# Patient Record
Sex: Male | Born: 1965
Health system: Southern US, Academic
[De-identification: ages and names within clinical notes are randomized; demographics above are authoritative.]

## PROBLEM LIST (undated history)

## (undated) ENCOUNTER — Encounter: Attending: Certified Registered" | Primary: Certified Registered"

## (undated) ENCOUNTER — Ambulatory Visit: Payer: PRIVATE HEALTH INSURANCE

## (undated) ENCOUNTER — Ambulatory Visit
Payer: BLUE CROSS/BLUE SHIELD | Attending: Student in an Organized Health Care Education/Training Program | Primary: Student in an Organized Health Care Education/Training Program

## (undated) ENCOUNTER — Ambulatory Visit

## (undated) ENCOUNTER — Telehealth: Attending: Certified Registered" | Primary: Certified Registered"

## (undated) ENCOUNTER — Telehealth

## (undated) ENCOUNTER — Ambulatory Visit: Payer: BLUE CROSS/BLUE SHIELD

## (undated) ENCOUNTER — Encounter: Attending: Infectious Disease | Primary: Infectious Disease

## (undated) ENCOUNTER — Encounter

## (undated) ENCOUNTER — Other Ambulatory Visit

## (undated) ENCOUNTER — Encounter
Attending: Student in an Organized Health Care Education/Training Program | Primary: Student in an Organized Health Care Education/Training Program

## (undated) ENCOUNTER — Ambulatory Visit: Payer: PRIVATE HEALTH INSURANCE | Attending: Family | Primary: Family

## (undated) ENCOUNTER — Encounter: Attending: Internal Medicine | Primary: Internal Medicine

## (undated) ENCOUNTER — Encounter: Attending: Gastroenterology | Primary: Gastroenterology

## (undated) ENCOUNTER — Encounter: Attending: Nephrology | Primary: Nephrology

## (undated) ENCOUNTER — Encounter: Attending: Registered" | Primary: Registered"

## (undated) ENCOUNTER — Ambulatory Visit
Payer: PRIVATE HEALTH INSURANCE | Attending: Student in an Organized Health Care Education/Training Program | Primary: Student in an Organized Health Care Education/Training Program

## (undated) ENCOUNTER — Telehealth: Attending: Vascular & Interventional Radiology | Primary: Vascular & Interventional Radiology

## (undated) ENCOUNTER — Telehealth: Payer: PRIVATE HEALTH INSURANCE

## (undated) ENCOUNTER — Encounter: Attending: Family | Primary: Family

## (undated) ENCOUNTER — Ambulatory Visit: Payer: BLUE CROSS/BLUE SHIELD | Attending: Podiatrist | Primary: Podiatrist

## (undated) ENCOUNTER — Ambulatory Visit: Payer: MEDICAID

## (undated) ENCOUNTER — Telehealth: Attending: Registered" | Primary: Registered"

## (undated) ENCOUNTER — Ambulatory Visit: Payer: BLUE CROSS/BLUE SHIELD | Attending: "Endocrinology | Primary: "Endocrinology

## (undated) ENCOUNTER — Inpatient Hospital Stay: Payer: BLUE CROSS/BLUE SHIELD

## (undated) ENCOUNTER — Encounter
Attending: Pharmacist Clinician (PhC)/ Clinical Pharmacy Specialist | Primary: Pharmacist Clinician (PhC)/ Clinical Pharmacy Specialist

## (undated) ENCOUNTER — Telehealth
Attending: Student in an Organized Health Care Education/Training Program | Primary: Student in an Organized Health Care Education/Training Program

## (undated) ENCOUNTER — Encounter: Payer: MEDICAID | Attending: Gastroenterology | Primary: Gastroenterology

## (undated) ENCOUNTER — Telehealth: Attending: Nephrology | Primary: Nephrology

## (undated) ENCOUNTER — Telehealth: Attending: Internal Medicine | Primary: Internal Medicine

## (undated) ENCOUNTER — Encounter: Attending: Podiatrist | Primary: Podiatrist

## (undated) ENCOUNTER — Ambulatory Visit: Payer: PRIVATE HEALTH INSURANCE | Attending: Nephrology | Primary: Nephrology

## (undated) ENCOUNTER — Encounter: Attending: Pharmacist | Primary: Pharmacist

## (undated) ENCOUNTER — Telehealth: Attending: Family | Primary: Family

## (undated) ENCOUNTER — Ambulatory Visit
Attending: Student in an Organized Health Care Education/Training Program | Primary: Student in an Organized Health Care Education/Training Program

## (undated) ENCOUNTER — Ambulatory Visit: Attending: Dermatology | Primary: Dermatology

## (undated) ENCOUNTER — Encounter: Attending: Surgery | Primary: Surgery

## (undated) ENCOUNTER — Ambulatory Visit: Payer: PRIVATE HEALTH INSURANCE | Attending: Pharmacist | Primary: Pharmacist

## (undated) DIAGNOSIS — I10 Essential (primary) hypertension: Secondary | ICD-10-CM

## (undated) DIAGNOSIS — N189 Chronic kidney disease, unspecified: Secondary | ICD-10-CM

## (undated) DIAGNOSIS — K7460 Unspecified cirrhosis of liver: Secondary | ICD-10-CM

## (undated) DIAGNOSIS — R011 Cardiac murmur, unspecified: Secondary | ICD-10-CM

## (undated) DIAGNOSIS — A419 Sepsis, unspecified organism: Secondary | ICD-10-CM

## (undated) DIAGNOSIS — G4730 Sleep apnea, unspecified: Secondary | ICD-10-CM

## (undated) HISTORY — PX: LIVER TRANSPLANT: SHX410

## (undated) HISTORY — PX: NO PAST SURGERIES: SHX2092

## (undated) MED ORDER — TESTOSTERONE 20.25 MG/1.25 GRAM (1.62 %) TRANSDERMAL GEL PUMP: 41 mg | g | Freq: Every day | 2 refills | 30 days

## (undated) MED ORDER — TESTOSTERONE 20.25 MG/1.25 GRAM (1.62 %) TRANSDERMAL GEL PUMP: 41 mg | g | Freq: Every day | 2 refills | 30 days | Status: CN

## (undated) MED ORDER — MYCOPHENOLATE SODIUM 180 MG TABLET,DELAYED RELEASE: 180 mg | tablet | Freq: Two times a day (BID) | 11 refills | 30 days | Status: CN

## (undated) MED ORDER — NOVOLOG FLEXPEN SUBQ: Freq: Three times a day (TID) | SUBCUTANEOUS | 0 days

## (undated) MED ORDER — MULTIVITAMIN TABLET: Freq: Every day | ORAL | 0 days

---
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
## 2008-09-07 ENCOUNTER — Ambulatory Visit: Payer: Self-pay | Admitting: Occupational Medicine

## 2008-12-07 ENCOUNTER — Ambulatory Visit: Payer: Self-pay | Admitting: Occupational Medicine

## 2008-12-07 DIAGNOSIS — L02419 Cutaneous abscess of limb, unspecified: Secondary | ICD-10-CM | POA: Insufficient documentation

## 2008-12-07 DIAGNOSIS — L03119 Cellulitis of unspecified part of limb: Secondary | ICD-10-CM

## 2008-12-07 DIAGNOSIS — K7460 Unspecified cirrhosis of liver: Secondary | ICD-10-CM | POA: Insufficient documentation

## 2008-12-07 LAB — CONVERTED CEMR LAB
Basophils Absolute: 0 10*3/uL (ref 0.0–0.1)
Basophils Relative: 0 % (ref 0–1)
Glucose, Urine, Semiquant: NEGATIVE
Ketones, urine, test strip: NEGATIVE
Lymphocytes Relative: 16 % (ref 12–46)
MCHC: 34.2 g/dL (ref 30.0–36.0)
Neutro Abs: 6.9 10*3/uL (ref 1.7–7.7)
Neutrophils Relative %: 83 % — ABNORMAL HIGH (ref 43–77)
Nitrite: NEGATIVE
RDW: 14.1 % (ref 11.5–15.5)
Specific Gravity, Urine: 1.025
WBC Urine, dipstick: NEGATIVE
pH: 7

## 2011-09-18 ENCOUNTER — Ambulatory Visit (INDEPENDENT_AMBULATORY_CARE_PROVIDER_SITE_OTHER): Payer: BC Managed Care – PPO | Admitting: Gastroenterology

## 2011-09-18 DIAGNOSIS — K7460 Unspecified cirrhosis of liver: Secondary | ICD-10-CM

## 2011-09-18 DIAGNOSIS — K7290 Hepatic failure, unspecified without coma: Secondary | ICD-10-CM

## 2011-09-18 DIAGNOSIS — K7682 Hepatic encephalopathy: Secondary | ICD-10-CM

## 2011-09-18 DIAGNOSIS — R945 Abnormal results of liver function studies: Secondary | ICD-10-CM

## 2011-09-18 DIAGNOSIS — R188 Other ascites: Secondary | ICD-10-CM

## 2011-09-18 DIAGNOSIS — K766 Portal hypertension: Secondary | ICD-10-CM

## 2011-10-16 ENCOUNTER — Ambulatory Visit (INDEPENDENT_AMBULATORY_CARE_PROVIDER_SITE_OTHER): Payer: BC Managed Care – PPO | Admitting: Gastroenterology

## 2011-10-16 DIAGNOSIS — K766 Portal hypertension: Secondary | ICD-10-CM

## 2011-10-16 DIAGNOSIS — R945 Abnormal results of liver function studies: Secondary | ICD-10-CM

## 2011-10-16 DIAGNOSIS — R188 Other ascites: Secondary | ICD-10-CM

## 2011-10-16 DIAGNOSIS — K7689 Other specified diseases of liver: Secondary | ICD-10-CM

## 2011-12-14 ENCOUNTER — Other Ambulatory Visit (HOSPITAL_COMMUNITY): Payer: Self-pay | Admitting: *Deleted

## 2011-12-18 ENCOUNTER — Encounter (HOSPITAL_COMMUNITY): Payer: Self-pay

## 2011-12-18 ENCOUNTER — Encounter (HOSPITAL_COMMUNITY)
Admission: RE | Admit: 2011-12-18 | Discharge: 2011-12-18 | Disposition: A | Discharge status: Home / Self Care | Payer: BC Managed Care – PPO | Source: Ambulatory Visit | Attending: Gastroenterology | Admitting: Gastroenterology

## 2011-12-18 DIAGNOSIS — K769 Liver disease, unspecified: Secondary | ICD-10-CM | POA: Insufficient documentation

## 2011-12-18 HISTORY — DX: Cardiac murmur, unspecified: R01.1

## 2011-12-18 HISTORY — DX: Sleep apnea, unspecified: G47.30

## 2011-12-18 HISTORY — DX: Unspecified cirrhosis of liver: K74.60

## 2011-12-18 HISTORY — DX: Essential (primary) hypertension: I10

## 2011-12-18 LAB — ABO/RH: ABO/RH(D): O NEG

## 2011-12-18 MED ORDER — SODIUM CHLORIDE 0.9 % IV SOLN
Freq: Once | INTRAVENOUS | Status: AC
  Administered 2011-12-18: 08:00:00 via INTRAVENOUS

## 2011-12-18 NOTE — Progress Notes (Signed)
3rd unit of FFP started donor number [id number]. Verified by Max Fickle RN. New y set added to IV.

## 2011-12-18 NOTE — Progress Notes (Signed)
Rate of ffp increased to 200cc

## 2011-12-18 NOTE — Discharge Instructions (Signed)
Refer to Blood Transfusion information sheet. Call your doctor for any problems or questions.

## 2011-12-18 NOTE — Progress Notes (Signed)
FFP started at 50cc/hr for 12.5cc.

## 2011-12-18 NOTE — Progress Notes (Signed)
Have been in contact with dentist office all day to co-ordinate time for extraction that has to be done today. Must be there by 4pm today for procedure. Will stop FFP to allow patient to get to facility .

## 2011-12-18 NOTE — Progress Notes (Signed)
Patient had to be in [place] at [facility]ist at [facility] no choice but to stop FFP .

## 2011-12-19 LAB — PREPARE FRESH FROZEN PLASMA
Unit division: 0
Unit division: 0

## 2015-02-20 ENCOUNTER — Emergency Department (HOSPITAL_COMMUNITY): Payer: BLUE CROSS/BLUE SHIELD

## 2015-02-20 ENCOUNTER — Emergency Department (HOSPITAL_COMMUNITY)
Admission: EM | Admit: 2015-02-20 | Discharge: 2015-02-20 | Disposition: A | Discharge status: Home / Self Care | Payer: BLUE CROSS/BLUE SHIELD | Attending: Emergency Medicine | Admitting: Emergency Medicine

## 2015-02-20 ENCOUNTER — Encounter (HOSPITAL_COMMUNITY): Payer: Self-pay | Admitting: Emergency Medicine

## 2015-02-20 ENCOUNTER — Other Ambulatory Visit: Payer: Self-pay

## 2015-02-20 DIAGNOSIS — R0602 Shortness of breath: Secondary | ICD-10-CM | POA: Insufficient documentation

## 2015-02-20 DIAGNOSIS — Z79899 Other long term (current) drug therapy: Secondary | ICD-10-CM | POA: Insufficient documentation

## 2015-02-20 DIAGNOSIS — Z8719 Personal history of other diseases of the digestive system: Secondary | ICD-10-CM | POA: Insufficient documentation

## 2015-02-20 DIAGNOSIS — M79601 Pain in right arm: Secondary | ICD-10-CM | POA: Diagnosis not present

## 2015-02-20 DIAGNOSIS — Z7982 Long term (current) use of aspirin: Secondary | ICD-10-CM | POA: Insufficient documentation

## 2015-02-20 DIAGNOSIS — M25521 Pain in right elbow: Secondary | ICD-10-CM | POA: Diagnosis not present

## 2015-02-20 DIAGNOSIS — Z8669 Personal history of other diseases of the nervous system and sense organs: Secondary | ICD-10-CM | POA: Insufficient documentation

## 2015-02-20 DIAGNOSIS — I10 Essential (primary) hypertension: Secondary | ICD-10-CM | POA: Insufficient documentation

## 2015-02-20 DIAGNOSIS — R011 Cardiac murmur, unspecified: Secondary | ICD-10-CM | POA: Diagnosis not present

## 2015-02-20 LAB — BASIC METABOLIC PANEL
ANION GAP: 7 (ref 5–15)
BUN: 19 mg/dL (ref 6–20)
CHLORIDE: 107 mmol/L (ref 101–111)
CO2: 24 mmol/L (ref 22–32)
Calcium: 8.7 mg/dL — ABNORMAL LOW (ref 8.9–10.3)
Creatinine, Ser: 1.33 mg/dL — ABNORMAL HIGH (ref 0.61–1.24)
GFR calc Af Amer: >60 mL/min (ref >60)
GLUCOSE: 133 mg/dL — AB (ref 65–99)
POTASSIUM: 4 mmol/L (ref 3.5–5.1)
Sodium: 138 mmol/L (ref 135–145)

## 2015-02-20 LAB — CBC WITH DIFFERENTIAL/PLATELET
BASOS ABS: 0 10*3/uL (ref 0.0–0.1)
Basophils Relative: 0 % (ref 0–1)
EOS PCT: 1 % (ref 0–5)
Eosinophils Absolute: 0.1 10*3/uL (ref 0.0–0.7)
HEMATOCRIT: 46.3 % (ref 39.0–52.0)
HEMOGLOBIN: 15.4 g/dL (ref 13.0–17.0)
LYMPHS ABS: 1 10*3/uL (ref 0.7–4.0)
LYMPHS PCT: 11 % — AB (ref 12–46)
MCH: 27.4 pg (ref 26.0–34.0)
MCHC: 33.3 g/dL (ref 30.0–36.0)
MCV: 82.2 fL (ref 78.0–100.0)
Monocytes Absolute: 0.6 10*3/uL (ref 0.1–1.0)
Monocytes Relative: 7 % (ref 3–12)
NEUTROS ABS: 7.4 10*3/uL (ref 1.7–7.7)
Neutrophils Relative %: 81 % — ABNORMAL HIGH (ref 43–77)
PLATELETS: 193 10*3/uL (ref 150–400)
RBC: 5.63 MIL/uL (ref 4.22–5.81)
RDW: 13.5 % (ref 11.5–15.5)
WBC: 9.2 10*3/uL (ref 4.0–10.5)

## 2015-02-20 MED ORDER — OXYCODONE-ACETAMINOPHEN 5-325 MG PO TABS
1.0000 | ORAL_TABLET | Freq: Once | ORAL | Status: AC
  Administered 2015-02-20: 1 via ORAL
  Filled 2015-02-20: qty 1

## 2015-02-20 NOTE — ED Provider Notes (Signed)
CSN: [id number]     Arrival date & time 02/20/15  1514 History   First MD Initiated Contact with Patient 02/20/15 1654     Chief Complaint  Patient presents with  . Arm Pain  . Shortness of Breath     (Consider location/radiation/quality/duration/timing/severity/associated sxs/prior Treatment) HPI 49 year old male who presents with right elbow pain. Started suddenly while at rest around 12AM, progressively worse. Denies associating trauma or exertional activity. Denies associated swelling to arm. Noticed that he was breathing more heavy with the pain, but does not truly have SOB (as stated in the CC). Denies chest pain, fevers, chills, nausea, vomiting, abdominal pain, diarrhea, or urinary complaints. No associating numbness or weakness.  Past Medical History  Diagnosis Date  . Hypertension   . Heart murmur   . Sleep apnea   . Cirrhosis    Past Surgical History  Procedure Laterality Date  . No past surgeries    . Liver transplant     History reviewed. No pertinent family history. Social History  Substance Use Topics  . Smoking status: Never Smoker   . Smokeless tobacco: None     Comment: quit smoking 2002  . Alcohol Use: No    Review of Systems 10/14 systems reviewed and are negative other than those stated in the HPI  Allergies  Review of patient's allergies indicates no known allergies.  Home Medications   Prior to Admission medications   Medication Sig Start Date End Date Taking? Authorizing Provider  aspirin EC 81 MG tablet Take 81 mg by mouth daily.   Yes Historical Provider, MD  Cholecalciferol (VITAMIN D) 2000 UNITS tablet Take 2,000 Units by mouth daily.   Yes Historical Provider, MD  mycophenolate (MYFORTIC) 180 MG EC tablet Take 360 mg by mouth 2 (two) times daily.   Yes Historical Provider, MD  tacrolimus (PROGRAF) 0.5 MG capsule Take 0.5-1 mg by mouth 2 (two) times daily. Takes 1mg  in the morning and 0.5mg  at night   Yes Historical Provider, MD   BP  166/83 mmHg  Pulse 76  Temp(Src) 98.6 F (37 C) (Oral)  Resp 18  SpO2 93% Physical Exam Physical Exam  Nursing note and vitals reviewed. Constitutional: Well developed, well nourished, non-toxic, and in no acute distress Head: Normocephalic and atraumatic.  Mouth/Throat: Oropharynx is clear and moist.  Neck: Normal range of motion. Neck supple.  Cardiovascular: Normal rate and regular rhythm.  No edema. +2 radial pulses bilaterally Pulmonary/Chest: Effort normal and breath sounds normal. No chest wall tenderness. Abdominal: Soft. There is no tenderness. There is no rebound and no guarding.  Musculoskeletal: Normal appearance of the right elbow, with overlying dry skin. Tender to palpation of the right elbow. No swelling, deformity, or overlying redness. Neurological: Alert, no facial droop, fluent speech, moves all extremities symmetrically. In tact sensation and motor of the radial, ulnar, and median nerves of the RUE Skin: Skin is warm and dry.  Psychiatric: Cooperative  ED Course  Procedures (including critical care time) Labs Review Labs Reviewed  CBC WITH DIFFERENTIAL/PLATELET - Abnormal; Notable for the following:    Neutrophils Relative % 81 (*)    Lymphocytes Relative 11 (*)    All other components within normal limits  BASIC METABOLIC PANEL - Abnormal; Notable for the following:    Glucose, Bld 133 (*)    Creatinine, Ser 1.33 (*)    Calcium 8.7 (*)    All other components within normal limits    Imaging Review Dg Chest 2  View  02/20/2015   CLINICAL DATA:  Severe right arm pain and shortness of breath for 1 day. Diabetes.  EXAM: CHEST  2 VIEW  COMPARISON:  11/02/2012  FINDINGS: Lateral view degraded by patient arm position. Midline trachea. Normal heart size and mediastinal contours.Sharp costophrenic angles. No pneumothorax. Calcified granuloma in the left lower lobe laterally. There may be lateral right upper lobe calcified granuloma as well.  IMPRESSION: No active  cardiopulmonary disease.   Electronically Signed   By: Abigail Miyamoto M.D.   On: 02/20/2015 16:17   Dg Elbow 2 Views Right  02/20/2015   CLINICAL DATA:  He right posterior elbow pain. No known injury. Symptoms began today.  EXAM: RIGHT ELBOW - 2 VIEW  COMPARISON:  None.  FINDINGS: There is no evidence of fracture, dislocation, or joint effusion. There is no evidence of arthropathy or other focal bone abnormality. Soft tissues are unremarkable.  IMPRESSION: Negative.   Electronically Signed   By: Rolm Baptise M.D.   On: 02/20/2015 18:24   I have personally reviewed and evaluated these images and lab results as part of my medical decision-making.   EKG Interpretation   Date/Time:  Saturday February 20 2015 15:13:22 EDT Ventricular Rate:  86 PR Interval:  210 QRS Duration: 78 QT Interval:  350 QTC Calculation: 418 R Axis:   -31 Text Interpretation:  Sinus rhythm with 1st degree A-V block with Fusion  complexes Left axis deviation TWI in the lateral precordial leads. No  prior EKG for comparison Confirmed by Alvin Rubano MD, Lilleigh Hechavarria [phone]) on 02/21/2015  12:26:35 AM      MDM   Final diagnoses:  Elbow pain, right     in short, this a 49 year old male with history of liver transplant who presents with right elbow pain. He is nontoxic and in no acute distress. Vital signs within normal limits. On exam he is a normal-appearing elbow, but with exquisite tenderness to palpation. He has normal range of motion of this joint,  And there is no evidence of swelling or overlying skin changes that would  Raise suspicion for infection. Extremity is neurovascularly intact distally. X-rays performed showing no acute processes. Although triage note documents that he has shortness of breath, he denies this is me and just reports that with the pain he was breathing more heavily. Given reproducible elbow pain, I do not believe that this is an anginal equivalent. He was initially given Percocet for pain control, with  resolution of his symptoms. He is felt appropriate for discharge home, and will follow up with his primary care provider. Strict return follow-up instructions reviewed. He expressed understanding of all discharge instructions for comfortable to plan of care.    Forde Dandy, MD 02/21/15 [phone]

## 2015-02-20 NOTE — Discharge Instructions (Signed)
Return without fail for worsening symptoms, including redness/swelling of the elbow, fevers, or any other symptoms concerning to you. Continue taking motrin or tylenol for pain control. Follow-up with your PCP.   Musculoskeletal Pain Musculoskeletal pain is muscle and boney aches and pains. These pains can occur in any part of the body. Your caregiver may treat you without knowing the cause of the pain. They may treat you if blood or urine tests, X-rays, and other tests were normal.  CAUSES There is often not a definite cause or reason for these pains. These pains may be caused by a type of germ (virus). The discomfort may also come from overuse. Overuse includes working out too hard when your body is not fit. Boney aches also come from weather changes. Bone is sensitive to atmospheric pressure changes. HOME CARE INSTRUCTIONS   Ask when your test results will be ready. Make sure you get your test results.  Only take over-the-counter or prescription medicines for pain, discomfort, or fever as directed by your caregiver. If you were given medications for your condition, do not drive, operate machinery or power tools, or sign legal documents for 24 hours. Do not drink alcohol. Do not take sleeping pills or other medications that may interfere with treatment.  Continue all activities unless the activities cause more pain. When the pain lessens, slowly resume normal activities. Gradually increase the intensity and duration of the activities or exercise.  During periods of severe pain, bed rest may be helpful. Lay or sit in any position that is comfortable.  Putting ice on the injured area.  Put ice in a bag.  Place a towel between your skin and the bag.  Leave the ice on for 15 to 20 minutes, 3 to 4 times a day.  Follow up with your caregiver for continued problems and no reason can be found for the pain. If the pain becomes worse or does not go away, it may be necessary to repeat tests or do  additional testing. Your caregiver may need to look further for a possible cause. SEEK IMMEDIATE MEDICAL CARE IF:  You have pain that is getting worse and is not relieved by medications.  You develop chest pain that is associated with shortness or breath, sweating, feeling sick to your stomach (nauseous), or throw up (vomit).  Your pain becomes localized to the abdomen.  You develop any new symptoms that seem different or that concern you. MAKE SURE YOU:   Understand these instructions.  Will watch your condition.  Will get help right away if you are not doing well or get worse. Document Released: 06/12/2005 Document Revised: 09/04/2011 Document Reviewed: 02/14/2013 [facility] Patient Information 2015 [place], [place]. This information is not intended to replace advice given to you by your health care provider. Make sure you discuss any questions you have with your health care provider.

## 2015-02-20 NOTE — ED Notes (Signed)
Pt refused w/c upon d/c. Ambulatory upon d/c in NAD, steady gait.

## 2015-02-20 NOTE — ED Notes (Signed)
Pt states that about an hour ago started having pain in right elbow that radiates down to right hand.  Pt states that he also having shob.  Pt states that he had liver transplant 3 years ago.

## 2015-07-09 ENCOUNTER — Encounter (HOSPITAL_COMMUNITY): Payer: Self-pay | Admitting: Emergency Medicine

## 2015-07-09 ENCOUNTER — Emergency Department (HOSPITAL_COMMUNITY)
Admission: EM | Admit: 2015-07-09 | Discharge: 2015-07-09 | Payer: BLUE CROSS/BLUE SHIELD | Attending: Emergency Medicine | Admitting: Emergency Medicine

## 2015-07-09 ENCOUNTER — Emergency Department (HOSPITAL_COMMUNITY): Payer: BLUE CROSS/BLUE SHIELD

## 2015-07-09 DIAGNOSIS — Z7982 Long term (current) use of aspirin: Secondary | ICD-10-CM | POA: Insufficient documentation

## 2015-07-09 DIAGNOSIS — R112 Nausea with vomiting, unspecified: Secondary | ICD-10-CM | POA: Insufficient documentation

## 2015-07-09 DIAGNOSIS — R000 Tachycardia, unspecified: Secondary | ICD-10-CM | POA: Diagnosis not present

## 2015-07-09 DIAGNOSIS — R1010 Upper abdominal pain, unspecified: Secondary | ICD-10-CM | POA: Diagnosis present

## 2015-07-09 DIAGNOSIS — Z794 Long term (current) use of insulin: Secondary | ICD-10-CM | POA: Insufficient documentation

## 2015-07-09 DIAGNOSIS — Z79899 Other long term (current) drug therapy: Secondary | ICD-10-CM | POA: Insufficient documentation

## 2015-07-09 DIAGNOSIS — R079 Chest pain, unspecified: Secondary | ICD-10-CM | POA: Diagnosis not present

## 2015-07-09 DIAGNOSIS — M549 Dorsalgia, unspecified: Secondary | ICD-10-CM | POA: Insufficient documentation

## 2015-07-09 DIAGNOSIS — Z944 Liver transplant status: Secondary | ICD-10-CM

## 2015-07-09 DIAGNOSIS — Z7984 Long term (current) use of oral hypoglycemic drugs: Secondary | ICD-10-CM | POA: Diagnosis not present

## 2015-07-09 DIAGNOSIS — R509 Fever, unspecified: Secondary | ICD-10-CM | POA: Diagnosis not present

## 2015-07-09 HISTORY — DX: Sepsis, unspecified organism: A41.9

## 2015-07-09 LAB — URINE MICROSCOPIC-ADD ON

## 2015-07-09 LAB — CBC
HCT: 53.3 % — ABNORMAL HIGH (ref 39.0–52.0)
HEMOGLOBIN: 18.4 g/dL — AB (ref 13.0–17.0)
MCH: 28 pg (ref 26.0–34.0)
MCHC: 34.5 g/dL (ref 30.0–36.0)
MCV: 81.1 fL (ref 78.0–100.0)
Platelets: 177 10*3/uL (ref 150–400)
RBC: 6.57 MIL/uL — AB (ref 4.22–5.81)
RDW: 13 % (ref 11.5–15.5)
WBC: 11.2 10*3/uL — ABNORMAL HIGH (ref 4.0–10.5)

## 2015-07-09 LAB — COMPREHENSIVE METABOLIC PANEL
ALBUMIN: 4.5 g/dL (ref 3.5–5.0)
ALT: 39 U/L (ref 17–63)
ANION GAP: 12 (ref 5–15)
AST: 32 U/L (ref 15–41)
Alkaline Phosphatase: 87 U/L (ref 38–126)
BUN: 28 mg/dL — ABNORMAL HIGH (ref 6–20)
CHLORIDE: 105 mmol/L (ref 101–111)
CO2: 22 mmol/L (ref 22–32)
CREATININE: 1.47 mg/dL — AB (ref 0.61–1.24)
Calcium: 9.6 mg/dL (ref 8.9–10.3)
GFR calc non Af Amer: 55 mL/min — ABNORMAL LOW (ref >60)
GLUCOSE: 238 mg/dL — AB (ref 65–99)
Potassium: 4.5 mmol/L (ref 3.5–5.1)
SODIUM: 139 mmol/L (ref 135–145)
Total Bilirubin: 1.5 mg/dL — ABNORMAL HIGH (ref 0.3–1.2)
Total Protein: 8 g/dL (ref 6.5–8.1)

## 2015-07-09 LAB — INFLUENZA PANEL BY PCR (TYPE A & B)
H1N1 flu by pcr: NOT DETECTED
INFLAPCR: NEGATIVE
INFLBPCR: NEGATIVE

## 2015-07-09 LAB — URINALYSIS, ROUTINE W REFLEX MICROSCOPIC
Bilirubin Urine: NEGATIVE
GLUCOSE, UA: 500 mg/dL — AB
Ketones, ur: NEGATIVE mg/dL
Leukocytes, UA: NEGATIVE
Nitrite: NEGATIVE
Protein, ur: 100 mg/dL — AB
SPECIFIC GRAVITY, URINE: 1.031 — AB (ref 1.005–1.030)
pH: 5.5 (ref 5.0–8.0)

## 2015-07-09 LAB — I-STAT CG4 LACTIC ACID, ED: LACTIC ACID, VENOUS: 2.68 mmol/L — AB (ref 0.5–2.0)

## 2015-07-09 LAB — I-STAT TROPONIN, ED
Troponin i, poc: 0.03 ng/mL (ref 0.00–0.08)
Troponin i, poc: 0.05 ng/mL (ref 0.00–0.08)

## 2015-07-09 LAB — CBG MONITORING, ED: GLUCOSE-CAPILLARY: 159 mg/dL — AB (ref 65–99)

## 2015-07-09 LAB — LIPASE, BLOOD: Lipase: 23 U/L (ref 11–51)

## 2015-07-09 MED ORDER — HYDROMORPHONE HCL 1 MG/ML IJ SOLN
1.0000 mg | Freq: Once | INTRAMUSCULAR | Status: AC
  Administered 2015-07-09: 1 mg via INTRAVENOUS
  Filled 2015-07-09: qty 1

## 2015-07-09 MED ORDER — ONDANSETRON HCL 4 MG/2ML IJ SOLN
4.0000 mg | Freq: Once | INTRAMUSCULAR | Status: AC
  Administered 2015-07-09: 4 mg via INTRAVENOUS
  Filled 2015-07-09: qty 2

## 2015-07-09 MED ORDER — IOHEXOL 300 MG/ML  SOLN
100.0000 mL | Freq: Once | INTRAMUSCULAR | Status: AC | PRN
  Administered 2015-07-09: 100 mL via INTRAVENOUS

## 2015-07-09 MED ORDER — VANCOMYCIN HCL 10 G IV SOLR
1500.0000 mg | Freq: Once | INTRAVENOUS | Status: AC
  Administered 2015-07-09: 1500 mg via INTRAVENOUS
  Filled 2015-07-09: qty 1500

## 2015-07-09 MED ORDER — VANCOMYCIN HCL IN DEXTROSE 1-5 GM/200ML-% IV SOLN: 1000.0000 mg | Freq: Two times a day (BID) | INTRAVENOUS | Status: DC

## 2015-07-09 MED ORDER — VANCOMYCIN HCL IN DEXTROSE 1-5 GM/200ML-% IV SOLN
1000.0000 mg | INTRAVENOUS | Status: AC
  Administered 2015-07-09: 1000 mg via INTRAVENOUS
  Filled 2015-07-09: qty 200

## 2015-07-09 MED ORDER — PIPERACILLIN-TAZOBACTAM 3.375 G IVPB: 3.3750 g | Freq: Three times a day (TID) | INTRAVENOUS | Status: DC

## 2015-07-09 MED ORDER — SODIUM CHLORIDE 0.9 % IV BOLUS (SEPSIS)
1000.0000 mL | Freq: Once | INTRAVENOUS | Status: AC
  Administered 2015-07-09: 1000 mL via INTRAVENOUS

## 2015-07-09 MED ORDER — PIPERACILLIN-TAZOBACTAM 3.375 G IVPB 30 MIN
3.3750 g | INTRAVENOUS | Status: AC
  Administered 2015-07-09: 3.375 g via INTRAVENOUS
  Filled 2015-07-09: qty 50

## 2015-07-09 MED ORDER — IOHEXOL 300 MG/ML  SOLN: 25.0000 mL | Freq: Once | INTRAMUSCULAR | Status: DC | PRN

## 2015-07-09 MED ORDER — HYDROMORPHONE HCL 2 MG/ML IJ SOLN
2.0000 mg | Freq: Once | INTRAMUSCULAR | Status: AC
  Administered 2015-07-09: 2 mg via INTRAVENOUS
  Filled 2015-07-09: qty 1

## 2015-07-09 NOTE — ED Notes (Signed)
Per pt, states burning in chest and sever lower back pain that started yesterday

## 2015-07-09 NOTE — ED Notes (Signed)
[facility] here to transfer pt to [facility].

## 2015-07-09 NOTE — ED Notes (Signed)
[facility] called--- report on pt provided.

## 2015-07-09 NOTE — ED Provider Notes (Addendum)
CSN: [id number]     Arrival date & time 07/09/15  1028 History   First MD Initiated Contact with Patient 07/09/15 1205     Chief Complaint  Patient presents with  . Abdominal Pain  . Back Pain     (Consider location/radiation/quality/duration/timing/severity/associated sxs/prior Treatment) HPI  50 year old male presents with back pain, abdominal pain, and vomiting. All started this morning. Patient has a history of a liver transplant is followed by [facility]. Patient states that originally last night the pain seemed to come from his upper abdomen and radiate into his chest as a burning sensation. Now however he has more back pain that seems to radiate around to his abdomen bilaterally. No urinary symptoms. Vomited once with some streaks of blood. Has a history of varices in the past, no issues since his liver transplant. Patient is on mycophenolate and tacrolimus. No known fevers.   Past Medical History  Diagnosis Date  . Hypertension   . Heart murmur   . Sleep apnea   . Cirrhosis [facility])    Past Surgical History  Procedure Laterality Date  . No past surgeries    . Liver transplant     No family history on file. Social History  Substance Use Topics  . Smoking status: Never Smoker   . Smokeless tobacco: None     Comment: quit smoking 2002  . Alcohol Use: No    Review of Systems  Constitutional: Negative for fever.  Respiratory: Negative for shortness of breath.   Cardiovascular: Positive for chest pain.  Gastrointestinal: Positive for nausea, vomiting and abdominal pain. Negative for diarrhea.  Genitourinary: Negative for dysuria.  Musculoskeletal: Positive for back pain.  All other systems reviewed and are negative.     Allergies  Insulin aspart prot & aspart  Home Medications   Prior to Admission medications   Medication Sig Start Date End Date Taking? Authorizing Provider  aspirin EC 81 MG tablet Take 81 mg by mouth daily.   Yes Historical Provider, MD   Cholecalciferol (VITAMIN D) 2000 UNITS tablet Take 2,000 Units by mouth daily.   Yes Historical Provider, MD  glimepiride (AMARYL) 2 MG tablet Take 2 mg by mouth daily. 06/17/15  Yes Historical Provider, MD  HUMALOG KWIKPEN 100 UNIT/ML KiwkPen Inject 5-20 Units into the skin 3 (three) times daily with meals as needed (high blood sugar). High blood sugar levels : 120-140=5 units 145-170= 10 units 170-199= 15 unitsand if above 200 use 20 units 06/23/15  Yes Historical Provider, MD  lisinopril (PRINIVIL,ZESTRIL) 10 MG tablet Take 10 mg by mouth daily. 05/19/15  Yes Historical Provider, MD  mycophenolate (MYFORTIC) 180 MG EC tablet Take 360 mg by mouth 2 (two) times daily.   Yes Historical Provider, MD  tacrolimus (PROGRAF) 0.5 MG capsule Take 0.5-1 mg by mouth 2 (two) times daily. Takes 1mg  in the morning and 0.5mg  at night   Yes Historical Provider, MD  Testosterone (ANDROGEL PUMP) 20.25 MG/ACT (1.62%) GEL Apply 1 application topically daily. Use 2 pumps once daily and apply to skin 06/22/15  Yes Historical Provider, MD   BP 150/77 mmHg  Pulse 115  Temp(Src) 102.5 F (39.2 C) (Rectal)  Resp 30  SpO2 95% Physical Exam  Constitutional: He is oriented to person, place, and time. He appears well-developed and well-nourished.  HENT:  Head: Normocephalic and atraumatic.  Right Ear: External ear normal.  Left Ear: External ear normal.  Nose: Nose normal.  Eyes: Right eye exhibits no discharge. Left eye exhibits no discharge.  Neck: Neck supple.  Cardiovascular: Regular rhythm, normal heart sounds and intact distal pulses.  Tachycardia present.   Pulmonary/Chest: Effort normal and breath sounds normal.  Abdominal: Soft. There is tenderness (diffuse, worse in upper abdomen).  Musculoskeletal: He exhibits no edema.  Neurological: He is alert and oriented to person, place, and time.  Skin: Skin is warm and dry.  Nursing note and vitals reviewed.   ED Course  Procedures (including critical care  time) Labs Review Labs Reviewed  COMPREHENSIVE METABOLIC PANEL - Abnormal; Notable for the following:    Glucose, Bld 238 (*)    BUN 28 (*)    Creatinine, Ser 1.47 (*)    Total Bilirubin 1.5 (*)    GFR calc non Af Amer 55 (*)    All other components within normal limits  CBC - Abnormal; Notable for the following:    WBC 11.2 (*)    RBC 6.57 (*)    Hemoglobin 18.4 (*)    HCT 53.3 (*)    All other components within normal limits  URINALYSIS, ROUTINE W REFLEX MICROSCOPIC (NOT AT [facility]) - Abnormal; Notable for the following:    Color, Urine AMBER (*)    Specific Gravity, Urine 1.031 (*)    Glucose, UA 500 (*)    Hgb urine dipstick SMALL (*)    Protein, ur 100 (*)    All other components within normal limits  URINE MICROSCOPIC-ADD ON - Abnormal; Notable for the following:    Squamous Epithelial / LPF 0-5 (*)    Bacteria, UA FEW (*)    All other components within normal limits  I-STAT CG4 LACTIC ACID, ED - Abnormal; Notable for the following:    Lactic Acid, Venous 2.68 (*)    All other components within normal limits  CULTURE, BLOOD (ROUTINE X 2)  CULTURE, BLOOD (ROUTINE X 2)  LIPASE, BLOOD  INFLUENZA PANEL BY PCR (TYPE A & B, H1N1)  I-STAT TROPOININ, ED  I-STAT TROPOININ, ED    Imaging Review Ct Abdomen Pelvis W Contrast  07/09/2015  CLINICAL DATA:  Severe low back and abdominal pain today. History of cirrhosis and liver transplant. EXAM: CT ABDOMEN AND PELVIS WITH CONTRAST TECHNIQUE: Multidetector CT imaging of the abdomen and pelvis was performed using the standard protocol following bolus administration of intravenous contrast. CONTRAST:  148mL OMNIPAQUE IOHEXOL 300 MG/ML  SOLN COMPARISON:  11/02/2012 FINDINGS: Lower chest: The lung bases are clear of acute process. Calcified granulomas are noted bilaterally. No pleural effusion. The heart is normal in size. No pericardial effusion. Suspect an intraatrial lipoma on the first image. The distal esophagus is grossly normal.  Hepatobiliary: The transplanted liver has a slightly irregular contour but no lesions are identified. No intrahepatic biliary dilatation. Pancreas: No mass, inflammation or ductal dilatation. Spleen: Upper limits of normal in size. No focal lesions. Rim calcified splenic artery aneurysm noted near the splenic hilum. It measures 22 mm. No change since prior study. Adrenals/Urinary Tract: The adrenal glands and kidneys are unremarkable. Stomach/Bowel: The stomach, duodenum, small bowel and colon are unremarkable. No inflammatory changes, mass lesions or obstructive findings. The terminal ileum is normal. The appendix is normal. Vascular/Lymphatic: Extensive portal venous collateral vessels are noted likely related to prior cirrhosis. No mesenteric or retroperitoneal mass or adenopathy. The aorta is normal in caliber. The branch vessels are patent. Other: The bladder, prostate gland and seminal vesicles are unremarkable. No pelvic mass or adenopathy. No free pelvic fluid collections. No inguinal mass or adenopathy. Small periumbilical abdominal wall  hernia containing fat. Musculoskeletal: No significant bony findings. IMPRESSION: 1. Stable surgical changes from a liver transplant. No hepatic lesions or intrahepatic biliary dilatation. 2. Extensive portal venous collateral vessels. 3. No acute abdominal/pelvic findings, mass lesions or adenopathy. Electronically Signed   By: Marijo Sanes M.D.   On: 07/09/2015 15:01   Dg Chest Port 1 View  07/09/2015  CLINICAL DATA:  Shortness of breath with back pain and anxiety. EXAM: PORTABLE CHEST 1 VIEW COMPARISON:  02/20/2015 and 11/02/2012 FINDINGS: The lungs are adequately inflated without consolidation or effusion. Small calcified granuloma over the left CP angle unchanged. Cardiomediastinal silhouette and remainder of the exam is unchanged. IMPRESSION: No active disease. Electronically Signed   By: Marin Olp M.D.   On: 07/09/2015 13:22   I have personally reviewed and  evaluated these images and lab results as part of my medical decision-making.   EKG Interpretation   Date/Time:  Friday July 09 2015 10:57:33 EST Ventricular Rate:  132 PR Interval:  182 QRS Duration: 75 QT Interval:  261 QTC Calculation: 387 R Axis:   -12 Text Interpretation:  Sinus tachycardia Anteroseptal infarct, old Abnormal  T, consider ischemia, lateral leads Baseline wander in lead(s) V2 rate is  faster compared to Aug 2016 Confirmed by Vanessa Alesi  MD, [place] [phone]) on  07/09/2015 2:58:52 PM      MDM   Final diagnoses:  Fever in adult  Liver transplant status ([place])    Patient found to be febrile here with a rectal temp of 102.5. Unclear exactly where this is coming from given his nonspecific findings on exam. CT of abdomen shows no acute pathology. His back pain is nonfocal and not in the midline. Very low suspicion for a spinal emergency. Given vomiting, now diarrhea in the emergency department, and his pain, this could be viral in etiology. Discussed with [facility] hepatology, Dr. Marene Lenz, who would like patient transferred to the [facility] emergency department for likely admission. Recommends flu swab, no treatment unless positive. Discussed with ED attending at [facility], Dr. Ron Parker, who accepts patient in transfer.    Sherwood Gambler, MD 07/09/15 [id number]  Sherwood Gambler, MD 07/09/15 [phone]

## 2015-07-09 NOTE — ED Notes (Signed)
MD at bedside. 

## 2015-07-09 NOTE — ED Notes (Signed)
Patient transported to CT 

## 2015-07-09 NOTE — ED Notes (Signed)
Ginger ale given

## 2015-07-09 NOTE — ED Notes (Signed)
[facility] TRANSFER CENTER RESPONSE PERSON Yukari Flax CONTACT NUMBER [phone]  CHARGE RN [phone]- PLEASE CALL WHEN [facility] LEAVES WITH PT

## 2015-07-09 NOTE — Progress Notes (Signed)
ANTIBIOTIC CONSULT NOTE - INITIAL  Pharmacy Consult for vancomycin, Zosyn Indication: rule out sepsis  Allergies  Allergen Reactions  . Insulin Aspart Prot & Aspart Other (See Comments)    Burning on injection and skin reaction.      Patient Measurements: Height: 5\' 10"  (177.8 cm) Weight: 290 lb (131.543 kg) IBW/kg (Calculated) : 73  Vital Signs: Temp: 102.5 F (39.2 C) (01/13 1222) Temp Source: Rectal (01/13 1222) BP: 150/77 mmHg (01/13 1213) Pulse Rate: 115 (01/13 1213) Intake/Output from previous day:   Intake/Output from this shift:    Labs:  Recent Labs  07/09/15 1126  WBC 11.2*  HGB 18.4*  PLT 177  CREATININE 1.47*   Estimated Creatinine Clearance: 84.7 mL/min (by C-G formula based on Cr of 1.47). No results for input(s): VANCOTROUGH, VANCOPEAK, VANCORANDOM, GENTTROUGH, GENTPEAK, GENTRANDOM, TOBRATROUGH, TOBRAPEAK, TOBRARND, AMIKACINPEAK, AMIKACINTROU, AMIKACIN in the last 72 hours.   Microbiology: No results found for this or any previous visit (from the past 720 hour(s)).  Medical History: Past Medical History  Diagnosis Date  . Hypertension   . Heart murmur   . Sleep apnea   . Cirrhosis ([place])     Medications:  Scheduled:  .  HYDROmorphone (DILAUDID) injection  1 mg Intravenous Once  . ondansetron (ZOFRAN) IV  4 mg Intravenous Once   Infusions:  . piperacillin-tazobactam    . sodium chloride    . sodium chloride    . vancomycin     Assessment: 50 yo male presents to ER with CC burning in chest and severe lower back and abdominal pain. PMH includes cirrhosis s/p liver transplant currently on antirejection meds Prograf and Myfortic. To start vancomycin and Zosyn for rule out sepsis. Patient febrile, slightly elevated WBC and SCr 1.47 with estimated CrCl N of 63  Goal of Therapy:  Vancomycin trough level 15-20 mcg/ml  Plan:  1) Vancomycin 1g IV x 1 then 1500mg  IV x 1 to = 2500mg  total loading dose. Thereafter, start vancomycin 1g IV q12 per  current weight and renal function 2) Zosyn 3.375g IV q8 (extended interval infusion) for CrCl > 20 ml/min   Adrian Saran, PharmD, BCPS Pager [phone] 07/09/2015 1:15 PM

## 2015-07-09 NOTE — ED Notes (Signed)
RN starting IV -  Will draw labs and 1st set of cultures, will draw 2nd set after portable x-ray is complete. (X-ray waiting outside door for RN to finish IV)

## 2015-07-09 NOTE — ED Notes (Signed)
Notified [place] - Lactic 2.68

## 2015-07-09 NOTE — ED Notes (Signed)
MD at bedside. EDP [place] PRESENT

## 2015-07-09 NOTE — ED Notes (Signed)
Oral contrast complete 

## 2015-07-14 LAB — CULTURE, BLOOD (ROUTINE X 2)
CULTURE: NO GROWTH
CULTURE: NO GROWTH

## 2015-09-15 ENCOUNTER — Encounter (HOSPITAL_BASED_OUTPATIENT_CLINIC_OR_DEPARTMENT_OTHER): Payer: Self-pay | Admitting: Emergency Medicine

## 2015-09-15 ENCOUNTER — Emergency Department (HOSPITAL_BASED_OUTPATIENT_CLINIC_OR_DEPARTMENT_OTHER)
Admission: EM | Admit: 2015-09-15 | Discharge: 2015-09-15 | Disposition: A | Discharge status: Home / Self Care | Payer: BLUE CROSS/BLUE SHIELD | Attending: Emergency Medicine | Admitting: Emergency Medicine

## 2015-09-15 ENCOUNTER — Emergency Department (HOSPITAL_BASED_OUTPATIENT_CLINIC_OR_DEPARTMENT_OTHER): Payer: BLUE CROSS/BLUE SHIELD

## 2015-09-15 DIAGNOSIS — Z8719 Personal history of other diseases of the digestive system: Secondary | ICD-10-CM | POA: Insufficient documentation

## 2015-09-15 DIAGNOSIS — R21 Rash and other nonspecific skin eruption: Secondary | ICD-10-CM | POA: Diagnosis present

## 2015-09-15 DIAGNOSIS — Z79899 Other long term (current) drug therapy: Secondary | ICD-10-CM | POA: Diagnosis not present

## 2015-09-15 DIAGNOSIS — L03316 Cellulitis of umbilicus: Secondary | ICD-10-CM | POA: Insufficient documentation

## 2015-09-15 DIAGNOSIS — E119 Type 2 diabetes mellitus without complications: Secondary | ICD-10-CM | POA: Diagnosis not present

## 2015-09-15 DIAGNOSIS — Z8669 Personal history of other diseases of the nervous system and sense organs: Secondary | ICD-10-CM | POA: Diagnosis not present

## 2015-09-15 DIAGNOSIS — R011 Cardiac murmur, unspecified: Secondary | ICD-10-CM | POA: Diagnosis not present

## 2015-09-15 DIAGNOSIS — I10 Essential (primary) hypertension: Secondary | ICD-10-CM | POA: Insufficient documentation

## 2015-09-15 DIAGNOSIS — Z7982 Long term (current) use of aspirin: Secondary | ICD-10-CM | POA: Insufficient documentation

## 2015-09-15 LAB — COMPREHENSIVE METABOLIC PANEL
ALBUMIN: 4.2 g/dL (ref 3.5–5.0)
ALK PHOS: 78 U/L (ref 38–126)
ALT: 43 U/L (ref 17–63)
ANION GAP: 10 (ref 5–15)
AST: 32 U/L (ref 15–41)
BILIRUBIN TOTAL: 0.8 mg/dL (ref 0.3–1.2)
BUN: 35 mg/dL — AB (ref 6–20)
CALCIUM: 9.1 mg/dL (ref 8.9–10.3)
CO2: 24 mmol/L (ref 22–32)
CREATININE: 1.35 mg/dL — AB (ref 0.61–1.24)
Chloride: 100 mmol/L — ABNORMAL LOW (ref 101–111)
GFR calc Af Amer: >60 mL/min (ref >60)
GFR calc non Af Amer: >60 mL/min (ref >60)
GLUCOSE: 137 mg/dL — AB (ref 65–99)
Potassium: 4.2 mmol/L (ref 3.5–5.1)
Sodium: 134 mmol/L — ABNORMAL LOW (ref 135–145)
TOTAL PROTEIN: 7.9 g/dL (ref 6.5–8.1)

## 2015-09-15 LAB — CBC WITH DIFFERENTIAL/PLATELET
BASOS PCT: 0 %
Basophils Absolute: 0 10*3/uL (ref 0.0–0.1)
Eosinophils Absolute: 0.2 10*3/uL (ref 0.0–0.7)
Eosinophils Relative: 2 %
HEMATOCRIT: 50.5 % (ref 39.0–52.0)
HEMOGLOBIN: 17.1 g/dL — AB (ref 13.0–17.0)
LYMPHS ABS: 1.5 10*3/uL (ref 0.7–4.0)
Lymphocytes Relative: 22 %
MCH: 27.4 pg (ref 26.0–34.0)
MCHC: 33.9 g/dL (ref 30.0–36.0)
MCV: 80.8 fL (ref 78.0–100.0)
MONOS PCT: 7 %
Monocytes Absolute: 0.5 10*3/uL (ref 0.1–1.0)
NEUTROS ABS: 4.9 10*3/uL (ref 1.7–7.7)
NEUTROS PCT: 69 %
Platelets: 259 10*3/uL (ref 150–400)
RBC: 6.25 MIL/uL — AB (ref 4.22–5.81)
RDW: 13.7 % (ref 11.5–15.5)
WBC: 7.1 10*3/uL (ref 4.0–10.5)

## 2015-09-15 LAB — CBG MONITORING, ED: Glucose-Capillary: 141 mg/dL — ABNORMAL HIGH (ref 65–99)

## 2015-09-15 MED ORDER — HYDROCODONE-ACETAMINOPHEN 5-325 MG PO TABS
1.0000 | ORAL_TABLET | Freq: Once | ORAL | Status: AC
  Administered 2015-09-15: 1 via ORAL
  Filled 2015-09-15: qty 1

## 2015-09-15 MED ORDER — HYDROCODONE-ACETAMINOPHEN 5-325 MG PO TABS: 1.0000 | ORAL_TABLET | ORAL | Status: DC | PRN

## 2015-09-15 MED ORDER — CLINDAMYCIN HCL 150 MG PO CAPS
300.0000 mg | ORAL_CAPSULE | Freq: Once | ORAL | Status: AC
  Administered 2015-09-15: 300 mg via ORAL
  Filled 2015-09-15: qty 2

## 2015-09-15 MED ORDER — CLINDAMYCIN HCL 300 MG PO CAPS: 300.0000 mg | ORAL_CAPSULE | Freq: Four times a day (QID) | ORAL | Status: DC

## 2015-09-15 MED ORDER — IOPAMIDOL (ISOVUE-300) INJECTION 61%
100.0000 mL | Freq: Once | INTRAVENOUS | Status: AC | PRN
  Administered 2015-09-15: 100 mL via INTRAVENOUS

## 2015-09-15 NOTE — ED Notes (Signed)
Pt returned from CT °

## 2015-09-15 NOTE — ED Notes (Signed)
Pt requesting pain medication, Dr. Lita Mains aware and would like to wait for CT results

## 2015-09-15 NOTE — ED Notes (Signed)
Patient transported to CT 

## 2015-09-15 NOTE — ED Notes (Signed)
Pt verbalizes understanding of d/c instructions and denies any further needs at this time. 

## 2015-09-15 NOTE — ED Provider Notes (Signed)
CSN: [id number]     Arrival date & time 09/15/15  1823 History  By signing my name below, I, Wayne Parker, attest that this documentation has been prepared under the direction and in the presence of [name], MD. Electronically Signed: Stephania Parker, ED Scribe. 09/15/2015. 9:20 PM.     Chief Complaint  Patient presents with  . Rash   The history is provided by the patient. No language interpreter was used.   HPI Comments: Wayne Parker is a 50 y.o. male with a history of DM, hypertension, cirrhosis, sepsis, and liver transplant, who presents to the Emergency Department complaining of gradual-onset, constant, painful rash with drainage on his bellybutton that began 4-5 days ago. Patient states he has been cleaning the area constantly with soap and water, with no relief. This is a new problem. Patient denies a history of abscesses. Patient notes his glucose readings have been controlled lately, in the range of 90's and 100's. He denies fever, chills, nausea, or vomiting.    Past Medical History  Diagnosis Date  . Hypertension   . Heart murmur   . Sleep apnea   . Cirrhosis ([place])   . Sepsis [facility])    Past Surgical History  Procedure Laterality Date  . No past surgeries    . Liver transplant     History reviewed. No pertinent family history. Social History  Substance Use Topics  . Smoking status: Never Smoker   . Smokeless tobacco: Never Used     Comment: quit smoking 2002  . Alcohol Use: No    Review of Systems  Constitutional: Negative for fever and chills.  Gastrointestinal: Positive for abdominal pain. Negative for nausea and vomiting.  Musculoskeletal: Negative for myalgias and back pain.  Skin: Positive for rash.  Neurological: Negative for dizziness, weakness, light-headedness, numbness and headaches.  All other systems reviewed and are negative.   Allergies  Insulin aspart prot & aspart  Home Medications   Prior to Admission medications   Medication Sig Start  Date End Date Taking? Authorizing Provider  aspirin EC 81 MG tablet Take 81 mg by mouth daily.    Historical Provider, MD  Cholecalciferol (VITAMIN D) 2000 UNITS tablet Take 2,000 Units by mouth daily.    Historical Provider, MD  clindamycin (CLEOCIN) 300 MG capsule Take 1 capsule (300 mg total) by mouth 4 (four) times daily. X 7 days 09/15/15   [name], MD  glimepiride (AMARYL) 2 MG tablet Take 2 mg by mouth daily. 06/17/15   Historical Provider, MD  HUMALOG KWIKPEN 100 UNIT/ML KiwkPen Inject 5-20 Units into the skin 3 (three) times daily with meals as needed (high blood sugar). High blood sugar levels : 120-140=5 units 145-170= 10 units 170-199= 15 unitsand if above 200 use 20 units 06/23/15   Historical Provider, MD  HYDROcodone-acetaminophen (NORCO) 5-325 MG tablet Take 1-2 tablets by mouth every 4 (four) hours as needed for severe pain. 09/15/15   [name], MD  lisinopril (PRINIVIL,ZESTRIL) 10 MG tablet Take 10 mg by mouth daily. 05/19/15   Historical Provider, MD  mycophenolate (MYFORTIC) 180 MG EC tablet Take 360 mg by mouth 2 (two) times daily.    Historical Provider, MD  tacrolimus (PROGRAF) 0.5 MG capsule Take 0.5-1 mg by mouth 2 (two) times daily. Takes 1mg  in the morning and 0.5mg  at night    Historical Provider, MD  Testosterone (ANDROGEL PUMP) 20.25 MG/ACT (1.62%) GEL Apply 1 application topically daily. Use 2 pumps once daily and apply to skin 06/22/15  Historical Provider, MD   BP 121/78 mmHg  Pulse 67  Temp(Src) 98.1 F (36.7 C) (Oral)  Resp 18  Ht 5\' 8"  (1.727 m)  Wt 270 lb (122.471 kg)  BMI 41.06 kg/m2  SpO2 96% Physical Exam  Constitutional: He is oriented to person, place, and time. He appears well-developed and well-nourished. No distress.  HENT:  Head: Normocephalic and atraumatic.  Mouth/Throat: Oropharynx is clear and moist.  Eyes: Conjunctivae and EOM are normal. Pupils are equal, round, and reactive to light.  Neck: Normal range of motion. Neck  supple. No tracheal deviation present.  Cardiovascular: Normal rate and regular rhythm.   Pulmonary/Chest: Effort normal and breath sounds normal. No respiratory distress. He has no wheezes. He has no rales.  Abdominal: Soft. Bowel sounds are normal. He exhibits no distension and no mass. There is tenderness. There is no rebound and no guarding.  Patient has pustular and ulcerated rash around the umbilicus. There is dried yellow crust surrounding the opening. Patient with deep umbilicus and tenderness and fluctuance at the base. Unable to visualize due to abdominal girth.  Musculoskeletal: Normal range of motion. He exhibits no edema or tenderness.  Neurological: He is alert and oriented to person, place, and time.  Skin: Skin is warm and dry. No rash noted. No erythema.  Psychiatric: He has a normal mood and affect. His behavior is normal.  Nursing note and vitals reviewed.   ED Course  Procedures (including critical care time)  DIAGNOSTIC STUDIES: Oxygen Saturation is 100% on RA, normal by my interpretation.    COORDINATION OF CARE: 9:20 PM - Discussed treatment plan with pt at bedside which includes imaging. Pt verbalized understanding and agreed to plan.   Labs Review Labs Reviewed  CBC WITH DIFFERENTIAL/PLATELET - Abnormal; Notable for the following:    RBC 6.25 (*)    Hemoglobin 17.1 (*)    All other components within normal limits  COMPREHENSIVE METABOLIC PANEL - Abnormal; Notable for the following:    Sodium 134 (*)    Chloride 100 (*)    Glucose, Bld 137 (*)    BUN 35 (*)    Creatinine, Ser 1.35 (*)    All other components within normal limits  CBG MONITORING, ED - Abnormal; Notable for the following:    Glucose-Capillary 141 (*)    All other components within normal limits    Imaging Review Ct Abdomen Pelvis W Contrast  09/15/2015  CLINICAL DATA:  Periumbilical pain with drainage for 2 days. Prior liver transplant. EXAM: CT ABDOMEN AND PELVIS WITH CONTRAST  TECHNIQUE: Multidetector CT imaging of the abdomen and pelvis was performed using the standard protocol following bolus administration of intravenous contrast. CONTRAST:  159mL ISOVUE-300 IOPAMIDOL (ISOVUE-300) INJECTION 61% COMPARISON:  07/09/2015 FINDINGS: The visualized lung bases are free of consolidation and effusion. Small calcified granulomas are again noted bilaterally. Sequelae of prior liver transplantation are again identified. No focal liver lesion is seen. The spleen, adrenal glands, left kidney, and pancreas have an unremarkable enhanced appearance. Subcentimeter hypodensities in the right kidney are unchanged and too small to fully characterize but most likely reflect cysts. Oral contrast is present in multiple nondilated loops of small bowel. There is no evidence of bowel obstruction or inflammation. The appendix is unremarkable. Upper abdominal varices are again seen and likely related to prior cirrhosis and portal hypertension. A retroaortic left renal vein is incidentally noted. The aorta is normal in caliber without significant atherosclerosis. No free fluid or enlarged lymph nodes are identified.  Soft tissue thickening and slight stranding in the anterior abdominal wall at the umbilicus is unchanged. No abdominal wall fluid collection or abscess is seen. Lumbar facet arthrosis is greatest on the right at L4-5. IMPRESSION: Sequelae of prior liver transplantation without acute abnormality identified. No evidence of abscess. Electronically Signed   By: Logan Bores M.D.   On: 09/15/2015 23:31   I have personally reviewed and evaluated these images and lab results as part of my medical decision-making.   EKG Interpretation None      MDM   Final diagnoses:  Cellulitis of umbilicus    I personally performed the services described in this documentation, which was scribed in my presence. The recorded information has been reviewed and is accurate.   Concern for possible abdominal wall  abscess versus fistula. Patient is very well-appearing otherwise. We'll get basic labs and CT abdomen.   No abscess demonstrated on CT. Patient does have some stranding around the umbilicus. We'll treat for cellulitis. Patient is advised to follow with his primary physician and return precautions have been given.  [name], MD 09/15/15 [phone]

## 2015-09-15 NOTE — ED Notes (Signed)
Patient has a history of diabetes and he has notices that for the last 4 -5 days he has a rash with drainage to his belly button.

## 2015-09-15 NOTE — Discharge Instructions (Signed)

## 2017-01-16 NOTE — Unmapped (Addendum)
Received notification via primary coordinators inbasket that patient currently does not have active insurance and thus urgent referrals have been placed for manufacturer assistance for prograf and myfortic.  Attempted to reach patient by phone to discuss pharmacy assistance program but no answer - left patient a vm requesting return call to discuss post transplant medications.    Placed second call to patient - reached him by phone - he notes he anticipates re-establishment of insurance on 8/1 but only has 7 days of txp meds left.  Encouraged him to consider activation of temporary pharmacy benefit via [facility] program as he is currently without insurance - provided him with contact info to initiate this and then # to [facility] to discuss need for urgent fills of meds.  Discussed [facility] has initiated urgent manufacturer assistance referrals and that he should complete all necessary paperwork accordingly.

## 2017-01-17 MED ORDER — TACROLIMUS 0.5 MG CAPSULE: capsule | 11 refills | 0 days | Status: AC

## 2017-01-17 MED ORDER — MYCOPHENOLATE SODIUM 180 MG TABLET,DELAYED RELEASE
ORAL_TABLET | Freq: Two times a day (BID) | ORAL | 11 refills | 0.00000 days | Status: CP
Start: 2017-01-17 — End: 2017-06-29

## 2017-01-17 MED ORDER — TACROLIMUS 0.5 MG CAPSULE
ORAL_CAPSULE | ORAL | 11 refills | 0.00000 days | Status: CP
Start: 2017-01-17 — End: 2017-01-17

## 2017-01-17 MED ORDER — MYCOPHENOLATE SODIUM 180 MG TABLET,DELAYED RELEASE: 180 mg | tablet | Freq: Two times a day (BID) | 11 refills | 0 days | Status: AC

## 2017-01-17 MED FILL — TACROLIMUS/0.5MG/CAPS: TACROLIMUS/0.5MG/CAPS | 14 days supply | Qty: 42 | Fill #0

## 2017-01-17 MED FILL — MYCOPHENOLIC ACID DR/180MG/TABS: MYCOPHENOLIC ACID DR/180MG/TABS | 14 days supply | Qty: 28 | Fill #0

## 2017-01-17 NOTE — Unmapped (Addendum)
Received notification from [facility] patient has been approved for pharmacy assistance - per Rosalene Billings, NP ok for patient to transition to generic myfortic and tacrolimus.  Spoke with patient by phone to discuss transition to generic and need to repeat labs after transition.  He notes he has <7 days of brand meds remaining and is aware of importance of repeating routine weekly labs ~1 week after transition to ensure stable labs and adequate trough levels - he plans to complete these @ [facility] hospital lab location.

## 2017-02-19 NOTE — Unmapped (Signed)
[facility] Refill and Clinical Coordination Note  Medication(s): TACROLIMUS 0.5 AND MYCOPHENOLIC ACID 180    Richard [name], DOB: 29-Aug-1965  Phone: [phone] (home) , Alternate phone contact: N/A  Shipping address: [address]  [place]  [address]  Phone or address changes today?: No  All above HIPAA information verified.  Insurance changes? No    Completed refill and clinical call assessment today to schedule patient's medication shipment from the [facility] [phone]).      MEDICATION RECONCILIATION    Confirmed the medication and dosage are correct and have not changed: Yes, regimen is correct and unchanged.    Were there any changes to your medication(s) in the past month:  No, there are no changes reported at this time.    ADHERENCE    Is this medicine transplant or covered by Medicare Part B? No.      Did you miss any doses in the past 4 weeks? No missed doses reported.  Adherence counseling provided? Not needed     SIDE EFFECT MANAGEMENT    Are you tolerating your medication?:  Richard [name] reports tolerating the medication.  Side effect management discussed: None      Therapy is appropriate and should be continued.    Evidence of clinical benefit: See Epic note from 06/22/17      FINANCIAL/SHIPPING    Delivery Scheduled: Yes, Expected medication delivery date: 02/21/17   Additional medications refilled: No additional medications/refills needed at this time.    Richard [name] did not have any additional questions at this time.    Delivery address validated in [facility] scheduling system: Yes, address listed above is correct.      We will follow up with patient monthly for standard refill processing and delivery.      Thank you,  Mickle Mallory   [facility] Specialty Pharmacist

## 2017-02-20 MED FILL — MYCOPHENOLIC ACID DR/180MG/TABS: MYCOPHENOLIC ACID DR/180MG/TABS | 90 days supply | Qty: 180 | Fill #1

## 2017-02-20 MED FILL — TACROLIMUS/0.5MG/CAPS: TACROLIMUS/0.5MG/CAPS | 90 days supply | Qty: 270 | Fill #1

## 2017-03-26 NOTE — Unmapped (Signed)
Dispensing optician requesting a return call to new mobile number.Spoke to patient who mentioned he had an appt tomorrow and inquired how that would be covered financially. Let him know he could apply for [facility], which would apply retroactively to tomorrow's visit. Sent him application link through [employer].

## 2017-03-27 ENCOUNTER — Ambulatory Visit: Admission: RE | Admit: 2017-03-27 | Discharge: 2017-03-27 | Disposition: A | Discharge status: Home / Self Care

## 2017-03-27 ENCOUNTER — Ambulatory Visit
Admission: RE | Admit: 2017-03-27 | Discharge: 2017-03-27 | Disposition: A | Discharge status: Home / Self Care | Attending: Gastroenterology | Admitting: Gastroenterology

## 2017-03-27 DIAGNOSIS — Z944 Liver transplant status: Principal | ICD-10-CM

## 2017-03-27 DIAGNOSIS — D899 Disorder involving the immune mechanism, unspecified: Principal | ICD-10-CM

## 2017-03-27 DIAGNOSIS — E612 Magnesium deficiency: Secondary | ICD-10-CM

## 2017-03-27 DIAGNOSIS — Z5181 Encounter for therapeutic drug level monitoring: Secondary | ICD-10-CM

## 2017-03-27 DIAGNOSIS — Z23 Encounter for immunization: Secondary | ICD-10-CM

## 2017-03-27 LAB — COMPREHENSIVE METABOLIC PANEL
ALBUMIN: 4.1 g/dL (ref 3.5–5.0)
ALKALINE PHOSPHATASE: 75 U/L (ref 38–126)
ALT (SGPT): 53 U/L (ref 19–72)
ANION GAP: 12 mmol/L (ref 9–15)
AST (SGOT): 35 U/L (ref 19–55)
BILIRUBIN TOTAL: 1.3 mg/dL — ABNORMAL HIGH (ref 0.0–1.2)
BLOOD UREA NITROGEN: 21 mg/dL (ref 7–21)
BUN / CREAT RATIO: 20
CALCIUM: 9.4 mg/dL (ref 8.5–10.2)
CHLORIDE: 105 mmol/L (ref 98–107)
EGFR MDRD AF AMER: >=60 mL/min/{1.73_m2} (ref >=60)
GLUCOSE RANDOM: 99 mg/dL (ref 65–99)
POTASSIUM: 3.9 mmol/L (ref 3.5–5.0)
SODIUM: 140 mmol/L (ref 135–145)

## 2017-03-27 LAB — CBC W/ AUTO DIFF
EOSINOPHILS ABSOLUTE COUNT: 0.1 10*9/L (ref 0.0–0.4)
HEMATOCRIT: 44.5 % (ref 41.0–53.0)
HEMOGLOBIN: 15.2 g/dL (ref 13.5–17.5)
LARGE UNSTAINED CELLS: 2 % (ref 0–4)
LYMPHOCYTES ABSOLUTE COUNT: 0.9 10*9/L — ABNORMAL LOW (ref 1.5–5.0)
MEAN CORPUSCULAR HEMOGLOBIN CONC: 34.2 g/dL (ref 31.0–37.0)
MEAN CORPUSCULAR VOLUME: 81.9 fL (ref 80.0–100.0)
MEAN PLATELET VOLUME: 7.3 fL (ref 7.0–10.0)
NEUTROPHILS ABSOLUTE COUNT: 5.1 10*9/L (ref 2.0–7.5)
PLATELET COUNT: 218 10*9/L (ref 150–440)
RED BLOOD CELL COUNT: 5.44 10*12/L (ref 4.50–5.90)
WBC ADJUSTED: 6.6 10*9/L (ref 4.5–11.0)

## 2017-03-27 LAB — BLOOD UREA NITROGEN: Urea nitrogen:MCnc:Pt:Ser/Plas:Qn:: 21

## 2017-03-27 LAB — MAGNESIUM
MAGNESIUM: 1.3 mg/dL — ABNORMAL LOW (ref 1.6–2.2)
Magnesium:MCnc:Pt:Ser/Plas:Qn:: 1.3 — ABNORMAL LOW

## 2017-03-27 LAB — BILIRUBIN DIRECT: Bilirubin.glucuronidated:MCnc:Pt:Ser/Plas:Qn:: 0.2

## 2017-03-27 LAB — PHOSPHORUS: Phosphate:MCnc:Pt:Ser/Plas:Qn:: 3.1

## 2017-03-27 LAB — LYMPHOCYTES ABSOLUTE COUNT: Lab: 0.9 — ABNORMAL LOW

## 2017-03-27 LAB — GAMMA GLUTAMYL TRANSFERASE: Gamma glutamyl transferase:CCnc:Pt:Ser/Plas:Qn:: 43

## 2017-03-27 LAB — TACROLIMUS, TROUGH: Lab: 2.2

## 2017-03-27 NOTE — Unmapped (Signed)
[facility], [facility], [address]  [place], [place]  [address]  Ph: [phone]  Fax: [phone]    03/27/2017    Patient Care Team:  Abelardo Diesel, MD as PCP - General (Family Medicine)  Genia Harold, RN as Registered Nurse (Transplant)    RE: Richard [name]; DOB: May 18, 1966        Reason for visit: Status post OLT      HPI: Richard [name] is a pleasant 51 year old Caucasian gentleman who is status post OLT on 03/02/2012 for NAFLD cirrhosis. He has had no episodes of rejection biliary strictures. He is currently doing well on tacrolimus 1 mg in the a.m. and 0.5 mg in the p.m. as well as Myfortic 180 mg twice a day. He underwent a colonoscopy and upper endoscopy on 07/13/2015. He has not seen dermatology this year. Patient does state over the past year he has been diagnosed with diabetes. He has been working on his diet and weight and is currently has a BMI 38.4. Today in clinic he overall feels well and denies any fever, chills, headache, jaundice, chest pain, upper lower GI bleeding, melena or confusion.            PMH:  Patient Active Problem List   Diagnosis   ??? Hypertension   ??? Immunosuppression (CMS-HCC)   ??? Acanthosis nigricans   ??? Acute renal failure (CMS-HCC)   ??? Renal insufficiency   ??? Hepatic cirrhosis (CMS-HCC)   ??? Coagulation defect (CMS-HCC)   ??? Type 2 diabetes mellitus with hyperglycemia, without long-term current use of insulin (CMS-HCC)   ??? Other sequelae of chronic liver disease   ??? Family history of malignant neoplasm of prostate   ??? Systolic murmur   ??? H/O gastroesophageal reflux (GERD)   ??? Hypogonadism in male   ??? Liver replaced by transplant (CMS-HCC)   ??? Morbid obesity with BMI of 45.0-49.9, adult (CMS-HCC)   ??? Obstructive sleep apnea syndrome   ??? Decreased platelet count (CMS-HCC)   ??? Proteinuria due to type 2 diabetes mellitus (CMS-HCC)   ??? Gastroenteritis   ??? Splenic artery aneurysm (CMS-HCC)   ??? Hiccups   ??? Hypophosphatemia   ??? Norovirus     Past Medical History:   Diagnosis Date   ??? Diabetes mellitus (CMS-HCC)    ??? Hypertension    ??? Liver cirrhosis secondary to NASH (nonalcoholic steatohepatitis) (CMS-HCC)     s/p liver transplant 2013       PSH:  Past Surgical History:   Procedure Laterality Date   ??? liver transpant     ??? LIVER TRANSPLANTATION  2013   ??? PR UPPER GI ENDOSCOPY,BIOPSY N/A 07/13/2015    Procedure: UGI ENDOSCOPY; WITH BIOPSY, SINGLE OR MULTIPLE;  Surgeon: Joaquin Music, MD;  [facility];  Service: Gastroenterology       MEDICATIONS:  Current Outpatient Prescriptions   Medication Sig Dispense Refill   ??? aspirin (ECOTRIN) 81 MG tablet Take 81 mg by mouth daily.     ??? CHOLECALCIFEROL, VITAMIN D3, (VITAMIN D3 ORAL) Take 2,000 Units by mouth daily.     ??? lisinopril (PRINIVIL,ZESTRIL) 10 MG tablet Take 10 mg by mouth daily.     ??? metFORMIN (GLUCOPHAGE) 1000 MG tablet Take 500 mg by mouth 2 (two) times a day with meals.     ??? multivitamin,tx-iron-minerals (COMPLETE MULTIVITAMIN) Tab Take by mouth daily.     ??? mycophenolate (MYFORTIC) 180 MG EC tablet  Take 1 tablet (180 mg total) by mouth Two (2) times a day. 60 tablet 11   ??? tacrolimus (PROGRAF) 0.5 MG capsule Take two caps (1mg  total) in AM and one cap (0.5mg  total) at PM 90 capsule 11   ??? testosterone (ANDROGEL) 20.25 mg/1.25 gram (1.62 %) gel pump daily.     ??? glimepiride (AMARYL) 1 MG tablet 2 mg daily.        No current facility-administered medications for this visit.        ALLERGIES:  Insulin asp prt-insulin aspart    FH:      SH:      RoS: Negative on 10 systems review other than what is mentioned above.    PE: Blood pressure 165/100, pulse 71, temperature 36.8 ??C (98.2 ??F), temperature source Tympanic, height 177.8 cm (5' 10), weight (!) 121.4 kg (267 lb 11.2 oz), SpO2 97 %. Body mass index is 38.41 kg/m??.;  WDWN, no acute distress; No scleral icterus; no xanthelasma; oropharynx clear; neck supple without lymphadenopathy, carotid bruits or jugular venous distention; Lungs were clear to the bases by auscultation and percussion; Heart: regular rate and rhythm, normal S1, S2 without significant murmur or gallop.  Abdomen: soft, non-tender, non-distended; no ascites; no hepatosplenomegaly; no rubs, bruits or masses.  Extremities: no clubbing, edema or palmer erythema.  Skin: no spider angiomata, no rash; Neuropsych: normal affect, speech pattern and cognitive function; oriented x 3; no asterixis.    LABS:  All lab results last 72 hours:    Recent Results (from the past 72 hour(s))   Tacrolimus Level, Trough    Collection Time: 03/27/17  8:32 AM   Result Value Ref Range    Tacrolimus, Trough 2.2 <=20.0 ng/mL   Comprehensive Metabolic Panel    Collection Time: 03/27/17  8:33 AM   Result Value Ref Range    Sodium 140 135 - 145 mmol/L    Potassium 3.9 3.5 - 5.0 mmol/L    Chloride 105 98 - 107 mmol/L    CO2 23.0 22.0 - 30.0 mmol/L    BUN 21 7 - 21 mg/dL    Creatinine 1.30 8.65 - 1.30 mg/dL    BUN/Creatinine Ratio 20     EGFR MDRD Non Af Amer >=60 >=60 mL/min/1.25m2    EGFR MDRD Af Amer >=60 >=60 mL/min/1.28m2    Anion Gap 12 9 - 15 mmol/L    Glucose 99 65 - 99 mg/dL    Calcium 9.4 8.5 - 78.4 mg/dL    Albumin 4.1 3.5 - 5.0 g/dL    Total Protein 7.3 6.5 - 8.3 g/dL    Total Bilirubin 1.3 (H) 0.0 - 1.2 mg/dL    AST 35 19 - 55 U/L    ALT 53 19 - 72 U/L    Alkaline Phosphatase 75 38 - 126 U/L   Bilirubin, Direct    Collection Time: 03/27/17  8:33 AM   Result Value Ref Range    Bilirubin, Direct 0.20 0.00 - 0.40 mg/dL   Phosphorus Level    Collection Time: 03/27/17  8:33 AM   Result Value Ref Range    Phosphorus 3.1 2.9 - 4.7 mg/dL   Magnesium Level    Collection Time: 03/27/17  8:33 AM   Result Value Ref Range    Magnesium 1.3 (L) 1.6 - 2.2 mg/dL   Gamma GT    Collection Time: 03/27/17  8:33 AM   Result Value Ref Range    GGT 43 12 - 109 U/L  CBC w/ Differential    Collection Time: 03/27/17  8:33 AM   Result Value Ref Range    WBC 6.6 4.5 - 11.0 10*9/L    RBC 5.44 4.50 - 5.90 10*12/L    HGB 15.2 13.5 - 17.5 g/dL    HCT 16.1 09.6 - 04.5 %    MCV 81.9 80.0 - 100.0 fL    MCH 28.0 26.0 - 34.0 pg    MCHC 34.2 31.0 - 37.0 g/dL    RDW 40.9 81.1 - 91.4 %    MPV 7.3 7.0 - 10.0 fL    Platelet 218 150 - 440 10*9/L    Absolute Neutrophils 5.1 2.0 - 7.5 10*9/L    Absolute Lymphocytes 0.9 (L) 1.5 - 5.0 10*9/L    Absolute Monocytes 0.3 0.2 - 0.8 10*9/L    Absolute Eosinophils 0.1 0.0 - 0.4 10*9/L    Absolute Basophils 0.0 0.0 - 0.1 10*9/L    Large Unstained Cells 2 0 - 4 %       ASSESSMENT: Richard [name] is a pleasant 51 year old Caucasian gentleman who is status post OLT on 03/02/2012 for NAFLD cirrhosis. He has had no episodes of rejection biliary strictures. He is currently doing well on tacrolimus 1 mg in the a.m. and 0.5 mg in the p.m. as well as Myfortic 180 mg twice a day. He underwent a colonoscopy and upper endoscopy on 07/13/2015. He has not seen dermatology this year. Patient does state over the past year he has been diagnosed with diabetes. He has been working on his diet and weight and is currently has a BMI 38.4.        PLAN:  1. I have seen and examined this patient today in clinic and discussed his care with Dr. Jason Coop.  2. Check laboratory studies.  3. We will check the results of today's liver ultrasound and chest x-ray.  4. Continue all medications as directed.  5. Continue diet and exercise program.  6. Plan to see Richard [name] back in liver clinic in 1 year.  7. Recommend annual dermatology surveillance.    ADDENDUM: Chest x-ray 03/27/2017: No acute airspace disease.    Liver ultrasound 03/27/2017: --Patent hepatic transplant vasculature.  --Stable hepatic arterial resistive indices, within normal limits.  --No focal hepatic masses appreciated sonographically.      Tina Griffiths, PA-C  [facility]  [address]  [place], [place]  [address]  Ph: [phone]  Fax: [phone]

## 2017-03-27 NOTE — Unmapped (Signed)
See immunization history

## 2017-03-28 NOTE — Unmapped (Signed)
Addended by: Genia Harold on: 03/27/2017 05:45 PM     Modules accepted: Orders, SmartSet

## 2017-03-28 NOTE — Unmapped (Addendum)
Patient seen in clinic today for his annual liver txp f/u. Patient is still on disability.He reports he is doing well, denies any sx of recent illness including n/v/d/constipation/fever/chills/nigh sweats or swelling. He denies use of tobacco products, etoh or illicit drugs. He says he is drinkiing aobut 8 c of water daily.  Spent about 10 minutes educating patient on the importance of cancer surveillance and infection prophylaxis. Also discussed importance of wt loss to decrease risk of [name] dz recurrence. Encouraged patient tom continue with regular pcp visits and annual dermatology exams, as well as colonoscopies when recommended by his GI physician. Next colonoscopy recommended for 2020.  Patient amenable to receiving ppv 23 today along with flu and 1st shingrix, approved by [facility].Patient says his tac trough today is reliable. Let patient know he would be contacted with any concerns r/t his imaging. Patient verbalized understanding of all and agreed to complete labs monthly now, admitting he has been lazy about it. He also reports he has completed the charity care application and desires to accomplish labs at [facility]. Patient's imaging reviewed by [facility] without recommendation for any interventions.

## 2017-03-29 NOTE — Unmapped (Signed)
TRF

## 2017-03-29 NOTE — Unmapped (Signed)
Reviewed patient's subpar tac level with PA, Tina Griffiths, who recommended no immediate intervention, but that patient repeat his labs next month.

## 2017-05-03 NOTE — Unmapped (Signed)
Received notice from a lab patient has chosen to use at times, requesting updated orders. Sent new annual lab orders to [facility].

## 2017-05-09 NOTE — Unmapped (Signed)
[facility] Pharmacy Refill Coordination Note  Specialty Medication(s): Mycophenolate 180mg  & Tacrolimus 0.5mg     Richard [name], DOB: 25-Sep-1965  Phone: [phone] (home) , Alternate phone contact: N/A  Phone or address changes today?: No  All above HIPAA information was verified with patient.  Shipping Address: [address]  [name] [address]   Insurance changes? No    Completed refill call assessment today to schedule patient's medication shipment from the [facility] [phone]).      Confirmed the medication and dosage are correct and have not changed: Yes, regimen is correct and unchanged.    Confirmed patient started or stopped the following medications in the past month:  No, there are no changes reported at this time.    Are you tolerating your medication?:  Richard [name] reports tolerating the medication.    ADHERENCE    (Below is required for Medicare Part B or Transplant patients only - per drug):   How many tablets were dispensed last month:   Myfortic 180 mg   Quantity filled last month: 180   # of tablets left on hand: 8 days  Tacrolimus 0.5 mg   Quantity filled last month: 270   # of tablets left on hand: 8 days    Did you miss any doses in the past 4 weeks? No missed doses reported.    FINANCIAL/SHIPPING    Delivery Scheduled: Yes, Expected medication delivery date: 05/15/2017     Richard [name] did not have any additional questions at this time.    Delivery address validated in FSI scheduling system: Yes, address listed in FSI is correct.    We will follow up with patient monthly for standard refill processing and delivery.      Thank you,  Tamala Fothergill   [facility] Shared [facility] Specialty Technician

## 2017-05-13 MED FILL — TACROLIMUS/0.5MG/CAPS: TACROLIMUS/0.5MG/CAPS | 90 days supply | Qty: 270 | Fill #2

## 2017-05-13 MED FILL — MYCOPHENOLIC ACID DR/180MG/TABS: MYCOPHENOLIC ACID DR/180MG/TABS | 90 days supply | Qty: 180 | Fill #2

## 2017-05-31 NOTE — Unmapped (Addendum)
Contacted patient to remind him to complete the 2nd part of his Shingrix immunizations. He said he had [facility] care now and would need to complete it at a [facility] facility and requested this coordinator find one nearer to him with available inventory of the vaccine. Let him know this coordinator would get back with him on it. [facility] [place] facility found with Shingrix inventory at [facility]. Attempted to contact patient today (12/13), but no VM box setup.

## 2017-06-29 MED ORDER — MYFORTIC 180 MG TABLET,DELAYED RELEASE
ORAL_TABLET | Freq: Two times a day (BID) | ORAL | 11 refills | 0.00000 days | Status: CP
Start: 2017-06-29 — End: 2018-01-04

## 2017-06-29 MED ORDER — MYFORTIC 180 MG TABLET,DELAYED RELEASE: 180 mg | tablet | Freq: Two times a day (BID) | 11 refills | 0 days | Status: AC

## 2017-06-29 MED ORDER — PROGRAF 0.5 MG CAPSULE
ORAL_CAPSULE | 11 refills | 0 days | Status: CP
Start: 2017-06-29 — End: 2017-07-02

## 2017-06-29 MED ORDER — MYFORTIC 180 MG TABLET,DELAYED RELEASE: 180 mg | tablet | 11 refills | 0 days

## 2017-06-29 NOTE — Unmapped (Signed)
Patient called to report he now has insurance again and is requesting his IS prescriptions be switched back to brand. Messaged him through my chart and gave him numbers to obtain mfr copay cards for brand and sent rxs to [facility]. Encouraged him to call them to discuss insurance details and inquire about copay cards as well. Let him know the txp clinic still has inventory of Shingrix vaccines for the 2nd one he needs. Also entered referral for family medicine doctor with the [facility] location, since he has relocated to [place].

## 2017-07-02 MED ORDER — TACROLIMUS 0.5 MG CAPSULE
ORAL_CAPSULE | 11 refills | 0 days | Status: CP
Start: 2017-07-02 — End: 2018-02-14

## 2017-07-02 NOTE — Unmapped (Addendum)
PA approved for Myfortic 180mg  from 06/29/17 to 06/25/38 for $0 with copay assistance.  Patient has been approved for copay assistance for Prograf 0.5mg  from 07/02/17 to 07/02/18 for $0

## 2017-07-03 MED FILL — PROGRAF/0.5MG/CAP: PROGRAF/0.5MG/CAP | 30 days supply | Qty: 90 | Fill #0

## 2017-07-03 MED FILL — MYFORTIC/180MG/TAB: MYFORTIC/180MG/TAB | 30 days supply | Qty: 60 | Fill #0

## 2017-07-13 NOTE — Unmapped (Signed)
error 

## 2017-07-19 NOTE — Unmapped (Signed)
[name] Refill and Clinical Coordination Note  Medication(s): PROGRAF, MYFORTIC    Richard [name], DOB: 17-Jul-1965  Phone: [phone] (home) , Alternate phone contact: N/A  Shipping address: [address] [place], [place] [address]  Phone or address changes today?: No  All above HIPAA information verified.  Insurance changes? No    Completed refill and clinical call assessment today to schedule patient's medication shipment from the [facility] [phone]).      MEDICATION RECONCILIATION    Confirmed the medication and dosage are correct and have not changed: Yes, regimen is correct and unchanged.    Were there any changes to your medication(s) in the past month:  No, there are no changes reported at this time.    ADHERENCE    Is this medicine transplant or covered by Medicare Part B? No.    Prograf 0.5 mg   Quantity filled last month: 90   # of tablets left on hand: 10DAYS LEFT      Myfortic 180 mg   Quantity filled last month: 60   # of tablets left on hand: 10 DAYS LEFT      Did you miss any doses in the past 4 weeks? No missed doses reported.  Adherence counseling provided? Not needed     SIDE EFFECT MANAGEMENT    Are you tolerating your medication?:  Richard [name] reports tolerating the medication.  Side effect management discussed: None      Therapy is appropriate and should be continued.    Evidence of clinical benefit: See Epic note from 03/27/17      FINANCIAL/SHIPPING    Delivery Scheduled: Yes, Expected medication delivery date: 07/25/17   Additional medications refilled: No additional medications/refills needed at this time.    Richard [name] did not have any additional questions at this time.    Delivery address validated in FSI scheduling system: Yes, address listed above is correct.      We will follow up with patient monthly for standard refill processing and delivery.      Thank you,  [name]   [facility] Shared [facility] Specialty Pharmacist

## 2017-07-26 MED FILL — MYFORTIC/180MG/TAB: MYFORTIC/180MG/TAB | 30 days supply | Qty: 60 | Fill #1

## 2017-07-26 MED FILL — PROGRAF/0.5MG/CAP: PROGRAF/0.5MG/CAP | 30 days supply | Qty: 90 | Fill #1

## 2017-08-15 NOTE — Unmapped (Signed)
[facility] Specialty Pharmacy Refill Coordination Note    Specialty Program and Medication(s) to be Shipped:   SOT PROGRAM  PROGRAF 0.5MG   MYFORTIC 180MG   Other medications to be shipped: NO     Richard [name], DOB: 16-Mar-1966  Phone: [phone] (home)   Shipping Address: [address]  [place] [address]  Address confirmed in FSI: yes  All above HIPAA information was verified with patient.     Completed refill call assessment today to schedule patient's medication shipment from the [facility] [phone]).       Specialty medication(s) and dose(s) confirmed: Yes: DOSES CORRECT   Changes to medications: No  Changes to insurance: No  Questions for the pharmacist: No    The patient will receive an FSI print out for each medication shipped and additional FDA Medication Guides as required.  Patient education from [place] or [name] may also be included in the shipment.    DISEASE-SPECIFIC INFORMATION        N/A    ADHERENCE          (Below is required for Medicare Part B billed medications only - per drug):   1. PROGRAF 0.5: Quantity dispensed last month: 90  Remaining supply on hand: 10 day(s).   2.MYFORTIC 180MG : Quantity dispensed last month: 60  Remaining supply on hand: 10 day(s).      SHIPPING     Delivery Scheduled: yes, Expected medication delivery date: 08/22/17      Marletta Lor  [facility]

## 2017-08-21 MED FILL — MYFORTIC/180MG/TAB: MYFORTIC/180MG/TAB | 30 days supply | Qty: 60 | Fill #2

## 2017-08-21 MED FILL — PROGRAF/0.5MG/CAP: PROGRAF/0.5MG/CAP | 30 days supply | Qty: 90 | Fill #2

## 2017-09-12 NOTE — Unmapped (Signed)
[facility] Specialty Pharmacy Refill Coordination Note  Specialty Medication(s): Myfortic 180mg  & Prograf 0.5mg     IANMICHAEL AMESCUA, DOB: 06/21/1966  Phone: [phone] (home) , Alternate phone contact: N/A  Phone or address changes today?: No  All above HIPAA information was verified with patient.  Shipping Address: [address]  [name] [address]   Insurance changes? No    Completed refill call assessment today to schedule patient's medication shipment from the [facility] [phone]).      Confirmed the medication and dosage are correct and have not changed: Yes, regimen is correct and unchanged.    Confirmed patient started or stopped the following medications in the past month:  No, there are no changes reported at this time.    Are you tolerating your medication?:  Terrin reports tolerating the medication.    ADHERENCE    (Below is required for Medicare Part B or Transplant patients only - per drug):   How many tablets were dispensed last month:     Prograf 0.5 mg   Quantity filled last month: 90   # of tablets left on hand: 10 days    Myfortic 180 mg   Quantity filled last month: 60   # of tablets left on hand: 10 days    Did you miss any doses in the past 4 weeks? No missed doses reported.    FINANCIAL/SHIPPING    Delivery Scheduled: Yes, Expected medication delivery date: 09/21/2017     The patient will receive an FSI print out for each medication shipped and additional FDA Medication Guides as required.  Patient education from [place] or [name] may also be included in the shipment    Rich did not have any additional questions at this time.    Delivery address validated in FSI scheduling system: Yes, address listed in FSI is correct.    We will follow up with patient monthly for standard refill processing and delivery.      Thank you,  Tamala Fothergill   [facility] Shared [facility] Specialty Technician

## 2017-09-20 MED FILL — PROGRAF/0.5MG/CAP: PROGRAF/0.5MG/CAP | 30 days supply | Qty: 90 | Fill #3

## 2017-09-20 MED FILL — MYFORTIC/180MG/TAB: MYFORTIC/180MG/TAB | 30 days supply | Qty: 60 | Fill #3

## 2017-10-11 NOTE — Unmapped (Signed)
[facility] Specialty Pharmacy Refill and Clinical Coordination Note  Medication(s): PROGRAF 0.5MG , MYFORTIC 180MG     Richard [name], DOB: 05/21/1966  Phone: [phone] (home) , Alternate phone contact: N/A  Shipping address: [address] [place], [place] [address]  Phone or address changes today?: No  All above HIPAA information verified.  Insurance changes? No    Completed refill and clinical call assessment today to schedule patient's medication shipment from the [facility] [phone]).      MEDICATION RECONCILIATION    Confirmed the medication and dosage are correct and have not changed: Yes, regimen is correct and unchanged.Prograf is 0.5mg  2am/1pm, myfortic is 180mg  1bid- verified per pt, epic, and FSI.    Were there any changes to your medication(s) in the past month:  No, there are no changes reported at this time.    ADHERENCE    Is this medicine transplant or covered by Medicare Part B? Yes.    Prograf 0.5 mg   Quantity filled last month: 90   # of tablets left on hand: 7 days left      Myfortic 180 mg   Quantity filled last month: 60   # of tablets left on hand: 7 days left      Did you miss any doses in the past 4 weeks? No missed doses reported.  Adherence counseling provided? Not needed     SIDE EFFECT MANAGEMENT    Are you tolerating your medication?:  Richard [name] reports tolerating the medication.  Side effect management discussed: None      Therapy is appropriate and should be continued.    Evidence of clinical benefit: See Epic note from 03/27/17      FINANCIAL/SHIPPING    Delivery Scheduled: Yes, Expected medication delivery date: 10/15/17   Additional medications refilled: No additional medications/refills needed at this time.    The patient will receive an FSI print out for each medication shipped and additional FDA Medication Guides as required.  Patient education from [name] or Richard [name] may also be included in the shipment.    Richard [name] did not have any additional questions at this time.    Delivery address validated in FSI scheduling system: Yes, address listed above is correct.      We will follow up with patient monthly for standard refill processing and delivery.      Thank you,  [name]   [facility] Shared [facility] Specialty Pharmacist

## 2017-10-11 NOTE — Unmapped (Signed)
Myfortic and Prograf too soon until 4/20

## 2017-10-14 MED FILL — MYFORTIC/180MG/TAB: MYFORTIC/180MG/TAB | 30 days supply | Qty: 60 | Fill #4

## 2017-10-14 MED FILL — PROGRAF/0.5MG/CAP: PROGRAF/0.5MG/CAP | 30 days supply | Qty: 90 | Fill #4

## 2017-11-06 MED FILL — PROGRAF/0.5MG/CAP: PROGRAF/0.5MG/CAP | 30 days supply | Qty: 90 | Fill #5

## 2017-11-06 MED FILL — MYFORTIC/180MG/TAB: MYFORTIC/180MG/TAB | 30 days supply | Qty: 60 | Fill #5

## 2017-11-06 NOTE — Unmapped (Signed)
[facility] Specialty Pharmacy Refill Coordination Note  Specialty Medication(s): Prograf 0.5 mg (2 cap AM, 1 cap PM), Myfortic 180 mg (1 tab BID)  Additional Medications shipped: None    Richard [name], DOB: [birth year]  Phone: [phone] (home) , Alternate phone contact: N/A  Phone or address changes today?: No  All above HIPAA information was verified with patient.  Shipping Address: [address]  [name] [address]   Insurance changes? No    Completed refill call assessment today to schedule patient's medication shipment from the [facility] [phone]).      Confirmed the medication and dosage are correct and have not changed: Yes, regimen is correct and unchanged.    Confirmed patient started or stopped the following medications in the past month:  No, there are no changes reported at this time.    Are you tolerating your medication?:  Shean reports tolerating the medication.    ADHERENCE    (Below is required for Medicare Part B or Transplant patients only - per drug):   Prograf 0.5 mg   Quantity filled last month: 90   # of tablets left on hand: 7 DAYS LEFT    Myfortic 180 mg   Quantity filled last month: 60   # of tablets left on hand: 7 DAYS LEFT        Did you miss any doses in the past 4 weeks? No missed doses reported.    FINANCIAL/SHIPPING    Delivery Scheduled: Yes, Expected medication delivery date: 11/07/17 via next-day COURIER    The patient will receive an FSI print out for each medication shipped and additional FDA Medication Guides as required.  Patient education from [place] or Richard [name] may also be included in the shipment    Chad did not have any additional questions at this time.    Delivery address validated in FSI scheduling system: Yes, address listed in FSI is correct.    We will follow up with patient monthly for standard refill processing and delivery.      Thank you,  Burnett Corrente   [facility] Specialty PharmD Candidate

## 2017-11-28 NOTE — Unmapped (Signed)
[facility] Specialty Pharmacy Refill Coordination Note    Specialty Medication(s) to be Shipped:   Transplant: Myfortic 180mg  and Prograf 0.5mg     Other medication(s) to be shipped:       Richard [name], DOB: 03-08-1966  Phone: [phone] (home)   Shipping Address: [address]  [place] [address]    All above HIPAA information was verified with patient.     Completed refill call assessment today to schedule patient's medication shipment from the [facility] [phone]).       Specialty medication(s) and dose(s) confirmed: Regimen is correct and unchanged.   Changes to medications: Gay reports no changes reported at this time.  Changes to insurance: No  Questions for the pharmacist: No    The patient will receive an FSI print out for each medication shipped and additional FDA Medication Guides as required.  Patient education from [place] or [name] may also be included in the shipment.    DISEASE-SPECIFIC INFORMATION        N/A    ADHERENCE              MEDICARE PART B DOCUMENTATION         SHIPPING     Shipping address confirmed in FSI.     Delivery Scheduled: Yes, Expected medication delivery date: 061219 [facility] NEXT DAY via UPS or courier.     Antonietta Barcelona   [facility] Shared [facility] Specialty Technician

## 2017-12-04 MED FILL — MYFORTIC/180MG/TAB: MYFORTIC/180MG/TAB | 30 days supply | Qty: 60 | Fill #6

## 2017-12-04 MED FILL — PROGRAF/0.5MG/CAP: PROGRAF/0.5MG/CAP | 30 days supply | Qty: 90 | Fill #6

## 2017-12-26 NOTE — Unmapped (Signed)
Patient wasn't at home at the time of the call but did say he was running low on both the myfortic and prograf-he did confirm he had at least a week on hand at this time  No dose changes reported    [facility] Specialty Pharmacy Refill Coordination Note    Specialty Medication(s) to be Shipped:   Transplant: Myfortic 180mg  and Prograf 0.5mg     Other medication(s) to be shipped: n/a     [name], DOB: Nov 02, 1965  Phone: [phone] (home)   Shipping Address: [address]  [place] [address]    All above HIPAA information was verified with patient.     Completed refill call assessment today to schedule patient's medication shipment from the [facility] [phone]).       Specialty medication(s) and dose(s) confirmed: Regimen is correct and unchanged.   Changes to medications: Dametrius reports no changes reported at this time.  Changes to insurance: No  Questions for the pharmacist: No    The patient will receive an FSI print out for each medication shipped and additional FDA Medication Guides as required.  Patient education from [place] or [name] may also be included in the shipment.    DISEASE-SPECIFIC INFORMATION        N/A    ADHERENCE     Medication Adherence    Patient reported X missed doses in the last month:  0  Specialty Medication:  PROGRAF 0.5MG   Patient is on additional specialty medications:  Yes  Additional Specialty Medications:  MYFORTIC 180MG   Patient Reported Additional Medication X Missed Doses in the Last Month:  0  Patient is on more than two specialty medications:  No  Any gaps in refill history greater than 2 weeks in the last 3 months:  no  Demonstrates understanding of importance of adherence:  yes  Informant:  patient  Confirmed plan for next specialty medication refill:  delivery by pharmacy  Refills needed for supportive medications:  not needed          Refill Coordination    Has the Patients' Contact Information Changed:  No  Is the Shipping Address Different:  No       Patient does not have Part B  SHIPPING     Shipping address confirmed in FSI.     Delivery Scheduled: Yes, Expected medication delivery date: 7/9 via UPS or courier.     Renette Butters   [facility] Specialty Technician

## 2017-12-31 MED FILL — MYFORTIC/180MG/TAB: MYFORTIC/180MG/TAB | 30 days supply | Qty: 60 | Fill #7

## 2017-12-31 MED FILL — PROGRAF/0.5MG/CAP: PROGRAF/0.5MG/CAP | 30 days supply | Qty: 90 | Fill #7

## 2018-01-04 MED ORDER — MYFORTIC 180 MG TABLET,DELAYED RELEASE
ORAL_TABLET | Freq: Two times a day (BID) | ORAL | 11 refills | 0.00000 days | Status: CP
Start: 2018-01-04 — End: 2018-01-04

## 2018-01-04 MED ORDER — MYCOPHENOLATE SODIUM 180 MG TABLET,DELAYED RELEASE
ORAL_TABLET | ORAL | 11 refills | 0 days
Start: 2018-01-04 — End: 2018-02-19

## 2018-01-17 NOTE — Unmapped (Signed)
[facility] Specialty Pharmacy Refill Coordination Note    Specialty Medication(s) to be Shipped:   PROGRAF 0.5,MYFORTIC 180    Other medication(s) to be shipped:       Richard [name], DOB: 11/09/1965  Phone: [phone] (home)   Shipping Address: [address]  [place] [address]    All above HIPAA information was verified with patient.     Completed refill call assessment today to schedule patient's medication shipment from the [facility] [phone]).       Specialty medication(s) and dose(s) confirmed: Regimen is correct and unchanged.   Changes to medications: [name] reports no changes reported at this time.  Changes to insurance: No  Questions for the pharmacist: No    The patient will receive an FSI print out for each medication shipped and additional FDA Medication Guides as required.  Patient education from [place] or [name] may also be included in the shipment.    DISEASE/MEDICATION-SPECIFIC INFORMATION        N/A    ADHERENCE              MEDICARE PART B DOCUMENTATION         SHIPPING     Shipping address confirmed in FSI.     Delivery Scheduled: Yes, Expected medication delivery date: 080219 via UPS or courier.     Antonietta Barcelona   [facility] Shared [facility] Specialty Technician

## 2018-01-24 MED FILL — PROGRAF/0.5MG/CAP: PROGRAF/0.5MG/CAP | 30 days supply | Qty: 90 | Fill #8

## 2018-01-24 MED FILL — MYFORTIC/180MG/TAB: MYFORTIC/180MG/TAB | 30 days supply | Qty: 60 | Fill #8

## 2018-01-26 NOTE — Unmapped (Signed)
Epic [facility]) medication reconciliation is completed.   Current Outpatient Medications on File Prior to Visit   Medication Sig   ??? aspirin (ECOTRIN) 81 MG tablet Take 81 mg by mouth daily.   ??? CHOLECALCIFEROL, VITAMIN D3, (VITAMIN D3 ORAL) Take 2,000 Units by mouth daily.   ??? glimepiride (AMARYL) 1 MG tablet 2 mg daily.    ??? lisinopril (PRINIVIL,ZESTRIL) 10 MG tablet Take 10 mg by mouth daily.   ??? metFORMIN (GLUCOPHAGE) 1000 MG tablet Take 500 mg by mouth 2 (two) times a day with meals.   ??? multivitamin,tx-iron-minerals (COMPLETE MULTIVITAMIN) Tab Take by mouth daily.   ??? MYFORTIC 180 mg EC tablet Take 1 tablet (180 mg total) by mouth Two (2) times a day.   ??? tacrolimus (PROGRAF) 0.5 MG capsule Take two caps (1mg  total) in AM and one cap (0.5mg  total) at PM   ??? testosterone (ANDROGEL) 20.25 mg/1.25 gram (1.62 %) gel pump daily.     No current facility-administered medications on file prior to visit.      Allergies   Allergen Reactions   ??? Insulin Asp Prt-Insulin Aspart      Other reaction(s): Other  Burning on injection and skin reaction.

## 2018-02-01 NOTE — Unmapped (Signed)
Epic [facility]) medication reconciliation is completed.

## 2018-02-14 MED ORDER — TACROLIMUS 0.5 MG CAPSULE
ORAL_CAPSULE | 11 refills | 0 days | Status: CP
Start: 2018-02-14 — End: 2018-03-15
  Filled 2018-02-19: qty 42, 14d supply, fill #0

## 2018-02-14 NOTE — Unmapped (Signed)
[facility] Specialty Pharmacy Refill and Clinical Coordination Note  Medication(s): prograf, myfortic    Richard [name], DOB: 1965-07-23  Phone: [phone] (home) , Alternate phone contact: N/A  Shipping address: [address] [place], [place] [address]  Phone or address changes today?: No  All above HIPAA information verified.  Insurance changes? No    Completed refill and clinical call assessment today to schedule patient's medication shipment from the [facility] [phone]).      MEDICATION RECONCILIATION    Confirmed the medication and dosage are correct and have not changed: Yes, regimen is correct and unchanged.    Were there any changes to your medication(s) in the past month:  No, there are no changes reported at this time.    ADHERENCE    Is this medicine transplant or covered by Medicare Part B? Yes.    Prograf 0.5 mg   Quantity filled last month: 90   # of tablets left on hand: 12 days left      Myfortic 180 mg   Quantity filled last month: 60   # of tablets left on hand: 12 days left      Did you miss any doses in the past 4 weeks? No missed doses reported.  Adherence counseling provided? Not needed     SIDE EFFECT MANAGEMENT    Are you tolerating your medication?:  Richard [name] reports tolerating the medication.  Side effect management discussed: None      Therapy is appropriate and should be continued.    Evidence of clinical benefit: See Epic note from 03/27/17      FINANCIAL/SHIPPING    Delivery Scheduled: Yes, Expected medication delivery date: 02/20/18 via ups     Additional medications refilled: No additional medications/refills needed at this time.    The patient will receive a drug information handout for each medication shipped and additional FDA Medication Guides as required.      Jeffrey did not have any additional questions at this time.    Delivery address confirmed in Epic.     We will follow up with patient monthly for standard refill processing and delivery.      Thank you, [name]   [facility] Shared [facility] Specialty Pharmacist

## 2018-02-19 MED ORDER — MYCOPHENOLATE SODIUM 180 MG TABLET,DELAYED RELEASE
ORAL_TABLET | ORAL | 11 refills | 0 days | Status: CP
Start: 2018-02-19 — End: 2019-02-20
  Filled 2018-02-22: qty 60, 30d supply, fill #0

## 2018-02-19 MED FILL — PROGRAF 0.5 MG CAPSULE: 14 days supply | Qty: 42 | Fill #0 | Status: AC

## 2018-02-19 NOTE — Unmapped (Signed)
Called pt to discuss scheduling annual appt. Pt said he likes early am and can start before 8 am, has no conflicts in late September, would like letter mailed and verbalized understanding of all discussed.

## 2018-02-22 MED FILL — MYCOPHENOLATE SODIUM 180 MG TABLET,DELAYED RELEASE: 30 days supply | Qty: 60 | Fill #0 | Status: AC

## 2018-03-08 NOTE — Unmapped (Addendum)
Patient messaged via Mychart request for call. He said he had lost his obama care insurance and could not reapply until Nov.and it would not become effective until January. I inquired about [facility] to help cover his medical appts/labs, explaining that he has had diarrhea for 2 wks. He denies fever or n/v. Agreed to send him charity care application link and put in orders for labs and stool testing, directing him to obtain the appropriate containers for the testing before bringing them to the lab. Also, advised him to refrigerate samples and take to the lab within 24 hrs. He verbalized understanding. Contacted PA Elliott to determine specific testing-he recommended pathogen panel, CDiff,and  CMV stool.

## 2018-03-15 MED ORDER — TACROLIMUS 0.5 MG CAPSULE
ORAL_CAPSULE | 11 refills | 0 days | Status: CP
Start: 2018-03-15 — End: ?
  Filled 2018-04-02: qty 90, 30d supply, fill #0

## 2018-03-15 NOTE — Unmapped (Signed)
[facility] Refill Coordination Note  Specialty Medication(s): PROGRAF 0.5  Additional Medications shipped:   PT NO LONGER GETTING MYFORTIC FROM US,HE GETS FROM [facility] DETAILS IN REFERRAL    Richard [name], DOB: 23-Aug-1965  Phone: [phone] (home) , Alternate phone contact: N/A  Phone or address changes today?: No  All above HIPAA information was verified with patient.  Shipping Address: [address]  [name] [address]   Insurance changes? No    Completed refill call assessment today to schedule patient's medication shipment from the [facility] [phone]).      Confirmed the medication and dosage are correct and have not changed: Patient declines to answer.    Confirmed patient started or stopped the following medications in the past month:  No, there are no changes reported at this time.    Are you tolerating your medication?:  Richard [name] reports tolerating the medication.    ADHERENCE    (Below is required for Medicare Part B or Transplant patients only - per drug):   How many tablets were dispensed last month: PROGRAF 0.5 -42 CAPS  Patient currently has PROGRAF 0.5MG -15 CAPS  remaining.    Did you miss any doses in the past 4 weeks? No missed doses reported.    FINANCIAL/SHIPPING    Delivery Scheduled: Yes, Expected medication delivery date: 092519     The patient will receive a drug information handout for each medication shipped and additional FDA Medication Guides as required.      Hazel did not have any additional questions at this time.    Delivery address validated in Epic.    We will follow up with patient monthly for standard refill processing and delivery.      Thank you,  Antonietta Barcelona   [facility] Specialty Technician

## 2018-03-22 NOTE — Unmapped (Signed)
Called pt as requested by covering coord to go over details re pt's annual appt on 10/1. Pt wanted clarification [facility] and then appts at hospital. Pt said he did not receive letter sent thru mail and mychart sent 9/16.  Answered all of pt's questions, and he verbalized understanding of all discussed.

## 2018-03-27 NOTE — Unmapped (Signed)
Received call from pt who wanted to reschedule annual appt which he missed yesterday. Pt said he preferred early am and all appts in the hospital. Rescheduled him for Wed 10/30, and he verbalized understanding of all discussed.

## 2018-03-27 NOTE — Unmapped (Addendum)
Contacted pharmacy when completing patient's TRF, to check on how he has been filling his tacrolimus. They said he has not filled since 8/27, and he is obtaining his myfortic from the mfr. Attempted to contact patient, but his VM box was not set up. Patient also missed his annual appt yesterday. He returned call to coordinator and said that he has been taking his tacrolimus and has not run out of them. He reported completing the paperwork for [facility] and pharmacy assistance. He agreed to refax forms to this coordinator to resent to pharmacy and f/u on its receipt. Received patient's application and sent it to [facility] in MAP dept who forwarded to PAP dept specialist.

## 2018-03-29 NOTE — Unmapped (Signed)
Received notice from Pap team that patient had not completed his paperwork and they were unable to reach him. Contacted patient and gave him the number to call them, explaining that he had not completed the paperwork. He agreed to call them.

## 2018-04-02 MED FILL — MYCOPHENOLATE SODIUM 180 MG TABLET,DELAYED RELEASE: 30 days supply | Qty: 60 | Fill #1 | Status: AC

## 2018-04-02 MED FILL — PROGRAF 0.5 MG CAPSULE: 30 days supply | Qty: 90 | Fill #0 | Status: AC

## 2018-04-02 MED FILL — MYCOPHENOLATE SODIUM 180 MG TABLET,DELAYED RELEASE: ORAL | 30 days supply | Qty: 60 | Fill #1

## 2018-04-02 NOTE — Unmapped (Signed)
[facility] Refill Coordination Note    Specialty Medication(s) to be Shipped:   Transplant:  mycophenolic acid 180mg  and Prograf 0.5mg      Due to nationwide backorder of generic tacrolimus 1mg , patient will be switched to brand prograf 1mg  since same copay for patient. Patient and clinic are aware of the switch. Patient also aware of need for followup lab work. I will message coordinator today of approximate date of switch so appropriate labwork can be ordered.      Richard [name], DOB: February 08, 1966  Phone: [phone] (home)   Shipping Address: [address]  [place] [address]    All above HIPAA information was verified with patient.     Completed refill call assessment today to schedule patient's medication shipment from the [facility] [phone]).       Specialty medication(s) and dose(s) confirmed: Regimen is correct and unchanged.   Changes to medications: Isauro reports no changes reported at this time.  Changes to insurance: No  Questions for the pharmacist: No    The patient will receive a drug information handout for each medication shipped and additional FDA Medication Guides as required.      DISEASE/MEDICATION-SPECIFIC INFORMATION        N/A    ADHERENCE     Medication Adherence    Patient reported X missed doses in the last month:  0  Specialty Medication:  Tacrolimus 0.5mg  & Mycophenolate 180mg   Patient is on additional specialty medications:  No  Patient is on more than two specialty medications:  No  Any gaps in refill history greater than 2 weeks in the last 3 months:  no  Demonstrates understanding of importance of adherence:  yes  Informant:  patient  Reliability of informant:  fairly reliable  Confirmed plan for next specialty medication refill:  delivery by pharmacy          Refill Coordination    Has the Patients' Contact Information Changed:  No  Is the Shipping Address Different:  No         MEDICARE PART B DOCUMENTATION     Mycophenolic acid 180mg : Patient has 0 tablets on hand.  Prograf 0.5mg : Patient has 0 capsules on hand.    SHIPPING     Shipping address confirmed in Epic.     Delivery Scheduled: Yes, Expected medication delivery date: 04/03/2018 via UPS or courier.     Letricia Krinsky Leodis Binet   [facility] Shared [facility] Specialty Technician

## 2018-04-22 NOTE — Unmapped (Signed)
Patient's standing lab orders updated for [facility].

## 2018-04-23 NOTE — Unmapped (Signed)
[facility] Refill Coordination Note    Specialty Medication(s) to be Shipped:   Transplant:  mycophenolic acid 180mg  and Prograf 0.5mg     Other medication(s) to be shipped: none     Richard [name], DOB: 1965-09-27  Phone: [phone] (home)       All above HIPAA information was verified with patient.     Completed refill call assessment today to schedule patient's medication shipment from the [facility] [phone]).       Specialty medication(s) and dose(s) confirmed: Regimen is correct and unchanged.   Changes to medications: Ferdie reports no changes reported at this time.  Changes to insurance: No  Questions for the pharmacist: No    The patient will receive a drug information handout for each medication shipped and additional FDA Medication Guides as required.      DISEASE/MEDICATION-SPECIFIC INFORMATION        N/A    ADHERENCE     Medication Adherence    Patient reported X missed doses in the last month:  0                          MEDICARE PART B DOCUMENTATION     Mycophenolic acid 180mg : Patient has 10 days worth of tablets on hand.  Prograf 0.5mg : Patient has 10 days worth of capsules on hand.    SHIPPING     Shipping address confirmed in Epic.     Delivery Scheduled: Yes, Expected medication delivery date: 04/26/18 via UPS or courier.     Medication will be delivered via UPS to the home address in Epic WAM.    Richard [name]   [facility] Shared [facility] Specialty Technician

## 2018-04-24 ENCOUNTER — Ambulatory Visit: Admit: 2018-04-24 | Discharge: 2018-04-25

## 2018-04-24 ENCOUNTER — Ambulatory Visit: Admit: 2018-04-24 | Discharge: 2018-04-25 | Attending: Gastroenterology | Primary: Gastroenterology

## 2018-04-24 DIAGNOSIS — D899 Disorder involving the immune mechanism, unspecified: Principal | ICD-10-CM

## 2018-04-24 DIAGNOSIS — Z944 Liver transplant status: Principal | ICD-10-CM

## 2018-04-24 DIAGNOSIS — Z5181 Encounter for therapeutic drug level monitoring: Secondary | ICD-10-CM

## 2018-04-24 DIAGNOSIS — Z23 Encounter for immunization: Secondary | ICD-10-CM

## 2018-04-24 DIAGNOSIS — E612 Magnesium deficiency: Secondary | ICD-10-CM

## 2018-04-24 LAB — TACROLIMUS, TROUGH: Lab: 3.1 — ABNORMAL LOW

## 2018-04-24 LAB — CBC W/ AUTO DIFF
BASOPHILS ABSOLUTE COUNT: 0 10*9/L (ref 0.0–0.1)
BASOPHILS RELATIVE PERCENT: 0.4 %
EOSINOPHILS ABSOLUTE COUNT: 0.2 10*9/L (ref 0.0–0.4)
EOSINOPHILS RELATIVE PERCENT: 3.2 %
HEMATOCRIT: 39.3 % — ABNORMAL LOW (ref 41.0–53.0)
HEMOGLOBIN: 13.1 g/dL — ABNORMAL LOW (ref 13.5–17.5)
LARGE UNSTAINED CELLS: 2 % (ref 0–4)
LYMPHOCYTES ABSOLUTE COUNT: 1.3 10*9/L — ABNORMAL LOW (ref 1.5–5.0)
LYMPHOCYTES RELATIVE PERCENT: 22 %
MEAN CORPUSCULAR HEMOGLOBIN CONC: 33.4 g/dL (ref 31.0–37.0)
MEAN CORPUSCULAR HEMOGLOBIN: 27.6 pg (ref 26.0–34.0)
MEAN PLATELET VOLUME: 6.8 fL — ABNORMAL LOW (ref 7.0–10.0)
MONOCYTES ABSOLUTE COUNT: 0.3 10*9/L (ref 0.2–0.8)
NEUTROPHILS ABSOLUTE COUNT: 4 10*9/L (ref 2.0–7.5)
NEUTROPHILS RELATIVE PERCENT: 66.7 %
PLATELET COUNT: 254 10*9/L (ref 150–440)
RED BLOOD CELL COUNT: 4.76 10*12/L (ref 4.50–5.90)
RED CELL DISTRIBUTION WIDTH: 13.9 % (ref 12.0–15.0)
WBC ADJUSTED: 6 10*9/L (ref 4.5–11.0)

## 2018-04-24 LAB — COMPREHENSIVE METABOLIC PANEL
ALBUMIN: 4.1 g/dL (ref 3.5–5.0)
ALT (SGPT): 53 U/L (ref 19–72)
ANION GAP: 14 mmol/L (ref 7–15)
AST (SGOT): 44 U/L (ref 19–55)
BILIRUBIN TOTAL: 0.9 mg/dL (ref 0.0–1.2)
BLOOD UREA NITROGEN: 17 mg/dL (ref 7–21)
BUN / CREAT RATIO: 16
CALCIUM: 9.5 mg/dL (ref 8.5–10.2)
CREATININE: 1.04 mg/dL (ref 0.70–1.30)
EGFR CKD-EPI AA MALE: >90 mL/min/{1.73_m2} (ref >=60)
EGFR CKD-EPI NON-AA MALE: 83 mL/min/{1.73_m2} (ref >=60)
GLUCOSE RANDOM: 156 mg/dL — ABNORMAL HIGH (ref 65–99)
POTASSIUM: 4.3 mmol/L (ref 3.5–5.0)
PROTEIN TOTAL: 7.2 g/dL (ref 6.5–8.3)
SODIUM: 142 mmol/L (ref 135–145)

## 2018-04-24 LAB — PHOSPHORUS: Phosphate:MCnc:Pt:Ser/Plas:Qn:: 3.3

## 2018-04-24 LAB — BILIRUBIN DIRECT: Bilirubin.glucuronidated:MCnc:Pt:Ser/Plas:Qn:: 0.3

## 2018-04-24 LAB — TACROLIMUS LEVEL, TROUGH: TACROLIMUS, TROUGH: 3.1 ng/mL — ABNORMAL LOW (ref 5.0–15.0)

## 2018-04-24 LAB — MAGNESIUM: Magnesium:MCnc:Pt:Ser/Plas:Qn:: 1.3 — ABNORMAL LOW

## 2018-04-24 LAB — GAMMA GLUTAMYL TRANSFERASE: Gamma glutamyl transferase:CCnc:Pt:Ser/Plas:Qn:: 41

## 2018-04-24 LAB — EGFR CKD-EPI NON-AA MALE: Lab: 83

## 2018-04-24 LAB — BILIRUBIN, DIRECT: BILIRUBIN DIRECT: 0.3 mg/dL (ref 0.00–0.40)

## 2018-04-24 LAB — NEUTROPHILS ABSOLUTE COUNT: Lab: 4

## 2018-04-24 NOTE — Unmapped (Signed)
Faxed thru Epic updated standing lab order to [facility].

## 2018-04-24 NOTE — Unmapped (Signed)
Per provider, the patient received the Influenza and Shingles vaccine.  Patient ID verified with name and date of birth.  All screening questions were answered.  Vaccine(s) were administered as ordered.  See immunization history for documentation.  Patient tolerated the injection(s) well with no issues noted.  Vaccine Information sheet given to the patient.

## 2018-04-24 NOTE — Unmapped (Signed)
[facility], [facility], [place]  [address]  [place], [place]  [address]  Ph: [phone]  Fax: [phone]    04/24/2018    Patient Care Team:  Generic External Data Provider as PCP - General  Genia Harold, RN as Registered Nurse (Transplant)    RE: Richard [name]; DOB: October 17, 1965        Reason for visit: Status post OLT      HPI: Richard [name] is a pleasant 52 year old Caucasian gentleman who is status post OLT on 03/02/2012 for NAFLD cirrhosis. He has had no episodes of rejection biliary strictures. He is currently doing well on tacrolimus 1 mg in the a.m. and 0.5 mg in the p.m. as well as Myfortic 180 mg twice a day. He underwent a colonoscopy and upper endoscopy on 07/13/2015. He has not seen dermatology this year. Patient does state over the past year he has been diagnosed with diabetes. He has been working on his diet and weight and is currently has a BMI 38. Today in clinic he overall feels well and denies any fever, chills, headache, jaundice, chest pain, upper lower GI bleeding, melena or confusion.          PMH:  Patient Active Problem List   Diagnosis   ??? Hypertension   ??? Immunosuppression (CMS-HCC)   ??? Acanthosis nigricans   ??? Acute renal failure (CMS-HCC)   ??? Renal insufficiency   ??? Hepatic cirrhosis (CMS-HCC)   ??? Coagulation defect (CMS-HCC)   ??? Type 2 diabetes mellitus with hyperglycemia, without long-term current use of insulin (CMS-HCC)   ??? Other sequelae of chronic liver disease   ??? Family history of malignant neoplasm of prostate   ??? Systolic murmur   ??? H/O gastroesophageal reflux (GERD)   ??? Hypogonadism in male   ??? Liver replaced by transplant (CMS-HCC)   ??? Morbid obesity with BMI of 45.0-49.9, adult (CMS-HCC)   ??? Obstructive sleep apnea syndrome   ??? Decreased platelet count (CMS-HCC)   ??? Proteinuria due to type 2 diabetes mellitus (CMS-HCC)   ??? Gastroenteritis   ??? Splenic artery aneurysm (CMS-HCC)   ??? Hiccups   ??? Hypophosphatemia   ??? Norovirus     Past Medical History:   Diagnosis Date   ??? Diabetes mellitus (CMS-HCC)    ??? Hypertension    ??? Liver cirrhosis secondary to NASH (nonalcoholic steatohepatitis) (CMS-HCC)     s/p liver transplant 2013       PSH:  Past Surgical History:   Procedure Laterality Date   ??? liver transpant     ??? LIVER TRANSPLANTATION  2013   ??? PR UPPER GI ENDOSCOPY,BIOPSY N/A 07/13/2015    Procedure: UGI ENDOSCOPY; WITH BIOPSY, SINGLE OR MULTIPLE;  Surgeon: Joaquin Music, MD;  [facility];  Service: Gastroenterology       MEDICATIONS:  Current Outpatient Medications   Medication Sig Dispense Refill   ??? insulin lispro 100 unit/mL inph Inject 20 Units under the skin daily.     ??? aspirin (ECOTRIN) 81 MG tablet Take 81 mg by mouth daily.     ??? CHOLECALCIFEROL, VITAMIN D3, (VITAMIN D3 ORAL) Take 2,000 Units by mouth daily.     ??? glimepiride (AMARYL) 1 MG tablet 2 mg daily.      ??? lisinopril (PRINIVIL,ZESTRIL) 10 MG tablet Take 10 mg by mouth daily.     ??? metFORMIN (GLUCOPHAGE) 1000 MG tablet Take 500 mg by mouth 2 (two) times a  day with meals.     ??? multivitamin,tx-iron-minerals (COMPLETE MULTIVITAMIN) Tab Take by mouth daily.     ??? mycophenolate (MYFORTIC) 180 MG EC tablet TAKE 1 TABLET BY MOUTH TWICE DAILY 60 tablet 11   ??? tacrolimus (PROGRAF) 0.5 MG capsule TAKE 2 CAPSULES (1 MG) BY MOUTH IN THE MORNING AND 1 CAPSULE (0.5 MG) IN THE EVENING 90 capsule 11   ??? testosterone (ANDROGEL) 20.25 mg/1.25 gram (1.62 %) gel pump daily.       No current facility-administered medications for this visit.        ALLERGIES:  Insulin asp prt-insulin aspart and Insulin aspart        RoS: Negative on 10 systems review other than what is mentioned above.    PE: Blood pressure 169/92, pulse 62, temperature 36 ??C (96.8 ??F), temperature source Tympanic, height 177.8 cm (5' 10), weight (!) 120.4 kg (265 lb 6.4 oz), SpO2 99 %. Body mass index is 38.08 kg/m??.;  WDWN, no acute distress; No scleral icterus; no xanthelasma; oropharynx clear; neck supple without lymphadenopathy, carotid bruits or jugular venous distention; Lungs were clear to the bases by auscultation and percussion; Heart: regular rate and rhythm, normal S1, S2 without significant murmur or gallop..  Extremities: no clubbing, edema or palmer erythema.  Skin: no spider angiomata, no rash; Neuropsych: normal affect, speech pattern and cognitive function; oriented x 3; no asterixis.    LABS:  All lab results last 72 hours:    Recent Results (from the past 72 hour(s))   Gamma GT    Collection Time: 04/24/18  7:14 AM   Result Value Ref Range    GGT 41 12 - 109 U/L   Magnesium Level    Collection Time: 04/24/18  7:14 AM   Result Value Ref Range    Magnesium 1.3 (L) 1.6 - 2.2 mg/dL   Phosphorus Level    Collection Time: 04/24/18  7:14 AM   Result Value Ref Range    Phosphorus 3.3 2.9 - 4.7 mg/dL   Bilirubin, Direct    Collection Time: 04/24/18  7:14 AM   Result Value Ref Range    Bilirubin, Direct 0.30 0.00 - 0.40 mg/dL   Comprehensive Metabolic Panel    Collection Time: 04/24/18  7:14 AM   Result Value Ref Range    Sodium 142 135 - 145 mmol/L    Potassium 4.3 3.5 - 5.0 mmol/L    Chloride 106 98 - 107 mmol/L    CO2 22.0 22.0 - 30.0 mmol/L    BUN 17 7 - 21 mg/dL    Creatinine 5.36 6.44 - 1.30 mg/dL    BUN/Creatinine Ratio 16     EGFR CKD-EPI Non-African American, Male 73 >=60 mL/min/1.68m2    EGFR CKD-EPI African American, Male >90 >=60 mL/min/1.74m2    Glucose 156 (H) 65 - 99 mg/dL    Calcium 9.5 8.5 - 03.4 mg/dL    Albumin 4.1 3.5 - 5.0 g/dL    Total Protein 7.2 6.5 - 8.3 g/dL    Total Bilirubin 0.9 0.0 - 1.2 mg/dL    AST 44 19 - 55 U/L    ALT 53 19 - 72 U/L    Alkaline Phosphatase 85 38 - 126 U/L    Anion Gap 14 7 - 15 mmol/L   CBC w/ Differential    Collection Time: 04/24/18  7:14 AM   Result Value Ref Range    WBC 6.0 4.5 - 11.0 10*9/L    RBC 4.76 4.50 - 5.90  10*12/L    HGB 13.1 (L) 13.5 - 17.5 g/dL    HCT 16.1 (L) 09.6 - 53.0 %    MCV 82.5 80.0 - 100.0 fL    MCH 27.6 26.0 - 34.0 pg    MCHC 33.4 31.0 - 37.0 g/dL    RDW 04.5 40.9 - 81.1 %    MPV 6.8 (L) 7.0 - 10.0 fL    Platelet 254 150 - 440 10*9/L    Neutrophils % 66.7 %    Lymphocytes % 22.0 %    Monocytes % 5.7 %    Eosinophils % 3.2 %    Basophils % 0.4 %    Absolute Neutrophils 4.0 2.0 - 7.5 10*9/L    Absolute Lymphocytes 1.3 (L) 1.5 - 5.0 10*9/L    Absolute Monocytes 0.3 0.2 - 0.8 10*9/L    Absolute Eosinophils 0.2 0.0 - 0.4 10*9/L    Absolute Basophils 0.0 0.0 - 0.1 10*9/L    Large Unstained Cells 2 0 - 4 %       ASSESSMENT: Richard [name] is a pleasant 52 year old Caucasian gentleman who is status post OLT on 03/02/2012 for NAFLD cirrhosis. He has had no episodes of rejection biliary strictures. He is currently doing well on tacrolimus 1 mg in the a.m. and 0.5 mg in the p.m. as well as Myfortic 180 mg twice a day. He underwent a colonoscopy and upper endoscopy on 07/13/2015. He has not seen dermatology this year. Patient does state over the past year he has been diagnosed with diabetes. He has been working on his diet and weight and is currently has a BMI 38.        PLAN:  1. I have seen and examined this patient today in clinic and discussed his care with Dr. Jason Coop.  2. Check laboratory studies.  3. We will check the results of today's liver ultrasound and chest x-ray.  4. Continue all medications as directed.  5. Continue diet and exercise program.  6. Plan to see Mr. Kirtland Bouchard back in liver clinic in 1 year.  7. Recommend annual dermatology surveillance.      Tina Griffiths, PA-C  [facility]  [address]  [place], [place]  [address]  Ph: [phone]  Fax: [phone]

## 2018-04-25 NOTE — Unmapped (Signed)
Richard [name] 's MYCOPHENOLATE AND PROGRAF shipment will be delayed due to Refill too soon until 04/26/18 We have contacted the patient and communicated the delivery change to patient/caregiver We will reschedule the medication for the delivery date that the patient agreed upon. We have confirmed the delivery date as 04/29/18 .

## 2018-04-25 NOTE — Unmapped (Signed)
Annual imaging reviewed by Dr.Desai without recommendation for f/u before next annual.

## 2018-04-25 NOTE — Unmapped (Signed)
Patient seen in clinic today for his annual liver txp f/u. He reports he is doing well, working part time in [employer]. He denies any recent hospitalizations or new medical dx, stating he has not had insurance and has not seen a pcp in some time. He says he does qualify now for [facility] He denied any issues with n/v/d/constipation, fever/chills, SOB or swelling. His wt has been stable over the last year, although he reports he has been working on making better food choices. He admits he is drinking less than the recommended 80-100 oz daily, but agreed to workin on improving this, since he was educated on the importance for his kidney health. Patient is due for a colonoscopy in 2020, and is aware he should be seen by a dermatologist and his pcp for DM management. With new [facility] care, [place] approved of referrals for both an internist and derm today. Spent about 10 minutes educating patient on post-txp care, including infection prophy and cancer screening. He agreed to receive his 2nd shingrix today and flu vaccine. Imaging completed before appt and pt.aware he will be contacted with concerns, once all results are reviewed. Also encouraged him to contact coordinator in 2 wks if he has not yet heard form derm/internal med referrals. He verbalized understanding of all discussed.

## 2018-04-26 MED FILL — MYCOPHENOLATE SODIUM 180 MG TABLET,DELAYED RELEASE: 30 days supply | Qty: 60 | Fill #2 | Status: AC

## 2018-04-26 MED FILL — PROGRAF 0.5 MG CAPSULE: 30 days supply | Qty: 90 | Fill #1

## 2018-04-26 MED FILL — PROGRAF 0.5 MG CAPSULE: 30 days supply | Qty: 90 | Fill #1 | Status: AC

## 2018-04-26 MED FILL — MYCOPHENOLATE SODIUM 180 MG TABLET,DELAYED RELEASE: ORAL | 30 days supply | Qty: 60 | Fill #2

## 2018-05-16 NOTE — Unmapped (Signed)
[facility] Refill Coordination Note    Specialty Medication(s) to be Shipped:   Transplant:  mycophenolic acid 180mg  and Prograf 0.5mg      Richard [name], DOB: [birth year]  Phone: [phone] (home)     All above HIPAA information was verified with patient.     Completed refill call assessment today to schedule patient's medication shipment from the [facility] [phone]).       Specialty medication(s) and dose(s) confirmed: Regimen is correct and unchanged.   Changes to medications: Ethelbert reports no changes reported at this time.  Changes to insurance: No  Questions for the pharmacist: No    The patient will receive a drug information handout for each medication shipped and additional FDA Medication Guides as required.      DISEASE/MEDICATION-SPECIFIC INFORMATION        N/A    ADHERENCE     Medication Adherence    Patient reported X missed doses in the last month:  0  Specialty Medication:  Mycophenolate 180mg  & Prograf 0.5mg   Patient is on additional specialty medications:  No  Patient is on more than two specialty medications:  No  Any gaps in refill history greater than 2 weeks in the last 3 months:  no  Demonstrates understanding of importance of adherence:  yes  Informant:  patient  Reliability of informant:  reliable      Adherence tools used:  patient uses a pill box to manage medications          Confirmed plan for next specialty medication refill:  delivery by pharmacy          Refill Coordination    Has the Patients' Contact Information Changed:  No  Is the Shipping Address Different:  No         MEDICARE PART B DOCUMENTATION     Mycophenolic acid 180mg : Patient has 12 days worth of tablets on hand.  Prograf 0.5mg : Patient has 12 days worth of capsules on hand.    SHIPPING     Shipping address confirmed in Epic.     Delivery Scheduled: Yes, Expected medication delivery date: 05/28/2018 via UPS or courier.     Medication will be delivered via UPS to the home address in Epic [place].    Marrio Scribner P Allena Katz   [facility] Shared [facility] Specialty Technician

## 2018-05-22 MED FILL — PROGRAF 0.5 MG CAPSULE: 30 days supply | Qty: 90 | Fill #2 | Status: AC

## 2018-05-22 MED FILL — MYCOPHENOLATE SODIUM 180 MG TABLET,DELAYED RELEASE: 30 days supply | Qty: 60 | Fill #3 | Status: AC

## 2018-05-22 MED FILL — PROGRAF 0.5 MG CAPSULE: 30 days supply | Qty: 90 | Fill #2

## 2018-05-22 MED FILL — MYCOPHENOLATE SODIUM 180 MG TABLET,DELAYED RELEASE: ORAL | 30 days supply | Qty: 60 | Fill #3

## 2018-06-20 MED FILL — PROGRAF 0.5 MG CAPSULE: 30 days supply | Qty: 90 | Fill #3 | Status: AC

## 2018-06-20 MED FILL — MYCOPHENOLATE SODIUM 180 MG TABLET,DELAYED RELEASE: 30 days supply | Qty: 60 | Fill #4 | Status: AC

## 2018-06-20 MED FILL — PROGRAF 0.5 MG CAPSULE: 30 days supply | Qty: 90 | Fill #3

## 2018-06-20 MED FILL — MYCOPHENOLATE SODIUM 180 MG TABLET,DELAYED RELEASE: ORAL | 30 days supply | Qty: 60 | Fill #4

## 2018-06-20 NOTE — Unmapped (Signed)
[facility] Refill Coordination Note    Specialty Medication(s) to be Shipped:   Transplant:  mycophenolic acid 180mg  and Prograf 0.5mg     Other medication(s) to be shipped: none     Richard [name], DOB: April 20, 1966  Phone: [phone] (home)       All above HIPAA information was verified with patient.     Completed refill call assessment today to schedule patient's medication shipment from the [facility] [phone]).       Specialty medication(s) and dose(s) confirmed: Regimen is correct and unchanged.   Changes to medications: Katlin reports no changes reported at this time.  Changes to insurance: No  Questions for the pharmacist: No    The patient will receive a drug information handout for each medication shipped and additional FDA Medication Guides as required.      DISEASE/MEDICATION-SPECIFIC INFORMATION        N/A    ADHERENCE     Medication Adherence    Patient reported X missed doses in the last month:  0      Adherence tools used:  patient uses a pill box to manage medications                      MEDICARE PART B DOCUMENTATION     Mycophenolic acid 180mg : Patient has 2 days worth of tablets on hand.  Prograf 0.5mg : Patient has 2 days worth of capsules on hand.    SHIPPING     Shipping address confirmed in Epic.     Delivery Scheduled: Yes, Expected medication delivery date: 06/21/18   via UPS or courier.     Medication will be delivered via Next Day Courier to the home address in Epic WAM.    [place] A Jamesha Ellsworth   [facility] Shared [facility] Specialty Technician

## 2018-07-08 NOTE — Unmapped (Signed)
[facility] Refill and Clinical Coordination Note  Medication(s): mycophenolate, prograf    Richard [name], DOB: July 02, 1965  Phone: [phone] (home) , Alternate phone contact: N/A  Shipping address: [address]  [name] [address]  Phone or address changes today?: No  All above HIPAA information verified.  Insurance changes? No    Completed refill and clinical call assessment today to schedule patient's medication shipment from the [facility] [phone]).      MEDICATION RECONCILIATION    Confirmed the medication and dosage are correct and have not changed: Yes, regimen is correct and unchanged.    Were there any changes to your medication(s) in the past month:  No, there are no changes reported at this time.    ADHERENCE    Is this medicine transplant or covered by Medicare Part B? Yes.    Mycophenolic acid 180mg : Patient has 10 days worth of tablets on hand.  Prograf 0.5mg : Patient has 10 days worth of capsules on hand.    Did you miss any doses in the past 4 weeks? No missed doses reported.  Adherence counseling provided? Not needed     SIDE EFFECT MANAGEMENT    Are you tolerating your medication?:  Richard [name] reports tolerating the medication.  Side effect management discussed: None      Therapy is appropriate and should be continued.    Evidence of clinical benefit: Do you feel that that the medication is helping? Yes      FINANCIAL/SHIPPING    Delivery Scheduled: Yes, Expected medication delivery date: 07/16/2018     Medication will be delivered via Same Day Courier to the home address in [facility]. Per pt request    Additional medications refilled: No additional medications/refills needed at this time.    The patient will receive a drug information handout for each medication shipped and additional FDA Medication Guides as required.      Richard [name] did not have any additional questions at this time.    Delivery address confirmed in Epic.     We will follow up with patient monthly for standard refill processing and delivery.      Thank you,  [name]   [facility] Shared [facility] Specialty Pharmacist

## 2018-07-09 MED ORDER — PEG-ELECTROLYTE SOLUTION 420 GRAM ORAL SOLUTION
0 refills | 0 days | Status: CP
Start: 2018-07-09 — End: 2018-08-08

## 2018-07-09 NOTE — Unmapped (Signed)
bowel prep ordered

## 2018-07-15 MED FILL — PROGRAF 0.5 MG CAPSULE: 30 days supply | Qty: 90 | Fill #4

## 2018-07-15 MED FILL — MYCOPHENOLATE SODIUM 180 MG TABLET,DELAYED RELEASE: 30 days supply | Qty: 60 | Fill #5 | Status: AC

## 2018-07-15 MED FILL — PROGRAF 0.5 MG CAPSULE: 30 days supply | Qty: 90 | Fill #4 | Status: AC

## 2018-07-15 MED FILL — MYCOPHENOLATE SODIUM 180 MG TABLET,DELAYED RELEASE: ORAL | 30 days supply | Qty: 60 | Fill #5

## 2018-08-05 NOTE — Unmapped (Signed)
[facility] Refill Coordination Note    Specialty Medication(s) to be Shipped:   Transplant:  mycophenolic acid 180mg  and Prograf 0.5mg     Other medication(s) to be shipped: none     RAMSAY BOGNAR, DOB: [birth year]  Phone: [phone] (home)       All above HIPAA information was verified with patient.     Completed refill call assessment today to schedule patient's medication shipment from the [facility] [phone]).       Specialty medication(s) and dose(s) confirmed: Regimen is correct and unchanged.   Changes to medications: Azahel reports no changes reported at this time.  Changes to insurance: No  Questions for the pharmacist: No    The patient will receive a drug information handout for each medication shipped and additional FDA Medication Guides as required.      DISEASE/MEDICATION-SPECIFIC INFORMATION        N/A    ADHERENCE     Medication Adherence    Patient reported X missed doses in the last month:  0  Adherence tools used:  patient uses a pill box to manage medications              MEDICARE PART B DOCUMENTATION     Mycophenolic acid 180mg : Patient has 10 days worth of tablets on hand.  Prograf 0.5mg : Patient has 10 days worth of capsules on hand.    SHIPPING     Shipping address confirmed in Epic.     Delivery Scheduled: Yes, Expected medication delivery date: 08/09/18 via UPS or courier.     Medication will be delivered via UPS to the home address in [place].    [place] A Olanda Boughner   [facility] Shared [facility] Specialty Technician

## 2018-08-08 MED ORDER — PEG-ELECTROLYTE SOLUTION 420 GRAM ORAL SOLUTION
0 refills | 0 days | Status: CP
Start: 2018-08-08 — End: 2019-08-08
  Filled 2018-08-08: qty 4000, 1d supply, fill #0

## 2018-08-08 MED FILL — MYCOPHENOLATE SODIUM 180 MG TABLET,DELAYED RELEASE: ORAL | 30 days supply | Qty: 60 | Fill #6

## 2018-08-08 MED FILL — PROGRAF 0.5 MG CAPSULE: 30 days supply | Qty: 90 | Fill #5

## 2018-08-08 MED FILL — MYCOPHENOLATE SODIUM 180 MG TABLET,DELAYED RELEASE: 30 days supply | Qty: 60 | Fill #6 | Status: AC

## 2018-08-08 MED FILL — PROGRAF 0.5 MG CAPSULE: 30 days supply | Qty: 90 | Fill #5 | Status: AC

## 2018-08-08 MED FILL — PEG-ELECTROLYTE SOLUTION 420 GRAM ORAL SOLUTION: 1 days supply | Qty: 4000 | Fill #0 | Status: AC

## 2018-08-13 ENCOUNTER — Ambulatory Visit: Admit: 2018-08-13 | Discharge: 2018-08-13

## 2018-08-13 ENCOUNTER — Encounter: Admit: 2018-08-13 | Discharge: 2018-08-13 | Attending: Anesthesiology | Primary: Anesthesiology

## 2018-08-13 DIAGNOSIS — Z1211 Encounter for screening for malignant neoplasm of colon: Principal | ICD-10-CM

## 2018-08-15 NOTE — Unmapped (Signed)
Patient completed his colonoscopy this week. Sent bx results to PA Estelline who recommended he repeat his colonoscopy in 5 years.

## 2018-08-26 DIAGNOSIS — Z944 Liver transplant status: Principal | ICD-10-CM

## 2018-08-26 DIAGNOSIS — E612 Magnesium deficiency: Principal | ICD-10-CM

## 2018-08-26 DIAGNOSIS — Z5181 Encounter for therapeutic drug level monitoring: Principal | ICD-10-CM

## 2018-08-28 DIAGNOSIS — K635 Polyp of colon: Principal | ICD-10-CM

## 2018-08-29 NOTE — Unmapped (Signed)
error 

## 2018-09-02 DIAGNOSIS — E612 Magnesium deficiency: Principal | ICD-10-CM

## 2018-09-02 DIAGNOSIS — Z944 Liver transplant status: Principal | ICD-10-CM

## 2018-09-02 DIAGNOSIS — Z5181 Encounter for therapeutic drug level monitoring: Principal | ICD-10-CM

## 2018-09-05 NOTE — Unmapped (Signed)
Patient due for repeat colonoscopy d/t poor prep on prior. Procedure ordered.

## 2018-09-09 DIAGNOSIS — Z5181 Encounter for therapeutic drug level monitoring: Principal | ICD-10-CM

## 2018-09-09 DIAGNOSIS — E612 Magnesium deficiency: Principal | ICD-10-CM

## 2018-09-09 DIAGNOSIS — Z944 Liver transplant status: Principal | ICD-10-CM

## 2018-09-09 NOTE — Unmapped (Signed)
[facility] Refill Coordination Note  Medication: Mycophenolic 180mg  & Prograf 0.5mg     Unable to reach patient to schedule shipment for medication being filled at [facility]. The phone line is always busy..  As this is the 3rd unsuccessful attempt to reach the patient, no additional phone call attempts will be made at this time.      Phone numbers attempted: [phone] (H)    Last scheduled delivery: 08/08/2018    Please call the [facility] at [phone] (option 4) should you have any further questions.      Thanks,  [facility] Shared [facility] Specialty Team

## 2018-09-16 DIAGNOSIS — E612 Magnesium deficiency: Principal | ICD-10-CM

## 2018-09-16 DIAGNOSIS — Z944 Liver transplant status: Principal | ICD-10-CM

## 2018-09-16 DIAGNOSIS — Z5181 Encounter for therapeutic drug level monitoring: Principal | ICD-10-CM

## 2018-09-23 DIAGNOSIS — Z944 Liver transplant status: Principal | ICD-10-CM

## 2018-09-23 DIAGNOSIS — Z5181 Encounter for therapeutic drug level monitoring: Principal | ICD-10-CM

## 2018-09-23 DIAGNOSIS — E612 Magnesium deficiency: Principal | ICD-10-CM

## 2018-10-25 MED FILL — MYCOPHENOLATE SODIUM 180 MG TABLET,DELAYED RELEASE: ORAL | 30 days supply | Qty: 60 | Fill #7

## 2018-10-25 MED FILL — PROGRAF 0.5 MG CAPSULE: 30 days supply | Qty: 90 | Fill #6

## 2018-10-25 MED FILL — MYCOPHENOLATE SODIUM 180 MG TABLET,DELAYED RELEASE: 30 days supply | Qty: 60 | Fill #7 | Status: AC

## 2018-10-25 MED FILL — PROGRAF 0.5 MG CAPSULE: 30 days supply | Qty: 90 | Fill #6 | Status: AC

## 2018-10-25 NOTE — Unmapped (Signed)
Pt provided new ph#[phone]-could not update in Epic. Changed in [facility]    [facility] Refill Coordination Note    Specialty Medication(s) to be Shipped:   Transplant: Myfortic 180mg  and Prograf 0.5mg     Other medication(s) to be shipped: none     Richard [name], DOB: 10/03/1965  Phone: [phone] (home)       All above HIPAA information was verified with patient.     Completed refill call assessment today to schedule patient's medication shipment from the [facility] [phone]).       Specialty medication(s) and dose(s) confirmed: Regimen is correct and unchanged.   Changes to medications: Richard [name] reports no changes at this time.  Changes to insurance: No  Questions for the pharmacist: No    Confirmed patient received Welcome Packet with first shipment. The patient will receive a drug information handout for each medication shipped and additional FDA Medication Guides as required.       DISEASE/MEDICATION-SPECIFIC INFORMATION        N/A    SPECIALTY MEDICATION ADHERENCE     Medication Adherence    Adherence tools used:  patient uses a pill box to manage medications            Myfortic 180 mg: 0 days of medicine on hand   Prograt 0.5 mg: 0 days of medicine on hand       SHIPPING     Shipping address confirmed in Epic.     Delivery Scheduled: Yes, Expected medication delivery date: 10/25/18.     Medication will be delivered via Same Day Courier to the home address in [place].    Richard [name] A [facility] Specialty Pharmacist

## 2018-11-14 NOTE — Unmapped (Signed)
[facility] Refill Coordination Note    Specialty Medication(s) to be Shipped:   Transplant:  mycophenolic acid 180mg  and Prograf 0.5mg     Other medication(s) to be shipped:      Richard [name], DOB: December 18, 1965  Phone: [phone] (home)       All above HIPAA information was verified with patient.     Completed refill call assessment today to schedule patient's medication shipment from the [facility] [phone]).       Specialty medication(s) and dose(s) confirmed: Regimen is correct and unchanged.   Changes to medications: Peter reports no changes at this time.  Changes to insurance: No  Questions for the pharmacist: No    Confirmed patient received Welcome Packet with first shipment. The patient will receive a drug information handout for each medication shipped and additional FDA Medication Guides as required.       DISEASE/MEDICATION-SPECIFIC INFORMATION        N/A    SPECIALTY MEDICATION ADHERENCE     Medication Adherence    Patient reported X missed doses in the last month:  0  Specialty Medication:  mycophenolate  Patient is on additional specialty medications:  Yes  Additional Specialty Medications:  prograf  Patient Reported Additional Medication X Missed Doses in the Last Month:  0  Patient is on more than two specialty medications:  No  Adherence tools used:  patient uses a pill box to manage medications                mycophenolate 180 mg: 6 days of medicine on hand   prograf 0.5 mg: 6 days of medicine on hand         SHIPPING     Shipping address confirmed in Epic.     Delivery Scheduled: Yes, Expected medication delivery date: 11/20/18.     Medication will be delivered via UPS to the home address in [place].    Oralia Rud   [facility] Specialty Technician

## 2018-11-19 MED FILL — MYCOPHENOLATE SODIUM 180 MG TABLET,DELAYED RELEASE: ORAL | 30 days supply | Qty: 60 | Fill #8

## 2018-11-19 MED FILL — PROGRAF 0.5 MG CAPSULE: 30 days supply | Qty: 90 | Fill #7 | Status: AC

## 2018-11-19 MED FILL — MYCOPHENOLATE SODIUM 180 MG TABLET,DELAYED RELEASE: 30 days supply | Qty: 60 | Fill #8 | Status: AC

## 2018-11-19 MED FILL — PROGRAF 0.5 MG CAPSULE: 30 days supply | Qty: 90 | Fill #7

## 2018-12-09 NOTE — Unmapped (Signed)
[facility] Shared [facility] Pharm[facility] Clinical Assessment & Refill Coordination Note    Richard [name], DOB: [birth year]  Phone: [phone] (home)     All above HIPAA information was verified with patient.     Specialty Medication(s):   Transplant:  mycophenolic acid 180mg  and Prograf 0.5mg      Current Outpatient Medications   Medication Sig Dispense Refill   ??? aspirin (ECOTRIN) 81 MG tablet Take 81 mg by mouth daily.     ??? CHOLECALCIFEROL, VITAMIN D3, (VITAMIN D3 ORAL) Take 2,000 Units by mouth daily.     ??? glimepiride (AMARYL) 1 MG tablet 2 mg daily.      ??? insulin lispro 100 unit/mL inph Inject 20 Units under the skin daily.     ??? lisinopril (PRINIVIL,ZESTRIL) 10 MG tablet Take 10 mg by mouth daily.     ??? metFORMIN (GLUCOPHAGE) 1000 MG tablet Take 500 mg by mouth 2 (two) times a day with meals.     ??? multivitamin,tx-iron-minerals (COMPLETE MULTIVITAMIN) Tab Take by mouth daily.     ??? mycophenolate (MYFORTIC) 180 MG EC tablet TAKE 1 TABLET BY MOUTH TWICE DAILY 60 tablet 11   ??? peg-electrolyte soln (GOLYTELY) 420 gram SolR TAKE AS DIRECTED (Patient not taking: Reported on 12/09/2018) 4000 mL 0   ??? tacrolimus (PROGRAF) 0.5 MG capsule TAKE 2 CAPSULES (1 MG) BY MOUTH IN THE MORNING AND 1 CAPSULE (0.5 MG) IN THE EVENING 90 capsule 11   ??? testosterone (ANDROGEL) 20.25 mg/1.25 gram (1.62 %) gel pump daily.       No current facility-administered medications for this visit.         Changes to medications: Roc reports no changes at this time.    Allergies   Allergen Reactions   ??? Insulin Asp Prt-Insulin Aspart      Other reaction(s): Other  Burning on injection and skin reaction.     ??? Insulin Aspart Other (See Comments)     Burning on injection and skin rash (itchy)       Changes to allergies: No    SPECIALTY MEDICATION ADHERENCE     Mycophenolate 180mg   : 10 days of medicine on hand   Prograf 0.5mg   : 10 days of medicine on hand     Medication Adherence    Patient reported X missed doses in the last month:  0 Specialty Medication:  mycophenolate 180mg   Patient is on additional specialty medications:  Yes  Additional Specialty Medications:  Prograf 0.5mg   Patient Reported Additional Medication X Missed Doses in the Last Month:  0  Adherence tools used:  patient uses a pill box to manage medications          Specialty medication(s) dose(s) confirmed: Regimen is correct and unchanged.     Are there any concerns with adherence? No    Adherence counseling provided? Not needed    CLINICAL MANAGEMENT AND INTERVENTION      Clinical Benefit Assessment:    Do you feel the medicine is effective or helping your condition? Yes    Clinical Benefit counseling provided? Not needed    Adverse Effects Assessment:    Are you experiencing any side effects? No    Are you experiencing difficulty administering your medicine? No    Quality of Life Assessment:    How many days over the past month did your transplant  keep you from your normal activities? For example, brushing your teeth or getting up in the morning. 0    Have  you discussed this with your provider? Not needed    Therapy Appropriateness:    Is therapy appropriate? Yes, therapy is appropriate and should be continued    DISEASE/MEDICATION-SPECIFIC INFORMATION      N/A    PATIENT SPECIFIC NEEDS     ? Does the patient have any physical, cognitive, or cultural barriers? No    ? Is the patient high risk? Yes, patient taking a REMS drug     ? Does the patient require a Care Management Plan? No     ? Does the patient require physician intervention or other additional services (i.e. nutrition, smoking cessation, social work)? No      SHIPPING     Specialty Medication(s) to be Shipped:   Transplant:  mycophenolic acid 180mg  and Prograf 0.5mg     Other medication(s) to be shipped: none - patient states all other meds come from outside pharmacy     Changes to insurance: No    Delivery Scheduled: Yes, Expected medication delivery date: 12/17/2018.     Medication will be delivered via UPS to the confirmed home address in [facility]    The patient will receive a drug information handout for each medication shipped and additional FDA Medication Guides as required.  Verified that patient has previously received a Conservation officer, historic buildings.    All of the patient's questions and concerns have been addressed.    [name]   [facility] Specialty Pharmacist

## 2018-12-13 NOTE — Unmapped (Signed)
PT request we send medications to Rx address. Scheduled to be filled 12/16/18.

## 2018-12-16 MED FILL — MYCOPHENOLATE SODIUM 180 MG TABLET,DELAYED RELEASE: ORAL | 30 days supply | Qty: 60 | Fill #9

## 2018-12-16 MED FILL — MYCOPHENOLATE SODIUM 180 MG TABLET,DELAYED RELEASE: 30 days supply | Qty: 60 | Fill #9 | Status: AC

## 2018-12-16 MED FILL — PROGRAF 0.5 MG CAPSULE: 30 days supply | Qty: 90 | Fill #8 | Status: AC

## 2018-12-16 MED FILL — PROGRAF 0.5 MG CAPSULE: 30 days supply | Qty: 90 | Fill #8

## 2018-12-23 MED ORDER — MYCOPHENOLATE SODIUM 180 MG TABLET,DELAYED RELEASE
ORAL_TABLET | Freq: Two times a day (BID) | ORAL | 0 refills | 0.00000 days
Start: 2018-12-23 — End: 2018-12-23

## 2018-12-23 MED ORDER — TACROLIMUS 0.5 MG CAPSULE
ORAL_CAPSULE | ORAL | 0 refills | 0 days
Start: 2018-12-23 — End: 2018-12-23

## 2018-12-23 MED FILL — PROGRAF 0.5 MG CAPSULE: 30 days supply | Qty: 90 | Fill #0 | Status: AC

## 2018-12-23 MED FILL — MYCOPHENOLATE SODIUM 180 MG TABLET,DELAYED RELEASE: ORAL | 30 days supply | Qty: 60 | Fill #0

## 2018-12-23 MED FILL — MYCOPHENOLATE SODIUM 180 MG TABLET,DELAYED RELEASE: 30 days supply | Qty: 60 | Fill #0 | Status: AC

## 2018-12-23 MED FILL — PROGRAF 0.5 MG CAPSULE: ORAL | 30 days supply | Qty: 90 | Fill #0

## 2019-01-07 NOTE — Unmapped (Signed)
[facility] Refill Coordination Note    Specialty Medication(s) to be Shipped:   Transplant:  mycophenolic acid 180mg  and Prograf 0.5mg      Richard [name], DOB: 06-26-1966  Phone: [phone] (home)     All above HIPAA information was verified with patient.     Completed refill call assessment today to schedule patient's medication shipment from the [facility] [phone]).       Specialty medication(s) and dose(s) confirmed: Regimen is correct and unchanged.   Changes to medications: Richard [name] reports no changes reported at this time.  Changes to insurance: No  Questions for the pharmacist: No    Confirmed patient received Welcome Packet with first shipment. The patient will receive a drug information handout for each medication shipped and additional FDA Medication Guides as required.       DISEASE/MEDICATION-SPECIFIC INFORMATION        N/A    SPECIALTY MEDICATION ADHERENCE     Medication Adherence    Patient reported X missed doses in the last month: 0  Specialty Medication: Mycophenolate 180mg   Patient is on additional specialty medications: Yes  Additional Specialty Medications: Prograf 0.5mg   Patient Reported Additional Medication X Missed Doses in the Last Month: 0  Patient is on more than two specialty medications: No  Adherence tools used: patient uses a pill box to manage medications        Mycophenolate 180 mg: 13 days of medicine on hand   Prograf 0.5 mg: 13 days of medicine on hand     SHIPPING     Shipping address confirmed in Epic.     Delivery Scheduled: Yes, Expected medication delivery date: 01/21/2019.     Medication will be delivered via UPS to the prescription address in Epic [place].    Richard [name] Shared [facility] Specialty Technician

## 2019-01-20 MED FILL — MYCOPHENOLATE SODIUM 180 MG TABLET,DELAYED RELEASE: 30 days supply | Qty: 60 | Fill #10 | Status: AC

## 2019-01-20 MED FILL — MYCOPHENOLATE SODIUM 180 MG TABLET,DELAYED RELEASE: ORAL | 30 days supply | Qty: 60 | Fill #10

## 2019-01-20 MED FILL — PROGRAF 0.5 MG CAPSULE: 30 days supply | Qty: 90 | Fill #9

## 2019-01-20 MED FILL — PROGRAF 0.5 MG CAPSULE: 30 days supply | Qty: 90 | Fill #9 | Status: AC

## 2019-02-11 NOTE — Unmapped (Signed)
[facility] Pharmacy Refill Coordination Note    Specialty Medication(s) to be Shipped:   Transplant:  mycophenolic acid 180mg  and Prograf 0.5mg      Richard [name], DOB: 01-27-1966  Phone: [phone] (home)     All above HIPAA information was verified with patient.     Completed refill call assessment today to schedule patient's medication shipment from the [facility] [phone]).       Specialty medication(s) and dose(s) confirmed: Regimen is correct and unchanged.   Changes to medications: Mitchell reports no changes at this time.  Changes to insurance: No  Questions for the pharmacist: No    Confirmed patient received Welcome Packet with first shipment. The patient will receive a drug information handout for each medication shipped and additional FDA Medication Guides as required.       DISEASE/MEDICATION-SPECIFIC INFORMATION        N/A    SPECIALTY MEDICATION ADHERENCE     Medication Adherence    Patient reported X missed doses in the last month: 0  Specialty Medication: Mycophenolic 180mg   Patient is on additional specialty medications: Yes  Additional Specialty Medications: Prograf 0.5mg   Patient Reported Additional Medication X Missed Doses in the Last Month: 0  Patient is on more than two specialty medications: No  Adherence tools used: patient uses a pill box to manage medications        Mycophenolic 180 mg: 10 days of medicine on hand   Prograf 0.5 mg: 10 days of medicine on hand     SHIPPING     Shipping address confirmed in Epic.     Delivery Scheduled: Yes, Expected medication delivery date: 02/21/2019.     Medication will be delivered via UPS to the home address in Epic [place].    Richard [name] Richard [name]   [facility] Shared [facility] Specialty Technician

## 2019-02-20 MED ORDER — MYCOPHENOLATE SODIUM 180 MG TABLET,DELAYED RELEASE
ORAL_TABLET | Freq: Two times a day (BID) | ORAL | 11 refills | 30 days | Status: CP
Start: 2019-02-20 — End: ?
  Filled 2019-02-21: qty 60, 30d supply, fill #0

## 2019-02-21 MED FILL — PROGRAF 0.5 MG CAPSULE: 30 days supply | Qty: 90 | Fill #10 | Status: AC

## 2019-02-21 MED FILL — PROGRAF 0.5 MG CAPSULE: 30 days supply | Qty: 90 | Fill #10

## 2019-02-21 MED FILL — MYCOPHENOLATE SODIUM 180 MG TABLET,DELAYED RELEASE: 30 days supply | Qty: 60 | Fill #0 | Status: AC

## 2019-02-21 NOTE — Unmapped (Signed)
Richard [name] 's mycophenolate and prograf shipment will be delayed as a result of no refills remain on the prescription.      I have reached out to the patient and communicated the delivery change. We will reschedule the medication for the delivery date that the patient agreed upon.  We have confirmed the delivery date as 02/24/19, via ups.

## 2019-03-17 DIAGNOSIS — Z944 Liver transplant status: Secondary | ICD-10-CM

## 2019-03-17 NOTE — Unmapped (Signed)
[facility] Pharm[facility] Refill Coordination Note    Specialty Medication(s) to be Shipped:   Transplant: mycophenolate mofetil 180mg  and Prograf 0.5mg  (sent refill request)    Other medication(s) to be shipped: N/A     Richard [name], DOB: 1966-01-09  Phone: [phone] (home)       All above HIPAA information was verified with patient.     Completed refill call assessment today to schedule patient's medication shipment from the [facility] [phone]).       Specialty medication(s) and dose(s) confirmed: Regimen is correct and unchanged.   Changes to medications: Ely reports no changes at this time.  Changes to insurance: No  Questions for the pharmacist: No    Confirmed patient received Welcome Packet with first shipment. The patient will receive a drug information handout for each medication shipped and additional FDA Medication Guides as required.       DISEASE/MEDICATION-SPECIFIC INFORMATION        N/A    SPECIALTY MEDICATION ADHERENCE     Medication Adherence    Patient reported X missed doses in the last month: 0  Specialty Medication: mycophenolate 180mg   Patient is on additional specialty medications: Yes  Additional Specialty Medications: Prograf 0.5mg   Patient Reported Additional Medication X Missed Doses in the Last Month: 0  Patient is on more than two specialty medications: No  Adherence tools used: patient uses a pill box to manage medications          Mycophenolate 180 mg: 7 days of medicine on hand   Prograf 0.5 mg: 7 days of medicine on hand     SHIPPING     Shipping address confirmed in Epic.     Delivery Scheduled: Yes, Expected medication delivery date: 03/21/2019.  However, Rx request for refills was sent to the provider as there are none remaining.     Medication will be delivered via UPS to the home address in Epic [place].    Oretha Milch   [facility] Pharm[facility] Specialty Technician

## 2019-03-18 DIAGNOSIS — Z5181 Encounter for therapeutic drug level monitoring: Secondary | ICD-10-CM

## 2019-03-18 DIAGNOSIS — E612 Magnesium deficiency: Secondary | ICD-10-CM

## 2019-03-18 DIAGNOSIS — Z944 Liver transplant status: Secondary | ICD-10-CM

## 2019-03-18 MED ORDER — TACROLIMUS 0.5 MG CAPSULE
ORAL_CAPSULE | 1 refills | 0 days | Status: CP
Start: 2019-03-18 — End: ?
  Filled 2019-03-20: qty 90, 30d supply, fill #0

## 2019-03-18 NOTE — Unmapped (Signed)
Received refill request for patient's tacrolimus. Patient has not completed labs in nearly 1 year, at his annual. Spoke to him this morning and explained that we can only refill tacrolimus for 2 months until he completes labs and makes an appt for an annual. He said he would get labs tomorrow morning. Gave him number for tpa to schedule annual as well.

## 2019-03-18 NOTE — Unmapped (Signed)
TRF

## 2019-03-19 NOTE — Unmapped (Signed)
Returned pt's call from yesterday and scheduled his annual appt. Pt scheduled for virtual clinic visit due to Covid-19 and explained to him how doximity works. Pt wants letter thru mychart and verbalized understanding of all discussed.

## 2019-03-20 MED FILL — PROGRAF 0.5 MG CAPSULE: 30 days supply | Qty: 90 | Fill #0 | Status: AC

## 2019-03-20 MED FILL — MYCOPHENOLATE SODIUM 180 MG TABLET,DELAYED RELEASE: ORAL | 30 days supply | Qty: 60 | Fill #1

## 2019-03-20 MED FILL — MYCOPHENOLATE SODIUM 180 MG TABLET,DELAYED RELEASE: 30 days supply | Qty: 60 | Fill #1 | Status: AC

## 2019-03-21 DIAGNOSIS — Z944 Liver transplant status: Secondary | ICD-10-CM

## 2019-03-21 DIAGNOSIS — Z5181 Encounter for therapeutic drug level monitoring: Secondary | ICD-10-CM

## 2019-03-28 ENCOUNTER — Ambulatory Visit: Admit: 2019-03-28 | Discharge: 2019-03-29

## 2019-03-28 DIAGNOSIS — Z5181 Encounter for therapeutic drug level monitoring: Secondary | ICD-10-CM

## 2019-03-28 DIAGNOSIS — Z944 Liver transplant status: Secondary | ICD-10-CM

## 2019-03-28 LAB — CBC W/ AUTO DIFF
BASOPHILS ABSOLUTE COUNT: 0 10*9/L (ref 0.0–0.1)
BASOPHILS RELATIVE PERCENT: 0.3 %
EOSINOPHILS ABSOLUTE COUNT: 0.1 10*9/L (ref 0.0–0.4)
EOSINOPHILS RELATIVE PERCENT: 1.7 %
HEMATOCRIT: 42.5 % (ref 41.0–53.0)
HEMOGLOBIN: 13.9 g/dL (ref 13.5–17.5)
LARGE UNSTAINED CELLS: 2 % (ref 0–4)
LYMPHOCYTES ABSOLUTE COUNT: 1.2 10*9/L — ABNORMAL LOW (ref 1.5–5.0)
LYMPHOCYTES RELATIVE PERCENT: 14.2 %
MEAN CORPUSCULAR HEMOGLOBIN CONC: 32.8 g/dL (ref 31.0–37.0)
MEAN CORPUSCULAR HEMOGLOBIN: 27.8 pg (ref 26.0–34.0)
MEAN CORPUSCULAR VOLUME: 84.7 fL (ref 80.0–100.0)
MEAN PLATELET VOLUME: 7.8 fL (ref 7.0–10.0)
MONOCYTES ABSOLUTE COUNT: 0.5 10*9/L (ref 0.2–0.8)
MONOCYTES RELATIVE PERCENT: 5.4 %
NEUTROPHILS ABSOLUTE COUNT: 6.5 10*9/L (ref 2.0–7.5)
NEUTROPHILS RELATIVE PERCENT: 76.3 %
RED BLOOD CELL COUNT: 5.02 10*12/L (ref 4.50–5.90)
WBC ADJUSTED: 8.5 10*9/L (ref 4.5–11.0)

## 2019-03-28 LAB — BILIRUBIN DIRECT: Bilirubin.glucuronidated+Bilirubin.albumin bound:MCnc:Pt:Ser/Plas:Qn:: 0.2

## 2019-03-28 LAB — COMPREHENSIVE METABOLIC PANEL
ALKALINE PHOSPHATASE: 104 U/L (ref 38–126)
ALT (SGPT): 33 U/L (ref <50)
ANION GAP: 11 mmol/L (ref 7–15)
BILIRUBIN TOTAL: 0.9 mg/dL (ref 0.0–1.2)
BLOOD UREA NITROGEN: 28 mg/dL — ABNORMAL HIGH (ref 7–21)
BUN / CREAT RATIO: 16
CALCIUM: 9.6 mg/dL (ref 8.5–10.2)
CHLORIDE: 103 mmol/L (ref 98–107)
CO2: 22 mmol/L (ref 22.0–30.0)
CREATININE: 1.78 mg/dL — ABNORMAL HIGH (ref 0.70–1.30)
EGFR CKD-EPI AA MALE: 50 mL/min/{1.73_m2} — ABNORMAL LOW (ref >=60)
EGFR CKD-EPI NON-AA MALE: 43 mL/min/{1.73_m2} — ABNORMAL LOW (ref >=60)
GLUCOSE RANDOM: 170 mg/dL — ABNORMAL HIGH (ref 70–99)
POTASSIUM: 3.8 mmol/L (ref 3.5–5.0)
PROTEIN TOTAL: 6.8 g/dL (ref 6.5–8.3)
SODIUM: 136 mmol/L (ref 135–145)

## 2019-03-28 LAB — NEUTROPHIL LEFT SHIFT

## 2019-03-28 LAB — MAGNESIUM: Magnesium:MCnc:Pt:Ser/Plas:Qn:: 1.2 — ABNORMAL LOW

## 2019-03-28 LAB — PHOSPHORUS: Phosphate:MCnc:Pt:Ser/Plas:Qn:: 3.9

## 2019-03-28 LAB — CO2: Carbon dioxide:SCnc:Pt:Ser/Plas:Qn:: 22

## 2019-03-28 LAB — GAMMA GLUTAMYL TRANSFERASE: Gamma glutamyl transferase:CCnc:Pt:Ser/Plas:Qn:: 48

## 2019-03-28 LAB — TACROLIMUS BLOOD: Lab: 6.4

## 2019-03-28 NOTE — Unmapped (Signed)
Reviewed pt's 03/28/2019 labs (tac pending) and noted pt's creatinine of 1.78 and the last time patient had labs was last year.   Called and spoke with patient letting him know that his creatinine is elevated at 1.78 in comparison to previous year. Mentioned that more frequent labs are needed so we can monitor changes such as this and intervene as needed.   Mentioned it might be due to not drinking much due to his scans today--patient said that he has not been drinking much. Discussed that the goal is 70-100oz of fluid a day which would be about 4-6 normal size [place] water bottles; mentioned if he sweats from heat/activity that he will need to increase his intake.   Aware his tac is pending.   Mentioned even if his tac returns as slightly higher that typically we would need to see another set of labs before making changes if any are needed.   He mentioned he would need to go to [facility] lab with charity care. Aware his standing labs are in system.   Verbalized understanding to get labs in a couple of weeks and he confirmed he also has a virtual provider visit next week.

## 2019-04-01 DIAGNOSIS — Z944 Liver transplant status: Secondary | ICD-10-CM

## 2019-04-01 DIAGNOSIS — Z79899 Other long term (current) drug therapy: Secondary | ICD-10-CM

## 2019-04-01 MED ORDER — MYCOPHENOLATE SODIUM 180 MG TABLET,DELAYED RELEASE
ORAL_TABLET | Freq: Two times a day (BID) | ORAL | 11 refills | 30 days | Status: CP
Start: 2019-04-01 — End: ?

## 2019-04-01 MED ORDER — MYFORTIC 180 MG TABLET,DELAYED RELEASE
ORAL_TABLET | Freq: Two times a day (BID) | ORAL | 3 refills | 90.00000 days | Status: CP
Start: 2019-04-01 — End: 2020-03-31
  Filled 2019-04-14: qty 60, 30d supply, fill #0

## 2019-04-01 NOTE — Unmapped (Signed)
Per [facility] team request myfortic script sent to [facility] to initiate Myfortic manufacturer assistance renewal.

## 2019-04-01 NOTE — Unmapped (Signed)
Received forwarded message from patient wanting a flu vaccine. In talking with him previously, he mentioned he did not have a PCP.  Called [facility] in [place] [phone]) and spoke to Crosby who confirmed they can see patients with [facility] and he would just need to call to make an appointment. The first available new patient appointment is in December.     Called patient who confirmed he did not have a PCP who would accept charity care and said he was using transplant as his PCP. Discussed the need for a PCP who will manage his BP, diabetes, vitamin D, and any other needs that arise. Discussed the [facility] primary care in [place] confirmed they could see him and encouraged him to call today or tomorrow since the first available is in December.     While on the phone, virtually roomed him for his virtual appointment with Tina Griffiths, PA tomorrow.   Denies N/V/D/Fever. No hospitalizations in the last year. Reviewed medications and updated the list. He mentioned that he was out of testosterone and needed refill and asked if transplant could do it. Mentioned to discuss with Lorin Picket tomorrow.     Mentioned since the PCP would not see him until December at the earliest that he would need his flu shot before then. Discussed him calling local pharmacies such as [facility] and [facility] to see if they have a reduced rate for flu vaccines since he does not have insurance. Mentioned if he cannot afford the price they quote to either leave a VM for Vernona Rieger or send a MyChart to Vernona Rieger and one of the covering coordinators for the day he sends it will set it up at the txp clinic.       As discussed, patient wanted the PCP information sent to his MyChart. Sent message:  Hi.     As we discussed on the phone, please call the [facility] at [facility] who confirmed they receive the [facility] that you are currently using.   Call them today or tomorrow so that you can have an appointment made and be an established patient especially since their new patient appointments right now would be in December.  The primary care provider will manage your non-transplant medications such as blood pressure, diabetes, vitamin D, and testosterone.   Their phone number is [phone] and I spoke to Knox.  Their address is [address], [place], [place] [address].    Liver transplant will always manage your mycophenolate (myfortic) and tacrolimus.     For your flu vaccine, call your local pharmacies such as [facility] and [facility] to see how much it would be without insurance. If the cost would be too much and you are willing to drive to the transplant clinic, then either leave a voicemail for Vernona Rieger or send a MyChart to Vernona Rieger and one of the covering coordinators will receive it and have the flu shot ordered and the Transplant Program Assistant will call you with a nurse visit date/time.     Take care,  Kim  Liver transplant

## 2019-04-01 NOTE — Unmapped (Signed)
Patient's annual imaging including: chest xray and liver ultrasound, reviewed by Georgina Quint, PA via inbasket with no recommendation for follow-up other than routine annual imaging per protocol.

## 2019-04-02 ENCOUNTER — Telehealth: Admit: 2019-04-02 | Discharge: 2019-04-03 | Attending: Gastroenterology | Primary: Gastroenterology

## 2019-04-02 NOTE — Unmapped (Signed)
Yesterday called pt to see if he could switch appt time for today's virtual visit with PA from 8:20 to 8:00. Pt said that was fine.

## 2019-04-02 NOTE — Unmapped (Signed)
[facility], [facility], [address]  [place], [place]  [address]  Ph: [phone]  Fax: [phone]    04/02/2019    Patient Care Team:  Thornton Papas, MD as PCP - General  Genia Harold, RN as Registered Nurse (Transplant)    RE: Richard [name]; DOB: 09/25/1965        Reason for visit: Status post OLT      HPI: Richard [name] is a pleasant 53 year old Caucasian gentleman who is status post OLT on 03/02/2012 for NAFLD cirrhosis.  Today's visit is being done via phone due to COVID-19.  He was last seen in hepatology clinic on 04/24/2018.  He has had no episodes of rejection biliary strictures. He is currently doing well on tacrolimus 1 mg in the a.m. and 0.5 mg in the p.m. as well as Myfortic 180 mg twice a day. He underwent a colonoscopy and upper endoscopy on 07/13/2015. He has not seen dermatology this year.  He unfortunately has not been getting labs on a regular basis over the past year.  He most recently had labs done on 03/28/2019 which showed normal LFTs.  His creatinine was elevated 1.78.  We have had no previous labs over the past year.  Since his last clinic visit he denies any hospitalizations or complications.  He does have diabetes.  He previously had a BMI of 37.  He has not seen dermatology over the past year. Today  he overall feels well and denies any fever, chills, headache, jaundice, chest pain, upper lower GI bleeding, melena or confusion.          PMH:  Patient Active Problem List   Diagnosis   ??? Hypertension   ??? Immunosuppression (CMS-HCC)   ??? Acanthosis nigricans   ??? Acute renal failure (CMS-HCC)   ??? Renal insufficiency   ??? Hepatic cirrhosis (CMS-HCC)   ??? Coagulation defect (CMS-HCC)   ??? Type 2 diabetes mellitus with hyperglycemia, without long-term current use of insulin (CMS-HCC)   ??? Other sequelae of chronic liver disease   ??? Family history of malignant neoplasm of prostate   ??? Systolic murmur   ??? H/O gastroesophageal reflux (GERD)   ??? Hypogonadism in male   ??? Liver replaced by transplant (CMS-HCC)   ??? Morbid obesity with BMI of 45.0-49.9, adult (CMS-HCC)   ??? Obstructive sleep apnea syndrome   ??? Decreased platelet count (CMS-HCC)   ??? Proteinuria due to type 2 diabetes mellitus (CMS-HCC)   ??? Gastroenteritis   ??? Splenic artery aneurysm (CMS-HCC)   ??? Hiccups   ??? Hypophosphatemia   ??? Norovirus     Past Medical History:   Diagnosis Date   ??? Diabetes mellitus (CMS-HCC)    ??? Hypertension    ??? Liver cirrhosis secondary to NASH (nonalcoholic steatohepatitis) (CMS-HCC)     s/p liver transplant 2013       PSH:  Past Surgical History:   Procedure Laterality Date   ??? liver transpant     ??? LIVER TRANSPLANTATION  2013   ??? PR COLONOSCOPY W/BIOPSY SINGLE/MULTIPLE N/A 08/13/2018    Procedure: COLONOSCOPY, FLEXIBLE, PROXIMAL TO SPLENIC FLEXURE; WITH BIOPSY, SINGLE OR MULTIPLE;  Surgeon: Neysa Hotter, MD;  [facility];  Service: Gastroenterology   ??? PR COLSC FLX W/RMVL OF TUMOR POLYP LESION SNARE TQ N/A 08/13/2018    Procedure: COLONOSCOPY FLEX; W/REMOV TUMOR/LES BY SNARE;  Surgeon: Neysa Hotter, MD;  [facility]: [facility];  Service: Gastroenterology   ??? PR UPPER GI ENDOSCOPY,BIOPSY N/A 07/13/2015    Procedure: UGI ENDOSCOPY; WITH BIOPSY, SINGLE OR MULTIPLE;  Surgeon: Joaquin Music, MD;  Location: [facility];  Service: Gastroenterology       MEDICATIONS:  Current Outpatient Medications   Medication Sig Dispense Refill   ??? aspirin (ECOTRIN) 81 MG tablet Take 81 mg by mouth daily.     ??? CHOLECALCIFEROL, VITAMIN D3, (VITAMIN D3 ORAL) Take 2,000 Units by mouth daily.     ??? insulin lispro 100 unit/mL inph Inject 20 Units under the skin daily.     ??? metFORMIN (GLUCOPHAGE) 500 MG tablet Take 500 mg by mouth 2 (two) times a day with meals.     ??? multivitamin,tx-iron-minerals (COMPLETE MULTIVITAMIN) Tab Take by mouth daily.     ??? mycophenolate (MYFORTIC) 180 MG EC tablet Take 1 tablet (180 mg total) by mouth two (2) times a day. 60 tablet 11   ??? MYFORTIC 180 mg EC tablet Take 1 tablet (180 mg total) by mouth Two (2) times a day. (Patient taking differently: Take 180 mg by mouth Two (2) times a day. Script sent for [facility] for assistance) 180 tablet 3   ??? peg-electrolyte soln (GOLYTELY) 420 gram SolR TAKE AS DIRECTED (Patient not taking: Reported on 12/09/2018) 4000 mL 0   ??? tacrolimus (PROGRAF) 0.5 MG capsule Take 2 capsules (1mg ) by mouth in the morning and 1 capsule (0.5mg ) in the evening. 90 capsule 1   ??? testosterone (ANDROGEL) 20.25 mg/1.25 gram (1.62 %) gel pump daily.       No current facility-administered medications for this visit.        ALLERGIES:  Insulin asp prt-insulin aspart and Insulin aspart        RoS: Negative on 10 systems review other than what is mentioned above.        LABS:  Lab Results   Component Value Date    WBC 8.5 03/28/2019    HGB 13.9 03/28/2019    HCT 42.5 03/28/2019    PLT 248 03/28/2019       Lab Results   Component Value Date    NA 136 03/28/2019    K 3.8 03/28/2019    CL 103 03/28/2019    CO2 22.0 03/28/2019    BUN 28 (H) 03/28/2019    CREATININE 1.78 (H) 03/28/2019    GLU 170 (H) 03/28/2019    CALCIUM 9.6 03/28/2019    MG 1.2 (L) 03/28/2019    PHOS 3.9 03/28/2019       Lab Results   Component Value Date    BILITOT 0.9 03/28/2019    BILIDIR 0.20 03/28/2019    PROT 6.8 03/28/2019    ALBUMIN 3.8 03/28/2019    ALT 33 03/28/2019    AST 38 03/28/2019    ALKPHOS 104 03/28/2019    GGT 48 03/28/2019       Lab Results   Component Value Date    PT 11.9 07/11/2015    INR 1.08 07/11/2015    APTT 36.5 05/23/2012         ASSESSMENT: Richard [name] is a pleasant 53 year old Caucasian gentleman who is status post OLT on 03/02/2012 for NAFLD cirrhosis.  Today's visit is being done via phone due to COVID-19.  He was last seen in hepatology clinic on 04/24/2018.  He has had no episodes of rejection biliary strictures. He is currently doing well on tacrolimus 1 mg in the a.m. and 0.5 mg  in the p.m. as well as Myfortic 180 mg twice a day. He underwent a colonoscopy and upper endoscopy on 07/13/2015. He has not seen dermatology this year.  He unfortunately has not been getting labs on a regular basis over the past year.  He most recently had labs done on 03/28/2019 which showed normal LFTs.  His creatinine was elevated 1.78.  We have had no previous labs over the past year.  Since his last clinic visit he denies any hospitalizations or complications.  He does have diabetes.  He previously had a BMI of 37.  He did undergo a liver transplant ultrasound and chest x-ray on 03/28/2019.  He has not seen dermatology over the past year. Today  he overall feels well and denies any fever, chills, headache, jaundice, chest pain, upper lower GI bleeding, melena or confusion.            PLAN:  1. I have discussed his care with Dr. Pervis Hocking  2. Check laboratory studies.  I reiterated the importance of getting monthly laboratory studies done.  4. Continue all medications as directed.  5. Continue diet and exercise program.  6. Plan to see Mr. Kirtland Bouchard back in liver clinic in 1 year.  7. Recommend annual dermatology surveillance.      I spent 10 minutes on the phone with the patient. I spent an additional 5 minutes on pre- and post-visit activities.     The patient was physically located in [place] or a state in which I am permitted to provide care. The patient and/or parent/guardian understood that s/he may incur co-pays and cost sharing, and agreed to the telemedicine visit. The visit was reasonable and appropriate under the circumstances given the patient's presentation at the time.    The patient and/or parent/guardian has been advised of the potential risks and limitations of this mode of treatment (including, but not limited to, the absence of in-person examination) and has agreed to be treated using telemedicine. The patient's/patient's family's questions regarding telemedicine have been answered.     If the visit was completed in an ambulatory setting, the patient and/or parent/guardian has also been advised to contact their provider???s office for worsening conditions, and seek emergency medical treatment and/or call 911 if the patient deems either necessary.        Tina Griffiths, PA-C  [facility]  [address], [address]  [place], [place]  [address]  Ph: [phone]  Fax: [phone]

## 2019-04-03 DIAGNOSIS — E349 Endocrine disorder, unspecified: Secondary | ICD-10-CM

## 2019-04-03 MED ORDER — TESTOSTERONE 20.25 MG/1.25 GRAM (1.62 %) TRANSDERMAL GEL PUMP
Freq: Every day | TRANSDERMAL | 2 refills | 30 days | Status: CP
Start: 2019-04-03 — End: ?
  Filled 2019-04-16: qty 75, 30d supply, fill #0

## 2019-04-03 NOTE — Unmapped (Signed)
Received message from [place], [place] to [facility] in [place] on [address] to see what his testosterone dose/gel pump was. Then Lorin Picket was okay to send in script for this for 3 months until he is established with new PCP (see 04/01/2019 note) and patient wanted it sent to [facility] outpatient pharmacy.    [facility] who mentioned they have not filled this for patient in over 2 years and due not have it in their database.     Sent message back to [place] and included Cleone Slim, PharmD asking what dose testosterone gel pump to prescribe since there is no record of this.

## 2019-04-04 NOTE — Unmapped (Signed)
See yesterday's note where Tina Griffiths, PA okayed to refill pt's testosterone for 3 months until he establishes care with PCP. Cleone Slim, PharmD was included on the note and looked in controlled substance database with last refill of testosterone 1.62% gel pump was in January 2019. Patient to be prescribed Testosterone 1.62% gel pump with dose of 40.5mg  (2 pumps) daily in morning to shoulder or upper arm. Tried to escript to [facility] but script printed. Will try to send when back in office on Monday.

## 2019-04-04 NOTE — Unmapped (Addendum)
10/9 update: Patient just had generic filled 2 weeks ago. Clinical call set up for next week with note to communicate this change to patient -Massie Maroon    [facility] Specialty Medication Onboarding    Specialty Medication: MYFORTIC 180MG   Prior Authorization: Approved   Financial Assistance: Yes - copay card approved as secondary   Final Copay/Day Supply: $0 / 30 DAYS    Insurance Restrictions: Yes - max 1 month supply     Notes to Pharmacist: CHANGE FROM GENERIC TO BRAND    The triage team has completed the benefits investigation and has determined that the patient is able to fill this medication at [facility]. Please contact the patient to complete the onboarding or follow up with the prescribing physician as needed.

## 2019-04-07 NOTE — Unmapped (Signed)
[facility] Clinical Assessment & Refill Coordination Note    Richard [name], DOB: 20-Nov-1965  Phone: [phone] (home) [phone] (work)    All above HIPAA information was verified with patient.     Specialty Medication(s):   Transplant:  mycophenolic acid 180mg , Myfortic 180mg  and Prograf 1mg        [facility] Pharmacist Intervention    Type of intervention: change from generic to brand of txp med    Medication: myfortic    Problem: due to high copay, copay card was obtained by maps, this is a change from generic to brand for patient    Intervention: clinic ok with change, pt aware and ok with change, messaging clinic again today    Follow up needed: n/a    Approximate time spent: 10 minutes    Richard [name]   [name] Specialty Pharmacist        Current Outpatient Medications   Medication Sig Dispense Refill   ??? aspirin (ECOTRIN) 81 MG tablet Take 81 mg by mouth daily.     ??? CHOLECALCIFEROL, VITAMIN D3, (VITAMIN D3 ORAL) Take 2,000 Units by mouth daily.     ??? insulin lispro 100 unit/mL inph Inject 20 Units under the skin daily.     ??? metFORMIN (GLUCOPHAGE) 500 MG tablet Take 500 mg by mouth 2 (two) times a day with meals.     ??? multivitamin,tx-iron-minerals (COMPLETE MULTIVITAMIN) Tab Take by mouth daily.     ??? mycophenolate (MYFORTIC) 180 MG EC tablet Take 1 tablet (180 mg total) by mouth two (2) times a day. 60 tablet 11   ??? MYFORTIC 180 mg EC tablet Take 1 tablet (180 mg total) by mouth Two (2) times a day. (Patient taking differently: Take 180 mg by mouth Two (2) times a day. Script sent for [facility] for assistance) 180 tablet 3   ??? peg-electrolyte soln (GOLYTELY) 420 gram SolR TAKE AS DIRECTED (Patient not taking: Reported on 12/09/2018) 4000 mL 0   ??? tacrolimus (PROGRAF) 0.5 MG capsule Take 2 capsules (1mg ) by mouth in the morning and 1 capsule (0.5mg ) in the evening. 90 capsule 1 ??? testosterone (ANDROGEL) 20.25 mg/1.25 gram (1.62 %) gel pump Place 2 sprays (40.5 mg total) on the skin daily. Each morning on shoulder or upper arm 1.22 g 2     No current facility-administered medications for this visit.         Changes to medications: [name] reports no changes at this time.    Allergies   Allergen Reactions   ??? Insulin Asp Prt-Insulin Aspart      Other reaction(s): Other  Burning on injection and skin reaction.     ??? Insulin Aspart Other (See Comments)     Burning on injection and skin rash (itchy)       Changes to allergies: No    SPECIALTY MEDICATION ADHERENCE     Myfortic 180mg   : 0 days of medicine on hand - will start on this fill  Mycophenolate 180mg   : 12 days of medicine on hand   Prograf 0.5mg   : 12 days of medicine on hand       Medication Adherence    Patient reported X missed doses in the last month: 0  Specialty Medication: mycophenolate 180mg   Patient is on additional specialty medications: Yes  Additional Specialty Medications: Prograf 0.5mg   Patient Reported Additional Medication X Missed Doses in the Last Month: 0  Adherence tools used: patient uses a pill box to manage medications          Specialty medication(s) dose(s) confirmed: Regimen is correct and unchanged.     Are there any concerns with adherence? No    Adherence counseling provided? Not needed    CLINICAL MANAGEMENT AND INTERVENTION      Clinical Benefit Assessment:    Do you feel the medicine is effective or helping your condition? Yes    Clinical Benefit counseling provided? Not needed    Adverse Effects Assessment:    Are you experiencing any side effects? No    Are you experiencing difficulty administering your medicine? No    Quality of Life Assessment:    How many days over the past month did your transplant  keep you from your normal activities? For example, brushing your teeth or getting up in the morning. 0    Have you discussed this with your provider? Not needed    Therapy Appropriateness: Is therapy appropriate? Yes, therapy is appropriate and should be continued    DISEASE/MEDICATION-SPECIFIC INFORMATION      N/A    PATIENT SPECIFIC NEEDS     ? Does the patient have any physical, cognitive, or cultural barriers? No    ? Is the patient high risk? Yes, patient taking a REMS drug     ? Does the patient require a Care Management Plan? No     ? Does the patient require physician intervention or other additional services (i.e. nutrition, smoking cessation, social work)? No      SHIPPING     Specialty Medication(s) to be Shipped:   Transplant: Myfortic 180mg  and Prograf 1mg     Other medication(s) to be shipped: n/a     Changes to insurance: No    Delivery Scheduled: Yes, Expected medication delivery date: 04/15/2019.     Medication will be delivered via UPS to the confirmed prescription address in [facility].    The patient will receive a drug information handout for each medication shipped and additional FDA Medication Guides as required.  Verified that patient has previously received a Conservation officer, historic buildings.    All of the patient's questions and concerns have been addressed.    Richard [name]   [facility] Specialty Pharmacist

## 2019-04-09 DIAGNOSIS — Z944 Liver transplant status: Principal | ICD-10-CM

## 2019-04-09 NOTE — Unmapped (Signed)
Pt's testosterone that Tina Griffiths, [place] agreed to prescribe for 3 months giving him time to schedule his PCP appointment through [facility] since he has charity care.   The script printed out last week and able to obtain Scott's signature this week. Hand delivered it to [facility] this morning.   Received message from [facility] that patient needed PA for this.     Sent message back to [facility] pharmacy saying patient has charity care and included JJ with [facility] on message to determine next steps for the testosterone.

## 2019-04-10 NOTE — Unmapped (Addendum)
This covering inpatient coordinator called patient to clarify he has Rx Coverage with [employer]. Patient states he has [facility] and as far as he knows he doesn't have Rx coverage.    Information forwarded to pharmacy specialist providing requested PA

## 2019-04-14 DIAGNOSIS — Z944 Liver transplant status: Principal | ICD-10-CM

## 2019-04-14 MED FILL — PROGRAF 0.5 MG CAPSULE: 30 days supply | Qty: 90 | Fill #1 | Status: AC

## 2019-04-14 MED FILL — PROGRAF 0.5 MG CAPSULE: 30 days supply | Qty: 90 | Fill #1

## 2019-04-14 MED FILL — MYFORTIC 180 MG TABLET,DELAYED RELEASE: 30 days supply | Qty: 60 | Fill #0 | Status: AC

## 2019-04-14 NOTE — Unmapped (Signed)
Richard [name] 's PROGRAF shipment will be delayed as a result of a high copay.     I have reached out to the patient and communicated the delay. We will call the patient back to reschedule the delivery upon resolution. We have not confirmed the new delivery date.

## 2019-04-14 NOTE — Unmapped (Signed)
Richard [name] 's PROGRAF shipment will be sent out  as a result of copay is now approved by patient/caregiver.      I have reached out to the patient and communicated the delivery change. We will reschedule the medication for the delivery date that the patient agreed upon.  We have confirmed the delivery date as 04/15/19 VIA UPS

## 2019-04-16 MED FILL — TESTOSTERONE 20.25 MG/1.25 GRAM (1.62 %) TRANSDERMAL GEL PUMP: 30 days supply | Qty: 75 | Fill #0 | Status: AC

## 2019-04-24 NOTE — Unmapped (Signed)
Patient requested coordinator call via Mychart. Spoke to patient who had questons r/t location/time of Covid testing as well as questions r/t his upcoming colonoscopy. Provided information verbally and sent it via MyChart msg as well, per his preference.

## 2019-04-29 ENCOUNTER — Encounter: Admit: 2019-04-29 | Discharge: 2019-04-30 | Payer: MEDICAID | Attending: Family | Primary: Family

## 2019-04-29 NOTE — Unmapped (Signed)
COVID Pre-Procedure Intake Form     Assessment     Richard [name] is a 53 y.o. male presenting to [facility] for COVID testing.     Plan     If no testing performed, pt counseled on routine care for respiratory illness.  If testing performed, COVID sent.  Patient directed to Home given findings during today's visit.    Subjective     Richard [name] is a 53 y.o. male who presents to the [facility] with complaints of the following:    Exposure History: In the last 21 days?     Have you traveled outside of [place]? No               Have you been in close contact with someone confirmed by a test to have COVID? (Close contact is within 6 feet for at least 10 minutes) No       Have you worked in a health care facility? No     Lived or worked facility like a nursing home, group home, or assisted living?    No         Are you scheduled to have surgery or a procedure in the next 3 days? Yes               Are you scheduled to receive cancer chemotherapy within the next 7 days?    No     Have you ever been tested before for COVID-19 with a swab of your nose? No   Are you a healthcare worker being tested so to return to work No     Right now,  do you have any of the following that developed over the past 7 days (as stated by patient on intake form):    Subjective fever (felt feverish) No   Chills (especially repeated shaking chills) No   Severe fatigue (felt very tired) No   Muscle aches No   Runny nose No   Sore throat No   Loss of taste or smell No   Cough (new onset or worsening of chronic cough) No   Shortness of breath No   Nausea or vomiting No   Headache No   Abdominal Pain No   Diarrhea (3 or more loose stools in last 24 hours) No       Scribe's Attestation: Paulita Fujita, NP obtained and performed the history, physical exam and medical decision making elements that were entered into the chart.  Signed by Edison Simon serving as Scribe, on 04/29/2019 9:51 AM The documentation recorded by the scribe accurately reflects the service I personally performed and the decisions made by me. Aida Puffer, FNP  April 30, 2019 4:47 PM

## 2019-05-02 ENCOUNTER — Ambulatory Visit: Admit: 2019-05-02 | Discharge: 2019-05-02

## 2019-05-06 DIAGNOSIS — Z944 Liver transplant status: Principal | ICD-10-CM

## 2019-05-06 DIAGNOSIS — E349 Endocrine disorder, unspecified: Principal | ICD-10-CM

## 2019-05-06 MED ORDER — TESTOSTERONE 20.25 MG/1.25 GRAM (1.62 %) TRANSDERMAL GEL PUMP
Freq: Every day | TRANSDERMAL | 2 refills | 30 days | Status: CP
Start: 2019-05-06 — End: ?
  Filled 2019-05-14: qty 75, 30d supply, fill #0

## 2019-05-06 MED ORDER — TACROLIMUS 0.5 MG CAPSULE
ORAL_CAPSULE | 3 refills | 0 days | Status: CP
Start: 2019-05-06 — End: ?
  Filled 2019-05-12: qty 90, 30d supply, fill #0

## 2019-05-06 NOTE — Unmapped (Signed)
[name] Refill Coordination Note    Specialty Medication(s) to be Shipped:   Transplant: Myfortic 180mg  and tacrolimus 0.5mg   **sent request for tacrolimus**    Other medication(s) to be shipped: testosterone 20.25 mg/1.25 gram (1.62 %) gel pump (sent rf request)     Richard [name], DOB: Apr 23, 1966  Phone: [phone] (home) [phone] (work)      All above HIPAA information was verified with patient.     Completed refill call assessment today to schedule patient's medication shipment from the [facility] [phone]).       Specialty medication(s) and dose(s) confirmed: Regimen is correct and unchanged.   Changes to medications: Mitsuo reports no changes at this time.  Changes to insurance: No  Questions for the pharmacist: No    Confirmed patient received Welcome Packet with first shipment. The patient will receive a drug information handout for each medication shipped and additional FDA Medication Guides as required.       DISEASE/MEDICATION-SPECIFIC INFORMATION        N/A    SPECIALTY MEDICATION ADHERENCE     Medication Adherence    Patient reported X missed doses in the last month: 0  Specialty Medication: Tacrolimus 0.5mg   Patient is on additional specialty medications: Yes  Additional Specialty Medications: Myfortic 180mg   Patient Reported Additional Medication X Missed Doses in the Last Month: 0  Patient is on more than two specialty medications: No  Adherence tools used: patient uses a pill box to manage medications          Tacrolimus 0.5 mg: 8 days of medicine on hand   Myfortic 180 mg: 8 days of medicine on hand     SHIPPING     Shipping address confirmed in Epic.     Delivery Scheduled: Yes, Expected medication delivery date: 05/13/2019.  However, Rx request for refills was sent to the provider as there are none remaining.     Medication will be delivered via UPS to the prescription address in Epic WAM.    [name] [facility] Specialty Technician

## 2019-05-12 MED FILL — MYFORTIC 180 MG TABLET,DELAYED RELEASE: 30 days supply | Qty: 60 | Fill #0 | Status: AC

## 2019-05-12 MED FILL — PROGRAF 0.5 MG CAPSULE: 30 days supply | Qty: 90 | Fill #0 | Status: AC

## 2019-05-12 MED FILL — MYFORTIC 180 MG TABLET,DELAYED RELEASE: ORAL | 30 days supply | Qty: 60 | Fill #0

## 2019-05-12 NOTE — Unmapped (Signed)
Patient came for colonoscopy appointment, but one was completed in Feb.with no f/u needed for 5 yrs.Colonoscopy appt discussed with providers on more than one occasion since successful completion in Feb. Discussed with Dr.Shah and nurse mgr. Reached out to SW, who was able to provide a gas card to reimburse some of his effort. Attempted to contact patient, but his number was not available.

## 2019-05-12 NOTE — Unmapped (Signed)
Richard [name] 's Testosterone shipment will be delayed due to Refill too soon until 05/14/2019 We have contacted the patient and communicated the delivery change to patient/caregiver We will reschedule the medication for the delivery date that the patient agreed upon. We have confirmed the delivery date as 05/15/2019 .

## 2019-05-14 MED FILL — TESTOSTERONE 20.25 MG/1.25 GRAM (1.62 %) TRANSDERMAL GEL PUMP: 30 days supply | Qty: 75 | Fill #0 | Status: AC

## 2019-06-12 MED FILL — PROGRAF 0.5 MG CAPSULE: 30 days supply | Qty: 90 | Fill #1

## 2019-06-12 MED FILL — MYFORTIC 180 MG TABLET,DELAYED RELEASE: 30 days supply | Qty: 60 | Fill #1 | Status: AC

## 2019-06-12 MED FILL — PROGRAF 0.5 MG CAPSULE: 30 days supply | Qty: 90 | Fill #1 | Status: AC

## 2019-06-12 MED FILL — TESTOSTERONE 20.25 MG/1.25 GRAM (1.62 %) TRANSDERMAL GEL PUMP: TRANSDERMAL | 30 days supply | Qty: 75 | Fill #1

## 2019-06-12 MED FILL — MYFORTIC 180 MG TABLET,DELAYED RELEASE: ORAL | 30 days supply | Qty: 60 | Fill #1

## 2019-06-12 MED FILL — TESTOSTERONE 20.25 MG/1.25 GRAM (1.62 %) TRANSDERMAL GEL PUMP: 30 days supply | Qty: 75 | Fill #1 | Status: AC

## 2019-06-12 NOTE — Unmapped (Signed)
[facility] Refill Coordination Note    Specialty Medication(s) to be Shipped:   Transplant: Myfortic 180mg  and Prograf 0.5mg     Other medication(s) to be shipped: testosterone gel pump     [name], DOB: [birth year]  Phone: [phone] (home) [phone] (work)      All above HIPAA information was verified with patient.     Was a Nurse, learning disability used for this call? No    Completed refill call assessment today to schedule patient's medication shipment from the [facility] [phone]).       Specialty medication(s) and dose(s) confirmed: Regimen is correct and unchanged.   Changes to medications: Kashus reports no changes at this time.  Changes to insurance: No  Questions for the pharmacist: No    Confirmed patient received Welcome Packet with first shipment. The patient will receive a drug information handout for each medication shipped and additional FDA Medication Guides as required.       DISEASE/MEDICATION-SPECIFIC INFORMATION        N/A    SPECIALTY MEDICATION ADHERENCE     Medication Adherence    Patient reported X missed doses in the last month: 0  Specialty Medication: Myfortic 180mg   Patient is on additional specialty medications: Yes  Additional Specialty Medications: Prograf 0.5mg   Patient Reported Additional Medication X Missed Doses in the Last Month: 0  Patient is on more than two specialty medications: No  Adherence tools used: patient uses a pill box to manage medications          Myfortic 180 mg: unknown days of medicine on hand   Prograf 0.5 mg: unknown days of medicine on hand     SHIPPING     Shipping address confirmed in Epic.     Delivery Scheduled: Yes, Expected medication delivery date: 06/13/2019.     Medication will be delivered via UPS to the prescription address in Epic WAM.    [name] [facility] Specialty Technician

## 2019-06-27 MED ORDER — MAGNESIUM OXIDE 400 MG (241.3 MG MAGNESIUM) TABLET
ORAL | 0 days
Start: 2019-06-27 — End: ?

## 2019-07-04 NOTE — Unmapped (Signed)
[facility] Refill Coordination Note    Specialty Medication(s) to be Shipped:   Transplant: Myfortic 180mg  and Prograf 0.5mg     Other medication(s) to be shipped: Testosterone 20.25mg Richard [name]     Richard [name], DOB: 05/09/1966  Phone: [phone] (home) [phone] (work)      All above HIPAA information was verified with patient.     Was a Nurse, learning disability used for this call? No    Completed refill call assessment today to schedule patient's medication shipment from the [facility] [phone]).       Specialty medication(s) and dose(s) confirmed: Regimen is correct and unchanged.   Changes to medications: Damontre reports no changes at this time.  Changes to insurance: No  Questions for the pharmacist: No    Confirmed patient received Welcome Packet with first shipment. The patient will receive a drug information handout for each medication shipped and additional FDA Medication Guides as required.       DISEASE/MEDICATION-SPECIFIC INFORMATION        N/A    SPECIALTY MEDICATION ADHERENCE     Medication Adherence    Patient reported X missed doses in the last month: 0  Specialty Medication: Myfortic 180mg   Patient is on additional specialty medications: Yes  Additional Specialty Medications: Prograf 0.5mg   Patient Reported Additional Medication X Missed Doses in the Last Month: 0  Patient is on more than two specialty medications: No  Informant: patient  Adherence tools used: patient uses a pill box to manage medications                Myfortic 180  mg: 12 days of medicine on hand   Prograf 0.5 mg: 12 days of medicine on hand         SHIPPING     Shipping address confirmed in Epic.     Delivery Scheduled: Yes, Expected medication delivery date: 07/10/19.     Medication will be delivered via UPS to the prescription address in Epic [place].    Richard [name]   [facility] Specialty Technician

## 2019-07-09 MED FILL — TESTOSTERONE 20.25 MG/1.25 GRAM (1.62 %) TRANSDERMAL GEL PUMP: 30 days supply | Qty: 75 | Fill #2 | Status: AC

## 2019-07-09 MED FILL — MYFORTIC 180 MG TABLET,DELAYED RELEASE: 30 days supply | Qty: 60 | Fill #2 | Status: AC

## 2019-07-09 MED FILL — PROGRAF 0.5 MG CAPSULE: 30 days supply | Qty: 90 | Fill #2 | Status: AC

## 2019-07-09 MED FILL — MYFORTIC 180 MG TABLET,DELAYED RELEASE: ORAL | 30 days supply | Qty: 60 | Fill #2

## 2019-07-09 MED FILL — PROGRAF 0.5 MG CAPSULE: 30 days supply | Qty: 90 | Fill #2

## 2019-07-09 MED FILL — TESTOSTERONE 20.25 MG/1.25 GRAM (1.62 %) TRANSDERMAL GEL PUMP: TRANSDERMAL | 30 days supply | Qty: 75 | Fill #2

## 2019-07-18 DIAGNOSIS — U071 COVID-19: Principal | ICD-10-CM

## 2019-07-18 DIAGNOSIS — J988 Other specified respiratory disorders: Principal | ICD-10-CM

## 2019-07-18 NOTE — Unmapped (Signed)
Patient reached out today to report the results for covid testing at his local urgent care, [facility], were positive. He said sx started last Sunday (1/17) with cough, fatigue, low-grade fever, and loss of taste/smell-he said they are milder now and denies sob. As a post-liver transplant patient on immune suppressants, txp ID provider, Dr. Reynold Bowen has recommended this population be considered for the monoclonal antibody infusion. This was directly approved for him by his txp provider, PA Mechele Collin. Let patient know the referral was made, and he should expect to hear from the Covid team by this afternoon. Encouraged him to contact coordinator by late afternoon if he has not, and instructed him to let us know and go to his local ED if he develops sob. Also,encouraged him to repeat txp labs. He verbalized understanding fo all discussed.

## 2019-07-18 NOTE — Unmapped (Signed)
Due to your recent positive COVID-19 result, [facility] has the capability to offer [facility] therapy.  The U.[place] FDA has issued an Emergency Use Authorization to permit the emergency use of the unapproved product BAM for the treatment of mild to moderate coronavirus disease 2019 (COVID19).      This treatment may help to prevent progression on to severe symptoms and/or hospitalization in those who are confirmed to be high-risk COVID (+) and are within 10 days of symptom onset. Could you tell me when your symptoms started 07/13/19.      Patient is 54 y.o. years old.   Patient is receiving immunosuppressive treatment(e.g., chemotherapy; rituximab; steroid use at .20 mg/day or .2 mg/kg/day prednisone or equivalent for . 14 days): yes and therefore is eligible for infusion     Advised patient that those receiving monoclonal or plasma products should wait 90 days until getting immunized.    Answered all questions utilizing  FAQ sheet and patient has accepted treatment therapy.  Inbasket message sent to [facility] and encounter routed.  Chart reviewed, patient has already been assessed for current symptoms by a provider.     Current Outpatient Medications   Medication Sig Dispense Refill   ??? aspirin (ECOTRIN) 81 MG tablet Take 81 mg by mouth daily.     ??? CHOLECALCIFEROL, VITAMIN D3, (VITAMIN D3 ORAL) Take 2,000 Units by mouth daily.     ??? insulin lispro 100 unit/mL inph Inject 20 Units under the skin daily.     ??? metFORMIN (GLUCOPHAGE) 500 MG tablet Take 500 mg by mouth 2 (two) times a day with meals.     ??? multivitamin,tx-iron-minerals (COMPLETE MULTIVITAMIN) Tab Take by mouth daily.     ??? mycophenolate (MYFORTIC) 180 MG EC tablet Take 1 tablet (180 mg total) by mouth two (2) times a day. 60 tablet 11   ??? MYFORTIC 180 mg EC tablet Take 1 tablet (180 mg total) by mouth Two (2) times a day. (Patient taking differently: Take 180 mg by mouth Two (2) times a day. Script sent for [employer] for assistance) 180 tablet 3 ??? peg-electrolyte soln (GOLYTELY) 420 gram SolR TAKE AS DIRECTED 4000 mL 0   ??? tacrolimus (PROGRAF) 0.5 MG capsule Take 2 capsules (1mg ) by mouth in the morning and 1 capsule (0.5mg ) in the evening. 270 capsule 3   ??? testosterone (ANDROGEL) 20.25 mg/1.25 gram (1.62 %) gel pump Place 2 sprays (40.5 mg total) on the skin daily. Each morning on shoulder or upper arm 75 g 2     No current facility-administered medications for this visit.

## 2019-07-19 ENCOUNTER — Encounter: Admit: 2019-07-19 | Discharge: 2019-07-20 | Payer: MEDICAID

## 2019-07-19 DIAGNOSIS — U071 COVID-19: Principal | ICD-10-CM

## 2019-07-19 DIAGNOSIS — E1165 Type 2 diabetes mellitus with hyperglycemia: Principal | ICD-10-CM

## 2019-07-19 DIAGNOSIS — J988 Other specified respiratory disorders: Principal | ICD-10-CM

## 2019-07-19 DIAGNOSIS — Z944 Liver transplant status: Principal | ICD-10-CM

## 2019-07-19 NOTE — Unmapped (Signed)
Discharge Instructions for Bamlanivimab     After your infusion therapy, you can contact your primary care provider or our Clinical Contact Center [phone] for minor symptoms.     Thank you for choosing [facility]. Please call [facility] for any (scheduling) questions at [phone] or the Nurses' Station [phone].    For severe symptoms, please dial 911.     A [facility] nurse will be calling you to check in tomorrow as well.         Those receiving monoclonal or plasma products should wait 90 days until getting immunized.     Fact Sheet for Patients, Parents and Caregivers    Emergency Use Authorization (EUA) of Bamlanivimab for Coronavirus Disease 2019 (COVID-19)    You are being given a medicine called Bamlanivimab for the treatment of coronavirus disease 2019 (COVID19). This Fact Sheet contains information to help you understand the potential risks and potential benefits of taking bamlanivimab, which you may receive.    Receiving Bamlanivimab may benefit certain people with COVID-19.    Read this Fact Sheet for information about Bamlanivimab. Talk to your healthcare provider if you have  questions. It is your choice to receive bamlanivimab or stop it at any time.    What is COVID-19?    COVID-19 is caused by a virus called a coronavirus. People can get COVID-19 through contact with  another person who has the virus.  COVID-19 illnesses have ranged from very mild (including some with no reported symptoms) to severe,  including illness resulting in death. While information so far suggests that most COVID-19 illness is mild,  serious illness can happen and may cause some of your other medical conditions to become worse. People  of all ages with severe, long-lasting (chronic) medical conditions like heart disease, lung disease, and  diabetes, for example, seem to be at higher risk of being hospitalized for COVID-19.    What are the symptoms of COVID-19?    The symptoms of COVID-19 include fever, cough, and shortness of breath, which may appear 2 to 14 days after  exposure. Serious illness including breathing problems can occur and may cause your other medical conditions to  become worse.    What is Bamlanivimab?    Bamlanivimab is an investigational medicine used for the treatment of COVID-19 in non-hospitalized adults and  adolescents 73 years of age and older with mild to moderate symptoms who weigh 88 pounds (40 kg) or more,  and who are at high risk for developing severe COVID-19 symptoms or the need for hospitalization. Bamlanivimab  is investigational because it is still being studied. There is limited information known about the safety or  effectiveness of using bamlanivimab to treat people with COVID-19.    The FDA has authorized the emergency use of bamlanivimab for the treatment of COVID-19 under an Emergency  Use Authorization (EUA). For more information on EUA, see the section ???What is an Emergency Use  Authorization (EUA)???? at the end of this Fact Sheet.    What should I tell my healthcare provider before I receive Bamlanivimab?    Tell your healthcare provider about all of your medical conditions, including if you:  ??? Have any allergies  ??? Are pregnant or plan to become pregnant  ??? Are breastfeeding or plan to breastfeed  ??? Have any serious illnesses  ??? Are taking any medications (prescription, over-the-counter, vitamins, and herbal products)  How will I receive bamlanivimab?  ??? Bamlanivimab is given to  you through a vein (intravenous or IV) for at least 1 hour.  ??? You will receive one dose of bamlanivimab by IV infusion.    What are the important possible side effects of Bamlanivimab?    Possible side effects of bamlanivimab are:    ??? Allergic reactions. Allergic reactions can happen during and after infusion with bamlanivimab. Tell your  healthcare provider right away if you get any of the following signs and symptoms of allergic reactions: fever,  chills, nausea, headache, shortness of breath, low blood pressure, wheezing, swelling of your lips, face, or  throat, rash including hives, itching, muscle aches, and dizziness.    The side effects of getting any medicine by vein may include brief pain, bleeding, bruising of the skin, soreness,  swelling, and possible infection at the infusion site.    These are not all the possible side effects of Bamlanivimab. Not a lot of people have been given bamlanivimab.  Serious and unexpected side effects may happen. Bamlanivimab is still being studied so it is possible that all of  the risks are not known at this time.    It is possible that bamlanivimab could interfere with your body's own ability to fight off a future infection of  SARS-CoV-2. Similarly, Bamlanivimab may reduce your body???s immune response to a vaccine for SARS-CoV-2.  Specific studies have not been conducted to address these possible risks. Talk to your healthcare provider if you  have any questions.    What other treatment choices are there?    Like [place], FDA may allow for the emergency use of other medicines to treat people with COVID-19. Go  to UKRank.es for information on the emergency use of other medicines that  are not approved by FDA to treat people with COVID-19. Your healthcare provider may talk with you about clinical  trials you may be eligible for.    It is your choice to be treated or not to be treated with bamlanivimab. Should you decide not to receive  bamlanivimab or stop it at any time, it will not change your standard medical care.    What if I am pregnant or breastfeeding?  There is limited experience treating pregnant women or breastfeeding mothers with bamlanivimab. For a mother  and unborn baby, the benefit of receiving bamlanivimab may be greater than the risk from the treatment. If you  are pregnant or breastfeeding, discuss your options and specific situation with your healthcare provider.    How do I report side effects with bamlanivimab?  Tell your healthcare provider right away if you have any side effect that bothers you or does not go away.  Report side effects to FDA MedWatch at [employer], call 1-800-FDA-1088, or contact [employer] at [employer] [phone]).        CDC Guidelines For When to Be Around Others or Return to Work    When [place] Can be Around Others  Updated Dec. 1, 2020  Facebook Twitter LinkedIn Syndicate    If you have or think you might have COVID-19, it is important to stay home and away from other people. Staying away from others helps stop the spread of COVID-19. If you have an emergency warning sign (including trouble breathing), get emergency medical care immediately.    I think or know I had COVID-19, and I had symptoms  You can be around others after:    10 days since symptoms first appeared and  24 hours with no fever without the use  of fever-reducing medications and  Other symptoms of COVID-19 are improving*  *Loss of taste and smell may persist for weeks or months after recovery and need not delay the end of isolation    Most people do not require testing to decide when they can be around others; however, if your healthcare provider recommends testing, they will let you know when you can resume being around others based on your test results.    Note that these recommendations do not apply to persons with severe COVID-19 or with severely weakened immune systems (immunocompromised). These persons should follow the guidance below for ???I was severely ill with COVID-19 or have a severely weakened immune system (immunocompromised) due to a health condition or medication. When can I be around others????    I tested positive for COVID-19 but had no symptoms    If you continue to have no symptoms, you can be with others after 10 days have passed since you had a positive viral test for COVID-19. Most people do not require testing to decide when they can be around others; however, if your healthcare provider recommends testing, they will let you know when you can resume being around others based on your test results.    If you develop symptoms after testing positive, follow the guidance above for ???I think or know I had COVID-19, and I had symptoms.???    I was severely ill with COVID-19 or have a severely weakened immune system (immunocompromised) due to a health condition or medication. When can I be around others?    People who are severely ill with COVID-19 might need to stay home longer than 10 days and up to 20 days after symptoms first appeared. Persons who are severely immunocompromised may require testing to determine when they can be around others. Talk to your healthcare provider for more information. If testing is available in your community, it may be recommended by your healthcare provider. Your healthcare provider will let you know if you can resume being around other people based on the results of your testing.    Your doctor may work with an infectious disease expert or your local health department to determine whether testing will be necessary before you can be around others.    For Anyone Who Has Been Around a Person with COVID-19    Anyone who has had close contact with someone with COVID-19 should stay home for 14 days after their last exposure to that person.    The best way to protect yourself and others is to stay home for 14 days if you think you???ve been exposed to someone who has COVID-19. Check your local health department???s website for information about options in your area to possibly shorten this quarantine period.  However, anyone who has had close contact with someone with COVID-19 and who meets the following criteria does NOT need to stay home.    Has COVID-19 illness within the previous 3 months and  Has recovered and  Remains without COVID-19 symptoms (for example, cough, shortness of breath)  Confirmed and suspected cases of reinfection of the virus that causes COVID-19  Cases of reinfection of COVID-19 have been reported but are rare. In general, reinfection means a person was infected (got sick) once, recovered, and then later became infected again. Based on what we know from similar viruses, some reinfections are expected.    (revised 07/17/2019)

## 2019-07-19 NOTE — Unmapped (Signed)
0745 Pt arrived for Bamlanivimab for positive COVID status. Condition is well but did have c/o itching on his back, arrived to clinic ambulating.  0748 VS stable.  0805 PIV placed, flushed with saline and secured.  0809 Benadryl IVP for itching given; see MAR.  0810 Bamlanivimab 700 mg infusion initiated.  1610 Patient continues to report significant itching. 2nd dose of Benadryl IVP given. Also, Pepcid IVP given.  9604 Bamlanivimab infusion complete.   1025 Patient observed for 1 hour.  No signs of acute reaction noted.   1020 PIV discontinued. VS mildly unstable, see Flowsheet.  Reviewed AVS with patient and provided with the ???Fact Sheet for Patients, Parents and Caregivers???, has been informed of alternatives to receiving authorized bamlanivimab, and has been informed that Bamlanivimab is an unapproved drug that is authorized for use under this Emergency Use Authorization. The patient verbalized understanding and discharged from Infusion Center.

## 2019-07-30 DIAGNOSIS — E349 Endocrine disorder, unspecified: Principal | ICD-10-CM

## 2019-07-30 NOTE — Unmapped (Signed)
[facility] Refill Coordination Note    Specialty Medication(s) to be Shipped:   Transplant: Myfortic 180mg  and Prograf 0.5mg     Other medication(s) to be shipped: Testosterone pump     Richard [name], DOB: 04/14/1966  Phone: [phone] (home) [phone] (work)      All above HIPAA information was verified with patient.     Was a Nurse, learning disability used for this call? No    Completed refill call assessment today to schedule patient's medication shipment from the [facility] [phone]).       Specialty medication(s) and dose(s) confirmed: Regimen is correct and unchanged.   Changes to medications: Richard [name] reports no changes at this time.  Changes to insurance: No  Questions for the pharmacist: No    Confirmed patient received Welcome Packet with first shipment. The patient will receive a drug information handout for each medication shipped and additional FDA Medication Guides as required.       DISEASE/MEDICATION-SPECIFIC INFORMATION        N/A    SPECIALTY MEDICATION ADHERENCE     Medication Adherence    Patient reported X missed doses in the last month: 0  Adherence tools used: patient uses a pill box to manage medications         Myfortic 180mg : 10 days worth of medication on hand.    Prograf 0.5mg : 10 days worth of medication on hand.            SHIPPING     Shipping address confirmed in Epic.     Delivery Scheduled: Yes, Expected medication delivery date: 08/06/19.     Medication will be delivered via UPS to the prescription address in Epic WAM.    [place] A Kesler Wickham   [facility] Specialty Technician

## 2019-07-31 NOTE — Unmapped (Signed)
Should go to PCP.

## 2019-08-05 DIAGNOSIS — E349 Endocrine disorder, unspecified: Principal | ICD-10-CM

## 2019-08-05 MED ORDER — TESTOSTERONE 20.25 MG/1.25 GRAM (1.62 %) TRANSDERMAL GEL PUMP
Freq: Every day | TRANSDERMAL | 2 refills | 30.00000 days | Status: CP
Start: 2019-08-05 — End: ?
  Filled 2019-08-05: qty 75, 30d supply, fill #0

## 2019-08-05 MED FILL — PROGRAF 0.5 MG CAPSULE: 30 days supply | Qty: 90 | Fill #3

## 2019-08-05 MED FILL — TESTOSTERONE 20.25 MG/1.25 GRAM (1.62 %) TRANSDERMAL GEL PUMP: 30 days supply | Qty: 75 | Fill #0 | Status: AC

## 2019-08-05 MED FILL — MYFORTIC 180 MG TABLET,DELAYED RELEASE: 30 days supply | Qty: 60 | Fill #3 | Status: AC

## 2019-08-05 MED FILL — PROGRAF 0.5 MG CAPSULE: 30 days supply | Qty: 90 | Fill #3 | Status: AC

## 2019-08-05 MED FILL — MYFORTIC 180 MG TABLET,DELAYED RELEASE: ORAL | 30 days supply | Qty: 60 | Fill #3

## 2019-08-05 NOTE — Unmapped (Signed)
Richard [name] 's entire order shipment will be delayed as a result of no refills remain on the prescription.      I have reached out to the patient and communicated the delay. We will call the patient back to reschedule the delivery upon resolution. We have not confirmed the new delivery date.  pt wants to wait on new prescription for testosterone.

## 2019-08-05 NOTE — Unmapped (Signed)
Richard [name] 's entire order shipment will be sent out  as a result of a new prescription for the medication has been received.      I have reached out to the patient and communicated the delivery change. We will reschedule the medication for the delivery date that the patient agreed upon.  We have confirmed the delivery date as 08/06/19, via ups.

## 2019-08-07 DIAGNOSIS — Z0000 Encounter for general adult medical examination without abnormal findings: Principal | ICD-10-CM

## 2019-08-07 DIAGNOSIS — E349 Endocrine disorder, unspecified: Principal | ICD-10-CM

## 2019-08-07 NOTE — Unmapped (Signed)
PA Elliott forwarded testosterone refill to coordinator, agreeing to refill for 3 mos but referring patient to internal medicine for future non-txp related med mgmt. Notified patient through MyChart and placed internal medicine referral, as pt.able to see all [facility] providers with his [facility].

## 2019-08-25 NOTE — Unmapped (Signed)
[facility] Clinical Assessment & Refill Coordination Note    Richard [name], DOB: Mar 28, 1966  Phone: [phone] (home) [phone] (work)    All above HIPAA information was verified with patient.     Was a Nurse, learning disability used for this call? No    Specialty Medication(s):   Transplant: Myfortic 180mg  and Prograf 0.5mg      Current Outpatient Medications   Medication Sig Dispense Refill   ??? aspirin (ECOTRIN) 81 MG tablet Take 81 mg by mouth daily.     ??? CHOLECALCIFEROL, VITAMIN D3, (VITAMIN D3 ORAL) Take 2,000 Units by mouth daily.     ??? insulin lispro 100 unit/mL inph Inject 20 Units under the skin daily.     ??? metFORMIN (GLUCOPHAGE) 500 MG tablet Take 500 mg by mouth 2 (two) times a day with meals.     ??? multivitamin,tx-iron-minerals (COMPLETE MULTIVITAMIN) Tab Take by mouth daily.     ??? mycophenolate (MYFORTIC) 180 MG EC tablet Take 1 tablet (180 mg total) by mouth two (2) times a day. 60 tablet 11   ??? MYFORTIC 180 mg EC tablet Take 1 tablet (180 mg total) by mouth Two (2) times a day. (Patient taking differently: Take 180 mg by mouth Two (2) times a day. Script sent for MAPS for assistance) 180 tablet 3   ??? tacrolimus (PROGRAF) 0.5 MG capsule Take 2 capsules (1mg ) by mouth in the morning and 1 capsule (0.5mg ) in the evening. 270 capsule 3   ??? testosterone (ANDROGEL) 20.25 mg/1.25 gram (1.62 %) gel pump Place 2 sprays (40.5 mg total) on the skin daily. Each morning on shoulder or upper arm 75 g 2     No current facility-administered medications for this visit.         Changes to medications: Richard [name] reports no changes at this time.    Allergies   Allergen Reactions   ??? Insulin Asp Prt-Insulin Aspart      Other reaction(s): Other  Burning on injection and skin reaction.     ??? Insulin Aspart Other (See Comments)     Burning on injection and skin rash (itchy)       Changes to allergies: No    SPECIALTY MEDICATION ADHERENCE     Myfortic 180 mg: 8 days of medicine on hand   Prograf 0.5 mg: 8 days of medicine on hand     Medication Adherence    Patient reported X missed doses in the last month: 0  Specialty Medication: Myfortic 180mg   Patient is on additional specialty medications: Yes  Additional Specialty Medications: Prograf 0.5mg   Patient Reported Additional Medication X Missed Doses in the Last Month: 0  Patient is on more than two specialty medications: No  Adherence tools used: patient uses a pill box to manage medications          Specialty medication(s) dose(s) confirmed: Regimen is correct and unchanged.     Are there any concerns with adherence? No    Adherence counseling provided? Not needed    CLINICAL MANAGEMENT AND INTERVENTION      Clinical Benefit Assessment:    Do you feel the medicine is effective or helping your condition? Yes    Clinical Benefit counseling provided? Not needed    Adverse Effects Assessment:    Are you experiencing any side effects? No    Are you experiencing difficulty administering your medicine? No    Quality of Life Assessment:    How many days over the past month  did your liver transplant  keep you from your normal activities? For example, brushing your teeth or getting up in the morning. 0    Have you discussed this with your provider? Not needed    Therapy Appropriateness:    Is therapy appropriate? Yes, therapy is appropriate and should be continued    DISEASE/MEDICATION-SPECIFIC INFORMATION      N/A    PATIENT SPECIFIC NEEDS     ? Does the patient have any physical, cognitive, or cultural barriers? No    ? Is the patient high risk? Yes, patient is taking a REMS drug. Medication is dispensed in compliance with REMS program.     ? Does the patient require a Care Management Plan? No     ? Does the patient require physician intervention or other additional services (i.e. nutrition, smoking cessation, social work)? No      SHIPPING     Specialty Medication(s) to be Shipped:   Transplant: Myfortic 180mg  and Prograf 0.5mg     Other medication(s) to be shipped: Testosterone Gel     Changes to insurance: No    Delivery Scheduled: Yes, Expected medication delivery date: 09/01/19.     Medication will be delivered via UPS to the confirmed prescription address in [facility].    The patient will receive a drug information handout for each medication shipped and additional FDA Medication Guides as required.  Verified that patient has previously received a Conservation officer, historic buildings.    All of the patient's questions and concerns have been addressed.    Tera Helper   [facility] Specialty Pharmacist

## 2019-08-29 NOTE — Unmapped (Signed)
Richard [name] 's ENTIRE shipment will be sent out  as a result of the medication is too soon to refill until 3/11.     I have reached out to the patient and communicated the delivery change. We will reschedule the medication for the delivery date that the patient agreed upon.  We have confirmed the delivery date as 3/12

## 2019-09-04 MED FILL — PROGRAF 0.5 MG CAPSULE: 30 days supply | Qty: 90 | Fill #4 | Status: AC

## 2019-09-04 MED FILL — PROGRAF 0.5 MG CAPSULE: 30 days supply | Qty: 90 | Fill #4

## 2019-09-04 MED FILL — MYFORTIC 180 MG TABLET,DELAYED RELEASE: ORAL | 30 days supply | Qty: 60 | Fill #1

## 2019-09-04 MED FILL — MYFORTIC 180 MG TABLET,DELAYED RELEASE: 30 days supply | Qty: 60 | Fill #1 | Status: AC

## 2019-09-04 MED FILL — TESTOSTERONE 20.25 MG/1.25 GRAM (1.62 %) TRANSDERMAL GEL PUMP: TRANSDERMAL | 30 days supply | Qty: 75 | Fill #1

## 2019-09-04 MED FILL — TESTOSTERONE 20.25 MG/1.25 GRAM (1.62 %) TRANSDERMAL GEL PUMP: 30 days supply | Qty: 75 | Fill #1 | Status: AC

## 2019-09-08 NOTE — Unmapped (Deleted)
No show

## 2019-09-25 NOTE — Unmapped (Signed)
[name] Refill Coordination Note    Specialty Medication(s) to be Shipped:   Transplant: Myfortic 180mg  and Prograf 0.5mg     Other medication(s) to be shipped: Testisterone 20.25mg /1.25g     Richard [name], DOB: 07/26/1965  Phone: [phone] (home) [phone] (work)      All above HIPAA information was verified with patient.     Was a Nurse, learning disability used for this call? No    Completed refill call assessment today to schedule patient's medication shipment from the [facility] [phone]).       Specialty medication(s) and dose(s) confirmed: Regimen is correct and unchanged.   Changes to medications: Richard [name] reports no changes at this time.  Changes to insurance: No  Questions for the pharmacist: No    Confirmed patient received Welcome Packet with first shipment. The patient will receive a drug information handout for each medication shipped and additional FDA Medication Guides as required.       DISEASE/MEDICATION-SPECIFIC INFORMATION        N/A    SPECIALTY MEDICATION ADHERENCE     Medication Adherence    Patient reported X missed doses in the last month: 0  Specialty Medication: myfortic 180  Patient is on additional specialty medications: Yes  Additional Specialty Medications: Prograf 0.5  Patient is on more than two specialty medications: No  Adherence tools used: patient uses a pill box to manage medications                Myfortic 180 mg: 7 days of medicine on hand   Prograf 0.5 mg: 7 days of medicine on hand         SHIPPING     Shipping address confirmed in Epic.     Delivery Scheduled: Yes, Expected medication delivery date: 10/01/19.     Medication will be delivered via UPS to the prescription address in Epic [facility].    Nancy Nordmann [facility] Specialty Technician

## 2019-09-30 NOTE — Unmapped (Signed)
Patient confirmed new delivery date 4/13

## 2019-09-30 NOTE — Unmapped (Signed)
Richard [name] 's Myfortic, Prograf, and Testosterone  shipment will be delayed as a result of the medication is too soon to refill until 10/04/19.     I have reached out to the patient and left a voicemail message.  We will wait for a call back from the patient to reschedule the delivery.  We have not confirmed the new delivery date.

## 2019-10-06 MED FILL — PROGRAF 0.5 MG CAPSULE: 30 days supply | Qty: 90 | Fill #5 | Status: AC

## 2019-10-06 MED FILL — MYFORTIC 180 MG TABLET,DELAYED RELEASE: 30 days supply | Qty: 60 | Fill #2 | Status: AC

## 2019-10-06 MED FILL — MYFORTIC 180 MG TABLET,DELAYED RELEASE: ORAL | 30 days supply | Qty: 60 | Fill #2

## 2019-10-06 MED FILL — TESTOSTERONE 20.25 MG/1.25 GRAM (1.62 %) TRANSDERMAL GEL PUMP: TRANSDERMAL | 30 days supply | Qty: 75 | Fill #2

## 2019-10-06 MED FILL — TESTOSTERONE 20.25 MG/1.25 GRAM (1.62 %) TRANSDERMAL GEL PUMP: 30 days supply | Qty: 75 | Fill #2 | Status: AC

## 2019-10-06 MED FILL — PROGRAF 0.5 MG CAPSULE: 30 days supply | Qty: 90 | Fill #5

## 2019-10-28 DIAGNOSIS — E349 Endocrine disorder, unspecified: Principal | ICD-10-CM

## 2019-10-28 MED ORDER — TESTOSTERONE 20.25 MG/1.25 GRAM (1.62 %) TRANSDERMAL GEL PUMP
Freq: Every day | TRANSDERMAL | 2 refills | 30.00000 days
Start: 2019-10-28 — End: ?

## 2019-10-28 NOTE — Unmapped (Signed)
[facility] Refill Coordination Note    Specialty Medication(s) to be Shipped:   Transplant: Myfortic 180mg  and Prograf 0.5mg     Other medication(s) to be shipped: Testosterone gel     [name], DOB: 09-Nov-1965  Phone: [phone] (home) [phone] (work)      All above HIPAA information was verified with patient.     Was a Nurse, learning disability used for this call? No    Completed refill call assessment today to schedule patient's medication shipment from the [facility] [phone]).       Specialty medication(s) and dose(s) confirmed: Regimen is correct and unchanged.   Changes to medications: Jhan reports no changes at this time.  Changes to insurance: No  Questions for the pharmacist: No    Confirmed patient received Welcome Packet with first shipment. The patient will receive a drug information handout for each medication shipped and additional FDA Medication Guides as required.       DISEASE/MEDICATION-SPECIFIC INFORMATION        N/A    SPECIALTY MEDICATION ADHERENCE     Medication Adherence    Patient reported X missed doses in the last month: 0  Adherence tools used: patient uses a pill box to manage medications        Myfortic 180mg : 10 days worth of medication on hand.   Prograf 0.5mg : 10 days worth of medication on hand.         SHIPPING     Shipping address confirmed in Epic.     Delivery Scheduled: Yes, Expected medication delivery date: 11/04/19.     Medication will be delivered via UPS to the prescription address in Epic [facility].    [place] A Alpha Chouinard   [facility] Specialty Technician

## 2019-11-03 MED ORDER — TESTOSTERONE 20.25 MG/1.25 GRAM (1.62 %) TRANSDERMAL GEL PUMP
Freq: Every day | TRANSDERMAL | 2 refills | 30 days | Status: CN
Start: 2019-11-03 — End: ?

## 2019-11-03 MED FILL — PROGRAF 0.5 MG CAPSULE: 30 days supply | Qty: 90 | Fill #6 | Status: AC

## 2019-11-03 MED FILL — MYFORTIC 180 MG TABLET,DELAYED RELEASE: ORAL | 30 days supply | Qty: 60 | Fill #3

## 2019-11-03 MED FILL — MYFORTIC 180 MG TABLET,DELAYED RELEASE: 30 days supply | Qty: 60 | Fill #3 | Status: AC

## 2019-11-03 MED FILL — PROGRAF 0.5 MG CAPSULE: 30 days supply | Qty: 90 | Fill #6

## 2019-11-06 DIAGNOSIS — E349 Endocrine disorder, unspecified: Principal | ICD-10-CM

## 2019-11-06 MED ORDER — TESTOSTERONE 20.25 MG/1.25 GRAM (1.62 %) TRANSDERMAL GEL PUMP
Freq: Every day | TRANSDERMAL | 2 refills | 30.00000 days | Status: CN
Start: 2019-11-06 — End: ?

## 2019-11-07 MED ORDER — TESTOSTERONE 20.25 MG/1.25 GRAM (1.62 %) TRANSDERMAL GEL PUMP
Freq: Every day | TRANSDERMAL | 2 refills | 30 days
Start: 2019-11-07 — End: ?

## 2019-11-10 MED ORDER — TESTOSTERONE 20.25 MG/1.25 GRAM (1.62 %) TRANSDERMAL GEL PUMP
Freq: Every day | TRANSDERMAL | 2 refills | 30.00000 days
Start: 2019-11-10 — End: ?

## 2019-11-12 DIAGNOSIS — E349 Endocrine disorder, unspecified: Principal | ICD-10-CM

## 2019-11-14 DIAGNOSIS — E349 Endocrine disorder, unspecified: Principal | ICD-10-CM

## 2019-11-14 MED ORDER — TESTOSTERONE 20.25 MG/1.25 GRAM (1.62 %) TRANSDERMAL GEL PUMP
Freq: Every day | TRANSDERMAL | 2 refills | 30.00000 days | Status: CP
Start: 2019-11-14 — End: 2019-11-14

## 2019-11-14 NOTE — Unmapped (Signed)
PCP needs to be the provider to refill testosterone gel.

## 2019-11-19 NOTE — Unmapped (Signed)
Received epic msg again from patient requesting refill for his testosterone after this coordinator explained that he needed to see his pcp to manage this medication for him. Left VM for him explaining this and requesting he leave preferred lab info for coordinator to place updated standing txp lab orders, since no lab results seen in 8 mos.

## 2019-12-05 NOTE — Unmapped (Signed)
[facility] Refill Coordination Note    Specialty Medication(s) to be Shipped:   Transplant: Myfortic 180mg  and Prograf 0.5mg     Other medication(s) to be shipped: n/a     Richard [name], DOB: 1965/12/18  Phone: [phone] (home) [phone] (work)      All above HIPAA information was verified with patient.     Was a Nurse, learning disability used for this call? No    Completed refill call assessment today to schedule patient's medication shipment from the [facility] [phone]).       Specialty medication(s) and dose(s) confirmed: Regimen is correct and unchanged.   Changes to medications: [name] reports no changes at this time.  Changes to insurance: No  Questions for the pharmacist: No    Confirmed patient received Welcome Packet with first shipment. The patient will receive a drug information handout for each medication shipped and additional FDA Medication Guides as required.       DISEASE/MEDICATION-SPECIFIC INFORMATION        N/A    SPECIALTY MEDICATION ADHERENCE     Medication Adherence    Patient reported X missed doses in the last month: 0  Specialty Medication: myfortic 180mg   Patient is on additional specialty medications: Yes  Additional Specialty Medications: Prograf 0.5mg   Patient Reported Additional Medication X Missed Doses in the Last Month: 0  Patient is on more than two specialty medications: No  Adherence tools used: patient uses a pill box to manage medications                Prograf 0.5mg   : 4 days of medicine on hand   Myfortic 180mg   : 4 days of medicine on hand         SHIPPING     Shipping address confirmed in Epic.     Delivery Scheduled: Yes, Expected medication delivery date: 6/15.     Medication will be delivered via UPS to the prescription address in Epic [facility].    Westley Gambles   [facility] Specialty Technician

## 2019-12-05 NOTE — Unmapped (Signed)
The [facility] has made a third and final attempt to reach this patient to refill the following medication: myfortic and prograf.      We have left voicemails on the following phone numbers: [phone].    Dates contacted: 06/03 06/07 06/11   Last scheduled delivery: 11/04/2019    The patient may be at risk of non-compliance with this medication. The patient should call the [facility] at [phone] (option 4) to refill medication.    Oretha Milch   [facility] Specialty Technician

## 2019-12-08 MED FILL — PROGRAF 0.5 MG CAPSULE: 30 days supply | Qty: 90 | Fill #7

## 2019-12-08 MED FILL — MYFORTIC 180 MG TABLET,DELAYED RELEASE: ORAL | 30 days supply | Qty: 60 | Fill #4

## 2019-12-08 MED FILL — PROGRAF 0.5 MG CAPSULE: 30 days supply | Qty: 90 | Fill #7 | Status: AC

## 2019-12-08 MED FILL — MYFORTIC 180 MG TABLET,DELAYED RELEASE: 30 days supply | Qty: 60 | Fill #4 | Status: AC

## 2019-12-31 NOTE — Unmapped (Signed)
[facility] Clinical Assessment & Refill Coordination Note    Richard [name], DOB: Sep 13, 1965  Phone: [phone] (home) [phone] (work)    All above HIPAA information was verified with patient.     Was a Nurse, learning disability used for this call? No    Specialty Medication(s):   Transplant: Myfortic 180mg  and Prograf 0.5mg      Current Outpatient Medications   Medication Sig Dispense Refill   ??? aspirin (ECOTRIN) 81 MG tablet Take 81 mg by mouth daily.     ??? CHOLECALCIFEROL, VITAMIN D3, (VITAMIN D3 ORAL) Take 2,000 Units by mouth daily.     ??? insulin lispro 100 unit/mL inph Inject 20 Units under the skin daily.     ??? metFORMIN (GLUCOPHAGE) 500 MG tablet Take 500 mg by mouth 2 (two) times a day with meals.     ??? multivitamin,tx-iron-minerals (COMPLETE MULTIVITAMIN) Tab Take by mouth daily.     ??? mycophenolate (MYFORTIC) 180 MG EC tablet Take 1 tablet (180 mg total) by mouth two (2) times a day. 60 tablet 11   ??? MYFORTIC 180 mg EC tablet Take 1 tablet (180 mg total) by mouth Two (2) times a day. (Patient taking differently: Take 180 mg by mouth Two (2) times a day. Script sent for MAPS for assistance) 180 tablet 3   ??? tacrolimus (PROGRAF) 0.5 MG capsule Take 2 capsules (1mg ) by mouth in the morning and 1 capsule (0.5mg ) in the evening. 270 capsule 3     No current facility-administered medications for this visit.        Changes to medications: Richard [name] reports no changes at this time.    Allergies   Allergen Reactions   ??? Insulin Asp Prt-Insulin Aspart      Other reaction(s): Other  Burning on injection and skin reaction.     ??? Insulin Aspart Other (See Comments)     Burning on injection and skin rash (itchy)       Changes to allergies: No    SPECIALTY MEDICATION ADHERENCE     Prograf 0.5mg   : 10 days of medicine on hand   Myfortic 180mg   : 10 days of medicine on hand     Medication Adherence    Patient reported X missed doses in the last month: 0  Specialty Medication: prograf 0.5mg   Patient is on additional specialty medications: Yes  Additional Specialty Medications: myfortic 180mg   Patient Reported Additional Medication X Missed Doses in the Last Month: 0  Adherence tools used: patient uses a pill box to manage medications          Specialty medication(s) dose(s) confirmed: Regimen is correct and unchanged.     Are there any concerns with adherence? No    Adherence counseling provided? Not needed    CLINICAL MANAGEMENT AND INTERVENTION      Clinical Benefit Assessment:    Do you feel the medicine is effective or helping your condition? Yes    Clinical Benefit counseling provided? Not needed    Adverse Effects Assessment:    Are you experiencing any side effects? No    Are you experiencing difficulty administering your medicine? No    Quality of Life Assessment:    How many days over the past month did your transplant  keep you from your normal activities? For example, brushing your teeth or getting up in the morning. 0    Have you discussed this with your provider? Not needed    Therapy Appropriateness:  Is therapy appropriate? Yes, therapy is appropriate and should be continued    DISEASE/MEDICATION-SPECIFIC INFORMATION      N/A    PATIENT SPECIFIC NEEDS     - Does the patient have any physical, cognitive, or cultural barriers? No    - Is the patient high risk? Yes, patient is taking a REMS drug. Medication is dispensed in compliance with REMS program.     - Does the patient require a Care Management Plan? No     - Does the patient require physician intervention or other additional services (i.e. nutrition, smoking cessation, social work)? No      SHIPPING     Specialty Medication(s) to be Shipped:   Transplant: Myfortic 180mg  and Prograf 0.5mg     Other medication(s) to be shipped: na     Changes to insurance: No    Delivery Scheduled: Yes, Expected medication delivery date: 01/07/2020.     Medication will be delivered via UPS to the confirmed prescription address in [facility].    The patient will receive a drug information handout for each medication shipped and additional FDA Medication Guides as required.  Verified that patient has previously received a Conservation officer, historic buildings.    All of the patient's questions and concerns have been addressed.    [name]   [facility] Specialty Pharmacist

## 2020-01-06 MED FILL — PROGRAF 0.5 MG CAPSULE: 30 days supply | Qty: 90 | Fill #8 | Status: AC

## 2020-01-06 MED FILL — MYFORTIC 180 MG TABLET,DELAYED RELEASE: 30 days supply | Qty: 60 | Fill #5 | Status: AC

## 2020-01-06 MED FILL — PROGRAF 0.5 MG CAPSULE: 30 days supply | Qty: 90 | Fill #8

## 2020-01-06 MED FILL — MYFORTIC 180 MG TABLET,DELAYED RELEASE: ORAL | 30 days supply | Qty: 60 | Fill #5

## 2020-01-28 NOTE — Unmapped (Signed)
[facility] Refill Coordination Note    Specialty Medication(s) to be Shipped:   Transplant: Myfortic 180mg  and Prograf 0.5mg     Other medication(s) to be shipped: No additional medications requested for fill at this time     Richard [name], DOB: 24-Sep-1965  Phone: [phone] (home) [phone] (work)      All above HIPAA information was verified with patient.     Was a Nurse, learning disability used for this call? No    Completed refill call assessment today to schedule patient's medication shipment from the [facility] [phone]).       Specialty medication(s) and dose(s) confirmed: Regimen is correct and unchanged.   Changes to medications: Richard [name] reports no changes at this time.  Changes to insurance: No  Questions for the pharmacist: No    Confirmed patient received Welcome Packet with first shipment. The patient will receive a drug information handout for each medication shipped and additional FDA Medication Guides as required.       DISEASE/MEDICATION-SPECIFIC INFORMATION        N/A    SPECIALTY MEDICATION ADHERENCE     Medication Adherence    Patient reported X missed doses in the last month: 0  Specialty Medication: Myfortic 180mg   Patient is on additional specialty medications: Yes  Additional Specialty Medications: Prograf 0.5mg   Patient Reported Additional Medication X Missed Doses in the Last Month: 0  Patient is on more than two specialty medications: No  Adherence tools used: patient uses a pill box to manage medications        Myfortic 180 mg: 9 days of medicine on hand   Prograf 0.5 mg: 9 days of medicine on hand     SHIPPING     Shipping address confirmed in Epic.     Delivery Scheduled: Yes, Expected medication delivery date: 02/05/2020.     Medication will be delivered via UPS to the prescription address in Epic [facility].    Richard [name] [facility] Specialty Technician

## 2020-02-04 MED FILL — PROGRAF 0.5 MG CAPSULE: 30 days supply | Qty: 90 | Fill #9 | Status: AC

## 2020-02-04 MED FILL — MYFORTIC 180 MG TABLET,DELAYED RELEASE: ORAL | 30 days supply | Qty: 60 | Fill #6

## 2020-02-04 MED FILL — PROGRAF 0.5 MG CAPSULE: 30 days supply | Qty: 90 | Fill #9

## 2020-02-04 MED FILL — MYFORTIC 180 MG TABLET,DELAYED RELEASE: 30 days supply | Qty: 60 | Fill #6 | Status: AC

## 2020-02-27 NOTE — Unmapped (Signed)
[facility] Refill Coordination Note    Specialty Medication(s) to be Shipped:   Transplant: Myfortic 180mg  and Prograf 0.5mg     Other medication(s) to be shipped: No additional medications requested for fill at this time     Richard [name], DOB: 1966-03-21  Phone: [phone] (home) [phone] (work)      All above HIPAA information was verified with patient.     Was a Nurse, learning disability used for this call? No    Completed refill call assessment today to schedule patient's medication shipment from the [facility] [phone]).       Specialty medication(s) and dose(s) confirmed: Regimen is correct and unchanged.   Changes to medications: Quang reports no changes at this time.  Changes to insurance: No  Questions for the pharmacist: No    Confirmed patient received Welcome Packet with first shipment. The patient will receive a drug information handout for each medication shipped and additional FDA Medication Guides as required.       DISEASE/MEDICATION-SPECIFIC INFORMATION        N/A    SPECIALTY MEDICATION ADHERENCE     Medication Adherence    Patient reported X missed doses in the last month: 0  Specialty Medication: Myfortic 180mg   Patient is on additional specialty medications: Yes  Additional Specialty Medications: Prograf 0.5mg   Patient Reported Additional Medication X Missed Doses in the Last Month: 0  Patient is on more than two specialty medications: No  Adherence tools used: patient uses a pill box to manage medications                Myfortic 180 mg: 7 days of medicine on hand   Prograf 0.5 mg: 7 days of medicine on hand         SHIPPING     Shipping address confirmed in Epic.     Delivery Scheduled: Yes, Expected medication delivery date: 03/04/20.     Medication will be delivered via UPS to the prescription address in Epic [facility].    Tera Helper   [facility] Specialty Pharmacist

## 2020-03-02 NOTE — Unmapped (Signed)
Pt called to schedule annual appt. Asked him if he had received letter that his PA had left, and he said he did not. Then he asked if he could be scheduled with Dr. Julieta Gutting, and I told him he had left to go to [facility]. Then told pt that this tpa will need to ask primary coord when she returns from vacation who will be his provider and then can call him to schedule annual appt. Pt said that's fine and verbalized understanding of all discussed.

## 2020-03-03 DIAGNOSIS — Z944 Liver transplant status: Principal | ICD-10-CM

## 2020-03-03 MED FILL — PROGRAF 0.5 MG CAPSULE: 30 days supply | Qty: 90 | Fill #10 | Status: AC

## 2020-03-03 MED FILL — PROGRAF 0.5 MG CAPSULE: 30 days supply | Qty: 90 | Fill #10

## 2020-03-03 MED FILL — MYFORTIC 180 MG TABLET,DELAYED RELEASE: 30 days supply | Qty: 60 | Fill #7 | Status: AC

## 2020-03-03 MED FILL — MYFORTIC 180 MG TABLET,DELAYED RELEASE: ORAL | 30 days supply | Qty: 60 | Fill #7

## 2020-03-03 NOTE — Unmapped (Signed)
Called pt back and said primary coord back in town and wants pt to now see Dr. Eudelia Bunch who took Dr. Luisa Dago place since PA has also left. Pt said that sounded good and to schedule him whenever and he'll be there. Confirmed he's available on 11/9 since that's the one day Dr. Eudelia Bunch has a morning appt and that pt would like letter thru mychart. Pt also said that he's almost Stage 5 CKD bc of the prograf he's been taking for 8 years and will need kidney tx. York Spaniel he was not being worked up yet and verbalized understanding of all discussed.

## 2020-03-11 MED ORDER — INSULIN DETEMIR (U-100) 100 UNIT/ML (3 ML) SUBCUTANEOUS PEN
SUBCUTANEOUS | 0 days
Start: 2020-03-11 — End: ?

## 2020-03-24 NOTE — Unmapped (Signed)
[facility] Clinical Assessment & Refill Coordination Note    Richard [name], DOB: Oct 30, 1965  Phone: [phone] (home) [phone] (work)    All above HIPAA information was verified with patient.     Was a Nurse, learning disability used for this call? No    Specialty Medication(s):   Transplant: Myfortic 180mg  and Prograf 0.5mg      Current Outpatient Medications   Medication Sig Dispense Refill   ??? aspirin (ECOTRIN) 81 MG tablet Take 81 mg by mouth daily.     ??? CHOLECALCIFEROL, VITAMIN D3, (VITAMIN D3 ORAL) Take 2,000 Units by mouth daily.     ??? insulin lispro 100 unit/mL inph Inject 20 Units under the skin daily.     ??? metFORMIN (GLUCOPHAGE) 500 MG tablet Take 500 mg by mouth 2 (two) times a day with meals.     ??? multivitamin,tx-iron-minerals (COMPLETE MULTIVITAMIN) Tab Take by mouth daily.     ??? mycophenolate (MYFORTIC) 180 MG EC tablet Take 1 tablet (180 mg total) by mouth two (2) times a day. 60 tablet 11   ??? MYFORTIC 180 mg EC tablet Take 1 tablet (180 mg total) by mouth Two (2) times a day. (Patient taking differently: Take 180 mg by mouth Two (2) times a day. Script sent for [employer] for assistance) 180 tablet 3   ??? tacrolimus (PROGRAF) 0.5 MG capsule Take 2 capsules (1mg ) by mouth in the morning and 1 capsule (0.5mg ) in the evening. 270 capsule 3     No current facility-administered medications for this visit.        Changes to medications: Richard [name] reports no changes at this time.    Allergies   Allergen Reactions   ??? Insulin Asp Prt-Insulin Aspart      Other reaction(s): Other  Burning on injection and skin reaction.     ??? Insulin Aspart Other (See Comments)     Burning on injection and skin rash (itchy)       Changes to allergies: No    SPECIALTY MEDICATION ADHERENCE     Prograf 0.5mg   : 10 days of medicine on hand   Myfortic 180mg   : 10 days of medicine on hand       Medication Adherence    Patient reported X missed doses in the last month: 0  Specialty Medication: prograf 0.5mg   Patient is on additional specialty medications: Yes  Additional Specialty Medications: Myfortic 180mg   Patient Reported Additional Medication X Missed Doses in the Last Month: 0  Adherence tools used: patient uses a pill box to manage medications          Specialty medication(s) dose(s) confirmed: Regimen is correct and unchanged.     Are there any concerns with adherence? No    Adherence counseling provided? Not needed    CLINICAL MANAGEMENT AND INTERVENTION      Clinical Benefit Assessment:    Do you feel the medicine is effective or helping your condition? Yes    Clinical Benefit counseling provided? Not needed    Adverse Effects Assessment:    Are you experiencing any side effects? No    Are you experiencing difficulty administering your medicine? No    Quality of Life Assessment:    How many days over the past month did your transplant  keep you from your normal activities? For example, brushing your teeth or getting up in the morning. 0    Have you discussed this with your provider? Not needed    Therapy Appropriateness:  Is therapy appropriate? Yes, therapy is appropriate and should be continued    DISEASE/MEDICATION-SPECIFIC INFORMATION      N/A    PATIENT SPECIFIC NEEDS     - Does the patient have any physical, cognitive, or cultural barriers? No    - Is the patient high risk? Yes, patient is taking a REMS drug. Medication is dispensed in compliance with REMS program    - Does the patient require a Care Management Plan? No     - Does the patient require physician intervention or other additional services (i.e. nutrition, smoking cessation, social work)? No      SHIPPING     Specialty Medication(s) to be Shipped:   Transplant: Myfortic 180mg  and Prograf 0.5mg     Other medication(s) to be shipped: No additional medications requested for fill at this time     Changes to insurance: No    Delivery Scheduled: Yes, Expected medication delivery date: 03/31/2020.     Medication will be delivered via UPS to the confirmed prescription address in [facility].    The patient will receive a drug information handout for each medication shipped and additional FDA Medication Guides as required.  Verified that patient has previously received a Conservation officer, historic buildings.    All of the patient's questions and concerns have been addressed.    [name]   [facility] Specialty Pharmacist

## 2020-03-29 NOTE — Unmapped (Signed)
TRF

## 2020-03-30 MED FILL — MYFORTIC 180 MG TABLET,DELAYED RELEASE: ORAL | 30 days supply | Qty: 60 | Fill #8

## 2020-03-30 MED FILL — MYFORTIC 180 MG TABLET,DELAYED RELEASE: 30 days supply | Qty: 60 | Fill #8 | Status: AC

## 2020-03-30 MED FILL — PROGRAF 0.5 MG CAPSULE: 30 days supply | Qty: 90 | Fill #11 | Status: AC

## 2020-03-30 MED FILL — PROGRAF 0.5 MG CAPSULE: 30 days supply | Qty: 90 | Fill #11

## 2020-03-31 MED ORDER — HYDRALAZINE 25 MG TABLET
Freq: Three times a day (TID) | 0 days
Start: 2020-03-31 — End: ?

## 2020-04-01 MED ORDER — MYFORTIC 180 MG TABLET,DELAYED RELEASE
ORAL_TABLET | Freq: Two times a day (BID) | ORAL | 3 refills | 90 days | Status: CN
Start: 2020-04-01 — End: 2021-04-01

## 2020-04-15 MED ORDER — FUROSEMIDE 40 MG TABLET
Freq: Every day | 0.00000 days
Start: 2020-04-15 — End: ?

## 2020-04-20 MED ORDER — TESTOSTERONE 20.25 MG/1.25 GRAM (1.62 %) TRANSDERMAL GEL PUMP
0 days
Start: 2020-04-20 — End: 2020-10-17

## 2020-04-21 DIAGNOSIS — Z944 Liver transplant status: Principal | ICD-10-CM

## 2020-04-21 DIAGNOSIS — Z79899 Other long term (current) drug therapy: Principal | ICD-10-CM

## 2020-04-21 MED ORDER — MYCOPHENOLATE SODIUM 180 MG TABLET,DELAYED RELEASE
ORAL_TABLET | Freq: Two times a day (BID) | ORAL | 11 refills | 30.00000 days | Status: CP
Start: 2020-04-21 — End: ?
  Filled 2020-04-29: qty 60, 30d supply, fill #0

## 2020-04-21 MED ORDER — TACROLIMUS 0.5 MG CAPSULE, IMMEDIATE-RELEASE
ORAL_CAPSULE | 3 refills | 0 days | Status: CP
Start: 2020-04-21 — End: ?
  Filled 2020-04-29: qty 90, 30d supply, fill #0

## 2020-04-21 NOTE — Unmapped (Signed)
Patient has requested a medication refill via EPIC

## 2020-04-21 NOTE — Unmapped (Signed)
[facility] Refill Coordination Note    Specialty Medication(s) to be Shipped:   Transplant: Myfortic 180mg  and Prograf 0.5mg     Other medication(s) to be shipped: No additional medications requested for fill at this time     Richard [name], DOB: 05-12-1966  Phone: [phone] (home) [phone] (work)      All above HIPAA information was verified with patient.     Was a Nurse, learning disability used for this call? No    Completed refill call assessment today to schedule patient's medication shipment from the [facility] [phone]).       Specialty medication(s) and dose(s) confirmed: Regimen is correct and unchanged.   Changes to medications: Edie reports no changes at this time.  Changes to insurance: No  Questions for the pharmacist: No    Confirmed patient received Welcome Packet with first shipment. The patient will receive a drug information handout for each medication shipped and additional FDA Medication Guides as required.       DISEASE/MEDICATION-SPECIFIC INFORMATION        N/A    SPECIALTY MEDICATION ADHERENCE     Medication Adherence    Patient reported X missed doses in the last month: 0  Specialty Medication: Prograf 0.5mg   Patient is on additional specialty medications: Yes  Additional Specialty Medications: Myfortic 180mg   Patient Reported Additional Medication X Missed Doses in the Last Month: 0  Patient is on more than two specialty medications: No  Adherence tools used: patient uses a pill box to manage medications        Prograf 0.5 mg: 10 days of medicine on hand   Myfortic 180 mg: 10 days of medicine on hand     SHIPPING     Shipping address confirmed in Epic.     Delivery Scheduled: Yes, Expected medication delivery date: 04/28/2020.     Medication will be delivered via UPS to the prescription address in Epic WAM.    [name] [facility] Specialty Technician

## 2020-04-22 NOTE — Unmapped (Signed)
Richard [name] 's entire order shipment will be delayed as a result of the medication is too soon to refill until 04/29/20.     I have reached out to the patient and communicated the delivery change. We will reschedule the medication for the delivery date that the patient agreed upon.  We have confirmed the delivery date as 04/30/20, via ups.

## 2020-04-26 MED ORDER — CARVEDILOL 6.25 MG TABLET
Freq: Two times a day (BID) | 0 days
Start: 2020-04-26 — End: ?

## 2020-04-26 MED ORDER — ISOSORBIDE MONONITRATE ER 30 MG TABLET,EXTENDED RELEASE 24 HR
Freq: Every day | 0.00000 days
Start: 2020-04-26 — End: ?

## 2020-04-29 DIAGNOSIS — Z944 Liver transplant status: Principal | ICD-10-CM

## 2020-04-29 MED FILL — MYFORTIC 180 MG TABLET,DELAYED RELEASE: 30 days supply | Qty: 60 | Fill #0 | Status: AC

## 2020-04-29 MED FILL — PROGRAF 0.5 MG CAPSULE: 30 days supply | Qty: 90 | Fill #0 | Status: AC

## 2020-05-02 MED ORDER — METOLAZONE 2.5 MG TABLET
0 days
Start: 2020-05-02 — End: ?

## 2020-05-03 DIAGNOSIS — Z944 Liver transplant status: Principal | ICD-10-CM

## 2020-05-03 DIAGNOSIS — E612 Magnesium deficiency: Principal | ICD-10-CM

## 2020-05-03 DIAGNOSIS — Z5181 Encounter for therapeutic drug level monitoring: Principal | ICD-10-CM

## 2020-05-04 ENCOUNTER — Ambulatory Visit: Admit: 2020-05-04 | Discharge: 2020-05-05

## 2020-05-04 DIAGNOSIS — Z944 Liver transplant status: Principal | ICD-10-CM

## 2020-05-04 DIAGNOSIS — D849 Immunodeficiency, unspecified: Principal | ICD-10-CM

## 2020-05-04 DIAGNOSIS — N184 Chronic kidney disease, stage 4 (severe): Principal | ICD-10-CM

## 2020-05-04 DIAGNOSIS — Z5181 Encounter for therapeutic drug level monitoring: Principal | ICD-10-CM

## 2020-05-04 DIAGNOSIS — E612 Magnesium deficiency: Principal | ICD-10-CM

## 2020-05-04 DIAGNOSIS — Z79899 Other long term (current) drug therapy: Principal | ICD-10-CM

## 2020-05-04 DIAGNOSIS — D84821 Immunosuppression due to drug therapy (CMS-HCC): Principal | ICD-10-CM

## 2020-05-04 LAB — CBC W/ AUTO DIFF
BASOPHILS ABSOLUTE COUNT: 0 10*9/L (ref 0.0–0.1)
BASOPHILS RELATIVE PERCENT: 0.4 %
EOSINOPHILS ABSOLUTE COUNT: 0.2 10*9/L (ref 0.0–0.4)
EOSINOPHILS RELATIVE PERCENT: 1.6 %
HEMATOCRIT: 45.1 % (ref 41.0–53.0)
HEMOGLOBIN: 15.2 g/dL (ref 13.5–17.5)
LARGE UNSTAINED CELLS: 1 % (ref 0–4)
LYMPHOCYTES ABSOLUTE COUNT: 1.1 10*9/L — ABNORMAL LOW (ref 1.5–5.0)
LYMPHOCYTES RELATIVE PERCENT: 12.2 %
MEAN CORPUSCULAR HEMOGLOBIN CONC: 33.8 g/dL (ref 31.0–37.0)
MEAN CORPUSCULAR HEMOGLOBIN: 27.9 pg (ref 26.0–34.0)
MEAN CORPUSCULAR VOLUME: 82.7 fL (ref 80.0–100.0)
MEAN PLATELET VOLUME: 8.1 fL (ref 7.0–10.0)
MONOCYTES ABSOLUTE COUNT: 0.4 10*9/L (ref 0.2–0.8)
MONOCYTES RELATIVE PERCENT: 3.8 %
NEUTROPHILS ABSOLUTE COUNT: 7.5 10*9/L (ref 2.0–7.5)
NEUTROPHILS RELATIVE PERCENT: 81 %
PLATELET COUNT: 273 10*9/L (ref 150–440)
RED BLOOD CELL COUNT: 5.46 10*12/L (ref 4.50–5.90)
RED CELL DISTRIBUTION WIDTH: 13.5 % (ref 12.0–15.0)
WBC ADJUSTED: 9.2 10*9/L (ref 4.5–11.0)

## 2020-05-04 LAB — CBC W/ DIFFERENTIAL
HEMATOCRIT: 35.9 % — ABNORMAL LOW
HEMATOCRIT: 41.2 % — ABNORMAL LOW
HEMATOCRIT: 37.3 % — ABNORMAL LOW
HEMOGLOBIN: 11.8 g/dL — ABNORMAL LOW
HEMOGLOBIN: 13.7 g/dL — ABNORMAL LOW
HEMOGLOBIN: 12.2 g/dL — ABNORMAL LOW
PLATELET COUNT: 207 10*9/L
PLATELET COUNT: 214 10*9/L
PLATELET COUNT: 292 10*9/L
WBC ADJUSTED: 5.6 10*9/L
WBC ADJUSTED: 5.7 10*9/L
WHITE BLOOD CELL COUNT: 4.9 10*9/L

## 2020-05-04 LAB — COMPREHENSIVE METABOLIC PANEL
ALBUMIN: 3.7 g/dL (ref 3.4–5.0)
ALKALINE PHOSPHATASE: 112 U/L (ref 46–116)
ALKALINE PHOSPHATASE: 83 U/L
ALKALINE PHOSPHATASE: 84 U/L
ALKALINE PHOSPHATASE: 93 U/L
ALKALINE PHOSPHATASE: 81 U/L
ALT (SGPT): 23 U/L (ref 10–49)
ALT (SGPT): 13 U/L
ALT (SGPT): 14 U/L
ALT (SGPT): 21 U/L
ALT (SGPT): 41 U/L
ANION GAP: 12 mmol/L (ref 5–14)
AST (SGOT): 26 U/L (ref <=34)
AST (SGOT): 13 U/L
AST (SGOT): 15 U/L
AST (SGOT): 22 U/L
AST (SGOT): 32 U/L
BILIRUBIN TOTAL: 1.1 mg/dL (ref 0.3–1.2)
BILIRUBIN TOTAL: 0.7 mg/dL
BILIRUBIN TOTAL: 0.8 mg/dL
BILIRUBIN TOTAL: 0.8 mg/dL
BILIRUBIN TOTAL: 0.8 mg/dL
BLOOD UREA NITROGEN: 34 mg/dL — ABNORMAL HIGH (ref 9–23)
BLOOD UREA NITROGEN: 28 mg/dL — ABNORMAL HIGH
BLOOD UREA NITROGEN: 36 mg/dL — ABNORMAL HIGH
BLOOD UREA NITROGEN: 38 mg/dL — ABNORMAL HIGH
BLOOD UREA NITROGEN: 50 mg/dL — ABNORMAL HIGH
BUN / CREAT RATIO: 11
CALCIUM: 9.5 mg/dL (ref 8.7–10.4)
CALCIUM: 8.3 mg/dL — ABNORMAL LOW
CALCIUM: 8.6 mg/dL
CALCIUM: 9.2 mg/dL
CALCIUM: 9.3 mg/dL
CHLORIDE: 98 mmol/L (ref 98–107)
CHLORIDE: 100 mmol/L
CHLORIDE: 102 mmol/L
CHLORIDE: 103 mmol/L
CHLORIDE: 106 mmol/L
CO2: 30 mmol/L (ref 20.0–31.0)
CO2: 26 mmol/L
CO2: 28 mmol/L
CO2: 28 mmol/L
CO2: 28 mmol/L
CREATININE: 3.02 mg/dL — ABNORMAL HIGH
CREATININE: 2.43 mg/dL — ABNORMAL HIGH
CREATININE: 2.53 mg/dL — ABNORMAL HIGH
CREATININE: 2.64 mg/dL — ABNORMAL HIGH
CREATININE: 3.14 mg/dL — ABNORMAL HIGH
EGFR CKD-EPI AA MALE: 26 mL/min/{1.73_m2} — ABNORMAL LOW (ref >=60)
EGFR CKD-EPI NON-AA MALE: 23 mL/min/{1.73_m2} — ABNORMAL LOW (ref >=60)
GLUCOSE RANDOM: 138 mg/dL — ABNORMAL HIGH (ref 70–99)
GLUCOSE RANDOM: 172 mg/dL — ABNORMAL HIGH
GLUCOSE RANDOM: 182 mg/dL — ABNORMAL HIGH
GLUCOSE RANDOM: 191 mg/dL — ABNORMAL HIGH
GLUCOSE RANDOM: 51 mg/dL — ABNORMAL LOW
POTASSIUM: 3.4 mmol/L (ref 3.4–4.5)
POTASSIUM: 3.3 mmol/L — ABNORMAL LOW
POTASSIUM: 3.8 mmol/L
POTASSIUM: 4 mmol/L
POTASSIUM: 3.9 mmol/L
PROTEIN TOTAL: 7.5 g/dL (ref 5.7–8.2)
PROTEIN TOTAL: 6.1 g/dL
PROTEIN TOTAL: 6.2 g/dL
PROTEIN TOTAL: 6.6 g/dL
PROTEIN TOTAL: 6.1 g/dL
SODIUM: 140 mmol/L (ref 135–145)
SODIUM: 137 mmol/L
SODIUM: 138 mmol/L
SODIUM: 139 mmol/L
SODIUM: 144 mmol/L

## 2020-05-04 LAB — ALBUMIN
ALBUMIN: 3.5 g/dL
ALBUMIN: 3.6 g/dL
ALBUMIN: 3.8 g/dL
ALBUMIN: 3.6 g/dL

## 2020-05-04 LAB — MAGNESIUM
MAGNESIUM: 1.8 mg/dL (ref 1.6–2.6)
MAGNESIUM: 1.7 mg/dL — ABNORMAL LOW
MAGNESIUM: 1.8 mg/dL
MAGNESIUM: 1.8 mg/dL
MAGNESIUM: 1.9 mg/dL

## 2020-05-04 LAB — TACROLIMUS LEVEL, TROUGH
TACROLIMUS, TROUGH: 5 ng/mL (ref 5.0–15.0)
TACROLIMUS, TROUGH: 4.8 ng/mL
TACROLIMUS, TROUGH: 5.4 ng/mL

## 2020-05-04 LAB — GAMMA GT: GAMMA GLUTAMYL TRANSFERASE: 44 U/L

## 2020-05-04 LAB — BILIRUBIN, DIRECT: BILIRUBIN DIRECT: 0.3 mg/dL (ref 0.00–0.30)

## 2020-05-04 LAB — PHOSPHORUS
PHOSPHORUS: 3 mg/dL (ref 2.4–5.1)
PHOSPHORUS: 3.4 mg/dL
PHOSPHORUS: 3.6 mg/dL

## 2020-05-04 MED ORDER — COVID-19 VACCINE, MRNA,(PFIZER) (PF) 30 MCG/0.3 ML IM SUSP (PURPLE)
Freq: Once | INTRAMUSCULAR | 0 refills | 1 days | Status: CP
Start: 2020-05-04 — End: 2020-05-04

## 2020-05-04 MED ORDER — TACROLIMUS 0.5 MG CAPSULE, IMMEDIATE-RELEASE
ORAL_CAPSULE | Freq: Two times a day (BID) | ORAL | 3 refills | 90 days | Status: CP
Start: 2020-05-04 — End: 2020-05-05

## 2020-05-04 NOTE — Unmapped (Addendum)
[facility]  Post-liver transplant follow-up visit  05/04/2020     Assessment/Plan     Richard [name] is a 54 y.o. male status post liver transplantation on 03/02/2012 (Liver) for Cirrhosis: Fatty Liver [name]). He is morbidly obese with insulin-requiring diabetes mellitus and hypertension. He also has CKD and has a first visit with a nephrologist scheduled. We discussed the risk of recurrent NASH and I counseled him to work on weight loss and control of his diabetes mellitus and hypertension. He has poor insight into this as he attributes much of his weight to fluid retention from CKD. He had COVID-19 in January 2021 but has been reluctant to get vaccinated. We discussed the risks and benefits and he agreed to get the first COVID-19 mRNA vaccine today.    Liver replaced by transplant (CMS-HCC)  ?? He is at risk for recurrent NASH and I counseled him to work on better control of his metabolic risk factors.  ?? Age-appropriate cancer screening.    Immunosuppression due to drug therapy (CMS-HCC)  ?? Await tacrolimus trough today. Consider decreasing the dose to 0.5 mg BID to aim for a target trough of 2-4 ng/mL to minimize nephrotoxicity.   ?? Continue Myfortic 180 mg BID.  ?? First COVID-19 mRNA vaccine today. I instructed him to get 2 more shots.  ?? I counseled him to continue to take precautions such as masking and avoiding crowds because the COVID-19 vaccines are less effective in immunocompromised individuals than in the general population.    CKD (chronic kidney disease) stage 4, GFR 15-29 ml/min (CMS-HCC)  ?? Follow up with nephrology.    Morbid obesity (CMS-HCC)  ?? I counseled him to lose weight.    Return in about 1 year (around 05/04/2021).    Subjective    Richard [name] is a 54 y.o. male status post liver transplantation on 03/02/2012 (Liver) for Cirrhosis: Fatty Liver [name]),  here for follow-up. He also has diabetes mellitus, hypertension, morbid obesity, and CKD stage 3-4.      History of Present Illness Post-transplant: 8 years 2 months   Transplant date: 03/02/2012 (Liver)   Transplant diagnosis: Cirrhosis: Fatty Liver [name]),    Donor type: Donation after Brain Death   CIT / WIT: 445 min / 50 min   PHS increased risk: No   CMV status: D: Negative / R: Positive / Intermediate Risk (D-/R+ or D+/R+)   Rejections: None   Infections: None   Other complications: CKD   HCC surveillance: Not applicable (no history of HCC)   Current immunosuppression: tacrolimus and Myfortic       Interval History   Last seen by Tina Griffiths, PA-C 04/02/2019. He had COVID-19 in January 2021 and received monoclonal antibodies. He has been reluctant to get vaccinated for COVID-19 but reports he has received this season's influenza vaccine. He has progressive CKD and has never seen a nephrologist but reports he is scheduled for an initial visit.    Objective   Physical Exam   Vital Signs: Pulse 76  - Temp 36.8 ??C (98.2 ??F) (Tympanic)  - Ht 177.8 cm (5' 10)  - Wt (!) 138.8 kg (306 lb)  - SpO2 98%  - BMI 43.91 kg/m??    Constitutional: He is in no apparent distress.   Eyes: Anicteric sclerae.   Cardiovascular: Mild peripheral edema.   Gastrointestinal: Soft, nontender, morbidly obese abdomen. Healed abdominal surgical incision.   Neurologic: Nonfocal, no tremor.   Psychiatric: Alert and oriented to person,  place and time. Normal affect.       Labs   Appointment on 05/04/2020   Component Date Value   ??? GGT 05/04/2020 44    ??? Magnesium 05/04/2020 1.8    ??? Phosphorus 05/04/2020 3.0    ??? Bilirubin, Direct 05/04/2020 0.30    ??? Sodium 05/04/2020 140    ??? Potassium 05/04/2020 3.4    ??? Chloride 05/04/2020 98    ??? Anion Gap 05/04/2020 12    ??? CO2 05/04/2020 30.0    ??? BUN 05/04/2020 34*   ??? Creatinine 05/04/2020 3.02*   ??? BUN/Creatinine Ratio 05/04/2020 11    ??? EGFR CKD-EPI Non-African* 05/04/2020 23*   ??? EGFR CKD-EPI African Ame* 05/04/2020 26*   ??? Glucose 05/04/2020 138*   ??? Calcium 05/04/2020 9.5    ??? Albumin 05/04/2020 3.7    ??? Total Protein 05/04/2020 7.5    ??? Total Bilirubin 05/04/2020 1.1    ??? AST 05/04/2020 26    ??? ALT 05/04/2020 23    ??? Alkaline Phosphatase 05/04/2020 112    ??? WBC 05/04/2020 9.2    ??? RBC 05/04/2020 5.46    ??? HGB 05/04/2020 15.2    ??? HCT 05/04/2020 45.1    ??? MCV 05/04/2020 82.7    ??? [facility] 05/04/2020 27.9    ??? MCHC 05/04/2020 33.8    ??? RDW 05/04/2020 13.5    ??? MPV 05/04/2020 8.1    ??? Platelet 05/04/2020 273    ??? Neutrophils % 05/04/2020 81.0    ??? Lymphocytes % 05/04/2020 12.2    ??? Monocytes % 05/04/2020 3.8    ??? Eosinophils % 05/04/2020 1.6    ??? Basophils % 05/04/2020 0.4    ??? Absolute Neutrophils 05/04/2020 7.5    ??? Absolute Lymphocytes 05/04/2020 1.1*   ??? Absolute Monocytes 05/04/2020 0.4    ??? Absolute Eosinophils 05/04/2020 0.2    ??? Absolute Basophils 05/04/2020 0.0    ??? Large Unstained Cells 05/04/2020 1        Last tacrolimus trough 4.9 on 04/15/2020. Pending from today.    Studies   CXR 05/04/2020: clear lungs    Ultrasound Liver Transplant 05/04/2020: pending      Lovena Neighbours, MD, MSc  Professor of Medicine  Medical Director of Liver Transplantation    ADDENDUM:   Tacrolimus trough 5.0: reduce dose to 0.5 mg BID    Ultrasound Liver Transplant 05/04/2020:   Overall limited evaluation due to motion artifact and body habitus.  - Patent hepatic transplant vasculature.  - Stable resistive indices in the hepatic transplant arteries, within normal limits.  - Decreased velocities throughout the portal venous system from prior with sluggish main, left, and right posterior portal venous flow. Pulsatile portal venous flow. Continued attention on follow up.  - Mild splenomegaly, unchanged.  - Mildly echogenic kidneys bilaterally, suggestive of medical renal disease.

## 2020-05-04 NOTE — Unmapped (Signed)
Patient seen in clinic today for his annual liver txp f/u exam. He completed labs, cxr, and liver US today before appt. Patient appears unwell today, heavier than his last visit, and seems more sob. He reports in the last 12 months, he has been hospitalized twice for ARF and cardiac issues r/t his DM. He states he is not completely sure about the actual issues, but reports he had severe swelling in his LE, SOB with elevated BP. He has not been followed by a nephrologist, but has an upcoming appt on 11/29 to get established. His creatinine today is 3.04. Encouraged him to f/u with a cardiologist as well. He is now insulin dependent, but his understanding of his insulin administration instructions is limited. He reported he is on SS insulin, but routinely gives himself 25-30 unites of novolog. He said he obtains his insulin from [facility] [phone]). Clarified with pharmacist after appt that insulins are ordered by Dr.Bender and are not SS but set amts without glucose parameters. Encouraged patient to f/u with provider for more clear instruction.He reports he sees two pcps, Dr. Hessie Diener and Knox Royalty FNP.He is taking lasix for fluid management. Although metolazone is entered on his medication list, he does not take it. Dr. Eudelia Bunch examined patient and encouraged him to follow his DM closely and mange his wt and BP, explaining his risk of requiring NAFLD. Patient adamantly stated at least 30lbs of wt at this time if [facility].Patient had a recent colonoscopy and is not due until 2025. Spent 10 minutes in post-liver txp education, encouraging him to f/u with dermatology for cancer. He received flu shot in October with his recent hospitalization. After discussion with Dr.Fix, he was amenable to receiving his 1st covid vaccine today. Encouraged him to obtain the 2nd vaccine in 3 wks locally followed by the booster 30 days later. Dr.Fix reviewed patient's cxr, which was clear. [place] pending at time of appt. Encouraged patient to repeat labs at least every 2 months and to notify coordinator when he completes them, as they are not immediately visible in the chart. (Other labs are noted today under Care Everywhere at [facility].) Patient verbalized understanding of all discussed.Sent updated standing order to [employer].

## 2020-05-05 DIAGNOSIS — Z944 Liver transplant status: Principal | ICD-10-CM

## 2020-05-05 MED ORDER — TACROLIMUS 0.5 MG CAPSULE, IMMEDIATE-RELEASE
ORAL_CAPSULE | Freq: Two times a day (BID) | ORAL | 3 refills | 90 days | Status: CP
Start: 2020-05-05 — End: ?
  Filled 2020-06-02: qty 60, 30d supply, fill #0

## 2020-05-05 NOTE — Unmapped (Signed)
As discussed with patient during his annual today, Dr. Eudelia Bunch notified [facility] to have patient's tac dose reduced to 0.5mg  bid, recommending goal of 2-4 going forward to help his kidneys, and have him repeat labs in 1-2 weeks. Contacted patient and relayed recommendations. Reinforced with him to let [facility] know when he has labs drawn so they can be pulled from Care Everywhere or lab can be contacted for results. He verbalized understanding of all discussed.

## 2020-05-06 NOTE — Unmapped (Signed)
Clinical Assessment Needed For: Dose Change  Medication: Prograf  Last Fill Date/Day Supply: 04/26/20 / 30  Refill Too Soon until 12/04  Was previous dose already scheduled to fill: No    Notes to Pharmacist: None

## 2020-05-10 NOTE — Unmapped (Signed)
Patient's liver transplant [place] reviewed by Dr.Fix without recommendation for f/u before next annual.

## 2020-05-19 LAB — COMPREHENSIVE METABOLIC PANEL
ALBUMIN: 3.8
ALKALINE PHOSPHATASE: 105
ALT (SGPT): 17
ANION GAP: 11
AST (SGOT): 14
BILIRUBIN TOTAL: 1.1
BLOOD UREA NITROGEN: 54 — ABNORMAL HIGH
CALCIUM: 10.9 — ABNORMAL HIGH
CHLORIDE: 94
CO2: 32 — ABNORMAL HIGH
CREATININE: 3.71 — ABNORMAL HIGH
GLUCOSE RANDOM: 293 — ABNORMAL HIGH
POTASSIUM: 3.4 — ABNORMAL LOW
PROTEIN TOTAL: 7
SODIUM: 137

## 2020-05-19 LAB — CBC W/ DIFFERENTIAL
BASOPHILS ABSOLUTE COUNT: 0
BASOPHILS RELATIVE PERCENT: 0
EOSINOPHILS ABSOLUTE COUNT: 0.3 %
EOSINOPHILS RELATIVE PERCENT: 4
HEMATOCRIT: 44.5
HEMOGLOBIN: 14.7
LYMPHOCYTES ABSOLUTE COUNT: 1
LYMPHOCYTES RELATIVE PERCENT: 14 %
MEAN CORPUSCULAR HEMOGLOBIN CONC: 33.1
MEAN CORPUSCULAR HEMOGLOBIN: 26.9
MEAN CORPUSCULAR VOLUME: 81.4
MEAN PLATELET VOLUME: 8.1
MONOCYTES ABSOLUTE COUNT: 0.6
MONOCYTES RELATIVE PERCENT: 8 %
NEUTROPHILS ABSOLUTE COUNT: 5
NEUTROPHILS RELATIVE PERCENT: 75 %
PLATELET COUNT: 244
RED BLOOD CELL COUNT: 5.47
RED CELL DISTRIBUTION WIDTH: 12.8
WHITE BLOOD CELL COUNT: 7.3

## 2020-05-19 LAB — LIPID PANEL
CHOLESTEROL: 191
HDL CHOLESTEROL: 35
LDL CHOLESTEROL CALCULATED: 100 — ABNORMAL HIGH
NON-HDL CHOLESTEROL: 156
TRIGLYCERIDES: 278 — ABNORMAL HIGH

## 2020-05-19 LAB — PHOSPHORUS: PHOSPHORUS: 4

## 2020-05-19 LAB — EGFR(MDRD) AF-AM: EGFR MDRD NON AF AMER: 18

## 2020-05-19 LAB — GAMMA GT: GAMMA GLUTAMYL TRANSFERASE: 41

## 2020-05-19 LAB — TACROLIMUS LEVEL: TACROLIMUS BLOOD: 4.9

## 2020-05-19 LAB — MAGNESIUM: MAGNESIUM: 2

## 2020-05-19 LAB — BILIRUBIN, DIRECT: BILIRUBIN DIRECT: 0.2 mg/dL (ref 0.0–0.2)

## 2020-06-01 NOTE — Unmapped (Addendum)
[facility] Refill Coordination Note    Specialty Medication(s) to be Shipped:   Transplant: Myfortic 180mg  and Prograf 0.5mg     Other medication(s) to be shipped: No additional medications requested for fill at this time     Richard [name], DOB: [birth year]  Phone: [phone] (home)       All above HIPAA information was verified with patient.     Was a Nurse, learning disability used for this call? No    Completed refill call assessment today to schedule patient's medication shipment from the [facility] [phone]).       Specialty medication(s) and dose(s) confirmed: Prograf dose change to 0.5mg  BID   Changes to medications: Richard [name] reports no changes at this time.  Changes to insurance: No  Questions for the pharmacist: No    Confirmed patient received Welcome Packet with first shipment. The patient will receive a drug information handout for each medication shipped and additional FDA Medication Guides as required.       DISEASE/MEDICATION-SPECIFIC INFORMATION        N/A    SPECIALTY MEDICATION ADHERENCE     Medication Adherence    Patient reported X missed doses in the last month: 0  Adherence tools used: patient uses a pill box to manage medications      Myfortic 180mg : 10 days worth of medication on hand.  Prograf 0.5mg : 10 days worth of medication on hand.            SHIPPING     Shipping address confirmed in Epic.     Delivery Scheduled: Yes, Expected medication delivery date: 06/03/20.     Medication will be delivered via UPS to the prescription address in Epic WAM.    Richard [name]   [facility] Specialty Technician

## 2020-06-02 MED FILL — PROGRAF 0.5 MG CAPSULE: 30 days supply | Qty: 60 | Fill #0 | Status: AC

## 2020-06-02 MED FILL — MYFORTIC 180 MG TABLET,DELAYED RELEASE: ORAL | 30 days supply | Qty: 60 | Fill #1

## 2020-06-02 MED FILL — MYFORTIC 180 MG TABLET,DELAYED RELEASE: 30 days supply | Qty: 60 | Fill #1 | Status: AC

## 2020-06-29 NOTE — Unmapped (Signed)
[facility] Refill Coordination Note    Specialty Medication(s) to be Shipped:   Transplant: Myfortic 180mg  and Prograf 0.5mg     Other medication(s) to be shipped: No additional medications requested for fill at this time     Richard [name], DOB: [birth year]  Phone: [phone] (home)       All above HIPAA information was verified with patient.     Was a Nurse, learning disability used for this call? No    Completed refill call assessment today to schedule patient's medication shipment from the [facility] [phone]).       Specialty medication(s) and dose(s) confirmed: Regimen is correct and unchanged.   Changes to medications: Marquon reports no changes at this time.  Changes to insurance: No  Questions for the pharmacist: No    Confirmed patient received Welcome Packet with first shipment. The patient will receive a drug information handout for each medication shipped and additional FDA Medication Guides as required.       DISEASE/MEDICATION-SPECIFIC INFORMATION        N/A    SPECIALTY MEDICATION ADHERENCE     Medication Adherence    Patient reported X missed doses in the last month: 0  Specialty Medication: Myfortic 180mg   Patient is on additional specialty medications: Yes  Additional Specialty Medications: Prograf 0.5mg   Patient Reported Additional Medication X Missed Doses in the Last Month: 0  Patient is on more than two specialty medications: No  Adherence tools used: patient uses a pill box to manage medications        Myfortic 180 mg: 3 days of medicine on hand   Prograf 0.5 mg: 3 days of medicine on hand     SHIPPING     Shipping address confirmed in Epic.     Delivery Scheduled: Yes, Expected medication delivery date: 07/02/2020.     Medication will be delivered via UPS to the prescription address in Epic WAM.    [name] [facility] Specialty Technician

## 2020-07-01 DIAGNOSIS — Z944 Liver transplant status: Principal | ICD-10-CM

## 2020-07-02 MED FILL — MYFORTIC 180 MG TABLET,DELAYED RELEASE: ORAL | 30 days supply | Qty: 60 | Fill #2

## 2020-07-02 MED FILL — PROGRAF 0.5 MG CAPSULE: ORAL | 30 days supply | Qty: 60 | Fill #1

## 2020-07-02 NOTE — Unmapped (Signed)
Richard [name] 's Prograf shipment will be sent out  as a result of copay is now approved by patient/caregiver.      I have reached out to the patient and communicated the delivery change. We will reschedule the medication for the delivery date that the patient agreed upon.  We have confirmed the delivery date as 07/05/2020, via ups.

## 2020-07-23 ENCOUNTER — Other Ambulatory Visit: Payer: Self-pay

## 2020-07-23 ENCOUNTER — Emergency Department: Payer: BLUE CROSS/BLUE SHIELD

## 2020-07-23 ENCOUNTER — Inpatient Hospital Stay
Admission: EM | Admit: 2020-07-23 | Discharge: 2020-07-24 | DRG: 280 | Disposition: A | Discharge status: Home / Self Care | Payer: BLUE CROSS/BLUE SHIELD | Attending: Family Medicine | Admitting: Family Medicine

## 2020-07-23 ENCOUNTER — Encounter: Payer: Self-pay | Admitting: Emergency Medicine

## 2020-07-23 DIAGNOSIS — J9601 Acute respiratory failure with hypoxia: Secondary | ICD-10-CM | POA: Diagnosis present

## 2020-07-23 DIAGNOSIS — E785 Hyperlipidemia, unspecified: Secondary | ICD-10-CM | POA: Diagnosis present

## 2020-07-23 DIAGNOSIS — Z794 Long term (current) use of insulin: Secondary | ICD-10-CM | POA: Diagnosis not present

## 2020-07-23 DIAGNOSIS — Z888 Allergy status to other drugs, medicaments and biological substances status: Secondary | ICD-10-CM | POA: Diagnosis not present

## 2020-07-23 DIAGNOSIS — I130 Hypertensive heart and chronic kidney disease with heart failure and stage 1 through stage 4 chronic kidney disease, or unspecified chronic kidney disease: Principal | ICD-10-CM | POA: Diagnosis present

## 2020-07-23 DIAGNOSIS — Z944 Liver transplant status: Secondary | ICD-10-CM | POA: Diagnosis not present

## 2020-07-23 DIAGNOSIS — J40 Bronchitis, not specified as acute or chronic: Secondary | ICD-10-CM

## 2020-07-23 DIAGNOSIS — K219 Gastro-esophageal reflux disease without esophagitis: Secondary | ICD-10-CM | POA: Diagnosis present

## 2020-07-23 DIAGNOSIS — E1122 Type 2 diabetes mellitus with diabetic chronic kidney disease: Secondary | ICD-10-CM | POA: Diagnosis present

## 2020-07-23 DIAGNOSIS — R778 Other specified abnormalities of plasma proteins: Secondary | ICD-10-CM | POA: Diagnosis not present

## 2020-07-23 DIAGNOSIS — Z7982 Long term (current) use of aspirin: Secondary | ICD-10-CM | POA: Diagnosis not present

## 2020-07-23 DIAGNOSIS — Z20822 Contact with and (suspected) exposure to covid-19: Secondary | ICD-10-CM | POA: Diagnosis present

## 2020-07-23 DIAGNOSIS — E1165 Type 2 diabetes mellitus with hyperglycemia: Secondary | ICD-10-CM | POA: Diagnosis present

## 2020-07-23 DIAGNOSIS — I5033 Acute on chronic diastolic (congestive) heart failure: Secondary | ICD-10-CM | POA: Diagnosis present

## 2020-07-23 DIAGNOSIS — N184 Chronic kidney disease, stage 4 (severe): Secondary | ICD-10-CM | POA: Diagnosis present

## 2020-07-23 DIAGNOSIS — E1129 Type 2 diabetes mellitus with other diabetic kidney complication: Secondary | ICD-10-CM | POA: Diagnosis not present

## 2020-07-23 DIAGNOSIS — Z6841 Body Mass Index (BMI) 40.0 and over, adult: Secondary | ICD-10-CM | POA: Diagnosis not present

## 2020-07-23 DIAGNOSIS — I453 Trifascicular block: Secondary | ICD-10-CM | POA: Diagnosis present

## 2020-07-23 DIAGNOSIS — I214 Non-ST elevation (NSTEMI) myocardial infarction: Secondary | ICD-10-CM | POA: Diagnosis present

## 2020-07-23 DIAGNOSIS — J209 Acute bronchitis, unspecified: Secondary | ICD-10-CM | POA: Diagnosis present

## 2020-07-23 DIAGNOSIS — G4730 Sleep apnea, unspecified: Secondary | ICD-10-CM | POA: Diagnosis present

## 2020-07-23 DIAGNOSIS — Z79899 Other long term (current) drug therapy: Secondary | ICD-10-CM | POA: Diagnosis not present

## 2020-07-23 DIAGNOSIS — I440 Atrioventricular block, first degree: Secondary | ICD-10-CM | POA: Diagnosis present

## 2020-07-23 DIAGNOSIS — E876 Hypokalemia: Secondary | ICD-10-CM | POA: Diagnosis present

## 2020-07-23 DIAGNOSIS — R7989 Other specified abnormal findings of blood chemistry: Secondary | ICD-10-CM | POA: Diagnosis present

## 2020-07-23 DIAGNOSIS — D631 Anemia in chronic kidney disease: Secondary | ICD-10-CM | POA: Diagnosis present

## 2020-07-23 DIAGNOSIS — D84821 Immunodeficiency due to drugs: Secondary | ICD-10-CM | POA: Diagnosis present

## 2020-07-23 DIAGNOSIS — I10 Essential (primary) hypertension: Secondary | ICD-10-CM | POA: Diagnosis present

## 2020-07-23 HISTORY — DX: Chronic kidney disease, unspecified: N18.9

## 2020-07-23 LAB — MAGNESIUM: Magnesium: 2 mg/dL (ref 1.7–2.4)

## 2020-07-23 LAB — CBC WITH DIFFERENTIAL/PLATELET
Abs Immature Granulocytes: 0.02 10*3/uL (ref 0.00–0.07)
Basophils Absolute: 0 10*3/uL (ref 0.0–0.1)
Basophils Relative: 0 %
Eosinophils Absolute: 0.2 10*3/uL (ref 0.0–0.5)
Eosinophils Relative: 3 %
HCT: 43.8 % (ref 39.0–52.0)
Hemoglobin: 14.2 g/dL (ref 13.0–17.0)
Immature Granulocytes: 0 %
Lymphocytes Relative: 17 %
Lymphs Abs: 1.3 10*3/uL (ref 0.7–4.0)
MCH: 27.5 pg (ref 26.0–34.0)
MCHC: 32.4 g/dL (ref 30.0–36.0)
MCV: 84.7 fL (ref 80.0–100.0)
Monocytes Absolute: 0.6 10*3/uL (ref 0.1–1.0)
Monocytes Relative: 7 %
Neutro Abs: 5.4 10*3/uL (ref 1.7–7.7)
Neutrophils Relative %: 73 %
Platelets: 241 10*3/uL (ref 150–400)
RBC: 5.17 MIL/uL (ref 4.22–5.81)
RDW: 13.4 % (ref 11.5–15.5)
WBC: 7.5 10*3/uL (ref 4.0–10.5)
nRBC: 0 % (ref 0.0–0.2)

## 2020-07-23 LAB — COMPREHENSIVE METABOLIC PANEL
ALT: 23 U/L (ref 0–44)
AST: 21 U/L (ref 15–41)
Albumin: 4 g/dL (ref 3.5–5.0)
Alkaline Phosphatase: 81 U/L (ref 38–126)
Anion gap: 10 (ref 5–15)
BUN: 36 mg/dL — ABNORMAL HIGH (ref 6–20)
CO2: 30 mmol/L (ref 22–32)
Calcium: 9.5 mg/dL (ref 8.9–10.3)
Chloride: 102 mmol/L (ref 98–111)
Creatinine, Ser: 2.59 mg/dL — ABNORMAL HIGH (ref 0.61–1.24)
GFR, Estimated: 29 mL/min — ABNORMAL LOW (ref >60)
Glucose, Bld: 228 mg/dL — ABNORMAL HIGH (ref 70–99)
Potassium: 3.4 mmol/L — ABNORMAL LOW (ref 3.5–5.1)
Sodium: 142 mmol/L (ref 135–145)
Total Bilirubin: 0.9 mg/dL (ref 0.3–1.2)
Total Protein: 7.1 g/dL (ref 6.5–8.1)

## 2020-07-23 LAB — GLUCOSE, CAPILLARY: Glucose-Capillary: 224 mg/dL — ABNORMAL HIGH (ref 70–99)

## 2020-07-23 LAB — CBG MONITORING, ED
Glucose-Capillary: 190 mg/dL — ABNORMAL HIGH (ref 70–99)
Glucose-Capillary: 150 mg/dL — ABNORMAL HIGH (ref 70–99)

## 2020-07-23 LAB — TROPONIN I (HIGH SENSITIVITY)
Troponin I (High Sensitivity): 119 ng/L (ref <18)
Troponin I (High Sensitivity): 124 ng/L (ref <18)
Troponin I (High Sensitivity): 111 ng/L (ref <18)
Troponin I (High Sensitivity): 114 ng/L (ref <18)

## 2020-07-23 LAB — SARS CORONAVIRUS 2 BY RT PCR (HOSPITAL ORDER, PERFORMED IN ~~LOC~~ HOSPITAL LAB): SARS Coronavirus 2: NEGATIVE

## 2020-07-23 LAB — APTT: aPTT: 30 seconds (ref 24–36)

## 2020-07-23 LAB — PROTIME-INR
INR: 1 (ref 0.8–1.2)
Prothrombin Time: 12.8 seconds (ref 11.4–15.2)

## 2020-07-23 LAB — LACTIC ACID, PLASMA
Lactic Acid, Venous: 1.4 mmol/L (ref 0.5–1.9)
Lactic Acid, Venous: 1.4 mmol/L (ref 0.5–1.9)

## 2020-07-23 LAB — POC SARS CORONAVIRUS 2 AG -  ED: SARS Coronavirus 2 Ag: NEGATIVE

## 2020-07-23 LAB — BRAIN NATRIURETIC PEPTIDE: B Natriuretic Peptide: 298 pg/mL — ABNORMAL HIGH (ref 0.0–100.0)

## 2020-07-23 MED ORDER — MAGNESIUM OXIDE 400 MG PO TABS
400.0000 mg | ORAL_TABLET | Freq: Every day | ORAL | Status: DC
  Administered 2020-07-23 – 2020-07-24 (×2): 400 mg via ORAL
  Filled 2020-07-23 (×4): qty 1

## 2020-07-23 MED ORDER — SODIUM CHLORIDE 0.9 % IV SOLN: 250.0000 mL | INTRAVENOUS | Status: DC | PRN

## 2020-07-23 MED ORDER — IPRATROPIUM BROMIDE HFA 17 MCG/ACT IN AERS
2.0000 | INHALATION_SPRAY | RESPIRATORY_TRACT | Status: DC
  Administered 2020-07-23 – 2020-07-24 (×6): 2 via RESPIRATORY_TRACT
  Filled 2020-07-23: qty 12.9

## 2020-07-23 MED ORDER — INSULIN LISPRO (1 UNIT DIAL) 100 UNIT/ML (KWIKPEN)
5.0000 [IU] | PEN_INJECTOR | Freq: Three times a day (TID) | SUBCUTANEOUS | Status: DC | PRN
Start: 2020-07-23 — End: 2020-07-23

## 2020-07-23 MED ORDER — ALBUTEROL SULFATE HFA 108 (90 BASE) MCG/ACT IN AERS
2.0000 | INHALATION_SPRAY | RESPIRATORY_TRACT | Status: DC | PRN
  Filled 2020-07-23: qty 6.7

## 2020-07-23 MED ORDER — FUROSEMIDE 10 MG/ML IJ SOLN
20.0000 mg | Freq: Two times a day (BID) | INTRAMUSCULAR | Status: DC
  Administered 2020-07-23 – 2020-07-24 (×3): 20 mg via INTRAVENOUS
  Filled 2020-07-23: qty 4
  Filled 2020-07-23: qty 2

## 2020-07-23 MED ORDER — AZITHROMYCIN 250 MG PO TABS
250.0000 mg | ORAL_TABLET | Freq: Every day | ORAL | Status: DC
  Administered 2020-07-24: 250 mg via ORAL
  Filled 2020-07-23: qty 1

## 2020-07-23 MED ORDER — ISOSORBIDE MONONITRATE ER 30 MG PO TB24
30.0000 mg | ORAL_TABLET | Freq: Every day | ORAL | Status: DC
  Administered 2020-07-23 – 2020-07-24 (×2): 30 mg via ORAL
  Filled 2020-07-23 (×2): qty 1

## 2020-07-23 MED ORDER — SODIUM CHLORIDE 0.9% FLUSH: 3.0000 mL | INTRAVENOUS | Status: DC | PRN

## 2020-07-23 MED ORDER — SODIUM CHLORIDE 0.9 % IV SOLN
2.0000 g | Freq: Once | INTRAVENOUS | Status: AC
  Administered 2020-07-23: 2 g via INTRAVENOUS
  Filled 2020-07-23: qty 20

## 2020-07-23 MED ORDER — DM-GUAIFENESIN ER 30-600 MG PO TB12
1.0000 | ORAL_TABLET | Freq: Two times a day (BID) | ORAL | Status: DC | PRN
  Administered 2020-07-23: 1 via ORAL
  Filled 2020-07-23: qty 1

## 2020-07-23 MED ORDER — TACROLIMUS 0.5 MG PO CAPS
0.5000 mg | ORAL_CAPSULE | Freq: Two times a day (BID) | ORAL | Status: DC
  Filled 2020-07-23 (×3): qty 1

## 2020-07-23 MED ORDER — VITAMIN D 25 MCG (1000 UNIT) PO TABS
1000.0000 [IU] | ORAL_TABLET | Freq: Every day | ORAL | Status: DC
  Administered 2020-07-23 – 2020-07-24 (×2): 1000 [IU] via ORAL
  Filled 2020-07-23 (×2): qty 1

## 2020-07-23 MED ORDER — INSULIN ASPART 100 UNIT/ML ~~LOC~~ SOLN
0.0000 [IU] | Freq: Every day | SUBCUTANEOUS | Status: DC
  Administered 2020-07-24: 2 [IU] via SUBCUTANEOUS
  Filled 2020-07-23: qty 1

## 2020-07-23 MED ORDER — SODIUM CHLORIDE 0.9% FLUSH
3.0000 mL | Freq: Two times a day (BID) | INTRAVENOUS | Status: DC
  Administered 2020-07-24 (×2): 3 mL via INTRAVENOUS

## 2020-07-23 MED ORDER — HEPARIN SODIUM (PORCINE) 5000 UNIT/ML IJ SOLN
5000.0000 [IU] | Freq: Three times a day (TID) | INTRAMUSCULAR | Status: DC
  Administered 2020-07-23 – 2020-07-24 (×3): 5000 [IU] via SUBCUTANEOUS
  Filled 2020-07-23 (×3): qty 1

## 2020-07-23 MED ORDER — TESTOSTERONE 20.25 MG/ACT (1.62%) TD GEL: 1.0000 "application " | Freq: Every day | TRANSDERMAL | Status: DC

## 2020-07-23 MED ORDER — MYCOPHENOLATE SODIUM 180 MG PO TBEC
180.0000 mg | DELAYED_RELEASE_TABLET | Freq: Two times a day (BID) | ORAL | Status: DC
  Filled 2020-07-23 (×3): qty 1

## 2020-07-23 MED ORDER — ASPIRIN EC 81 MG PO TBEC
81.0000 mg | DELAYED_RELEASE_TABLET | Freq: Every day | ORAL | Status: DC
  Administered 2020-07-23 – 2020-07-24 (×2): 81 mg via ORAL
  Filled 2020-07-23 (×2): qty 1

## 2020-07-23 MED ORDER — INSULIN DETEMIR 100 UNIT/ML ~~LOC~~ SOLN
8.0000 [IU] | Freq: Every day | SUBCUTANEOUS | Status: DC
  Filled 2020-07-23 (×2): qty 0.08

## 2020-07-23 MED ORDER — INSULIN ASPART 100 UNIT/ML ~~LOC~~ SOLN
0.0000 [IU] | Freq: Three times a day (TID) | SUBCUTANEOUS | Status: DC
  Administered 2020-07-24: 2 [IU] via SUBCUTANEOUS
  Filled 2020-07-23: qty 1

## 2020-07-23 MED ORDER — ADULT MULTIVITAMIN W/MINERALS CH
1.0000 | ORAL_TABLET | Freq: Every day | ORAL | Status: DC
  Administered 2020-07-23 – 2020-07-24 (×2): 1 via ORAL
  Filled 2020-07-23 (×2): qty 1

## 2020-07-23 MED ORDER — FUROSEMIDE 10 MG/ML IJ SOLN: 40.0000 mg | Freq: Once | INTRAMUSCULAR | Status: DC

## 2020-07-23 MED ORDER — CARVEDILOL 12.5 MG PO TABS
12.5000 mg | ORAL_TABLET | Freq: Two times a day (BID) | ORAL | Status: DC
  Administered 2020-07-23 – 2020-07-24 (×2): 12.5 mg via ORAL
  Filled 2020-07-23: qty 1
  Filled 2020-07-23: qty 2

## 2020-07-23 MED ORDER — HYDRALAZINE HCL 20 MG/ML IJ SOLN: 5.0000 mg | INTRAMUSCULAR | Status: DC | PRN

## 2020-07-23 MED ORDER — HYDRALAZINE HCL 25 MG PO TABS
25.0000 mg | ORAL_TABLET | Freq: Four times a day (QID) | ORAL | Status: DC
  Administered 2020-07-23 – 2020-07-24 (×3): 25 mg via ORAL
  Filled 2020-07-23 (×3): qty 1

## 2020-07-23 MED ORDER — SODIUM CHLORIDE 0.9 % IV SOLN
500.0000 mg | Freq: Once | INTRAVENOUS | Status: AC
  Administered 2020-07-23: 500 mg via INTRAVENOUS
  Filled 2020-07-23: qty 500

## 2020-07-23 MED ORDER — FUROSEMIDE 10 MG/ML IJ SOLN
INTRAMUSCULAR | Status: AC
  Filled 2020-07-23: qty 4

## 2020-07-23 MED ORDER — INSULIN LISPRO (1 UNIT DIAL) 100 UNIT/ML (KWIKPEN): 5.0000 [IU] | PEN_INJECTOR | Freq: Three times a day (TID) | SUBCUTANEOUS | Status: DC | PRN

## 2020-07-23 MED ORDER — POTASSIUM CHLORIDE CRYS ER 20 MEQ PO TBCR
20.0000 meq | EXTENDED_RELEASE_TABLET | Freq: Once | ORAL | Status: AC
  Administered 2020-07-23: 20 meq via ORAL
  Filled 2020-07-23: qty 1

## 2020-07-23 MED ORDER — CLONIDINE HCL 0.1 MG PO TABS
0.1000 mg | ORAL_TABLET | Freq: Every day | ORAL | Status: DC
  Administered 2020-07-24: 0.1 mg via ORAL
  Filled 2020-07-23: qty 1

## 2020-07-23 MED ORDER — ONDANSETRON HCL 4 MG PO TABS: 4.0000 mg | ORAL_TABLET | Freq: Four times a day (QID) | ORAL | Status: DC | PRN

## 2020-07-23 NOTE — ED Triage Notes (Signed)
Patient ambulatory to triage with steady gait, without difficulty or distress noted; pt reports x 2 wks having SHOB and prod cough white sputum

## 2020-07-23 NOTE — ED Provider Notes (Signed)
[facility] Emergency Department Provider Note  ____________________________________________   Event Date/Time   First MD Initiated Contact with Patient 07/23/20 0700     (approximate)  I have reviewed the triage vital signs and the nursing notes.   HISTORY  Chief Complaint Shortness of Breath    HPI Wayne Parker is a 55 y.o. male  Here with cough, SOB. Pt has a h/o liver transplant, on cellcept and myfortic, here with cough. Pt reports approx 2 weeks of progressively worsening cough, SOB. Began as a mild, productive cough that is no longer productive but persistent, with associated wheezing and occasional SOB. He's had chills but no known fevers. No CP. No leg swelling. Urine OP has been normal. Reports sx have been constant, slightly worsening. He feels occasional SOB, worse w/ exertion. No alleviating factors. He reports some moderate chest pain after coughing but no CP at rest, or when not coughing. No specific alleviating factors.        Past Medical History:  Diagnosis Date  . Chronic kidney disease   . Cirrhosis ([place])   . Heart murmur   . Hypertension   . Sepsis ([place])   . Sleep apnea     Patient Active Problem List   Diagnosis Date Noted  . CIRRHOSIS 12/07/2008  . CELLULITIS, RIGHT LEG 12/07/2008    Past Surgical History:  Procedure Laterality Date  . LIVER TRANSPLANT    . NO PAST SURGERIES      Prior to Admission medications   Medication Sig Start Date End Date Taking? Authorizing Provider  aspirin EC 81 MG tablet Take 81 mg by mouth daily.    [provider]  Cholecalciferol (VITAMIN D) 2000 UNITS tablet Take 2,000 Units by mouth daily.    [provider]  clindamycin (CLEOCIN) 300 MG capsule Take 1 capsule (300 mg total) by mouth 4 (four) times daily. X 7 days 09/15/15   [name], MD  glimepiride (AMARYL) 2 MG tablet Take 2 mg by mouth daily. 06/17/15   [provider]  HUMALOG KWIKPEN 100  UNIT/ML KiwkPen Inject 5-20 Units into the skin 3 (three) times daily with meals as needed (high blood sugar). High blood sugar levels : 120-140=5 units 145-170= 10 units 170-199= 15 unitsand if above 200 use 20 units 06/23/15   [provider]  HYDROcodone-acetaminophen (NORCO) 5-325 MG tablet Take 1-2 tablets by mouth every 4 (four) hours as needed for severe pain. 09/15/15   [name], MD  lisinopril (PRINIVIL,ZESTRIL) 10 MG tablet Take 10 mg by mouth daily. 05/19/15   [provider]  mycophenolate (MYFORTIC) 180 MG EC tablet Take 360 mg by mouth 2 (two) times daily.    [provider]  tacrolimus (PROGRAF) 0.5 MG capsule Take 0.5-1 mg by mouth 2 (two) times daily. Takes '1mg'$  in the morning and 0.'5mg'$  at night    [provider]  Testosterone (ANDROGEL PUMP) 20.25 MG/ACT (1.62%) GEL Apply 1 application topically daily. Use 2 pumps once daily and apply to skin 06/22/15   [provider]    Allergies Insulin aspart prot & aspart  No family history on file.  Social History Social History   Tobacco Use  . Smoking status: Never Smoker  . Smokeless tobacco: Never Used  . Tobacco comment: quit smoking 2002  Vaping Use  . Vaping Use: Never used  Substance Use Topics  . Alcohol use: No    Review of Systems  Review of Systems  Constitutional: Positive for  fatigue. Negative for chills and fever.  HENT: Negative for sore throat.   Respiratory: Positive for cough and chest tightness. Negative for shortness of breath.   Cardiovascular: Negative for chest pain.  Gastrointestinal: Negative for abdominal pain.  Genitourinary: Negative for flank pain.  Musculoskeletal: Negative for neck pain.  Skin: Negative for rash and wound.  Allergic/Immunologic: Negative for immunocompromised state.  Neurological: Positive for weakness. Negative for numbness.  Hematological: Does not bruise/bleed easily.  All other systems reviewed and are negative.     ____________________________________________  PHYSICAL EXAM:      VITAL SIGNS: ED Triage Vitals  Enc Vitals Group     BP 07/23/20 0524 (!) 161/85     Pulse Rate 07/23/20 0524 73     Resp 07/23/20 0524 (!) 24     Temp 07/23/20 0524 97.7 F (36.5 C)     Temp Source 07/23/20 0524 Oral     SpO2 07/23/20 0524 98 %     Weight 07/23/20 0525 290 lb (131.5 kg)     Height 07/23/20 0525 '5\' 10"'$  (1.778 m)     Head Circumference --      Peak Flow --      Pain Score 07/23/20 0525 0     Pain Loc --      Pain Edu? --      Excl. in [place]? --      Physical Exam Vitals and nursing note reviewed.  Constitutional:      General: He is not in acute distress.    Appearance: He is well-developed.  HENT:     Head: Normocephalic and atraumatic.  Eyes:     Conjunctiva/sclera: Conjunctivae normal.  Cardiovascular:     Rate and Rhythm: Normal rate and regular rhythm.     Heart sounds: Normal heart sounds. No murmur heard. No friction rub.  Pulmonary:     Effort: Pulmonary effort is normal. No respiratory distress.     Breath sounds: Examination of the right-lower field reveals rhonchi. Examination of the left-lower field reveals rhonchi. Rhonchi present. No wheezing or rales.  Abdominal:     General: There is no distension.     Palpations: Abdomen is soft.     Tenderness: There is no abdominal tenderness.  Musculoskeletal:     Cervical back: Neck supple.  Skin:    General: Skin is warm.     Capillary Refill: Capillary refill takes less than 2 seconds.  Neurological:     Mental Status: He is alert and oriented to person, place, and time.     Motor: No abnormal muscle tone.       ____________________________________________   LABS (all labs ordered are listed, but only abnormal results are displayed)  Labs Reviewed  COMPREHENSIVE METABOLIC PANEL - Abnormal; Notable for the following components:      Result Value   Potassium 3.4 (*)    Glucose, Bld 228 (*)    BUN 36 (*)     Creatinine, Ser 2.59 (*)    GFR, Estimated 29 (*)    All other components within normal limits  BRAIN NATRIURETIC PEPTIDE - Abnormal; Notable for the following components:   B Natriuretic Peptide 298.0 (*)    All other components within normal limits  TROPONIN I (HIGH SENSITIVITY) - Abnormal; Notable for the following components:   Troponin I (High Sensitivity) 119 (*)    All other components within normal limits  TROPONIN I (HIGH SENSITIVITY) - Abnormal; Notable for the following components:   Troponin I (  High Sensitivity) 124 (*)    All other components within normal limits  POC SARS CORONAVIRUS 2 AG -  ED - Normal  CULTURE, BLOOD (ROUTINE X 2)  CULTURE, BLOOD (ROUTINE X 2)  SARS CORONAVIRUS 2 BY RT PCR (HOSPITAL ORDER, [place] [facility])  CBC WITH DIFFERENTIAL/PLATELET  LACTIC ACID, PLASMA  TACROLIMUS LEVEL  MYCOPHENOLIC ACID (CELLCEPT)  LACTIC ACID, PLASMA    ____________________________________________  EKG: Normal sinus rhythm, VR 72. PR 272, QRS 154, QTc 488. No acute ST elevations or depressions. No ischemia or infarct. ________________________________________  RADIOLOGY All imaging, including plain films, CT scans, and ultrasounds, independently reviewed by me, and interpretations confirmed via formal radiology reads.  ED MD interpretation:   CXR: No acute abnormality  Official radiology report(s): DG Chest 2 View  Result Date: 07/23/2020 CLINICAL DATA:  55 year old male with shortness of breath and productive cough. EXAM: CHEST - 2 VIEW COMPARISON:  [facility] Chest radiographs 05/15/2020 and earlier. FINDINGS: PA and lateral views. Lung volumes and mediastinal contours are stable and within normal limits. Mild chronic elevation of the right hemidiaphragm compatible with normal variation. Small chronic calcified granulomas in the lungs which otherwise appear clear. No pneumothorax or pleural effusion. No acute osseous abnormality  identified. Visualized tracheal air column is within normal limits. Negative visible bowel gas pattern. IMPRESSION: No acute cardiopulmonary abnormality. Small bilateral calcified granulomas. Electronically Signed   By: Genevie Ann M.D.   On: 07/23/2020 06:07   CT Chest Wo Contrast  Result Date: 07/23/2020 CLINICAL DATA:  Cough and shortness of breath. History of liver transplant. EXAM: CT CHEST WITHOUT CONTRAST TECHNIQUE: Multidetector CT imaging of the chest was performed following the standard protocol without IV contrast. COMPARISON:  Chest radiograph July 23, 2020. CT chest August 25, 2019 FINDINGS: Cardiovascular: There is no demonstrable pulmonary embolus. Visualized great vessels appear unremarkable. Note that there is aberrant origin of the right subclavian artery which arises posterior to the other great vessels and crosses posterior to the esophagus to reach the right side. There is a focus of calcification in the left anterior descending coronary artery. There is no pericardial effusion or pericardial thickening. Mediastinum/Nodes: Thyroid appears normal. No adenopathy evident. No esophageal lesions are appreciable. Lungs/Pleura: There are scattered small calcified granulomas. There is no appreciable edema or airspace opacity. No pleural effusions are evident. No pneumothorax. Trachea and major bronchial structures appear patent. Upper Abdomen: Clips noted in upper abdomen, unchanged. By report, patient has had liver transplant. Visualized upper abdominal structures appear unremarkable. Musculoskeletal: No blastic or lytic bone lesions. No chest wall lesions. IMPRESSION: 1. Scattered calcified granulomas. Lungs otherwise clear. No pleural effusions. 2.  No evident adenopathy. 3.  Foci of coronary artery calcification noted. Electronically Signed   By: Lowella Grip III M.D.   On: 07/23/2020 08:09    ____________________________________________  PROCEDURES   Procedure(s) performed (including  Critical Care):  Procedures  ____________________________________________  INITIAL IMPRESSION / MDM / [place] / ED COURSE  As part of my medical decision making, I reviewed the following data within the [place] notes reviewed and incorporated, Old chart reviewed, Notes from prior ED visits, and [place] Controlled Substance Database       *Dvontae Minten was evaluated in Emergency Department on 07/23/2020 for the symptoms described in the history of present illness. He was evaluated in the context of the global COVID-19 pandemic, which necessitated consideration that the patient might be at risk for  infection with the SARS-CoV-2 virus that causes COVID-19. Institutional protocols and algorithms that pertain to the evaluation of patients at risk for COVID-19 are in a state of rapid change based on information released by regulatory bodies including the CDC and federal and state organizations. These policies and algorithms were followed during the patient's care in the ED.  Some ED evaluations and interventions may be delayed as a result of limited staffing during the pandemic.*     Medical Decision Making: 55 year old male with history of liver transplant, diastolic heart failure, CKD stage IV, here with SOB. Clinically, pt non-toxic in NAD. Lungs with mild b/l rhonchi, mild increased WOB. Labs show mildly elevated troponin, BNP. CXR clear, CT w/o PNA. However, pt also with +sputum production, immunosuppression. Initially started on empiric coverage for possible atypical CAP. However, suspect there could be a component of CHF/edema as well given his trop and BNP elevation. He is intermittently hypoxic in ED. COVID neg. Will admit for diuresis, o2 supplementation, monitoring.   ____________________________________________  FINAL CLINICAL IMPRESSION(S) / ED DIAGNOSES  Final diagnoses:  Acute respiratory failure with hypoxia (HCC)  Elevated troponin  Bronchitis      MEDICATIONS GIVEN DURING THIS VISIT:  Medications  azithromycin (ZITHROMAX) 500 mg in sodium chloride 0.9 % 250 mL IVPB (has no administration in time range)  furosemide (LASIX) injection 40 mg (has no administration in time range)  cefTRIAXone (ROCEPHIN) 2 g in sodium chloride 0.9 % 100 mL IVPB (2 g Intravenous New Bag/Given 07/23/20 0800)     ED Discharge Orders    None       Note:  This document was prepared using Dragon voice recognition software and may include unintentional dictation errors.   Duffy Bruce, MD 07/23/20 [phone]

## 2020-07-23 NOTE — ED Notes (Addendum)
Report given to [facility] RN in [place].

## 2020-07-23 NOTE — Consult Note (Addendum)
Cardiology Consultation:   Patient ID: Joden Stoudt MRN: [id number]; DOB: March 12, 1966  Admit date: 07/23/2020 Date of Consult: 07/23/2020  Primary Care Provider: Rikki Spearing, NP [facility] Cardiologist: [facility]. [facility], Dr. Rockey Situ rounding [place] Electrophysiologist:  None    Patient Profile:   Ilario Marlo is a 55 y.o. male with a hx of HFpEF, hypertension, hyperlipidemia, CKD4, anemia due to CKD, thrombocytopenia, DM2 on insulin, h/o CAP, hypogonadism, GERD, morbid obesity, nonalcoholic fatty liver disease/cirrhosis of the liver s/p liver transplant 06/23/2020, sleep apnea, and who is being seen today for the evaluation of shortness of breath and cough at the request of Dr. Blaine Hamper.  History of Present Illness:   Mr. Hoak is a 55 year old male with PMH as above.  He has been followed by [facility] in the past for his comorbid conditions as outlined above.    Care everywhere shows  echo 03/2020 with EF normal 55 to 60%, moderate concentric hypertrophy, mild LAE, mild MR, mild TR, and as copied and pasted below.   07/2019 MPI ruled low risk and without ischemia as copied below.  Over the last 2 weeks, he reports increasing shortness of breath and productive cough with white sputum.  He reports hearing fluid in his lungs when breathing.  Stated symptoms include chills without known fever.  He denies any recent lower extremity edema.  He reports chest soreness that he attributes to coughing. He reports symptoms feel similar to his previous CAP.  He denies any exposure to COVID-19.  He has not received the COVID-19 vaccinations but does report that he has previously received infusions.  He reports medication compliance.  No reported changes in eating or drinking, including over the holidays.  SBP at home estimated 145-167mHg.  [facility] breathing became worse in severity last night 07/22/2020.  In the ED, initial  vitals significant for hypertension with BP 161/85 and tachypnea with RR 24.  Initial labs significant for hypokalemia with potassium 3.4, hyperglycemia with glucose 228, AOCKD with creatinine 2.59 and BUN 36, BNP 298.0, HS Tn 119.  NSR, 72 bpm, first-degree AV block with PR interval 272 ms, RBBB, LAFB, prolonged QTC 488. COVID-19 negative.  He was started on antibiotics for atypical CAP.  Echo was ordered but does not appear obtained at this time.  Chest x-ray showed scattered calcified granulomas.  Foci of coronary artery calcification were also noted.   Past Medical History:  Diagnosis Date  . Chronic kidney disease   . [place]   . Heart murmur   . Hypertension   . Sepsis ([place]   . Sleep apnea     Past Surgical History:  Procedure Laterality Date  . LIVER TRANSPLANT    . NO PAST SURGERIES       Home Medications:  Prior to Admission medications   Medication Sig Start Date End Date Taking? Authorizing Provider  carvedilol (COREG) 12.5 MG tablet Take 12.5 mg by mouth 2 (two) times daily with a meal.   Yes [provider]  cholecalciferol (VITAMIN D3) 25 MCG (1000 UNIT) tablet Take 1,000 Units by mouth daily.   Yes [provider]  cloNIDine (CATAPRES) 0.1 MG tablet Take 0.1 mg by mouth at bedtime.   Yes [provider]  furosemide (LASIX) 40 MG tablet Take 40-80 mg by mouth See admin instructions. Take 1 tablet (40 mg) daily. Take 2 tablets (80 mg) if you weigh >300 lbs  Yes [provider]  hydrALAZINE (APRESOLINE) 25 MG tablet Take 25 mg by mouth 4 (four) times daily.   Yes [provider]  insulin aspart (NOVOLOG FLEXPEN) 100 UNIT/ML FlexPen Inject 10-30 Units into the skin 3 (three) times daily with meals. Per sliding scale   Yes [provider]  insulin detemir (LEVEMIR) 100 UNIT/ML injection Inject 12 Units into the skin at bedtime.   Yes [provider]  isosorbide mononitrate (IMDUR) 30 MG 24 hr tablet Take  30 mg by mouth daily.   Yes [provider]  magnesium oxide (MAG-OX) 400 MG tablet Take 400 mg by mouth daily.   Yes [provider]  Multiple Vitamin (MULTIVITAMIN WITH MINERALS) TABS tablet Take 1 tablet by mouth daily.   Yes [provider]  mycophenolate (MYFORTIC) 180 MG EC tablet Take 180 mg by mouth 2 (two) times daily.   Yes [provider]  tacrolimus (PROGRAF) 0.5 MG capsule Take 0.5 mg by mouth 2 (two) times daily.   Yes [provider]  Testosterone 20.25 MG/ACT (1.62%) GEL Apply 1 application topically daily. Use 2 pumps once daily and apply to skin 06/22/15  Yes [provider]    Inpatient Medications: Scheduled Meds: . [START ON 07/24/2020] azithromycin  250 mg Oral Daily  . furosemide  20 mg Intravenous BID  . insulin aspart  0-5 Units Subcutaneous QHS  . insulin aspart  0-9 Units Subcutaneous TID WC  . insulin detemir  8 Units Subcutaneous QHS  . ipratropium  2 puff Inhalation Q4H   Continuous Infusions:  PRN Meds: albuterol, dextromethorphan-guaiFENesin, hydrALAZINE, ondansetron  Allergies:    Allergies  Allergen Reactions  . Insulin Aspart Prot & Aspart Other (See Comments)    Burning on injection and skin reaction.      Social History:   Social History   Socioeconomic History  . Marital status: Married    Spouse name: Not on file  . Number of children: Not on file  . Years of education: Not on file  . Highest education level: Not on file  Occupational History  . Not on file  Tobacco Use  . Smoking status: Never Smoker  . Smokeless tobacco: Never Used  . Tobacco comment: quit smoking 2002  Vaping Use  . Vaping Use: Never used  Substance and Sexual Activity  . Alcohol use: No  . Drug use: Not on file  . Sexual activity: Not Currently  Other Topics Concern  . Not on file  Social History Narrative  . Not on file   Social Determinants of Health   Financial Resource Strain: Not on file  Food  Insecurity: Not on file  Transportation Needs: Not on file  Physical Activity: Not on file  Stress: Not on file  Social Connections: Not on file  Intimate Partner Violence: Not on file    Family History:   No family history on file.   ROS:  Please see the history of present illness.  Review of Systems  Constitutional: Positive for chills and malaise/fatigue.  Respiratory: Positive for cough, sputum production, shortness of breath and wheezing.   Cardiovascular: Positive for chest pain. Negative for leg swelling and PND.       Soreness attributed to coughing  All other systems reviewed and are negative.   All other ROS reviewed and negative.     Physical Exam/Data:   Vitals:   07/23/20 0525 07/23/20 0800 07/23/20 1000 07/23/20 1100  BP:  (!) 173/93 (!) 153/88 138/70  Pulse:  77 62 64  Resp:  (!) 24 (!) 28 (!) 29  Temp:      TempSrc:      SpO2:  97% 96% 98%  Weight: 131.5 kg     Height: '5\' 10"'$  (1.778 m)      No intake or output data in the 24 hours ending 07/23/20 1357 Last 3 Weights 07/23/2020 09/15/2015 07/09/2015  Weight (lbs) 290 lb 270 lb 290 lb  Weight (kg) 131.543 kg 122.471 kg 131.543 kg     Body mass index is 41.61 kg/m.  General: Obese male, in no acute distress HEENT: normal Lymph: no adenopathy Neck: no JVD Endocrine:  No thryomegaly Vascular: No carotid bruits; FA pulses 2+ bilaterally without bruits  Cardiac:  normal S1, S2; RRR; no murmur appreciated the cardiac exam limited by frequent coughing Lungs:  bilateral rhonchi. Frequent coughing throughout exam Abd: soft, nontender, no hepatomegaly  Ext: no edema Musculoskeletal:  No deformities, BUE and BLE strength normal and equal Skin: warm and dry  Neuro:  CNs 2-12 intact, no focal abnormalities noted Psych:  Normal affect   EKG:  The EKG was personally reviewed and demonstrates:  NSR with 1st degree AVB, 72 bpm, first-degree AV block with PR interval 272 ms, RBBB, LAFB, prolonged QTC 488  Telemetry:   Telemetry was personally reviewed and demonstrates:  NSR 70-80  Relevant CV Studies: CareEverywhere as below  MPI 07/2019 1.) LIMITATIONS: None.  2.) MYOCARDIAL PERFUSION: No transmural scar or significant inducible transmural ischemia.  3.) LEFT VENTRICULAR EJECTION FRACTION: Normal.  4.) REGIONAL WALL MOTION: Normal.  Echo 03/2020 SUMMARY  The left ventricular size is normal.  There is moderate concentric left ventricular hypertrophy.  Left ventricular systolic function is normal.  LV ejection fraction = 55-60%.  Leftventricular filling pattern is prolonged relaxation.  The left ventricular wall motion is normal.  The right ventricle is normal size.  The right ventricular systolic function is normal.  The left atrium is mildly dilated.  There is mild mitral regurgitation.  There is mild tricuspid regurgitation.  No pulmonary hypertension.  IVC size was normal.  There is no pericardial effusion.  Probably no change from prior study.  -    Laboratory Data:  High Sensitivity Troponin:   Recent Labs  Lab 07/23/20 0523 07/23/20 0740  TROPONINIHS 119* 124*     Chemistry Recent Labs  Lab 07/23/20 0523  NA 142  K 3.4*  CL 102  CO2 30  GLUCOSE 228*  BUN 36*  CREATININE 2.59*  CALCIUM 9.5  GFRNONAA 29*  ANIONGAP 10    Recent Labs  Lab 07/23/20 0523  PROT 7.1  ALBUMIN 4.0  AST 21  ALT 23  ALKPHOS 81  BILITOT 0.9   Hematology Recent Labs  Lab 07/23/20 0523  WBC 7.5  RBC 5.17  HGB 14.2  HCT 43.8  MCV 84.7  MCH 27.5  MCHC 32.4  RDW 13.4  PLT 241   BNP Recent Labs  Lab 07/23/20 0740  BNP 298.0*    DDimer No results for input(s): DDIMER in the last 168 hours.   Radiology/Studies:  DG Chest 2 View  Result Date: 07/23/2020 CLINICAL DATA:  55 year old male with shortness of breath and productive cough. EXAM: CHEST - 2 VIEW COMPARISON:  [place] Chest radiographs 05/15/2020 and earlier. FINDINGS: PA and lateral views. Lung  volumes and mediastinal contours are stable and within normal limits. Mild chronic elevation of the right hemidiaphragm compatible with normal variation. Small chronic  calcified granulomas in the lungs which otherwise appear clear. No pneumothorax or pleural effusion. No acute osseous abnormality identified. Visualized tracheal air column is within normal limits. Negative visible bowel gas pattern. IMPRESSION: No acute cardiopulmonary abnormality. Small bilateral calcified granulomas. Electronically Signed   By: Genevie Ann M.D.   On: 07/23/2020 06:07   CT Chest Wo Contrast  Result Date: 07/23/2020 CLINICAL DATA:  Cough and shortness of breath. History of liver transplant. EXAM: CT CHEST WITHOUT CONTRAST TECHNIQUE: Multidetector CT imaging of the chest was performed following the standard protocol without IV contrast. COMPARISON:  Chest radiograph July 23, 2020. CT chest August 25, 2019 FINDINGS: Cardiovascular: There is no demonstrable pulmonary embolus. Visualized great vessels appear unremarkable. Note that there is aberrant origin of the right subclavian artery which arises posterior to the other great vessels and crosses posterior to the esophagus to reach the right side. There is a focus of calcification in the left anterior descending coronary artery. There is no pericardial effusion or pericardial thickening. Mediastinum/Nodes: Thyroid appears normal. No adenopathy evident. No esophageal lesions are appreciable. Lungs/Pleura: There are scattered small calcified granulomas. There is no appreciable edema or airspace opacity. No pleural effusions are evident. No pneumothorax. Trachea and major bronchial structures appear patent. Upper Abdomen: Clips noted in upper abdomen, unchanged. By report, patient has had liver transplant. Visualized upper abdominal structures appear unremarkable. Musculoskeletal: No blastic or lytic bone lesions. No chest wall lesions. IMPRESSION: 1. Scattered calcified granulomas. Lungs  otherwise clear. No pleural effusions. 2.  No evident adenopathy. 3.  Foci of coronary artery calcification noted. Electronically Signed   By: Lowella Grip III M.D.   On: 07/23/2020 08:09     Assessment and Plan:   1. Elevated high-sensitivity troponin, likely supply demand mismatch --Reports chest pain 2/2 cough x2 weeks.  Similar to previous sx of pna. Previous Echo and MPI as above. HS Tn minimally elevated and flat trending at 119  124. EKG without acute ST/T changes. Less consistent with ACS.  Consider HS Tn 2/2 supply demand mismatch in the setting of CKD4, AOC HFpEF in setting of poorly controlled BP, pna vs bronchitis. Volume status difficult to ascertain given body habitus and frequent coughing on lung auscultation, but suspect at least slightly volume up on exam.BNP 298.0. CXR with scattered calcified granulomas.    Continue to cycle HS Tn until peaked and downtrending.    Serial EKGs as below.  No indication for IV heparin at this time.   No plans for further ischemic work-up at this time given current presentation not consistent with ACS, as well as not an ideal candidate for LHC given Cr.    Recommend treatment of underlying infection. Continue abx.  Restart PTA medications as tolerated. Caution with BB given first degree AVB with trifascicular block.   Replete electrolytes as below with repeat EKG s/p electrolytes at goal.   Optimize BP control. Continue hydralazine. Caution with gentle IV diuresis given renal function and as outlined below. Recent CareEverywhere BMET shows previous Cr similar to that of current.  2. HTN  BP improving.   Continue current medications. Titrate as needed.   Avoid AV nodal blocking agents as below for now.  No ACE/ARB/ARNI given CKD4.   3. AOC HFpEF  Shortness of breath likely multifactorial in the setting of bronchitis versus pna with suspicion of at least slight volume overload due to poorly controlled BP.  BNP 298.0.  03/2020  echo with EF normal, concentric hypertrophy and as detailed above.  Restart PTA medications as tolerated.    Continue very gentle IV diuresis as Cr allows. Transition to oral diuresis once euvolemic.   Monitor I/Os, daily standing wt.  Caution with beta-blocker and AV nodal blocking agents given first-degree AV block with right bundle branch block and LAFB.   Caution with QT prolonging medications.     Recommend repeat EKG s/p electrolyte repletion.  As below, K may be lower than resulted given hyperglycemia.   4. Hypokalemia --Potassium 3.4.  Replete with goal 4.0.   --Consider that potassium is likely lower than resulted given hyperglycemia.  Close monitoring given IV diuresis.  --Check Mg with goal 2.0.   --Recommend repeat EKG following electrolyte repletion as above.  5. Prolonged QTc, Trifascicular block --As above, caution with AV nodal blocking agents given trifascicular block.. Recommend against Zofran given prolonged QTC.  Repeat EKG s/p electrolyte repletion and glycemic control.  Recommend ambulation before discharge. Close follow-up with OP cardiologist.   6. CKD4 --Recent creatinine 2.59 with BUN 36.  Avoid nephrotoxins. Caution with IV diuresis. BP and glycemic control recommended. CareEverywhere recent Cr 2.69 with BUN 29.   Consider nephrology consultation.  7. HLD --Care everywhere labs show LDL 115.  Consider OP statin if reasonable with consideration of liver transplant and will defer to OP setting.  LDL control recommended for risk factor modification.  8. Poorly controlled DM2 --Care everywhere labs show hemoglobin A1c of 7.2 on 07/06/2020.  Glycemic control recommended for risk factor modification.  Reference allergies given current list and aspart prot / insulin listing. Per IM.  9. Bronchitis, Acute respiratory failure with hypoxia  --As above, recommend treatment of underlying infection.  Suspect bronchitis versus pneumonia in the setting of  immunosuppression as previously noted.  Continue antibiotics.  Oxygen as needed.  Consider inhalers/nebulizer treatment as indicated.  Consider Tessalon Perles. IV diuresis as Cr allows.  Further recommendations per IM.      TIMI Risk Score for Unstable Angina or Non-ST Elevation MI:   The patient's TIMI risk score is 2, which indicates a 8% risk of all cause mortality, new or recurrent myocardial infarction or need for urgent revascularization in the next 14 days.   New York Heart Association (NYHA) Functional Class NYHA ClassII- III        For questions or updates, please contact [facility] Please consult [email or web address] for contact info under    Signed, Arvil Chaco, PA-C  07/23/2020 1:57 PM

## 2020-07-23 NOTE — ED Notes (Signed)
Patient ambulated to and from room commode with a steady gait. Patient expressed concerns about being in the ED while holding for an admit bed and said he might want to go to [facility]. When offered to speak with the hospitalist, patient declined.

## 2020-07-23 NOTE — ED Notes (Signed)
Charge nurse notified of troponin, acuity level changed

## 2020-07-23 NOTE — H&P (Signed)
History and Physical    Wayne Parker [id number] DOB: 12/12/1965 DOA: 07/23/2020  Referring MD/NP/PA:   PCP: Rikki Spearing, NP   Patient coming from:  The patient is coming from home.  At baseline, pt is independent for most of ADL.        Chief Complaint: Shortness of breath  HPI: Wayne Parker is a 55 y.o. male with medical history significant of liver transplantation on immunosuppressants, HTN, DM, CKD-4, dCHF, presents with SOB.  Patient states that he has been having cough, shortness of breath for more than 2 weeks, which has been progressively worsening.  He coughs up white-yellow sputum.  No fever, chills, chest pain.  Patient was found to have oxygen desaturation to mid 80s in the ED, started 2 L oxygen with 97% saturation.  Patient denies nausea, vomiting, diarrhea, abdominal pain, symptoms of UTI or unilateral weakness.  ED Course: pt was found to have troponin level 190/124, BNP 298, lactic acid 1.4, pending Covid PCR, stable renal function, normal liver function, potassium 3.4, temperature normal, blood pressure 173/93, heart rate is 77, RR 24, chest x-ray negative for infiltration, but showed small bilateral calcified granulomas.  CT of chest showed scattered calcified granulomas, otherwise not impressive. Pt is admitted to progressive bed as inpatient.  Cardiology is consulted  Review of Systems:   General: no fevers, chills, no body weight gain, has poor appetite, has fatigue HEENT: no blurry vision, hearing changes or sore throat Respiratory: has dyspnea, coughing, wheezing CV: no chest pain, no palpitations GI: no nausea, vomiting, abdominal pain, diarrhea, constipation GU: no dysuria, burning on urination, increased urinary frequency, hematuria  Ext: has leg edema Neuro: no unilateral weakness, numbness, or tingling, no vision change or hearing loss Skin: no rash, no skin tear. MSK: No muscle spasm, no deformity, no limitation of range of movement in spin Heme:  No easy bruising.  Travel history: No recent long distant travel.  Allergy:  Allergies  Allergen Reactions  . Insulin Aspart Prot & Aspart Other (See Comments)    Burning on injection and skin reaction.      Past Medical History:  Diagnosis Date  . Chronic kidney disease   . Cirrhosis ([place])   . Heart murmur   . Hypertension   . Sepsis ([place])   . Sleep apnea     Past Surgical History:  Procedure Laterality Date  . LIVER TRANSPLANT    . NO PAST SURGERIES      Social History:  reports that he has never smoked. He has never used smokeless tobacco. He reports that he does not drink alcohol. No history on file for drug use.  Family History: Reviewed with patient, patient states that all family members do not have significant medical issues.   Prior to Admission medications   Medication Sig Start Date End Date Taking? Authorizing Provider  aspirin EC 81 MG tablet Take 81 mg by mouth daily.    [provider]  Cholecalciferol (VITAMIN D) 2000 UNITS tablet Take 2,000 Units by mouth daily.    [provider]  clindamycin (CLEOCIN) 300 MG capsule Take 1 capsule (300 mg total) by mouth 4 (four) times daily. X 7 days 09/15/15   [name], MD  glimepiride (AMARYL) 2 MG tablet Take 2 mg by mouth daily. 06/17/15   [provider]  HUMALOG KWIKPEN 100 UNIT/ML KiwkPen Inject 5-20 Units into the skin 3 (three) times daily with meals as needed (high blood sugar). High blood sugar levels : 120-140=5  units 145-170= 10 units 170-199= 15 unitsand if above 200 use 20 units 06/23/15   [provider]  HYDROcodone-acetaminophen (NORCO) 5-325 MG tablet Take 1-2 tablets by mouth every 4 (four) hours as needed for severe pain. 09/15/15   [name], MD  lisinopril (PRINIVIL,ZESTRIL) 10 MG tablet Take 10 mg by mouth daily. 05/19/15   [provider]  mycophenolate (MYFORTIC) 180 MG EC tablet Take 360 mg by mouth 2 (two) times daily.    [provider]  tacrolimus (PROGRAF) 0.5 MG capsule Take 0.5-1 mg by mouth 2 (two) times daily. Takes '1mg'$  in the morning and 0.'5mg'$  at night    [provider]  Testosterone (ANDROGEL PUMP) 20.25 MG/ACT (1.62%) GEL Apply 1 application topically daily. Use 2 pumps once daily and apply to skin 06/22/15   [provider]    Physical Exam: Vitals:   07/23/20 0525 07/23/20 0800 07/23/20 1000 07/23/20 1100  BP:  (!) 173/93 (!) 153/88 138/70  Pulse:  77 62 64  Resp:  (!) 24 (!) 28 (!) 29  Temp:      TempSrc:      SpO2:  97% 96% 98%  Weight: 131.5 kg     Height: '5\' 10"'$  (1.778 m)      General: Not in acute distress HEENT:       Eyes: PERRL, EOMI, no scleral icterus.       ENT: No discharge from the ears and nose, no pharynx injection, no tonsillar enlargement.        Neck: No JVD, no bruit, no mass felt. Heme: No neck lymph node enlargement. Cardiac: S1/S2, RRR, No gallops or rubs. Respiratory: has mild wheezing and rhonchi bilaterally GI: Soft, nondistended, nontender, no rebound pain, no organomegaly, BS present. GU: No hematuria Ext: has trace pitting leg edema bilaterally. 1+DP/PT pulse bilaterally. Musculoskeletal: No joint deformities, No joint redness or warmth, no limitation of ROM in spin. Skin: No rashes.  Neuro: Alert, oriented X3, cranial nerves II-XII grossly intact, moves all extremities normally.  Psych: Patient is not psychotic, no suicidal or hemocidal ideation.  Labs on Admission: I have personally reviewed following labs and imaging studies  CBC: Recent Labs  Lab 07/23/20 0523  WBC 7.5  NEUTROABS 5.4  HGB 14.2  HCT 43.8  MCV 84.7  PLT [id number]   Basic Metabolic Panel: Recent Labs  Lab 07/23/20 0523  NA 142  K 3.4*  CL 102  CO2 30  GLUCOSE 228*  BUN 36*  CREATININE 2.59*  CALCIUM 9.5   GFR: Estimated Creatinine Clearance: 45.5 mL/min (A) (by C-G formula based on SCr of 2.59 mg/dL (H)). Liver Function Tests: Recent Labs  Lab  07/23/20 0523  AST 21  ALT 23  ALKPHOS 81  BILITOT 0.9  PROT 7.1  ALBUMIN 4.0   No results for input(s): LIPASE, AMYLASE in the last 168 hours. No results for input(s): AMMONIA in the last 168 hours. Coagulation Profile: No results for input(s): INR, PROTIME in the last 168 hours. Cardiac Enzymes: No results for input(s): CKTOTAL, CKMB, CKMBINDEX, TROPONINI in the last 168 hours. BNP (last 3 results) No results for input(s): PROBNP in the last 8760 hours. HbA1C: No results for input(s): HGBA1C in the last 72 hours. CBG: Recent Labs  Lab 07/23/20 1344  GLUCAP 190*   Lipid Profile: No results for input(s): CHOL, HDL, LDLCALC, TRIG, CHOLHDL, LDLDIRECT in the last 72 hours. Thyroid Function Tests: No results for input(s): TSH, T4TOTAL, FREET4, T3FREE, THYROIDAB in the last  72 hours. Anemia Panel: No results for input(s): VITAMINB12, FOLATE, FERRITIN, TIBC, IRON, RETICCTPCT in the last 72 hours. Urine analysis:    Component Value Date/Time   COLORURINE AMBER (A) 07/09/2015 1134   APPEARANCEUR CLEAR 07/09/2015 1134   LABSPEC 1.031 (H) 07/09/2015 1134   PHURINE 5.5 07/09/2015 1134   GLUCOSEU 500 (A) 07/09/2015 1134   HGBUR SMALL (A) 07/09/2015 1134   HGBUR trace-intact 12/07/2008 0854   BILIRUBINUR NEGATIVE 07/09/2015 1134   KETONESUR NEGATIVE 07/09/2015 1134   PROTEINUR 100 (A) 07/09/2015 1134   UROBILINOGEN 4.0 12/07/2008 0854   NITRITE NEGATIVE 07/09/2015 1134   LEUKOCYTESUR NEGATIVE 07/09/2015 1134   Sepsis Labs: '@LABRCNTIP'$ (procalcitonin:4,lacticidven:4) ) Recent Results (from the past 240 hour(s))  SARS Coronavirus 2 by RT PCR (hospital order, performed in [place] hospital lab) Nasopharyngeal Nasopharyngeal Swab     Status: None   Collection Time: 07/23/20 12:02 PM   Specimen: Nasopharyngeal Swab  Result Value Ref Range Status   SARS Coronavirus 2 NEGATIVE NEGATIVE Final    Comment: (NOTE) SARS-CoV-2 target nucleic acids are NOT DETECTED.  The SARS-CoV-2  RNA is generally detectable in upper and lower respiratory specimens during the acute phase of infection. The lowest concentration of SARS-CoV-2 viral copies this assay can detect is 250 copies / mL. A negative result does not preclude SARS-CoV-2 infection and should not be used as the sole basis for treatment or other patient management decisions.  A negative result may occur with improper specimen collection / handling, submission of specimen other than nasopharyngeal swab, presence of viral mutation(s) within the areas targeted by this assay, and inadequate number of viral copies (<250 copies / mL). A negative result must be combined with clinical observations, patient history, and epidemiological information.  Fact Sheet for Patients:   StrictlyIdeas.no  Fact Sheet for Healthcare Providers: BankingDealers.co.za  This test is not yet approved or  cleared by the [place] FDA and has been authorized for detection and/or diagnosis of SARS-CoV-2 by FDA under an Emergency Use Authorization (EUA).  This EUA will remain in effect (meaning this test can be used) for the duration of the COVID-19 declaration under Section 564(b)(1) of the Act, 21 U.S.C. section 360bbb-3(b)(1), unless the authorization is terminated or revoked sooner.  Performed at [facility], [address] [place], [place] [address]      Radiological Exams on Admission: DG Chest 2 View  Result Date: 07/23/2020 CLINICAL DATA:  55 year old male with shortness of breath and productive cough. EXAM: CHEST - 2 VIEW COMPARISON:  [facility] Chest radiographs 05/15/2020 and earlier. FINDINGS: PA and lateral views. Lung volumes and mediastinal contours are stable and within normal limits. Mild chronic elevation of the right hemidiaphragm compatible with normal variation. Small chronic calcified granulomas in the lungs which otherwise appear clear. No  pneumothorax or pleural effusion. No acute osseous abnormality identified. Visualized tracheal air column is within normal limits. Negative visible bowel gas pattern. IMPRESSION: No acute cardiopulmonary abnormality. Small bilateral calcified granulomas. Electronically Signed   By: Genevie Ann M.D.   On: 07/23/2020 06:07   CT Chest Wo Contrast  Result Date: 07/23/2020 CLINICAL DATA:  Cough and shortness of breath. History of liver transplant. EXAM: CT CHEST WITHOUT CONTRAST TECHNIQUE: Multidetector CT imaging of the chest was performed following the standard protocol without IV contrast. COMPARISON:  Chest radiograph July 23, 2020. CT chest August 25, 2019 FINDINGS: Cardiovascular: There is no demonstrable pulmonary embolus. Visualized great vessels appear unremarkable. Note that there is  aberrant origin of the right subclavian artery which arises posterior to the other great vessels and crosses posterior to the esophagus to reach the right side. There is a focus of calcification in the left anterior descending coronary artery. There is no pericardial effusion or pericardial thickening. Mediastinum/Nodes: Thyroid appears normal. No adenopathy evident. No esophageal lesions are appreciable. Lungs/Pleura: There are scattered small calcified granulomas. There is no appreciable edema or airspace opacity. No pleural effusions are evident. No pneumothorax. Trachea and major bronchial structures appear patent. Upper Abdomen: Clips noted in upper abdomen, unchanged. By report, patient has had liver transplant. Visualized upper abdominal structures appear unremarkable. Musculoskeletal: No blastic or lytic bone lesions. No chest wall lesions. IMPRESSION: 1. Scattered calcified granulomas. Lungs otherwise clear. No pleural effusions. 2.  No evident adenopathy. 3.  Foci of coronary artery calcification noted. Electronically Signed   By: Lowella Grip III M.D.   On: 07/23/2020 08:09     EKG: I have personally reviewed.   Sinus rhythm, QTC 488, bifascicular block   Assessment/Plan Principal Problem:   Acute on chronic diastolic (congestive) heart failure (HCC) Active Problems:   CKD (chronic kidney disease), stage IV (HCC)   Hypertension   Type II diabetes mellitus with renal manifestations (HCC)   Hypokalemia   Elevated troponin   History of liver transplant (HCC)   Bronchitis, acute   Acute on chronic diastolic (congestive) heart failure [facility]): Patient has elevated BNP 298, shortness of breath, leg edema, indicating possible CHF exacerbation.  2D echo on 09/11/2010 showed EF of 60-65%. Given his CKD-IV, will carefully diurese patient, give low-dose of IV Lasix 20 mg twice daily.   -Will admit to progressive unit as inpatient -Lasix 20 mg bid by IV -2d echo -Daily weights -strict I/O's -Low salt diet -Fluid restriction -Obtain REDs Vest reading  Elevated troponin: trop 119 -->124. No CP.  Likely due to demand ischemia in the setting of CKD-4. Card is consulted, PA Mickle Plumb who is working with Dr. Rockey Situ. - Trend Trop - ASA and imdur - Risk factor stratification: will check FLP and A1C  - 2d echo  CKD (chronic kidney disease), stage IV ([place]): stable.  Recent baseline creatinine ~2.7.  His creatinine is 2.59, BUN 36 today.  Hypertension -IV hydralazine prn -Patient is on IV Lasix -Continue Coreg, clonidine, hydralazine  Type II diabetes mellitus with renal manifestations ([place]): Levemir and NovoLog.  In the chart, patient was labeled as being allergic to insulin aspart, but pt is taking NovoLog insulin aspart at home -SSI -Decrease Levemir dose from 12 to 8 units daily.  Hypokalemia: K= 3.4 on admission. - Repleted - Check Mg level  History of liver transplant [facility]): Liver function normal -Continue tacrolimus and mycophenolate  Possible acute bronchitis and acute respiratory failure with hypoxia: Patient has patient has oxygen desaturation to mid 80s productive cough, mild wheezing and  rhonchi on auscultation, indicating possible acute bronchitis.,  Currently on 2 L oxygen. Chest x-ray negative for infiltration.  Since patient is immunosuppressed, will start antibiotics. -Patient received 1 dose of Rocephin and azithromycin by IV ED -Will continue azithromycin 250 mg orally -Bronchodilators -As needed Mucinex -f/u blood culture and urine culture.     DVT ppx: SQ Heparin   Code Status: Full code Family Communication: not done, no family member is at bed side. Disposition Plan:  Anticipate discharge back to previous environment Consults called:  Message sent to PA, Mickle Plumb who is working with Dr.Gollan Admission status and Level of care: Progressive Cardiac:  as inpt      Status is: Inpatient  Remains inpatient appropriate because:Inpatient level of care appropriate due to severity of illness   Dispo: The patient is from: Home              Anticipated d/c is to: Home              Anticipated d/c date is: 2 days              Patient currently is not medically stable to d/c.   Difficult to place patient No          Date of Service 07/23/2020    [facility]   If 7PM-7AM, please contact night-coverage [email or web address] 07/23/2020, 1:56 PM

## 2020-07-23 NOTE — ED Notes (Signed)
Patient ambulated to room commode off of O2 with a steady gait. No dyspnea noted. Patient given a warm blanket.

## 2020-07-23 NOTE — ED Notes (Signed)
Patient placed on 2L O2 via [place] by physician according to previous nurse for desats while sleeping.

## 2020-07-23 NOTE — ED Notes (Signed)
Pt presents to ED with c/o of cough and increasing SOB over the past 2 weeks. Pt states a HX of pneomonia but states he just "cant kick it". Pt denies chest pain or SOB at rest. Pt does have a productive cough at this time, pt states producing yellowish/whitwe sputum. Pt states a HX of immunosuppression due to a liver transplant. Pt denies fevers or chills. Pt denies N/V/D. Pt denies a smoking HX at this time. Pt denies HX of underlying lung issues such as COPD or asthma. No increased work of breathing noted. Pt is A&Ox4. NAD noted.

## 2020-07-24 DIAGNOSIS — I5033 Acute on chronic diastolic (congestive) heart failure: Secondary | ICD-10-CM

## 2020-07-24 LAB — CBC
HCT: 42 % (ref 39.0–52.0)
Hemoglobin: 13.6 g/dL (ref 13.0–17.0)
MCH: 27.4 pg (ref 26.0–34.0)
MCHC: 32.4 g/dL (ref 30.0–36.0)
MCV: 84.5 fL (ref 80.0–100.0)
Platelets: 224 10*3/uL (ref 150–400)
RBC: 4.97 MIL/uL (ref 4.22–5.81)
RDW: 13.4 % (ref 11.5–15.5)
WBC: 7.4 10*3/uL (ref 4.0–10.5)
nRBC: 0 % (ref 0.0–0.2)

## 2020-07-24 LAB — TROPONIN I (HIGH SENSITIVITY)
Troponin I (High Sensitivity): 104 ng/L (ref <18)
Troponin I (High Sensitivity): 111 ng/L (ref <18)

## 2020-07-24 LAB — LIPID PANEL
Cholesterol: 163 mg/dL (ref 0–200)
HDL: 27 mg/dL — ABNORMAL LOW (ref >40)
Total CHOL/HDL Ratio: 6 RATIO
Triglycerides: 302 mg/dL — ABNORMAL HIGH (ref <150)
VLDL: 60 mg/dL — ABNORMAL HIGH (ref 0–40)

## 2020-07-24 LAB — BASIC METABOLIC PANEL
Anion gap: 9 (ref 5–15)
BUN: 34 mg/dL — ABNORMAL HIGH (ref 6–20)
CO2: 30 mmol/L (ref 22–32)
Calcium: 9.2 mg/dL (ref 8.9–10.3)
Chloride: 99 mmol/L (ref 98–111)
Creatinine, Ser: 2.63 mg/dL — ABNORMAL HIGH (ref 0.61–1.24)
GFR, Estimated: 28 mL/min — ABNORMAL LOW (ref >60)
Glucose, Bld: 177 mg/dL — ABNORMAL HIGH (ref 70–99)
Potassium: 3.4 mmol/L — ABNORMAL LOW (ref 3.5–5.1)
Sodium: 138 mmol/L (ref 135–145)

## 2020-07-24 LAB — CULTURE, BLOOD (ROUTINE X 2)
Culture: NO GROWTH
Culture: NO GROWTH

## 2020-07-24 LAB — GLUCOSE, CAPILLARY: Glucose-Capillary: 163 mg/dL — ABNORMAL HIGH (ref 70–99)

## 2020-07-24 LAB — HEMOGLOBIN A1C
Hgb A1c MFr Bld: 7 % — ABNORMAL HIGH (ref 4.8–5.6)
Mean Plasma Glucose: 154.2 mg/dL

## 2020-07-24 LAB — MAGNESIUM: Magnesium: 1.9 mg/dL (ref 1.7–2.4)

## 2020-07-24 LAB — HIV ANTIBODY (ROUTINE TESTING W REFLEX): HIV Screen 4th Generation wRfx: NONREACTIVE

## 2020-07-24 MED ORDER — HYDROCOD POLST-CPM POLST ER 10-8 MG/5ML PO SUER: 5.0000 mL | Freq: Three times a day (TID) | ORAL | Status: DC | PRN

## 2020-07-24 MED ORDER — ONDANSETRON HCL 4 MG/2ML IJ SOLN: 4.0000 mg | INTRAMUSCULAR | Status: DC | PRN

## 2020-07-24 MED ORDER — DM-GUAIFENESIN ER 30-600 MG PO TB12: 1.0000 | ORAL_TABLET | Freq: Two times a day (BID) | ORAL | 0 refills | Status: AC | PRN

## 2020-07-24 MED ORDER — TRAZODONE HCL 50 MG PO TABS: 25.0000 mg | ORAL_TABLET | Freq: Every evening | ORAL | Status: DC | PRN

## 2020-07-24 MED ORDER — MYCOPHENOLATE SODIUM 180 MG PO TBEC
180.0000 mg | DELAYED_RELEASE_TABLET | Freq: Two times a day (BID) | ORAL | Status: DC
  Administered 2020-07-24 (×2): 180 mg via ORAL
  Filled 2020-07-24 (×3): qty 1

## 2020-07-24 MED ORDER — ASPIRIN 81 MG PO TBEC
81.0000 mg | DELAYED_RELEASE_TABLET | Freq: Every day | ORAL | 11 refills | Status: AC
Start: 2020-07-25 — End: ?

## 2020-07-24 MED ORDER — TACROLIMUS 0.5 MG PO CAPS
0.5000 mg | ORAL_CAPSULE | Freq: Two times a day (BID) | ORAL | Status: DC
  Administered 2020-07-24 (×2): 0.5 mg via ORAL
  Filled 2020-07-24 (×3): qty 1

## 2020-07-24 MED ORDER — ACETAMINOPHEN 325 MG PO TABS: 650.0000 mg | ORAL_TABLET | ORAL | Status: DC | PRN

## 2020-07-24 MED ORDER — AZITHROMYCIN 250 MG PO TABS: ORAL_TABLET | ORAL | 0 refills | Status: DC

## 2020-07-24 NOTE — Discharge Summary (Signed)
Physician Discharge Summary  Wayne Parker [id number] DOB: Jan 28, 1966 DOA: 07/23/2020  PCP: Rikki Spearing, NP  Admit date: 07/23/2020   Discharge date: 07/24/2020  Admitted From:  Home. Disposition:   Home.  Recommendations for Outpatient Follow-up:  1. Follow up with PCP in 1-2 weeks. 2. Please obtain BMP/CBC in one week. 3. Advised to follow-up with Cardiology as scheduled. 4. Advised to take Zithromax to 250 mg daily for 3 more days to complete 5-day course.  Home Health:  None Equipment/Devices: None.  Discharge Condition: Stable CODE STATUS:Full code Diet recommendation: Heart Healthy   Brief Summary / Hospital Course: Wayne Parker is a 55 y.o. male with PMH significant of liver transplantation on immunosuppressants, HTN, DM, CKD-4, dCHF, presents to the ED with worsening shortness of breath.  Patient reports cough and shortness of breath for more than 2 weeks which has been progressively getting worse. Patient was found to have oxygen desaturation to mid 80s in the ED, started on 2 L oxygen with 97% saturation. Patient denies any fever, recent travel or sick contacts.  Patient was found to have elevated troponin levels, BNP 298, Covid negative, other labs unremarkable. CT of the chest showed scattered calcified granulomas otherwise not impressive. Patient was admitted for acute hypoxic respiratory failure secondary to acute on chronic diastolic CHF exacerbation.  Cardiology was consulted, Patient symptoms are consistent with acute bronchitis.  It does not seem CHF exacerbation.  Patient is very compliant with his Lasix dosing, BNP not impressive.  Advised to continue Lasix and antibiotics and antitussives for bronchitis.  Elevated troponin could be secondary to demand ischemia in the setting of bronchitis, hypoxia and renal failure,  no further ischemic work-up needed at this time.  Patient seems better and wants to be discharged. Cardiology signed off.  Patient is being discharged  home.  He was managed for below problems.  Discharge Diagnoses:  Principal Problem:   Acute on chronic diastolic (congestive) heart failure (HCC) Active Problems:   CKD (chronic kidney disease), stage IV (HCC)   Hypertension   Type II diabetes mellitus with renal manifestations (HCC)   Hypokalemia   Elevated troponin   History of liver transplant ([place])   Bronchitis, acute  Acute on chronic diastolic (congestive) heart failure [facility]):  Patient has elevated BNP 298, shortness of breath, leg edema, indicating possible CHF exacerbation.   2D echo on 09/11/2010 showed EF of 60-65%.  Given his CKD-IV, will carefully diurese patient, give low-dose of IV Lasix 20 mg twice daily. Continue Lasix 20 mg twice daily IV Daily weight, intake output charting.  Elevated troponin: trop 119 -->124. No CP.   Likely due to demand ischemia in the setting of CKD-4.  Card is consulted, PA Mickle Plumb who is working with Dr. Rockey Situ. Troponin remains flat. -Continue ASA and imdur - Risk factor stratification: will check FLP and A1C  - 2d echo:  CKD (chronic kidney disease), stage IV ([place]): stable.   Recent baseline creatinine ~2.7.  His creatinine is 2.59, BUN 36 today.  Hypertension -IV hydralazine prn. -Patient is on IV Lasix -Continue Coreg, clonidine, hydralazine  Type II diabetes mellitus with renal manifestations ([place]):  Continue Levemir and NovoLog.   In the chart, patient was labeled as being allergic to insulin aspart, but pt is taking NovoLog insulin aspart at home -SSI -Decrease Levemir dose from 12 to 8 units daily.  Hypokalemia:  Resolved . K= 3.4 on admission. - Repleted - Check Mg level  History of liver transplant [facility]): Liver function  normal -Continue tacrolimus and mycophenolate  Possible acute bronchitis and acute respiratory failure with hypoxia:  Patient has oxygen desaturation to mid 80s productive cough, mild wheezing and rhonchi on auscultation, indicating possible  acute bronchitis.,  Currently on 2 L oxygen. Chest x-ray negative for infiltration.  Since patient is immunosuppressed, will start antibiotics. -Patient received 1 dose of Rocephin and azithromycin by IV ED -Will continue azithromycin 250 mg orally -Bronchodilators -As needed Mucinex -f/u blood culture and urine culture.     Discharge Instructions  Discharge Instructions    Call MD for:  difficulty breathing, headache or visual disturbances   Complete by: As directed    Call MD for:  persistant dizziness or light-headedness   Complete by: As directed    Call MD for:  persistant nausea and vomiting   Complete by: As directed    Diet - low sodium heart healthy   Complete by: As directed    Diet Carb Modified   Complete by: As directed    Discharge instructions   Complete by: As directed    Advised to follow-up with primary care physician in 1 week. Advised to follow-up with cardiology as scheduled. Advised to take Zithromax to 250 mg daily for 3 more days to complete 5-day course.   Increase activity slowly   Complete by: As directed      Allergies as of 07/24/2020      Reactions   Insulin Aspart Prot & Aspart Other (See Comments)   Burning on injection and skin reaction.        Medication List    TAKE these medications   aspirin 81 MG EC tablet Take 1 tablet (81 mg total) by mouth daily. Swallow whole. Start taking on: July 25, 2020   azithromycin 250 MG tablet Commonly known as: ZITHROMAX Take zithromax 250 mg daily for 3 days. Start taking on: July 25, 2020   carvedilol 12.5 MG tablet Commonly known as: COREG Take 12.5 mg by mouth 2 (two) times daily with a meal.   cholecalciferol 25 MCG (1000 UNIT) tablet Commonly known as: VITAMIN D3 Take 1,000 Units by mouth daily.   cloNIDine 0.1 MG tablet Commonly known as: CATAPRES Take 0.1 mg by mouth at bedtime.   dextromethorphan-guaiFENesin 30-600 MG 12hr tablet Commonly known as: MUCINEX DM Take 1 tablet  by mouth 2 (two) times daily as needed for cough.   furosemide 40 MG tablet Commonly known as: LASIX Take 40-80 mg by mouth See admin instructions. Take 1 tablet (40 mg) daily. Take 2 tablets (80 mg) if you weigh >300 lbs   hydrALAZINE 25 MG tablet Commonly known as: APRESOLINE Take 25 mg by mouth 4 (four) times daily.   insulin detemir 100 UNIT/ML injection Commonly known as: LEVEMIR Inject 12 Units into the skin at bedtime.   isosorbide mononitrate 30 MG 24 hr tablet Commonly known as: IMDUR Take 30 mg by mouth daily.   magnesium oxide 400 MG tablet Commonly known as: MAG-OX Take 400 mg by mouth daily.   multivitamin with minerals Tabs tablet Take 1 tablet by mouth daily.   mycophenolate 180 MG EC tablet Commonly known as: MYFORTIC Take 180 mg by mouth 2 (two) times daily.   NovoLOG FlexPen 100 UNIT/ML FlexPen Generic drug: insulin aspart Inject 10-30 Units into the skin 3 (three) times daily with meals. Per sliding scale   tacrolimus 0.5 MG capsule Commonly known as: PROGRAF Take 0.5 mg by mouth 2 (two) times daily.   Testosterone 20.25 MG/ACT (  1.62%) Gel Apply 1 application topically daily. Use 2 pumps once daily and apply to skin       Follow-up Information    Francine Graven D, NP Follow up in 1 week(s).   Specialty: Nurse Practitioner Contact information: [place] [address] [phone]        Minna Merritts, MD Follow up in 2 week(s).   Specialty: Cardiology Contact information: [address] [place] [address] [phone]              Allergies  Allergen Reactions  . Insulin Aspart Prot & Aspart Other (See Comments)    Burning on injection and skin reaction.      Consultations:  Cardiology   Procedures/Studies: DG Chest 2 View  Result Date: 07/23/2020 CLINICAL DATA:  55 year old male with shortness of breath and productive cough. EXAM: CHEST - 2 VIEW COMPARISON:  [place]  Chest radiographs 05/15/2020 and earlier. FINDINGS: PA and lateral views. Lung volumes and mediastinal contours are stable and within normal limits. Mild chronic elevation of the right hemidiaphragm compatible with normal variation. Small chronic calcified granulomas in the lungs which otherwise appear clear. No pneumothorax or pleural effusion. No acute osseous abnormality identified. Visualized tracheal air column is within normal limits. Negative visible bowel gas pattern. IMPRESSION: No acute cardiopulmonary abnormality. Small bilateral calcified granulomas. Electronically Signed   By: Genevie Ann M.D.   On: 07/23/2020 06:07   CT Chest Wo Contrast  Result Date: 07/23/2020 CLINICAL DATA:  Cough and shortness of breath. History of liver transplant. EXAM: CT CHEST WITHOUT CONTRAST TECHNIQUE: Multidetector CT imaging of the chest was performed following the standard protocol without IV contrast. COMPARISON:  Chest radiograph July 23, 2020. CT chest August 25, 2019 FINDINGS: Cardiovascular: There is no demonstrable pulmonary embolus. Visualized great vessels appear unremarkable. Note that there is aberrant origin of the right subclavian artery which arises posterior to the other great vessels and crosses posterior to the esophagus to reach the right side. There is a focus of calcification in the left anterior descending coronary artery. There is no pericardial effusion or pericardial thickening. Mediastinum/Nodes: Thyroid appears normal. No adenopathy evident. No esophageal lesions are appreciable. Lungs/Pleura: There are scattered small calcified granulomas. There is no appreciable edema or airspace opacity. No pleural effusions are evident. No pneumothorax. Trachea and major bronchial structures appear patent. Upper Abdomen: Clips noted in upper abdomen, unchanged. By report, patient has had liver transplant. Visualized upper abdominal structures appear unremarkable. Musculoskeletal: No blastic or lytic bone  lesions. No chest wall lesions. IMPRESSION: 1. Scattered calcified granulomas. Lungs otherwise clear. No pleural effusions. 2.  No evident adenopathy. 3.  Foci of coronary artery calcification noted. Electronically Signed   By: Lowella Grip III M.D.   On: 07/23/2020 08:09       Subjective: Patient was seen and examined at bedside.  Overnight events noted.  Patient reports feeling much better and wants to be discharged home.  Patient has ambulated in the hallway without any difficulty breathing.  Discharge Exam: Vitals:   07/24/20 0506 07/24/20 0854  BP: (!) 148/77 (!) 153/79  Pulse: 70 66  Resp: 18 20  Temp: 98.1 F (36.7 C) 98.6 F (37 C)  SpO2: 97% 96%   Vitals:   07/23/20 1900 07/23/20 2058 07/24/20 0506 07/24/20 0854  BP: 127/71 140/67 (!) 148/77 (!) 153/79  Pulse: 77 77 70 66  Resp: (!) '22 18 18 20  '$ Temp:  98  F (36.7 C) 98.1 F (36.7 C) 98.6 F (37 C)  TempSrc:  Oral Oral Oral  SpO2: 94% 99% 97% 96%  Weight:  134.8 kg 134.8 kg   Height:  '5\' 10"'$  (1.778 m)      General: Pt is alert, awake, not in acute distress Cardiovascular: RRR, S1/S2 +, no rubs, no gallops Respiratory: CTA bilaterally, no wheezing, no rhonchi Abdominal: Soft, NT, ND, bowel sounds + Extremities: no edema, no cyanosis    The results of significant diagnostics from this hospitalization (including imaging, microbiology, ancillary and laboratory) are listed below for reference.     Microbiology: Recent Results (from the past 240 hour(s))  Blood culture (routine x 2)     Status: None (Preliminary result)   Collection Time: 07/23/20  7:40 AM   Specimen: BLOOD  Result Value Ref Range Status   Specimen Description BLOOD LEFT ANTECUBITAL  Final   Special Requests   Final    BOTTLES DRAWN AEROBIC AND ANAEROBIC Blood Culture adequate volume   Culture   Final    NO GROWTH < 24 HOURS Performed at [facility], [address] [place], [place] [address]    Report Status PENDING   Incomplete  Blood culture (routine x 2)     Status: None (Preliminary result)   Collection Time: 07/23/20  7:40 AM   Specimen: BLOOD  Result Value Ref Range Status   Specimen Description BLOOD RIGHT ANTECUBITAL  Final   Special Requests   Final    BOTTLES DRAWN AEROBIC AND ANAEROBIC Blood Culture adequate volume   Culture   Final    NO GROWTH < 24 HOURS Performed at [facility], [address] [place], [place] [address]    Report Status PENDING  Incomplete  SARS Coronavirus 2 by RT PCR (hospital order, performed in [place] hospital lab) Nasopharyngeal Nasopharyngeal Swab     Status: None   Collection Time: 07/23/20 12:02 PM   Specimen: Nasopharyngeal Swab  Result Value Ref Range Status   SARS Coronavirus 2 NEGATIVE NEGATIVE Final    Comment: (NOTE) SARS-CoV-2 target nucleic acids are NOT DETECTED.  The SARS-CoV-2 RNA is generally detectable in upper and lower respiratory specimens during the acute phase of infection. The lowest concentration of SARS-CoV-2 viral copies this assay can detect is 250 copies / mL. A negative result does not preclude SARS-CoV-2 infection and should not be used as the sole basis for treatment or other patient management decisions.  A negative result may occur with improper specimen collection / handling, submission of specimen other than nasopharyngeal swab, presence of viral mutation(s) within the areas targeted by this assay, and inadequate number of viral copies (<250 copies / mL). A negative result must be combined with clinical observations, patient history, and epidemiological information.  Fact Sheet for Patients:   StrictlyIdeas.no  Fact Sheet for Healthcare Providers: BankingDealers.co.za  This test is not yet approved or  cleared by the [place] FDA and has been authorized for detection and/or diagnosis of SARS-CoV-2 by FDA under an Emergency Use Authorization (EUA).  This  EUA will remain in effect (meaning this test can be used) for the duration of the COVID-19 declaration under Section 564(b)(1) of the Act, 21 U.S.C. section 360bbb-3(b)(1), unless the authorization is terminated or revoked sooner.  Performed at [facility], [address] [place], [place] [address]      Labs: BNP (last 3 results) Recent Labs    07/23/20 0740  BNP 298.0*   Basic  Metabolic Panel: Recent Labs  Lab 07/23/20 0523 07/23/20 1729 07/24/20 0322  NA 142  --  138  K 3.4*  --  3.4*  CL 102  --  99  CO2 30  --  30  GLUCOSE 228*  --  177*  BUN 36*  --  34*  CREATININE 2.59*  --  2.63*  CALCIUM 9.5  --  9.2  MG  --  2.0 1.9   Liver Function Tests: Recent Labs  Lab 07/23/20 0523  AST 21  ALT 23  ALKPHOS 81  BILITOT 0.9  PROT 7.1  ALBUMIN 4.0   No results for input(s): LIPASE, AMYLASE in the last 168 hours. No results for input(s): AMMONIA in the last 168 hours. CBC: Recent Labs  Lab 07/23/20 0523 07/24/20 0322  WBC 7.5 7.4  NEUTROABS 5.4  --   HGB 14.2 13.6  HCT 43.8 42.0  MCV 84.7 84.5  PLT 241 224   Cardiac Enzymes: No results for input(s): CKTOTAL, CKMB, CKMBINDEX, TROPONINI in the last 168 hours. BNP: Invalid input(s): POCBNP CBG: Recent Labs  Lab 07/23/20 1344 07/23/20 1900 07/23/20 2103 07/24/20 0831  GLUCAP 190* 150* 224* 163*   D-Dimer No results for input(s): DDIMER in the last 72 hours. Hgb A1c Recent Labs    07/24/20 0322  HGBA1C 7.0*   Lipid Profile Recent Labs    07/24/20 0322  CHOL 163  HDL 27*  LDLCALC NOT CALCULATED  TRIG 302*  CHOLHDL 6.0   Thyroid function studies No results for input(s): TSH, T4TOTAL, T3FREE, THYROIDAB in the last 72 hours.  Invalid input(s): FREET3 Anemia work up No results for input(s): VITAMINB12, FOLATE, FERRITIN, TIBC, IRON, RETICCTPCT in the last 72 hours. Urinalysis    Component Value Date/Time   COLORURINE AMBER (A) 07/09/2015 1134   APPEARANCEUR CLEAR  07/09/2015 1134   LABSPEC 1.031 (H) 07/09/2015 1134   PHURINE 5.5 07/09/2015 1134   GLUCOSEU 500 (A) 07/09/2015 1134   HGBUR SMALL (A) 07/09/2015 1134   HGBUR trace-intact 12/07/2008 0854   BILIRUBINUR NEGATIVE 07/09/2015 1134   KETONESUR NEGATIVE 07/09/2015 1134   PROTEINUR 100 (A) 07/09/2015 1134   UROBILINOGEN 4.0 12/07/2008 0854   NITRITE NEGATIVE 07/09/2015 1134   LEUKOCYTESUR NEGATIVE 07/09/2015 1134   Sepsis Labs Invalid input(s): PROCALCITONIN,  WBC,  LACTICIDVEN Microbiology Recent Results (from the past 240 hour(s))  Blood culture (routine x 2)     Status: None (Preliminary result)   Collection Time: 07/23/20  7:40 AM   Specimen: BLOOD  Result Value Ref Range Status   Specimen Description BLOOD LEFT ANTECUBITAL  Final   Special Requests   Final    BOTTLES DRAWN AEROBIC AND ANAEROBIC Blood Culture adequate volume   Culture   Final    NO GROWTH < 24 HOURS Performed at [facility], [address] [place], [place] [address]    Report Status PENDING  Incomplete  Blood culture (routine x 2)     Status: None (Preliminary result)   Collection Time: 07/23/20  7:40 AM   Specimen: BLOOD  Result Value Ref Range Status   Specimen Description BLOOD RIGHT ANTECUBITAL  Final   Special Requests   Final    BOTTLES DRAWN AEROBIC AND ANAEROBIC Blood Culture adequate volume   Culture   Final    NO GROWTH < 24 HOURS Performed at [facility], [address], [place], [place] [address]    Report Status PENDING  Incomplete  SARS Coronavirus 2 by RT PCR (hospital  order, performed in [facility] hospital lab) Nasopharyngeal Nasopharyngeal Swab     Status: None   Collection Time: 07/23/20 12:02 PM   Specimen: Nasopharyngeal Swab  Result Value Ref Range Status   SARS Coronavirus 2 NEGATIVE NEGATIVE Final    Comment: (NOTE) SARS-CoV-2 target nucleic acids are NOT DETECTED.  The SARS-CoV-2 RNA is generally detectable in upper and lower respiratory specimens during  the acute phase of infection. The lowest concentration of SARS-CoV-2 viral copies this assay can detect is 250 copies / mL. A negative result does not preclude SARS-CoV-2 infection and should not be used as the sole basis for treatment or other patient management decisions.  A negative result may occur with improper specimen collection / handling, submission of specimen other than nasopharyngeal swab, presence of viral mutation(s) within the areas targeted by this assay, and inadequate number of viral copies (<250 copies / mL). A negative result must be combined with clinical observations, patient history, and epidemiological information.  Fact Sheet for Patients:   StrictlyIdeas.no  Fact Sheet for Healthcare Providers: BankingDealers.co.za  This test is not yet approved or  cleared by the [place] FDA and has been authorized for detection and/or diagnosis of SARS-CoV-2 by FDA under an Emergency Use Authorization (EUA).  This EUA will remain in effect (meaning this test can be used) for the duration of the COVID-19 declaration under Section 564(b)(1) of the Act, 21 U.S.C. section 360bbb-3(b)(1), unless the authorization is terminated or revoked sooner.  Performed at [facility], [address], [place], [place] [address]      Time coordinating discharge: Over 30 minutes  SIGNED:   Shawna Clamp, MD  [facility] 07/24/2020, 11:26 AM Pager   If 7PM-7AM, please contact night-coverage [email or web address]

## 2020-07-24 NOTE — Progress Notes (Signed)
Patient discharged with all personal belongings. Patient escorted out with volunteer services ambulatory. Discharge instructions and new medications reviewed with patient. Patient has no further questions at this time.

## 2020-07-24 NOTE — Plan of Care (Signed)

## 2020-07-24 NOTE — Progress Notes (Signed)
Admitted to room 236. Pt is alert and orientated x4. Is independent with a steady gait. Orientated to room and staff. Able to voice needs. Takes medication whole, denise any sob or pain at this time. Will continue to monitor.

## 2020-07-24 NOTE — Discharge Instructions (Signed)
Advised to follow-up with primary care physician in 1 week. Advised to follow-up with cardiology as scheduled. Advised to take Zithromax to 250 mg daily for 3 more days to complete 5-day course.

## 2020-07-26 LAB — MYCOPHENOLIC ACID (CELLCEPT)
MPA Glucuronide: 25 ug/mL (ref 15–125)
MPA: 1 ug/mL (ref 1.0–3.5)

## 2020-07-27 LAB — CULTURE, BLOOD (ROUTINE X 2): Special Requests: ADEQUATE

## 2020-07-27 LAB — TACROLIMUS LEVEL: Tacrolimus (FK506) - LabCorp: 2.9 ng/mL (ref 2.0–20.0)

## 2020-07-28 LAB — CULTURE, BLOOD (ROUTINE X 2): Special Requests: ADEQUATE

## 2020-08-02 NOTE — Unmapped (Signed)
[facility] Clinical Assessment & Refill Coordination Note    Richard [name], DOB: 07/12/1965  Phone: [phone] (home)     All above HIPAA information was verified with patient.     Was a Nurse, learning disability used for this call? No    Specialty Medication(s):   Transplant: Myfortic 180mg  and Prograf 0.5mg      Current Outpatient Medications   Medication Sig Dispense Refill   ??? aspirin (ECOTRIN) 81 MG tablet Take 81 mg by mouth daily.     ??? carvediloL (COREG) 6.25 MG tablet 2 tablets two (2) times a day.     ??? CHOLECALCIFEROL, VITAMIN D3, (VITAMIN D3 ORAL) Take 2,000 Units by mouth daily.     ??? furosemide (LASIX) 40 MG tablet 80 mg every morning before breakfast.     ??? hydrALAZINE (APRESOLINE) 25 MG tablet 25 mg Three (3) times a day.     ??? insulin aspart (NOVOLOG FLEXPEN SUBQ) Inject 25-35 Units under the skin Three (3) times a day with a meal. Sliding scale      ??? insulin detemir U-100 (LEVEMIR) 100 unit/mL (3 mL) injection pen Inject 35 Units under the skin.      ??? insulin lispro 100 unit/mL inph Inject 20 Units under the skin daily.     ??? isosorbide mononitrate (IMDUR) 30 MG 24 hr tablet 30 mg every morning before breakfast.     ??? magnesium oxide (MAG-OX) 400 mg (241.3 mg elemental magnesium) tablet Take 400 mg by mouth.     ??? metFORMIN (GLUCOPHAGE) 500 MG tablet Take 500 mg by mouth 2 (two) times a day with meals.     ??? meTOLAzone (ZAROXOLYN) 2.5 MG tablet      ??? multivitamin (TAB-A-VITE/THERAGRAN) per tablet Take 1 tablet by mouth daily.     ??? multivitamin,tx-iron-minerals (COMPLETE MULTIVITAMIN) Tab Take by mouth daily.     ??? mycophenolate (MYFORTIC) 180 MG EC tablet Take 1 tablet (180 mg total) by mouth two (2) times a day. 60 tablet 11   ??? MYFORTIC 180 mg EC tablet Take 1 tablet (180 mg total) by mouth Two (2) times a day. (Patient taking differently: Take 180 mg by mouth Two (2) times a day. Script sent for MAPS for assistance) 180 tablet 3   ??? tacrolimus (PROGRAF) 0.5 MG capsule Take 1 capsule (0.5 mg total) by mouth two (2) times a day. 180 capsule 3   ??? testosterone 20.25 mg/1.25 gram (1.62 %) gel pump PLACE 2 SPRAYS ONTO THE SHOULDER OR UPPER ARM SKIN DAILY.       No current facility-administered medications for this visit.        Changes to medications: Richard [name] reports no changes at this time.   Patient driving and can't go through meds but says no recent changes    Allergies   Allergen Reactions   ??? Insulin Asp Prt-Insulin Aspart      Other reaction(s): Other  Burning on injection and skin reaction.     ??? Insulin Aspart Other (See Comments)     Burning on injection and skin rash (itchy)       Changes to allergies: No    SPECIALTY MEDICATION ADHERENCE     Myfortic 180mg   : 5 days of medicine on hand   Prograf 0.5mg   : 5 days of medicine on hand       Medication Adherence    Patient reported X missed doses in the last month: 0  Specialty Medication: myfortic 180mg   Patient is on additional specialty medications: Yes  Additional Specialty Medications: Prograf 0.5mg   Patient Reported Additional Medication X Missed Doses in the Last Month: 0  Adherence tools used: patient uses a pill box to manage medications          Specialty medication(s) dose(s) confirmed: Regimen is correct and unchanged.     Are there any concerns with adherence? No    Adherence counseling provided? Not needed    CLINICAL MANAGEMENT AND INTERVENTION      Clinical Benefit Assessment:    Do you feel the medicine is effective or helping your condition? Yes    Clinical Benefit counseling provided? Not needed    Adverse Effects Assessment:    Are you experiencing any side effects? No    Are you experiencing difficulty administering your medicine? No    Quality of Life Assessment:    How many days over the past month did your transplant  keep you from your normal activities? For example, brushing your teeth or getting up in the morning. 0    Have you discussed this with your provider? Not needed    Therapy Appropriateness:    Is therapy appropriate? Yes, therapy is appropriate and should be continued    DISEASE/MEDICATION-SPECIFIC INFORMATION      N/A    PATIENT SPECIFIC NEEDS     - Does the patient have any physical, cognitive, or cultural barriers? No    - Is the patient high risk? Yes, patient is taking a REMS drug. Medication is dispensed in compliance with REMS program    - Does the patient require a Care Management Plan? No     - Does the patient require physician intervention or other additional services (i.e. nutrition, smoking cessation, social work)? No      SHIPPING     Specialty Medication(s) to be Shipped:   Transplant: Myfortic 180mg  and Prograf 0.5mg     Other medication(s) to be shipped: No additional medications requested for fill at this time     Changes to insurance: No    Delivery Scheduled: Yes, Expected medication delivery date: 08/04/2020.     Medication will be delivered via UPS to the confirmed prescription address in [facility].  Patient gave me new rx address today, I read it back to him to verify, it is now entered in wam: [address] [place] [address]    The patient will receive a drug information handout for each medication shipped and additional FDA Medication Guides as required.  Verified that patient has previously received a Conservation officer, historic buildings.    All of the patient's questions and concerns have been addressed.    [name]   [facility] Specialty Pharmacist

## 2020-08-03 MED FILL — MYFORTIC 180 MG TABLET,DELAYED RELEASE: ORAL | 30 days supply | Qty: 60 | Fill #3

## 2020-08-03 MED FILL — PROGRAF 0.5 MG CAPSULE: ORAL | 30 days supply | Qty: 60 | Fill #2

## 2020-08-27 NOTE — Unmapped (Signed)
[facility] Refill Coordination Note    Specialty Medication(s) to be Shipped:   Transplant: Myfortic 180mg  and Prograf 0.5mg     Other medication(s) to be shipped: No additional medications requested for fill at this time     Richard [name], DOB: 1966-01-03  Phone: [phone] (home)       All above HIPAA information was verified with patient.     Was a Nurse, learning disability used for this call? No    Completed refill call assessment today to schedule patient's medication shipment from the [facility] [phone]).       Specialty medication(s) and dose(s) confirmed: Regimen is correct and unchanged.   Changes to medications: Marton reports no changes at this time.  Changes to insurance: No  Questions for the pharmacist: No    Confirmed patient received Welcome Packet with first shipment. The patient will receive a drug information handout for each medication shipped and additional FDA Medication Guides as required.       DISEASE/MEDICATION-SPECIFIC INFORMATION        N/A    SPECIALTY MEDICATION ADHERENCE     Medication Adherence    Patient reported X missed doses in the last month: 2  Specialty Medication: Myfortic 180mg   Patient is on additional specialty medications: Yes  Additional Specialty Medications: Prograf 0.5mg   Patient Reported Additional Medication X Missed Doses in the Last Month: 2  Patient is on more than two specialty medications: No  Adherence tools used: patient uses a pill box to manage medications        Myfortic 180 mg: 6 days of medicine on hand   Prograf 0.5 mg: 6 days of medicine on hand     SHIPPING     Shipping address confirmed in Epic.     Delivery Scheduled: Yes, Expected medication delivery date: 08/31/2020.     Medication will be delivered via UPS to the prescription address in Epic WAM.    [name] [facility] Specialty Technician

## 2020-08-30 MED FILL — MYFORTIC 180 MG TABLET,DELAYED RELEASE: ORAL | 30 days supply | Qty: 60 | Fill #4

## 2020-08-30 MED FILL — PROGRAF 0.5 MG CAPSULE: ORAL | 30 days supply | Qty: 60 | Fill #3

## 2020-10-01 MED FILL — MYFORTIC 180 MG TABLET,DELAYED RELEASE: ORAL | 30 days supply | Qty: 60 | Fill #5

## 2020-10-01 MED FILL — PROGRAF 0.5 MG CAPSULE: ORAL | 30 days supply | Qty: 60 | Fill #4

## 2020-10-01 NOTE — Unmapped (Signed)
[facility] Refill Coordination Note     Specialty Medication(s) to be Shipped:   Transplant: Myfortic 180mg  and Prograf 0.5mg     Other medication(s) to be shipped: No additional medications requested for fill at this time     Richard [name], DOB: 1966/04/26  Phone: [phone] (home)       All above HIPAA information was verified with patient.     Was a Nurse, learning disability used for this call? No    Completed refill call assessment today to schedule patient's medication shipment from the [facility] [phone]).       Specialty medication(s) and dose(s) confirmed: Regimen is correct and unchanged.   Changes to medications: Richard [name] reports no changes at this time.  Changes to insurance: No  Questions for the pharmacist: No    Confirmed patient received a Conservation officer, historic buildings and a Surveyor, mining with first shipment. The patient will receive a drug information handout for each medication shipped and additional FDA Medication Guides as required.       DISEASE/MEDICATION-SPECIFIC INFORMATION        N/A    SPECIALTY MEDICATION ADHERENCE     Medication Adherence    Patient reported X missed doses in the last month: 0  Specialty Medication: Myfortic 180mg   Patient is on additional specialty medications: Yes  Additional Specialty Medications: Prograf 0.5mg   Patient Reported Additional Medication X Missed Doses in the Last Month: 0  Patient is on more than two specialty medications: No  Adherence tools used: patient uses a pill box to manage medications                Myfortic 180 mg: 7 days of medicine on hand   Prograf 0.5 mg: 7 days of medicine on hand           SHIPPING     Shipping address confirmed in Epic.     Delivery Scheduled: Yes, Expected medication delivery date: 10/04/20.     Medication will be delivered via UPS to the prescription address in Epic WAM.    Richard [name]   [facility] Specialty Pharmacist

## 2020-10-26 NOTE — Unmapped (Signed)
[facility] Refill Coordination Note    Specialty Medication(s) to be Shipped:   Transplant: Myfortic 180mg  and Prograf 0.5mg     Other medication(s) to be shipped: No additional medications requested for fill at this time     Richard [name], DOB: 30-Apr-1966  Phone: [phone] (home)       All above HIPAA information was verified with patient.     Was a Nurse, learning disability used for this call? No    Completed refill call assessment today to schedule patient's medication shipment from the [facility] [phone]).  All relevant notes have been reviewed.     Specialty medication(s) and dose(s) confirmed: Regimen is correct and unchanged.   Changes to medications: Richard [name] reports no changes at this time.  Changes to insurance: No  New side effects reported not previously addressed with a pharmacist or physician: None reported  Questions for the pharmacist: No    Confirmed patient received a Conservation officer, historic buildings and a Surveyor, mining with first shipment. The patient will receive a drug information handout for each medication shipped and additional FDA Medication Guides as required.       DISEASE/MEDICATION-SPECIFIC INFORMATION        N/A    SPECIALTY MEDICATION ADHERENCE     Medication Adherence    Patient reported X missed doses in the last month: 0  Specialty Medication: Myfortic 180mg   Patient is on additional specialty medications: Yes  Additional Specialty Medications: Prograf 0.5mg   Patient Reported Additional Medication X Missed Doses in the Last Month: 0  Patient is on more than two specialty medications: No  Adherence tools used: patient uses a pill box to manage medications        Were doses missed due to medication being on hold? No    Myfortic 180 mg: 6 days of medicine on hand   Prograf 0.5 mg: 6 days of medicine on hand     REFERRAL TO PHARMACIST     Referral to the pharmacist: Not needed      [facility]     Shipping address confirmed in Epic.     Delivery Scheduled: Yes, Expected medication delivery date: 10/29/2020.     Medication will be delivered via UPS to the prescription address in Epic WAM.    [name] [facility] Specialty Technician

## 2020-10-28 MED FILL — MYFORTIC 180 MG TABLET,DELAYED RELEASE: ORAL | 30 days supply | Qty: 60 | Fill #6

## 2020-10-28 MED FILL — PROGRAF 0.5 MG CAPSULE: ORAL | 30 days supply | Qty: 60 | Fill #5

## 2020-11-24 NOTE — Unmapped (Signed)
[facility] Refill Coordination Note    Specialty Medication(s) to be Shipped:   Transplant: Myfortic 180mg  and Prograf 0.5mg     Other medication(s) to be shipped: No additional medications requested for fill at this time     Richard [name], DOB: 07/18/1965  Phone: [phone] (home)       All above HIPAA information was verified with patient.     Was a Nurse, learning disability used for this call? No    Completed refill call assessment today to schedule patient's medication shipment from the [facility] [phone]).  All relevant notes have been reviewed.     Specialty medication(s) and dose(s) confirmed: Regimen is correct and unchanged.   Changes to medications: Boyd reports no changes at this time.  Changes to insurance: No  New side effects reported not previously addressed with a pharmacist or physician: None reported  Questions for the pharmacist: No    Confirmed patient received a Conservation officer, historic buildings and a Surveyor, mining with first shipment. The patient will receive a drug information handout for each medication shipped and additional FDA Medication Guides as required.       DISEASE/MEDICATION-SPECIFIC INFORMATION        N/A    SPECIALTY MEDICATION ADHERENCE     Medication Adherence    Patient reported X missed doses in the last month: 0  Specialty Medication: Myfortic 180mg   Patient is on additional specialty medications: Yes  Additional Specialty Medications: Prograf 0.5mg   Patient Reported Additional Medication X Missed Doses in the Last Month: 0  Patient is on more than two specialty medications: No  Adherence tools used: patient uses a pill box to manage medications              Were doses missed due to medication being on hold? No    Myfortic 180 mg: 7 days of medicine on hand   Prograf 0.5 mg: 7 days of medicine on hand       REFERRAL TO PHARMACIST     Referral to the pharmacist: Not needed      [facility]     Shipping address confirmed in Epic.     Delivery Scheduled: Yes, Expected medication delivery date: 11/30/20.     Medication will be delivered via UPS to the prescription address in Epic WAM.    Tera Helper   [facility] Specialty Pharmacist

## 2020-11-29 MED FILL — PROGRAF 0.5 MG CAPSULE: ORAL | 30 days supply | Qty: 60 | Fill #6

## 2020-11-29 MED FILL — MYFORTIC 180 MG TABLET,DELAYED RELEASE: ORAL | 30 days supply | Qty: 60 | Fill #7

## 2020-12-21 NOTE — Unmapped (Signed)
[facility] Refill Coordination Note    Specialty Medication(s) to be Shipped:   Transplant: Myfortic 180mg  and Prograf 0.5mg     Other medication(s) to be shipped: No additional medications requested for fill at this time     Richard [name], DOB: 06-Jan-1966  Phone: [phone] (home)       All above HIPAA information was verified with patient.     Was a Nurse, learning disability used for this call? No    Completed refill call assessment today to schedule patient's medication shipment from the [facility] [phone]).  All relevant notes have been reviewed.     Specialty medication(s) and dose(s) confirmed: Regimen is correct and unchanged.   Changes to medications: [name] reports no changes at this time.  Changes to insurance: No  New side effects reported not previously addressed with a pharmacist or physician: None reported  Questions for the pharmacist: No    Confirmed patient received a Conservation officer, historic buildings and a Surveyor, mining with first shipment. The patient will receive a drug information handout for each medication shipped and additional FDA Medication Guides as required.       DISEASE/MEDICATION-SPECIFIC INFORMATION        N/A    SPECIALTY MEDICATION ADHERENCE     Medication Adherence    Patient reported X missed doses in the last month: 0  Specialty Medication: Myfortic 180mg   Patient is on additional specialty medications: Yes  Additional Specialty Medications: Prograf 0.5mg   Patient Reported Additional Medication X Missed Doses in the Last Month: 0  Patient is on more than two specialty medications: No  Adherence tools used: patient uses a pill box to manage medications        Were doses missed due to medication being on hold? No    Myfortic 180 mg: 9 days of medicine on hand   Prograf 0.5 mg: 9 days of medicine on hand     REFERRAL TO PHARMACIST     Referral to the pharmacist: Not needed      [facility]     Shipping address confirmed in Epic.     Delivery Scheduled: Yes, Expected medication delivery date: 12/29/2020.     Medication will be delivered via UPS to the prescription address in Epic WAM.    [name] [facility] Specialty Technician

## 2020-12-28 MED FILL — PROGRAF 0.5 MG CAPSULE: ORAL | 30 days supply | Qty: 60 | Fill #7

## 2020-12-28 MED FILL — MYFORTIC 180 MG TABLET,DELAYED RELEASE: ORAL | 30 days supply | Qty: 60 | Fill #8

## 2021-01-28 NOTE — Unmapped (Signed)
[facility] Clinical Assessment & Refill Coordination Note    Richard [name], DOB: 26-Jan-1966  Phone: [phone] (home)     All above HIPAA information was verified with patient.     Was a Nurse, learning disability used for this call? No    Specialty Medication(s):   Transplant: Myfortic 180mg  and Prograf 0.5mg      Current Outpatient Medications   Medication Sig Dispense Refill   ??? aspirin (ECOTRIN) 81 MG tablet Take 81 mg by mouth daily.     ??? carvediloL (COREG) 6.25 MG tablet 2 tablets two (2) times a day.     ??? CHOLECALCIFEROL, VITAMIN D3, (VITAMIN D3 ORAL) Take 2,000 Units by mouth daily.     ??? furosemide (LASIX) 40 MG tablet 80 mg every morning before breakfast.     ??? hydrALAZINE (APRESOLINE) 25 MG tablet 25 mg Three (3) times a day.     ??? insulin aspart (NOVOLOG FLEXPEN SUBQ) Inject 25-35 Units under the skin Three (3) times a day with a meal. Sliding scale      ??? insulin detemir U-100 (LEVEMIR) 100 unit/mL (3 mL) injection pen Inject 35 Units under the skin.      ??? insulin lispro 100 unit/mL inph Inject 20 Units under the skin daily.     ??? isosorbide mononitrate (IMDUR) 30 MG 24 hr tablet 30 mg every morning before breakfast.     ??? magnesium oxide (MAG-OX) 400 mg (241.3 mg elemental magnesium) tablet Take 400 mg by mouth.     ??? metFORMIN (GLUCOPHAGE) 500 MG tablet Take 500 mg by mouth 2 (two) times a day with meals.     ??? meTOLAzone (ZAROXOLYN) 2.5 MG tablet      ??? multivitamin (TAB-A-VITE/THERAGRAN) per tablet Take 1 tablet by mouth daily.     ??? multivitamin,tx-iron-minerals (COMPLETE MULTIVITAMIN) Tab Take by mouth daily.     ??? mycophenolate (MYFORTIC) 180 MG EC tablet Take 1 tablet (180 mg total) by mouth two (2) times a day. 60 tablet 11   ??? MYFORTIC 180 mg EC tablet Take 1 tablet (180 mg total) by mouth Two (2) times a day. (Patient taking differently: Take 180 mg by mouth Two (2) times a day. Script sent for MAPS for assistance) 180 tablet 3   ??? tacrolimus (PROGRAF) 0.5 MG capsule Take 1 capsule (0.5 mg total) by mouth two (2) times a day. 180 capsule 3     No current facility-administered medications for this visit.        Changes to medications: Levester reports no changes at this time.    Allergies   Allergen Reactions   ??? Insulin Asp Prt-Insulin Aspart      Other reaction(s): Other  Burning on injection and skin reaction.     ??? Insulin Aspart Other (See Comments)     Burning on injection and skin rash (itchy)       Changes to allergies: No    SPECIALTY MEDICATION ADHERENCE     Myfortic 180 mg: 5 days of medicine on hand   Prograf 0.5 mg: 5 days of medicine on hand       Medication Adherence    Patient reported X missed doses in the last month: 0  Specialty Medication: Myfortic 180mg   Patient is on additional specialty medications: Yes  Additional Specialty Medications: Prograf 0.5mg   Patient Reported Additional Medication X Missed Doses in the Last Month: 0  Patient is on more than two specialty medications: No  Adherence tools  used: patient uses a pill box to manage medications          Specialty medication(s) dose(s) confirmed: Regimen is correct and unchanged.     Are there any concerns with adherence? No    Adherence counseling provided? Not needed    CLINICAL MANAGEMENT AND INTERVENTION      Clinical Benefit Assessment:    Do you feel the medicine is effective or helping your condition? Yes    Clinical Benefit counseling provided? Not needed    Adverse Effects Assessment:    Are you experiencing any side effects? No    Are you experiencing difficulty administering your medicine? No    Quality of Life Assessment:         How many days over the past month did your liver transplant  keep you from your normal activities? For example, brushing your teeth or getting up in the morning. 0    Have you discussed this with your provider? Not needed    Acute Infection Status:    Acute infections noted within Epic:  No active infections  Patient reported infection: None    Therapy Appropriateness:    Is therapy appropriate? Yes, therapy is appropriate and should be continued    DISEASE/MEDICATION-SPECIFIC INFORMATION      N/A    PATIENT SPECIFIC NEEDS     - Does the patient have any physical, cognitive, or cultural barriers? No    - Is the patient high risk? Yes, patient is taking a REMS drug. Medication is dispensed in compliance with REMS program    - Does the patient require a Care Management Plan? No     - Does the patient require physician intervention or other additional services (i.e. nutrition, smoking cessation, social work)? No      SHIPPING     Specialty Medication(s) to be Shipped:   Transplant: Myfortic 180mg  and Prograf 0.5mg     Other medication(s) to be shipped: No additional medications requested for fill at this time     Changes to insurance: No    Delivery Scheduled: Yes, Expected medication delivery date: 02/01/21.     Medication will be delivered via UPS to the confirmed prescription address in [facility].    The patient will receive a drug information handout for each medication shipped and additional FDA Medication Guides as required.  Verified that patient has previously received a Conservation officer, historic buildings and a Surveyor, mining.    The patient or caregiver noted above participated in the development of this care plan and knows that they can request review of or adjustments to the care plan at any time.      All of the patient's questions and concerns have been addressed.    Tera Helper   [facility] Specialty Pharmacist

## 2021-01-31 MED FILL — MYFORTIC 180 MG TABLET,DELAYED RELEASE: ORAL | 30 days supply | Qty: 60 | Fill #9

## 2021-01-31 MED FILL — PROGRAF 0.5 MG CAPSULE: ORAL | 30 days supply | Qty: 60 | Fill #8

## 2021-02-23 NOTE — Unmapped (Signed)
[facility] Refill Coordination Note    Specialty Medication(s) to be Shipped:   Transplant: Myfortic 180mg  and Prograf 0.5mg     Other medication(s) to be shipped: No additional medications requested for fill at this time     Richard [name], DOB: [birth year]  Phone: [phone] (home)       All above HIPAA information was verified with patient.     Was a Nurse, learning disability used for this call? No    Completed refill call assessment today to schedule patient's medication shipment from the [facility] [phone]).  All relevant notes have been reviewed.     Specialty medication(s) and dose(s) confirmed: Regimen is correct and unchanged.   Changes to medications: Zen reports no changes at this time.  Changes to insurance: No  New side effects reported not previously addressed with a pharmacist or physician: None reported  Questions for the pharmacist: No    Confirmed patient received a Conservation officer, historic buildings and a Surveyor, mining with first shipment. The patient will receive a drug information handout for each medication shipped and additional FDA Medication Guides as required.       DISEASE/MEDICATION-SPECIFIC INFORMATION        N/A    SPECIALTY MEDICATION ADHERENCE     Medication Adherence    Patient reported X missed doses in the last month: 0  Specialty Medication: MYFORTIC 180 mg EC tablet (Expired)  Patient is on additional specialty medications: Yes  Additional Specialty Medications: tacrolimus (PROGRAF) 0.5 MG capsule  Patient Reported Additional Medication X Missed Doses in the Last Month: 0  Adherence tools used: patient uses a pill box to manage medications              Were doses missed due to medication being on hold? No  Myfortic 180mg    8 days worth of medication on hand.  Prograf 0.5mg   8 days worth of medication on hand.      REFERRAL TO PHARMACIST     Referral to the pharmacist: Not needed      [facility]     Shipping address confirmed in Epic.     Delivery Scheduled: Yes, Expected medication delivery date: 02/25/21.     Medication will be delivered via UPS to the prescription address in Epic WAM.    [place] A Aden Youngman   [facility] Specialty Technician

## 2021-02-24 MED FILL — MYFORTIC 180 MG TABLET,DELAYED RELEASE: ORAL | 30 days supply | Qty: 60 | Fill #10

## 2021-02-24 MED FILL — PROGRAF 0.5 MG CAPSULE: ORAL | 30 days supply | Qty: 60 | Fill #9

## 2021-03-28 NOTE — Unmapped (Signed)
TRF

## 2021-03-29 LAB — VITAMIN D 25 HYDROXY: VITAMIN D, TOTAL (25OH): 63 ng/mL

## 2021-03-29 LAB — COMPREHENSIVE METABOLIC PANEL
BLOOD UREA NITROGEN: 42 mg/dL — AB
CALCIUM: 9.7 mg/dL
CHLORIDE: 103 mmol/L
CO2: 26 mmol/L
CREATININE: 3.41 mg/dL — AB
GLUCOSE RANDOM: 130 mg/dL — AB
POTASSIUM: 4.1 mmol/L
SODIUM: 141 mmol/L

## 2021-03-29 LAB — CBC
HEMATOCRIT: 43.3 %
HEMOGLOBIN: 14.6 g/dL
PLATELET COUNT: 249 10*9/L
WBC ADJUSTED: 6.5 10*9/L

## 2021-03-29 LAB — ALBUMIN: ALBUMIN: 4.1 g/dL

## 2021-03-29 LAB — PHOSPHORUS: PHOSPHORUS: 3.8 mg/dL

## 2021-03-29 LAB — TACROLIMUS LEVEL, TROUGH

## 2021-03-29 NOTE — Unmapped (Signed)
Noted that patient had no recent liver transplant labs in system, but some visible in CE from [facility]. Contacted patient who said he is doing well, but had been seen by his nephrologist Dr. Janit Pagan for his ckd and completed some labs at [facility]. Explained that liver transplant monitoring guidelines have changes and liver txp labs were encouraged quarterly for stable patients. He agreed to repeat labs soon at the [facility] facility. Contacted lab, faxed over labs and confirmed receipt.

## 2021-03-30 NOTE — Unmapped (Signed)
[facility] Refill Coordination Note    Specialty Medication(s) to be Shipped:   Transplant: Myfortic 180mg  and Prograf 0.5mg     Other medication(s) to be shipped: No additional medications requested for fill at this time     Richard [name], DOB: 07-13-1965  Phone: [phone] (home)       All above HIPAA information was verified with patient.     Was a Nurse, learning disability used for this call? No    Completed refill call assessment today to schedule patient's medication shipment from the [facility] [phone]).  All relevant notes have been reviewed.     Specialty medication(s) and dose(s) confirmed: Regimen is correct and unchanged.   Changes to medications: Maksim reports no changes at this time.  Changes to insurance: No  New side effects reported not previously addressed with a pharmacist or physician: None reported  Questions for the pharmacist: No    Confirmed patient received a Conservation officer, historic buildings and a Surveyor, mining with first shipment. The patient will receive a drug information handout for each medication shipped and additional FDA Medication Guides as required.       DISEASE/MEDICATION-SPECIFIC INFORMATION        N/A    SPECIALTY MEDICATION ADHERENCE     Medication Adherence    Patient reported X missed doses in the last month: 0  Specialty Medication: Myfortic 180mg   Patient is on additional specialty medications: Yes  Additional Specialty Medications: Prograf 0.5mg   Patient Reported Additional Medication X Missed Doses in the Last Month: 0  Patient is on more than two specialty medications: No  Adherence tools used: patient uses a pill box to manage medications              Were doses missed due to medication being on hold? No    Prograf 0.5mg   : 5 days of medicine on hand   Myfortic 180mg   : 6 days of medicine on hand     REFERRAL TO PHARMACIST     Referral to the pharmacist: Not needed      [facility]     Shipping address confirmed in Epic.     Delivery Scheduled: Yes, Expected medication delivery date: 04/01/2021.     Medication will be delivered via UPS to the prescription address in Epic WAM.    [name]   [facility] Specialty Pharmacist

## 2021-03-31 MED FILL — PROGRAF 0.5 MG CAPSULE: ORAL | 30 days supply | Qty: 60 | Fill #10

## 2021-03-31 MED FILL — MYFORTIC 180 MG TABLET,DELAYED RELEASE: ORAL | 30 days supply | Qty: 60 | Fill #11

## 2021-04-01 NOTE — Unmapped (Unsigned)
[facility]  [facility]  [address] [place]  [place], [place]  [address]  Ph: [phone]  Fax: [phone]     TRANSPLANT HEPATOLOGY FOLLOW UP CLINIC NOTE    PRIMARY CARE PROVIDER: No PCP Per Patient  Patient Care Team:  None Per Patient Pcp as PCP - General  Genia Harold, RN as Registered Nurse (Transplant)  Joice Lofts Georgeanna Harrison, NP as Nurse Practitioner (Internal Medicine)  Lynnette Caffey, MD as Attending Provider (Nephrology)  Abby Vernie Shanks, MD as Attending Provider (Family Medicine)  Jordan Likes, CPP as Pharmacist (Pharmacist)    DATE OF SERVICE: 04/05/2021    ASSESSMENT AND PLAN:   Richard [name] is a 55 y.o. male who underwent liver transplantation on 03/02/2012 for NASH cirrhosis. PMH includes CKD, morbid obesity, diabetes requiring insulin, and hypertension.     Liver transplant  -No history of rejection or biliary issues.   -recommend better control of metabolic risks factors, he is at high risk of recurrent NASH    Immunosuppression:  -tacrolimus   0.5 mg BID, Goal: 2-4  -Myfortic  180 mg BID          Preventative Health:   - Dermatology: This patient is at increased risk for the development of skin cancers due to immunosuppressant medications. Recommend yearly surveillance.  -DEXA scan: We recommended that patients have bone mineral density testing of the lumbar spine and hips by DEXA every 2 years.   - Colonoscopy: done 08/13/2018 - Repeat in 5-10 years. Until age 61, we recommend routine surveillance.      ***. Vaccinations: We recommend that patients have vaccinations to prevent various infections that can occur, especially in the setting of having underlying liver disease. The following vaccinations should be given:     -Hepatitis A: 10/06/2011, 03/30/2014, +immune  -Hepatitis B: 10/06/2011, 11/06/2011, 03/30/2014, non-immune 2016, needs series  -Influenza (yearly):  -Pneumococcal: PPSV23   06/22/2016, 03/27/2017     and PCV13 05/25/2011, 03/27/2016  -Zoster: Shingrix 03/27/2017, 04/24/2018  -SARS-CoV-2: {TFHSARSVac:90034} recommend series    I spent a majority of my time today with the patient reviewing historical data, old records (including faxed data and data in Care Everywhere), performing a physical examination, and formulating an assessment and plan together with the patient. Patient was discussed with Dr Sherryll Burger and he is in agreement with the plan stated above.     No follow-ups on file.    [name], NP  [facility]  Division of Gastroenterology &Hepatology    Subjective      REASON FOR VISIT: Annual Transplant follow up  HISTORY OF PRESENT ILLNESS:      Richard [name] is a 55 y.o. male who underwent liver transplantation on  03/02/2012 for NASH cirrhosis.        Interval Hx:     Since last visit  On 11/9/20212 ,  he  has not had any hospitalizations, new medical diagnoses, new medications, or infections.   Seen by Nephrology  Seen by Cardiology    Objective      VITAL SIGNS: There were no vitals taken for this visit.  There is no height or weight on file to calculate BMI.  Wt Readings from Last 3 Encounters:   05/04/20 (!) 138.8 kg (306 lb)   05/02/19 (!) 117.9 kg (260 lb)   08/13/18 (!) 117.9 kg (260 lb)     PHYSICAL EXAM:    Vital Signs: There were no vitals taken for  this visit.   Constitutional: He is in no apparent distress.   Eyes: Anicteric sclerae.   Cardiovascular: No peripheral edema.   Gastrointestinal: Soft, nontender abdomen without hepatosplenomegaly, hernias or masses.   Hem/Lymphatic: No cervical or supraclavicular lymphadenopathy.   Musculoskeletal:  No clubbing or cyanosis of hands. Normal range of motion in upper and lower extremities.   Skin: No spider angiomata, palmar erythema, rashes.   Neurologic: Nonfocal, no asterixis.   Psychiatric: Alert and oriented to person, place and time. Normal affect.       DIAGNOSTIC STUDIES:  I have reviewed all pertinent diagnostic studies, including:  Laboratory results:  Lab Results   Component Value Date    WBC 6.5 11/19/2020    HGB 14.6 11/19/2020    MCV 81.4 05/11/2020    PLT 249 11/19/2020    Lab Results   Component Value Date    NA 141 11/19/2020    K 4.1 11/19/2020    CL 103 11/19/2020    CREATININE 3.41 (A) 11/19/2020    GLU 130 (A) 11/19/2020    CALCIUM 9.7 11/19/2020      Lab Results   Component Value Date    ALBUMIN 4.1 11/19/2020    AST 14 05/11/2020    ALT 17 05/11/2020    ALKPHOS 105 05/11/2020    BILITOT 1.1 05/11/2020    Lab Results   Component Value Date    INR 1.08 07/11/2015        Lab Results   Component Value Date    TACROLIMUS  11/19/2020      Comment:      <3.0    TACROLIMUS 4.9 05/11/2020    TACROLIMUS 5.0 05/04/2020    TACROLIMUS 4.8 04/14/2020     Radiographic studies: Individually reviewed  05/04/2020 abdominal US    Overall limited evaluation due to motion artifact and body habitus.  - Patent hepatic transplant vasculature.  - Stable resistive indices in the hepatic transplant arteries, within normal limits.  - Decreased velocities throughout the portal venous system from prior with sluggish main, left, and right posterior portal venous flow. Pulsatile portal venous flow. Continued attention on follow up.  - Mild splenomegaly, unchanged.  - Mildly echogenic kidneys bilaterally, suggestive of medical renal disease. results found.

## 2021-04-05 ENCOUNTER — Ambulatory Visit: Admit: 2021-04-05 | Payer: MEDICAID

## 2021-04-05 NOTE — Unmapped (Signed)
   Complete physical exam  Patient: Richard [name]   DOB: 04/15/1997   55 y.o. Male  MRN: [id number]  Subjective:    No chief complaint on file.   Richard [name] is a 55 y.o. male who presents today for a complete physical exam. She reports consuming a {diet types:17450} diet. {types:19826} She generally feels {DESC; WELL/FAIRLY WELL/POORLY:18703}. She reports sleeping {DESC; WELL/FAIRLY WELL/POORLY:18703}. She {does/does not:200015} have additional problems to discuss today.    Most recent fall risk assessment:    12/21/2021   10:42 AM  Fall Risk   Falls in the past year? 0  Number falls in past yr: 0  Injury with Fall? 0  Risk for fall due to : No Fall Risks  Follow up Falls evaluation completed     Most recent depression screenings:    12/21/2021   10:42 AM 11/11/2020   10:46 AM  PHQ 2/9 Scores  PHQ - 2 Score 0 0  PHQ- 9 Score 5     {VISON DENTAL STD PSA (Optional):27386}  {History (Optional):23778}  Patient Care Team: Jessup, Joy, NP as PCP - General (Nurse Practitioner)   Outpatient Medications Prior to Visit  Medication Sig   fluticasone (FLONASE) 50 MCG/ACT nasal spray Place 2 sprays into both nostrils in the morning and at bedtime. After 7 days, reduce to once daily.   norgestimate-ethinyl estradiol (SPRINTEC 28) 0.25-35 MG-MCG tablet Take 1 tablet by mouth daily.   Nystatin POWD Apply liberally to affected area 2 times per day   spironolactone (ALDACTONE) 100 MG tablet Take 1 tablet (100 mg total) by mouth daily.   No facility-administered medications prior to visit.    ROS        Objective:     There were no vitals taken for this visit. {Vitals History (Optional):23777}  Physical Exam   No results found for any visits on 01/26/22. {Show previous labs (optional):23779}    Assessment & Plan:    Routine Health Maintenance and Physical Exam  Immunization History  Administered Date(s) Administered   DTaP 06/29/1999, 08/25/1999,  11/03/1999, 07/19/2000, 02/02/2004   Hepatitis A 11/29/2007, 12/04/2008   Hepatitis B 04/16/1999, 05/24/1999, 11/03/1999   HiB (PRP-OMP) 06/29/1999, 08/25/1999, 11/03/1999, 07/19/2000   IPV 06/29/1999, 08/25/1999, 04/23/2000, 02/02/2004   Influenza,inj,Quad PF,6+ Mos 03/06/2014   Influenza-Unspecified 06/05/2012   MMR 04/23/2001, 02/02/2004   Meningococcal Polysaccharide 12/04/2011   Pneumococcal Conjugate-13 07/19/2000   Pneumococcal-Unspecified 11/03/1999, 01/17/2000   Tdap 12/04/2011   Varicella 04/23/2000, 11/29/2007    Health Maintenance  Topic Date Due   HIV Screening  Never done   Hepatitis C Screening  Never done   INFLUENZA VACCINE  01/24/2022   PAP-Cervical Cytology Screening  01/26/2022 (Originally 04/14/2020)   PAP SMEAR-Modifier  01/26/2022 (Originally 04/14/2020)   TETANUS/TDAP  01/26/2022 (Originally 12/03/2021)   HPV VACCINES  Discontinued   COVID-19 Vaccine  Discontinued    Discussed health benefits of physical activity, and encouraged her to engage in regular exercise appropriate for her age and condition.  Problem List Items Addressed This Visit   None Visit Diagnoses     Annual physical exam    -  Primary   Cervical cancer screening       Need for Tdap vaccination          No follow-ups on file.     Joy Jessup, NP   

## 2021-04-29 DIAGNOSIS — Z944 Liver transplant status: Principal | ICD-10-CM

## 2021-04-29 DIAGNOSIS — Z796 Long-term use of immunosuppressant medication: Principal | ICD-10-CM

## 2021-04-29 MED ORDER — MYCOPHENOLATE SODIUM 180 MG TABLET,DELAYED RELEASE
ORAL_TABLET | Freq: Two times a day (BID) | ORAL | 1 refills | 30 days | Status: CP
Start: 2021-04-29 — End: ?
  Filled 2021-05-02: qty 60, 30d supply, fill #0

## 2021-04-29 NOTE — Unmapped (Signed)
Pt request for RX Refill    mycophenolate (MYFORTIC) 180 MG EC tablet

## 2021-04-29 NOTE — Unmapped (Signed)
[facility] Refill Coordination Note    Specialty Medication(s) to be Shipped:   Transplant: Myfortic 180mg  and Prograf 0.5mg     Other medication(s) to be shipped: No additional medications requested for fill at this time     Richard [name], DOB: 1965-09-21  Phone: [phone] (home)       All above HIPAA information was verified with patient.     Was a Nurse, learning disability used for this call? No    Completed refill call assessment today to schedule patient's medication shipment from the [facility] [phone]).  All relevant notes have been reviewed.     Specialty medication(s) and dose(s) confirmed: Regimen is correct and unchanged.   Changes to medications: Richard [name] reports no changes at this time.  Changes to insurance: No  New side effects reported not previously addressed with a pharmacist or physician: None reported  Questions for the pharmacist: No    Confirmed patient received a Conservation officer, historic buildings and a Surveyor, mining with first shipment. The patient will receive a drug information handout for each medication shipped and additional FDA Medication Guides as required.       DISEASE/MEDICATION-SPECIFIC INFORMATION        N/A    SPECIALTY MEDICATION ADHERENCE     Medication Adherence    Patient reported X missed doses in the last month: 0  Specialty Medication: Myfortic 180mg   Patient is on additional specialty medications: Yes  Additional Specialty Medications: Prograf 0.5mg   Patient Reported Additional Medication X Missed Doses in the Last Month: 0  Patient is on more than two specialty medications: No  Adherence tools used: patient uses a pill box to manage medications       Were doses missed due to medication being on hold? No    Myfortic 180 mg: 4 days of medicine on hand   Prograf 0.5 mg: 4 days of medicine on hand     REFERRAL TO PHARMACIST     Referral to the pharmacist: Not needed      [facility]     Shipping address confirmed in Epic.     Delivery Scheduled: Yes, Expected medication delivery date: 05/03/2021.     Medication will be delivered via UPS to the prescription address in Epic WAM.    Richard [name] [facility] Specialty Technician

## 2021-05-02 MED FILL — PROGRAF 0.5 MG CAPSULE: ORAL | 30 days supply | Qty: 60 | Fill #11

## 2021-05-24 DIAGNOSIS — Z944 Liver transplant status: Principal | ICD-10-CM

## 2021-05-24 MED ORDER — TACROLIMUS 0.5 MG CAPSULE, IMMEDIATE-RELEASE
ORAL_CAPSULE | Freq: Two times a day (BID) | ORAL | 3 refills | 90 days
Start: 2021-05-24 — End: ?

## 2021-05-24 NOTE — Unmapped (Addendum)
[facility] Refill Coordination Note    Specialty Medication(s) to be Shipped:   Transplant: Myfortic 180mg  and Prograf 0.5mg     Other medication(s) to be shipped: No additional medications requested for fill at this time     Richard [name], DOB: [birth year]  Phone: [phone] (home)       All above HIPAA information was verified with patient.     Was a Nurse, learning disability used for this call? No    Completed refill call assessment today to schedule patient's medication shipment from the [facility] [phone]).  All relevant notes have been reviewed.     Specialty medication(s) and dose(s) confirmed: Regimen is correct and unchanged.   Changes to medications: Richard [name] reports no changes at this time.  Changes to insurance: No  New side effects reported not previously addressed with a pharmacist or physician: None reported  Questions for the pharmacist: No    Confirmed patient received a Conservation officer, historic buildings and a Surveyor, mining with first shipment. The patient will receive a drug information handout for each medication shipped and additional FDA Medication Guides as required.       DISEASE/MEDICATION-SPECIFIC INFORMATION        N/A    SPECIALTY MEDICATION ADHERENCE     Medication Adherence    Patient reported X missed doses in the last month: 0  Specialty Medication: Myfortic 180mg   Patient is on additional specialty medications: Yes  Additional Specialty Medications: Prograf 0.5mg   Patient Reported Additional Medication X Missed Doses in the Last Month: 0  Patient is on more than two specialty medications: No  Adherence tools used: patient uses a pill box to manage medications              Were doses missed due to medication being on hold? No    Myfortic 180 mg: 7 days of medicine on hand   Prograf 0.5 mg: 7 days of medicine on hand       REFERRAL TO PHARMACIST     Referral to the pharmacist: Not needed      [facility]     Shipping address confirmed in Epic.     Delivery Scheduled: Yes, Expected medication delivery date: 05/30/21.     Medication will be delivered via UPS to the prescription address in Epic WAM.    Tera Helper   [facility] Specialty Pharmacist

## 2021-05-25 DIAGNOSIS — Z944 Liver transplant status: Principal | ICD-10-CM

## 2021-05-25 MED ORDER — TACROLIMUS 0.5 MG CAPSULE, IMMEDIATE-RELEASE
ORAL_CAPSULE | Freq: Two times a day (BID) | ORAL | 1 refills | 90.00000 days | Status: CP
Start: 2021-05-25 — End: ?
  Filled 2021-05-27: qty 60, 30d supply, fill #0

## 2021-05-25 NOTE — Unmapped (Signed)
Pt request for RX Refill tacrolimus (PROGRAF) 0.5 MG capsule

## 2021-05-25 NOTE — Unmapped (Signed)
Received refill request for patient's tac. Noted that last measurable tac level was Nov '21. Spoke to patient and explained the importance of obtaining labs at least every 3 mos to assure appropriate tac dosing. He verified he has been taking his tac without missing and agreed to return to the lab tomorrow. He acknowledged awareness that he missed his 11/11 appt and said he would contact [facility] to reschedule. Let him know ltd rx would be sent for tac until labs determine appropriate dosing requirement. He verbalized understanding.

## 2021-05-27 MED FILL — MYFORTIC 180 MG TABLET,DELAYED RELEASE: ORAL | 30 days supply | Qty: 60 | Fill #1

## 2021-06-24 DIAGNOSIS — Z796 Long-term use of immunosuppressant medication: Principal | ICD-10-CM

## 2021-06-24 DIAGNOSIS — Z944 Liver transplant status: Principal | ICD-10-CM

## 2021-06-24 MED ORDER — MYCOPHENOLATE SODIUM 180 MG TABLET,DELAYED RELEASE
ORAL_TABLET | Freq: Two times a day (BID) | ORAL | 1 refills | 30.00000 days
Start: 2021-06-24 — End: ?

## 2021-06-24 NOTE — Unmapped (Signed)
[facility] Clinical Assessment & Refill Coordination Note    BEECHER [name], DOB: Jan 20, 1966  Phone: [phone] (home)     All above HIPAA information was verified with patient.     Was a Nurse, learning disability used for this call? No    Specialty Medication(s):   Transplant:  mycophenolic acid 180mg  and Prograf 0.5mg      Current Outpatient Medications   Medication Sig Dispense Refill   ??? aspirin (ECOTRIN) 81 MG tablet Take 81 mg by mouth daily.     ??? carvediloL (COREG) 6.25 MG tablet 2 tablets two (2) times a day.     ??? CHOLECALCIFEROL, VITAMIN D3, (VITAMIN D3 ORAL) Take 2,000 Units by mouth daily.     ??? furosemide (LASIX) 40 MG tablet 80 mg every morning before breakfast.     ??? hydrALAZINE (APRESOLINE) 25 MG tablet 25 mg Three (3) times a day.     ??? insulin aspart (NOVOLOG FLEXPEN SUBQ) Inject 25-35 Units under the skin Three (3) times a day with a meal. Sliding scale      ??? insulin detemir U-100 (LEVEMIR) 100 unit/mL (3 mL) injection pen Inject 35 Units under the skin.      ??? insulin lispro 100 unit/mL inph Inject 20 Units under the skin daily.     ??? isosorbide mononitrate (IMDUR) 30 MG 24 hr tablet 30 mg every morning before breakfast.     ??? magnesium oxide (MAG-OX) 400 mg (241.3 mg elemental magnesium) tablet Take 400 mg by mouth.     ??? metFORMIN (GLUCOPHAGE) 500 MG tablet Take 500 mg by mouth 2 (two) times a day with meals.     ??? meTOLAzone (ZAROXOLYN) 2.5 MG tablet      ??? multivitamin (TAB-A-VITE/THERAGRAN) per tablet Take 1 tablet by mouth daily.     ??? multivitamin,tx-iron-minerals (COMPLETE MULTIVITAMIN) Tab Take by mouth daily.     ??? mycophenolate (MYFORTIC) 180 MG EC tablet Take 1 tablet (180 mg total) by mouth two (2) times a day. 60 tablet 1   ??? MYFORTIC 180 mg EC tablet Take 1 tablet (180 mg total) by mouth Two (2) times a day. (Patient taking differently: Take 180 mg by mouth Two (2) times a day. Script sent for [employer] for assistance) 180 tablet 3   ??? tacrolimus (PROGRAF) 0.5 MG capsule Take 1 capsule (0.5 mg total) by mouth two (2) times a day. 180 capsule 1     No current facility-administered medications for this visit.        Changes to medications: Culley reports no changes at this time.    Allergies   Allergen Reactions   ??? Insulin Asp Prt-Insulin Aspart      Other reaction(s): Other  Burning on injection and skin reaction.     ??? Insulin Aspart Other (See Comments)     Burning on injection and skin rash (itchy)       Changes to allergies: No    SPECIALTY MEDICATION ADHERENCE     Mycophenolate 180 mg: 7 days of medicine on hand   Prograf 0.5 mg: 7 days of medicine on hand       Medication Adherence    Patient reported X missed doses in the last month: 0  Specialty Medication: Prograf 0.5mg   Patient is on additional specialty medications: Yes  Additional Specialty Medications: Mycophenolate 180mg   Patient Reported Additional Medication X Missed Doses in the Last Month: 0  Patient is on more than two specialty medications: No  Adherence tools used: patient uses a pill box to manage medications          Specialty medication(s) dose(s) confirmed: Regimen is correct and unchanged.     Are there any concerns with adherence? No    Adherence counseling provided? Not needed    CLINICAL MANAGEMENT AND INTERVENTION      Clinical Benefit Assessment:    Do you feel the medicine is effective or helping your condition? Yes    Clinical Benefit counseling provided? Not needed    Adverse Effects Assessment:    Are you experiencing any side effects? No    Are you experiencing difficulty administering your medicine? No    Quality of Life Assessment:         How many days over the past month did your liver transplant  keep you from your normal activities? For example, brushing your teeth or getting up in the morning. depends on the day.     Have you discussed this with your provider? Not needed    Acute Infection Status:    Acute infections noted within Epic:  No active infections  Patient reported infection: None    Therapy Appropriateness:    Is therapy appropriate and patient progressing towards therapeutic goals? Yes, therapy is appropriate and should be continued    DISEASE/MEDICATION-SPECIFIC INFORMATION      N/A    PATIENT SPECIFIC NEEDS     - Does the patient have any physical, cognitive, or cultural barriers? No    - Is the patient high risk? Yes, patient is taking a REMS drug. Medication is dispensed in compliance with REMS program    - Does the patient require a Care Management Plan? No     SOCIAL DETERMINANTS OF HEALTH     At the [facility], we have learned that life circumstances - like trouble affording food, housing, utilities, or transportation can affect the health of many of our patients.   That is why we wanted to ask: are you currently experiencing any life circumstances that are negatively impacting your health and/or quality of life? Patient declined to answer    Social Determinants of Health     Food Insecurity: Not on file   Tobacco Use: Not on file   Transportation Needs: Not on file   Alcohol Use: Not on file   Housing/Utilities: Not on file   Substance Use: Not on file   Financial Resource Strain: Not on file   Physical Activity: Not on file   Health Literacy: Not on file   Stress: Not on file   Intimate Partner Violence: Not on file   Depression: Not on file   Social Connections: Not on file       Would you be willing to receive help with any of the needs that you have identified today? Not applicable       SHIPPING     Specialty Medication(s) to be Shipped:   Transplant:  mycophenolic acid 180mg  and Prograf 0.5mg     Other medication(s) to be shipped: No additional medications requested for fill at this time     Changes to insurance: No    Delivery Scheduled: Yes, Expected medication delivery date: 06/29/21.     Medication will be delivered via UPS to the confirmed prescription address in [facility].    The patient will receive a drug information handout for each medication shipped and additional FDA Medication Guides as required.  Verified that patient has previously received a Conservation officer, historic buildings  and a Surveyor, mining.    The patient or caregiver noted above participated in the development of this care plan and knows that they can request review of or adjustments to the care plan at any time.      All of the patient's questions and concerns have been addressed.    Tera Helper   [facility] Specialty Pharmacist

## 2021-06-27 DIAGNOSIS — Z944 Liver transplant status: Principal | ICD-10-CM

## 2021-06-27 DIAGNOSIS — Z796 Long-term use of immunosuppressant medication: Principal | ICD-10-CM

## 2021-06-28 NOTE — Unmapped (Signed)
The [facility] has received the prescription(s) for Mycophenolate and Prograf. The triage team has completed the benefits investigation and has determined that the patient is NOT able to fill this medication at the [facility] due to insurance plan limitations. Please see additional information below and re-route the prescription to the preferred pharmacy. Thank you.    PA Required: Unable to Determine    Specialty Pharmacy Required:  [facility] - Phone: [phone] and Fax: [phone]     Patient indicated that he usually has it covered through [facility]. His previous claims have been billed to his [employer]) plan, and copay covered by a copay card. Patient requested that we reach out to his coordinator because he doesn't think we're supposed to be running through insurance, and that it's covered by [facility]. Please contact patient to clarify.

## 2021-06-28 NOTE — Unmapped (Signed)
Richard [name] 's entire shipment will be canceled  as a result of no longer being eligible to fill at [facility] pharmacy.     I have reached out to the patient  at [phone] and communicated the delay. We will route a message to clinic staff to inform them of this situation We have canceled this work request.      Patient said that he usually has it covered by [facility]. His previous claim was run through [employer]) plan, and copay covered by a copay card. He said to reach out to his coordinator because it should be covered through [facility].

## 2021-07-07 MED ORDER — MYCOPHENOLATE SODIUM 180 MG TABLET,DELAYED RELEASE
ORAL_TABLET | Freq: Two times a day (BID) | ORAL | 1 refills | 30.00000 days | Status: CP
Start: 2021-07-07 — End: ?

## 2021-07-08 NOTE — Unmapped (Signed)
Left VM for patient, instructing him to complete labs and to contact [facility] to reschedule appt he did not show for in Oct. Explained that no more refills of prograf could be made without lab/visit followup.

## 2021-07-21 LAB — CBC W/ DIFFERENTIAL
BASOPHILS ABSOLUTE COUNT: 0 10*3 (ref 0.0–0.2)
BASOPHILS RELATIVE PERCENT: 0 %
EOSINOPHILS ABSOLUTE COUNT: 0.6 10*3 — ABNORMAL HIGH (ref 0.0–0.5)
EOSINOPHILS RELATIVE PERCENT: 7
HEMATOCRIT: 40.3 % — ABNORMAL LOW (ref 41.5–50.4)
HEMOGLOBIN: 13.9 g/dL — ABNORMAL LOW (ref 14.0–17.5)
LYMPHOCYTES ABSOLUTE COUNT: 1.5 10*3 (ref 1.0–4.8)
LYMPHOCYTES RELATIVE PERCENT: 19 %
MEAN CORPUSCULAR HEMOGLOBIN CONC: 34.5 g/dL (ref 33.0–37.0)
MEAN CORPUSCULAR HEMOGLOBIN: 28.5 pg (ref 27.5–33.2)
MEAN CORPUSCULAR VOLUME: 82.4 FL (ref 80.0–96.0)
MEAN PLATELET VOLUME: 8.2 FL (ref 6.8–10.2)
MONOCYTES ABSOLUTE COUNT: 0 10*3 (ref 0.0–0.8)
MONOCYTES RELATIVE PERCENT: 0 %
NEUTROPHILS ABSOLUTE COUNT: 5.9 10*3 (ref 1.8–7.8)
NEUTROPHILS RELATIVE PERCENT: 74 %
PLATELET COUNT: 224 10*3 (ref 150–450)
RED BLOOD CELL COUNT: 4.89 10*6 (ref 4.50–5.90)
RED CELL DISTRIBUTION WIDTH: 13.2 % (ref 12.3–17.0)
WHITE BLOOD CELL COUNT: 8 10*3 (ref 4.4–11.0)

## 2021-07-21 LAB — PHOSPHORUS: PHOSPHORUS: 2.7 mg/dL (ref 2.5–5.0)

## 2021-07-21 LAB — COMPREHENSIVE METABOLIC PANEL
ALBUMIN: 3.7 g/dL (ref 3.5–5.7)
ALKALINE PHOSPHATASE: 92 IU/L (ref 34–104)
ALT (SGPT): 16 IU/L (ref 7–52)
ANION GAP: 7 MMOL/L (ref 4–14)
AST (SGOT): 20 IU/L (ref 13–39)
BILIRUBIN TOTAL: 0.9 mg/dL (ref 0.0–1.0)
BLOOD UREA NITROGEN: 29 mg/dL — ABNORMAL HIGH (ref 8–24)
CALCIUM: 9.1 mg/dL (ref 8.5–10.5)
CHLORIDE: 103 MMOL/L (ref 98–110)
CO2: 30 MMOL/L (ref 21–31)
CREATININE: 2.77 mg/dL — ABNORMAL HIGH (ref 0.70–1.30)
GLUCOSE RANDOM: 118 mg/dL — ABNORMAL HIGH (ref 70–99)
POTASSIUM: 3.3 MMOL/L — ABNORMAL LOW (ref 3.5–5.3)
PROTEIN TOTAL: 6.1 g/dL — ABNORMAL LOW (ref 6.4–8.9)
SODIUM: 140 MMOL/L (ref 135–146)

## 2021-07-21 LAB — TACROLIMUS LEVEL, TROUGH: TACROLIMUS, TROUGH: <3 ng/mL — ABNORMAL LOW (ref 3.0–15.0)

## 2021-07-21 LAB — GAMMA GT: GAMMA GLUTAMYL TRANSFERASE: 28 IU/L (ref 9–64)

## 2021-07-21 LAB — MAGNESIUM: MAGNESIUM: 2.1 mg/dL (ref 1.9–2.7)

## 2021-07-21 LAB — BILIRUBIN, DIRECT: BILIRUBIN DIRECT: 0.2 mg/dL (ref 0.0–0.2)

## 2021-07-21 LAB — EGFR(MDRD) AF-AM: EGFR MDRD NON AF AMER: 27 mL/min/{1.73_m2} — ABNORMAL LOW

## 2021-07-22 DIAGNOSIS — Z944 Liver transplant status: Principal | ICD-10-CM

## 2021-07-22 DIAGNOSIS — N289 Disorder of kidney and ureter, unspecified: Principal | ICD-10-CM

## 2021-07-22 NOTE — Unmapped (Signed)
Pt message primary coordinator to call. Called pt and explained primary coordinator is out of office but this coordinator can assist in her absence. Pt stated he has new insurance and is interested in being seen by [facility] nephrology as his insurance covers them. Explained that referral will be submmited and pt will be called by nephrology office to schedule. Pt verbalized understanding.

## 2021-07-22 NOTE — Unmapped (Addendum)
Call placed to patient as primary coordinator out of office.  Inquired if 1/20 tac level reflective of a true trough as result was <3 he notes he was told by provider at [facility] he should cut back on tacrolimus to protect his kidneys.  Reviewed his med list shows he has been ordered to be on 0.5mg  BID and with level undetected x 2 now (previous level from 2022) reviewed it would be advisable he increase to ordered dosing on 0.5mg  BID - he agreed.      During call he notes his insurance recently changed and now covers visits @ [place] thus secured visit for him with NP [facility] on 2/9 in which he will repeat labs prior to visit and placed referral to [facility] so he can establish nephrology care via [facility] per his request.

## 2021-07-27 DIAGNOSIS — Z944 Liver transplant status: Principal | ICD-10-CM

## 2021-07-27 DIAGNOSIS — Z796 Long-term use of immunosuppressant medication: Principal | ICD-10-CM

## 2021-07-27 MED ORDER — TACROLIMUS 0.5 MG CAPSULE, IMMEDIATE-RELEASE
ORAL_CAPSULE | Freq: Two times a day (BID) | ORAL | 3 refills | 90.00000 days | Status: CP
Start: 2021-07-27 — End: ?

## 2021-07-27 MED ORDER — MYCOPHENOLATE SODIUM 180 MG TABLET,DELAYED RELEASE
ORAL_TABLET | Freq: Two times a day (BID) | ORAL | 3 refills | 90.00000 days | Status: CP
Start: 2021-07-27 — End: ?

## 2021-07-27 NOTE — Unmapped (Signed)
Per notes from [facility] patient required to fill txp [facility]s [facility] - patient sent MyChart message noting he needs to secure meds - scripted to [facility] and provided him contact #

## 2021-07-28 DIAGNOSIS — Z944 Liver transplant status: Principal | ICD-10-CM

## 2021-07-28 DIAGNOSIS — Z796 Long-term use of immunosuppressant medication: Principal | ICD-10-CM

## 2021-07-28 MED ORDER — MYCOPHENOLATE SODIUM 180 MG TABLET,DELAYED RELEASE
ORAL_TABLET | Freq: Two times a day (BID) | ORAL | 3 refills | 90 days | Status: CP
Start: 2021-07-28 — End: ?

## 2021-07-28 MED ORDER — TACROLIMUS 0.5 MG CAPSULE, IMMEDIATE-RELEASE
ORAL_CAPSULE | Freq: Two times a day (BID) | ORAL | 3 refills | 90 days | Status: CP
Start: 2021-07-28 — End: ?
  Filled 2021-08-01: qty 60, 30d supply, fill #0

## 2021-07-28 NOTE — Unmapped (Signed)
Pt messaged primary coordinator (who is out of office) requesting return call. Called pt who stated his insurance allows him to fill through Shared services. He clarified that the insurance looked at by covering coordinator yesterday was not the correct one. Submitted order and asked pt to call pharmacy to coordinate shipment.

## 2021-07-29 DIAGNOSIS — Z79899 Other long term (current) drug therapy: Principal | ICD-10-CM

## 2021-07-29 DIAGNOSIS — Z944 Liver transplant status: Principal | ICD-10-CM

## 2021-07-29 NOTE — Unmapped (Signed)
[facility] Refill Coordination Note    Specialty Medication(s) to be Shipped:   Transplant: Myfortic 180mg  and Prograf 0.5mg     Other medication(s) to be shipped: No additional medications requested for fill at this time     KRISHNA DANCEL, DOB: 1966-01-21  Phone: [phone] (home)       All above HIPAA information was verified with patient.     Was a Nurse, learning disability used for this call? No    Completed refill call assessment today to schedule patient's medication shipment from the [facility] [phone]).  All relevant notes have been reviewed.     Specialty medication(s) and dose(s) confirmed: Regimen is correct and unchanged.   Changes to medications: Isaul reports no changes at this time.  Changes to insurance: Yes: [employer]  New side effects reported not previously addressed with a pharmacist or physician: None reported  Questions for the pharmacist: No    Confirmed patient received a Conservation officer, historic buildings and a Surveyor, mining with first shipment. The patient will receive a drug information handout for each medication shipped and additional FDA Medication Guides as required.       DISEASE/MEDICATION-SPECIFIC INFORMATION        N/A    SPECIALTY MEDICATION ADHERENCE     Medication Adherence    Patient reported X missed doses in the last month: 0  Specialty Medication: prograf 0.5mg   Patient is on additional specialty medications: Yes  Additional Specialty Medications: Myfortic 180mg   Patient Reported Additional Medication X Missed Doses in the Last Month: 0  Patient is on more than two specialty medications: No  Any gaps in refill history greater than 2 weeks in the last 3 months: no  Demonstrates understanding of importance of adherence: yes  Informant: patient  Reliability of informant: reliable  Provider-estimated medication adherence level: good  Patient is at risk for Non-Adherence: No  Reasons for non-adherence: no problems identified  Adherence tools used: patient uses a pill box to manage medications  Confirmed plan for next specialty medication refill: delivery by pharmacy  Refills needed for supportive medications: not needed          Refill Coordination    Has the Patients' Contact Information Changed: No  Is the Shipping Address Different: No         Were doses missed due to medication being on hold? No    prograf 0.5 mg: 5 days of medicine on hand   myfortic 180 mg: 5 days of medicine on hand         REFERRAL TO PHARMACIST     Referral to the pharmacist: Not needed      [facility]     Shipping address confirmed in Epic.     Delivery Scheduled: Yes, Expected medication delivery date: 02/07.     Medication will be delivered via UPS to the prescription address in Epic [facility].    Antonietta Barcelona   [facility] Specialty Technician

## 2021-08-01 DIAGNOSIS — Z944 Liver transplant status: Principal | ICD-10-CM

## 2021-08-01 MED ORDER — MYCOPHENOLATE MOFETIL 250 MG CAPSULE
ORAL_CAPSULE | Freq: Two times a day (BID) | ORAL | 11 refills | 30 days | Status: CP
Start: 2021-08-01 — End: ?
  Filled 2021-08-01: qty 60, 30d supply, fill #0

## 2021-08-01 NOTE — Unmapped (Signed)
Due to prohibitive cost of myfortic brand/generic, PharmD Charg[facility] recommended he be switched to 250mg  bid of generic cellcept if more affordable. Copay cost at ~$7. Spoke to patient and reassured him this switch was approved by the transplant team. Provided instruction on this medication and requested he repeat labs after he has been on the cellcept for 1-2 wks. He verbalized understanding.

## 2021-08-01 NOTE — Unmapped (Signed)
This onboarding is for the following medication:  1) Cellcept        [facility]   Patient Onboarding/Medication Counseling    Richard [name] is a 56 y.o. male with a liver transplant who I am counseling today on initiation of therapy.  I am speaking to the patient.    Was a Nurse, learning disability used for this call? No    Verified patient's date of birth / HIPAA.    Specialty medication(s) to be sent: Transplant: mycophenolate mofetil Mycophenolate 250mg mg      Non-specialty medications/supplies to be sent: none      Medications not needed at this time: none         Cellcept (mycopheonlate mofetil)    Medication & Administration     Dosage: Take 1 capsule  (250mg ) by mouth two times daily    Administration:   ??? Take by mouth with or without food.   ??? Taking with food can minimize GI side effects.   ??? Swallow capsules whole, do not crush or chew.  ??? Oral suspension should be shaken well prior to administration.  Do not mix with other medications and discard any unused portion 60 days after constitution.      Adherence/Missed dose instructions:  Take a missed dose as soon as you remember it . If it is close to the time of your next dose, skip the missed dose and resume your normal schedule.Never take 2 doses to try and catch up from a missed dose.    Goals of Therapy     Prevent organ rejection    Side Effects & Monitoring Parameters     ??? Feeling tired or weak  ??? Shakiness  ??? Trouble sleeping  ??? Diarrhea, abdominal pain, nausea, vomiting, constipation or decreased appetite  ??? Decreases in blood counts   ??? Back or joint pain  ??? Hypertension or hypotension  ??? High blood sugar  ??? Headache  ??? Skin rash    The following side effects should be reported to the provider:  ??? Reduced immune function - report signs of infection such as fever; chills; body aches; very bad sore throat; ear or sinus pain; cough; more sputum or change in color of sputum; pain with passing urine; wound that will not heal, etc.  Also at a slightly higher risk of some malignancies (mainly skin and blood cancers) due to this reduced immune function.  ??? Allergic reaction (rash, hives, swelling, shortness of breath)  ??? High blood sugar (confusion, feeling sleepy, more thirst, more hungry, passing urine more often, flushing, fast breathing, or breath that smells like fruit)  ??? Electrolyte issues (mood changes, confusion, muscle pain or weakness, a heartbeat that does not feel normal, seizures, not hungry, or very bad upset stomach or throwing up)  ??? High or low blood pressure (bad headache or dizziness, passing out, or change in eyesight)  ??? Kidney issues (unable to pass urine, change in how much urine is passed, blood in the urine, or a big weight gain)  ??? Skin (oozing, heat, swelling, redness, or pain), UTI and other infections   ??? Chest pain or pressure  ??? Abnormal heartbeat  ??? Unexplained bleeding or bruising  ??? Abnormal burning, numbness, or tingling  ??? Muscle cramps,  ??? Yellowing of skin or eyes    Monitoring parameters  ??? Pregnancy   ??? CBC   ??? Renal and hepatic function    Contraindications, Warnings, & Precautions     ??? *This  is a REMS drug and an FDA-approved patient medication guide will be printed with each dispensation  ??? Black Box Warning: Infections   ??? Black Box Warning: Lymphoproliferative disorders - risk of development of lymphoma and skin malignancy is increased  ??? Black Box Warning: Use during pregnancy is associated with increased risks of first trimester pregnancy loss and congenital malformations.   ??? Black Box Warning: Females of reproductive potential should use contraception during treatment and for 6 weeks after therapy is discontinued  ??? Is patient using an effective method of contraception? Patient is aware of warnings.  ??? If yes, method of contraception: Did not say.  ??? CNS depression  ??? New or reactivated viral infections  ??? Neutropenia  ??? Male patients and/or their male partners should use effective contraception during treatment of the male patient and for at least 3 months after last dose.  ??? Breastfeeding is not recommended during therapy and for 6 weeks after last dose    Drug/Food Interactions     ??? Medication list reviewed in Epic. The patient was instructed to inform the care team before taking any new medications or supplements. No drug interactions identified.   ??? Separate doses of antacids and this medication  ??? Check with your doctor before getting any vaccinations    Storage, Handling Precautions, & Disposal     ??? Store at room temperature in a dry place  ??? This medication is considered hazardous. Wash hands after handling and store out of reach or others, including children and pets.        Current Medications (including OTC/herbals), Comorbidities and Allergies     Current Outpatient Medications   Medication Sig Dispense Refill   ??? aspirin (ECOTRIN) 81 MG tablet Take 81 mg by mouth daily.     ??? carvediloL (COREG) 6.25 MG tablet 2 tablets two (2) times a day.     ??? CHOLECALCIFEROL, VITAMIN D3, (VITAMIN D3 ORAL) Take 2,000 Units by mouth daily.     ??? furosemide (LASIX) 40 MG tablet 80 mg every morning before breakfast.     ??? hydrALAZINE (APRESOLINE) 25 MG tablet 25 mg Three (3) times a day.     ??? insulin aspart (NOVOLOG FLEXPEN SUBQ) Inject 25-35 Units under the skin Three (3) times a day with a meal. Sliding scale      ??? insulin detemir U-100 (LEVEMIR) 100 unit/mL (3 mL) injection pen Inject 35 Units under the skin.      ??? insulin lispro 100 unit/mL inph Inject 20 Units under the skin daily.     ??? isosorbide mononitrate (IMDUR) 30 MG 24 hr tablet 30 mg every morning before breakfast.     ??? magnesium oxide (MAG-OX) 400 mg (241.3 mg elemental magnesium) tablet Take 400 mg by mouth.     ??? metFORMIN (GLUCOPHAGE) 500 MG tablet Take 500 mg by mouth 2 (two) times a day with meals.     ??? meTOLAzone (ZAROXOLYN) 2.5 MG tablet      ??? multivitamin (TAB-A-VITE/THERAGRAN) per tablet Take 1 tablet by mouth daily.     ??? multivitamin,tx-iron-minerals (COMPLETE MULTIVITAMIN) Tab Take by mouth daily.     ??? mycophenolate (CELLCEPT) 250 mg capsule Take 1 capsule (250 mg total) by mouth Two (2) times a day. 60 capsule 11   ??? tacrolimus (PROGRAF) 0.5 MG capsule Take 1 capsule (0.5 mg total) by mouth two (2) times a day. 180 capsule 3     No current facility-administered medications for this visit.  Allergies   Allergen Reactions   ??? Insulin Asp Prt-Insulin Aspart      Other reaction(s): Other  Burning on injection and skin reaction.     ??? Insulin Aspart Other (See Comments)     Burning on injection and skin rash (itchy)       Patient Active Problem List   Diagnosis   ??? Hypertension   ??? Acanthosis nigricans   ??? Coagulation defect (CMS-HCC)   ??? Type 2 diabetes mellitus with hyperglycemia, without long-term current use of insulin (CMS-HCC)   ??? Other sequelae of chronic liver disease   ??? Systolic murmur   ??? H/O gastroesophageal reflux (GERD)   ??? Hypogonadism in male   ??? Liver replaced by transplant (CMS-HCC)   ??? Morbid obesity with BMI of 45.0-49.9, adult (CMS-HCC)   ??? Obstructive sleep apnea syndrome   ??? Proteinuria due to type 2 diabetes mellitus (CMS-HCC)   ??? Gastroenteritis   ??? Splenic artery aneurysm (CMS-HCC)   ??? Hiccups   ??? Norovirus   ??? COVID-19   ??? Immunosuppression due to drug therapy (CMS-HCC)   ??? CKD (chronic kidney disease) stage 4, GFR 15-29 ml/min (CMS-HCC)   ??? Morbid obesity (CMS-HCC)       Reviewed and up to date in Epic.    Appropriateness of Therapy     Acute infections noted within Epic:  No active infections  Patient reported infection: None    Is medication and dose appropriate based on diagnosis and infection status? Yes    Prescription has been clinically reviewed: Yes      Baseline Quality of Life Assessment      How many days over the past month did your liver transplant  keep you from your normal activities? For example, brushing your teeth or getting up in the morning. 0    Financial Information     Medication Assistance provided: None Required    Anticipated copay of $7.50 reviewed with patient. Verified delivery address.    Delivery Information     Scheduled delivery date: 08/02/21    Expected start date: 08/02/21    Medication will be delivered via UPS to the prescription address in [facility].  This shipment will not require a signature.      Explained the services we provide at [facility] and that each month we would call to set up refills.  Stressed importance of returning phone calls so that we could ensure they receive their medications in time each month.  Informed patient that we should be setting up refills 7-10 days prior to when they will run out of medication.  A pharmacist will reach out to perform a clinical assessment periodically.  Informed patient that a welcome packet, containing information about our pharmacy and other support services, a Notice of Privacy Practices, and a drug information handout will be sent.      The patient or caregiver noted above participated in the development of this care plan and knows that they can request review of or adjustments to the care plan at any time.      Patient or caregiver verbalized understanding of the above information as well as how to contact the pharmacy at [phone] option 4 with any questions/concerns.  The pharmacy is open Monday through Friday 8:30am-4:30pm.  A pharmacist is available 24/7 via pager to answer any clinical questions they may have.    Patient Specific Needs     - Does the patient have any physical,  cognitive, or cultural barriers? No    - Does the patient have adequate living arrangements? (i.e. the ability to store and take their medication appropriately) Yes    - Did you identify any home environmental safety or security hazards? No    - Patient prefers to have medications discussed with  Patient     - Is the patient or caregiver able to read and understand education materials at a high school level or above? Yes    - Patient's primary language is  English     - Is the patient high risk? Yes, patient is taking a REMS drug. Medication is dispensed in compliance with REMS program      Tera Helper  [facility] Specialty Pharmacist

## 2021-08-01 NOTE — Unmapped (Signed)
Sent new rx for generic cellcept 250mg  bid per PharmD [employer]'s recommendation to assess if cost is more affordable, since patient does not qualify for any myfortic assistance. Script sent to [facility].

## 2021-08-01 NOTE — Unmapped (Signed)
[facility] Specialty Medication Onboarding    Specialty Medication: Mycophenolate 250mg  capsule  Prior Authorization: Not Required   Financial Assistance: No - copay  <$25  Final Copay/Day Supply: $7.50 / 30 days    Insurance Restrictions: Yes - max 1 month supply     Notes to Pharmacist: Switching to generic Cellcept from generic Myfortic due to high copay. Mycophenolate was scheduled to ship out today, 02/06    The triage team has completed the benefits investigation and has determined that the patient is able to fill this medication at [facility]. Please contact the patient to complete the onboarding or follow up with the prescribing physician as needed.

## 2021-08-04 ENCOUNTER — Ambulatory Visit: Admit: 2021-08-04 | Payer: MEDICAID

## 2021-08-04 NOTE — Unmapped (Unsigned)
[facility]  [facility]  [address]  [place], [place]  [address]  Ph: [phone]  Fax: [phone]     TRANSPLANT HEPATOLOGY FOLLOW UP CLINIC NOTE    PRIMARY CARE PROVIDER: No PCP Per Patient  Patient Care Team:  None Per Patient Pcp as PCP - General  Genia Harold, RN as Registered Nurse (Transplant)  Joice Lofts Georgeanna Harrison, NP as Nurse Practitioner (Internal Medicine)  Lynnette Caffey, MD as Attending Provider (Nephrology)  Abby Vernie Shanks, MD as Attending Provider (Family Medicine)  Jordan Likes, CPP as Pharmacist (Pharmacist)    DATE OF SERVICE: 08/04/2021    ASSESSMENT AND PLAN:   Richard [name] is a 56 y.o. male who underwent liver transplantation on 03/02/2012 (Liver)  No history of rejection or biliary issues. 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       Immunosuppression:  -tacrolimus  -mycophenolate 180 mg BID     Stage IV CKD /HTN:  -followed by [facility] (Dr Rolanda Jay)  -***        Preventative Health:   - Dermatology: This patient is at increased risk for the development of skin cancers due to immunosuppressant medications. Recommend yearly surveillance.  -DEXA scan last done: 2013. normal Recommended that patients have bone mineral density testing of the lumbar spine and hips by DEXA every 2 years. Due now. Defer to PCP  - Colonoscopy: done 07/2018. Adenomatous, sessile serrated polyp removed.       Vaccinations: We recommend that patients have vaccinations to prevent various infections that can occur, especially in the setting of having underlying liver disease. The following vaccinations should be given:  -Hepatitis A:   -Hepatitis B:   -Influenza (yearly):  -Pneumococcal: PPSV23 and PCV13  -Zoster: Shingrix (age > 10)  -SARS-CoV-2: {TFHSARSVac:90034}  ***, recommend bivalent COVID vaccine, you can obtain at your local pharmacy or primary care physician    I spent a majority of my time today with the patient reviewing historical data, old records (including faxed data and data in Care Everywhere), performing a physical examination, and formulating an assessment and plan together with the patient.     No follow-ups on file.    [name], NP  [facility]  Division of Gastroenterology &Hepatology      Subjective      REASON FOR VISIT: Annual Transplant follow up  HISTORY OF PRESENT ILLNESS:      Richard [name] is a 56 y.o. male who underwent liver transplantation on 03/02/2012 (Liver) (9 years 5 months ago) for Cirrhosis: Fatty Liver (NASH). No history of rejection or biliary issues. 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     Interval Hx:     Patient was last seen on 05/04/2020. Since that time, he was hospitalized from 5/15-18/2022 for generalized abdominal pain, group b strep bacteremia and splenic infarct from possible septic emboli.   ***    Objective      MEDICATIONS:   Current Outpatient Medications   Medication Instructions   ??? aspirin (ECOTRIN) 81 mg, Daily (standard)   ??? carvediloL (COREG) 6.25 MG tablet 2 tablets, 2 times a day   ??? CHOLECALCIFEROL, VITAMIN D3, (VITAMIN D3 ORAL) 2,000 Units, Daily (standard)   ??? furosemide (LASIX) 80 mg, Daily before breakfast   ???  hydrALAZINE (APRESOLINE) 25 mg, 3 times a day (standard)   ??? insulin aspart (NOVOLOG FLEXPEN SUBQ) 25-35 Units, Subcutaneous, 3 times a day (with meals), Sliding scale   ??? insulin detemir U-100 (LEVEMIR) 35 Units, Subcutaneous   ??? insulin lispro 20 Units, Subcutaneous, Daily   ??? isosorbide mononitrate (IMDUR) 30 mg, Daily before breakfast   ??? magnesium oxide (MAG-OX) 400 mg, Oral   ??? metFORMIN (GLUCOPHAGE) 500 mg, Oral, 2 times a day with meals   ??? meTOLAzone (ZAROXOLYN) 2.5 MG tablet No dose, route, or frequency recorded. ??? multivitamin (TAB-A-VITE/THERAGRAN) per tablet 1 tablet, Oral, Daily (standard)   ??? multivitamin,tx-iron-minerals (COMPLETE MULTIVITAMIN) Tab Daily (standard)   ??? mycophenolate (CELLCEPT) 250 mg, Oral, 2 times a day (standard)   ??? PROGRAF 0.5 mg, Oral, 2 times a day       VITAL SIGNS: There were no vitals taken for this visit.  There is no height or weight on file to calculate BMI.  Wt Readings from Last 3 Encounters:   05/04/20 (!) 138.8 kg (306 lb)   05/02/19 (!) 117.9 kg (260 lb)   08/13/18 (!) 117.9 kg (260 lb)     PHYSICAL EXAM: ***  Constitutional: Alert, no acute distress, with good coloring  HENT: conjunctiva clear, anicteric, nares without discharge, neck supple  CV: Regular rate and rhythm  Lung: respirations even and unlabored  Abdomen: soft, non-distended, non-tender.  No ascites.   Extremities: No edema, well perfused  Neuro: No focal deficits. No asterixis.   Mental Status: Thought organized, appropriate affect, engaged in conversation    DIAGNOSTIC STUDIES:  I have reviewed all pertinent diagnostic studies, including:  Laboratory results:  Lab Results   Component Value Date    WBC 8.0 07/15/2021    HGB 13.9 (L) 07/15/2021    MCV 82.4 07/15/2021    PLT 224 07/15/2021    Lab Results   Component Value Date    NA 140 07/15/2021    K 3.3 (L) 07/15/2021    CL 103 07/15/2021    CREATININE 2.77 (H) 07/15/2021    GLU 118 (H) 07/15/2021    CALCIUM 9.1 07/15/2021      Lab Results   Component Value Date    ALBUMIN 3.7 07/15/2021    AST 20 07/15/2021    ALT 16 07/15/2021    ALKPHOS 92 07/15/2021    BILITOT 0.9 07/15/2021    Lab Results   Component Value Date    INR 1.08 07/11/2015        Lab Results   Component Value Date    HAV POSITIVE 12/19/2011    HBSAG Nonreactive 04/14/2015    HEPBSAB Nonreactive 04/14/2015    HEPBCAB Nonreactive 04/14/2015    HEPCAB Nonreactive 04/14/2015    HIV Nonreactive 07/10/2015       Radiographic studies: Individually reviewed  ***

## 2021-08-16 NOTE — Unmapped (Addendum)
Yesterday left vm for pt re rescheduling annual appt.    Spoke to pt today after calls back and forth and he is rescheduled for 3/9 at 8:00 am. Pt needed early appt only on certain days. He denied need for appt letter.    Pt then shared that he had been hospitalized last week at [facility]. He saw a hematologist and hepatologist and said one of them wanted to change his Myfortic dose. Pt said he was supposed to make an appt in [facility] but was told by [facility] that could only be done at [facility]. He didn't know what that was and this tpa didn't either. Told him this info would be shared with primary coord and she could read about it in system (which pt already knew). He verbalized understanding of all discussed.     No discussion on when to get labs again given hospitalization.

## 2021-08-20 NOTE — Unmapped (Signed)
[facility] Clinical Assessment & Refill Coordination Note    Richard [name], DOB: 1965/09/09  Phone: [phone] (home)     All above HIPAA information was verified with patient.     Was a Nurse, learning disability used for this call? No    Specialty Medication(s):   Transplant: mycophenolate mofetil 250mg  and Prograf 0.5mg      Current Outpatient Medications   Medication Sig Dispense Refill   ??? aspirin (ECOTRIN) 81 MG tablet Take 81 mg by mouth daily.     ??? carvediloL (COREG) 6.25 MG tablet 2 tablets two (2) times a day.     ??? CHOLECALCIFEROL, VITAMIN D3, (VITAMIN D3 ORAL) Take 2,000 Units by mouth daily.     ??? furosemide (LASIX) 40 MG tablet 80 mg every morning before breakfast.     ??? hydrALAZINE (APRESOLINE) 25 MG tablet 25 mg Three (3) times a day.     ??? insulin aspart (NOVOLOG FLEXPEN SUBQ) Inject 25-35 Units under the skin Three (3) times a day with a meal. Sliding scale      ??? insulin detemir U-100 (LEVEMIR) 100 unit/mL (3 mL) injection pen Inject 35 Units under the skin.      ??? insulin lispro 100 unit/mL inph Inject 20 Units under the skin daily.     ??? isosorbide mononitrate (IMDUR) 30 MG 24 hr tablet 30 mg every morning before breakfast.     ??? magnesium oxide (MAG-OX) 400 mg (241.3 mg elemental magnesium) tablet Take 400 mg by mouth.     ??? metFORMIN (GLUCOPHAGE) 500 MG tablet Take 500 mg by mouth 2 (two) times a day with meals.     ??? meTOLAzone (ZAROXOLYN) 2.5 MG tablet      ??? multivitamin (TAB-A-VITE/THERAGRAN) per tablet Take 1 tablet by mouth daily.     ??? multivitamin,tx-iron-minerals (COMPLETE MULTIVITAMIN) Tab Take by mouth daily.     ??? mycophenolate (CELLCEPT) 250 mg capsule Take 1 capsule (250 mg total) by mouth Two (2) times a day. 60 capsule 11   ??? tacrolimus (PROGRAF) 0.5 MG capsule Take 1 capsule (0.5 mg total) by mouth two (2) times a day. 180 capsule 3     No current facility-administered medications for this visit.        Changes to medications: [name] reports no changes at this time.    Allergies   Allergen Reactions   ??? Insulin Asp Prt-Insulin Aspart      Other reaction(s): Other  Burning on injection and skin reaction.     ??? Insulin Aspart Other (See Comments)     Burning on injection and skin rash (itchy)       Changes to allergies: No    SPECIALTY MEDICATION ADHERENCE     Prograf 0.5mg   : 10 days of medicine on hand   Mycophenolate 250mg   : 10 days of medicine on hand       Medication Adherence    Patient reported X missed doses in the last month: 0  Specialty Medication: prograf 0.5mg   Patient is on additional specialty medications: Yes  Additional Specialty Medications: Mycophenolate 250mg   Patient Reported Additional Medication X Missed Doses in the Last Month: 0  Adherence tools used: patient uses a pill box to manage medications          Specialty medication(s) dose(s) confirmed: Regimen is correct and unchanged.     Are there any concerns with adherence? No    Adherence counseling provided? Not needed    CLINICAL MANAGEMENT  AND INTERVENTION      Clinical Benefit Assessment:    Do you feel the medicine is effective or helping your condition? Yes    Clinical Benefit counseling provided? Not needed    Adverse Effects Assessment:    Are you experiencing any side effects? No    Are you experiencing difficulty administering your medicine? No    Quality of Life Assessment:         How many days over the past month did your transplant  keep you from your normal activities? For example, brushing your teeth or getting up in the morning. 0    Have you discussed this with your provider? Not needed    Acute Infection Status:    Acute infections noted within Epic:  No active infections  Patient reported infection: None    Therapy Appropriateness:    Is therapy appropriate and patient progressing towards therapeutic goals? Yes, therapy is appropriate and should be continued    DISEASE/MEDICATION-SPECIFIC INFORMATION      N/A    PATIENT SPECIFIC NEEDS     - Does the patient have any physical, cognitive, or cultural barriers? No    - Is the patient high risk? Yes, patient is taking a REMS drug. Medication is dispensed in compliance with REMS program    - Does the patient require a Care Management Plan? No           SHIPPING     Specialty Medication(s) to be Shipped:   Transplant: mycophenolate mofetil 250mg  and Prograf 0.5mg     Other medication(s) to be shipped: No additional medications requested for fill at this time     Changes to insurance: No    Delivery Scheduled: Yes, Expected medication delivery date: 08/25/2021.     Medication will be delivered via UPS to the confirmed prescription address in [facility].    The patient will receive a drug information handout for each medication shipped and additional FDA Medication Guides as required.  Verified that patient has previously received a Conservation officer, historic buildings and a Surveyor, mining.    The patient or caregiver noted above participated in the development of this care plan and knows that they can request review of or adjustments to the care plan at any time.      All of the patient's questions and concerns have been addressed.    [name]   [facility] Specialty Pharmacist

## 2021-08-22 DIAGNOSIS — E1169 Type 2 diabetes mellitus with other specified complication: Principal | ICD-10-CM

## 2021-08-22 DIAGNOSIS — Z944 Liver transplant status: Principal | ICD-10-CM

## 2021-08-22 DIAGNOSIS — E612 Magnesium deficiency: Principal | ICD-10-CM

## 2021-08-22 DIAGNOSIS — Z5181 Encounter for therapeutic drug level monitoring: Principal | ICD-10-CM

## 2021-08-22 DIAGNOSIS — E119 Type 2 diabetes mellitus without complications: Principal | ICD-10-CM

## 2021-08-22 LAB — BASIC METABOLIC PANEL
BLOOD UREA NITROGEN: 38 mg/dL — AB
CALCIUM: 9.6 mg/dL
CHLORIDE: 97 mmol/L — AB
CO2: 27 mmol/L
CREATININE: 3.55 mg/dL — AB
EGFR CKD-EPI NON-AA MALE: 20 mL/min/{1.73_m2} — AB
GLUCOSE RANDOM: 249 mg/dL — AB
POTASSIUM: 3.3 mmol/L — AB
SODIUM: 136 mmol/L

## 2021-08-22 LAB — CBC: PLATELET COUNT: 197 10*9/L

## 2021-08-22 LAB — ALBUMIN: ALBUMIN: 3.6 g/dL — AB

## 2021-08-22 LAB — HEPATIC FUNCTION PANEL
ALKALINE PHOSPHATASE: 147 U/L — AB
ALT (SGPT): 53 U/L — AB
AST (SGOT): 25 U/L
BILIRUBIN DIRECT: 0.3 mg/dL — AB
BILIRUBIN TOTAL: 1.1 mg/dL — AB
PROTEIN TOTAL: 7 g/dL

## 2021-08-22 LAB — TACROLIMUS LEVEL, TROUGH: TACROLIMUS, TROUGH: 3.3 ng/mL

## 2021-08-22 NOTE — Unmapped (Signed)
Patient spoke with TPA last week and mentioned recent hospitalization with [facility] and need for a f/u appt with [facility], and said his care provider there wanted to change his mfa dose. Contacted patient, who confirmed he was hospitalized mid-Feb, but was not sure for what. Per CE summary, patient went to ED there for c/o RUQ pain and was hospitalized for hypertensive crisis and AKI. Per note, the transition clinic is with [facility], but patient reports he no longer has insurance coverage with [facility], only [facility], so he has not had any f/u since his discharge from [facility]. He also confirmed he is still taking myfortic and has not received any cellcept yet.        Let him know [facility] would make a referral for him with [facility] and to update this coordinator if he has not been contacted within 2 wks for an appt. Also contacted [facility] re.his cellcept Rx and was told they arranged a shipment with him for Wednesday. Patient wanted to get labs this week at a [facility] facility, since he has coverage now, and agreed that [facility] hospital lab would not be too far for him. Placed standing liver txp lab orders w[facility]. Returned call to patient, notifying him of upcoming pharmacy delivery on Wed. He said did remember this arrangement. Encouraged him to contact [facility] with any questions regarding the cellcept dosing. He verbalized understanding and said he aware of upcoming liver txp f/u appt with NP Harms on 3/9.

## 2021-08-24 MED FILL — MYCOPHENOLATE MOFETIL 250 MG CAPSULE: ORAL | 30 days supply | Qty: 60 | Fill #1

## 2021-08-24 MED FILL — PROGRAF 0.5 MG CAPSULE: ORAL | 30 days supply | Qty: 60 | Fill #1

## 2021-08-28 ENCOUNTER — Ambulatory Visit: Admit: 2021-08-28 | Discharge: 2021-09-01 | Payer: PRIVATE HEALTH INSURANCE

## 2021-08-28 ENCOUNTER — Encounter: Admit: 2021-08-28 | Discharge: 2021-09-01 | Payer: PRIVATE HEALTH INSURANCE

## 2021-08-28 LAB — CBC W/ AUTO DIFF
BASOPHILS ABSOLUTE COUNT: 0.1 10*9/L (ref 0.0–0.1)
BASOPHILS RELATIVE PERCENT: 0.6 %
EOSINOPHILS ABSOLUTE COUNT: 0 10*9/L (ref 0.0–0.5)
EOSINOPHILS RELATIVE PERCENT: 0.3 %
HEMATOCRIT: 41.9 % (ref 39.0–48.0)
HEMOGLOBIN: 14.3 g/dL (ref 12.9–16.5)
LYMPHOCYTES ABSOLUTE COUNT: 0.8 10*9/L — ABNORMAL LOW (ref 1.1–3.6)
LYMPHOCYTES RELATIVE PERCENT: 7.2 %
MEAN CORPUSCULAR HEMOGLOBIN CONC: 34 g/dL (ref 32.0–36.0)
MEAN CORPUSCULAR HEMOGLOBIN: 27.6 pg (ref 25.9–32.4)
MEAN CORPUSCULAR VOLUME: 81.3 fL (ref 77.6–95.7)
MEAN PLATELET VOLUME: 7.4 fL (ref 6.8–10.7)
MONOCYTES ABSOLUTE COUNT: 0.6 10*9/L (ref 0.3–0.8)
MONOCYTES RELATIVE PERCENT: 5.3 %
NEUTROPHILS ABSOLUTE COUNT: 9.9 10*9/L — ABNORMAL HIGH (ref 1.8–7.8)
NEUTROPHILS RELATIVE PERCENT: 86.6 %
PLATELET COUNT: 263 10*9/L (ref 150–450)
RED BLOOD CELL COUNT: 5.16 10*12/L (ref 4.26–5.60)
RED CELL DISTRIBUTION WIDTH: 13 % (ref 12.2–15.2)
WBC ADJUSTED: 11.4 10*9/L — ABNORMAL HIGH (ref 3.6–11.2)

## 2021-08-28 LAB — HIGH SENSITIVITY TROPONIN I - SINGLE
HIGH SENSITIVITY TROPONIN I: 213 ng/L (ref <=53)
HIGH SENSITIVITY TROPONIN I: 205 ng/L (ref <=53)
HIGH SENSITIVITY TROPONIN I: 207 ng/L (ref <=53)

## 2021-08-28 LAB — COMPREHENSIVE METABOLIC PANEL
ALBUMIN: 3.8 g/dL (ref 3.4–5.0)
ALKALINE PHOSPHATASE: 112 U/L (ref 46–116)
ALT (SGPT): 37 U/L (ref 10–49)
ANION GAP: 10 mmol/L (ref 5–14)
AST (SGOT): 42 U/L — ABNORMAL HIGH (ref <=34)
BILIRUBIN TOTAL: 1.1 mg/dL (ref 0.3–1.2)
BLOOD UREA NITROGEN: 33 mg/dL — ABNORMAL HIGH (ref 9–23)
BUN / CREAT RATIO: 10
CALCIUM: 9.9 mg/dL (ref 8.7–10.4)
CHLORIDE: 103 mmol/L (ref 98–107)
CO2: 31 mmol/L (ref 20.0–31.0)
CREATININE: 3.25 mg/dL — ABNORMAL HIGH
EGFR CKD-EPI (2021) MALE: 22 mL/min/{1.73_m2} — ABNORMAL LOW (ref >=60)
GLUCOSE RANDOM: 187 mg/dL — ABNORMAL HIGH (ref 70–179)
POTASSIUM: 4.1 mmol/L (ref 3.4–4.8)
PROTEIN TOTAL: 8 g/dL (ref 5.7–8.2)
SODIUM: 144 mmol/L (ref 135–145)

## 2021-08-28 LAB — BLOOD GAS, VENOUS
BASE EXCESS VENOUS: 4.5 — ABNORMAL HIGH (ref -2.0–2.0)
HCO3 VENOUS: 30 mmol/L — ABNORMAL HIGH (ref 22–27)
O2 SATURATION VENOUS: 77.7 % (ref 40.0–85.0)
PCO2 VENOUS: 45 mmHg (ref 40–60)
PH VENOUS: 7.43 (ref 7.32–7.43)
PO2 VENOUS: 40 mmHg (ref 30–55)

## 2021-08-28 LAB — PROTIME-INR
INR: 1.02
PROTIME: 11.5 s (ref 9.8–12.8)

## 2021-08-28 LAB — APTT
APTT: 23.4 s — ABNORMAL LOW (ref 25.1–36.5)
HEPARIN CORRELATION: <0.2

## 2021-08-28 LAB — LIPASE: LIPASE: 35 U/L (ref 12–53)

## 2021-08-28 LAB — B-TYPE NATRIURETIC PEPTIDE: B-TYPE NATRIURETIC PEPTIDE: 387 pg/mL — ABNORMAL HIGH (ref <=100)

## 2021-08-28 NOTE — Unmapped (Signed)
Patient complaining of SOB and CP that started this am

## 2021-08-28 NOTE — Unmapped (Signed)
[facility]  Emergency Department Medical Screening Examination     Subjective     Richard [name] is a 56 y.o. male presenting for evaluation of Shortness of Breath. Patient reports couple weeks of intermittent lower abdominal pain with associated chest tightness, vomiting, bowel urgency, feet swelling, and shortness of breath that has acutely worsened. He notes he had a liver transplant here in 2013. He notes his pain is similar to prior to his liver transplant.     Abbreviated Review of Systems/Covid Screen  Constitutional: Negative for fever  Respiratory: Negative for cough. Negative for difficulty breathing.    Objective     ED Triage Vitals   Enc Vitals Group      BP --       Heart Rate 08/28/21 1437 78      SpO2 Pulse --       Resp 08/28/21 1455 20      Temp 08/28/21 1455 36.6 ??C (97.9 ??F)      Temp Source 08/28/21 1455 Oral      SpO2 08/28/21 1437 96 %     Focused Physical Exam  Constitutional: No acute distress.  Respiratory: Non-labored respirations.  Neurological: Clear speech. No gross focal neurologic deficits are appreciated.  ?  Assessment & Plan     Appropriate ED triage orders placed. Will defer comprehensive evaluation, assessment, and disposition to main ED team once bed becomes available.     A medical screening exam has been performed. At the time of this evaluation, no emergency medical condition requiring immediate stabilization has been identified nor is there suspicion for imminent decompensation. Appropriate triage protocols will be implemented and a comprehensive ED evaluation with disposition will be completed by a healthcare provider when an appropriate ED location becomes available. The patient is aware that this is an initial encounter only and verbalizes understanding and agreement with the plan.     Emergency Department operations continue to be impacted by the COVID-19 pandemic.    Documentation assistance was provided by Billie Ruddy, Scribe on August 28, 2021 at 2:54 PM for Rickard Rhymes, MD.    Documentation assistance was provided by the scribe in my presence.  The documentation recorded by the scribe has been reviewed by me and accurately reflects the services I personally performed.

## 2021-08-28 NOTE — Unmapped (Signed)
Pt reports worsening abdominal pain with subsequent chest tightness and shortness of breath 3-4x/week for past few weeks. Endorses n/v. Hx of liver transplant, says abdominal pain feels similar to that prior to his transplant.

## 2021-08-29 LAB — LIPID PANEL
CHOLESTEROL/HDL RATIO SCREEN: 4.7 — ABNORMAL HIGH (ref 1.0–4.5)
CHOLESTEROL: 164 mg/dL (ref <=200)
HDL CHOLESTEROL: 35 mg/dL — ABNORMAL LOW (ref 40–60)
LDL CHOLESTEROL CALCULATED: 95 mg/dL (ref 40–99)
NON-HDL CHOLESTEROL: 129 mg/dL (ref 70–130)
TRIGLYCERIDES: 169 mg/dL — ABNORMAL HIGH (ref 0–150)
VLDL CHOLESTEROL CAL: 33.8 mg/dL (ref 12–47)

## 2021-08-29 LAB — CBC W/ AUTO DIFF
BASOPHILS ABSOLUTE COUNT: 0 10*9/L (ref 0.0–0.1)
BASOPHILS RELATIVE PERCENT: 0.4 %
EOSINOPHILS ABSOLUTE COUNT: 0.2 10*9/L (ref 0.0–0.5)
EOSINOPHILS RELATIVE PERCENT: 2.7 %
HEMATOCRIT: 35.5 % — ABNORMAL LOW (ref 39.0–48.0)
HEMOGLOBIN: 12.3 g/dL — ABNORMAL LOW (ref 12.9–16.5)
LYMPHOCYTES ABSOLUTE COUNT: 1.3 10*9/L (ref 1.1–3.6)
LYMPHOCYTES RELATIVE PERCENT: 21.4 %
MEAN CORPUSCULAR HEMOGLOBIN CONC: 34.6 g/dL (ref 32.0–36.0)
MEAN CORPUSCULAR HEMOGLOBIN: 28.1 pg (ref 25.9–32.4)
MEAN CORPUSCULAR VOLUME: 81 fL (ref 77.6–95.7)
MEAN PLATELET VOLUME: 7.4 fL (ref 6.8–10.7)
MONOCYTES ABSOLUTE COUNT: 0.5 10*9/L (ref 0.3–0.8)
MONOCYTES RELATIVE PERCENT: 7.3 %
NEUTROPHILS ABSOLUTE COUNT: 4.2 10*9/L (ref 1.8–7.8)
NEUTROPHILS RELATIVE PERCENT: 68.2 %
PLATELET COUNT: 206 10*9/L (ref 150–450)
RED BLOOD CELL COUNT: 4.38 10*12/L (ref 4.26–5.60)
RED CELL DISTRIBUTION WIDTH: 12.9 % (ref 12.2–15.2)
WBC ADJUSTED: 6.2 10*9/L (ref 3.6–11.2)

## 2021-08-29 LAB — COMPREHENSIVE METABOLIC PANEL
ALBUMIN: 3.2 g/dL — ABNORMAL LOW (ref 3.4–5.0)
ALKALINE PHOSPHATASE: 131 U/L — ABNORMAL HIGH (ref 46–116)
ALT (SGPT): 196 U/L — ABNORMAL HIGH (ref 10–49)
ANION GAP: 9 mmol/L (ref 5–14)
AST (SGOT): 311 U/L — ABNORMAL HIGH (ref <=34)
BILIRUBIN TOTAL: 1.9 mg/dL — ABNORMAL HIGH (ref 0.3–1.2)
BLOOD UREA NITROGEN: 37 mg/dL — ABNORMAL HIGH (ref 9–23)
BUN / CREAT RATIO: 11
CALCIUM: 8.7 mg/dL (ref 8.7–10.4)
CHLORIDE: 103 mmol/L (ref 98–107)
CO2: 30 mmol/L (ref 20.0–31.0)
CREATININE: 3.48 mg/dL — ABNORMAL HIGH
EGFR CKD-EPI (2021) MALE: 20 mL/min/{1.73_m2} — ABNORMAL LOW (ref >=60)
GLUCOSE RANDOM: 115 mg/dL (ref 70–179)
POTASSIUM: 3.4 mmol/L (ref 3.4–4.8)
PROTEIN TOTAL: 6.5 g/dL (ref 5.7–8.2)
SODIUM: 142 mmol/L (ref 135–145)

## 2021-08-29 LAB — URINALYSIS WITH MICROSCOPY WITH CULTURE REFLEX
BILIRUBIN UA: NEGATIVE
GLUCOSE UA: 300 — AB
HYALINE CASTS: 1 /LPF (ref 0–1)
KETONES UA: NEGATIVE
LEUKOCYTE ESTERASE UA: NEGATIVE
NITRITE UA: NEGATIVE
PH UA: 6.5 (ref 5.0–9.0)
PROTEIN UA: 300 — AB
RBC UA: 1 /HPF (ref <=3)
SPECIFIC GRAVITY UA: 1.012 (ref 1.003–1.030)
SQUAMOUS EPITHELIAL: <1 /HPF (ref 0–5)
UROBILINOGEN UA: <2
WBC UA: 6 /HPF — ABNORMAL HIGH (ref <=2)

## 2021-08-29 LAB — ACETAMINOPHEN LEVEL: ACETAMINOPHEN LEVEL: <2 ug/mL (ref <=20.0)

## 2021-08-29 LAB — HEMOGLOBIN A1C
ESTIMATED AVERAGE GLUCOSE: 192 mg/dL
HEMOGLOBIN A1C: 8.3 % — ABNORMAL HIGH (ref 4.8–5.6)

## 2021-08-29 LAB — BILIRUBIN, DIRECT: BILIRUBIN DIRECT: 0.9 mg/dL — ABNORMAL HIGH (ref 0.00–0.30)

## 2021-08-29 LAB — LACTATE, VENOUS, WHOLE BLOOD: LACTATE BLOOD VENOUS: 1.7 mmol/L (ref 0.5–1.8)

## 2021-08-29 LAB — ETHANOL: ETHANOL: <10 mg/dL (ref <=10.0)

## 2021-08-29 LAB — HIGH SENSITIVITY TROPONIN I - SINGLE: HIGH SENSITIVITY TROPONIN I: 200 ng/L (ref <=53)

## 2021-08-29 LAB — TSH: THYROID STIMULATING HORMONE: 4.009 u[IU]/mL (ref 0.550–4.780)

## 2021-08-29 LAB — TACROLIMUS LEVEL, TROUGH: TACROLIMUS, TROUGH: 2.1 ng/mL — ABNORMAL LOW (ref 5.0–15.0)

## 2021-08-29 LAB — CK: CREATINE KINASE TOTAL: 264 U/L — ABNORMAL HIGH

## 2021-08-29 MED ADMIN — isosorbide mononitrate (IMDUR) 24 hr tablet 60 mg: 60 mg | ORAL | @ 14:00:00

## 2021-08-29 MED ADMIN — potassium chloride CR tablet 40 mEq: 40 meq | ORAL | @ 17:00:00 | Stop: 2021-08-29

## 2021-08-29 MED ADMIN — oxyCODONE (ROXICODONE) immediate release tablet 5 mg: 5 mg | ORAL | @ 22:00:00 | Stop: 2021-09-12

## 2021-08-29 MED ADMIN — hydrALAZINE (APRESOLINE) tablet 25 mg: 25 mg | ORAL | @ 22:00:00

## 2021-08-29 MED ADMIN — HYDROmorphone (PF) (DILAUDID) injection 1 mg: 1 mg | INTRAVENOUS | @ 23:00:00 | Stop: 2021-08-29

## 2021-08-29 MED ADMIN — aspirin chewable tablet 81 mg: 81 mg | ORAL | @ 14:00:00

## 2021-08-29 MED ADMIN — amLODIPine (NORVASC) tablet 10 mg: 10 mg | ORAL | @ 14:00:00

## 2021-08-29 MED ADMIN — MORPhine 4 mg/mL injection 8 mg: 8 mg | INTRAVENOUS | @ 04:00:00 | Stop: 2021-08-28

## 2021-08-29 MED ADMIN — tacrolimus (PROGRAF) capsule 0.5 mg: .5 mg | ORAL | @ 14:00:00

## 2021-08-29 MED ADMIN — fentaNYL (PF) (SUBLIMAZE) injection 50 mcg: 50 ug | INTRAVENOUS | @ 05:00:00 | Stop: 2021-08-28

## 2021-08-29 MED ADMIN — ondansetron (ZOFRAN) injection 4 mg: 4 mg | INTRAVENOUS | @ 04:00:00 | Stop: 2021-08-28

## 2021-08-29 MED ADMIN — aspirin tablet 325 mg: 325 mg | ORAL | @ 04:00:00 | Stop: 2021-08-28

## 2021-08-29 MED ADMIN — insulin lispro (HumaLOG) injection 0-20 Units: 0-20 [IU] | SUBCUTANEOUS | @ 22:00:00

## 2021-08-29 MED ADMIN — hydrALAZINE (APRESOLINE) tablet 25 mg: 25 mg | ORAL | @ 17:00:00

## 2021-08-29 MED ADMIN — mycophenolate (CELLCEPT) capsule 250 mg: 250 mg | ORAL | @ 14:00:00

## 2021-08-29 MED ADMIN — hydrALAZINE (APRESOLINE) tablet 25 mg: 25 mg | ORAL | @ 14:00:00

## 2021-08-29 MED ADMIN — oxyCODONE (ROXICODONE) immediate release tablet 5 mg: 5 mg | ORAL | @ 20:00:00 | Stop: 2021-08-29

## 2021-08-29 MED ADMIN — haloperidol lactate (HALDOL) injection 5 mg: 5 mg | INTRAVENOUS | @ 05:00:00 | Stop: 2021-08-29

## 2021-08-29 NOTE — Unmapped (Signed)
ED Progress Note    Notified of hsTrop 213. No priors. BNP of 387. Also no priors.    I reviewed the patient's EKG. My interpretation:    Sinus rhythm with first degree AV block. RBBB and LAFB. No ST segment elevation or depression. Nonspecific ST segment changes, especially in the precordial leads.    I notified the charge nurse to have the patient roomed next. Will order ASA, repeat EKG, and trended troponins.

## 2021-08-29 NOTE — Unmapped (Signed)
DIVISION OF CARDIOLOGY  [facility], [place]        Date of Service: 08/29/21      CARDIOLOGY INITIAL CONSULT  NOTE    Requesting Physician: Sallyanne Havers, MD   Requesting Service: Med General Doristine Counter (MDU)   Consulting Resident: Mary Sella. Mariama Saintvil, MD, PhD     [name] is a 56 y.o. male with a PMHx liver transplant (2013 2/2 NASH cirrhosis), HTN, prior diagnosis of HFpEF (EF 61% 07/2021), OSA, DM2, and CKD4 who presented with chest tightness, suprapubic pain, found to have elevated troponin and AST/ALT. Cardiology consulted for elevated troponins in the setting of chest pain and hypertension.    Assessment/Plan:  Tropionemia - Chest Pain - prior diagnosis of HFpEF (EF 61% 07/2021) - Volume Overload  EKG reassuring and stable to prior, strongly suggesting no active ACS. Given cardiac history and previous wall motion abnormalities noted at [facility], would be prudent to re-evaluate cardiac function with TTE while inpatient. Strong suspicion that elevated troponins are 2/2 to hypertension and demand ischemia. Slow decline is likely 2/2 to impaired renal clearance in the setting of the CKD4. BNP elevation, which is likely artificially low 2/2 BMI, suggests volume overload, which is not strongly support by exam. BNP elevation may be a consequence of renal dysfunction. Would benefit from gentle diuresis as part of a larger anti-hypertensive regimen.  -agree with TTE for further evaluation of wall motion abnormalities documented at OSH and to evaluate for additional wall motion abnormalities  -recommend no longer necessary to trend troponins unless chest pain represents  -agree with continuing ASA 81 mg PO QD  -consider statin pending hepatology approval given underlying risk factors for CAD  -recommend collecting lipid panel to better evaluate CAD risk  -would likely benefit from outpatient cardiology follow up. Given underlying risk factors, would benefit from risk/benefit analysis of cath in the future. There is NO indication for acute catheterization at this time.    Resistant HTN  Has experienced multipel episodes of hypertensive emergency/crisis in the past month, necessitating medical optimization this admission. Will ultimately defer to primary team for management. There is no contraindication for beta blockade as part of his anti-hypertensive regimen at this time. Given history of transient edema in BLEs, there may be a role for gentle PO diuresis as part of his antihypertensive regimen.    DM2 - A1c 8.3  Will defer to primary team for management.    Elevated LFTs  While suspicion is that this is 2/2 to hypertension, will defer to primary team and hepatology.        Diagnoses addressed on this consultation:  Type 2 MI, acute.       I discussed the plan with the primary team via phone    Thank you for this consult. This patient was seen and discussed with the consult attending. Please see attending attestation for further insights into management. If any further questions arise please page the cardiology consult pager [phone]) Monday - Friday from 8AM-5PM or the on-call cardiology pager [phone]) nights and weekends.       Subjective:   [name] is a 56 y.o. male with a PMHx liver transplant (2013 2/2 NASH cirrhosis), HTN, OSA, DM2, prior diagnosis of HFpEF (EF 61% 07/2021), and CKD4 who presented with chest tightness, suprapubic pain, found to have elevated troponin and AST/ALT. Cardiology consulted for elevated troponins in the setting of chest pain and hypertensive emergency.    [name] has  limited prior cardiac history available per chart review.    Per chart review, [name] was recently admitted to [facility] 08/05/21 with shortness of breath and RUQ pain, found to be hypertensive to SBP 220s, with symptoms resolving after initiating a nicardipine drip. Troponin was elevated to 252 in addition to new ST depressions in V1-V4 on ECG. Subsequent TTE noted hypokinesis of the basal-mid anteroseptal, basal-mid inferoseptal, and basal-mid inferior myocardium. Cardiology consulted, who recommended stress MRI, which was deferred due to body habitus. Was arranged for outpatient follow-up. Additional workup with CTA chest and CTAP w IV con unremarkable. Also noted to have incidental elevated AST/ALT that ultimately resolved with anti-hypertensive management. Discharged with amlodipine 10 mg PO every day in addition to his home regimen on hydralazine and IMDUR. Furosemide was discontinued at discharge.    [name] again presented on 08/28/2021 once again with chest pain and tightness that abruptly started yesterday and suprapubic pain. This pain has been intermittently present for the past few days. At presentation, afebrile and hypertensive to 170s and 190s systolic. Sating 98% on 2 L [place]. Blood pressures better since resuming amlodipine 10 mg PO. Labs notable for S Cr 3.48 (BL since 2/14). AST/ALT/ALP increased 42/37/112 -> 311/196/131. Troponin elevated at 213 -> 200 overnight. BNP 387. EKG discussed below. CXR NAI. Liver US transplant unremarkable with patent vasculature and normal resistive indices. CT AP w/o IV con notable for multiple R sided nonobstructive renal calculi without hydronephrosis.    Seen at bedside. Denies chest pain and SOB. Primary concern is abdominal pain (8/10) that causes inability to rest comfortable and causes nausea/vomiting. His pain waxes/wanes and occurs at rest and when active. Until his abdominal pain, he has been able to complete activities of daily living without issue. He walked up two flights of stairs 2 weeks prior without pain or chest discomfort. Confirmed that he takes IMDUR, hydralazine, and amlodipine as part of his anti-hypertensive regimen. Denies taking Coreg, metolazone, and furosemide.         Cardiovascular History:  ?? None per chart review    Pertinent Medications:  ??? Hydralazine 25 mg PO TID  ??? IMDUR 30 mg PO every day at home, now 60 mg PO QD  ??? Furosemide 80 mg PO QAM (discontinued 08/09/21)  ??? Amlodipine 10 mg PO every day (started 08/09/2021)  ??? ASA 81 mg every day         Objective:    BP 163/90  - Pulse 85  - Temp 36.4 ??C (97.6 ??F) (Oral)  - Resp 20  - SpO2 95%        General: Alert, in distress  HEENT: Overall benign  Cardiac: tachycardic, regular rhythm, 1/6 holosystolic murmur RUSB. JVP at clavicle. Radial pulses 2+ b/l.  Pulmonary: Bibasilar crackles, no increased work of breathing  Abdomen: Soft, exquisitely tender to palpation localized to RUQ, no rebound tenderness, obese.   Extremities: Trace pre-tibial pitting edema bilaterally. Legs are warm  Neuro: Alert and oriented. No focal deficits      I have reviewed Pertinent Notes from the primary service, with patient evaluation including physical exam, Pertinent Labs, including TTE, CTAP noncon and Chest X-ray(s)       TTE from [facility] 08/08/2021:   Left ventricle: The left ventricular cavity size is normal.   ???? There is moderate concentric hypertrophy noted. Systolic   ???? function is normal by the biplane method of disks. The ejection   ???? fraction is 61% (+/-5%). Inconclusive diastolic evaluation due   ????  to poor image quality.   2. Regional wall motion abnormality: Hypokinesis of the basal-mid   ???? anteroseptal, basal-mid inferoseptal, and basal-mid inferior   ???? myocardium.   3. Right ventricle: The right ventricle is poorly visualized. The   ???? right ventricular cavity size is normal. Systolic function is   ???? normal as estimated by visual and quantitative measures.   4. Left atrium: The left atrium is severely dilated as determined   ???? by left atrial volume index.           I have independently interpreted Most recent ECG, notable for NSR with 1st degree heart block, RBBB, and left axis deviation.    I have recommended the primary team order pertinent cardiac tests as detailed above in the assessment and plan.

## 2021-08-29 NOTE — Unmapped (Signed)
Hepatology Consult Service   Initial Consultation         Assessment and Recommendations:   Richard [name] is a 56 y.o. male with a PMHx of liver transplantation on 03/02/2012 (Liver) for Cirrhosis 2/2 Richard [name], HTN, CKD, Obesity, DM that presented to [facility] with chest pain, abd pain, elevated troponins and acutely elevated liver enzymes in setting of severely elevated BP. Pt is seen in consultation at the request of Vanetta Mulders, MD (Cardiology [facility])) for elevated liver enzymes .    History of liver transplant \\acutely  elevated liver enzymes  Patient now has 2 hospital admissions in less than 1 month with acutely elevated liver enzymes.  Both are in the context of severely elevated blood pressure with reported chest tightness and abdominal pain, as well as elevated troponin with wall motion abnormalities on recent TTE.  His enzymes on admission were notable for only a mildly elevated AST, but then subsequently trended up to the 100s within 12 hours in the setting of BP 198/109.  Patient has no history of rejection.  Low concern for acute infection.  Liver transplant ultrasound reassuring. Noncon CT limited, but also with no other acute issue noted.  Patient reports being compliant with his immunosuppression and his Tac level is appropriate on admission.  Overall, high suspicion is that his liver enzymes are related to hypertensive emergency.  Cardiology has been consulted.  Recommends close monitoring/optimization of blood pressure control. Can also consider mesenteric doppler US given his reported intermittent severe abd, and inability to eval with contrasted CT. Though, exam and lactate reassuring against acute bowel ischemia, and some concern for nephrolithiasis as contributor.     -- Continue mycophenolate mofetil 250 mg twice daily, tacrolimus 0.5 mg twice daily  -- Obtain daily tac trough level   -- Trend CMP, INR daily  -- Consider mesenteric Doppler ultrasound  -- Agree with cardiology consult  -- Repeat TTE pending  -- Ongoing monitoring/optimization of blood pressure control  Thank you for involving [place] in the care of your patient. We will continue to follow along with you.    For questions, please contact the on-call fellow for the Hepatology Consult Service at [phone].     Reason for Consultation:   Pt was seen in consultation at the request of Vanetta Mulders, MD (Cardiology [facility])) for elevated LFTs.    Subjective:   HPI:    Patient reports a similar hospitalization in mid February where he presented to an outside hospital in the setting of chest tightness and significant abdominal pain.  He had an elevated troponin there.  Also noted to be severely hypertensive during that admission.  Liver enzymes did acutely elevate while there but then returned to normal.    States that he has been having lower abdominal pain intermittently on and off.  Does appear there has been some concern for either bladder spasms or nephrolithiasis based on current work-up.  On our interview he denied any abdominal pain.  Chest tightness had also resolved.    He reports being compliant with all his immunosuppression.  Also reports being compliant with his home antihypertensives.  Denies any illicit drug use.  Denies alcohol use.    Denies any fevers, chills, blood in stool, emesis.    Objective:   Physical Exam:  Temp:  [36.4 ??C (97.5 ??F)-36.6 ??C (97.9 ??F)] 36.4 ??C (97.5 ??F)  Heart Rate:  [58-92] 62  SpO2 Pulse:  [59] 59  Resp:  [12-23] 20  BP: (137-198)/(79-109) 157/100  SpO2:  [96 %-100 %] 97 %    Gen: WDWN male in NAD, answers questions appropriately  Abdomen: Soft, NTND, no rebound/guarding, no hepatosplenomegaly  Extremities: trace edema in the BLEs    Pertinent Labs/Studies Reviewed

## 2021-08-29 NOTE — Unmapped (Addendum)
Richard [name] is a 56 y.o. male with a PMHx of liver transplant (for NASH cirrhosis, on CellCept and tacrolimus), CKD 4, T2DM, HTN, who presented with intermittent, increasingly frequent severe abdominal pain  x2 weeks associated with chest tightness, and was found to have elevated blood pressures, troponin, and AST/ALT.  Troponin elevation was attributable to type II MI 2/2 hypertension. LFT changes were also most likely related to hypertension, as they downtrended alongside improved blood pressure control. A definitive explanation for episodic, severe abdominal pain was not found-- however patient's presentation was overall most consistent with ureteral or bladder spasms in setting of nephrolithiasis (given small R renal calculi noted on admission CT). Discharge was complicated by poor glycemic control requiring insulin titration, and challenges with insurance coverage for home insulin supplies. He was discharged home with plan for follow-up with PCP, liver transplant clinic, urology, and cardiology.    Hospital course by problem as follows:    Type 2 MI, Hypertensive emergency  Hx hypertension  CAD, HFpEF  Presented with acute onset chest tightness that started at rest, last several hours, and resolved by time of admission. No further chest pain or tightness throughout admission. troponin peak 213, plateaued ~200's; delayed downtrend most likely due to impaired renal clearance in setting of CKD 4. ECG with ST depressions in V1-V4, as well as RBBB and 1st degree AV block.   TTE 08/30/2021 showed LVEF 5-60%, concentric LVH, grade 2 diastolic dysfunction, moderate RV dilatation, mild/moderate biatrial enlargement, and segmental hypokinesis of the mid inferior LV wall.  Cardiology was consulted on admission, determined that there was no indication for emergent cardiac cath.  Recommended blood pressure optimization.  Patient was discharged home with plan for follow-up with PCP and cardiology.   He was discharged home on the following medication regimen:  - ASA 81 mg daily  - home amlodipine 10mg  daily  - hydralazine 25mg  QID  - imdur 60mg  daily  - nitroglycerin PRN chest pain  - furosemide 20mg  PO daily  -Atorvastatin 40 mg daily     Hx liver transplant 2/2 NASH cirrhosis I Elevated AST/ALT:  Presented initially for chest tightness and suprapubic pain (see below), initial liver enzymes unremarkable, but repeat labs show new AST elevation to 311, ALT to 196, Tbili 1.9. Synthetic function intact. Hepatology was consulted, and determined that mild LFT elevation on admission most likely related to hypertensive emergency; AST/ALT downtrend in setting of improved blood pressure control corroborated this.  Transplant rejection was deemed unlikely, given LFT downtrend, drug level appropriate on admission, and reassuring liver/transplant ultrasound (with normal vasculature/flow). CMV & Viral hepatitis panel negative. EToH and Tylenol levels on admission negative.  EBV positive, consistent with known history. He was continued on home tacrolimus and mycophenolate mofetil. He was discharged home with plan for follow-up in liver transplant clinic.     Abdominal & suprapubic pain: presented with severe, intermittent abdominal pain.  During episodes, patient with diffuse tenderness to palpation on exam but no peritoneal signs.  In between episodes, symptoms resolved completely and patient with mild epigastric tenderness but otherwise benign exam.  No CVA tenderness.  CT A/P 3/6 notable for several nonobstructing R renal calculi; normal caliber aorta; no evidence of bowel obstruction, intra-abdominal fluid, or free air.  Liver transplant U/S 3/6 with echotexture similar to prior 04/2020, and patent blood vessels with normal flow. Mesenteric ischemia was considered, but deemed unlikely given normal lactate and pain not associated with eating. Patient's presentation was overall most consistent with ureteral or  bladder spasms in setting of nephrolithiasis (given small R renal calculi noted on admission CT, and sterile pyuria on UA). He initially required pain control with multiple doses of as-needed opioids (PO and IV) and tylenol. Pain resolved on hospital day 2, and pt was abdominal pain-free >48hr by time of discharge, without any medications for pain.    T2DM: A1c 8.3. Initially managed with SSI, then transitioned to basal/bolus insulin regimen once tolerating reliable p.o.  Hospital course complicated by persistent hyperglycemia with blood sugars 200-300s.   He was discharged home the following insulin regimen:  -- insulin tresiba 12u nightly (every night)  --insulin fiasp 4u three times a day before meals  -- insulin fiasp, sliding scale 4 times per day, with meals and at bedtime:   BG 151-200 = 1 units,   BG 201-250 = 2 units,   BG 251-300 = 3 units,   BG >300 = 4 units   Insulin will likely need to be uptitrated as outpatient.  Usually uses a Dexcom at home, but this requires a prior Auth with his new insurance.  He was discharged home with a glucose meter and test strips.  Patient was discharged home with plan for PCP follow-up.    CKD4: Cr 3.25 on admission, consistent with recent baseline. Downtrended to 2.85 by day of discharge. U/S liver transplant 3/6 notable for atrophic and echogenic kidneys, suggestive of medial renal disease.

## 2021-08-29 NOTE — Unmapped (Signed)
[facility] History & Physical    Assessment & Plan:   Richard [name] is a 56 y.o. male with hx liver transplant (2013 2/2 NASH cirrhosis), HTN, DM who presented with chest tightness, suprapubic pain, found to have elevated troponin and AST/ALT.     Principal Problem:    Type 2 MI (myocardial infarction) (CMS-HCC)  Active Problems:    Hypertension    Liver replaced by transplant (CMS-HCC)    Morbid obesity with BMI of 45.0-49.9, adult (CMS-HCC)    Immunosuppression due to drug therapy (CMS-HCC)    CKD (chronic kidney disease) stage 4, GFR 15-29 ml/min (CMS-HCC)    Coronary artery disease    Abnormal transaminases  Resolved Problems:    * No resolved hospital problems. *    Type 2 MI I CAD I HTN  Presented with acute onset of several hours of chest tightness that started at rest (now resolved) and elevated troponin, now downtrending. ECG with ST depressions in V1-V4, as well as RBBB and 1st degree AV block. Recently admitted to [facility] 2/11-2/14 for hypertensive episode in which he was found to have elevated troponin, similar ST changes on ECG, and TTE poor quality but with hypokinesis of the basal-mid anteroseptal, basal-mid inferoseptal, and basal-mid inferior myocardium, was planned for cardiac stress MRI but unable to fit in scanner. Overall do have concern for cardiac etiology of his symptoms and feel he needs further workup, but given acute rise in AST/ALT and CKD, coronary angiography is likely contraindicated at the moment.  - continue home ASA 81mg , imdur 30mg  daily, amlodipine 10mg  daily, hydral 25mg  q6  - nitroglycerin prn if further chest pain  - Cardiology consulted    Hx liver transplant 2/2 NASH cirrhosis I Elevated AST/ALT  Presented initially for chest tightness and suprapubic pain (see below), initial liver enzymes unremarkable, but repeat labs show new AST elevation to 311, ALT to 196, Tbili 1.9. Synthetic function intact. Reports taking immunosuppressants as directed, denies any other abdominal symptoms, underwent US liver transplant in ED that was unremarkable and noncontrasted CTAP that was notable only for 2mm nonobstructing nephrolithiasis. Unclear etiology, but did have similar AST/ALT elevation after presentation to [facility] with severe hypertension and type 2 MI last month. Could also consider congestive hepatopathy given recent TTE findings, but not clinically in heart failure. No recent new culprit medications.   - Hepatology consulted  - continue tacrolimus 0.5mg  BID, cellcept 250mg  BID  - Trend hepatic function panel  - Follow up tac trough level    Suprapubic pain  Reports intermittent acute onset suprapubic pain lasting up to an hour over the past few weeks. Found to have 2mm non obstructing R renal calculi, UA with small amount of blood, WBCs. Pain most likely due to nephrolithiasis vs associated bladder spasms. Cr elevated but consistent with recent baseline.  - Urology referral at discharge     Hypokalemia  - Replete and trend    CKD  Cr elevated to 3.25, consistent with recent baseline.  - Trend Cr, minimize nephrotoxins    DM  Reports taking SSI only at home, though discharge from [facility] lists levemir 20u BID as well. A1c is 8.3  - start with SSI only, start standing insulin as needed    Daily Checklist:  Diet: Diabetic Diet  DVT PPx: Not Indicated - Padua Score <4  Electrolytes: Replete Potassium to >/=4 and Magnesium to >/=2  Code Status: Full Code    Chief Concern:   Type 2 MI (myocardial  infarction) (CMS-HCC)    Subjective:   HPI:  Richard [name] is a 56 y.o. male with hx liver transplant (2013 2/2 NASH cirrhosis), HTN, DM who presented with chest tightness, suprapubic pain, found to have elevated troponin and AST/ALT.    Patient was recently admitted to [facility] with shortness of breath and RUQ pain, found to be hypertensive to SBP 220s, with symptoms resolving after hypertension was treated with a nicardipine drip. He was also found to have hsTrop elevated to 252 and new ST depressions in V1-V4 on ECG and regional wall motion abnormalities on TTE (though poor quality). He was planned for cardiac stress MRI but could not fit in scanner, so further workup ultimately deferred to outpatient setting. He also had elevated AST/ALT that ultimately resolved without intervention.    He presents here with new onset chest tightness that started abruptly while at rest yesterday, a new symptom for him. It lasted for several hours and ultimately resolved overnight while in the ED. He also reports intermittent severe suprapubic pain which has been occurring every few days for the past few weeks, lasts an hour or so, and then goes away. He denies dyspnea, epigastric or other abdominal pain, dysuria, cough, sore throat, fevers, chills, constipation, diarrhea.    On arrival to the ED he was hypertensive to SBP 198 with other vital signs stable. Workup otherwise notable for ECG unchanged from prior, hsTrop elevated to 213 and then downtrending, Cr 3.25 (similar to baseline), CTAP notable for nonobstructing renal calculus, unremarkable liver US and CXR, and initially with normal AST/ALT that uptrended to 311 and 196 at recheck.     Designated Environmental health practitioner:  Richard [name] currently has decisional capacity for healthcare decision-making and is able to designate a surrogate healthcare decision maker. Richard [name] designated healthcare decision maker(s) is/are Richard [name] (the patient's adult sibling) as denoted by stated patient preference.    Allergies:  Insulin asp prt-insulin aspart and Insulin aspart    Medications:   Prior to Admission medications    Medication Dose, Route, Frequency   amLODIPine (NORVASC) 10 MG tablet 10 mg, Oral, Daily (standard)   aspirin (ECOTRIN) 81 MG tablet 81 mg, Daily (standard)   carvediloL (COREG) 6.25 MG tablet 2 tablets, 2 times a day   CHOLECALCIFEROL, VITAMIN D3, (VITAMIN D3 ORAL) 2,000 Units, Daily (standard)   furosemide (LASIX) 40 MG tablet 80 mg, Daily before breakfast   hydrALAZINE (APRESOLINE) 25 MG tablet 25 mg, 3 times a day (standard)   insulin aspart (NOVOLOG FLEXPEN SUBQ) 25-35 Units, Subcutaneous, 3 times a day (with meals), Sliding scale   insulin detemir U-100 (LEVEMIR) 100 unit/mL (3 mL) injection pen 35 Units, Subcutaneous   insulin lispro 100 unit/mL inph 20 Units, Subcutaneous, Daily   isosorbide mononitrate (IMDUR) 30 MG 24 hr tablet 30 mg, Daily before breakfast   magnesium oxide (MAG-OX) 400 mg (241.3 mg elemental magnesium) tablet 400 mg, Oral   meTOLAzone (ZAROXOLYN) 2.5 MG tablet No dose, route, or frequency recorded.   multivitamin (TAB-A-VITE/THERAGRAN) per tablet 1 tablet, Oral, Daily (standard)   multivitamin,tx-iron-minerals (COMPLETE MULTIVITAMIN) Tab Daily (standard)   mycophenolate (CELLCEPT) 250 mg capsule 250 mg, Oral, 2 times a day (standard)   tacrolimus (PROGRAF) 0.5 MG capsule 0.5 mg, Oral, 2 times a day       Medical History:  Past Medical History:   Diagnosis Date   ??? COVID-19 07/18/2019   ??? Diabetes mellitus (CMS-HCC)    ??? Family history of malignant neoplasm of  prostate 06/23/2015   ??? Hepatic cirrhosis (CMS-HCC) 06/23/2015    Overview:  Secondary to NASH; followed by Dr. Yevonne Pax, GI.  Last Assessment & Plan:  Relevant Hx: Course: Daily Update: Today's Plan:    ??? Hypertension    ??? Liver cirrhosis secondary to NASH (nonalcoholic steatohepatitis) (CMS-HCC)     s/p liver transplant 2013       Surgical History:  Past Surgical History:   Procedure Laterality Date   ??? liver transpant     ??? LIVER TRANSPLANTATION  2013   ??? PR COLONOSCOPY W/BIOPSY SINGLE/MULTIPLE N/A 08/13/2018    Procedure: COLONOSCOPY, FLEXIBLE, PROXIMAL TO SPLENIC FLEXURE; WITH BIOPSY, SINGLE OR MULTIPLE;  Surgeon: Neysa Hotter, MD;  [facility];  Service: Gastroenterology   ??? PR COLSC FLX W/RMVL OF TUMOR POLYP LESION SNARE TQ N/A 08/13/2018    Procedure: COLONOSCOPY FLEX; W/REMOV TUMOR/LES BY SNARE;  Surgeon: Neysa Hotter, MD; [facility];  Service: Gastroenterology   ??? PR UPPER GI ENDOSCOPY,BIOPSY N/A 07/13/2015    Procedure: UGI ENDOSCOPY; WITH BIOPSY, SINGLE OR MULTIPLE;  Surgeon: Joaquin Music, MD;  [facility];  Service: Gastroenterology       Family History:   Family History   Problem Relation Age of Onset   ??? Diabetes Father    ??? 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 Smoking status: Former   ??? Smokeless tobacco: Never   Substance Use Topics   ??? Alcohol use: No   ??? Drug use: No        Review of Systems:  10 systems were reviewed and are negative unless otherwise mentioned in the HPI    Objective:   Physical Exam:  Temp:  [36.4 ??C (97.5 ??F)-36.6 ??C (97.9 ??F)] 36.4 ??C (97.6 ??F)  Heart Rate:  [58-92] 85  SpO2 Pulse:  [59-85] 85  Resp:  [12-23] 20  BP: (137-198)/(79-109) 163/90  SpO2:  [95 %-100 %] 95 %    Gen: WDWN in NAD, answers questions appropriately  Eyes: Sclera anicteric, EOMI  HENT: atraumatic, normocephalic  Neck: no JVD  Heart: RRR, S1, S2, no M/R/G, no chest wall tenderness  Lungs: CTAB, no crackles or wheezes, no use of accessory muscles  Abdomen: Normoactive bowel sounds, soft, NTND, no rebound/guarding, no hepatosplenomegaly  Extremities: no clubbing, cyanosis, or edema: pulses are +2 in bilateral upper and lower extremities  Neuro: CN II-XI grossly intact, no focal deficits.  Skin:  No rashes, lesions on clothed exam  Psych: Alert, oriented, normal mood and affect.     Labs/Studies/Imaging:  Labs, Studies, Imaging from the last 24hrs per EMR and personally reviewed

## 2021-08-29 NOTE — Unmapped (Signed)
Bed: HALL-01A  Expected date:   Expected time:   Means of arrival:   Comments:

## 2021-08-29 NOTE — Unmapped (Signed)
[facility] Emergency Department Provider Note      ED Course, Assessment, and Plan     Impression:  Richard [name] is a 56 y.o. male with history of diabetes, hypertension, cirrhosis s/p liver transplant in 2013 who presents to the ED for shortness of breath, chest pain, and abdominal pain present over the past 3 months worsening over the past 2 weeks.  Vitals significant for hypertension but otherwise unremarkable.  On exam patient is in no distress, lungs clear bilaterally with normal heart sounds, abdomen soft and nondistended with mild generalized tenderness to palpation, 1+ bilateral lower extremity edema, normal strength and sensation in all extremities, mentating appropriately.    Overall presentation concerning for ACS, pneumonia, pneumothorax, liver injury, CHF, UTI, kidney stone, among others.  Plan for labs, UA, EKG, and imaging.    Progress Notes:  ED Course as of 08/30/21 1037   Sun Aug 28, 2021   2008 Entered the patient's room for evaluation and patient was not present, was unable to locate patient in the ED or in any restrooms nearby so I called the patient on his cell phone number and he states that he left because he was told by the ultrasound technician performing his liver ultrasound that he was discharged home.  He did have concerning EKG changes from previous as well as an elevated troponin and did state over the phone that he still feels unwell with regard to his chest pain so I asked that he return to the ED.  He lives over an hour away but states that he will return.  We will continue thorough evaluation upon his return to the ED.   Mon Aug 29, 2021   6010 Workup shows EKG w/ sinus rhythm and RBBB without ST elevations or depressions, elevated troponin and BNP, CXR without focal opacity or pulmonary edema, CT abd/pelvis with small kidney stone otherwise unremarkable, liver US without acute abnormality, bedside cardiac [place] without obvious dysfunction. Given elevated troponin in the setting of chest pain we will plan to admit for further evaluation.     Discussion of Management with other Physicians, QHP, or Appropriate Source: Admitting team  Independent Interpretation of Studies: EKG, CXR, Liver US, bedside cardiac [place]  External Records Reviewed: 05/04/20 clinic note  Consideration of Admission, Observation, Transfer, or Escalation of Care: Admitted  Social determinants that significantly affected care: None  Prescription drug(s) considered but not prescribed: N/A  Diagnostic tests considered but not performed: N/A  _____________________________________________________________________    The case was discussed with the attending physician who is in agreement with the above assessment and plan.    Additional Medical Decision Making     The patient's vital signs, EKG tracing, and pertinent labs and imaging results that were available during my care of the patient have been independently reviewed by me and considered in my medical decision making. I have reviewed the patient's prior medical records where available.    History     Chief Complaint:   Chief Complaint   Patient presents with   ??? Shortness of Breath       History of Present Illness:  Richard [name] is a 56 y.o. male with history of hypertension, diabetes, cirrhosis s/p liver transplant 2013 who presented to the ED for abdominal pain and chest pain/shortness of breath.  Describes mild lower abdominal pain present over the past few months worsening over the past 2 weeks with associated nausea and intermittent chest pain and shortness of breath.  Denies  any fevers or chills, cough, vomiting, dysuria or hematuria, melena or hematochezia, penile discharge.  Denies alcohol, tobacco, or recreational substance use. Denies current chest pain.    Additional Historian(s): None  Limitation to History: None    Past Medical History:  Past Medical History:   Diagnosis Date   ??? COVID-19 07/18/2019   ??? Diabetes mellitus (CMS-HCC)    ??? Family history of malignant neoplasm of prostate 06/23/2015   ??? Hepatic cirrhosis (CMS-HCC) 06/23/2015    Overview:  Secondary to NASH; followed by Dr. Yevonne Pax, GI.  Last Assessment & Plan:  Relevant Hx: Course: Daily Update: Today's Plan:    ??? Hypertension    ??? Liver cirrhosis secondary to NASH (nonalcoholic steatohepatitis) (CMS-HCC)     s/p liver transplant 2013       Medications:     Current Facility-Administered Medications:   ???  acetaminophen (TYLENOL) tablet 1,000 mg, 1,000 mg, Oral, Q6H PRN, Kathlee Nations, MD  ???  amLODIPine (NORVASC) tablet 10 mg, 10 mg, Oral, Daily, Satira Sark, MD, 10 mg at 08/30/21 6295  ???  aspirin chewable tablet 81 mg, 81 mg, Oral, Daily, Satira Sark, MD, 81 mg at 08/30/21 2841  ???  dextrose 50 % in water (D50W) 50 % solution 12.5 g, 12.5 g, Intravenous, Q10 Min PRN, Satira Sark, MD  ???  glucagon injection 1 mg, 1 mg, Intramuscular, Once PRN, Satira Sark, MD  ???  glucose chewable tablet 16 g, 16 g, Oral, Q10 Min PRN, Satira Sark, MD  ???  hydrALAZINE (APRESOLINE) tablet 25 mg, 25 mg, Oral, Q6H SCH, Satira Sark, MD, 25 mg at 08/30/21 0515  ???  HYDROmorphone (PF) (DILAUDID) injection 1.5 mg, 1.5 mg, Intravenous, Q3H PRN, Pamalee Leyden, MD, 1.5 mg at 08/30/21 0948  ???  hydrOXYzine (ATARAX) tablet 25 mg, 25 mg, Oral, Q6H PRN, Kathlee Nations, MD, 25 mg at 08/30/21 0117  ???  insulin lispro (HumaLOG) injection 0-20 Units, 0-20 Units, Subcutaneous, Q4H SCH, Satira Sark, MD, 1 Units at 08/30/21 0515  ???  isosorbide mononitrate (IMDUR) 24 hr tablet 60 mg, 60 mg, Oral, Daily, Delle Reining, MD, 60 mg at 08/30/21 3244  ???  lidocaine (LIDODERM) 5 % patch 2 patch, 2 patch, Transdermal, Daily, Arlean Hopping, MD  ???  melatonin tablet 3 mg, 3 mg, Oral, Nightly PRN, Pamalee Leyden, MD  ???  mycophenolate (CELLCEPT) capsule 250 mg, 250 mg, Oral, BID, Satira Sark, MD, 250 mg at 08/30/21 0102  ???  nitroglycerin (NITROSTAT) SL tablet 0.4 mg, 0.4 mg, Sublingual, Q5 Min PRN, Satira Sark, MD  ???  oxyCODONE (ROXICODONE) immediate release tablet 5 mg, 5 mg, Oral, Q6H PRN, 5 mg at 08/29/21 1724 **OR** oxyCODONE (ROXICODONE) immediate release tablet 10 mg, 10 mg, Oral, Q6H PRN, Kathlee Nations, MD  ???  polyethylene glycol (MIRALAX) packet 17 g, 17 g, Oral, Daily, Pamalee Leyden, MD  ???  tacrolimus (PROGRAF) capsule 0.5 mg, 0.5 mg, Oral, BID, Satira Sark, MD, 0.5 mg at 08/30/21 7253  Current Discharge Medication List      CONTINUE these medications which have NOT CHANGED    Details   amLODIPine (NORVASC) 10 MG tablet Take 10 mg by mouth daily.      aspirin (ECOTRIN) 81 MG tablet Take 81 mg by mouth daily.      CHOLECALCIFEROL, VITAMIN D3, (VITAMIN D3 ORAL) Take 2,000 Units by mouth daily.      furosemide (LASIX)  40 MG tablet 40 mg daily before breakfast.      hydrALAZINE (APRESOLINE) 25 MG tablet 25 mg Three (3) times a day.      insulin aspart (NOVOLOG FLEXPEN SUBQ) Inject 25-35 Units under the skin Three (3) times a day with a meal. Sliding scale       insulin detemir U-100 (LEVEMIR) 100 unit/mL (3 mL) injection pen Inject 35 Units under the skin.       insulin lispro 100 unit/mL inph Inject 20 Units under the skin daily.      isosorbide mononitrate (IMDUR) 30 MG 24 hr tablet 30 mg every morning before breakfast.      magnesium oxide (MAG-OX) 400 mg (241.3 mg elemental magnesium) tablet Take 400 mg by mouth.      multivitamin (TAB-A-VITE/THERAGRAN) per tablet Take 1 tablet by mouth daily.      mycophenolate (CELLCEPT) 250 mg capsule Take 1 capsule (250 mg total) by mouth Two (2) times a day.  Qty: 60 capsule, Refills: 11    Comments: Transplant Date: 03/02/2012 (Liver)  Associated Diagnoses: Liver replaced by transplant (CMS-HCC)      tacrolimus (PROGRAF) 0.5 MG capsule Take 1 capsule (0.5 mg total) by mouth two (2) times a day.  Qty: 180 capsule, Refills: 3    Comments: Transplant Date: 03/02/2012 (Liver)  Associated Diagnoses: Liver replaced by transplant (CMS-HCC); Liver transplanted (CMS-HCC)             Allergies:   Insulin asp prt-insulin aspart and Insulin aspart    Past Surgical History:   Past Surgical History:   Procedure Laterality Date   ??? liver transpant     ??? LIVER TRANSPLANTATION  2013   ??? PR COLONOSCOPY W/BIOPSY SINGLE/MULTIPLE N/A 08/13/2018    Procedure: COLONOSCOPY, FLEXIBLE, PROXIMAL TO SPLENIC FLEXURE; WITH BIOPSY, SINGLE OR MULTIPLE;  Surgeon: Neysa Hotter, MD;  [facility];  Service: Gastroenterology   ??? PR COLSC FLX W/RMVL OF TUMOR POLYP LESION SNARE TQ N/A 08/13/2018    Procedure: COLONOSCOPY FLEX; W/REMOV TUMOR/LES BY SNARE;  Surgeon: Neysa Hotter, MD;  Location: [facility];  Service: Gastroenterology   ??? PR UPPER GI ENDOSCOPY,BIOPSY N/A 07/13/2015    Procedure: UGI ENDOSCOPY; WITH BIOPSY, SINGLE OR MULTIPLE;  Surgeon: Joaquin Music, MD;  Location: GI [facility];  Service: Gastroenterology       Social History:   Social History     Tobacco Use   ??? Smoking status: Former   ??? Smokeless tobacco: Never   Substance Use Topics   ??? Alcohol use: No       Family History:  Family History   Problem Relation Age of Onset   ??? Diabetes Father    ??? Diabetes Paternal Grandfather         Physical Exam     Vital Signs:    BP 149/89  - Pulse 93  - Temp 36.6 ??C (97.9 ??F) (Oral)  - Resp 18  - SpO2 95%     General: Alert and oriented, in no distress.  Skin: Skin is warm and dry.  HEENT: Normocephalic and atraumatic, moist mucous membranes, no nasal drainage, no intra-oral lesions or erythema.  Lungs: Normal respiratory effort. Breath sounds clear bilaterally.  Heart: Regular rhythm, normal heart sounds.  Abdomen: Soft and non-distended, mild generalized tenderness to palpation.  Genitourinary/Rectal: Deferred  Musculoskeletal: No edema, no deformities or tenderness.  Lymphatic: No cervical or supraclavicular lymphadenopathy noted.  Neurological: Normal speech and  language. No gross focal neurologic deficits are appreciated.  Psychiatric: Normal affect and behavior for situation    Radiology     XR Chest 1 view Portable   Final Result      No acute abnormalities.      CT abdomen pelvis without contrast   Final Result   *Several nonobstructing right-sided renal calculi. No evidence of hydronephrosis.Otherwise, no acute abnormality identified throughout the abdomen or pelvis to explain etiology of patient's abdominal pain.   *Other chronic or incidental findings, as noted above.      US Liver Transplant   Final Result   Evaluation is limited due to patient body habitus and overlying bowel gas. Within this limitation, the following conclusions are made:   1. Sequelae of liver transplant. Numerous right upper quadrant collateral vessels extending to the splenic hilum are similar in appearance dating back to 2014.   2. Patent hepatic hepatic transplant vasculature with normal flow direction.   3. Atrophic and echogenic kidneys, suggestive of medial renal disease.      ++++++++++++++++++++   MODIFIED REPORT:    (08/28/2021 5:30 PM)   This report has been modified from its preliminary version; you may check the prior versions of radiology report, results history link for prior report versions.    -----------------------------------------------               XR Chest 2 views   Final Result      No acute abnormality.      ED POCUS [place] Protocol Emergency    (Results Pending)   Echocardiogram W Colorflow Spectral Doppler    (Results Pending)   PVL Mesenteric Artery Duplex    (Results Pending)   XR Abdomen 1 View    (Results Pending)       Labs     Labs Reviewed   COMPREHENSIVE METABOLIC PANEL - Abnormal; Notable for the following components:       Result Value    BUN 33 (*)     Creatinine 3.25 (*)     eGFR CKD-EPI (2021) Male 22 (*)     Glucose 187 (*)     AST 42 (*)     All other components within normal limits   HIGH SENSITIVITY TROPONIN I - SINGLE - Abnormal; Notable for the following components:    hsTroponin I 213 (*)     All other components within normal limits   B-TYPE NATRIURETIC PEPTIDE - Abnormal; Notable for the following components:    BNP 387 (*)     All other components within normal limits   APTT - Abnormal; Notable for the following components:    APTT 23.4 (*)     All other components within normal limits   HIGH SENSITIVITY TROPONIN I - SINGLE - Abnormal; Notable for the following components:    hsTroponin I 207 (*)     All other components within normal limits   BLOOD GAS, VENOUS - Abnormal; Notable for the following components:    HCO3, Ven 30 (*)     Base Excess, Ven 4.5 (*)     All other components within normal limits   HIGH SENSITIVITY TROPONIN I - SINGLE - Abnormal; Notable for the following components:    hsTroponin I 205 (*)     All other components within normal limits   URINALYSIS WITH MICROSCOPY WITH CULTURE REFLEX - Abnormal; Notable for the following components:    Protein, UA 300 mg/dL (*)     Glucose, UA 161  mg/dL (*)     Blood, UA Small (*)     WBC, UA 6 (*)     Bacteria, UA Rare (*)     WBC Clumps Rare (*)     Mucus, UA Rare (*)     All other components within normal limits   HIGH SENSITIVITY TROPONIN I - SINGLE - Abnormal; Notable for the following components:    hsTroponin I 200 (*)     All other components within normal limits   COMPREHENSIVE METABOLIC PANEL - Abnormal; Notable for the following components:    BUN 37 (*)     Creatinine 3.48 (*)     eGFR CKD-EPI (2021) Male 20 (*)     Albumin 3.2 (*)     Total Bilirubin 1.9 (*)     AST 311 (*)     ALT 196 (*)     Alkaline Phosphatase 131 (*)     All other components within normal limits   HEMOGLOBIN A1C - Abnormal; Notable for the following components:    Hemoglobin A1C 8.3 (*)     All other components within normal limits    Narrative:     Screening or Diagnosis of Diabetes Mellitus*   A1c Reference Interval        Interpretation  4.8 - 5.6                     Normal  5.7 - 6.4                     Dysglycemia  >6.4                          Diabetes Mellitus    *Not recommended for diagnosis of diabetes in children with Cystic Fibrosis or with symptoms suggestive of acute onset type 1 diabetes.        A1c Glycemic Goal: <7.0 %    **Goals should be individualized; more or less stringent A1c glycemic goals may be appropriate for individual patients.   (Adopted from: 2020 ADA Standards of Medical Care In Diabetes)      TACROLIMUS LEVEL, TROUGH - Abnormal; Notable for the following components:    Tacrolimus, Trough 2.1 (*)     All other components within normal limits    Narrative:     Reference ranges vary with organ type, time since transplant, and patient status. Contact the laboratory, pharmacy, or transplant coordinator for more information. This test was performed using liquid chromatography tandem mass spectrometry. This methodology was developed and its performance characteristics determined by the [facility] of the [employer], [employer]. This test has not been cleared or approved by the FDA. The laboratory is regulated under CAP and CLIA as qualified to perform high-complexity testing. This test is to be used for clinical purposes and should not be regarded as investigational or for research. Results should be interpreted in context with other laboratory and clinical data.     BILIRUBIN, DIRECT - Abnormal; Notable for the following components:    Bilirubin, Direct 0.90 (*)     All other components within normal limits   CK - Abnormal; Notable for the following components:    Creatine Kinase, Total 264.0 (*)     All other components within normal limits   LIPID PANEL - Abnormal; Notable for the following components:    Triglycerides 169 (*)     HDL 35 (*)  Chol/HDL Ratio 4.7 (*)     All other components within normal limits   BASIC METABOLIC PANEL - Abnormal; Notable for the following components:    BUN 36 (*)     Creatinine 3.58 (*)     eGFR CKD-EPI (2021) Male 19 (*)     Glucose 187 (*)     All other components within normal limits   CBC - Abnormal; Notable for the following components:    RBC 4.21 (*)     HGB 11.9 (*)     HCT 34.6 (*)     All other components within normal limits   HEPATIC FUNCTION PANEL - Abnormal; Notable for the following components:    Albumin 3.3 (*)     AST 100 (*)     ALT 152 (*)     Alkaline Phosphatase 144 (*)     All other components within normal limits   POCT GLUCOSE, INTERFACED - Abnormal; Notable for the following components:    Glucose, POC 257 (*)     All other components within normal limits   POCT GLUCOSE, INTERFACED - Abnormal; Notable for the following components:    Glucose, POC 280 (*)     All other components within normal limits   POCT GLUCOSE, INTERFACED - Abnormal; Notable for the following components:    Glucose, POC 291 (*)     All other components within normal limits   POCT GLUCOSE, INTERFACED - Abnormal; Notable for the following components:    Glucose, POC 185 (*)     All other components within normal limits   CBC W/ AUTO DIFF - Abnormal; Notable for the following components:    WBC 11.4 (*)     Absolute Neutrophils 9.9 (*)     Absolute Lymphocytes 0.8 (*)     All other components within normal limits   CBC W/ AUTO DIFF - Abnormal; Notable for the following components:    HGB 12.3 (*)     HCT 35.5 (*)     All other components within normal limits   INFLUENZA/RSV/COVID PCR - Normal    Narrative:     This test was performed using the Cepheid Xpert Xpress CoV-2/Flu/RSV plus assay, which has been validated by the CLIA-certified, CAP-inspected [employer] FDA has granted Emergency Use Authorization for this test. Negative results do not preclude infection and should be interpreted along with clinical observations, patient history, and epidemiological information. Information for providers and patients can be found here: [email or web address]   URINE CULTURE - Normal    Narrative: Specimen Source: Clean Catch   LIPASE - Normal   LACTATE, VENOUS, WHOLE BLOOD - Normal   ETHANOL - Normal    Narrative:     Testing for medical purposes only.   ACETAMINOPHEN LEVEL - Normal   TSH - Normal   LACTATE, VENOUS, WHOLE BLOOD - Normal   POCT GLUCOSE, INTERFACED - Normal   POCT GLUCOSE, INTERFACED - Normal   CHLAMYDIA/GONORRHOEAE NAA    Narrative:     --  Testing performed using the Hologic APTIMA Combo 2 Assay, an FDA-approved target amplification nucleic acid probe test for the qualitative detection of Chlamydia trachomatis and Neisseria gonorrhoeae. The performance characteristics have been validated by the [facility] Clinical Molecular Microbiology Laboratory.   BLOOD CULTURE   BLOOD CULTURE   URINE CULTURE   CBC W/ DIFFERENTIAL    Narrative:     The following orders were created for panel order CBC w/ Differential.  Procedure                               Abnormality         Status                     ---------                               -----------         ------                     CBC w/ Differential[[id number]]         Abnormal            Final result                 Please view results for these tests on the individual orders.   PROTIME-INR   CBC W/ DIFFERENTIAL    Narrative:     The following orders were created for panel order CBC w/ Differential.  Procedure                               Abnormality         Status                     ---------                               -----------         ------                     CBC w/ Differential[[id number]]         Abnormal            Final result                 Please view results for these tests on the individual orders.   CMV DNA, QUANTITATIVE, PCR   EBV QUANTITATIVE PCR, BLOOD   HEPATITIS PANEL, ACUTE   CMV IGG   CMV IGM   EPSTEIN-BARR VIRUS ANTIBODY PANEL   TACROLIMUS LEVEL, TROUGH   URINALYSIS WITH MICROSCOPY     _____________________________________________________________________    Please note - This documentation was generated using dictation and/or voice recognition software, and as such, may contain spelling or other transcription errors. Any questions regarding the content of this documentation should be directed to the individual who electronically signed.     Anders Grant, MD  Resident  08/30/21 [phone]

## 2021-08-30 LAB — URINALYSIS WITH MICROSCOPY
BACTERIA: NONE SEEN /HPF
BILIRUBIN UA: NEGATIVE
GLUCOSE UA: >1000 — AB
HYALINE CASTS: 1 /LPF (ref 0–1)
KETONES UA: NEGATIVE
LEUKOCYTE ESTERASE UA: NEGATIVE
NITRITE UA: NEGATIVE
PH UA: 6.5 (ref 5.0–9.0)
PROTEIN UA: 100 — AB
RBC UA: 1 /HPF (ref <=3)
SPECIFIC GRAVITY UA: 1.01 (ref 1.003–1.030)
SQUAMOUS EPITHELIAL: <1 /HPF (ref 0–5)
UROBILINOGEN UA: <2
WBC UA: 4 /HPF — ABNORMAL HIGH (ref <=2)

## 2021-08-30 LAB — CBC
HEMATOCRIT: 34.6 % — ABNORMAL LOW (ref 39.0–48.0)
HEMOGLOBIN: 11.9 g/dL — ABNORMAL LOW (ref 12.9–16.5)
MEAN CORPUSCULAR HEMOGLOBIN CONC: 34.4 g/dL (ref 32.0–36.0)
MEAN CORPUSCULAR HEMOGLOBIN: 28.2 pg (ref 25.9–32.4)
MEAN CORPUSCULAR VOLUME: 82.1 fL (ref 77.6–95.7)
MEAN PLATELET VOLUME: 7.5 fL (ref 6.8–10.7)
PLATELET COUNT: 217 10*9/L (ref 150–450)
RED BLOOD CELL COUNT: 4.21 10*12/L — ABNORMAL LOW (ref 4.26–5.60)
RED CELL DISTRIBUTION WIDTH: 12.9 % (ref 12.2–15.2)
WBC ADJUSTED: 6.5 10*9/L (ref 3.6–11.2)

## 2021-08-30 LAB — EPSTEIN-BARR VIRUS ANTIBODY PANEL
EPSTEIN-BARR NUCLEAR ANTIGEN AB: POSITIVE — AB
EPSTEIN-BARR VCA IGG ANTIBODY: POSITIVE — AB
EPSTEIN-BARR VCA IGM ANTIBODY: NEGATIVE

## 2021-08-30 LAB — HEPATIC FUNCTION PANEL
ALBUMIN: 3.3 g/dL — ABNORMAL LOW (ref 3.4–5.0)
ALKALINE PHOSPHATASE: 144 U/L — ABNORMAL HIGH (ref 46–116)
ALT (SGPT): 152 U/L — ABNORMAL HIGH (ref 10–49)
AST (SGOT): 100 U/L — ABNORMAL HIGH (ref <=34)
BILIRUBIN DIRECT: 0.3 mg/dL (ref 0.00–0.30)
BILIRUBIN TOTAL: 0.7 mg/dL (ref 0.3–1.2)
PROTEIN TOTAL: 6.9 g/dL (ref 5.7–8.2)

## 2021-08-30 LAB — BASIC METABOLIC PANEL
ANION GAP: 9 mmol/L (ref 5–14)
BLOOD UREA NITROGEN: 36 mg/dL — ABNORMAL HIGH (ref 9–23)
BUN / CREAT RATIO: 10
CALCIUM: 9.1 mg/dL (ref 8.7–10.4)
CHLORIDE: 102 mmol/L (ref 98–107)
CO2: 30 mmol/L (ref 20.0–31.0)
CREATININE: 3.58 mg/dL — ABNORMAL HIGH
EGFR CKD-EPI (2021) MALE: 19 mL/min/{1.73_m2} — ABNORMAL LOW (ref >=60)
GLUCOSE RANDOM: 187 mg/dL — ABNORMAL HIGH (ref 70–179)
POTASSIUM: 3.8 mmol/L (ref 3.4–4.8)
SODIUM: 141 mmol/L (ref 135–145)

## 2021-08-30 LAB — CMV DNA, QUANTITATIVE, PCR: CMV VIRAL LD: NOT DETECTED

## 2021-08-30 LAB — CMV IGM: CMV IGM: NEGATIVE

## 2021-08-30 LAB — TACROLIMUS LEVEL, TROUGH: TACROLIMUS, TROUGH: 3.4 ng/mL — ABNORMAL LOW (ref 5.0–15.0)

## 2021-08-30 LAB — LACTATE, VENOUS, WHOLE BLOOD: LACTATE BLOOD VENOUS: 1.6 mmol/L (ref 0.5–1.8)

## 2021-08-30 LAB — CMV IGG: CMV IGG: NEGATIVE

## 2021-08-30 MED ADMIN — insulin lispro (HumaLOG) injection 0-20 Units: 0-20 [IU] | SUBCUTANEOUS | @ 10:00:00 | Stop: 2021-08-30

## 2021-08-30 MED ADMIN — HYDROmorphone (PF) (DILAUDID) injection 1.5 mg: 1.5 mg | INTRAVENOUS | @ 05:00:00 | Stop: 2021-08-29

## 2021-08-30 MED ADMIN — aspirin chewable tablet 81 mg: 81 mg | ORAL | @ 13:00:00

## 2021-08-30 MED ADMIN — hydrALAZINE (APRESOLINE) tablet 25 mg: 25 mg | ORAL | @ 04:00:00

## 2021-08-30 MED ADMIN — acetaminophen (TYLENOL) tablet 1,000 mg: 1000 mg | ORAL | @ 10:00:00 | Stop: 2021-08-30

## 2021-08-30 MED ADMIN — HYDROmorphone (PF) (DILAUDID) injection 1.5 mg: 1.5 mg | INTRAVENOUS | @ 15:00:00 | Stop: 2021-09-13

## 2021-08-30 MED ADMIN — hydrALAZINE (APRESOLINE) tablet 25 mg: 25 mg | ORAL

## 2021-08-30 MED ADMIN — perflutren protein type-A microspheres (OPTISON) injection 2 mL: 2 mL | INTRAVENOUS | @ 19:00:00 | Stop: 2021-08-30

## 2021-08-30 MED ADMIN — mycophenolate (CELLCEPT) capsule 250 mg: 250 mg | ORAL | @ 13:00:00

## 2021-08-30 MED ADMIN — insulin lispro (HumaLOG) injection 0-20 Units: 0-20 [IU] | SUBCUTANEOUS | @ 17:00:00 | Stop: 2021-08-30

## 2021-08-30 MED ADMIN — HYDROmorphone (PF) (DILAUDID) injection 1.5 mg: 1.5 mg | INTRAVENOUS | @ 10:00:00 | Stop: 2021-09-13

## 2021-08-30 MED ADMIN — acetaminophen (TYLENOL) tablet 1,000 mg: 1000 mg | ORAL | @ 17:00:00

## 2021-08-30 MED ADMIN — insulin lispro (HumaLOG) injection 0-20 Units: 0-20 [IU] | SUBCUTANEOUS | @ 06:00:00 | Stop: 2021-08-30

## 2021-08-30 MED ADMIN — tacrolimus (PROGRAF) capsule 0.5 mg: .5 mg | ORAL | @ 13:00:00

## 2021-08-30 MED ADMIN — isosorbide mononitrate (IMDUR) 24 hr tablet 60 mg: 60 mg | ORAL | @ 13:00:00

## 2021-08-30 MED ADMIN — mycophenolate (CELLCEPT) capsule 250 mg: 250 mg | ORAL | @ 03:00:00

## 2021-08-30 MED ADMIN — insulin lispro (HumaLOG) injection 0-20 Units: 0-20 [IU] | SUBCUTANEOUS | @ 21:00:00 | Stop: 2021-08-30

## 2021-08-30 MED ADMIN — HYDROmorphone (PF) (DILAUDID) injection 1 mg: 1 mg | INTRAVENOUS | @ 02:00:00 | Stop: 2021-08-29

## 2021-08-30 MED ADMIN — tacrolimus (PROGRAF) capsule 0.5 mg: .5 mg | ORAL | @ 03:00:00

## 2021-08-30 MED ADMIN — insulin lispro (HumaLOG) injection 0-20 Units: 0-20 [IU] | SUBCUTANEOUS | @ 02:00:00

## 2021-08-30 MED ADMIN — hydrALAZINE (APRESOLINE) tablet 25 mg: 25 mg | ORAL | @ 10:00:00

## 2021-08-30 MED ADMIN — hydrALAZINE (APRESOLINE) tablet 25 mg: 25 mg | ORAL | @ 17:00:00

## 2021-08-30 MED ADMIN — hydrOXYzine (ATARAX) tablet 25 mg: 25 mg | ORAL | @ 06:00:00

## 2021-08-30 MED ADMIN — amLODIPine (NORVASC) tablet 10 mg: 10 mg | ORAL | @ 13:00:00

## 2021-08-30 MED ADMIN — HYDROmorphone (PF) (DILAUDID) injection 1.5 mg: 1.5 mg | INTRAVENOUS | @ 21:00:00 | Stop: 2021-09-13

## 2021-08-30 MED ADMIN — insulin lispro (HumaLOG) injection 0-20 Units: 0-20 [IU] | SUBCUTANEOUS

## 2021-08-30 MED ADMIN — acetaminophen (TYLENOL) tablet 1,000 mg: 1000 mg | ORAL | @ 05:00:00 | Stop: 2021-08-29

## 2021-08-30 NOTE — Unmapped (Signed)
Pt in bed resting, watching tv. Pt complains of pain uncontrolled by medication. Pt states nothing has helped. Pt has had a snack. Asking if he will be discharged tomorrow, states he doesn't want to stay very long. Expresses worry about pain being the same as before liver transplant.   Problem: Adult Inpatient Plan of Care  Goal: Optimal Comfort and Wellbeing  Outcome: Ongoing - Unchanged     Problem: Diabetes Comorbidity  Goal: Blood Glucose Level Within Targeted Range  Outcome: Ongoing - Unchanged     Problem: Pain Acute  Goal: Acceptable Pain Control and Functional Ability  Outcome: Ongoing - Unchanged     Problem: Adult Inpatient Plan of Care  Goal: Plan of Care Review  Outcome: Progressing  Goal: Patient-Specific Goal (Individualized)  Outcome: Progressing  Goal: Absence of Hospital-Acquired Illness or Injury  Outcome: Progressing  Intervention: Identify and Manage Fall Risk  Recent Flowsheet Documentation  Taken 08/29/2021 2200 by Charlynn Court, RN  Safety Interventions:   fall reduction program maintained   environmental modification   lighting adjusted for tasks/safety   low bed   muscle strengthening facilitated  Taken 08/29/2021 2000 by Charlynn Court, RN  Safety Interventions:   environmental modification   fall reduction program maintained   low bed   lighting adjusted for tasks/safety   muscle strengthening facilitated  Intervention: Prevent Skin Injury  Recent Flowsheet Documentation  Taken 08/29/2021 2000 by Charlynn Court, RN  Skin Protection:   adhesive use limited   tubing/devices free from skin contact  Goal: Readiness for Transition of Care  Outcome: Progressing  Goal: Rounds/Family Conference  Outcome: Progressing     Problem: Hypertension Comorbidity  Goal: Blood Pressure in Desired Range  Outcome: Progressing

## 2021-08-30 NOTE — Unmapped (Deleted)
This patient was not seen in person and the consultation was conducted over the phone or virtually via WebEx or Mychart Bedside. The Diabetes Education Team Consult Service has moved to a hybrid model to minimize potential spread of COVID-19 and protect patients/providers. During this time, we will be limiting person-to-person contact when possible.       Diabetes education consultation: Consulted for assistance with providing instruction on diabetes self-management skills. Visit with Richard [name] via phone at the bedside to discuss diabetes needs.     Assessment :Richard [name] is a 56 y.o. male with hx liver transplant (2013 2/2 NASH cirrhosis), HTN, DM who presented with chest tightness, suprapubic pain, found to have elevated troponin and AST/ALT.   Richard [name] has Diabetes Mellitus Type 2- Insuling requiring  see MAR for medication list. Richard [name] reports home regimen: levemir nightly and novolog SSI AC. Reports no allergies to insulin aspart.  Richard [name] reports that he is adhering to insulin regimen and monitor his glucose via Dexcom CGM. However, recently changed insurance and has not been monitoring his glucose for over two weeks due to needing a refill for Dexcom. Richard [name] reports that he currently does not have a PCP and needs prior authorization for a refill. Richard [name] reports that he has made healthy lifestyle changes to self-manage his diabetes (being active and healthy eating) which has resulted in est. 50 lbs of weight loss.     A1C:    Lab Results   Component Value Date    A1C 8.3 (H) 08/28/2021       Insulin administration & safety:  Richard [name] is currently using insulin pens and reports no barriers to insulin administration.     Requested that clinical nursing staff have Richard [name] inject insulin doses, so he can get practice with self-injection.  Also asked that they review correctional scale as well.     Problem-solving:   Hypoglycemia: Attached clinical referencel: Hypoglycemia  as a resource.  Hyperglycemia:  Attached clinical reference: Hyperglycemia  as resource.      Risk Reduction- Foot Care:  Richard [name] reports having a foot exam.   Discussed principles of good foot care such as daily cleansing, and inspection, avoiding walking barefoot, having well-fitting shoes and no bathroom surgery on feet.  Attached clinicial reference :Diabetes Foot Care Instructions as a resource.  Eye Exam:   Richard [name] is due for eye exam. Reports last exam was over a year ago.      Being Active:  Richard [name] reports prior to admission that he was active and liked to walk.       Healthy Eating:  Richard [name] reports that he is eating smaller portions and has increased his green, leafy vegetable intake.   Discussed with patient meal planning principles for glucose control including the plate method, which foods are carbs and portion control. Reviewed avoiding sugary beverages/concentrated sweet. Reviewed dietary guidelines/ plate method/ avoiding concentrated weets/eating at consistent times.       Insurance: Richard [name] has [employer]. There are not financial barriers to glucose monitoring.     Monitoring: Richard [name] reports using Dexcom G6 fo glucose monitoring. Barrier to monitoring: Richard [name] reports that he does not have a PCP and needs prior authorization for a refill. Richard [name] currently does not have a glucose meter kit and/or testing supplies.    Notified care team regarding  monitoring supplies needed at discharge.     Monitoring Supplies needing prescriptions at discharge:  -Dexcom G6   -Please order glucose meter covered by insurance    Recommendations:   - Please notify diabetes nurse educator of finalized insulin regimen at discharge.  - Assist with establishing PCP/endo for f/u care  - f/u visit with PCP request referral for DSMES (Diabetes Self Management Education and Support) after discharge.     Plan: Will continue to follow while in house and provide additional education.     Thank You,   Thomas Hoff, MSN, RN, Diabetes Nurse Educator- pager: [phone]

## 2021-08-30 NOTE — Unmapped (Signed)
This patient was not seen in person and the consultation was conducted over the phone or virtually via WebEx or Mychart Bedside. The Diabetes Education Team Consult Service has moved to a hybrid model to minimize potential spread of COVID-19 and protect patients/providers. During this time, we will be limiting person-to-person contact when possible.       Diabetes education consultation: Consulted for assistance with providing instruction on diabetes self-management skills. Visit with Richard [name] via phone at the bedside to discuss diabetes needs.     Assessment :Richard [name] is a 56 y.o. male with hx liver transplant (2013 2/2 NASH cirrhosis), HTN, DM who presented with chest tightness, suprapubic pain, found to have elevated troponin and AST/ALT.   Richard [name] has Diabetes Mellitus Type 2- Insuling requiring  see MAR for medication list. Richard [name] reports home regimen: levemir nightly and novolog SSI AC. Reports no allergies to insulin aspart.  Richard [name] reports that he is adhering to insulin regimen and monitors his glucose via Dexcom CGM. However, his  Insurance was recently changed, and he has not been monitoring his glucose for over two weeks due to needing a refill for Dexcom. Richard [name] reports that he currently does not have a PCP and needs prior authorization for a refill. Richard [name] reports that he has made healthy lifestyle changes to self-manage his diabetes (being active and healthy eating) which has resulted in est. 50 lbs of weight loss.     A1C:    Lab Results   Component Value Date    A1C 8.3 (H) 08/28/2021       Insulin administration & safety:  Richard [name] is currently using insulin pens and reports no barriers to insulin administration.     Requested that clinical nursing staff have Richard [name] inject insulin doses, so he can get practice with self-injection.  Also asked that they review correctional scale as well.     Problem-solving: Hypoglycemia:    Attached clinical referencel: Hypoglycemia  as a resource.  Hyperglycemia:  Attached clinical reference: Hyperglycemia  as resource.      Risk Reduction- Foot Care:  Richard [name] reports having a foot exam.   Discussed principles of good foot care such as daily cleansing, and inspection, avoiding walking barefoot, having well-fitting shoes and no bathroom surgery on feet.  Attached clinicial reference :Diabetes Foot Care Instructions as a resource.  Eye Exam:   Richard [name] is due for eye exam. Reports last exam was over a year ago.      Being Active:  Richard [name] reports prior to admission that he was active and liked to walk.       Healthy Eating:  Richard [name] reports that he is eating smaller portions and has increased his green, leafy vegetable intake.   Discussed with patient meal planning principles for glucose control including the plate method, which foods are carbs and portion control. Reviewed avoiding sugary beverages/concentrated sweet. Reviewed dietary guidelines/ plate method/ avoiding concentrated weets/eating at consistent times.       Insurance: Richard [name] has [employer]. There are not financial barriers to glucose monitoring.     Monitoring: Richard [name] reports using Dexcom G6 fo glucose monitoring. Barrier to monitoring: Richard [name] reports that he does not have a PCP and needs prior authorization for a refill. Richard [name] currently does not have a glucose meter kit and/or testing supplies.  Notified care team regarding monitoring supplies needed at discharge.     Monitoring Supplies needing prescriptions at discharge:  -Dexcom G6 sensor and transmitter  -Please order glucose meter covered by insurance    Recommendations:   - Please notify diabetes nurse educator of finalized insulin regimen at discharge.  - Assist with establishing PCP/endo for f/u care  - f/u visit with PCP request referral for DSMES (Diabetes Self Management Education and Support) after discharge.     Plan: Will continue to follow while in house and provide additional education.     Thank You,   Thomas Hoff, MSN, RN, Diabetes Nurse Educator- pager: [phone]

## 2021-08-30 NOTE — Unmapped (Signed)
Attending attestation:   I performed a history and physical examination of the patient and discussed the patient's management with the resident.  I reviewed the resident's note and agree with the documented findings and plan of care. I highlight / emphasize the following: 56yo man with a history of liver transplant for NASH cirrhosis, on cellcept and tacrolimus, CKD 4, DM2, HTN, who presented with c/o intermittent, severe abdominal pain, increasing in frequency over the last 2 weeks, and associated chest tightness with elevated troponin likely reflective of Type II MI. His principal concern is his abdominal pain. He denies association with eating or defecation but does describe urgency and tenesmus that do not lead to a bowel movement. He is still having regular, normal BMs, however. No infectious symptoms. Has been compliant with immunosuppression. Denies exposures such as Tylenol, sick contacts, alcohol. This feels to him like his prior initial presentation for liver failure. Vital signs notable for hypertension and tachycardia. Exam notable for uncomfortable appearing, obese man, shifting on hospital gurney. Cardiopulmonary exam normal, except for tachycardia. He has diffuse, exquisite tenderness on abdominal exam to light palpation, most prominent in RUQ and midepigastrum. The abdomen is soft without guarding or rebound. He has trace pitting edema of the BLE. Data show slightly elevated LFTs from yesterday, and overall stable troponin. Lactate normal. Hb stable. Abdominal imaging unrevealing. Cardiology consulted regarding concern for underlying CAD. Will treat HTN, pain. Etiology of abdominal pain is unclear. Will consult Hepatology and consider additional imaging pending trajectory. I personally spent 90 minutes face-to-face and non-face-to-face in the care of this patient, which includes all pre, intra, and post visit time on the date of service.  All documented time was specific to the E/M visit and does not include any procedures that may have been performed.     Additional details as per the resident. Sallyanne Havers, MD, MPA August 29, 2021 10:37 PM         [facility] History & Physical    Assessment & Plan:   Richard [name] is a 56 y.o. male with hx liver transplant (2013 2/2 NASH cirrhosis), HTN, DM who presented with chest tightness, suprapubic pain, found to have elevated troponin and AST/ALT.     Principal Problem:    Type 2 MI (myocardial infarction) (CMS-HCC)  Active Problems:    Hypertension    Liver replaced by transplant (CMS-HCC)    Morbid obesity with BMI of 45.0-49.9, adult (CMS-HCC)    Immunosuppression due to drug therapy (CMS-HCC)    CKD (chronic kidney disease) stage 4, GFR 15-29 ml/min (CMS-HCC)    Coronary artery disease    Abnormal transaminases  Resolved Problems:    * No resolved hospital problems. *    Type 2 MI I CAD I HTN  Presented with acute onset of several hours of chest tightness that started at rest (now resolved) and elevated troponin, now downtrending. ECG with ST depressions in V1-V4, as well as RBBB and 1st degree AV block. Recently admitted to [facility] 2/11-2/14 for hypertensive episode in which he was found to have elevated troponin, similar ST changes on ECG, and TTE poor quality but with hypokinesis of the basal-mid anteroseptal, basal-mid inferoseptal, and basal-mid inferior myocardium, was planned for cardiac stress MRI but unable to fit in scanner. Overall do have concern for cardiac etiology of his symptoms and feel he needs further workup, but given acute rise in AST/ALT and CKD, coronary angiography is likely contraindicated at the moment.  - continue home ASA  81mg , imdur 30mg  daily, amlodipine 10mg  daily, hydral 25mg  q6  - nitroglycerin prn if further chest pain  - Cardiology consulted    Hx liver transplant 2/2 NASH cirrhosis I Elevated AST/ALT  Presented initially for chest tightness and suprapubic pain (see below), initial liver enzymes unremarkable, but repeat labs show new AST elevation to 311, ALT to 196, Tbili 1.9. Synthetic function intact. Reports taking immunosuppressants as directed, denies any other abdominal symptoms, underwent US liver transplant in ED that was unremarkable and noncontrasted CTAP that was notable only for 2mm nonobstructing nephrolithiasis. Unclear etiology, but did have similar AST/ALT elevation after presentation to [facility] with severe hypertension and type 2 MI last month. Could also consider congestive hepatopathy given recent TTE findings, but not clinically in heart failure. No recent new culprit medications.   - Hepatology consulted  - continue tacrolimus 0.5mg  BID, cellcept 250mg  BID  - Trend hepatic function panel  - Follow up tac trough level    Suprapubic pain  Reports intermittent acute onset suprapubic pain lasting up to an hour over the past few weeks. Found to have 2mm non obstructing R renal calculi, UA with small amount of blood, WBCs. Pain most likely due to nephrolithiasis vs associated bladder spasms. Cr elevated but consistent with recent baseline.  - Urology referral at discharge     Hypokalemia  - Replete and trend    CKD  Cr elevated to 3.25, consistent with recent baseline.  - Trend Cr, minimize nephrotoxins    DM  Reports taking SSI only at home, though discharge from [facility] lists levemir 20u BID as well. A1c is 8.3  - start with SSI only, start standing insulin as needed    Daily Checklist:  Diet: Diabetic Diet  DVT PPx: Not Indicated - Padua Score <4  Electrolytes: Replete Potassium to >/=4 and Magnesium to >/=2  Code Status: Full Code    Chief Concern:   Type 2 MI (myocardial infarction) (CMS-HCC)    Subjective:   HPI:  Richard [name] is a 56 y.o. male with hx liver transplant (2013 2/2 NASH cirrhosis), HTN, DM who presented with chest tightness, suprapubic pain, found to have elevated troponin and AST/ALT.    Patient was recently admitted to [facility] with shortness of breath and RUQ pain, found to be hypertensive to SBP 220s, with symptoms resolving after hypertension was treated with a nicardipine drip. He was also found to have hsTrop elevated to 252 and new ST depressions in V1-V4 on ECG and regional wall motion abnormalities on TTE (though poor quality). He was planned for cardiac stress MRI but could not fit in scanner, so further workup ultimately deferred to outpatient setting. He also had elevated AST/ALT that ultimately resolved without intervention.    He presents here with new onset chest tightness that started abruptly while at rest yesterday, a new symptom for him. It lasted for several hours and ultimately resolved overnight while in the ED. He also reports intermittent severe suprapubic pain which has been occurring every few days for the past few weeks, lasts an hour or so, and then goes away. He denies dyspnea, epigastric or other abdominal pain, dysuria, cough, sore throat, fevers, chills, constipation, diarrhea.    On arrival to the ED he was hypertensive to SBP 198 with other vital signs stable. Workup otherwise notable for ECG unchanged from prior, hsTrop elevated to 213 and then downtrending, Cr 3.25 (similar to baseline), CTAP notable for nonobstructing renal calculus, unremarkable liver US and CXR, and initially  with normal AST/ALT that uptrended to 311 and 196 at recheck.     Designated Environmental health practitioner:  Mr. Wich currently has decisional capacity for healthcare decision-making and is able to designate a surrogate healthcare decision maker. Mr. Early designated healthcare decision maker(s) is/are [name] (the patient's adult sibling) as denoted by stated patient preference.    Allergies:  Insulin asp prt-insulin aspart and Insulin aspart    Medications:   Prior to Admission medications    Medication Dose, Route, Frequency   amLODIPine (NORVASC) 10 MG tablet 10 mg, Oral, Daily (standard)   aspirin (ECOTRIN) 81 MG tablet 81 mg, Daily (standard)   carvediloL (COREG) 6.25 MG tablet 2 tablets, 2 times a day   CHOLECALCIFEROL, VITAMIN D3, (VITAMIN D3 ORAL) 2,000 Units, Daily (standard)   furosemide (LASIX) 40 MG tablet 80 mg, Daily before breakfast   hydrALAZINE (APRESOLINE) 25 MG tablet 25 mg, 3 times a day (standard)   insulin aspart (NOVOLOG FLEXPEN SUBQ) 25-35 Units, Subcutaneous, 3 times a day (with meals), Sliding scale   insulin detemir U-100 (LEVEMIR) 100 unit/mL (3 mL) injection pen 35 Units, Subcutaneous   insulin lispro 100 unit/mL inph 20 Units, Subcutaneous, Daily   isosorbide mononitrate (IMDUR) 30 MG 24 hr tablet 30 mg, Daily before breakfast   magnesium oxide (MAG-OX) 400 mg (241.3 mg elemental magnesium) tablet 400 mg, Oral   meTOLAzone (ZAROXOLYN) 2.5 MG tablet No dose, route, or frequency recorded.   multivitamin (TAB-A-VITE/THERAGRAN) per tablet 1 tablet, Oral, Daily (standard)   multivitamin,tx-iron-minerals (COMPLETE MULTIVITAMIN) Tab Daily (standard)   mycophenolate (CELLCEPT) 250 mg capsule 250 mg, Oral, 2 times a day (standard)   tacrolimus (PROGRAF) 0.5 MG capsule 0.5 mg, Oral, 2 times a day       Medical History:  Past Medical History:   Diagnosis Date   ??? COVID-19 07/18/2019   ??? Diabetes mellitus (CMS-HCC)    ??? Family history of malignant neoplasm of prostate 06/23/2015   ??? Hepatic cirrhosis (CMS-HCC) 06/23/2015    Overview:  Secondary to NASH; followed by Dr. Yevonne Pax, GI.  Last Assessment & Plan:  Relevant Hx: Course: Daily Update: Today's Plan:    ??? Hypertension    ??? Liver cirrhosis secondary to NASH (nonalcoholic steatohepatitis) (CMS-HCC)     s/p liver transplant 2013       Surgical History:  Past Surgical History:   Procedure Laterality Date   ??? liver transpant     ??? LIVER TRANSPLANTATION  2013   ??? PR COLONOSCOPY W/BIOPSY SINGLE/MULTIPLE N/A 08/13/2018    Procedure: COLONOSCOPY, FLEXIBLE, PROXIMAL TO SPLENIC FLEXURE; WITH BIOPSY, SINGLE OR MULTIPLE;  Surgeon: Neysa Hotter, MD;  [facility];  Service: Gastroenterology   ??? PR COLSC FLX W/RMVL OF TUMOR POLYP LESION SNARE TQ N/A 08/13/2018    Procedure: COLONOSCOPY FLEX; W/REMOV TUMOR/LES BY SNARE;  Surgeon: Neysa Hotter, MD;  [facility];  Service: Gastroenterology   ??? PR UPPER GI ENDOSCOPY,BIOPSY N/A 07/13/2015    Procedure: UGI ENDOSCOPY; WITH BIOPSY, SINGLE OR MULTIPLE;  Surgeon: Joaquin Music, MD;  [facility];  Service: Gastroenterology       Family History:   Family History   Problem Relation Age of Onset   ??? Diabetes Father    ??? Diabetes Paternal Grandfather        Social History:  The patient lives with his family in [place], works at a Programme researcher, broadcasting/film/video, does not smoke, drink alcohol, or use other  drugs    Social History     Tobacco Use   ??? Smoking status: Former   ??? Smokeless tobacco: Never   Substance Use Topics   ??? Alcohol use: No   ??? Drug use: No        Review of Systems:  10 systems were reviewed and are negative unless otherwise mentioned in the HPI    Objective:   Physical Exam:  Temp:  [36.4 ??C (97.5 ??F)-36.5 ??C (97.7 ??F)] 36.5 ??C (97.7 ??F)  Heart Rate:  [58-90] 76  SpO2 Pulse:  [59-85] 83  Resp:  [12-23] 17  BP: (125-165)/(66-109) 140/66  SpO2:  [95 %-98 %] 98 %    Gen: WDWN in NAD, answers questions appropriately  Eyes: Sclera anicteric, EOMI  HENT: atraumatic, normocephalic  Neck: no JVD  Heart: RRR, S1, S2, no M/R/G, no chest wall tenderness  Lungs: CTAB, no crackles or wheezes, no use of accessory muscles  Abdomen: Normoactive bowel sounds, soft, NTND, no rebound/guarding, no hepatosplenomegaly  Extremities: no clubbing, cyanosis, or edema: pulses are +2 in bilateral upper and lower extremities  Neuro: CN II-XI grossly intact, no focal deficits.  Skin:  No rashes, lesions on clothed exam  Psych: Alert, oriented, normal mood and affect.     Labs/Studies/Imaging:  Labs, Studies, Imaging from the last 24hrs per EMR and personally reviewed

## 2021-08-31 LAB — HEPATITIS PANEL, ACUTE
HEPATITIS A IGM ANTIBODY: NONREACTIVE
HEPATITIS B CORE IGM ANTIBODY: NONREACTIVE
HEPATITIS B SURFACE ANTIGEN: NONREACTIVE
HEPATITIS C ANTIBODY: NONREACTIVE

## 2021-08-31 LAB — CBC
HEMATOCRIT: 32.9 % — ABNORMAL LOW (ref 39.0–48.0)
HEMOGLOBIN: 11.4 g/dL — ABNORMAL LOW (ref 12.9–16.5)
MEAN CORPUSCULAR HEMOGLOBIN CONC: 34.6 g/dL (ref 32.0–36.0)
MEAN CORPUSCULAR HEMOGLOBIN: 28.6 pg (ref 25.9–32.4)
MEAN CORPUSCULAR VOLUME: 82.6 fL (ref 77.6–95.7)
MEAN PLATELET VOLUME: 7.5 fL (ref 6.8–10.7)
PLATELET COUNT: 198 10*9/L (ref 150–450)
RED BLOOD CELL COUNT: 3.98 10*12/L — ABNORMAL LOW (ref 4.26–5.60)
RED CELL DISTRIBUTION WIDTH: 12.9 % (ref 12.2–15.2)
WBC ADJUSTED: 6.6 10*9/L (ref 3.6–11.2)

## 2021-08-31 LAB — HEPATIC FUNCTION PANEL
ALBUMIN: 3.2 g/dL — ABNORMAL LOW (ref 3.4–5.0)
ALKALINE PHOSPHATASE: 139 U/L — ABNORMAL HIGH (ref 46–116)
ALT (SGPT): 101 U/L — ABNORMAL HIGH (ref 10–49)
AST (SGOT): 46 U/L — ABNORMAL HIGH (ref <=34)
BILIRUBIN DIRECT: 0.2 mg/dL (ref 0.00–0.30)
BILIRUBIN TOTAL: 0.5 mg/dL (ref 0.3–1.2)
PROTEIN TOTAL: 6.5 g/dL (ref 5.7–8.2)

## 2021-08-31 LAB — BASIC METABOLIC PANEL
ANION GAP: 11 mmol/L (ref 5–14)
BLOOD UREA NITROGEN: 37 mg/dL — ABNORMAL HIGH (ref 9–23)
BUN / CREAT RATIO: 11
CALCIUM: 9 mg/dL (ref 8.7–10.4)
CHLORIDE: 103 mmol/L (ref 98–107)
CO2: 27 mmol/L (ref 20.0–31.0)
CREATININE: 3.24 mg/dL — ABNORMAL HIGH
EGFR CKD-EPI (2021) MALE: 22 mL/min/{1.73_m2} — ABNORMAL LOW (ref >=60)
GLUCOSE RANDOM: 242 mg/dL — ABNORMAL HIGH (ref 70–179)
POTASSIUM: 3.7 mmol/L (ref 3.4–4.8)
SODIUM: 141 mmol/L (ref 135–145)

## 2021-08-31 LAB — EBV QUANTITATIVE PCR, BLOOD: EBV VIRAL LOAD RESULT: NOT DETECTED

## 2021-08-31 LAB — TACROLIMUS LEVEL, TROUGH: TACROLIMUS, TROUGH: 2.4 ng/mL — ABNORMAL LOW (ref 5.0–15.0)

## 2021-08-31 MED ORDER — DEXCOM G6 TRANSMITTER DEVICE
0 refills | 0.00000 days | Status: CP
Start: 2021-08-31 — End: 2021-08-31

## 2021-08-31 MED ORDER — BLOOD-GLUCOSE METER
0 refills | 0 days | Status: CP
Start: 2021-08-31 — End: ?

## 2021-08-31 MED ORDER — INSULIN LISPRO (U-100) 100 UNIT/ML SUBCUTANEOUS PEN
Freq: Three times a day (TID) | SUBCUTANEOUS | 0 refills | 167 days | Status: CP
Start: 2021-08-31 — End: ?

## 2021-08-31 MED ORDER — BLOOD GLUCOSE TEST STRIPS
ORAL_STRIP | 0 refills | 0.00000 days | Status: CP
Start: 2021-08-31 — End: 2021-08-31

## 2021-08-31 MED ORDER — INSULIN GLARGINE (U-100) 100 UNIT/ML (3 ML) SUBCUTANEOUS PEN
Freq: Every evening | SUBCUTANEOUS | 0 refills | 150 days | Status: CP
Start: 2021-08-31 — End: ?

## 2021-08-31 MED ORDER — CONTOUR NEXT TEST STRIPS
0 refills | 0 days | Status: CP
Start: 2021-08-31 — End: ?
  Filled 2021-09-01: qty 100, 20d supply, fill #0

## 2021-08-31 MED ORDER — PEN NEEDLE, DIABETIC 32 GAUGE X 5/32" (4 MM)
0 refills | 0.00000 days | Status: CP
Start: 2021-08-31 — End: 2021-08-31

## 2021-08-31 MED ORDER — ISOSORBIDE MONONITRATE ER 30 MG TABLET,EXTENDED RELEASE 24 HR
ORAL_TABLET | Freq: Every day | ORAL | 0 refills | 30.00000 days | Status: CP
Start: 2021-08-31 — End: 2021-08-31
  Filled 2021-09-01: qty 60, 30d supply, fill #0

## 2021-08-31 MED ORDER — INSULIN LISPRO (U-100) 100 UNIT/ML SUBCUTANEOUS SOLUTION
Freq: Four times a day (QID) | SUBCUTANEOUS | 0 refills | 38.00000 days | Status: CN
Start: 2021-08-31 — End: 2021-09-30

## 2021-08-31 MED ORDER — LANCETS
0 refills | 0.00000 days | Status: CN
Start: 2021-08-31 — End: 2021-08-31

## 2021-08-31 MED ORDER — FUROSEMIDE 40 MG TABLET
ORAL_TABLET | Freq: Every day | ORAL | 0 refills | 30 days | Status: CP
Start: 2021-08-31 — End: 2021-08-31

## 2021-08-31 MED ORDER — FUROSEMIDE 20 MG TABLET
ORAL_TABLET | Freq: Every day | ORAL | 0 refills | 30 days | Status: CP
Start: 2021-08-31 — End: 2021-09-30
  Filled 2021-09-01: qty 30, 30d supply, fill #0

## 2021-08-31 MED ORDER — NITROGLYCERIN 0.4 MG SUBLINGUAL TABLET
ORAL_TABLET | SUBLINGUAL | 0 refills | 1 days | Status: CP | PRN
Start: 2021-08-31 — End: ?
  Filled 2021-09-01: qty 25, 7d supply, fill #0

## 2021-08-31 MED ORDER — INSULIN GLARGINE (U-100) 100 UNIT/ML SUBCUTANEOUS SOLUTION
Freq: Every evening | SUBCUTANEOUS | 0 refills | 30 days | Status: CN
Start: 2021-08-31 — End: 2021-09-30

## 2021-08-31 MED ORDER — BLOOD-GLUCOSE METER KIT WRAPPER
0 refills | 0 days | Status: CP
Start: 2021-08-31 — End: 2021-08-31
  Filled 2021-09-01: qty 1, 30d supply, fill #0

## 2021-08-31 MED ORDER — HYDRALAZINE 25 MG TABLET
ORAL_TABLET | Freq: Four times a day (QID) | ORAL | 0 refills | 30 days | Status: CP
Start: 2021-08-31 — End: ?
  Filled 2021-09-01: qty 120, 30d supply, fill #0

## 2021-08-31 MED ORDER — POLYETHYLENE GLYCOL 3350 17 GRAM/DOSE ORAL POWDER
Freq: Two times a day (BID) | ORAL | 0 refills | 30 days | Status: CP
Start: 2021-08-31 — End: 2021-09-30
  Filled 2021-09-01: qty 510, 15d supply, fill #0

## 2021-08-31 MED ORDER — DEXCOM G6 SENSOR DEVICE
0 refills | 0.00000 days | Status: CP
Start: 2021-08-31 — End: 2021-08-31

## 2021-08-31 MED ORDER — HYDROXYZINE HCL 25 MG TABLET
ORAL_TABLET | Freq: Four times a day (QID) | ORAL | 0 refills | 8 days | Status: CP | PRN
Start: 2021-08-31 — End: ?
  Filled 2021-09-01: qty 30, 7d supply, fill #0

## 2021-08-31 MED ORDER — INSULIN SYRINGE U-100 WITH NEEDLE 1/2 ML 31 GAUGE X 1/4" (6 MM)
0 refills | 0 days | Status: CN
Start: 2021-08-31 — End: ?

## 2021-08-31 MED ADMIN — tacrolimus (PROGRAF) capsule 0.5 mg: .5 mg | ORAL | @ 01:00:00

## 2021-08-31 MED ADMIN — aspirin chewable tablet 81 mg: 81 mg | ORAL | @ 14:00:00

## 2021-08-31 MED ADMIN — mycophenolate (CELLCEPT) capsule 250 mg: 250 mg | ORAL | @ 01:00:00

## 2021-08-31 MED ADMIN — amLODIPine (NORVASC) tablet 10 mg: 10 mg | ORAL | @ 14:00:00

## 2021-08-31 MED ADMIN — insulin lispro (HumaLOG) injection 0-20 Units: 0-20 [IU] | SUBCUTANEOUS | @ 18:00:00

## 2021-08-31 MED ADMIN — hydrALAZINE (APRESOLINE) tablet 25 mg: 25 mg | ORAL | @ 10:00:00

## 2021-08-31 MED ADMIN — insulin lispro (HumaLOG) injection 0-20 Units: 0-20 [IU] | SUBCUTANEOUS | @ 02:00:00

## 2021-08-31 MED ADMIN — insulin lispro (HumaLOG) injection 0-20 Units: 0-20 [IU] | SUBCUTANEOUS | @ 14:00:00

## 2021-08-31 MED ADMIN — isosorbide mononitrate (IMDUR) 24 hr tablet 60 mg: 60 mg | ORAL | @ 14:00:00

## 2021-08-31 MED ADMIN — hydrALAZINE (APRESOLINE) tablet 25 mg: 25 mg | ORAL | @ 04:00:00

## 2021-08-31 MED ADMIN — mycophenolate (CELLCEPT) capsule 250 mg: 250 mg | ORAL | @ 14:00:00

## 2021-08-31 MED ADMIN — tacrolimus (PROGRAF) capsule 0.5 mg: .5 mg | ORAL | @ 14:00:00

## 2021-08-31 MED ADMIN — insulin glargine (LANTUS) injection 10 Units: 10 [IU] | SUBCUTANEOUS | @ 01:00:00

## 2021-08-31 MED ADMIN — hydrALAZINE (APRESOLINE) tablet 25 mg: 25 mg | ORAL | @ 18:00:00

## 2021-08-31 NOTE — Unmapped (Signed)
Pt resting In bed. Patient using urinal. Not complaining of pain at this time, has not needed pain medication this shift so far. Care plan reviewed with no questions at this time. Call light in reach, bed low to floor  Problem: Adult Inpatient Plan of Care  Goal: Plan of Care Review  Outcome: Progressing  Goal: Patient-Specific Goal (Individualized)  Outcome: Progressing  Goal: Absence of Hospital-Acquired Illness or Injury  Outcome: Progressing  Intervention: Identify and Manage Fall Risk  Recent Flowsheet Documentation  Taken 08/31/2021 0000 by Charlynn Court, RN  Safety Interventions:   environmental modification   fall reduction program maintained   lighting adjusted for tasks/safety   low bed  Taken 08/30/2021 2200 by Charlynn Court, RN  Safety Interventions:   fall reduction program maintained   environmental modification   low bed   lighting adjusted for tasks/safety   muscle strengthening facilitated  Taken 08/30/2021 2000 by Charlynn Court, RN  Safety Interventions:   low bed   lighting adjusted for tasks/safety   fall reduction program maintained   environmental modification  Intervention: Prevent Skin Injury  Recent Flowsheet Documentation  Taken 08/31/2021 0000 by Charlynn Court, RN  Skin Protection:   adhesive use limited   tubing/devices free from skin contact  Taken 08/30/2021 2200 by Charlynn Court, RN  Skin Protection:   adhesive use limited   tubing/devices free from skin contact  Taken 08/30/2021 2000 by Charlynn Court, RN  Skin Protection:   adhesive use limited   tubing/devices free from skin contact  Goal: Optimal Comfort and Wellbeing  Outcome: Progressing  Goal: Readiness for Transition of Care  Outcome: Progressing  Goal: Rounds/Family Conference  Outcome: Progressing     Problem: Diabetes Comorbidity  Goal: Blood Glucose Level Within Targeted Range  Outcome: Progressing     Problem: Hypertension Comorbidity  Goal: Blood Pressure in Desired Range  Outcome: Progressing     Problem: Pain Acute  Goal: Acceptable Pain Control and Functional Ability  Outcome: Progressing

## 2021-08-31 NOTE — Unmapped (Signed)
Hepatology Consult Service   Progress Note         Assessment & Plan:   Richard [name]??is a 56 y.o.??male??with a PMHx of liver transplantation on 03/02/2012 (Liver)??for Cirrhosis??2/2??[name], HTN, CKD, Obesity, DM??that presented to [facility] with??chest pain, abd pain, elevated troponins and acutely elevated liver enzymes in setting of severely elevated BP. Pt is seen in consultation for??elevated liver enzymes??.  ??  History of liver transplant??- acutely elevated liver enzymes  Patient now has 2 hospital admissions in less than 1 month with acutely elevated liver enzymes. ??Both are in the context of severely elevated blood pressure with reported chest tightness and abdominal pain, as well as elevated troponin with wall motion abnormalities on recent TTE.??Liver transplant ultrasound reassuring. Noncon CT limited, but also with no other acute issue noted. Overall, high suspicion is that his liver enzymes are related to hypertensive emergency. Liver enzymes continue trending down with improved BP control. Today, he had zero abd pain when assessed by our team, with a completely benign abd exam with no ttp/guarding/rebound. Unclear what the etiology of his intermittent abd pain is, though there has been c/f for nephrolithiasis. At this point, low suspicion it is related to the liver. He was asking [place] about discharge at seen at bedside today. No additional recommendations from ou team at this point, and so we will sign off for now. Hopeful he can get home soon.    ??  --??Continue mycophenolate mofetil 250 mg twice daily, tacrolimus 0.5 mg twice daily  --??Trend CMP, INR daily while admitted   --??Repeat TTE with normal LV function, diastolic dysfunction, wall motion abnmlty of inferior wall, dilated RV with normal function. Per cards, no acute intervention indicated at this time.   --??Ongoing monitoring/optimization of blood pressure control   -- I have sent a message to his coordinator and outpt team to have his appointment tomorrow rescheduled with [place].     Thank you for involving [place] in the care of your patient. We will sign-off at this time, please re-contact if additional questions or a new need for consultation arises.     For questions, please contact the on-call fellow for the Hepatology Consult Service at [phone].     Interval History:   No acute events overnight. Reports pain resolved. Feeling well. Asking about discharge.     Objective:   Temp:  [36.6 ??C (97.9 ??F)-37.6 ??C (99.7 ??F)] 36.8 ??C (98.2 ??F)  Heart Rate:  [76-89] 83  Resp:  [16-18] 17  BP: (128-165)/(63-84) 128/63  SpO2:  [95 %-98 %] 98 %    Gen: NAD, answers questions appropriately  Eyes: Sclera anicteric  Abdomen:  soft, NTND, no rebound/guarding   Neuro: Normal speech.   Psych: Alert, normal mood and affect.     Pertinent Labs/Studies Reviewed

## 2021-08-31 NOTE — Unmapped (Signed)
No c/o pain this shift. Tolerating po. Urine strained, no evidence of stone seen. BG monitored, and insulin given as ordered. MD team notified of elevated BG beyond parameters. No new orders given. Xray completed this shift.  Awaiting CT scan. VSS. Will continue to monitor  Problem: Adult Inpatient Plan of Care  Goal: Plan of Care Review  Outcome: Progressing  Goal: Patient-Specific Goal (Individualized)  Outcome: Progressing  Goal: Absence of Hospital-Acquired Illness or Injury  Outcome: Progressing  Intervention: Identify and Manage Fall Risk  Recent Flowsheet Documentation  Taken 08/31/2021 0800 by Palma Holter Danicka Hourihan, RN  Safety Interventions:   low bed   commode/urinal/bedpan at bedside   nonskid shoes/slippers when out of bed  Intervention: Prevent and Manage VTE (Venous Thromboembolism) Risk  Recent Flowsheet Documentation  Taken 08/31/2021 0800 by Nihira Puello L Alessio Bogan, RN  Activity Management: bedrest  Goal: Optimal Comfort and Wellbeing  Outcome: Progressing  Goal: Readiness for Transition of Care  Outcome: Progressing  Goal: Rounds/Family Conference  Outcome: Progressing  Flowsheets (Taken 08/31/2021 1125)  Participants:   case manager   social work/services     Problem: Diabetes Comorbidity  Goal: Blood Glucose Level Within Targeted Range  Outcome: Progressing  Intervention: Monitor and Manage Glycemia  Recent Flowsheet Documentation  Taken 08/31/2021 0800 by Zyon Grout L Elmire Amrein, RN  Glycemic Management: (SEE MAR)   blood glucose monitored   supplemental insulin given     Problem: Pain Acute  Goal: Acceptable Pain Control and Functional Ability  Outcome: Progressing

## 2021-08-31 NOTE — Unmapped (Signed)
Tacrolimus Therapeutic Monitoring Pharmacy Note    Richard [name] is a 56 y.o. male continuing tacrolimus.     Indication: Liver transplant     Date of Transplant: 2013      Prior Dosing Information: Home regimen 0.5mg  bid     Goals:  Therapeutic Drug Levels  Tacrolimus trough goal: 2-4 ng/ml    Additional Clinical Monitoring/Outcomes  ?? Monitor renal function (SCr and urine output) and liver function (LFTs)  ?? Monitor for signs/symptoms of adverse events (e.g., hyperglycemia, hyperkalemia, hypomagnesemia, hypertension, headache, tremor)    Results:   Tacrolimus level: 3.4 ng/mL, drawn appropriately    Pharmacokinetic Considerations and Significant Drug Interactions:  ??? Concurrent hepatotoxic medications: None identified  ??? Concurrent CYP3A4 substrates/inhibitors: None identified  ??? Concurrent nephrotoxic medications: none  ???     Assessment/Plan:  Recommendedation(s)  ??? Continue current regimen of 0.5mg  bid    Follow-up  ??? Daily levels.   ??? A pharmacist will continue to monitor and recommend levels as appropriate    Please page service pharmacist with questions/clarifications.    Candee Furbish, RPh

## 2021-08-31 NOTE — Unmapped (Signed)
Internal Medicine (MEDU) Progress Note    Assessment & Plan:   Richard [name] is a 56 y.o. male with a PMHx of liver transplant (for NASH cirrhosis, on CellCept and tacrolimus), CKD 4, T2DM, HTN, who presented with intermittent, increasingly frequent severe abdominal pain  x2 weeks associated with chest tightness, and found to have elevated blood pressures, troponin, and AST/ALT.  Troponin elevation most likely type II MI 2/2 hypertension.  LFT changes may also be related to hypertension, as they have down trended alongside improved blood pressure control.  However, recurrent episodes of severe abdominal pain raise possibility of other process such as ureteral or bladder spasms (R renal calculus noted on CT) vs. mesenteric ischemia vs. transplant rejection (though less likely, given no history of rejection and transplant U/S reassuring).  Also working up for viral hepatitis, given pt high risk iso immunosuppression which may mask typical infection symptoms & lab findings.      Principal Problem:    Type 2 MI (myocardial infarction) (CMS-HCC)  Active Problems:    Hypertension    Liver replaced by transplant (CMS-HCC)    Morbid obesity with BMI of 45.0-49.9, adult (CMS-HCC)    Immunosuppression due to drug therapy (CMS-HCC)    CKD (chronic kidney disease) stage 4, GFR 15-29 ml/min (CMS-HCC)    Coronary artery disease    Abnormal transaminases  Resolved Problems:    * No resolved hospital problems. *    Abdominal & suprapubic pain: 3/7 continues to have severe, intermittent abdominal pain.  During episodes, patient with diffuse tenderness to palpation on exam but no peritoneal signs.  In between episodes, symptoms resolved completely and patient with mild epigastric tenderness but otherwise benign exam.  No CVA tenderness.  CT A/P 3/6 notable for several nonobstructing R renal calculi; normal caliber aorta; no evidence of bowel obstruction, intra-abdominal fluid, or free air.  Liver transplant U/S 3/6 with echotexture similar to prior 04/2020, and patent blood vessels with normal flow. Abdominal pain possibly due to nephrolithiasis associated with ureteral vs. bladder spasms.  Also considering mesenteric ischemia vs. viral hepatitis (less likely given absence of infectious symptoms or lab evidence, though immunosuppression may mask).  Transplant rejection thought to be less likely (Tac level appropriate on admission, no history of rejection, reassuring ultrasound) but remains on differential as patient describes similar presentation leading up to liver transplant.  - clean catch UA, UCx  - mesenteric  PVL  - KUB  - viral hepatitis panel, EBV, CMV  - Blood Cx x2 (given immunosuppression may mask other s/s intraabd infxn)  - miralax daily  - Pain mgmt:   - oxycodone 5-10mg  Q6H PRN pain   - dilaudid 1.5mg  IV Q3H PRN severe pain   - tylenol 1000mg  Q6H  PRN mild pain  -Consider urology consult if above work-up inconclusive    Hx liver transplant 2/2 NASH cirrhosis I Elevated AST/ALT: Mild LFT elevation on admission most likely related to hypertensive emergency.  3/7  AST/ALT downtrend in setting of improved blood pressure control reassuring.Transplant rejection thought to be less likely (Tac level appropriate on admission, no history of rejection, reassuring ultrasound) but remains on differential as patient describes similar presentation leading up to liver transplant.  Also consider viral hepatitis.  Tylenol and EtOH levels normal, reassuring against toxic ingestion.  - Hepatology consulted, appreciate recs  - no indication for emergent transplant Bx  - Home mycophenolate mofetil 250 mg BID  - Home tacrolimus 0.5mg  BID  - atarax PRN itching  -  Daily Tac trough  - daily CMP, INR    type 2 MI, Hypertensive emergency  Hx hypertension  CAD, HFpEF  (EF 61% 07/2021)  Prior TTE 07/2021 showed LVEF 61%, and WMA's.  This admission, troponin peak 213, plateaued ~200's; delayed downtrend most likely due to impaired renal clearance in setting of CKD 4.  3/7 No evidence of volume overload on exam, no further episodes of chest tightness.  -Cardiology consulted, appreciate recs  - ASA 81 mg daily  - holding home correg -- consider restarting 3/8  - home amlodipine 10mg  daily  - hydralazine 25mg  QID  - nitroglycerin PRN chest pain  - f/u TTE (eval for WMA's  - f/u lipid panel  -Plan to start statin, once cleared by hepatology  -Consider starting low-dose lasix pending clinical trajectory    CKD4: U/S liver transplant 3/6 notable for atrophic and echogenic kidneys, suggestive of medial renal disease.  -Daily BMP, Mag, Phos    T2DM: A1c 8.3.  On basal & mealtime insulin at home. Home insulin held on admission given abdominal pain which may interfere with ability to take reliable p.o. in setting of abdominal pain. 3/7  Tolerating PO and BG 200's.  - start lantus 10u nightly  - SSI  - diabetes education consult    Daily Checklist:  Diet: Regular Diet  DVT PPx: SCD's  Electrolytes: caution given CKD  Code Status: Full Code  Dispo: continue current level of care    Team Contact Information:   Primary Team: Internal Medicine (MEDU)  Primary Resident: Kathlee Nations, MD  Resident's Pager: [phone] (Gen MedU Intern - [name])    Interval History:   Patient continues to experience intermittent episodes of severe abdominal pain.  Not much relief with p.o. opioids.  Responsive to Dilaudid 1.5mg   IV + tylenol.  In between episodes, pain resolves completely.  During abdominal pain episodes, patient describes pain as starting in lower abdomen/suprapubic region and radiating upward towards epigastrium.    Objective:     Temp:  [36.5 ??C (97.7 ??F)-36.8 ??C (98.2 ??F)] 36.6 ??C (97.9 ??F)  Heart Rate:  [76-94] 86  Resp:  [18] 18  BP: (133-158)/(66-89) 158/73  SpO2:  [94 %-98 %] 97 %,   Intake/Output Summary (Last 24 hours) at 08/30/2021 1802  Last data filed at 08/30/2021 0423  Gross per 24 hour   Intake --   Output 350 ml   Net -350 ml   , No intake/output data recorded., Body mass index is 35.93 kg/m??.,   Wt Readings from Last 3 Encounters:   08/30/21 (!) 113.6 kg (250 lb 6.4 oz)   05/04/20 (!) 138.8 kg (306 lb)   05/02/19 (!) 117.9 kg (260 lb)       On exam approximately 0715, when not having abdominal pain:    Gen: Awake, alert, in NAD.  Nontoxic.  Lying comfortably in bed with HOB elevated.  HEENT: EOMI, PERRL, no scleral icterus, no sublingual jaundice.  Heart: RRR  Lungs: Normal work of breathing on room air, CTAB  Abdomen: NABS, mild tenderness to palpation in epigastric region, otherwise nontender, no guarding/rigidity/rebound  MSK/Ext: WWP, distal pulses intact in bilateral UE and LE  Skin: No jaundice or spider angiomas; small bruise at site of insulin injections on RLQ, otherwise no abdominal or flank ecchymoses  Neuro: Face symmetric, moves all extremities equally  Psych: Anxious affect      Labs/Studies: Labs and Studies from the last 24hrs per EMR and Reviewed

## 2021-08-31 NOTE — Unmapped (Incomplete)
{  Please include brief summary - this text will automatically delete upon signing:[id number]}      DIVISION OF CARDIOLOGY  [employer] [place]        Date of Service: 08/31/21      CARDIOLOGY {initial_follow ZO:10960} CONSULT  NOTE    Requesting Physician: Sallyanne Havers, MD   Requesting Service: Med General Doristine Counter (MDU)   Consulting Resident: Mary Sella. Dorothia Passmore, MD, PhD     [name] is a 56 y.o. male with a PMHx liver transplant (2013 2/2 NASH cirrhosis), HTN, prior diagnosis of HFpEF (EF 61% 07/2021), OSA, DM2, and CKD4 who presented with chest tightness, suprapubic pain, found to have elevated troponin and AST/ALT.??Cardiology consulted for elevated troponins in the setting of chest pain and hypertension.    Assessment/Plan:  Tropionemia - Chest Tightness - Prior diagnosis of HFpEF (EF 61% 07/2021)  EKG reassuring and stable to prior, strongly suggesting no active ACS. TTE notable for stable EF 55-60% since prior exam. Mild-mod  RV dilation and mild-mod biatrial enlargement. No new wall abnormalities found. These findings are consistent with type 2 demand ischemia, requiring no further inpatient cardiac work up.  -agree with continuing ASA 81 mg PO QD    Hypertriglyceridemia - 10-year ASCVD risk 14.9%  Triglycerides elevated to 169. Given underlying risk factors, including age >36 and DM2, he would qualify management with 3-omega fatty acid. Risk factors and calculated ASCVD risk supports initiating moderate intensity statin therapy if hepatology is agreeable given h/o liver transplantation.  -recommend initiating omega 3-fatty acid for triglyceride control  -recommend initiating 20 mg atorvastatin QPM    Resistant HTN  Has experienced multiple episodes of hypertensive emergency/crisis in the past month, necessitating medical optimization this admission. Will ultimately defer to primary team for management. There is no contraindication for beta blockade as part of his anti-hypertensive regimen at this time. Given history of transient edema in BLEs, there may be a role for gentle PO diuresis as part of his antihypertensive regimen.  ??  DM2 - A1c 8.3  Will defer to primary team for management.  ??  Elevated LFTs  While suspicion is that this is 2/2 to hypertension, will defer to primary team and hepatology.      {Coding tips - Do not edit this text, it will automatically delete upon signing   Document shared decision making discussion with the patient/family if you recommended LHC  Document in your note if you recommended DNR, hospice, no escalation    Choose diagnoses from the list below. When you're done, click the blank option at the bottom.  Only one chronic illness with severe exacerbation, or one acute cardiac illness is needed to bill for high Medical Decision Making  :[id number]}    Diagnoses addressed on this consultation:  Type 2 MI, acute.       I discussed the plan with the primary team via phone    Thank you for this consult. This patient was discussed with the consult attending. Please see attending attestation for further insights into management. If any further questions arise please page the cardiology consult pager [phone]) Monday - Friday from 8AM-5PM or the on-call cardiology pager [phone]) nights and weekends.

## 2021-08-31 NOTE — Unmapped (Signed)
Hepatology Consult Service   Progress Note         Assessment & Plan:   Richard [name] is a 56 y.o. male with a PMHx of liver transplantation on 03/02/2012 (Liver)??for Cirrhosis 2/2 [name], HTN, CKD, Obesity, DM that presented to [facility] with chest pain, abd pain, elevated troponins and acutely elevated liver enzymes in setting of severely elevated BP. Pt is seen in consultation for elevated liver enzymes .  ??  History of liver transplant - acutely elevated liver enzymes  Patient now has 2 hospital admissions in less than 1 month with acutely elevated liver enzymes.  Both are in the context of severely elevated blood pressure with reported chest tightness and abdominal pain, as well as elevated troponin with wall motion abnormalities on recent TTE. Liver transplant ultrasound reassuring. Noncon CT limited, but also with no other acute issue noted. Overall, high suspicion is that his liver enzymes are related to hypertensive emergency. Liver enzymes already trending down with improved BP control.    ??  -- Continue mycophenolate mofetil 250 mg twice daily, tacrolimus 0.5 mg twice daily  -- Obtain daily tac trough level   -- Trend CMP, INR daily  -- Repeat TTE pending  -- Ongoing monitoring/optimization of blood pressure control    Thank you for involving [place] in the care of your patient. We will continue to follow along with you.     For questions, please contact the on-call fellow for the Hepatology Consult Service at [phone].     Interval History:   No acute events overnight. Having more pain this AM, but then resolved on reassessment with [place] later in day. Sounds like concern for possible kidney stones.     Objective:   Temp:  [36.5 ??C (97.7 ??F)-36.8 ??C (98.2 ??F)] 36.6 ??C (97.9 ??F)  Heart Rate:  [76-94] 86  Resp:  [18] 18  BP: (133-158)/(66-89) 158/73  SpO2:  [94 %-98 %] 97 %    Gen: WDWN male , answers questions appropriately  Abdomen: Soft, some reported TTP in lower abd   Extremities: MAEE     Pertinent Labs/Studies Reviewed

## 2021-08-31 NOTE — Unmapped (Unsigned)
[facility]  [facility]  [address] [place]  [place], [place]  [address]  Ph: [phone]  Fax: [phone]     TRANSPLANT HEPATOLOGY FOLLOW UP CLINIC NOTE    PRIMARY CARE PROVIDER: No PCP Per Patient  Patient Care Team:  None Per Patient Pcp as PCP - General  Genia Harold, RN as Registered Nurse (Transplant)  Joice Lofts Georgeanna Harrison, NP as Nurse Practitioner (Internal Medicine)  Lynnette Caffey, MD as Attending Provider (Nephrology)  Abby Vernie Shanks, MD as Attending Provider (Family Medicine)  Jordan Likes, CPP as Pharmacist (Pharmacist)    DATE OF SERVICE: 09/01/2021    ASSESSMENT AND PLAN:   Richard [name] is a 56 y.o. male who underwent liver transplantation on 03/02/2012 (Liver)  No history of rejection or biliary issues. 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       Immunosuppression:  -tacrolimus  -mycophenolate 180 mg BID     Stage IV CKD /HTN:  -followed by [facility] (Dr Rolanda Jay)  -***        Preventative Health:   - Dermatology: This patient is at increased risk for the development of skin cancers due to immunosuppressant medications. Recommend yearly surveillance.  -DEXA scan last done: 2013. normal Recommended that patients have bone mineral density testing of the lumbar spine and hips by DEXA every 2 years. Due now. Defer to PCP  - Colonoscopy: done 07/2018. Adenomatous, sessile serrated polyp removed.       Vaccinations: We recommend that patients have vaccinations to prevent various infections that can occur, especially in the setting of having underlying liver disease. The following vaccinations should be given:  -Hepatitis A:   -Hepatitis B:   -Influenza (yearly):  -Pneumococcal: PPSV23 and PCV13  -Zoster: Shingrix (age > 27)  -SARS-CoV-2: {TFHSARSVac:90034}  ***, recommend bivalent COVID vaccine, you can obtain at your local pharmacy or primary care physician    I spent a majority of my time today with the patient reviewing historical data, old records (including faxed data and data in Care Everywhere), performing a physical examination, and formulating an assessment and plan together with the patient.     No follow-ups on file.    [name], NP  [facility]  Division of Gastroenterology &Hepatology      Subjective      REASON FOR VISIT: Annual Transplant follow up  HISTORY OF PRESENT ILLNESS:      Richard [name] is a 56 y.o. male who underwent liver transplantation on 03/02/2012 (Liver) (9 years 6 months ago) for Cirrhosis: Fatty Liver (NASH). No history of rejection or biliary issues. 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     Interval Hx:     Patient was last seen on 05/04/2020. Since that time, he was hospitalized from 5/15-18/2022 for generalized abdominal pain, group b strep bacteremia and splenic infarct from possible septic emboli.   ***    Objective      MEDICATIONS:   Current Outpatient Medications   Medication Instructions   ??? amLODIPine (NORVASC) 10 mg, Oral, Daily (standard)   ??? aspirin (ECOTRIN) 81 mg, Daily (standard)   ??? CHOLECALCIFEROL, VITAMIN D3, (VITAMIN D3 ORAL) 2,000 Units, Daily (standard)   ??? furosemide (LASIX) 40 mg, Daily before breakfast   ??? hydrALAZINE (APRESOLINE) 25 mg,  3 times a day (standard)   ??? insulin aspart (NOVOLOG FLEXPEN SUBQ) 25-35 Units, Subcutaneous, 3 times a day (with meals), Sliding scale   ??? insulin detemir U-100 (LEVEMIR) 35 Units, Subcutaneous   ??? insulin lispro 20 Units, Subcutaneous, Daily   ??? isosorbide mononitrate (IMDUR) 30 mg, Daily before breakfast   ??? magnesium oxide (MAG-OX) 400 mg, Oral   ??? multivitamin (TAB-A-VITE/THERAGRAN) per tablet 1 tablet, Oral, Daily (standard)   ??? mycophenolate (CELLCEPT) 250 mg, Oral, 2 times a day (standard)   ??? PROGRAF 0.5 mg, Oral, 2 times a day       VITAL SIGNS: There were no vitals taken for this visit.  There is no height or weight on file to calculate BMI.  Wt Readings from Last 3 Encounters:   08/30/21 (!) 113.6 kg (250 lb 6.4 oz)   05/04/20 (!) 138.8 kg (306 lb)   05/02/19 (!) 117.9 kg (260 lb)     PHYSICAL EXAM: ***  Constitutional: Alert, no acute distress, with good coloring  HENT: conjunctiva clear, anicteric, nares without discharge, neck supple  CV: Regular rate and rhythm  Lung: respirations even and unlabored  Abdomen: soft, non-distended, non-tender.  No ascites.   Extremities: No edema, well perfused  Neuro: No focal deficits. No asterixis.   Mental Status: Thought organized, appropriate affect, engaged in conversation    DIAGNOSTIC STUDIES:  I have reviewed all pertinent diagnostic studies, including:  Laboratory results:  Lab Results   Component Value Date    WBC 6.5 08/30/2021    HGB 11.9 (L) 08/30/2021    MCV 82.1 08/30/2021    PLT 217 08/30/2021    Lab Results   Component Value Date    NA 141 08/30/2021    K 3.8 08/30/2021    CL 102 08/30/2021    CREATININE 3.58 (H) 08/30/2021    GLU 187 (H) 08/30/2021    CALCIUM 9.1 08/30/2021      Lab Results   Component Value Date    ALBUMIN 3.3 (L) 08/30/2021    AST 100 (H) 08/30/2021    ALT 152 (H) 08/30/2021    ALKPHOS 144 (H) 08/30/2021    BILITOT 0.7 08/30/2021    Lab Results   Component Value Date    INR 1.02 08/28/2021        Lab Results   Component Value Date    HAV POSITIVE 12/19/2011    HBSAG Nonreactive 04/14/2015    HEPBSAB Nonreactive 04/14/2015    HEPBCAB Nonreactive 04/14/2015    HEPCAB Nonreactive 04/14/2015    HIV Nonreactive 07/10/2015       Radiographic studies: Individually reviewed  ***

## 2021-09-01 LAB — HEPATIC FUNCTION PANEL
ALBUMIN: 3.2 g/dL — ABNORMAL LOW (ref 3.4–5.0)
ALKALINE PHOSPHATASE: 119 U/L — ABNORMAL HIGH (ref 46–116)
ALT (SGPT): 74 U/L — ABNORMAL HIGH (ref 10–49)
AST (SGOT): 37 U/L — ABNORMAL HIGH (ref <=34)
BILIRUBIN DIRECT: 0.3 mg/dL (ref 0.00–0.30)
BILIRUBIN TOTAL: 0.9 mg/dL (ref 0.3–1.2)
PROTEIN TOTAL: 6.5 g/dL (ref 5.7–8.2)

## 2021-09-01 LAB — BASIC METABOLIC PANEL
ANION GAP: 9 mmol/L (ref 5–14)
BLOOD UREA NITROGEN: 35 mg/dL — ABNORMAL HIGH (ref 9–23)
BUN / CREAT RATIO: 12
CALCIUM: 9.1 mg/dL (ref 8.7–10.4)
CHLORIDE: 105 mmol/L (ref 98–107)
CO2: 27 mmol/L (ref 20.0–31.0)
CREATININE: 2.85 mg/dL — ABNORMAL HIGH
EGFR CKD-EPI (2021) MALE: 26 mL/min/{1.73_m2} — ABNORMAL LOW (ref >=60)
GLUCOSE RANDOM: 200 mg/dL — ABNORMAL HIGH (ref 70–179)
POTASSIUM: 3.2 mmol/L — ABNORMAL LOW (ref 3.4–4.8)
SODIUM: 141 mmol/L (ref 135–145)

## 2021-09-01 LAB — CBC
HEMATOCRIT: 36.6 % — ABNORMAL LOW (ref 39.0–48.0)
HEMOGLOBIN: 12.2 g/dL — ABNORMAL LOW (ref 12.9–16.5)
MEAN CORPUSCULAR HEMOGLOBIN CONC: 33.2 g/dL (ref 32.0–36.0)
MEAN CORPUSCULAR HEMOGLOBIN: 27.6 pg (ref 25.9–32.4)
MEAN CORPUSCULAR VOLUME: 82.9 fL (ref 77.6–95.7)
MEAN PLATELET VOLUME: 7.5 fL (ref 6.8–10.7)
PLATELET COUNT: 207 10*9/L (ref 150–450)
RED BLOOD CELL COUNT: 4.41 10*12/L (ref 4.26–5.60)
RED CELL DISTRIBUTION WIDTH: 13.1 % (ref 12.2–15.2)
WBC ADJUSTED: 6.9 10*9/L (ref 3.6–11.2)

## 2021-09-01 LAB — TACROLIMUS LEVEL, TROUGH: TACROLIMUS, TROUGH: 4.4 ng/mL — ABNORMAL LOW (ref 5.0–15.0)

## 2021-09-01 MED ORDER — BAQSIMI 3 MG/ACTUATION NASAL SPRAY
Freq: Once | NASAL | 0 refills | 0 days | Status: CP | PRN
Start: 2021-09-01 — End: ?
  Filled 2021-09-01: qty 2, 2d supply, fill #0

## 2021-09-01 MED ORDER — FIASP FLEXTOUCH U-100 INSULIN 100 UNIT/ML (3 ML) SUBCUTANEOUS PEN
Freq: Three times a day (TID) | SUBCUTANEOUS | 0 refills | 0.00000 days | Status: CP
Start: 2021-09-01 — End: 2021-09-01
  Filled 2021-09-01: qty 15, 53d supply, fill #0

## 2021-09-01 MED ORDER — INSULIN DEGLUDEC (U-100) 100 UNIT/ML (3 ML) SUBCUTANEOUS PEN
Freq: Every day | SUBCUTANEOUS | 1 refills | 125.00000 days | Status: CP
Start: 2021-09-01 — End: 2021-09-01
  Filled 2021-09-01: qty 15, 90d supply, fill #0

## 2021-09-01 MED ORDER — INSULIN ASPART (U-100) 100 UNIT/ML (3 ML) SUBCUTANEOUS PEN
Freq: Three times a day (TID) | SUBCUTANEOUS | 0 refills | 125 days | Status: CP
Start: 2021-09-01 — End: 2021-09-01

## 2021-09-01 MED ORDER — ATORVASTATIN 40 MG TABLET
ORAL_TABLET | Freq: Every evening | ORAL | 0 refills | 30 days | Status: CP
Start: 2021-09-01 — End: 2021-10-01
  Filled 2021-09-01: qty 30, 30d supply, fill #0

## 2021-09-01 MED ADMIN — insulin lispro (HumaLOG) injection 0-20 Units: 0-20 [IU] | SUBCUTANEOUS | @ 03:00:00

## 2021-09-01 MED ADMIN — amLODIPine (NORVASC) tablet 10 mg: 10 mg | ORAL | @ 14:00:00 | Stop: 2021-09-01

## 2021-09-01 MED ADMIN — hydrALAZINE (APRESOLINE) tablet 25 mg: 25 mg | ORAL | @ 17:00:00 | Stop: 2021-09-01

## 2021-09-01 MED ADMIN — hydrALAZINE (APRESOLINE) tablet 25 mg: 25 mg | ORAL | @ 11:00:00 | Stop: 2021-09-01

## 2021-09-01 MED ADMIN — mycophenolate (CELLCEPT) capsule 250 mg: 250 mg | ORAL | @ 01:00:00

## 2021-09-01 MED ADMIN — hydrALAZINE (APRESOLINE) tablet 25 mg: 25 mg | ORAL | @ 01:00:00

## 2021-09-01 MED ADMIN — insulin lispro (HumaLOG) injection 0-20 Units: 0-20 [IU] | SUBCUTANEOUS | @ 17:00:00 | Stop: 2021-09-01

## 2021-09-01 MED ADMIN — tacrolimus (PROGRAF) capsule 0.5 mg: .5 mg | ORAL | @ 01:00:00

## 2021-09-01 MED ADMIN — hydrALAZINE (APRESOLINE) tablet 25 mg: 25 mg | ORAL | @ 05:00:00 | Stop: 2021-09-01

## 2021-09-01 MED ADMIN — mycophenolate (CELLCEPT) capsule 250 mg: 250 mg | ORAL | @ 14:00:00 | Stop: 2021-09-01

## 2021-09-01 MED ADMIN — tacrolimus (PROGRAF) capsule 0.5 mg: .5 mg | ORAL | @ 14:00:00 | Stop: 2021-09-01

## 2021-09-01 MED ADMIN — atorvastatin (LIPITOR) tablet 40 mg: 40 mg | ORAL | @ 01:00:00

## 2021-09-01 MED ADMIN — aspirin chewable tablet 81 mg: 81 mg | ORAL | @ 14:00:00 | Stop: 2021-09-01

## 2021-09-01 MED ADMIN — insulin glargine (LANTUS) injection 10 Units: 10 [IU] | SUBCUTANEOUS | @ 03:00:00

## 2021-09-01 MED ADMIN — insulin lispro (HumaLOG) injection 0-20 Units: 0-20 [IU] | SUBCUTANEOUS | @ 14:00:00 | Stop: 2021-09-01

## 2021-09-01 MED ADMIN — isosorbide mononitrate (IMDUR) 24 hr tablet 60 mg: 60 mg | ORAL | @ 14:00:00 | Stop: 2021-09-01

## 2021-09-01 MED FILL — MICROLET LANCET: 20 days supply | Qty: 100 | Fill #0

## 2021-09-01 MED FILL — TRUEPLUS PEN NEEDLE 32 GAUGE X 5/32" (4 MM): 25 days supply | Qty: 100 | Fill #0

## 2021-09-01 NOTE — Unmapped (Incomplete)
Pharmacist Discharge Note  Patient Name: Richard [name]  Reason for admission: Type 2 MI  Reason for writing this note: new diagnosis with new medication, significant event or rationale that led to medication change, barriers to adherence    Highlighted medication changes with rationale (if applicable):    CAD/Type 2 MI, HTN, CHF:  - Start atorvastatin 40 mg daily  - Start nitroglycerin 0.4 mg SL q5 minutes PRN chest pain (max 3 doses in 15 minutes)  - Increase isosorbide mononitrate to 60 mg (two 30 mg tablets) daily  - Continue aspirin 81 mg daily   - Continue amlodipine 10 mg daily     HTN:  - Increase isosorbide mononitrate as above  - Continue amlodipine 10 mg daily  - Continue hydralazine 25 mg QID     CHF:  - Decrease furosemide from 40 mg daily to 20 mg daily      Hx liver transplant:  Continue:  - mycophenolate 250 mg PO BID  - tacrolimus 0.5 mg PO BID    Start:   - hydroxyzine 25 mg q6h PRN itching, anxiety       T2DM:  Unclear DM regimen prior to admission - patient reported taking only sliding scale insulin with unclear scale. Numerous previous insulin prescriptions discontinued and patient discharged on the following insulin regimen based on insurance coverage:  - glucagon (Baqsimi) 3 mg/actuation spray  - insulin aspart, niacinamide (Fiasp) 4 units subQ TID plus sliding scale  - insulin degludec Evaristo Bury) 12 units subQ daily     Patient prescribed blood glucose monitor, test strips and lancets on discharge. Previously utilizing Dexcom CGM system, however insurance has not been able to receive from the pharmacy as his insurance has recently changed and insurance authorization required.     Medication access:  - Patient's insurance has recently changed. He reported only taking sliding scale insulin for diabetes management prior to admission, he reported using Novolog pens, Levemir pens, and insulin lispro. He did not report taking Ozempic but does have a recent fill on 08/01/21. Insurance is requiring different insulin products than he reported on admission.     Outpatient follow-up:  [ ]  Patient provided with glucose meter, test strips, and lancets for blood glucose monitoring. Dexcom required a PA so patient may need instructions for how to acquire Dexcom.    [ ]  *** within *** week(s)  [ ]  Medication access: ***    Philip Aspen   PharmD Candidate    Future Appointments   Date Time Provider Department Center   09/16/2021  8:30 AM Linnell Fulling, ANP [facility]   09/19/2021  8:30 AM Burgess Estelle, MD [facility]   10/14/2021  9:00 AM Rexene Agent, MD [facility]

## 2021-09-01 NOTE — Unmapped (Signed)
 Physician Discharge Summary [facility]  1 [facility] OBSERVATION [facility]  [address]  [place] [address]  Dept: [phone]  Loc: [phone]     Identifying Information:   Richard [name]  06-13-1966  [id number]    Primary Care Physician: No PCP Per Patient     Code Status: Full Code    Admit Date: 08/28/2021    Discharge Date: 09/01/2021     Discharge To: Home    Discharge Service: [facility] - General Medicine Floor Team (MEDU)     Discharge Attending Physician: Lilia Mater, MD    Discharge Diagnoses:   Principal Problem:    Type 2 MI (myocardial infarction) (CMS-HCC) POA: Yes  Active Problems:    Hypertension POA: Yes    Liver replaced by transplant (CMS-HCC) POA: Not Applicable    Morbid obesity with BMI of 45.0-49.9, adult (CMS-HCC) POA: Not Applicable    Immunosuppression due to drug therapy (CMS-HCC) POA: Not Applicable    CKD (chronic kidney disease) stage 4, GFR 15-29 ml/min (CMS-HCC) POA: Yes    Coronary artery disease POA: Yes    Abnormal transaminases POA: Yes  Resolved Problems:    * No resolved hospital problems. *      Hospital Course:   Richard [name] is a 56 y.o. male with a PMHx of liver transplant (for NASH cirrhosis, on CellCept  and tacrolimus ), CKD 4, T2DM, HTN, who presented with intermittent, increasingly frequent severe abdominal pain  x2 weeks associated with chest tightness, and was found to have elevated blood pressures, troponin, and AST/ALT.  Troponin elevation was attributable to type II MI 2/2 hypertension. LFT changes were also most likely related to hypertension, as they downtrended alongside improved blood pressure control. A definitive explanation for episodic, severe abdominal pain was not found-- however patient's presentation was overall most consistent with ureteral or bladder spasms in setting of nephrolithiasis (given small R renal calculi noted on admission CT). Discharge was complicated by poor glycemic control requiring insulin  titration, and challenges with insurance coverage for home insulin  supplies. He was discharged home with plan for follow-up with PCP, liver transplant clinic, urology, and cardiology.    Hospital course by problem as follows:    Type 2 MI, Hypertensive emergency  Hx hypertension  CAD, HFpEF  Presented with acute onset chest tightness that started at rest, last several hours, and resolved by time of admission. No further chest pain or tightness throughout admission. troponin peak 213, plateaued ~200's; delayed downtrend most likely due to impaired renal clearance in setting of CKD 4. ECG with ST depressions in V1-V4, as well as RBBB and 1st degree AV block.   TTE 08/30/2021 showed LVEF 5-60%, concentric LVH, grade 2 diastolic dysfunction, moderate RV dilatation, mild/moderate biatrial enlargement, and segmental hypokinesis of the mid inferior LV wall.  Cardiology was consulted on admission, determined that there was no indication for emergent cardiac cath.  Recommended blood pressure optimization.  Patient was discharged home with plan for follow-up with PCP and cardiology.   He was discharged home on the following medication regimen:  - ASA 81 mg daily  - home amlodipine  10mg  daily  - hydralazine  25mg  QID  - imdur  60mg  daily  - nitroglycerin  PRN chest pain  - furosemide  20mg  PO daily  -Atorvastatin  40 mg daily     Hx liver transplant 2/2 NASH cirrhosis I Elevated AST/ALT:  Presented initially for chest tightness and suprapubic pain (see below), initial liver enzymes unremarkable, but repeat labs show new AST elevation to 311,  ALT to 196, Tbili 1.9. Synthetic function intact. Hepatology was consulted, and determined that mild LFT elevation on admission most likely related to hypertensive emergency; AST/ALT downtrend in setting of improved blood pressure control corroborated this.  Transplant rejection was deemed unlikely, given LFT downtrend, drug level appropriate on admission, and reassuring liver/transplant ultrasound (with normal vasculature/flow). CMV & Viral hepatitis panel negative. EToH and Tylenol  levels on admission negative.  EBV positive, consistent with known history. He was continued on home tacrolimus  and mycophenolate  mofetil. He was discharged home with plan for follow-up in liver transplant clinic.     Abdominal & suprapubic pain: presented with severe, intermittent abdominal pain.  During episodes, patient with diffuse tenderness to palpation on exam but no peritoneal signs.  In between episodes, symptoms resolved completely and patient with mild epigastric tenderness but otherwise benign exam.  No CVA tenderness.  CT A/P 3/6 notable for several nonobstructing R renal calculi; normal caliber aorta; no evidence of bowel obstruction, intra-abdominal fluid, or free air.  Liver transplant U/S 3/6 with echotexture similar to prior 04/2020, and patent blood vessels with normal flow. Mesenteric ischemia was considered, but deemed unlikely given normal lactate and pain not associated with eating. Patient's presentation was overall most consistent with ureteral or bladder spasms in setting of nephrolithiasis (given small R renal calculi noted on admission CT, and sterile pyuria on UA). He initially required pain control with multiple doses of as-needed opioids (PO and IV) and tylenol . Pain resolved on hospital day 2, and pt was abdominal pain-free >48hr by time of discharge, without any medications for pain.    T2DM: A1c 8.3. Initially managed with SSI, then transitioned to basal/bolus insulin  regimen once tolerating reliable p.o.  Hospital course complicated by persistent hyperglycemia with blood sugars 200-300s.   He was discharged home the following insulin  regimen:  -- insulin  tresiba  12u nightly (every night)  --insulin  fiasp  4u three times a day before meals  -- insulin  fiasp , sliding scale 4 times per day, with meals and at bedtime:   BG 151-200 = 1 units,   BG 201-250 = 2 units,   BG 251-300 = 3 units,   BG >300 = 4 units   Insulin  will likely need to be uptitrated as outpatient.  Usually uses a Dexcom at home, but this requires a prior Auth with his new insurance.  He was discharged home with a glucose meter and test strips.  Patient was discharged home with plan for PCP follow-up.    CKD4: Cr 3.25 on admission, consistent with recent baseline. Downtrended to 2.85 by day of discharge. U/S liver transplant 3/6 notable for atrophic and echogenic kidneys, suggestive of medial renal disease.      The patient's hospital stay has been complicated by the following clinically significant conditions requiring additional evaluation and treatment or having a significant effect of this patient's care: - Chronic kidney disease POA requiring further investigation, treatment, or monitoring               Outpatient Provider Follow Up Issues:   [ ]  BP check, adjust hypertension regimen & lasix  dosing, consider adding BB  [ ]  T2DM: will likely require uptitration of insulin  reg  [ ]  BMP, Mag, Phos    Touchbase with Outpatient Provider:  Warm Handoff: Not completed secondary to pt establishing care with PCP    Procedures:  None  ______________________________________________________________________  Discharge Medications:     Your Medication List      STOP taking these medications  insulin  detemir U-100 100 unit/mL (3 mL) injection pen  Commonly known as: LEVEMIR      insulin  lispro 100 unit/mL Inph     NOVOLOG  FLEXPEN SUBQ        START taking these medications    atorvastatin  40 MG tablet  Commonly known as: LIPITOR   Take 1 tablet (40 mg total) by mouth nightly.     BAQSIMI  3 mg/actuation Spry  Generic drug: glucagon   Use 1 spray into the left nostril once as needed for up to 1 dose.     CONTOUR NEXT EZ METER Misc  Generic drug: blood-glucose meter  USE AS DIRECTED     CONTOUR NEXT TEST STRIPS Strp  Generic drug: blood sugar diagnostic  Use to check blood sugar as directed with insulin  4 times a day & for symptoms of high or low blood sugar.     DEXCOM G6 SENSOR Devi  Generic drug: blood-glucose sensor  Use as directed to monitor blood glucose     DEXCOM G6 TRANSMITTER Devi  Generic drug: blood-glucose transmitter  Use as directed to monitor blood glucose     FIASP  FLEXTOUCH U-100 INSULIN  100 unit/mL (3 mL) Inpn  Generic drug: insulin  aspart (niacinamide)  Inject 4 Units under the skin Three (3) times a day. Take additional insulin  according to sliding scale, 4 times per day, with meals and at bedtime:  BG 151-200 = 1 units, BG 201-250 = 2 units, BG 251-300 = 3 units, BG >300 = 4 units     hydrOXYzine  25 MG tablet  Commonly known as: ATARAX   Take 1 tablet (25 mg total) by mouth every six (6) hours as needed for itching or anxiety.     MICROLET LANCET Misc  Generic drug: lancets  Use to check blood sugar as directed with insulin  4 times a day & for symptoms of high or low blood sugar.     nitroglycerin  0.4 MG SL tablet  Commonly known as: NITROSTAT   Place 1 tablet (0.4 mg total) under the tongue every five (5) minutes as needed for chest pain. Maximum of 3 doses in 15 minutes.     polyethylene glycol 17 gram/dose powder  Commonly known as: GLYCOLAX   Take 17 g by mouth Two (2) times a day.     TRESIBA  FLEXTOUCH U-100 100 unit/mL (3 mL) Inpn  Generic drug: insulin  degludec  Inject 0.12 mL (12 Units total) under the skin daily.     TRUEPLUS PEN NEEDLE 32 gauge x 5/32 (4 mm) Ndle  Generic drug: pen needle, diabetic  Use with insulin  up to 4 times a day as needed.        CHANGE how you take these medications    furosemide  20 MG tablet  Commonly known as: LASIX   Take 1 tablet (20 mg total) by mouth daily before breakfast.  What changed:   ?? medication strength  ?? how much to take  ?? how to take this     hydrALAZINE  25 MG tablet  Commonly known as: APRESOLINE   Take 1 tablet (25 mg total) by mouth four (4) times a day.  What changed:   ?? how to take this  ?? when to take this     isosorbide  mononitrate 30 MG 24 hr tablet  Commonly known as: IMDUR   Take 2 tablets (60 mg total) by mouth daily.  What changed:   ?? how much to take  ?? how to take this  ?? when to take this  CONTINUE taking these medications    amLODIPine  10 MG tablet  Commonly known as: NORVASC   Take 10 mg by mouth daily.     aspirin  81 MG tablet  Commonly known as: ECOTRIN  Take 81 mg by mouth daily.     magnesium  oxide 400 mg (241.3 mg elemental) tablet  Commonly known as: MAG-OX  Take 400 mg by mouth.     multivitamin per tablet  Commonly known as: TAB-A-VITE/THERAGRAN  Take 1 tablet by mouth daily.     mycophenolate  250 mg capsule  Commonly known as: CELLCEPT   Take 1 capsule (250 mg total) by mouth Two (2) times a day.     PROGRAF  0.5 MG capsule  Generic drug: tacrolimus   Take 1 capsule (0.5 mg total) by mouth two (2) times a day.     VITAMIN D3 ORAL  Take 2,000 Units by mouth daily.            Allergies:  Insulin  asp prt-insulin  aspart and Insulin  aspart  ______________________________________________________________________  Pending Test Results:  Pending Labs     Order Current Status    Blood Culture #1 Preliminary result    Blood Culture #2 Preliminary result          Most Recent Labs:  All lab results last 24 hours -   Recent Results (from the past 24 hour(s))   POCT Glucose    Collection Time: 08/31/21  9:00 PM   Result Value Ref Range    Glucose, POC 362 (H) 70 - 179 mg/dL   POCT Glucose    Collection Time: 09/01/21  1:09 AM   Result Value Ref Range    Glucose, POC 289 (H) 70 - 179 mg/dL   POCT Glucose    Collection Time: 09/01/21  5:20 AM   Result Value Ref Range    Glucose, POC 236 (H) 70 - 179 mg/dL   POCT Glucose    Collection Time: 09/01/21  8:05 AM   Result Value Ref Range    Glucose, POC 216 (H) 70 - 179 mg/dL   Basic Metabolic Panel    Collection Time: 09/01/21 10:19 AM   Result Value Ref Range    Sodium 141 135 - 145 mmol/L    Potassium 3.2 (L) 3.4 - 4.8 mmol/L    Chloride 105 98 - 107 mmol/L    CO2 27.0 20.0 - 31.0 mmol/L    Anion Gap 9 5 - 14 mmol/L    BUN 35 (H) 9 - 23 mg/dL    Creatinine 7.14 (H) 0.60 - 1.10 mg/dL    BUN/Creatinine Ratio 12     eGFR CKD-EPI (2021) Male 26 (L) >=60 mL/min/1.38m2    Glucose 200 (H) 70 - 179 mg/dL    Calcium  9.1 8.7 - 10.4 mg/dL   CBC    Collection Time: 09/01/21 10:19 AM   Result Value Ref Range    WBC 6.9 3.6 - 11.2 10*9/L    RBC 4.41 4.26 - 5.60 10*12/L    HGB 12.2 (L) 12.9 - 16.5 g/dL    HCT 63.3 (L) 60.9 - 48.0 %    MCV 82.9 77.6 - 95.7 fL    MCH 27.6 25.9 - 32.4 pg    MCHC 33.2 32.0 - 36.0 g/dL    RDW 86.8 87.7 - 84.7 %    MPV 7.5 6.8 - 10.7 fL    Platelet 207 150 - 450 10*9/L   Hepatic Function Panel    Collection Time: 09/01/21 10:19 AM  Result Value Ref Range    Albumin 3.2 (L) 3.4 - 5.0 g/dL    Total Protein 6.5 5.7 - 8.2 g/dL    Total Bilirubin 0.9 0.3 - 1.2 mg/dL    Bilirubin, Direct 9.69 0.00 - 0.30 mg/dL    AST 37 (H) <=65 U/L    ALT 74 (H) 10 - 49 U/L    Alkaline Phosphatase 119 (H) 46 - 116 U/L   Tacrolimus  Level, Trough    Collection Time: 09/01/21 10:19 AM   Result Value Ref Range    Tacrolimus , Trough 4.4 (L) 5.0 - 15.0 ng/mL   POCT Glucose    Collection Time: 09/01/21 11:28 AM   Result Value Ref Range    Glucose, POC 191 (H) 70 - 179 mg/dL       Relevant Studies/Radiology:  ECG 12 Lead    Result Date: 08/30/2021  SINUS RHYTHM WITH 1ST DEGREE AV BLOCK RIGHT BUNDLE BRANCH BLOCK LEFT ANTERIOR FASCICULAR BLOCK BIFASCICULAR BLOCK ABNORMAL ECG WHEN COMPARED WITH ECG OF 28-Aug-2021 14:46, PREMATURE ATRIAL BEATS ARE NO LONGER PRESENT NONSPECIFIC T WAVE ABNORMALITY NOW EVIDENT IN LATERAL LEADS Confirmed by Alix Planas ([id number]) on 08/30/2021 1:12:09 PM    ECG 12 Lead    Result Date: 08/28/2021  SINUS RHYTHM WITH 1ST DEGREE AV BLOCK WITH PREMATURE ATRIAL BEATS RIGHT BUNDLE BRANCH BLOCK LEFT ANTERIOR FASCICULAR BLOCK POOR R WAVE PROGRESSION IN PRECORDIAL LEADS WHEN COMPARED WITH ECG OF 10-Jul-2015 14:02, PREMATURE ATRIAL BEATS ARE NOW PRESENT PR INTERVAL HAS INCREASED RIGHT BUNDLE BRANCH BLOCK IS NOW PRESENT AXIS SHIFT TO LEFT Confirmed by Vinie Dunnings [phone]) on 08/28/2021 3:46:00 PM    CT abdomen pelvis without contrast    Result Date: 08/29/2021  EXAM: CT ABDOMEN PELVIS WO CONTRAST DATE: 08/29/2021 2:31 AM ACCESSION: [id number] UN DICTATED: 08/29/2021 3:03 AM INTERPRETATION LOCATION: [facility] CLINICAL INDICATION: 89 years [name] with Generalized abdominal pain, CKD w/ GFR < 30 ; Abdominal pain, acute, nonlocalized  COMPARISON: CT abdomen pelvis dated 07/09/2015 TECHNIQUE: A spiral CT scan was obtained without IV contrast from the lung bases to the pubic symphysis.  Images were reconstructed in the axial plane. Coronal and sagittal reformatted images were also provided for further evaluation. Evaluation of the solid organs and vasculature is limited in the absence of intravenous contrast. FINDINGS: LOWER CHEST: Scattered calcified granulomas are seen throughout the lingula and left lower lobe, which are not significantly changed from prior exam. Otherwise, the visualized portions of the lower chest are unremarkable. ABDOMEN/PELVIS: HEPATOBILIARY: Status-post liver transplantation. No obvious focal hepatic lesion is identified within limits of noncontrast exam. Gallbladder surgically absent. No biliary ductal dilatation. SPLEEN: Normal in size and contour. PANCREAS: Normal pancreatic contour without signs of inflammation or gross ductal dilatation. ADRENAL GLANDS: Normal appearance of the adrenal glands. KIDNEYS/URETERS: Smooth renal contours. No obvious solid renal mass lesion. Punctate 2 mm nonobstructing right renal calculi (3:80) No ureteral dilatation or collecting system distention. BLADDER: Mostly decompressed, limiting evaluation. REPRODUCTIVE ORGANS: Calcification of the central prostate noted. GI TRACT: No evidence of bowel thickening or obstruction. Nondilated air-filled appendix identified. Mild colonic stool burden. A few scattered colonic diverticuli, without evidence to suggest diverticulitis. PERITONEUM/RETROPERITONEUM AND MESENTERY: No intraperitoneal free air or fluid identified. No focal fluid collection or abscess VASCULATURE: Abdominal aorta is normal in caliber. Scattered calcifications of the abdominal aorta and its branch vessels. Otherwise, evaluation of the vascular structures is limited in absence of IV contrast. Similar-appearing 2.8 cm calcified splenic artery aneurysm, previously measuring 2.7 cm in diameter, however,  evaluation is limited in absence of IV contrast. Upper abdominal varices. LYMPH NODES: No lymphadenopathy by size criteria. BONES and SOFT TISSUES: Small fat-containing umbilical hernia. No acute osseous abnormality. No worrisome osseous lesion. Multilevel degenerative changes are noted throughout the spine.     *Several nonobstructing right-sided renal calculi. No evidence of hydronephrosis.Otherwise, no acute abnormality identified throughout the abdomen or pelvis to explain etiology of patient's abdominal pain. *Other chronic or incidental findings, as noted above.    XR Chest 1 view Portable    Result Date: 08/29/2021  EXAM: XR CHEST PORTABLE DATE: 08/29/2021 6:40 AM ACCESSION: [id number] UN DICTATED: 08/29/2021 6:45 AM INTERPRETATION LOCATION: [facility] CLINICAL INDICATION: 56 years [name] Male with SHORTNESS OF BREATH  TECHNIQUE: Single View AP Chest Radiograph. COMPARISON: Chest radiograph dated 08/28/2021 FINDINGS: Lungs are clear. No pleural effusion. No pneumothorax. Normal cardiac silhouette.     No acute abnormalities.    XR Chest 2 views    Result Date: 08/28/2021  EXAM: XR CHEST 2 VIEWS DATE: 08/28/2021 3:16 PM ACCESSION: [id number] UN DICTATED: 08/28/2021 4:01 PM INTERPRETATION LOCATION: [facility] CLINICAL INDICATION: 56 years [name] Male with DYSPNEA  TECHNIQUE: PA and Lateral Chest Radiographs. COMPARISON: Chest radiograph 05/04/2020 FINDINGS: Stable scattered subcentimeter hyperdense nodular opacity consistent with likely calcified granuloma. Lungs are clear. No pleural effusion. No pneumothorax. Normal cardiomediastinal silhouette.     No acute abnormality.    US  Liver Transplant    Result Date: 08/28/2021  EXAM: US  LIVER TRANSPLANT DATE: 08/28/2021 ACCESSION: [id number] UN DICTATED: 08/28/2021 4:38 PM INTERPRETATION LOCATION: [facility] CLINICAL INDICATION: 56 years [name] Male with RUQ pain, hx liver transplant  COMPARISON: Liver transplant ultrasound 05/04/2020 TECHNIQUE: Ultrasound views of the complete abdomen were obtained using gray scale and color and spectral Doppler imaging. FINDINGS: Visualization is mildly limited due to patient body habitus and overlying bowel gas; within this limitation, the following findings are made: HEPATOBILIARY: The liver is mildly heterogenous in echogenicity, similar to prior. No focal hepatic lesions. No intrahepatic biliary ductal dilatation. The common bile duct is normal in caliber. The gallbladder is surgically absent. PANCREAS: Not well visualized due to overlying bowel gas and body habitus. SPLEEN: Normal in size and echotexture. KIDNEYS: Bilateral atrophic and echogenic kidneys. No hydronephrosis. VESSELS: - Portal vein: The main, left and right portal veins are patent with hepatopetal flow. Normal main portal vein velocity (0.20 m/s or greater) - Splenic vein: Patent, with hepatopetal flow. Redemonstrated 2.4 cm outpouching along the splenic hilar vasculature may represent splenic artery aneurysm is stable dating back to 2014. - Hepatic veins/IVC: The IVC, left, middle and right hepatic veins are patent with triphasic waveforms. - Hepatic artery: Patent with resistive indices within normal limits. - Visualized proximal aorta:  unremarkable OTHER: No ascites. Please see below for data measurements: Liver: 14.7 cm Common bile duct: 0.64 cm Right kidney: 9.4 cm Left kidney: 10 cm Main portal vein diameter: 0.77 cm Main portal vein pre anastomosis velocity: 0.35 m/s Main portal vein anastomosis velocity: 0.32 m/s Main portal vein post anastomosis velocity: 0.26 m/s Anterior right portal vein velocity: 0.31 m/s Posterior right portal vein velocity: 0.18 m/s Left portal vein velocity: 0.12 m/s Right portal vein flow: hepatopetal Left portal vein flow: hepatopetal Common hepatic artery resistive index: 0.70 and systolic acceleration time 42. msec Right hepatic artery resistive index: 0.75 and systolic acceleration time 33 msec Left hepatic artery resistive index: 0.75 and systolic acceleration time 42 msec Left hepatic vein flow: triphasic Middle hepatic vein flow: triphasic Right hepatic vein flow:  triphasic Inferior vena cava flow: triphasic Splenic vein midline: hepatopetal Splenic vein proximal: hepatopetal Aorta: not well visualized Inferior vena cava: Partially visualized Spleen: 12.2 cm Abdominal free fluid visualized: no     Evaluation is limited due to patient body habitus and overlying bowel gas. Within this limitation, the following conclusions are made: 1. Sequelae of liver transplant. Numerous right upper quadrant collateral vessels extending to the splenic hilum are similar in appearance dating back to 2014. 2. Patent hepatic hepatic transplant vasculature with normal flow direction. 3. Atrophic and echogenic kidneys, suggestive of medial renal disease. ++++++++++++++++++++ MODIFIED REPORT: (08/28/2021 5:30 PM) This report has been modified from its preliminary version; you may check the prior versions of radiology report, results history link for prior report versions. -----------------------------------------------     Echocardiogram W Colorflow Spectral Doppler With Contrast    Result Date: 08/30/2021  Patient Info Name:     JAECEON MICHELIN Age:     55 years DOB:     08/05/1965 Gender:     Male MRN:     [id number] Accession #:     [id number] Ht:     178 cm Wt:     139 kg BSA:     2.69 m2 BP:     154 /     79 mmHg Exam Date:     08/30/2021 1:27 PM Site Location:     [facility] Exam Location:     [facility] Admit Date:     08/28/2021 Exam Type:     ECHOCARDIOGRAM W COLORFLOW SPECTRAL DOPPLER W CONTRAST Study Info Indications     - - p/w CP,  SOB,  elevated LFTs,  hypertesive emergency- eval for HF, WMA's Staff Referring Physician:     [id number], Kendy Haston; Reading Fellow:     Everitt Clint Amon Prentice MD Sonographer:     Montie Furnish Account #:     [id number] Summary   1. Technically difficult study due to chest wall/lung interference.   2. An ultrasound enhancing agent was used to improve the visualization of the left ventricular cavity and endocardial borders.   3. Concentric left ventricular hypertrophy with overall normal systolic function (EF 55-60%), with grade 2 diastolic dysfunction and segmental hypokinesis of the mid-inferior wall.   4. The right ventricle is mildly to moderately dilated in size, with normal systolic function.   5. There are no significant structural valvular abnormalities.   6. Mild to moderate biatrial enlargement. Left Ventricle   The left ventricle is normal in size with moderately increased wall thickness.   The left ventricular systolic function is normal, LVEF is visually estimated at 55-60%.   There is grade II diastolic dysfunction (elevated filling pressure).   The mid-inferior wall is relatively hypokinetic. Right Ventricle   The right ventricle is mildly to moderately dilated in size, with normal systolic function. Left Atrium   The left atrium is moderately dilated in size. Right Atrium   The right atrium is mildly dilated  in size. Aortic Valve   The aortic valve is trileaflet with normal appearing leaflets with normal excursion.   There is no significant aortic regurgitation.   There is no evidence of a significant transvalvular gradient. Pulmonic Valve   The pulmonic valve is poorly visualized, but probably normal.   There is no significant pulmonic regurgitation.   There is no evidence of a significant transvalvular gradient. Mitral Valve   The mitral valve leaflets are normal with probably normal leaflet mobility.   There is trivial mitral  valve regurgitation. Tricuspid Valve   The tricuspid valve leaflets are normal, with normal leaflet mobility.   There is no significant tricuspid regurgitation.   The pulmonary systolic pressure cannot be estimated due to insufficient TR signal. Pericardium/Pleural   There is no pericardial effusion. Inferior Vena Cava   IVC size and inspiratory change suggest normal right atrial pressure. (0-5 mmHg). Aorta   The aorta is upper normal in size in the visualized segments. Pulmonic Valve ---------------------------------------------------------------------- Name                                 Value        Normal ---------------------------------------------------------------------- PV Doppler ---------------------------------------------------------------------- PV Peak Velocity                   1.8 m/s Mitral Valve ---------------------------------------------------------------------- Name                                 Value        Normal ---------------------------------------------------------------------- MV Diastolic Function ---------------------------------------------------------------------- MV E Peak Velocity                105 cm/s               MV A Peak Velocity                 87 cm/s               MV E/A                                 1.2               MV Annular TDI ---------------------------------------------------------------------- MV Septal e' Velocity             5.0 cm/s         >=8.0 MV Lateral e' Velocity           10.1 cm/s        >=10.0 MV e' Average                          7.5               MV E/e' (Average)                     15.8 Tricuspid Valve ---------------------------------------------------------------------- Name                                 Value        Normal ---------------------------------------------------------------------- Estimated PAP/RSVP ---------------------------------------------------------------------- RA Pressure                         3 mmHg           <=5 Aorta ---------------------------------------------------------------------- Name                                 Value        Normal ---------------------------------------------------------------------- Ascending Aorta ---------------------------------------------------------------------- Ao Root Diameter (2D)               4.3 cm  Ao Root Diam Index (2D)         16.0 cm/m2               Asc Ao Diameter                     3.6 cm Venous ---------------------------------------------------------------------- Name                                 Value        Normal ---------------------------------------------------------------------- IVC/SVC ---------------------------------------------------------------------- IVC Diameter (Exp 2D)               2.1 cm         <=2.1 Aortic Valve ---------------------------------------------------------------------- Name                                 Value        Normal ---------------------------------------------------------------------- AV Doppler ---------------------------------------------------------------------- AV Peak Velocity                   1.9 m/s Ventricles ---------------------------------------------------------------------- Name                                 Value        Normal ---------------------------------------------------------------------- LV Dimensions 2D/MM ---------------------------------------------------------------------- IVS Diastolic Thickness (2D)        1.8 cm       0.6-1.0 LVID Diastole (2D)                  4.6 cm       4.2-5.8 LVPW Diastolic Thickness (2D)                                1.6 cm       0.6-1.0 LVID Systole (2D)                   3.0 cm       2.5-4.0 RV Dimensions 2D/MM ---------------------------------------------------------------------- RV Basal Diastolic Dimension        4.9 cm       2.5-4.1 TAPSE                               1.9 cm         >=1.7 Atria ---------------------------------------------------------------------- Name                                 Value        Normal ---------------------------------------------------------------------- LA Dimensions ---------------------------------------------------------------------- LA Dimension (2D)                   5.1 cm       3.0-4.1 LA Volume Index (4C A-L)       50.58 ml/m2               LA Volume Index (2C A-L)       51.67 ml/m2               LA Volume (BP MOD)                  144 ml  LA Volume Index (BP MOD)       53.62 ml/m2   16.00-34.00 RA Dimensions ---------------------------------------------------------------------- RA Area (4C)                      23.5 cm2        <=18.0 RA Area (4C) Index              8.8 cm2/m2 Report Signatures Finalized by Carlin Irving  MD on 08/30/2021 04:25 PM Resident Prentice JONELLE Everitt Clint Amon  MD on 08/30/2021 02:14 PM    XR Abdomen 1 View    Result Date: 08/31/2021  EXAM: XR ABDOMEN 1 VIEW DATE: 08/31/2021 12:30 PM ACCESSION: [id number] UN DICTATED: 08/31/2021 1:07 PM INTERPRETATION LOCATION: [facility] CLINICAL INDICATION: 56 years [name] Male with CALCULUS OF KIDNEY  COMPARISON: Abdominal radiograph dated 08/30/2021 TECHNIQUE: Supine view of the abdomen. FINDINGS: Nonobstructive bowel gas pattern. Moderate colonic stool burden. No definite calcifications overlying the renal shadows, although evaluation is limited due to overlying bowel gas and stool. Right pelvic phlebolith. No acute osseous abnormalities. Limited evaluation of the lung bases due to overpenetration.     No radiographic evidence of nephrolithiasis.    XR Abdomen 1 View    Result Date: 08/30/2021   EXAM: XR ABDOMEN 1 VIEW DATE: 08/30/2021 10:41 AM ACCESSION: [id number] UN DICTATED: 08/30/2021 10:42 AM INTERPRETATION LOCATION: [facility] CLINICAL INDICATION: 56 years [name] Male with ABDOMINAL PAIN  -  UNSPECIFIED SITE  COMPARISON: Previous day CT abdomen pelvis TECHNIQUE: Portable supine views of the abdomen. FINDINGS: Unremarkable lung bases. Nonobstructive bowel gas pattern. Nondilated loops of air-filled small bowel project over the lower midabdomen and pelvis. Mild colonic stool burden. Mild multilevel degenerative changes are seen throughout spine. Soft tissues are unremarkable.     Nonobstructive bowel gas pattern. Mild colonic stool burden.    ED POCUS [place] Protocol Emergency    Result Date: 08/31/2021  Limited Cardiac Ultrasound (CPT [phone]) Indication: A focused ultrasound exam of the heart was performed to evaluate for pericardial effusion, tamponade, severe hypovolemia, or gross abnormalities of cardiac anatomy or function in this patient. The ultrasound was performed with the following indications, as noted in the H&P: Chest pain and Dyspnea Identified structures: The pericardial sac, myocardium, and 4 chambers were identified using the following views: subxiphoid, parasternal long axis, parasternal short axis, and apical 4-chamber Findings: Exam of the above structures revealed the following findings:  Pericardial effusion: Absent   Pericardial tamponade: N/A  Global LV function: Normal  Right ventricular size: Normal Other findings: Thickened LV wall Limitations: Body habitus Impression: Thickened left ventricular wall without sonographic evidence of significant cardiac dysfunction Interpreted by: Donnajean Costa, MD Quality Assurance  After review of the point-of-care ultrasound performed in this case I assess the overall image quality as: Image quality: Minimal criteria met for diagnosis, recognizable structures but with some technical or other flaws  The accuracy of interpretation of images as presented reflects a true positive. +LVH, gross ctx looks ok This study does  meet minimum criteria for credentialing and billing. Toribio JINNY Desanctis, MD     ______________________________________________________________________  Discharge Instructions:                Follow Up instructions and Outpatient Referrals Ambulatory referral to Cardiology      Ambulatory referral to [facility]      Ambulatory referral to Transplant Hepatology      Ambulatory referral to Urology      Discharge instructions  Discharge instructions      Discharge instructions          Appointments which have been scheduled for you    Sep 16, 2021  8:30 AM  (Arrive by 8:00 AM)  RETURN  HEPATOLOGY with Verneita Nichole Gale, ANP  [facility] L[facility] TRANSPLANT [place] [facility]) [address]  [place] [address]  [phone]      Sep 19, 2021  8:30 AM  (Arrive by 8:15 AM)  NEW  RESIDENT with Charlie JAYSON Primrose, MD  [facility] INTERNAL MEDICINE [place] [facility]) [address]  [place] [address]  [phone]      Oct 14, 2021  9:00 AM  (Arrive by 8:45 AM)  NEW  GENERAL with Artist Dannielle Payment, MD  [facility] [place] [facility]) [address]  [place] [address]  [phone]           ______________________________________________________________________  Discharge Day Services:  BP 130/81  - Pulse 86  - Temp 36.5 ??C (97.7 ??F) (Oral)  - Resp 19  - Wt (!) 113.6 kg (250 lb 6.4 oz)  - SpO2 98%  - BMI 35.93 kg/m??     Pt seen on the day of discharge and determined appropriate for discharge.    Condition at Discharge: good    Length of Discharge: I spent greater than 30 mins in the discharge of this patient.

## 2021-09-01 NOTE — Unmapped (Signed)
Diabetes education consultation: Consulted for assistance with providing instruction on diabetes self-management skills. F/u Visit with Richard [name] at the bedside to review insulin regimen at discharge. Provided 15 minutes of  diabetes education.     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  Richard [name] has Diabetes Mellitus Type 2- Insuling requiring  see MAR for medication list. Richard [name] reports home regimen: levemir nightly and novolog SSI AC. Reports no allergies to insulin aspart.  Richard [name] reports that he is adhering to insulin regimen and monitors his glucose via Dexcom CGM. However, his  Insurance was recently changed, and he has not been monitoring his glucose for over two weeks due to needing a refill for Dexcom. Richard [name] reports that he currently does not have a PCP and needs prior authorization for a refill. Richard [name] reports that he has made healthy lifestyle changes to self-manage his diabetes (being active and healthy eating) which has resulted in est. 50 lbs of weight loss.   ??  A1C:    Lab Results   Component Value Date    A1C 8.3 (H) 08/28/2021       Insulin administration & safety:Richard [name] is currently using insulin pens and reports no barriers to insulin administration. Discussed discharge insulin plan regimen per discharge orders:  insulin tresiba 12u nightly (every night)  --insulin fiasp 4u three times a day before meals  -- insulin fiasp, sliding scale 4 times per day, with meals and at bedtime:  ??BG 151-200 = 1 units,   BG 201-250 = 2 units,   BG 251-300 = 3 units,    BG >300 = 4 units      Explaining long-acting vs rapid acting action, timing of administration & described correctional sliding scale & connection to glucose monitoring. Richard [name] demonstrated an understanding of his insulin plan via teach-back method.     Instructed on insulin safety - including injection site selection & rotation, insulin pen storage, sharps disposal & discard/expiration date.      Problem-solving:   Hypoglycemia:    Instructed on hypoglycemia recognition & treatment, including 15-15 rule, listing several examples of appropriate fast carb sources, emphasizing importance of having something readily available. Described possible causes and prevention.  Instructed to contact provider for unexplained episode, an episode requiring more than 1 treatment, or 2 episodes within a 2-3 day period, for possible dose adjustment.    Provided [employer] Handout to share with caregiver.   Provided printed material: Hypoglycemia in Diabetes as a resource.  Hyperglycemia:  Instructed on hyperglycemia recognition and treatment. Described possible causes and prevention and when to contact provider.   Provided printed material: Hyperglycemia  And as resources      Monitoring: Provided Richard [name] with an Contour Next meter kit and instructed on use. Richard [name] was confident in ability to perform glucose check and declined hands-on instructions on how to use. Unable to provide Dexcom G6 due to needed prior auth and per care team will need to  f/u with outpatient provider. Richard [name] agreed to monitor his blood sugar 4 times/day prior to insulin administration and when symptomatic with fingerstick readings until he establishes a PCP.      Provided with copies of large print log sheets to maintain a record of readings & instructed to  bring to follow-up visits with provider for evaluation of treatment plan.    Recommendations:   - Assist with establishing PCP/endo for f/u care  At f/u visit with PCP request referral for DSMES (Diabetes Self Management Education and Support) after discharge.     Plan: No further teaching needed. Patient expected to discharge.    Thank You,   Thomas Hoff, MSN, RN, Diabetes Nurse Educator- pager: [phone]

## 2021-09-01 NOTE — Unmapped (Signed)
Patient discharged this shift. IV removed. All discharge paperwork reviewed and all questions answered.  Problem: Adult Inpatient Plan of Care  Goal: Plan of Care Review  Outcome: Resolved  Goal: Patient-Specific Goal (Individualized)  Outcome: Resolved  Goal: Absence of Hospital-Acquired Illness or Injury  Outcome: Resolved  Intervention: Identify and Manage Fall Risk  Recent Flowsheet Documentation  Taken 09/01/2021 0800 by Westley Chandler, RN  Safety Interventions: environmental modification  Intervention: Prevent Skin Injury  Recent Flowsheet Documentation  Taken 09/01/2021 0800 by Westley Chandler, RN  Skin Protection: adhesive use limited  Intervention: Prevent and Manage VTE (Venous Thromboembolism) Risk  Recent Flowsheet Documentation  Taken 09/01/2021 0800 by Westley Chandler, RN  VTE Prevention/Management: ambulation promoted  Goal: Optimal Comfort and Wellbeing  Outcome: Resolved  Goal: Readiness for Transition of Care  Outcome: Resolved  Goal: Rounds/Family Conference  Outcome: Resolved     Problem: Diabetes Comorbidity  Goal: Blood Glucose Level Within Targeted Range  Outcome: Resolved

## 2021-09-01 NOTE — Unmapped (Signed)
Tacrolimus Therapeutic Monitoring Pharmacy Note    Richard [name] is a 56 y.o. male continuing tacrolimus.     Indication: Liver transplant     Date of Transplant: 2013      Prior Dosing Information: Home regimen 0.5 mg BID     Goals:  Therapeutic Drug Levels  Tacrolimus trough goal: 2-4 ng/mL    Additional Clinical Monitoring/Outcomes  ?? Monitor renal function (SCr and urine output) and liver function (LFTs)  ?? Monitor for signs/symptoms of adverse events (e.g., hyperglycemia, hyperkalemia, hypomagnesemia, hypertension, headache, tremor)    Results:   Tacrolimus level: 4.4 ng/mL, drawn early, 1 hour after dose administration    Pharmacokinetic Considerations and Significant Drug Interactions:  ??? Concurrent hepatotoxic medications: None identified  ??? Concurrent CYP3A4 substrates/inhibitors: atorvastatin  ??? Concurrent nephrotoxic medications: none    Assessment/Plan:  Recommendedation(s)  ??? Continue current regimen of 0.5 mg BID    Follow-up  ??? Next level to be followed-up outpatient, plan to discharge patient today.   ??? A pharmacist will continue to monitor and recommend levels as appropriate    Please page service pharmacist with questions/clarifications.    Philip Aspen, PharmD Candidate

## 2021-09-01 NOTE — Unmapped (Signed)
A&Ox4.  VSS.  No c/o pain overnight. Voiding without issues.  Ambulatory.  Will discharge today.  RN will continue to monitor.    Problem: Adult Inpatient Plan of Care  Goal: Plan of Care Review  Outcome: Progressing  Goal: Patient-Specific Goal (Individualized)  Outcome: Progressing  Goal: Absence of Hospital-Acquired Illness or Injury  Outcome: Progressing  Intervention: Identify and Manage Fall Risk  Recent Flowsheet Documentation  Taken 09/01/2021 0200 by Sabino Snipes, RN  Safety Interventions:   fall reduction program maintained   environmental modification   lighting adjusted for tasks/safety   low bed   nonskid shoes/slippers when out of bed  Taken 09/01/2021 0000 by Sabino Snipes, RN  Safety Interventions:   environmental modification   fall reduction program maintained   lighting adjusted for tasks/safety   low bed   nonskid shoes/slippers when out of bed  Taken 08/31/2021 2200 by Sabino Snipes, RN  Safety Interventions: environmental modification  Taken 08/31/2021 2000 by Sabino Snipes, RN  Safety Interventions:   environmental modification   fall reduction program maintained   lighting adjusted for tasks/safety   low bed   nonskid shoes/slippers when out of bed  Intervention: Prevent Skin Injury  Recent Flowsheet Documentation  Taken 08/31/2021 2000 by Sabino Snipes, RN  Skin Protection: adhesive use limited  Intervention: Prevent and Manage VTE (Venous Thromboembolism) Risk  Recent Flowsheet Documentation  Taken 08/31/2021 2000 by Sabino Snipes, RN  Activity Management: activity adjusted per tolerance  Goal: Optimal Comfort and Wellbeing  Outcome: Progressing  Goal: Readiness for Transition of Care  Outcome: Progressing  Goal: Rounds/Family Conference  Outcome: Progressing     Problem: Diabetes Comorbidity  Goal: Blood Glucose Level Within Targeted Range  Outcome: Progressing     Problem: Hypertension Comorbidity  Goal: Blood Pressure in Desired Range  Outcome: Progressing     Problem: Pain Acute  Goal: Acceptable Pain Control and Functional Ability  Outcome: Progressing

## 2021-09-01 NOTE — Unmapped (Signed)
Internal Medicine (MEDU) Progress Note    Assessment & Plan:   Richard [name] is a 56 y.o. male with a PMHx of liver transplant (for NASH cirrhosis, on CellCept and tacrolimus), CKD 4, T2DM, HTN, who presented with intermittent, increasingly frequent severe abdominal pain  x2 weeks associated with chest tightness, and found to have elevated blood pressures, troponin, and AST/ALT.  Troponin elevation most likely type II MI 2/2 hypertension.  LFT changes also most likely related to hypertension, as they have down trended alongside improved blood pressure control.  Episodic, severe abdominal pain ureteral or bladder spasms (R renal calculus noted on CT); normal lactate and lack of association with eating lowers concern for mesenteric ischemia.  Per hepatology, LFT downtrend and liver ultrasound reassuring against transplant rejection.  CMV & Viral hepatitis panel negative.    Patient requires further inpatient monitoring due to elevated blood sugars and titration of insulin regimen.      Principal Problem:    Type 2 MI (myocardial infarction) (CMS-HCC)  Active Problems:    Hypertension    Liver replaced by transplant (CMS-HCC)    Morbid obesity with BMI of 45.0-49.9, adult (CMS-HCC)    Immunosuppression due to drug therapy (CMS-HCC)    CKD (chronic kidney disease) stage 4, GFR 15-29 ml/min (CMS-HCC)    Coronary artery disease    Abnormal transaminases  Resolved Problems:    * No resolved hospital problems. *    T2DM: A1c 8.3.  On basal & mealtime insulin at home. Home insulin held on admission given abdominal pain which may interfere with ability to take reliable p.o. in setting of abdominal pain. 3/7-3/8  Tolerating PO and BG 200-300's.  Need to further titrate insulin regimen and lack of blood glucose monitoring supplies, insurance coverage challenges (need prior Auth for Dexcom, Lantus and lispro not covered) complicating discharge.  - lantus 10u nightly  -Lispro 3 units TID with meals  - SSI  - diabetes education consult  - needs prior Auth for Dexcom    Abdominal & suprapubic pain: 3/8 no abd pain x24hr. most likely due to ureteral or bladder spasms secondary to nephrolithiasis, given small renal calculi on CT and UA with sterile pyuria. It is possible that patient passed a kidney stone.  Viral hepatitis panel reassuring.  CMV negative.  EBV positive, consistent with known history.  -Defer mesenteric  PVL, given low concern for mesenteric ischemia  - repeat KUB to eval for renal calculus  - miralax daily  - Pain mgmt:   - oxycodone 5-10mg  Q6H PRN pain   - dilaudid 1.5mg  IV Q3H PRN severe pain   - tylenol 1000mg  Q6H  PRN mild pain  -Consider urology follow-up outpatient    Hx liver transplant 2/2 NASH cirrhosis I Elevated AST/ALT: Mild LFT elevation on admission most likely related to hypertensive emergency.  3/7  AST/ALT downtrend in setting of improved blood pressure control reassuring.Transplant rejection unlikely (LFTs now downtrending, Tac level appropriate on admission, no history of rejection, reassuring ultrasound) but remains on differential as patient describes similar presentation leading up to liver transplant. Tylenol and EtOH levels normal, reassuring against toxic ingestion.  Viral hepatitis panel reassuring. EBV positive, consistent with known history.  - Hepatology signed off, appreciate recs  - Home mycophenolate mofetil 250 mg BID  - Home tacrolimus 0.5mg  BID  - atarax PRN itching  - Daily Tac trough  - daily CMP, INR    type 2 MI, Hypertensive emergency  Hx hypertension  CAD, HFpEF  (  EF 61% 07/2021)  Prior TTE 07/2021 showed LVEF 61%, and WMA's ( hypokinesis of the basal-mid anteroseptal, basal-mid inferoseptal, and basal-mid inferior myocardium).  This admission, troponin peak 213, plateaued ~200's; delayed downtrend most likely due to impaired renal clearance in setting of CKD 4.  TTE 08/30/2021 showed LVEF 5-60%, concentric LVH, grade 2 diastolic dysfunction, moderate RV dilatation, mild/moderate biatrial enlargement, and segmental hypokinesis of the mid inferior LV wall.  3/8 No evidence of volume overload on exam, no further episodes of chest tightness.   -Cardiology consulted, appreciate recs  - ASA 81 mg daily  - start atorvastatin  - holding home correg  - home amlodipine 10mg  daily  - hydralazine 25mg  QID  - nitroglycerin PRN chest pain  - Consider starting low-dose lasix pending clinical trajectory    CKD4: U/S liver transplant 3/6 notable for atrophic and echogenic kidneys, suggestive of medial renal disease.  -Daily BMP, Mag, Phos      Daily Checklist:  Diet: Regular Diet  DVT PPx: SCD's  Electrolytes: caution given CKD  Code Status: Full Code  Dispo: continue current level of care    Team Contact Information:   Primary Team: Internal Medicine (MEDU)  Primary Resident: Kathlee Nations, MD  Resident's Pager: [phone] (Gen MedU Intern - [name])    Interval History:   NAEON.  No abdominal pain x24 hours.  No chest pain, chest tightness, shortness of breath, nausea.    Objective:     Temp:  [36.6 ??C (97.9 ??F)-37.6 ??C (99.7 ??F)] 36.8 ??C (98.2 ??F)  Heart Rate:  [76-89] 83  Resp:  [16-18] 17  BP: (128-165)/(63-84) 128/63  SpO2:  [95 %-98 %] 98 %,     Intake/Output Summary (Last 24 hours) at 08/31/2021 1803  Last data filed at 08/31/2021 1324  Gross per 24 hour   Intake 240 ml   Output 2300 ml   Net -2060 ml   , I/O this shift:  In: 240 [P.O.:240]  Out: 750 [Urine:750], Body mass index is 35.93 kg/m??.,   Wt Readings from Last 3 Encounters:   08/30/21 (!) 113.6 kg (250 lb 6.4 oz)   05/04/20 (!) 138.8 kg (306 lb)   05/02/19 (!) 117.9 kg (260 lb)       Gen: Awake, alert, in NAD.  Nontoxic.  Lying comfortably in bed with HOB elevated.  HEENT: EOMI, PERRL, no scleral icterus, no sublingual jaundice.  Heart: RRR  Lungs: Normal work of breathing on room air, CTAB  Abdomen: NABS, mild tenderness to palpation in epigastric region, otherwise nontender, no guarding/rigidity/rebound  MSK/Ext: WWP, distal pulses intact in bilateral UE and LE  Skin: No jaundice or spider angiomas; small bruise at site of insulin injections on RLQ, otherwise no abdominal or flank ecchymoses  Neuro: Face symmetric, moves all extremities equally  Psych: Anxious affect      Labs/Studies: Labs and Studies from the last 24hrs per EMR and Reviewed

## 2021-09-15 DIAGNOSIS — N189 Chronic kidney disease, unspecified: Principal | ICD-10-CM

## 2021-09-15 DIAGNOSIS — Z944 Liver transplant status: Principal | ICD-10-CM

## 2021-09-15 DIAGNOSIS — Z5181 Encounter for therapeutic drug level monitoring: Principal | ICD-10-CM

## 2021-09-15 DIAGNOSIS — E612 Magnesium deficiency: Principal | ICD-10-CM

## 2021-09-16 ENCOUNTER — Ambulatory Visit: Admit: 2021-09-16 | Discharge: 2021-09-17 | Payer: PRIVATE HEALTH INSURANCE

## 2021-09-16 DIAGNOSIS — N189 Chronic kidney disease, unspecified: Principal | ICD-10-CM

## 2021-09-16 DIAGNOSIS — R748 Abnormal levels of other serum enzymes: Principal | ICD-10-CM

## 2021-09-16 DIAGNOSIS — D849 Immunodeficiency, unspecified: Principal | ICD-10-CM

## 2021-09-16 DIAGNOSIS — E612 Magnesium deficiency: Principal | ICD-10-CM

## 2021-09-16 DIAGNOSIS — Z944 Liver transplant status: Principal | ICD-10-CM

## 2021-09-16 DIAGNOSIS — Z5181 Encounter for therapeutic drug level monitoring: Principal | ICD-10-CM

## 2021-09-16 LAB — CBC W/ AUTO DIFF
BASOPHILS ABSOLUTE COUNT: 0.1 10*9/L (ref 0.0–0.1)
BASOPHILS RELATIVE PERCENT: 0.5 %
EOSINOPHILS ABSOLUTE COUNT: 0.3 10*9/L (ref 0.0–0.5)
EOSINOPHILS RELATIVE PERCENT: 2.8 %
HEMATOCRIT: 35.6 % — ABNORMAL LOW (ref 39.0–48.0)
HEMOGLOBIN: 12.4 g/dL — ABNORMAL LOW (ref 12.9–16.5)
LYMPHOCYTES ABSOLUTE COUNT: 1.5 10*9/L (ref 1.1–3.6)
LYMPHOCYTES RELATIVE PERCENT: 13.6 %
MEAN CORPUSCULAR HEMOGLOBIN CONC: 34.8 g/dL (ref 32.0–36.0)
MEAN CORPUSCULAR HEMOGLOBIN: 27.7 pg (ref 25.9–32.4)
MEAN CORPUSCULAR VOLUME: 79.5 fL (ref 77.6–95.7)
MEAN PLATELET VOLUME: 7.4 fL (ref 6.8–10.7)
MONOCYTES ABSOLUTE COUNT: 0.6 10*9/L (ref 0.3–0.8)
MONOCYTES RELATIVE PERCENT: 5.5 %
NEUTROPHILS ABSOLUTE COUNT: 8.4 10*9/L — ABNORMAL HIGH (ref 1.8–7.8)
NEUTROPHILS RELATIVE PERCENT: 77.6 %
PLATELET COUNT: 278 10*9/L (ref 150–450)
RED BLOOD CELL COUNT: 4.47 10*12/L (ref 4.26–5.60)
RED CELL DISTRIBUTION WIDTH: 12.8 % (ref 12.2–15.2)
WBC ADJUSTED: 10.8 10*9/L (ref 3.6–11.2)

## 2021-09-16 LAB — COMPREHENSIVE METABOLIC PANEL
ALBUMIN: 3.7 g/dL (ref 3.4–5.0)
ALKALINE PHOSPHATASE: 111 U/L (ref 46–116)
ALT (SGPT): 26 U/L (ref 10–49)
ANION GAP: 12 mmol/L (ref 5–14)
AST (SGOT): 33 U/L (ref <=34)
BILIRUBIN TOTAL: 1.1 mg/dL (ref 0.3–1.2)
BLOOD UREA NITROGEN: 40 mg/dL — ABNORMAL HIGH (ref 9–23)
BUN / CREAT RATIO: 11
CALCIUM: 9.5 mg/dL (ref 8.7–10.4)
CHLORIDE: 96 mmol/L — ABNORMAL LOW (ref 98–107)
CO2: 30 mmol/L (ref 20.0–31.0)
CREATININE: 3.77 mg/dL — ABNORMAL HIGH
EGFR CKD-EPI (2021) MALE: 18 mL/min/{1.73_m2} — ABNORMAL LOW (ref >=60)
GLUCOSE RANDOM: 174 mg/dL — ABNORMAL HIGH (ref 70–99)
POTASSIUM: 3 mmol/L — ABNORMAL LOW (ref 3.4–4.8)
PROTEIN TOTAL: 7.4 g/dL (ref 5.7–8.2)
SODIUM: 138 mmol/L (ref 135–145)

## 2021-09-16 LAB — GAMMA GT: GAMMA GLUTAMYL TRANSFERASE: 89 U/L — ABNORMAL HIGH

## 2021-09-16 LAB — MONOCLONAL GAMMOPATHY CHEMISTRIES
GAMMAGLOBULIN; IGA: 264.7 mg/dL (ref 70.0–400.0)
GAMMAGLOBULIN; IGG: 1568 mg/dL (ref 646–2013)
GAMMAGLOBULIN; IGM: 120 mg/dL (ref 40–230)
PROTEIN TOTAL: 7.1 g/dL (ref 5.7–8.2)

## 2021-09-16 LAB — SERUM FREE LIGHT CHAINS
K/L FLC RATIO: 1.73 — ABNORMAL HIGH (ref 0.26–1.65)
KAPPA FREE,SERUM: 10.75 mg/dL — ABNORMAL HIGH (ref 0.33–1.94)
LAMBDA FREE, SER: 6.23 mg/dL — ABNORMAL HIGH (ref 0.57–2.63)

## 2021-09-16 LAB — ALBUMIN / CREATININE URINE RATIO
ALBUMIN QUANT URINE: 80.4 mg/dL
ALBUMIN/CREATININE RATIO: 1024.2 ug/mg — ABNORMAL HIGH (ref 0.0–30.0)
CREATININE, URINE: 78.5 mg/dL

## 2021-09-16 LAB — PHOSPHORUS: PHOSPHORUS: 4.1 mg/dL (ref 2.4–5.1)

## 2021-09-16 LAB — MAGNESIUM: MAGNESIUM: 1.8 mg/dL (ref 1.6–2.6)

## 2021-09-16 LAB — BILIRUBIN, DIRECT: BILIRUBIN DIRECT: 0.4 mg/dL — ABNORMAL HIGH (ref 0.00–0.30)

## 2021-09-16 LAB — TACROLIMUS LEVEL, TROUGH: TACROLIMUS, TROUGH: 2.6 ng/mL — ABNORMAL LOW (ref 5.0–15.0)

## 2021-09-16 NOTE — Unmapped (Addendum)
[facility]  [facility]  [address] [place]  [place], [place]  [address]  Ph: [phone]  Fax: [phone]     TRANSPLANT HEPATOLOGY FOLLOW UP CLINIC NOTE    PRIMARY CARE PROVIDER: Burgess Estelle, MD  Patient Care Team:  Burgess Estelle, MD as PCP - General (Internal Medicine)  Genia Harold, RN as Registered Nurse (Transplant)  Abby Vernie Shanks, MD as Attending Provider (Family Medicine)  Jordan Likes, CPP as Pharmacist (Pharmacist)  Linnell Fulling, ANP as Nurse Practitioner (Gastroenterology)  Rexene Agent, MD (Nephrology)    DATE OF SERVICE: 09/16/2021    ASSESSMENT AND PLAN:   Richard [name] is a 56 y.o. male who underwent liver transplantation on 03/02/2012 (Liver) for fatty liver.  No history of rejection or biliary issues. 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 He is doing well from a liver standpoint. As far as his intermittent abdominal discomfort I have advised him to take Miralax daily. Imaging in March reported a moderate stool burden.     Immunosuppression:  -tacrolimus 0.5 mg BID, last dose 930 pm, goal 2-4, level today pending    Stage IV CKD /HTN:  -followed by [facility] (Dr Rolanda Jay)      Preventative Health:   - Dermatology: This patient is at increased risk for the development of skin cancers due to immunosuppressant medications. Recommend yearly surveillance. Referral to Dermatology placed today.  -DEXA scan last done: 2013. Normal. Repeat Due now. Defer to PCP  - Colonoscopy: done 07/2018. Adenomatous, sessile serrated polyp removed. Recommended repeating in 5-10 years    Vaccinations: We recommend that patients have vaccinations to prevent various infections that can occur, especially in the setting of having underlying liver disease. The following vaccinations should be given:  -Hepatitis A: 2013  -Hepatitis B: needs series  -Influenza (yearly): due  -Pneumococcal: PPSV23 (05/2016, 03/2017) booster due 03/2022,and PCV13 (04/2011, 03/2016)  -Zoster: Shingrix  (03/27/2017) (03/2018)  -SARS-CoV-2: does not want vaccine    I spent a majority of my time today with the patient reviewing historical data, old records (including faxed data and data in Care Everywhere), performing a physical examination, and formulating an assessment and plan together with the patient.     Return in about 1 year (around 09/17/2022) for annual transplant appointment.    [name], NP  [facility]    Subjective    REASON FOR VISIT: Annual Transplant follow up  HISTORY OF PRESENT ILLNESS:      Richard [name] is a 56 y.o. male who underwent liver transplantation on 03/02/2012 (Liver) (9 years 6 months ago) for Cirrhosis: Fatty Liver (NASH). No history of rejection or biliary issues. 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     Interval Hx:   Patient was last seen on 05/04/2020. Since that time, he was admitted on 3/5-09/01/2021 with chest pain, abd pain, elevated troponins and acutely elevated liver enzymes in setting of severely elevated BP. Liver enzymes trended down as BP was under control. Abdominal pain was thought to be most consistent with ureteral or bladder spasms in setting of nephrolithiasis (given small R renal calculi noted on admission CT, and sterile pyuria on UA). Abdominal pain resolved after 48 hours. CT and abdominal xrays report a moderate stool burden. He says  he has a bowel movement everyday. He continues to have intermittent, infrequent lower abdominal pain lasting about 45 minutes, no fever or nausea associated with pain.     Objective    VITAL SIGNS: BP 139/81 (BP Site: L Arm, BP Position: Sitting, BP Cuff Size: Large)  - Pulse 76  - Temp 36.4 ??C (97.5 ??F) (Tympanic)  - Ht 177.8 cm (5' 10)  - Wt (!) 115.3 kg (254 lb 1.6 oz)  - SpO2 98%  - BMI 36.46 kg/m??   Body mass index is 36.46 kg/m??.  Wt Readings from Last 3 Encounters:   09/16/21 (!) 115.3 kg (254 lb 1.6 oz)   08/30/21 (!) 113.6 kg (250 lb 6.4 oz)   05/04/20 (!) 138.8 kg (306 lb)     PHYSICAL EXAM:   Constitutional: Alert, no acute distress, with good coloring  HENT: conjunctiva clear, anicteric, nares without discharge, neck supple  CV: Regular rate and rhythm  Lung: respirations even and unlabored  Abdomen: soft, non-distended, non-tender, central obestiy   Extremities: No edema, well perfused  Neuro: No focal deficits. No asterixis.   Mental Status: Thought organized, appropriate affect, engaged in conversation    DIAGNOSTIC STUDIES:  I have reviewed all pertinent diagnostic studies, including:  Laboratory results:  Lab Results   Component Value Date    WBC 10.8 09/16/2021    HGB 12.4 (L) 09/16/2021    MCV 79.5 09/16/2021    PLT 278 09/16/2021    Lab Results   Component Value Date    NA 138 09/16/2021    K 3.0 (L) 09/16/2021    CL 96 (L) 09/16/2021    CREATININE 3.77 (H) 09/16/2021    GLU 174 (H) 09/16/2021    CALCIUM 9.5 09/16/2021      Lab Results   Component Value Date    ALBUMIN 3.7 09/16/2021    AST 33 09/16/2021    ALT 26 09/16/2021    ALKPHOS 111 09/16/2021    BILITOT 1.1 09/16/2021    Lab Results   Component Value Date    INR 1.02 08/28/2021        Lab Results   Component Value Date    HAV POSITIVE 12/19/2011    HBSAG Nonreactive 08/29/2021    HEPBSAB Nonreactive 04/14/2015    HEPBCAB Nonreactive 04/14/2015    HEPCAB Nonreactive 08/29/2021    HIV Nonreactive 07/10/2015       Radiographic studies:   08/29/2021 CT abdomen/pelvis without contrast  GI TRACT: No evidence of bowel thickening or obstruction. Nondilated air-filled appendix identified. Mild colonic stool burden. A few scattered colonic diverticuli, without evidence to suggest diverticulitis.  VASCULATURE: Abdominal aorta is normal in caliber. Scattered calcifications of the abdominal aorta and its branch vessels. Otherwise, evaluation of the vascular structures is limited in absence of IV contrast. Similar-appearing 2.8 cm calcified splenic artery aneurysm, previously measuring 2.7 cm in diameter, however, evaluation is limited in absence of IV contrast. Upper abdominal varices.  Impression:  Several nonobstructing right-sided renal calculi. No evidence of hydronephrosis.Otherwise, no acute abnormality identified throughout the abdomen or pelvis to explain etiology of patient's abdominal pain.    Endoscopic studies:  08/13/2018 colonoscopy  Impression:  - The examined portion of the ileum was normal.                     - One 3 mm polyp in the cecum, removed with a cold biopsy  forceps. Resected and retrieved.                     - One 6 mm polyp in the ascending colon, removed with a                      cold snare. Resected and retrieved.                     - Two 2 mm polyps in the rectum, removed with a cold                      biopsy forceps. Resected and retrieved.                     - The examination was otherwise normal.

## 2021-09-16 NOTE — Unmapped (Signed)
[facility] Refill Coordination Note    Specialty Medication(s) to be Shipped:   Transplant: mycophenolate mofetil 250mg  and Prograf 0.5mg     Other medication(s) to be shipped: No additional medications requested for fill at this time     Richard [name], DOB: 02/14/1966  Phone: [phone] (home)       All above HIPAA information was verified with patient.     Was a Nurse, learning disability used for this call? No    Completed refill call assessment today to schedule patient's medication shipment from the [facility] [phone]).  All relevant notes have been reviewed.     Specialty medication(s) and dose(s) confirmed: Regimen is correct and unchanged.   Changes to medications: Willet reports no changes at this time.  Changes to insurance: No  New side effects reported not previously addressed with a pharmacist or physician: None reported  Questions for the pharmacist: No    Confirmed patient received a Conservation officer, historic buildings and a Surveyor, mining with first shipment. The patient will receive a drug information handout for each medication shipped and additional FDA Medication Guides as required.       DISEASE/MEDICATION-SPECIFIC INFORMATION        N/A    SPECIALTY MEDICATION ADHERENCE     Medication Adherence    Patient reported X missed doses in the last month: 0  Specialty Medication: Mycophenolate 250mg   Patient is on additional specialty medications: Yes  Additional Specialty Medications: Prograf 0.5mg   Patient Reported Additional Medication X Missed Doses in the Last Month: 0  Patient is on more than two specialty medications: No  Adherence tools used: patient uses a pill box to manage medications       Were doses missed due to medication being on hold? No    Mycophenolate 250 mg: 12 days of medicine on hand   Prograf 0.5 mg: 12 days of medicine on hand     REFERRAL TO PHARMACIST     Referral to the pharmacist: Not needed      [facility]     Shipping address confirmed in Epic. Delivery Scheduled: Yes, Expected medication delivery date: 09/23/2021.     Medication will be delivered via UPS to the prescription address in Epic WAM.    [name] [facility] Specialty Technician

## 2021-09-16 NOTE — Unmapped (Signed)
Patient seen in clinic today for his annual liver txp follow up. He reports he is feeling well overall, but states he continues to have occasional severe lower abdominal pain, which lasts about 45 min, causing vomiting or need to defecate. Per patient, these episodes seem to be precipitated by anxiety or physical exertion. He was very restless and fidgety during visit, which he attributes to anxiety, adding that a previous provdier had given him some medication in the past for this. He denies any issues with nausea,fever, chills, sob, dysuria or hernias. He has+3 LE edema today and notes he has nocturia. Per NP Harms, patient's recent imaging noted moderate stool burden, so she encouraged him to take a dose of miralax daily, which is on his med profile, citing patient may be having an issue with too much stool backing up. Patient completed labs today and reports he took his pm dose of tac at 9:30pm last night, taking all prescribed IS regularly. Reviewed med list/labs and spent about 15 minutes discussing post-liver txp health care. He has first appt on Monday, establishing with [facility] p[facility].He will see Dr.Zeitler next month for nephrology (contacted yesterday for additional lab orders.) Discussed the importance of glucose and bp control for kidney health and encouraged patient to hydrate with at least 80-100 oz of caffeine-free fluids daily. He denies any use of etoh, tobacco, herbal supplements or illicit drugs. Serum potassium at 3.0 today. Discussed the role of potassium for healthy cellular function and sx of hypo/hyperkalemia and provided a dietary list of K content of common foods. Discussed importance of infection prophy and remaining up to date on vaccines-still resistant today to covid vaccination. Ordered dermatology referral at [facility], since he is now covered for [facility] care. Colonoscopy done in 2020, with repeat recommended in 2025. Reinforced the importance of quarterly labs for liver transplant. He verbalized understanding of all discussed.

## 2021-09-16 NOTE — Unmapped (Signed)
Urine was collected and sent to the lab.

## 2021-09-16 NOTE — Unmapped (Signed)
Thank you for allowing me to participate in your medical care today. Here are my recommendations based on today's visit:    Start Miralax one capful daily. If stools become too loose then decrease to a half of cap.     [name], NP  [facility]  Division of Gastroenterology &Hepatology  O: [phone]  Fax: [phone]  Appointments  [phone]

## 2021-09-19 ENCOUNTER — Ambulatory Visit: Admit: 2021-09-19 | Discharge: 2021-09-20 | Payer: PRIVATE HEALTH INSURANCE

## 2021-09-19 DIAGNOSIS — Z944 Liver transplant status: Principal | ICD-10-CM

## 2021-09-19 DIAGNOSIS — E669 Obesity, unspecified: Principal | ICD-10-CM

## 2021-09-19 DIAGNOSIS — Z794 Long term (current) use of insulin: Principal | ICD-10-CM

## 2021-09-19 DIAGNOSIS — I10 Essential (primary) hypertension: Principal | ICD-10-CM

## 2021-09-19 DIAGNOSIS — E119 Type 2 diabetes mellitus without complications: Principal | ICD-10-CM

## 2021-09-19 DIAGNOSIS — Z0000 Encounter for general adult medical examination without abnormal findings: Principal | ICD-10-CM

## 2021-09-19 LAB — MONOCLONAL GAMMOPATHY WORKUP, SERUM
ALBUMIN (SPE): 4.1 g/dL (ref 3.5–5.0)
ALPHA-1 GLOBULIN: 0.3 g/dL (ref 0.2–0.5)
ALPHA-2 GLOBULIN: 0.8 g/dL (ref 0.5–1.1)
BETA-1 GLOBULIN: 0.4 g/dL (ref 0.3–0.6)
BETA-2 GLOBULIN: 0.3 g/dL (ref 0.2–0.6)
GAMMAGLOBULIN: 1.5 g/dL (ref 0.5–1.5)
PROTEIN TOTAL: 7.4 g/dL

## 2021-09-19 MED ORDER — DEXCOM G6 RECEIVER
Freq: Every day | 0 refills | 0.00000 days | Status: CP
Start: 2021-09-19 — End: 2021-09-19

## 2021-09-19 MED ORDER — FUROSEMIDE 20 MG TABLET
ORAL_TABLET | Freq: Every day | ORAL | 0 refills | 30 days | Status: CP
Start: 2021-09-19 — End: 2021-10-19

## 2021-09-19 MED ORDER — DEXCOM G6 TRANSMITTER DEVICE
4 refills | 0 days | Status: CP
Start: 2021-09-19 — End: 2021-12-18

## 2021-09-19 MED ORDER — DEXCOM G6 SENSOR DEVICE
Freq: Every day | 3 refills | 0 days | Status: CP
Start: 2021-09-19 — End: 2021-12-18

## 2021-09-19 MED ORDER — FIASP FLEXTOUCH U-100 INSULIN 100 UNIT/ML (3 ML) SUBCUTANEOUS PEN
Freq: Three times a day (TID) | SUBCUTANEOUS | 4 refills | 0 days | Status: CP
Start: 2021-09-19 — End: 2021-12-18

## 2021-09-19 MED ORDER — AMLODIPINE 5 MG TABLET
ORAL_TABLET | Freq: Every day | ORAL | 3 refills | 90 days | Status: CP
Start: 2021-09-19 — End: 2022-09-19

## 2021-09-19 NOTE — Unmapped (Signed)
Internal Medicine Initial Visit        Assessment/Plan:     Richard [name] presents today to establish care and for recent hospital follow-up, he was hospitalized for 3/5 to 3/9 for type II MI thought to be secondary to hypertension.  Past medical history of OLT, diabetes on insulin, hypertension, CKD.              1. Hypertension, unspecified type    2. Liver replaced by transplant (CMS-HCC)    3. Type 2 diabetes mellitus without complication, with long-term current use of insulin (CMS-HCC)    4. Obesity (BMI 30-39.9)        Diabetes mellitus, type 2   Last A1c 8.3%, 08/2021.  Doesn't check glucoses at home but then later reports checking before each meal and before bed. Last had a dex com 2 mo ago. Wasn't' using anything to check in between then and 3/5 hospitalization. Dexcom helped a lot before. Numbers checked before meals and before bed between 150 to 225. Has been using sliding scale to give correctional based off that. Taken off of oral meds like Metformin only in past because of CKD.  -Prescribed Dexcom: The patient has uncontrolled diabetes on 4 insulin injections per day  - continue aspart and degludec  -Continue degludec at same dose b/c gluc are 110-115 in AM  - taking aspart 4 units TIDAC, uses 2-4 units correctional 3-4 times per day, will incr mealtime insulin to 7 units TIDAC  - no episodes of hypoglycemia, knows to watch out for nausea, dizziness, feeling well, shaking    Hypertension   128/77 in clinic today. On amlodipine, hydralazine QID, might be taking Imdur and has recently filled prescription. no symptoms of low BP like Ortho hotn.  -Decrease amlodipine to 5 mg daily  -Increased daily Lasix from 20 to 40 mg  -Scheduled follow-up with enhanced care in 5 weeks  -Preferred not to start carvedilol before speaking with nephrologist, has upcoming appointment    CKD  Already has follow-up to establish at [facility] with Dr. Galen Manila with appointment and next couple months    Health maintenance  Trying to eat more natural foods. Right now exercising by walking every day 15-20. Will try 30 min per day 2-3x per week.  -Given follow-up on Mediterranean diet    Follow-up next visit:   CAD  No ASA he reports b/c of CKD. Not taking Atorvastatin. Would like to try lifestyle interventions first before starting a statin.     Toe infection, big toe left foot  Normal sensation in both feet on diabetic exam.  Not mentioned as a problem bothering him.  Can examine next visit.    Anxiety  Brought up having a lot of anxiety at the end of our visit and in the past taking lorazepam for that.  Not currently interested in counseling and held off on any prescriptions this visit.       Staffed with Dr. Shelva Majestic, seen and discussed    Return in about 5 weeks (around 10/24/2021) for Recheck.   And also in 2 months with me for anxiety/diabetes      Chief Complaint:      Richard [name] is a 56 y.o. male who presents to Establish Care for diabetes, hypertension      Subjective:     HPI  Richard [name] is a 56 y.o.??male??with a PMHx of liver transplant (for NASH cirrhosis, on CellCept and tacrolimus), CKD 4, T2DM, HTN, who presented  to [facility] 08/28/2021 with intermittent, increasingly frequent severe abdominal pain??x2 weeks associated with chest tightness, and was found to have elevated blood pressures, troponin, and AST/ALT.    See pertinent problem-based history.  Other history: Does have nocturia and urinary frequency more prevalent at night.      The past medical history, surgical history, family history, social history, medications and allergies were reviewed in Epic.     Review of Systems    The balance of 10 systems was reviewed and unremarkable except as stated above.        Objective:     BP 128/77  - Pulse 70  - Temp 36.8 ??C (98.3 ??F)  - Wt (!) 114.7 kg (252 lb 12.8 oz)  - SpO2 98%  - BMI 36.27 kg/m??      General: No acute distress, obese male  HEENT: No thyroid nodules or lymphadenopathy  Eyes: Anicteric  Cardiovascular: Normal rate and rhythm, distant heart sounds  Pulmonary: Clear to auscultation bilaterally  Gastrointestinal: No abdominal pain, healed scar from liver transplant  Musculoskeletal: Able to climb to exam table without help or issue  Skin: No rashes on clothed exam  Back: No CVA tenderness  Extremities: +2 pitting edema nearly to knees      Records Review completed    PHQ-9 Score:  PHQ-9 TOTAL SCORE: 0  GAD-7 Score:  GAD-7 Total Score: 3      Medication adherence and barriers to the treatment plan have been addressed. Opportunities to optimize healthy behaviors have been discussed. Patient / caregiver voiced understanding.

## 2021-09-19 NOTE — Unmapped (Addendum)
Will work on diet and exercise goals in note  Will adjust mealtime insulin to 7 units [facility]   Will receive dexcom by mail   Increase Lasix to 40 mg daily  Decrease amlodipine to 5 mg daily   Follow up for blood pressure check in 5 weeks

## 2021-09-19 NOTE — Unmapped (Signed)
[facility]     Reason for visit: est care     Questions / Concerns that need to be addressed: toe nail infection        Diabetes:  Regularly checking blood sugars?: yes  If yes, when? Complete log for past 7 days  Date Before Breakfast After Breakfast Before Lunch After Lunch Before Dinner After Dinner Before Bed                                                                                                                                     Hypertension:  Have blood pressure cuff at home?: no  Regularly checking blood pressure?: no  If patient has a  record of recent blood pressures, please enter into flowsheets using this link [id number]          Omron BPs (complete if screening BP has a systolic  > 129 or diastolic > 79)  BP#1 128/77  70           Allergies reviewed: Yes    Medication reviewed: Yes  Pended refills? No        HCDM reviewed and updated in Epic:    We are working to make sure all of our patients??? wishes are updated in Epic and part of that is documenting a Environmental health practitioner for each patient  A Health Care Decision Maker is someone you choose who can make health care decisions for you if you are not able - who would you most want to do this for you????  was updated.        BPAs completed:  PHQ2  PHQ9  GAD7  AUDIT - Alcohol Screen  HARK - Interpersonal Violence  Falls Risk - adults 65+  Diabetes - Foot Exam      COVID-19 Vaccine Summary  Which COVID-19 Vaccine was administered  Type:  Dates Given:          Date last mAB infusion:  07/18/2019          If no: Are you interested in scheduling? Declines vaccine    Immunization History   Administered Date(s) Administered    Hepatitis A Vaccine - Unspecified Formulation 10/06/2011, 03/30/2014    Hepatitis B vaccine, pediatric/adolescent dosage, 10/06/2011, 11/06/2011, 03/30/2014    INFLUENZA QUAD HIGH DOSE 48YRS+(FLUZONE) 04/08/2013    INFLUENZA TIV (TRI) 16MO+ W/ PRESERV (IM) 03/15/2011, 03/30/2014, 04/15/2015, 03/27/2016, 03/27/2017, 04/24/2018    Influenza Vaccine Quad (IIV4 PF) 6-21mo 04/15/2020    Influenza Vaccine Quad (IIV4 PF) 27mo+ injectable 04/15/2015, 03/27/2016, 03/27/2017, 04/24/2018    Influenza Virus Vaccine, unspecified formulation 07/03/2019    PNEUMOCOCCAL POLYSACCHARIDE 23 06/22/2016, 03/27/2017    PPD Test 03/07/2012, 05/12/2012    Pneumococcal Conjugate 13-Valent 05/25/2011, 03/27/2016    SHINGRIX-ZOSTER VACCINE (HZV), RECOMBINANT,SUB-UNIT,ADJUVANTED IM 03/27/2017, 04/24/2018    TdaP 08/11/2008, 06/11/2016       __________________________________________________________________________________________  SCREENINGS COMPLETED IN FLOWSHEETS    HARK Screening  HARK Screening  Within the last year, have you been humiliated or emotionally abused in other ways by your partner or ex-partner?: No  Within the last year, have you been afraid of your partner or ex-partner?: No  Within the last year, have you been raped or forced to have any kind of sexual activity by your partner or ex-partner?: No  Within the last year, have you been kicked, hit, slapped, or otherwise physically hurt by your partner or ex-partner?: No    PHQ2  PHQ-2 Total Score : 0    PHQ9  Thoughts that you would be better off dead, or of hurting yourself in some way: Not at all  PHQ-9 TOTAL SCORE: 0    GAD7    Over the last 2 weeks, how often have you been bothered by the following problems?  Feeling nervous, anxious or on edge: Not at all  Not being able to stop or control worrying: Not at all  Worrying too much about different things: Not at all  Trouble relaxing: Not at all  Being so restless that it is hard to sit still: Not at all  Becoming easily annoyed or irritable: Nearly every day  Feeling afraid as if something awful might happen: Not at all  GAD-7 Total Score: 3  How difficult have these problems made it for you to do your work, take care of things at home, or get along with other people?: Not at all difficult    Falls Risk  Falls Risk  Have you fallen in the past year?: No  Do you feel unsteady when standing or walking?: No

## 2021-09-20 NOTE — Unmapped (Signed)
I saw and evaluated the patient, participating in the key portions of the service.?? I reviewed the resident???s note.?? I agree with the resident???s findings and plan. Klea Nall C Manha Amato, MD

## 2021-09-22 NOTE — Unmapped (Signed)
Richard [name] 's entire shipment will be delayed as a result of the medication is too soon to refill until 09/23/2021 (both meds).     I have reached out to the patient  at [phone] and communicated the delivery change. We will reschedule the medication for the delivery date that the patient agreed upon.  We have confirmed the delivery date as 09/26/2021, via ups.

## 2021-09-22 NOTE — Unmapped (Signed)
[facility] Internal [facility]   CARE MANAGEMENT ENCOUNTER           Date of Service:  09/22/2021      Service:  Care Coordination - phone  MyChart use by patient is active: yes    Post-outreach Action Items:  Provider: No/none.  CM: No/none.  Patient: No/none.    Purpose of contact:     [employer] Fellow (MS) received fax from [facility] pharmacy that patient's dexcom was dispensed through his pharmacy (document in media tab).    [employer] Fellow (MS) completed the following related to the above request:  Reviewed chart    MS called patient to confirm that he received the Dexcom (P: [phone])  Patient confirmed he received it. MS encouraged patient to reach out if he had any questions.     Sent information to Mel Almond, MD     Provider/Care Partner(s)  to follow up on:   N/A    A copy of this Patient Outreach Encounter was sent to patient's Primary Care Provider    Time Spent: 10 min

## 2021-09-23 MED FILL — MYCOPHENOLATE MOFETIL 250 MG CAPSULE: ORAL | 30 days supply | Qty: 60 | Fill #2

## 2021-09-23 MED FILL — PROGRAF 0.5 MG CAPSULE: ORAL | 30 days supply | Qty: 60 | Fill #2

## 2021-10-13 NOTE — Unmapped (Signed)
Referring Provider: Linnell Fulling, ANP     PCP:  Mel Almond, MD    10/13/2021    Chief Complaint: CKD4    Assessment/Plan:  Richard [name] is a 56 y.o. patient with a PMH of *** who is being seen in consultation for ***.     CKD Stage G4A3: Secondary to ***.   -The patient was counseled on avoiding nephrotoxic agents including but not limited to NSAIDs and contrasted studies.    MBD:     Hypertension:    Anemia: ***    Transplant status:    Healthcare maintenance:  - Statin:  - Aspirin:     Patient will return to clinic in ***.     I personally spent *** minutes face-to-face and non-face-to-face in the care of this patient, which includes all pre, intra, and post visit time on the date of service.    [name]  Nephrology and Hypertension  Pager: [phone]  Office: [phone]  October 13, 2021 11:34 AM  ~~~~~~~~~~~~~~~~~~~~~~~~~~~~~~~~~~~~~~~~~~~~~~~~~~~~~~~~~~~~~~~~~~~~~~~~~~~~~~~~~~~~~~~~~~~~~~~~    HPI:  Richard [name] is a 56 y.o. patient with a past medical history of NASH cirrhosis s/p OLT 2013), T2DM, HTN and nephrolithiasis who is seen in consultation for CKD at the request of Linnell Fulling.     Followed previously by [facility].    ROS:  All other systems negative unless otherwise noted    PAST MEDICAL HISTORY:  Past Medical History:   Diagnosis Date    COVID-19 07/18/2019    Diabetes mellitus (CMS-HCC)     Family history of malignant neoplasm of prostate 06/23/2015    Hepatic cirrhosis (CMS-HCC) 06/23/2015    Overview:  Secondary to NASH; followed by Dr. Yevonne Pax, GI.  Last Assessment & Plan:  Relevant Hx: Course: Daily Update: Today's Plan:     Hypertension     Liver cirrhosis secondary to NASH (nonalcoholic steatohepatitis) (CMS-HCC)     s/p liver transplant 2013     ALLERGIES  Insulin asp prt-insulin aspart and Insulin aspart  SOCIAL HISTORY  Social History     Socioeconomic History    Marital status: Married   Tobacco Use    Smoking status: Former    Smokeless tobacco: Never   Substance and Sexual Activity    Alcohol use: No    Drug use: No    Sexual activity: Yes     Partners: Female     Social Determinants of Psychologist, prison and probation services Strain: Low Risk     Difficulty of Paying Living Expenses: Not hard at all   Food Insecurity: No Food Insecurity    Worried About Programme researcher, broadcasting/film/video in the Last Year: Never true    Barista in the Last Year: Never true   Transportation Needs: No Transportation Needs    Lack of Transportation (Medical): No    Lack of Transportation (Non-Medical): No       FAMILY HISTORY  Family History   Problem Relation Age of Onset    Diabetes Father     Diabetes Paternal Grandfather       MEDICATIONS:  Current Outpatient Medications   Medication Sig Dispense Refill    amLODIPine (NORVASC) 5 MG tablet Take 1 tablet (5 mg total) by mouth daily. 90 tablet 3    blood sugar diagnostic (CONTOUR NEXT TEST STRIPS) Strp Use to check blood sugar as directed with insulin 4 times a day & for symptoms of high or low  blood sugar. 100 each 0    blood-glucose meter Misc USE AS DIRECTED 1 each 0    blood-glucose meter,continuous (DEXCOM G6 RECEIVER) Misc 1 each by Miscellaneous route in the morning. Dispense 1 receiver annually.  Sent to [facility]. 1 each 0    blood-glucose sensor (DEXCOM G6 SENSOR) Devi Use as directed to monitor blood glucose (Patient not taking: Reported on 09/16/2021) 3 each 0    blood-glucose sensor (DEXCOM G6 SENSOR) Devi 1 each by Miscellaneous route in the morning. [facility]. 9 each 3    blood-glucose transmitter (DEXCOM G6 TRANSMITTER) Devi Use as directed to monitor blood glucose (Patient not taking: Reported on 09/16/2021) 1 each 0    blood-glucose transmitter (DEXCOM G6 TRANSMITTER) Devi 1 each by Miscellaneous route Every three (3) months. [facility] pharmacy 3 each 4    CHOLECALCIFEROL, VITAMIN D3, (VITAMIN D3 ORAL) Take 2,000 Units by mouth daily.      furosemide (LASIX) 20 MG tablet Take 2 tablets (40 mg total) by mouth daily before breakfast. 60 tablet 0    glucagon (BAQSIMI) 3 mg/actuation Spry Use 1 spray into the left nostril once as needed for up to 1 dose. (Patient not taking: Reported on 09/19/2021) 2 each 0    hydrALAZINE (APRESOLINE) 25 MG tablet Take 1 tablet (25 mg total) by mouth four (4) times a day. 120 tablet 0    insulin aspart, niacinamide, (FIASP FLEXTOUCH U-100 INSULIN) 100 unit/mL (3 mL) InPn Inject 7 Units under the skin Three (3) times a day. 18 mL 4    insulin degludec (TRESIBA FLEXTOUCH U-100) 100 unit/mL (3 mL) InPn Inject 0.12 mL (12 Units total) under the skin daily. 15 mL 1    isosorbide mononitrate (IMDUR) 30 MG 24 hr tablet Take 2 tablets (60 mg total) by mouth daily. 60 tablet 0    lancets Misc Use to check blood sugar as directed with insulin 4 times a day & for symptoms of high or low blood sugar. 100 each 0    magnesium oxide (MAG-OX) 400 mg (241.3 mg elemental magnesium) tablet Take 1 tablet (400 mg total) by mouth.      multivitamin (TAB-A-VITE/THERAGRAN) per tablet Take 1 tablet by mouth daily.      mycophenolate (CELLCEPT) 250 mg capsule Take 1 capsule (250 mg total) by mouth Two (2) times a day. 60 capsule 11    nitroglycerin (NITROSTAT) 0.4 MG SL tablet Place 1 tablet (0.4 mg total) under the tongue every five (5) minutes as needed for chest pain. Maximum of 3 doses in 15 minutes. (Patient not taking: Reported on 09/16/2021) 25 tablet 0    pen needle, diabetic 32 gauge x 5/32 (4 mm) Ndle Use with insulin up to 4 times a day as needed. 100 each 0    tacrolimus (PROGRAF) 0.5 MG capsule Take 1 capsule (0.5 mg total) by mouth two (2) times a day. 180 capsule 3     No current facility-administered medications for this visit.     PHYSICAL EXAM:     There were no vitals filed for this visit.  Gen: alert, well appearing conversant without difficulty ***  HEENT: moist mucous membranes, oropharynx clear without erythema, EOMI, sclera anicteric  Neck: Supple, no LAD  Card: regular, +S1/S2, no murmurs or rubs appreciated  Pulm: grossly clear to auscultation bilaterally, no wheezing appreciated  GI: Soft, active bowel sounds, nontender  Ext: No lower extremity edema noted bilaterally  Skin: No rashes or lesions appreciated  Neuro: Strength and sensation  grossly intact, no focal motor or sensory deficits appreciated    MEDICAL DECISION MAKING  Results for orders placed or performed in visit on 09/16/21   Tacrolimus Level, Trough   Result Value Ref Range    Tacrolimus, Trough 2.6 (L) 5.0 - 15.0 ng/mL   Gamma GT   Result Value Ref Range    GGT 89 (H) 0 - 73 U/L   Magnesium Level   Result Value Ref Range    Magnesium 1.8 1.6 - 2.6 mg/dL   Phosphorus Level   Result Value Ref Range    Phosphorus 4.1 2.4 - 5.1 mg/dL   Bilirubin, Direct   Result Value Ref Range    Bilirubin, Direct 0.40 (H) 0.00 - 0.30 mg/dL   Comprehensive Metabolic Panel   Result Value Ref Range    Sodium 138 135 - 145 mmol/L    Potassium 3.0 (L) 3.4 - 4.8 mmol/L    Chloride 96 (L) 98 - 107 mmol/L    CO2 30.0 20.0 - 31.0 mmol/L    Anion Gap 12 5 - 14 mmol/L    BUN 40 (H) 9 - 23 mg/dL    Creatinine 2.84 (H) 0.60 - 1.10 mg/dL    BUN/Creatinine Ratio 11     eGFR CKD-EPI (2021) Male 18 (L) >=60 mL/min/1.41m2    Glucose 174 (H) 70 - 99 mg/dL    Calcium 9.5 8.7 - 13.2 mg/dL    Albumin 3.7 3.4 - 5.0 g/dL    Total Protein 7.4 5.7 - 8.2 g/dL    Total Bilirubin 1.1 0.3 - 1.2 mg/dL    AST 33 <=44 U/L    ALT 26 10 - 49 U/L    Alkaline Phosphatase 111 46 - 116 U/L   Serum Free Light Chains   Result Value Ref Range    Kappa Free, Serum 10.75 (H) 0.33 - 1.94 mg/dL    Lambda Free, Serum 0.10 (H) 0.57 - 2.63 mg/dL    K/L FLC Ratio 2.72 (H) 0.26 - 1.65   Albumin / creatinine urine ratio   Result Value Ref Range    Creat U 78.5 Undefined mg/dL    Albumin Quantitative, Urine 80.4 Undefined mg/dL    Albumin/Creatinine Ratio 1,024.2 (H) 0.0 - 30.0 ug/mg   CBC w/ Differential   Result Value Ref Range    WBC 10.8 3.6 - 11.2 10*9/L    RBC 4.47 4.26 - 5.60 10*12/L    HGB 12.4 (L) 12.9 - 16.5 g/dL    HCT 53.6 (L) 64.4 - 48.0 %    MCV 79.5 77.6 - 95.7 fL    MCH 27.7 25.9 - 32.4 pg    MCHC 34.8 32.0 - 36.0 g/dL    RDW 03.4 74.2 - 59.5 %    MPV 7.4 6.8 - 10.7 fL    Platelet 278 150 - 450 10*9/L    Neutrophils % 77.6 %    Lymphocytes % 13.6 %    Monocytes % 5.5 %    Eosinophils % 2.8 %    Basophils % 0.5 %    Absolute Neutrophils 8.4 (H) 1.8 - 7.8 10*9/L    Absolute Lymphocytes 1.5 1.1 - 3.6 10*9/L    Absolute Monocytes 0.6 0.3 - 0.8 10*9/L    Absolute Eosinophils 0.3 0.0 - 0.5 10*9/L    Absolute Basophils 0.1 0.0 - 0.1 10*9/L    Microcytosis Slight (A) Not Present   Monoclonal Gammopathy Workup, Serum   Result Value Ref Range    T Albumin  4.1 3.5 - 5.0 g/dL    Alpha-1 Globulin 0.3 0.2 - 0.5 g/dL    Alpha-2 Globulin 0.8 0.5 - 1.1 g/dL    Beta-1 Globulin 0.4 0.3 - 0.6 g/dL    Beta-2 Globulin 0.3 0.2 - 0.6 g/dL    Gammaglobulin 1.5 0.5 - 1.5 g/dL    SPE Interpretation      Immunofixation Electrophoresis, Serum      Total Protein 7.4 g/dL   Monoclonal Gammopathy Workup Chemistries   Result Value Ref Range    Total Protein 7.1 5.7 - 8.2 g/dL    Total IgG 1,610 960 - 2,013 mg/dL    IgM 454 40 - 098 mg/dL    IgA 119.1 47.8 - 295.6 mg/dL     *Note: Due to a large number of results and/or encounters for the requested time period, some results have not been displayed. A complete set of results can be found in Results Review.        Creatinine trend:  Creatinine   Date Value Ref Range Status   09/16/2021 3.77 (H) 0.60 - 1.10 mg/dL Final   21/30/8657 8.46 (H) 0.60 - 1.10 mg/dL Final   96/29/5284 1.32 (H) 0.60 - 1.10 mg/dL Final   44/06/270 5.36 (H) 0.60 - 1.10 mg/dL Final   64/40/3474 2.59 (H) 0.60 - 1.10 mg/dL Final   56/38/7564 3.32 mg/dL Final   95/18/8416 6.06 (H) 0.70 - 1.30 mg/dL Final   30/16/0109 3.23 (H) 0.70 - 1.30 mg/dL Final   55/73/2202 5.42 (H) 0.70 - 1.30 mg/dL Final   70/62/3762 8.31  Final   03/10/2013 1.29  Final         Urine Sediment: ***    IMAGING STUDIES: Reviewed    Biopsy 06/04/2019: MCHC 34.8 32.0 - 36.0 g/dL    RDW 51.7 61.6 - 07.3 %    MPV 7.4 6.8 - 10.7 fL    Platelet 278 150 - 450 10*9/L    Neutrophils % 77.6 %    Lymphocytes % 13.6 %    Monocytes % 5.5 %    Eosinophils % 2.8 %    Basophils % 0.5 %    Absolute Neutrophils 8.4 (H) 1.8 - 7.8 10*9/L    Absolute Lymphocytes 1.5 1.1 - 3.6 10*9/L    Absolute Monocytes 0.6 0.3 - 0.8 10*9/L    Absolute Eosinophils 0.3 0.0 - 0.5 10*9/L    Absolute Basophils 0.1 0.0 - 0.1 10*9/L    Microcytosis Slight (A) Not Present   Monoclonal Gammopathy Workup, Serum   Result Value Ref Range    T Albumin 4.1 3.5 - 5.0 g/dL    Alpha-1 Globulin 0.3 0.2 - 0.5 g/dL    Alpha-2 Globulin 0.8 0.5 - 1.1 g/dL    Beta-1 Globulin 0.4 0.3 - 0.6 g/dL    Beta-2 Globulin 0.3 0.2 - 0.6 g/dL    Gammaglobulin 1.5 0.5 - 1.5 g/dL    SPE Interpretation      Immunofixation Electrophoresis, Serum      Total Protein 7.4 g/dL   Monoclonal Gammopathy Workup Chemistries   Result Value Ref Range    Total Protein 7.1 5.7 - 8.2 g/dL    Total IgG 7,106 269 - 2,013 mg/dL    IgM 485 40 - 462 mg/dL    IgA 703.5 00.9 - 381.8 mg/dL     *Note: Due to a large number of results and/or encounters for the requested time period, some results have not been displayed. A complete set of results  can be found in Results Review.        Creatinine trend:  Creatinine   Date Value Ref Range Status   09/16/2021 3.77 (H) 0.60 - 1.10 mg/dL Final   09/81/1914 7.82 (H) 0.60 - 1.10 mg/dL Final   95/62/1308 6.57 (H) 0.60 - 1.10 mg/dL Final   84/69/6295 2.84 (H) 0.60 - 1.10 mg/dL Final   13/24/4010 2.72 (H) 0.60 - 1.10 mg/dL Final   53/66/4403 4.74 mg/dL Final   25/95/6387 5.64 (H) 0.70 - 1.30 mg/dL Final   33/29/5188 4.16 (H) 0.70 - 1.30 mg/dL Final   60/63/0160 1.09 (H) 0.70 - 1.30 mg/dL Final   32/35/5732 2.02  Final   03/10/2013 1.29  Final         Urine Sediment: Not examined today    IMAGING STUDIES: Reviewed    Biopsy 06/04/2019:

## 2021-10-14 ENCOUNTER — Ambulatory Visit
Admit: 2021-10-14 | Discharge: 2021-10-15 | Payer: PRIVATE HEALTH INSURANCE | Attending: Nephrology | Primary: Nephrology

## 2021-10-14 DIAGNOSIS — Z944 Liver transplant status: Principal | ICD-10-CM

## 2021-10-14 DIAGNOSIS — N289 Disorder of kidney and ureter, unspecified: Principal | ICD-10-CM

## 2021-10-14 LAB — RENAL FUNCTION PANEL
ALBUMIN: 3.4 g/dL (ref 3.4–5.0)
ANION GAP: 9 mmol/L (ref 5–14)
BLOOD UREA NITROGEN: 47 mg/dL — ABNORMAL HIGH (ref 9–23)
BUN / CREAT RATIO: 14
CALCIUM: 9.1 mg/dL (ref 8.7–10.4)
CHLORIDE: 103 mmol/L (ref 98–107)
CO2: 26.8 mmol/L (ref 20.0–31.0)
CREATININE: 3.38 mg/dL — ABNORMAL HIGH
EGFR CKD-EPI (2021) MALE: 21 mL/min/{1.73_m2} — ABNORMAL LOW (ref >=60)
GLUCOSE RANDOM: 103 mg/dL — ABNORMAL HIGH (ref 70–99)
PHOSPHORUS: 3.7 mg/dL (ref 2.4–5.1)
POTASSIUM: 3.1 mmol/L — ABNORMAL LOW (ref 3.4–4.8)
SODIUM: 139 mmol/L (ref 135–145)

## 2021-10-14 LAB — PARATHYROID HORMONE (PTH): PARATHYROID HORMONE INTACT: 267.5 pg/mL — ABNORMAL HIGH (ref 18.4–80.1)

## 2021-10-14 NOTE — Unmapped (Signed)
[facility] Refill Coordination Note    Specialty Medication(s) to be Shipped:   Transplant: mycophenolate mofetil 250mg  and Prograf 0.5mg     Other medication(s) to be shipped: No additional medications requested for fill at this time     Richard [name], DOB: [birth year]  Phone: [phone] (home)       All above HIPAA information was verified with patient.     Was a Nurse, learning disability used for this call? No    Completed refill call assessment today to schedule patient's medication shipment from the [facility] [phone]).  All relevant notes have been reviewed.     Specialty medication(s) and dose(s) confirmed: Regimen is correct and unchanged.   Changes to medications: Haile reports no changes at this time.  Changes to insurance: No  New side effects reported not previously addressed with a pharmacist or physician: None reported  Questions for the pharmacist: No    Confirmed patient received a Conservation officer, historic buildings and a Surveyor, mining with first shipment. The patient will receive a drug information handout for each medication shipped and additional FDA Medication Guides as required.       DISEASE/MEDICATION-SPECIFIC INFORMATION        N/A    SPECIALTY MEDICATION ADHERENCE     Medication Adherence    Patient reported X missed doses in the last month: 0  Specialty Medication: Mycophenolate 250mg   Patient is on additional specialty medications: Yes  Additional Specialty Medications: Prograf 0.5mg   Patient Reported Additional Medication X Missed Doses in the Last Month: 0  Patient is on more than two specialty medications: No  Adherence tools used: patient uses a pill box to manage medications              Were doses missed due to medication being on hold? No    Mycophenolate 250 mg: 7 days of medicine on hand   Prograf 0.5 mg: 7 days of medicine on hand        REFERRAL TO PHARMACIST     Referral to the pharmacist: Not needed      [facility]     Shipping address confirmed in Epic.     Delivery Scheduled: Yes, Expected medication delivery date: 10/19/21.     Medication will be delivered via UPS to the prescription address in Epic WAM.    [name]   [facility] Specialty Technician

## 2021-10-17 DIAGNOSIS — N289 Disorder of kidney and ureter, unspecified: Principal | ICD-10-CM

## 2021-10-17 MED ORDER — SPIRONOLACTONE 25 MG TABLET
ORAL_TABLET | Freq: Every day | ORAL | 3 refills | 90.00000 days | Status: CP
Start: 2021-10-17 — End: 2021-10-17

## 2021-10-17 MED ORDER — SEMAGLUTIDE 0.25 MG OR 0.5 MG (2 MG/1.5 ML) SUBCUTANEOUS PEN INJECTOR
SUBCUTANEOUS | 0 refills | 84.00000 days | Status: CP
Start: 2021-10-17 — End: 2021-10-17

## 2021-10-17 NOTE — Unmapped (Signed)
Addended by: [name] on: 10/17/2021 09:47 AM     Modules accepted: Orders

## 2021-10-17 NOTE — Unmapped (Signed)
Labs from Friday with eGFR just above 20, hypokalemia and continued albuminuria.    We will have him stop his hydralazine and start spironolactone 25 mg daily. We will have him start semaglutide 0.25 mg weekly x 4 weeks, then increase to 0.5 mg weekly. When increasing to 0.5 mg weekly, he can drop his long-acting insulin dose by 20% to 9U daily.    Richard [name]  Nephrology and Hypertension  Pager: [phone]  Office: [phone]  October 17, 2021 9:43 AM

## 2021-10-18 NOTE — Unmapped (Signed)
Richard [name] 's Mycophenolate and Prograf shipment will be delayed as a result of the medication is too soon to refill until 10/23/2021.     I have reached out to the patient  at [phone] and communicated the delivery change. We will reschedule the medication for the delivery date that the patient agreed upon.  We have confirmed the delivery date as 10/25/2021, via ups.

## 2021-10-19 ENCOUNTER — Ambulatory Visit: Admit: 2021-10-19 | Discharge: 2021-10-20 | Payer: PRIVATE HEALTH INSURANCE

## 2021-10-19 DIAGNOSIS — L300 Nummular dermatitis: Principal | ICD-10-CM

## 2021-10-19 DIAGNOSIS — Z944 Liver transplant status: Principal | ICD-10-CM

## 2021-10-19 DIAGNOSIS — L578 Other skin changes due to chronic exposure to nonionizing radiation: Principal | ICD-10-CM

## 2021-10-19 DIAGNOSIS — L814 Other melanin hyperpigmentation: Principal | ICD-10-CM

## 2021-10-19 DIAGNOSIS — B372 Candidiasis of skin and nail: Principal | ICD-10-CM

## 2021-10-19 DIAGNOSIS — D229 Melanocytic nevi, unspecified: Principal | ICD-10-CM

## 2021-10-19 DIAGNOSIS — D849 Immunodeficiency, unspecified: Principal | ICD-10-CM

## 2021-10-19 DIAGNOSIS — L821 Other seborrheic keratosis: Principal | ICD-10-CM

## 2021-10-19 DIAGNOSIS — B351 Tinea unguium: Principal | ICD-10-CM

## 2021-10-19 MED ORDER — TERBINAFINE HCL 1 % TOPICAL CREAM
11 refills | 0 days | Status: CP
Start: 2021-10-19 — End: ?

## 2021-10-19 MED ORDER — TRIAMCINOLONE ACETONIDE 0.1 % TOPICAL CREAM
Freq: Two times a day (BID) | TOPICAL | 1 refills | 0 days | Status: CP
Start: 2021-10-19 — End: 2022-10-19

## 2021-10-19 NOTE — Unmapped (Signed)
Patient called for assistance with prescription. Pharmacy advised prior authorization needed for  semaglutide (OZEMPIC) 0.25 mg or 0.5 mg(2 mg/1.5 mL) PnIj injection. He asked if someone could reach out to insurance and give him a call.

## 2021-10-19 NOTE — Unmapped (Addendum)
Dermatology Note     Assessment and Plan:      Benign Lesions/ Findings:   Lentigo/Lentigines  Nevus/Nevi-Benign Appearing  Seborrheic Keratosis(es) - no irritation noted  - Reassurance provided regarding the benign appearance of lesions noted on exam today; no treatment is indicated in the absence of symptoms/changes.  - Reinforced importance of photoprotective strategies including liberal and frequent sunscreen use of a broad-spectrum SPF 30 or greater, use of protective clothing, and sun avoidance for prevention of cutaneous malignancy and photoaging.  Counseled patient on the importance of regular self-skin monitoring as well as routine clinical skin examinations as scheduled.     Lesion of concern:  - benign appearance, likely from trauma   - return for biopsy if not resolved in 1 month    Onychomycosis and Erosio interdigitalis blastomycetica, chronic, flaring, not at patient goal  - discussed etiology, natural history, prognosis of condition  - terbinafine HCL (LAMISIL) 1 % cream; Apply to left big toenail and between left smallest toe and the toe adjacent to it (between 4th and 5th toes) twice per day    Nummular eczema, chronic, flaring, not at patient goal  - discussed etiology, natural history, prognosis of condition  - triamcinolone (KENALOG) 0.1 % cream; Apply topically Two (2) times a day. Apply to itchy rash until skin is smooth, then stop. Restart as needed    The patient was advised to call for an appointment should any new, changing, or symptomatic lesions develop.     RTC: Return in about 1 year (around 10/20/2022). or sooner as needed   _________________________________________________________________      Chief Complaint     Chief Complaint   Patient presents with   ??? Medication Refill     To check on medication         HPI     Richard [name] is a 56 y.o. male who presents as a new patient to Dermatology for a full body skin exam. Patient reports a lesion of concern.     Lesion is on left extensor elbow. Tender. Appeared 2 days ago    The patient denies any other new or changing lesions or areas of concern.     Pertinent Past Medical History     No history of skin cancer  T2MI, CAD, T2DM  liver transplantation 2/2 NAFLD on 03/02/2012  Immunosuppression:  -tacrolimus 0.5 mg BID  mycophenolate  Problem List        Other    Liver replaced by transplant (CMS-HCC) (Chronic)     In 2013          Family History:   Negative for melanoma    Past Medical History, Family History, Social History, Medication List, Allergies, and Problem List were reviewed in the rooming section of Epic.     ROS: Other than symptoms mentioned in the HPI, no fevers, chills, or other skin complaints    Physical Examination     GENERAL: Well-appearing male in no acute distress, resting comfortably.  NEURO: Alert and oriented, answers questions appropriately  SKIN (Full Skin Exam): Examination of the face, eyelids, lips, nose, ears, neck, chest, abdomen, back, arms, legs, hands, feet, palms, soles, nails was performed, including scalp  - Actinic Elastosis: mild chronic sun damage: dyspigmentation, telangiectasia, and wrinkling  - Lentigo/lentigines: Scattered pigmented macules that are tan to brown in color and are somewhat non-uniform in shape and concentrated in the sun-exposed areas of the face and upper extremities  - Nevus/nevi: Scattered well-demarcated,  regular, pigmented macule(s) and/or papule(s) on the scattered diffusely  - Seborrheic Keratosis(es): Stuck-on appearing keratotic papule(s) on the scattered diffusely, none irritated with redness, crusting, edema, and/or partial avulsion     Circular scaly eczematous plaques on left lower anterior leg and right lower posterior leg  Discoloration and subungual debris of left first toenail  Macerated papule between 4th and 5th toes of left foot    Lesion of concern: papule with hemorrhagic crusting, likely trauma    All areas not commented on are within normal limits or unremarkable      (Approved Template 03/08/2020)

## 2021-10-19 NOTE — Unmapped (Signed)
I discussed this patient's case including the history, clinical findings and assessment and plan with the resident physician.  I reviewed the resident???s note.  I agree with the plan as documented in the resident???s note.  I was available.    Saxton Chain, MD

## 2021-10-20 NOTE — Unmapped (Signed)
PA completed, submitted through CoverMyMeds for OZEMPIC 0.25 OR 0.5mg /dose, Qty.6. Request has been ruled as favorable, is APPROVED through 10/20/2021-10/19/2022.

## 2021-10-24 ENCOUNTER — Ambulatory Visit: Admit: 2021-10-24 | Discharge: 2021-10-26 | Payer: PRIVATE HEALTH INSURANCE

## 2021-10-24 LAB — COMPREHENSIVE METABOLIC PANEL
ALBUMIN: 3 g/dL — ABNORMAL LOW (ref 3.4–5.0)
ALKALINE PHOSPHATASE: 84 U/L (ref 46–116)
ALT (SGPT): 29 U/L (ref 10–49)
ANION GAP: 9 mmol/L (ref 5–14)
AST (SGOT): 29 U/L (ref <=34)
BILIRUBIN TOTAL: 1.5 mg/dL — ABNORMAL HIGH (ref 0.3–1.2)
BLOOD UREA NITROGEN: 46 mg/dL — ABNORMAL HIGH (ref 9–23)
BUN / CREAT RATIO: 13
CALCIUM: 9 mg/dL (ref 8.7–10.4)
CHLORIDE: 105 mmol/L (ref 98–107)
CO2: 26 mmol/L (ref 20.0–31.0)
CREATININE: 3.64 mg/dL — ABNORMAL HIGH
EGFR CKD-EPI (2021) MALE: 19 mL/min/{1.73_m2} — ABNORMAL LOW (ref >=60)
GLUCOSE RANDOM: 174 mg/dL (ref 70–179)
POTASSIUM: 3.6 mmol/L (ref 3.4–4.8)
PROTEIN TOTAL: 6.9 g/dL (ref 5.7–8.2)
SODIUM: 140 mmol/L (ref 135–145)

## 2021-10-24 LAB — CBC W/ AUTO DIFF
BASOPHILS ABSOLUTE COUNT: 0.1 10*9/L (ref 0.0–0.1)
BASOPHILS RELATIVE PERCENT: 0.8 %
EOSINOPHILS ABSOLUTE COUNT: 0.1 10*9/L (ref 0.0–0.5)
EOSINOPHILS RELATIVE PERCENT: 1.1 %
HEMATOCRIT: 35 % — ABNORMAL LOW (ref 39.0–48.0)
HEMOGLOBIN: 11.6 g/dL — ABNORMAL LOW (ref 12.9–16.5)
LYMPHOCYTES ABSOLUTE COUNT: 0.9 10*9/L — ABNORMAL LOW (ref 1.1–3.6)
LYMPHOCYTES RELATIVE PERCENT: 8.7 %
MEAN CORPUSCULAR HEMOGLOBIN CONC: 33.2 g/dL (ref 32.0–36.0)
MEAN CORPUSCULAR HEMOGLOBIN: 27.2 pg (ref 25.9–32.4)
MEAN CORPUSCULAR VOLUME: 81.8 fL (ref 77.6–95.7)
MEAN PLATELET VOLUME: 8 fL (ref 6.8–10.7)
MONOCYTES ABSOLUTE COUNT: 0.6 10*9/L (ref 0.3–0.8)
MONOCYTES RELATIVE PERCENT: 5.3 %
NEUTROPHILS ABSOLUTE COUNT: 9.2 10*9/L — ABNORMAL HIGH (ref 1.8–7.8)
NEUTROPHILS RELATIVE PERCENT: 84.1 %
PLATELET COUNT: 268 10*9/L (ref 150–450)
RED BLOOD CELL COUNT: 4.28 10*12/L (ref 4.26–5.60)
RED CELL DISTRIBUTION WIDTH: 13.8 % (ref 12.2–15.2)
WBC ADJUSTED: 10.9 10*9/L (ref 3.6–11.2)

## 2021-10-24 LAB — HIGH SENSITIVITY TROPONIN I - 2H/6H SERIAL
HIGH SENSITIVITY TROPONIN - DELTA (0-2H): 33 ng/L (ref <=7)
HIGH-SENSITIVITY TROPONIN I - 2 HOUR: 276 ng/L (ref <=53)

## 2021-10-24 LAB — MAGNESIUM: MAGNESIUM: 1.8 mg/dL (ref 1.6–2.6)

## 2021-10-24 LAB — HIGH SENSITIVITY TROPONIN I - SINGLE: HIGH SENSITIVITY TROPONIN I: 309 ng/L (ref <=53)

## 2021-10-24 LAB — B-TYPE NATRIURETIC PEPTIDE: B-TYPE NATRIURETIC PEPTIDE: 1115 pg/mL — ABNORMAL HIGH (ref <=100)

## 2021-10-24 MED ADMIN — aspirin chewable tablet 324 mg: 324 mg | ORAL | @ 16:00:00 | Stop: 2021-10-24

## 2021-10-24 MED ADMIN — magnesium oxide (MAG-OX) tablet 400 mg: 400 mg | ORAL | @ 21:00:00

## 2021-10-24 MED ADMIN — spironolactone (ALDACTONE) tablet 25 mg: 25 mg | ORAL | @ 21:00:00

## 2021-10-24 MED ADMIN — furosemide (LASIX) injection 80 mg: 80 mg | INTRAVENOUS | @ 18:00:00 | Stop: 2021-10-24

## 2021-10-24 NOTE — Unmapped (Signed)
Pt here with SOB and chest pain this morning. Pt stated it started about four days ago but has gotten worse with time. Laying down makes the pain and SOB worse. Pt has hx of kidney disease. Pt doesn't feel like he is fluid overloaded. Not on dialysis

## 2021-10-24 NOTE — Unmapped (Signed)
Tacrolimus Therapeutic Monitoring Pharmacy Note    Richard [name] is a 56 y.o. male continuing tacrolimus.     Indication: Liver transplant     Date of Transplant: 2013      Prior Dosing Information: Home regimen 0.5 mg BID     Goals:  Therapeutic Drug Levels  Tacrolimus trough goal: 2-4 ng/mL    Additional Clinical Monitoring/Outcomes  ?? Monitor renal function (SCr and urine output) and liver function (LFTs)  ?? Monitor for signs/symptoms of adverse events (e.g., hyperglycemia, hyperkalemia, hypomagnesemia, hypertension, headache, tremor)    Results:   Tacrolimus level: Not applicable    Pharmacokinetic Considerations and Significant Drug Interactions:  ??? Concurrent hepatotoxic medications: None identified  ??? Concurrent CYP3A4 substrates/inhibitors: None identified  ??? Concurrent nephrotoxic medications: None identified    Assessment/Plan:  Recommendedation(s)  ??? Continue current regimen of 0.5 mg BID    Follow-up  ??? Next level has been ordered on 05/02 at 0600.   ??? A pharmacist will continue to monitor and recommend levels as appropriate    Please page service pharmacist with questions/clarifications.    Kennis Carina, PharmD

## 2021-10-24 NOTE — Unmapped (Signed)
Richard [name] 's Mycophenolate and Prograf shipment will be delayed as a result of the patient's insurance plan being in grace period (patient needs to pay insurance premium).     I have reached out to the patient  at [phone] and left a voicemail message.  We will wait for a call back from the patient to reschedule the delivery.  We have not confirmed the new delivery date.

## 2021-10-24 NOTE — Unmapped (Signed)
Patient complaining of cough, CP and SOB

## 2021-10-24 NOTE — Unmapped (Signed)
Emergency Department Provider Note      Medical Decision Making      Richard [name] is a 56 y.o. male w/ history of morbid obesity, former tobacco use, obstructive sleep apnea, hypertension, T2DM, NASH cirrhosis s/p liver transplant (2013) on tacrolimus, CAD s/p NSTEMI, and HFpEF (grade 2 diastolic dysfunction) who presents with shortness of breath and chest tightness.     On presentation, vitals were hypertensive to the 160s/110s but otherwise WNL on room air. Physical exam revealed chronically ill-appearing adult male who appeared dyspneic but with clear bilateral breath sounds, also with lower extremity edema the patient states is baseline.     Initial differential included volume overload from CHF exacerbation with other possibilities being progressive kidney disease, ACS, or arrhythmia.    Stat EKG showed normal rate, sinus rhythm with first-degree heart block, left axis deviation, right bundle branch block, and no ST segment deviations from prior EKG in chart.  Chest x-ray showed cardiomegaly with mild pulmonary edema.  CBC WNL.  CMP with baseline CKD (creatinine 3.6 similar to baseline) with total bilirubin elevated at 1.5.  Troponin is 309.    Given his work-up top suspicion became for CHF exacerbation given his description of worsening shortness of breath, orthopnea, lower extremity edema, and findings of pulmonary edema on exam chest x-ray.  I suspect that his elevated troponin is likely from demand from this given that his absence of chest pain, but will trend troponin.    I gave 80 mg of IV Lasix and contacted medicine for admission for IV diuresis, troponin trend, cardiology consultation, and further observation for improvement.    Notably, PE is a consideration, but given his more likely diagnosis of volume overload with low suspicion for PE I feel that he warrants diuresis prior to any consideration of CTA, particularly given his advanced renal.  Wells score of 0 for PE.    Disposition: Contacted medicine for admission  Social factors affecting disposition: None  Consultants involved: Medicine admitting service  Prior records reviewed: Recent echo from March 2023  Testing considered but not ordered: CTA as mentioned above    History     HPI: Richard [name] is a 56 y.o. male w/ history of morbid obesity, former tobacco use, obstructive sleep apnea, hypertension, T2DM, NASH cirrhosis s/p liver transplant (2013) on tacrolimus, CAD s/p NSTEMI, and HFpEF (grade 2 diastolic dysfunction) who presents with shortness of breath and chest tightness.  Reports that for the last 3 to 4 days has had continuous and progressive worsening of shortness of breath.  States that it feels very short when he lies flat.  Does state that his chest has felt tight in this time but says it is not pain or discomfort.  There is no radiation of the tightness and it is not exertional.  Denies nausea, vomiting, urinary changes (being the same on his daily 40 mg of Lasix), and changes in his baseline lower extremity swelling.    Allergies:   Allergies   Allergen Reactions    Insulin Asp Prt-Insulin Aspart      Other reaction(s): Other  Burning on injection and skin reaction.      Insulin Aspart Other (See Comments)     Burning on injection and skin rash (itchy)       Social:   Social History     Tobacco Use    Smoking status: Former    Smokeless tobacco: Never   Haematologist Use: Never used  Substance Use Topics    Alcohol use: No    Drug use: No       Physical Exam     Vitals:   Vitals:    10/24/21 1018 10/24/21 1045   BP:  166/111   Pulse: 68 83   Resp:  17   Temp:  36.8 ??C (98.2 ??F)   TempSrc:  Oral   SpO2: 98% 97%        Physical Exam:   General: Chronically ill-appearing adult male who appears dyspneic  Head: Atraumatic and normocephalic.    Eyes: Conjugate gaze. No scleral icterus or conjunctivitis.    ENT: Moist mucous membranes.   Cardiac: RRR. No MRG.    Pulmonary: Mildly increased work of breathing with normal inspiratory expiratory ratio.  No significant adventitious breath sounds bilaterally.  Abdominal: Soft, non-distended, non-tender abdomen.    GU: Deferred   MSK: Nonpitting edema of both lower extremities the patient states is baseline  Dermatologic: No jaundice, cyanosis, pallor, or rashes visible on exposed skin.    Neurologic: Alert. Follows commands. Moving all extremities spontaneously.    Psychiatric: Normal mood and behavior.        Glean Salen, MD   PGY-3, Emergency Medicine                   Sheppard Evens, MD  Resident  10/24/21 [phone]

## 2021-10-25 ENCOUNTER — Ambulatory Visit: Admit: 2021-10-25 | Payer: PRIVATE HEALTH INSURANCE | Attending: Pharmacist | Primary: Pharmacist

## 2021-10-25 LAB — BASIC METABOLIC PANEL
ANION GAP: 10 mmol/L (ref 5–14)
ANION GAP: 8 mmol/L (ref 5–14)
BLOOD UREA NITROGEN: 45 mg/dL — ABNORMAL HIGH (ref 9–23)
BLOOD UREA NITROGEN: 46 mg/dL — ABNORMAL HIGH (ref 9–23)
BUN / CREAT RATIO: 13
BUN / CREAT RATIO: 13
CALCIUM: 9.1 mg/dL (ref 8.7–10.4)
CALCIUM: 8.9 mg/dL (ref 8.7–10.4)
CHLORIDE: 106 mmol/L (ref 98–107)
CHLORIDE: 104 mmol/L (ref 98–107)
CO2: 25 mmol/L (ref 20.0–31.0)
CO2: 27 mmol/L (ref 20.0–31.0)
CREATININE: 3.41 mg/dL — ABNORMAL HIGH
CREATININE: 3.49 mg/dL — ABNORMAL HIGH
EGFR CKD-EPI (2021) MALE: 21 mL/min/{1.73_m2} — ABNORMAL LOW (ref >=60)
EGFR CKD-EPI (2021) MALE: 20 mL/min/{1.73_m2} — ABNORMAL LOW (ref >=60)
GLUCOSE RANDOM: 149 mg/dL (ref 70–179)
GLUCOSE RANDOM: 270 mg/dL — ABNORMAL HIGH (ref 70–179)
POTASSIUM: 3.3 mmol/L — ABNORMAL LOW (ref 3.4–4.8)
POTASSIUM: 3.7 mmol/L (ref 3.4–4.8)
SODIUM: 141 mmol/L (ref 135–145)
SODIUM: 139 mmol/L (ref 135–145)

## 2021-10-25 LAB — CBC
HEMATOCRIT: 35.6 % — ABNORMAL LOW (ref 39.0–48.0)
HEMOGLOBIN: 11.9 g/dL — ABNORMAL LOW (ref 12.9–16.5)
MEAN CORPUSCULAR HEMOGLOBIN CONC: 33.4 g/dL (ref 32.0–36.0)
MEAN CORPUSCULAR HEMOGLOBIN: 27.6 pg (ref 25.9–32.4)
MEAN CORPUSCULAR VOLUME: 82.7 fL (ref 77.6–95.7)
MEAN PLATELET VOLUME: 8.4 fL (ref 6.8–10.7)
PLATELET COUNT: 279 10*9/L (ref 150–450)
RED BLOOD CELL COUNT: 4.31 10*12/L (ref 4.26–5.60)
RED CELL DISTRIBUTION WIDTH: 13.9 % (ref 12.2–15.2)
WBC ADJUSTED: 9.2 10*9/L (ref 3.6–11.2)

## 2021-10-25 LAB — MAGNESIUM: MAGNESIUM: 1.7 mg/dL (ref 1.6–2.6)

## 2021-10-25 MED ADMIN — spironolactone (ALDACTONE) tablet 25 mg: 25 mg | ORAL | @ 14:00:00

## 2021-10-25 MED ADMIN — tacrolimus (PROGRAF) capsule 0.5 mg: .5 mg | ORAL | @ 05:00:00

## 2021-10-25 MED ADMIN — valsartan (DIOVAN) tablet 80 mg: 80 mg | ORAL | @ 15:00:00

## 2021-10-25 MED ADMIN — potassium chloride ER tablet 20 mEq: 20 meq | ORAL | @ 14:00:00 | Stop: 2021-10-25

## 2021-10-25 MED ADMIN — hydrALAZINE (APRESOLINE) injection 10 mg: 10 mg | INTRAVENOUS | @ 05:00:00

## 2021-10-25 MED ADMIN — insulin regular (HumuLIN,NovoLIN) injection 0-20 Units: 0-20 [IU] | SUBCUTANEOUS | @ 16:00:00

## 2021-10-25 MED ADMIN — isosorbide mononitrate (IMDUR) 24 hr tablet 60 mg: 60 mg | ORAL | @ 14:00:00

## 2021-10-25 MED ADMIN — amLODIPine (NORVASC) tablet 5 mg: 5 mg | ORAL | @ 14:00:00

## 2021-10-25 MED ADMIN — acetaminophen (TYLENOL) tablet 500 mg: 500 mg | ORAL | @ 04:00:00

## 2021-10-25 MED ADMIN — furosemide (LASIX) injection 80 mg: 80 mg | INTRAVENOUS | @ 14:00:00 | Stop: 2021-10-25

## 2021-10-25 MED ADMIN — mycophenolate (CELLCEPT) capsule 250 mg: 250 mg | ORAL | @ 15:00:00

## 2021-10-25 MED ADMIN — tacrolimus (PROGRAF) capsule 0.5 mg: .5 mg | ORAL | @ 15:00:00

## 2021-10-25 MED ADMIN — mycophenolate (CELLCEPT) capsule 250 mg: 250 mg | ORAL | @ 04:00:00

## 2021-10-25 MED ADMIN — insulin regular (HumuLIN,NovoLIN) injection 0-20 Units: 0-20 [IU] | SUBCUTANEOUS | @ 21:00:00

## 2021-10-25 MED ADMIN — furosemide (LASIX) injection 40 mg: 40 mg | INTRAVENOUS | @ 21:00:00 | Stop: 2021-10-25

## 2021-10-25 MED ADMIN — magnesium oxide (MAG-OX) tablet 400 mg: 400 mg | ORAL | @ 14:00:00

## 2021-10-25 MED ADMIN — aspirin chewable tablet 81 mg: 81 mg | ORAL | @ 15:00:00

## 2021-10-25 NOTE — Unmapped (Signed)
Nephrology ([facility]) Progress Note    Assessment & Plan:   Richard [name] is a 56 y.o. male with a PMHx of who presented to [facility] with     Principal Problem:    Dyspnea  Active Problems:    Hypertension    Type 2 diabetes mellitus with hyperglycemia, without long-term current use of insulin (CMS-HCC)    Liver replaced by transplant (CMS-HCC)    Morbid obesity with BMI of 45.0-49.9, adult (CMS-HCC)    Obstructive sleep apnea syndrome    CKD (chronic kidney disease) stage 4, GFR 15-29 ml/min (CMS-HCC)    Abnormal transaminases    Elevated troponin  Resolved Problems:    * No resolved hospital problems. *    Dyspnea - elevated troponin likely type II MI -resolved - HFpEF (grade 2 diastolic dysfunction)  Dyspnea is mildly improved from yesterday.  Persistent bilateral lower extremity edema.  Troponin peak of 309 yesterday.  Patient is ambulating well without requiring supplemental oxygen.  Etiology of dyspnea is most likely volume overload from CHF exacerbation and flash pulmonary edema.  Difficult to obtain comprehensive intake and output yesterday.  Patient endorses significant urine output overnight.  We will continue aggressive diuresis today.    Additionally we will address his hypertension as below.  -80mg  IV lasix this morning and 40mg  IV lasix this afternoon  -Monitor renal function  -Strict I's and O's  - continue home ASA 81mg , imdur 30mg  daily    ??HTN  Patient presented with hypertension to 180s/90s.  In the setting of his dyspnea, we are concerned for flash pulmonary edema.  Patient was hypertensive again last night to systolic of 200.  He was given as needed hydralazine.   -Continue current home amlodipine and spironolactone  -IV lasix as above  -Initiated valsartan today  -Hydralazine 10 mg IV as needed   ??  Hx liver transplant 2/2 NASH cirrhosis  Transaminases within normal limits upon presentation  Synthetic function intact.   - continue tacrolimus 0.5mg  BID, cellcept 250mg  BID    CKD  Cr elevated to 3.64 on admission, consistent with recent baseline of approximately 3.5.  Today reduced to 3.4.  We will continue to monitor with increased diuretic regimen.  - Trend Cr, minimize nephrotoxins  ??  Hypokalemia   Likely due to increased dosage of Lasix.  We will replete as needed.    DM  Home meds: insulin aspart 7 units 3 times daily as well as [place] 12 units daily.  Most recent A1c is 8.3.  -Continue sliding scale insulin  ??  Checklist:  Diet:??Diabetic Diet  DVT PPx:??Not Indicated - Padua Score <4  Electrolytes:??Replete Potassium to >/=4 and Magnesium to >/=2  Code Status:??Full Code  ??  Team Contact Information:   Primary Team: Nephrology ([facility])  Primary Resident: Richard Raisin, MD  Resident's Pager: [phone] (Nephrology Intern - Richard [name])    Interval History:   No acute events overnight.  Patient had an episode of hypotension systolic greater than 200 received as needed hydralazine.  At that time he noted severe headache as well, which resolved with reduction in blood pressure.  He continues to feel short of breath, but does not have supplemental oxygen requirement.  He is ambulatory.  Walked to [employer] today for lunch.    ROS: Denies abdominal pain, nausea, vomiting.  No further headache or chest pain.     Objective:   Temp:  [36.4 ??C (97.5 ??F)-37.2 ??C (99 ??F)] 36.8 ??C (98.2 ??F)  Heart Rate:  [  74-98] 74  Resp:  [17-20] 17  BP: (134-200)/(64-120) 134/74  SpO2:  [94 %-100 %] 96 %    Gen: WDWN in NAD, answers questions appropriately  Eyes: sclera anicteric, EOMI  HENT: atraumatic, MMM, OP w/o erythema or exudate   Heart: RRR, S1, S2, no M/R/G, no chest wall tenderness  Lungs: CTAB, no crackles or wheezes, no use of accessory muscles  Abdomen: soft, NTND, no rebound/guarding  Extremities: no clubbing, cyanosis.  Mild edema in the BLEs  Psych: Alert, oriented, appropriate mood and affect    Labs/Studies: Labs and Studies from the last 24hrs per EMR and Reviewed

## 2021-10-25 NOTE — Unmapped (Unsigned)
[facility]    HYPERTENSION    ASSESSMENT AND PLAN:  1. Type 2 MI (myocardial infarction) (CMS-HCC)    2. Primary hypertension    3. Splenic artery aneurysm (CMS-HCC)    4. CKD (chronic kidney disease) stage 4, GFR 15-29 ml/min (CMS-HCC)    5. Type 2 diabetes mellitus with hyperglycemia, without long-term current use of insulin (CMS-HCC)    6. Morbid obesity (CMS-HCC)        Hypertension {well-controlled:25230}  BP {Blank single:19197::well below goal without hypotensive symptoms,at goal,not at goal}. Goal ***  Currently taking ***  Plan: ***    CV risk reduction:  The ASCVD Risk score (Arnett DK, et al., 2019) failed to calculate.  Plan: ***    Follow-up: No follow-ups on file.    Future Appointments   Date Time Provider Department Center   10/25/2021  9:00 AM [facility], CPP [facility]   11/03/2021  9:00 AM Mel Almond, MD [facility]       Medication adherence and barriers to the treatment plan have been addressed. Opportunities to optimize healthy behaviors have been discussed. Patient / caregiver voiced understanding.      HISTORY OF PRESENT ILLNESS:  Richard [name] is a 55 y.o. year old male with *** who presents to the [facility] clinic for ***.     Today, ***    Diet:  Salt intake - ***  Caffeine intake - ***    Exercise:   ***    Social:  Tobacco - ***  Alcohol - ***    Home BP Monitoring:  ***  The patient???s Average Home Blood Pressure during the last two weeks is :   /   based on  readings            ALLERGIES/ADVERSE EVENTS: Reviewed and updated in Epic    MEDICATIONS: Reviewed and updated in Epic  Medication Discrepancies: ***  Medication Adherence: ***    ROS      VITALS:  There were no vitals filed for this visit.    Physical Exam

## 2021-10-25 NOTE — Unmapped (Signed)
Richard [name] 's Mycophenolate and Prograf shipment will be delayed as a result of the patient's insurance plan being in grace period (patient needs to pay insurance premium).     I have reached out to the patient  at [phone] and communicated the delay. We will wait for a call back from the patient to reschedule the delivery.  We have not confirmed the new delivery date.

## 2021-10-25 NOTE — Unmapped (Cosign Needed)
Nephrology ([facility]) History & Physical    Assessment & Plan:   Richard [name] is a 56 y.o. male with presentation complicated by history of morbid obesity, former tobacco use, obstructive sleep apnea, hypertension, T2DM, NASH cirrhosis s/p liver transplant (2013) on tacrolimus, CAD s/p NSTEMI, and HFpEF (grade 2 diastolic dysfunction) who presents with shortness of breath and chest tightness.     Principal Problem:    Dyspnea  Active Problems:    Hypertension    Type 2 diabetes mellitus with hyperglycemia, without long-term current use of insulin (CMS-HCC)    Liver replaced by transplant (CMS-HCC)    Morbid obesity with BMI of 45.0-49.9, adult (CMS-HCC)    Obstructive sleep apnea syndrome    CKD (chronic kidney disease) stage 4, GFR 15-29 ml/min (CMS-HCC)    Abnormal transaminases    Elevated troponin  Resolved Problems:    * No resolved hospital problems. *      Dyspnea - elevated troponin likely type II MI I CAD     Presented with 3 to 4 days of dyspnea and chest tightness, which are worsened with lying flat and movement.  Initial presentation with troponin elevated to 309 and bilateral lower extremity edema. EKG with sinus rhythm and first-degree heart block, and no ST segment deviations.  Chest x-ray with cardiomegaly and mild pulmonary edema.  Etiology of dyspnea is most likely volume overload from CHF exacerbation, we are also concerned for flash pulmonary edema.   Patient received his home dose of Lasix 40 mg this morning, and an additional 80 mg IV in the emergency department this afternoon.  He has had significant urine output throughout the day.   -Trended troponin with peak of 309  -Monitor renal function  -Strict I's and O's  - continue home ASA 81mg , imdur 30mg  daily  -Redose Lasix in the morning    HTN  Patient presented with hypertension to 180s/90s.  In the setting of his dyspnea, we are concerned for flash pulmonary edema.  Will be important to maintain adequate blood pressure control throughout this admission.  Patient recently underwent adjustment in his antihypertensive regimen with the addition of spironolactone and the discontinuation of hydralazine 25 mg every 6hrs.  -Continue current home regimen of amlodipine and spironolactone  -Hydralazine 10 mg IV as needed   ??  Hx liver transplant 2/2 NASH cirrhosis  Transaminases within normal limits upon presentation today.  Synthetic function intact. Reports taking immunosuppressants as directed, denies abdominal symptoms.   - continue tacrolimus 0.5mg  BID, cellcept 250mg  BID  - Trend hepatic function panel  - Follow up tac trough   ??  ??  CKD  Cr elevated to 3.64 on admission, consistent with recent baseline of approximately 3.5.  - Trend Cr, minimize nephrotoxins  ??  DM  Home meds: insulin aspart 7 units 3 times daily as well as [place] 12 units daily.  Most recent A1c is 8.3.  - start with SSI only    Checklist:  Diet: Diabetic Diet  DVT PPx: Not Indicated - Padua Score <4  Electrolytes: Replete Potassium to >/=4 and Magnesium to >/=2  Code Status: Full Code    Chief Concern:   Dyspnea with chest tightness and elevated troponin    Subjective:   Richard [name] is a 56 y.o. male with presentation complicated by history of morbid obesity, former tobacco use, obstructive sleep apnea, hypertension, T2DM, NASH cirrhosis s/p liver transplant (2013) on tacrolimus, CAD s/p NSTEMI, and HFpEF (grade 2 diastolic dysfunction) who  presents with shortness of breath and chest tightness.      HPI:  Richard [name] came into the emergency department after having progressive worsening of shortness of breath in the last 3 to 4 days.  He notes that it is exacerbated with movement, as well as lying flat.  For the last 3 days he has had to sleep propped up into almost a sitting position.  No infectious symptoms. Has been compliant with immunosuppression.  He did have recent change in his antihypertensive medication.  He notes that a few weeks ago his nephrologist discontinued his hydralazine 4 times a day and started him on spironolactone.  He has had no recent change in his Lasix dosage. He notes that he took Lasix this morning.  No recent change in urine output.     He endorses chest tightness, but no pain.  No pain in shoulder or back.  He does not note lower extremity swelling.  However appears to have 1+ edema on physical exam bilaterally. No abdominal pain, no nausea, vomiting, or diarrhea.     Designated Environmental health practitioner:  Richard [name] currently has decisional capacity for healthcare decision-making and is able to designate a surrogate healthcare decision maker. Richard [name] designated healthcare decision maker(s) is/are Richard [name] (the patient's adult sibling) as denoted by stated patient preference.    Objective:   Physical Exam:  Temp:  [36.8 ??C (98.2 ??F)] 36.8 ??C (98.2 ??F)  Heart Rate:  [68-97] 89  SpO2 Pulse:  [44-81] 81  Resp:  [17-38] 38  BP: (163-195)/(100-119) 175/100  SpO2:  [91 %-98 %] 94 %    Gen: NAD, converses appropriately  Eyes: Sclera anicteric, EOMI grossly normal   HENT: atraumatic, normocephalic  Neck: trachea midline  Heart: RRR  Lungs: CTAB, no crackles or wheezes  Abdomen: soft, NTND  Extremities: 1+ edema of lower extremities bilaterally  Neuro: CN II-XI grossly intact, no focal deficits.  Skin:  No rashes, lesions on clothed exam  Psych: Alert, oriented

## 2021-10-26 LAB — CBC
HEMATOCRIT: 34.3 % — ABNORMAL LOW (ref 39.0–48.0)
HEMOGLOBIN: 11.3 g/dL — ABNORMAL LOW (ref 12.9–16.5)
MEAN CORPUSCULAR HEMOGLOBIN CONC: 33 g/dL (ref 32.0–36.0)
MEAN CORPUSCULAR HEMOGLOBIN: 27.3 pg (ref 25.9–32.4)
MEAN CORPUSCULAR VOLUME: 82.6 fL (ref 77.6–95.7)
MEAN PLATELET VOLUME: 8.2 fL (ref 6.8–10.7)
PLATELET COUNT: 255 10*9/L (ref 150–450)
RED BLOOD CELL COUNT: 4.15 10*12/L — ABNORMAL LOW (ref 4.26–5.60)
RED CELL DISTRIBUTION WIDTH: 13.9 % (ref 12.2–15.2)
WBC ADJUSTED: 7.3 10*9/L (ref 3.6–11.2)

## 2021-10-26 LAB — BASIC METABOLIC PANEL
ANION GAP: 9 mmol/L (ref 5–14)
BLOOD UREA NITROGEN: 49 mg/dL — ABNORMAL HIGH (ref 9–23)
BUN / CREAT RATIO: 13
CALCIUM: 8.8 mg/dL (ref 8.7–10.4)
CHLORIDE: 107 mmol/L (ref 98–107)
CO2: 25 mmol/L (ref 20.0–31.0)
CREATININE: 3.63 mg/dL — ABNORMAL HIGH
EGFR CKD-EPI (2021) MALE: 19 mL/min/{1.73_m2} — ABNORMAL LOW (ref >=60)
GLUCOSE RANDOM: 109 mg/dL (ref 70–179)
POTASSIUM: 3.6 mmol/L (ref 3.4–4.8)
SODIUM: 141 mmol/L (ref 135–145)

## 2021-10-26 LAB — TACROLIMUS LEVEL, TROUGH: TACROLIMUS, TROUGH: 2.1 ng/mL — ABNORMAL LOW (ref 5.0–15.0)

## 2021-10-26 MED ORDER — FUROSEMIDE 20 MG TABLET
ORAL_TABLET | Freq: Every day | ORAL | 0 refills | 30 days | Status: CP
Start: 2021-10-26 — End: 2021-11-25
  Filled 2021-10-26: qty 60, 30d supply, fill #0

## 2021-10-26 MED ADMIN — magnesium oxide (MAG-OX) tablet 400 mg: 400 mg | ORAL | @ 13:00:00 | Stop: 2021-10-26

## 2021-10-26 MED ADMIN — furosemide (LASIX) injection 40 mg: 40 mg | INTRAVENOUS | @ 18:00:00 | Stop: 2021-10-26

## 2021-10-26 MED ADMIN — acetaminophen (TYLENOL) tablet 500 mg: 500 mg | ORAL | @ 10:00:00 | Stop: 2021-10-26

## 2021-10-26 MED ADMIN — spironolactone (ALDACTONE) tablet 25 mg: 25 mg | ORAL | @ 13:00:00 | Stop: 2021-10-26

## 2021-10-26 MED ADMIN — guaiFENesin (ROBITUSSIN) oral syrup: 200 mg | ORAL | @ 02:00:00

## 2021-10-26 MED ADMIN — mycophenolate (CELLCEPT) capsule 250 mg: 250 mg | ORAL | @ 13:00:00 | Stop: 2021-10-26

## 2021-10-26 MED ADMIN — isosorbide mononitrate (IMDUR) 24 hr tablet 60 mg: 60 mg | ORAL | @ 13:00:00 | Stop: 2021-10-26

## 2021-10-26 MED ADMIN — aspirin chewable tablet 81 mg: 81 mg | ORAL | @ 13:00:00 | Stop: 2021-10-26

## 2021-10-26 MED ADMIN — tacrolimus (PROGRAF) capsule 0.5 mg: .5 mg | ORAL | @ 13:00:00 | Stop: 2021-10-26

## 2021-10-26 MED ADMIN — mycophenolate (CELLCEPT) capsule 250 mg: 250 mg | ORAL | @ 01:00:00

## 2021-10-26 MED ADMIN — amLODIPine (NORVASC) tablet 5 mg: 5 mg | ORAL | @ 13:00:00 | Stop: 2021-10-26

## 2021-10-26 MED ADMIN — tacrolimus (PROGRAF) capsule 0.5 mg: .5 mg | ORAL | @ 01:00:00

## 2021-10-26 NOTE — Unmapped (Signed)
Pt is alert and oriented x4, calm and cooperative. He is self ambulatory and independent with care. He has tolerated meals and meds well today. Discharge instructions reviewed with pt and pt verbalized complete understanding. Pt requested to leave ambulatory and did so with all belongings in hand. He stated that his ride was waiting in the lobby. [facility] gave pt his discharge medications prior to leaving.

## 2021-10-26 NOTE — Unmapped (Signed)
Pt is alert and oriented x4, calm and cooperative. He is tolerating meals and meds well. He is independent with care. He reports some diminished urine and has received scheduled IV lasix today. Pt is on strict I&Os and daily weights. BG checked ACHS and treated per MD order. Will continue to monitor pt.    Problem: Adult Inpatient Plan of Care  Goal: Plan of Care Review  Outcome: Progressing  Goal: Patient-Specific Goal (Individualized)  Outcome: Progressing  Goal: Absence of Hospital-Acquired Illness or Injury  Outcome: Progressing  Intervention: Identify and Manage Fall Risk  Recent Flowsheet Documentation  Taken 10/25/2021 0800 by Louanne Skye, RN  Safety Interventions:   lighting adjusted for tasks/safety   low bed   nonskid shoes/slippers when out of bed  Intervention: Prevent Skin Injury  Recent Flowsheet Documentation  Taken 10/25/2021 0800 by Louanne Skye, RN  Skin Protection:   adhesive use limited   tubing/devices free from skin contact  Intervention: Prevent and Manage VTE (Venous Thromboembolism) Risk  Recent Flowsheet Documentation  Taken 10/25/2021 0800 by Louanne Skye, RN  Activity Management: up ad lib  VTE Prevention/Management: ambulation promoted  Goal: Optimal Comfort and Wellbeing  Outcome: Progressing  Goal: Readiness for Transition of Care  Outcome: Progressing  Goal: Rounds/Family Conference  Outcome: Progressing

## 2021-10-26 NOTE — Unmapped (Signed)
Last 5 Recorded Weights    10/26/21 1110   Weight: (!) 119.4 kg (263 lb 4.8 oz)

## 2021-10-27 DIAGNOSIS — Z09 Encounter for follow-up examination after completed treatment for conditions other than malignant neoplasm: Principal | ICD-10-CM

## 2021-10-27 MED ORDER — VALSARTAN 80 MG TABLET
ORAL_TABLET | Freq: Every day | ORAL | 0 refills | 30 days | Status: CP
Start: 2021-10-27 — End: 2021-11-26
  Filled 2021-10-26: qty 30, 30d supply, fill #0

## 2021-10-27 MED FILL — PROGRAF 0.5 MG CAPSULE: ORAL | 30 days supply | Qty: 60 | Fill #3

## 2021-10-27 MED FILL — MYCOPHENOLATE MOFETIL 250 MG CAPSULE: ORAL | 30 days supply | Qty: 60 | Fill #3

## 2021-10-27 NOTE — Unmapped (Signed)
Richard [name] 's Mycophenolate and Prograf shipment will be sent out  as a result of the patient is no longer in their grace period.     I have reached out to the patient  at [phone] and communicated the delivery change. We will reschedule the medication for the delivery date that the patient agreed upon.  We have confirmed the delivery date as 10/28/2021, via ups.

## 2021-10-27 NOTE — Unmapped (Signed)
Physician Discharge Summary [facility]  1 [facility] OBSERVATION [facility]  [address]  [place] [address]  Dept: [phone]  Loc: [phone]     Identifying Information:   Richard [name]  [birth year]  [id number]    Primary Care Physician: Mel Almond, MD     Code Status: Full Code    Admit Date: 10/24/2021    Discharge Date: 10/26/2021     Discharge To: Home    Discharge Service: [facility] - Nephrology Floor Team (MEDB)     Discharge Attending Physician: Clelia Schaumann, MD    Discharge Diagnoses:   Principal Problem:    Dyspnea POA: Yes  Active Problems:    Hypertension POA: Yes    Type 2 diabetes mellitus with hyperglycemia, without long-term current use of insulin (CMS-HCC) POA: Yes    Liver replaced by transplant (CMS-HCC) POA: Not Applicable    Morbid obesity with BMI of 45.0-49.9, adult (CMS-HCC) POA: Not Applicable    Obstructive sleep apnea syndrome POA: Yes    CKD (chronic kidney disease) stage 4, GFR 15-29 ml/min (CMS-HCC) POA: Yes    Elevated troponin POA: Yes  Resolved Problems:    * No resolved hospital problems. [facility] Course:   ??  Dyspnea??-??elevated troponin likely type II MI??-resolved - HFpEF (grade 2 diastolic dysfunction)   Patient presented with dyspnea without increased O2 requirement. Additionally he had elevated Troponin level on admission with a peak of 309.  Etiology of dyspnea suspected to be due to volume overload from CHF exacerbation and flash pulmonary edema. CXR 5/01 consistent with pulmonary edema. He was given aggressive diuresis with IV lasix 5/1-5/3 and dyspnea was improving upon discharge. We further addressed hypertension as below. ??Patient's weight upon discharge was 263lbs (5/3). He was given instructions to weigh himself daily at home and double his dose of lasix if weight increases >1lb in 24hrs.     ??HTN  Patient presented with hypertension to 180s/90s. ??In the setting of his dyspnea, we are concerned for flash pulmonary edema.  He was given as needed hydralazine for systolic BP> 200. We continued his spironolactone and restarted amlodipine. We also initiated valsartan and gave increased doses of lasix as above. He has close follow-up with PCP to further titrate anti-hypertensive regimen.  ??  Hx liver transplant 2/2 NASH cirrhosis  Transaminases within normal limits upon presentation????Synthetic function intact.   - continue tacrolimus 0.5mg  BID, cellcept 250mg  BID  ??  CKD  Cr elevated to 3.64??on admission, consistent with recent baseline??of approximately 3.5.  Renal function remained stable with increased diuresis.  ??  Hypokalemia   Likely due to increased dosage of Lasix.  We repleted as indicated. Recommend recheck at follow-up appointment with PCP.   ??  DM  Home meds:??insulin aspart 7 units 3 times daily??as well as [place] 12 units daily. ??Most recent??A1c is 8.3. Patient placed on sliding scale insulin during this admission. Discharged back on home regimen.    Outpatient Provider Follow Up Issues:   [ ]  Follow-up BP control and renal function and titrate anti-hypertensives accordingly  [  ] Follow-up potassium levels on current regimen of lasix and spironolactone    Touchbase with Outpatient Provider:  Warm Handoff: Completed on 10/26/21 by Bernita Raisin  (Intern) via Epic Secure Chat    Procedures:  None  ______________________________________________________________________  Discharge Medications:      Your Medication List      START taking these medications    valsartan 80 MG tablet  Commonly known as: DIOVAN  Take 1 tablet (80 mg total) by mouth daily.  Start taking on: Oct 27, 2021        CONTINUE taking these medications    amLODIPine 5 MG tablet  Commonly known as: NORVASC  Take 1 tablet (5 mg total) by mouth daily.     BAQSIMI 3 mg/actuation Spry  Generic drug: glucagon  Use 1 spray into the left nostril once as needed for up to 1 dose.     CONTOUR NEXT EZ METER Misc  Generic drug: blood-glucose meter  USE AS DIRECTED     CONTOUR NEXT TEST STRIPS Strp  Generic drug: blood sugar diagnostic  Use to check blood sugar as directed with insulin 4 times a day & for symptoms of high or low blood sugar.     DEXCOM G6 RECEIVER Misc  Generic drug: blood-glucose meter,continuous  1 each by Miscellaneous route in the morning. Dispense 1 receiver annually.  Sent to [facility]     DEXCOM G6 SENSOR Devi  Generic drug: blood-glucose sensor  Use as directed to monitor blood glucose     DEXCOM G6 SENSOR Devi  Generic drug: blood-glucose sensor  1 each by Miscellaneous route in the morning. [facility].     DEXCOM G6 TRANSMITTER Devi  Generic drug: blood-glucose transmitter  Use as directed to monitor blood glucose     DEXCOM G6 TRANSMITTER Devi  Generic drug: blood-glucose transmitter  1 each by Miscellaneous route Every three (3) months. [facility] pharmacy     FIASP FLEXTOUCH U-100 INSULIN 100 unit/mL (3 mL) Inpn  Generic drug: insulin aspart (niacinamide)  Inject 7 Units under the skin Three (3) times a day.     furosemide 20 MG tablet  Commonly known as: LASIX  Take 2 tablets (40 mg total) by mouth daily before breakfast.     isosorbide mononitrate 30 MG 24 hr tablet  Commonly known as: IMDUR  Take 2 tablets (60 mg total) by mouth daily.     magnesium oxide 400 mg (241.3 mg elemental) tablet  Commonly known as: MAG-OX  Take 1 tablet (400 mg total) by mouth.     MICROLET LANCET Misc  Generic drug: lancets  Use to check blood sugar as directed with insulin 4 times a day & for symptoms of high or low blood sugar.     multivitamin per tablet  Commonly known as: TAB-A-VITE/THERAGRAN  Take 1 tablet by mouth daily.     mycophenolate 250 mg capsule  Commonly known as: CELLCEPT  Take 1 capsule (250 mg total) by mouth Two (2) times a day.     nitroglycerin 0.4 MG SL tablet  Commonly known as: NITROSTAT  Place 1 tablet (0.4 mg total) under the tongue every five (5) minutes as needed for chest pain. Maximum of 3 doses in 15 minutes.     PROGRAF 0.5 MG capsule  Generic drug: tacrolimus  Take 1 capsule (0.5 mg total) by mouth two (2) times a day.     semaglutide 0.25 mg or 0.5 mg(2 mg/1.5 mL) Pnij injection  Commonly known as: OZEMPIC  Inject 0.25 mg under the skin every seven (7) days for 28 days, THEN 0.5 mg every seven (7) days.  Start taking on: October 17, 2021     spironolactone 25 MG tablet  Commonly known as: ALDACTONE  Take 1 tablet (25 mg total) by mouth daily.     terbinafine HCL 1 % cream  Commonly known as: LamISIL  Apply to  left big toenail and between left smallest toe and the toe adjacent to it (between 4th and 5th toes) twice per day     TRESIBA FLEXTOUCH U-100 100 unit/mL (3 mL) Inpn  Generic drug: insulin degludec  Inject 0.12 mL (12 Units total) under the skin daily.     triamcinolone 0.1 % cream  Commonly known as: KENALOG  Apply topically Two (2) times a day. Apply to itchy rash until skin is smooth, then stop. Restart as needed     TRUEPLUS PEN NEEDLE 32 gauge x 5/32 (4 mm) Ndle  Generic drug: pen needle, diabetic  Use with insulin up to 4 times a day as needed.     VITAMIN D3 ORAL  Take 2,000 Units by mouth daily.            Allergies:  Insulin asp prt-insulin aspart and Insulin aspart  ______________________________________________________________________  Pending Test Results:      Most Recent Labs:  All lab results last 24 hours -   Recent Results (from the past 24 hour(s))   POCT Glucose    Collection Time: 10/25/21  8:49 PM   Result Value Ref Range    Glucose, POC 125 70 - 179 mg/dL   Basic metabolic panel    Collection Time: 10/26/21  7:08 AM   Result Value Ref Range    Sodium 141 135 - 145 mmol/L    Potassium 3.6 3.4 - 4.8 mmol/L    Chloride 107 98 - 107 mmol/L    CO2 25.0 20.0 - 31.0 mmol/L    Anion Gap 9 5 - 14 mmol/L    BUN 49 (H) 9 - 23 mg/dL    Creatinine 1.61 (H) 0.60 - 1.10 mg/dL    BUN/Creatinine Ratio 13     eGFR CKD-EPI (2021) Male 19 (L) >=60 mL/min/1.53m2    Glucose 109 70 - 179 mg/dL    Calcium 8.8 8.7 - 09.6 mg/dL   CBC    Collection Time: 10/26/21  7:08 AM   Result Value Ref Range    WBC 7.3 3.6 - 11.2 10*9/L    RBC 4.15 (L) 4.26 - 5.60 10*12/L    HGB 11.3 (L) 12.9 - 16.5 g/dL    HCT 04.5 (L) 40.9 - 48.0 %    MCV 82.6 77.6 - 95.7 fL    MCH 27.3 25.9 - 32.4 pg    MCHC 33.0 32.0 - 36.0 g/dL    RDW 81.1 91.4 - 78.2 %    MPV 8.2 6.8 - 10.7 fL    Platelet 255 150 - 450 10*9/L   Tacrolimus Level, Trough    Collection Time: 10/26/21  7:08 AM   Result Value Ref Range    Tacrolimus, Trough 2.1 (L) 5.0 - 15.0 ng/mL   POCT Glucose    Collection Time: 10/26/21  7:41 AM   Result Value Ref Range    Glucose, POC 116 70 - 179 mg/dL   POCT Glucose    Collection Time: 10/26/21 11:10 AM   Result Value Ref Range    Glucose, POC 138 70 - 179 mg/dL       Relevant Studies/Radiology:  ECG 12 Lead    Result Date: 10/24/2021  SINUS RHYTHM WITH 1ST DEGREE AV BLOCK WITH PREMATURE ATRIAL BEATS RIGHT BUNDLE BRANCH BLOCK LEFT ANTERIOR FASCICULAR BLOCK BIFASCICULAR BLOCK ABNORMAL ECG WHEN COMPARED WITH ECG OF 29-Aug-2021 06:42, PREMATURE ATRIAL BEATS ARE NOW PRESENT Confirmed by Rose-jones, Lisa ([id number]) on 10/24/2021 12:07:54 PM    XR Chest  Portable    Result Date: 10/24/2021  EXAM: XR CHEST PORTABLE DATE: 10/24/2021 12:53 PM ACCESSION: [id number] UN DICTATED: 10/24/2021 12:57 PM INTERPRETATION LOCATION: [facility] CLINICAL INDICATION: 56 years old Male with DYSPNEA  TECHNIQUE: Single View AP Chest Radiograph. COMPARISON: 29 August 2021 FINDINGS: Low lung volumes with new mild perihilar hazy opacifications. No pleural effusion or pneumothorax. Borderline enlarged cardiac silhouette.     Borderline cardiomegaly with probable mild pulmonary edema.    ______________________________________________________________________  Discharge Instructions:   Activity Instructions     Activity as tolerated                     Follow Up instructions and Outpatient Referrals     Call MD for:  difficulty breathing, headache or visual disturbances      Call MD for:  persistent nausea or vomiting      Call MD for:  severe uncontrolled pain      Call MD for:  temperature >38.5 Celsius      Discharge instructions          Appointments which have been scheduled for you    Nov 03, 2021  9:00 AM  (Arrive by 8:45 AM)  RETURN CONTINUITY with Mel Almond, MD  [facility] INTERNAL MEDICINE [place] [facility]) [address]  [place] [address]  [phone]           ______________________________________________________________________  Discharge Day Services:  BP 119/83  - Pulse 70  - Temp 36.5 ??C (97.7 ??F) (Oral)  - Resp 17  - Wt (!) 119.4 kg (263 lb 4.8 oz)  - SpO2 95%  - BMI 37.78 kg/m??     Pt seen on the day of discharge and determined appropriate for discharge.    Condition at Discharge: fair    Length of Discharge: I spent greater than 30 mins in the discharge of this patient.

## 2021-11-03 ENCOUNTER — Ambulatory Visit: Admit: 2021-11-03 | Payer: PRIVATE HEALTH INSURANCE

## 2021-11-03 MED ORDER — ASPIRIN 81 MG CHEWABLE TABLET
ORAL_TABLET | Freq: Every day | ORAL | 0 refills | 90 days
Start: 2021-11-03 — End: 2022-02-01

## 2021-11-03 MED ORDER — EMPAGLIFLOZIN 10 MG TABLET
ORAL_TABLET | Freq: Every day | ORAL | 3 refills | 90 days
Start: 2021-11-03 — End: ?

## 2021-11-03 MED ORDER — ATORVASTATIN 40 MG TABLET
ORAL_TABLET | Freq: Every day | ORAL | 3 refills | 90 days
Start: 2021-11-03 — End: 2022-11-03

## 2021-11-03 NOTE — Unmapped (Incomplete)
Start taking Aspirin 81 mg daily for heart attack prevention   Start taking Atorvastatin 40 mg daily for heart attack prevention   Start taking empagliflozin aka Jardiance 10 mg daily to help treat your CKD, Heart failure (HF) and diabetes  Labs today

## 2021-11-03 NOTE — Unmapped (Unsigned)
Internal Medicine Clinic Visit    Reason for visit: hospital follow up - return visit #1    A/P:     {TIP - HCC- RAFF Pilot- Clinical Documentation Specialist Recommendations-  [ ]  See 10/14/21 w/ Zeitler for rec's on HTN/CKD     This text will self delete upon signing note:[id number]}     No diagnosis found.    Problem List          Cardiovascular and Mediastinum    Hypertension (Chronic)     Amlodipine 5, furosemide 40, Spiro 25, valsart 80         (HFpEF) heart failure with preserved ejection fraction (CMS-HCC) - Primary     Hospitalization requiring IV diuresis, no O2 requirement, discharged 10/26/2021            Endocrine    Type 2 diabetes mellitus with hyperglycemia, without long-term current use of insulin (CMS-HCC) (Chronic)     insulin aspart 7 units 3 times daily as well as [place] 12 units daily.  Most recent A1c is 8.3               No follow-ups on file.    Staffed with Dr. ***, {seen/discussed:75519}    __________________________________________________________    HPI:    Previous instructions in my note 3/27:   Will work on diet and exercise goals in note: walking more w/ goal of 30 min 2-3x per week  Will adjust mealtime insulin to 7 units TIDAC   Will receive dexcom by mail   Increase Lasix to 40 mg daily  Decrease amlodipine to 5 mg daily   Follow up for blood pressure check in 5 weeks  __________________________________________________________        Medications:  Reviewed in EPIC  __________________________________________________________    Physical Exam:   Vital Signs:  There were no vitals filed for this visit.       PTHomeBP    The patient???s Average Home Blood Pressure during the last two weeks is :   /   based on  readings            Gen: Well appearing, NAD  CV: RRR, no murmurs  Pulm: CTA bilaterally, no crackles or wheezes  Abd: Soft, NTND, normal BS.   Ext: No edema  ***    PHQ-9 Score:     GAD-7 Score:       Medication adherence and barriers to the treatment plan have been addressed. Opportunities to optimize healthy behaviors have been discussed. Patient / caregiver voiced understanding.

## 2021-11-15 NOTE — Unmapped (Signed)
Financially cleared to proceed with evaluation.     Verified insurance in [place]; [facility] [employer] Silver Preferred plan active coverage effective 06/26/2021.  No prior approval required and pharmacy coverage with [employer] [phone].  Fax clinical to [phone] when ready for listing.

## 2021-11-16 NOTE — Unmapped (Signed)
Received a referral for transplant for this patient.      Coordinator: Aundra Millet  Patient is being Re-Referred: no    56 y.o. 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

## 2021-11-24 NOTE — Unmapped (Signed)
[facility] Refill Coordination Note    Specialty Medication(s) to be Shipped:   Transplant: mycophenolate mofetil 250mg  and Prograf 0.5mg     Other medication(s) to be shipped: No additional medications requested for fill at this time     Richard [name], DOB: 02/28/1966  Phone: [phone] (home)       All above HIPAA information was verified with patient.     Was a Nurse, learning disability used for this call? No    Completed refill call assessment today to schedule patient's medication shipment from the [facility] [phone]).  All relevant notes have been reviewed.     Specialty medication(s) and dose(s) confirmed: Regimen is correct and unchanged.   Changes to medications: Allah reports no changes at this time.  Changes to insurance: No  New side effects reported not previously addressed with a pharmacist or physician: None reported  Questions for the pharmacist: No    Confirmed patient received a Conservation officer, historic buildings and a Surveyor, mining with first shipment. The patient will receive a drug information handout for each medication shipped and additional FDA Medication Guides as required.       DISEASE/MEDICATION-SPECIFIC INFORMATION        N/A    SPECIALTY MEDICATION ADHERENCE     Medication Adherence    Patient reported X missed doses in the last month: 0  Specialty Medication: Mycophenolate 250mg   Patient is on additional specialty medications: Yes  Additional Specialty Medications: Prograf 0.5mg   Patient Reported Additional Medication X Missed Doses in the Last Month: 0  Patient is on more than two specialty medications: No  Adherence tools used: patient uses a pill box to manage medications       Were doses missed due to medication being on hold? No    Mycophenolate 250 mg: 7 days of medicine on hand   Prograf 0.5 mg: 7 days of medicine on hand     REFERRAL TO PHARMACIST     Referral to the pharmacist: Not needed      [facility]     Shipping address confirmed in Epic. Delivery Scheduled: Yes, Expected medication delivery date: 11/28/21.     Medication will be delivered via UPS to the prescription address in Epic WAM.    Richard [name]   [facility] Specialty Technician

## 2021-11-25 MED FILL — PROGRAF 0.5 MG CAPSULE: ORAL | 30 days supply | Qty: 60 | Fill #4

## 2021-11-25 MED FILL — MYCOPHENOLATE MOFETIL 250 MG CAPSULE: ORAL | 30 days supply | Qty: 60 | Fill #4

## 2021-12-17 NOTE — Unmapped (Signed)
Called patient to discuss scheduling orientation and related appointments. No answer, left vm. Attempt #1

## 2022-01-03 NOTE — Unmapped (Signed)
[facility] Refill Coordination Note    Specialty Medication(s) to be Shipped:   Transplant: mycophenolate mofetil 250mg  and Prograf 0.5mg     Other medication(s) to be shipped: No additional medications requested for fill at this time     Richard [name], DOB: 1966/03/28  Phone: [phone] (home)       All above HIPAA information was verified with patient.     Was a Nurse, learning disability used for this call? No    Completed refill call assessment today to schedule patient's medication shipment from the [facility] [phone]).  All relevant notes have been reviewed.     Specialty medication(s) and dose(s) confirmed: Regimen is correct and unchanged.   Changes to medications: Richard [name] reports no changes at this time.  Changes to insurance: No  New side effects reported not previously addressed with a pharmacist or physician: None reported  Questions for the pharmacist: No    Confirmed patient received a Conservation officer, historic buildings and a Surveyor, mining with first shipment. The patient will receive a drug information handout for each medication shipped and additional FDA Medication Guides as required.       DISEASE/MEDICATION-SPECIFIC INFORMATION        N/A    SPECIALTY MEDICATION ADHERENCE     Medication Adherence    Patient reported X missed doses in the last month: 0  Specialty Medication: mycophenolate 250 mg  Patient is on additional specialty medications: Yes  Additional Specialty Medications: Prograf 0.5 mg   Patient Reported Additional Medication X Missed Doses in the Last Month: 0  Adherence tools used: patient uses a pill box to manage medications              Were doses missed due to medication being on hold? No    Mycophenolate 250 mg: 4 days of medicine on hand   Prograf 0.5 mg: 4 days of medicine on hand        REFERRAL TO PHARMACIST     Referral to the pharmacist: Not needed      [facility]     Shipping address confirmed in Epic.     Delivery Scheduled: Yes, Expected medication delivery date: 01/05/22.     Medication will be delivered via UPS to the prescription address in Epic WAM.    [name]   [facility] Specialty Technician

## 2022-01-04 NOTE — Unmapped (Signed)
Richard [name] 's entire shipment will be delayed as a result of the patient's insurance plan being in grace period (patient needs to pay insurance premium).     I have reached out to the patient  at [phone] and communicated the delay. We will wait for a call back from the patient to reschedule the delivery.  We have not confirmed the new delivery date.

## 2022-01-05 MED FILL — MYCOPHENOLATE MOFETIL 250 MG CAPSULE: ORAL | 30 days supply | Qty: 60 | Fill #5

## 2022-01-05 MED FILL — PROGRAF 0.5 MG CAPSULE: ORAL | 30 days supply | Qty: 60 | Fill #5

## 2022-01-05 NOTE — Unmapped (Signed)
Richard [name] 's entire shipment will be sent out  as a result of the patient is no longer in their grace period.     I have reached out to the patient  at [phone] and communicated the delivery change. We will reschedule the medication for the delivery date that the patient agreed upon.  We have confirmed the delivery date as 01/06/2022, via ups.

## 2022-01-16 NOTE — Unmapped (Signed)
Complex Case Management  SUMMARY NOTE    High Risk Care Coordinator  spoke with patient and verified correct patient using two identifiers today to introduce the Complex Case Management program.     Discussed the following:  Program Services    Program status: Patient stated he would call back if interested

## 2022-01-19 NOTE — Unmapped (Unsigned)
Referring Provider: Mel Almond, MD     PCP:  Mel Almond, MD    01/19/2022    Chief Complaint: CKD4    Assessment/Plan:  RichardMarkcus L [name] is a 56 y.o. patient with a PMH of NASH s/p OLT in 2013, T2DM and HTN who is being seen in follow up of CKD    CKD Stage G4A3: Secondary to biopsy proven CNI nephropathy (in 2020 bx). This is compounded by his hypertension and inadequate diabetes control, which has resulted in significant albuminuria.   - Repeat RFP today  - RASi: Spironolactone 25 mg daily  - SGLT2i: Not currently on, but pending eGFR may be of benefit  - GLP1ra: Semaglutide 0.25 mg weeklky    Hypertension: Near goal at home but on an odd regimen which is neither convenient nor addresses CVD risk/albuminuria.  - Current: amlodipine 5 mg daily, furosemide 40 mg daily, hydralazine 25 QID, ImDUR 60 mg daily    KRT: We discussed that our goal is to maintain his current kidney function, but that he is likely to need KRT in the future based on his age, eGFR and albuminuria.  - Transplant: referred  - Modality: He was hesitant to even discuss this today based on his experiences in the hospital, but we did address this may be necessary in the future  - Access: as above  - Infection: Hep B antibody/antigen and hep C antibody negative 3/23    MBD: PTH 267 in 09/2021    Anemia: Hemoglobin 11.3 in 5/23    Patient will return to clinic in 3 months.   ~~~~~~~~~~~~~~~~~~~~~~~~~~~~~~~~~~~~~~~~~~~~~~~~~~~~~~~~~~~~~~~~~~~~~~~~~~~~~~~~~~~~~~~~~~~~~~~~    HPI:  Richard [name] is a 56 y.o. patient with a past medical history of NASH cirrhosis s/p OLT 2013), T2DM, HTN and nephrolithiasis who is seen in follow up of CKD.    Background: Prior to liver transplant, did not have any chronic kidney issues but did require dialysis in the perioperative period for presumed hepatorenal syndrome. This improved following transplant and he was able to come off of dialysis, but has dealt with CKD since that time. He was previously followed previously by [facility]. In addition to CKD, he developed both HTN and DM following transplant. His DM control has been difficult and he says postprandial blood sugars are often going into the 300s. His hypertension is better controlled at home than it is here in clinic, typically in the 130s.     He has had one kidney stone since that time as well and he reports several UTIs (although I do not see any positive UCx in the chart). No one in his family has had any kidney problems, but his uncle did have a liver transplant secondary to NASH as well.     ROS:  All other systems negative unless otherwise noted    PAST MEDICAL HISTORY:  Past Medical History:   Diagnosis Date    COVID-19 07/18/2019    Diabetes mellitus (CMS-HCC)     Family history of malignant neoplasm of prostate 06/23/2015    Hepatic cirrhosis (CMS-HCC) 06/23/2015    Overview:  Secondary to NASH; followed by Dr. Yevonne Pax, GI.  Last Assessment & Plan:  Relevant Hx: Course: Daily Update: Today's Plan:     Hypertension     Liver cirrhosis secondary to NASH (nonalcoholic steatohepatitis) (CMS-HCC)     s/p liver transplant 2013     ALLERGIES  Insulin asp prt-insulin aspart and Insulin aspart  SOCIAL HISTORY  Social History  Socioeconomic History    Marital status: Married   Tobacco Use    Smoking status: Former    Smokeless tobacco: Never   Haematologist Use: Never used   Substance and Sexual Activity    Alcohol use: No    Drug use: No    Sexual activity: Yes     Partners: Female   Other Topics Concern    Do you use sunscreen? No    Tanning bed use? No    Are you easily burned? No    Excessive sun exposure? No    Blistering sunburns? 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 Living in [place] area currently.    FAMILY HISTORY  Family History   Problem Relation Age of Onset    Diabetes Father     Diabetes Paternal Grandfather     Melanoma Neg Hx     Basal cell carcinoma Neg Hx     Squamous cell carcinoma Neg Hx       MEDICATIONS:  Current Outpatient Medications   Medication Sig Dispense Refill    amLODIPine (NORVASC) 5 MG tablet Take 1 tablet (5 mg total) by mouth daily. 90 tablet 3    blood sugar diagnostic (CONTOUR NEXT TEST STRIPS) Strp Use to check blood sugar as directed with insulin 4 times a day & for symptoms of high or low blood sugar. 100 each 0    blood-glucose meter Misc USE AS DIRECTED 1 each 0    blood-glucose meter,continuous (DEXCOM G6 RECEIVER) Misc 1 each by Miscellaneous route in the morning. Dispense 1 receiver annually.  Sent to [facility]. 1 each 0    blood-glucose sensor (DEXCOM G6 SENSOR) Devi Use as directed to monitor blood glucose 3 each 0    blood-glucose sensor (DEXCOM G6 SENSOR) Devi 1 each by Miscellaneous route in the morning. [facility]. 9 each 3    blood-glucose transmitter (DEXCOM G6 TRANSMITTER) Devi Use as directed to monitor blood glucose 1 each 0    blood-glucose transmitter (DEXCOM G6 TRANSMITTER) Devi 1 each by Miscellaneous route Every three (3) months. [facility] pharmacy 3 each 4    CHOLECALCIFEROL, VITAMIN D3, (VITAMIN D3 ORAL) Take 2,000 Units by mouth daily.      furosemide (LASIX) 20 MG tablet Take 2 tablets (40 mg total) by mouth daily before breakfast. 60 tablet 0    glucagon (BAQSIMI) 3 mg/actuation Spry Use 1 spray into the left nostril once as needed for up to 1 dose. 2 each 0    insulin degludec (TRESIBA FLEXTOUCH U-100) 100 unit/mL (3 mL) InPn Inject 0.12 mL (12 Units total) under the skin daily. 15 mL 1    isosorbide mononitrate (IMDUR) 30 MG 24 hr tablet Take 2 tablets (60 mg total) by mouth daily. 60 tablet 0    lancets Misc Use to check blood sugar as directed with insulin 4 times a day & for symptoms of high or low  blood sugar. 100 each 0    magnesium oxide (MAG-OX) 400 mg (241.3 mg elemental magnesium) tablet Take 1 tablet (400 mg total) by mouth.      multivitamin (TAB-A-VITE/THERAGRAN) per tablet Take 1 tablet by mouth daily.      mycophenolate (CELLCEPT) 250 mg capsule Take 1 capsule (250 mg total) by mouth Two (2) times a day. 60 capsule 11    nitroglycerin (NITROSTAT) 0.4 MG SL tablet Place 1 tablet (0.4 mg total) under the tongue every five (5) minutes as needed for chest pain. Maximum of 3 doses in 15 minutes. 25 tablet 0    pen needle, diabetic 32 gauge x 5/32 (4 mm) Ndle Use with insulin up to 4 times a day as needed. 100 each 0    spironolactone (ALDACTONE) 25 MG tablet Take 1 tablet (25 mg total) by mouth daily. 90 tablet 3    tacrolimus (PROGRAF) 0.5 MG capsule Take 1 capsule (0.5 mg total) by mouth two (2) times a day. 180 capsule 3    terbinafine HCL (LAMISIL) 1 % cream Apply to left big toenail and between left smallest toe and the toe adjacent to it (between 4th and 5th toes) twice per day 12 g 11    triamcinolone (KENALOG) 0.1 % cream Apply topically Two (2) times a day. Apply to itchy rash until skin is smooth, then stop. Restart as needed 15 g 1    valsartan (DIOVAN) 80 MG tablet Take 1 tablet (80 mg total) by mouth daily. 30 tablet 0     No current facility-administered medications for this visit.     PHYSICAL EXAM:     There were no vitals filed for this visit.    Gen: alert, well appearing conversant without difficulty, NAD  Eyes: EOMI, sclera anicteric  Card: RRR, minimal LE edema  Skin: No rashes or lesions appreciated  Neuro: Strength and sensation grossly intact, no focal motor or sensory deficits appreciated    MEDICAL DECISION MAKING    Creatinine trend:  Creatinine   Date Value Ref Range Status   10/26/2021 3.63 (H) 0.60 - 1.10 mg/dL Final   16/03/9603 5.40 (H) 0.60 - 1.10 mg/dL Final   98/04/9146 8.29 (H) 0.60 - 1.10 mg/dL Final   56/21/3086 5.78 (H) 0.60 - 1.10 mg/dL Final   46/96/2952 8.41 (H) 0.60 - 1.10 mg/dL Final   32/44/0102 7.25 mg/dL Final   36/64/4034 7.42 (H) 0.70 - 1.30 mg/dL Final   59/56/3875 6.43 (H) 0.70 - 1.30 mg/dL Final   32/95/1884 1.66 (H) 0.70 - 1.30 mg/dL Final   12/24/1599 0.93  Final   03/10/2013 1.29  Final         Urine Sediment: Not examined today    IMAGING STUDIES: Reviewed    Biopsy 06/04/2019:

## 2022-01-26 NOTE — Unmapped (Signed)
[facility] Refill Coordination Note    Specialty Medication(s) to be Shipped:   Transplant: mycophenolate mofetil 250mg  and Prograf 0.5mg     Other medication(s) to be shipped: No additional medications requested for fill at this time     Richard [name], DOB: 06-Sep-1965  Phone: [phone] (home)       All above HIPAA information was verified with patient.     Was a Nurse, learning disability used for this call? No    Completed refill call assessment today to schedule patient's medication shipment from the [facility] [phone]).  All relevant notes have been reviewed.     Specialty medication(s) and dose(s) confirmed: Regimen is correct and unchanged.     Changes to medications: Richard [name] reports no changes at this time.  Changes to insurance: No  New side effects reported not previously addressed with a pharmacist or physician: None reported  Questions for the pharmacist: No    Confirmed patient received a Conservation officer, historic buildings and a Surveyor, mining with first shipment. The patient will receive a drug information handout for each medication shipped and additional FDA Medication Guides as required.       DISEASE/MEDICATION-SPECIFIC INFORMATION        N/A    SPECIALTY MEDICATION ADHERENCE     Medication Adherence    Patient reported X missed doses in the last month: 0  Specialty Medication: mycophenolate 250 mg  Patient is on additional specialty medications: Yes  Additional Specialty Medications: Prograf 0.5 mg   Patient Reported Additional Medication X Missed Doses in the Last Month: 0  Patient is on more than two specialty medications: No  Adherence tools used: patient uses a pill box to manage medications              Were doses missed due to medication being on hold? No    mycophenolate 250 mg: 10 days of medicine on hand   prograf 0.5 mg: 10 days of medicine on hand     REFERRAL TO PHARMACIST     Referral to the pharmacist: Not needed      [facility]     Shipping address confirmed in Epic.     Delivery Scheduled: Yes, Expected medication delivery date: 02/01/22.     Medication will be delivered via UPS to the prescription address in Epic WAM.    [name]   [facility] Specialty Technician

## 2022-01-31 MED FILL — PROGRAF 0.5 MG CAPSULE: ORAL | 30 days supply | Qty: 60 | Fill #6

## 2022-01-31 MED FILL — MYCOPHENOLATE MOFETIL 250 MG CAPSULE: ORAL | 30 days supply | Qty: 60 | Fill #6

## 2022-02-24 NOTE — Unmapped (Addendum)
[facility] Clinical Assessment & Refill Coordination Note    Richard [name], DOB: 10/30/1965  Phone: [phone] (home)     All above HIPAA information was verified with patient.     Was a Nurse, learning disability used for this call? No    Specialty Medication(s):   Transplant: mycophenolate mofetil 250mg  and Prograf 0.5mg      Current Outpatient Medications   Medication Sig Dispense Refill   ??? amLODIPine (NORVASC) 5 MG tablet Take 1 tablet (5 mg total) by mouth daily. 90 tablet 3   ??? blood sugar diagnostic (CONTOUR NEXT TEST STRIPS) Strp Use to check blood sugar as directed with insulin 4 times a day & for symptoms of high or low blood sugar. 100 each 0   ??? blood-glucose meter Misc USE AS DIRECTED 1 each 0   ??? blood-glucose meter,continuous (DEXCOM G6 RECEIVER) Misc 1 each by Miscellaneous route in the morning. Dispense 1 receiver annually.  Sent to [facility] 1 each 0   ??? blood-glucose sensor (DEXCOM G6 SENSOR) Devi Use as directed to monitor blood glucose 3 each 0   ??? blood-glucose sensor (DEXCOM G6 SENSOR) Devi 1 each by Miscellaneous route in the morning. [facility]. 9 each 3   ??? blood-glucose transmitter (DEXCOM G6 TRANSMITTER) Devi Use as directed to monitor blood glucose 1 each 0   ??? blood-glucose transmitter (DEXCOM G6 TRANSMITTER) Devi 1 each by Miscellaneous route Every three (3) months. [facility] 3 each 4   ??? CHOLECALCIFEROL, VITAMIN D3, (VITAMIN D3 ORAL) Take 2,000 Units by mouth daily.     ??? furosemide (LASIX) 20 MG tablet Take 2 tablets (40 mg total) by mouth daily before breakfast. 60 tablet 0   ??? glucagon (BAQSIMI) 3 mg/actuation Spry Use 1 spray into the left nostril once as needed for up to 1 dose. 2 each 0   ??? insulin degludec (TRESIBA FLEXTOUCH U-100) 100 unit/mL (3 mL) InPn Inject 0.12 mL (12 Units total) under the skin daily. 15 mL 1   ??? isosorbide mononitrate (IMDUR) 30 MG 24 hr tablet Take 2 tablets (60 mg total) by mouth daily. 60 tablet 0   ??? lancets Misc Use to check blood sugar as directed with insulin 4 times a day & for symptoms of high or low blood sugar. 100 each 0   ??? magnesium oxide (MAG-OX) 400 mg (241.3 mg elemental magnesium) tablet Take 1 tablet (400 mg total) by mouth.     ??? multivitamin (TAB-A-VITE/THERAGRAN) per tablet Take 1 tablet by mouth daily.     ??? mycophenolate (CELLCEPT) 250 mg capsule Take 1 capsule (250 mg total) by mouth Two (2) times a day. 60 capsule 11   ??? nitroglycerin (NITROSTAT) 0.4 MG SL tablet Place 1 tablet (0.4 mg total) under the tongue every five (5) minutes as needed for chest pain. Maximum of 3 doses in 15 minutes. 25 tablet 0   ??? pen needle, diabetic 32 gauge x 5/32 (4 mm) Ndle Use with insulin up to 4 times a day as needed. 100 each 0   ??? spironolactone (ALDACTONE) 25 MG tablet Take 1 tablet (25 mg total) by mouth daily. 90 tablet 3   ??? tacrolimus (PROGRAF) 0.5 MG capsule Take 1 capsule (0.5 mg total) by mouth two (2) times a day. 180 capsule 3   ??? terbinafine HCL (LAMISIL) 1 % cream Apply to left big toenail and between left smallest toe and the toe adjacent to it (between 4th and 5th toes) twice per  day 12 g 11   ??? triamcinolone (KENALOG) 0.1 % cream Apply topically Two (2) times a day. Apply to itchy rash until skin is smooth, then stop. Restart as needed 15 g 1   ??? valsartan (DIOVAN) 80 MG tablet Take 1 tablet (80 mg total) by mouth daily. 30 tablet 0     No current facility-administered medications for this visit.        Changes to medications: Dejon reports no changes at this time.    Allergies   Allergen Reactions   ??? Insulin Asp Prt-Insulin Aspart      Other reaction(s): Other  Burning on injection and skin reaction.     ??? Insulin Aspart Other (See Comments)     Burning on injection and skin rash (itchy)       Changes to allergies: No    SPECIALTY MEDICATION ADHERENCE     Prograf 0.5mg   : 10 days of medicine on hand   Mycophenolate 250mg   : 10 days of medicine on hand       Medication Adherence    Patient reported X missed doses in the last month: 0  Specialty Medication: prograf 0.5mg   Patient is on additional specialty medications: Yes  Additional Specialty Medications: Mycophenolate 250mg   Patient Reported Additional Medication X Missed Doses in the Last Month: 0      Adherence tools used: patient uses a pill box to manage medications                  Specialty medication(s) dose(s) confirmed: Regimen is correct and unchanged.     Are there any concerns with adherence? No    Adherence counseling provided? Not needed    CLINICAL MANAGEMENT AND INTERVENTION      Clinical Benefit Assessment:    Do you feel the medicine is effective or helping your condition? Yes    Clinical Benefit counseling provided? Not needed    Adverse Effects Assessment:    Are you experiencing any side effects? No    Are you experiencing difficulty administering your medicine? No    Quality of Life Assessment:         How many days over the past month did your transplant  keep you from your normal activities? For example, brushing your teeth or getting up in the morning. 0    Have you discussed this with your provider? Not needed    Acute Infection Status:    Acute infections noted within Epic:  No active infections  Patient reported infection: None    Therapy Appropriateness:    Is therapy appropriate and patient progressing towards therapeutic goals? Yes, therapy is appropriate and should be continued    DISEASE/MEDICATION-SPECIFIC INFORMATION      N/A    PATIENT SPECIFIC NEEDS     - Does the patient have any physical, cognitive, or cultural barriers? No    - Is the patient high risk? Yes, patient is taking a REMS drug. Medication is dispensed in compliance with REMS program    - Does the patient require a Care Management Plan? No     SOCIAL DETERMINANTS OF HEALTH     At the [facility], we have learned that life circumstances - like trouble affording food, housing, utilities, or transportation can affect the health of many of our patients.   That is why we wanted to ask: are you currently experiencing any life circumstances that are negatively impacting your health and/or quality of life? No  Social Determinants of Health     Financial Resource Strain: Low Risk  (09/19/2021)    Overall Financial Resource Strain (CARDIA)    ??? Difficulty of Paying Living Expenses: Not hard at all   Internet Connectivity: Not on file   Food Insecurity: No Food Insecurity (09/19/2021)    Hunger Vital Sign    ??? Worried About Running Out of Food in the Last Year: Never true    ??? Ran Out of Food in the Last Year: Never true   Tobacco Use: Medium Risk (10/18/2021)    Patient History    ??? Smoking Tobacco Use: Former    ??? Smokeless Tobacco Use: Never    ??? Passive Exposure: Not on file   Housing/Utilities: Low Risk  (09/19/2021)    Housing/Utilities    ??? Within the past 12 months, have you ever stayed: outside, in a car, in a tent, in an overnight shelter, or temporarily in someone else's home (i.e. couch-surfing)?: No    ??? Are you worried about losing your housing?: No    ??? Within the past 12 months, have you been unable to get utilities (heat, electricity) when it was really needed?: No   Alcohol Use: Not At Risk (09/19/2021)    Alcohol Use    ??? How often do you have a drink containing alcohol?: Never    ??? How many drinks containing alcohol do you have on a typical day when you are drinking?: Not on file    ??? How often do you have 5 or more drinks on one occasion?: Not on file   Transportation Needs: No Transportation Needs (09/19/2021)    PRAPARE - Transportation    ??? Lack of Transportation (Medical): No    ??? Lack of Transportation (Non-Medical): No   Substance Use: Not on file   Health Literacy: Not on file   Physical Activity: Not on file   Interpersonal Safety: Not on file   Stress: Not on file   Intimate Partner Violence: Not At Risk (09/19/2021)    Humiliation, Afraid, Rape, and Kick questionnaire    ??? Fear of Current or Ex-Partner: No    ??? Emotionally Abused: No    ??? Physically Abused: No    ??? Sexually Abused: No   Depression: Not at risk (09/19/2021)    PHQ-2    ??? PHQ-2 Score: 0   Social Connections: Not on file       Would you be willing to receive help with any of the needs that you have identified today? Not applicable       SHIPPING     Specialty Medication(s) to be Shipped:   Transplant: mycophenolate mofetil 250mg  and Prograf 0.5mg     Other medication(s) to be shipped: No additional medications requested for fill at this time     Changes to insurance: No    Delivery Scheduled: Yes, Expected medication delivery date: 03/02/2022.     Medication will be delivered via UPS to the confirmed prescription address in [facility].   Patient gave new address today: [address] [place] [address]. This was entered as prescription address in wam and set up as delivery address for this shipment per patient request.    The patient will receive a drug information handout for each medication shipped and additional FDA Medication Guides as required.  Verified that patient has previously received a Conservation officer, historic buildings and a Surveyor, mining.    The patient or caregiver noted above participated in the  development of this care plan and knows that they can request review of or adjustments to the care plan at any time.      All of the patient's questions and concerns have been addressed.    [name]   [facility] Specialty Pharmacist

## 2022-03-01 NOTE — Unmapped (Signed)
Richard [name] 's entire shipment will be delayed as a result of the patient's insurance plan being in grace period (patient needs to pay insurance premium).     I have reached out to the patient  at [phone] and communicated the delay. We will call the patient back to reschedule the delivery upon resolution. We have not confirmed the new delivery date.

## 2022-03-02 DIAGNOSIS — I10 Essential (primary) hypertension: Principal | ICD-10-CM

## 2022-03-02 MED ORDER — FUROSEMIDE 20 MG TABLET
ORAL_TABLET | Freq: Every day | ORAL | 0 refills | 30 days
Start: 2022-03-02 — End: 2022-04-01

## 2022-03-03 MED ORDER — FUROSEMIDE 20 MG TABLET
ORAL_TABLET | Freq: Every day | ORAL | 6 refills | 30 days | Status: CP
Start: 2022-03-03 — End: 2022-09-01

## 2022-03-03 MED FILL — MYCOPHENOLATE MOFETIL 250 MG CAPSULE: ORAL | 30 days supply | Qty: 60 | Fill #7

## 2022-03-03 MED FILL — PROGRAF 0.5 MG CAPSULE: ORAL | 30 days supply | Qty: 60 | Fill #7

## 2022-03-03 NOTE — Unmapped (Signed)
Richard [name] 's entire shipment will be sent out  as a result of the patient is no longer in their grace period.     I have reached out to the patient  at [phone] and communicated the delivery change. We will reschedule the medication for the delivery date that the patient agreed upon.  We have confirmed the delivery date as 03/06/22, via ups.

## 2022-03-06 DIAGNOSIS — N186 End stage renal disease: Principal | ICD-10-CM

## 2022-03-06 DIAGNOSIS — Z01818 Encounter for other preprocedural examination: Principal | ICD-10-CM

## 2022-03-06 NOTE — Unmapped (Signed)
TRF

## 2022-03-08 NOTE — Unmapped (Signed)
1st attempt to r/s was able to get patient r/s while on the phone, pt mentioned his cg is spanish speaking only, pt mentioned he could translate for his cg, verified address w/ pt

## 2022-03-16 DIAGNOSIS — E119 Type 2 diabetes mellitus without complications: Principal | ICD-10-CM

## 2022-03-16 DIAGNOSIS — Z794 Long term (current) use of insulin: Principal | ICD-10-CM

## 2022-03-16 DIAGNOSIS — E669 Obesity, unspecified: Principal | ICD-10-CM

## 2022-03-16 DIAGNOSIS — I10 Essential (primary) hypertension: Principal | ICD-10-CM

## 2022-03-16 MED ORDER — OZEMPIC 0.25 MG OR 0.5 MG (2 MG/3 ML) SUBCUTANEOUS PEN INJECTOR
SUBCUTANEOUS | 0 refills | 56 days | Status: CP
Start: 2022-03-16 — End: 2022-03-16

## 2022-03-17 DIAGNOSIS — E119 Type 2 diabetes mellitus without complications: Principal | ICD-10-CM

## 2022-03-17 DIAGNOSIS — Z794 Long term (current) use of insulin: Principal | ICD-10-CM

## 2022-03-17 MED ORDER — TRULICITY 0.75 MG/0.5 ML SUBCUTANEOUS PEN INJECTOR
SUBCUTANEOUS | 0 refills | 28 days | Status: CP
Start: 2022-03-17 — End: 2022-04-08

## 2022-03-23 NOTE — Unmapped (Signed)
[facility] Refill Coordination Note    Specialty Medication(s) to be Shipped:   Transplant: mycophenolate mofetil 250mg  and Prograf 0.5mg     Other medication(s) to be shipped: No additional medications requested for fill at this time     Richard [name], DOB: [birth year]  Phone: [phone] (home)       All above HIPAA information was verified with patient.     Was a Nurse, learning disability used for this call? No    Completed refill call assessment today to schedule patient's medication shipment from the [facility] [phone]).  All relevant notes have been reviewed.     Specialty medication(s) and dose(s) confirmed: Regimen is correct and unchanged.   Changes to medications: Richard [name] reports no changes at this time.  Changes to insurance: No  New side effects reported not previously addressed with a pharmacist or physician: None reported  Questions for the pharmacist: No    Confirmed patient received a Conservation officer, historic buildings and a Surveyor, mining with first shipment. The patient will receive a drug information handout for each medication shipped and additional FDA Medication Guides as required.       DISEASE/MEDICATION-SPECIFIC INFORMATION        N/A    SPECIALTY MEDICATION ADHERENCE     Medication Adherence    Patient reported X missed doses in the last month: 0  Specialty Medication: mycophenolate 250 mg capsule (CELLCEPT)  Patient is on additional specialty medications: Yes  Additional Specialty Medications: PROGRAF 0.5 MG capsule (tacrolimus)  Patient Reported Additional Medication X Missed Doses in the Last Month: 0  Patient is on more than two specialty medications: No      Adherence tools used: patient uses a pill box to manage medications                          Were doses missed due to medication being on hold? No    PROGRAF 0.5 mg: unsure days of medicine on hand   mycophenolate 250 mg: unsure days of medicine on hand     REFERRAL TO PHARMACIST     Referral to the pharmacist: Not needed      [facility]     Shipping address confirmed in Epic.     Delivery Scheduled: Yes, Expected medication delivery date: 03/28/22.     Medication will be delivered via UPS to the prescription address in Epic WAM.    Richard [name]   [facility] Specialty Technician

## 2022-03-27 NOTE — Unmapped (Signed)
Richard [name] 's entire shipment will be delayed as a result of the medication is too soon to refill until 03/29/2022.     I have reached out to the patient  at [phone] and left a voicemail message.  We will wait for a call back from the patient to reschedule the delivery.  We have not confirmed the new delivery date.

## 2022-03-29 NOTE — Unmapped (Signed)
Richard [name] 's entire shipment will be delayed as a result of the medication is no longer refill too soon.      I have reached out to the patient  at [phone] and left a voicemail message.  We will wait for a call back from the patient to reschedule the delivery.  We have not confirmed the new delivery date.

## 2022-04-03 NOTE — Unmapped (Signed)
Richard [name] 's entire shipment will be sent out  as a result of the medication is no longer refill too soon.      I have reached out to the patient  at [phone] and communicated the delivery change. We will reschedule the medication for the delivery date that the patient agreed upon.  We have confirmed the delivery date as 04/04/2022, via ups.

## 2022-04-04 MED FILL — PROGRAF 0.5 MG CAPSULE: ORAL | 30 days supply | Qty: 60 | Fill #8

## 2022-04-04 MED FILL — MYCOPHENOLATE MOFETIL 250 MG CAPSULE: ORAL | 30 days supply | Qty: 60 | Fill #8

## 2022-04-10 DIAGNOSIS — E119 Type 2 diabetes mellitus without complications: Principal | ICD-10-CM

## 2022-04-10 DIAGNOSIS — Z794 Long term (current) use of insulin: Principal | ICD-10-CM

## 2022-04-10 MED ORDER — INSULIN ASPART (U-100) 100 UNIT/ML (3 ML) SUBCUTANEOUS PEN
Freq: Three times a day (TID) | SUBCUTANEOUS | 0 refills | 30 days | Status: CP
Start: 2022-04-10 — End: ?

## 2022-04-10 NOTE — Unmapped (Signed)
Returned patient's call after being informed he wanted to speak with nurse manager regarding insulin prescription. Notified pt that provider had agreed to write a 30 day prescription for insulin but that an appointment was needed to further discuss blood sugar management. Advised patient that there would be no refills available and this would only be enough to bridge him to an appointment. Appt scheduled for 11/3 with PCP. Advised patient that if he has concerns about the prescribed Trulicity/Ozempic that those should be discussed at the appointment with PCP. Advised patient that if he blood sugar is elevated now and he is feeling poorly, we do advise he be seen at an Urgent Care today and not wait until the prescription is called in later this evening. Pt said he understood. Asked patient if he was going to follow advice. He stated he was unsure.

## 2022-04-10 NOTE — Unmapped (Signed)
Pt sent Mychart message reporting he was out of insulin and his blood glucose was elevated. Called pt , he reports he ran out of insulin yesterday. His blood glucose is 316 now and he c/o headache. Pt is requesting refill of My  insulin I take everyday. Pt unsure of name of insulin. Reviewed chart see insulin aspart on file. Pt states,  that is the one I ran out of and I want a refill now. He states he takes 35-40 units TID.  Informed pt message for insulin request will be sent to Dr Cleon Dew. Advised pt since his blood glucose is elevated and he has a headache he should go to local urgent care now to be evaluated. Pt speaking in loud tone. I want this medication refill now and tell Dr Cleon Dew to call me today. I do not want to go to urgent care and wont need to if I get my insulin. Pt requesting to speak with different nurse. Advised pt to expect call from one of clinic managers.

## 2022-04-11 DIAGNOSIS — E119 Type 2 diabetes mellitus without complications: Principal | ICD-10-CM

## 2022-04-11 DIAGNOSIS — Z794 Long term (current) use of insulin: Principal | ICD-10-CM

## 2022-04-11 MED ORDER — INSULIN ASPART (U-100) 100 UNIT/ML (3 ML) SUBCUTANEOUS PEN
Freq: Three times a day (TID) | SUBCUTANEOUS | 0 refills | 30 days | Status: CP
Start: 2022-04-11 — End: ?

## 2022-04-11 NOTE — Unmapped (Signed)
Transcribed to requested pharmacy

## 2022-04-12 DIAGNOSIS — E119 Type 2 diabetes mellitus without complications: Principal | ICD-10-CM

## 2022-04-12 DIAGNOSIS — Z794 Long term (current) use of insulin: Principal | ICD-10-CM

## 2022-04-12 MED ORDER — INSULIN ASPART (U-100) 100 UNIT/ML (3 ML) SUBCUTANEOUS PEN
Freq: Three times a day (TID) | SUBCUTANEOUS | 0 refills | 30.00000 days | Status: CP
Start: 2022-04-12 — End: 2022-04-12

## 2022-04-12 NOTE — Unmapped (Signed)
Resubmitted for zero refills s/p chart review

## 2022-04-12 NOTE — Unmapped (Signed)
Rx defaulted to print due to signature having character spaces that needed removed.   Corrected and submitted to [facility]

## 2022-04-13 DIAGNOSIS — E119 Type 2 diabetes mellitus without complications: Principal | ICD-10-CM

## 2022-04-13 DIAGNOSIS — Z794 Long term (current) use of insulin: Principal | ICD-10-CM

## 2022-04-13 MED ORDER — INSULIN ASPART (U-100) 100 UNIT/ML (3 ML) SUBCUTANEOUS PEN
Freq: Three times a day (TID) | SUBCUTANEOUS | 0 refills | 25 days | Status: CP
Start: 2022-04-13 — End: ?

## 2022-04-14 MED ORDER — TRULICITY 1.5 MG/0.5 ML SUBCUTANEOUS PEN INJECTOR
SUBCUTANEOUS | 0 refills | 28 days | Status: CP
Start: 2022-04-14 — End: 2022-05-06

## 2022-04-14 NOTE — Unmapped (Signed)
Transcribed to new pharmacy.

## 2022-04-14 NOTE — Unmapped (Signed)
Sent to [facility].

## 2022-04-20 NOTE — Unmapped (Signed)
Referring Provider: Mel Almond, MD     PCP:  Mel Almond, MD    04/20/2022    Chief Complaint: CKD4    Assessment/Plan:  [name] is a 56 y.o. patient with a PMH of NASH s/p OLT in 2013, T2DM and HTN who is being seen in consultation for CKD.     CKD Stage G4A3: Secondary to biopsy proven CNI nephropathy (in 2020 bx). This is compounded by his hypertension and inadequate diabetes control, which has resulted in significant albuminuria. We discussed at length the goal of reducing his albuminuria to limit his progression while at the same time addressing his primary risk factors for progression and reducing CVD risk.   - Repeat RFP today  - RASi: Not currently on based likely on low eGFR, but would likely benefit base don proteinuria. Can consider addition of MRA and discontinuation of hydralazine as below  - SGLT2i: Not currently on, but pending eGFR may be of benefit  - GLP1ra: dulaglutide 1.5 mg qWk    Hypertension: Near goal at home but on an odd regimen which is neither convenient nor addresses CVD risk/albuminuria.  - Current: amlodipine 5 mg daily, furosemide 40 mg daily, ImDUR 60 mg daily, spironolactone 25    KRT: We discussed that our goal is to maintain his current kidney function, but that he is likely to need KRT in the future based on his age, eGFR and albuminuria.  - Transplant: referred  - Modality: He was hesitant to even discuss this today based on his experiences in the hospital, but we did address this may be necessary in the future  - Acess: as above  - Infection: Hep B antibody/antigen and hep C antibody negative 3/23    MBD: PTH today    Anemia: Hemoglobin 12.4 in 3/23    Patient will return to clinic in 3 months.     ~~~~~~~~~~~~~~~~~~~~~~~~~~~~~~~~~~~~~~~~~~~~~~~~~~~~~~~~~~~~~~~~~~~~~~~~~~~~~~~~~~~~~~~~~~~~~~~~    HPI:  [name] is a 56 y.o. patient with a past medical history of NASH cirrhosis s/p OLT 2013), T2DM, HTN and nephrolithiasis who is seen in follow up of CKD.    Background: Prior to liver transplant, did not have any chronic kidney issues but did require dialysis in the perioperative period for presumed hepatorenal syndrome. This improved following transplant and he was able to come off of dialysis, but has dealt with CKD since that time. He was previously followed previously by [facility]. In addition to CKD, he developed both HTN and DM following transplant. His DM control has been difficult and he says postprandial blood sugars are often going into the 300s. His hypertension is better controlled at home than it is here in clinic, typically in the 130s.     He has had one kidney stone since that time as well and he reports several UTIs (although I do not see any positive UCx in the chart). No one in his family has had any kidney problems, but his uncle did have a liver transplant secondary to NASH as well.     ROS:  All other systems negative unless otherwise noted    PAST MEDICAL HISTORY:  Past Medical History:   Diagnosis Date    COVID-19 07/18/2019    Diabetes mellitus (CMS-HCC)     Family history of malignant neoplasm of prostate 06/23/2015    Hepatic cirrhosis (CMS-HCC) 06/23/2015    Overview:  Secondary to NASH; followed by Dr. Yevonne Pax, GI.  Last Assessment & Plan:  Relevant Hx: Course: Daily  Update: Today's Plan:     Hypertension     Liver cirrhosis secondary to NASH (nonalcoholic steatohepatitis) (CMS-HCC)     s/p liver transplant 2013     ALLERGIES  Insulin asp prt-insulin aspart and Insulin aspart  SOCIAL HISTORY  Social History     Socioeconomic History    Marital status: Married   Tobacco Use    Smoking status: Former    Smokeless tobacco: Never   Haematologist Use: Never used   Substance and Sexual Activity    Alcohol use: No    Drug use: No    Sexual activity: Yes     Partners: Female   Other Topics Concern    Do you use sunscreen? No    Tanning bed use? No    Are you easily burned? No    Excessive sun exposure? No    Blistering sunburns? 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 Living in [place] area currently.    FAMILY HISTORY  Family History   Problem Relation Age of Onset    Diabetes Father     Diabetes Paternal Grandfather     Melanoma Neg Hx     Basal cell carcinoma Neg Hx     Squamous cell carcinoma Neg Hx       MEDICATIONS:  Current Outpatient Medications   Medication Sig Dispense Refill    amLODIPine (NORVASC) 5 MG tablet Take 1 tablet (5 mg total) by mouth daily. 90 tablet 3    blood sugar diagnostic (CONTOUR NEXT TEST STRIPS) Strp Use to check blood sugar as directed with insulin 4 times a day & for symptoms of high or low blood sugar. 100 each 0    blood-glucose meter Misc USE AS DIRECTED 1 each 0    blood-glucose meter,continuous (DEXCOM G6 RECEIVER) Misc 1 each by Miscellaneous route in the morning. Dispense 1 receiver annually.  Sent to [facility]. 1 each 0    blood-glucose sensor (DEXCOM G6 SENSOR) Devi Use as directed to monitor blood glucose 3 each 0    blood-glucose sensor (DEXCOM G6 SENSOR) Devi 1 each by Miscellaneous route in the morning. [facility]. 9 each 3    blood-glucose transmitter (DEXCOM G6 TRANSMITTER) Devi Use as directed to monitor blood glucose 1 each 0    blood-glucose transmitter (DEXCOM G6 TRANSMITTER) Devi 1 each by Miscellaneous route Every three (3) months. [facility] pharmacy 3 each 4    CHOLECALCIFEROL, VITAMIN D3, (VITAMIN D3 ORAL) Take 2,000 Units by mouth daily.      dulaglutide (TRULICITY) 1.5 mg/0.5 mL PnIj Inject 0.5 mL (1.5 mg total) under the skin every seven (7) days for 4 doses. 2 mL 0    furosemide (LASIX) 20 MG tablet Take 2 tablets (40 mg  total) by mouth daily. 60 tablet 6    glucagon (BAQSIMI) 3 mg/actuation Spry Use 1 spray into the left nostril once as needed for up to 1 dose. 2 each 0    insulin ASPART (NOVOLOG FLEXPEN) 100 unit/mL (3 mL) injection pen Inject 0-0.2 mL (0-20 Units total) under the skin Three (3) times a day before meals. Take the [CORRECTIONAL]  Correction insulin scale based on Insulin sensitivity factor 20: BG 150-169 = 1 unit BG 170-189 = 2 units BG 190-209 = 3 units BG 210-229 = 4 units BG 230-249 = 5 units BG 250-269 = 6 units BG 270-289 = 7 units BG 290-300 = 8 units BG > 300 = 9 units and notify provider.  Zero refills permitted. Patient aware to see provider. 15 mL 0    insulin degludec (TRESIBA FLEXTOUCH U-100) 100 unit/mL (3 mL) InPn Inject 0.12 mL (12 Units total) under the skin daily. 15 mL 1    isosorbide mononitrate (IMDUR) 30 MG 24 hr tablet Take 2 tablets (60 mg total) by mouth daily. 60 tablet 0    lancets Misc Use to check blood sugar as directed with insulin 4 times a day & for symptoms of high or low blood sugar. 100 each 0    magnesium oxide (MAG-OX) 400 mg (241.3 mg elemental magnesium) tablet Take 1 tablet (400 mg total) by mouth.      multivitamin (TAB-A-VITE/THERAGRAN) per tablet Take 1 tablet by mouth daily.      mycophenolate (CELLCEPT) 250 mg capsule Take 1 capsule (250 mg total) by mouth Two (2) times a day. 60 capsule 11    nitroglycerin (NITROSTAT) 0.4 MG SL tablet Place 1 tablet (0.4 mg total) under the tongue every five (5) minutes as needed for chest pain. Maximum of 3 doses in 15 minutes. 25 tablet 0    pen needle, diabetic 32 gauge x 5/32 (4 mm) Ndle Use with insulin up to 4 times a day as needed. 100 each 0    spironolactone (ALDACTONE) 25 MG tablet Take 1 tablet (25 mg total) by mouth daily. 90 tablet 3    tacrolimus (PROGRAF) 0.5 MG capsule Take 1 capsule (0.5 mg total) by mouth two (2) times a day. 180 capsule 3    terbinafine HCL (LAMISIL) 1 % cream Apply to left big toenail and between left smallest toe and the toe adjacent to it (between 4th and 5th toes) twice per day 12 g 11    triamcinolone (KENALOG) 0.1 % cream Apply topically Two (2) times a day. Apply to itchy rash until skin is smooth, then stop. Restart as needed 15 g 1    valsartan (DIOVAN) 80 MG tablet Take 1 tablet (80 mg total) by mouth daily. 30 tablet 0     No current facility-administered medications for this visit.     PHYSICAL EXAM:     There were no vitals filed for this visit.    Gen: alert, well appearing conversant without difficulty, NAD  Eyes: EOMI, sclera anicteric  Card: RRR, minimal LE edema  Skin: No rashes or lesions appreciated  Neuro: Strength and sensation grossly intact, no focal motor or sensory deficits appreciated    MEDICAL DECISION MAKING  Results for orders placed or performed during the hospital encounter of 10/24/21   Comprehensive metabolic panel   Result Value Ref Range    Sodium 140 135 - 145 mmol/L    Potassium 3.6 3.4 - 4.8 mmol/L    Chloride 105 98 - 107  mmol/L    CO2 26.0 20.0 - 31.0 mmol/L    Anion Gap 9 5 - 14 mmol/L    BUN 46 (H) 9 - 23 mg/dL    Creatinine 9.60 (H) 0.60 - 1.10 mg/dL    BUN/Creatinine Ratio 13     eGFR CKD-EPI (2021) Male 19 (L) >=60 mL/min/1.53m2    Glucose 174 70 - 179 mg/dL    Calcium 9.0 8.7 - 45.4 mg/dL    Albumin 3.0 (L) 3.4 - 5.0 g/dL    Total Protein 6.9 5.7 - 8.2 g/dL    Total Bilirubin 1.5 (H) 0.3 - 1.2 mg/dL    AST 29 <=09 U/L    ALT 29 10 - 49 U/L    Alkaline Phosphatase 84 46 - 116 U/L   hsTroponin I (single, no delta)   Result Value Ref Range    hsTroponin I 309 (HH) <=53 ng/L   Magnesium   Result Value Ref Range    Magnesium 1.8 1.6 - 2.6 mg/dL   B-type natriuretic peptide (BNP)   Result Value Ref Range    BNP 1,115 (H) <=100 pg/mL   hsTroponin I (serial 2-6H CONTINUATION w/ delta)   Result Value Ref Range    hsTroponin I 276 (HH) <=53 ng/L    delta hsTroponin I 33 (HH) <=7 ng/L   Basic metabolic panel   Result Value Ref Range    Sodium 141 135 - 145 mmol/L    Potassium 3.3 (L) 3.4 - 4.8 mmol/L    Chloride 106 98 - 107 mmol/L    CO2 25.0 20.0 - 31.0 mmol/L    Anion Gap 10 5 - 14 mmol/L    BUN 45 (H) 9 - 23 mg/dL    Creatinine 8.11 (H) 0.60 - 1.10 mg/dL    BUN/Creatinine Ratio 13     eGFR CKD-EPI (2021) Male 21 (L) >=60 mL/min/1.36m2    Glucose 149 70 - 179 mg/dL    Calcium 9.1 8.7 - 91.4 mg/dL   Magnesium Level   Result Value Ref Range    Magnesium 1.7 1.6 - 2.6 mg/dL   CBC   Result Value Ref Range    WBC 9.2 3.6 - 11.2 10*9/L    RBC 4.31 4.26 - 5.60 10*12/L    HGB 11.9 (L) 12.9 - 16.5 g/dL    HCT 78.2 (L) 95.6 - 48.0 %    MCV 82.7 77.6 - 95.7 fL    MCH 27.6 25.9 - 32.4 pg    MCHC 33.4 32.0 - 36.0 g/dL    RDW 21.3 08.6 - 57.8 %    MPV 8.4 6.8 - 10.7 fL    Platelet 279 150 - 450 10*9/L   Basic Metabolic Panel   Result Value Ref Range    Sodium 139 135 - 145 mmol/L    Potassium 3.7 3.4 - 4.8 mmol/L    Chloride 104 98 - 107 mmol/L    CO2 27.0 20.0 - 31.0 mmol/L    Anion Gap 8 5 - 14 mmol/L    BUN 46 (H) 9 - 23 mg/dL    Creatinine 4.69 (H) 0.60 - 1.10 mg/dL    BUN/Creatinine Ratio 13     eGFR CKD-EPI (2021) Male 20 (L) >=60 mL/min/1.1m2    Glucose 270 (H) 70 - 179 mg/dL    Calcium 8.9 8.7 - 62.9 mg/dL   Basic metabolic panel   Result Value Ref Range    Sodium 141 135 - 145 mmol/L    Potassium 3.6  3.4 - 4.8 mmol/L    Chloride 107 98 - 107 mmol/L    CO2 25.0 20.0 - 31.0 mmol/L    Anion Gap 9 5 - 14 mmol/L    BUN 49 (H) 9 - 23 mg/dL    Creatinine 1.61 (H) 0.60 - 1.10 mg/dL    BUN/Creatinine Ratio 13     eGFR CKD-EPI (2021) Male 19 (L) >=60 mL/min/1.54m2    Glucose 109 70 - 179 mg/dL    Calcium 8.8 8.7 - 09.6 mg/dL   CBC   Result Value Ref Range    WBC 7.3 3.6 - 11.2 10*9/L    RBC 4.15 (L) 4.26 - 5.60 10*12/L    HGB 11.3 (L) 12.9 - 16.5 g/dL    HCT 04.5 (L) 40.9 - 48.0 %    MCV 82.6 77.6 - 95.7 fL    MCH 27.3 25.9 - 32.4 pg    MCHC 33.0 32.0 - 36.0 g/dL    RDW 81.1 91.4 - 78.2 %    MPV 8.2 6.8 - 10.7 fL    Platelet 255 150 - 450 10*9/L   Tacrolimus Level, Trough   Result Value Ref Range    Tacrolimus, Trough 2.1 (L) 5.0 - 15.0 ng/mL   ECG 12 Lead   Result Value Ref Range    EKG Systolic BP  mmHg    EKG Diastolic BP  mmHg    EKG Ventricular Rate 83 BPM    EKG Atrial Rate 83 BPM    EKG P-R Interval 262 ms    EKG QRS Duration 144 ms    EKG Q-T Interval 458 ms    EKG QTC Calculation 538 ms    EKG Calculated P Axis 84 degrees    EKG Calculated R Axis -86 degrees    EKG Calculated T Axis 0 degrees    QTC Fredericia 510 ms   POCT Glucose   Result Value Ref Range    Glucose, POC 274 (H) 70 - 179 mg/dL   POCT Glucose   Result Value Ref Range    Glucose, POC 167 70 - 179 mg/dL   POCT Glucose   Result Value Ref Range    Glucose, POC 170 70 - 179 mg/dL   POCT Glucose   Result Value Ref Range    Glucose, POC 218 (H) 70 - 179 mg/dL   POCT Glucose   Result Value Ref Range    Glucose, POC 125 70 - 179 mg/dL   POCT Glucose   Result Value Ref Range    Glucose, POC 116 70 - 179 mg/dL   POCT Glucose   Result Value Ref Range    Glucose, POC 138 70 - 179 mg/dL   CBC w/ Differential   Result Value Ref Range    WBC 10.9 3.6 - 11.2 10*9/L    RBC 4.28 4.26 - 5.60 10*12/L    HGB 11.6 (L) 12.9 - 16.5 g/dL    HCT 95.6 (L) 21.3 - 48.0 %    MCV 81.8 77.6 - 95.7 fL    MCH 27.2 25.9 - 32.4 pg    MCHC 33.2 32.0 - 36.0 g/dL    RDW 08.6 57.8 - 46.9 %    MPV 8.0 6.8 - 10.7 fL    Platelet 268 150 - 450 10*9/L    Neutrophils % 84.1 %    Lymphocytes % 8.7 %    Monocytes % 5.3 %    Eosinophils % 1.1 %    Basophils % 0.8 %  Absolute Neutrophils 9.2 (H) 1.8 - 7.8 10*9/L    Absolute Lymphocytes 0.9 (L) 1.1 - 3.6 10*9/L    Absolute Monocytes 0.6 0.3 - 0.8 10*9/L    Absolute Eosinophils 0.1 0.0 - 0.5 10*9/L    Absolute Basophils 0.1 0.0 - 0.1 10*9/L     *Note: Due to a large number of results and/or encounters for the 12.2 - 15.2 %    MPV 8.2 6.8 - 10.7 fL    Platelet 255 150 - 450 10*9/L   Tacrolimus Level, Trough   Result Value Ref Range    Tacrolimus, Trough 2.1 (L) 5.0 - 15.0 ng/mL   ECG 12 Lead   Result Value Ref Range    EKG Systolic BP  mmHg    EKG Diastolic BP  mmHg    EKG Ventricular Rate 83 BPM    EKG Atrial Rate 83 BPM    EKG P-R Interval 262 ms    EKG QRS Duration 144 ms    EKG Q-T Interval 458 ms    EKG QTC Calculation 538 ms    EKG Calculated P Axis 84 degrees    EKG Calculated R Axis -86 degrees    EKG Calculated T Axis 0 degrees    QTC Fredericia 510 ms   POCT Glucose   Result Value Ref Range    Glucose, POC 274 (H) 70 - 179 mg/dL   POCT Glucose   Result Value Ref Range    Glucose, POC 167 70 - 179 mg/dL   POCT Glucose   Result Value Ref Range    Glucose, POC 170 70 - 179 mg/dL   POCT Glucose   Result Value Ref Range    Glucose, POC 218 (H) 70 - 179 mg/dL   POCT Glucose   Result Value Ref Range    Glucose, POC 125 70 - 179 mg/dL   POCT Glucose   Result Value Ref Range    Glucose, POC 116 70 - 179 mg/dL   POCT Glucose   Result Value Ref Range    Glucose, POC 138 70 - 179 mg/dL   CBC w/ Differential   Result Value Ref Range    WBC 10.9 3.6 - 11.2 10*9/L    RBC 4.28 4.26 - 5.60 10*12/L    HGB 11.6 (L) 12.9 - 16.5 g/dL    HCT 66.4 (L) 40.3 - 48.0 %    MCV 81.8 77.6 - 95.7 fL    MCH 27.2 25.9 - 32.4 pg    MCHC 33.2 32.0 - 36.0 g/dL    RDW 47.4 25.9 - 56.3 %    MPV 8.0 6.8 - 10.7 fL    Platelet 268 150 - 450 10*9/L    Neutrophils % 84.1 %    Lymphocytes % 8.7 %    Monocytes % 5.3 %    Eosinophils % 1.1 %    Basophils % 0.8 %    Absolute Neutrophils 9.2 (H) 1.8 - 7.8 10*9/L    Absolute Lymphocytes 0.9 (L) 1.1 - 3.6 10*9/L    Absolute Monocytes 0.6 0.3 - 0.8 10*9/L    Absolute Eosinophils 0.1 0.0 - 0.5 10*9/L    Absolute Basophils 0.1 0.0 - 0.1 10*9/L     *Note: Due to a large number of results and/or encounters for the requested time period, some results have not been displayed. A complete set of results can be found in Results Review.        Creatinine trend:  Creatinine   Date  Value Ref Range Status   10/26/2021 3.63 (H) 0.60 - 1.10 mg/dL Final   16/03/9603 5.40 (H) 0.60 - 1.10 mg/dL Final   98/04/9146 8.29 (H) 0.60 - 1.10 mg/dL Final   56/21/3086 5.78 (H) 0.60 - 1.10 mg/dL Final   46/96/2952 8.41 (H) 0.60 - 1.10 mg/dL Final   32/44/0102 7.25 mg/dL Final   36/64/4034 7.42 (H) 0.70 - 1.30 mg/dL Final   59/56/3875 6.43 (H) 0.70 - 1.30 mg/dL Final   32/95/1884 1.66 (H) 0.70 - 1.30 mg/dL Final   12/24/1599 0.93  Final   03/10/2013 1.29  Final         Urine Sediment: Not examined today    IMAGING STUDIES: Reviewed    Biopsy 06/04/2019:

## 2022-04-21 ENCOUNTER — Ambulatory Visit
Admit: 2022-04-21 | Discharge: 2022-04-21 | Payer: PRIVATE HEALTH INSURANCE | Attending: Nephrology | Primary: Nephrology

## 2022-04-21 ENCOUNTER — Ambulatory Visit: Admit: 2022-04-21 | Discharge: 2022-04-21 | Payer: PRIVATE HEALTH INSURANCE

## 2022-04-21 DIAGNOSIS — N184 Chronic kidney disease, stage 4 (severe): Principal | ICD-10-CM

## 2022-04-21 DIAGNOSIS — Z5181 Encounter for therapeutic drug level monitoring: Principal | ICD-10-CM

## 2022-04-21 DIAGNOSIS — E1129 Type 2 diabetes mellitus with other diabetic kidney complication: Principal | ICD-10-CM

## 2022-04-21 DIAGNOSIS — I5033 Acute on chronic diastolic (congestive) heart failure: Principal | ICD-10-CM

## 2022-04-21 DIAGNOSIS — R809 Proteinuria, unspecified: Principal | ICD-10-CM

## 2022-04-21 DIAGNOSIS — Z944 Liver transplant status: Principal | ICD-10-CM

## 2022-04-21 DIAGNOSIS — E1165 Type 2 diabetes mellitus with hyperglycemia: Principal | ICD-10-CM

## 2022-04-21 DIAGNOSIS — E612 Magnesium deficiency: Principal | ICD-10-CM

## 2022-04-21 LAB — RENAL FUNCTION PANEL
ALBUMIN: 3.3 g/dL — ABNORMAL LOW (ref 3.4–5.0)
ANION GAP: 10 mmol/L (ref 5–14)
BLOOD UREA NITROGEN: 47 mg/dL — ABNORMAL HIGH (ref 9–23)
BUN / CREAT RATIO: 11
CALCIUM: 9.7 mg/dL (ref 8.7–10.4)
CHLORIDE: 100 mmol/L (ref 98–107)
CO2: 29.4 mmol/L (ref 20.0–31.0)
CREATININE: 4.11 mg/dL — ABNORMAL HIGH
EGFR CKD-EPI (2021) MALE: 16 mL/min/{1.73_m2} — ABNORMAL LOW (ref >=60)
GLUCOSE RANDOM: 157 mg/dL (ref 70–179)
PHOSPHORUS: 3.3 mg/dL (ref 2.4–5.1)
POTASSIUM: 2.7 mmol/L — ABNORMAL LOW (ref 3.4–4.8)
SODIUM: 139 mmol/L (ref 135–145)

## 2022-04-21 LAB — CBC
HEMATOCRIT: 40.3 % (ref 39.0–48.0)
HEMOGLOBIN: 13.6 g/dL (ref 12.9–16.5)
MEAN CORPUSCULAR HEMOGLOBIN CONC: 33.9 g/dL (ref 32.0–36.0)
MEAN CORPUSCULAR HEMOGLOBIN: 27.3 pg (ref 25.9–32.4)
MEAN CORPUSCULAR VOLUME: 80.4 fL (ref 77.6–95.7)
MEAN PLATELET VOLUME: 7.3 fL (ref 6.8–10.7)
PLATELET COUNT: 295 10*9/L (ref 150–450)
RED BLOOD CELL COUNT: 5.01 10*12/L (ref 4.26–5.60)
RED CELL DISTRIBUTION WIDTH: 13.6 % (ref 12.2–15.2)
WBC ADJUSTED: 8.8 10*9/L (ref 3.6–11.2)

## 2022-04-21 LAB — MAGNESIUM: MAGNESIUM: 2 mg/dL (ref 1.6–2.6)

## 2022-04-21 LAB — GAMMA GT: GAMMA GLUTAMYL TRANSFERASE: 95 U/L — ABNORMAL HIGH

## 2022-04-21 LAB — ALKALINE PHOSPHATASE: ALKALINE PHOSPHATASE: 130 U/L — ABNORMAL HIGH (ref 46–116)

## 2022-04-21 LAB — BILIRUBIN, DIRECT: BILIRUBIN DIRECT: 0.1 mg/dL (ref 0.00–0.30)

## 2022-04-21 LAB — BILIRUBIN, TOTAL: BILIRUBIN TOTAL: 0.5 mg/dL (ref 0.3–1.2)

## 2022-04-21 LAB — AST: AST (SGOT): 32 U/L (ref <=34)

## 2022-04-21 LAB — ALT: ALT (SGPT): 37 U/L (ref 10–49)

## 2022-04-21 NOTE — Unmapped (Signed)
Called patient regarding potassium of 2.7 and higher creatinine. Suspect related to slight over-diuresis with furosemide. Asked him to hold this over the weekend and we will recheck on Monday when he is here to see Transplant Nephrology.    [name]  Nephrology and Hypertension  Pager: [phone]  Office: [phone]  April 21, 2022 3:47 PM

## 2022-04-21 NOTE — Unmapped (Signed)
Patient seen in nephrology clinic today by Dr.Zeitler and completed bmp. Noted that K at 2.7. Reached out to Dr.Zeitler, who said he would call and discuss this with the patient, and that the patient would return to [facility] on Monday for kid txp appt and would repeat labs then.     Since it had been over 5 mos since liver txp labs were completed, added on hepatic fxn panel. Spoke to patient, who said that he had not taken his tac this morning before labs and that the level would be reliable. Added this lab on as well. Patient had discussed some GI issues with Dr.Zeitler, which were discussed further with the patient. He reports he gets severe lower abdominal pain if he stands too long, walks too far, takes too warm a shower, or experiences stress and then he he violently vomits. He denies any blood in his emesis or stools. Let him know this would be shared with his hepatology provider. He verbalized understanding.

## 2022-04-21 NOTE — Unmapped (Signed)
Addended by: Carmina Miller F on: 04/21/2022 08:39 AM     Modules accepted: Orders

## 2022-04-22 LAB — TACROLIMUS LEVEL, TROUGH: TACROLIMUS, TROUGH: 1 ng/mL — ABNORMAL LOW (ref 5.0–15.0)

## 2022-04-24 ENCOUNTER — Ambulatory Visit: Admit: 2022-04-24 | Discharge: 2022-04-24 | Payer: PRIVATE HEALTH INSURANCE

## 2022-04-24 ENCOUNTER — Institutional Professional Consult (permissible substitution): Admit: 2022-04-24 | Discharge: 2022-04-24 | Payer: PRIVATE HEALTH INSURANCE

## 2022-04-24 ENCOUNTER — Ambulatory Visit
Admit: 2022-04-24 | Discharge: 2022-04-24 | Payer: PRIVATE HEALTH INSURANCE | Attending: Nephrology | Primary: Nephrology

## 2022-04-24 DIAGNOSIS — Z01818 Encounter for other preprocedural examination: Principal | ICD-10-CM

## 2022-04-24 DIAGNOSIS — R1084 Generalized abdominal pain: Principal | ICD-10-CM

## 2022-04-24 DIAGNOSIS — Z944 Liver transplant status: Principal | ICD-10-CM

## 2022-04-24 DIAGNOSIS — N184 Chronic kidney disease, stage 4 (severe): Principal | ICD-10-CM

## 2022-04-24 DIAGNOSIS — I10 Essential (primary) hypertension: Principal | ICD-10-CM

## 2022-04-24 LAB — URIC ACID: URIC ACID: 11.5 mg/dL — ABNORMAL HIGH

## 2022-04-24 LAB — HLA ANTIBODY SCREEN

## 2022-04-24 LAB — COMPREHENSIVE METABOLIC PANEL
ALBUMIN: 3.5 g/dL (ref 3.4–5.0)
ALKALINE PHOSPHATASE: 121 U/L — ABNORMAL HIGH (ref 46–116)
ALT (SGPT): 41 U/L (ref 10–49)
ANION GAP: 12 mmol/L (ref 5–14)
AST (SGOT): 34 U/L (ref <=34)
BILIRUBIN TOTAL: 0.9 mg/dL (ref 0.3–1.2)
BLOOD UREA NITROGEN: 49 mg/dL — ABNORMAL HIGH (ref 9–23)
BUN / CREAT RATIO: 13
CALCIUM: 9.5 mg/dL (ref 8.7–10.4)
CHLORIDE: 98 mmol/L (ref 98–107)
CO2: 27 mmol/L (ref 20.0–31.0)
CREATININE: 3.86 mg/dL — ABNORMAL HIGH
EGFR CKD-EPI (2021) MALE: 18 mL/min/{1.73_m2} — ABNORMAL LOW (ref >=60)
GLUCOSE RANDOM: 267 mg/dL — ABNORMAL HIGH (ref 70–99)
POTASSIUM: 2.8 mmol/L — ABNORMAL LOW (ref 3.4–4.8)
PROTEIN TOTAL: 7.2 g/dL (ref 5.7–8.2)
SODIUM: 137 mmol/L (ref 135–145)

## 2022-04-24 LAB — CBC W/ AUTO DIFF
BASOPHILS ABSOLUTE COUNT: 0.1 10*9/L (ref 0.0–0.1)
BASOPHILS RELATIVE PERCENT: 0.8 %
EOSINOPHILS ABSOLUTE COUNT: 0.5 10*9/L (ref 0.0–0.5)
EOSINOPHILS RELATIVE PERCENT: 5 %
HEMATOCRIT: 39.4 % (ref 39.0–48.0)
HEMOGLOBIN: 13.5 g/dL (ref 12.9–16.5)
LYMPHOCYTES ABSOLUTE COUNT: 1.2 10*9/L (ref 1.1–3.6)
LYMPHOCYTES RELATIVE PERCENT: 12.8 %
MEAN CORPUSCULAR HEMOGLOBIN CONC: 34.3 g/dL (ref 32.0–36.0)
MEAN CORPUSCULAR HEMOGLOBIN: 27.2 pg (ref 25.9–32.4)
MEAN CORPUSCULAR VOLUME: 79.4 fL (ref 77.6–95.7)
MEAN PLATELET VOLUME: 7.4 fL (ref 6.8–10.7)
MONOCYTES ABSOLUTE COUNT: 0.6 10*9/L (ref 0.3–0.8)
MONOCYTES RELATIVE PERCENT: 5.8 %
NEUTROPHILS ABSOLUTE COUNT: 7.3 10*9/L (ref 1.8–7.8)
NEUTROPHILS RELATIVE PERCENT: 75.6 %
PLATELET COUNT: 296 10*9/L (ref 150–450)
RED BLOOD CELL COUNT: 4.96 10*12/L (ref 4.26–5.60)
RED CELL DISTRIBUTION WIDTH: 13.6 % (ref 12.2–15.2)
WBC ADJUSTED: 9.7 10*9/L (ref 3.6–11.2)

## 2022-04-24 LAB — LIPID PANEL
CHOLESTEROL/HDL RATIO SCREEN: 6.4 — ABNORMAL HIGH (ref 1.0–4.5)
CHOLESTEROL: 211 mg/dL — ABNORMAL HIGH (ref <=200)
HDL CHOLESTEROL: 33 mg/dL — ABNORMAL LOW (ref 40–60)
LDL CHOLESTEROL CALCULATED: 104 mg/dL — ABNORMAL HIGH (ref 40–99)
NON-HDL CHOLESTEROL: 178 mg/dL — ABNORMAL HIGH (ref 70–130)
TRIGLYCERIDES: 369 mg/dL — ABNORMAL HIGH (ref 0–150)
VLDL CHOLESTEROL CAL: 73.8 mg/dL — ABNORMAL HIGH (ref 12–47)

## 2022-04-24 LAB — HEMOGLOBIN A1C
ESTIMATED AVERAGE GLUCOSE: 255 mg/dL
HEMOGLOBIN A1C: 10.5 % — ABNORMAL HIGH (ref 4.8–5.6)

## 2022-04-24 LAB — BILIRUBIN, DIRECT: BILIRUBIN DIRECT: 0.2 mg/dL (ref 0.00–0.30)

## 2022-04-24 LAB — PROTIME-INR
INR: 0.91
PROTIME: 10.2 s (ref 9.9–12.6)

## 2022-04-24 LAB — GAMMA GT: GAMMA GLUTAMYL TRANSFERASE: 109 U/L — ABNORMAL HIGH

## 2022-04-24 LAB — APTT
APTT: 26.1 s (ref 24.8–38.4)
HEPARIN CORRELATION: <0.2

## 2022-04-24 LAB — LACTATE DEHYDROGENASE: LACTATE DEHYDROGENASE: 405 U/L — ABNORMAL HIGH (ref 120–246)

## 2022-04-24 LAB — MAGNESIUM: MAGNESIUM: 2 mg/dL (ref 1.6–2.6)

## 2022-04-24 LAB — PHOSPHORUS: PHOSPHORUS: 4.2 mg/dL (ref 2.4–5.1)

## 2022-04-24 LAB — PSA: PROSTATE SPECIFIC ANTIGEN: 0.24 ng/mL (ref 0.00–4.00)

## 2022-04-24 MED ORDER — MYCOPHENOLATE MOFETIL 250 MG CAPSULE
ORAL_CAPSULE | Freq: Two times a day (BID) | ORAL | 11 refills | 30 days | Status: CP
Start: 2022-04-24 — End: ?
  Filled 2022-05-03: qty 60, 30d supply, fill #0

## 2022-04-24 NOTE — Unmapped (Signed)
Amlodipine ( Norvasc 5 mg) given  po this morning at 0850 am.

## 2022-04-24 NOTE — Unmapped (Signed)
Referral to Dr. Dorita Fray entered per NP Harms recommendation.

## 2022-04-24 NOTE — Unmapped (Signed)
Patient attended kidney transplant orientation class at [place] today and was instructed to attend the rest of their scheduled appointments. Orientation class was 1 hour and 0 minutes long.  First half of patient education checklist signed today.    Patient did not sign any additional consent(s).

## 2022-04-24 NOTE — Unmapped (Signed)
I met with Richard [name] regarding his current transplant insurance plan benefits. I discussed the Financial Agreement for Transplant including, current insurance benefits, prescription drug benefits, estimates of possible post-transplant medications and potential out-of-pocket expenses.    I recommend that Richard [name]:  Consider fundraising to help with transplant expenses    Richard [name] signed the Financial Agreement for Transplant acknowledging that  he read and understands the agreement.

## 2022-04-24 NOTE — Unmapped (Signed)
BP 134/69 @  9:40 AM  left deltoid

## 2022-04-24 NOTE — Unmapped (Addendum)
[facility] [facility] transplant nephrology consult note    assessment and plan  1. kidney transplant medical evaluation. [name] appears to be a potential acceptable kidney transplant candidate pending weight loss to achieve goal bmi < 40 and completion of medical evaluation. benefit vs risk of kidney transplant incl surgery, anesthesia and incr risk of infection and malignancy d/t immunosuppression medications reviewed. living donor vs. deceased donor kidney, paired donor exchange, hepatitis c virus+ donor and kdpi > 85 waitlisting options reviewed.   2. lymphocyte depleting induction with steroid free maintenance regimen recommended.  3. [place] [facility] appt vs primary care physician follow up appt re: weight loss to goal bmi < 40 and improved glycemic control through diet modification, incr regular physical exercise +/-incr dose glp1 ra medication encouraged.  4. nm cards stress  5. abdomen/pelvis ct w/o contrast '23   6. preventive medicine. psa. colonoscopy '20 with 5-10 year follow up recommended. dermatology '23. influenza covid19 and prevnar20 pneumococcal vaccines recommended.    history of present illness    Richard [name] is a 56 year old gentleman with stage iv chronic kidney disease 2/2 hypertension and calcineurin inh s/p liver transplant 03/02/2012 +/-dm nephropathy re: severe albuminuria. he is seen today in evaluation at request of dr. Clayburn Pert zeitler for kidney transplant evaluation. he feels good in usual state of health. +[place] hospital admission 3/5-9 with type2 myocardial infarction d/t hypertension. +[facility] hospital admission 5/1-3/23 with dyspnea and type2 myocardial infarction attributed to heart failure with preserved ejection fraction exacerbation and hypertension.    no fever chills or sweats. no weight change. no headache or lightheaded. no chest pain palpitations cough or shortness of breath. +able to walk x2 flights of stairs. no orthopnea or lower extremity edema. appetite nl. +intermittent lower abdominal pain with nausea and vomiting associated with stress and standing, improves with lying down. no diarrhea or constipation. no dysuria hematuria or difficulty voiding. pain reported as different than nephrolithiasis symptoms. blood sugar avg 200s; no levemir insulin x weeks d/t needing new prescription. blood pressure 130-140s/70-80s. all other systems reviewed and negative x10 systems.    past medical hx:  1. s/p liver transplant 03/02/2012. nafld. basiliximab induction.  2. stage iv chronic kidney disease. cni nephrotoxicity htn. +/-dm nephropathy.  3. hypertension  4. heart failure with preserved ejection fraction. echocardiogram '23: lvef > 55%. diastolic lv dysfunction.  5. diabetes mellitus2  6. nephrolithiasis  7. morbid obesity    past surgical hx: liver transplant '13.    allergies: no known drug allergies    medications: tacrolimus 0.5mg  bid, mycophenolate 180mg  bid, amlodipine 5mg  daily, semaglutide 0.5mg  weekly, novolog insulin prn, levemir insulin/none x ?weeks, vitamin d 2,000u daily, mg oxide 400mg  daily, multivitamin daily.    soc hx: married. former smoking hx.    fam hx: no known kidney disease. m.uncle liver disease 2/2 nafld s/p liver transplant.    physical exam: t98.2 p74 bp134/69 wt128.1kg bmi 42.3. wd/wn gentleman appropriate affect and mood. sclera anicteric. mmm no thrush. neck supple no palpable ln. heart rrr nl s1s2 no m/r/g. lungs clear bilateral. abd soft obese nt/nd. no lower ext edema. msk no synovitis or tophi. skin no rash. neuro alert oriented non focal exam.    labs 04/21/22: wbc8.8 hgb13.6 hct40.3 plts295. na139 k2.7 cl100 bicarb29 bun47 cr4.1 glc157 ca9.7 mg2 phos3.3 albumin3.3 tacrolimus lvl 1.0 ng/ml.

## 2022-04-25 DIAGNOSIS — Z944 Liver transplant status: Principal | ICD-10-CM

## 2022-04-25 LAB — SYPHILIS SCREEN: SYPHILIS RPR SCREEN: NONREACTIVE

## 2022-04-25 LAB — HEPATITIS B SURFACE ANTIBODY
HEPATITIS B SURFACE ANTIBODY QUANT: <8 m[IU]/mL (ref <8.00)
HEPATITIS B SURFACE ANTIBODY: NONREACTIVE

## 2022-04-25 LAB — HEPATITIS A IGG: HEPATITIS A IGG: NONREACTIVE

## 2022-04-25 LAB — HEPATITIS B SURFACE ANTIGEN: HEPATITIS B SURFACE ANTIGEN: NONREACTIVE

## 2022-04-25 LAB — HEPATITIS B CORE ANTIBODY, TOTAL: HEPATITIS B CORE TOTAL ANTIBODY: NONREACTIVE

## 2022-04-25 LAB — HEPATITIS C ANTIBODY: HEPATITIS C ANTIBODY: NONREACTIVE

## 2022-04-25 LAB — HIV ANTIGEN/ANTIBODY COMBO: HIV ANTIGEN/ANTIBODY COMBO: NONREACTIVE

## 2022-04-25 MED ORDER — TACROLIMUS 0.5 MG CAPSULE, IMMEDIATE-RELEASE
ORAL_CAPSULE | Freq: Two times a day (BID) | ORAL | 3 refills | 90 days | Status: CP
Start: 2022-04-25 — End: ?

## 2022-04-25 NOTE — Unmapped (Addendum)
[facility] Pharmacist has reviewed a new prescription for Prograf that indicates a dose increase.  Patient was counseled on this dosage change by KW- see epic note from 04/25/22.  Next refill call date adjusted if necessary.            Clinical Assessment Needed For: Dose Change  Medication: Prograf 0.5mg  capsule  Last Fill Date/Day Supply: 04/04/2022 / 30 days  Copay $0  Was previous dose already scheduled to fill: No    Notes to Pharmacist: N/A

## 2022-04-25 NOTE — Unmapped (Signed)
Reviewed 10/30 subtherapeutic tac level with NP Harms:  From: Linnell Fulling, ANP  Sent: 04/24/2022  10:50 PM EDT  To: Johnna Acosta, RN; *  Subject: tac dose                                         Increase to 1 mg BID. Recheck in 2 weeks.  Thanks  ----- Message -----  From: Johnna Acosta, RN  Sent: 04/24/2022  11:38 AM EDT  To: Genia Harold, RN; Linnell Fulling, ANP    Vernona Rieger confirmed this is a true tac trough. Do you want to adjust?    Called pt to convey above, will start new dose this morning.

## 2022-04-26 LAB — QUANTIFERON TB GOLD PLUS
QUANTIFERON ANTIGEN 1 MINUS NIL: 0.03 [IU]/mL
QUANTIFERON ANTIGEN 2 MINUS NIL: 0.01 [IU]/mL
QUANTIFERON MITOGEN: 2.24 [IU]/mL
QUANTIFERON TB GOLD PLUS: NEGATIVE
QUANTIFERON TB NIL VALUE: 0.04 [IU]/mL

## 2022-04-26 LAB — TB NIL: TB NIL VALUE: 0.04

## 2022-04-26 LAB — TB AG2: TB AG2 VALUE: 0.05

## 2022-04-26 LAB — TB MITOGEN: TB MITOGEN VALUE: 2.28

## 2022-04-26 LAB — TB AG1: TB AG1 VALUE: 0.07

## 2022-04-27 NOTE — Unmapped (Signed)
[place] Assessment of Medications Program (CAMP)        DIABETES RECRUITMENT SUMMARY NOTE     CAMP is a team of pharmacists and pharmacy technicians within [facility] that supports patients and providers.     Patient was identified for CAMP services based on criteria including, 54 years of age or older, with a recent managing provider visit, and A1c >= 8%     A letter has been sent explaining program services.      Lorana Maffeo, CPhT   Certified Pharmacy Technician   [place] Assessment of Medications Program (CAMP)   P [phone], F [phone]

## 2022-04-28 ENCOUNTER — Ambulatory Visit: Admit: 2022-04-28 | Discharge: 2022-04-28 | Payer: PRIVATE HEALTH INSURANCE

## 2022-04-28 DIAGNOSIS — Z23 Encounter for immunization: Principal | ICD-10-CM

## 2022-04-28 DIAGNOSIS — I10 Essential (primary) hypertension: Principal | ICD-10-CM

## 2022-04-28 DIAGNOSIS — Z944 Liver transplant status: Principal | ICD-10-CM

## 2022-04-28 DIAGNOSIS — Z6841 Body Mass Index (BMI) 40.0 and over, adult: Principal | ICD-10-CM

## 2022-04-28 DIAGNOSIS — E1165 Type 2 diabetes mellitus with hyperglycemia: Principal | ICD-10-CM

## 2022-04-28 DIAGNOSIS — E876 Hypokalemia: Principal | ICD-10-CM

## 2022-04-28 DIAGNOSIS — Z5181 Encounter for therapeutic drug level monitoring: Principal | ICD-10-CM

## 2022-04-28 DIAGNOSIS — E612 Magnesium deficiency: Principal | ICD-10-CM

## 2022-04-28 DIAGNOSIS — G4733 Obstructive sleep apnea (adult) (pediatric): Principal | ICD-10-CM

## 2022-04-28 DIAGNOSIS — I5032 Chronic diastolic (congestive) heart failure: Principal | ICD-10-CM

## 2022-04-28 DIAGNOSIS — Z794 Long term (current) use of insulin: Principal | ICD-10-CM

## 2022-04-28 LAB — RUBELLA ANTIBODY, IGG: RUBELLA IGG SCREEN: POSITIVE

## 2022-04-28 LAB — CBC W/ AUTO DIFF
BASOPHILS ABSOLUTE COUNT: 0.1 10*9/L (ref 0.0–0.1)
BASOPHILS RELATIVE PERCENT: 0.5 %
EOSINOPHILS ABSOLUTE COUNT: 0.5 10*9/L (ref 0.0–0.5)
EOSINOPHILS RELATIVE PERCENT: 5.3 %
HEMATOCRIT: 39.2 % (ref 39.0–48.0)
HEMOGLOBIN: 13.3 g/dL (ref 12.9–16.5)
LYMPHOCYTES ABSOLUTE COUNT: 1.5 10*9/L (ref 1.1–3.6)
LYMPHOCYTES RELATIVE PERCENT: 14.6 %
MEAN CORPUSCULAR HEMOGLOBIN CONC: 34 g/dL (ref 32.0–36.0)
MEAN CORPUSCULAR HEMOGLOBIN: 27.2 pg (ref 25.9–32.4)
MEAN CORPUSCULAR VOLUME: 80.1 fL (ref 77.6–95.7)
MEAN PLATELET VOLUME: 7.1 fL (ref 6.8–10.7)
MONOCYTES ABSOLUTE COUNT: 0.6 10*9/L (ref 0.3–0.8)
MONOCYTES RELATIVE PERCENT: 5.9 %
NEUTROPHILS ABSOLUTE COUNT: 7.5 10*9/L (ref 1.8–7.8)
NEUTROPHILS RELATIVE PERCENT: 73.7 %
NUCLEATED RED BLOOD CELLS: 0 /100{WBCs} (ref <=4)
PLATELET COUNT: 305 10*9/L (ref 150–450)
RED BLOOD CELL COUNT: 4.89 10*12/L (ref 4.26–5.60)
RED CELL DISTRIBUTION WIDTH: 13.7 % (ref 12.2–15.2)
WBC ADJUSTED: 10.2 10*9/L (ref 3.6–11.2)

## 2022-04-28 LAB — COMPREHENSIVE METABOLIC PANEL
ALBUMIN: 3.3 g/dL — ABNORMAL LOW (ref 3.4–5.0)
ALKALINE PHOSPHATASE: 121 U/L — ABNORMAL HIGH (ref 46–116)
ALT (SGPT): 36 U/L (ref 10–49)
ANION GAP: 11 mmol/L (ref 5–14)
AST (SGOT): 40 U/L — ABNORMAL HIGH (ref <=34)
BILIRUBIN TOTAL: 0.9 mg/dL (ref 0.3–1.2)
BLOOD UREA NITROGEN: 47 mg/dL — ABNORMAL HIGH (ref 9–23)
BUN / CREAT RATIO: 13
CALCIUM: 9.5 mg/dL (ref 8.7–10.4)
CHLORIDE: 99 mmol/L (ref 98–107)
CO2: 28.8 mmol/L (ref 20.0–31.0)
CREATININE: 3.74 mg/dL — ABNORMAL HIGH
EGFR CKD-EPI (2021) MALE: 18 mL/min/{1.73_m2} — ABNORMAL LOW (ref >=60)
GLUCOSE RANDOM: 155 mg/dL — ABNORMAL HIGH (ref 70–99)
POTASSIUM: 2.9 mmol/L — ABNORMAL LOW (ref 3.4–4.8)
PROTEIN TOTAL: 7.4 g/dL (ref 5.7–8.2)
SODIUM: 139 mmol/L (ref 135–145)

## 2022-04-28 LAB — TRANSPLANT IMMUNE STATUS - EBV: EPSTEIN-BARR VCA IGG ANTIBODY: POSITIVE — AB

## 2022-04-28 LAB — HSV ANTIBODIES, IGG
HERPES SIMPLEX VIRUS 1 IGG: NEGATIVE
HERPES SIMPLEX VIRUS 2 IGG: POSITIVE — AB
HSV 1 IGG OD: 0.422

## 2022-04-28 LAB — MAGNESIUM: MAGNESIUM: 1.9 mg/dL (ref 1.6–2.6)

## 2022-04-28 LAB — VARICELLA ZOSTER ANTIBODY, IGG: VARICELLA ZOSTER IGG: POSITIVE

## 2022-04-28 LAB — CMV IGG: CMV IGG: NEGATIVE

## 2022-04-28 LAB — TOXOPLASMA GONDII ANTIBODY, IGG: TOXOPLASMA GONDII IGG: NEGATIVE

## 2022-04-28 LAB — GAMMA GT: GAMMA GLUTAMYL TRANSFERASE: 107 U/L — ABNORMAL HIGH

## 2022-04-28 LAB — PHOSPHORUS: PHOSPHORUS: 4.8 mg/dL (ref 2.4–5.1)

## 2022-04-28 LAB — BILIRUBIN, DIRECT: BILIRUBIN DIRECT: 0.3 mg/dL (ref 0.00–0.30)

## 2022-04-28 MED ORDER — OZEMPIC 0.25 MG OR 0.5 MG (2 MG/3 ML) SUBCUTANEOUS PEN INJECTOR
SUBCUTANEOUS | 0 refills | 0 days | Status: CN
Start: 2022-04-28 — End: ?

## 2022-04-28 MED ORDER — INSULIN DETEMIR (U-100) 100 UNIT/ML (3 ML) SUBCUTANEOUS PEN
Freq: Every evening | SUBCUTANEOUS | 3 refills | 150 days | Status: CP
Start: 2022-04-28 — End: ?

## 2022-04-28 MED ORDER — SPIRONOLACTONE 25 MG TABLET
ORAL_TABLET | Freq: Every day | ORAL | 3 refills | 90.00000 days | Status: CN
Start: 2022-04-28 — End: 2023-04-28

## 2022-04-28 MED ORDER — POTASSIUM CHLORIDE ER 20 MEQ TABLET,EXTENDED RELEASE(PART/CRYST)
ORAL_TABLET | Freq: Every day | ORAL | 2 refills | 30.00000 days | Status: CN
Start: 2022-04-28 — End: 2022-04-28

## 2022-04-28 MED ORDER — INSULIN ASPART (U-100) 100 UNIT/ML (3 ML) SUBCUTANEOUS PEN
Freq: Three times a day (TID) | SUBCUTANEOUS | 0 refills | 13 days | Status: CP
Start: 2022-04-28 — End: ?

## 2022-04-28 MED ORDER — BAQSIMI 3 MG/ACTUATION NASAL SPRAY
Freq: Once | NASAL | 0 refills | 2 days | Status: CP | PRN
Start: 2022-04-28 — End: ?

## 2022-04-28 NOTE — Unmapped (Signed)
[facility] at [facility]     Type of visit: face to face    Are you located in [place]? (for virtual visits only)     Reason for visit: Follow up    Questions / Concerns that need to be addressed: Pt would like to discuss Ozempic dosage, insulin, blood sugar    Screening BP-     Omron BPs (complete if screening BP has a systolic  > 130 or diastolic > 80)  BP#1 180/106   BP#2 169/96  BP#3 173/103    Average BP 174/102  (please note this as a comment in vitals)     PTHomeBP     Diabetes:  Regularly checking blood sugars?: yes  If yes, when? Complete log for past 7 days  Date Before Breakfast After Breakfast Before Lunch After Lunch Before Dinner After Dinner Before Bed    04/27/2022            390                                                                                                                   HCDM reviewed and updated in Epic:    We are working to make sure all of our patients??? wishes are updated in Epic and part of that is documenting a Environmental health practitioner for each patient  A Health Care Decision Maker is someone you choose who can make health care decisions for you if you are not able - who would you most want to do this for you????  is already up to date.    HCDM (patient stated preference): [name] - [phone]    BPAs completed:      COVID-19 Vaccine Summary  Which COVID-19 Vaccine was administered  Type:  Dates Given:          Date last mAB infusion:  07/18/2019          If no: Are you interested in scheduling?     Immunization History   Administered Date(s) Administered    Hepatitis A Vaccine - Unspecified Formulation 10/06/2011, 03/30/2014    Hepatitis B vaccine, pediatric/adolescent dosage, 10/06/2011, 11/06/2011, 03/30/2014    INFLUENZA QUAD HIGH DOSE 80YRS+(FLUZONE) 04/08/2013    INFLUENZA TIV (TRI) 91MO+ W/ PRESERV (IM) 03/15/2011, 03/30/2014, 04/15/2015, 03/27/2016, 03/27/2017, 04/24/2018    Influenza Vaccine Quad (IIV4 PF) 6-72mo 04/15/2020 Influenza Vaccine Quad (IIV4 PF) 59mo+ injectable 04/15/2015, 03/27/2016, 03/27/2017, 04/24/2018    Influenza Virus Vaccine, unspecified formulation 07/03/2019    PNEUMOCOCCAL POLYSACCHARIDE 23-VALENT 06/22/2016, 03/27/2017    PPD Test 03/07/2012, 05/12/2012    Pneumococcal Conjugate 13-Valent 05/25/2011, 03/27/2016    SHINGRIX-ZOSTER VACCINE (HZV), RECOMBINANT,SUB-UNIT,ADJUVANTED IM 03/27/2017, 04/24/2018    TdaP 08/11/2008, 06/11/2016       __________________________________________________________________________________________    SCREENINGS COMPLETED IN FLOWSHEETS    HARK Screening       AUDIT       PHQ2       PHQ9  P4 Suicidality Screener                GAD7       COPD Assessment       Falls Risk

## 2022-04-28 NOTE — Unmapped (Addendum)
I'll reach out to Dr Galen Manila about BP and when to restart lasix    Stay at dose of 0.5 mg weekly Ozempic for 4 more weeks    If still above morning gluc of 130 after 3 days you can increase nightly dose of Levemir by 3 units, but only make this adjustment     Make a log book of blood sugars fasting and before bedtime each taken once a day and BRING THAT to next appointment

## 2022-04-28 NOTE — Unmapped (Addendum)
Patient's K remains <3 for third time in the last week. Patient seen by his pcp today, who was considering restarting his lasix. Discussed with Dr.Zeitler and included Dr.Duggans. Per the former, patient should start a daily KCl replacement and repeat labs in one week. He also suggested patient should remain off of his lasix unless he is swelling.    Spoke with patient, who reported he has been having a lot of swelling since he stopped the lasix, so his pcp planned to restart him today. He also mentioned having cramping to his LE when at rest.He denied any chest pain, palpitations, sob or dizziness. Discussed KCL supplement and sent script to his local pharmacy. He is aware that he should be seen urgently if these sx arise and agreed to repeat labs in 1 weeks.    Addendum: Drs.Duggan and Zeitler discussed potassium mgmt and agreed to restart spironolactone, instead of lasix, and to defer the K supplement. Dr.Duggan notified the patient and re-sent the spironolactone rx.

## 2022-04-28 NOTE — Unmapped (Signed)
Internal Medicine Initial Visit        Assessment/Plan:     Richard [name] presents today to establish care and for recent hospital follow-up, he was hospitalized for 3/5 to 3/9 for type II MI thought to be secondary to hypertension.  Past medical history of OLT, diabetes on insulin, hypertension, CKD.              No diagnosis found.      Diabetes mellitus, type 2   Last A1c 8.3%, 08/2021.  Doesn't check glucoses at home but then later reports checking before each meal and before bed. Last had a no episodes of hypoglycemia, knows to watch out for nausea, dizziness, feeling well, shaking. Dex com 2 mo ago. Wasn't' using anything to check in between then and 3/5 hospitalization. Dexcom helped a lot before. Numbers checked before meals and before bed between 150 to 225. Has been using sliding scale to give correctional based off that. Metformin contraindicated w/ eGFR ~28.   -Prescribed Dexcom: The patient has uncontrolled diabetes on 4 insulin injections per day  - continue aspart and degludec  -prescribed aspart insulin sliding scale TIDAC and insulin detemir 12 units nightly   - on Ozempic 0.5 mg weekly  - previously on     Hypertension   128/77 in clinic today. On amlodipine, hydralazine QID, might be taking Imdur and has recently filled prescription. no symptoms of low BP like Ortho hotn.  -Decrease amlodipine to 5 mg daily  -Increased daily Lasix from 20 to 40 mg  -Scheduled follow-up with enhanced care in 5 weeks  -Preferred not to start carvedilol before speaking with nephrologist, has upcoming appointment    CKD  Already has follow-up to establish at [facility] with Dr. Galen Manila with appointment and next couple months    Health maintenance  Trying to eat more natural foods. Right now exercising by walking every day 15-20. Will try 30 min per day 2-3x per week.  -Given follow-up on Mediterranean diet    Follow-up next visit:   CAD  No ASA he reports b/c of CKD. Not taking Atorvastatin. Would like to try lifestyle interventions first before starting a statin.     Toe infection, big toe left foot  Normal sensation in both feet on diabetic exam.  Not mentioned as a problem bothering him.  Can examine next visit.    Anxiety  Brought up having a lot of anxiety at the end of our visit and in the past taking lorazepam for that.  Not currently interested in counseling and held off on any prescriptions this visit.       Staffed with Dr. Shelva Majestic, seen and discussed    No follow-ups on file.   And also in 2 months with me for anxiety/diabetes      Chief Complaint:      Richard [name] is a 56 y.o. male who presents to Establish Care for diabetes, hypertension      Subjective:     HPI  Richard [name] is a 56 y.o. male with a PMHx of liver transplant (for NASH cirrhosis, on CellCept and tacrolimus), CKD 4, T2DM, HTN, who presented to [facility]/28/20[facility] with intermittent, increasingly frequent severe abdominal pain x2 weeks associated with chest tightness, and was found to have elevated blood pressures, troponin, and AST/ALT.    See pertinent problem-based history.  Other history: Does have nocturia and urinary frequency more prevalent at night.      The past medical history,  surgical history, family history, social history, medications and allergies were reviewed in Epic.     Review of Systems    The balance of 10 systems was reviewed and unremarkable except as stated above.        Objective:     There were no vitals taken for this visit.     General: No acute distress, obese male  HEENT: No thyroid nodules or lymphadenopathy  Eyes: Anicteric  Cardiovascular: Normal rate and rhythm, distant heart sounds  Pulmonary: Clear to auscultation bilaterally  Gastrointestinal: No abdominal pain, healed scar from liver transplant  Musculoskeletal: Able to climb to exam table without help or issue  Skin: No rashes on clothed exam  Back: No CVA tenderness  Extremities: +2 pitting edema nearly to knees      Records Review completed    PHQ-9 Score:     GAD-7 Score:         Medication adherence and barriers to the treatment plan have been addressed. Opportunities to optimize healthy behaviors have been discussed. Patient / caregiver voiced understanding.

## 2022-04-28 NOTE — Unmapped (Signed)
Internal Medicine Clinic Visit    Reason for visit: Follow-up of diabetes    A/P:         1. Type 2 diabetes mellitus with hyperglycemia, with long-term current use of insulin (CMS-HCC)    2. Need for immunization against influenza    3. Primary hypertension    4. Morbid obesity with BMI of 40.0-44.9, adult (CMS-HCC)    5. Chronic heart failure with preserved ejection fraction (CMS-HCC)    6. Obstructive sleep apnea syndrome        Type 2 diabetes mellitus with hyperglycemia, with long-term current use of insulin (CMS-HCC)  Overview:  Last A1c 10.5%, April 24, 2022.  Wears a Dexcom but does not know how to check data beyond 12 to 24 hours allow has that on his phone.  Reports never really having been hypoglycemic so has low awareness of this, has been counseled in past.  He takes 45 units of aspart before each meal eating 2-3 daily.  He takes 12 units detemir at night.  He takes Ozempic 0.5 mg weekly injections but only resume this 1 month ago after he had self discontinued for almost a year (has chronic abdominal pain/nausea not worse while on Ozempic).  Metformin contraindicated w/ eGFR ~28.     Assessment & Plan:  A: Poorly controlled w/ extremely limited insight into his disease. I expressed my concern that w/ out consistent follow up this will remain poorly controlled and w/ him continuing to self adjust his insulin/other meds he is at risk for stroke.   P: dec Aspart, inc Detemir at night to move towards 50:50 basal bolus (20 units nightly closer to w/ in 0.1-0.2 units/kg range for him), enh care DM in 2-3 weeks and me again in 5 weeks, cont Ozempic x4 more weeks so as not to cause worsening abdom pain w/ too fast uptitration  -Next visit/at enhanced care diabetes: Plan to increase Ozempic dose approximately 4 weeks from now from 0.5 to 1 mg weekly injection      Orders:  -     insulin aspart U-100; Inject 0.4 mL (40 Units total) under the skin Three (3) times a day before meals. Take the [CORRECTIONAL] Correction insulin scale based on Insulin sensitivity factor 20: BG 150-169 = 1 unit BG 170-189 = 2 units BG 190-209 = 3 units BG 210-229 = 4 units BG 230-249 = 5 units BG 250-269 = 6 units BG 270-289 = 7 units BG 290-300 = 8 units BG > 300 = 9 units and notify provider.  Zero refills permitted. Patient aware to see provider.  -     insulin detemir U-100; Inject 0.2 mL (20 Units total) under the skin nightly.  -     Baqsimi; Use 1 spray into the left nostril once as needed for up to 1 dose.    Need for immunization against influenza  -     INFLUENZA VACCINE (QUAD) IM - 6 MO-ADULT - PF    Primary hypertension  Overview:  Amlodipine 5, holding furosemide 40 With hypokalemia; self discontinued Cleda Daub 25 and valsart 80    Assessment & Plan:  A- poorly controlled w/ in office 170-180/100-110 though reports better control at home though can't recall those pressures  P- after discussion w/ Nephrologist Dr Galen Manila will resume Spironolactone and hold off on any potassium supplement (called pt to confirm this plan)  - all further recs per his team       Morbid obesity with BMI of 40.0-44.9,  adult (CMS-HCC)  Assessment & Plan:  - see diabetes         Chronic heart failure with preserved ejection fraction (CMS-HCC)  Overview:  Hospitalization requiring IV diuresis, no O2 requirement, discharged 10/26/2021. Has LE swelling w/o taking Lasix as of 04/2022      Obstructive sleep apnea syndrome  Assessment & Plan:  MUST assess CPAP use next visit given HTN/obesity                 Return in about 5 weeks (around 06/02/2022), or and schedule 2-3 weeks with CPP for Diabetes enhanced care.    Staffed with Dr. Lennie Muckle, seen and discussed    __________________________________________________________    HPI:    Stopped taking spironolactone in past b/c his BP was well controlled on amlodipine + Lasix. Checks BP every couple days at home at varying times.     Abdo pain is sometimes outrageous, triggered by a lot of exertion, warm showers or if he gets upset. Eating or after eating doesn't worsen abdominal pain. So severe he vomits, pain is below pannus hypogastric area. Had stopped Ozempic since last summer and then restarted 1 mo ago. Stopped due to pricing he thinks?     Has never had low blood sugar. Then says once or twice he gets to 105 if he injects short acting and then doesn't eat. Gets a little hungry. Taking Novalog during day with meals, taking 45 units with each meal, eating 2-3 units per day. Wears a dexcom but doesn't know how to review data. Fasting AM glucoses are 250-260s. At night before he goes to sleep he is usually 300-400+.     Recently seen by Nephrology for CKD had hypokalemia/thought to be dehydrated from Lasix then so that was stopped. Also seen by Transplant Neph for kidney tplt eval.   __________________________________________________________        Medications:  Reviewed in EPIC  __________________________________________________________    Physical Exam:   Vital Signs:  Vitals:    04/28/22 0855   BP Site: L Arm   BP Position: Sitting   Resp: 16   Temp: 36.7 ??C (98.1 ??F)   TempSrc: Oral   SpO2: 99%   Weight: (!) 130.9 kg (288 lb 9.6 oz)   Height: 174.5 cm (5' 8.7)          PTHomeBP    The patient???s Average Home Blood Pressure during the last two weeks is :   /   based on  readings            Gen: anxious, NAD  CV: distant heart sounds, no murmurs  Pulm: CTA bilaterally, no crackles or wheezes but poor air mvmt  Abd: epigastric tenderness, obese, normal BS.   Ext: +1 LE edema      PHQ-9 Score:     GAD-7 Score:       Medication adherence and barriers to the treatment plan have been addressed. Opportunities to optimize healthy behaviors have been discussed. Patient / caregiver voiced understanding.

## 2022-04-29 LAB — TACROLIMUS LEVEL, TROUGH: TACROLIMUS, TROUGH: 2.9 ng/mL — ABNORMAL LOW (ref 5.0–15.0)

## 2022-04-29 NOTE — Unmapped (Addendum)
A: Poorly controlled w/ extremely limited insight into his disease. I expressed my concern that w/ out consistent follow up this will remain poorly controlled and w/ him continuing to self adjust his insulin/other meds he is at risk for stroke.   P: dec Aspart, inc Detemir at night to move towards 50:50 basal bolus (20 units nightly closer to w/ in 0.1-0.2 units/kg range for him), enh care DM in 2-3 weeks and me again in 5 weeks, cont Ozempic x4 more weeks so as not to cause worsening abdom pain w/ too fast uptitration  -Next visit/at enhanced care diabetes: Plan to increase Ozempic dose approximately 4 weeks from now from 0.5 to 1 mg weekly injection

## 2022-04-29 NOTE — Unmapped (Signed)
MUST assess CPAP use next visit given HTN/obesity

## 2022-04-29 NOTE — Unmapped (Addendum)
A- poorly controlled w/ in office 170-180/100-110 though reports better control at home though can't recall those pressures  P- after discussion w/ Nephrologist Dr Galen Manila will resume Spironolactone and hold off on any potassium supplement (called pt to confirm this plan)  - all further recs per his team

## 2022-04-29 NOTE — Unmapped (Signed)
-  see diabetes

## 2022-05-01 DIAGNOSIS — Z944 Liver transplant status: Principal | ICD-10-CM

## 2022-05-01 LAB — HLA CL I&II, LOW RES
BW #1: 6
BW #2: 6
LOW RES DRW #1: 52
LOW RES DRW #2: 52
LOW RES HLA A #1: 24
LOW RES HLA A #2: 30
LOW RES HLA B #1: 72
LOW RES HLA B #2: 35
LOW RES HLA C #1: 2
LOW RES HLA C #2: 4
LOW RES HLA DQ#1: 2
LOW RES HLA DQ#2: 7
LOW RES HLA DR#1: 17
LOW RES HLA DR#2: 11

## 2022-05-01 LAB — VITAMIN D 25 HYDROXY: VITAMIN D, TOTAL (25OH): 19.4 ng/mL — ABNORMAL LOW (ref 20.0–80.0)

## 2022-05-01 LAB — HLA ANTIBODY SCREEN C2: HLA C2 AB SCR: POSITIVE

## 2022-05-01 LAB — HLA ANTIBODY SCREEN C1: HLA C1 AB SCR: POSITIVE

## 2022-05-01 MED ORDER — MYCOPHENOLATE MOFETIL 250 MG CAPSULE
ORAL_CAPSULE | Freq: Two times a day (BID) | ORAL | 11 refills | 30 days
Start: 2022-05-01 — End: ?

## 2022-05-01 MED ORDER — TACROLIMUS 0.5 MG CAPSULE, IMMEDIATE-RELEASE
ORAL_CAPSULE | Freq: Two times a day (BID) | ORAL | 3 refills | 90 days | Status: CP
Start: 2022-05-01 — End: ?
  Filled 2022-05-03: qty 120, 30d supply, fill #0

## 2022-05-01 NOTE — Unmapped (Signed)
Received request from [facility] for cellcept, since rx was discontinued on 10/30 at his kidney clinic visit. No notation see for switching. Left VM request for patient to return call to [facility] for details of which mycophenolate formulation he is on. (He was originally switched to cellcept since he was no longer eligible for myfortic assistance.)

## 2022-05-01 NOTE — Unmapped (Signed)
[facility] Refill Coordination Note    Specialty Medication(s) to be Shipped:   Transplant: mycophenolate mofetil 250mg  and Prograf 0.5mg     Other medication(s) to be shipped: No additional medications requested for fill at this time     Richard [name], DOB: 1966/02/09  Phone: [phone] (home)       All above HIPAA information was verified with patient.     Was a Nurse, learning disability used for this call? No    Completed refill call assessment today to schedule patient's medication shipment from the [facility] [phone]).  All relevant notes have been reviewed.     Specialty medication(s) and dose(s) confirmed: Regimen is correct and unchanged.   Changes to medications: Richard [name] reports no changes at this time.  Changes to insurance: No  New side effects reported not previously addressed with a pharmacist or physician: None reported  Questions for the pharmacist: No    Confirmed patient received a Conservation officer, historic buildings and a Surveyor, mining with first shipment. The patient will receive a drug information handout for each medication shipped and additional FDA Medication Guides as required.       DISEASE/MEDICATION-SPECIFIC INFORMATION        N/A    SPECIALTY MEDICATION ADHERENCE     Medication Adherence    Patient reported X missed doses in the last month: 0  Specialty Medication: Prograf 0.25 mg  Patient is on additional specialty medications: Yes  Additional Specialty Medications: Mycophenolate 250 mg   Patient Reported Additional Medication X Missed Doses in the Last Month: 0  Patient is on more than two specialty medications: No      Adherence tools used: patient uses a pill box to manage medications                          Were doses missed due to medication being on hold? No    mycophenolate 250  mg: 7 days of medicine on hand   PROGRAF 0.5 mg 7 days of medicine on hand       REFERRAL TO PHARMACIST     Referral to the pharmacist: Not needed      [facility]     Shipping address confirmed in Epic.     Delivery Scheduled: Yes, Expected medication delivery date: 05/04/22.     Medication will be delivered via UPS to the prescription address in Epic WAM.    Richard [name]   [facility] Specialty Technician

## 2022-05-01 NOTE — Unmapped (Signed)
The patient is requesting a medication refill

## 2022-05-01 NOTE — Unmapped (Signed)
-----   Message from Gemma Payor, MD sent at 04/27/2022 12:27 PM EDT -----  Hi  CT from March 2023 acceptable B/L; repeat in 3 years.  ----- Message -----  From: Durenda Age, RN  Sent: 04/27/2022  11:43 AM EDT  To: Gemma Payor, MD; Edrick Kins, MD    08/29/2021 CT for targets and follow up. Patient is post liver transplant, DM, most recent A1C 10.5, BMI 42.3

## 2022-05-02 DIAGNOSIS — Z944 Liver transplant status: Principal | ICD-10-CM

## 2022-05-02 DIAGNOSIS — Z796 Long-term use of immunosuppressant medication: Principal | ICD-10-CM

## 2022-05-02 MED ORDER — MYCOPHENOLATE MOFETIL 250 MG CAPSULE
ORAL_CAPSULE | Freq: Two times a day (BID) | ORAL | 11 refills | 30 days
Start: 2022-05-02 — End: ?

## 2022-05-02 MED ORDER — INSULIN ASPART (U-100) 100 UNIT/ML (3 ML) SUBCUTANEOUS PEN
Freq: Three times a day (TID) | SUBCUTANEOUS | 0 refills | 13 days | Status: CP
Start: 2022-05-02 — End: ?

## 2022-05-02 NOTE — Unmapped (Addendum)
[facility] Pharmacist has reviewed a new prescription for tacrolimus that indicates a dose increase.  Patient was counseled on this dosage change by coordinator KW- see epic note from 10/31.  New rx already added into existing work order by other teammate      Clinical Assessment Needed For: Dose Change  Medication: Prograf 0.5mg  capsule  Last Fill Date/Day Supply: 04/04/2022 / 30 days  Copay $0  Was previous dose already scheduled to fill: Yes    Notes to Pharmacist: Scheduled to fill 11/08

## 2022-05-02 NOTE — Unmapped (Signed)
Called [facility] they did not receive prescription sent 04/28/22. New prescription transcribed.

## 2022-05-05 DIAGNOSIS — E1165 Type 2 diabetes mellitus with hyperglycemia: Principal | ICD-10-CM

## 2022-05-05 DIAGNOSIS — Z794 Long term (current) use of insulin: Principal | ICD-10-CM

## 2022-05-05 MED ORDER — INSULIN ASPART (U-100) 100 UNIT/ML (3 ML) SUBCUTANEOUS PEN
Freq: Three times a day (TID) | SUBCUTANEOUS | 3 refills | 60.00000 days | Status: CP
Start: 2022-05-05 — End: 2022-05-05
  Filled 2022-05-09: qty 60, 50d supply, fill #0

## 2022-05-06 MED ORDER — OZEMPIC 0.25 MG OR 0.5 MG (2 MG/3 ML) SUBCUTANEOUS PEN INJECTOR
0 refills | 0.00000 days
Start: 2022-05-06 — End: ?

## 2022-05-06 NOTE — Unmapped (Signed)
Called pt and we discussed plan.  I am going to send his prescription to another pharmacy with the hope that his insurance will cover this medication then as he has had trouble getting it covered through the original pharmacy when I saw him and placed the prescription in clinic.  He is to try and pick it up at the [facility] pharmacy, and will call them, and if he is still quoted a price that is not covered by his insurance he will contact our clinic and let us know.    I repeated my prescription of insulin aspart AKA NovoLog FlexPen 100 units/mL 3 mL injections dosed at 0.4 mL under the skin 3 times a day before meals    Dispense 72 mL, 3 refills to [facility] outpatient pharmacy at [address], [place], [place] [address]      He needs this insulin for type 2 diabetes on long-term insulin and is otherwise at risk for severe hyperglycemia and DKA

## 2022-05-08 MED ORDER — OZEMPIC 0.25 MG OR 0.5 MG (2 MG/3 ML) SUBCUTANEOUS PEN INJECTOR
SUBCUTANEOUS | 0 refills | 0.00000 days | Status: CP
Start: 2022-05-08 — End: 2022-07-03

## 2022-05-08 NOTE — Unmapped (Signed)
[facility] (CAMP)         DIABETES RECRUITMENT SUMMARY NOTE     Patient was outreached for CAMP Services. Patient declined. Patient will look over the letter and reach out if interested.      Alberteen Sam, CPhT   Certified Pharmacy Technician   [facility] (CAMP)   P [phone], F [phone]

## 2022-05-11 MED ORDER — OZEMPIC 1 MG/DOSE (4 MG/3 ML) SUBCUTANEOUS PEN INJECTOR
SUBCUTANEOUS | 0 refills | 28 days | Status: CP
Start: 2022-05-11 — End: 2022-03-16

## 2022-05-15 LAB — FSAB CLASS 2 ANTIBODY SPECIFICITY: HLA CL2 AB RESULT: POSITIVE

## 2022-05-15 LAB — FSAB CLASS 1 ANTIBODY SPECIFICITY
CPRA%: 0
HLA CLASS 1 ANTIBODY RESULT: NEGATIVE

## 2022-05-17 NOTE — Unmapped (Signed)
Patient's 10/30 Vit D resulted low. Reviewed by PharmD Sherrilyn Rist, who recommended clarifying if he was still on a Vit D supplement of 2000iu, adding that if he was currently taking that dose, then he should double it to 4000iu, but if he had not been taking anything, then he should begin taking 2000iu. Spoke with patient, who said he had not been taking anything. Relayed recommendation to begin 2000iu daily of Vit D3. Patient verbalized understanding and agreed to discuss f/up with his pcp on his level in 6-12 mos.

## 2022-05-20 ENCOUNTER — Ambulatory Visit
Admit: 2022-05-20 | Discharge: 2022-05-21 | Payer: PRIVATE HEALTH INSURANCE | Attending: Emergency Medicine | Primary: Emergency Medicine

## 2022-05-20 DIAGNOSIS — H6121 Impacted cerumen, right ear: Principal | ICD-10-CM

## 2022-05-21 NOTE — Unmapped (Addendum)
It was a pleasure taking care of you today!    If this happens again, can consider prevention, but ears are clear now and no infection!

## 2022-05-21 NOTE — Unmapped (Signed)
[facility]      [address], [place], [place]  Subjective:     Triage Notes:  Chief Complaint   Patient presents with    Otalgia     Right ear pain. Thinks something is in the ear.        HPI: Richard [name] is a pleasant 56 y.o. male with type 2 diabetes on insulin (A1c 10.5% 03/2022)), OSA, HTN, elevated BMI, HFpEF (EF equals 55 to 60% 08/2021) CKD (eGFR 18 in 04/2022), s/p liver transplant on immunosuppression here for right otalgia.    The patient reports discomfort in the right ear over the last 2 to 3 days.  Some intermittent difficulty hearing.  Denies any fevers, upper respiratory symptoms, ears are not popping.  Feels like something is stuck in there.  No history of cerumen impactions.    I have reviewed past medical, surgical, medications, allergies, social and family histories today.      Current Outpatient Medications:     amLODIPine (NORVASC) 5 MG tablet, Take 1 tablet (5 mg total) by mouth daily., Disp: 90 tablet, Rfl: 3    blood sugar diagnostic (CONTOUR NEXT TEST STRIPS) Strp, Use to check blood sugar as directed with insulin 4 times a day & for symptoms of high or low blood sugar., Disp: 100 each, Rfl: 0    blood-glucose meter Misc, USE AS DIRECTED, Disp: 1 each, Rfl: 0    blood-glucose meter,continuous (DEXCOM G6 RECEIVER) Misc, 1 each by Miscellaneous route in the morning. Dispense 1 receiver annually.  Sent to [facility]., Disp: 1 each, Rfl: 0    blood-glucose sensor (DEXCOM G6 SENSOR) Devi, Use as directed to monitor blood glucose, Disp: 3 each, Rfl: 0    blood-glucose transmitter (DEXCOM G6 TRANSMITTER) Devi, Use as directed to monitor blood glucose, Disp: 1 each, Rfl: 0    CHOLECALCIFEROL, VITAMIN D3, (VITAMIN D3 ORAL), Take 2,000 Units by mouth daily., Disp: , Rfl:     glucagon spray (BAQSIMI) 3 mg/actuation Spry, Use 1 spray into the left nostril once as needed for up to 1 dose., Disp: 2 each, Rfl: 0    insulin aspart (NOVOLOG FLEXPEN) 100 unit/mL (3 mL) injection pen, Inject 0.4 mL (40 Units total) under the skin Three (3) times a day before meals., Disp: 60 mL, Rfl: 3    insulin detemir U-100 (LEVEMIR) 100 unit/mL (3 mL) injection pen, Inject 0.2 mL (20 Units total) under the skin nightly., Disp: 30 mL, Rfl: 3    lancets Misc, Use to check blood sugar as directed with insulin 4 times a day & for symptoms of high or low blood sugar., Disp: 100 each, Rfl: 0    magnesium oxide (MAG-OX) 400 mg (241.3 mg elemental magnesium) tablet, Take 1 tablet (400 mg total) by mouth., Disp: , Rfl:     multivitamin (TAB-A-VITE/THERAGRAN) per tablet, Take 1 tablet by mouth daily., Disp: , Rfl:     mycophenolate (CELLCEPT) 250 mg capsule, Take 1 capsule (250 mg total) by mouth two (2) times a day., Disp: 60 capsule, Rfl: 11    pen needle, diabetic 32 gauge x 5/32 (4 mm) Ndle, Use with insulin up to 4 times a day as needed., Disp: 100 each, Rfl: 0    semaglutide (OZEMPIC) 0.25 mg or 0.5 mg (2 mg/3 mL) PnIj, 0.5 mg by abdominal subcutaneous route once a week., Disp: 3 mL, Rfl: 1    spironolactone (ALDACTONE) 25 MG tablet, Take 1 tablet (25 mg total) by mouth  daily., Disp: 90 tablet, Rfl: 3    tacrolimus (PROGRAF) 0.5 MG capsule, Take 2 capsules (1 mg total) by mouth two (2) times a day., Disp: 360 capsule, Rfl: 3    blood-glucose sensor (DEXCOM G6 SENSOR) Devi, 1 each by Miscellaneous route in the morning. [facility]., Disp: 9 each, Rfl: 3    blood-glucose transmitter (DEXCOM G6 TRANSMITTER) Devi, 1 each by Miscellaneous route Every three (3) months. [facility] pharmacy, Disp: 3 each, Rfl: 4    Current Facility-Administered Medications:     amLODIPine (NORVASC) tablet 5 mg, 5 mg, Oral, Once, [name], MD     ROS:     Review of Systems    Review of systems negative unless otherwise noted as per HPI.    Objective:     Vitals:    05/20/22 1706   BP: 162/100   Pulse: 80   Resp: 18   Temp: 36.8 ??C (98.3 ??F)   TempSrc: Temporal   SpO2: 98%   Weight: (!) 127 kg (280 lb)   Height: 177.8 cm (5' 10)     BP Readings from Last 3 Encounters:   05/20/22 162/100   04/28/22 170/100   04/24/22 134/69         Body mass index is 40.18 kg/m??.    Physical Exam  Vitals reviewed.   Constitutional:       General: He is not in acute distress.     Appearance: Normal appearance. He is not ill-appearing or diaphoretic.   HENT:      Head: Normocephalic and atraumatic.      Right Ear: Tympanic membrane, ear canal and external ear normal. There is impacted cerumen.      Left Ear: Tympanic membrane, ear canal and external ear normal. There is no impacted cerumen.   Eyes:      General: No scleral icterus.     Conjunctiva/sclera: Conjunctivae normal.   Cardiovascular:      Rate and Rhythm: Normal rate.   Pulmonary:      Effort: Pulmonary effort is normal. No respiratory distress.   Skin:     General: Skin is warm and dry.   Neurological:      General: No focal deficit present.      Mental Status: He is alert and oriented to person, place, and time.   Psychiatric:         Mood and Affect: Mood normal.         Behavior: Behavior normal.            Assessment and Plan:     Differential Diagnosis: Differential diagnosis includes, but is not limited to: otitis media, (malignant) otitis externa, cerumen impaction, otomycosis, cellulitis, Ramsey-Hunt syndrome, foreign body, ET tube dysfunction, cholesteatoma, hemotympanum    Right EAC with impacted cerumen.  Completely removed by me and 1 attempt with iris forceps.  Upon reexamination, EAC and TM are clear.  No signs of infection bilaterally.  As this is the first instance, does not need any prophylaxis.  Follow-up as needed.    Richard [name] was seen today for otalgia.    Diagnoses and all orders for this visit:    Hearing loss due to cerumen impaction, right         Follow-up with [facility] PCP      Results    No results found for this visit on 05/20/22.        Tobacco Use: Medium Risk (05/20/2022)    Patient History  Smoking Tobacco Use: Former     Smokeless Tobacco Use: Never     Passive Exposure: Past     No SDOH tobacco intervention needed today.    Discussed my evaluation, clinical impression, and discharge plan. Also discussed anticipatory guidance and return precautions. Patient expressed understanding, has no questions or concerns at this time.       Note - This record has been created using [employer]. Chart creation errors have been sought, but may not always have been located. Such creation errors do not reflect on the standard of medical care.    Roylene Reason, PA-C  [facility]

## 2022-05-22 ENCOUNTER — Ambulatory Visit
Admit: 2022-05-22 | Discharge: 2022-05-22 | Payer: PRIVATE HEALTH INSURANCE | Attending: Pharmacist | Primary: Pharmacist

## 2022-05-22 ENCOUNTER — Ambulatory Visit: Admit: 2022-05-22 | Discharge: 2022-05-22 | Payer: PRIVATE HEALTH INSURANCE

## 2022-05-22 DIAGNOSIS — Z794 Long term (current) use of insulin: Principal | ICD-10-CM

## 2022-05-22 DIAGNOSIS — N184 Chronic kidney disease, stage 4 (severe): Principal | ICD-10-CM

## 2022-05-22 DIAGNOSIS — Z5181 Encounter for therapeutic drug level monitoring: Principal | ICD-10-CM

## 2022-05-22 DIAGNOSIS — E1165 Type 2 diabetes mellitus with hyperglycemia: Principal | ICD-10-CM

## 2022-05-22 DIAGNOSIS — Z944 Liver transplant status: Principal | ICD-10-CM

## 2022-05-22 DIAGNOSIS — I21A1 Myocardial infarction type 2: Principal | ICD-10-CM

## 2022-05-22 DIAGNOSIS — Z6841 Body Mass Index (BMI) 40.0 and over, adult: Principal | ICD-10-CM

## 2022-05-22 DIAGNOSIS — I2510 Atherosclerotic heart disease of native coronary artery without angina pectoris: Principal | ICD-10-CM

## 2022-05-22 DIAGNOSIS — E612 Magnesium deficiency: Principal | ICD-10-CM

## 2022-05-22 DIAGNOSIS — I10 Essential (primary) hypertension: Principal | ICD-10-CM

## 2022-05-22 LAB — COMPREHENSIVE METABOLIC PANEL
ALBUMIN: 3.6 g/dL (ref 3.4–5.0)
ALKALINE PHOSPHATASE: 136 U/L — ABNORMAL HIGH (ref 46–116)
ALT (SGPT): 44 U/L (ref 10–49)
ANION GAP: 14 mmol/L (ref 5–14)
AST (SGOT): 31 U/L (ref <=34)
BILIRUBIN TOTAL: 0.9 mg/dL (ref 0.3–1.2)
BLOOD UREA NITROGEN: 49 mg/dL — ABNORMAL HIGH (ref 9–23)
BUN / CREAT RATIO: 10
CALCIUM: 9.7 mg/dL (ref 8.7–10.4)
CHLORIDE: 99 mmol/L (ref 98–107)
CO2: 24.9 mmol/L (ref 20.0–31.0)
CREATININE: 4.72 mg/dL — ABNORMAL HIGH
EGFR CKD-EPI (2021) MALE: 14 mL/min/{1.73_m2} — ABNORMAL LOW (ref >=60)
GLUCOSE RANDOM: 302 mg/dL — ABNORMAL HIGH (ref 70–99)
POTASSIUM: 3.2 mmol/L — ABNORMAL LOW (ref 3.4–4.8)
PROTEIN TOTAL: 7.6 g/dL (ref 5.7–8.2)
SODIUM: 138 mmol/L (ref 135–145)

## 2022-05-22 LAB — PHOSPHORUS: PHOSPHORUS: 4.7 mg/dL (ref 2.4–5.1)

## 2022-05-22 LAB — CBC W/ AUTO DIFF
BASOPHILS ABSOLUTE COUNT: 0.1 10*9/L (ref 0.0–0.1)
BASOPHILS RELATIVE PERCENT: 0.6 %
EOSINOPHILS ABSOLUTE COUNT: 0.4 10*9/L (ref 0.0–0.5)
EOSINOPHILS RELATIVE PERCENT: 4.6 %
HEMATOCRIT: 40.6 % (ref 39.0–48.0)
HEMOGLOBIN: 14 g/dL (ref 12.9–16.5)
LYMPHOCYTES ABSOLUTE COUNT: 1.5 10*9/L (ref 1.1–3.6)
LYMPHOCYTES RELATIVE PERCENT: 15.5 %
MEAN CORPUSCULAR HEMOGLOBIN CONC: 34.5 g/dL (ref 32.0–36.0)
MEAN CORPUSCULAR HEMOGLOBIN: 27.3 pg (ref 25.9–32.4)
MEAN CORPUSCULAR VOLUME: 79.2 fL (ref 77.6–95.7)
MEAN PLATELET VOLUME: 7.3 fL (ref 6.8–10.7)
MONOCYTES ABSOLUTE COUNT: 0.7 10*9/L (ref 0.3–0.8)
MONOCYTES RELATIVE PERCENT: 7.4 %
NEUTROPHILS ABSOLUTE COUNT: 7 10*9/L (ref 1.8–7.8)
NEUTROPHILS RELATIVE PERCENT: 71.9 %
NUCLEATED RED BLOOD CELLS: 0 /100{WBCs} (ref <=4)
PLATELET COUNT: 280 10*9/L (ref 150–450)
RED BLOOD CELL COUNT: 5.13 10*12/L (ref 4.26–5.60)
RED CELL DISTRIBUTION WIDTH: 13.8 % (ref 12.2–15.2)
WBC ADJUSTED: 9.8 10*9/L (ref 3.6–11.2)

## 2022-05-22 LAB — GAMMA GT: GAMMA GLUTAMYL TRANSFERASE: 182 U/L — ABNORMAL HIGH

## 2022-05-22 LAB — MAGNESIUM: MAGNESIUM: 1.9 mg/dL (ref 1.6–2.6)

## 2022-05-22 LAB — BILIRUBIN, DIRECT: BILIRUBIN DIRECT: 0.3 mg/dL (ref 0.00–0.30)

## 2022-05-22 MED ORDER — OZEMPIC 1 MG/DOSE (4 MG/3 ML) SUBCUTANEOUS PEN INJECTOR
SUBCUTANEOUS | 2 refills | 28 days | Status: CP
Start: 2022-05-22 — End: ?
  Filled 2022-05-28: qty 3, 28d supply, fill #0

## 2022-05-22 NOTE — Unmapped (Signed)
[facility] at [facility]     Type of visit: face to face    Are you located in [place]? (for virtual visits only) N/A    Reason for visit: HTN     Questions / Concerns that need to be addressed:     Screening BP     [employer]ron BPs (complete if screening BP has a systolic  > 130 or diastolic > 80)  BP#1 158/76 P 81   BP#2 145/106 P 82  BP#3 161/95 P 74    Average BP 155/92 P 92  (please note this as a comment in vitals)     PTHomeBP     Diabetes:  Regularly checking blood sugars?: no  If yes, when? Complete log for past 7 days  Date Before Breakfast After Breakfast Before Lunch After Lunch Before Dinner After Dinner Before Bed    05/22/22  270                                                                                                                             HCDM reviewed and updated in Epic:    We are working to make sure all of our patients??? wishes are updated in Epic and part of that is documenting a Environmental health practitioner for each patient  A Health Care Decision Maker is someone you choose who can make health care decisions for you if you are not able - who would you most want to do this for you????  was updated.    HCDM (patient stated preference): Richard [name] Sister - [phone]    BPAs completed:      COVID-19 Vaccine Summary  Which COVID-19 Vaccine was administered  Type:  Dates Given:          Date last mAB infusion:  07/18/2019          Immunization History   Administered Date(s) Administered    Hepatitis A Vaccine - Unspecified Formulation 10/06/2011, 03/30/2014    Hepatitis B vaccine, pediatric/adolescent dosage, 10/06/2011, 11/06/2011, 03/30/2014    INFLUENZA QUAD HIGH DOSE 53YRS+(FLUZONE) 04/08/2013    INFLUENZA TIV (TRI) 58MO+ W/ PRESERV (IM) 03/15/2011, 03/30/2014, 04/15/2015, 03/27/2016, 03/27/2017, 04/24/2018    Influenza Vaccine Quad (IIV4 PF) 6-47mo 04/15/2020    Influenza Vaccine Quad (IIV4 PF) 80mo+ injectable 04/15/2015, 03/27/2016, 03/27/2017, 04/24/2018, 04/28/2022    Influenza Virus Vaccine, unspecified formulation 07/03/2019    PNEUMOCOCCAL POLYSACCHARIDE 23-VALENT 06/22/2016, 03/27/2017    PPD Test 03/07/2012, 05/12/2012    Pneumococcal Conjugate 13-Valent 05/25/2011, 03/27/2016    SHINGRIX-ZOSTER VACCINE (HZV), RECOMBINANT,SUB-UNIT,ADJUVANTED IM 03/27/2017, 04/24/2018    TdaP 08/11/2008, 06/11/2016       __________________________________________________________________________________________    SCREENINGS COMPLETED IN FLOWSHEETS    HARK Screening       AUDIT       PHQ2       PHQ9          P4 Suicidality Screener  GAD7       COPD Assessment       Falls Risk

## 2022-05-22 NOTE — Unmapped (Addendum)
Take ozempic 0.5mg  this Saturday to finish off your current pen.  Then increase ozempic to 1mg  starting next Saturday    Increase detemir (Levemir) insulin to 40 units at bedtime    Increase aspart (Novolog) insulin to 50 units with meals

## 2022-05-22 NOTE — Unmapped (Signed)
Internal Medicine Enhanced Care    HYPERTENSION    ASSESSMENT AND PLAN:  1. Type 2 MI (myocardial infarction) (CMS-HCC)    2. Type 2 diabetes mellitus with hyperglycemia, with long-term current use of insulin (CMS-HCC)    3. Primary hypertension    4. Coronary artery disease, unspecified vessel or lesion type, unspecified whether angina present, unspecified whether native or transplanted heart    5. CKD (chronic kidney disease) stage 4, GFR 15-29 ml/min (CMS-HCC)    6. Morbid obesity with BMI of 40.0-44.9, adult (CMS-HCC)        Hypertension not well-controlled  ??? BP not at goal. Goal <130/80  ??? Currently taking spironolactone 25mg  every morning, amlodipine 5mg  daily  ??? Not checking at home  ??? Plan: check HTN3 panel with recent resumption of spiro - K improved but remains low at 3.2. Given low K and bump in SCr, will continue spironolactone 25mg  daily, increase amlodipine 5mg  to 10mg  daily.  Followup in 1 week with repeat labs and bp check and need for additional bp lowering with increase in amlodipine - Discussed and agreed upon by Dr Dietrich Pates   ??? Exercise: discussed goal of 30 minutes over the course of the day, most days of the week      CV risk reduction:  The ASCVD Risk score (Arnett DK, et al., 2019) failed to calculate.  Not currently taking atorva. Wanted to try lifestyle at 11/3 appt  ??? Plan: patient wishes to continue lifestyle for now. Will continue to revisit    DM:  Ozempic 0.5mg  weekly x couple months; 1 pen remaining; detemir 35 units q night around 10pm; aspart 45 units per meal - 2 meals daily plus breakfast.  2 hrs after meals 300s consistently; 200-300 fasting. No s/s lows  Plan: increase detemir to 40 units nightly; increase aspart to 50 units with meals, increase ozempic 1mg  weekly starting next Saturday 12/9. Minimize OJ.    Follow-up: 05/30/22    Future Appointments   Date Time Provider Department Center   06/09/2022  9:00 AM Mel Almond, MD [facility]   08/11/2022  2:00 PM Richard Albino, MD [facility]       Medication adherence and barriers to the treatment plan have been addressed. Opportunities to optimize healthy behaviors have been discussed. Patient / caregiver voiced understanding.      HISTORY OF PRESENT ILLNESS:  Richard [name] is a 55 y.o. year old male with HTN, DM who presents to the [facility] Medicine [facility]anced Care clinic for DM and HTN.     04/28/22 PCP  Resumed spironolactone, held KCl supplement  DM: dec aspart, inc detemir, cont ozempic with plan to increase to 1mg  wkly at followup    Today, reports doing well.  Denies any issues with start of spironolactone at last visit.     Diet:  Salt intake - limited - doesn't like it. Eats mostly at home  Caffeine intake - diet soda - 1 to 2 daily; water, juice (OJ)    Exercise:   Walking daily - but limited due to stomach pain sometimes; 15 minutes    Social:  Tobacco - none  Alcohol - none    Home BP Monitoring:  Doesn't check, machine doesn't work    The patient???s Average Home Blood Pressure during the last two weeks is :   /   based on  readings  N/a    ALLERGIES/ADVERSE EVENTS: Reviewed and updated in Epic    MEDICATIONS: Reviewed  and updated in Epic  Medication Discrepancies: none  Medication Adherence: no issues, has [place] pap    ROS      VITALS:  Vitals:    05/22/22 0852   BP: 155/92   Pulse: 79   Resp: 18   Temp: 35.8 ??C (96.5 ??F)   SpO2: 98%       Physical Exam    Artist Beach, PharmD,CDE, CPP, BCACP  Clinical Pharmacist Practitioner

## 2022-05-23 LAB — TACROLIMUS LEVEL, TROUGH: TACROLIMUS, TROUGH: 1.9 ng/mL — ABNORMAL LOW (ref 5.0–15.0)

## 2022-05-26 NOTE — Unmapped (Signed)
[facility] Refill Coordination Note    Specialty Medication(s) to be Shipped:   Transplant: mycophenolate mofetil 250mg  and Prograf 0.5mg     Other medication(s) to be shipped: No additional medications requested for fill at this time     Richard [name], DOB: [birth year]  Phone: [phone] (home)       All above HIPAA information was verified with patient.     Was a Nurse, learning disability used for this call? No    Completed refill call assessment today to schedule patient's medication shipment from the [facility] [phone]).  All relevant notes have been reviewed.     Specialty medication(s) and dose(s) confirmed: Regimen is correct and unchanged.   Changes to medications: Richard [name] reports no changes at this time.  Changes to insurance: No  New side effects reported not previously addressed with a pharmacist or physician: None reported  Questions for the pharmacist: No    Confirmed patient received a Conservation officer, historic buildings and a Surveyor, mining with first shipment. The patient will receive a drug information handout for each medication shipped and additional FDA Medication Guides as required.       DISEASE/MEDICATION-SPECIFIC INFORMATION        N/A    SPECIALTY MEDICATION ADHERENCE     Medication Adherence    Patient reported X missed doses in the last month: 0  Specialty Medication: PROGRAF 0.5 MG capsule (tacrolimus)  Patient is on additional specialty medications: Yes  Additional Specialty Medications: mycophenolate 250 mg capsule (CELLCEPT)  Patient Reported Additional Medication X Missed Doses in the Last Month: 0  Patient is on more than two specialty medications: No      Adherence tools used: patient uses a pill box to manage medications                          Were doses missed due to medication being on hold? No    prograf 0.5 mg: 10 days of medicine on hand   mycophenolate 250 mg: 10 days of medicine on hand       REFERRAL TO PHARMACIST     Referral to the pharmacist: Not needed      [facility]     Shipping address confirmed in Epic.     Delivery Scheduled: Yes, Expected medication delivery date: 05/31/22.     Medication will be delivered via UPS to the prescription address in Epic WAM.    Richard [name]   [facility] Specialty Technician

## 2022-05-30 ENCOUNTER — Ambulatory Visit
Admit: 2022-05-30 | Discharge: 2022-05-31 | Payer: PRIVATE HEALTH INSURANCE | Attending: Pharmacist | Primary: Pharmacist

## 2022-05-30 DIAGNOSIS — Z6841 Body Mass Index (BMI) 40.0 and over, adult: Principal | ICD-10-CM

## 2022-05-30 DIAGNOSIS — R809 Proteinuria, unspecified: Principal | ICD-10-CM

## 2022-05-30 DIAGNOSIS — I2510 Atherosclerotic heart disease of native coronary artery without angina pectoris: Principal | ICD-10-CM

## 2022-05-30 DIAGNOSIS — I21A1 Myocardial infarction type 2: Principal | ICD-10-CM

## 2022-05-30 DIAGNOSIS — E1129 Type 2 diabetes mellitus with other diabetic kidney complication: Principal | ICD-10-CM

## 2022-05-30 DIAGNOSIS — N184 Chronic kidney disease, stage 4 (severe): Principal | ICD-10-CM

## 2022-05-30 DIAGNOSIS — Z794 Long term (current) use of insulin: Principal | ICD-10-CM

## 2022-05-30 DIAGNOSIS — I10 Essential (primary) hypertension: Principal | ICD-10-CM

## 2022-05-30 DIAGNOSIS — E1165 Type 2 diabetes mellitus with hyperglycemia: Principal | ICD-10-CM

## 2022-05-30 LAB — BASIC METABOLIC PANEL
ANION GAP: 10 mmol/L (ref 5–14)
BLOOD UREA NITROGEN: 41 mg/dL — ABNORMAL HIGH (ref 9–23)
BUN / CREAT RATIO: 8
CALCIUM: 9.2 mg/dL (ref 8.7–10.4)
CHLORIDE: 98 mmol/L (ref 98–107)
CO2: 28.6 mmol/L (ref 20.0–31.0)
CREATININE: 4.85 mg/dL — ABNORMAL HIGH
EGFR CKD-EPI (2021) MALE: 13 mL/min/{1.73_m2} — ABNORMAL LOW (ref >=60)
GLUCOSE RANDOM: 261 mg/dL — ABNORMAL HIGH (ref 70–99)
POTASSIUM: 3.2 mmol/L — ABNORMAL LOW (ref 3.4–4.8)
SODIUM: 137 mmol/L (ref 135–145)

## 2022-05-30 MED ORDER — CARVEDILOL 3.125 MG TABLET
ORAL_TABLET | Freq: Two times a day (BID) | ORAL | 11 refills | 30 days | Status: CP
Start: 2022-05-30 — End: 2023-05-30

## 2022-05-30 MED ORDER — POTASSIUM CHLORIDE ER 10 MEQ TABLET,EXTENDED RELEASE(PART/CRYST)
ORAL_TABLET | Freq: Every day | ORAL | 0 refills | 30.00000 days | Status: CP
Start: 2022-05-30 — End: 2023-05-30

## 2022-05-30 MED FILL — PROGRAF 0.5 MG CAPSULE: ORAL | 30 days supply | Qty: 120 | Fill #1

## 2022-05-30 MED FILL — MYCOPHENOLATE MOFETIL 250 MG CAPSULE: ORAL | 30 days supply | Qty: 60 | Fill #1

## 2022-05-30 NOTE — Unmapped (Addendum)
Decrease the amlodipine back to 5mg  every day    Increase Levemir to 20 units every night to target a fasting blood sugar of 80 to 130    Increase the Novolog (mealtime insulin) to 55 units with meals to target a 2 hour after meal reading of less than 180    Check labs today and will call with plan for blood pressure meds

## 2022-05-30 NOTE — Unmapped (Signed)
Internal Medicine Enhanced Care    HYPERTENSION    ASSESSMENT AND PLAN:  1. Primary hypertension    2. Type 2 MI (myocardial infarction) (CMS-HCC)    3. Coronary artery disease, unspecified vessel or lesion type, unspecified whether angina present, unspecified whether native or transplanted heart    4. Proteinuria due to type 2 diabetes mellitus (CMS-HCC)    5. CKD (chronic kidney disease) stage 4, GFR 15-29 ml/min (CMS-HCC)    6. Type 2 diabetes mellitus with hyperglycemia, with long-term current use of insulin (CMS-HCC)    7. Morbid obesity with BMI of 40.0-44.9, adult (CMS-HCC)        Hypertension not well-controlled  ??? BP not at goal. Goal <130/80  ??? Currently taking spironolactone 25mg  every morning, amlodipine 10 mg daily  ??? Not checking at home  ??? Plan: check HTN3 panel with recent resumption of spiro - K improved but remains low at 3.2. Given low K and continued rise in SCr, will stop spironolactone 25mg  daily.  Add KCl daily for hypokalemia, decrease amlodipine back to 5mg  daily secondary to edema. Stop furosemide since this was being used to treat edema caused by increased amlodipine dose.  Add carvedilol 3.125mg  bid with pulse of 89, hx of orthostatics on carvedilol 12.5mg  to 25mg  bid in the past, having to decrease amlodipine and continued elevated bp.  Followup in 1 week with repeat labs and bp check.  Plan was discussed and agreed upon by Dr Dietrich Pates and relayed to patient via telephone.  Patient verbalized understanding, was picking up meds today and results and plan were mychart messaged to patient as well.   ??? Exercise: discussed goal of 30 minutes over the course of the day, most days of the week    CV risk reduction:  The ASCVD Risk score (Arnett DK, et al., 2019) failed to calculate.  Not currently taking atorva. Wanted to try lifestyle at 11/3 appt  ??? Plan: revisit at future visit    DM:  Ozempic 0.5mg  weekly x couple months; 1 pen remaining; detemir 15 (not 40 as prescribed last visit) units q night around 10pm; aspart 45-50 units per meal   No s/s lows, remains elevated  Plan: increase detemir to 20 units nightly based on elevated fastings; increase aspart to 55 units with meals based on elevated pp, increase ozempic 1mg  weekly starting next Saturday 12/9.     Follow-up: no followup with me given PCP 12/15. PCP can re-refer back to me if needed    Future Appointments   Date Time Provider Department Center   06/06/2022  8:45 AM Marlyne Beards, CPP [facility]   06/09/2022  9:00 AM Mel Almond, MD [facility]   08/11/2022  2:00 PM [name], MD [facility]       Medication adherence and barriers to the treatment plan have been addressed. Opportunities to optimize healthy behaviors have been discussed. Patient / caregiver voiced understanding.      HISTORY OF PRESENT ILLNESS:  Richard [name] is a 56 y.o. year old male with HTN, DM who presents to the [facility] Medicine [facility] clinic for DM and HTN.     05/22/22 with me  Continue spiro despite bump in Cr. Increase amlodipine from 5mg  daily to 10mg  daily; repeat labs 1 wk    04/28/22 PCP  Resumed spironolactone, held KCl supplement  DM: dec aspart, inc detemir, cont ozempic with plan to increase to 1mg  wkly at followup  Today, reports no lightheadedness/dizziness but did restart lasix on Friday bc 'bad swelling' in legs (L>R) from knee down. Reports it improved with lasix, not back to baseline. Also noticed discomfort in balls of feet with walking. States he had not used lasix for month or so. He also reports a 10 lb weigh gain since last week. Denies sob, no more than usual, PND or DOE.    Diet: unchanged  Salt intake - limited - doesn't like it. Eats mostly at home  Caffeine intake - diet soda - 1 to 2 daily; water, juice (OJ)    Exercise: less walking this past week with leg swelling  Walking daily - but limited due to stomach pain sometimes; 15 minutes    Social: unchanged  Tobacco - none  Alcohol - none    Home BP Monitoring:  Doesn't check, machine doesn't work    The patient???s Average Home Blood Pressure during the last two weeks is :   /   based on  readings  N/a      DM:  Reports only taking detemir 15 units nightly.   Reports low of 150 during the night. Hi during night 200   Aspart 45-50 units 10-15 min prior to eating. Always higher than 180 2 hrs after eating. Usu 250  Reports if blood sugar high around 300s before eating he would inject 50 units not 45 units    Diet: 1 apple/d   Regularly eats carbs  Bs better when eats protein and half plate of veggies    ALLERGIES/ADVERSE EVENTS: Reviewed and updated in Epic    MEDICATIONS: Reviewed and updated in Epic  Medication Discrepancies: none  Medication Adherence: no issues, has [place] pap    ROS      VITALS:  Vitals:    05/30/22 0823   BP: 150/80   Pulse: 89   Resp: 18   Temp: 36 ??C (96.8 ??F)   SpO2: 98%       Physical Exam    Artist Beach, PharmD,CDE, CPP, BCACP  Clinical Pharmacist Practitioner

## 2022-05-30 NOTE — Unmapped (Signed)
[facility] at [facility]     Type of visit: face to face    Are you located in [place]? (for virtual visits only) N/A    Reason for visit: HTN     Questions / Concerns that need to be addressed:     Screening BP 150/80 P 89 manual    Omron BPs (complete if screening BP has a systolic  > 130 or diastolic > 80)  BP#1    BP#2   BP#3     Average BP   (please note this as a comment in vitals)     PTHomeBP     Diabetes:  Regularly checking blood sugars?: no  If yes, when? Complete log for past 7 days  Date Before Breakfast After Breakfast Before Lunch After Lunch Before Dinner After Dinner Before Bed    05/30/22  160(0600)  250 (1610)                                                                                                                             HCDM reviewed and updated in Epic:    We are working to make sure all of our patients??? wishes are updated in Epic and part of that is documenting a Environmental health practitioner for each patient  A Health Care Decision Maker is someone you choose who can make health care decisions for you if you are not able - who would you most want to do this for you????  was updated.    HCDM (patient stated preference): Richard [name] Sister - [phone]    BPAs completed:      COVID-19 Vaccine Summary  Which COVID-19 Vaccine was administered  Type:  Dates Given:          Date last mAB infusion:  07/18/2019          Immunization History   Administered Date(s) Administered    Hepatitis A Vaccine - Unspecified Formulation 10/06/2011, 03/30/2014    Hepatitis B vaccine, pediatric/adolescent dosage, 10/06/2011, 11/06/2011, 03/30/2014    INFLUENZA QUAD HIGH DOSE 58YRS+(FLUZONE) 04/08/2013    INFLUENZA TIV (TRI) 48MO+ W/ PRESERV (IM) 03/15/2011, 03/30/2014, 04/15/2015, 03/27/2016, 03/27/2017, 04/24/2018    Influenza Vaccine Quad (IIV4 PF) 6-73mo 04/15/2020    Influenza Vaccine Quad (IIV4 PF) 55mo+ injectable 04/15/2015, 03/27/2016, 03/27/2017, 04/24/2018, 04/28/2022 Influenza Virus Vaccine, unspecified formulation 07/03/2019    PNEUMOCOCCAL POLYSACCHARIDE 23-VALENT 06/22/2016, 03/27/2017    PPD Test 03/07/2012, 05/12/2012    Pneumococcal Conjugate 13-Valent 05/25/2011, 03/27/2016    SHINGRIX-ZOSTER VACCINE (HZV), RECOMBINANT,SUB-UNIT,ADJUVANTED IM 03/27/2017, 04/24/2018    TdaP 08/11/2008, 06/11/2016       __________________________________________________________________________________________    SCREENINGS COMPLETED IN FLOWSHEETS    HARK Screening       AUDIT       PHQ2       PHQ9          P4 Suicidality Screener  GAD7       COPD Assessment       Falls Risk

## 2022-05-31 MED ORDER — POTASSIUM CHLORIDE ER 10 MEQ TABLET,EXTENDED RELEASE(PART/CRYST)
ORAL_TABLET | 0 refills | 0 days
Start: 2022-05-31 — End: ?

## 2022-05-31 NOTE — Unmapped (Signed)
Signed Yesterday (05/30/2022):   potassium chloride 10 MEQ ER tablet   Sig: Take 1 tablet (10 mEq total) by mouth daily.   Disp: 30 tablet    Refills: 0   End date: 05/30/2023   Signed by: Marlyne Beards, CPP

## 2022-06-06 ENCOUNTER — Ambulatory Visit
Admit: 2022-06-06 | Discharge: 2022-06-07 | Payer: PRIVATE HEALTH INSURANCE | Attending: Pharmacist | Primary: Pharmacist

## 2022-06-06 DIAGNOSIS — I10 Essential (primary) hypertension: Principal | ICD-10-CM

## 2022-06-06 DIAGNOSIS — Z794 Long term (current) use of insulin: Principal | ICD-10-CM

## 2022-06-06 DIAGNOSIS — E1165 Type 2 diabetes mellitus with hyperglycemia: Principal | ICD-10-CM

## 2022-06-06 LAB — ALBUMIN / CREATININE URINE RATIO
ALBUMIN QUANT URINE: 177.3 mg/dL
ALBUMIN/CREATININE RATIO: 1316.3 ug/mg — ABNORMAL HIGH (ref 0.0–30.0)
CREATININE, URINE: 134.7 mg/dL

## 2022-06-06 LAB — BASIC METABOLIC PANEL
ANION GAP: 14 mmol/L (ref 5–14)
BLOOD UREA NITROGEN: 43 mg/dL — ABNORMAL HIGH (ref 9–23)
BUN / CREAT RATIO: 9
CALCIUM: 8.8 mg/dL (ref 8.7–10.4)
CHLORIDE: 102 mmol/L (ref 98–107)
CO2: 25.7 mmol/L (ref 20.0–31.0)
CREATININE: 4.66 mg/dL — ABNORMAL HIGH
EGFR CKD-EPI (2021) MALE: 14 mL/min/{1.73_m2} — ABNORMAL LOW (ref >=60)
GLUCOSE RANDOM: 177 mg/dL (ref 70–179)
POTASSIUM: 3.3 mmol/L — ABNORMAL LOW (ref 3.4–4.8)
SODIUM: 142 mmol/L (ref 135–145)

## 2022-06-06 LAB — PROTEIN / CREATININE RATIO, URINE
CREATININE, URINE: 135.2 mg/dL
PROTEIN URINE: 397.4 mg/dL
PROTEIN/CREAT RATIO, URINE: 2.939

## 2022-06-06 LAB — ALBUMIN: ALBUMIN: 3.1 g/dL — ABNORMAL LOW (ref 3.4–5.0)

## 2022-06-06 NOTE — Unmapped (Signed)
Internal Medicine [facility]    HYPERTENSION    ASSESSMENT AND PLAN:  1. Type 2 diabetes mellitus with hyperglycemia, with long-term current use of insulin (CMS-HCC)    2. Primary hypertension    3. Type 2 MI (myocardial infarction) (CMS-HCC)    4. Coronary artery disease, unspecified vessel or lesion type, unspecified whether angina present, unspecified whether native or transplanted heart    5. Chronic heart failure with preserved ejection fraction (CMS-HCC)    6. CKD (chronic kidney disease) stage 4, GFR 15-29 ml/min (CMS-HCC)        Hypertension not well-controlled  ??? BP not at goal. Goal <130/80  ??? Currently taking amlodipine 5 mg daily  ??? Not checking at home  ??? Weight increased 10 lbs since last week, no increased SOB, DOE, PND. Discussed with Dr Dietrich Pates.  Order urine scan, microalb/creat, albumin, albumin/cr ratio and BMP  ??? Plan: bladder scan today normal.  Albumin low along with significant proteinuria.  HTN3 panel - K stable on KCl daily but remains low. Given low K, will continue KCl daily for hypokalemia, continue amlodipine 5mg  daily secondary to edema with 10mg . Continue to hold furosemide. Increase carvedilol 3.125mg  to 2 tabs bid. Followup with PCP 12/15. Instructed patient to call and schedule followup with nephrology.  Plan was discussed and agreed upon by Dr Dietrich Pates and relayed to patient via telephone.  Patient verbalized understanding.   ??? Exercise: goal of 30 minutes over the course of the day, most days of the week      CV risk reduction:  The ASCVD Risk score (Arnett DK, et al., 2019) failed to calculate.  Not currently taking atorva. Wanted to try lifestyle at 11/3 appt  ??? Plan: revisit at future visit    DM: not addressed in depth today for titration due to HTN issues above  Ozempic 0.5mg  weekly x couple months; 1 pen remaining; detemir 15 (not 40 as prescribed last visit) units q night around 10pm; aspart 45-50 units per meal   No s/s lows, remains elevated  Plan: continue detemir to 20 units nightly, aspart to 55 units with meals based on elevated pp, ozempic 1mg  weekly (started Saturday 12/9).     Follow-up:  PCP 12/15. PCP can re-refer back to me if needed    Future Appointments   Date Time Provider Department Center   06/09/2022  9:00 AM Mel Almond, MD [facility]   08/11/2022  2:00 PM [name], MD [facility]       Medication adherence and barriers to the treatment plan have been addressed. Opportunities to optimize healthy behaviors have been discussed. Patient / caregiver voiced understanding.      HISTORY OF PRESENT ILLNESS:  Richard [name] is a 56 y.o. year old male with HTN, DM who presents to the Internal Medicine [facility]anced [facility] clinic for DM and HTN.     05/30/22 with me  Decrease amlodipine from 10mg  daily to 5mg  daily due to edema  Stop furosemide due to bump in SCr and decreasing amlodipine so shouldn't be needed  Stop spiro  Add carvedilol 3.125mg  bid    05/22/22 with me  Continue spiro despite bump in Cr. Increase amlodipine from 5mg  daily to 10mg  daily; repeat labs 1 wk    04/28/22 PCP  Resumed spironolactone, held KCl supplement  DM: dec aspart, inc detemir, cont ozempic with plan to increase to 1mg  wkly at followup    Today, reports no lightheadedness/dizziness but has had  a headached over past couple nights.  Reports weight gain since last week. Not taking furosemide, did decrease amlodipine with minimal improvement in swelling.  Reports L hand and legs swelling.  He also reports a 10 lb weigh gain since last week. Denies sob, no more than usual, PND or DOE. Friends do tell him it is more loud breathing. Some slight sluggishness with addition of carvedilol    Diet: unchanged  Salt intake - limited - doesn't like it. Eats mostly at home  Caffeine intake - diet soda - 1 to 2 daily; water, juice (OJ)    Exercise: less walking with leg swelling  Walking daily - but limited due to stomach pain sometimes; 15 minutes    Social: unchanged  Tobacco - none  Alcohol - none    Home BP Monitoring:  Doesn't check, machine doesn't work    The patient???s Average Home Blood Pressure during the last two weeks is :   /   based on  readings  N/a      DM: not addressed today  Last visit 12/5  Reports only taking detemir 15 units nightly.   Reports low of 150 during the night. Hi during night 200   Aspart 45-50 units 10-15 min prior to eating. Always higher than 180 2 hrs after eating. Usu 250  Reports if blood sugar high around 300s before eating he would inject 50 units not 45 units    Diet: 1 apple/d   Regularly eats carbs  Bs better when eats protein and half plate of veggies    ALLERGIES/ADVERSE EVENTS: Reviewed and updated in Epic    MEDICATIONS: Reviewed and updated in Epic  Medication Discrepancies: none  Medication Adherence: no issues, has Faison pap    ROS      VITALS:  Vitals:    06/06/22 0837   BP: 156/93   Pulse: 74   Resp: 18   Temp: 36 ??C (96.8 ??F)   SpO2: 98%       Physical Exam    [place], PharmD,CDE, CPP, BCACP  Clinical Pharmacist Practitioner

## 2022-06-06 NOTE — Unmapped (Signed)
Increase carvedilol 3.125mg  to 2 tablets in the morning at 9am and 2 tablets in the evening at 9pm.    Will check blood work today and call back with plan for other meds.

## 2022-06-06 NOTE — Unmapped (Signed)
[facility] at [facility]   Bladder Scan performed total volume 33  Out put  before bladder scan100  Type of visit: face to face    Are you located in [place]? (for virtual visits only) N/A    Reason for visit: HTN     Questions / Concerns that need to be addressed:     Screening BP    Omron BPs (complete if screening BP has a systolic  > 130 or diastolic > 80)  BP#1 156/92 P 74   BP#2 157/91 P 75  BP#3 155/96 P 74    Average BP 156/93 P 74  (please note this as a comment in vitals)     PTHomeBP     Diabetes:  Regularly checking blood sugars?: no  If yes, when? Complete log for past 7 days  Date Before Breakfast After Breakfast Before Lunch After Lunch Before Dinner After Dinner Before Bed    06/06/22  180                                                                                                                             HCDM reviewed and updated in Epic:    We are working to make sure all of our patients??? wishes are updated in Epic and part of that is documenting a Environmental health practitioner for each patient  A Health Care Decision Maker is someone you choose who can make health care decisions for you if you are not able - who would you most want to do this for you????  was updated.    HCDM (patient stated preference): Richard [name] Sister - [phone]    BPAs completed:      COVID-19 Vaccine Summary  Which COVID-19 Vaccine was administered  Type:  Dates Given:          Date last mAB infusion:  07/18/2019          Immunization History   Administered Date(s) Administered    Hepatitis A Vaccine - Unspecified Formulation 10/06/2011, 03/30/2014    Hepatitis B vaccine, pediatric/adolescent dosage, 10/06/2011, 11/06/2011, 03/30/2014    INFLUENZA QUAD HIGH DOSE 67YRS+(FLUZONE) 04/08/2013    INFLUENZA TIV (TRI) 98MO+ W/ PRESERV (IM) 03/15/2011, 03/30/2014, 04/15/2015, 03/27/2016, 03/27/2017, 04/24/2018    Influenza Vaccine Quad (IIV4 PF) 6-28mo 04/15/2020    Influenza Vaccine Quad (IIV4 PF) 40mo+ injectable 04/15/2015, 03/27/2016, 03/27/2017, 04/24/2018, 04/28/2022    Influenza Virus Vaccine, unspecified formulation 07/03/2019    PNEUMOCOCCAL POLYSACCHARIDE 23-VALENT 06/22/2016, 03/27/2017    PPD Test 03/07/2012, 05/12/2012    Pneumococcal Conjugate 13-Valent 05/25/2011, 03/27/2016    SHINGRIX-ZOSTER VACCINE (HZV), RECOMBINANT,SUB-UNIT,ADJUVANTED IM 03/27/2017, 04/24/2018    TdaP 08/11/2008, 06/11/2016       __________________________________________________________________________________________    SCREENINGS COMPLETED IN FLOWSHEETS    HARK Screening       AUDIT       PHQ2       PHQ9  P4 Suicidality Screener                GAD7       COPD Assessment       Falls Risk

## 2022-06-08 MED ORDER — ASPIRIN 81 MG TABLET,DELAYED RELEASE
ORAL_TABLET | Freq: Every day | ORAL | 11 refills | 30 days
Start: 2022-06-08 — End: 2023-06-08

## 2022-06-08 MED ORDER — ATORVASTATIN 40 MG TABLET
ORAL_TABLET | Freq: Every day | ORAL | 3 refills | 90 days
Start: 2022-06-08 — End: 2023-06-08

## 2022-06-08 NOTE — Unmapped (Signed)
Internal Medicine Clinic Visit    Reason for visit: Follow-up of diabetes    A/P:         1. Primary hypertension    2. Chronic heart failure with preserved ejection fraction (CMS-HCC)    3. Coronary artery disease involving native coronary artery of native heart, unspecified whether angina present    4. Obstructive sleep apnea syndrome    5. Type 2 diabetes mellitus with hyperglycemia, with long-term current use of insulin (CMS-HCC)          Primary hypertension  Overview:  Amlodipine 5, resumed furosemide 40. Previously self discontinued Cleda Daub 25 and valsart 80 ?b/c of low BP    Orders:  -     carvediloL; Take 1 tablet (6.25 mg total) by mouth two (2) times a day.    Chronic heart failure with preserved ejection fraction (CMS-HCC)  Overview:  Hospitalization requiring IV diuresis, no O2 requirement, discharged 10/26/2021. Has LE swelling w/o taking Lasix as of 04/2022    08/2021 TTE  Summary    1. Technically difficult study due to chest wall/lung interference.    2. An ultrasound enhancing agent was used to improve the visualization of  the left ventricular cavity and endocardial borders.    3. Concentric left ventricular hypertrophy with overall normal systolic  function (EF 55-60%), with grade 2 diastolic dysfunction and segmental  hypokinesis of the mid-inferior wall.    4. The right ventricle is mildly to moderately dilated in size, with normal  systolic function.    5. There are no significant structural valvular abnormalities.    6. Mild to moderate biatrial enlargement.    Assessment & Plan:  - Reached out to pt on mychart to assure he is responding well to Lasix by Mon/Tues   - sched f/u in 2 weeks w/ another resident to assess HF and titrate diuretic outpt     Orders:  -     carvediloL; Take 1 tablet (6.25 mg total) by mouth two (2) times a day.  -     furosemide; Take 1 tablet (40 mg total) by mouth daily. Take Furosemide aka Lasix 40 mg twice a day for 3 days, starting at 7 am and then the next dose at distant heart sounds, no murmurs  Pulm: CTA bilaterally, no crackles or wheezes but poor air mvmt  Abd: epigastric tenderness, obese, normal BS.   Ext: +1 LE edema      PHQ-9 Score:     GAD-7 Score:       Medication adherence and barriers to the treatment plan have been addressed. Opportunities to optimize healthy behaviors have been discussed. Patient / caregiver voiced understanding.  Internal Medicine Clinic Visit    Reason for visit: ***    A/P:    {TIP - HCC- RAFF Pilot- Clinical Documentation Specialist Recommendations-  No specialty comments available.   This text will self delete upon signing note:[id number]}     No diagnosis found.    ***    2. ***    3. ***    No follow-ups on file.    Staffed with Dr. ***, {seen/discussed:75519}    __________________________________________________________    HPI:    ***  __________________________________________________________        Medications:  Reviewed in EPIC  __________________________________________________________    Physical Exam:   Vital Signs:  There were no vitals filed for this visit.       PTHomeBP    The patient???s Average Home Blood Pressure during  the last two weeks is :   /   based on  readings            Gen: Well appearing, NAD  CV: RRR, no murmurs  Pulm: CTA bilaterally, no crackles or wheezes  Abd: Soft, NTND, normal BS.   Ext: No edema  ***    PHQ-9 Score:     GAD-7 Score:       Medication adherence and barriers to the treatment plan have been addressed. Opportunities to optimize healthy behaviors have been discussed. Patient / caregiver voiced understanding. Take 1 tablet (40 mg total) by mouth nightly. Med to prevent heart attacks and help cholesterol              Return in about 2 weeks (around 06/23/2022) for Recheck.    2 weeks w/ Resident for HTN/DM2, check in for HF/weight gain; and 4-6 weeks with me for same follow up issues     Staffed with Dr. Lennie Muckle, seen and discussed    __________________________________________________________    HPI:    Having ongoing weight gain, SOB and cough all over the same time period, since 08/2021. SOB isn't worse when lying flat, it's only with exertion. Even with tying shoes now. Also has a 20 lb weight gain in last couple weeks, and has overall trended up in weight since 08/2021. Had a clear CXR in Oct. No real significant smoking hx (0.5 ppd 1-2 yr). Doesn't get chest pain ever, never has. Nor has chest tightness. Unsure but thinks he may be urinating a little less. Definitely eating less b/c has abdo pain from Ozempic and he feels this is c/w that.     Had stopped taking Furosemide awhile ago as he thought his blood pressure was managed well enough without this. Taking Coreg and up titrated successfully.     BG's have been lower, only max around 250 but mostly checks before he eats. Taking 55 units TIDAC and 20 units qhs, but told to go up 25 units and we reviewed that he needs to watch his fasting AM glucose and if this is below 120 his qhs insulin is prob too strong.   __________________________________________________________        Medications:  Reviewed in EPIC  __________________________________________________________    Physical Exam:   Vital Signs:  Vitals:    06/09/22 0837   BP: 140/92   BP Site: L Arm   BP Position: Sitting   BP Cuff Size: X-Large   Pulse: 69   Resp: 19   Temp: 36.8 ??C (98.3 ??F)   TempSrc: Temporal   SpO2: 97%   Weight: (!) 136.9 kg (301 lb 12.8 oz)   Height: 177.8 cm (5' 10)            PTHomeBP    The patient???s Average Home Blood Pressure during the last two weeks is :   /   based on readings      Gen: NAD  CV: distant heart sounds, no murmurs, JVD 1-2 cm below jawline  Pulm: CTA bilaterally, no crackles or wheezes   Abd: nontender, + for obesity, normal BS.   Ext: +2 LE edema      PHQ-9 Score:     GAD-7 Score:       Medication adherence and barriers to the treatment plan have been addressed. Opportunities to optimize healthy behaviors have been discussed. Patient / caregiver voiced understanding.

## 2022-06-09 ENCOUNTER — Ambulatory Visit: Admit: 2022-06-09 | Discharge: 2022-06-10 | Payer: PRIVATE HEALTH INSURANCE

## 2022-06-09 DIAGNOSIS — G4733 Obstructive sleep apnea (adult) (pediatric): Principal | ICD-10-CM

## 2022-06-09 DIAGNOSIS — E1165 Type 2 diabetes mellitus with hyperglycemia: Principal | ICD-10-CM

## 2022-06-09 DIAGNOSIS — I2510 Atherosclerotic heart disease of native coronary artery without angina pectoris: Principal | ICD-10-CM

## 2022-06-09 DIAGNOSIS — I10 Essential (primary) hypertension: Principal | ICD-10-CM

## 2022-06-09 DIAGNOSIS — Z794 Long term (current) use of insulin: Principal | ICD-10-CM

## 2022-06-09 DIAGNOSIS — I5032 Chronic diastolic (congestive) heart failure: Principal | ICD-10-CM

## 2022-06-09 MED ORDER — ATORVASTATIN 40 MG TABLET
ORAL_TABLET | Freq: Every evening | ORAL | 3 refills | 90 days | Status: CP
Start: 2022-06-09 — End: 2023-06-09

## 2022-06-09 MED ORDER — FUROSEMIDE 40 MG TABLET
ORAL_TABLET | Freq: Every day | ORAL | 11 refills | 30 days | Status: CP
Start: 2022-06-09 — End: 2023-06-09

## 2022-06-09 MED ORDER — CARVEDILOL 6.25 MG TABLET
ORAL_TABLET | Freq: Two times a day (BID) | ORAL | 3 refills | 90 days | Status: CP
Start: 2022-06-09 — End: 2023-06-09

## 2022-06-09 NOTE — Unmapped (Addendum)
Take Furosemide aka Lasix 40 mg twice a day for 3 days, starting at 7 am and then the next dose at 1pm   Then go back to taking Lasix 40 mg once a day   Aim to target a fasting morning glucose of 140 as a good goal, if you're morning fasting glucose is <120 your nighttime long acting insulin may be too much, message to let me know   Start taking Atorvastatin to prevent heart attacks once a day at same time you take PM

## 2022-06-09 NOTE — Unmapped (Signed)
Slightly improved control. BG's have been lower, only max around 250 but mostly checks before he eats. Taking 55 units TIDAC and 20 units qhs, but told to go up 25 units and we reviewed that he needs to watch his fasting AM glucose and if this is below 120 his qhs insulin is prob too strong.

## 2022-06-09 NOTE — Unmapped (Unsigned)
[facility] at [facility]     Type of visit: face to face    Are you located in [place]? (for virtual visits only) N/A    Reason for visit: Follow up    Questions / Concerns that need to be addressed: 2 weeks been having shortness of breath         Omron BPs (complete if screening BP has a systolic  > 130 or diastolic > 80)  BP#1 141/87 P 69  BP#2 134/102 P 67  BP#3 146/88 P 71    Average BP 140/92 P 69  (please note this as a comment in vitals)     PTHomeBP     Diabetes:  Regularly checking blood sugars?: no  If yes, when? Complete log for past 7 days  Date Before Breakfast After Breakfast Before Lunch After Lunch Before Dinner After Dinner Before Bed                                                                                                                                   HCDM reviewed and updated in Epic:    We are working to make sure all of our patients??? wishes are updated in Epic and part of that is documenting a Environmental health practitioner for each patient  A Health Care Decision Maker is someone you choose who can make health care decisions for you if you are not able - who would you most want to do this for you????  is already up to date.    HCDM (patient stated preference): [name] Sister - [phone]    BPAs completed:  N/A     COVID-19 Vaccine Summary  Which COVID-19 Vaccine was administered  Type:  Dates Given:          Date last mAB infusion:  07/18/2019          If no: Are you interested in scheduling? Declines second dose    Immunization History   Administered Date(s) Administered    Hepatitis A Vaccine - Unspecified Formulation 10/06/2011, 03/30/2014    Hepatitis B vaccine, pediatric/adolescent dosage, 10/06/2011, 11/06/2011, 03/30/2014    INFLUENZA QUAD HIGH DOSE 75YRS+(FLUZONE) 04/08/2013    INFLUENZA TIV (TRI) 53MO+ W/ PRESERV (IM) 03/15/2011, 03/30/2014, 04/15/2015, 03/27/2016, 03/27/2017, 04/24/2018    Influenza Vaccine Quad (IIV4 PF) 6-17mo 04/15/2020 Influenza Vaccine Quad(IM)6 MO-Adult(PF) 04/15/2015, 03/27/2016, 03/27/2017, 04/24/2018, 04/28/2022    Influenza Virus Vaccine, unspecified formulation 07/03/2019    PNEUMOCOCCAL POLYSACCHARIDE 23-VALENT 06/22/2016, 03/27/2017    PPD Test 03/07/2012, 05/12/2012    Pneumococcal Conjugate 13-Valent 05/25/2011, 03/27/2016    SHINGRIX-ZOSTER VACCINE (HZV), RECOMBINANT,SUB-UNIT,ADJUVANTED IM 03/27/2017, 04/24/2018    TdaP 08/11/2008, 06/11/2016       __________________________________________________________________________________________    SCREENINGS COMPLETED IN FLOWSHEETS    HARK Screening       AUDIT       PHQ2       PHQ9  P4 Suicidality Screener                GAD7       COPD Assessment       Falls Risk

## 2022-06-10 NOTE — Unmapped (Addendum)
-   Reached out to pt on mychart to assure he is responding well to Lasix by Mon/Tues   - sched f/u in 2 weeks w/ another resident to assess HF and titrate diuretic outpt

## 2022-06-10 NOTE — Unmapped (Signed)
Decided for now OK not to do aspirin

## 2022-06-10 NOTE — Unmapped (Signed)
-   counseled that untreated OSA greatly affects CV health/BP negatively

## 2022-06-12 NOTE — Unmapped (Signed)
I saw and evaluated the patient, participating in the key portions of the service.  I reviewed the resident’s note.  I agree with the resident’s findings and plan. Keirstan Iannello R Olena Willy, MD

## 2022-06-26 MED FILL — OZEMPIC 1 MG/DOSE (4 MG/3 ML) SUBCUTANEOUS PEN INJECTOR: SUBCUTANEOUS | 28 days supply | Qty: 3 | Fill #1

## 2022-06-28 MED ORDER — POTASSIUM CHLORIDE ER 10 MEQ TABLET,EXTENDED RELEASE(PART/CRYST)
ORAL_TABLET | 0 refills | 0 days | Status: CP
Start: 2022-06-28 — End: ?
  Filled 2022-09-21: qty 9, 90d supply, fill #0

## 2022-06-29 NOTE — Unmapped (Signed)
Internal Medicine Clinic Visit    Reason for visit: heart failure follow up    A/P:    {TIP - HCC- RAFF Pilot- Clinical Documentation Specialist Recommendations-  No specialty comments available.   This text will self delete upon signing note:[id number]}     No diagnosis found.    ***    2. ***    3. ***    No follow-ups on file.    Staffed with Dr. ***, {seen/discussed:75519}    __________________________________________________________    HPI:    ***      Chronic heart failure with preserved ejection fraction (CMS-HCC)  08/3021 TTE w LVEF 55-60%, G2DD, hypokinesis of mid-inferior wall, no valve abn. Hospitalization requiring IV diuresis, no O2 requirement, discharged 10/26/2021. Has LE swelling w/o taking Lasix as of 04/2022. During 06/09/22 visit, patient was instructed to take Lasix 40mg  BID x3 days then resume daily. Also takes coreg 6.25mg  BID. Follow up today 1/5 is to assess V status.  weight ***  peeing? ***  swelling ***  SOB ***  -spironolactone? for diuresis and hypokalemia?  __________________________________________________________        Medications:  Reviewed in EPIC  __________________________________________________________    Physical Exam:   Vital Signs:  There were no vitals filed for this visit.       PTHomeBP    The patient???s Average Home Blood Pressure during the last two weeks is :   /   based on  readings            Gen: Well appearing, NAD  CV: RRR, no murmurs  Pulm: CTA bilaterally, no crackles or wheezes  Abd: Soft, NTND, normal BS.   Ext: No edema  ***    PHQ-9 Score:     GAD-7 Score:       Medication adherence and barriers to the treatment plan have been addressed. Opportunities to optimize healthy behaviors have been discussed. Patient / caregiver voiced understanding.    Tami Lin, MD  Internal Medicine PGY1 then resume daily. Also takes coreg 6.25mg  BID. Follow up today 1/5 is to assess V status. Patient did not notice much decrease in swelling with taking Lasix BID vs daily. Swelling is worse at the end of the day. No orthopnea. Swelling worst in legs bilaterally. Has some SOB too with activities but not at rest. Having good urine output with Lasix. Since last appt, has lost about 10lbs.    HTN  Patient does not take BP log at home. Medication regimen includes amlodipine 5mg , Lasix 40 daily, coreg 6.25 BID. BP in office today elevated to 167/95    CKD5- last seen 03/2022. Per chart review, recommended that patient have follow up, none scheduled thus far. Will provide phone number to call.  __________________________________________________________        Medications:  Reviewed in EPIC  __________________________________________________________    Physical Exam:   Vital Signs:  Vitals:    06/30/22 0831   BP: 167/95   BP Site: L Arm   BP Position: Sitting   BP Cuff Size: X-Large   Pulse: 70   Temp: 36.3 ??C (97.3 ??F)   TempSrc: Temporal   SpO2: 98%   Weight: (!) 132.2 kg (291 lb 6.4 oz)   Height: 177.8 cm (5' 10)          PTHomeBP    The patient???s Average Home Blood Pressure during the last two weeks is :   /   based on  readings  Gen: Well appearing, NAD  CV: RRR, no murmurs  Pulm: CTA bilaterally, no crackles or wheezes  Abd: Soft, NTND.  Ext: Edema bilaterally to the mid shins      PHQ-9 Score:     GAD-7 Score:       Medication adherence and barriers to the treatment plan have been addressed. Opportunities to optimize healthy behaviors have been discussed. Patient / caregiver voiced understanding.    Tami Lin, MD  Internal Medicine PGY1

## 2022-06-30 ENCOUNTER — Ambulatory Visit: Admit: 2022-06-30 | Discharge: 2022-06-30 | Payer: PRIVATE HEALTH INSURANCE

## 2022-06-30 DIAGNOSIS — I5032 Chronic diastolic (congestive) heart failure: Principal | ICD-10-CM

## 2022-06-30 DIAGNOSIS — Z944 Liver transplant status: Principal | ICD-10-CM

## 2022-06-30 DIAGNOSIS — E612 Magnesium deficiency: Principal | ICD-10-CM

## 2022-06-30 DIAGNOSIS — Z5181 Encounter for therapeutic drug level monitoring: Principal | ICD-10-CM

## 2022-06-30 DIAGNOSIS — N184 Chronic kidney disease, stage 4 (severe): Principal | ICD-10-CM

## 2022-06-30 DIAGNOSIS — I10 Essential (primary) hypertension: Principal | ICD-10-CM

## 2022-06-30 LAB — COMPREHENSIVE METABOLIC PANEL
ALBUMIN: 4 g/dL (ref 3.4–5.0)
ALKALINE PHOSPHATASE: 107 U/L (ref 46–116)
ALT (SGPT): 22 U/L (ref 10–49)
ANION GAP: 15 mmol/L — ABNORMAL HIGH (ref 5–14)
AST (SGOT): 24 U/L (ref <=34)
BILIRUBIN TOTAL: 1.1 mg/dL (ref 0.3–1.2)
BLOOD UREA NITROGEN: 36 mg/dL — ABNORMAL HIGH (ref 9–23)
BUN / CREAT RATIO: 9
CALCIUM: 9.8 mg/dL (ref 8.7–10.4)
CHLORIDE: 107 mmol/L (ref 98–107)
CO2: 20.5 mmol/L (ref 20.0–31.0)
CREATININE: 4.03 mg/dL — ABNORMAL HIGH
EGFR CKD-EPI (2021) MALE: 17 mL/min/{1.73_m2} — ABNORMAL LOW (ref >=60)
GLUCOSE RANDOM: 237 mg/dL — ABNORMAL HIGH (ref 70–99)
POTASSIUM: 3.6 mmol/L (ref 3.4–4.8)
PROTEIN TOTAL: 7.7 g/dL (ref 5.7–8.2)
SODIUM: 142 mmol/L (ref 135–145)

## 2022-06-30 LAB — CBC W/ AUTO DIFF
BASOPHILS ABSOLUTE COUNT: 0.1 10*9/L (ref 0.0–0.1)
BASOPHILS RELATIVE PERCENT: 0.7 %
EOSINOPHILS ABSOLUTE COUNT: 0.4 10*9/L (ref 0.0–0.5)
EOSINOPHILS RELATIVE PERCENT: 5.3 %
HEMATOCRIT: 38.3 % — ABNORMAL LOW (ref 39.0–48.0)
HEMOGLOBIN: 12.8 g/dL — ABNORMAL LOW (ref 12.9–16.5)
LYMPHOCYTES ABSOLUTE COUNT: 1.4 10*9/L (ref 1.1–3.6)
LYMPHOCYTES RELATIVE PERCENT: 16.2 %
MEAN CORPUSCULAR HEMOGLOBIN CONC: 33.4 g/dL (ref 32.0–36.0)
MEAN CORPUSCULAR HEMOGLOBIN: 26.9 pg (ref 25.9–32.4)
MEAN CORPUSCULAR VOLUME: 80.6 fL (ref 77.6–95.7)
MEAN PLATELET VOLUME: 7.2 fL (ref 6.8–10.7)
MONOCYTES ABSOLUTE COUNT: 0.5 10*9/L (ref 0.3–0.8)
MONOCYTES RELATIVE PERCENT: 6.1 %
NEUTROPHILS ABSOLUTE COUNT: 6 10*9/L (ref 1.8–7.8)
NEUTROPHILS RELATIVE PERCENT: 71.7 %
PLATELET COUNT: 279 10*9/L (ref 150–450)
RED BLOOD CELL COUNT: 4.75 10*12/L (ref 4.26–5.60)
RED CELL DISTRIBUTION WIDTH: 13.7 % (ref 12.2–15.2)
WBC ADJUSTED: 8.4 10*9/L (ref 3.6–11.2)

## 2022-06-30 LAB — B-TYPE NATRIURETIC PEPTIDE: B-TYPE NATRIURETIC PEPTIDE: 676.63 pg/mL — ABNORMAL HIGH (ref <=100)

## 2022-06-30 LAB — TACROLIMUS LEVEL, TROUGH: TACROLIMUS, TROUGH: 1.3 ng/mL — ABNORMAL LOW (ref 5.0–15.0)

## 2022-06-30 LAB — PHOSPHORUS: PHOSPHORUS: 4.6 mg/dL (ref 2.4–5.1)

## 2022-06-30 LAB — GAMMA GT: GAMMA GLUTAMYL TRANSFERASE: 47 U/L

## 2022-06-30 LAB — BILIRUBIN, DIRECT: BILIRUBIN DIRECT: 0.4 mg/dL — ABNORMAL HIGH (ref 0.00–0.30)

## 2022-06-30 LAB — MAGNESIUM: MAGNESIUM: 1.8 mg/dL (ref 1.6–2.6)

## 2022-06-30 MED ORDER — SPIRONOLACTONE 25 MG TABLET
ORAL_TABLET | Freq: Every day | ORAL | 3 refills | 30 days | Status: CP
Start: 2022-06-30 — End: 2022-10-28

## 2022-06-30 NOTE — Unmapped (Signed)
It was great seeing you today! At your visit we discussed the following:    1. Chronic heart failure with preserved ejection fraction  Your swelling is stable today, if not a little improved. We will check some labs today to help guide [place] whether we need to make any adjustments to your current medication regimen and call you with results.     2. CKD (chronic kidney disease)/ High blood pressure  We recommend scheduling a follow up appointment with your nephrologist to discuss blood pressure management and which medications would be safe with your kidney disease. Please call: [phone] to schedule.    You will have follow up in 1 month to check on your swelling and blood pressure.       We will contact you with any lab or imaging results.    Please contact via MyChart with simple questions or concerns such as: medication refills, referrals, lab results or what we discussed at today's visit.  Please schedule an appointment for longer questions or concerns such as: a new medical problem or changes to an exisiting problem that would require a lengthy discussion via MyChart.

## 2022-06-30 NOTE — Unmapped (Signed)
[facility] at [facility]     Type of visit: face to face    Are you located in [place]? (for virtual visits only) N/A    Reason for visit: Follow up    Questions / Concerns that need to be addressed: medication refill        Omron BPs (complete if screening BP has a systolic  > 130 or diastolic > 80)  BP#1 164 98 p 72   BP#2 167 91 p 70  BP#3 170 95 p 67  167 95   Average BP 167 95 p 70  (please note this as a comment in vitals)         HCDM reviewed and updated in Epic:    We are working to make sure all of our patients??? wishes are updated in Epic and part of that is documenting a Environmental health practitioner for each patient  A Health Care Decision Maker is someone you choose who can make health care decisions for you if you are not able - who would you most want to do this for you????  is already up to date.    HCDM (patient stated preference): [name] - [phone]    BPAs completed:  PHQ2    COVID-19 Vaccine Summary  Which COVID-19 Vaccine was administered  Type:  Dates Given:          Date last mAB infusion:  07/18/2019          If no: Are you interested in scheduling?     Immunization History   Administered Date(s) Administered    Hepatitis A Vaccine - Unspecified Formulation 10/06/2011, 03/30/2014    Hepatitis B vaccine, pediatric/adolescent dosage, 10/06/2011, 11/06/2011, 03/30/2014    INFLUENZA QUAD HIGH DOSE 68YRS+(FLUZONE) 04/08/2013    INFLUENZA TIV (TRI) 61MO+ W/ PRESERV (IM) 03/15/2011, 03/30/2014, 04/15/2015, 03/27/2016, 03/27/2017, 04/24/2018    Influenza Vaccine Quad (IIV4 PF) 6-46mo 04/15/2020    Influenza Vaccine Quad(IM)6 MO-Adult(PF) 04/15/2015, 03/27/2016, 03/27/2017, 04/24/2018, 04/28/2022    Influenza Virus Vaccine, unspecified formulation 07/03/2019    PNEUMOCOCCAL POLYSACCHARIDE 23-VALENT 06/22/2016, 03/27/2017    PPD Test 03/07/2012, 05/12/2012    Pneumococcal Conjugate 13-Valent 05/25/2011, 03/27/2016    SHINGRIX-ZOSTER VACCINE (HZV),RECOMBINANT,ADJUVANTED(IM) 03/27/2017, 04/24/2018    TdaP 08/11/2008, 06/11/2016       __________________________________________________________________________________________    SCREENINGS COMPLETED IN FLOWSHEETS    HARK Screening       AUDIT       PHQ2  PHQ-2 Total Score : 0    PHQ9          P4 Suicidality Screener                GAD7       COPD Assessment       Falls Risk

## 2022-07-03 DIAGNOSIS — N184 Chronic kidney disease, stage 4 (severe): Principal | ICD-10-CM

## 2022-07-03 DIAGNOSIS — I5032 Chronic diastolic (congestive) heart failure: Principal | ICD-10-CM

## 2022-07-04 NOTE — Unmapped (Addendum)
LM4P to make Hypertension appt.  ----- Message from [name] Lacy, MD sent at 07/04/2022  9:59 AM EST -----  Hi team    Please schedule Mr Richard [name] for an appointment with enhanced care for hypertension at the end of this week or beginning of next week.    Thanks  [employer]

## 2022-07-06 NOTE — Unmapped (Signed)
Called patient to follow-up initiation of spironolactone.  He has yet to pick it up.  I reminded him that once he starts that he should stop his potassium supplementation.  He has follow-up with enhanced care on 1/24.  We discussed starting a spironolactone 1 week prior to his appointment so that he can get repeat labs in about 1 week.  He voiced understanding.

## 2022-07-07 NOTE — Unmapped (Signed)
Contacted patient about subpar Tac with 1/5 results. He immediately said he had forgotten to increase his dose to 2 caps bid (1mg  bid) when instructed by this TNC on 10/31. He agreed to change his pillbox and repeat labs in the next two weeks.

## 2022-07-12 MED FILL — NOVOLOG FLEXPEN U-100 INSULIN ASPART 100 UNIT/ML (3 ML) SUBCUTANEOUS: SUBCUTANEOUS | 50 days supply | Qty: 60 | Fill #1

## 2022-07-17 NOTE — Unmapped (Signed)
Called and spoke to patient and scheduled Kidney Txp Eval High BMI for 09/26/2022.  Letter sent via mail and routed to mychart.

## 2022-07-19 ENCOUNTER — Ambulatory Visit: Admit: 2022-07-19 | Payer: PRIVATE HEALTH INSURANCE

## 2022-07-26 LAB — COMPREHENSIVE METABOLIC PANEL
ALBUMIN: 4 g/dL (ref 3.4–5.0)
ALKALINE PHOSPHATASE: 101 U/L (ref 46–116)
ALT (SGPT): 17 U/L (ref 10–49)
ANION GAP: 9 mmol/L (ref 5–14)
AST (SGOT): 16 U/L (ref <=34)
BILIRUBIN TOTAL: 1.2 mg/dL (ref 0.3–1.2)
BLOOD UREA NITROGEN: 35 mg/dL — ABNORMAL HIGH (ref 9–23)
BUN / CREAT RATIO: 8
CALCIUM: 9.7 mg/dL (ref 8.7–10.4)
CHLORIDE: 108 mmol/L — ABNORMAL HIGH (ref 98–107)
CO2: 24 mmol/L (ref 20.0–31.0)
CREATININE: 4.15 mg/dL — ABNORMAL HIGH
EGFR CKD-EPI (2021) MALE: 16 mL/min/{1.73_m2} — ABNORMAL LOW (ref >=60)
GLUCOSE RANDOM: 187 mg/dL — ABNORMAL HIGH (ref 70–179)
POTASSIUM: 3.9 mmol/L (ref 3.4–4.8)
PROTEIN TOTAL: 7.5 g/dL (ref 5.7–8.2)
SODIUM: 141 mmol/L (ref 135–145)

## 2022-07-26 LAB — CBC W/ AUTO DIFF
BASOPHILS ABSOLUTE COUNT: 0.1 10*9/L (ref 0.0–0.1)
BASOPHILS RELATIVE PERCENT: 0.7 %
EOSINOPHILS ABSOLUTE COUNT: 0.4 10*9/L (ref 0.0–0.5)
EOSINOPHILS RELATIVE PERCENT: 5.5 %
HEMATOCRIT: 37.7 % — ABNORMAL LOW (ref 39.0–48.0)
HEMOGLOBIN: 12.7 g/dL — ABNORMAL LOW (ref 12.9–16.5)
LYMPHOCYTES ABSOLUTE COUNT: 1.3 10*9/L (ref 1.1–3.6)
LYMPHOCYTES RELATIVE PERCENT: 18.2 %
MEAN CORPUSCULAR HEMOGLOBIN CONC: 33.7 g/dL (ref 32.0–36.0)
MEAN CORPUSCULAR HEMOGLOBIN: 27.1 pg (ref 25.9–32.4)
MEAN CORPUSCULAR VOLUME: 80.5 fL (ref 77.6–95.7)
MEAN PLATELET VOLUME: 6.9 fL (ref 6.8–10.7)
MONOCYTES ABSOLUTE COUNT: 0.6 10*9/L (ref 0.3–0.8)
MONOCYTES RELATIVE PERCENT: 8.8 %
NEUTROPHILS ABSOLUTE COUNT: 4.9 10*9/L (ref 1.8–7.8)
NEUTROPHILS RELATIVE PERCENT: 66.8 %
PLATELET COUNT: 277 10*9/L (ref 150–450)
RED BLOOD CELL COUNT: 4.68 10*12/L (ref 4.26–5.60)
RED CELL DISTRIBUTION WIDTH: 13.8 % (ref 12.2–15.2)
WBC ADJUSTED: 7.3 10*9/L (ref 3.6–11.2)

## 2022-07-26 LAB — PROTIME-INR
INR: 1.05
PROTIME: 11.7 s (ref 9.9–12.6)

## 2022-07-26 LAB — HIGH SENSITIVITY TROPONIN I - SERIAL: HIGH SENSITIVITY TROPONIN I: 121 ng/L (ref <=53)

## 2022-07-27 ENCOUNTER — Ambulatory Visit: Admit: 2022-07-27 | Discharge: 2022-07-27 | Discharge status: Against Medical Advice | Payer: PRIVATE HEALTH INSURANCE

## 2022-07-27 ENCOUNTER — Emergency Department: Admit: 2022-07-27 | Discharge: 2022-07-27 | Discharge status: Against Medical Advice | Payer: PRIVATE HEALTH INSURANCE

## 2022-07-27 MED FILL — OZEMPIC 1 MG/DOSE (4 MG/3 ML) SUBCUTANEOUS PEN INJECTOR: SUBCUTANEOUS | 28 days supply | Qty: 3 | Fill #2

## 2022-07-27 NOTE — Unmapped (Signed)
Bed: HALL-01A  Expected date:   Expected time:   Means of arrival:   Comments:

## 2022-07-27 NOTE — Unmapped (Signed)
ED Progress Note    I was notified by lab at 8:20 AM that patient has had a positive troponin.  Patient was called by prior physician and notified of abnormal results to return to the emergency department for further assessment.  I discussed this with the patient over phone, and informed him of the positive results.  He states that he would left without being seen because the computers were down .  Of note patient has had positive troponins in the past, and his troponin today is below his prior.  However given his chest pain, I discussed that it is important that he return to the emergency department for a complete reassessment, repeat evaluation with repeat troponin and continued monitoring/repeat electrocardiogram.  He states that he is not sure if he will return to the emergency department.

## 2022-07-27 NOTE — Unmapped (Signed)
ED Progress Note    I received notification from lab tech that troponin was 121.  This patient left without being seen after triage last night.  His triage was concerning for chest pain and shortness of breath and A-fib.    I attempted to call him.  He did not answer, it went to voicemail, so left a message requesting him to return to the emergency department or call the emergency department back.    I have sent a message to the follow-up nurse to continue to attempt to contact him.

## 2022-07-27 NOTE — Unmapped (Signed)
Upcoming Appt:  Future Appointments   Date Time Provider Department Center   08/01/2022  8:45 AM Laural Benes [place] [facility]   08/04/2022  8:30 AM Mel Almond, MD [facility]   08/11/2022  2:00 PM [name], MD [facility]ana[facility]   09/26/2022  8:00 AM Detwiler, Lillia Carmel, MD Terressa Koyan[facility]   09/26/2022  9:00 AM Piedad Climes, RD/LDN [facility]       Recent:   What is the date of your last related visit?  None   Related acute medications Rx'd:  none  Home treatment tried:  none      Relevant:   Allergies: Patient has no known allergies.  Medications: amlodipine, spironolactone, lasix, coreg, ozempic, novolog and detemir insulin, prograf, cellcept    Health History: CAD, HTN, type 2 diabetes, CKD, liver transplant, CHF   Weight: n/a      [place] [facility] patients only:  What was the date of your last cancer treatment (mm/dd/yy)?: n/a  Was the treatment oral or infusion?: n/a  Are you currently on TVEC (yes/no)?: n/a     Reason for Disposition   [1] Chest pain lasts > 5 minutes AND [2] age > 31    Answer Assessment - Initial Assessment Questions  1. DESCRIPTION: Please describe your heart rate or heartbeat that you are having (e.g., fast/slow, regular/irregular, skipped or extra beats, palpitations)      Apple watch sent notification he was in irregular heart rate suggestive of atrial fibrillation    2. ONSET: When did it start? (Minutes, hours or days)       One notification at 10:37 a.m. and 6:05 p.m.   3. DURATION: How Channon Ambrosini does it last (e.g., seconds, minutes, hours)      Unsure aysmptommatic   4. PATTERN Does it come and go, or has it been constant since it started?  Does it get worse with exertion?   Are you feeling it now?      Comes and going   5. TAP: Using your hand, can you tap out what you are feeling on a chair or table in front of you, so that I can hear? (Note: not all patients can do this)        N/a  6. HEART RATE: Can you tell me your heart rate? How many beats in 15 seconds?  (Note: not all patients can do this)        No is driving and when stopped unable to get pulse   7. RECURRENT SYMPTOM: Have you ever had this before? If Yes, ask: When was the last time? and What happened that time?       No   8. CAUSE: What do you think is causing the palpitations?      Unsure   9. CARDIAC HISTORY: Do you have any history of heart disease? (e.g., heart attack, angina, bypass surgery, angioplasty, arrhythmia)       MI Type 2, CAD, HTN.  Denies heart attack, angina, bypass surgery, angioplasty, arrhythmia, CHF  10. OTHER SYMPTOMS: Do you have any other symptoms? (e.g., dizziness, chest pain, sweating, difficulty breathing)        No and denies dizziness, chest pain, sweating, difficulty breathing -initially denied chest pain, further along in triage said he had chest pressure  11. PREGNANCY: Is there any chance you are pregnant? When was your last menstrual period?        N/a    Answer Assessment -  Initial Assessment Questions  1. LOCATION: Where does it hurt?        Chest pressure in middle of chest   2. RADIATION: Does the pain go anywhere else? (e.g., into neck, jaw, arms, back)      Also feels it in mid back   3. ONSET: When did the chest pain begin? (Minutes, hours or days)       6:00 p.m.   4. PATTERN: Does the pain come and go, or has it been constant since it started?  Does it get worse with exertion?       Constant   5. DURATION: How Solveig Fangman does it last (e.g., seconds, minutes, hours)      Constant   6. SEVERITY: How bad is the pain?  (e.g., Scale 1-10; mild, moderate, or severe)     - MILD (1-3): doesn't interfere with normal activities      - MODERATE (4-7): interferes with normal activities or awakens from sleep     - SEVERE (8-10): excruciating pain, unable to do any normal activities        1-2   7. CARDIAC RISK FACTORS: Do you have any history of heart problems or risk factors for heart disease? (e.g., angina, prior heart attack; diabetes, high blood pressure, high cholesterol, smoker, or strong family history of heart disease)     Denies angina, smoking, high cholesterol and strong family history of heart disease.  Has diabetes, HTN, CHF, CAD  8. PULMONARY RISK FACTORS: Do you have any history of lung disease?  (e.g., blood clots in lung, asthma, emphysema, birth control pills)      No and denies blood clots, asthma and emyphsema   9. CAUSE: What do you think is causing the chest pain?     Unsure   10. OTHER SYMPTOMS: Do you have any other symptoms? (e.g., dizziness, nausea, vomiting, sweating, fever, difficulty breathing, cough)        No and denies dizziness, nausea, vomiting, sweating, fever, difficulty breathing. Coughing a few times a day.   11. PREGNANCY: Is there any chance you are pregnant? When was your last menstrual period?        N/a    Protocols used: Heart Rate and Heartbeat Questions-A-AH, Chest Pain-A-AH

## 2022-07-27 NOTE — Unmapped (Signed)
Pt coming in POV for chest pressure and SOB that started today @ 1000. Last episode was @ 1800. Hx of CHF. Pt stated My apple watch has detected A-fib and I don't know what that is. I do have a history of CHF. PCP advised to come in.

## 2022-07-27 NOTE — Unmapped (Signed)
Pt presents to ED with C/O chest pressure and SOB, pt states the pain started approx 1 hour ago. Pt states he has not experienced this pain before and came to the ED because his apple watch showed he may be in Afib. Pt has a HX of heart failure and takes multiple cardiac medications. Denies taking anything for the pain.

## 2022-07-27 NOTE — Unmapped (Signed)
I received a follow-up request for this patient due to elevated troponin and patient LWBS. Dr. Claretha Cooper was unable to reach patient and had left a message. On review of chart, Dr. Tressie Ellis has spoken with patient regarding his labs. See provider's note. No further action needed.

## 2022-07-29 NOTE — Unmapped (Signed)
LWBS/AMA Patient Follow-Up Call    Contacted patient at [phone] to discuss ED visit after LWBS after triage    How are you feeling since your visit to the ED on  07/26/2022 ? Doing better    Have your symptoms gotten better?has improved    Patient at this time stating I'm going to give you a call back and phone call was disconnected.

## 2022-08-01 ENCOUNTER — Ambulatory Visit: Admit: 2022-08-01 | Discharge: 2022-08-02 | Payer: PRIVATE HEALTH INSURANCE

## 2022-08-01 DIAGNOSIS — R7989 Other specified abnormal findings of blood chemistry: Principal | ICD-10-CM

## 2022-08-01 DIAGNOSIS — N184 Chronic kidney disease, stage 4 (severe): Principal | ICD-10-CM

## 2022-08-01 DIAGNOSIS — I4891 Unspecified atrial fibrillation: Principal | ICD-10-CM

## 2022-08-01 DIAGNOSIS — I10 Essential (primary) hypertension: Principal | ICD-10-CM

## 2022-08-01 DIAGNOSIS — I5032 Chronic diastolic (congestive) heart failure: Principal | ICD-10-CM

## 2022-08-01 LAB — BASIC METABOLIC PANEL
ANION GAP: 9 mmol/L (ref 5–14)
BLOOD UREA NITROGEN: 44 mg/dL — ABNORMAL HIGH (ref 9–23)
BUN / CREAT RATIO: 9
CALCIUM: 9.2 mg/dL (ref 8.7–10.4)
CHLORIDE: 110 mmol/L — ABNORMAL HIGH (ref 98–107)
CO2: 22.4 mmol/L (ref 20.0–31.0)
CREATININE: 4.74 mg/dL — ABNORMAL HIGH
EGFR CKD-EPI (2021) MALE: 14 mL/min/{1.73_m2} — ABNORMAL LOW (ref >=60)
GLUCOSE RANDOM: 164 mg/dL (ref 70–179)
POTASSIUM: 4.1 mmol/L (ref 3.4–4.8)
SODIUM: 141 mmol/L (ref 135–145)

## 2022-08-01 LAB — HIGH SENSITIVITY TROPONIN I - SINGLE: HIGH SENSITIVITY TROPONIN I: 113 ng/L (ref <=53)

## 2022-08-01 NOTE — Unmapped (Signed)
[facility] at [facility]     Reason for visit: Follow up hypertension    Questions / Concerns that need to be addressed: None        Diabetes:  Regularly checking blood sugars?: yes  If yes, when? Complete log for past 7 days  Date Before Breakfast After Breakfast Before Lunch After Lunch Before Dinner After Dinner Before Bed    2/06  207                                                                                                                               Hypertension:  Have blood pressure cuff at home?: no  Regularly checking blood pressure?: no  If patient has a  record of recent blood pressures, please enter into flowsheets using this link     PTHomeBP          Omron BPs (complete if screening BP has a systolic  > 129 or diastolic > 79)  BP#1 151/89 p 65   BP#2 149/90 p 64  BP#3 153/93 p 64    Average BP 151/91 p 64  (please note this as a comment in vitals)         Allergies reviewed: Yes    Medication reviewed: Yes  Pended refills? No        HCDM reviewed and updated in Epic:    We are working to make sure all of our patients??? wishes are updated in Epic and part of that is documenting a Environmental health practitioner for each patient  A Health Care Decision Maker is someone you choose who can make health care decisions for you if you are not able - who would you most want to do this for you????  is already up to date.        BPAs completed:        COVID-19 Vaccine Summary  Which COVID-19 Vaccine was administered  Type:  Dates Given:          Date last mAB infusion:  07/18/2019          If no: Are you interested in scheduling? Declines vaccine    Immunization History   Administered Date(s) Administered    Hepatitis A Vaccine - Unspecified Formulation 10/06/2011, 03/30/2014    Hepatitis B vaccine, pediatric/adolescent dosage, 10/06/2011, 11/06/2011, 03/30/2014    INFLUENZA QUAD HIGH DOSE 11YRS+(FLUZONE) 04/08/2013    INFLUENZA TIV (TRI) 26MO+ W/ PRESERV (IM) 03/15/2011, 03/30/2014, 04/15/2015, 03/27/2016, 03/27/2017, 04/24/2018    Influenza Vaccine Quad (IIV4 PF) 6-44mo 04/15/2020    Influenza Vaccine Quad(IM)6 MO-Adult(PF) 04/15/2015, 03/27/2016, 03/27/2017, 04/24/2018, 04/28/2022    Influenza Virus Vaccine, unspecified formulation 07/03/2019    PNEUMOCOCCAL POLYSACCHARIDE 23-VALENT 06/22/2016, 03/27/2017    PPD Test 03/07/2012, 05/12/2012    Pneumococcal Conjugate 13-Valent 05/25/2011, 03/27/2016    SHINGRIX-ZOSTER VACCINE (HZV),RECOMBINANT,ADJUVANTED(IM) 03/27/2017, 04/24/2018    TdaP 08/11/2008, 06/11/2016       __________________________________________________________________________________________  SCREENINGS COMPLETED IN FLOWSHEETS    HARK Screening       AUDIT       PHQ2       PHQ9          Link for Multi-language PHQ/GAD screeners: [email or web address].com/[email or web address]    P4 Suicidality Screener                GAD7       COPD Assessment       Falls Risk       .imcres

## 2022-08-01 NOTE — Unmapped (Signed)
Assessment and Plan:     Diagnosis ICD-10-CM Associated Orders   1. Primary hypertension  I10 Basic Metabolic Panel      2. Chronic heart failure with preserved ejection fraction (CMS-HCC)  I50.32       3. CKD (chronic kidney disease) stage 4, GFR 15-29 ml/min (CMS-HCC)  N18.4       4. Elevated troponin  R79.89 hsTroponin I (single, no delta)     hsTroponin I (single, no delta)      5. Atrial fibrillation, unspecified type (CMS-HCC)  I48.91 External ECG-3 days to 7 days (ZIO XT)        HTN uncontrolled  Continue amlodipine 5 mg daily (intolerant with swelling to higher doses)  Continue carvedilol 6.25 mg bid  Continue spironolactone 25 mg daily (checking labs today)  Will increase spironolactone if able based on labs today  Next steps for HTN consider thiazide?  Hydralazine?    Elevated troponin and evidence of afib in ED  Left due to down computers  No chest pain, sob, jaw pain, sweating or arm pain today  HR is 64  Will check labs today including troponin to trend  If increased, will direct him to ED  Place zio to discern afib burden (new) and consider DOAC    If any chest pain, sob, sweating, arm pain, jaw pain, dizziness, report directly to ED or call 911    Sees pcp in a few days, keep appointment    I spent a total of 30 min total including pre intra and post visit activities    ______________________________________________________________________    CC:  Follow-up hypertension    Patient Active Problem List   Diagnosis    Hypertension    Acanthosis nigricans    Type 2 diabetes mellitus with hyperglycemia, with long-term current use of insulin (CMS-HCC)    H/O gastroesophageal reflux (GERD)    Liver replaced by transplant (CMS-HCC)    Class 3 obesity (CMS-HCC)    Obstructive sleep apnea syndrome    Proteinuria due to type 2 diabetes mellitus (CMS-HCC)    Immunosuppression due to drug therapy (CMS-HCC)    CKD (chronic kidney disease) stage 4, GFR 15-29 ml/min (CMS-HCC)    Type 2 MI (myocardial infarction) (CMS-HCC)    Coronary artery disease    (HFpEF) heart failure with preserved ejection fraction (CMS-HCC)       HPI:  Richard [name] is a 57 yo male with PMH HTN, DM II, obesity, OSA, CKD stage 4, MI, CAD and HFpEF here for HTN follow up.    Patient was seen in ED 2/1 for chest pain, but left before full eval due to computers being down.  His troponin was elevated and they attempted to have him return    At last pcp visit, spironolactone added and potassium stopped    Today he notes no chest pain today, no sob, sweating, jaw pain, arm pain.      Day went to hospital, watch said afib.  Had afib there.     Not since on his watch    No dizziness or lightheadedness      ROS: ROS    Exam:  BP 151/91 (BP Site: L Arm, BP Position: Sitting, BP Cuff Size: X-Large) Comment: average - Pulse 64  - Temp 36.7 ??C (98.1 ??F) (Temporal)  - Resp 18  - Ht 177.8 cm (5' 10)  - Wt (!) 132.5 kg (292 lb)  - SpO2 98%  - BMI 41.90 kg/m??  Gen:  Pleasant NAD  CV:  RRR no mrg  Lungs:  CTA bl no wheeze, crackles  Ext:  No LE edema  Psych:  A&O x 3, appropriate affect, engaged    Medication adherence and barriers to the treatment plan have been addressed. Opportunities to optimize healthy behaviors have been discussed. Patient / caregiver voiced understanding.

## 2022-08-04 ENCOUNTER — Ambulatory Visit: Admit: 2022-08-04 | Discharge: 2022-08-04 | Payer: PRIVATE HEALTH INSURANCE

## 2022-08-04 DIAGNOSIS — I5032 Chronic diastolic (congestive) heart failure: Principal | ICD-10-CM

## 2022-08-04 DIAGNOSIS — N184 Chronic kidney disease, stage 4 (severe): Principal | ICD-10-CM

## 2022-08-04 DIAGNOSIS — Z794 Long term (current) use of insulin: Principal | ICD-10-CM

## 2022-08-04 DIAGNOSIS — Z5181 Encounter for therapeutic drug level monitoring: Principal | ICD-10-CM

## 2022-08-04 DIAGNOSIS — E1165 Type 2 diabetes mellitus with hyperglycemia: Principal | ICD-10-CM

## 2022-08-04 DIAGNOSIS — E612 Magnesium deficiency: Principal | ICD-10-CM

## 2022-08-04 DIAGNOSIS — Z944 Liver transplant status: Principal | ICD-10-CM

## 2022-08-04 LAB — CBC W/ AUTO DIFF
BASOPHILS ABSOLUTE COUNT: 0 10*9/L (ref 0.0–0.1)
BASOPHILS RELATIVE PERCENT: 0.5 %
EOSINOPHILS ABSOLUTE COUNT: 0.4 10*9/L (ref 0.0–0.5)
EOSINOPHILS RELATIVE PERCENT: 5.5 %
HEMATOCRIT: 38.4 % — ABNORMAL LOW (ref 39.0–48.0)
HEMOGLOBIN: 12.9 g/dL (ref 12.9–16.5)
LYMPHOCYTES ABSOLUTE COUNT: 1.2 10*9/L (ref 1.1–3.6)
LYMPHOCYTES RELATIVE PERCENT: 15 %
MEAN CORPUSCULAR HEMOGLOBIN CONC: 33.7 g/dL (ref 32.0–36.0)
MEAN CORPUSCULAR HEMOGLOBIN: 27.1 pg (ref 25.9–32.4)
MEAN CORPUSCULAR VOLUME: 80.6 fL (ref 77.6–95.7)
MEAN PLATELET VOLUME: 7.3 fL (ref 6.8–10.7)
MONOCYTES ABSOLUTE COUNT: 0.6 10*9/L (ref 0.3–0.8)
MONOCYTES RELATIVE PERCENT: 7 %
NEUTROPHILS ABSOLUTE COUNT: 5.8 10*9/L (ref 1.8–7.8)
NEUTROPHILS RELATIVE PERCENT: 72 %
NUCLEATED RED BLOOD CELLS: 0 /100{WBCs} (ref <=4)
PLATELET COUNT: 245 10*9/L (ref 150–450)
RED BLOOD CELL COUNT: 4.77 10*12/L (ref 4.26–5.60)
RED CELL DISTRIBUTION WIDTH: 13.8 % (ref 12.2–15.2)
WBC ADJUSTED: 8 10*9/L (ref 3.6–11.2)

## 2022-08-04 LAB — COMPREHENSIVE METABOLIC PANEL
ALBUMIN: 4.1 g/dL (ref 3.4–5.0)
ALKALINE PHOSPHATASE: 112 U/L (ref 46–116)
ALT (SGPT): 14 U/L (ref 10–49)
ANION GAP: 9 mmol/L (ref 5–14)
AST (SGOT): 13 U/L (ref <=34)
BILIRUBIN TOTAL: 1.3 mg/dL — ABNORMAL HIGH (ref 0.3–1.2)
BLOOD UREA NITROGEN: 41 mg/dL — ABNORMAL HIGH (ref 9–23)
BUN / CREAT RATIO: 9
CALCIUM: 9.8 mg/dL (ref 8.7–10.4)
CHLORIDE: 109 mmol/L — ABNORMAL HIGH (ref 98–107)
CO2: 23.5 mmol/L (ref 20.0–31.0)
CREATININE: 4.6 mg/dL — ABNORMAL HIGH
EGFR CKD-EPI (2021) MALE: 14 mL/min/{1.73_m2} — ABNORMAL LOW (ref >=60)
GLUCOSE RANDOM: 149 mg/dL (ref 70–179)
POTASSIUM: 4.5 mmol/L (ref 3.4–4.8)
PROTEIN TOTAL: 7.8 g/dL (ref 5.7–8.2)
SODIUM: 141 mmol/L (ref 135–145)

## 2022-08-04 LAB — B-TYPE NATRIURETIC PEPTIDE: B-TYPE NATRIURETIC PEPTIDE: 709.88 pg/mL — ABNORMAL HIGH (ref <=100)

## 2022-08-04 LAB — PHOSPHORUS: PHOSPHORUS: 4.2 mg/dL (ref 2.4–5.1)

## 2022-08-04 LAB — HEMOGLOBIN A1C
ESTIMATED AVERAGE GLUCOSE: 192 mg/dL
HEMOGLOBIN A1C: 8.3 % — ABNORMAL HIGH (ref 4.8–5.6)

## 2022-08-04 LAB — BILIRUBIN, DIRECT: BILIRUBIN DIRECT: 0.5 mg/dL — ABNORMAL HIGH (ref 0.00–0.30)

## 2022-08-04 LAB — MAGNESIUM: MAGNESIUM: 1.9 mg/dL (ref 1.6–2.6)

## 2022-08-04 LAB — GAMMA GT: GAMMA GLUTAMYL TRANSFERASE: 38 U/L

## 2022-08-04 MED ORDER — EMPAGLIFLOZIN 10 MG TABLET
ORAL_TABLET | Freq: Every day | ORAL | 3 refills | 30 days | Status: CN
Start: 2022-08-04 — End: ?

## 2022-08-04 MED ORDER — FUROSEMIDE 40 MG TABLET
ORAL_TABLET | Freq: Two times a day (BID) | ORAL | 6 refills | 30 days | Status: CP
Start: 2022-08-04 — End: 2023-03-02

## 2022-08-04 NOTE — Unmapped (Deleted)
Chief concern/reason for visit: {CC/Dx Smartlinks:21093521}    Assessment/Plan:     {A/P Smartlinks:21093127}    No follow-ups on file.      Subjective     HPI:  ***       Objective     There were no vitals taken for this visit.    Physical Exam:  {Physical Exam Smartlinks:21093508}    {Labs/Imaging/Pathology (Optional):21094520}    {MDM Documentation Elements (Optional) :21095089}    {Time Attest / VideoTeleDisclaimer / Interpreter (Optional):21094932}

## 2022-08-04 NOTE — Unmapped (Signed)
Internal Medicine Clinic Visit    Reason for visit: Follow-up    A/P: 57 year old male with PMH of OLT 2013, T2DM, HFpEF and hypertension         1. CKD (chronic kidney disease) stage 4, GFR 15-29 ml/min (CMS-HCC)    2. Type 2 diabetes mellitus with hyperglycemia, with long-term current use of insulin (CMS-HCC)    3. Chronic heart failure with preserved ejection fraction (CMS-HCC)        CKD-I have asked him again to make nephrology appointment with Dr. Galen Manila.  Creatinine stable on recheck again today shows borderline stage IV/stage V CKD.  With this, unable to start Jardiance for both diabetes and heart failure.  Different classes of antihypertensives and trying to control blood pressure though continues to be uncontrolled with systolic 160s in the office.  Will increase Lasix 40 mg twice daily and hope for quick follow-up with nephrology and me in 2 months    2.  Type 2 diabetes on long-term insulin-hemoglobin A1c approximately 8% this visit.  Did not have a lot of time to make significant adjustments so we will address next visit, due to complicated comorbidities and other barriers to care requested hour-long appointment follow-up with him in the future    3.  HFpEF-BNP still significantly elevated.  With this and physical exam strongly feel he is hypervolemic. Increase diuretic regimen to Lasix 40 mg twice daily.  If he does not have significant improvement with his weight, symptoms of dyspnea on exertion and swelling he mostly carries in his legs would likely try torsemide or Bumex in the future. Last saw Cardiologist outpatient in 2013.  They were consulted on him inpatient 08/2021 and at that time recommended continuing daily aspirin 81 mg as he was being evaluated for type II MI and elevated troponin.  At that time they felt like he would benefit from outpatient cardiology follow-up and risk/benefits analysis of what I presume would be left heart catheterization.  Will discuss referral next visit      Return in about 2 months (around 10/03/2022).    Staffed with Dr. Leta Baptist, seen and discussed    __________________________________________________________    HPI:    ED- went 1/31 b/c his apple watch had afib on it twice on one day.  At that time he was SOB and having chest pressure.  Our computers were down for 6 hours and so he just left and these things resolved on their own.  He still having shortness of breath and dyspnea with pretty minimal exertion but does not really think this is much worse.  He still thinks he is making a lot of urine with his Lasix although reports only 1 or 2 trips to the bathroom within the first 1 to 2 hours that he is taking it.  He thinks his weights been overall stable and did not notice much difference before when he was switched to 40 mg Lasix twice daily.    Spironolactone was recently restarted for him at 25 mg and then he was seen at same-day clinic for an evaluation of his volume status.  They rechecked his renal function which has been worsening over the past year and it was relatively stable.  He noted his EGFR had dropped from 16-14 and he was worried about this so he did not increase his spironolactone to 50 mg as had been recommended by a provider here in our clinic.    He continue to take amlodipine, coreg, lasix, spironolactone.  Swelling  in his legs is about the same.  Very notable up to about the level of his knees but never higher than that.  Worse at the end of the day.  He has not had any nausea or decrease in appetite and has been eating no problem.  Is an upcoming appointment with Dr. Margaretmary Bayley from transplant nephrology for kidney transplant evaluation but at his last same-day clinic visit he been asked to make an appointment with nephrology with his nephrologist Dr. Galen Manila  __________________________________________________________        Medications:  Reviewed in EPIC  __________________________________________________________    Physical Exam: Vital Signs:  Vitals:    08/04/22 0831   BP: 161/97   BP Site: L Arm   BP Position: Sitting   BP Cuff Size: Large   Pulse: 67   Resp: 18   Temp: 36.4 ??C (97.5 ??F)   TempSrc: Temporal   SpO2: 98%   Weight: (!) 131.5 kg (289 lb 12.8 oz)          PTHomeBP    The patient???s Average Home Blood Pressure during the last two weeks is :   /   based on  readings            Gen: Well appearing, NAD, has obesity  CV: RRR, no murmurs or rub  Pulm: CTA bilaterally, no crackles or wheezes  Abd: Soft, NTND, normal BS.   Ext: +2 pitting edema three quarters up his leg, bilaterally  Neck: JVD on the right side 1 to 2 cm above clavicle seated at 90 degrees    PHQ-9 Score:     GAD-7 Score:       Medication adherence and barriers to the treatment plan have been addressed. Opportunities to optimize healthy behaviors have been discussed. Patient / caregiver voiced understanding.

## 2022-08-04 NOTE — Unmapped (Signed)
Screening BP -    BP # 1.163/100- pulse 69    BP.# 2. 161/97- pulse 67    BP # 3. 160-/93- pulse 65     Average  Blood pressure: 161-97- pulse- 68

## 2022-08-04 NOTE — Unmapped (Signed)
Call [phone] to make appointment w/ Dr Galen Manila at [facility] (in next 2-3 weeks or as soon as you can)     Take furosemide (lasix 40 mg twice a day)

## 2022-08-05 LAB — TACROLIMUS LEVEL, TROUGH: TACROLIMUS, TROUGH: 2.6 ng/mL — ABNORMAL LOW (ref 5.0–15.0)

## 2022-08-05 NOTE — Unmapped (Signed)
I reviewed with the resident the medical history and the resident’s findings on physical examination and I examined the patient.  I discussed with the resident the patient’s diagnosis and concur with the treatment plan as documented in the resident note. Jordana Dugue C Jaylene Schrom, MD

## 2022-08-08 NOTE — Unmapped (Signed)
I reviewed with the resident the medical history and the resident’s findings on physical examination and I examined the patient.  I discussed with the resident the patient’s diagnosis and concur with the treatment plan as documented in the resident note. Mirza Kidney C Phyllip Claw, MD

## 2022-08-09 NOTE — Unmapped (Signed)
This patient was seen and staffed with Dr. Leta Baptist and he has signed the note. It was routed to me in error.

## 2022-08-10 NOTE — Unmapped (Unsigned)
[facility] at [facility]  Initial Consultation Visit    Reason for Visit: {[id number]}  Referring Provider: Linnell Fulling   Primary Care Provider: Mel Almond, MD    Assessment & Plan: Richard [name] is a 57 y.o. male with a PMHx of HTN, CAD, HFpEF, T2DM, CKD, OSA, and s/p liver tx for fatty liver disease (03/02/2012) on IS that is seen in consultation at the request of Linnell Fulling for abdominal pain.     I spent a total of *** minutes on the date of this encounter in the delivery of care to this patient.     Patient {[id number]} Dr. Marland Kitchen who is in agreement with above assessment and plan.    [name], MD MMSc   Fellow Physician - Division of Gastroenterology & Hepatology  [employer]       No follow-ups on file.       Subjective   HPI: Richard [name] is a 57 y.o. male with a PMHx of HTN, CAD, HFpEF, T2DM, CKD, OSA, and s/p liver tx for fatty liver disease (03/02/2012) on IS that is seen in consultation at the request of Linnell Fulling for abdominal pain.       Was admitted on 08/2021 for severe abdominal pain associated with chest tightness in the setting of elevated blood pressures and type 2 NSTEMI (see 09/01/21 dc summ for full details). Had LFT changes that were attributed to htn as well.           GI ROS:   {Gi ros:13603}    Review of Systems:  10 systems were reviewed and are negative unless otherwise mentioned in the HPI    Allergies:  Patient has no known allergies.    Medications:   Prior to Admission medications    Medication Dose, Route, Frequency   amLODIPine (NORVASC) 5 MG tablet 5 mg, Oral, Daily (standard)   atorvastatin (LIPITOR) 40 MG tablet 40 mg, Oral, Nightly, Med to prevent heart attacks and help cholesterol   blood sugar diagnostic (CONTOUR NEXT TEST STRIPS) Strp Use to check blood sugar as directed with insulin 4 times a day & for symptoms of high or low blood sugar.  Patient not taking: Reported on 08/04/2022 blood-glucose meter Misc USE AS DIRECTED  Patient not taking: Reported on 08/04/2022   blood-glucose meter,continuous (DEXCOM G6 RECEIVER) Misc 1 each, Miscellaneous, Daily, Dispense 1 receiver annually.  Sent to [facility]   blood-glucose sensor (DEXCOM G6 SENSOR) Devi Use as directed to monitor blood glucose   blood-glucose sensor (DEXCOM G6 SENSOR) Devi 1 each, Miscellaneous, Daily, [facility]   blood-glucose transmitter (DEXCOM G6 TRANSMITTER) Devi Use as directed to monitor blood glucose   blood-glucose transmitter (DEXCOM G6 TRANSMITTER) Devi 1 each, Miscellaneous, Every 3 months, [facility] pharmacy   carvedilol (COREG) 6.25 MG tablet 6.25 mg, Oral, 2 times a day (standard)   CHOLECALCIFEROL, VITAMIN D3, (VITAMIN D3 ORAL) 2,000 Units, Daily (standard)   furosemide (LASIX) 40 MG tablet 40 mg, Oral, 2 times a day (standard), Take Furosemide aka Lasix 40 mg twice a day for 3 days, starting at 7 am and then the next dose at 1pm <BR>Then go back to taking Lasix 40 mg once a day   glucagon spray (BAQSIMI) 3 mg/actuation Spry Use 1 spray into the left nostril once as needed for up to 1 dose.  Patient not taking: Reported on 05/22/2022   insulin aspart (NOVOLOG FLEXPEN) 100 unit/mL (3 mL) injection pen  40 Units, Subcutaneous, 3 times a day (AC)   insulin detemir U-100 (LEVEMIR) 100 unit/mL (3 mL) injection pen 20 Units, Subcutaneous, Nightly   lancets Misc Use to check blood sugar as directed with insulin 4 times a day & for symptoms of high or low blood sugar.  Patient not taking: Reported on 08/04/2022   magnesium oxide (MAG-OX) 400 mg (241.3 mg elemental magnesium) tablet 400 mg, Oral   multivitamin (TAB-A-VITE/THERAGRAN) per tablet 1 tablet, Oral, Daily (standard)   mycophenolate (CELLCEPT) 250 mg capsule 250 mg, Oral, 2 times a day (standard)   pen needle, diabetic 32 gauge x 5/32 (4 mm) Ndle Use with insulin up to 4 times a day as needed.  Patient not taking: Reported on 08/04/2022   semaglutide (OZEMPIC) 1 mg/dose (4 mg/3 mL) PnIj injection 1 mg, Subcutaneous, Every 7 days   spironolactone (ALDACTONE) 25 MG tablet 25 mg, Oral, Daily (standard)   tacrolimus (PROGRAF) 0.5 MG capsule 1 mg, Oral, 2 times a day       Medical History:  Past Medical History:   Diagnosis Date    COVID-19 07/18/2019    Family history of malignant neoplasm of prostate 06/23/2015    Hepatic cirrhosis (CMS-HCC) 06/23/2015    Overview:  Secondary to NASH; followed by Dr. Yevonne Pax, GI.  Last Assessment & Plan:  Relevant Hx: Course: Daily Update: Today's Plan:     Hypertension     Hypogonadism in male 02/12/2014    Liver cirrhosis secondary to NASH (nonalcoholic steatohepatitis) (CMS-HCC)     s/p liver transplant 2013       Surgical History:  Past Surgical History:   Procedure Laterality Date    liver transpant      LIVER TRANSPLANTATION  2013    PR COLONOSCOPY W/BIOPSY SINGLE/MULTIPLE N/A 08/13/2018    Procedure: COLONOSCOPY, FLEXIBLE, PROXIMAL TO SPLENIC FLEXURE; WITH BIOPSY, SINGLE OR MULTIPLE;  Surgeon: Neysa Hotter, MD;  [facility];  Service: Gastroenterology    PR COLSC FLX W/RMVL OF TUMOR POLYP LESION SNARE TQ N/A 08/13/2018    Procedure: COLONOSCOPY FLEX; W/REMOV TUMOR/LES BY SNARE;  Surgeon: Neysa Hotter, MD;  [facility]: [facility];  Service: Gastroenterology    PR UPPER GI ENDOSCOPY,BIOPSY N/A 07/13/2015    Procedure: UGI ENDOSCOPY; WITH BIOPSY, SINGLE OR MULTIPLE;  Surgeon: Joaquin Music, MD;  Location: [facility];  Service: Gastroenterology       Social History:  Social History     Tobacco Use    Smoking status: Former     Passive exposure: Past    Smokeless tobacco: Never   Vaping Use    Vaping status: Never Used   Substance Use Topics    Alcohol use: No    Drug use: No       Family History:  Family History   Problem Relation Age of Onset    Diabetes Father     Liver disease Maternal Uncle     Diabetes Paternal Grandfather     Melanoma Neg Hx     Basal cell carcinoma Neg Hx Squamous cell carcinoma Neg Hx     Kidney disease Neg Hx        Previous Abdominal Surgeries:  Liver tx 2013    Colon Cancer Screening Status:  Colonoscopy in 2020 as below               Objective   Vital Signs:  There were no vitals filed for this visit.  There is no height or weight on file to calculate BMI.    Physical Exam:   Gen: WDWN *** in NAD, answers questions appropriately  Eyes: Sclera anicteric, EOMI, PERRLA,  HENT: atraumatic, normocephalic, MMM. OP w/o erythema or exudate   Neck: no cervical lymphadenopathy or thyromegaly, no JVD  Heart: RRR, S1, S2, no M/R/G, no chest wall tenderness  Lungs: CTAB, no crackles or wheezes, no use of accessory muscles  Abdomen: Normoactive bowel sounds, soft, NTND, no rebound/guarding, no hepatosplenomegaly  Extremities: no clubbing, cyanosis, or edema: pulses are +2 in bilateral upper and lower extremities  Neuro: CN II-XI grossly intact, normal cerebellar function, normal gait. No focal deficits.  Skin:  No rashes, lesions on clothed exam  Psych: Alert and oriented, normal mood and affect.     Recent Labs:    Previous Endoscopy Procedures:    Recent GI Imaging Studies:       Colonoscopy 08/13/18:  Impression:        - The examined portion of the ileum was normal.                     - One 3 mm polyp in the cecum, removed with a cold biopsy                      forceps. Resected and retrieved.                     - One 6 mm polyp in the ascending colon, removed with a                      cold snare. Resected and retrieved.                     - Two 2 mm polyps in the rectum, removed with a cold                      biopsy forceps. Resected and retrieved.                     - The examination was otherwise normal    Diagnosis   A: Colon, cecum, biopsy  - Adenomatous polyp (1 fragment)  - Lymphoid nodule (1 fragment)  - Unremarkable colonic mucosa (1 fragment)     B: Colon, cecum, biopsy  - Serrated polyp with architectural features suspicious for sessile serrated polyp (1 fragment), negative for dysplasia       EGD 07/13/15:  Impression:        - Normal esophagus.                     - Normal stomach.                     - Normal examined duodenum. Biopsied.                     - The procedure was expedited due to patient agitation and                      difficulties with oxygenation.      Diagnosis   A: Small bowel, biopsy  - Duodenal mucosa with patchy minimally active duodenitis with foveolar metaplasia and patchy crypt apoptotic debris   - No increased intraepithelial lymphocytes identified  - No viral cytopathic effect or granuloma  identified  - See comment       B: Colon, biopsy  - Unremarkable colonic mucosa (multiple fragments)   - No increased crypt apoptotic debris identified  - No viral cytopathic effect or granuloma identified  - No lymphocytic or collagenous colitis identified       C: Colon, sigmoid, biopsy  - Hyperplastic polyp (3 fragments)  - Lymphoid aggregate (2 fragments)

## 2022-08-22 DIAGNOSIS — I5032 Chronic diastolic (congestive) heart failure: Principal | ICD-10-CM

## 2022-08-22 MED ORDER — OZEMPIC 1 MG/DOSE (4 MG/3 ML) SUBCUTANEOUS PEN INJECTOR
SUBCUTANEOUS | 2 refills | 28 days | Status: CP
Start: 2022-08-22 — End: ?
  Filled 2022-08-24: qty 3, 28d supply, fill #0

## 2022-08-23 DIAGNOSIS — I5032 Chronic diastolic (congestive) heart failure: Principal | ICD-10-CM

## 2022-08-23 MED ORDER — FUROSEMIDE 20 MG TABLET
ORAL_TABLET | Freq: Two times a day (BID) | ORAL | 2 refills | 30 days | Status: CP
Start: 2022-08-23 — End: 2023-08-23
  Filled 2022-08-24: qty 120, 30d supply, fill #0

## 2022-08-23 NOTE — Unmapped (Unsigned)
Spoke with patient who states that [facility] filled his Furosemide for one tablet once daily however this was increased at his last appointment. Please resend RX with the new SIG to [facility].

## 2022-09-12 MED ORDER — DEXCOM G6 SENSOR DEVICE
0 refills | 0 days
Start: 2022-09-12 — End: ?

## 2022-09-19 DIAGNOSIS — Z794 Long term (current) use of insulin: Principal | ICD-10-CM

## 2022-09-19 DIAGNOSIS — E119 Type 2 diabetes mellitus without complications: Principal | ICD-10-CM

## 2022-09-19 MED ORDER — DEXCOM G6 TRANSMITTER DEVICE
3 refills | 0 days | Status: CP
Start: 2022-09-19 — End: ?

## 2022-09-19 MED ORDER — DEXCOM G6 SENSOR DEVICE
Freq: Every day | 3 refills | 0 days | Status: CP
Start: 2022-09-19 — End: 2022-12-18

## 2022-09-19 NOTE — Unmapped (Signed)
Med refill

## 2022-09-21 IMAGING — CR DG CHEST 2V
2 series · 2 of 2 positions shown · non-contrast
Comparison: [REDACTED] Chest radiographs 05/15/2020
and earlier.

CLINICAL DATA: 54-year-old male with shortness of breath and
productive cough.

EXAM:
CHEST - 2 VIEW

[chest pa]
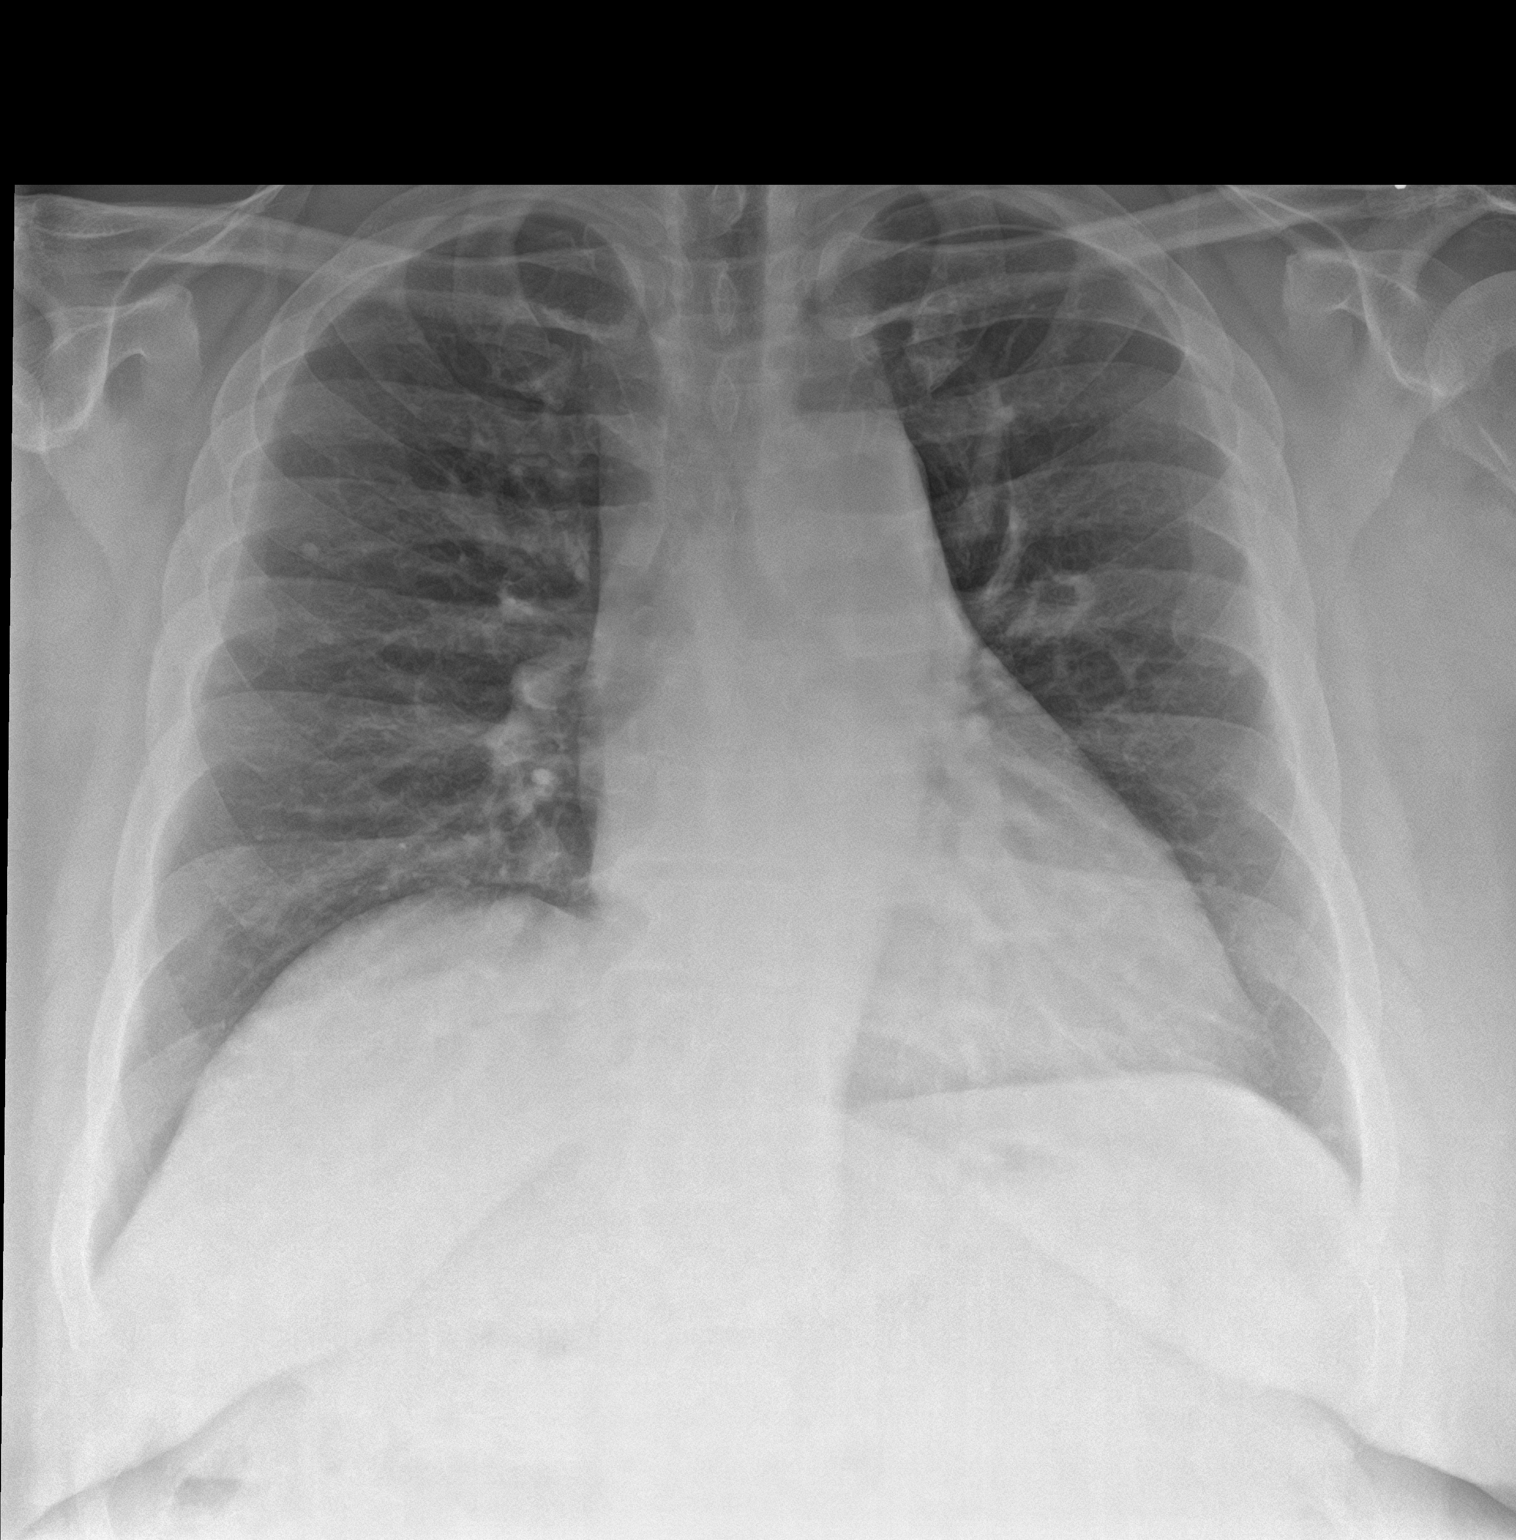

[chest lat]
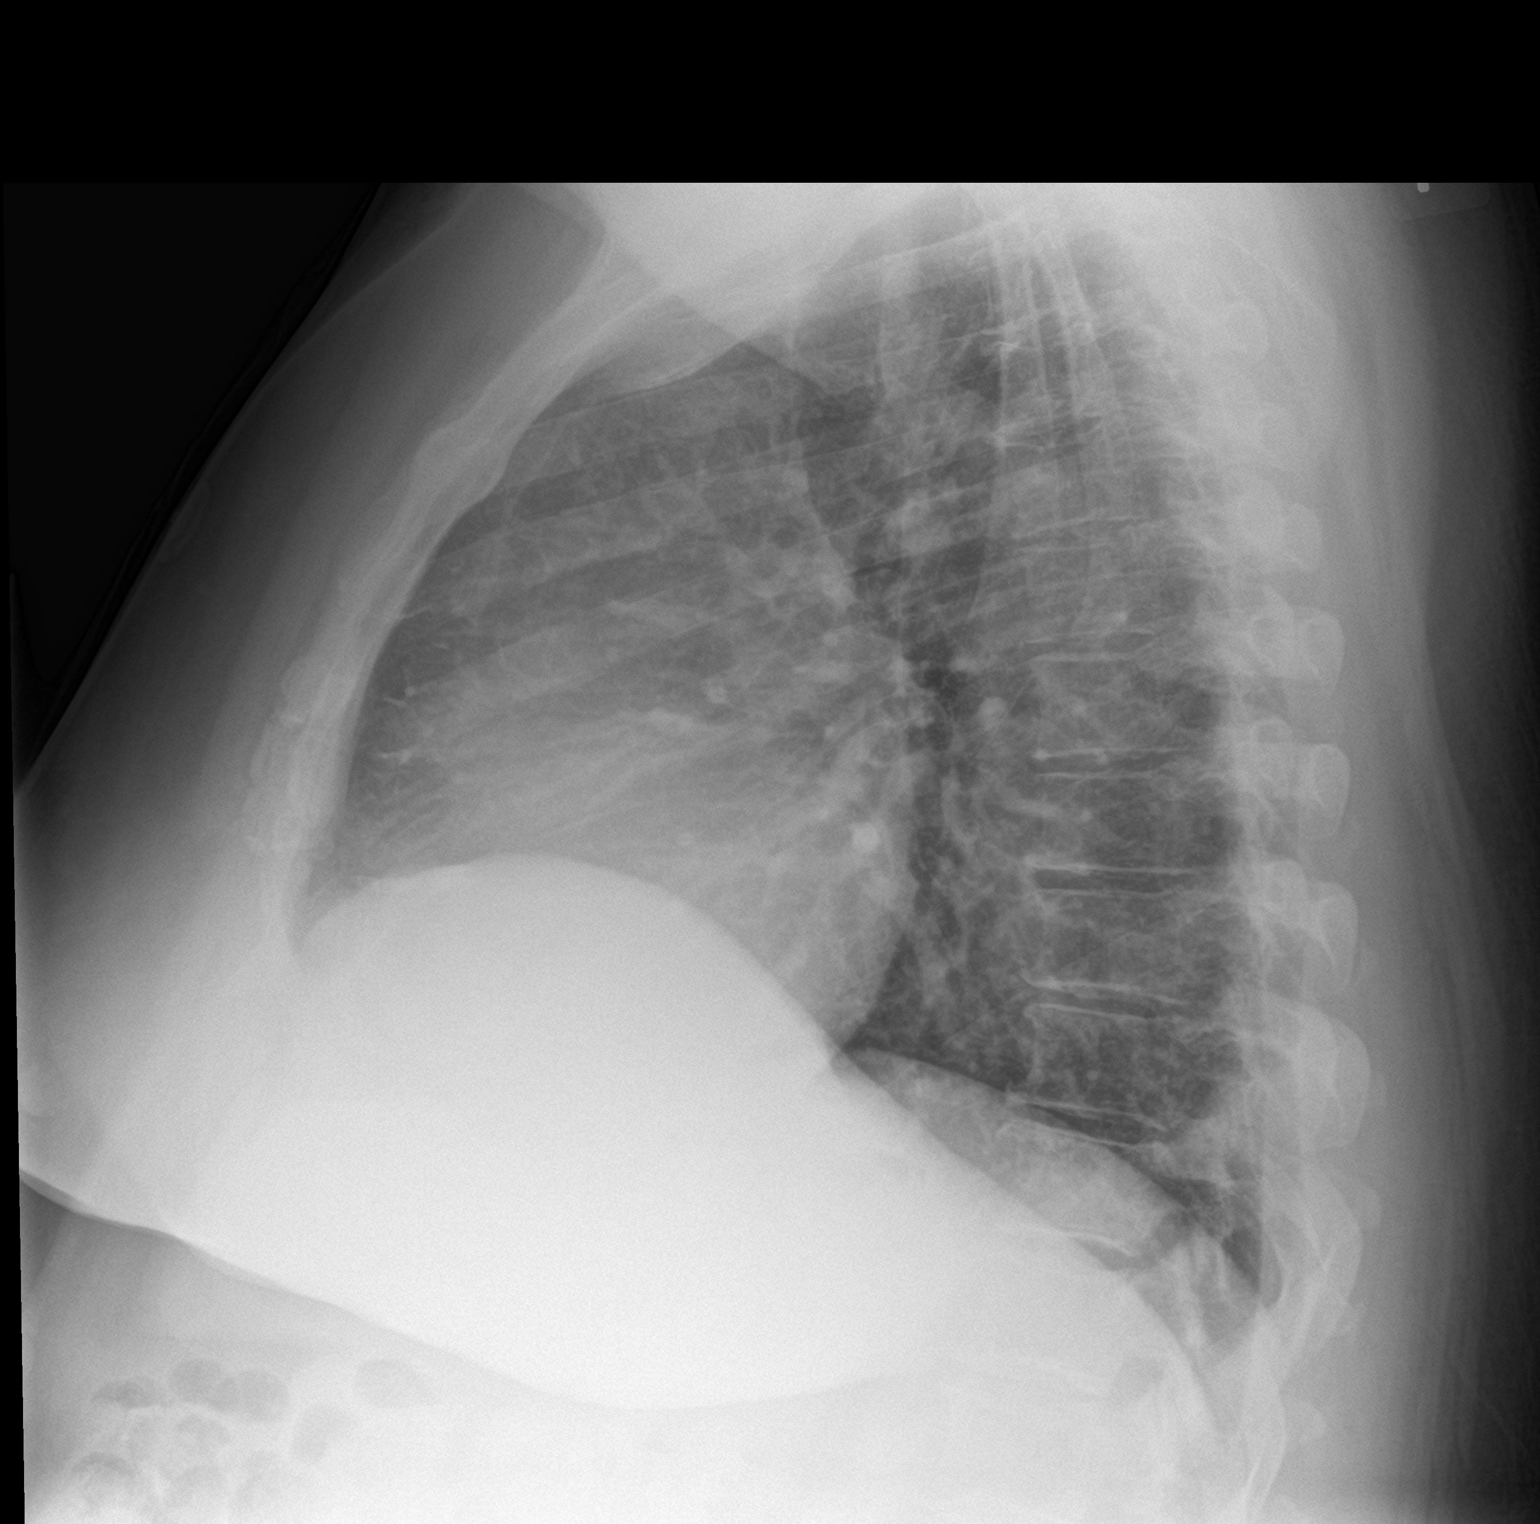

[2 of 2 positions shown; findings below may reference images not displayed]

FINDINGS: PA and lateral views. Lung volumes and mediastinal contours are
stable and within normal limits. Mild chronic elevation of the right
hemidiaphragm compatible with normal variation. Small chronic
calcified granulomas in the lungs which otherwise appear clear. No
pneumothorax or pleural effusion.

No acute osseous abnormality identified. Visualized tracheal air
column is within normal limits. Negative visible bowel gas pattern.
IMPRESSION: No acute cardiopulmonary abnormality.

Small bilateral calcified granulomas.

## 2022-09-21 IMAGING — CT CT CHEST W/O CM
2 of 3 series · 15 of 36 positions shown, 18 images · non-contrast
Comparison: Chest radiograph July 23, 2020. CT chest August 25, 2019

CLINICAL DATA: Cough and shortness of breath. History of liver
transplant.

EXAM:
CT CHEST WITHOUT CONTRAST
TECHNIQUE: Multidetector CT imaging of the chest was performed following the
standard protocol without IV contrast.

[Series 2: thorax · axial · 0.74mm/px · z∈[-400,-126]mm · 12 of 161 slices shown, 15 images]
[im 12/161  mediastinal]
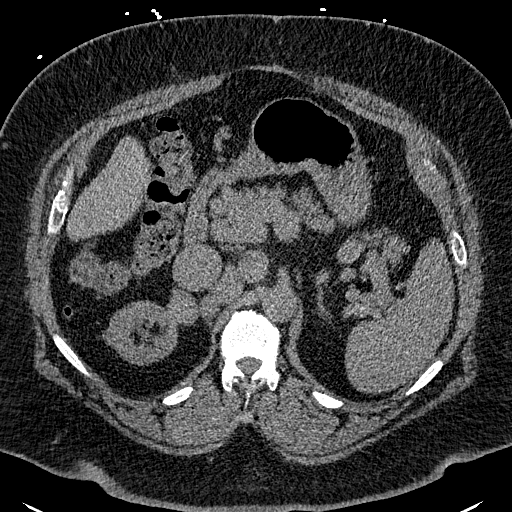
[im 12/161  lung]
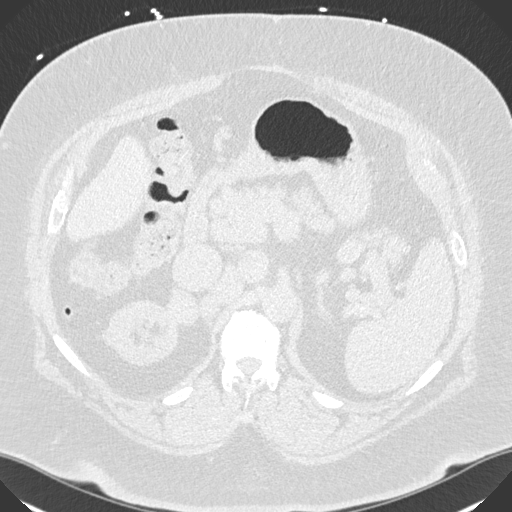
[im 24/161  lung]
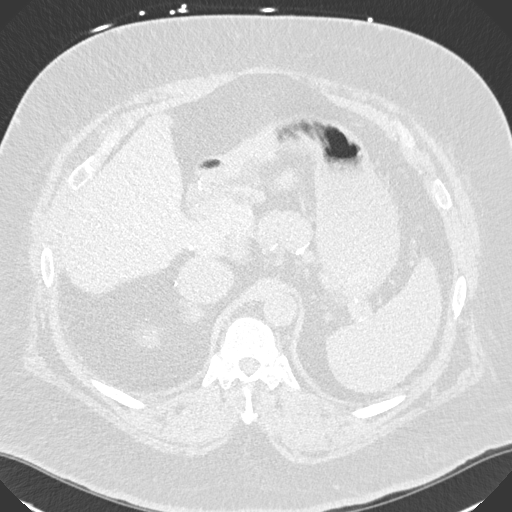
[im 36/161  lung]
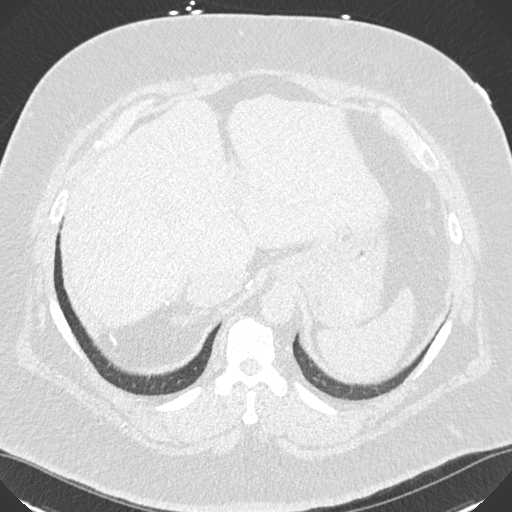
[im 48/161  lung]
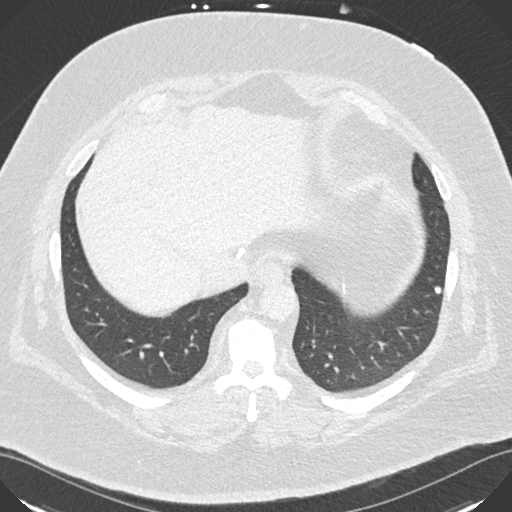
[im 60/161  mediastinal]
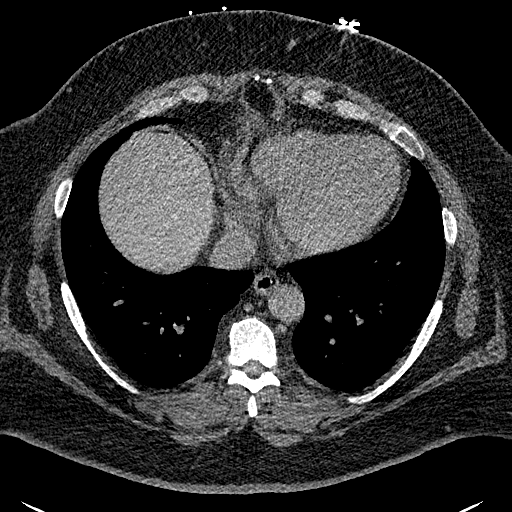
[im 60/161  lung]
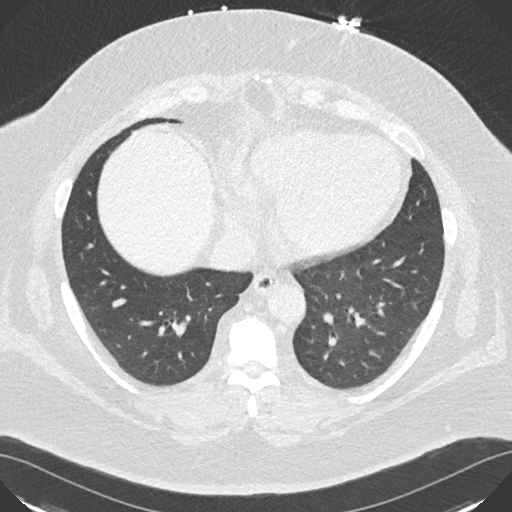
[im 72/161  lung]
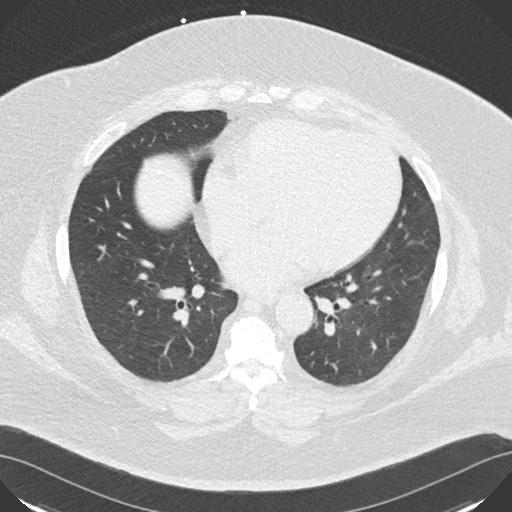
[im 89/161  lung]
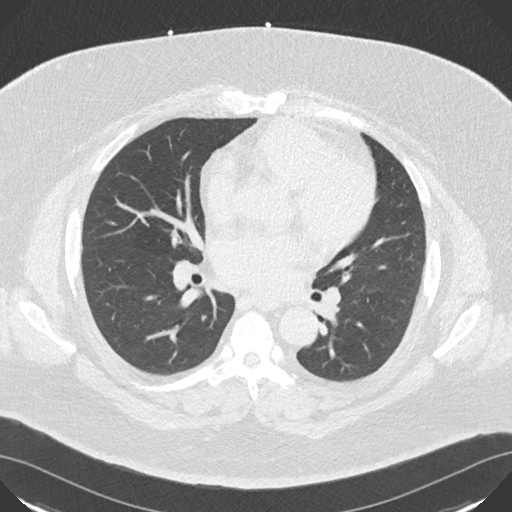
[im 101/161  lung]
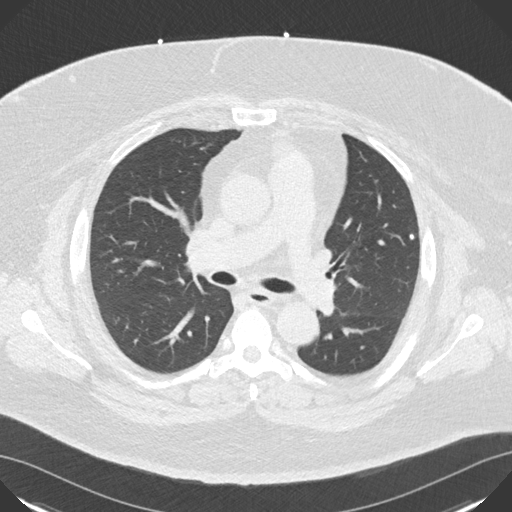
[im 113/161  mediastinal]
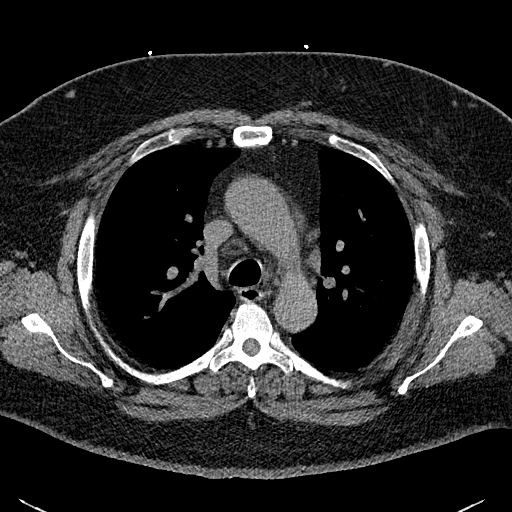
[im 113/161  lung]
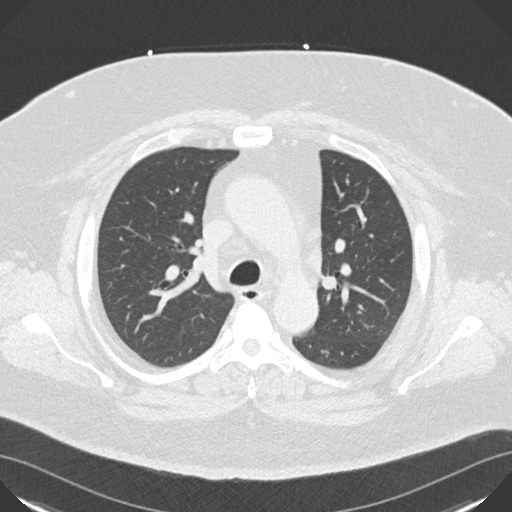
[im 125/161  lung]
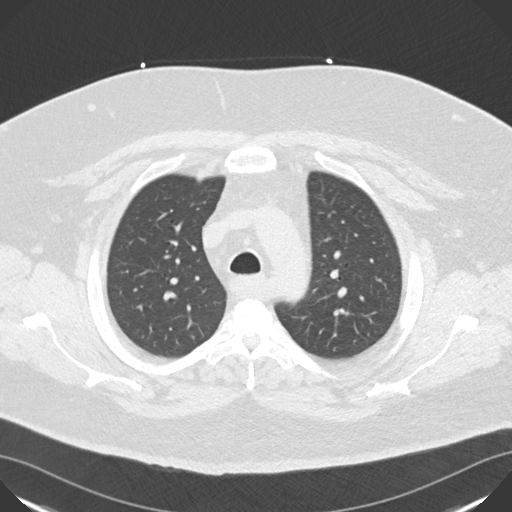
[im 137/161  lung]
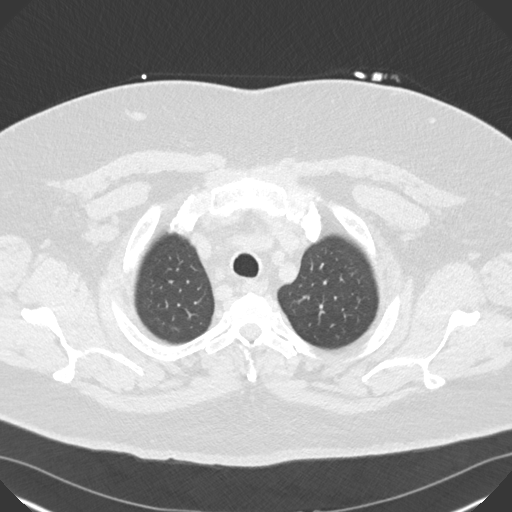
[im 149/161  lung]
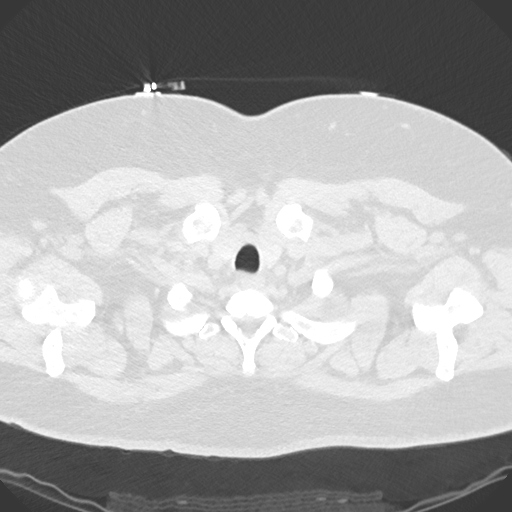

[Series 5: coronal · coronal · 0.70mm/px · 3 of 160 slices shown]
[im 32/160  lung]
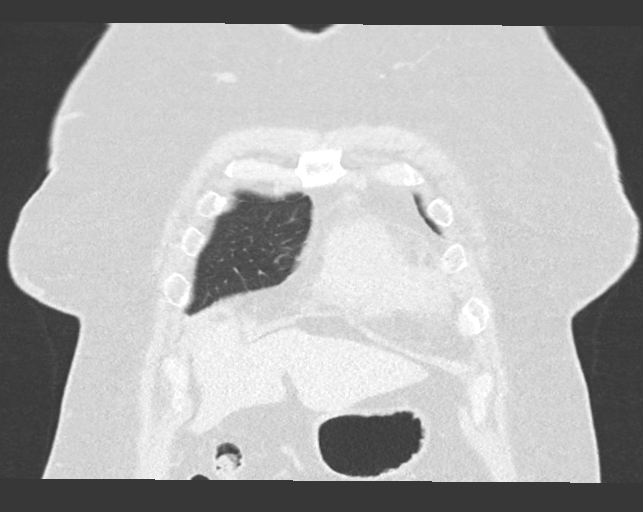
[im 64/160  lung]
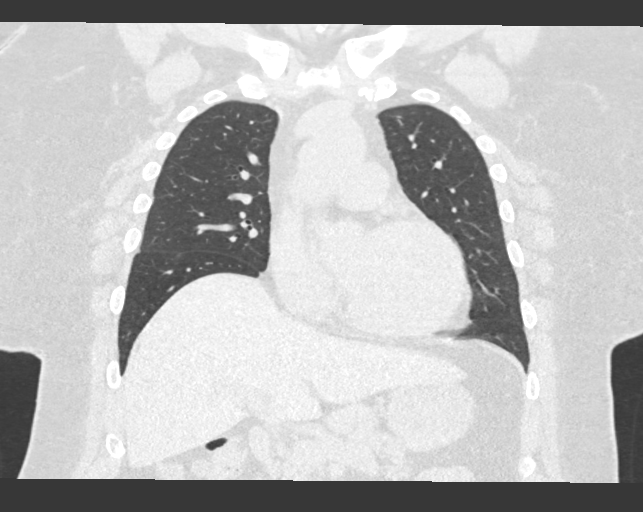
[im 96/160  lung]
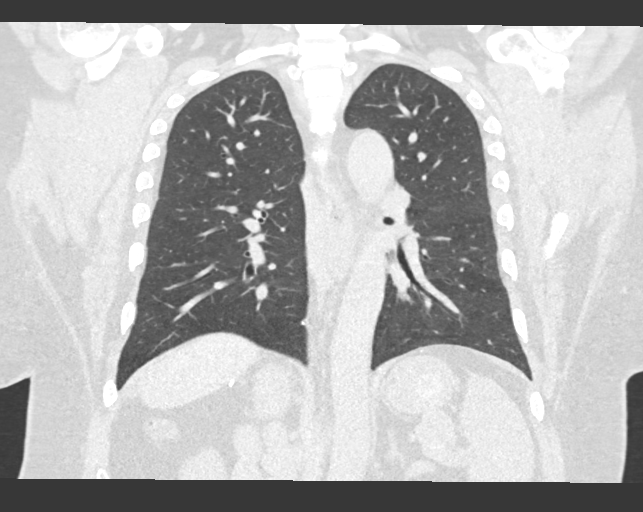

[15 of 36 positions shown; findings below may reference images not displayed]

FINDINGS: Cardiovascular: There is no demonstrable pulmonary embolus.
Visualized great vessels appear unremarkable. Note that there is
aberrant origin of the right subclavian artery which arises
posterior to the other great vessels and crosses posterior to the
esophagus to reach the right side. There is a focus of calcification
in the left anterior descending coronary artery. There is no
pericardial effusion or pericardial thickening.

Mediastinum/Nodes: Thyroid appears normal. No adenopathy evident. No
esophageal lesions are appreciable.

Lungs/Pleura: There are scattered small calcified granulomas. There
is no appreciable edema or airspace opacity. No pleural effusions
are evident. No pneumothorax. Trachea and major bronchial structures
appear patent.

Upper Abdomen: Clips noted in upper abdomen, unchanged. By report,
patient has had liver transplant. Visualized upper abdominal
structures appear unremarkable.

Musculoskeletal: No blastic or lytic bone lesions. No chest wall
lesions.
IMPRESSION: 1. Scattered calcified granulomas. Lungs otherwise clear. No pleural
effusions.

2.  No evident adenopathy.

3.  Foci of coronary artery calcification noted.

## 2022-09-21 MED FILL — DEXCOM G6 TRANSMITTER DEVICE: 90 days supply | Qty: 1 | Fill #0

## 2022-09-21 MED FILL — NOVOLOG FLEXPEN U-100 INSULIN ASPART 100 UNIT/ML (3 ML) SUBCUTANEOUS: SUBCUTANEOUS | 50 days supply | Qty: 60 | Fill #2

## 2022-09-21 MED FILL — OZEMPIC 1 MG/DOSE (4 MG/3 ML) SUBCUTANEOUS PEN INJECTOR: SUBCUTANEOUS | 28 days supply | Qty: 3 | Fill #1

## 2022-09-26 ENCOUNTER — Ambulatory Visit: Admit: 2022-09-26 | Discharge: 2022-09-26 | Payer: PRIVATE HEALTH INSURANCE

## 2022-09-26 ENCOUNTER — Ambulatory Visit
Admit: 2022-09-26 | Discharge: 2022-09-26 | Payer: PRIVATE HEALTH INSURANCE | Attending: Registered" | Primary: Registered"

## 2022-09-26 DIAGNOSIS — N186 End stage renal disease: Principal | ICD-10-CM

## 2022-09-26 NOTE — Unmapped (Signed)
Transplant Nephrology BMI Pre-Transplant Evaluation Consult Note     Referring Physician   Mont Dutton, FNP  [address]  [facility]  [place],  [place] [address]     PCP - Mel Almond, MD, [facility] Juleen Starr     History of Present Illness     Richard [name] is a 57 y.o. male with Chronic Kidney Disease Stage 5 secondary to HTN, T2DM and calcineurin nephrotoxicity. He had a recent creatinine of 4.60 with an eGFR of 14 ml/min on 08/04/2022. This patient has NOT started on hemodialysis or peritoneal dialysis. He is being seen at the request of Dr. Rip Harbour for an initial evaluation BMI Nephrology Pre-Transplant Clinic.     Current Status - Evaluation   Blood Type - O    Saw Dr. Toni Arthurs 04/24/2022 who recommended follow up in BMI clinic.     Prior to liver transplant, did not have any chronic kidney issues but did require dialysis in the perioperative period for presumed hepatorenal syndrome. This improved following transplant and he was able to come off of dialysis, but has dealt with CKD since that time.     Bariatric History  Current BMI - 43.74 kg/m2 (pt with significant central adiposity)  History (exercise, diet, medications) - pt reports that has been doing better in terms of activity. Reports that he has been doing a lot more walking. Denies any functional limitations. Reports that he has cut back on his portion size and that he is trying to eat more fruit and greens. Reports that he has been referred to bariatric surgery previously, but had different insurance at the time and never followed through.  Co-Morbidities (OSA, hypothyroidism) - Reports that he was diagnosed with OSA a long time ago when he was a lot heavier. Denies any hx of hypothyroidism.   Medications (antidepressants, antihistamines, GLP1, insulin) - Reports he has been on Ozempic (1 mg) for a couple weeks, has lost about 20 pounds so far.    Smoking hx - Denies any current use, reports that he smoked for about 6 months 25 years ago  Denies a hx of IBS, N/V, abdominal pain, pancreatitis, medullary thyroid cancer, or multiple endocrine neoplasia     Past Medical History   CKD Stage 5 secondary to HTN, T2DM and calcineurin nephrotoxicity - prior to liver transplant, did not have any chronic kidney issues but did require dialysis in the perioperative period for presumed hepatorenal syndrome. This improved following transplant and he was able to come off of dialysis, but has dealt with CKD since that time.   S/p liver transplant 03/02/2012 - native liver disease NAFLD, basiliximab induction, current immunosuppression cellcept and tacrolimus  HFpEF - Echo 08/2021 EF 55-60%, diastolic LV dysfunction  HTN - amlodipine 5 mg daily, carvedilol 6.25 mg BID  HLD - atorvastatin 40 mg daily   T2DM - A1C 8.3 on 08/04/2022, Ozempic 1 mg daily   Nephrolithiasis   GERD - OTC omeprazole    Past Medical History   Past Medical History:   Diagnosis Date    COVID-19 07/18/2019    Family history of malignant neoplasm of prostate 06/23/2015    Hepatic cirrhosis (CMS-HCC) 06/23/2015    Overview:  Secondary to NASH; followed by Dr. Yevonne Pax, GI.  Last Assessment & Plan:  Relevant Hx: Course: Daily Update: Today's Plan:     Hypertension     Hypogonadism in male 02/12/2014    Liver cirrhosis secondary to NASH (nonalcoholic steatohepatitis) (CMS-HCC)  s/p liver transplant 2013     Screening/Preventative Medicine:  Last Colonoscopy: 2020, repeat in 2025  Last PSA: 0.24 04/24/2022    Immunization History   Administered Date(s) Administered    Hepatitis A Vaccine - Unspecified Formulation 10/06/2011, 03/30/2014    Hepatitis B vaccine, pediatric/adolescent dosage, 10/06/2011, 11/06/2011, 03/30/2014    INFLUENZA QUAD HIGH DOSE 31YRS+(FLUZONE) 04/08/2013    INFLUENZA TIV (TRI) 52MO+ W/ PRESERV (IM) 03/15/2011, 03/30/2014, 04/15/2015, 03/27/2016, 03/27/2017, 04/24/2018    Influenza Vaccine Quad (IIV4 PF) 6-65mo 04/15/2020    Influenza Vaccine Quad(IM)6 MO-Adult(PF) 04/15/2015, 03/27/2016, 03/27/2017, 04/24/2018, 04/28/2022    Influenza Virus Vaccine, unspecified formulation 07/03/2019    PNEUMOCOCCAL POLYSACCHARIDE 23-VALENT 06/22/2016, 03/27/2017    PPD Test 03/07/2012, 05/12/2012    Pneumococcal Conjugate 13-Valent 05/25/2011, 03/27/2016    SHINGRIX-ZOSTER VACCINE (HZV),RECOMBINANT,ADJUVANTED(IM) 03/27/2017, 04/24/2018    TdaP 08/11/2008, 06/11/2016       Social History     Tobacco Use    Smoking status: Former     Passive exposure: Past    Smokeless tobacco: Never   Vaping Use    Vaping status: Never Used   Substance Use Topics    Alcohol use: No    Drug use: No         Past Surgical History   Past Surgical History:   Procedure Laterality Date    liver transpant      LIVER TRANSPLANTATION  2013    PR COLONOSCOPY W/BIOPSY SINGLE/MULTIPLE N/A 08/13/2018    Procedure: COLONOSCOPY, FLEXIBLE, PROXIMAL TO SPLENIC FLEXURE; WITH BIOPSY, SINGLE OR MULTIPLE;  Surgeon: Neysa Hotter, MD;  [facility];  Service: Gastroenterology    PR COLSC FLX W/RMVL OF TUMOR POLYP LESION SNARE TQ N/A 08/13/2018    Procedure: COLONOSCOPY FLEX; W/REMOV TUMOR/LES BY SNARE;  Surgeon: Neysa Hotter, MD;  Location: GI [facility];  Service: Gastroenterology    PR UPPER GI ENDOSCOPY,BIOPSY N/A 07/13/2015    Procedure: UGI ENDOSCOPY; WITH BIOPSY, SINGLE OR MULTIPLE;  Surgeon: Joaquin Music, MD;  [facility] GI [facility];  Service: Gastroenterology         Allergies   No Known Allergies      Medications   Current Outpatient Medications   Medication Sig Dispense Refill    amLODIPine (NORVASC) 5 MG tablet Take 1 tablet (5 mg total) by mouth daily. 90 tablet 3    atorvastatin (LIPITOR) 40 MG tablet Take 1 tablet (40 mg total) by mouth nightly. Med to prevent heart attacks and help cholesterol 90 tablet 3    carvedilol (COREG) 6.25 MG tablet Take 1 tablet (6.25 mg total) by mouth two (2) times a day. 180 tablet 3    CHOLECALCIFEROL, VITAMIN D3, (VITAMIN D3 ORAL) Take 2,000 Units by mouth daily.      furosemide (LASIX) 40 MG tablet Take 1 tablet (40 mg total) by mouth two (2) times a day. Take Furosemide aka Lasix 40 mg twice a day for 3 days, starting at 7 am and then the next dose at 1pm   Then go back to taking Lasix 40 mg once a day (Patient taking differently: Take 1 tablet (40 mg total) by mouth daily. Take Furosemide aka Lasix 40 mg twice a day for 3 days, starting at 7 am and then the next dose at 1pm   Then go back to taking Lasix 40 mg once a day) 60 tablet 6    insulin aspart (NOVOLOG FLEXPEN) 100 unit/mL (3 mL) injection pen Inject 0.4  mL (40 Units total) under the skin Three (3) times a day before meals. 60 mL 3    insulin detemir U-100 (LEVEMIR) 100 unit/mL (3 mL) injection pen Inject 0.2 mL (20 Units total) under the skin nightly. 30 mL 3    magnesium oxide (MAG-OX) 400 mg (241.3 mg elemental magnesium) tablet Take 1 tablet (400 mg total) by mouth daily.      multivitamin (TAB-A-VITE/THERAGRAN) per tablet Take 1 tablet by mouth daily.      mycophenolate (CELLCEPT) 250 mg capsule Take 1 capsule (250 mg total) by mouth two (2) times a day. 60 capsule 11    semaglutide (OZEMPIC) 1 mg/dose (4 mg/3 mL) PnIj injection Inject 1 mg under the skin every seven (7) days. 3 mL 2    spironolactone (ALDACTONE) 25 MG tablet Take 1 tablet (25 mg total) by mouth daily. 30 tablet 3    tacrolimus (PROGRAF) 0.5 MG capsule Take 2 capsules (1 mg total) by mouth two (2) times a day. 360 capsule 3    blood-glucose meter,continuous (DEXCOM G6 RECEIVER) Misc 1 each by Miscellaneous route in the morning. Dispense 1 receiver annually.  Sent to [facility]. 1 each 0    blood-glucose sensor (DEXCOM G6 SENSOR) Devi Use as directed to monitor blood glucose 3 each 0    blood-glucose sensor (DEXCOM G6 SENSOR) Devi Use to check blood glucose and change every 10 days. 9 each 3    blood-glucose transmitter (DEXCOM G6 TRANSMITTER) Devi 1 each by Miscellaneous route Every three (3) months. [facility] 3 each 4    blood-glucose transmitter (DEXCOM G6 TRANSMITTER) Devi Use as directed to monitor blood glucose 1 each 3    glucagon spray (BAQSIMI) 3 mg/actuation Spry Use 1 spray into the left nostril once as needed for up to 1 dose. (Patient not taking: Reported on 05/22/2022) 2 each 0    lancets Misc Use to check blood sugar as directed with insulin 4 times a day & for symptoms of high or low blood sugar. (Patient not taking: Reported on 08/04/2022) 100 each 0    pen needle, diabetic 32 gauge x 5/32 (4 mm) Ndle Use with insulin up to 4 times a day as needed. (Patient not taking: Reported on 08/04/2022) 100 each 0     No current facility-administered medications for this visit.         Family History   Family History   Problem Relation Age of Onset    Diabetes Father     Liver disease Maternal Uncle     Diabetes Paternal Grandfather     Melanoma Neg Hx     Basal cell carcinoma Neg Hx     Squamous cell carcinoma Neg Hx     Kidney disease Neg Hx      Denies family history of kidney disease and denies family history of kidney cancer.      Review of Systems     Reports no chest pain, no shortness of breath, no nausea, no vomiting, no abdominal pain, no dyspnea with exertion, no light-headedness, no loss of consciousness, no difficulty swallowing, no fevers, no chills, no diarrhea, no blood in stool, no pain urinating, no blood in urine, no sensation of incomplete bladder emptying.     Otherwise as per HPI. All other systems are reviewed and are negative.      Physical Exam     BP 147/97 (BP Site: L Arm, BP Position: Sitting, BP Cuff Size: Medium)  - Pulse 65  -  Temp 37 ??C (98.6 ??F) (Tympanic)  - Ht 170.4 cm (5' 7.1)  - Wt (!) 127.1 kg (280 lb 1.6 oz)  - BMI 43.74 kg/m??   Constitutional:  Well-appearing in NAD  Eyes:  anicteric sclerae  ENT:  MMM  CV:  RRR, no m/r/g, no JVD, extremities WWP with no edema  Resp:  Good air movement, CTAB  GI:  Abdomen soft, NTND, +bs  MSK:  Grossly normal, exam is limited  Skin:  Normal turgor, no rash  Neuro:  Grossly normal, exam is limited  Psych:  Normal affect  Dialysis access: None      Laboratory Results and Imaging Data Reviewed in EPIC      We reviewed renal transplantation concepts with the patient in detail, including:   Kidney donor types including living donors, deceased donors, and HCV positive deceased donors  Increase risk of diabetes post transplant.   Increase risk of infections post surgery.   Increase risk of cancers.   Increase risk of heart attack especially during the first 3 months after surgery.   The possibility of needing dialysis post transplant.   Need for a kidney biopsy post transplant and treatment with medications as required.   Importance of being compliant with the medications and follow up appointment with physicians and other members of the transplant team.   Side effects of immunosuppression.   Recurrent disease.     Assessment:    57 y.o. male who appears to be an acceptable medical candidate for kidney transplantation pending the results of additional evaluation, testing and weight loss.     Recommend pt be seen in Multidisciplinary BMI Clinic since interested in bariatric surgery and has significant central adiposity.     Recommendations/Plan:  We discussed the rationale for weight loss prior to transplant. Patient is aware of the impact of BMI >40 kg/m2 on outcomes including delayed graft function, primary non-function, slow wound healing, and wound infection.    We discussed options for weight loss including dietary modification under the guidance of a dietitian combined with exercise, medications that might assist with weight loss and gastric surgery.   Patient will continue to pursue diet and exercise at this time.   Evaluation active status to pursue inactive listing due to pre-dialysis status.   Perioperative cardiovascular risk assessment including echocardiogram, ECG, and stress testing.  CT Abd/Pelvis without contrast to assess iliac circulation for vascular disease burden and targets for kidney implantation  Continue Ozempic per PCP  Referral to Multidisciplinary BMI Clinic  Return to BMI Clinic in 6 months     I personally spent 60 minutes face-to-face and non-face-to-face in the care of this patient, which includes all pre, intra, and post visit time on the date of service.  All documented time was specific to the E/M visit and does not include any procedures that may have been performed.    This patient was seen in conjunction with Dr. Margaretmary Bayley who was in agreement with the stated plan above.    Mont Dutton, FNP-BC  [facility]  09/26/2022  10:00 AM

## 2022-09-26 NOTE — Unmapped (Signed)
Please see nutrition encounter

## 2022-09-26 NOTE — Unmapped (Signed)
[facility] Outpatient Nutrition Services   Medical Nutrition Therapy Consultation  High BMI Clinic     Visit Type:    Initial Assessment    Referral Reason: :  Kidney transplant evaluation      Richard [name] is a 57 y.o. male seen for medical nutrition therapy for pre renal transplant. His active problem list, medication list, allergies, family history, social history, notes from last several encounters, and lab results were reviewed.     His interim medical history is significant for chronic kidney disease stage IV, hypertension, type 2 diabetes, HFpEF, GERD, liver transplant (2013 at [facility]) and obesity.    Anthropometrics on September 26, 2022  Height: 170.4 cm (5' 7.09)   Weight: (!) 127 kg (280 lb)  Body mass index is 43.74 kg/m??.    Usual body weight: 280 pounds for the past month    Ideal Body Weight: 67.48 kg  Adjusted Body Weight: 90.6 kg       Wt Readings from Last 5 Encounters:   09/26/22 (!) 127 kg (280 lb)   09/26/22 (!) 127.1 kg (280 lb 1.6 oz)   08/04/22 (!) 131.5 kg (289 lb 12.8 oz)   08/01/22 (!) 132.5 kg (292 lb)   07/26/22 (!) 127 kg (280 lb)        Nutrition Risk Screening:     Nutrition Focused Physical Exam:     Patient does not meet AND/ASPEN criteria for malnutrition at this time (09/26/22 0904)    Frailty Index Score:   None provided     Malnutrition Screening:   Patient does not meet AND/ASPEN criteria for malnutrition at this time (09/26/22 0904)    Biochemical Data, Medical Tests and Procedures:  {ONS Results Reviewed Nutrition Related:77903}    Lab Results   Component Value Date    BUN 41 (H) 08/04/2022    CREATININE 4.60 (H) 08/04/2022    GFRAA >=60 03/27/2017    GFRNONAA 27 (L) 07/15/2021    NA 141 08/04/2022    K 4.5 08/04/2022    CL 109 (H) 08/04/2022    CO2 23.5 08/04/2022    CALCIUM 9.8 08/04/2022    PHOS 4.2 08/04/2022    GLUCOSE 196 03/03/2012    ALBUMIN 4.1 08/04/2022    PTH 267.5 (H) 10/14/2021       No results found for: [facility]    Lab Results   Component Value Date    MG 1.9 08/04/2022    MG 1.8 06/30/2022    MG 1.9 05/22/2022    PHOS 4.2 08/04/2022    PHOS 4.6 06/30/2022    PHOS 4.7 05/22/2022       Lab Results   Component Value Date    CHOL 211 (H) 04/24/2022    HDL 33 (L) 04/24/2022    LDL 104 (H) 04/24/2022    TRIG 369 (H) 04/24/2022       Lab Results   Component Value Date    A1C 8.3 (H) 08/04/2022    A1C 10.5 (H) 04/24/2022    A1C 8.3 (H) 08/28/2021       Lab Results   Component Value Date    PROTEINUA 100 mg/dL (A) 16/03/9603    GLUCOSEU >1000 mg/dL (A) 54/02/8118       Lab Results   Component Value Date    HGB 12.9 08/04/2022    HCT 38.4 (L) 08/04/2022       Lab Results   Component Value Date    IRON 72 05/15/2012  TIBC 184 (L) 05/15/2012    FERRITIN 432 (H) 05/15/2012     Fasting blood sugar 155 mg/dL    Has CGM  Unaware of his  TIR    Medications and Vitamin/Mineral Supplementation:   All nutritionally pertinent medications reviewed on 09/26/2022.   Nutritionally pertinent medications include: atorvastatin, insulin aspart , insulin detemir U-100 , semaglutide, tacrolimus  He is taking nutrition supplements. CHOLECALCIFEROL, magnesium oxide  Taking omeprazole (not listed)   Aware of food drug interaction        Current Outpatient Medications   Medication Sig Dispense Refill    amLODIPine (NORVASC) 5 MG tablet Take 1 tablet (5 mg total) by mouth daily. 90 tablet 3    atorvastatin (LIPITOR) 40 MG tablet Take 1 tablet (40 mg total) by mouth nightly. Med to prevent heart attacks and help cholesterol 90 tablet 3    blood sugar diagnostic (CONTOUR NEXT TEST STRIPS) Strp Use to check blood sugar as directed with insulin 4 times a day & for symptoms of high or low blood sugar. (Patient not taking: Reported on 08/04/2022) 100 each 0    blood-glucose meter Misc USE AS DIRECTED (Patient not taking: Reported on 08/04/2022) 1 each 0    blood-glucose meter,continuous (DEXCOM G6 RECEIVER) Misc 1 each by Miscellaneous route in the morning. Dispense 1 receiver annually.  Sent to [facility]. 1 each 0    blood-glucose sensor (DEXCOM G6 SENSOR) Devi Use as directed to monitor blood glucose 3 each 0    blood-glucose sensor (DEXCOM G6 SENSOR) Devi Use to check blood glucose and change every 10 days. 9 each 3    blood-glucose transmitter (DEXCOM G6 TRANSMITTER) Devi 1 each by Miscellaneous route Every three (3) months. [facility] pharmacy 3 each 4    blood-glucose transmitter (DEXCOM G6 TRANSMITTER) Devi Use as directed to monitor blood glucose 1 each 3    carvedilol (COREG) 6.25 MG tablet Take 1 tablet (6.25 mg total) by mouth two (2) times a day. 180 tablet 3    CHOLECALCIFEROL, VITAMIN D3, (VITAMIN D3 ORAL) Take 2,000 Units by mouth daily.      furosemide (LASIX) 20 MG tablet Take 2 tablets (40 mg total) by mouth two (2) times a day. (Patient not taking: Reported on 09/26/2022) 120 tablet 2    furosemide (LASIX) 40 MG tablet Take 1 tablet (40 mg total) by mouth two (2) times a day. Take Furosemide aka Lasix 40 mg twice a day for 3 days, starting at 7 am and then the next dose at 1pm   Then go back to taking Lasix 40 mg once a day (Patient taking differently: Take 1 tablet (40 mg total) by mouth daily. Take Furosemide aka Lasix 40 mg twice a day for 3 days, starting at 7 am and then the next dose at 1pm   Then go back to taking Lasix 40 mg once a day) 60 tablet 6    glucagon spray (BAQSIMI) 3 mg/actuation Spry Use 1 spray into the left nostril once as needed for up to 1 dose. (Patient not taking: Reported on 05/22/2022) 2 each 0    insulin aspart (NOVOLOG FLEXPEN) 100 unit/mL (3 mL) injection pen Inject 0.4 mL (40 Units total) under the skin Three (3) times a day before meals. 60 mL 3    insulin detemir U-100 (LEVEMIR) 100 unit/mL (3 mL) injection pen Inject 0.2 mL (20 Units total) under the skin nightly. 30 mL 3    lancets Misc Use to check blood sugar as  directed with insulin 4 times a day & for symptoms of high or low blood sugar. (Patient not taking: Reported on 08/04/2022) 100 each 0    magnesium oxide (MAG-OX) 400 mg (241.3 mg elemental magnesium) tablet Take 1 tablet (400 mg total) by mouth daily.      multivitamin (TAB-A-VITE/THERAGRAN) per tablet Take 1 tablet by mouth daily.      mycophenolate (CELLCEPT) 250 mg capsule Take 1 capsule (250 mg total) by mouth two (2) times a day. 60 capsule 11    pen needle, diabetic 32 gauge x 5/32 (4 mm) Ndle Use with insulin up to 4 times a day as needed. (Patient not taking: Reported on 08/04/2022) 100 each 0    semaglutide (OZEMPIC) 1 mg/dose (4 mg/3 mL) PnIj injection Inject 1 mg under the skin every seven (7) days. 3 mL 2    spironolactone (ALDACTONE) 25 MG tablet Take 1 tablet (25 mg total) by mouth daily. 30 tablet 3    tacrolimus (PROGRAF) 0.5 MG capsule Take 2 capsules (1 mg total) by mouth two (2) times a day. 360 capsule 3     No current facility-administered medications for this visit.       Nutrition History:     Dietary Restrictions: He avoids intake of  grapefruit, pomegranate, star fruit and limit clementine x 1 daily   due to food drug interaction.     Gastrointestinal Issues: Heartburn treats with omperazole  Den  Hunger and Satiety:  Reports no appetite    Food Safety and Access: He did not report issues.     Typical Diet Recall:   Time Intake   Breakfast Skips or Smoothie (kale spinach ice water mango)    Snack (AM) skips   Lunch Salad (lettuce tomato cucumber grilled chicken breast) at [employer] or [employer] or [employer] and go chicken breast with salad bar   Snack (PM) skips   Dinner Skips or [place] or pasta    Snack (HS) Skip      Food-Related History:tries fasting at times; does not cook   Snacks:  skips  Beverages:  water or diet sprite  Dining Out:  15/21  Cooking Methods: no cooking  Usual Food Choices: listed above   Meal Schedule:  no     Eating Behaviors:  Overeating: Denied issues with overeating.   Emotional Eating: He noted eating to deal with feelings other than hunger including boredom.      Physical Activity:  Physical activity level is moderate with 3-5 days of exercise. ;walking about 30 minutes daily    Daily Estimated Nutrient Needs:  Energy:   kcals [  23-25 kcal using  66.3 kg ,ideal body weight  ]  Protein:   gm [  using  ,  ]  Carbohydrate:   grams based on 45% total calories  Fluid:   per MD  Sodium:  <2300 mg    Nutrition Goals & Evaluation      ***  ({MNT Goal Progress:77446})  ***  ({MNT Goal Progress:77446})  ***  ({MNT Goal Progress:77446})  ***  ({MNT Goal Progress:77446})  ***  ({MNT Goal Progress:77446})    Nutrition goals reviewed, and relevant barriers identified and addressed: {MNT Barriers to Care:77905}. He is evaluated to have {DESC; GOOD/FAIR/POOR:18685} willingness and ability to achieve nutrition goals.     Nutrition Assessment       Current diet is not adequate or appropriate at this time for pre renal medical nutrition therapy for transplant based  on their typical dietary intake. Patient currently skips meals, consumes highly processed foods/high sodium intake, with an inadequate intake of lean protein, non-starchy low potassium vegetables/fruit, whole grains, low fat dairy foods with limited physical activity. Patient would benefit from nutritional and lifestyle changes to assist with nutrition goals.   Patient is obese (class ***). Patient has been weight *** for the past *** months. Patient is aware of the need for weight loss and reducing waist circumference for renal transplant and is willing to work towards weight loss goals.   Blood sugar are *** at this time. Patient may benefit from glucose monitoring to determine if medication, nutrition and/or lifestyle changes are needed to manage blood sugar for glycemic goal.   Patient assessed to have nutrition knowledge deficit for *** medical nutrition therapy and would benefit from on-going nutrition education and counseling.      Transplant Nutrition Assessment: The patient has the following nutrition concerns for transplant listing: BMI of *** and HgbA1c ***  These are not an absolute contraindication for transplant listing but will be discussed with the team during the selection conference.           Nutrition Intervention      {MNT Intervention:72370}    Nutrition Plan:   ***    Follow up will occur in ***.     {MNT Monitoring at Follow-up:76793} will be assessed at time of follow-up.         Recommendations for {ONS Recommendations:77904}:  ***       Time spent *** minutes      Greta Doom, MS RD - Clinical Dietitian II  Outpatient Nutrition Services   [facility]   [address] [place], [place] [address]  p [phone]  [email or web address] [phone]  [email or web address]

## 2022-09-26 NOTE — Unmapped (Signed)
Eat 3 meals daily with lean protein and adequate carbohydrates  Exercise at least 150 minutes weekly; aim to increase your exercise  Drink your fluids according to your  provider's recommendation  Check your blood sugars as directed by your provider; your goal is for your glycemic control to have a HgbA1c <7%

## 2022-09-28 NOTE — Unmapped (Signed)
Weight Management Services Financial Information Form    Hospital NPI: [id number] / Physicians NPI: [id number]   Ferd Glassing NPI: [id number]   [place] NPI: [id number]  Yetta Flock NPI: [id number]    MRN: [id number]  Patient: Richard [name]    Insurance Company: [employer]  Telephone: [phone]  Policy/Subscriber ID:     Bonnita Levan Information (if different from patient)  Name: n/a  Date of Birth:  n/a     Date Called: 09/28/22  Spoke with:   Reference # (if given):     Is insurance in-network: Yes.    Is Bariatric Surgery Covered: Yes  Covered Surgeries:   43644: Laparoscopic roux-en-y gastric bypass   43775: Laparoscopic gastric sleeve     **Completion of this form does not guarantee coverage and is subject to change. The patient is instructed at the time of scheduling that they need to call their insurance company directly to confirm bariatric surgery coverage benefits. If the patient has questions about coinsurance, deductibles or out-of-pocket expenses they should call their insurance carrier or the financial counselor for upfront surgery cost questions at [phone]**    What requirements must be met for surgery to be approved:  [phone] auths    Physician Supervised Diet? No How Long? n/a  Does it have to be consecutive? No  Does it have to be done by PCP or can it be by a dietitian?  n/a  How recent does it have to be? (past , , etc.) n/a  Do I need a weight history? (79yr, 64yrs, 58yrs) n/a  Do I need a clearance/referral letter from a doctor? n/a   If yes, does it have to come from PCP? n/a  Are nutrition consultations a covered benefit? Yes  (List next to visit type max number of visits, if applicable)  16010: Initial 1:1 nutrition counseling   [phone]: Follow up 1:1 nutrition counseling  57322: Group MNT nutrition counseling

## 2022-10-02 NOTE — Unmapped (Signed)
Called pt and scheduled annual for 7/22, provider's first available. Pt said he would like to get labs at [facility] before appt, denied need for appt letter, and verbalized understanding of all discussed.    Made lab appt for 7/22 before clinic appt.

## 2022-10-05 MED ORDER — ASPIRIN 81 MG TABLET,DELAYED RELEASE
ORAL_TABLET | Freq: Every day | ORAL | 2 refills | 150 days
Start: 2022-10-05 — End: 2023-10-05

## 2022-10-05 NOTE — Unmapped (Unsigned)
Internal Medicine Clinic Visit    Reason for visit: ***    A/P:    {TIP - HCC- RA[facility] Pilot- Clinical Documentation Specialist Recommendations-  Always schedule him for 1 hour visits w/ PCP, complicated medical history    This text will self delete upon signing note:[id number]}     1. Type 2 diabetes mellitus with hyperglycemia, with long-term current use of insulin (CMS-HCC)    2. Primary hypertension        Currently on 1 mg semaglutide weekly and has been since 07/2022.   Inc semaglutide to either 1.7 or 2.0 mg weekly   Hold off on metformin as eGFR <30  Hold off on SGLT2 inhibitor as eGFR <20    2. Currently on amlodipine 5, lasix 40 bid,   - susp OSA is playing a role in refractory HTN given other assoc comorbid conditions (Mallampati 3 or 4, obesity, T2DM, kidney failure)    3. HCM- given exercise goal of 150 min weekly by kidney transplant nutrition team as well as goal of eating lean protein/carbs/less processed foods 3x daily.   - Reviewed HCM tab and only due for Covid vaccine     __________________________________________________________    HPI:    Since last visit:   - increased Lasix 40 qD to BID w/ ongoing HTN, elev BNP  - requested hour long f/u appointment given complicated comorbidities need for intensive counseling each visit; unable to address DM2  - reviewed HF hx, suggestion from Cards inpt for outpt f/u and possible LHC  __________________________________________________________        Medications:  Reviewed in EPIC  __________________________________________________________    Physical Exam:   Vital Signs:  There were no vitals filed for this visit.       PTHomeBP    The patient???s Average Home Blood Pressure during the last two weeks is :   /   based on  readings            Gen: Well appearing, NAD  CV: RRR, no murmurs  Pulm: CTA bilaterally, no crackles or wheezes  Abd: Soft, NTND, normal BS.   Ext: No edema  ***    PHQ-9 Score:     GAD-7 Score:       Medication adherence and barriers to the treatment plan have been addressed. Opportunities to optimize healthy behaviors have been discussed. Patient / caregiver voiced understanding.

## 2022-10-10 NOTE — Unmapped (Signed)
Prior authorization request for Ozempic (0.25 or 0.5 mg/dose) pen-injectors sent through Covermymeds (KEY: [id number]). Awaiting response.

## 2022-10-12 DIAGNOSIS — I5032 Chronic diastolic (congestive) heart failure: Principal | ICD-10-CM

## 2022-10-12 MED ORDER — FUROSEMIDE 20 MG TABLET
ORAL_TABLET | Freq: Two times a day (BID) | ORAL | 2 refills | 30.00000 days | Status: CN
Start: 2022-10-12 — End: 2023-10-12

## 2022-10-13 DIAGNOSIS — I10 Essential (primary) hypertension: Principal | ICD-10-CM

## 2022-10-13 DIAGNOSIS — I5032 Chronic diastolic (congestive) heart failure: Principal | ICD-10-CM

## 2022-10-13 MED ORDER — CARVEDILOL 6.25 MG TABLET
ORAL_TABLET | Freq: Two times a day (BID) | ORAL | 3 refills | 90 days | Status: CP
Start: 2022-10-13 — End: 2023-10-13
  Filled 2022-10-25: qty 180, 90d supply, fill #0

## 2022-10-13 MED ORDER — FUROSEMIDE 40 MG TABLET
ORAL_TABLET | Freq: Two times a day (BID) | ORAL | 4 refills | 90 days | Status: CP
Start: 2022-10-13 — End: 2024-01-06
  Filled 2022-10-25: qty 180, 90d supply, fill #0

## 2022-10-13 NOTE — Unmapped (Signed)
Complex Case Management  SUMMARY NOTE    High Risk Care Coordinator  spoke with patient and verified correct patient using two identifiers today to introduce the Complex Case Management program.     Discussed the following:  Program Services and Verified Demographics    Program status: Declined  Patient states that they do not need the services of the Complex Case Management program at this time. Care Coordinator has voiced understanding and will send our contact information to patient via their [facility] My Chart shall they need to utilize our services in the future.       Deliah Goody - High Risk Care Coordinator   [facility]  [address], [address]  [place], [place] [address]  P: [phone] F: [phone]  [email or web address]

## 2022-10-23 DIAGNOSIS — I10 Essential (primary) hypertension: Principal | ICD-10-CM

## 2022-10-23 MED ORDER — AMLODIPINE 5 MG TABLET
ORAL_TABLET | Freq: Every day | ORAL | 3 refills | 90 days | Status: CP
Start: 2022-10-23 — End: 2023-08-05
  Filled 2022-10-25: qty 90, 90d supply, fill #0

## 2022-10-25 MED FILL — OZEMPIC 1 MG/DOSE (4 MG/3 ML) SUBCUTANEOUS PEN INJECTOR: SUBCUTANEOUS | 28 days supply | Qty: 3 | Fill #2

## 2022-10-25 MED FILL — SPIRONOLACTONE 25 MG TABLET: ORAL | 30 days supply | Qty: 30 | Fill #0

## 2022-11-09 ENCOUNTER — Ambulatory Visit: Admit: 2022-11-09 | Discharge: 2022-11-10 | Payer: PRIVATE HEALTH INSURANCE

## 2022-11-09 ENCOUNTER — Ambulatory Visit: Admit: 2022-11-09 | Discharge: 2022-11-10 | Payer: PRIVATE HEALTH INSURANCE | Attending: Family | Primary: Family

## 2022-11-09 LAB — COMPREHENSIVE METABOLIC PANEL
ALBUMIN: 3.2 g/dL — ABNORMAL LOW (ref 3.4–5.0)
ALKALINE PHOSPHATASE: 152 U/L — ABNORMAL HIGH (ref 46–116)
ALT (SGPT): 26 U/L (ref 10–49)
ANION GAP: 14 mmol/L (ref 5–14)
AST (SGOT): 21 U/L (ref <=34)
BILIRUBIN TOTAL: 0.8 mg/dL (ref 0.3–1.2)
BLOOD UREA NITROGEN: 74 mg/dL — ABNORMAL HIGH (ref 9–23)
BUN / CREAT RATIO: 15
CALCIUM: 8.9 mg/dL (ref 8.7–10.4)
CHLORIDE: 97 mmol/L — ABNORMAL LOW (ref 98–107)
CO2: 24.1 mmol/L (ref 20.0–31.0)
CREATININE: 4.79 mg/dL — ABNORMAL HIGH
EGFR CKD-EPI (2021) MALE: 14 mL/min/{1.73_m2} — ABNORMAL LOW (ref >=60)
GLUCOSE RANDOM: 383 mg/dL — ABNORMAL HIGH (ref 70–179)
POTASSIUM: 3.1 mmol/L — ABNORMAL LOW (ref 3.5–5.1)
PROTEIN TOTAL: 7.2 g/dL (ref 5.7–8.2)
SODIUM: 135 mmol/L (ref 135–145)

## 2022-11-09 LAB — TOXICOLOGY SCREEN, URINE
AMPHETAMINE SCREEN URINE: NEGATIVE
BARBITURATE SCREEN URINE: NEGATIVE
BENZODIAZEPINE SCREEN, URINE: NEGATIVE
BUPRENORPHINE, URINE SCREEN: NEGATIVE
CANNABINOID SCREEN URINE: NEGATIVE
COCAINE(METAB.)SCREEN, URINE: NEGATIVE
FENTANYL SCREEN, URINE: NEGATIVE
METHADONE SCREEN, URINE: NEGATIVE
OPIATE SCREEN URINE: NEGATIVE
OXYCODONE SCREEN URINE: NEGATIVE

## 2022-11-09 LAB — CBC
HEMATOCRIT: 37.4 % — ABNORMAL LOW (ref 39.0–48.0)
HEMOGLOBIN: 12.9 g/dL (ref 12.9–16.5)
MEAN CORPUSCULAR HEMOGLOBIN CONC: 34.4 g/dL (ref 32.0–36.0)
MEAN CORPUSCULAR HEMOGLOBIN: 27.8 pg (ref 25.9–32.4)
MEAN CORPUSCULAR VOLUME: 80.8 fL (ref 77.6–95.7)
MEAN PLATELET VOLUME: 7.5 fL (ref 6.8–10.7)
PLATELET COUNT: 267 10*9/L (ref 150–450)
RED BLOOD CELL COUNT: 4.63 10*12/L (ref 4.26–5.60)
RED CELL DISTRIBUTION WIDTH: 14.7 % (ref 12.2–15.2)
WBC ADJUSTED: 8.9 10*9/L (ref 3.6–11.2)

## 2022-11-09 LAB — IRON PANEL
IRON SATURATION (CALC): 36 %
IRON: 95 ug/dL
TOTAL IRON BINDING CAPACITY (CALC): 267.1 ug/dL (ref 250.0–425.0)
TRANSFERRIN: 212 mg/dL — ABNORMAL LOW

## 2022-11-09 LAB — TSH: THYROID STIMULATING HORMONE: 2.539 u[IU]/mL (ref 0.550–4.780)

## 2022-11-09 LAB — VITAMIN B12: VITAMIN B-12: 721 pg/mL (ref 211–911)

## 2022-11-09 LAB — FERRITIN: FERRITIN: 169.8 ng/mL

## 2022-11-09 LAB — FOLATE: FOLATE: 20.8 ng/mL (ref >=5.4)

## 2022-11-09 NOTE — Unmapped (Addendum)
GOALS:    1.  Meal schedule: remember to eat protein every 3-4 hours    - include protein at every meal and every snack   8:00am: breakfast - include protein    12:00pm: lunch - protein + non-starchy vegetables   4:00pm: protein based snack   6:00pm: dinner - protein + non-starchy vegetables              9:00pm: protein based snack    PROTEIN OPTIONS: chicken/beef/pork, fish/tuna, eggs, cheese, cottage cheese, [place] yogurt, deli meat, protein shakes, beans, nuts/seeds, peanut butter   * Nuts and peanut butter are restricted to one serving once per day.    1/4 cup nuts OR 2 Tbsp peanut butter    2.  Eat protein foods first. Vegetables second. Carbohydrates/fruits last.     3.  Practice not drinking at meals and waiting for 30 minutes after meals to start drinking again    4.  Practice mindful eating. Eat until you are satisfied. Do not go to the point of being full.    5.  Eliminate carbonated/sugar sweetened beverages!   - NO juice, soda, diet soda, sweet tea, lemonade, Kool-Aid    6.  Research vitamins and try protein shakes.   - Protein shakes can be made at home using a whey protein powder + water or low fat milk or an unsweetened milk alternative    - do not add frozen fruit, bananas or peanut butter to homemade protein shakes   - Premade protein shakes should have at least 20g of protein and carbs/sugars should be in the single digits    - avoid products labeled keto    - options include: Premier Protein, Ensure/Boost Max Protein    - be sure to check the nutrition label on any product you choose    7.  EXERCISE:     8.  Your next follow-up visit will determine if you are ready for the nutrition class.

## 2022-11-09 NOTE — Unmapped (Signed)
[facility] Outpatient Nutrition [facility]   Medical Nutrition Therapy Consultation       Visit Type: Initial Pre-Bariatric Nutrition Assessment    Referral Reason: Richard [name] is a 57 y.o. male seen for medical nutrition therapy in preparation for bariatric surgery. Patient is unsure which procedure they would like to pursue at this time. He has been considering weight loss surgery for several years - since 2015. His primary motivation is to improve health and kidney transplant .    His interim medical history is significant for hypertension, diabetes mellitus,type 2, GERD, OSA - patient denies, proteinuria, CKD, stage 4/5, type 2 MI, heart failure.    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  Ideal Body Weight: 75.36 kg  Weight in (lb) to have BMI = 25: 173.9  GOAL weight per patient: 200 lb    Nutrition Risk Screening:   I'm going to read you two statements that people have made about their food situation. For each statement, please tell me whether the statement was often true, sometimes true, or never true for your household in the last 12 months.     1. ???We worried whether our food would run out before we got money to buy more.???   never true    2. ???The food that we bought just didn't last, and we didn't have money to get more.  never true    Nutrition Focused Physical Exam:  Nutrition Evaluation  Overall Impressions: Nutrition-Focused Physical Exam not indicated due to lack of malnutrition risk factors. (11/09/22 1610)  Nutrition Designation: Obese class III  (BMI > 39.99 kg/m2) (11/09/22 0914)    Malnutrition Screening:   Patient does not meet AND/ASPEN criteria for malnutrition at this time (11/09/22 0914)    Biochemical Data, Medical Tests and Procedures:  All pertinent labs and imaging reviewed by Brendia Sacks at 9:17 AM 11/09/2022.    Lab Results   Component Value Date    HGB 12.9 08/04/2022    HCT 38.4 (L) 08/04/2022    MCV 80.6 08/04/2022    IRON 72 05/15/2012    TIBC 184 (L) 05/15/2012    FERRITIN 432 (H) 05/15/2012    VITAMINB12 962 (H) 01/25/2012    FOLATE 13.6 01/25/2012    VITDTOTAL 19.4 (L) 04/24/2022     Lab Results   Component Value Date    ALKPHOS 112 08/04/2022    ALT 14 08/04/2022    AST 13 08/04/2022    BUN 41 (H) 08/04/2022  CREATININE 4.60 (H) 08/04/2022    GFRNONAA 27 (L) 07/15/2021    GFRAA >=60 03/27/2017     Lab Results   Component Value Date    CHOL 211 (H) 04/24/2022    HDL 33 (L) 04/24/2022    LDL 104 (H) 04/24/2022    NONHDL 178 (H) 04/24/2022    TRIG 369 (H) 04/24/2022    A1C 8.3 (H) 08/04/2022    TSH 4.009 08/29/2021       Medications and Vitamin/Mineral Supplementation:   All nutritionally pertinent medications reviewed on 11/09/2022.   Nutritionally pertinent medications include: amlodipine, atorvastatin, cholecalciferol, coreg, lasix, insulin regimen, elemental multivitamin, mag-ox, prograf, spironolactone     He is taking nutrition supplements.     Current Outpatient Medications   Medication Sig Dispense Refill    amlodipine (NORVASC) 5 MG tablet Take 1 tablet (5 mg total) by mouth daily. 90 tablet 3    atorvastatin (LIPITOR) 40 MG tablet Take 1 tablet (40 mg total) by mouth nightly. Med to prevent heart attacks and help cholesterol 90 tablet 3 blood-glucose meter,continuous (DEXCOM G6 RECEIVER) Misc 1 each by Miscellaneous route in the morning. Dispense 1 receiver annually.  Sent to [facility]. 1 each 0    blood-glucose sensor (DEXCOM G6 SENSOR) Devi Use as directed to monitor blood glucose 3 each 0    blood-glucose sensor (DEXCOM G6 SENSOR) Devi Use to check blood glucose and change every 10 days. 9 each 3    blood-glucose transmitter (DEXCOM G6 TRANSMITTER) Devi 1 each by Miscellaneous route Every three (3) months. [facility] pharmacy 3 each 4    blood-glucose transmitter (DEXCOM G6 TRANSMITTER) Devi Use as directed to monitor blood glucose 1 each 3    carvedilol (COREG) 6.25 MG tablet Take 1 tablet (6.25 mg total) by mouth two (2) times a day. 180 tablet 3    CHOLECALCIFEROL, VITAMIN D3, (VITAMIN D3 ORAL) Take 2,000 Units by mouth daily.      furosemide (LASIX) 40 MG tablet Take 1 tablet (40 mg total) by mouth two (2) times a day. 180 tablet 4    insulin aspart (NOVOLOG FLEXPEN) 100 unit/mL (3 mL) injection pen Inject 0.4 mL (40 Units total) under the skin Three (3) times a day before meals. 60 mL 3    insulin detemir U-100 (LEVEMIR) 100 unit/mL (3 mL) injection pen Inject 0.2 mL (20 Units total) under the skin nightly. 30 mL 3    magnesium oxide (MAG-OX) 400 mg (241.3 mg elemental magnesium) tablet Take 1 tablet (400 mg total) by mouth daily.      multivitamin (TAB-A-VITE/THERAGRAN) per tablet Take 1 tablet by mouth daily.      mycophenolate (CELLCEPT) 250 mg capsule Take 1 capsule (250 mg total) by mouth two (2) times a day. 60 capsule 11    pen needle, diabetic 32 gauge x 5/32 (4 mm) Ndle Use with insulin up to 4 times a day as needed. (Patient not taking: Reported on 08/04/2022) 100 each 0    spironolactone (ALDACTONE) 25 MG tablet Take 1 tablet (25 mg total) by mouth daily. 30 tablet 3    tacrolimus (PROGRAF) 0.5 MG capsule Take 2 capsules (1 mg total) by mouth two (2) times a day. 360 capsule 3     No current facility-administered medications for this visit. Nutrition History:     Dietary Restrictions:  Patient may have a sensitivity to some bread products.     Gastrointestinal Issues: Reflux    Hunger and Satiety: Denied issues.  Physical Activity:  Physical activity level is moderate with 3-5 days of exercise.   Type: walk  Barriers to physical activity: none    Diet Recall:   Up: 7 am  B: 9 am - breakfast sandwich  L: 12 pm - hamburger, fries, soda, sandwich - white bread, [place] - subs  D: skips - 6 pm    Food-Related History:  Snacks: rare  Beverages: soda, lemonade, sweet tea, fruit punch, orange juice  Dining Out: daily - twice a day     Eating Behaviors:  Excessive Hunger: Patient denies.  Emotional Eating: He noted eating to deal with feelings other than hunger including  Patient denies .  Grazing: Frequently grazes throughout the day.   Cravings: sugar-sweetened beverages   Nighttime Eating: None.  Fast Eating: Consumes foods slowly.   Overeating: Consumes sugar-sweetened beverages in large portions.  Patient described multiple binge eating habits including digusted or guilty after eating .    Nutritional Needs:  Energy: 2164 kcals [Per [place] St-Jeor Equation (AF of 1.0 for weight loss) using last recorded weight, 131.5 kg (11/09/22 0914)]  Protein: 79-95 gm [1.0-1.2 gm/kg using other (comment) (BMI of 25), 79 kg (11/09/22 0914)]  Carbohydrate:   [< / equal to 45% of kcal]  Fluid: 2000-3000 mL per day [Other (Comment)]      Nutrition Goals & Evaluation      Demonstrate understanding of bariatric nutrition  (New)  Demonstrate behavioral change  (New)  Meet nutritional needs (New)  Compliance with nutrition goals (New)  (0.5-1.0)#/wk weight loss prior to next RD visit (New)      Nutrition goals reviewed, and relevant barriers identified and addressed: none evident. He is evaluated to have good willingness and ability to achieve nutrition goals.     Nutrition Assessment       The patient is currently    meeting estimated nutrition needs with current intake. Per anthropometric data, BMI: 41.61 classifies patient as obese (class 3). Nutrition related labs from 08/04/2022 were reviewed and showed elevated blood glucose levels, vitamin D deficiency, iron deficiency, and altered lipid panel.  24-hour recall/usual intake indicates patient usually eats 2 meals per day. Overall food choices are inadequate in fruits, vegetables, lean protein, and whole grains. Patient with a significant reliance on convenience foods/foods prepared outside of the home. Beverages are  sweetened with sugar and are non-carbonated. Current level of physical activity is not quite adequate to facilitate significant weight loss.    For eating disorder/behavior evaluation please see psych note.       Bariatric Surgery Assessment: RD is withholding assessment for insurance approval for weight loss surgery pending patient follow-up where success with plan goals and food record/usual intake/24-hour recall will be assessed.            Nutrition Intervention        - Nutrition Education: bariatric surgery diet/eating style. Education resources provided include: after visit summary with patient instructions      Nutrition Plan:   Please see patient instructions.     Patient will need the nutrition class prior to RD approval for bariatric surgery. Patient will be scheduled for class in person on either 06/07 or 06/21 pending work schedule. Patient instructed to messaged RD in MyChart with preferred date. Patient will also need an additional follow-up visit after class to assess adherence to nutrition goals and understanding of the pre op diet, post op diet progression, protein/fluid goals, and exercise/vitamin recommendations.     Food/Nutrition-related history, Anthropometric  measurements, Biochemical data, medical tests, procedures, Patient understanding or compliance with intervention and recommendations , and Effectiveness of nutrition interventions will be assessed at time of follow-up. Recommendations for Clinical Team:  Please encourage a consistent meal/snack schedule with a focus on protein. Exercise outside of daily routine should also be encouraged for 150-300 minutes per week.        Time spent 56 minutes

## 2022-11-09 NOTE — Unmapped (Signed)
[place] [facility] of [facility]  Division of Gastrointestinal Surgery  Bariatric Surgery  Please check out at the scheduler's desk.    For Approval I need you to have:  Endoscopy  Cardiac Clearance    Blood work: 11/09/2022, it will expire 3-6 months from date of blood drawn.     Before our next visit, please:   Read all of the information provided in your white binder    Start making the dietary changes the Dietitian discussed with you today. Keep a food diary. No sugary drinks (avoid juices, sodas, ginger ale, sweet tea)     Your dietitian will assist with scheduling the mandatory Group Nutrition Class.   Any questions about RD visits should go directly to her (including if you have been cleared).      After your nutrition class, we will call you to schedule the Psychiatric evaluation  If you are under the care of a psychiatric provider, we will need a Mental Health Letter.     I will see you after you complete your formal work-up to review that all steps have been completed prior to surgery  Only the surgeon can give you the surgery date      Thank you,    Noreen Mackintosh L. Morayma Godown NP      Social Media:  We are on Facebook- [facility] Support Group  Search [place] [facility] click join.    Support Groups:     Www.zoom.com   ID: [id number]  Password: [email or web address]  1st  Monday of each month at 6 pm   3rd Thursday of each month at 1 pm    check website for more information   [email or web address]      Important websites:  www.[email or web address].com, or [email or web address].[email or web address]/, www.[email or web address].com, www.bariatricfoodsource.com, www.bariatricfoodie.com    Bariatric podcasts:  Jomarie Longs PHD podcast: [facility]  Bariatric nutrition coach podcast:  Corrinne Eagle  Active bariatric nutrition podcast:  Ethelda Chick  Bariatric surgery success podcast: Dr. Kary Kos

## 2022-11-09 NOTE — Unmapped (Addendum)
NEW Outpatient Bariatric Surgery Note    History   Chief Complaint: Obesity: BMI: 41.68, (Ht: 5' 10, Wt: 290#) with an interest in Bariatric Surgery. Insurance: BCBS.  PCP: Mel Almond, MD     HPI) Obesity History:   Richard [name] is a 57 y.o. with obesity and related co-morbidities who is seen today to further discuss surgical candidacy. Mikkel was evaluated by the [facility] Obesity Team on 09/26/2022 by  Jessy Oto RD  who reviewed his history of obesity, weight loss attempts, medical therapy tried, and medications or risk factors for weight gain. Travoris was referred to the Bariatric Surgery Intake Team today based on his appropriate candidacy. Follow up visits: 0.  He expresses interest in being considered for bariatric surgery as a means to improve health and overall quality of life.   Has been contemplating surgery for 3-5 and has medical documentation supporting his history of obesity as far back as his 30's. Jowan has attempted and failed multiple prior efforts to achieve or maintain significant weight loss, including structured exercise regimen and self-dieting, structured diet plan. As such, a follow up bariatric surgery consultation has been scheduled for today.   Obesity age of onset: 55's. Highest adult weight: 340#. Goal weight: 200#.   His personal motivation for bariatric surgery is based on his desire to control his eating.   He feels obesity has been affecting his life and feels the excess body weight interferes with ADL's and negatively impacts quality of living.  Family Hx of Obesity: no    PAST MEDICAL HISTORY:  Past Medical History:   Diagnosis Date    COVID-19 07/18/2019    Family history of malignant neoplasm of prostate 06/23/2015    Hepatic cirrhosis (CMS-HCC) 06/23/2015    Overview:  Secondary to NASH; followed by Dr. Yevonne Pax, GI.  Last Assessment & Plan:  Relevant Hx: Course: Daily Update: Today's Plan:     Hypertension     Hypogonadism in male 02/12/2014    Liver cirrhosis secondary to NASH (nonalcoholic steatohepatitis) (CMS-HCC)     s/p liver transplant 2013     Patient Active Problem List    Diagnosis Date Noted    (HFpEF) heart failure with preserved ejection fraction (CMS-HCC) 10/26/2021    Type 2 MI (myocardial infarction) (CMS-HCC) 08/29/2021    Immunosuppression due to drug therapy (CMS-HCC) 05/04/2020    CKD (chronic kidney disease) stage 4, GFR 15-29 ml/min (CMS-HCC) 05/04/2020    Proteinuria due to type 2 diabetes mellitus (CMS-HCC) 06/23/2015    Type 2 diabetes mellitus with hyperglycemia, with long-term current use of insulin (CMS-HCC) 12/03/2013    Hypertension 03/17/2013    Liver replaced by transplant (CMS-HCC) 12/20/2011    H/O gastroesophageal reflux (GERD) 07/31/2011    Class 3 obesity (CMS-HCC) 07/31/2011    Obstructive sleep apnea syndrome 07/31/2011    Acanthosis nigricans 05/03/2011       PAST SURGICAL HISTORY:  Gallbladder absent.  Any hx of Hernia repair: no    Past Surgical History:   Procedure Laterality Date    liver transpant      LIVER TRANSPLANTATION  2013    PR COLONOSCOPY W/BIOPSY SINGLE/MULTIPLE N/A 08/13/2018    Procedure: COLONOSCOPY, FLEXIBLE, PROXIMAL TO SPLENIC FLEXURE; WITH BIOPSY, SINGLE OR MULTIPLE;  Surgeon: Neysa Hotter, MD;  [facility];  Service: Gastroenterology    PR COLSC FLX W/RMVL OF TUMOR POLYP LESION SNARE TQ N/A 08/13/2018    Procedure: COLONOSCOPY FLEX; W/REMOV TUMOR/LES BY SNARE;  Surgeon: Pennie Rushing  Claudean Kinds, MD;  Location: [facility];  Service: Gastroenterology    PR UPPER GI ENDOSCOPY,BIOPSY N/A 07/13/2015    Procedure: UGI ENDOSCOPY; WITH BIOPSY, SINGLE OR MULTIPLE;  Surgeon: Joaquin Music, MD;  Location[facility];  Service: Gastroenterology       Preoperative Risk Factors:  Any history of anesthesia related complications, including intubation difficulty: no    Any history of bleeding or clotting disorder: no    Cardiac Hx:   HTN, on amlodipine, carvedolol  NO MI, PVD, or CVA  08/2021: ECHO  NUCLEAR MYOCARDIAL PERFUSION IMAGING WITH SPECT, 08/13/2019 EF 53%  Summary    1. Technically difficult study due to chest wall/lung interference.    2. An ultrasound enhancing agent was used to improve the visualization of  the left ventricular cavity and endocardial borders.    3. Concentric left ventricular hypertrophy with overall normal systolic  function (EF 55-60%), with grade 2 diastolic dysfunction and segmental  hypokinesis of the mid-inferior wall.    4. The right ventricle is mildly to moderately dilated in size, with normal  systolic function.    5. There are no significant structural valvular abnormalities.    6. Mild to moderate biatrial enlargement.  Not under routine care of cardiologist. Last OV: 2 years  Dr Schuyler Amor  Any exercise intolerance: denies    The ASCVD Risk score (Arnett DK, et al., 2019) failed to calculate.     Diabetes Hx: 3-4 years ago, started with use of Prograf  Currently on insulin  Individualized Metabolic Surgery Score: 81  Surgery recommended: Gastric bypass  Resource: [email or web address]  Lab Results   Component Value Date    A1C 8.3 (H) 08/04/2022         MEDICATIONS:    Current Outpatient Medications:     amlodipine (NORVASC) 5 MG tablet, Take 1 tablet (5 mg total) by mouth daily., Disp: 90 tablet, Rfl: 3    atorvastatin (LIPITOR) 40 MG tablet, Take 1 tablet (40 mg total) by mouth nightly. Med to prevent heart attacks and help cholesterol, Disp: 90 tablet, Rfl: 3    blood-glucose meter,continuous (DEXCOM G6 RECEIVER) Misc, 1 each by Miscellaneous route in the morning. Dispense 1 receiver annually.  Sent to [facility]., Disp: 1 each, Rfl: 0    blood-glucose sensor (DEXCOM G6 SENSOR) Devi, Use as directed to monitor blood glucose, Disp: 3 each, Rfl: 0    blood-glucose sensor (DEXCOM G6 SENSOR) Devi, Use to check blood glucose and change every 10 days., Disp: 9 each, Rfl: 3    blood-glucose transmitter (DEXCOM G6 TRANSMITTER) Devi, 1 each by Miscellaneous route Every three (3) months. [facility] pharmacy, Disp: 3 each, Rfl: 4    blood-glucose transmitter (DEXCOM G6 TRANSMITTER) Devi, Use as directed to monitor blood glucose, Disp: 1 each, Rfl: 3    carvedilol (COREG) 6.25 MG tablet, Take 1 tablet (6.25 mg total) by mouth two (2) times a day., Disp: 180 tablet, Rfl: 3    CHOLECALCIFEROL, VITAMIN D3, (VITAMIN D3 ORAL), Take 2,000 Units by mouth daily., Disp: , Rfl:     furosemide (LASIX) 40 MG tablet, Take 1 tablet (40 mg total) by mouth two (2) times a day., Disp: 180 tablet, Rfl: 4    insulin aspart (NOVOLOG FLEXPEN) 100 unit/mL (3 mL) injection pen, Inject 0.4 mL (40 Units total) under the skin Three (3) times a day before meals., Disp: 60 mL, Rfl: 3    insulin detemir U-100 (LEVEMIR) 100 unit/mL (3 mL)  injection pen, Inject 0.2 mL (20 Units total) under the skin nightly., Disp: 30 mL, Rfl: 3    magnesium oxide (MAG-OX) 400 mg (241.3 mg elemental magnesium) tablet, Take 1 tablet (400 mg total) by mouth daily., Disp: , Rfl:     multivitamin (TAB-A-VITE/THERAGRAN) per tablet, Take 1 tablet by mouth daily., Disp: , Rfl:     mycophenolate (CELLCEPT) 250 mg capsule, Take 1 capsule (250 mg total) by mouth two (2) times a day., Disp: 60 capsule, Rfl: 11    pen needle, diabetic 32 gauge x 5/32 (4 mm) Ndle, Use with insulin up to 4 times a day as needed. (Patient not taking: Reported on 08/04/2022), Disp: 100 each, Rfl: 0    spironolactone (ALDACTONE) 25 MG tablet, Take 1 tablet (25 mg total) by mouth daily., Disp: 30 tablet, Rfl: 3    tacrolimus (PROGRAF) 0.5 MG capsule, Take 2 capsules (1 mg total) by mouth two (2) times a day., Disp: 360 capsule, Rfl: 3  Current use of AOM (anti-obesity medication): Ozempic STOPPED- due to pain in the abdomen, and vomiting    NSAIDs use: Denies    ALLERGIES:  Patient has no known allergies.    FAMILY HISTORY:  family history includes Diabetes in his father and paternal grandfather; Liver disease in his maternal uncle.  Family hx of colon cancer: No    Lynch Syndrome: No      REVIEW OF SYMPTOMS:  A ten-point ROS was performed and is negative except for the pertinent findings in the HPI. Denies complaints or concerns.   Denies palpitations, swelling of feet/legs, unwanted hair on face or chest, chest pain, fever, racing heart, shortness of breath, coughing, nausea, vomiting, headaches, change in bowel habits, bruising, bleeding, fainting, or seizures    GI Symptoms:  He has heartburn when he eats: red sauce  Otherwise no Reflux.  He was on Ozempic- caused abd pain and vomiting, stopped 1 month ago  Denies a GI problems such as IBS, Crohn's, motility issues, dysphagia, strictures.  Denies a hx of Gout.  Denies a hx Pancreatitis.  Has a hx of NASH/Fatty liver.  Liver transplant 2013, doing well, on Prograf and Myfortiq  Denies a hx of Hepatitis.  Bowel habits are regular. No changes in consistency, pattern.   FIB-4 Calculation: 0.77 at 08/04/2022  9:46 AM  Calculated from:  SGOT/AST: 13 U/L at 08/04/2022  9:46 AM  SGPT/ALT: 14 U/L at 08/04/2022  9:46 AM  Platelets: 245 10*9/L at 08/04/2022  9:46 AM  Age: 15 years    Testing:   EGD: 2017      - Normal esophagus.                     - Normal stomach.                     - Normal examined duodenum. Biopsied.                     - The procedure was expedited due to patient agitation and                      difficulties with oxygenation.  Colonoscopy: 08/13/2018  Impression:        - The examined portion of the ileum was normal.                     - One 3 mm polyp in the cecum, removed with a cold biopsy                      forceps. Resected and retrieved.                     - One 6 mm polyp in the ascending colon, removed with a                      cold snare. Resected and retrieved.                     - Two 2 mm polyps in the rectum, removed with a cold                      biopsy forceps. Resected and retrieved. - The examination was otherwise normal  Other GI testing: CT 2023 abdomen    GU/GYN/Sexual health:  He has a dx of kidney disease, r/t prograf   ESRD. Normal urine output. Not on HD, or PD.   Renal calculi: 1 x   Prostate health: denies issues.   ABD CT 2023: Calcification of the central prostate noted.    Smooth renal contours. No obvious solid renal mass lesion. Punctate 2 mm nonobstructing right renal calculi (3:80) No ureteral dilatation or collecting system distention.      BLADDER: Mostly decompressed, limiting evaluation.     Sexual Health:   Sexually active: yes     Sexual dysfunction r/t obesity: yes    Sleep Patterns:  STOPBANG: 4  Hours of sleep: 8, refreshing: yes    Snoring: denies  Sleep aid: melatonin  Sleep apnea/Last polysomnogram: 01/12/2014 [facility].  AHI 2.6.  REM AHI 2.6.  Supine AHI 6.4. Mean Oxygen 96%  Weight at the time of the study 266 pounds  No CPAP therapy    Activity Habits:  He can walk at least a mile without stopping, no SOB, or CP  Denies angina.  Able to ambulate independently: yes    Limitations in physical mobility r/t weight: no    ADL's: independent, no difficulty    Eating Habits:  Please see Registered Dietitian's intake eval for full details  Vitamin intake: yes, D, MVI (gummy)  Any hx of anemia: mild  Pica: no  He loves sugary drinks- Cola  Dental: cleanings, yes, last visit 1 year  Meal skipping:  he eats 2 meals a day  Significant sweets craver: strong    BEDS-7 score: 0/7    Psychological Overview:  Social History     Social History Narrative    From [place]    He was in the [employer].    Now lives in [place]    He works in Engineer, petroleum        Married    1 child (age 74.5)     Mental Health Diagnoses: No    Psychiatric medications: none  Psychotherapy: none   Psychiatric hospitalizations, suicidal thoughts, ideations, attempts or legal trouble in past: never.   Obesity affecting social situations: no    Please see upcoming Psychologist's intake eval for full details.    Frequency of alcohol: never.  Nicotine Hx (smoke, vape, chew): never  Illicit drugs: none. Current use of CBD oil, gummies, edibles: none  Any past issues with addiction: none.     Knows anyone who has had weight loss surgery: yes  Any concern about the ability to afford vitamins, follow up care, protein shakes: no    Health Maintenance  Verified cancer screening by primary care physician   2017 Colonoscopy    RELEVANT TESTS/LAB RESULTS:  Lab Results   Component Value Date    WBC 8.9 11/09/2022    HGB 12.9 11/09/2022    HCT 37.4 (L) 11/09/2022    PLT 267 11/09/2022    CHOL 211 (H) 04/24/2022    TRIG 369 (H) 04/24/2022    HDL 33 (L) 04/24/2022    ALT 26 11/09/2022    AST 21 11/09/2022    NA 135 11/09/2022    K 3.1 (L) 11/09/2022    CL 97 (L) 11/09/2022    CREATININE 4.79 (H) 11/09/2022    BUN 74 (H) 11/09/2022    CO2 24.1 11/09/2022    TSH 2.539 11/09/2022    PSA 0.24 04/24/2022    INR 1.05 07/26/2022    GLUF 138 10/06/2014     Lab Results   Component Value Date    VITAMINB12 721 11/09/2022   ,   Lab Results   Component Value Date    IRON 95 11/09/2022    TIBC 267.1 11/09/2022    FERRITIN 169.8 11/09/2022     Lab Results   Component Value Date    PROT 7.2 11/09/2022    ALBUMIN 3.2 (L) 11/09/2022       Physical Exam   BP 168/90 (BP Position: Sitting)  - Pulse 66  - Ht 177.8 cm (5' 10)  - Wt (!) 131.8 kg (290 lb 8 oz)  - SpO2 98%  - BMI 41.68 kg/m??   Exam:  Alert, pleasant Hispanic male  Body Measurements in inches: Neck 18.5, Chest 58, Waist 54, Hips 43.5, LUA, 17.5 R thigh 22  Waist Hip Ratio: 1.01 (>1.0 is high risk- men, >0.85 high risk in women)  Presents:  alone  Respirations:  Regular, easy, without SOB  Lungs: Clear to auscultation, no wheezing. No labored breathing.   Cardio:  RRR, S1S2, No murmur  Extremities:  MAE, without limitations. Presence of edema: YES, pitting 2+ pedal  Abdomen: Obese, soft. + BS x 4  Gait:  Steady  Skin:  Dry, no acanthosis. no skin tags.     Assessment     Encounter Diagnoses   Name Primary?    Primary hypertension     Chronic heart failure with preserved ejection fraction (CMS-HCC)     Pre-op testing     Class 3 obesity (CMS-HCC) Yes    Gastroesophageal reflux disease, unspecified whether esophagitis present     Type 2 diabetes mellitus treated with insulin (CMS-HCC)     Encounter for pre-bariatric surgery counseling and education      Body surface area: 2.55 meter squared  Adjusted weight: 212 lbs  Ideal Body weight : 160 lbs (not goal)  Pounds over ideal weight: 130  Anticipated weight loss: 87 lbs  Surgical Goal weight: 203 lbs (based on statistic of 30% total body weight loss)  Weight to lose before surgery: 10 lbs    Plan   Vivan is a 57 y.o. year old who is interested in weight loss and meets standard bariatric surgery criteria: BMI 41 with hypertension, GERD, type 2 diabetes.  He is status post liver transplant, he has end-stage renal disease.  We reviewed his medical and surgical history, previous weight loss attempts, ideal body weight/IBW, current eating habits, exercise habits, sleep habits.   I reviewed the medical weight loss therapy notes, he was considered a good candidate based on their intake/consult.   He has a documented weight history of obesity for the past 5 years.   This provider agrees and feels Jathan is a good candidate for bariatric surgery based on: failed attempts at medical weight loss and a  strong genetic predisposition.   He appears to have a high level of motivation and realistic expectations, we reviewed that weight loss surgery is a tool to assist in weight loss, with metabolic effects, controls portions and hunger, with candidate's compliance.  He will initiate the Bariatric Surgery process. We reviewed his candidacy, goals, program requirements, expectations, and typical outcomes.  At this time, he feels ready to move forward with the bariatric surgery process, as well as continue to establish healthier eating, exercise, and sleep habits.     He will begin the pre-surgical dietary modification based on bariatric surgery RD (see today's note for details): focus on protein, avoid meal skipping, eliminate liquid calories, improve eating pattern, focus on well balanced meals, meal planning, reading food labels, avoidance of all sugary beverages/caloric drinks (supplement with water).  We also highly recommended mindfulness with eating and slowing down speed of eating.   He will need to attend a nutrition class in the near future and have a follow up with the RD afterwards to demonstrate understanding of the provided materials, education, vitamin requirements.  He will increase exercise outside of his daily routine, up to 150-300 minutes per week, as tolerated. This can include walking, chair exercises, cardio, weights, bands, and resistance training.   We reviewed realistic weight loss goal setting during the pre-operative phase, such as a 5-10% excess body weight loss before surgery, to assist with liver reduction prior to surgery.  I educated Rector on realistic weight loss expectations post-surgery (30% total body weight loss, and/or a 50% excess body weight loss-calculated for him) and a likely improvement in co-morbidities.    He has good sleep habits and no known sleep apnea, discussed the positive correlation between good sleep and weight loss.     Education   Introduction and Overview of Gastric Sleeve and Gastric Bypass.   Overview of anatomical changes  Benefits and results  Risks  Need for preoperative changes to initiate healthier eating and activity habits as well as improved outcomes  Surgical Risk Factors or provider concerns at time of this consult: History of extensive abdominal wall surgery  Postoperative Information:   (length of stay, time off from work, physical activity, pain, constipation, nausea, stricture, stenosis, reflux, vitamin deficiency risk, dehydration, dumping syndrome, hair loss, skin laxity, changes in sexuality and hormone fluctuations, and need for long term follow up care. Use of protein supplements and need for vitamin supplementation).   History of cholecystectomy  Reviewed cost associated with Bariatric Surgery- necessary lifelong vitamins (out of pocket), supplements, co-pays, follow up care.     Patient Preference:  [facility] Gastric Bypass or Vertical Sleeve Gastrectomy     Preoperative requirements/screening:     1.  Labs. Nutrient screening.  Nicotine screen. Urine tox screen. Done: Ordered   2.  GI Evaluation: EGD screen H-pylori.  Ordered   3.  Nutrition Class   4.  Psychosocial-behavioral evaluation TBS after nutrition class   5.  Financial questions refer to [facility] financial navigator  6.  Cardiopulmonary evaluation: Cardiac clearance with Dr. Willa Rough ordered   7.  Join [employer] [facility] Support  Group. Attend Zoom based online support group   8.  Avoidance of alcohol, blood thinners, NSAIDS.  9.  He has been given the [facility] bariatric surgery education booklet, A Guide to Surgical Weight Loss.  Advised to read weekly until surgery.   10.  Eliminate soda    He understands the need to see his PCP for routine health care needs outside of Obesity/Bariatric/Metabolic Surgery.      Return to Clinic:      4-6 weeks with RD  Follow up with NP after steps have been completed for final approval    Ihan Pat L. Terrian Ridlon MSN, FNP APRN-BC  [employer]- Dept of Bariatric Surgery  11/09/2022    Attestation signed by  Encarnacion Chu MD at 11/09/2022    Case reviewed. Agree with care. I was available.     Length of visit : 65 minutes

## 2022-11-10 DIAGNOSIS — E612 Magnesium deficiency: Principal | ICD-10-CM

## 2022-11-10 DIAGNOSIS — I5032 Chronic diastolic (congestive) heart failure: Principal | ICD-10-CM

## 2022-11-10 DIAGNOSIS — Z5181 Encounter for therapeutic drug level monitoring: Principal | ICD-10-CM

## 2022-11-10 DIAGNOSIS — E876 Hypokalemia: Principal | ICD-10-CM

## 2022-11-10 DIAGNOSIS — N189 Chronic kidney disease, unspecified: Principal | ICD-10-CM

## 2022-11-10 DIAGNOSIS — Z944 Liver transplant status: Principal | ICD-10-CM

## 2022-11-10 LAB — MAGNESIUM: MAGNESIUM: 1.8 mg/dL (ref 1.6–2.6)

## 2022-11-10 LAB — PHOSPHORUS: PHOSPHORUS: 5.9 mg/dL — ABNORMAL HIGH (ref 2.4–5.1)

## 2022-11-10 LAB — BILIRUBIN, DIRECT: BILIRUBIN DIRECT: 0.2 mg/dL (ref 0.00–0.30)

## 2022-11-10 LAB — GAMMA GT: GAMMA GLUTAMYL TRANSFERASE: 105 U/L — ABNORMAL HIGH

## 2022-11-10 MED ORDER — POTASSIUM CHLORIDE ER 20 MEQ TABLET,EXTENDED RELEASE(PART/CRYST)
ORAL_TABLET | Freq: Every day | ORAL | 0 refills | 3 days | Status: CP
Start: 2022-11-10 — End: 2022-11-13
  Filled 2022-11-10: qty 3, 3d supply, fill #0

## 2022-11-10 MED FILL — NOVOLOG FLEXPEN U-100 INSULIN ASPART 100 UNIT/ML (3 ML) SUBCUTANEOUS: SUBCUTANEOUS | 50 days supply | Qty: 60 | Fill #3

## 2022-11-10 NOTE — Unmapped (Addendum)
Patient completed labs yesterday but not for liver txp. Spoke to patient and discussed K-3.1. Patient reports he has had some hand cramping the last few days, which occurred previously when he had a low K. Discussed dietary modifications to improve serum K and encouraged him to notify his nephrologist, who prescribes his lasix, but if sx worsen to seek urgent medical attn. He verbalized understanding. Verified that the tac level would be reliable if added onto yesterday's sample and ordered labs to complete txp set.    Messaged Dr.Zeitler re.patient's K and phos. He recommended patient's lasix be reduced to 40mg  once daily. Contacted patient again and relayed recommendation with instruction to reach out to nephrology if fluid retention worsens.  He verbalized understanding.

## 2022-11-11 LAB — TACROLIMUS LEVEL, TROUGH: TACROLIMUS, TROUGH: <1 ng/mL — ABNORMAL LOW (ref 5.0–15.0)

## 2022-11-11 NOTE — Unmapped (Signed)
Patient paged on-call [facility] to inquire if he should take some po potassium replacement for his low K. Discussed with Dr.Barritt, who recommended daily x 3 days, adding that it would be fine with the spironolactone, since it is short-term, and he is still on lasix. Patient said he would repeat labs next week to monitor the level.

## 2022-11-13 ENCOUNTER — Ambulatory Visit: Admit: 2022-11-13 | Discharge: 2022-11-14 | Payer: PRIVATE HEALTH INSURANCE

## 2022-11-13 DIAGNOSIS — Z944 Liver transplant status: Principal | ICD-10-CM

## 2022-11-13 DIAGNOSIS — Z01810 Encounter for preprocedural cardiovascular examination: Principal | ICD-10-CM

## 2022-11-13 DIAGNOSIS — Z794 Long term (current) use of insulin: Principal | ICD-10-CM

## 2022-11-13 DIAGNOSIS — Z79899 Other long term (current) drug therapy: Principal | ICD-10-CM

## 2022-11-13 DIAGNOSIS — I21A1 Myocardial infarction type 2: Principal | ICD-10-CM

## 2022-11-13 DIAGNOSIS — R809 Proteinuria, unspecified: Principal | ICD-10-CM

## 2022-11-13 DIAGNOSIS — E1165 Type 2 diabetes mellitus with hyperglycemia: Principal | ICD-10-CM

## 2022-11-13 DIAGNOSIS — I4819 Other persistent atrial fibrillation: Principal | ICD-10-CM

## 2022-11-13 DIAGNOSIS — G4733 Obstructive sleep apnea (adult) (pediatric): Principal | ICD-10-CM

## 2022-11-13 DIAGNOSIS — I5032 Chronic diastolic (congestive) heart failure: Principal | ICD-10-CM

## 2022-11-13 DIAGNOSIS — E1129 Type 2 diabetes mellitus with other diabetic kidney complication: Principal | ICD-10-CM

## 2022-11-13 DIAGNOSIS — I10 Essential (primary) hypertension: Principal | ICD-10-CM

## 2022-11-13 DIAGNOSIS — N184 Chronic kidney disease, stage 4 (severe): Principal | ICD-10-CM

## 2022-11-13 DIAGNOSIS — D84821 Immunosuppression due to drug therapy (CMS-HCC): Principal | ICD-10-CM

## 2022-11-13 LAB — VITAMIN D 25 HYDROXY: VITAMIN D, TOTAL (25OH): 21.2 ng/mL (ref 20.0–80.0)

## 2022-11-13 LAB — NICOTINE SCREEN, URINE
ANABASINE, URINE: <2 ng/mL
COTININE, URINE: <5 ng/mL
NICOTINE, URINE: <5 ng/mL
NORNICOTINE, URINE: <2 ng/mL

## 2022-11-13 NOTE — Unmapped (Signed)
Cardiology Consultation Note    Requesting Provider: Zettie [name], Richard [name]*   Primary Provider: Mel Almond, MD     Patient seen today in clinic by himself.    Reason for Consult:   57 yo male seen in cardiology consultation at the request of Drs.Richard [name], Richard [name], F*  and Richard Almond, MD for preoperative cardiology evaluation and risk assessment prior to bariatric surgery.     Assessment & Plan:      1. Preop cardiovascular exam  Patient is at increased risk of cardiac complications during bariatric surgery.    The major risks would be increased CHF that might be difficult to manage given his atrial fibrillation and impaired renal function.    Careful monitoring of his inputs and outputs will be the key to preventing worsening of his CHF status perioperatively as well as preventing him from developing worsening of his renal dysfunction.    Additional cardiac testing or medication adjustment is unlikely to lower his risk at this point.      Patient may stop ASA perioperatively, usually 5 days either side of surgery and restart when surgeons feel it is safe.      2. Chronic heart failure with preserved ejection fraction (CMS-HCC)  Lasix 40 mg /day  Spironolactone 25 mg / day      3. Persistent atrial fibrillation (CMS-HCC)  Present on ECGs at least since Jan 2024  Chads points: CHF, htn, hx MI, DM  Stroke risk 4.8% per year, and 6.7% stroke/TIA/systemic embolism  Patient to discuss anticoag with PCP and possible referral to EP/ a fib clinic for consideration of rhythm control  Patient states he has had similar discussions and was holding off on anticoagulation due to CKD issues.      4. Primary hypertension  Amlodipine 5  Carvedilol 6.25      5. Type 2 MI (myocardial infarction) (CMS-HCC)  It sounds like this event occurred during an episode of increased CHF.    On ASA      6. Obstructive sleep apnea syndrome  Patient has OBSA and has used CPAP but was able to stop after losing 40 lbs.    He may need additional respiratory support post operatively with positive pressure ventilation.      7. Type 2 diabetes mellitus with hyperglycemia, with long-term current use of insulin (CMS-HCC)        8. Proteinuria due to type 2 diabetes mellitus (CMS-HCC)        9. CKD (chronic kidney disease) stage 4, GFR 15-29 ml/min (CMS-HCC)        10. Liver replaced by transplant (CMS-HCC)        11. Immunosuppression due to drug therapy (CMS-HCC)        History of Present Illness:  [name] is a 57 y.o. male with a PMHx liver transplant (2013 2/2 NASH cirrhosis), HTN, prior diagnosis of HFpEF (EF 61% 07/2021), OSA, DM2, and CKD4  secondary to HTN, T2DM and calcineurin nephrotoxicity. He had a recent creatinine of 4.60 with an eGFR of 14 ml/min on 08/04/2022. This patient has NOT started on hemodialysis or peritoneal dialysis.  Patient is being evaluated for bariatric surgery.    Cardiovascular History    I have also reviewed the patient's medical records and imaging prior to today's visit.    Functional Capacity: >4 METS     History of an arrythmia - a fib since at least Jan 2024    No known bleeding issues  History of clots: PE or DVT - no    No personal history of anesthesia reaction or sudden high fever after surgery.     No history of anemia or transfusions.     No hx of reactions to unfractionated heparin or lovenox     Contraindications (check if positive)   [ ]  Unstable coronary syndromes including unstable or severe angina or recent MI   [ ]  Decompensated heart failure including NYHA functional class IV or worsening or new-onset HF   [ ]  Significant arrhythmias including high grade AV block, symptomatic ventricular arrhythmias, supraventricular arrhythmias with ventricular rate > 100 bpm at rest, symptomatic bradycardia, and newly recognized ventricular tachycardia   [ ]  Severe heart valve disease including severe aortic stenosis or symptomatic mitral stenosis   [ x] NONE     Revised Cardiac Risk Index (check if positive)   [ ]  High-risk type of surgery   [ ]  History of ischemic heart disease (history of MI or a positive exercise test, current complaint of chest pain considered to be secondary to myocardial ischemia, use of nitrate therapy, or ECG with pathological Q waves; do not count prior coronary revascularization procedure unless one of the other criteria for ischemic heart disease is present)   [x ] History of heart failure   [ ]  History of cerebrovascular disease   [x ] Diabetes Mellitus requiring treatment with insulin   [x ] Preoperative serum creatinine > 2.0 mg/dL   [ ]  NONE      Past Medical & Surgical History:  Past Medical History:   Diagnosis Date    COVID-19 07/18/2019    Family history of malignant neoplasm of prostate 06/23/2015    Hepatic cirrhosis (CMS-HCC) 06/23/2015    Overview:  Secondary to NASH; followed by Dr. Yevonne [name], GI.  Last Assessment & Plan:  Relevant Hx: Course: Daily Update: Today's Plan:     Hypertension     Hypogonadism in male 02/12/2014    Liver cirrhosis secondary to NASH (nonalcoholic steatohepatitis) (CMS-HCC)     s/p liver transplant 2013     Past Surgical History:   Procedure Laterality Date    liver transpant      LIVER TRANSPLANTATION  2013    PR COLONOSCOPY W/BIOPSY SINGLE/MULTIPLE N/A 08/13/2018    Procedure: COLONOSCOPY, FLEXIBLE, PROXIMAL TO SPLENIC FLEXURE; WITH BIOPSY, SINGLE OR MULTIPLE;  Surgeon: Richard Hotter, MD;  [facility];  Service: Gastroenterology    PR COLSC FLX W/RMVL OF TUMOR POLYP LESION SNARE TQ N/A 08/13/2018    Procedure: COLONOSCOPY FLEX; W/REMOV TUMOR/LES BY SNARE;  Surgeon: Richard Hotter, MD;  Location: [facility];  Service: Gastroenterology    PR UPPER GI ENDOSCOPY,BIOPSY N/A 07/13/2015    Procedure: UGI ENDOSCOPY; WITH BIOPSY, SINGLE OR MULTIPLE;  Surgeon: Richard Music, MD;  Location: [facility];  Service: Gastroenterology       Allergies:  No Known Allergies    Pertinent Medications (complete listing reviewed in EMR):    Reviewed in EMR    Current Outpatient Medications:     amlodipine (NORVASC) 5 MG tablet, Take 1 tablet (5 mg total) by mouth daily., Disp: 90 tablet, Rfl: 3    atorvastatin (LIPITOR) 40 MG tablet, Take 1 tablet (40 mg total) by mouth nightly. Med to prevent heart attacks and help cholesterol, Disp: 90 tablet, Rfl: 3    blood-glucose meter,continuous (DEXCOM G6 RECEIVER) Misc, 1 each by Miscellaneous route in the morning. Dispense 1  receiver annually.  Sent to [facility]., Disp: 1 each, Rfl: 0    blood-glucose sensor (DEXCOM G6 SENSOR) Devi, Use as directed to monitor blood glucose, Disp: 3 each, Rfl: 0    blood-glucose sensor (DEXCOM G6 SENSOR) Devi, Use to check blood glucose and change every 10 days., Disp: 9 each, Rfl: 3    blood-glucose transmitter (DEXCOM G6 TRANSMITTER) Devi, Use as directed to monitor blood glucose, Disp: 1 each, Rfl: 3    carvedilol (COREG) 6.25 MG tablet, Take 1 tablet (6.25 mg total) by mouth two (2) times a day., Disp: 180 tablet, Rfl: 3    CHOLECALCIFEROL, VITAMIN D3, (VITAMIN D3 ORAL), Take 2,000 Units by mouth daily., Disp: , Rfl:     furosemide (LASIX) 40 MG tablet, Take 1 tablet (40 mg total) by mouth daily., Disp: , Rfl:     insulin aspart (NOVOLOG FLEXPEN) 100 unit/mL (3 mL) injection pen, Inject 0.4 mL (40 Units total) under the skin Three (3) times a day before meals., Disp: 60 mL, Rfl: 3    insulin detemir U-100 (LEVEMIR) 100 unit/mL (3 mL) injection pen, Inject 0.2 mL (20 Units total) under the skin nightly., Disp: 30 mL, Rfl: 3    magnesium oxide (MAG-OX) 400 mg (241.3 mg elemental magnesium) tablet, Take 1 tablet (400 mg total) by mouth daily., Disp: , Rfl:     multivitamin (TAB-A-VITE/THERAGRAN) per tablet, Take 1 tablet by mouth daily., Disp: , Rfl:     mycophenolate (CELLCEPT) 250 mg capsule, Take 1 capsule (250 mg total) by mouth two (2) times a day., Disp: 60 capsule, Rfl: 11    potassium chloride 20 MEQ ER tablet, Take 1 tablet (20 mEq total) by mouth daily for 3 days., Disp: 3 tablet, Rfl: 0    spironolactone (ALDACTONE) 25 MG tablet, Take 1 tablet (25 mg total) by mouth daily., Disp: 30 tablet, Rfl: 3    tacrolimus (PROGRAF) 0.5 MG capsule, Take 2 capsules (1 mg total) by mouth two (2) times a day., Disp: 360 capsule, Rfl: 3    blood-glucose transmitter (DEXCOM G6 TRANSMITTER) Devi, 1 each by Miscellaneous route Every three (3) months. [facility] pharmacy, Disp: 3 each, Rfl: 4    pen needle, diabetic 32 gauge x 5/32 (4 mm) Ndle, Use with insulin up to 4 times a day as needed. (Patient not taking: Reported on 11/13/2022), Disp: 100 each, Rfl: 0    Social History:  Social History     Tobacco Use    Smoking status: Former     Passive exposure: Past    Smokeless tobacco: Never   Substance Use Topics    Alcohol use: No   He reports no history of drug use.    Family History:  His family history includes Diabetes in his father and paternal grandfather; Liver disease in his maternal uncle.    No family history of premature CAD, sudden death, or problems with anesthesia.    Review of Systems:  Review of ten systems is negative or unremarkable except as stated above.    The problem list, allergies, prior labs, relevant cardiac tests, and current medications were reviewed and updated as appropriate in the [email or web address]  medical record.      Physical Exam:  VITAL SIGNS: BP 148/88 (BP Site: L Arm, BP Position: Sitting, BP Cuff Size: Large) Comment: manuel - Pulse 70  - Ht 177.8 cm (5' 10)  - Wt (!) 131.5 kg (290 lb) Comment: weight on friday- patient refused today - SpO2 96%  - BMI  41.61 kg/m??   GENERAL: WD,WN,NAD  HEENT: Normocephalic and atraumatic.Conjunctivae and sclerae clear and anicteric. No xanthelasma.   NECK: Supple, without masses, thyroid enlargement or adenopathy. No bruits.  CARDIOVASCULAR: irregularly irregular, The cardiac examination revealed normal carotid and jugular venous pulses; the left ventricular apex impulse was sustained ; the first heart sound was variable; S2 was normal with physiologic splitting; ; there was no S3 or friction rub.  RESPIRATORY: Normal respiratory effort without use of accessory muscles. Clear to auscultation bilaterally. No wheezes, rales, or ronchi.  No dullness to percussion.  ABDOMEN: Soft, not tender or distended, with audible bowel sounds. No palpable organ enlargement or abnormal masses.  EXTREMITIES:  No cyanosis, clubbing but has 1+ pitting edema to knees. Ambulatory ability satisfactory.  SKIN: No rashes, ecchymosis or petechiae. Warm, dry, and intact.  NEUROLOGIC: Appropriate mood and affect. Alert and oriented to person, place, and time. No gross motor or sensory deficits evident. Patient understands the reason for the cardiology evaluation and the implications for surgery.    Pertinent Laboratory Studies:   Lab Results   Component Value Date    Triglycerides 369 (H) 04/24/2022    Triglycerides 169 (H) 08/29/2021    Triglycerides 278 (H) 05/11/2020    Triglycerides 183 (H) 04/15/2012    HDL 33 (L) 04/24/2022    HDL 35 (L) 08/29/2021    HDL 35 05/11/2020    HDL 37 (L) 03/16/2014    HDL 38 (L) 03/17/2013    HDL 30 (L) 04/15/2012    Non-HDL Cholesterol 178 (H) 04/24/2022    Non-HDL Cholesterol 129 08/29/2021    Non-HDL Cholesterol 156 05/11/2020    LDL Calculated 104 (H) 04/24/2022    LDL Calculated 95 08/29/2021    LDL Calculated 100 (H) 05/11/2020    LDL Cholesterol, Calculated 85 04/15/2012    Creatinine 4.79 (H) 11/09/2022    Creatinine 1.33 10/06/2014    BUN 74 (H) 11/09/2022    BUN 38 (A) 08/09/2021    Potassium 3.1 (L) 11/09/2022    Potassium 4.2 10/06/2014    Magnesium 1.8 11/09/2022    Magnesium 1.5 10/06/2014    WBC 8.9 11/09/2022    WBC 8.0 07/15/2021    WBC 6.4 10/06/2014    WBC 7.1 08/31/2014    HGB 12.9 11/09/2022    HGB 15.3 10/06/2014    Hgb, blood gas 8.3 (L) 03/03/2012    HCT 37.4 (L) 11/09/2022    HCT 45.0 10/06/2014    Platelet 267 11/09/2022    Platelet 179 10/06/2014    INR 1.05 07/26/2022 INR 0.91 04/24/2022    INR 1.0 05/23/2012    INR 1.0 05/22/2012       Lab Results   Component Value Date    A1C 8.3 (H) 08/04/2022     No results found for: HSCRP  No results found for: HOMOCYSTEINE    Pertinent Test Results:  ECG: 5.20.24 a fib present since Jan 2024, RBBB, left axis    Imaging: echo 3.6.23  Summary    1. Technically difficult study due to chest wall/lung interference.    2. An ultrasound enhancing agent was used to improve the visualization of  the left ventricular cavity and endocardial borders.    3. Concentric left ventricular hypertrophy with overall normal systolic  function (EF 55-60%), with grade 2 diastolic dysfunction and segmental  hypokinesis of the mid-inferior wall.    4. The right ventricle is mildly to moderately dilated in size, with normal  systolic function.  5. There are no significant structural valvular abnormalities.    6. Mild to moderate biatrial enlargement.        Left Ventricle    The left ventricle is normal in size with moderately increased wall  thickness.    The left ventricular systolic function is normal, LVEF is visually estimated  at 55-60%.    There is grade II diastolic dysfunction (elevated filling pressure).    The mid-inferior wall is relatively hypokinetic.     Right Ventricle    The right ventricle is mildly to moderately dilated in size, with normal  systolic function.

## 2022-11-13 NOTE — Unmapped (Signed)
Pt had low potassium, was going to order supplement- noted that Dr Romelle Starcher wrote Rx, no new RX was sent by this provider    Richard Lasso, FNP  11/12/2022

## 2022-11-13 NOTE — Unmapped (Addendum)
Your doctors have asked [place] to determine if you are at increased risk for heart problems during the proposed surgery.  Your heart failure, atrial fibrillation and kidney problems increase your risk of having complications during and after surgery.      Please talk with your primary care doctor about the need for anticoagulation for your atrial fibrillation.

## 2022-11-14 NOTE — Unmapped (Signed)
Patient's 5/16 Tac result was not quantifiable, <1. ALP and GGT elevated as well. Called patient this morning about Tac, and he reported he is taking only 1 cap twice daily. Reinforced that the rx directs him to take 2 caps=1mg  twice daily. He said he was unaware of that but would adjust his dose today and repeat labs on 5/20 and 6/3, when he returns to [facility] for appts. Re.elevated lfts, he denies any n/v/d/constipation/icterus/RUQ pain or starting any new meds. He endorsed having a recent cold with cough that has not resolved yet but no fever. Encouraged him to f/up with provider on this at his visit on Friday. He verbalized understanding of all discussed.

## 2022-11-14 NOTE — Unmapped (Signed)
Pt unsure if sleep study 5 years ago was at [facility] or [facility].. I have requested medical release of sleep study to both hospitals' medical records.  [phone] Fax:  [phone] and [phone] and [phone].

## 2022-11-15 ENCOUNTER — Encounter: Admit: 2022-11-15 | Discharge: 2022-12-05 | Payer: PRIVATE HEALTH INSURANCE

## 2022-11-15 ENCOUNTER — Ambulatory Visit: Admit: 2022-11-15 | Payer: PRIVATE HEALTH INSURANCE

## 2022-11-15 ENCOUNTER — Encounter
Admit: 2022-11-15 | Discharge: 2022-12-05 | Disposition: A | Discharge status: Home / Self Care | Payer: PRIVATE HEALTH INSURANCE | Admitting: Student in an Organized Health Care Education/Training Program

## 2022-11-15 ENCOUNTER — Ambulatory Visit
Admit: 2022-11-15 | Discharge: 2022-12-05 | Disposition: A | Discharge status: Home / Self Care | Payer: PRIVATE HEALTH INSURANCE | Admitting: Student in an Organized Health Care Education/Training Program

## 2022-11-15 ENCOUNTER — Encounter: Admit: 2022-11-15 | Payer: PRIVATE HEALTH INSURANCE

## 2022-11-15 LAB — BASIC METABOLIC PANEL
ANION GAP: 15 mmol/L — ABNORMAL HIGH (ref 5–14)
BLOOD UREA NITROGEN: 65 mg/dL — ABNORMAL HIGH (ref 9–23)
BUN / CREAT RATIO: 12
CALCIUM: 9.3 mg/dL (ref 8.7–10.4)
CHLORIDE: 98 mmol/L (ref 98–107)
CO2: 25 mmol/L (ref 20.0–31.0)
CREATININE: 5.34 mg/dL — ABNORMAL HIGH
EGFR CKD-EPI (2021) MALE: 12 mL/min/{1.73_m2} — ABNORMAL LOW (ref >=60)
GLUCOSE RANDOM: 124 mg/dL (ref 70–179)
POTASSIUM: 3 mmol/L — ABNORMAL LOW (ref 3.4–4.8)
SODIUM: 138 mmol/L (ref 135–145)

## 2022-11-15 LAB — CBC W/ AUTO DIFF
BASOPHILS ABSOLUTE COUNT: 0.1 10*9/L (ref 0.0–0.1)
BASOPHILS RELATIVE PERCENT: 0.5 %
EOSINOPHILS ABSOLUTE COUNT: 0.4 10*9/L (ref 0.0–0.5)
EOSINOPHILS RELATIVE PERCENT: 2.9 %
HEMATOCRIT: 38.9 % — ABNORMAL LOW (ref 39.0–48.0)
HEMOGLOBIN: 13.5 g/dL (ref 12.9–16.5)
LYMPHOCYTES ABSOLUTE COUNT: 1.2 10*9/L (ref 1.1–3.6)
LYMPHOCYTES RELATIVE PERCENT: 9.9 %
MEAN CORPUSCULAR HEMOGLOBIN CONC: 34.6 g/dL (ref 32.0–36.0)
MEAN CORPUSCULAR HEMOGLOBIN: 28.2 pg (ref 25.9–32.4)
MEAN CORPUSCULAR VOLUME: 81.5 fL (ref 77.6–95.7)
MEAN PLATELET VOLUME: 7.9 fL (ref 6.8–10.7)
MONOCYTES ABSOLUTE COUNT: 0.8 10*9/L (ref 0.3–0.8)
MONOCYTES RELATIVE PERCENT: 6.3 %
NEUTROPHILS ABSOLUTE COUNT: 10.1 10*9/L — ABNORMAL HIGH (ref 1.8–7.8)
NEUTROPHILS RELATIVE PERCENT: 80.4 %
PLATELET COUNT: 281 10*9/L (ref 150–450)
RED BLOOD CELL COUNT: 4.78 10*12/L (ref 4.26–5.60)
RED CELL DISTRIBUTION WIDTH: 14.3 % (ref 12.2–15.2)
WBC ADJUSTED: 12.5 10*9/L — ABNORMAL HIGH (ref 3.6–11.2)

## 2022-11-15 LAB — RED TIGER EXTRA TUBE

## 2022-11-15 LAB — SEDIMENTATION RATE: ERYTHROCYTE SEDIMENTATION RATE: 77 mm/h — ABNORMAL HIGH (ref 0–20)

## 2022-11-15 LAB — C-REACTIVE PROTEIN: C-REACTIVE PROTEIN: 27 mg/L — ABNORMAL HIGH (ref <=10.0)

## 2022-11-15 NOTE — Unmapped (Signed)
EGD  Procedure #1     Procedure #2   [id number]  MRN   GENERIC  Endoscopist     Is the patient's health insurance [employer], [place] [facility]), or [employer]?     Urgent procedure     Are you pregnant?     Are you in the process of scheduling or awaiting results of a heart ultrasound, stress test, or catheterization to evaluate new or worsening chest pain, dizziness, or shortness of breath?     Do you take: Plavix (clopidogrel), Coumadin (warfarin), Lovenox (enoxaparin), Pradaxa (dabigatran), Effient (prasugrel), Xarelto (rivaroxaban), Eliquis (apixaban), Pletal (cilostazol), or Brilinta (ticagrelor)?          Which of the above medications are you taking?          What is the name of the medical practice that manages this medication?          What is the name of the medical provider who manages this medication?     Do you have hemophilia, von Willebrand disease, or low platelets?     Do you have a pacemaker or implanted cardiac defibrillator?     Has a [place] GI provider specified the location(s)?     Which location(s) did the [facility] provider specify?        [facility]        [place]        [facility]        [facility]     Is procedure indication for variceal banding (this does NOT include variceal screening)?     Have you had a heart attack, stroke or heart stent placement within the past 6 months?     Month of event     Year of event (ONLY ENTER LAST 2 DIGITS)        5  Height (feet)   10  Height (inches)   280  Weight (pounds)   40.2  BMI          Did the ordering provider specify a bowel prep?          What bowel prep was specified?     Do you have chronic kidney disease?     Do you have chronic constipation or have you had poor quality bowel preps for past colonoscopies?     Do you have Crohn's disease or ulcerative colitis?     Have you had weight loss surgery?          When you walk around your house or grocery store, do you have to stop and rest due to shortness of breath, chest pain, or light-headedness?     Do you ever use supplemental oxygen?     Have you been hospitalized for cirrhosis of the liver or heart failure in the last 12 months?     Have you been treated for mouth or throat cancer with radiation or surgery?     Have you been told that it is difficult for doctors to insert a breathing tube in you during anesthesia?     Have you had a heart or lung transplant?          Are you on dialysis?     Do you have cirrhosis of the liver?     Do you have myasthenia gravis?     Is the patient a prisoner?          Have you been diagnosed with sleep apnea or do you wear a  CPAP machine at night?     Are you younger than 30?   TRUE  Have you previously received propofol sedation administered by an anesthesiologist for a GI procedure?     Do you drink an average of more than 3 drinks of alcohol per day?     Do you regularly take suboxone or any prescription medications for chronic pain?     Do you regularly take Ativan, Klonopin, Xanax, Valium, lorazepam, clonazepam, alprazolam, or diazepam?     Have you previously had difficulty with sedation during a GI procedure?     Have you been diagnosed with PTSD?     Are you allergic to fentanyl or midazolam (Versed)?     Do you take medications for HIV?   ################# ## ###################################################################################################################   MRN:          [id number]   Anticoag Review:  No   Nurse Triage:  No   GI Clinic Consult:  No   Procedure(s):  EGD     0   [facility]  [facility]     [facility]     [place]        Endoscopist:  GENERIC   Urgent:            No   Prep:                                 ################# ## ###################################################################################################################

## 2022-11-15 NOTE — Unmapped (Signed)
Epworth Sleepiness Scale 1.    STOP-BANG score 4  BMI 41 with chronic heart failure preserved EF and A-fib  Last sleep apnea testing in 2015, negative, will discuss with surgeon and his PCP about repeating a sleep study    Haywood Lasso, FNP  11/15/2022

## 2022-11-16 DIAGNOSIS — R5382 Chronic fatigue, unspecified: Principal | ICD-10-CM

## 2022-11-16 DIAGNOSIS — Z6841 Body Mass Index (BMI) 40.0 and over, adult: Principal | ICD-10-CM

## 2022-11-16 LAB — HEPATIC FUNCTION PANEL
ALBUMIN: 3.1 g/dL — ABNORMAL LOW (ref 3.4–5.0)
ALKALINE PHOSPHATASE: 155 U/L — ABNORMAL HIGH (ref 46–116)
ALT (SGPT): 74 U/L — ABNORMAL HIGH (ref 10–49)
AST (SGOT): 130 U/L — ABNORMAL HIGH (ref <=34)
BILIRUBIN DIRECT: 0.3 mg/dL (ref 0.00–0.30)
BILIRUBIN TOTAL: 0.9 mg/dL (ref 0.3–1.2)
PROTEIN TOTAL: 7.6 g/dL (ref 5.7–8.2)

## 2022-11-16 LAB — CBC W/ AUTO DIFF
BASOPHILS ABSOLUTE COUNT: 0.1 10*9/L (ref 0.0–0.1)
BASOPHILS RELATIVE PERCENT: 0.6 %
EOSINOPHILS ABSOLUTE COUNT: 0.3 10*9/L (ref 0.0–0.5)
EOSINOPHILS RELATIVE PERCENT: 3.2 %
HEMATOCRIT: 38.2 % — ABNORMAL LOW (ref 39.0–48.0)
HEMOGLOBIN: 13.1 g/dL (ref 12.9–16.5)
LYMPHOCYTES ABSOLUTE COUNT: 1 10*9/L — ABNORMAL LOW (ref 1.1–3.6)
LYMPHOCYTES RELATIVE PERCENT: 9.6 %
MEAN CORPUSCULAR HEMOGLOBIN CONC: 34.3 g/dL (ref 32.0–36.0)
MEAN CORPUSCULAR HEMOGLOBIN: 28.3 pg (ref 25.9–32.4)
MEAN CORPUSCULAR VOLUME: 82.5 fL (ref 77.6–95.7)
MEAN PLATELET VOLUME: 7.7 fL (ref 6.8–10.7)
MONOCYTES ABSOLUTE COUNT: 0.7 10*9/L (ref 0.3–0.8)
MONOCYTES RELATIVE PERCENT: 7.1 %
NEUTROPHILS ABSOLUTE COUNT: 8.4 10*9/L — ABNORMAL HIGH (ref 1.8–7.8)
NEUTROPHILS RELATIVE PERCENT: 79.5 %
PLATELET COUNT: 227 10*9/L (ref 150–450)
RED BLOOD CELL COUNT: 4.63 10*12/L (ref 4.26–5.60)
RED CELL DISTRIBUTION WIDTH: 14.7 % (ref 12.2–15.2)
WBC ADJUSTED: 10.6 10*9/L (ref 3.6–11.2)

## 2022-11-16 LAB — CYSTATIN C
CYSTATIN C: 4.33 mg/L — ABNORMAL HIGH (ref 0.64–1.23)
EGFR CKD-EPI (2012) CYSTATIN C MALE: 11 mL/min/{1.73_m2} — ABNORMAL LOW (ref >=60)

## 2022-11-16 LAB — URINALYSIS WITH MICROSCOPY
BACTERIA: NONE SEEN /HPF
BILIRUBIN UA: NEGATIVE
GLUCOSE UA: 1000 — AB
KETONES UA: NEGATIVE
LEUKOCYTE ESTERASE UA: NEGATIVE
NITRITE UA: NEGATIVE
PH UA: 6 (ref 5.0–9.0)
PROTEIN UA: 100 — AB
RBC UA: <1 /HPF (ref <=3)
SPECIFIC GRAVITY UA: 1.006 (ref 1.003–1.030)
SQUAMOUS EPITHELIAL: <1 /HPF (ref 0–5)
UROBILINOGEN UA: <2
WBC UA: <1 /HPF (ref <=2)

## 2022-11-16 LAB — BASIC METABOLIC PANEL
ANION GAP: 11 mmol/L (ref 5–14)
BLOOD UREA NITROGEN: 68 mg/dL — ABNORMAL HIGH (ref 9–23)
BUN / CREAT RATIO: 13
CALCIUM: 8.6 mg/dL — ABNORMAL LOW (ref 8.7–10.4)
CHLORIDE: 100 mmol/L (ref 98–107)
CO2: 24 mmol/L (ref 20.0–31.0)
CREATININE: 5.38 mg/dL — ABNORMAL HIGH
EGFR CKD-EPI (2021) MALE: 12 mL/min/{1.73_m2} — ABNORMAL LOW (ref >=60)
GLUCOSE RANDOM: 353 mg/dL — ABNORMAL HIGH (ref 70–179)
POTASSIUM: 3.5 mmol/L (ref 3.4–4.8)
SODIUM: 135 mmol/L (ref 135–145)

## 2022-11-16 LAB — MAGNESIUM: MAGNESIUM: 1.5 mg/dL — ABNORMAL LOW (ref 1.6–2.6)

## 2022-11-16 LAB — LACTATE, VENOUS, WHOLE BLOOD
LACTATE BLOOD VENOUS: 2.5 mmol/L — ABNORMAL HIGH (ref 0.5–1.8)
LACTATE BLOOD VENOUS: 3.4 mmol/L — ABNORMAL HIGH (ref 0.5–1.8)
LACTATE BLOOD VENOUS: 3.2 mmol/L — ABNORMAL HIGH (ref 0.5–1.8)

## 2022-11-16 LAB — PROTEIN / CREATININE RATIO, URINE
CREATININE, URINE: 30.3 mg/dL
PROTEIN URINE: 114.9 mg/dL
PROTEIN/CREAT RATIO, URINE: 3.792

## 2022-11-16 LAB — B-TYPE NATRIURETIC PEPTIDE: B-TYPE NATRIURETIC PEPTIDE: 603 pg/mL — ABNORMAL HIGH (ref <=100)

## 2022-11-16 LAB — ACETAMINOPHEN LEVEL: ACETAMINOPHEN LEVEL: 4.2 ug/mL (ref <=20.0)

## 2022-11-16 LAB — PHOSPHORUS: PHOSPHORUS: 6.1 mg/dL — ABNORMAL HIGH (ref 2.4–5.1)

## 2022-11-16 MED ORDER — CEPHALEXIN 500 MG CAPSULE
ORAL_CAPSULE | Freq: Four times a day (QID) | ORAL | 0 refills | 7 days | Status: CP
Start: 2022-11-16 — End: 2022-11-23

## 2022-11-16 MED ORDER — SULFAMETHOXAZOLE 800 MG-TRIMETHOPRIM 160 MG TABLET
ORAL_TABLET | Freq: Two times a day (BID) | ORAL | 0 refills | 7 days | Status: CP
Start: 2022-11-16 — End: 2022-11-23

## 2022-11-16 MED ADMIN — morphine 4 mg/mL injection 4 mg: 4 mg | INTRAVENOUS | @ 16:00:00 | Stop: 2022-11-16

## 2022-11-16 MED ADMIN — spironolactone (ALDACTONE) tablet 25 mg: 25 mg | ORAL | @ 12:00:00

## 2022-11-16 MED ADMIN — famotidine (PEPCID) tablet 20 mg: 20 mg | ORAL | @ 11:00:00 | Stop: 2022-11-16

## 2022-11-16 MED ADMIN — morphine 4 mg/mL injection 4 mg: 4 mg | INTRAVENOUS | @ 03:00:00 | Stop: 2022-11-15

## 2022-11-16 MED ADMIN — insulin lispro (HumaLOG) injection 0-20 Units: 0-20 [IU] | SUBCUTANEOUS | @ 21:00:00

## 2022-11-16 MED ADMIN — clindamycin (CLEOCIN) 600 mg/50 mL IVPB 600 mg: 600 mg | INTRAVENOUS | @ 04:00:00

## 2022-11-16 MED ADMIN — vancomycin (VANCOCIN) 2,500 mg in sodium chloride (NS) 0.9 % 500 mL IVPB: 2500 mg | INTRAVENOUS | @ 04:00:00 | Stop: 2022-11-16

## 2022-11-16 MED ADMIN — diclofenac sodium (VOLTAREN) 1 % gel 2 g: 2 g | TOPICAL | @ 21:00:00

## 2022-11-16 MED ADMIN — mycophenolate (CELLCEPT) capsule 250 mg: 250 mg | ORAL | @ 04:00:00

## 2022-11-16 MED ADMIN — heparin (porcine) 5,000 unit/mL injection 7,500 Units: 7500 [IU] | SUBCUTANEOUS | @ 21:00:00

## 2022-11-16 MED ADMIN — carvedilol (COREG) tablet 3.125 mg: 3.125 mg | ORAL | @ 14:00:00

## 2022-11-16 MED ADMIN — morphine 4 mg/mL injection 4 mg: 4 mg | INTRAVENOUS | @ 12:00:00 | Stop: 2022-11-16

## 2022-11-16 MED ADMIN — diclofenac sodium (VOLTAREN) 1 % gel 2 g: 2 g | TOPICAL | @ 16:00:00

## 2022-11-16 MED ADMIN — acetaminophen (TYLENOL) tablet 650 mg: 650 mg | ORAL | @ 02:00:00 | Stop: 2022-11-15

## 2022-11-16 MED ADMIN — aluminum-magnesium hydroxide-simethicone (MAALOX MAX) 80-80-8 mg/mL oral suspension: 30 mL | ORAL | @ 14:00:00 | Stop: 2022-11-16

## 2022-11-16 MED ADMIN — pantoprazole (Protonix) EC tablet 40 mg: 40 mg | ORAL | @ 12:00:00

## 2022-11-16 MED ADMIN — insulin lispro (HumaLOG) injection 20 Units: 20 [IU] | SUBCUTANEOUS | @ 19:00:00 | Stop: 2022-11-16

## 2022-11-16 MED ADMIN — acetaminophen (TYLENOL) tablet 1,000 mg: 1000 mg | ORAL | @ 19:00:00

## 2022-11-16 MED ADMIN — insulin lispro (HumaLOG) injection 0-20 Units: 0-20 [IU] | SUBCUTANEOUS | @ 14:00:00

## 2022-11-16 MED ADMIN — insulin lispro (HumaLOG) injection 0-20 Units: 0-20 [IU] | SUBCUTANEOUS | @ 16:00:00

## 2022-11-16 MED ADMIN — HYDROmorphone (PF) (DILAUDID) injection 1 mg: 1 mg | INTRAVENOUS | @ 05:00:00 | Stop: 2022-11-16

## 2022-11-16 MED ADMIN — furosemide (LASIX) injection 80 mg: 80 mg | INTRAVENOUS | @ 11:00:00 | Stop: 2022-11-16

## 2022-11-16 MED ADMIN — HYDROmorphone (PF) injection Syrg 0.5 mg: .5 mg | INTRAVENOUS | @ 21:00:00 | Stop: 2022-11-30

## 2022-11-16 MED ADMIN — morphine 4 mg/mL injection 4 mg: 4 mg | INTRAVENOUS | @ 09:00:00 | Stop: 2022-11-16

## 2022-11-16 MED ADMIN — lactated ringers bolus 1,000 mL: 1000 mL | INTRAVENOUS | @ 07:00:00 | Stop: 2022-11-16

## 2022-11-16 MED ADMIN — furosemide (LASIX) injection 40 mg: 40 mg | INTRAVENOUS | @ 04:00:00 | Stop: 2022-11-15

## 2022-11-16 MED ADMIN — amlodipine (NORVASC) tablet 5 mg: 5 mg | ORAL | @ 14:00:00

## 2022-11-16 MED ADMIN — magnesium sulfate 2gm/50mL IVPB: 2 g | INTRAVENOUS | @ 21:00:00 | Stop: 2022-11-16

## 2022-11-16 MED ADMIN — tacrolimus (PROGRAF) capsule 1 mg: 1 mg | ORAL | @ 12:00:00

## 2022-11-16 MED ADMIN — sucralfate (CARAFATE) oral suspension: 1 g | ORAL | @ 14:00:00 | Stop: 2022-11-16

## 2022-11-16 MED ADMIN — mycophenolate (CELLCEPT) capsule 250 mg: 250 mg | ORAL | @ 12:00:00

## 2022-11-16 MED ADMIN — oxyCODONE (ROXICODONE) immediate release tablet 5 mg: 5 mg | ORAL | @ 19:00:00 | Stop: 2022-11-16

## 2022-11-16 MED ADMIN — potassium chloride ER tablet 40 mEq: 40 meq | ORAL | @ 04:00:00 | Stop: 2022-11-15

## 2022-11-16 MED ADMIN — diclofenac sodium (VOLTAREN) 1 % gel 2 g: 2 g | TOPICAL | @ 11:00:00

## 2022-11-16 NOTE — Unmapped (Signed)
Pt here with right leg pain and swelling that started this am, pt reports redness and pain. Denies fevers.

## 2022-11-16 NOTE — Unmapped (Signed)
Vancomycin Therapeutic Monitoring Pharmacy Note    Richard [name] is a 57 y.o. male starting vancomycin. Date of therapy initiation: 11/15/22    Indication: Skin and Soft Tissue Infection (SSTI) - Moderate/Severe    Prior Dosing Information: None/new initiation     Goals:  Therapeutic Drug Levels  Vancomycin trough goal: 10-20 mg/L    Additional Clinical Monitoring/Outcomes  Renal function, volume status (intake and output)    Results: Not applicable    Wt Readings from Last 1 Encounters:   11/15/22 (!) 131.5 kg (290 lb)     Creatinine   Date Value Ref Range Status   11/15/2022 5.34 (H) 0.73 - 1.18 mg/dL Final   95/62/1308 6.57 (H) 0.73 - 1.18 mg/dL Final   84/69/6295 2.84 (H) 0.73 - 1.18 mg/dL Final        Pharmacokinetic Considerations and Significant Drug Interactions:  Per linear dose adjustment  Concurrent nephrotoxic meds: not applicable    Assessment/Plan:  Recommendation(s)  Start vancomycin 2500 mg IV x1 with plans to re-dose based on levels  Estimated trough on recommended regimen: Not applicable - dosing by level    Follow-up  Level due:  11/17/22 with am labs  A pharmacist will continue to monitor and order levels as appropriate    Please page service pharmacist with questions/clarifications.    Meridee Score, PharmD

## 2022-11-16 NOTE — Unmapped (Signed)
Internal Medicine (MEDU) History & Physical    Assessment & Plan:   Richard [name] is a 57 y.o. male whose presentation is complicated by CKD4 (bs Cr 4-4.7), type 2 diabetes with chronic insulin use, HFpEF, HTN, persistent A-fib not on A/C, CAD, OSA not currently on CPAP, and NASH cirrhosis s/p orthototic liver txp in 2013 that presented to [facility] with right leg pain and swelling concerning for cellulitis.     Principal Problem:    Cellulitis of right lower extremity  Active Problems:    Liver replaced by transplant (CMS-HCC)    Immunosuppression due to drug therapy (CMS-HCC)    CKD (chronic kidney disease) stage 4, GFR 15-29 ml/min (CMS-HCC)    Severe hyperglycemia due to diabetes mellitus (CMS-HCC)    Acute kidney injury superimposed on chronic kidney disease (CMS-HCC)    CAD (coronary artery disease)    Hypomagnesemia    Elevated liver enzymes  Resolved Problems:    * No resolved hospital problems. *        Active Problems    # Right leg pain  Painful erythematous swelling first noted when pt woke up Wednesday morning. He thought it would just go away with activities, but as pain worsened through the day, pt decided to come to the ED. Pt has had 3-4 episodes of exact same symptom at the exact same site (right calf medially) as cellulitis. He reports that the last episode was many years ago, and it was always well managed on abx and pain meds at the ED. Pt explains it as throbbing pain. Exam reveals warm, erythematous local swelling on his right leg that's very tender to palpation. No notable swelling of the leg compared to left side. 1+ pitting edema possibly explained with CKD4. Duplex negative for DVT. XR of tibia/fibula with no abnormalities. CT of LE showed soft tissue edema without gas or fluid collection. CRP elevated at 27 and sedimentation rate elevated at 77. Blood culture in process. Pt reports that he had chills yesterday but no fever. He also endorses headache with pain but did not have neurological symptoms, fatigue, or weakness. He did not attempt to go hard on home Tylenol given his liver condition, and cannot take NSAIDs due to CKD. He states Dilaudid worked the best for pain ctrl. No response to Tylenol, voltaren, oxycodone, or morphine. Currently the leading dx is recurrent cellulitis.   - Continue Vancomycin (per pharmacy; last dose was 2500 mg) 5/23 - , anticipate total 5-7 course with transition to PO abx  - For pain ctrl, tylenol 650 mg PO q6h currently held iso LFT elevation will resume if normal/low as unlikely to have tylenol tox, oxycodone 5 mg PO q6h for moderate pain and IV hydromorphone 0.5 mg q6h PRN for severe pain    # Type 2 diabetes with severe hyperglycemia   Pt's BG elevated 147-->432 overnight. Pt takes Levemir 20 mg at bedtime and Novolog 55u with meals (no SSI) but missed dose yesterday. Most likely explained with missing dose and elevation from pain/immune response. Of note, pt states his BG was very well controlled with he was on Ozempic 1 mg. However, he had to discontinue due to severe GI side effects that did not wean. Pt uses a CGM to monitor his level.   - Lantus 20u at bedtime, Humalog 30u TID with meals.    # S/p Liver transplant in 2013 - Elevated liver enzymes  Hx of liver transplant 11 years ago 2/2 NASH. Pt is on  Cellcept and Tacrolimus. Pt's hepatic panel had elevated AST (130), ALT (74), alkphos (155), and depressed albumin (3.1). Pt reports he is aware of his condition and does not use tylenol too much when he's in pain. He denies alcohol use. Will hold cellcept for now and consult hepatology for co-management. Pt was also given acetaminophen 1000 mg one time earlier today, which we will hold as well.  Differential for elevated liver enzymes includes drug-induced liver injury versus less like ischemic liver injury (not particularly hypotensive recently) versus transplant rejection.   - HOLD cellcept 250 mg iso infection, tylenol 650 mg (pending tylenol level will resume)  - Continue tacrolimus 1 mg  - Consult hepatology  - Repeat hepatic panel and PT-INR in the morning  - Liver Doppler US       #AKI on CKD4  Currently following kidney transplant center for evaluation. Per last visit note on 4/2, likely to happen after weight loss which is also followed by bariatric surgery team. Pt's BUN is elevated (68 vs baseline of 30-40's) and creatinine also elevated (5.38 vs baseline of low-mid 4's). Currently without signs/sx of uremia but with he is approaching CKDV.   - Monitor intake and output  - Nephrology consultation in AM  - Daily BMP  - Avoid nephrotoxic meds    Hypomagnesemia  Repleted to goal Mg 2.    Chronic Problems    # HF, HTN, hx of A-fib and CAD  Pt follows cardiology closely. Home med: amlodipine 5 mg, lasix 40 mg, spironolactone 25 mg, and carvedilol 6.25 mg. Pt endorses good control but had elevated BP (highest SBP=187) due to pain. Rate controlled in ED. appears fairly well compensated from a heart failure perspective though with bilateral pitting edema on examination likely from both HFpEF and CKD.   - Continue Amlodipine 5 mg daily, Carvedilol 3.125 mg BID  - HOLD home lasix iso AKI  - HOLD spironolactone iso AKI      The patient's presentation is complicated by the following clinically significant conditions requiring additional evaluation and treatment: - Chronic kidney disease POA requiring further investigation, treatment, or monitoring          Issues Impacting Complexity of Management:  -Intensive monitoring of drug toxicity from Vancomycin with scheduled BMP and/or Vancomycin levels      Medical Decision Making: Discussed the patient's management and/or test interpretation with Gastroenterology as summarized within this note      Checklist:  Diet: Regular Diet  DVT PPx: Heparin 5000units q8h  Code Status: Full Code  Dispo: Patient appropriate for  ??Inpatient based on expectation of ongoing need for hospitalization greater than two midnights and severity of presentation/services including monitoring of AKI with specialty consultation, liver transplant patient with elevated liver enzymes needing workup    Team Contact Information:   Primary Team: Internal Medicine (MEDU)  Primary Resident: Bo Mcclintock  Resident's Pager: [phone] (Gen MedU Intern - Alvester Morin)    Chief Concern:   Cellulitis of right lower extremity      Subjective:   Richard [name] is a 57 y.o. male with pertinent PMHx of CKD4, type 2 diabetes, HFpEF, HTN, A-fib, CAD, OSA, and liver transplant presenting with right leg pain and swelling.           History obtained by Thereasa Parkin from patient (obtained by MS3 [name]).       HPI:   Pt first noticed erythematous swelling on 5/22 morning when he woke up. He came to the ED  in 5/22 afternoon when the pain worsened to 9/10 level. He had the same episodes 4 times many years ago w/ dx of cellulitis, which prompted him to the ED early this time.  He describes the pain as constant and throbbing.  He also reports chills but no recent fevers. And a headache. Denies N/V/SOB.     In the ED:  Vitals: He was afebrile, with a heart rate in the 70s to 80s, hypertensive blood pressure 140s to 180s systolics and satting well on room air.  Labs: Notable for neutrophil predominance with a WBC count of 10.6 and neutrophil percentage of 79.5%.  Creatinine of 5.38 and BUN of 68 with low mag of 1.5, high phosphorus of 6.1.  AST/ALT of 130/74.  Alkaline phosphatase of 155.  Cultures: bcx x2 pending  EKG: afib, RBBB  Imaging: CT right lower extremity soft tissue edema, no gas or fluid collection   ED Interventions: 1L NS, several home meds resumed, 1 dose of vancomycin     Pertinent Surgical Hx  Orthotopic liver transplantation in 2013  Cholecystectomy    Pertinent Family Hx  Family History   Problem Relation Age of Onset    Diabetes Father     Liver disease Maternal Uncle     Diabetes Paternal Grandfather     Melanoma Neg Hx     Basal cell carcinoma Neg Hx     Squamous cell carcinoma Neg Hx     Kidney disease Neg Hx          Pertinent Social Hx   He currently lives with his partner and is independent in his ADLs/IADLS at baseline.  Ambulates without assistive devices and is originally from [place].  Denies current alcohol use, tobacco use or other substance use.    Allergies  Patient has no known allergies.    I reviewed the Medication List. Updated after discussing with pt.  Prior to Admission medications    Medication Dose, Route, Frequency   amlodipine (NORVASC) 5 MG tablet 5 mg, Oral, Daily (standard)   atorvastatin (LIPITOR) 40 MG tablet 40 mg, Oral, Nightly, Med to prevent heart attacks and help cholesterol   blood-glucose meter,continuous (DEXCOM G6 RECEIVER) Misc 1 each, Miscellaneous, Daily, Dispense 1 receiver annually.  Sent to [facility]   blood-glucose sensor (DEXCOM G6 SENSOR) Devi Use as directed to monitor blood glucose   blood-glucose sensor (DEXCOM G6 SENSOR) Devi Use to check blood glucose and change every 10 days.   blood-glucose transmitter (DEXCOM G6 TRANSMITTER) Devi 1 each, Miscellaneous, Every 3 months, [facility] pharmacy   blood-glucose transmitter (DEXCOM G6 TRANSMITTER) Devi Use as directed to monitor blood glucose   carvedilol (COREG) 6.25 MG tablet 6.25 mg, Oral, 2 times a day (standard)   cephalexin (KEFLEX) 500 MG capsule 500 mg, Oral, 4 times a day   CHOLECALCIFEROL, VITAMIN D3, (VITAMIN D3 ORAL) 2,000 Units, Daily (standard)   furosemide (LASIX) 40 MG tablet 40 mg, Oral, Daily (standard)   insulin aspart (NOVOLOG FLEXPEN) 100 unit/mL (3 mL) injection pen 40 Units, Subcutaneous, 3 times a day (AC)   insulin detemir U-100 (LEVEMIR) 100 unit/mL (3 mL) injection pen 20 Units, Subcutaneous, Nightly   magnesium oxide (MAG-OX) 400 mg (241.3 mg elemental magnesium) tablet 400 mg, Oral, Daily (standard)   multivitamin (TAB-A-VITE/THERAGRAN) per tablet 1 tablet, Oral, Daily (standard)   mycophenolate (CELLCEPT) 250 mg capsule 250 mg, Oral, 2 times a day (standard)   pen needle, diabetic 32 gauge x 5/32 (4 mm) Ndle Use with insulin  up to 4 times a day as needed.  Patient not taking: Reported on 11/13/2022   sildenafil (VIAGRA) 50 MG tablet 50 mg, Oral, Daily PRN   spironolactone (ALDACTONE) 25 MG tablet 25 mg, Oral, Daily (standard)   sulfamethoxazole-trimethoprim (BACTRIM DS) 800-160 mg per tablet 1 tablet, Oral, 2 times a day (standard)   tacrolimus (PROGRAF) 0.5 MG capsule 1 mg, Oral, 2 times a day       Librarian, academic:  Mr. Charlebois currently has decisional capacity for healthcare decision-making and is able to designate a surrogate healthcare decision maker. Mr. Scherff designated healthcare decision maker(s) is/are his sister, Misty Stanley as denoted by stated patient preference.    Objective:   Physical Exam:  Temp:  [36.7 ??C (98.1 ??F)-37.1 ??C (98.8 ??F)] 36.7 ??C (98.1 ??F)  Heart Rate:  [76-88] 88  SpO2 Pulse:  [92] 92  Resp:  [18-22] 20  BP: (132-187)/(72-116) 185/111  SpO2:  [96 %-99 %] 96 %    Gen: NAD, converses normally.  Eyes: Sclera anicteric, EOMI grossly normal.   HENT: Atraumatic, normocephalic.  Neck: Trachea midline.  Heart: Irregularly irregular. No murmur/thrills/gallops. (Of note, pt reports childhood murmur.)  Lungs: CTAB, no crackles or wheezes.  Extremities: Warm, erythematous, tender, local swelling of about 10 cm in diameter in the medial side of R lower leg. 2+ pitting edema below knees bilaterally. Visually symmetric leg circumference.  Neuro: A&Ox3. Grossly symmetric, non-focal. Intact motor/sensation of legs.  Skin:  No rashes, lesions on clothed exam except R lower leg.  Psych: Alert, oriented.      [name], MS3    I attest that I have reviewed the medical student note and that the components of the history of the present illness, the physical exam, and the assessment and plan documented were performed by me or were performed in my presence by the student where I verified the documentation and performed (or re-performed) the exam and medical decision making. Barnett Abu, MD

## 2022-11-16 NOTE — Unmapped (Addendum)
Richard [name] is a 57 y.o. male with PMH NASH cirrhosis s/p OLT 2013, HFpEF, CKD5, persistent A-fib not on AC, CAD, HTN, OSA not on CPAP who presented to [facility] 5/22 with RLE pain/redness and found to have cellulitis, an AKI on CKD and elevated LFTs.  He was evaluated by hepatology, nephrology and now completed tx for cellulitis and has started on HD given ESRD and will dc home with plan for outpatient nephrology, PCP and hepatology follow up.   Below are more details of his stay at [facility] according to problem:      AKI with underlying CKD G5A3 now with progressed to ESRD requiring HD  Baseline Cr 4.6-4.8. Follows with Nephrology (Dr. Galen Manila) and Transplant Nephrology (Dr. Margaretmary Bayley). 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 Cr elevated on admission at 5.33, continued to rise (7.46 5/27), but UOP remained appopriate.  Nephrology followed and patient appears euvolemic, urine with granular casts, and Cr not improved.  Diuretics held until he had episode of SOB with mild hypoxia and xray with pulm edema.  Torsemide started 5/28 and renal US showed no hydro.  Nephrology recs to get vein mapping and patient continued to worsening with fluid status and then had mild confusion indicating likely uremia.  Nephrology felt that he needed HD and non tunneled line placed and HD started.  Now improving with mental and fluid status.  Fistula placed at HSB during stay and tunneled line placed prior to dc.  Will now transition to outpatient HD with chair established by CM team.  Will use phos binder and will also need 2nd stage of fistula creation and general surgery at [facility] will help arrange follow up.       Elevated LFTs, improved - Hx of Liver Transplant in 2013  Hx of liver transplant 11 years ago 2/2 NASH. On Cellcept and Tacrolimus. LFTs elevated on admission and some concern for reject.  Hepatology followed and LFTs improved with diuresis and then HD making congestive hepatopathy most likely cause.  Continued on tac and cellcept.  Can restart statin.     RLE Cellulitis, improved  Patient with multiple episodes of cellulitis, presented with right lower extremity cellulitis.  Patient initially doing well but fevered with transition from vancomycin to doxycycline, broadened to linezolid and cefepime to provide MRSA and pseudomonal coverage.  Patient remains afebrile and hemodynamically stable.  Blood cultures no growth.  Has now completed antibiotics for 7-day course.     Uncontrolled T2DM  Latest A1c 8.3% on 08/04/2022. Pt uses a CGM and endorses well controlled BG with Levemir and Novolog at home.    Was continued on lantus and humalog with meals and adjusted based on BG.  Now is stable and improved and will plan to dc with 18U of basal and 35U bolus insulin with meals.  PCP for ongoing management and have encourage AC/HS monitoring at home and close follow up.      Chronic HFpEF - Hx of A-fib, CAD  Remains rate controlled.  Given diuresis as above and now on HD.  Have restarted coreg but will hold aldactone and lasix in setting of ESRD requiring HD.      Hypokalemia, stable - Hypomagnesemia resolved  Noted to have low potassium and magnesium levels during stay.  These were monitored and repleted as needed.  Now improved.      Chronic Conditions  Hypertension: Continue amlodipine 10 mg, Coreg 6.25 mg twice daily

## 2022-11-16 NOTE — Unmapped (Signed)
Tacrolimus Therapeutic Monitoring Pharmacy Note    Richard [name] is a 57 y.o. male continuing tacrolimus.     Indication: Liver transplant     Date of Transplant:  2013       Prior Dosing Information: Current regimen 1 mg BID      Source(s) of information used to determine prior to admission dosing: Home Medication List or Clinic Note    Goals:  Therapeutic Drug Levels  Tacrolimus trough goal:  2-4 ng/mL    Additional Clinical Monitoring/Outcomes  Monitor renal function (SCr and urine output) and liver function (LFTs)  Monitor for signs/symptoms of adverse events (e.g., hyperglycemia, hyperkalemia, hypomagnesemia, hypertension, headache, tremor)    Results:   Tacrolimus level:  N/A    Pharmacokinetic Considerations and Significant Drug Interactions:  Concurrent hepatotoxic medications: None identified  Concurrent CYP3A4 substrates/inhibitors: None identified  Concurrent nephrotoxic medications: None identified    Assessment/Plan:  Recommendedation(s)  Continue current regimen of 1 mg BID    Follow-up  Tomorrow 11/17/22 0700 .   A pharmacist will continue to monitor and recommend levels as appropriate    Please page service pharmacist with questions/clarifications.    Teresita Madura, [facility]

## 2022-11-16 NOTE — Unmapped (Signed)
Pt here with R leg pain/swelling that has increased in pain and redness. Pt denies known fevers

## 2022-11-16 NOTE — Unmapped (Signed)
ED Progress Note    Received sign out from previous provider.    Patient Summary: RAYHAN ABRAHAMS is a 57 y.o. male PMH HFpEF, A-fib, hypertension, CAD, OSA, diabetes, CKD4 who presents for evaluation of right leg pain redness and swelling, consistent for cellulitis.   Action List:   Patient dosed with IV Lasix 80 mg, recheck postvoid residual  Repeat lactate  Admit for IV antibiotics    Updates  ED Course as of 11/16/22 1445   Thu Nov 16, 2022   0906 Patient reports peeing frequently.  Requests pain medication and insulin.  Patient's right lower extremity with no spread and erythema, dorsalis pedis 2+, warm, tender to touch.   1610 Lactate, Venous(!): 3.2   0922 Put in sliding scale insulin and diet ordered.   0924 BNP(!): 603   0934 Blood pressure 187/106.  Started home antihypertensive medicines including amlodipine 5 mg daily, carvedilol 6.25 mg twice daily   0953 GI cocktail and Pepcid for acid reflux   1015 Post-void residual volume 0   1444 Patient's blood sugar still in the 400s.  Started home insulin regimen including 40 units TID of lispro and 20 units of glargine nightly   1444 Patient still reporting pain, put in one-time p.o. oxycodone.   1445 Patient still pending admission, will sign out to oncoming team.

## 2022-11-16 NOTE — Unmapped (Shared)
[facility] Emergency Department Provider Note     ED Clinical Impression     Final diagnoses:   Cellulitis of right lower extremity (Primary)       ED Course, Assessment and Plan     Initial Clinical Impression:    Nov 15, 2022 10:05 PM     BP 162/103  - Pulse 78  - Temp 37.1 ??C (98.8 ??F) (Oral)  - Resp 18  - Wt (!) 131.5 kg (290 lb)  - SpO2 99%  - BMI 41.61 kg/m??     57 y.o. male Pertinent PMH HFpEF, A-fib, hypertension, CAD, OSA, diabetes, CKD4 who presents for evaluation of right leg pain redness and swelling that started this morning.    Upon arrival patient is afebrile and hemodynamically stable.    Ddx/MDM: Initial lab work and imaging ordered in triage.  Per my interpretation consistent with radiology x-ray of the right tibia and fibula without obvious fracture but with soft tissue edema.  Right lower extremity PVL negative for DVT patient's lab work reveals a creatinine of 5.34 consistent with his CKD, potassium 3.0 hypokalemia, mild leukocytosis with a white count of 12.5 and elevated ESR at 77    At this time my differential for patient's symptoms includes fracture or traumatic injury, cellulitis, erysipelas, DVT.  Also assess progression of redness and swelling and ensure patient does not have a serious medical illness such as necrotizing fasciitis.  Patient is nondiabetic will also assess admission the patient does not have ulcers or another etiology of patient's symptoms.  Further workup and management as below.    On exam patient has severe pain out of proportion for expected right lower extremity cellulitis.  Ultrasound with no obvious abscess or fluid pocket.  Given patient's significant pain we will obtain a CT of his right lower extremity to assess for possible deep space infection or necrotizing fasciitis.  Patient is also high risk for decompensation so recommend admission for further workup and management of cellulitis if CT is negative.    ED Course:      ED Course as of 11/16/22 0552   Wed Nov 15, 2022   2232 Sed Rate(!): 77   2232 CRP(!): 27.0   2237 Home meds ordered   2240 Vanc dosed for renal impairment   Thu Nov 16, 2022   0124 CT RLE neg for gas. Consistent with cellulitis. Although pt has requested pain medication multiple times he has asked for food and has otherwise been witnessed seated on edge of exam bed on his phone in no acute pain or distress. Will discuss discharge with PO abx with patient     Patient continuing to persistently request IV pain medication however in the meantime he is resting comfortably in no acute distress.    Diagnostic and treatment orders as below.     Orders Placed This Encounter   Procedures    Blood Culture #1    Blood Culture #2    XR Tibia Fibula Right    CT Lower Extremity RIGHT WO contrast    CBC w/ Differential    Basic Metabolic Panel    ED Extra Tubes    Sedimentation rate, manual    C-reactive protein    CBC w/ Differential    Basic metabolic panel    B-type Natriuretic Peptide    Lactate, Venous, Whole Blood    Lactate, Venous, Whole Blood    Measure post void residual    Inpatient consult to Pharmacy RX to dose:  vancomycin    Insert peripheral IV       Medications   spironolactone (ALDACTONE) tablet 25 mg (has no administration in time range)   tacrolimus (PROGRAF) capsule 1 mg (has no administration in time range)   mycophenolate (CELLCEPT) capsule 250 mg (250 mg Oral Given 11/15/22 2342)   furosemide (LASIX) tablet 40 mg (has no administration in time range)   Vancomycin - Pharmacy dosing by levels (has no administration in time range)   oxyCODONE (ROXICODONE) immediate release tablet 5 mg (5 mg Oral Refused 11/16/22 0456)   acetaminophen (TYLENOL) tablet 650 mg (650 mg Oral Given 11/15/22 2130)   morphine 4 mg/mL injection 4 mg (4 mg Intravenous Given 11/15/22 2249)   clindamycin (CLEOCIN) 600 mg/50 mL IVPB 600 mg (0 mg Intravenous Stopped 11/16/22 0010)   potassium chloride ER tablet 40 mEq (40 mEq Oral Given 11/15/22 2343)   furosemide (LASIX) injection 40 mg (40 mg Intravenous Given 11/15/22 2332)   vancomycin (VANCOCIN) 2,500 mg in sodium chloride (NS) 0.9 % 500 mL IVPB (0 mg Intravenous Stopped 11/16/22 0300)   morphine 4 mg/mL injection 4 mg (4 mg Intravenous Given 11/15/22 2325)   HYDROmorphone (PF) (DILAUDID) injection 1 mg (1 mg Intravenous Given 11/16/22 0035)   lactated ringers bolus 1,000 mL (0 mL Intravenous Stopped 11/16/22 0440)   morphine 4 mg/mL injection 4 mg (4 mg Intravenous Given 11/16/22 0448)         Independent Interpretation of Studies: I have independently interpreted the following studies:  CT without obvious gas or fracture    Discussion of Management With Other Providers or Support Staff: I discussed the management of this patient with the:  None    Considerations Regarding Additional Studies/Disposition/Escalation of Care and Critical Care:  Admission considered however not required as patient's clinical status improved prior to discharge.    _____________________________________________________________________    The case was discussed with attending physician who is in agreement with the above assessment and plan    Additional Medical Decision Making     CHIEF COMPLAINT:   Chief Complaint   Patient presents with    Leg Swelling       HPI: Richard [name] is a 57 y.o. male Pertinent PMH HFpEF, A-fib, hypertension, CAD, OSA, diabetes, CKD4 who presents for evaluation of right leg pain redness and swelling that started this morning.    Patient reports that he woke up this morning with right leg pain redness and swelling.  He has had some chills but no known fevers.  His leg was previously normal yesterday.  He has some difficulty bearing weight due to the pain but can move his leg.  He has been taking his home medications as prescribed.  He says that he has had sepsis before which is why he is scared.    Outside Historian(s): I have obtained additional history/collateral from none.    External Records Reviewed: I have reviewed 11/13/2022 [facility] cardiology progress note which reveals patient's history of A-fib and chronic heart failure.    Physical Exam     VITAL SIGNS:    BP 162/103  - Pulse 78  - Temp 37.1 ??C (98.8 ??F) (Oral)  - Resp 18  - Wt (!) 131.5 kg (290 lb)  - SpO2 99%  - BMI 41.61 kg/m??     Const: Alert and oriented. Well appearing NAD  Eyes: Conjunctivae are normal. Sclera is non-icteric, PERRL  HENT: NCAT, MMM, oropharynx clear  Neck: Supple. No stridor. Trachea is  midline  Heme: No cervical LAD.   CV: RRR, normal S1, S2, no murmurs. Extremities are warm and well perfused.  2+ pitting edema to the mid shins bilaterally  Resp: Normal respiratory effort. Breath Sounds CTAB, no wheezes, rhonchi, or crackles.  GI: Soft, NTND. No rebound or guarding.  MSK: Left leg normal.  Right lower extremity intact range of motion with area of erythema and severe tenderness palpation.  See image in chart  Neuro: Normal speech and language. No gross FNDs are appreciated.  Skin: Skin is warm, dry and intact. No rash noted.  Psych: Mood and affect are normal. Judgement and insight are at appropriate baseline.    History     PAST MEDICAL HISTORY/PAST SURGICAL HISTORY:   Past Medical History:   Diagnosis Date    COVID-19 07/18/2019    Family history of malignant neoplasm of prostate 06/23/2015    Hepatic cirrhosis (CMS-HCC) 06/23/2015    Overview:  Secondary to NASH; followed by Dr. Yevonne Pax, GI.  Last Assessment & Plan:  Relevant Hx: Course: Daily Update: Today's Plan:     Hypertension     Hypogonadism in male 02/12/2014    Liver cirrhosis secondary to NASH (nonalcoholic steatohepatitis) (CMS-HCC)     s/p liver transplant 2013       Past Surgical History:   Procedure Laterality Date    liver transpant      LIVER TRANSPLANTATION  2013    PR COLONOSCOPY W/BIOPSY SINGLE/MULTIPLE N/A 08/13/2018    Procedure: COLONOSCOPY, FLEXIBLE, PROXIMAL TO SPLENIC FLEXURE; WITH BIOPSY, SINGLE OR MULTIPLE;  Surgeon: Neysa Hotter, MD;  [facility]; Service: Gastroenterology    PR COLSC FLX W/RMVL OF TUMOR POLYP LESION SNARE TQ N/A 08/13/2018    Procedure: COLONOSCOPY FLEX; W/REMOV TUMOR/LES BY SNARE;  Surgeon: Neysa Hotter, MD;  Location: [facility];  Service: Gastroenterology    PR UPPER GI ENDOSCOPY,BIOPSY N/A 07/13/2015    Procedure: UGI ENDOSCOPY; WITH BIOPSY, SINGLE OR MULTIPLE;  Surgeon: Joaquin Music, MD;  Location: [facility];  Service: Gastroenterology       MEDICATIONS:     Current Facility-Administered Medications:     furosemide (LASIX) tablet 40 mg, 40 mg, Oral, Daily, Janaiya Beauchesne, Suszanne Conners, MD    mycophenolate (CELLCEPT) capsule 250 mg, 250 mg, Oral, BID, Roselie Skinner, MD, 250 mg at 11/15/22 2342    oxyCODONE (ROXICODONE) immediate release tablet 5 mg, 5 mg, Oral, Once, Roselie Skinner, MD    spironolactone (ALDACTONE) tablet 25 mg, 25 mg, Oral, Daily, Roselie Skinner, MD    tacrolimus (PROGRAF) capsule 1 mg, 1 mg, Oral, BID, Laryssa Hassing, Suszanne Conners, MD    Vancomycin - Pharmacy dosing by levels, , Other, Pharmacy dosing, Roselie Skinner, MD    Current Outpatient Medications:     amlodipine (NORVASC) 5 MG tablet, Take 1 tablet (5 mg total) by mouth daily., Disp: 90 tablet, Rfl: 3    atorvastatin (LIPITOR) 40 MG tablet, Take 1 tablet (40 mg total) by mouth nightly. Med to prevent heart attacks and help cholesterol, Disp: 90 tablet, Rfl: 3    blood-glucose meter,continuous (DEXCOM G6 RECEIVER) Misc, 1 each by Miscellaneous route in the morning. Dispense 1 receiver annually.  Sent to [facility]., Disp: 1 each, Rfl: 0    blood-glucose sensor (DEXCOM G6 SENSOR) Devi, Use as directed to monitor blood glucose, Disp: 3 each, Rfl: 0    blood-glucose sensor (DEXCOM G6 SENSOR) Devi, Use to check blood glucose  and change every 10 days., Disp: 9 each, Rfl: 3    blood-glucose transmitter (DEXCOM G6 TRANSMITTER) Devi, 1 each by Miscellaneous route Every three (3) months. [facility] pharmacy, Disp: 3 each, Rfl: 4 blood-glucose transmitter (DEXCOM G6 TRANSMITTER) Devi, Use as directed to monitor blood glucose, Disp: 1 each, Rfl: 3    carvedilol (COREG) 6.25 MG tablet, Take 1 tablet (6.25 mg total) by mouth two (2) times a day., Disp: 180 tablet, Rfl: 3    cephalexin (KEFLEX) 500 MG capsule, Take 1 capsule (500 mg total) by mouth four (4) times a day for 7 days., Disp: 28 capsule, Rfl: 0    CHOLECALCIFEROL, VITAMIN D3, (VITAMIN D3 ORAL), Take 2,000 Units by mouth daily., Disp: , Rfl:     furosemide (LASIX) 40 MG tablet, Take 1 tablet (40 mg total) by mouth daily., Disp: , Rfl:     insulin aspart (NOVOLOG FLEXPEN) 100 unit/mL (3 mL) injection pen, Inject 0.4 mL (40 Units total) under the skin Three (3) times a day before meals., Disp: 60 mL, Rfl: 3    insulin detemir U-100 (LEVEMIR) 100 unit/mL (3 mL) injection pen, Inject 0.2 mL (20 Units total) under the skin nightly., Disp: 30 mL, Rfl: 3    magnesium oxide (MAG-OX) 400 mg (241.3 mg elemental magnesium) tablet, Take 1 tablet (400 mg total) by mouth daily., Disp: , Rfl:     multivitamin (TAB-A-VITE/THERAGRAN) per tablet, Take 1 tablet by mouth daily., Disp: , Rfl:     mycophenolate (CELLCEPT) 250 mg capsule, Take 1 capsule (250 mg total) by mouth two (2) times a day., Disp: 60 capsule, Rfl: 11    pen needle, diabetic 32 gauge x 5/32 (4 mm) Ndle, Use with insulin up to 4 times a day as needed. (Patient not taking: Reported on 11/13/2022), Disp: 100 each, Rfl: 0    spironolactone (ALDACTONE) 25 MG tablet, Take 1 tablet (25 mg total) by mouth daily., Disp: 30 tablet, Rfl: 3    sulfamethoxazole-trimethoprim (BACTRIM DS) 800-160 mg per tablet, Take 1 tablet (160 mg of trimethoprim total) by mouth two (2) times a day for 7 days., Disp: 14 tablet, Rfl: 0    tacrolimus (PROGRAF) 0.5 MG capsule, Take 2 capsules (1 mg total) by mouth two (2) times a day., Disp: 360 capsule, Rfl: 3    ALLERGIES:   Patient has no known allergies.    SOCIAL HISTORY:   Social History     Tobacco Use Smoking status: Former     Passive exposure: Past    Smokeless tobacco: Never   Substance Use Topics    Alcohol use: No       FAMILY HISTORY:  Family History   Problem Relation Age of Onset    Diabetes Father     Liver disease Maternal Uncle     Diabetes Paternal Grandfather     Melanoma Neg Hx     Basal cell carcinoma Neg Hx     Squamous cell carcinoma Neg Hx     Kidney disease Neg Hx           Radiology     CT Lower Extremity RIGHT WO contrast   Preliminary Result      --Soft tissue edema without tracking soft tissue gas or organizing fluid collection. Recommend correlation with physical exam to exclude cellulitis.   --No acute osseous abnormality.      XR Tibia Fibula Right   Final Result   Please note that the sensitivity for osteomyelitis evaluation is limited  with radiography. Within these limitations:      1. No acute osseous abnormality.    2. Osteophytosis of the Achilles attachment. Calcaneal spur. These can result in painful foot heel symptoms, correlate clinically.   3. Extensive soft tissue edema throughout the right lower extremity. No radiopaque foreign bodies noted.      PVL Venous Duplex Lower Extremity Right             Labs     Labs Reviewed   BASIC METABOLIC PANEL - Abnormal; Notable for the following components:       Result Value    Potassium 3.0 (*)     Anion Gap 15 (*)     BUN 65 (*)     Creatinine 5.34 (*)     eGFR CKD-EPI (2021) Male 71 (*)     All other components within normal limits   SEDIMENTATION RATE - Abnormal; Notable for the following components:    Sed Rate 77 (*)     All other components within normal limits   C-REACTIVE PROTEIN - Abnormal; Notable for the following components:    CRP 27.0 (*)     All other components within normal limits   B-TYPE NATRIURETIC PEPTIDE - Abnormal; Notable for the following components:    BNP 603 (*)     All other components within normal limits   LACTATE, VENOUS, WHOLE BLOOD - Abnormal; Notable for the following components:    Lactate, Venous 2.5 (*)     All other components within normal limits   LACTATE, VENOUS, WHOLE BLOOD - Abnormal; Notable for the following components:    Lactate, Venous 3.4 (*)     All other components within normal limits   CBC W/ AUTO DIFF - Abnormal; Notable for the following components:    WBC 12.5 (*)     HCT 38.9 (*)     Absolute Neutrophils 10.1 (*)     All other components within normal limits   POCT GLUCOSE, INTERFACED - Normal   BLOOD CULTURE   BLOOD CULTURE   CBC W/ DIFFERENTIAL    Narrative:     The following orders were created for panel order CBC w/ Differential.                  Procedure                               Abnormality         Status                                     ---------                               -----------         ------                                     CBC w/ Differential[[id number]]         Abnormal            Final result  Please view results for these tests on the individual orders.   EXTRA TUBES    Narrative:     The following orders were created for panel order ED Extra Tubes.                  Procedure                               Abnormality         Status                                     ---------                               -----------         ------                                     LIGHT BLUE CITRATE EXTR.Marland KitchenMarland Kitchen[[id number]]                      Final result                               RED TIGER EXTRA TUBE[[id number]]                            Final result                               RED TIGER EXTRA TUBE[[id number]]                            Final result                                                 Please view results for these tests on the individual orders.   CBC W/ DIFFERENTIAL    Narrative:     The following orders were created for panel order CBC w/ Differential.                  Procedure                               Abnormality         Status                                     --------- -----------         ------                                     CBC w/ Differential[[id number]]  Please view results for these tests on the individual orders.   BASIC METABOLIC PANEL   POCT GLUCOSE-RN OBTAIN   POCT GLUCOSE-RN OBTAIN   POCT GLUCOSE-RN OBTAIN   POCT GLUCOSE-RN OBTAIN   POCT GLUCOSE-RN OBTAIN   LIGHT BLUE CITRATE EXTRA TUBE    Narrative:     Collected and received in lab.   RED TIGER EXTRA TUBE    Narrative:     Collected and received in lab.   RED TIGER EXTRA TUBE    Narrative:     Collected and received in lab.   CBC W/ AUTO DIFF         Pertinent labs & imaging results that were available during my care of the patient were reviewed by me and considered in my medical decision making (see chart for details).    Please note- This chart has been created using [employer]. Chart creation errors have been sought, but may not always be located and such creation errors, especially pronoun confusion, do NOT reflect on the standard of medical care.

## 2022-11-17 LAB — HIGH SENSITIVITY TROPONIN I - SINGLE
HIGH SENSITIVITY TROPONIN I: 225 ng/L (ref <=53)
HIGH SENSITIVITY TROPONIN I: 216 ng/L (ref <=53)

## 2022-11-17 LAB — HEPATIC FUNCTION PANEL
ALBUMIN: 3 g/dL — ABNORMAL LOW (ref 3.4–5.0)
ALKALINE PHOSPHATASE: 185 U/L — ABNORMAL HIGH (ref 46–116)
ALT (SGPT): 88 U/L — ABNORMAL HIGH (ref 10–49)
AST (SGOT): 92 U/L — ABNORMAL HIGH (ref <=34)
BILIRUBIN DIRECT: 0.4 mg/dL — ABNORMAL HIGH (ref 0.00–0.30)
BILIRUBIN TOTAL: 0.9 mg/dL (ref 0.3–1.2)
PROTEIN TOTAL: 7 g/dL (ref 5.7–8.2)

## 2022-11-17 LAB — BASIC METABOLIC PANEL
ANION GAP: 14 mmol/L (ref 5–14)
ANION GAP: 13 mmol/L (ref 5–14)
BLOOD UREA NITROGEN: 72 mg/dL — ABNORMAL HIGH (ref 9–23)
BLOOD UREA NITROGEN: 70 mg/dL — ABNORMAL HIGH (ref 9–23)
BUN / CREAT RATIO: 12
BUN / CREAT RATIO: 11
CALCIUM: 8.4 mg/dL — ABNORMAL LOW (ref 8.7–10.4)
CALCIUM: 8.7 mg/dL (ref 8.7–10.4)
CHLORIDE: 94 mmol/L — ABNORMAL LOW (ref 98–107)
CHLORIDE: 99 mmol/L (ref 98–107)
CO2: 24 mmol/L (ref 20.0–31.0)
CO2: 23 mmol/L (ref 20.0–31.0)
CREATININE: 5.87 mg/dL — ABNORMAL HIGH
CREATININE: 6.3 mg/dL — ABNORMAL HIGH
EGFR CKD-EPI (2021) MALE: 11 mL/min/{1.73_m2} — ABNORMAL LOW (ref >=60)
EGFR CKD-EPI (2021) MALE: 10 mL/min/{1.73_m2} — ABNORMAL LOW (ref >=60)
GLUCOSE RANDOM: 415 mg/dL (ref 70–179)
GLUCOSE RANDOM: 163 mg/dL (ref 70–179)
POTASSIUM: 3.3 mmol/L — ABNORMAL LOW (ref 3.4–4.8)
POTASSIUM: 3.4 mmol/L (ref 3.4–4.8)
SODIUM: 132 mmol/L — ABNORMAL LOW (ref 135–145)
SODIUM: 135 mmol/L (ref 135–145)

## 2022-11-17 LAB — COMPREHENSIVE METABOLIC PANEL
ALBUMIN: 3.1 g/dL — ABNORMAL LOW (ref 3.4–5.0)
ALKALINE PHOSPHATASE: 197 U/L — ABNORMAL HIGH (ref 46–116)
ALT (SGPT): 101 U/L — ABNORMAL HIGH (ref 10–49)
ANION GAP: 15 mmol/L — ABNORMAL HIGH (ref 5–14)
AST (SGOT): 127 U/L — ABNORMAL HIGH (ref <=34)
BILIRUBIN TOTAL: 1.1 mg/dL (ref 0.3–1.2)
BLOOD UREA NITROGEN: 68 mg/dL — ABNORMAL HIGH (ref 9–23)
BUN / CREAT RATIO: 12
CALCIUM: 8.7 mg/dL (ref 8.7–10.4)
CHLORIDE: 96 mmol/L — ABNORMAL LOW (ref 98–107)
CO2: 22 mmol/L (ref 20.0–31.0)
CREATININE: 5.88 mg/dL — ABNORMAL HIGH
EGFR CKD-EPI (2021) MALE: 11 mL/min/{1.73_m2} — ABNORMAL LOW (ref >=60)
GLUCOSE RANDOM: 284 mg/dL — ABNORMAL HIGH (ref 70–179)
POTASSIUM: 3.3 mmol/L — ABNORMAL LOW (ref 3.4–4.8)
PROTEIN TOTAL: 7.4 g/dL (ref 5.7–8.2)
SODIUM: 133 mmol/L — ABNORMAL LOW (ref 135–145)

## 2022-11-17 LAB — TACROLIMUS LEVEL, TROUGH: TACROLIMUS, TROUGH: 2.2 ng/mL — ABNORMAL LOW (ref 5.0–15.0)

## 2022-11-17 LAB — MAGNESIUM
MAGNESIUM: 2.2 mg/dL (ref 1.6–2.6)
MAGNESIUM: 2.5 mg/dL (ref 1.6–2.6)

## 2022-11-17 LAB — PROTIME-INR
INR: 0.99
PROTIME: 11.1 s (ref 9.9–12.6)

## 2022-11-17 LAB — VANCOMYCIN, RANDOM: VANCOMYCIN RANDOM: 23.3 ug/mL

## 2022-11-17 MED ORDER — DICLOFENAC 1 % TOPICAL GEL
Freq: Four times a day (QID) | TOPICAL | 0 refills | 13.00000 days
Start: 2022-11-17 — End: 2023-11-17

## 2022-11-17 MED ADMIN — insulin lispro (HumaLOG) injection 5 Units: 5 [IU] | SUBCUTANEOUS | @ 11:00:00 | Stop: 2022-11-17

## 2022-11-17 MED ADMIN — insulin glargine (LANTUS) injection 20 Units: 20 [IU] | SUBCUTANEOUS | @ 03:00:00

## 2022-11-17 MED ADMIN — heparin (porcine) 5,000 unit/mL injection 7,500 Units: 7500 [IU] | SUBCUTANEOUS | @ 09:00:00

## 2022-11-17 MED ADMIN — insulin lispro (HumaLOG) injection 0-20 Units: 0-20 [IU] | SUBCUTANEOUS | @ 12:00:00

## 2022-11-17 MED ADMIN — carvedilol (COREG) tablet 3.125 mg: 3.125 mg | ORAL | @ 12:00:00 | Stop: 2022-11-17

## 2022-11-17 MED ADMIN — hydrOXYzine (ATARAX) tablet 25 mg: 25 mg | ORAL | @ 05:00:00

## 2022-11-17 MED ADMIN — insulin lispro (HumaLOG) injection 30 Units: 30 [IU] | SUBCUTANEOUS | @ 17:00:00 | Stop: 2022-11-17

## 2022-11-17 MED ADMIN — diclofenac sodium (VOLTAREN) 1 % gel 2 g: 2 g | TOPICAL | @ 22:00:00

## 2022-11-17 MED ADMIN — HYDROmorphone (PF) injection Syrg 0.5 mg: .5 mg | INTRAVENOUS | @ 05:00:00 | Stop: 2022-11-17

## 2022-11-17 MED ADMIN — HYDROmorphone (PF) injection Syrg 0.5 mg: .5 mg | INTRAVENOUS | @ 19:00:00 | Stop: 2022-11-17

## 2022-11-17 MED ADMIN — carvedilol (COREG) tablet 3.125 mg: 3.125 mg | ORAL | @ 02:00:00

## 2022-11-17 MED ADMIN — hydrALAZINE (APRESOLINE) injection 10 mg: 10 mg | INTRAVENOUS | @ 05:00:00 | Stop: 2022-11-17

## 2022-11-17 MED ADMIN — doxycycline (VIBRA-TABS) tablet 100 mg: 100 mg | ORAL | @ 21:00:00 | Stop: 2022-11-25

## 2022-11-17 MED ADMIN — spironolactone (ALDACTONE) tablet 25 mg: 25 mg | ORAL | @ 18:00:00

## 2022-11-17 MED ADMIN — amlodipine (NORVASC) tablet 5 mg: 5 mg | ORAL | @ 12:00:00 | Stop: 2022-11-17

## 2022-11-17 MED ADMIN — HYDROmorphone (PF) injection Syrg 0.5 mg: .5 mg | INTRAVENOUS | @ 11:00:00 | Stop: 2022-11-17

## 2022-11-17 MED ADMIN — insulin lispro (HumaLOG) injection 30 Units: 30 [IU] | SUBCUTANEOUS | @ 12:00:00 | Stop: 2022-11-17

## 2022-11-17 MED ADMIN — diclofenac sodium (VOLTAREN) 1 % gel 2 g: 2 g | TOPICAL | @ 17:00:00

## 2022-11-17 MED ADMIN — potassium chloride 10 mEq in 100 mL IVPB: 10 meq | INTRAVENOUS | @ 17:00:00 | Stop: 2022-11-17

## 2022-11-17 MED ADMIN — aluminum-magnesium hydroxide-simethicone (MAALOX MAX) 80-80-8 mg/mL oral suspension: 30 mL | ORAL | @ 12:00:00

## 2022-11-17 MED ADMIN — heparin (porcine) 5,000 unit/mL injection 7,500 Units: 7500 [IU] | SUBCUTANEOUS | @ 18:00:00

## 2022-11-17 MED ADMIN — aluminum-magnesium hydroxide-simethicone (MAALOX MAX) 80-80-8 mg/mL oral suspension: 30 mL | ORAL | @ 17:00:00

## 2022-11-17 MED ADMIN — potassium chloride 10 mEq in 100 mL IVPB: 10 meq | INTRAVENOUS | @ 14:00:00 | Stop: 2022-11-17

## 2022-11-17 MED ADMIN — tacrolimus (PROGRAF) capsule 1 mg: 1 mg | ORAL | @ 12:00:00

## 2022-11-17 MED ADMIN — insulin lispro (HumaLOG) injection 0-20 Units: 0-20 [IU] | SUBCUTANEOUS | @ 17:00:00

## 2022-11-17 MED ADMIN — HYDROmorphone (PF) injection Syrg 0.5 mg: .5 mg | INTRAVENOUS | @ 03:00:00 | Stop: 2022-11-30

## 2022-11-17 MED ADMIN — doxycycline (VIBRA-TABS) tablet 100 mg: 100 mg | ORAL | @ 14:00:00 | Stop: 2022-11-25

## 2022-11-17 MED ADMIN — insulin lispro (HumaLOG) injection 0-20 Units: 0-20 [IU] | SUBCUTANEOUS | @ 03:00:00

## 2022-11-17 MED ADMIN — tacrolimus (PROGRAF) capsule 1 mg: 1 mg | ORAL | @ 02:00:00

## 2022-11-17 MED ADMIN — oxyCODONE (ROXICODONE) immediate release tablet 5 mg: 5 mg | ORAL | @ 17:00:00 | Stop: 2022-11-30

## 2022-11-17 MED ADMIN — potassium chloride ER tablet 10 mEq: 10 meq | ORAL | @ 08:00:00 | Stop: 2022-11-17

## 2022-11-17 MED ADMIN — oxyCODONE (ROXICODONE) immediate release tablet 5 mg: 5 mg | ORAL | @ 08:00:00 | Stop: 2022-11-30

## 2022-11-17 MED ADMIN — pantoprazole (Protonix) EC tablet 40 mg: 40 mg | ORAL | @ 12:00:00

## 2022-11-17 MED ADMIN — perflutren lipid microspheres (DEFINITY) injection 0.5 mL: .5 mL | INTRAVENOUS | @ 16:00:00 | Stop: 2022-11-17

## 2022-11-17 MED ADMIN — furosemide (LASIX) injection 80 mg: 80 mg | INTRAVENOUS | @ 17:00:00 | Stop: 2022-11-17

## 2022-11-17 NOTE — Unmapped (Signed)
Tacrolimus Therapeutic Monitoring Pharmacy Note    Richard [name] is a 57 y.o. male continuing tacrolimus.     Indication: Liver transplant     Date of Transplant:  2013       Prior Dosing Information: Current regimen 1 mg BID      Source(s) of information used to determine prior to admission dosing: Home Medication List or Clinic Note    Goals:  Therapeutic Drug Levels  Tacrolimus trough goal:  2-4 ng/mL  (per 09/16/21 Clinic Note, most recent note Identified.    Additional Clinical Monitoring/Outcomes  Monitor renal function (SCr and urine output) and liver function (LFTs)  Monitor for signs/symptoms of adverse events (e.g., hyperglycemia, hyperkalemia, hypomagnesemia, hypertension, headache, tremor)    Results:   Tacrolimus level:  N/A    Pharmacokinetic Considerations and Significant Drug Interactions:  Concurrent hepatotoxic medications: Doxycycline (5/24.Marland Kitchen)  Concurrent CYP3A4 substrates/inhibitors: Amlodipine (homemed)  Concurrent nephrotoxic medications:   Furosemide 40mg  daily / Spironolactone 25mg  daily (HOME MEDS)  Vancomycin (DC'd 5/24, level still at - anticipate 2-3 days before below 56mcg/m))    Tacrolimus Dosing  Tacro 1mg  BID (5/23.Marland KitchenMarland KitchenMarland Kitchen)      Tacrolimus level  5/24 = 2.2 ng/mL at 0445, drawn ~6.75h after last dose      Assessment/Plan:   5/23 - mycophenolate 250mg  bid (HOLDING)   5/24 tacro level - drawn ~ midpoint, tru trough < goal    Recommendedation(s)  Continue current regimen of 1 mg BID    Follow-up  Tacrolimus Levels:   Daily with AM labs drawn between 0600 - 0800 PRIOR to morning dose.  Dose due 0800  A pharmacist will continue to monitor and recommend levels as appropriate    Please page service pharmacist with questions/clarifications.    Drue Flirt, [facility]

## 2022-11-17 NOTE — Unmapped (Signed)
VENOUS ACCESS ULTRASOUND PROCEDURE NOTE    Indications:   Poor venous access.    The Venous Access Team has assessed this patient for the placement of a PIV. Ultrasound guidance was necessary to obtain access.     Procedure Details:  Identity of the patient was confirmed via name, medical record number and date of birth. The availability of the correct equipment was verified.    The vein was identified for ultrasound catheter insertion.  Field was prepared with necessary supplies and equipment.  Probe cover and sterile gel utilized.  Insertion site was prepped with chlorhexidine solution and allowed to dry.  The catheter extension was primed with normal saline. A(n) 20 gauge 1.75 catheter was placed in the L Forearm with 1attempt(s). See LDA for additional details.    Catheter aspirated, 4 mL blood return present. The catheter was then flushed with 10 mL of normal saline. Insertion site cleansed, and dressing applied per manufacturer guidelines. The catheter was inserted with difficulty due to poor vasculatureby Hyun Marsalis E Kadee Philyaw, RN.      RN was notified.     Thank you,     Merlinda Wrubel E Jayten Gabbard, RN Venous Access Team   [phone]     Workup / Procedure Time:  30 minutes    See images below:

## 2022-11-17 NOTE — Unmapped (Signed)
A/O x 4, afebrile, VSS on RA, PRN meds given for pain (see MAR). IV clean, dry, intact, and flushed per protocol. VTE: Heparin SQ. Falls precautions maintained. Call bell and bedside table within reach, bed in lowest position and locked. No falls or injuries noted this shift. Will continue with plan of care.    Problem: Adult Inpatient Plan of Care  Goal: Plan of Care Review  Outcome: Progressing  Goal: Patient-Specific Goal (Individualized)  Outcome: Progressing  Flowsheets (Taken 11/17/2022 0929)  Patient/Family-Specific Goals (Include Timeframe): Free from falls or injuries through discharge  Individualized Care Needs: Falls, labs, VS, pain  Anxieties, Fears or Concerns: pain  Goal: Absence of Hospital-Acquired Illness or Injury  Outcome: Progressing  Intervention: Prevent and Manage VTE (Venous Thromboembolism) Risk  Recent Flowsheet Documentation  Taken 11/17/2022 0800 by Doree Barthel, RN  Anti-Embolism Intervention: (Heparin SQ) Other (Comment)  Taken 11/17/2022 0704 by Doree Barthel, RN  Anti-Embolism Intervention: (Heparin SQ) Other (Comment)  Goal: Optimal Comfort and Wellbeing  Outcome: Progressing  Goal: Readiness for Transition of Care  Outcome: Progressing  Goal: Rounds/Family Conference  Outcome: Progressing     Problem: Fall Injury Risk  Goal: Absence of Fall and Fall-Related Injury  Outcome: Progressing

## 2022-11-17 NOTE — Unmapped (Addendum)
Rapid response called for SBP > 210 upon performing orthostatic vitals along with complains of headache shortly after arrival to the floor. New PIV placed by VAT. Hydralazine given IV per order along with atarax po for anxiety. BP within parameters shortly after medication intervention and patient endorsed relief of anxiety. Labs drawn during rapid. Expedited EKG done. Repeat troponin to be drawn in the AM.  Resting quietly in bed at this time. Plan of care ongoing. Call bell within reach. Instructed to call for assistance when needed.     2 RN skin check completed with Areme, RN. Patient with RLE cellulitis and erythema, however, no other skin issues noted at this time.      2 RN skin check  Problem: Adult Inpatient Plan of Care  Goal: Plan of Care Review  Outcome: Progressing  Flowsheets (Taken 11/17/2022 0154)  Progress: improving  Plan of Care Reviewed With: patient  Goal: Patient-Specific Goal (Individualized)  Outcome: Progressing  Goal: Absence of Hospital-Acquired Illness or Injury  Outcome: Progressing  Intervention: Identify and Manage Fall Risk  Flowsheets (Taken 11/17/2022 0154)  Safety Interventions:   nonskid shoes/slippers when out of bed   lighting adjusted for tasks/safety   low bed  Intervention: Prevent Skin Injury  Flowsheets (Taken 11/17/2022 0154)  Skin Protection: incontinence pads utilized  Intervention: Prevent and Manage VTE (Venous Thromboembolism) Risk  Recent Flowsheet Documentation  Taken 11/17/2022 0001 by Gertie Baron, RN  Anti-Embolism Intervention: (heparin sq) Other (Comment)  Goal: Optimal Comfort and Wellbeing  Outcome: Progressing  Intervention: Monitor Pain and Promote Comfort  Flowsheets (Taken 11/17/2022 0154)  Pain Management Interventions:   relaxation techniques promoted   quiet environment facilitated  Intervention: Provide Person-Centered Care  Flowsheets (Taken 11/17/2022 0154)  Trust Relationship/Rapport:   care explained   questions answered   questions encouraged thoughts/feelings acknowledged  Goal: Readiness for Transition of Care  Outcome: Progressing  Goal: Rounds/Family Conference  Outcome: Progressing     Problem: Fall Injury Risk  Goal: Absence of Fall and Fall-Related Injury  Outcome: Progressing  Intervention: Identify and Manage Contributors  Flowsheets (Taken 11/17/2022 0154)  Medication Review/Management: medications reviewed  Self-Care Promotion:   BADL personal objects within reach   BADL personal routines maintained  Intervention: Promote Injury-Free Environment  Flowsheets (Taken 11/17/2022 0154)  Safety Interventions:   nonskid shoes/slippers when out of bed   lighting adjusted for tasks/safety   low bed

## 2022-11-17 NOTE — Unmapped (Signed)
Vancomycin Therapeutic Monitoring Pharmacy Note    Richard [name] is a 57 y.o. male starting vancomycin.     Date of therapy initiation: 11/15/22    Indication: Skin and Soft Tissue Infection (SSTI) - Moderate/Severe    Prior Dosing Information: None/new initiation     Goals:  Therapeutic Drug Levels  Vancomycin trough goal: 10-20 mg/L    Additional Clinical Monitoring/Outcomes  Renal function, volume status (intake and output)    Results: Not applicable    Wt Readings from Last 1 Encounters:   11/17/22 (!) 135.4 kg (298 lb 8.1 oz)     Creatinine   Date Value Ref Range Status   11/17/2022 5.87 (H) 0.73 - 1.18 mg/dL Final   16/03/9603 5.40 (H) 0.73 - 1.18 mg/dL Final   98/04/9146 8.29 (H) 0.73 - 1.18 mg/dL Final        Pharmacokinetic Considerations and Significant Drug Interactions:  Per linear dose adjustment  Concurrent nephrotoxic meds: not applicable    Vancomycin Dosing  Vanc 2500mg  x 1 (5/22, ED)  > DOSE BY LEVELS    Vancomycin Levels  5/24 vanc = 23.3 mcg/mL at 0445    Assessment/Plan:  > vanc level is above goal and given current Scr will not be ready for another dose on 5/24.    Recommendation(s)  No Vancomycin dose needed on 5/24  Therapy being changed to Doxycycline 100mg  BID (5/24.Marland Kitchen)    Follow-up  Level due:  11/17/22 with am labs  A pharmacist will continue to monitor and order levels as appropriate    Please page service pharmacist with questions/clarifications.    Drue Flirt, [facility]

## 2022-11-17 NOTE — Unmapped (Signed)
Nephrology Consult Note    Requesting Attending Physician :  Jacqualin Combes, MD  Service Requesting Consult : [facility] ([facility])  Reason for Consult: CKD, outpatient follow-up    Assessment and Plan:  Richard [name] is a 83M with history of liver transplant (2013) 2/2 NAFLD, HFpEF, CKD5, HTN and T2DM who presents with right leg pain concerning for cellulitis.    #AKI on CKD G5A3: Baseline Cr ~4.5/GFR 14. Follows with Dr. Galen Manila for general nephrology and Dr. Margaretmary Bayley in the the pre-transplant high BMI clinic. His native kidney disease is due to biopsy-proven CNI nephropathy (biopsy done in 2020). Though this is also likely compounded by poorly controlled diabetes and HTN. He does have significant albuminuria, however unable to be on RASi or SGLT2i due to his low GFR. Though he presents with a more elevated creatinine of 5.8, his GFR is only slightly down to 11. He does endorse poor PO intake for several days so potentially a pre-renal component. Considered injury from elevated tac levels, but tac trough at 2.2. He denies symptoms of uremia. No electrolytes abnormalities or metabolic acidosis that would warrant acute dialysis. Per last clinic note with Dr. Galen Manila, kidney replacement therapy was discussed however patient was hesitant to discuss and is hopeful for a transplant prior to needing dialysis.  - Dose meds for GFR<15  - Avoid further nephrotoxins including NSAIDS, Morphine. Unless absolutely necessary, avoid CT with contrast and/or MRI with gadolinium.     - Currently no acute indication for HD    #Hypertension/volume, uncontrolled: SBP ~200s this AM, but per patient was in setting of leg pain. He states at home his blood pressure usually runs ~140-150/80s. Outpatient regimen: amlodipine 5 mg daily, carvedilol 6.25 mg BID, spiro 25 mg daily and Lasix 40 mg daily.    #T2DM, uncontrolled: Last A1c 8.3% (08/04/22). BG currently in 400s this AM. On insulin at home.     #S/p OLT (2013): Due to NAFLD. On Cellcept and tac at home, will defer dosing to transplant hepatology.     #Right leg pain, concern for cellulitis  - IV vancomycin   - Evaluation and management per primary team  - No changes to management from a nephrology standpoint at this time    RECOMMENDATIONS:   - Agree with IV diuresis per hepatology  - In regards to his blood pressure, can increase his amlodipine to 10 mg daily and increase his carvedilol to his home does of 6.25 mg BID to start  - Okay to restart spironolactone 25 mg daily from our perspective  - Patient has planned follow-up with Dr. Margaretmary Bayley on 6/3 and Dr. Galen Manila on 6/28  - He has no acute indication for renal replacement therapy at this time, denies symptoms of uremia  - We will continue to peripherally follow during the weekend, please page the on-call fellow for any urgent concerns.     [name], MD  11/17/2022 10:45 AM     Medical decision-making for 11/17/22  Findings / Data     Patient has: []  acute illness w/systemic sxs  [mod]  []  two or more stable chronic illnesses [mod]  []  one chronic illness with acute exacerbation [mod]  []  acute complicated illness  [mod]  []  Undiagnosed new problem with uncertain prognosis  [mod] [x]  illness posing risk to life or bodily function (ex. AKI)  [high]  [x]  chronic illness with severe exacerbation/progression  [high]  []  chronic illness with severe side effects of treatment  [high] AKI on  CKD5, hyperglycemia Probs At least 2:  Probs, Data, Risk   I reviewed: [x]  primary team note  []  consultant note(s)  [x]  external records [x]  chemistry results  []  CBC results  []  blood gas results  []  Other []  procedure/op note(s)   []  radiology report(s)  []  micro result(s)  []  w/ independent historian(s) Reviewed outpatient records, reviewed primary team notes, Cr elevated but lytes stable ?3 Data Review (2 of 3)    I independently interpreted: []  Urine Sediment  []  Renal US []  CXR Images  []  CT Images  []  Other []  EKG Tracing  Any     I discussed: []  Pathology results w/ QHPs(s) from other specialties  []  Procedural findings w/ QHPs(s) from other specialties []  Imaging w/ QHP(s) from other specialties  [x]  Treatment plan w/ QHP(s) from other specialties Plan discussed with primary team Any     Mgm't requires: []  Prescription drug(s)  [mod]  []  Kidney biopsy  [mod]  []  Central line placement  [mod] [x]  High risk medication use and/or intensive toxicity monitoring [high]  []  Renal replacement therapy [high]  []  High risk kidney biopsy  [high]  []  Escalation of care  [high]  []  High risk central line placement  [high] Close lab monitoring Risk      ____________________________________________________    History of Present Illness: Richard [name] is 57 y.o. male with liver transplant (2013) 2/2 NAFLD, HFpEF, CKD5, HTN and T2DM who is seen in consultation at the request of Jacqualin Combes, MD and [facility] ([facility]). Nephrology has been consulted for AKI,CKD.     Patient presents with right leg pain and swelling concerning for cellulitis. States he started having pain and swelling yesterday. No fevers, but endorses some chills. No night sweats. No SOB or chest pain. No abd pain. Some nausea, but no emesis. States he's had poor PO intake for several days, but then states he has tried to stay hydrated). He notes he has been seen in the high BMI pre-transplant clinic to continue working on weight loss in hopes of being listed for kidney transplant. States he overall feels well though. Notes his BP was high this AM because his right leg pain had worsened. His BP at home is typically ~140-150s/80s. Denies symptoms of uremia such as metallic taste in his mouth or itching.     INPATIENT MEDICATIONS:    Current Facility-Administered Medications:     acetaminophen (TYLENOL) tablet 1,000 mg, Oral, Q12H    acetaminophen (TYLENOL) tablet 650 mg, Oral, Q6H PRN    aluminum-magnesium hydroxide-simethicone (MAALOX MAX) 80-80-8 mg/mL oral suspension, Oral, Q4H PRN amlodipine (NORVASC) tablet 5 mg, Oral, Daily    carvedilol (COREG) tablet 3.125 mg, Oral, BID    dextrose (D10W) 10% bolus 125 mL, Intravenous, Q10 Min PRN    diclofenac sodium (VOLTAREN) 1 % gel 2 g, Topical, QID    doxycycline (VIBRA-TABS) tablet 100 mg, Oral, BID    glucagon injection 1 mg, Intramuscular, Once PRN    glucose chewable tablet 16 g, Oral, Q10 Min PRN    guaiFENesin (ROBITUSSIN) oral syrup, Oral, Q4H PRN    heparin (porcine) 5,000 unit/mL injection 7,500 Units, Subcutaneous, Q8H SCH    hydrOXYzine (ATARAX) tablet 25 mg, Oral, Q6H PRN    insulin glargine (LANTUS) injection 20 Units, Subcutaneous, Nightly    insulin lispro (HumaLOG) injection 0-20 Units, Subcutaneous, ACHS    insulin lispro (HumaLOG) injection 30 Units, Subcutaneous, TID AC    melatonin tablet 3 mg, Oral, Nightly  PRN    [Provider Hold] mycophenolate (CELLCEPT) capsule 250 mg, Oral, BID    ondansetron (ZOFRAN-ODT) disintegrating tablet 4 mg, Oral, Q8H PRN **OR** ondansetron (ZOFRAN) injection 4 mg, Intravenous, Q8H PRN    oxyCODONE (ROXICODONE) immediate release tablet 5 mg, Oral, Q6H PRN    pantoprazole (Protonix) EC tablet 40 mg, Oral, Daily    potassium chloride 10 mEq in 100 mL IVPB, Intravenous, Once    [Provider Hold] spironolactone (ALDACTONE) tablet 25 mg, Oral, Daily    tacrolimus (PROGRAF) capsule 1 mg, Oral, BID    OUTPATIENT MEDICATIONS:  Prior to Admission medications    Medication Dose, Route, Frequency   amlodipine (NORVASC) 5 MG tablet 5 mg, Oral, Daily (standard)   atorvastatin (LIPITOR) 40 MG tablet 40 mg, Oral, Nightly, Med to prevent heart attacks and help cholesterol   blood-glucose meter,continuous (DEXCOM G6 RECEIVER) Misc 1 each, Miscellaneous, Daily, Dispense 1 receiver annually.  Sent to [facility]   blood-glucose sensor (DEXCOM G6 SENSOR) Devi Use as directed to monitor blood glucose   blood-glucose sensor (DEXCOM G6 SENSOR) Devi Use to check blood glucose and change every 10 days.   blood-glucose transmitter (DEXCOM G6 TRANSMITTER) Devi 1 each, Miscellaneous, Every 3 months, [facility] pharmacy   blood-glucose transmitter (DEXCOM G6 TRANSMITTER) Devi Use as directed to monitor blood glucose   carvedilol (COREG) 6.25 MG tablet 6.25 mg, Oral, 2 times a day (standard)   cephalexin (KEFLEX) 500 MG capsule 500 mg, Oral, 4 times a day   CHOLECALCIFEROL, VITAMIN D3, (VITAMIN D3 ORAL) 2,000 Units, Daily (standard)   furosemide (LASIX) 40 MG tablet 40 mg, Oral, Daily (standard)   insulin aspart (NOVOLOG FLEXPEN) 100 unit/mL (3 mL) injection pen 40 Units, Subcutaneous, 3 times a day (AC)   insulin detemir U-100 (LEVEMIR) 100 unit/mL (3 mL) injection pen 20 Units, Subcutaneous, Nightly   magnesium oxide (MAG-OX) 400 mg (241.3 mg elemental magnesium) tablet 400 mg, Oral, Daily (standard)   multivitamin (TAB-A-VITE/THERAGRAN) per tablet 1 tablet, Oral, Daily (standard)   mycophenolate (CELLCEPT) 250 mg capsule 250 mg, Oral, 2 times a day (standard)   pen needle, diabetic 32 gauge x 5/32 (4 mm) Ndle Use with insulin up to 4 times a day as needed.  Patient not taking: Reported on 11/13/2022   sildenafil (VIAGRA) 50 MG tablet 50 mg, Oral, Daily PRN   spironolactone (ALDACTONE) 25 MG tablet 25 mg, Oral, Daily (standard)   sulfamethoxazole-trimethoprim (BACTRIM DS) 800-160 mg per tablet 1 tablet, Oral, 2 times a day (standard)   tacrolimus (PROGRAF) 0.5 MG capsule 1 mg, Oral, 2 times a day        ALLERGIES:  Patient has no known allergies.    MEDICAL HISTORY:  Past Medical History:   Diagnosis Date    COVID-19 07/18/2019    Family history of malignant neoplasm of prostate 06/23/2015    Hepatic cirrhosis (CMS-HCC) 06/23/2015    Overview:  Secondary to NASH; followed by Dr. Yevonne Pax, GI.  Last Assessment & Plan:  Relevant Hx: Course: Daily Update: Today's Plan:     Hypertension     Hypogonadism in male 02/12/2014    Liver cirrhosis secondary to NASH (nonalcoholic steatohepatitis) (CMS-HCC)     s/p liver transplant 2013     Past Surgical History: Procedure Laterality Date    liver transpant      LIVER TRANSPLANTATION  2013    PR COLONOSCOPY W/BIOPSY SINGLE/MULTIPLE N/A 08/13/2018    Procedure: COLONOSCOPY, FLEXIBLE, PROXIMAL TO SPLENIC FLEXURE; WITH BIOPSY, SINGLE OR MULTIPLE;  Surgeon:  Neysa Hotter, MD;  [facility];  Service: Gastroenterology    PR COLSC FLX W/RMVL OF TUMOR POLYP LESION SNARE TQ N/A 08/13/2018    Procedure: COLONOSCOPY FLEX; W/REMOV TUMOR/LES BY SNARE;  Surgeon: Neysa Hotter, MD;  Location: [facility];  Service: Gastroenterology    PR UPPER GI ENDOSCOPY,BIOPSY N/A 07/13/2015    Procedure: UGI ENDOSCOPY; WITH BIOPSY, SINGLE OR MULTIPLE;  Surgeon: Joaquin Music, MD;  [facility]: [facility];  Service: Gastroenterology     SOCIAL HISTORY  Social History     Social History Narrative    From [place]    He was in the [employer].    Now lives in [place]    He works in Engineer, petroleum        Married    1 child (age 61.5)      reports that he has quit smoking. He has been exposed to tobacco smoke. He has never used smokeless tobacco. He reports that he does not drink alcohol and does not use drugs.   FAMILY HISTORY  Family History   Problem Relation Age of Onset    Diabetes Father     Liver disease Maternal Uncle     Diabetes Paternal Grandfather     Melanoma Neg Hx     Basal cell carcinoma Neg Hx     Squamous cell carcinoma Neg Hx     Kidney disease Neg Hx         Physical Exam:   Vitals:    11/17/22 0030 11/17/22 0035 11/17/22 0041 11/17/22 0800   BP:  162/117 176/86 204/103   Pulse: 78 96 72 75   Resp:    20   Temp:    36.7 ??C (98.1 ??F)   TempSrc:    Oral   SpO2: 95% 94% 94% 99%   Weight:  (!) 135.4 kg (298 lb 8.1 oz)     Height:  177.8 cm (5' 10)       No intake/output data recorded.    Intake/Output Summary (Last 24 hours) at 11/17/2022 1045  Last data filed at 11/17/2022 0428  Gross per 24 hour   Intake --   Output 1000 ml   Net -1000 ml     Constitutional: chronically ill appearing, fidgety   Heart: RRR, no m/r/g  Lungs: CTAB, normal wob  Abd: soft, slightly distended  Ext: no significant edema   Skin: Warm local swelling of medial side of calf

## 2022-11-17 NOTE — Unmapped (Signed)
Internal Medicine ([facility]) Progress Note    Assessment & Plan:   Richard [name] is a 57 y.o. male with past medical history of CKD4 (bs Cr 4-4.7), type 2 diabetes with chronic insulin use, HFpEF, HTN, persistent A-fib not on A/C, CAD, OSA not currently on CPAP, and NASH cirrhosis s/p orthototic liver txp in 2013  who presented to [facility] with RLE cellulitis now improved after initiation of IV abx, transitioned to oral 5/24. Also found with AKI and elevated liver enzymes, nephrology and hepatology team following. Severe hyperglycemia has also been an issue.     Principal Problem:    Cellulitis of right lower extremity  Active Problems:    Liver replaced by transplant (CMS-HCC)    Immunosuppression due to drug therapy (CMS-HCC)    CKD (chronic kidney disease) stage 4, GFR 15-29 ml/min (CMS-HCC)    Severe hyperglycemia due to diabetes mellitus (CMS-HCC)    Acute kidney injury superimposed on chronic kidney disease (CMS-HCC)    CAD (coronary artery disease)    Hypomagnesemia    Elevated liver enzymes  Resolved Problems:    * No resolved hospital problems. *    Active Problems    # Right leg pain likely 2/2 cellulitis  Onset of pain when pt woke up in the morning of 5/21. Pt had identical episodes 4 times years before for cellulitis so he came to the hospital when the pain worsened throughout the day. Duplex, XR, and CT were done and leading dx is recurrent cellulitis. Pt was put on vancomycin and analgesics which improved his sx. Exam showed improved swelling of the erythematous area, but the size and redness did not change much. Great control of pain with dilaudid PRN. Pt could switch to PO meds when discharged. Vancomycin level is above goal and will change therapy to doxycycline 100 mg BID on 5/24 per pharmacist recommendation.  - s/p vancomycin 5/23, Doxycycline 100 mg BID on 5/24 to cover MRSA - , anticipate total 7d course.   - Hold acetaminophen iso LFT elevation, per hepatology  - For pain control oxycodone 5 and 10 mg PO q4h PRN ordered, spot doses of IV dilaudid 0.5 mg for severe pain (one ordered for tonight PRN but would like to avoid uptitration)    # Elevated LFT - s/p Liver transplant in 2013  Hx of liver transplant 11 years ago 2/2 NASH. Pt is on Cellcept and Tacrolimus. Pt had peaked AST (130), ALT (101), and alkphos (185) during admission. Pt had a dose of tylenol 1000 mg for pain but held off given his liver function. Pt denies alcohol use. Ddx includes congestive hepatopathy, drug-induced injury, ischemic liver injury, and transplant rejection. Pt had 8 lb wt gain in 2 days. Pt was seen by hepatology and suggested to control pt's elevated BP and recovery of liver function before discharge.   - Consult hepatology; reccs appreciated  - TTE (nl LVEF, no significant valvular abnormalities) and MRCP to assess liver injury (agreed upon w/o contrast study after interdisciplinary discussions with neph and hep teams even though risk of NSF is now theoretical with newer contrast agents). Consider biopsy if LFT is elevated with unremarkable imaging studies.   - Trial of diuresis in case this is congestive hepatopathy (IV lasix 80 mg x1 ordered)  - Cont home cellcept and tac (tac goal 2-4)    # AKI on CKD  Pt has had increased creatinine at 5.8 vs baseline of 4.5, and depressed GFR at 11 vs baseline of 14.  Pt follows kidney transplant clinic and general nephrology. In-patient nephrology also consulted. Pt was cleared to restart lasix and spironolactone. Will continue to monitor.  - Ordered IV lasix 80 mg (may adjust dose after repeat BMP), resume PO spironolactone 25 mg daily  - Continue to follow transplant clinic (scheduled on 6/3) and nephrology (6/28)    # Hypertension  SBP persistently elevated in 155-204. Pt endorses good BP control at home. Etiology most likely from holding home meds and being in pain. Pain is currently well controlled with dilaudid and abx for cellulitis.   - Amlodipine 10 mg daily, Coreg 6.25 mg BID    # HFpEF - Hx of A-fib, CAD  Pt follows cardiology closely. Pt gained about 8 lb during the admission of 2 days. Pt denies SOB but does endorse gaining of weight. 2+ pitting edema but this has been his baseline. Pt was held off from his home HTN meds and diuretics but will continue as cleared by hepatology and nephrology.  - Continue amlodipine, spironolactone, coreg, lasix noted as above     # Type 2 diabetes  Latest A1c 8.3% on 08/04/2022. Pt uses a CGM and endorses well controlled BG with Levemir and Novolog at home. Currently taking Lantus and Humalog on lower dose in the hospital setting with fluctuation of BG from low 200's to mid 400's. Continue insulin treatment.  - POC glucose 4 times daily w/ meals and bedtime  - Lantus 20u nightly, Humalog increased to 45u TID + SSI dosage as needed -> consider resuming home doses short acting 55 units TID [facility] 5/25    # Hypomagnesemia  Brief drop to 1.5 on 5/23. Pt received 2 mg on IV and the level returned to 2.2 on 5/24.  - Goal >2; replete as needed    # Hypokalemia  Pt had mild depression of K to 3.3. Pt also reported cramping of his arms which he is familiar with when he is hypokalemic. He has specifically requested IV potassium, not PO, stating that PO supplement has not been effective for him. Will replenish as needed.  - Goal >3.6; replete as needed          Daily Checklist:  Diet: Regular Diet  DVT PPx:  heparin subcutaneous  Electrolytes: Potassium and Magnesium Repleted  Code Status: Full Code  Dispo: Home pending improvement in AKI and LFTs    Team Contact Information:   Primary Team: Internal Medicine (MEDU)  Primary Resident: Bo Mcclintock MS3, Vernie Ammons PGY2  Resident's Pager: [phone] [facility] Senior Resident)    Interval History:   No acute events overnight. Pt endorses significant improvement and felt ready to go back home. After consultation with hepatology and nephrology, we decided to control his elevated BP and LFT before discharge. Pt is currently asymptomatic other than leg pain for which he requests IV dilaudid. Diuresis with IV lasix 80 mg x1 today, pending repeat BMP and Mg.     All other systems were reviewed and are negative except as noted in the HPI    Objective:   Temp:  [36.7 ??C (98 ??F)-37.4 ??C (99.3 ??F)] 37.4 ??C (99.3 ??F)  Heart Rate:  [72-96] 81  Resp:  [18-20] 20  BP: (146-218)/(83-136) 155/83  SpO2:  [94 %-99 %] 96 %    Gen: NAD, converses normally. Eating a hearty breakfast of bacon and eggs.   Eyes: Sclera anicteric, EOMI grossly normal.   HENT: Atraumatic, normocephalic.  Neck: Trachea midline.  Heart: Irregularly irregular. Normal  rate. No murmur/thrills/gallops. (Of note, pt reports childhood murmur.)  Lungs: CTAB, no crackles or wheezes.  Extremities: Erythematous and tender local swelling on the medial side of R lower leg. Visible improvement compared to yesterday. 2+ pitting edema below knees bilaterally. Visually symmetric leg circumference.  Neuro: A&Ox3. Grossly symmetric, non-focal. Intact motor/sensation of legs.  Skin:  No rashes, lesions on clothed exam except R lower leg.  Psych: Alert, oriented.      [name], MS3    I attest that I have reviewed the medical student note and that the components of the history of the present illness, the physical exam, and the assessment and plan documented were performed by me or were performed in my presence by the student where I verified the documentation and performed (or re-performed) the exam and medical decision making. Barnett Abu, MD

## 2022-11-17 NOTE — Unmapped (Signed)
Hepatology Consult Service   Initial Consultation         Assessment and Recommendations:   Richard [name] is a 57 y.o. male with a PMHx of stage 4 CKD, insulin-dependent T2DM, HFpEF, HTN, persistent A-fib, CAD, OSA, and NASH s/p OLT 2013 who presented to [facility] with right leg pain and swelling FTH cellulitis. The patient is seen in consultation at the request of Jacqualin Combes, MD ([facility] ([facility])) for elevated LFTs and immunosuppression mgmt for hx OLT    Elevated transaminases - NASH s/p OLT 2013  The patient was found to have elevated LFTs during presentation for RLE cellulitis. He has a mixed pattern of LFT elevations, but they have begun to downtrend slightly since presentation (AST/ALT 127/101 --> 92/88). His bilirubin is unremarkable and alk phos is elevated at 185 (197).     Differential diagnosis of his acute rise in LFTs includes congestive hepatopathy in the setting of HFpEF & clinical volume overload, transplant rejection, or biliary obstruction. Also possible that he has developed chronic liver disease of transplanted liver (likely metabolic associated) in context of metabolic disease and may have lower reserve for any insults such as acute illness. Less likely causes are infectious process, ischemia, or drug-induced liver injury. Drug-induced is unlikely considering no new antibiotics or other hepatotoxic meds have been identified, that being said would avoid tylenol as able for now. Infection is unlikely to be the cause given his cellulitis is improving and a lack of fever and negative blood cultures. Congestive hepatopathy is possible given he seems to be volume overloaded on exam and troponins are elevated (peak 225). BNP is elevated but unchanged from prior measurements, but this may be unreliable due to his body habitus and appears to only be checked at times of symptomatic overload. Liver US demonstrated borderline low resistive indices in the left hepatic artery and dilation of the common bile duct (was 0.6 cm 08/2021, now measuring 0.9 cm) which merits further evaluation w/ MRCP to r/o biliary obstruction (stone or stenosis, such as anastomotic) - non-con okay given renal function.     The slight downtrend in his LFTs is reassuring although the patient will need to stay admitted for further workup. We will plan to move forward with an MRCP to rule out obstruction. Plan to follow up TTE results for evidence of CHF suggestive of congestive hepatopathy and would likely benefit from diuresis. Pending lab trend and outcome of above evaluations, we will determine if liver biopsy is warranted to rule out rejection in the setting of subtherapeutic tac levels. In the meantime, we recommend avoiding potentially hepatotoxic medications including Tylenol. Given his cellulitis is better controlled, cellcept can be resumed.    Recommendations:  - obtain MRCP (non-con)  - f/u TTE  - continue to trend LFTs daily  - hold Tylenol  - okay to resume cellcept  - continue tac with goal level 2-4, dosing per pharmacy  - agree with diuresis by primary team    Issues Impacting Complexity of Management:  -The patient is on drug therapy with tacrolimus and mycophenolate that requires intensive monitoring for toxicity with scheduled BMPs and drug levels    Recommendations discussed with the patient's primary team. We will continue to follow along with you.    Diana Eves, MS4    I attest that I have reviewed the student note and that the components of the history of the present illness, the physical exam, and the assessment and plan documented were performed by  me or were performed in my presence by the student where I verified the documentation and performed (or re-performed) the exam and medical decision making. Quintin Alto, MD PGY7    Subjective:   Richard [name] is a 57 y.o. male with PMHx significant for HFpEF, NASH s/p OLT 2013, stage 4 CKD 2/2 HTN and insulin-dependent T2DM, persistent A-fib (not on [facility]) who presented to [facility] with unilateral leg pain and swelling, found to have RLE cellulitis. We are consulted for immunosuppression management and LFT elevation.     He first noticed the leg swelling on 5/22. The pain became really severe so he went to the ED. The pain is constant and throbbing. He did not take any pain medications at home though he did have a positive tylenol level (4.2, 2 doses in ED prior to draw). His pain is now better controlled with dilaudid.    He had a headache last night in the setting of severe hypertension which has since resolved. He denies abdominal pain at the moment but says he frequently has lower quadrant pain that improves with a bowel movement. He denies diarrhea or constipation. He denies recent travel. He denies taking an supplements, new medications within the last 3 months, or consuming alcohol. He reports no antibiotics prior to presentation.    He reports multiple previous episodes of elevated liver enzymes when he is sick or his blood pressure is high. He denies a history of transplant complications including rejection. He previously had normal LFTs on 5/16. He has had prior hospitalizations with workup for elevated LFTs. In February 2023 he was admitted to [facility] for SOB and marked hypertension. Workup for elevated LFTs was unremarkable and liver enzymes improved. He was hospitalized most recently at [facility] in March 2023 for chest pain, where he had workup for elevated LFTs which was presumed to be due to severe HTN, workup was unremarkable, and his LFTs improved without intervention.     He is followed by cardiology for HFpEF for which he takes lasix 40 mg per day and spironolactone 25 per day. He has persistent Afib not on anticoagulation, rate or rhythm control. He is in evaluation for renal transplant. He is also currently in the process of workup for bariatric surgery to qualify for transplant.    -I have reviewed the patient's prior records from care everywhere as summarized in the HPI    Objective:   Temp:  [36.7 ??C (98 ??F)-37.1 ??C (98.8 ??F)] 37.1 ??C (98.8 ??F)  Heart Rate:  [72-96] 72  Resp:  [18] 18  BP: (143-218)/(86-136) 176/86  SpO2:  [94 %-97 %] 94 %    Physical Exam:  General: well-appearing, NAD  Cardiac: irregularly irregular rhythm  HEENT: sclera anicteric  Pulm: normal work of breathing on room air  Abdominal: obese, soft, non-distended, non-tender  Extremities: 2+ pitting edema of bilateral LE, erythema and swelling of the R medial LE    Pertinent Labs/Studies:  -I have visualized the patient's hepatic function panel dated 5/24 which shows elevated transaminases and elevated alk phos.

## 2022-11-18 LAB — BASIC METABOLIC PANEL
ANION GAP: 13 mmol/L (ref 5–14)
ANION GAP: 11 mmol/L (ref 5–14)
BLOOD UREA NITROGEN: 77 mg/dL — ABNORMAL HIGH (ref 9–23)
BLOOD UREA NITROGEN: 80 mg/dL — ABNORMAL HIGH (ref 9–23)
BUN / CREAT RATIO: 12
BUN / CREAT RATIO: 12
CALCIUM: 8.3 mg/dL — ABNORMAL LOW (ref 8.7–10.4)
CALCIUM: 8.7 mg/dL (ref 8.7–10.4)
CHLORIDE: 100 mmol/L (ref 98–107)
CHLORIDE: 99 mmol/L (ref 98–107)
CO2: 24 mmol/L (ref 20.0–31.0)
CO2: 25 mmol/L (ref 20.0–31.0)
CREATININE: 6.66 mg/dL — ABNORMAL HIGH
CREATININE: 6.59 mg/dL — ABNORMAL HIGH
EGFR CKD-EPI (2021) MALE: 9 mL/min/{1.73_m2} — ABNORMAL LOW (ref >=60)
EGFR CKD-EPI (2021) MALE: 9 mL/min/{1.73_m2} — ABNORMAL LOW (ref >=60)
GLUCOSE RANDOM: 208 mg/dL — ABNORMAL HIGH (ref 70–179)
GLUCOSE RANDOM: 86 mg/dL (ref 70–179)
POTASSIUM: 3.3 mmol/L — ABNORMAL LOW (ref 3.4–4.8)
POTASSIUM: 3.4 mmol/L (ref 3.4–4.8)
SODIUM: 137 mmol/L (ref 135–145)
SODIUM: 135 mmol/L (ref 135–145)

## 2022-11-18 LAB — URINALYSIS WITH MICROSCOPY
BACTERIA: NONE SEEN /HPF
BILIRUBIN UA: NEGATIVE
GLUCOSE UA: 100 — AB
KETONES UA: NEGATIVE
LEUKOCYTE ESTERASE UA: NEGATIVE
NITRITE UA: NEGATIVE
PH UA: 6 (ref 5.0–9.0)
PROTEIN UA: 200 — AB
RBC UA: 1 /HPF (ref <=3)
SPECIFIC GRAVITY UA: 1.012 (ref 1.003–1.030)
SQUAMOUS EPITHELIAL: 1 /HPF (ref 0–5)
UROBILINOGEN UA: <2
WBC UA: 3 /HPF — ABNORMAL HIGH (ref <=2)

## 2022-11-18 LAB — HEPATIC FUNCTION PANEL
ALBUMIN: 2.8 g/dL — ABNORMAL LOW (ref 3.4–5.0)
ALKALINE PHOSPHATASE: 164 U/L — ABNORMAL HIGH (ref 46–116)
ALT (SGPT): 57 U/L — ABNORMAL HIGH (ref 10–49)
AST (SGOT): 40 U/L — ABNORMAL HIGH (ref <=34)
BILIRUBIN DIRECT: 0.2 mg/dL (ref 0.00–0.30)
BILIRUBIN TOTAL: 0.5 mg/dL (ref 0.3–1.2)
PROTEIN TOTAL: 6.7 g/dL (ref 5.7–8.2)

## 2022-11-18 LAB — CBC
HEMATOCRIT: 30.8 % — ABNORMAL LOW (ref 39.0–48.0)
HEMOGLOBIN: 10.4 g/dL — ABNORMAL LOW (ref 12.9–16.5)
MEAN CORPUSCULAR HEMOGLOBIN CONC: 33.8 g/dL (ref 32.0–36.0)
MEAN CORPUSCULAR HEMOGLOBIN: 28 pg (ref 25.9–32.4)
MEAN CORPUSCULAR VOLUME: 82.6 fL (ref 77.6–95.7)
MEAN PLATELET VOLUME: 8.1 fL (ref 6.8–10.7)
PLATELET COUNT: 198 10*9/L (ref 150–450)
RED BLOOD CELL COUNT: 3.73 10*12/L — ABNORMAL LOW (ref 4.26–5.60)
RED CELL DISTRIBUTION WIDTH: 14.9 % (ref 12.2–15.2)
WBC ADJUSTED: 8.4 10*9/L (ref 3.6–11.2)

## 2022-11-18 LAB — ADDON DIFFERENTIAL ONLY
BASOPHILS ABSOLUTE COUNT: 0 10*9/L (ref 0.0–0.1)
BASOPHILS RELATIVE PERCENT: 0.4 %
EOSINOPHILS ABSOLUTE COUNT: 0.2 10*9/L (ref 0.0–0.5)
EOSINOPHILS RELATIVE PERCENT: 2.5 %
LYMPHOCYTES ABSOLUTE COUNT: 1 10*9/L — ABNORMAL LOW (ref 1.1–3.6)
LYMPHOCYTES RELATIVE PERCENT: 11.4 %
MONOCYTES ABSOLUTE COUNT: 0.4 10*9/L (ref 0.3–0.8)
MONOCYTES RELATIVE PERCENT: 4.8 %
NEUTROPHILS ABSOLUTE COUNT: 6.8 10*9/L (ref 1.8–7.8)
NEUTROPHILS RELATIVE PERCENT: 80.9 %

## 2022-11-18 LAB — VANCOMYCIN, RANDOM: VANCOMYCIN RANDOM: 25.8 ug/mL

## 2022-11-18 LAB — MAGNESIUM
MAGNESIUM: 2.4 mg/dL (ref 1.6–2.6)
MAGNESIUM: 2.5 mg/dL (ref 1.6–2.6)

## 2022-11-18 LAB — TACROLIMUS LEVEL, TIMED: TACROLIMUS BLOOD: 2 ng/mL

## 2022-11-18 LAB — PHOSPHORUS: PHOSPHORUS: 5.5 mg/dL — ABNORMAL HIGH (ref 2.4–5.1)

## 2022-11-18 MED ADMIN — mycophenolate (CELLCEPT) capsule 250 mg: 250 mg | ORAL

## 2022-11-18 MED ADMIN — oxyCODONE (ROXICODONE) immediate release tablet 5 mg: 5 mg | ORAL | Stop: 2022-11-30

## 2022-11-18 MED ADMIN — heparin (porcine) 5,000 unit/mL injection 7,500 Units: 7500 [IU] | SUBCUTANEOUS | @ 03:00:00

## 2022-11-18 MED ADMIN — diclofenac sodium (VOLTAREN) 1 % gel 2 g: 2 g | TOPICAL | @ 22:00:00

## 2022-11-18 MED ADMIN — insulin lispro (HumaLOG) injection 0-20 Units: 0-20 [IU] | SUBCUTANEOUS | @ 11:00:00

## 2022-11-18 MED ADMIN — carvedilol (COREG) tablet 6.25 mg: 6.25 mg | ORAL | @ 15:00:00

## 2022-11-18 MED ADMIN — tacrolimus (PROGRAF) capsule 1 mg: 1 mg | ORAL

## 2022-11-18 MED ADMIN — furosemide (LASIX) injection 80 mg: 80 mg | INTRAVENOUS | @ 18:00:00 | Stop: 2022-11-18

## 2022-11-18 MED ADMIN — hydrOXYzine (ATARAX) tablet 25 mg: 25 mg | ORAL | @ 01:00:00

## 2022-11-18 MED ADMIN — cefepime (MAXIPIME) 1 g in sodium chloride 0.9 % (NS) 100 mL IVPB-MBP: 1 g | INTRAVENOUS | @ 18:00:00 | Stop: 2022-11-21

## 2022-11-18 MED ADMIN — diclofenac sodium (VOLTAREN) 1 % gel 2 g: 2 g | TOPICAL | @ 03:00:00

## 2022-11-18 MED ADMIN — mycophenolate (CELLCEPT) capsule 250 mg: 250 mg | ORAL | @ 15:00:00

## 2022-11-18 MED ADMIN — diclofenac sodium (VOLTAREN) 1 % gel 2 g: 2 g | TOPICAL | @ 10:00:00

## 2022-11-18 MED ADMIN — tacrolimus (PROGRAF) capsule 1 mg: 1 mg | ORAL | @ 15:00:00

## 2022-11-18 MED ADMIN — linezolid (ZYVOX) tablet 600 mg: 600 mg | ORAL | @ 18:00:00 | Stop: 2022-11-25

## 2022-11-18 MED ADMIN — heparin (porcine) 5,000 unit/mL injection 7,500 Units: 7500 [IU] | SUBCUTANEOUS | @ 10:00:00

## 2022-11-18 MED ADMIN — insulin glargine (LANTUS) injection 20 Units: 20 [IU] | SUBCUTANEOUS | @ 03:00:00

## 2022-11-18 MED ADMIN — carvedilol (COREG) tablet 6.25 mg: 6.25 mg | ORAL

## 2022-11-18 MED ADMIN — insulin lispro (HumaLOG) injection 0-20 Units: 0-20 [IU] | SUBCUTANEOUS | @ 03:00:00

## 2022-11-18 MED ADMIN — potassium chloride ER tablet 20 mEq: 20 meq | ORAL | @ 18:00:00 | Stop: 2022-11-18

## 2022-11-18 MED ADMIN — insulin lispro (HumaLOG) injection 45 Units: 45 [IU] | SUBCUTANEOUS | @ 11:00:00 | Stop: 2022-11-18

## 2022-11-18 MED ADMIN — cefTRIAXone (ROCEPHIN) 2 g in sodium chloride 0.9 % (NS) 100 mL IVPB-MBP: 2 g | INTRAVENOUS | @ 04:00:00 | Stop: 2022-11-18

## 2022-11-18 MED ADMIN — heparin (porcine) 5,000 unit/mL injection 7,500 Units: 7500 [IU] | SUBCUTANEOUS | @ 18:00:00

## 2022-11-18 MED ADMIN — doxycycline (VIBRA-TABS) tablet 100 mg: 100 mg | ORAL | @ 10:00:00 | Stop: 2022-11-18

## 2022-11-18 MED ADMIN — amlodipine (NORVASC) tablet 10 mg: 10 mg | ORAL | @ 15:00:00

## 2022-11-18 MED ADMIN — pantoprazole (Protonix) EC tablet 40 mg: 40 mg | ORAL | @ 15:00:00

## 2022-11-18 MED ADMIN — HYDROmorphone (PF) injection Syrg 0.5 mg: .5 mg | INTRAVENOUS | @ 20:00:00 | Stop: 2022-11-18

## 2022-11-18 MED ADMIN — insulin lispro (HumaLOG) injection 50 Units: 50 [IU] | SUBCUTANEOUS | @ 18:00:00 | Stop: 2022-11-18

## 2022-11-18 NOTE — Unmapped (Signed)
Tacrolimus Therapeutic Monitoring Pharmacy Note    Richard [name] is a 57 y.o. male continuing tacrolimus.     Indication: Liver transplant     Date of Transplant:  2013       Prior Dosing Information: Current regimen 1 mg BID      Source(s) of information used to determine prior to admission dosing: Home Medication List or Clinic Note    Goals:  Therapeutic Drug Levels  Tacrolimus trough goal:  2-4 ng/mL  (per 09/16/21 Clinic Note, most recent note Identified.    Additional Clinical Monitoring/Outcomes  Monitor renal function (SCr and urine output) and liver function (LFTs)  Monitor for signs/symptoms of adverse events (e.g., hyperglycemia, hyperkalemia, hypomagnesemia, hypertension, headache, tremor)    Results:   Tacrolimus level:  2 ng/mL, drawn appropriately    Pharmacokinetic Considerations and Significant Drug Interactions:  Concurrent hepatotoxic medications: Doxycycline (5/24.Marland Kitchen)  Concurrent CYP3A4 substrates/inhibitors: Amlodipine (homemed)  Concurrent nephrotoxic medications:   Furosemide 40mg  daily / Spironolactone 25mg  daily (HOME MEDS)  Vancomycin (DC'd 5/24, level still at - anticipate 2-3 days before below 52mcg/m))    Tacrolimus Dosing  Tacro 1mg  BID (5/23.Marland KitchenMarland KitchenMarland Kitchen)      Tacrolimus level  5/24 = 2.2 ng/mL at 0445, drawn ~6.75h after last dose      Assessment/Plan:   5/23 - mycophenolate 250mg  bid (HOLDING)   5/24 tacro level - drawn ~ midpoint, tru trough < goal    Recommendedation(s)  Continue current regimen of 1 mg BID    Follow-up  Tacrolimus Levels:   Daily with AM labs drawn between 0600 - 0800 PRIOR to morning dose.  Dose due 0800  A pharmacist will continue to monitor and recommend levels as appropriate    Please page service pharmacist with questions/clarifications.    Deanna Artis, PharmD

## 2022-11-18 NOTE — Unmapped (Addendum)
Pt admitted with cellulitis of RLE, AKI, and elevated liver enzymes. Pt stated he felt terrible  vs done with temp at 38, md aware.  Labs done, urine sent , chest xray done. Temp did decrease but pt still feels unwell.  RLE pain continues, pt receiving oxycodone but stated dilaudid works better.  Pt received IV antibx as ordered. Pt ordered pizza and took 1 bite and stated he wasn't eating dinner but has been drinking adequate amounts throughout the shift esp soda and juice. Pt states he wants to be discharged in am, md aware. Pt showered .  Self care.  Problem: Adult Inpatient Plan of Care  Goal: Plan of Care Review  Outcome: Ongoing - Unchanged  Goal: Patient-Specific Goal (Individualized)  Outcome: Ongoing - Unchanged  Flowsheets (Taken 11/18/2022 0143)  Patient/Family-Specific Goals (Include Timeframe): Pt will remain free from falls or injuries through discharge  Individualized Care Needs: Monitor labs, vs, pain management, iv antibx, achs  Anxieties, Fears or Concerns: pain management  Goal: Absence of Hospital-Acquired Illness or Injury  Outcome: Ongoing - Unchanged  Intervention: Identify and Manage Fall Risk  Recent Flowsheet Documentation  Taken 11/17/2022 2000 by Valeta Harms, RN  Safety Interventions:   commode/urinal/bedpan at bedside   lighting adjusted for tasks/safety   environmental modification   nonskid shoes/slippers when out of bed  Intervention: Prevent and Manage VTE (Venous Thromboembolism) Risk  Recent Flowsheet Documentation  Taken 11/18/2022 0030 by Valeta Harms, RN  Anti-Embolism Intervention: (heparin sq) Other (Comment)  Taken 11/17/2022 2200 by Valeta Harms, RN  Anti-Embolism Intervention: (heparin sq) Other (Comment)  Taken 11/17/2022 2000 by Valeta Harms, RN  VTE Prevention/Management:   anticoagulant therapy   ambulation promoted  Anti-Embolism Intervention: (heparin sq) Other (Comment)  Goal: Optimal Comfort and Wellbeing  Outcome: Ongoing - Unchanged  Goal: Readiness for Transition of Care  Outcome: Ongoing - Unchanged  Goal: Rounds/Family Conference  Outcome: Ongoing - Unchanged     Problem: Fall Injury Risk  Goal: Absence of Fall and Fall-Related Injury  Outcome: Ongoing - Unchanged  Intervention: Promote Injury-Free Environment  Recent Flowsheet Documentation  Taken 11/17/2022 2000 by Valeta Harms, RN  Safety Interventions:   commode/urinal/bedpan at bedside   lighting adjusted for tasks/safety   environmental modification   nonskid shoes/slippers when out of bed     Problem: Pain Acute  Goal: Optimal Pain Control and Function  Outcome: Ongoing - Unchanged

## 2022-11-18 NOTE — Unmapped (Addendum)
Patient is alert oriented X 4. On room Air. Vss. He is independent and had his bowel movement and voiding this shift. He is on Q4 vital signs, ACHS and on regular diet. He is on Heparin subcutaneous. On antibiotics new PIV started on right forearm. Meal time insulin 50 units covered.     Problem: Adult Inpatient Plan of Care  Goal: Plan of Care Review  Outcome: Progressing  Flowsheets (Taken 11/18/2022 1502)  Progress: improving  Plan of Care Reviewed With: patient  Goal: Patient-Specific Goal (Individualized)  Outcome: Progressing  Goal: Absence of Hospital-Acquired Illness or Injury  Outcome: Progressing  Intervention: Identify and Manage Fall Risk  Recent Flowsheet Documentation  Taken 11/18/2022 1400 by Concha Norway, RN  Safety Interventions:   environmental modification   low bed   fall reduction program maintained  Taken 11/18/2022 1206 by Concha Norway, RN  Safety Interventions:   environmental modification   fall reduction program maintained   low bed  Taken 11/18/2022 1026 by Concha Norway, RN  Safety Interventions:   environmental modification   fall reduction program maintained   low bed  Taken 11/18/2022 0812 by Concha Norway, RN  Safety Interventions:   environmental modification   fall reduction program maintained   low bed  Taken 11/18/2022 0710 by Concha Norway, RN  Safety Interventions:   environmental modification   fall reduction program maintained   low bed  Intervention: Prevent and Manage VTE (Venous Thromboembolism) Risk  Recent Flowsheet Documentation  Taken 11/18/2022 1026 by Concha Norway, RN  Anti-Embolism Intervention: (Heparin Sq) Other (Comment)  Goal: Optimal Comfort and Wellbeing  Outcome: Progressing  Goal: Readiness for Transition of Care  Outcome: Progressing  Goal: Rounds/Family Conference  Outcome: Progressing     Problem: Fall Injury Risk  Goal: Absence of Fall and Fall-Related Injury  Outcome: Progressing  Intervention: Promote Injury-Free Environment  Recent Flowsheet Documentation  Taken 11/18/2022 1400 by Concha Norway, RN  Safety Interventions:   environmental modification   low bed   fall reduction program maintained  Taken 11/18/2022 1206 by Concha Norway, RN  Safety Interventions:   environmental modification   fall reduction program maintained   low bed  Taken 11/18/2022 1026 by Concha Norway, RN  Safety Interventions:   environmental modification   fall reduction program maintained   low bed  Taken 11/18/2022 1610 by Concha Norway, RN  Safety Interventions:   environmental modification   fall reduction program maintained   low bed  Taken 11/18/2022 0710 by Concha Norway, RN  Safety Interventions:   environmental modification   fall reduction program maintained   low bed     Problem: Pain Acute  Goal: Optimal Pain Control and Function  Outcome: Progressing

## 2022-11-18 NOTE — Unmapped (Signed)
Internal Medicine (MEDU) Progress Note    Assessment & Plan:   Richard [name] is a 57 y.o. male with PMH NASH cirrhosis s/p OLT 2013, HFpEF, CKD5, persistent A-fib not on AC, CAD, HTN, OSA not on CPAP who presented to [facility] 5/22 with RLE cellulitis, fount to have an AKI on CKD and elevated LFTs.     Principal Problem:    Cellulitis of right lower extremity  Active Problems:    Hypertension    Liver replaced by transplant (CMS-HCC)    Immunosuppression due to drug therapy (CMS-HCC)    CKD (chronic kidney disease) stage 4, GFR 15-29 ml/min (CMS-HCC)    (HFpEF) heart failure with preserved ejection fraction (CMS-HCC)    Severe hyperglycemia due to diabetes mellitus (CMS-HCC)    Acute kidney injury superimposed on chronic kidney disease (CMS-HCC)    CAD (coronary artery disease)    Hypomagnesemia    Elevated liver enzymes    Hypokalemia  Resolved Problems:    * No resolved hospital problems. *    Active Problems    RLE Cellulitis   Patient with history of cellulitis (4 times this year), presented with RLE pain and erythema 1 day prior to presentation consistent with recurrent cellulitis. Patient initially started on vancomycin and was transitioned to doxycycline 5/24.  Exam is improving but patient fevered overnight 5/24, ceftriaxone was added.  With fever, concerned that the bacteria causing the cellulitis is not being adequately treated.  Patient may have a multidrug-resistant bacteria given his history of cellulitis, versus an atypical bacteria like Pseudomonas given his poorly controlled diabetes.  Plan to broaden patient to linezolid and cefepime for MRSA and pseudomonal coverage.  Blood cultures repeated overnight, will follow-up.  - Bcx 5/22: [id number]  - Bcx 5/24: sent   - Antibiotics:    - s/p Clindamycin (5/22)   - s/p Vancomycin (5/23)   - STOP Doxycycline (5/24 - 5/25)   - STOP Ceftriaxone (5/25)   - START Linezolid (5/25 - )   - START Cefepime (5/25 - )  - Vanc level   - Pain Control: stop oxy, start dilaudid PO prn    Elevated LFTs, improving - s/p Liver transplant in 2013  Hx of liver transplant 11 years ago 2/2 NASH. On Cellcept and Tacrolimus. LFTs elevated on admission, most likely 2/2 congestive hepatopathy as LFTs have improved with diuresis. Ddx also includes DILI, ischemic liver injury, and rejection. Hepatology was consulted, recommended MRCP, continue diuresis.    - Hepatology following, appreciate recs   - non-con MRCP  - daily HFP  - hold Tylenol  - Cellcept   - Tac (goal 2-4), dosing per pharmacy  - Diuresis     AKI on CKD G5A3  Baseline Cr 4.6-4.8. Follows with Nephrology (Dr. Galen Manila) and Transplant Nephrology (Dr. Margaretmary Bayley). 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 Cr elevated on admission at 5.33, continues to rise. Differential includes prerenal iso poor po intake vs V overload, intrarenal from tac. Nephrology consulted, will continue lasix and hold spiro. Avoid NSAIDS and morphine.   - Nephro following, appreciate recs  - Lasix 80 mg IV  - Hold spiro  - Continue to follow transplant clinic (scheduled on 6/3) and nephrology (6/28)  - Avoid NSAIDs and Morphine     T2DM  Latest A1c 8.3% on 08/04/2022. Pt uses a CGM and endorses well controlled BG with Levemir and Novolog at home.  Glucose remains elevated will increase Humalog from 45 to 50 units 3 times  daily AC.  - Lantus 20u Nightly  - INCREASE Humalog from 45 to 50u TIDAC (home 55u)  - SSI  - POCT BG QID    HFpEF - Hx of A-fib, CAD  Remains hypervolemic on exam, diuresis resumed 5/24. Discussed with nephrology, ok to continue diuresis but will hold spironolactone.  - Continue lasix 80 mg IV daily  - HOLD spironolactone   - Continue carvedilol 6.25 mg BID     Hypokalemia, stable - Hypomagnesemia improved  Iso diuresis. Monitoring lytes and replenishing cautiously given AKI on CKD. Requests IV potassium.  - Goal K 3.6, Mg 2   - daily BMP, Mg  - 1600 BMP and Mg s/p lasix     Hypertension  Well controlled at home, most likely elevated iso pain. Continue home medications.   - Amlodipine 10 mg daily  - Coreg 6.25 mg BID      Daily Checklist:  Diet: Regular Diet  DVT PPx:  heparin SQ  Electrolytes: Potassium Repleted  Code Status: Full Code  Dispo: Home pending improvement in AKI and LFTs, and infection    Team Contact Information:   Primary Team: Internal Medicine (MEDU)  Primary Resident: Marja Kays, MD  Resident's Pager: [phone] (Gen Med[facility] Intern - Alvester Morin)    Interval History:   Patient fevered overnight to 38, added doxycycline, urine culture and blood cultures collected.  Patient is feeling very well this morning with no concerns.  Still having pain in his leg, says that oxy is not working and he would like IV Dilaudid.  Discussed that we could try oral Dilaudid prior to IV.    All other systems were reviewed and are negative except as noted in the HPI    Objective:   Temp:  [36.6 ??C (97.9 ??F)-38 ??C (100.4 ??F)] 37.2 ??C (99 ??F)  Heart Rate:  [68-89] 82  Resp:  [20-24] 22  BP: (141-181)/(71-100) 181/100  SpO2:  [94 %-97 %] 97 %    Gen: NAD, converses normally.  HENT: Atraumatic, normocephalic.  Heart: Irregularly irregular. Normal rate. S3. No murmur/thrills/gallops.   Lungs: CTAB, no crackles or wheezes.  Extremities: Erythematous and tender local swelling on the medial side of R lower leg. Improved and less erythematous. Pitting edema bilaterally. Visually symmetric leg circumference.  Neuro:  Grossly symmetric, non-focal. Intact motor/sensation of legs.  Skin:  RLL cellulitis   Psych: Alert, oriented.

## 2022-11-18 NOTE — Unmapped (Signed)
VENOUS ACCESS ULTRASOUND PROCEDURE NOTE    Indications:   Poor venous access.    The Venous Access Team has assessed this patient for the placement of a PIV. Ultrasound guidance was necessary to obtain access.     Procedure Details:  Identity of the patient was confirmed via name, medical record number and date of birth. The availability of the correct equipment was verified.    The vein was identified for ultrasound catheter insertion.  Field was prepared with necessary supplies and equipment.  Probe cover and sterile gel utilized.  Insertion site was prepped with chlorhexidine solution and allowed to dry.  The catheter extension was primed with normal saline. A(n) 22 gauge 1.75 catheter was placed in the R Forearm with 1attempt(s). See LDA for additional details.    Catheter aspirated, 2 mL blood return present. The catheter was then flushed with 20 mL of normal saline. Insertion site cleansed, and dressing applied per manufacturer guidelines. The catheter was inserted without difficulty by Gillie Manners, RN.     Care RN was notified.     Thank you,     Gillie Manners, RN Venous Access Team   [phone]     Workup / Procedure Time:  30 minutes    See images below:

## 2022-11-19 LAB — BASIC METABOLIC PANEL
ANION GAP: 12 mmol/L (ref 5–14)
BLOOD UREA NITROGEN: 80 mg/dL — ABNORMAL HIGH (ref 9–23)
BUN / CREAT RATIO: 11
CALCIUM: 8.6 mg/dL — ABNORMAL LOW (ref 8.7–10.4)
CHLORIDE: 99 mmol/L (ref 98–107)
CO2: 26 mmol/L (ref 20.0–31.0)
CREATININE: 7.09 mg/dL — ABNORMAL HIGH
EGFR CKD-EPI (2021) MALE: 9 mL/min/{1.73_m2} — ABNORMAL LOW (ref >=60)
GLUCOSE RANDOM: 94 mg/dL (ref 70–179)
POTASSIUM: 3.5 mmol/L (ref 3.4–4.8)
SODIUM: 137 mmol/L (ref 135–145)

## 2022-11-19 LAB — HEPATIC FUNCTION PANEL
ALBUMIN: 2.6 g/dL — ABNORMAL LOW (ref 3.4–5.0)
ALKALINE PHOSPHATASE: 214 U/L — ABNORMAL HIGH (ref 46–116)
ALT (SGPT): 73 U/L — ABNORMAL HIGH (ref 10–49)
AST (SGOT): 90 U/L — ABNORMAL HIGH (ref <=34)
BILIRUBIN DIRECT: 0.3 mg/dL (ref 0.00–0.30)
BILIRUBIN TOTAL: 0.5 mg/dL (ref 0.3–1.2)
PROTEIN TOTAL: 6.6 g/dL (ref 5.7–8.2)

## 2022-11-19 LAB — TACROLIMUS LEVEL, TIMED: TACROLIMUS BLOOD: 3 ng/mL

## 2022-11-19 LAB — MAGNESIUM: MAGNESIUM: 2.6 mg/dL (ref 1.6–2.6)

## 2022-11-19 MED ADMIN — cefepime (MAXIPIME) 1 g in sodium chloride 0.9 % (NS) 100 mL IVPB-MBP: 1 g | INTRAVENOUS | @ 15:00:00 | Stop: 2022-11-21

## 2022-11-19 MED ADMIN — amlodipine (NORVASC) tablet 10 mg: 10 mg | ORAL | @ 15:00:00

## 2022-11-19 MED ADMIN — carvedilol (COREG) tablet 6.25 mg: 6.25 mg | ORAL | @ 15:00:00

## 2022-11-19 MED ADMIN — carvedilol (COREG) tablet 6.25 mg: 6.25 mg | ORAL

## 2022-11-19 MED ADMIN — insulin lispro (HumaLOG) injection 0-20 Units: 0-20 [IU] | SUBCUTANEOUS | @ 21:00:00

## 2022-11-19 MED ADMIN — insulin lispro (HumaLOG) injection 45 Units: 45 [IU] | SUBCUTANEOUS | @ 16:00:00

## 2022-11-19 MED ADMIN — insulin lispro (HumaLOG) injection 45 Units: 45 [IU] | SUBCUTANEOUS | @ 03:00:00

## 2022-11-19 MED ADMIN — diclofenac sodium (VOLTAREN) 1 % gel 2 g: 2 g | TOPICAL

## 2022-11-19 MED ADMIN — tacrolimus (PROGRAF) capsule 1 mg: 1 mg | ORAL

## 2022-11-19 MED ADMIN — mycophenolate (CELLCEPT) capsule 250 mg: 250 mg | ORAL

## 2022-11-19 MED ADMIN — insulin glargine (LANTUS) injection 20 Units: 20 [IU] | SUBCUTANEOUS | @ 01:00:00

## 2022-11-19 MED ADMIN — tacrolimus (PROGRAF) capsule 1 mg: 1 mg | ORAL | @ 15:00:00

## 2022-11-19 MED ADMIN — heparin (porcine) 5,000 unit/mL injection 7,500 Units: 7500 [IU] | SUBCUTANEOUS | @ 02:00:00

## 2022-11-19 MED ADMIN — pantoprazole (Protonix) EC tablet 40 mg: 40 mg | ORAL | @ 15:00:00

## 2022-11-19 MED ADMIN — linezolid (ZYVOX) tablet 600 mg: 600 mg | ORAL | Stop: 2022-11-25

## 2022-11-19 MED ADMIN — diclofenac sodium (VOLTAREN) 1 % gel 2 g: 2 g | TOPICAL | @ 15:00:00

## 2022-11-19 MED ADMIN — diclofenac sodium (VOLTAREN) 1 % gel 2 g: 2 g | TOPICAL | @ 21:00:00

## 2022-11-19 MED ADMIN — diclofenac sodium (VOLTAREN) 1 % gel 2 g: 2 g | TOPICAL | @ 11:00:00

## 2022-11-19 MED ADMIN — insulin lispro (HumaLOG) injection 0-20 Units: 0-20 [IU] | SUBCUTANEOUS | @ 01:00:00

## 2022-11-19 MED ADMIN — oxyCODONE (ROXICODONE) immediate release tablet 10 mg: 10 mg | ORAL | Stop: 2022-12-02

## 2022-11-19 MED ADMIN — linezolid (ZYVOX) tablet 600 mg: 600 mg | ORAL | @ 15:00:00 | Stop: 2022-11-25

## 2022-11-19 MED ADMIN — cefepime (MAXIPIME) 1 g in sodium chloride 0.9 % (NS) 100 mL IVPB-MBP: 1 g | INTRAVENOUS | @ 01:00:00 | Stop: 2022-11-21

## 2022-11-19 MED ADMIN — HYDROmorphone (DILAUDID) tablet 1 mg: 1 mg | ORAL | @ 03:00:00 | Stop: 2022-11-19

## 2022-11-19 MED ADMIN — insulin lispro (HumaLOG) injection 45 Units: 45 [IU] | SUBCUTANEOUS | @ 21:00:00

## 2022-11-19 MED ADMIN — heparin (porcine) 5,000 unit/mL injection 7,500 Units: 7500 [IU] | SUBCUTANEOUS | @ 17:00:00

## 2022-11-19 MED ADMIN — mycophenolate (CELLCEPT) capsule 250 mg: 250 mg | ORAL | @ 15:00:00

## 2022-11-19 MED ADMIN — heparin (porcine) 5,000 unit/mL injection 7,500 Units: 7500 [IU] | SUBCUTANEOUS | @ 11:00:00

## 2022-11-19 NOTE — Unmapped (Signed)
Tacrolimus Therapeutic Monitoring Pharmacy Note    Richard [name] is a 57 y.o. male continuing tacrolimus.     Indication: Liver transplant     Date of Transplant:  2013       Prior Dosing Information: Current regimen 1 mg BID      Source(s) of information used to determine prior to admission dosing: Home Medication List or Clinic Note    Goals:  Therapeutic Drug Levels  Tacrolimus trough goal:  2-4 ng/mL  (per 09/16/21 Clinic Note, most recent note Identified.    Additional Clinical Monitoring/Outcomes  Monitor renal function (SCr and urine output) and liver function (LFTs)  Monitor for signs/symptoms of adverse events (e.g., hyperglycemia, hyperkalemia, hypomagnesemia, hypertension, headache, tremor)    Results:   Tacrolimus level:  3 ng/mL, drawn appropriately    Pharmacokinetic Considerations and Significant Drug Interactions:  Concurrent hepatotoxic medications: Doxycycline (5/24.Marland Kitchen)  Concurrent CYP3A4 substrates/inhibitors: Amlodipine (homemed)  Concurrent nephrotoxic medications:   Furosemide 40mg  daily / Spironolactone 25mg  daily (HOME MEDS)  Vancomycin (DC'd 5/24, level still at - anticipate 2-3 days before below 11mcg/m))    Tacrolimus Dosing  Tacro 1mg  BID (5/23.Marland KitchenMarland KitchenMarland Kitchen)      Tacrolimus level  5/24 = 2.2 ng/mL at 0445, drawn ~6.75h after last dose      Assessment/Plan:   5/23 - mycophenolate 250mg  bid (HOLDING)   5/24 tacro level - drawn ~ midpoint, tru trough < goal    Recommendedation(s)  Continue current regimen of 1 mg BID    Follow-up  Tacrolimus Levels:   Daily with AM labs drawn between 0600 - 0800 PRIOR to morning dose.  Dose due 0800  A pharmacist will continue to monitor and recommend levels as appropriate    Please page service pharmacist with questions/clarifications.    Deanna Artis, PharmD

## 2022-11-19 NOTE — Unmapped (Signed)
Internal Medicine (MEDU) Progress Note    Assessment & Plan:   Richard [name] is a 57 y.o. male with PMH NASH cirrhosis s/p OLT 2013, HFpEF, CKD5, persistent A-fib not on AC, CAD, HTN, OSA not on CPAP who presented to [facility] 5/22 with RLE cellulitis, fount to have an AKI on CKD and elevated LFTs.     Principal Problem:    Cellulitis of right lower extremity  Active Problems:    Hypertension    Liver replaced by transplant (CMS-HCC)    Immunosuppression due to drug therapy (CMS-HCC)    CKD (chronic kidney disease) stage 4, GFR 15-29 ml/min (CMS-HCC)    (HFpEF) heart failure with preserved ejection fraction (CMS-HCC)    Severe hyperglycemia due to diabetes mellitus (CMS-HCC)    Acute kidney injury superimposed on chronic kidney disease (CMS-HCC)    CAD (coronary artery disease)    Elevated liver enzymes    Hypokalemia  Resolved Problems:    Hypomagnesemia    Active Problems    AKI on CKD G5A3  Baseline Cr 4.6-4.8. Follows with Nephrology (Dr. Galen Manila) and Transplant Nephrology (Dr. Margaretmary Bayley). 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 Cr elevated on admission at 5.33, continues to rise (7.09 5/26), but UOP remains appopriate. Differential includes prerenal iso poor po intake vs V overload, intrarenal from tac. Nephrology following, patient appears euvolemic, urine with granular casts, and Cr not improving; will hold diuresis.   - Nephro following, appreciate recs  - Continue to follow transplant clinic (scheduled on 6/3) and nephrology (6/28)  - Hold diuresis   - Hold spiro  - Avoid NSAIDs and Morphine   - Vanc level     Elevated LFTs,- s/p Liver Transplant in 2013  Hx of liver transplant 11 years ago 2/2 NASH. On Cellcept and Tacrolimus. LFTs elevated on admission, differential includes congestive hepatopathy, Dili, ischemic liver injury, rejection.  LFTs initially improved diuresis, but worsened 5/26.  Plan to hold diuresis.  Attempting to pursue MRCP, patient unable to tolerate 5/26, will attempt to pursue anxiolytic versus sedation.  - Hepatology following, appreciate recs   - non-con MRCP  - daily HFP  - Cellcept   - Tac (goal 2-4), dosing per pharmacy  - hold Tylenol  - hold diuresis     RLE Cellulitis, improved  Patient with multiple episodes of cellulitis, presented with right lower extremity cellulitis.  Patient initially doing well but fevered with transition from vancomycin to doxycycline, broadened to linezolid and cefepime to provide MRSA and pseudomonal coverage.  Patient remains afebrile and hemodynamically stable.  Plan to continue vacs for 7-day course.  - Bcx 5/22: [id number]  - Bcx 5/24: [id number]   - Antibiotics:    - s/p Clindamycin (5/22)   - s/p Vancomycin (5/23)   - s/p Doxycycline (5/24 - 5/25)   - s/p Ceftriaxone (5/25)   - Linezolid (5/25 - 5/31)   - Cefepime (5/25 - 5/31)  - trend CBC     T2DM  Latest A1c 8.3% on 08/04/2022. Pt uses a CGM and endorses well controlled BG with Levemir and Novolog at home.    - Lantus 20u Nightly  - Humalog from 45u TIDAC (home 55u)  - SSI  - POCT BG QID    HFpEF - Hx of A-fib, CAD  Discussed with nephrology, they believe patient is euvolemic.  Plan to hold Lasix and hold spironolactone in the setting of worsening renal function.  Remains rate controlled.  - HOLD lasix  - HOLD  spironolactone   - Continue carvedilol 6.25 mg BID     Hypokalemia, stable - Hypomagnesemia improved  Monitoring lytes and replenishing cautiously given AKI on CKD. Requests IV potassium when needs repletion.  - Goal K 3.6, Mg 2   - daily BMP, Mg  - 1600 BMP and Mg s/p lasix     Chronic Conditions  Hypertension: Continue amlodipine 10 mg, Coreg 6.25 mg twice daily      Daily Checklist:  Diet: Regular Diet  DVT PPx:  heparin SQ  Electrolytes:     caution with repletion given AKI on CKD  Code Status: Full Code  Dispo: Home pending improvement in AKI and LFTs, and infection    Team Contact Information:   Primary Team: Internal Medicine (MEDU)  Primary Resident: Marja Kays, MD  Resident's Pager: [phone] (Gen MedU Intern - Alvester Morin)    Interval History:   Remained afebrile overnight, tolerated p.o. Dilaudid well which improved his pain.  No pain this morning and feeling good.  Patient was supposed to go for MRCP this morning, but was sent back for anxiety/refusal to go to MRI machine.  Says he will need anesthesia to complete MRI.    All other systems were reviewed and are negative except as noted in the HPI    Objective:   Temp:  [37.2 ??C (99 ??F)-37.4 ??C (99.3 ??F)] 37.2 ??C (99 ??F)  Heart Rate:  [72-80] 72  Resp:  [24-28] 28  BP: (128-159)/(57-89) 142/84  SpO2:  [95 %-96 %] 95 %    Gen: NAD, converses normally.  HENT: Atraumatic, normocephalic.  Heart: Irregularly irregular. Normal rate. S3. No murmur/thrills/gallops.   Lungs: CTAB, no crackles or wheezes.  Extremities: erythema has improved on RLE and become less red and more purple. Edema has improved.   Neuro:  Grossly symmetric, non-focal. Intact motor/sensation of legs.  Psych: Alert, oriented.

## 2022-11-19 NOTE — Unmapped (Signed)
Patient alert and oriented. Vitals stable. Monitoring blood sugar. Up ad lib. Receives IV abx. Continent of bowel and bladder. Bed low and callbell within reach.  Problem: Adult Inpatient Plan of Care  Goal: Plan of Care Review  Outcome: Progressing  Goal: Patient-Specific Goal (Individualized)  Outcome: Progressing  Goal: Absence of Hospital-Acquired Illness or Injury  Outcome: Progressing  Intervention: Identify and Manage Fall Risk  Recent Flowsheet Documentation  Taken 11/19/2022 1622 by Jearld Shines, RN  Safety Interventions: fall reduction program maintained  Taken 11/19/2022 1417 by Jearld Shines, RN  Safety Interventions: fall reduction program maintained  Taken 11/19/2022 1220 by Jearld Shines, RN  Safety Interventions: fall reduction program maintained  Taken 11/19/2022 0800 by Jearld Shines, RN  Safety Interventions: fall reduction program maintained  Intervention: Prevent and Manage VTE (Venous Thromboembolism) Risk  Recent Flowsheet Documentation  Taken 11/19/2022 1622 by Jearld Shines, RN  Anti-Embolism Intervention: (heparin) Other (Comment)  Taken 11/19/2022 1417 by Jearld Shines, RN  Anti-Embolism Intervention: (heparin) Other (Comment)  Taken 11/19/2022 1220 by Jearld Shines, RN  Anti-Embolism Intervention: (heparin) Other (Comment)  Taken 11/19/2022 0800 by Jearld Shines, RN  Anti-Embolism Intervention: (heparin) Other (Comment)  Goal: Optimal Comfort and Wellbeing  Outcome: Progressing  Goal: Readiness for Transition of Care  Outcome: Progressing  Goal: Rounds/Family Conference  Outcome: Progressing

## 2022-11-19 NOTE — Unmapped (Signed)
Patient remains alert and oriented x 4. Vitals stable. Med complaint. Coverage provided with meal-time insulin and correctional along with nightly lantus. Endorsing pain to the RLE unrelieved by PRN oxycodone. One time dose of Dilaudid given per order. Resting quietly in bed at this time. Plan of care ongoing. Call bell within reach. Instructed to call for assistance when needed.     Problem: Adult Inpatient Plan of Care  Goal: Plan of Care Review  Outcome: Progressing  Flowsheets (Taken 11/18/2022 2350)  Progress: improving  Plan of Care Reviewed With: patient  Goal: Patient-Specific Goal (Individualized)  Outcome: Progressing  Goal: Absence of Hospital-Acquired Illness or Injury  Outcome: Progressing  Intervention: Prevent and Manage VTE (Venous Thromboembolism) Risk  Recent Flowsheet Documentation  Taken 11/18/2022 2200 by Gertie Baron, RN  Anti-Embolism Intervention: (heparin sq) Other (Comment)  Taken 11/18/2022 2000 by Gertie Baron, RN  Anti-Embolism Intervention: (heparin sq) Other (Comment)  Goal: Optimal Comfort and Wellbeing  Outcome: Progressing  Intervention: Monitor Pain and Promote Comfort  Flowsheets (Taken 11/18/2022 2350)  Pain Management Interventions:   relaxation techniques promoted   quiet environment facilitated  Intervention: Provide Person-Centered Care  Flowsheets (Taken 11/18/2022 2350)  Trust Relationship/Rapport:   care explained   questions answered   questions encouraged   thoughts/feelings acknowledged  Goal: Readiness for Transition of Care  Outcome: Progressing  Goal: Rounds/Family Conference  Outcome: Progressing     Problem: Fall Injury Risk  Goal: Absence of Fall and Fall-Related Injury  Outcome: Progressing  Intervention: Identify and Manage Contributors  Recent Flowsheet Documentation  Taken 11/18/2022 2350 by Gertie Baron, RN  Medication Review/Management: medications reviewed     Problem: Pain Acute  Goal: Optimal Pain Control and Function  Outcome: Progressing  Intervention: Develop Pain Management Plan  Flowsheets (Taken 11/18/2022 2350)  Pain Management Interventions:   relaxation techniques promoted   quiet environment facilitated  Intervention: Prevent or Manage Pain  Flowsheets (Taken 11/18/2022 2350)  Sleep/Rest Enhancement:   regular sleep/rest pattern promoted   relaxation techniques promoted   awakenings minimized  Medication Review/Management: medications reviewed

## 2022-11-20 LAB — BASIC METABOLIC PANEL
ANION GAP: 13 mmol/L (ref 5–14)
BLOOD UREA NITROGEN: 80 mg/dL — ABNORMAL HIGH (ref 9–23)
BUN / CREAT RATIO: 11
CALCIUM: 8.6 mg/dL — ABNORMAL LOW (ref 8.7–10.4)
CHLORIDE: 100 mmol/L (ref 98–107)
CO2: 22 mmol/L (ref 20.0–31.0)
CREATININE: 7.46 mg/dL — ABNORMAL HIGH
EGFR CKD-EPI (2021) MALE: 8 mL/min/{1.73_m2} — ABNORMAL LOW (ref >=60)
GLUCOSE RANDOM: 205 mg/dL — ABNORMAL HIGH (ref 70–179)
POTASSIUM: 3.4 mmol/L (ref 3.4–4.8)
SODIUM: 135 mmol/L (ref 135–145)

## 2022-11-20 LAB — URINALYSIS WITH MICROSCOPY
BILIRUBIN UA: NEGATIVE
GLUCOSE UA: 200 — AB
KETONES UA: NEGATIVE
LEUKOCYTE ESTERASE UA: NEGATIVE
NITRITE UA: NEGATIVE
PH UA: 5.5 (ref 5.0–9.0)
PROTEIN UA: 200 — AB
RBC UA: 3 /HPF (ref <=3)
SPECIFIC GRAVITY UA: 1.015 (ref 1.003–1.030)
SQUAMOUS EPITHELIAL: 2 /HPF (ref 0–5)
UROBILINOGEN UA: <2
WBC UA: <1 /HPF (ref <=2)

## 2022-11-20 LAB — CBC
HEMATOCRIT: 32.5 % — ABNORMAL LOW (ref 39.0–48.0)
HEMOGLOBIN: 10.9 g/dL — ABNORMAL LOW (ref 12.9–16.5)
MEAN CORPUSCULAR HEMOGLOBIN CONC: 33.4 g/dL (ref 32.0–36.0)
MEAN CORPUSCULAR HEMOGLOBIN: 27.7 pg (ref 25.9–32.4)
MEAN CORPUSCULAR VOLUME: 83 fL (ref 77.6–95.7)
MEAN PLATELET VOLUME: 8.4 fL (ref 6.8–10.7)
PLATELET COUNT: 232 10*9/L (ref 150–450)
RED BLOOD CELL COUNT: 3.92 10*12/L — ABNORMAL LOW (ref 4.26–5.60)
RED CELL DISTRIBUTION WIDTH: 14.3 % (ref 12.2–15.2)
WBC ADJUSTED: 9.6 10*9/L (ref 3.6–11.2)

## 2022-11-20 LAB — HEPATIC FUNCTION PANEL
ALBUMIN: 2.7 g/dL — ABNORMAL LOW (ref 3.4–5.0)
ALKALINE PHOSPHATASE: 233 U/L — ABNORMAL HIGH (ref 46–116)
ALT (SGPT): 77 U/L — ABNORMAL HIGH (ref 10–49)
AST (SGOT): 77 U/L — ABNORMAL HIGH (ref <=34)
BILIRUBIN DIRECT: 0.4 mg/dL — ABNORMAL HIGH (ref 0.00–0.30)
BILIRUBIN TOTAL: 0.8 mg/dL (ref 0.3–1.2)
PROTEIN TOTAL: 6.9 g/dL (ref 5.7–8.2)

## 2022-11-20 LAB — COMPREHENSIVE METABOLIC PANEL
ALBUMIN: 2.8 g/dL — ABNORMAL LOW (ref 3.4–5.0)
ALKALINE PHOSPHATASE: 242 U/L — ABNORMAL HIGH (ref 46–116)
ALT (SGPT): 73 U/L — ABNORMAL HIGH (ref 10–49)
ANION GAP: 13 mmol/L (ref 5–14)
AST (SGOT): 55 U/L — ABNORMAL HIGH (ref <=34)
BILIRUBIN TOTAL: 0.6 mg/dL (ref 0.3–1.2)
BLOOD UREA NITROGEN: 85 mg/dL — ABNORMAL HIGH (ref 9–23)
BUN / CREAT RATIO: 11
CALCIUM: 8.9 mg/dL (ref 8.7–10.4)
CHLORIDE: 103 mmol/L (ref 98–107)
CO2: 21 mmol/L (ref 20.0–31.0)
CREATININE: 7.51 mg/dL — ABNORMAL HIGH
EGFR CKD-EPI (2021) MALE: 8 mL/min/{1.73_m2} — ABNORMAL LOW (ref >=60)
GLUCOSE RANDOM: 119 mg/dL (ref 70–179)
POTASSIUM: 3.2 mmol/L — ABNORMAL LOW (ref 3.4–4.8)
PROTEIN TOTAL: 7.3 g/dL (ref 5.7–8.2)
SODIUM: 137 mmol/L (ref 135–145)

## 2022-11-20 LAB — BLOOD GAS CRITICAL CARE PANEL, VENOUS
BASE EXCESS VENOUS: -2.5 — ABNORMAL LOW (ref -2.0–2.0)
CALCIUM IONIZED VENOUS (MG/DL): 4.57 mg/dL (ref 4.40–5.40)
GLUCOSE WHOLE BLOOD: 120 mg/dL (ref 70–179)
HCO3 VENOUS: 22 mmol/L (ref 22–27)
HEMOGLOBIN BLOOD GAS: 11.5 g/dL — ABNORMAL LOW
LACTATE BLOOD VENOUS: 1.6 mmol/L (ref 0.5–1.8)
O2 SATURATION VENOUS: 60.9 % (ref 40.0–85.0)
PCO2 VENOUS: 38 mmHg — ABNORMAL LOW (ref 40–60)
PH VENOUS: 7.37 (ref 7.32–7.43)
PO2 VENOUS: 34 mmHg (ref 30–55)
POTASSIUM WHOLE BLOOD: 3.2 mmol/L — ABNORMAL LOW (ref 3.4–4.6)
SODIUM WHOLE BLOOD: 142 mmol/L (ref 135–145)

## 2022-11-20 LAB — TACROLIMUS LEVEL, TIMED: TACROLIMUS BLOOD: 3.1 ng/mL

## 2022-11-20 LAB — HIGH SENSITIVITY TROPONIN I - SINGLE: HIGH SENSITIVITY TROPONIN I: 145 ng/L (ref <=53)

## 2022-11-20 LAB — SODIUM, URINE, RANDOM: SODIUM URINE: 31 mmol/L

## 2022-11-20 LAB — MAGNESIUM: MAGNESIUM: 2.5 mg/dL (ref 1.6–2.6)

## 2022-11-20 LAB — PHOSPHORUS
PHOSPHORUS: 6.8 mg/dL — ABNORMAL HIGH (ref 2.4–5.1)
PHOSPHORUS: 6.8 mg/dL — ABNORMAL HIGH (ref 2.4–5.1)

## 2022-11-20 LAB — CREATININE, URINE: CREATININE, URINE: 131.8 mg/dL

## 2022-11-20 MED ADMIN — amlodipine (NORVASC) tablet 10 mg: 10 mg | ORAL | @ 13:00:00

## 2022-11-20 MED ADMIN — insulin lispro (HumaLOG) injection 45 Units: 45 [IU] | SUBCUTANEOUS | @ 17:00:00

## 2022-11-20 MED ADMIN — cefepime (MAXIPIME) 1 g in sodium chloride 0.9 % (NS) 100 mL IVPB-MBP: 1 g | INTRAVENOUS | @ 01:00:00 | Stop: 2022-11-21

## 2022-11-20 MED ADMIN — mycophenolate (CELLCEPT) capsule 250 mg: 250 mg | ORAL | @ 13:00:00

## 2022-11-20 MED ADMIN — carvedilol (COREG) tablet 6.25 mg: 6.25 mg | ORAL | @ 01:00:00

## 2022-11-20 MED ADMIN — diclofenac sodium (VOLTAREN) 1 % gel 2 g: 2 g | TOPICAL | @ 03:00:00

## 2022-11-20 MED ADMIN — tacrolimus (PROGRAF) capsule 1 mg: 1 mg | ORAL | @ 13:00:00

## 2022-11-20 MED ADMIN — heparin (porcine) 5,000 unit/mL injection 7,500 Units: 7500 [IU] | SUBCUTANEOUS | @ 10:00:00

## 2022-11-20 MED ADMIN — insulin lispro (HumaLOG) injection 0-20 Units: 0-20 [IU] | SUBCUTANEOUS | @ 23:00:00

## 2022-11-20 MED ADMIN — diclofenac sodium (VOLTAREN) 1 % gel 2 g: 2 g | TOPICAL | @ 23:00:00

## 2022-11-20 MED ADMIN — tacrolimus (PROGRAF) capsule 1 mg: 1 mg | ORAL | @ 01:00:00

## 2022-11-20 MED ADMIN — insulin glargine (LANTUS) injection 18 Units: 18 [IU] | SUBCUTANEOUS | @ 02:00:00

## 2022-11-20 MED ADMIN — cefepime (MAXIPIME) 1 g in sodium chloride 0.9 % (NS) 100 mL IVPB-MBP: 1 g | INTRAVENOUS | @ 13:00:00 | Stop: 2022-11-21

## 2022-11-20 MED ADMIN — insulin lispro (HumaLOG) injection 45 Units: 45 [IU] | SUBCUTANEOUS | @ 13:00:00

## 2022-11-20 MED ADMIN — insulin lispro (HumaLOG) injection 0-20 Units: 0-20 [IU] | SUBCUTANEOUS | @ 17:00:00

## 2022-11-20 MED ADMIN — insulin lispro (HumaLOG) injection 0-20 Units: 0-20 [IU] | SUBCUTANEOUS | @ 13:00:00

## 2022-11-20 MED ADMIN — mycophenolate (CELLCEPT) capsule 250 mg: 250 mg | ORAL | @ 02:00:00

## 2022-11-20 MED ADMIN — heparin (porcine) 5,000 unit/mL injection 7,500 Units: 7500 [IU] | SUBCUTANEOUS | @ 02:00:00

## 2022-11-20 MED ADMIN — linezolid (ZYVOX) tablet 600 mg: 600 mg | ORAL | @ 02:00:00 | Stop: 2022-11-25

## 2022-11-20 MED ADMIN — carvedilol (COREG) tablet 6.25 mg: 6.25 mg | ORAL | @ 13:00:00

## 2022-11-20 MED ADMIN — insulin lispro (HumaLOG) injection 0-20 Units: 0-20 [IU] | SUBCUTANEOUS | @ 01:00:00

## 2022-11-20 MED ADMIN — heparin (porcine) 5,000 unit/mL injection 7,500 Units: 7500 [IU] | SUBCUTANEOUS | @ 17:00:00

## 2022-11-20 MED ADMIN — insulin lispro (HumaLOG) injection 45 Units: 45 [IU] | SUBCUTANEOUS | @ 23:00:00

## 2022-11-20 MED ADMIN — pantoprazole (Protonix) EC tablet 40 mg: 40 mg | ORAL | @ 13:00:00

## 2022-11-20 MED ADMIN — linezolid (ZYVOX) tablet 600 mg: 600 mg | ORAL | @ 13:00:00 | Stop: 2022-11-25

## 2022-11-20 NOTE — Unmapped (Signed)
Internal Medicine (MEDU) Progress Note    Assessment & Plan:   Richard [name] is a 57 y.o. male with PMH NASH cirrhosis s/p OLT 2013, HFpEF, CKD5, persistent A-fib not on AC, CAD, HTN, OSA not on CPAP who presented to [facility] 5/22 with RLE cellulitis, fount to have an AKI on CKD and elevated LFTs.     Principal Problem:    Cellulitis of right lower extremity  Active Problems:    Hypertension    Liver replaced by transplant (CMS-HCC)    Immunosuppression due to drug therapy (CMS-HCC)    CKD (chronic kidney disease) stage 4, GFR 15-29 ml/min (CMS-HCC)    (HFpEF) heart failure with preserved ejection fraction (CMS-HCC)    Severe hyperglycemia due to diabetes mellitus (CMS-HCC)    Acute kidney injury superimposed on chronic kidney disease (CMS-HCC)    CAD (coronary artery disease)    Elevated liver enzymes    Hypokalemia  Resolved Problems:    Hypomagnesemia    Active Problems    AKI on CKD G5A3  Baseline Cr 4.6-4.8. Follows with Nephrology (Dr. Galen Manila) and Transplant Nephrology (Dr. Margaretmary Bayley). 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 Cr elevated on admission at 5.33, continues to rise (7.46 5/27), but UOP remains appopriate. Differential includes prerenal iso poor po intake vs V overload, intrarenal from tac. Nephrology following, patient appears euvolemic, urine with granular casts, and Cr not improving; will continue to hold lasix and spironolactone.  Plan to obtain renal ultrasound, urinalysis.  - Nephro following, appreciate recs  - Continue to follow transplant clinic (scheduled on 6/3) and nephrology (6/28)  - Hold diuresis   - Hold spiro  - Avoid NSAIDs and Morphine   - Vanc level   - Renal ultrasound  - UA    Elevated LFTs, stable - s/p Liver Transplant in 2013  Hx of liver transplant 11 years ago 2/2 NASH. On Cellcept and Tacrolimus. LFTs elevated on admission, differential includes congestive hepatopathy, Dili, ischemic liver injury, rejection.  LFTs initially improved diuresis, but worsened 5/26. Plan to hold diuresis.  Attempting to pursue MRCP, patient unable to tolerate 5/26, will attempt to pursue MRCP under sedation.  - Hepatology following, appreciate recs   - non-con MRCP  - daily HFP  - Cellcept   - Tac (goal 2-4), dosing per pharmacy  - hold Tylenol  - hold diuresis     RLE Cellulitis, improved  Patient with multiple episodes of cellulitis, presented with right lower extremity cellulitis.  Patient initially doing well but fevered with transition from vancomycin to doxycycline, broadened to linezolid and cefepime to provide MRSA and pseudomonal coverage.  Patient remains afebrile and hemodynamically stable.  Plan to continue antibiotics for 7-day course.  - Bcx 5/22: NG72h  - Bcx 5/24: [id number]   - Antibiotics:    - s/p Clindamycin (5/22)   - s/p Vancomycin (5/23)   - s/p Doxycycline (5/24 - 5/25)   - s/p Ceftriaxone (5/25)   - Linezolid (5/25 - 5/31)   - Cefepime (5/25 - 5/31)  - trend CBC     T2DM  Latest A1c 8.3% on 08/04/2022. Pt uses a CGM and endorses well controlled BG with Levemir and Novolog at home.    - Lantus 18u Nightly  - Humalog from 45u TIDAC (home 55u)  - SSI  - POCT BG QID    HFpEF - Hx of A-fib, CAD  Discussed with nephrology, they believe patient is euvolemic.  Plan to hold Lasix and hold spironolactone  in the setting of worsening renal function.  Remains rate controlled.  - HOLD lasix  - HOLD spironolactone   - Continue carvedilol 6.25 mg BID     Hypokalemia, stable - Hypomagnesemia resolved  Monitoring lytes and replenishing cautiously given AKI on CKD. Requests IV potassium when needs repletion.  - Goal K 3.6, Mg 2   - daily BMP, Mg  - 1600 BMP and Mg s/p lasix     Chronic Conditions  Hypertension: Continue amlodipine 10 mg, Coreg 6.25 mg twice daily      Daily Checklist:  Diet: Regular Diet  DVT PPx:  heparin SQ  Electrolytes:     caution with repletion given AKI on CKD  Code Status: Full Code  Dispo: Home pending improvement in AKI and LFTs  Team Contact Information:   Primary Team: Internal Medicine (MEDU)  Primary Resident: Marja Kays, MD  Resident's Pager: [phone] (Gen MedU Intern - Alvester Morin)    Interval History:   NAEON.  Patient feels well without concerns.  Working on arranging sedated MRI.  Continuing to urinate.    All other systems were reviewed and are negative except as noted in the HPI    Objective:   Temp:  [36.8 ??C (98.2 ??F)-37.2 ??C (99 ??F)] 37.1 ??C (98.8 ??F)  Heart Rate:  [65-73] 67  Resp:  [22] 22  BP: (119-138)/(59-83) 138/83  SpO2:  [92 %-100 %] 95 %    Gen: NAD, converses normally.  HENT: Atraumatic, normocephalic.  Heart: Irregularly irregular. Normal rate. S3. No murmur/thrills/gallops.   Lungs: CTAB, no crackles or wheezes.  Extremities: erythema faint continues to improve  Neuro:  Grossly symmetric, non-focal. Intact motor/sensation of legs.  Psych: Alert, oriented.

## 2022-11-20 NOTE — Unmapped (Signed)
Pt sleeping; VS stable; no significant changes through night.    Problem: Adult Inpatient Plan of Care  Goal: Plan of Care Review  Outcome: Progressing  Goal: Patient-Specific Goal (Individualized)  Outcome: Progressing  Goal: Absence of Hospital-Acquired Illness or Injury  Outcome: Progressing  Intervention: Identify and Manage Fall Risk  Recent Flowsheet Documentation  Taken 11/19/2022 2000 by Elouise Munroe, RN  Safety Interventions:   assistive device   lighting adjusted for tasks/safety   low bed   no IV/BP/blood draw left arm  Intervention: Prevent Skin Injury  Recent Flowsheet Documentation  Taken 11/19/2022 2000 by Elouise Munroe, RN  Positioning for Skin: Supine/Back  Device Skin Pressure Protection: adhesive use limited  Skin Protection: adhesive use limited  Intervention: Prevent and Manage VTE (Venous Thromboembolism) Risk  Recent Flowsheet Documentation  Taken 11/19/2022 2000 by Elouise Munroe, RN  Anti-Embolism Intervention: (heparin sq) Other (Comment)  Goal: Optimal Comfort and Wellbeing  Outcome: Progressing  Goal: Readiness for Transition of Care  Outcome: Progressing  Goal: Rounds/Family Conference  Outcome: Progressing     Problem: Fall Injury Risk  Goal: Absence of Fall and Fall-Related Injury  Outcome: Progressing  Intervention: Promote Injury-Free Environment  Recent Flowsheet Documentation  Taken 11/19/2022 2000 by Elouise Munroe, RN  Safety Interventions:   assistive device   lighting adjusted for tasks/safety   low bed   no IV/BP/blood draw left arm     Problem: Pain Acute  Goal: Optimal Pain Control and Function  Outcome: Progressing

## 2022-11-20 NOTE — Unmapped (Signed)
Tacrolimus Therapeutic Monitoring Pharmacy Note    Richard [name] is a 57 y.o. male continuing tacrolimus.     Indication: Liver transplant     Date of Transplant:  2013       Prior Dosing Information: Current regimen 1 mg BID      Source(s) of information used to determine prior to admission dosing: Home Medication List or Clinic Note    Goals:  Therapeutic Drug Levels  Tacrolimus trough goal:  2-4 ng/mL  (per 09/16/21 Clinic Note, most recent note Identified.    Additional Clinical Monitoring/Outcomes  Monitor renal function (SCr and urine output) and liver function (LFTs)  Monitor for signs/symptoms of adverse events (e.g., hyperglycemia, hyperkalemia, hypomagnesemia, hypertension, headache, tremor)    Results:   Tacrolimus level:  3.1 ng/mL, drawn appropriately    Pharmacokinetic Considerations and Significant Drug Interactions:  Concurrent hepatotoxic medications: Doxycycline (5/24.Marland Kitchen)  Concurrent CYP3A4 substrates/inhibitors: Amlodipine (homemed)  Concurrent nephrotoxic medications:   Furosemide 40mg  daily / Spironolactone 25mg  daily (HOME MEDS)  Vancomycin (DC'd 5/24, level still at - anticipate 2-3 days before below 83mcg/m))    Tacrolimus Dosing  Tacro 1mg  BID (5/23.Marland KitchenMarland KitchenMarland Kitchen)      Tacrolimus level  5/24 = 2.2 ng/mL at 0445, drawn ~6.75h after last dose      Assessment/Plan:   5/23 - mycophenolate 250mg  bid (HOLDING)   5/24 tacro level - drawn ~ midpoint, tru trough < goal    Recommendedation(s)  Continue current regimen of 1 mg BID    Follow-up  Tacrolimus Levels:   Daily with AM labs drawn between 0600 - 0800 PRIOR to morning dose.  Dose due 0800  A pharmacist will continue to monitor and recommend levels as appropriate    Please page service pharmacist with questions/clarifications.    Deanna Artis, PharmD

## 2022-11-20 NOTE — Unmapped (Signed)
Nephrology Consult Note    Requesting Attending Physician :  Richard Beckers, MD  Service Requesting Consult : Med General Richard [name] (MDU)  Reason for Consult: CKD, outpatient follow-up    Assessment and Plan:  Richard [name] is a 54M with history of liver transplant (2013) 2/2 NAFLD, HFpEF, CKD5, HTN and T2DM who presents with right leg pain concerning for cellulitis.    #AKI on CKD G5A3: Baseline Cr ~4.5/GFR 14. Follows with Dr. Galen [name] for general nephrology and Dr. Margaretmary [name] in the the pre-transplant high BMI clinic. His native kidney disease is due to biopsy-proven CNI nephropathy (biopsy done in 2020). Though this is also likely compounded by poorly controlled diabetes and HTN. He does have significant albuminuria, however unable to be on RASi or SGLT2i due to his low GFR. Though he presents with a more elevated creatinine, his GFR is only slightly down from his baseline. He does endorse poor PO intake for several days prior to admission so potentially a pre-renal component. Urine sed reviewed 5/26 with several granular casts. Per last clinic note with Dr. Galen [name], kidney replacement therapy was discussed however patient was hesitant to discuss and is hopeful for a transplant prior to needing dialysis.  - Dose meds for GFR<15  - Avoid further nephrotoxins including NSAIDS, Morphine. Unless absolutely necessary, avoid CT with contrast and/or MRI with gadolinium.     - Currently no acute indication for HD     #Hypertension/volume, improved: He states at home his blood pressure usually runs ~140-150/80s.  - Amlodipine 10 mg daily  - Carvedilol 6.25 mg BID  - Holding spiro 25 mg daily    #T2DM, uncontrolled: Last A1c 8.3% (08/04/22). On insulin at home.     #S/p OLT (2013): Due to NAFLD. On Cellcept and tac at home, will defer dosing to transplant hepatology.     #Right leg pain, concern for cellulitis  - Linezolid, cefepime  - Bcx negative  - Evaluation and management per primary team  - No changes to management from a nephrology standpoint at this time    RECOMMENDATIONS:   - Continue to hold spiro   - Would continue to hold diuretics from our perspective today as does not appear overtly volume overloaded  - Patient has planned follow-up with Dr. Margaretmary [name] on 6/3 and Dr. Galen [name] on 6/28  - He has no acute indication for renal replacement therapy at this time, denies symptoms of uremia  - We will continue to follow     Richard [name], MD  11/20/2022 7:24 AM     Medical decision-making for 11/20/22  Findings / Data     Patient has: []  acute illness w/systemic sxs  [mod]  []  two or more stable chronic illnesses [mod]  []  one chronic illness with acute exacerbation [mod]  []  acute complicated illness  [mod]  []  Undiagnosed new problem with uncertain prognosis  [mod] [x]  illness posing risk to life or bodily function (ex. AKI)  [high]  [x]  chronic illness with severe exacerbation/progression  [high]  []  chronic illness with severe side effects of treatment  [high] AKI on CKD5, hyperglycemia Probs At least 2:  Probs, Data, Risk   I reviewed: [x]  primary team note  []  consultant note(s)  []  external records [x]  chemistry results  []  CBC results  []  blood gas results  []  Other []  procedure/op note(s)   []  radiology report(s)  []  micro result(s)  []  w/ independent historian(s) Reviewed outpatient records, reviewed primary team notes, Labs pending ?3 Data Review (  2 of 3)    I independently interpreted: []  Urine Sediment  []  Renal US []  CXR Images  []  CT Images  []  Other []  EKG Tracing  Any     I discussed: []  Pathology results w/ QHPs(s) from other specialties  []  Procedural findings w/ QHPs(s) from other specialties []  Imaging w/ QHP(s) from other specialties  [x]  Treatment plan w/ QHP(s) from other specialties Plan discussed with primary team Any     Mgm't requires: []  Prescription drug(s)  [mod]  []  Kidney biopsy  [mod]  []  Central line placement  [mod] [x]  High risk medication use and/or intensive toxicity monitoring [high]  []  Renal replacement therapy [high]  []  High risk kidney biopsy  [high]  []  Escalation of care  [high]  []  High risk central line placement  [high] Close lab monitoring  Hold diuresis Risk      ____________________________________________________  Interval Hx:  No acute events overnight. Awaiting MRCP.      Physical Exam:   Vitals:    11/18/22 2346 11/19/22 0826 11/19/22 1556 11/19/22 2040   BP: 128/57 142/84 146/71 134/65   Pulse: 76 72 68 73   Resp: 26 28 24 22    Temp: 37.2 ??C (99 ??F) 37.2 ??C (99 ??F) 37.4 ??C (99.3 ??F) 37.2 ??C (99 ??F)   TempSrc: Oral Oral Oral Oral   SpO2:  95% 96% 100%   Weight:       Height:         No intake/output data recorded.    Intake/Output Summary (Last 24 hours) at 11/20/2022 0724  Last data filed at 11/19/2022 2000  Gross per 24 hour   Intake 240 ml   Output 300 ml   Net -60 ml     Constitutional: chronically ill appearing, NAD  Heart: RRR,  Lungs: normal wob, on room air  Abd: soft, slightly distended  Ext: no significant edema

## 2022-11-20 NOTE — Unmapped (Signed)
Nephrology Consult Note    Requesting Attending Physician :  Zollie Beckers, MD  Service Requesting Consult : Med General Dor[facility] Counter ([facility])  Reason for Consult: CKD, outpatient follow-up    Assessment and Plan:  Richard [name] is a 82M with history of liver transplant (2013) 2/2 NAFLD, HFpEF, CKD5, HTN and T2DM who presents with right leg pain concerning for cellulitis.    #AKI on CKD G5A3: Baseline Cr ~4.5/GFR 14. Follows with Dr. Galen Manila for general nephrology and Dr. Margaretmary Bayley in the the pre-transplant high BMI clinic. His native kidney disease is due to biopsy-proven CNI nephropathy (biopsy done in 2020). Though this is also likely compounded by poorly controlled diabetes and HTN. He does have significant albuminuria, however unable to be on RASi or SGLT2i due to his low GFR. Though he presents with a more elevated creatinine of 5.8, his GFR is only slightly down to 11. He does endorse poor PO intake for several days so potentially a pre-renal component. Considered injury from elevated tac levels, but tac trough at 2.2. He denies symptoms of uremia. No electrolytes abnormalities or metabolic acidosis that would warrant acute dialysis. Per last clinic note with Dr. Galen Manila, kidney replacement therapy was discussed however patient was hesitant to discuss and is hopeful for a transplant prior to needing dialysis.  - Dose meds for GFR<15  - Avoid further nephrotoxins including NSAIDS, Morphine. Unless absolutely necessary, avoid CT with contrast and/or MRI with gadolinium.     - Currently no acute indication for HD  - would hold off on continued diuresis given pt is euvolemic at this time and Urine sediment with fine granular casts.    - eGFR not significantly changed.     #Hypertension/volume, uncontrolled: SBP ~200s this AM, but per patient was in setting of leg pain. He states at home his blood pressure usually runs ~140-150/80s. Outpatient regimen: amlodipine 5 mg daily, carvedilol 6.25 mg BID, spiro 25 mg daily and Lasix 40 mg daily.    #T2DM, uncontrolled: Last A1c 8.3% (08/04/22). BG currently in 400s this AM. On insulin at home.     #S/p OLT (2013): Due to NAFLD. On Cellcept and tac at home, will defer dosing to transplant hepatology.     #Right leg pain, concern for cellulitis  - IV vancomycin   - Evaluation and management per primary team  - No changes to management from a nephrology standpoint at this time    RECOMMENDATIONS:   - given urine sediment with granular casts and pt appearing euvolemic would hold off on diuresis for 1-2days   - Patient has planned follow-up with Dr. Margaretmary Bayley on 6/3 and Dr. Galen Manila on 6/28  - He has no acute indication for renal replacement therapy at this time, denies symptoms of uremia     Danae Orleans, MD  11/20/2022 6:40 AM     Medical decision-making for 11/20/22  Findings / Data     Patient has: []  acute illness w/systemic sxs  [mod]  []  two or more stable chronic illnesses [mod]  []  one chronic illness with acute exacerbation [mod]  []  acute complicated illness  [mod]  []  Undiagnosed new problem with uncertain prognosis  [mod] [x]  illness posing risk to life or bodily function (ex. AKI)  [high]  [x]  chronic illness with severe exacerbation/progression  [high]  []  chronic illness with severe side effects of treatment  [high] AKI on CKD5, hyperglycemia Probs At least 2:  Probs, Data, Risk   I reviewed: [x]  primary team note  []   consultant note(s)  []  external records [x]  chemistry results  []  CBC results  []  blood gas results  []  Other []  procedure/op note(s)   []  radiology report(s)  []  micro result(s)  []  w/ independent historian(s) Reviewed outpatient records, reviewed primary team notes, Cr elevated but lytes stable ?3 Data Review (2 of 3)    I independently interpreted: []  Urine Sediment  []  Renal US []  CXR Images  []  CT Images  []  Other []  EKG Tracing  Any     I discussed: []  Pathology results w/ QHPs(s) from other specialties  []  Procedural findings w/ QHPs(s) from other specialties []  Imaging w/ QHP(s) from other specialties  [x]  Treatment plan w/ QHP(s) from other specialties Plan discussed with primary team Any     Mgm't requires: []  Prescription drug(s)  [mod]  []  Kidney biopsy  [mod]  []  Central line placement  [mod] [x]  High risk medication use and/or intensive toxicity monitoring [high]  []  Renal replacement therapy [high]  []  High risk kidney biopsy  [high]  []  Escalation of care  [high]  []  High risk central line placement  [high] Close lab monitoring  Hold diuresis Risk      ____________________________________________________  Interval Hx:  Pt feeling well. States he needs sedation for MRI. Tolerating PO     History of Present Illness: Richard [name] is 57 y.o. male with liver transplant (2013) 2/2 NAFLD, HFpEF, CKD5, HTN and T2DM who is seen in consultation at the request of Zollie Beckers, MD and Med General Doristine Counter ([facility]). Nephrology has been consulted for AKI,CKD.     Patient presents with right leg pain and swelling concerning for cellulitis. States he started having pain and swelling yesterday. No fevers, but endorses some chills. No night sweats. No SOB or chest pain. No abd pain. Some nausea, but no emesis. States he's had poor PO intake for several days, but then states he has tried to stay hydrated). He notes he has been seen in the high BMI pre-transplant clinic to continue working on weight loss in hopes of being listed for kidney transplant. States he overall feels well though. Notes his BP was high this AM because his right leg pain had worsened. His BP at home is typically ~140-150s/80s. Denies symptoms of uremia such as metallic taste in his mouth or itching.     Physical Exam:   Vitals:    11/18/22 2346 11/19/22 0826 11/19/22 1556 11/19/22 2040   BP: 128/57 142/84 146/71 134/65   Pulse: 76 72 68 73   Resp: 26 28 24 22    Temp: 37.2 ??C (99 ??F) 37.2 ??C (99 ??F) 37.4 ??C (99.3 ??F) 37.2 ??C (99 ??F)   TempSrc: Oral Oral Oral Oral   SpO2:  95% 96% 100%   Weight: Height:         I/O this shift:  In: -   Out: 150 [Urine:150]    Intake/Output Summary (Last 24 hours) at 11/20/2022 0640  Last data filed at 11/19/2022 2000  Gross per 24 hour   Intake 240 ml   Output 300 ml   Net -60 ml     Constitutional: well appearing, fidgety   Heart: RRR, no m/r/g  Lungs: CTAB, normal wob  Abd: soft, slightly distended  Ext: no significant edema   Skin: Warm local swelling of medial side of calf

## 2022-11-21 LAB — MAGNESIUM
MAGNESIUM: 2.6 mg/dL (ref 1.6–2.6)
MAGNESIUM: 2.4 mg/dL (ref 1.6–2.6)

## 2022-11-21 LAB — BASIC METABOLIC PANEL
ANION GAP: 12 mmol/L (ref 5–14)
BLOOD UREA NITROGEN: 86 mg/dL — ABNORMAL HIGH (ref 9–23)
BUN / CREAT RATIO: 11
CALCIUM: 8.4 mg/dL — ABNORMAL LOW (ref 8.7–10.4)
CHLORIDE: 101 mmol/L (ref 98–107)
CO2: 22 mmol/L (ref 20.0–31.0)
CREATININE: 7.64 mg/dL — ABNORMAL HIGH
EGFR CKD-EPI (2021) MALE: 8 mL/min/{1.73_m2} — ABNORMAL LOW (ref >=60)
GLUCOSE RANDOM: 232 mg/dL — ABNORMAL HIGH (ref 70–179)
POTASSIUM: 3.7 mmol/L (ref 3.4–4.8)
SODIUM: 135 mmol/L (ref 135–145)

## 2022-11-21 LAB — HEPATIC FUNCTION PANEL
ALBUMIN: 2.5 g/dL — ABNORMAL LOW (ref 3.4–5.0)
ALKALINE PHOSPHATASE: 209 U/L — ABNORMAL HIGH (ref 46–116)
ALT (SGPT): 66 U/L — ABNORMAL HIGH (ref 10–49)
AST (SGOT): 43 U/L — ABNORMAL HIGH (ref <=34)
BILIRUBIN DIRECT: 0.3 mg/dL (ref 0.00–0.30)
BILIRUBIN TOTAL: 0.6 mg/dL (ref 0.3–1.2)
PROTEIN TOTAL: 6.6 g/dL (ref 5.7–8.2)

## 2022-11-21 LAB — TACROLIMUS LEVEL, TROUGH: TACROLIMUS, TROUGH: 3.7 ng/mL — ABNORMAL LOW (ref 5.0–15.0)

## 2022-11-21 LAB — CBC
HEMATOCRIT: 29.1 % — ABNORMAL LOW (ref 39.0–48.0)
HEMOGLOBIN: 10 g/dL — ABNORMAL LOW (ref 12.9–16.5)
MEAN CORPUSCULAR HEMOGLOBIN CONC: 34.4 g/dL (ref 32.0–36.0)
MEAN CORPUSCULAR HEMOGLOBIN: 28.6 pg (ref 25.9–32.4)
MEAN CORPUSCULAR VOLUME: 83.1 fL (ref 77.6–95.7)
MEAN PLATELET VOLUME: 8.4 fL (ref 6.8–10.7)
PLATELET COUNT: 209 10*9/L (ref 150–450)
RED BLOOD CELL COUNT: 3.51 10*12/L — ABNORMAL LOW (ref 4.26–5.60)
RED CELL DISTRIBUTION WIDTH: 14.6 % (ref 12.2–15.2)
WBC ADJUSTED: 8.5 10*9/L (ref 3.6–11.2)

## 2022-11-21 LAB — PHOSPHORUS: PHOSPHORUS: 8.7 mg/dL — ABNORMAL HIGH (ref 2.4–5.1)

## 2022-11-21 MED ADMIN — pantoprazole (Protonix) EC tablet 40 mg: 40 mg | ORAL | @ 13:00:00

## 2022-11-21 MED ADMIN — insulin lispro (HumaLOG) injection 45 Units: 45 [IU] | SUBCUTANEOUS | @ 13:00:00

## 2022-11-21 MED ADMIN — HYDROmorphone (PF) injection Syrg 0.5 mg: .5 mg | INTRAVENOUS | @ 02:00:00 | Stop: 2022-11-20

## 2022-11-21 MED ADMIN — HYDROmorphone (DILAUDID) tablet 4 mg: 4 mg | ORAL | @ 22:00:00 | Stop: 2022-12-05

## 2022-11-21 MED ADMIN — HYDROmorphone (PF) injection Syrg 0.5 mg: .5 mg | INTRAVENOUS | @ 13:00:00 | Stop: 2022-11-21

## 2022-11-21 MED ADMIN — HYDROmorphone (PF) injection Syrg 0.5 mg: .5 mg | INTRAVENOUS | @ 04:00:00 | Stop: 2022-11-21

## 2022-11-21 MED ADMIN — cefepime (MAXIPIME) 1 g in sodium chloride 0.9 % (NS) 100 mL IVPB-MBP: 1 g | INTRAVENOUS | @ 02:00:00 | Stop: 2022-11-21

## 2022-11-21 MED ADMIN — carvedilol (COREG) tablet 6.25 mg: 6.25 mg | ORAL | @ 13:00:00

## 2022-11-21 MED ADMIN — heparin (porcine) 5,000 unit/mL injection 7,500 Units: 7500 [IU] | SUBCUTANEOUS | @ 11:00:00

## 2022-11-21 MED ADMIN — cefepime (MAXIPIME) 1 g in sodium chloride 0.9 % (NS) 100 mL IVPB-MBP: 1 g | INTRAVENOUS | @ 13:00:00 | Stop: 2022-11-21

## 2022-11-21 MED ADMIN — insulin lispro (HumaLOG) injection 0-20 Units: 0-20 [IU] | SUBCUTANEOUS | @ 16:00:00

## 2022-11-21 MED ADMIN — tacrolimus (PROGRAF) capsule 1 mg: 1 mg | ORAL | @ 01:00:00

## 2022-11-21 MED ADMIN — heparin (porcine) 5,000 unit/mL injection 7,500 Units: 7500 [IU] | SUBCUTANEOUS | @ 02:00:00

## 2022-11-21 MED ADMIN — oxyCODONE (ROXICODONE) immediate release tablet 10 mg: 10 mg | ORAL | @ 11:00:00 | Stop: 2022-11-21

## 2022-11-21 MED ADMIN — insulin lispro (HumaLOG) injection 0-20 Units: 0-20 [IU] | SUBCUTANEOUS | @ 13:00:00

## 2022-11-21 MED ADMIN — potassium chloride ER tablet 40 mEq: 40 meq | ORAL | @ 04:00:00 | Stop: 2022-11-21

## 2022-11-21 MED ADMIN — insulin glargine (LANTUS) injection 18 Units: 18 [IU] | SUBCUTANEOUS | @ 03:00:00

## 2022-11-21 MED ADMIN — diclofenac sodium (VOLTAREN) 1 % gel 2 g: 2 g | TOPICAL | @ 21:00:00

## 2022-11-21 MED ADMIN — insulin lispro (HumaLOG) injection 45 Units: 45 [IU] | SUBCUTANEOUS | @ 16:00:00

## 2022-11-21 MED ADMIN — furosemide (LASIX) injection 80 mg: 80 mg | INTRAVENOUS | @ 02:00:00 | Stop: 2022-11-20

## 2022-11-21 MED ADMIN — mycophenolate (CELLCEPT) capsule 250 mg: 250 mg | ORAL | @ 01:00:00

## 2022-11-21 MED ADMIN — heparin (porcine) 5,000 unit/mL injection 7,500 Units: 7500 [IU] | SUBCUTANEOUS | @ 19:00:00

## 2022-11-21 MED ADMIN — linezolid (ZYVOX) tablet 600 mg: 600 mg | ORAL | @ 01:00:00 | Stop: 2022-11-25

## 2022-11-21 MED ADMIN — furosemide (LASIX) 10 mg/mL injection: INTRAVENOUS | @ 02:00:00 | Stop: 2022-11-20

## 2022-11-21 MED ADMIN — torsemide (DEMADEX) tablet 40 mg: 40 mg | ORAL | @ 16:00:00

## 2022-11-21 MED ADMIN — tacrolimus (PROGRAF) capsule 1 mg: 1 mg | ORAL | @ 13:00:00

## 2022-11-21 MED ADMIN — amlodipine (NORVASC) tablet 10 mg: 10 mg | ORAL | @ 13:00:00

## 2022-11-21 MED ADMIN — carvedilol (COREG) tablet 6.25 mg: 6.25 mg | ORAL | @ 01:00:00

## 2022-11-21 MED ADMIN — insulin lispro (HumaLOG) injection 45 Units: 45 [IU] | SUBCUTANEOUS | @ 21:00:00

## 2022-11-21 MED ADMIN — linezolid (ZYVOX) tablet 600 mg: 600 mg | ORAL | @ 13:00:00 | Stop: 2022-11-25

## 2022-11-21 MED ADMIN — mycophenolate (CELLCEPT) capsule 250 mg: 250 mg | ORAL | @ 13:00:00

## 2022-11-21 MED ADMIN — HYDROmorphone (PF) injection Syrg 0.25 mg: .25 mg | INTRAVENOUS | @ 21:00:00 | Stop: 2022-11-21

## 2022-11-21 MED ADMIN — HYDROmorphone (DILAUDID) tablet 2 mg: 2 mg | ORAL | @ 19:00:00 | Stop: 2022-11-21

## 2022-11-21 MED ADMIN — oxyCODONE (ROXICODONE) immediate release tablet 10 mg: 10 mg | ORAL | @ 06:00:00 | Stop: 2022-11-21

## 2022-11-21 NOTE — Unmapped (Signed)
Internal Medicine ([facility]) Progress Note    Assessment & Plan:   Richard [name] is a 57 y.o. male with PMH NASH cirrhosis s/p OLT 2013, HFpEF, CKD5, persistent A-fib not on AC, CAD, HTN, OSA not on CPAP who presented to [facility] 5/22 with RLE cellulitis, fount to have an AKI on CKD and elevated LFTs. Continues to have poor recovery of renal function, recently with new 2L O2 requirement iso volume overload.     Principal Problem:    Cellulitis of right lower extremity  Active Problems:    Hypertension    Liver replaced by transplant (CMS-HCC)    Immunosuppression due to drug therapy (CMS-HCC)    CKD (chronic kidney disease) stage 4, GFR 15-29 ml/min (CMS-HCC)    (HFpEF) heart failure with preserved ejection fraction (CMS-HCC)    Severe hyperglycemia due to diabetes mellitus (CMS-HCC)    Acute kidney injury superimposed on chronic kidney disease (CMS-HCC)    CAD (coronary artery disease)    Elevated liver enzymes    Hypokalemia  Resolved Problems:    Hypomagnesemia    Active Problems    AKI on CKD G5A3  Baseline Cr 4.6-4.8. Follows with Nephrology (Dr. Galen Manila) and Transplant Nephrology (Dr. Margaretmary Bayley). 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 Cr elevated on admission at 5.33, continues to rise (7.46 5/27), but UOP remains appopriate. Differential includes prerenal iso poor po intake vs V overload, intrarenal from tac. Nephrology following, patient appears euvolemic, urine with granular casts, and Cr not improving. UA and renal US unremarkable. Pt had SOB on 5/27 night and was put on 2L [place] and IV lasix 80 mg which helped with diuresis. CXR showed new mild pulmonary edema. Nephrology cleared to have pt on his home dose of torsemide 40 mg daily. Potassium stable in 3.2-3.5 range. Will continue to hold spironolactone.   - Nephro following, appreciate recs  - Continue to follow transplant clinic (scheduled on 6/3) and nephrology (6/28)  - Restart home PO torsemide 40 mg daily  - Hold Spironolactone iso AKI  - Avoid NSAIDs and Morphine   - Vanc level     Elevated LFTs, stable - s/p Liver Transplant in 2013  Hx of liver transplant 11 years ago 2/2 NASH. On Cellcept and Tacrolimus. LFTs elevated on admission, differential includes congestive hepatopathy, Dili, ischemic liver injury, rejection.  LFTs initially improved diuresis, but worsened 5/26. Pt developed SOB and was put on diuresis, and his AST/ALT is back on downtrend. Cleared by nephro for home toresemide 40 mg due to pulmonary edema. Would ideally like to obtain MRCP this patient to rule out biliary pathology, he is status post cholecystectomy.   - Hepatology following, appreciate recs   - non-con MRCP, will need sedated as he had panic attack with last attempt   - daily HFP  - Cont home Cellcept   - Tac (goal 2-4), dosing per pharmacy  - Hold Tylenol    RLE Cellulitis, improved  Patient with multiple episodes of cellulitis, presented with right lower extremity cellulitis. Patient initially doing well but fevered with transition from vancomycin to doxycycline, broadened to linezolid and cefepime to provide MRSA and pseudomonal coverage. Patient remains afebrile and hemodynamically stable. WBC in normal range. The area with cellulitis is not erythematous or locally swollen anymore, but pt endorses hardness and tenderness to palpation compared to other parts of the leg. Both legs also look mildly swollen compared to a couple days prior, but possibly due to overall fluid retention the pt  is experiencing. Would like to POCUS to rule out any local fluid cyst, leakage, or abscess.   - Bcx 5/22: [id number]  - Bcx 5/24: [id number]   - Antibiotics:    - s/p Clindamycin (5/22)   - s/p Vancomycin (5/23)   - s/p Doxycycline (5/24 - 5/25)   - s/p Ceftriaxone (5/25)   - Linezolid (5/25 - 5/31)   - Cefepime (5/25 - 28), transitioned to PO cipro 5/28 - renally dosed  - trend CBC daily  - [place] of RLE, bedside    T2DM  Latest A1c 8.3% on 08/04/2022. Pt uses a CGM and endorses well controlled BG with Levemir and Novolog at home. Insulin regimen during admission has lowered his BG but still in 200's. Will titrate up the dose.  - Increase Lantus 18u --> 20u Nightly  - Humalog from 45u TIDAC (home 55u)  - SSI  - POCT BG QID    Hypoxemia  Pt had a rapid response event last night. Pt coughed and couldn't catch breath while eating dinner. CXR showed new mild pulmonary edema and bibasilar atelectasis. Pt was treated with IV lasix 80 mg and had improvement with breathing. This morning, pt denied SOB but O2 still desaturates to 88% without 2L nasal [place]ula. Pt is continued on O2. Exam without crackles. Nephro cleared to continue home torsemide 40 mg daily.   - Restart Torsemide 40 mg daily  - Wean O2 as tolerated; consider spirometer if no improvement    HFpEF - Hx of A-fib, CAD  Discussed with nephrology, they believe patient is euvolemic. Plan was to hold lasix and spironolactone in the setting of worsening renal function but, nephro cleared to restart home torsemide due to newly developed pulmonary edema and SOB. Pt remains rate controlled.  - Hold spironolactone   - Continue carvedilol 6.25 mg BID    Hypokalemia, stable - Hypomagnesemia, resolved  Monitoring lytes and replenishing cautiously given AKI on CKD. Requests IV potassium when needs repletion.  - Goal K 3.6, Mg 2   - daily BMP, Mg  - 1600 BMP and Mg s/p lasix     Chronic Conditions  Hypertension: Continue amlodipine 10 mg, Coreg 6.25 mg twice daily. Also restarted torsemide 40 mg daily on 5/28 due to fluid overload.      Daily Checklist:  Diet: Regular Diet  DVT PPx:  heparin SQ  Electrolytes:     caution with repletion given AKI on CKD  Code Status: Full Code  Dispo: Home pending improvement in AKI and LFTs  Team Contact Information:   Primary Team: Internal Medicine ([facility])  Primary Resident: Bo Mcclintock MS3, Vernie Ammons, PGY2 and Modena Slater, PGY1  Resident's Pager: [phone] (Gen [facility] Intern - Alvester Morin)    Interval History:   Pt developed SOB last night w/ CXR revealing new pulmonary edema and bibasilar atelectasis. Pt was treated with 2L [place] and IV lasix 80 mg which resolved sx. Pt denies SOB this AM but desaturating to 88% O2 without 2L [place]. Lungs clear bilaterally.    All other systems were reviewed and are negative except as noted in the HPI    Objective:   Temp:  [36.7 ??C (98.1 ??F)-37.4 ??C (99.3 ??F)] 36.7 ??C (98.1 ??F)  Heart Rate:  [62-78] 66  Resp:  [22-37] (P) 24  BP: (129-158)/(74-83) 135/74  SpO2:  [84 %-96 %] 96 %    Gen: NAD, converses normally.  HENT: Atraumatic, normocephalic.  Heart: Irregularly irregular. Normal rate. S3. No murmur/thrills/gallops.  Lungs: CTAB, no crackles or wheezes.  Extremities: Erythema faint, continues to improve. Tender to palpation over the area with cellulitis. 1+ pitting pedal edema.  Neuro:  Grossly symmetric, non-focal. Intact motor/sensation of legs.  Psych: Alert, oriented.      [name], MS3    I attest that I have reviewed the medical student note and that the components of the history of the present illness, the physical exam, and the assessment and plan documented were performed by me or were performed in my presence by the student where I verified the documentation and performed (or re-performed) the exam and medical decision making. Barnett Abu, MD

## 2022-11-21 NOTE — Unmapped (Signed)
Hepatology Consult Service   Treatment Plan         Recommendations:   Richard [name] is a 57 y.o. male who presented to [facility] with stage 4 CKD, insulin-dependent T2DM, HFpEF, HTN, persistent A-fib, CAD, OSA, and NASH s/p OLT 2013 who presented to [facility] with right leg pain and swelling FTH cellulitis. We have been following for elevated liver tests.    As previously documented in initial consult note, broad ddx for liver tests abnormalities. It may be that patient developed chronic liver disease of transplanted liver (likely metabolic associated) in context of metabolic disease and may have liver tests that increase when admitted with hypertension or infection. His tests have been elevated last year in that setting and this admission, but have normalized while he has been outpatient. His liver tests have improved some. Still think getting MRCP is reasonable to exclude any process there given ultrasound findings and team is working on getting that done with sedation. Feel free to call us back if that has concerning findings, but will sign off for now. Otherwise, will plan for outpatient hepatologist to recheck labs and consider further w/u if still elevated in that setting.    Thank you for involving [place] in the care of your patient. We will sign-off at this time, please re-contact if additional questions or a new need for consultation arises.

## 2022-11-21 NOTE — Unmapped (Signed)
A/O x 4, afebrile, VSS on RA, denies pain at this time. IV clean, dry, intact, and flushed per protocol. BS been checked. Pt performs ADLs with stand by assist.  Falls precautions maintained. Call bell and bedside table within reach, bed in lowest position and locked. No falls or injuries noted this shift. Will continue with plan of care.    Problem: Adult Inpatient Plan of Care  Goal: Plan of Care Review  Outcome: Progressing  Flowsheets (Taken 11/20/2022 1755)  Progress: improving  Plan of Care Reviewed With: patient  Goal: Patient-Specific Goal (Individualized)  Outcome: Progressing  Flowsheets (Taken 11/20/2022 1755)  Patient/Family-Specific Goals (Include Timeframe): Pt will remain free from falls or injuries through discharge  Individualized Care Needs: Monitor labs, vs, pain management, iv antibx, achs. falls prec.  Anxieties, Fears or Concerns: none voiced  Goal: Absence of Hospital-Acquired Illness or Injury  Outcome: Progressing  Intervention: Prevent and Manage VTE (Venous Thromboembolism) Risk  Recent Flowsheet Documentation  Taken 11/20/2022 1600 by Lonia Mad, RN  Anti-Embolism Intervention: Other (Comment)  Taken 11/20/2022 1400 by Lonia Mad, RN  Anti-Embolism Intervention: Other (Comment)  Taken 11/20/2022 1000 by Lonia Mad, RN  Anti-Embolism Intervention: Other (Comment)  Taken 11/20/2022 0800 by Lonia Mad, RN  Anti-Embolism Intervention: Other (Comment)  Goal: Optimal Comfort and Wellbeing  Outcome: Progressing  Goal: Readiness for Transition of Care  Outcome: Progressing  Goal: Rounds/Family Conference  Outcome: Progressing     Problem: Fall Injury Risk  Goal: Absence of Fall and Fall-Related Injury  Outcome: Progressing     Problem: Pain Acute  Goal: Optimal Pain Control and Function  Outcome: Progressing

## 2022-11-21 NOTE — Unmapped (Signed)
Nephrology Consult Note    Requesting Attending Physician :  Richard Beckers, MD  Service Requesting Consult : Med General Richard [name] ([facility])  Reason for Consult: CKD, outpatient follow-up    Assessment and Plan:  Richard [name] is a 46M with history of liver transplant (2013) 2/2 NAFLD, HFpEF, CKD5, HTN and T2DM who presents with right leg pain concerning for cellulitis.    #AKI on CKD G5A3: Baseline Cr ~4.5/GFR 14. Follows with Dr. Galen [name] for general nephrology and Dr. Margaretmary [name] in the the pre-transplant high BMI clinic. His native kidney disease is due to biopsy-proven CNI nephropathy (biopsy done in 2020). Though this is also likely compounded by poorly controlled diabetes and HTN. He does have significant albuminuria, however unable to be on RASi or SGLT2i due to his low GFR.  Urine sed reviewed 5/26 with several granular casts. Suspect patient has very fragile hemodynamics with his liver disease and this is likely contributing to his fluctuating kidney function.  - He did have rapid response called overnight for SOB and suspect this was multifactorial from worsening pulmonary edema, noted afib and worsening RLE pain  - Will plan to start PO torsemide for volume control  - Dose meds for GFR<15  - Avoid further nephrotoxins including NSAIDS, Morphine. Unless absolutely necessary, avoid CT with contrast and/or MRI with gadolinium.     - Currently no acute indication for HD   - Planned follow-up with Dr. Margaretmary [name] on 6/3 and Dr. Galen [name] on 6/28    #Hypertension/volume, improved: He states at home his blood pressure usually runs ~140-150/80s.  - Amlodipine 10 mg daily  - Carvedilol 6.25 mg BID  - Holding spiro 25 mg daily    #T2DM, uncontrolled: Last A1c 8.3% (08/04/22). On insulin at home.     #S/p OLT (2013): Due to NAFLD. On Cellcept and tac at home, will defer dosing to transplant hepatology.     #Right leg pain, concern for cellulitis  - Linezolid, cefepime  - Bcx negative  - Evaluation and management per primary team  - No changes to management from a nephrology standpoint at this time    RECOMMENDATIONS:   - Continue to hold spiro   - Can start PO torsemide 40 mg daily  - We had long discussion with patient today about our concerns that he will likely need dialysis in the near future  - We will need to start prepping for HD, please order vein mapping studies and I will reach out to Dr. Darlin [name] about AVF scheduling (this should not hold him up from discharge though)  - He has no acute indication for renal replacement therapy currently, but we will continue to assess daily  - Please order ultrasound of patient's RLE given fluctuant area on calf concerning for evolving abscess  - We will continue to follow     Richard [name], MD  11/21/2022 7:33 AM     Medical decision-making for 11/21/22  Findings / Data     Patient has: []  acute illness w/systemic sxs  [mod]  []  two or more stable chronic illnesses [mod]  []  one chronic illness with acute exacerbation [mod]  []  acute complicated illness  [mod]  []  Undiagnosed new problem with uncertain prognosis  [mod] [x]  illness posing risk to life or bodily function (ex. AKI)  [high]  [x]  chronic illness with severe exacerbation/progression  [high]  []  chronic illness with severe side effects of treatment  [high] AKI on CKD5, hyperglycemia Probs At least 2:  Probs, Data, Risk  I reviewed: [x]  primary team note  []  consultant note(s)  []  external records [x]  chemistry results  []  CBC results  []  blood gas results  []  Other []  procedure/op note(s)   []  radiology report(s)  []  micro result(s)  []  w/ independent historian(s) Reviewed outpatient records, reviewed primary team notes, Cr stably elevated ?3 Data Review (2 of 3)    I independently interpreted: []  Urine Sediment  []  Renal US []  CXR Images  []  CT Images  []  Other []  EKG Tracing  Any     I discussed: []  Pathology results w/ QHPs(s) from other specialties  []  Procedural findings w/ QHPs(s) from other specialties []  Imaging w/ QHP(s) from other specialties  [x]  Treatment plan w/ QHP(s) from other specialties Plan discussed with primary team Any     Mgm't requires: []  Prescription drug(s)  [mod]  []  Kidney biopsy  [mod]  []  Central line placement  [mod] [x]  High risk medication use and/or intensive toxicity monitoring [high]  []  Renal replacement therapy [high]  []  High risk kidney biopsy  [high]  []  Escalation of care  [high]  []  High risk central line placement  [high] Close lab monitoring  Diuresis Risk      ____________________________________________________  Interval Hx:  RRT called overnight for increased WOB. CXR obtained and showed mild pulmonary edema. Tele also noted patient in new afib. S/p IV lasix 80 mg x1.       Physical Exam:   Vitals:    11/20/22 2148 11/20/22 2227 11/20/22 2340 11/21/22 0100   BP:  158/76     Pulse:  78  67   Resp:       Temp:       TempSrc:       SpO2: 95%  (S) (!) 84% 95%   Weight:       Height:         No intake/output data recorded.    Intake/Output Summary (Last 24 hours) at 11/21/2022 0733  Last data filed at 11/21/2022 0658  Gross per 24 hour   Intake 650 ml   Output 825 ml   Net -175 ml     Constitutional: chronically ill appearing, NAD, intermittently tearful  Heart: RRR,  Lungs: normal wob, satting well on 2L [place]  Abd: soft, slightly distended  Ext: no significant edema, slightly fluctuant warmth on right calf

## 2022-11-21 NOTE — Unmapped (Signed)
Richard [name]&Ox4. C/o pain throughout shift, escalated to Dr. Tobias Alexander as needed, pain medication given per [facility].     Rapid called overnight. Richard experienced increased coughing while eating dinner, could not catch breath in the next few hours. This nurse let Dr. Tobias Alexander know and rapid was called. IV lasix given after tests/scans finished. Richard stated later in shift that it was [name] little bit easier to breathe now. Tele and cont. O2 on patient. Richard on 2 L. Richard requested to take off O2 [place] several times during shift, attempted at 0645 but Richard desated to 88%.     Problem: Adult Inpatient Plan of Care  Goal: Plan of Care Review  Outcome: Ongoing - Unchanged  Goal: Patient-Specific Goal (Individualized)  Outcome: Ongoing - Unchanged  Goal: Absence of Hospital-Acquired Illness or Injury  Outcome: Ongoing - Unchanged  Intervention: Prevent Skin Injury  Recent Flowsheet Documentation  Taken 11/20/2022 2055 by Cecille Rubin, RN  Positioning for Skin: Supine/Back  Device Skin Pressure Protection: adhesive use limited  Skin Protection: adhesive use limited  Intervention: Prevent and Manage VTE (Venous Thromboembolism) Risk  Recent Flowsheet Documentation  Taken 11/20/2022 2055 by Cecille Rubin, RN  VTE Prevention/Management: anticoagulant therapy  Goal: Optimal Comfort and Wellbeing  Outcome: Ongoing - Unchanged  Goal: Readiness for Transition of Care  Outcome: Ongoing - Unchanged  Goal: Rounds/Family Conference  Outcome: Ongoing - Unchanged     Problem: Fall Injury Risk  Goal: Absence of Fall and Fall-Related Injury  Outcome: Ongoing - Unchanged     Problem: Pain Acute  Goal: Optimal Pain Control and Function  Outcome: Ongoing - Unchanged

## 2022-11-22 LAB — HEPATIC FUNCTION PANEL
ALBUMIN: 2.7 g/dL — ABNORMAL LOW (ref 3.4–5.0)
ALKALINE PHOSPHATASE: 272 U/L — ABNORMAL HIGH (ref 46–116)
ALT (SGPT): 60 U/L — ABNORMAL HIGH (ref 10–49)
AST (SGOT): 55 U/L — ABNORMAL HIGH (ref <=34)
BILIRUBIN DIRECT: 0.3 mg/dL (ref 0.00–0.30)
BILIRUBIN TOTAL: 0.5 mg/dL (ref 0.3–1.2)
PROTEIN TOTAL: 6.7 g/dL (ref 5.7–8.2)

## 2022-11-22 LAB — BASIC METABOLIC PANEL
ANION GAP: 13 mmol/L (ref 5–14)
BLOOD UREA NITROGEN: 83 mg/dL — ABNORMAL HIGH (ref 9–23)
BUN / CREAT RATIO: 10
CALCIUM: 8.6 mg/dL — ABNORMAL LOW (ref 8.7–10.4)
CHLORIDE: 101 mmol/L (ref 98–107)
CO2: 21 mmol/L (ref 20.0–31.0)
CREATININE: 8.42 mg/dL — ABNORMAL HIGH
EGFR CKD-EPI (2021) MALE: 7 mL/min/{1.73_m2} — ABNORMAL LOW (ref >=60)
GLUCOSE RANDOM: 175 mg/dL (ref 70–179)
POTASSIUM: 4.4 mmol/L (ref 3.4–4.8)
SODIUM: 135 mmol/L (ref 135–145)

## 2022-11-22 LAB — MAGNESIUM: MAGNESIUM: 2.4 mg/dL (ref 1.6–2.6)

## 2022-11-22 LAB — PHOSPHORUS: PHOSPHORUS: 11.4 mg/dL — ABNORMAL HIGH (ref 2.4–5.1)

## 2022-11-22 MED ADMIN — mycophenolate (CELLCEPT) capsule 250 mg: 250 mg | ORAL | @ 13:00:00

## 2022-11-22 MED ADMIN — diclofenac sodium (VOLTAREN) 1 % gel 2 g: 2 g | TOPICAL | @ 21:00:00

## 2022-11-22 MED ADMIN — pantoprazole (Protonix) EC tablet 40 mg: 40 mg | ORAL | @ 13:00:00

## 2022-11-22 MED ADMIN — HYDROmorphone (DILAUDID) tablet 4 mg: 4 mg | ORAL | @ 03:00:00 | Stop: 2022-12-05

## 2022-11-22 MED ADMIN — linezolid (ZYVOX) tablet 600 mg: 600 mg | ORAL | Stop: 2022-11-25

## 2022-11-22 MED ADMIN — tacrolimus (PROGRAF) capsule 1 mg: 1 mg | ORAL | @ 13:00:00

## 2022-11-22 MED ADMIN — ciprofloxacin HCl (CIPRO) tablet 500 mg: 500 mg | ORAL | @ 10:00:00 | Stop: 2022-11-24

## 2022-11-22 MED ADMIN — heparin (porcine) 5,000 unit/mL injection 7,500 Units: 7500 [IU] | SUBCUTANEOUS | @ 03:00:00

## 2022-11-22 MED ADMIN — furosemide (LASIX) 160 mg in sodium chloride (NS) 0.9 % 50 mL IVPB: 160 mg | INTRAVENOUS | @ 21:00:00 | Stop: 2022-11-22

## 2022-11-22 MED ADMIN — metOLazone (ZAROXOLYN) tablet 2.5 mg: 2.5 mg | ORAL | @ 21:00:00 | Stop: 2022-11-22

## 2022-11-22 MED ADMIN — carvedilol (COREG) tablet 6.25 mg: 6.25 mg | ORAL | @ 13:00:00

## 2022-11-22 MED ADMIN — sevelamer (RENVELA) tablet 1,600 mg: 1600 mg | ORAL | @ 17:00:00

## 2022-11-22 MED ADMIN — heparin (porcine) 5,000 unit/mL injection 7,500 Units: 7500 [IU] | SUBCUTANEOUS | @ 10:00:00

## 2022-11-22 MED ADMIN — linezolid (ZYVOX) tablet 600 mg: 600 mg | ORAL | @ 13:00:00 | Stop: 2022-11-25

## 2022-11-22 MED ADMIN — diclofenac sodium (VOLTAREN) 1 % gel 2 g: 2 g | TOPICAL

## 2022-11-22 MED ADMIN — diclofenac sodium (VOLTAREN) 1 % gel 2 g: 2 g | TOPICAL | @ 10:00:00

## 2022-11-22 MED ADMIN — HYDROmorphone (DILAUDID) tablet 4 mg: 4 mg | ORAL | @ 06:00:00 | Stop: 2022-12-05

## 2022-11-22 MED ADMIN — carvedilol (COREG) tablet 6.25 mg: 6.25 mg | ORAL

## 2022-11-22 MED ADMIN — mycophenolate (CELLCEPT) capsule 250 mg: 250 mg | ORAL

## 2022-11-22 MED ADMIN — furosemide (LASIX) injection 80 mg: 80 mg | INTRAVENOUS | @ 17:00:00 | Stop: 2022-11-22

## 2022-11-22 MED ADMIN — dextrose (D10W) 10% bolus 125 mL: 12.5 g | INTRAVENOUS | @ 04:00:00 | Stop: 2023-11-16

## 2022-11-22 MED ADMIN — heparin (porcine) 5,000 unit/mL injection 7,500 Units: 7500 [IU] | SUBCUTANEOUS | @ 17:00:00

## 2022-11-22 MED ADMIN — diclofenac sodium (VOLTAREN) 1 % gel 2 g: 2 g | TOPICAL | @ 17:00:00

## 2022-11-22 MED ADMIN — insulin lispro (HumaLOG) injection 0-20 Units: 0-20 [IU] | SUBCUTANEOUS | @ 21:00:00

## 2022-11-22 MED ADMIN — amlodipine (NORVASC) tablet 10 mg: 10 mg | ORAL | @ 13:00:00

## 2022-11-22 MED ADMIN — fluticasone propionate (FLONASE) 50 mcg/actuation nasal spray 1 spray: 1 | NASAL | @ 21:00:00

## 2022-11-22 MED ADMIN — tacrolimus (PROGRAF) capsule 1 mg: 1 mg | ORAL

## 2022-11-22 MED ADMIN — insulin lispro (HumaLOG) injection 0-20 Units: 0-20 [IU] | SUBCUTANEOUS | @ 17:00:00

## 2022-11-22 MED ADMIN — melatonin tablet 3 mg: 3 mg | ORAL | @ 03:00:00

## 2022-11-22 MED ADMIN — torsemide (DEMADEX) tablet 40 mg: 40 mg | ORAL | @ 13:00:00 | Stop: 2022-11-22

## 2022-11-22 MED ADMIN — sevelamer (RENVELA) tablet 1,600 mg: 1600 mg | ORAL | @ 21:00:00

## 2022-11-22 NOTE — Unmapped (Signed)
A/O x 4, afebrile, VSS on RA, PRN meds given for pain (see MAR). IV clean, dry, intact, and flushed per protocol. Falls precautions maintained. Call bell and bedside table within reach, bed in lowest position and locked. No falls or injuries noted this shift. Will continue with plan of care.    Problem: Adult Inpatient Plan of Care  Goal: Plan of Care Review  Outcome: Progressing  Flowsheets (Taken 11/21/2022 1759)  Progress: improving  Plan of Care Reviewed With: patient  Goal: Patient-Specific Goal (Individualized)  Outcome: Progressing  Flowsheets (Taken 11/21/2022 1759)  Patient/Family-Specific Goals (Include Timeframe): Pt will remain free from falls or injuries through discharge  Individualized Care Needs: monitor VS and labs. pain mgt. check BS. falls prec.  Anxieties, Fears or Concerns: none voiced  Goal: Absence of Hospital-Acquired Illness or Injury  Outcome: Progressing  Intervention: Identify and Manage Fall Risk  Recent Flowsheet Documentation  Taken 11/21/2022 0800 by Lonia Mad, RN  Safety Interventions: aspiration precautions  Intervention: Prevent and Manage VTE (Venous Thromboembolism) Risk  Recent Flowsheet Documentation  Taken 11/21/2022 1400 by Lonia Mad, RN  Anti-Embolism Intervention: (heparin) Other (Comment)  Taken 11/21/2022 1200 by Lonia Mad, RN  Anti-Embolism Intervention: (heparin) Other (Comment)  Taken 11/21/2022 1000 by Lonia Mad, RN  Anti-Embolism Intervention: (heparin) Other (Comment)  Taken 11/21/2022 0800 by Lonia Mad, RN  Anti-Embolism Intervention: (heparin) Other (Comment)  Goal: Optimal Comfort and Wellbeing  Outcome: Progressing  Goal: Readiness for Transition of Care  Outcome: Progressing  Goal: Rounds/Family Conference  Outcome: Progressing     Problem: Fall Injury Risk  Goal: Absence of Fall and Fall-Related Injury  Outcome: Progressing  Intervention: Promote Injury-Free Environment  Recent Flowsheet Documentation  Taken 11/21/2022 0800 by Lonia Mad, RN  Safety Interventions: aspiration precautions     Problem: Pain Acute  Goal: Optimal Pain Control and Function  Outcome: Progressing

## 2022-11-22 NOTE — Unmapped (Signed)
Internal Medicine (MEDU) Progress Note    Assessment & Plan:   Richard [name] is a 57 y.o. male with PMH NASH cirrhosis s/p OLT 2013, HFpEF, CKD5, persistent A-fib not on AC, CAD, HTN, OSA not on CPAP who presented to [facility] 5/22 with RLE cellulitis, currently in a prolonged admission due to AKI on CKD, elevated LFTs, and O2 requirement iso volume overload.     Principal Problem:    Cellulitis of right lower extremity  Active Problems:    Hypertension    Liver replaced by transplant (CMS-HCC)    Immunosuppression due to drug therapy (CMS-HCC)    CKD (chronic kidney disease) stage 4, GFR 15-29 ml/min (CMS-HCC)    (HFpEF) heart failure with preserved ejection fraction (CMS-HCC)    Severe hyperglycemia due to diabetes mellitus (CMS-HCC)    Acute kidney injury superimposed on chronic kidney disease (CMS-HCC)    CAD (coronary artery disease)    Elevated liver enzymes    Hypokalemia  Resolved Problems:    Hypomagnesemia    Active Problems    Progression of CKD G5A3 - Hyperphosphatemia   Pt has established Nephrology (Dr. Galen Manila) and Transplant Nephrology (Dr. Margaretmary Bayley). Baseline Cr 4.6-4.8, but gradually increasing during admission with the latest lab 5/29 at 8.4. Creatinine has not improved despite both diuresing and holding diuresis, concerned that CKD is progressing to ESRD with imminent requirement for HD. Discussed with nephrology will add sevelamer, nephrology is consenting for dialysis in case it is needed sooner, and we will continue to monitor volume status, electrolytes, and mental status.   - Nephro following, appreciate recs  - Continue to follow transplant clinic (scheduled on 6/3) and nephrology (6/28)  - Hold Spironolactone  - Start PO sevelamer 1600 mg TID  - Diuresis (see below)   - Avoid NSAIDs and Morphine   - Daily BMP, Mg, Phos    Hypoxemia 2/2 Pulmonary Edema - OSA not on CPAP  Patient remains hypoxemic since 5/27 iso pulmonary edema and atelectasis. Pulmonary edema is iso worsening CKD and HFpEF. Oxygen requirement continues to increase with PO torsemide 5/28. Concern that PO diuresis is not sufficient. Concern that hypoxemia is exacerbated by untreated CPAP. Discussed with nephrology, as renal function is worsening regardless of diuresis, will prioritize fluid removal. Plan for 80 mg IV lasix, will follow up UOP and consider additional diuresis if insufficient output.   - STOP torsemide  - Lasix 80 mg IV x1  - strict I&Os   - incentive spirometry   - Discuss use of nighttime CPAP with patient  - Wean O2 as tolerated    Elevated LFTs, stable - s/p Liver Transplant in 2013  Hx of liver transplant 11 years ago 2/2 NASH. On Cellcept and Tacrolimus. LFTs remain elevated since admission. Differential includes congestive hepatopathy, Dili, ischemic liver injury, rejection. MRCP with sedation ordered, discussed with radiology and patient is on schedule for 5/30. NPO until scan.   - Hepatology following, appreciate recs   - non-con MRCP, will need sedated as he had panic attack with last attempt   - Continue home Cellcept, Tacrolimus (goal 2-4, dosing per pharmacy)  - Avoid Tylenol  - Daily HFP  - NPO until after MRCP    T2DM - Hypoglycemia, resolved   Latest A1c 8.3% on 08/04/2022. Pt uses a CGM and endorses well controlled BG with Levemir and Novolog at home. Patient hypoglycemic evening of 5/28, improved s/p D10. Long-acting insulin had been held, believe hypoglycemia was most likely iso worsening kidney function  and the fact that he did not eat very much dinner. Plan to continue long-acting insulin at current dose and decrease mealtime insulin to 35u TIDAC.   - Lantus 20u nightly  - Humalog from 45u-->35u (home 55u)  - SSI  - POCT BG QID    RLE Cellulitis, improved  Patient with multiple episodes of cellulitis presented with right lower extremity cellulitis. Patient remains improved and continues to be HDS with transition from cefepime to cipro 5/28, will continue linezolid and cipro to complete 7d course of abx. - Bcx 5/22: NGTD  - Bcx 5/24: NG4d  - Antibiotics:    - s/p Clindamycin (5/22)  - s/p Vancomycin (5/23)   - s/p Doxycycline (5/24 - 5/25)   - s/p Ceftriaxone (5/25)   - s/p Cefepime (5/25 - 5/28)   - Linezolid (5/25 - 5/31)   - Ciprofloxacin (5/28 - 5/31)  - Daily CBC  - Pain regimen: PO dilaudid     Hypokalemia, resolved - Hypomagnesemia, resolved  Monitoring lytes and replenishing cautiously given AKI on CKD. Requests IV potassium when needs repletion.  - Goal K 3.6, Mg 2   - daily BMP, Mg    Chronic Conditions  Hypertension: Continue amlodipine 10 mg, Coreg 6.25 mg twice daily.   HFpEF - Hx A-Fib - CAD: hold spironolactone, continue carvedilol, continue diuresis       Daily Checklist:  Diet: Regular Diet  DVT PPx:  heparin SQ  Electrolytes:     caution with repletion given AKI on CKD  Code Status: Full Code  Dispo: Home pending improvement in AKI and LFTs  Team Contact Information:   Primary Team: Internal Medicine (MEDU)  Primary Resident: Marja Kays, MD   Resident's Pager: [phone] (Gen MedU Intern - Alvester Morin)    Interval History:   Pt had a hypoglycemic episode around 8-10 pm last night. Pt states the dinner was bad so he didn't eat much, but he already took a routine dose of short-acting insulin. Pt received D10W with good recovery. Pt was alert w/o confusion in the morning. His O2 was also increased to 5L at night. He denies any notable SOB. Lungs clear bilaterally.    All other systems were reviewed and are negative except as noted in the HPI    Objective:   Temp:  [36.5 ??C (97.7 ??F)-36.9 ??C (98.4 ??F)] 36.7 ??C (98.1 ??F)  Heart Rate:  [58-71] 66  Resp:  [20-26] 20  BP: (115-151)/(58-85) 151/85  SpO2:  [74 %-96 %] 80 %    Gen: NAD, converses normally.  HENT: Atraumatic, normocephalic.  Heart: RRR, No murmur/thrills/gallops.   Lungs: trace crackles at lung bases, no wheeze. Increased WOB on 5L [place].   Extremities: RLE is now visibly normal for his baseline. Mildly tender to palpation over the area with recent cellulitis. 2+ pitting pedal edema (unchanged)  Neuro:  Grossly symmetric, non-focal. Intact motor/sensation of legs. More drowsy compared to previous exam.   Psych: Alert, oriented.      [name], MS3    I attest that I have reviewed the medical student note and that the components of the history of the present illness, the physical exam, and the assessment and plan documented were performed by me or were performed in my presence by the student where I verified the documentation and performed (or re-performed) the exam and medical decision making.    Jiles Prows, MD  Internal Medicine PGY-1

## 2022-11-22 NOTE — Unmapped (Signed)
Tacrolimus Therapeutic Monitoring Pharmacy Note    Richard [name] is a 57 y.o. male continuing tacrolimus.     Indication: Liver transplant     Date of Transplant:  2013       Prior Dosing Information: Current regimen 1 mg BID      Source(s) of information used to determine prior to admission dosing: Home Medication List or Clinic Note    Goals:  Therapeutic Drug Levels  Tacrolimus trough goal:  2-4 ng/mL  (per 09/16/21 Clinic Note, most recent note Identified.    Additional Clinical Monitoring/Outcomes  Monitor renal function (SCr and urine output) and liver function (LFTs)  Monitor for signs/symptoms of adverse events (e.g., hyperglycemia, hyperkalemia, hypomagnesemia, hypertension, headache, tremor)    Results:     Pharmacokinetic Considerations and Significant Drug Interactions:  Concurrent hepatotoxic medications: Doxycycline (5/24.Marland Kitchen)  Concurrent CYP3A4 substrates/inhibitors: Amlodipine (homemed)  Concurrent nephrotoxic medications:   Furosemide (IV boluses) + Metolazone    Tacrolimus Dosing  Tacro 1mg  BID (5/23.Marland KitchenMarland KitchenMarland Kitchen)      Tacrolimus level  5/24 = 2.2 ng/mL at 0445, drawn ~6.75h after last dose  5/27 = 3.1 ng/mL at 0923  5/28 = 3.7 ng/mL at 0731      Assessment/Plan:  5/28 Scr continue to rise  5/28 tacro level within goal range (2-4 ng/mL)    Recommendedation(s)  Continue current regimen of 1 mg BID    Follow-up  Tacrolimus Levels:   Every other Day with AM labs drawn between 0600 - 0800 PRIOR to morning dose.  Dose due 0800  A pharmacist will continue to monitor and recommend levels as appropriate    Please page service pharmacist with questions/clarifications.    Drue Flirt, [facility]  Nov 22, 2022 4:16 PM

## 2022-11-22 NOTE — Unmapped (Signed)
Nephrology Consult Note    Requesting Attending Physician :  Earna Coder, MD  Service Requesting Consult : Med General Doristine Counter (MDU)  Reason for Consult: CKD, outpatient follow-up    Assessment and Plan:  Richard [name] is a 73M with history of liver transplant (2013) 2/2 NAFLD, HFpEF, CKD5, HTN and T2DM who presents with right leg pain concerning for cellulitis.    #AKI on CKD G5A3, severe, worsening: Baseline Cr ~4.5/GFR 14. Follows with Dr. Galen Manila for general nephrology and Dr. Margaretmary Bayley in the the pre-transplant high BMI clinic. His native kidney disease is due to biopsy-proven CNI nephropathy (biopsy done in 2020). Though this is also likely compounded by poorly controlled diabetes and HTN. He does have significant albuminuria, however unable to be on RASi or SGLT2i due to his low GFR.  Urine sed reviewed 5/26 with several granular casts. Suspect patient has very fragile hemodynamics with his liver disease and this is likely contributing to his fluctuating kidney function.  - Vein mapping ordered and pending  - I have reached out to Dr. Darlin Drop re: HD access and he will let me know his schedule in the coming days  - Given increasing oxygen requirements will re-dose IV Lasix 80 mg now  - Dose meds for GFR<15  - Avoid further nephrotoxins including NSAIDS, Morphine. Unless absolutely necessary, avoid CT with contrast and/or MRI with gadolinium.     - Currently no acute indication for HD   - Planned follow-up with Dr. Margaretmary Bayley on 6/3 and Dr. Galen Manila on 6/28    #Hypertension/volume, improved: He states at home his blood pressure usually runs ~140-150/80s.  - Amlodipine 10 mg daily  - Carvedilol 6.25 mg BID  - Holding spiro 25 mg daily    #T2DM, uncontrolled: Last A1c 8.3% (08/04/22). On insulin at home.     #S/p OLT (2013): Due to NAFLD. On Cellcept and tac at home, will defer dosing to transplant hepatology.     #Right leg pain, concern for cellulitis  - Linezolid, cefepime  - Bcx negative  - Evaluation and management per primary team  - No changes to management from a nephrology standpoint at this time    RECOMMENDATIONS:   - Can give IV Lasix 80 mg x1 now, if minimal UOP in 2-3 hours then would re-dose the Lasix to 160 mg and give with metolazone 2.5 mg  - We have consented patient for renal replacement therapy as he may need this in the coming days if he stays on this trajectory  - We will continue to follow     [name], MD  11/22/2022 7:14 AM     Medical decision-making for 11/22/22  Findings / Data     Patient has: []  acute illness w/systemic sxs  [mod]  []  two or more stable chronic illnesses [mod]  []  one chronic illness with acute exacerbation [mod]  []  acute complicated illness  [mod]  []  Undiagnosed new problem with uncertain prognosis  [mod] [x]  illness posing risk to life or bodily function (ex. AKI)  [high]  [x]  chronic illness with severe exacerbation/progression  [high]  []  chronic illness with severe side effects of treatment  [high] AKI on CKD5, hyperglycemia Probs At least 2:  Probs, Data, Risk   I reviewed: [x]  primary team note  []  consultant note(s)  []  external records [x]  chemistry results  []  CBC results  []  blood gas results  []  Other []  procedure/op note(s)   []  radiology report(s)  []  micro result(s)  []   w/ independent historian(s) Reviewed outpatient records, reviewed primary team notes, Cr worsening ?3 Data Review (2 of 3)    I independently interpreted: []  Urine Sediment  []  Renal US []  CXR Images  []  CT Images  []  Other []  EKG Tracing  Any     I discussed: []  Pathology results w/ QHPs(s) from other specialties  []  Procedural findings w/ QHPs(s) from other specialties []  Imaging w/ QHP(s) from other specialties  [x]  Treatment plan w/ QHP(s) from other specialties Plan discussed with primary team Any     Mgm't requires: []  Prescription drug(s)  [mod]  []  Kidney biopsy  [mod]  []  Central line placement  [mod] [x]  High risk medication use and/or intensive toxicity monitoring [high]  []  Renal replacement therapy [high]  []  High risk kidney biopsy  [high]  []  Escalation of care  [high]  []  High risk central line placement  [high] Close lab monitoring  Diuresis Risk      ____________________________________________________  Interval Hx:  Hypoglycemic overnight and slightly confused. Does desat while sleeping. Now with increased WOB and requiring 5L [place]. Updated sister on phone.       Physical Exam:   Vitals:    11/22/22 0226 11/22/22 0306 11/22/22 0341 11/22/22 0633   BP:   115/58    Pulse:   60    Resp:   22    Temp:   36.5 ??C (97.7 ??F)    TempSrc:   Oral    SpO2: 93% (!) 78% 96% 94%   Weight:       Height:         No intake/output data recorded.    Intake/Output Summary (Last 24 hours) at 11/22/2022 0714  Last data filed at 11/22/2022 0500  Gross per 24 hour   Intake 120 ml   Output 176 ml   Net -56 ml     Constitutional: chronically ill appearing, NAD  Heart: RRR  Lungs: Slightly increased WOB, on 5L [place]  Abd: soft, slightly distended, morbidly obese  Ext: no significant edema

## 2022-11-22 NOTE — Unmapped (Addendum)
Pt A&Ox4 but forgetful at start of shift. C/o pain in leg, pain medication given per MAR. Pt spoke with several family members at start of shift over the phone. After pt ate small dinner, pt became increasingly confused. BG checked, 70 blood glucose. This nurse had pt drink 2 apple juices which pt struggled to do (fell asleep several times while attempting to sip drink). Dr. Eloy End Chagarlamudi alerted about BG and pt's increasing confusion/AMS and she asked for BG recheck in a few hours. At next BG check, pt's BG 49, Dr. Eloy End Chagarlamudi alerted, D10 bolus ordered and given per [facility]. After bolus, pt's BG up to 106 and pt ate half of a chicken sandwich, pt was also more alert and oriented. Pt was easily confused and excitable overnight, struggled to fall asleep until about 0100. Throughout shift, pt would take of O2 (2 L) and would desat. Telemetry would call this nurse and pt would be reminded to keep O2 on. Pt experienced increased SOB around 0630, 2 L [place] adjusted to 3 L and O2 sitting around 92-94%, pt states he feels more SOB compared to yesterday. Dr. Henrietta Hoover to pass on to day team so that pt can be seen earlier in AM rounds. Pt rested between care.      Problem: Adult Inpatient Plan of Care  Goal: Plan of Care Review  Outcome: Ongoing - Unchanged  Goal: Patient-Specific Goal (Individualized)  Outcome: Ongoing - Unchanged  Goal: Absence of Hospital-Acquired Illness or Injury  Outcome: Ongoing - Unchanged  Intervention: Identify and Manage Fall Risk  Recent Flowsheet Documentation  Taken 11/21/2022 2020 by Cecille Rubin, RN  Safety Interventions:   fall reduction program maintained   lighting adjusted for tasks/safety   low bed  Intervention: Prevent Skin Injury  Recent Flowsheet Documentation  Taken 11/21/2022 2020 by Cecille Rubin, RN  Positioning for Skin: Supine/Back  Device Skin Pressure Protection: adhesive use limited  Skin Protection: adhesive use limited  Goal: Optimal Comfort and Wellbeing  Outcome: Ongoing - Unchanged  Goal: Readiness for Transition of Care  Outcome: Ongoing - Unchanged  Goal: Rounds/Family Conference  Outcome: Ongoing - Unchanged     Problem: Fall Injury Risk  Goal: Absence of Fall and Fall-Related Injury  Outcome: Ongoing - Unchanged  Intervention: Promote Injury-Free Environment  Recent Flowsheet Documentation  Taken 11/21/2022 2020 by Cecille Rubin, RN  Safety Interventions:   fall reduction program maintained   lighting adjusted for tasks/safety   low bed     Problem: Pain Acute  Goal: Optimal Pain Control and Function  Outcome: Ongoing - Unchanged

## 2022-11-22 NOTE — Unmapped (Signed)
A/O x 4, afebrile, VSS on O2 4L by [place], denies pain at this time. IV clean, dry, intact, and flushed per protocol. Pt performs ADLs with stand by assist. Falls precautions maintained. Call bell and bedside table within reach, bed in lowest position and locked. No falls or injuries noted this shift. Will continue with plan of care.    Problem: Adult Inpatient Plan of Care  Goal: Plan of Care Review  Outcome: Progressing  Flowsheets (Taken 11/22/2022 1546)  Progress: improving  Plan of Care Reviewed With: patient  Goal: Patient-Specific Goal (Individualized)  Outcome: Progressing  Flowsheets (Taken 11/22/2022 1619)  Patient/Family-Specific Goals (Include Timeframe): Pt will remain free from falls or injuries through discharge  Individualized Care Needs: monitor VS and labs. pain mgt. check BS. falls prec.  Anxieties, Fears or Concerns: none voiced  Goal: Absence of Hospital-Acquired Illness or Injury  Outcome: Progressing  Intervention: Identify and Manage Fall Risk  Recent Flowsheet Documentation  Taken 11/22/2022 0800 by Lonia Mad, RN  Safety Interventions:   bed alarm   environmental modification   fall reduction program maintained   infection management   lighting adjusted for tasks/safety   low bed   muscle strengthening facilitated   nonskid shoes/slippers when out of bed  Intervention: Prevent and Manage VTE (Venous Thromboembolism) Risk  Recent Flowsheet Documentation  Taken 11/22/2022 1400 by Lonia Mad, RN  Anti-Embolism Intervention: (heparin SQ) Other (Comment)  Taken 11/22/2022 1200 by Lonia Mad, RN  Anti-Embolism Intervention: (heparin SQ) Other (Comment)  Taken 11/22/2022 1000 by Lonia Mad, RN  Anti-Embolism Intervention: (heparin SQ) Other (Comment)  Taken 11/22/2022 0800 by Lonia Mad, RN  Anti-Embolism Intervention: (heparin SQ) Other (Comment)  Intervention: Prevent Infection  Recent Flowsheet Documentation  Taken 11/22/2022 0800 by Lonia Mad, RN  Infection Prevention: environmental surveillance performed   equipment surfaces disinfected   hand hygiene promoted   rest/sleep promoted   single patient room provided  Goal: Optimal Comfort and Wellbeing  Outcome: Progressing  Goal: Readiness for Transition of Care  Outcome: Progressing  Goal: Rounds/Family Conference  Outcome: Progressing     Problem: Fall Injury Risk  Goal: Absence of Fall and Fall-Related Injury  Outcome: Progressing  Intervention: Promote Injury-Free Environment  Recent Flowsheet Documentation  Taken 11/22/2022 0800 by Lonia Mad, RN  Safety Interventions:   bed alarm   environmental modification   fall reduction program maintained   infection management   lighting adjusted for tasks/safety   low bed   muscle strengthening facilitated   nonskid shoes/slippers when out of bed     Problem: Pain Acute  Goal: Optimal Pain Control and Function  Outcome: Progressing

## 2022-11-23 DIAGNOSIS — Z01818 Encounter for other preprocedural examination: Principal | ICD-10-CM

## 2022-11-23 DIAGNOSIS — Z01810 Encounter for preprocedural cardiovascular examination: Principal | ICD-10-CM

## 2022-11-23 DIAGNOSIS — N186 End stage renal disease: Principal | ICD-10-CM

## 2022-11-23 LAB — HEPATIC FUNCTION PANEL
ALBUMIN: 2.8 g/dL — ABNORMAL LOW (ref 3.4–5.0)
ALKALINE PHOSPHATASE: 259 U/L — ABNORMAL HIGH (ref 46–116)
ALT (SGPT): 55 U/L — ABNORMAL HIGH (ref 10–49)
AST (SGOT): 42 U/L — ABNORMAL HIGH (ref <=34)
BILIRUBIN DIRECT: 0.3 mg/dL (ref 0.00–0.30)
BILIRUBIN TOTAL: 0.6 mg/dL (ref 0.3–1.2)
PROTEIN TOTAL: 7.1 g/dL (ref 5.7–8.2)

## 2022-11-23 LAB — MAGNESIUM: MAGNESIUM: 2.4 mg/dL (ref 1.6–2.6)

## 2022-11-23 LAB — BASIC METABOLIC PANEL
ANION GAP: 15 mmol/L — ABNORMAL HIGH (ref 5–14)
BLOOD UREA NITROGEN: 96 mg/dL — ABNORMAL HIGH (ref 9–23)
BUN / CREAT RATIO: 11
CALCIUM: 8.4 mg/dL — ABNORMAL LOW (ref 8.7–10.4)
CHLORIDE: 102 mmol/L (ref 98–107)
CO2: 20 mmol/L (ref 20.0–31.0)
CREATININE: 8.82 mg/dL — ABNORMAL HIGH
EGFR CKD-EPI (2021) MALE: 7 mL/min/{1.73_m2} — ABNORMAL LOW (ref >=60)
GLUCOSE RANDOM: 296 mg/dL — ABNORMAL HIGH (ref 70–179)
POTASSIUM: 4.4 mmol/L (ref 3.4–4.8)
SODIUM: 137 mmol/L (ref 135–145)

## 2022-11-23 LAB — PHOSPHORUS: PHOSPHORUS: 11.8 mg/dL — ABNORMAL HIGH (ref 2.4–5.1)

## 2022-11-23 LAB — TACROLIMUS LEVEL, TIMED: TACROLIMUS BLOOD: 4.4 ng/mL

## 2022-11-23 MED ADMIN — heparin (porcine) 1000 unit/mL injection 1,400 Units: 1400 [IU] | @ 17:00:00 | Stop: 2022-11-23

## 2022-11-23 MED ADMIN — heparin (porcine) 5,000 unit/mL injection 7,500 Units: 7500 [IU] | SUBCUTANEOUS | @ 19:00:00

## 2022-11-23 MED ADMIN — HYDROmorphone (DILAUDID) tablet 2 mg: 2 mg | ORAL | @ 19:00:00 | Stop: 2022-11-26

## 2022-11-23 MED ADMIN — tacrolimus (PROGRAF) capsule 1 mg: 1 mg | ORAL

## 2022-11-23 MED ADMIN — HYDROmorphone (PF) injection Syrg 0.5 mg: .5 mg | INTRAVENOUS | @ 17:00:00 | Stop: 2022-11-23

## 2022-11-23 MED ADMIN — insulin lispro (HumaLOG) injection 0-20 Units: 0-20 [IU] | SUBCUTANEOUS | @ 18:00:00

## 2022-11-23 MED ADMIN — midazolam (VERSED) injection 1 mg: 1 mg | INTRAVENOUS | @ 16:00:00 | Stop: 2022-11-23

## 2022-11-23 MED ADMIN — hydrOXYzine (ATARAX) tablet 25 mg: 25 mg | ORAL | @ 14:00:00

## 2022-11-23 MED ADMIN — gentamicin-sodium citrate lock solution in NS: 1.4 mL | @ 21:00:00

## 2022-11-23 MED ADMIN — amlodipine (NORVASC) tablet 10 mg: 10 mg | ORAL | @ 13:00:00

## 2022-11-23 MED ADMIN — desmopressin (DDAVP) 41.24 mcg in sodium chloride (NS) 0.9 % 50 mL IVPB: .3 ug/kg | INTRAVENOUS | @ 18:00:00 | Stop: 2022-11-23

## 2022-11-23 MED ADMIN — insulin glargine (LANTUS) injection 15 Units: 15 [IU] | SUBCUTANEOUS | @ 03:00:00

## 2022-11-23 MED ADMIN — linezolid (ZYVOX) tablet 600 mg: 600 mg | ORAL | @ 13:00:00 | Stop: 2022-11-25

## 2022-11-23 MED ADMIN — heparin (porcine) 5,000 unit/mL injection 7,500 Units: 7500 [IU] | SUBCUTANEOUS | @ 10:00:00

## 2022-11-23 MED ADMIN — HYDROmorphone (DILAUDID) tablet 4 mg: 4 mg | ORAL | @ 03:00:00 | Stop: 2022-12-05

## 2022-11-23 MED ADMIN — mycophenolate (CELLCEPT) capsule 250 mg: 250 mg | ORAL

## 2022-11-23 MED ADMIN — metOLazone (ZAROXOLYN) tablet 2.5 mg: 2.5 mg | ORAL | @ 15:00:00 | Stop: 2022-11-23

## 2022-11-23 MED ADMIN — insulin lispro (HumaLOG) injection 35 Units: 35 [IU] | SUBCUTANEOUS | @ 23:00:00

## 2022-11-23 MED ADMIN — linezolid (ZYVOX) tablet 600 mg: 600 mg | ORAL | Stop: 2022-11-25

## 2022-11-23 MED ADMIN — insulin lispro (HumaLOG) injection 0-20 Units: 0-20 [IU] | SUBCUTANEOUS | @ 06:00:00

## 2022-11-23 MED ADMIN — HYDROmorphone (DILAUDID) tablet 2 mg: 2 mg | ORAL | @ 22:00:00 | Stop: 2022-11-26

## 2022-11-23 MED ADMIN — sevelamer (RENVELA) tablet 2,400 mg: 2400 mg | ORAL | @ 19:00:00

## 2022-11-23 MED ADMIN — diclofenac sodium (VOLTAREN) 1 % gel 2 g: 2 g | TOPICAL | @ 10:00:00

## 2022-11-23 MED ADMIN — ciprofloxacin HCl (CIPRO) tablet 500 mg: 500 mg | ORAL | @ 10:00:00 | Stop: 2022-11-24

## 2022-11-23 MED ADMIN — sevelamer (RENVELA) tablet 2,400 mg: 2400 mg | ORAL | @ 23:00:00

## 2022-11-23 MED ADMIN — fluticasone propionate (FLONASE) 50 mcg/actuation nasal spray 1 spray: 1 | NASAL | @ 13:00:00

## 2022-11-23 MED ADMIN — insulin lispro (HumaLOG) injection 9 Units: 9 [IU] | SUBCUTANEOUS | @ 10:00:00 | Stop: 2022-11-23

## 2022-11-23 MED ADMIN — diclofenac sodium (VOLTAREN) 1 % gel 2 g: 2 g | TOPICAL

## 2022-11-23 MED ADMIN — carvedilol (COREG) tablet 6.25 mg: 6.25 mg | ORAL

## 2022-11-23 MED ADMIN — sevelamer (RENVELA) tablet 1,600 mg: 1600 mg | ORAL | @ 13:00:00 | Stop: 2022-11-23

## 2022-11-23 MED ADMIN — tacrolimus (PROGRAF) capsule 1 mg: 1 mg | ORAL | @ 13:00:00

## 2022-11-23 MED ADMIN — mycophenolate (CELLCEPT) capsule 250 mg: 250 mg | ORAL | @ 13:00:00

## 2022-11-23 MED ADMIN — insulin lispro (HumaLOG) injection 0-20 Units: 0-20 [IU] | SUBCUTANEOUS | @ 12:00:00

## 2022-11-23 MED ADMIN — furosemide (LASIX) 160 mg in sodium chloride (NS) 0.9 % 50 mL IVPB: 160 mg | INTRAVENOUS | @ 14:00:00 | Stop: 2022-11-23

## 2022-11-23 MED ADMIN — heparin (porcine) 5,000 unit/mL injection 7,500 Units: 7500 [IU] | SUBCUTANEOUS | @ 03:00:00

## 2022-11-23 MED ADMIN — carvedilol (COREG) tablet 6.25 mg: 6.25 mg | ORAL | @ 13:00:00

## 2022-11-23 MED ADMIN — insulin lispro (HumaLOG) injection 0-20 Units: 0-20 [IU] | SUBCUTANEOUS | @ 23:00:00

## 2022-11-23 NOTE — Unmapped (Signed)
Pt is alert and oriented X4. Pt denied pain today in lower extremities but after procedure of placement of line in neck pt complained of severe pain at placement site. Dilaudid and therepeutic measures were given for pt. Pt went off for dialysis around 1500 today. Comfort measures and safety measures were followed for pt.    Problem: Adult Inpatient Plan of Care  Goal: Plan of Care Review  Outcome: Progressing  Goal: Patient-Specific Goal (Individualized)  Outcome: Progressing  Goal: Absence of Hospital-Acquired Illness or Injury  Outcome: Progressing  Intervention: Identify and Manage Fall Risk  Recent Flowsheet Documentation  Taken 11/23/2022 0715 by Wille Glaser, RN  Safety Interventions: fall reduction program maintained  Intervention: Prevent Infection  Recent Flowsheet Documentation  Taken 11/23/2022 0715 by Wille Glaser, RN  Infection Prevention: rest/sleep promoted  Goal: Optimal Comfort and Wellbeing  Outcome: Progressing  Goal: Readiness for Transition of Care  Outcome: Progressing  Goal: Rounds/Family Conference  Outcome: Progressing     Problem: Fall Injury Risk  Goal: Absence of Fall and Fall-Related Injury  Outcome: Progressing  Intervention: Promote Injury-Free Environment  Recent Flowsheet Documentation  Taken 11/23/2022 0715 by Wille Glaser, RN  Safety Interventions: fall reduction program maintained     Problem: Pain Acute  Goal: Optimal Pain Control and Function  Outcome: Progressing     Problem: Hemodialysis  Goal: Safe, Effective Therapy Delivery  Outcome: Progressing  Goal: Effective Tissue Perfusion  Outcome: Progressing  Goal: Absence of Infection Signs and Symptoms  Outcome: Progressing  Intervention: Prevent or Manage Infection  Recent Flowsheet Documentation  Taken 11/23/2022 0715 by Wille Glaser, RN  Infection Management: aseptic technique maintained  Infection Prevention: rest/sleep promoted

## 2022-11-23 NOTE — Unmapped (Signed)
VENOUS ACCESS ULTRASOUND PROCEDURE NOTE    Indications:   Poor venous access.    The Venous Access Team has assessed this patient for the placement of a PIV. Ultrasound guidance was necessary to obtain access.     Procedure Details:  Identity of the patient was confirmed via name, medical record number and date of birth. The availability of the correct equipment was verified.    The vein was identified for ultrasound catheter insertion.  Field was prepared with necessary supplies and equipment.  Probe cover and sterile gel utilized.  Insertion site was prepped with chlorhexidine solution and allowed to dry.  The catheter extension was primed with normal saline. A(n) 22 gauge 1.75" catheter was placed in the R Forearm with 1attempt(s). See LDA for additional details.    Catheter aspirated, 3 mL blood return present. The catheter was then flushed with 10 mL of normal saline. Insertion site cleansed, and dressing applied per manufacturer guidelines. The catheter was inserted with difficulty due to poor vasculature by Zion Ta Joy M Mayford Alberg, RN.      RN was notified.     Thank you,     Obe Ahlers Joy M Marquell Saenz, RN Venous Access Team   [phone]     Workup / Procedure Time:  30 minutes    See images below:

## 2022-11-23 NOTE — Unmapped (Signed)
Pt with adm dx of RLE Cellulitis , obese . Pt A & O x 4 and able to express needs . Afebrile this shift  . Pt off unit to PVL for Doppler study early part of shift . Pt c/o generalized discomfort  with tenderness  rated 8/10 . Denied N/V , no SOB/Dyspnea . Pain managed with PRN dose of Dilaudid as ordered .  Strict I & O . Assistance with ADLS provided  . Pt NPO since MN for possible MRCP on 11/23/22 . Falls and safety precautions reinforced with bed low and locked , SR up x 2 , call bell within reach . Pt resting quietly in bed , no acute distress noted . Will continue with current POC .    Pt with elevated BS this shift . Pt noted eating late dinner upon arrival from [facility]L  @ 2230 . BS 408 initially , Lantus 15 units adm . Rechecked BS 3 hrs later - @ 0135 BS 502 . 9 units of Humalog Insulin adm as per SSI . Med team paged and notified . Order to recheck BS in 2 hrs . Pt  asymptomatic .    Problem: Adult Inpatient Plan of Care  Goal: Plan of Care Review  Outcome: Progressing  Goal: Patient-Specific Goal (Individualized)  Outcome: Progressing  Flowsheets (Taken 11/23/2022 0249)  Patient/Family-Specific Goals (Include Timeframe): Pt will have adequate pain mgmt and remain free from falls and injuries during this shift  7p - 7a .  Individualized Care Needs: V/S , labs , ACHS , pain mgmt , PVL/Doppler study , diuretic , oxygenation , , telemetry and cont. pulse oximetry monitors , NPO, MRCP , falls/safety precautions .  Anxieties, Fears or Concerns: Pt concerned about pain mgmt and NPO status .  Goal: Absence of Hospital-Acquired Illness or Injury  Outcome: Progressing  Intervention: Identify and Manage Fall Risk  Recent Flowsheet Documentation  Taken 11/23/2022 0200 by Harlow Ohms, RN  Safety Interventions:   fall reduction program maintained   environmental modification   low bed   nonskid shoes/slippers when out of bed  Taken 11/23/2022 0000 by Harlow Ohms, RN  Safety Interventions:   fall reduction program maintained   environmental modification   low bed   nonskid shoes/slippers when out of bed  Taken 11/22/2022 2200 by Harlow Ohms, RN  Safety Interventions:   fall reduction program maintained   environmental modification   low bed   nonskid shoes/slippers when out of bed  Taken 11/22/2022 2004 by Harlow Ohms, RN  Safety Interventions:   fall reduction program maintained   environmental modification   low bed   nonskid shoes/slippers when out of bed  Intervention: Prevent Skin Injury  Recent Flowsheet Documentation  Taken 11/22/2022 2004 by Harlow Ohms, RN  Positioning for Skin: Supine/Back  Skin Protection: incontinence pads utilized  Intervention: Prevent and Manage VTE (Venous Thromboembolism) Risk  Recent Flowsheet Documentation  Taken 11/23/2022 0200 by Harlow Ohms, RN  Anti-Embolism Intervention: (VTE : Heparin SQ) Other (Comment)  Taken 11/23/2022 0000 by Harlow Ohms, RN  Anti-Embolism Intervention: (VTE : Heparin SQ) Other (Comment)  Taken 11/22/2022 2200 by Annamaria Helling A, RN  Anti-Embolism Intervention: (VTE : Heparin SQ) Other (Comment)  Taken 11/22/2022 2004 by Harlow Ohms, RN  Anti-Embolism Intervention: (VTE : Heparin SQ) Other (Comment)  Intervention: Prevent Infection  Recent Flowsheet Documentation  Taken 11/23/2022 0200 by Harlow Ohms, RN  Infection Prevention: rest/sleep promoted  Taken 11/23/2022  0000 by Harlow Ohms, RN  Infection Prevention: rest/sleep promoted  Taken 11/22/2022 2004 by Harlow Ohms, RN  Infection Prevention: environmental surveillance performed  Goal: Optimal Comfort and Wellbeing  Outcome: Progressing  Goal: Readiness for Transition of Care  Outcome: Progressing  Goal: Rounds/Family Conference  Outcome: Progressing

## 2022-11-23 NOTE — Unmapped (Signed)
Central Venous Catheter Insertion Procedure Note (CPT [phone] and [id number])    Pre-procedural Planning     Patient Name:: Richard [name]  Patient MRN: [id number]    Line type:  Trialysis line    Indications:  Hemodialysis    Known Bleeding Diathesis: Patient/caregiver denies any known bleeding or platelet disorder.     Antiplatelet Agents: This patient is not on an antiplatelet agent.    Systemic Anticoagulation: This patient is not on full systemic anticoagulation. But he is uremic so higher risk for bleeding. Ordered DDAVP to be given.    Significant Labs:  INR   Date Value Ref Range Status   11/17/2022 0.99  Final   05/23/2012 1.0  Final     PT   Date Value Ref Range Status   11/17/2022 11.1 9.9 - 12.6 sec Final   05/23/2012 10.5 9.5 - 12.7 SECONDS Final     APTT   Date Value Ref Range Status   04/24/2022 26.1 24.8 - 38.4 sec Final   05/23/2012 36.5 25.7 - 38.8 SECONDS Final     Platelet   Date Value Ref Range Status   11/21/2022 209 150 - 450 10*9/L Final   10/06/2014 179 10*9/L Final       Consent: Informed consent was obtained from Patient after explanation of the risks and benefits of the procedure, including risk for including arterial injury, pneumothorax, local and catheter-related infection, thrombosis or hematoma formation.  Alternatives were discussed and all questions answered.  Refer to the consent documentation.Informed consent was obtained after explanation of the risks ( and benefits of the procedure. Refer to the consent documentation.    Procedure Details     Time-out was performed immediately prior to the procedure.    The right internal jugular vein was identified using bedside ultrasound. This area was prepped and draped in the usual sterile fashion. Maximum sterile technique was used including antiseptics, cap, gloves, gown, hand hygiene, mask, and sterile sheet.  The patient was placed in Trendelenburg position. Local anesthesia with 1% lidocaine was applied subcutaneously then deep to the skin. The angiocath was then inserted into the internal jugular vein using ultrasound guidance. The angiocatheter placement was confirmed by manometry and the wire was imaged by ultrasound and noted to be properly positioned in the vein prior to dilation.    Using the Seldinger Technique a trialysis was placed with each port easily flushed and freely drawing venous blood.    The catheter was secured with sutures, CHG dressings applied over the site.              Condition     The patient tolerated the procedure well and remains in the same condition as pre-procedure.    Complications and Recommendations     Complications:  Very anxious throughout procedure. Required multiple people to hold his arms and legs. Given 2mg  IV versed at the beginning.     Plan:  CXR was ordered to verify catheter positioning.    Requesting Service: Med General Doristine Counter (MDU)    Time Requested: 11am  Time Completed: 12pm    Resident(s) Performing Procedure: Alfonzo Feller, MD (nephrology fellow) made first attempt. I completed the procedure with 2nd stick.

## 2022-11-23 NOTE — Unmapped (Signed)
Nephrology Consult Note    Requesting Attending Physician :  Earna Coder, MD  Service Requesting Consult : Med General Doristine Counter (MDU)  Reason for Consult: CKD, outpatient follow-up    Assessment and Plan:  Richard [name] is a 41M with history of liver transplant (2013) 2/2 NAFLD, HFpEF, CKD5, HTN and T2DM who presents with right leg pain concerning for cellulitis.    #AKI on CKD G5A3, severe, worsening: Baseline Cr ~4.5/GFR 14. Follows with Dr. Galen Manila for general nephrology and Dr. Margaretmary Bayley in the the pre-transplant high BMI clinic. His native kidney disease is due to biopsy-proven CNI nephropathy (biopsy done in 2020). Though this is also likely compounded by poorly controlled diabetes and HTN. He does have significant albuminuria, however unable to be on RASi or SGLT2i due to his low GFR.  Urine sed reviewed 5/26 with several granular casts. Suspect patient has very fragile hemodynamics with his liver disease and this is likely contributing to his fluctuating kidney function.  - Vein mapping completed  - I have reached out to Dr. Darlin Drop re: HD access and he will let me know his schedule in the coming days  - Dose meds for GFR<15  - Avoid further nephrotoxins including NSAIDS, Morphine. Unless absolutely necessary, avoid CT with contrast and/or MRI with gadolinium.     - Planned follow-up with Dr. Margaretmary Bayley on 6/3 and Dr. Galen Manila on 6/28    #Hypertension/volume, improved: He states at home his blood pressure usually runs ~140-150/80s.  - Amlodipine 10 mg daily  - Carvedilol 6.25 mg BID  - Holding spiro 25 mg daily    #T2DM, uncontrolled: Last A1c 8.3% (08/04/22). On insulin at home.     #S/p OLT (2013): Due to NAFLD. On Cellcept and tac at home, will defer dosing to transplant hepatology.     #Right leg pain, concern for cellulitis  - Linezolid, cefepime  - Bcx negative  - Evaluation and management per primary team  - No changes to management from a nephrology standpoint at this time    RECOMMENDATIONS: - Please give IV Lasix 160 mg with metolazone 2.5 mg now given increasing SOB and ongoing O2 requirement  - Now with symptoms concerning for uremia (poor PO intake, worsening fatigue, confusion, asterixis) so will plan for temporary HD catheter placement today  - Once catheter placed, can plan for first session of iHD later this afternoon versus tomorrow AM  - We will continue to follow     [name], MD  11/23/2022 11:07 AM     Medical decision-making for 11/23/22  Findings / Data     Patient has: []  acute illness w/systemic sxs  [mod]  []  two or more stable chronic illnesses [mod]  []  one chronic illness with acute exacerbation [mod]  []  acute complicated illness  [mod]  []  Undiagnosed new problem with uncertain prognosis  [mod] [x]  illness posing risk to life or bodily function (ex. AKI)  [high]  [x]  chronic illness with severe exacerbation/progression  [high]  []  chronic illness with severe side effects of treatment  [high] AKI on CKD5, hyperglycemia Probs At least 2:  Probs, Data, Risk   I reviewed: [x]  primary team note  []  consultant note(s)  []  external records [x]  chemistry results  []  CBC results  []  blood gas results  []  Other []  procedure/op note(s)   []  radiology report(s)  []  micro result(s)  []  w/ independent historian(s) Reviewed outpatient records, reviewed primary team notes, Cr worsening ?3 Data Review (2 of 3)  I independently interpreted: []  Urine Sediment  []  Renal US []  CXR Images  []  CT Images  []  Other []  EKG Tracing  Any     I discussed: []  Pathology results w/ QHPs(s) from other specialties  []  Procedural findings w/ QHPs(s) from other specialties []  Imaging w/ QHP(s) from other specialties  [x]  Treatment plan w/ QHP(s) from other specialties Plan discussed with primary team Any     Mgm't requires: []  Prescription drug(s)  [mod]  []  Kidney biopsy  [mod]  []  Central line placement  [mod] [x]  High risk medication use and/or intensive toxicity monitoring [high]  []  Renal replacement therapy [high]  []  High risk kidney biopsy  [high]  []  Escalation of care  [high]  []  High risk central line placement  [high] Close lab monitoring  Diuresis Risk      ____________________________________________________  Interval Hx:  Patient states he feels worse today and feels like he may need dialysis. Worsening SOB, poor PO intake and feels like he is twitching a lot more. He expresses anxiety with line placement. We reviewed central line placement in detail with him and his sister, Misty Stanley.      Physical Exam:   Vitals:    11/23/22 0019 11/23/22 0235 11/23/22 0600 11/23/22 0912   BP: 138/73  141/79    Pulse: 62 61 62 60   Resp: 21  21 20    Temp: 36.8 ??C (98.2 ??F)  36.6 ??C (97.9 ??F) 36.3 ??C (97.3 ??F)   TempSrc: Oral  Axillary Oral   SpO2: 95% 94% 95% 97%   Weight:   (!) 137.4 kg (303 lb)    Height:         No intake/output data recorded.    Intake/Output Summary (Last 24 hours) at 11/23/2022 1107  Last data filed at 11/23/2022 0600  Gross per 24 hour   Intake 360 ml   Output 1700 ml   Net -1340 ml     Constitutional: chronically ill appearing, anxious  Heart: RRR  Lungs: Slightly increased WOB, on 4L [place]  Abd: soft, slightly distended, morbidly obese  Ext: no significant edema  Neuro: +asterixis

## 2022-11-23 NOTE — Unmapped (Signed)
Tacrolimus Therapeutic Monitoring Pharmacy Note    Richard [name] is a 57 y.o. male continuing tacrolimus.     Indication: Liver transplant     Date of Transplant:  2013       Prior Dosing Information: Current regimen 1 mg BID      Source(s) of information used to determine prior to admission dosing: Home Medication List or Clinic Note    Goals:  Therapeutic Drug Levels  Tacrolimus trough goal:  2-4 ng/mL  (per 09/16/21 Clinic Note, most recent note Identified.    Additional Clinical Monitoring/Outcomes  Monitor renal function (SCr and urine output) and liver function (LFTs)  Monitor for signs/symptoms of adverse events (e.g., hyperglycemia, hyperkalemia, hypomagnesemia, hypertension, headache, tremor)    Results:     Pharmacokinetic Considerations and Significant Drug Interactions:  Concurrent hepatotoxic medications: Doxycycline (5/24.Marland Kitchen)  Concurrent CYP3A4 substrates/inhibitors: Amlodipine (homemed)  Concurrent nephrotoxic medications:   Furosemide (IV boluses) + Metolazone    Tacrolimus Dosing  Tacro 1mg  BID (5/23.Marland KitchenMarland KitchenMarland Kitchen)      Tacrolimus level  5/24 = 2.2 ng/mL at 0445, drawn ~6.75h after last dose  5/27 = 3.1 ng/mL at 0923  5/28 = 3.7 ng/mL at 0731  5/30 =  4.4 ng/mL at 0900 -- HD(to start??)      Assessment/Plan:  5/30 possibly starting HD in next day or so.  05/30 tacro level + dose given within minutes of each    Recommendedation(s)  Continue current regimen of 1 mg BID    Follow-up  Tacrolimus Levels:   Every other Day with AM labs drawn between 0600 - 0800 PRIOR to morning dose.  Dose due 0800  A pharmacist will continue to monitor and recommend levels as appropriate    Please page service pharmacist with questions/clarifications.    Drue Flirt, [facility]  Nov 23, 2022 12:28 PM

## 2022-11-23 NOTE — Unmapped (Signed)
[facility] Nephrology Hemodialysis Procedure Note     11/23/2022    Richard [name] was seen and examined on hemodialysis    CHIEF COMPLAINT: End Stage Renal Disease    INTERVAL HISTORY: Treatment #1 today for worsening uremia and volume overload.    DIALYSIS TREATMENT DATA:                                PHYSICAL EXAM:  Vitals:  Temp:  [36.3 ??C (97.3 ??F)-36.8 ??C (98.2 ??F)] 36.4 ??C (97.6 ??F)  Heart Rate:  [59-66] 59  BP: (136-161)/(73-88) 136/87  MAP (mmHg):  [93-107] 94    General: in no acute distress, currently dialyzing in a Bed  Pulmonary: normal respiratory effort and rales  Cardiovascular: regular rate and rhythm  Extremities: 1+  edema  Access: Right IJ non-tunneled catheter     LAB DATA:  Lab Results   Component Value Date    NA 137 11/23/2022    K 4.4 11/23/2022    CL 102 11/23/2022    CO2 20.0 11/23/2022    BUN 96 (H) 11/23/2022    CREATININE 8.82 (H) 11/23/2022    GLUCOSE 196 03/03/2012    CALCIUM 8.4 (L) 11/23/2022    MG 2.4 11/23/2022    PHOS 11.8 (H) 11/23/2022    ALBUMIN 2.8 (L) 11/23/2022      Lab Results   Component Value Date    HCT 29.1 (L) 11/21/2022    WBC 8.5 11/21/2022        ASSESSMENT/PLAN:  End Stage Renal Disease on Intermittent Hemodialysis:  UF goal: 0.5L as tolerated  Adjust medications for a GFR <10  Avoid nephrotoxic agents  Last HD Treatment:      Bone Mineral Metabolism:  Lab Results   Component Value Date    CALCIUM 8.4 (L) 11/23/2022    CALCIUM 8.6 (L) 11/22/2022    Lab Results   Component Value Date    ALBUMIN 2.8 (L) 11/23/2022    ALBUMIN 2.7 (L) 11/22/2022      Lab Results   Component Value Date    PHOS 11.8 (H) 11/23/2022    PHOS 11.4 (H) 11/22/2022    Lab Results   Component Value Date    PTH 267.5 (H) 10/14/2021      Continue phosphorus binder and dietary counseling.    Anemia:   Lab Results   Component Value Date    HGB 10.0 (L) 11/21/2022    HGB 10.9 (L) 11/20/2022    HGB 10.4 (L) 11/18/2022    Iron Saturation (%)   Date Value Ref Range Status   11/09/2022 36 % Final 05/15/2012 39 20 - 50 % Final      Lab Results   Component Value Date    FERRITIN 169.8 11/09/2022       Anemia labs appropriate, no changes.    Vascular Access:  Vascular Access functioning well - no need for intervention       IV Antibiotics to be administered at discharge:  TBD    This procedure was fully reviewed with the patient and/or their decision-maker. The risks, benefits, and alternatives were discussed prior to the procedure. All questions were answered and written informed consent was obtained.    Ernestina Columbia, MD  [facility] Division of Nephrology & Hypertension

## 2022-11-23 NOTE — Unmapped (Signed)
VASCULAR INTERVENTIONAL RADIOLOGY INPATIENT CVC CONSULTATION     Requesting Attending Physician: Earna Coder, MD  Service Requesting Consult: Med General Doristine Counter (MDU)    Date of Service: 11/23/2022  Consulting Interventional Radiologist: Dr. Rosalio Loud      HPI:     Reason for consult: placement of HD tunneled line s/p trialysis line placement 5/30 for emergency new start dialysis    History of Present Illness:   Richard [name] is a 57 y.o. male with PMH NASH cirrhosis s/p OLT 2013, HFpEF, CKD5, persistent A-fib not on AC, CAD, HTN, OSA not on CPAP who presented to [facility] 5/22 with RLE cellulitis, currently in a prolonged admission due to AKI on CKD, elevated LFTs, and O2 requirement iso volume overload. Patient has now progressed to ESRD and will be starting HD.     Patient seen in consultation at the request of primary care team for consideration for placement of tunneled HD line. Bcx collected on 11/17/22 has NGTD, no cultures pending currently. Pt has been afebrile with stable vital signs. Labs notable for glucose 296, BUN 96.  Patient has hx of non-tunneled CVC in left internal jugular vein and currently has trialysis catheter in place in right internal jugular vein for emergency dialysis iso hyperuricemia.    Review of Systems:  Constitutional: positive for fatigue  Ears, nose, mouth, throat, and face: negative  Respiratory: positive for tachypnea  Cardiovascular: negative  Neurological: negative    Medical History:     Past Medical History:  Past Medical History:   Diagnosis Date    COVID-19 07/18/2019    Family history of malignant neoplasm of prostate 06/23/2015    Hepatic cirrhosis (CMS-HCC) 06/23/2015    Overview:  Secondary to NASH; followed by Dr. Yevonne Pax, GI.  Last Assessment & Plan:  Relevant Hx: Course: Daily Update: Today's Plan:     Hypertension     Hypogonadism in male 02/12/2014    Liver cirrhosis secondary to NASH (nonalcoholic steatohepatitis) (CMS-HCC)     s/p liver transplant 2013 Surgical History:  Past Surgical History:   Procedure Laterality Date    liver transpant      LIVER TRANSPLANTATION  2013    PR COLONOSCOPY W/BIOPSY SINGLE/MULTIPLE N/A 08/13/2018    Procedure: COLONOSCOPY, FLEXIBLE, PROXIMAL TO SPLENIC FLEXURE; WITH BIOPSY, SINGLE OR MULTIPLE;  Surgeon: Neysa Hotter, MD;  Location: [facility];  Service: Gastroenterology    PR COLSC FLX W/RMVL OF TUMOR POLYP LESION SNARE TQ N/A 08/13/2018    Procedure: COLONOSCOPY FLEX; W/REMOV TUMOR/LES BY SNARE;  Surgeon: Neysa Hotter, MD;  Location: [facility];  Service: Gastroenterology    PR UPPER GI ENDOSCOPY,BIOPSY N/A 07/13/2015    Procedure: UGI ENDOSCOPY; WITH BIOPSY, SINGLE OR MULTIPLE;  Surgeon: Joaquin Music, MD;  Location: [facility];  Service: Gastroenterology       Family History:  Family History   Problem Relation Age of Onset    Diabetes Father     Liver disease Maternal Uncle     Diabetes Paternal Grandfather     Melanoma Neg Hx     Basal cell carcinoma Neg Hx     Squamous cell carcinoma Neg Hx     Kidney disease Neg Hx        Medications:   Current Facility-Administered Medications   Medication Dose Route Frequency Provider Last Rate Last Admin    aluminum-magnesium hydroxide-simethicone (MAALOX MAX) 80-80-8 mg/mL oral suspension  30 mL Oral Q4H PRN  Barnett Abu, MD   30 mL at 11/17/22 1244    amlodipine (NORVASC) tablet 10 mg  10 mg Oral Daily Barnett Abu, MD   10 mg at 11/23/22 0900    carvedilol (COREG) tablet 6.25 mg  6.25 mg Oral BID Barnett Abu, MD   6.25 mg at 11/23/22 8119    ciprofloxacin HCl (CIPRO) tablet 500 mg  500 mg Oral daily Marja Kays, MD   500 mg at 11/23/22 0541    dextrose (D10W) 10% bolus 125 mL  12.5 g Intravenous Q10 Min PRN Barnett Abu, MD 500 mL/hr at 11/22/22 0016 125 mL at 11/22/22 0016    diclofenac sodium (VOLTAREN) 1 % gel 2 g  2 g Topical QID Barnett Abu, MD   2 g at 11/23/22 0541    fluticasone propionate (FLONASE) 50 mcg/actuation nasal spray 1 spray  1 spray Each Nare Daily Marja Kays, MD   1 spray at 11/23/22 1478    gadobenate dimeglumine (MULTIHANCE) 529 mg/mL (0.48mmol/0.2mL) solution 10 mL  10 mL Intravenous Once in imaging Zollie Beckers, MD        glucagon injection 1 mg  1 mg Intramuscular Once PRN Barnett Abu, MD        glucose chewable tablet 16 g  16 g Oral Q10 Min PRN Barnett Abu, MD        guaiFENesin (ROBITUSSIN) oral syrup  200 mg Oral Q4H PRN Barnett Abu, MD        heparin (porcine) 5,000 unit/mL injection 7,500 Units  7,500 Units Subcutaneous [facility] Barnett Abu, MD   7,500 Units at 11/23/22 1433    HYDROmorphone (DILAUDID) tablet 2 mg  2 mg Oral Q6H Allman, Eric C, ANP   2 mg at 11/23/22 1433    hydrOXYzine (ATARAX) tablet 25 mg  25 mg Oral Q6H PRN Achille Rich, MD   25 mg at 11/23/22 0934    insulin glargine (LANTUS) injection 15 Units  15 Units Subcutaneous Nightly Marja Kays, MD   15 Units at 11/22/22 2237    insulin lispro (HumaLOG) injection 0-20 Units  0-20 Units Subcutaneous ACHS Barnett Abu, MD   3 Units at 11/23/22 1336    [Provider Hold] insulin lispro (HumaLOG) injection 35 Units  35 Units Subcutaneous TID AC Allman, Eric C, ANP        linezolid (ZYVOX) tablet 600 mg  600 mg Oral Q12H SCH Marja Kays, MD   600 mg at 11/23/22 0859    melatonin tablet 3 mg  3 mg Oral Nightly PRN Barnett Abu, MD   3 mg at 11/21/22 2246    midazolam (VERSED) injection 2 mg  2 mg Intravenous Once Marja Kays, MD        And    midazolam (VERSED) injection 2 mg  2 mg Intravenous Once PRN Marja Kays, MD        mycophenolate (CELLCEPT) capsule 250 mg  250 mg Oral BID Barnett Abu, MD   250 mg at 11/23/22 0900    ondansetron (ZOFRAN-ODT) disintegrating tablet 4 mg  4 mg Oral Q8H PRN Barnett Abu, MD        Or    ondansetron [facility]) injection 4 mg  4 mg Intravenous Q8H PRN Barnett Abu, MD        pantoprazole (Protonix) EC tablet 40 mg  40 mg Oral Daily Barnett Abu, MD  40 mg at 11/22/22 0849    polyethylene glycol (MIRALAX) packet 17 g  17 g Oral Daily Roanna Raider, MD        sevelamer (RENVELA) tablet 2,400 mg  2,400 mg Oral 3xd Meals Roanna Raider, MD   2,400 mg at 11/23/22 1433    sodium chloride 0.9% (NS) bolus 250 mL  250 mL Intravenous Each time in dialysis PRN [name], MD        [Provider Hold] spironolactone (ALDACTONE) tablet 25 mg  25 mg Oral Daily Barnett Abu, MD   25 mg at 11/17/22 1404    tacrolimus (PROGRAF) capsule 1 mg  1 mg Oral BID Barnett Abu, MD   1 mg at 11/23/22 1610       Allergies:  Patient has no known allergies.    Social History:  Social History     Tobacco Use    Smoking status: Former     Passive exposure: Past    Smokeless tobacco: Never   Vaping Use    Vaping status: Never Used   Substance Use Topics    Alcohol use: No    Drug use: No       Objective:      Vital Signs:  Temp:  [36.3 ??C (97.3 ??F)-36.8 ??C (98.2 ??F)] 36.4 ??C (97.6 ??F)  Heart Rate:  [59-66] 59  Resp:  [20-21] 20  BP: (136-161)/(73-88) 136/87  MAP (mmHg):  [93-107] 94  SpO2:  [94 %-98 %] 97 %    Physical Exam:      Vitals:    11/23/22 1503   BP: 136/87   Pulse: 59   Resp: 20   Temp: 36.4 ??C (97.6 ??F)   SpO2:      ASA Grade: ASA 4 - Patient with severe systemic disease that is a constant threat to life  General: No apparent distress.  Lungs: Breathing comfortably on room air.  Heart:  Regular rate and rhythm.   Neuro: No obvious focal deficits.    Airway assessment: Class 2 - Can visualize soft palate and fauces, tip of uvula is obscured    Diagnostic Studies:  None Relavent    Labs:    Recent Labs     11/20/22  2206 11/21/22  0731   WBC 9.6 8.5   HGB 10.9* 10.0*   HCT 32.5* 29.1*   PLT 232 209     Recent Labs     11/21/22  0731 11/22/22  1046 11/23/22  0901   NA 135 135 137   K 3.7 4.4 4.4   CL 101 101 102   BUN 86* 83* 96*   CREATININE 7.64* 8.42* 8.82*   GLU 232* 175 296*     Recent Labs     11/21/22  0731 11/22/22  1046 11/23/22  0901   PROT 6.6 6.7 7.1 ALBUMIN 2.5* 2.7* 2.8*   AST 43* 55* 42*   ALT 66* 60* 55*   ALKPHOS 209* 272* 259*   BILITOT 0.6 0.5 0.6   BILIDIR 0.30 0.30 0.30     No results for input(s): INR, APTT, FIBRINOGEN in the last 72 hours.    Blood Cultures Pending:  No.    Assessment and Recommendations:     Mr. Noggle is a 57 y.o. male with PMH NASH cirrhosis s/p OLT 2013, HFpEF, CKD5, persistent A-fib not on AC, CAD, HTN, OSA not on CPAP who presented to [facility] 5/22 with RLE cellulitis, currently in a prolonged admission  due to AKI on CKD, elevated LFTs, and O2 requirement iso volume overload. Patient has now progressed to ESRD and will be starting HD. Patient would like GA for the procedure. The temp right neck line is likely too high of a stick for conversion.     Recommendations:  - Proceed with placement of Dialysis - Tunneled  - Anticipated procedure date: 6/3-6/5  - Anticoagulant medication hold required: No.  - Please make NPO night prior to procedure  - Please ensure recent CBC, Creatinine, and INR are available  - Antibiotic required: Yes.   - If yes, patient will receive Ancef 3g IV (weight >120kg) in VIR prior to procedure      Informed Consent:  This procedure and sedation has been fully reviewed with the patient/patient???s authorized representative. The risks, benefits and alternatives including but not limited to infection, bleeding, damage to adjacent structures have been explained, and the patient/patient???s authorized representative has consented to the procedure.  --The patient will accept blood products in an emergent situation.  --The patient does not have a Do Not Resuscitate order in effect.    Consent obtained by Clint Bolder witnessed, and scanned into the medical record.     The patient was discussed with Dr. Rosalio Loud .     Thank you for involving [place] in the care of this patient. Please page the VIR consult pager [phone]) with further questions, concerns, or if new issues arise.

## 2022-11-23 NOTE — Unmapped (Signed)
CVAD Liaison - Hemodialysis or Trialysis Catheter Insertion Note      The CVAD Liaison was contacted for the insertion of Central Venous Access Device (CVAD) - Trialysis.  A chart review was performed.       Prior to the start of the procedure, a time out was performed and the identity of the patient was confirmed via name, medical record number and date of birth.  The sterile field was prepared with necessary supplies and equipment verified.  Insertion site was prepped with chlorhexidine solution and allowed to dry.  Maximum sterile techniques was utilized.    CVAD was inserted by Gaetano Hawthorne, MD.  Catheter was aspirated and flushed.  Insertion site cleansed, and sterile dressing applied per manufacturer guidelines.  The Central Line Checklist was referenced.  CVAD Liaison was present during entire procedure.   Report of the procedure given to the Primary Nurse.    Dialysis lumen(s) were locked with 1,000 units/mL of Heparin per protocol by  Pam Drown, CVAD Liaison      Thank you for this consult,  Beola Cord, RN, CVAD Liaison     Consult Time 90 minutes (min)

## 2022-11-23 NOTE — Unmapped (Signed)
Internal Medicine (MEDU) Progress Note    Assessment & Plan:   Richard [name] is a 57 y.o. male with PMH NASH cirrhosis s/p OLT 2013, HFpEF, CKD5, persistent A-fib not on AC, CAD, HTN, OSA not on CPAP who presented to [facility] 5/22 with RLE cellulitis, currently in a prolonged admission due to AKI on CKD, elevated LFTs, and O2 requirement iso volume overload. Patient has now progressed to ESRD and will be starting HD.     Principal Problem:    ESRD (end stage renal disease) on dialysis (CMS-HCC)  Active Problems:    Hypertension    Liver replaced by transplant (CMS-HCC)    Immunosuppression due to drug therapy (CMS-HCC)    (HFpEF) heart failure with preserved ejection fraction (CMS-HCC)    Severe hyperglycemia due to diabetes mellitus (CMS-HCC)    Cellulitis of right lower extremity    Acute kidney injury superimposed on chronic kidney disease (CMS-HCC)    CAD (coronary artery disease)    Elevated liver enzymes    Hyperphosphatemia  Resolved Problems:    Hypomagnesemia    Hypokalemia    Active Problems    Progression of CKD G5A3 - Hyperphosphatemia  - Uremia   Pt has established Nephrology (Dr. Galen Manila) and Transplant Nephrology (Dr. Margaretmary Bayley).  Baseline Cr 4.6-4.8, but gradually increasing during admission with the latest lab 5/30 at 8.82.  Creatinine has not improved despite both diuresing and holding diuresis, concerned that CKD is progressing to ESRD.  Discussed with nephrology, given volume overload and worsening mental status plan for line placement to start dialysis 5/30.  ESRD complicated by hyperphosphatemia, plan to increase sevelamer.  - Nephro following, appreciate recs  - Continue PO sevelamer 2400 mg TID  - Hold Spironolactone  - Dialysis likely on 5/30 s/p line placement   - Diuresis (see below)   - Avoid NSAIDs and Morphine   - Daily BMP, Mg, Phos    Hypoxemia 2/2 Pulmonary Edema - OSA not on CPAP  Patient remains hypoxemic since 5/27 iso pulmonary edema and atelectasis. Pulmonary edema is iso worsening CKD and HFpEF, patient with significant volume overload (8 pounds in the last 2 days).  O2 requirement did not improve with p.o. torsemide or IV Lasix despite increased urine output.  Discussed with nephrology, gave additional dose of IV Lasix 5/30 and will plan to start HD afternoon of 5/30.  To discuss CPAP with patient, with minimal if needed.  - Additional IV Lasix 160 mg, metolazone 2.5 mg x1  - HD   - Strict I&Os, daily weight  - Consider incentive spirometry and night time CPAP  - Wean O2 as tolerated    T2DM - Hypoglycemia, resolved - Hyperglycemia   Latest A1c 8.3% on 08/04/2022. Pt uses a CGM and endorses well controlled BG with Levemir and Novolog at home.  Patient with worsening glucose control this hospitalization due to worsening kidney function and variable diet.  Experienced hypoglycemia 5/28, insulin regimen was reduced and patient still was quickly experienced hyperglycemia 5/29.  Plan to continue insulin regimen below and monitor blood glucose.  - Lantus 15u nightly  - Humalog from 35u (home 55u)  - SSI  - POCT BG QID    Elevated LFTs, stable - s/p Liver Transplant in 2013  Hx of liver transplant 11 years ago 2/2 NASH.  On Cellcept and Tacrolimus.  LFTs showing slight improvement from admission 5/30.  Differential includes congestive hepatopathy, Dili, ischemic liver injury, rejection.  Deferring MRCP until after dialysis is initiated.  -  Hepatology following, appreciate recs   - non-con MRCP, will need sedated as he had panic attack with last attempt   - Continue home Cellcept, Tacrolimus (goal 2-4, dosing per pharmacy)  - Avoid Tylenol  - Daily HFP    RLE Cellulitis, improved  Patient with multiple episodes of cellulitis presented with right lower extremity cellulitis. Patient remains improved and continues to be HDS with transition from cefepime to cipro 5/28, will continue linezolid and cipro to complete 7d course of abx.   - Bcx 5/22: NGTD  - Bcx 5/24: NGTD  - Antibiotics:    - s/p Clindamycin (5/22)  - s/p Vancomycin (5/23)   - s/p Doxycycline (5/24 - 5/25)   - s/p Ceftriaxone (5/25)   - s/p Cefepime (5/25 - 5/28)   - Linezolid (5/25 - 5/31)   - Ciprofloxacin (5/28 - 5/31)  - Daily CBC  - Pain regimen: PO dilaudid     Chronic Conditions  Hypertension: Continue amlodipine 10 mg, Coreg 6.25 mg twice daily.   HFpEF - Hx A-Fib - CAD: hold spironolactone, continue carvedilol, continue diuresis       Daily Checklist:  Diet: Regular Diet  DVT PPx:  heparin SQ  Electrolytes:  K > 3.6 Mg >2 goal  caution with repletion given ESRD  Code Status: Full Code  Dispo: Home pending stabilization of ESRD    Team Contact Information:   Primary Team: Internal Medicine (MEDU)  Primary Resident: Marja Kays, MD   Resident's Pager: [phone] (Gen MedU Intern - Alvester Morin)    Interval History:   Patient with blood glucose in the 500s overnight, improved with insulin.  Patient endorses increased work of breathing.  Is okay with getting dialysis if he needs it.    All other systems were reviewed and are negative except as noted in the HPI    Objective:   Temp:  [36.3 ??C (97.3 ??F)-36.8 ??C (98.2 ??F)] 36.3 ??C (97.3 ??F)  Heart Rate:  [60-66] 60  Resp:  [20-21] 20  BP: (138-161)/(73-88) 141/79  SpO2:  [94 %-98 %] 97 %    Physical Exam  Constitutional:       Appearance: He is ill-appearing. He is not toxic-appearing.   HENT:      Head: Atraumatic.   Cardiovascular:      Rate and Rhythm: Normal rate and regular rhythm.      Heart sounds: No murmur heard.  Pulmonary:      Effort: Tachypnea present.      Breath sounds: No wheezing, rhonchi or rales.   Abdominal:      General: Abdomen is protuberant.   Musculoskeletal:      Right lower leg: Edema present.      Left lower leg: Edema present.   Skin:     Findings: No erythema.      Comments: RLE cellulitis remains improved    Neurological:      General: No focal deficit present.      Mental Status: He is easily aroused. He is confused.      Motor: Tremor present.      Comments: Patient seems more sedated compared to previous visits   Psychiatric:         Mood and Affect: Mood is anxious.         Behavior: Behavior is cooperative.             [name], MS3    I attest that I have reviewed the medical student note and that  the components of the history of the present illness, the physical exam, and the assessment and plan documented were performed by me or were performed in my presence by the student where I verified the documentation and performed (or re-performed) the exam and medical decision making.    Jiles Prows, MD  Internal Medicine PGY-1

## 2022-11-23 NOTE — Unmapped (Signed)
Medicine Procedure Service - New Procedure Request Triage    Procedure Requested: Trialysis Line  Indication: Dialysis  Urgency: Within next 24 hours    Questions for all procedures    1. Is the patient able to give consent for this procedure? - Yes  2. If no, has family to give consent been identified? - NA  3. Have you explained to the patient/family why you are recommending this procedure? - Yes  4. Safe to give peri-procedural anxiolysis? (i.e, lorazepam 1 mg IV; if so, team or M can order) - Yes  5. Has team placed med procedure consult order? (If not, please remind team to do so) - Yes  6. Is patient currently on anticoagulation? Yes     INR   Date Value Ref Range Status   11/17/2022 0.99  Final   05/23/2012 1.0  Final     PT   Date Value Ref Range Status   11/17/2022 11.1 9.9 - 12.6 sec Final   05/23/2012 10.5 9.5 - 12.7 SECONDS Final     APTT   Date Value Ref Range Status   04/24/2022 26.1 24.8 - 38.4 sec Final   05/23/2012 36.5 25.7 - 38.8 SECONDS Final     Platelet   Date Value Ref Range Status   11/21/2022 209 150 - 450 10*9/L Final   10/06/2014 179 10*9/L Final         Central Line    Does patient require central access? (e.g., meds needing central route, HD catheter, PLEX catheter) - Yes  Is patient PICC candidate? (if so, the team should consider this option) - No  Is patient able to cooperate with positioning? Can they lie flat for 30-45 min? (if not, may not be candidate for bedside CVAD) - Yes  Any severe bleeding concerns? (this is low risk procedure for bleeding, see attached guideline) - No      Medicine Procedure Service  Guidelines for Peri-procedural Management of Bleeding Risk (revised 07/19/2022)   Low Bleeding Risk  Paracentesis  Thoracentesis  I&D  CVC  Arthrocentesis   Moderate-High Bleeding Risk  Lumbar puncture*   INR <2-3, N/a for cirrhosis <2   Platelet >20 >30        ASA, low dose Not required to hold Do not hold   ASA, high dose Not required to hold Stop 5 days before   Clopidogrel Not required to hold Stop 5 days before   Ticagrelor Not required to hold Stop 5 days before   Prasugrel Not required to hold Stop 7 days before        UFH Not required to hold Stop 2-4 hours before   LMWH (prophylactic) Not required to hold Stop 12 hours before   LMWH (treatment) Not required to hold Stop 24 hours before        Warfarin INR < 3 Stop until INR < 1.8, consider 4 factor prothrombin complex concentrate with heme consult   Apixaban (Eliquis), BID Not required to hold Hold 4 doses (GFR > 50) or 6 doses (GFR <30-50). If urgent, see table below and consider heme consult.   Dabigatran (Pradaxa), BID Not require to hold Hold 5-6 doses (GFR > 50) or 7-8 doses (GFR < 30-50).  If urgent, see table below and consider heme consult.   Rivaroxaban (Xarelto), maintenance once daily Not required to hold Hold for 2 doses (GFR > 30) or 3 doses (GFR < 30).  If urgent, see table below and consider heme consult.  2019 SIR guidelines now consider LP to be a low-risk procedure, was a moderate risk procedure in the 2012 guidelines. [facility] 2015 considers LP high risk, as does MD Dareen Piano 2017 guidelines; both available online  While thoracentesis is considered a low-risk procedure, discuss with IP if patient is on antiplatelets (other than ASA) or DOAC  Adapted from the 2019 Society of Interventional Radiology Consensus Guidelines: https://www.jvir.org/article/S1051-0443(19)30407-5/pdf  While thoracentesis and paracentesis may not require holding anticoagulation, if safe and convenient to hold, doing so may reduce risk for hemorrhage if a vessel is inadvertently punctured

## 2022-11-23 NOTE — Unmapped (Signed)
#  1 TX: UF goal 0.5 l over 1.5 hours    Problem: Hemodialysis  Goal: Safe, Effective Therapy Delivery  Outcome: Ongoing - Unchanged     Problem: Hemodialysis  Goal: Effective Tissue Perfusion  Outcome: Ongoing - Unchanged     Problem: Hemodialysis  Goal: Absence of Infection Signs and Symptoms  Outcome: Ongoing - Unchanged

## 2022-11-24 LAB — HEPATITIS C ANTIBODY: HEPATITIS C ANTIBODY: NONREACTIVE

## 2022-11-24 LAB — CBC
HEMATOCRIT: 28.7 % — ABNORMAL LOW (ref 39.0–48.0)
HEMOGLOBIN: 9.5 g/dL — ABNORMAL LOW (ref 12.9–16.5)
MEAN CORPUSCULAR HEMOGLOBIN CONC: 33.3 g/dL (ref 32.0–36.0)
MEAN CORPUSCULAR HEMOGLOBIN: 27.8 pg (ref 25.9–32.4)
MEAN CORPUSCULAR VOLUME: 83.4 fL (ref 77.6–95.7)
MEAN PLATELET VOLUME: 7.6 fL (ref 6.8–10.7)
PLATELET COUNT: 199 10*9/L (ref 150–450)
RED BLOOD CELL COUNT: 3.44 10*12/L — ABNORMAL LOW (ref 4.26–5.60)
RED CELL DISTRIBUTION WIDTH: 14.2 % (ref 12.2–15.2)
WBC ADJUSTED: 7.4 10*9/L (ref 3.6–11.2)

## 2022-11-24 LAB — HIV ANTIGEN/ANTIBODY COMBO: HIV ANTIGEN/ANTIBODY COMBO: NONREACTIVE

## 2022-11-24 LAB — BASIC METABOLIC PANEL
ANION GAP: 14 mmol/L (ref 5–14)
BLOOD UREA NITROGEN: 80 mg/dL — ABNORMAL HIGH (ref 9–23)
BUN / CREAT RATIO: 10
CALCIUM: 8.3 mg/dL — ABNORMAL LOW (ref 8.7–10.4)
CHLORIDE: 102 mmol/L (ref 98–107)
CO2: 22 mmol/L (ref 20.0–31.0)
CREATININE: 8.22 mg/dL — ABNORMAL HIGH
EGFR CKD-EPI (2021) MALE: 7 mL/min/{1.73_m2} — ABNORMAL LOW (ref >=60)
GLUCOSE RANDOM: 161 mg/dL (ref 70–179)
POTASSIUM: 4 mmol/L (ref 3.4–4.8)
SODIUM: 138 mmol/L (ref 135–145)

## 2022-11-24 LAB — HEPATIC FUNCTION PANEL
ALBUMIN: 2.7 g/dL — ABNORMAL LOW (ref 3.4–5.0)
ALKALINE PHOSPHATASE: 229 U/L — ABNORMAL HIGH (ref 46–116)
ALT (SGPT): 50 U/L — ABNORMAL HIGH (ref 10–49)
AST (SGOT): 41 U/L — ABNORMAL HIGH (ref <=34)
BILIRUBIN DIRECT: 0.3 mg/dL (ref 0.00–0.30)
BILIRUBIN TOTAL: 0.6 mg/dL (ref 0.3–1.2)
PROTEIN TOTAL: 6.9 g/dL (ref 5.7–8.2)

## 2022-11-24 LAB — HEPATITIS B CORE ANTIBODY, TOTAL: HEPATITIS B CORE TOTAL ANTIBODY: NONREACTIVE

## 2022-11-24 LAB — MAGNESIUM: MAGNESIUM: 2.1 mg/dL (ref 1.6–2.6)

## 2022-11-24 LAB — HEPATITIS B SURFACE ANTIGEN: HEPATITIS B SURFACE ANTIGEN: NONREACTIVE

## 2022-11-24 LAB — HEPATITIS B SURFACE ANTIBODY
HEPATITIS B SURFACE ANTIBODY QUANT: <8 m[IU]/mL (ref <8.00)
HEPATITIS B SURFACE ANTIBODY: NONREACTIVE

## 2022-11-24 LAB — PHOSPHORUS: PHOSPHORUS: 10.6 mg/dL — ABNORMAL HIGH (ref 2.4–5.1)

## 2022-11-24 MED ADMIN — carvedilol (COREG) tablet 6.25 mg: 6.25 mg | ORAL

## 2022-11-24 MED ADMIN — mycophenolate (CELLCEPT) capsule 250 mg: 250 mg | ORAL | @ 13:00:00

## 2022-11-24 MED ADMIN — amlodipine (NORVASC) tablet 10 mg: 10 mg | ORAL | @ 13:00:00

## 2022-11-24 MED ADMIN — insulin lispro (HumaLOG) injection 0-20 Units: 0-20 [IU] | SUBCUTANEOUS | @ 21:00:00

## 2022-11-24 MED ADMIN — gentamicin-sodium citrate lock solution in NS: 1.4 mL | @ 19:00:00

## 2022-11-24 MED ADMIN — linezolid (ZYVOX) tablet 600 mg: 600 mg | ORAL | @ 13:00:00 | Stop: 2022-11-24

## 2022-11-24 MED ADMIN — diclofenac sodium (VOLTAREN) 1 % gel 2 g: 2 g | TOPICAL | @ 10:00:00

## 2022-11-24 MED ADMIN — HYDROmorphone (DILAUDID) tablet 2 mg: 2 mg | ORAL | @ 10:00:00 | Stop: 2022-11-26

## 2022-11-24 MED ADMIN — diclofenac sodium (VOLTAREN) 1 % gel 2 g: 2 g | TOPICAL | @ 21:00:00

## 2022-11-24 MED ADMIN — HYDROmorphone (DILAUDID) tablet 2 mg: 2 mg | ORAL | @ 05:00:00 | Stop: 2022-11-26

## 2022-11-24 MED ADMIN — heparin (porcine) 5,000 unit/mL injection 7,500 Units: 7500 [IU] | SUBCUTANEOUS | @ 02:00:00

## 2022-11-24 MED ADMIN — heparin (porcine) 5,000 unit/mL injection 7,500 Units: 7500 [IU] | SUBCUTANEOUS | @ 10:00:00

## 2022-11-24 MED ADMIN — hydrOXYzine (ATARAX) tablet 25 mg: 25 mg | ORAL | @ 05:00:00

## 2022-11-24 MED ADMIN — HYDROmorphone (DILAUDID) tablet 2 mg: 2 mg | ORAL | @ 21:00:00 | Stop: 2022-11-26

## 2022-11-24 MED ADMIN — insulin lispro (HumaLOG) injection 0-20 Units: 0-20 [IU] | SUBCUTANEOUS | @ 01:00:00

## 2022-11-24 MED ADMIN — pantoprazole (Protonix) EC tablet 40 mg: 40 mg | ORAL | @ 13:00:00

## 2022-11-24 MED ADMIN — diclofenac sodium (VOLTAREN) 1 % gel 2 g: 2 g | TOPICAL

## 2022-11-24 MED ADMIN — tacrolimus (PROGRAF) capsule 1 mg: 1 mg | ORAL

## 2022-11-24 MED ADMIN — melatonin tablet 3 mg: 3 mg | ORAL | @ 05:00:00

## 2022-11-24 MED ADMIN — HYDROmorphone (DILAUDID) tablet 2 mg: 2 mg | ORAL | @ 16:00:00 | Stop: 2022-11-26

## 2022-11-24 MED ADMIN — insulin lispro (HumaLOG) injection 0-20 Units: 0-20 [IU] | SUBCUTANEOUS | @ 13:00:00

## 2022-11-24 MED ADMIN — insulin lispro (HumaLOG) injection 35 Units: 35 [IU] | SUBCUTANEOUS | @ 14:00:00

## 2022-11-24 MED ADMIN — carvedilol (COREG) tablet 6.25 mg: 6.25 mg | ORAL | @ 13:00:00

## 2022-11-24 MED ADMIN — sevelamer (RENVELA) tablet 2,400 mg: 2400 mg | ORAL | @ 21:00:00

## 2022-11-24 MED ADMIN — diclofenac sodium (VOLTAREN) 1 % gel 2 g: 2 g | TOPICAL | @ 16:00:00

## 2022-11-24 MED ADMIN — sevelamer (RENVELA) tablet 2,400 mg: 2400 mg | ORAL | @ 13:00:00

## 2022-11-24 MED ADMIN — insulin lispro (HumaLOG) injection 35 Units: 35 [IU] | SUBCUTANEOUS | @ 21:00:00

## 2022-11-24 MED ADMIN — insulin glargine (LANTUS) injection 15 Units: 15 [IU] | SUBCUTANEOUS | @ 01:00:00

## 2022-11-24 MED ADMIN — ciprofloxacin HCl (CIPRO) tablet 500 mg: 500 mg | ORAL | @ 10:00:00 | Stop: 2022-11-24

## 2022-11-24 MED ADMIN — linezolid (ZYVOX) tablet 600 mg: 600 mg | ORAL | Stop: 2022-11-25

## 2022-11-24 MED ADMIN — heparin (porcine) 5,000 unit/mL injection 7,500 Units: 7500 [IU] | SUBCUTANEOUS | @ 20:00:00

## 2022-11-24 MED ADMIN — mycophenolate (CELLCEPT) capsule 250 mg: 250 mg | ORAL

## 2022-11-24 MED ADMIN — tacrolimus (PROGRAF) capsule 1 mg: 1 mg | ORAL | @ 13:00:00

## 2022-11-24 NOTE — Unmapped (Signed)
[facility] Nephrology Hemodialysis Procedure Note     11/24/2022    Richard [name] was seen and examined on hemodialysis    CHIEF COMPLAINT: End Stage Renal Disease    INTERVAL HISTORY: Treatment #2 today. Feeling better already, off supplemental O2.    DIALYSIS TREATMENT DATA:  Estimated Dry Weight (kg):  (unknown) Patient Goal Weight (kg): 1 kg (2 lb 3.3 oz)   Pre-Treatment Weight (kg):  (utw, weakness, came on bed)    Dialysis Bath  Bath: 3 K+ / 2.5 Ca+  Dialysate Na (mEq/L): 137 mEq/L  Dialysate HCO3 (mEq/L): 35 mEq/L Dialyzer: F-180 (98 mLs)   Blood Flow Rate (mL/min): 300 mL/min Dialysis Flow (mL/min): 600 mL/min   Machine Temperature (C): 36.5 ??C (97.7 ??F)      PHYSICAL EXAM:  Vitals:  Temp:  [36.3 ??C (97.3 ??F)-36.8 ??C (98.2 ??F)] 36.8 ??C (98.2 ??F)  Heart Rate:  [60-86] 66  BP: (120-174)/(70-98) 172/98  MAP (mmHg):  [83-118] 118    General: in no acute distress, currently dialyzing in a Bed  Pulmonary: normal respiratory effort and rales  Cardiovascular: regular rate and rhythm  Extremities: 1+  edema  Access: Right IJ non-tunneled catheter     LAB DATA:  Lab Results   Component Value Date    NA 138 11/24/2022    K 4.0 11/24/2022    CL 102 11/24/2022    CO2 22.0 11/24/2022    BUN 80 (H) 11/24/2022    CREATININE 8.22 (H) 11/24/2022    GLUCOSE 196 03/03/2012    CALCIUM 8.3 (L) 11/24/2022    MG 2.1 11/24/2022    PHOS 10.6 (H) 11/24/2022    ALBUMIN 2.7 (L) 11/24/2022      Lab Results   Component Value Date    HCT 28.7 (L) 11/24/2022    WBC 7.4 11/24/2022        ASSESSMENT/PLAN:  End Stage Renal Disease on Intermittent Hemodialysis:  UF goal: 1L as tolerated  Adjust medications for a GFR <10  Avoid nephrotoxic agents  Last HD Treatment:Started (11/24/22)     Bone Mineral Metabolism:  Lab Results   Component Value Date    CALCIUM 8.3 (L) 11/24/2022    CALCIUM 8.4 (L) 11/23/2022    Lab Results   Component Value Date    ALBUMIN 2.7 (L) 11/24/2022    ALBUMIN 2.8 (L) 11/23/2022      Lab Results   Component Value Date    PHOS 10.6 (H) 11/24/2022    PHOS 11.8 (H) 11/23/2022    Lab Results   Component Value Date    PTH 267.5 (H) 10/14/2021      Continue phosphorus binder and dietary counseling.    Anemia:   Lab Results   Component Value Date    HGB 9.5 (L) 11/24/2022    HGB 10.0 (L) 11/21/2022    HGB 10.9 (L) 11/20/2022    Iron Saturation (%)   Date Value Ref Range Status   11/09/2022 36 % Final   05/15/2012 39 20 - 50 % Final      Lab Results   Component Value Date    FERRITIN 169.8 11/09/2022       Anemia labs appropriate, no changes. Consider ESA initiation if hemoglobin remains < 10.     Vascular Access:  Vascular Access functioning well - no need for intervention. Will need tunneled line by VIR.  Blood Flow Rate (mL/min): 300 mL/min    IV Antibiotics to be administered at discharge:  TBD  This procedure was fully reviewed with the patient and/or their decision-maker. The risks, benefits, and alternatives were discussed prior to the procedure. All questions were answered and written informed consent was obtained.    Ernestina Columbia, MD  [facility] Division of Nephrology & Hypertension

## 2022-11-24 NOTE — Unmapped (Signed)
HEMODIALYSIS NURSE PROCEDURE NOTE       Treatment Number:  1 Room / Station:  4    Procedure Date:  11/23/22 Device Name/Number: [place]    Total Dialysis Treatment Time:  92 Min.    CONSENT:    Written consent was obtained prior to the procedure and is detailed in the medical record.  Prior to the start of the procedure, a time out was taken and the identity of the patient was confirmed via name, medical record number and date of birth.     WEIGHT:  Hemodialysis Pre-Treatment Weights       Date/Time Pre-Treatment Weight (kg) Estimated Dry Weight (kg) Patient Goal Weight (kg) Total Goal Weight (kg)    11/23/22 1500 --  UTW --  TBD 0.5 kg (1 lb 1.6 oz) 1.05 kg (2 lb 5 oz)           Hemodialysis Post Treatment Weights       Date/Time Post-Treatment Weight (kg) Treatment Weight Change (kg)    11/23/22 1655 --  Pt refused --          Active Dialysis Orders (168h ago, onward)       Start     Ordered    11/23/22 1303  Hemodialysis inpatient  Daily      Comments: #1 (5/30): BFR 200/DFR 400, 1.5 hrs, UF 0.5 L  #2:(5/31): BFR 300/DFR 600, 2.5 hrs, UF 1 L  #3: (6/1): BFR 400/DFR 800, 3.5 hrs, UF 1.5 L   Question Answer Comment   Patient HD Status: Chronic    New Start? Yes    K+ 3 meq/L    Ca++ 2.5 meq/L    Bicarb 35 meq/L    Na+ 137 meq/L    Na+ Modeling no    Dialyzer F180NR    Dialysate Temperature (C) 36.5    BFR-As tolerated to a maximum of: Other (please specify) see comments   DFR Other (please specify) see comments   Duration of treatment Other (Specify) see comments   Dry weight (kg) unknown    Challenge dry weight (kg) no    Fluid removal (L) see comments    Tubing Adult = 142 ml    Access Site Dialysis Catheter    Access Site Location Right    Keep SBP >: 100        11/23/22 1302                  ASSESSMENT:  General appearance: Alert  Neurologic: A/Ox3  Lungs: diminished  Heart: S1S2  Abdomen: no complaints      ACCESS SITE:          Hemodialysis Catheter With Distal Infusion Port 11/23/22 Right Internal jugular 1.4 mL 1.4 mL (Active)   Site Assessment Clean;Dry;Intact 11/23/22 1655   Proximal Lumen Status / Patency Gentamicin Citrate Locked 11/23/22 1655   Proximal Lumen Intervention Deaccessed 11/23/22 1655   Medial Lumen Status / Patency Gentamicin Citrate Locked 11/23/22 1655   Medial Lumen Intervention Deaccessed 11/23/22 1655   Lumen 3, Distal Status / Patency Capped 11/23/22 1655   Distal Lumen Intervention Flushed 11/23/22 1315   Dressing Type CHG gel;Occlusive;Transparent 11/23/22 1655   Dressing Status      Clean;Dry;Intact/not removed 11/23/22 1655   Dressing Intervention No intervention needed 11/23/22 1655   Dressing Change Due 11/30/22 11/23/22 1655   Line Necessity Reviewed? Y 11/23/22 1655   Line Necessity Indications Yes - Hemodialysis 11/23/22 1655   Line Necessity Reviewed  With Nephrology 11/23/22 1507        Catheter fill volumes:    Arterial: 1.4 mL Venous: 1.4 mL   Catheter filled with Gentamicin citrate post procedure.     Patient Lines/Drains/Airways Status       Active Peripheral & Central Intravenous Access       Name Placement date Placement time Site Days    Peripheral IV 11/22/22 Anterior;Right Forearm 11/22/22  1804  Forearm  less than 1    Hemodialysis Catheter With Distal Infusion Port 11/23/22 Right Internal jugular 1.4 mL 1.4 mL 11/23/22  1324  Internal jugular  less than 1                   LAB RESULTS:  Lab Results   Component Value Date    NA 137 11/23/2022    K 4.4 11/23/2022    CL 102 11/23/2022    CO2 20.0 11/23/2022    BUN 96 (H) 11/23/2022    CREATININE 8.82 (H) 11/23/2022    GLU 296 (H) 11/23/2022    GLUF 138 10/06/2014    CALCIUM 8.4 (L) 11/23/2022    CAION 4.57 11/20/2022    ICAV 4.88 03/04/2012    PHOS 11.8 (H) 11/23/2022    MG 2.4 11/23/2022    PTH 267.5 (H) 10/14/2021    IRON 95 11/09/2022    LABIRON 36 11/09/2022    TRANSFERRIN 212.0 (L) 11/09/2022    FERRITIN 169.8 11/09/2022    TIBC 267.1 11/09/2022     Lab Results   Component Value Date    WBC 8.5 11/21/2022    HGB 10.0 (L) 11/21/2022    HCT 29.1 (L) 11/21/2022    PLT 209 11/21/2022    PHART 7.36 03/04/2012    PO2ART 88 03/04/2012    PCO2ART 43 03/04/2012    HCO3ART 23.4 03/04/2012    BEART -1.3 03/04/2012    O2SATART 98.1 03/04/2012    PTINR : 05/06/2012    APTT 26.1 04/24/2022        VITAL SIGNS:   Temperature       Date/Time Temp Temp src       11/23/22 1700 36.3 ??C (97.3 ??F) Axillary     11/23/22 1503 36.4 ??C (97.6 ??F) Oral           Hemodynamics       Date/Time Pulse BP MAP (mmHg) Patient Position    11/23/22 1700 62 135/70 97 Lying    11/23/22 1655 62 135/70 -- Lying    11/23/22 1645 86 137/84 -- Lying    11/23/22 1615 66 152/82 -- Lying    11/23/22 1545 62 141/81 -- Lying    11/23/22 1523 60 139/82 -- Lying    11/23/22 1503 59 136/87 -- Lying          Blood Volume Monitor       Date/Time Blood Volume Change (%) HCT HGB Critline O2 SAT %    11/23/22 1655 -4.3 % 31.7 10.8 56.4    11/23/22 1645 -5.5 % 32.1 10.9 65.6    11/23/22 1615 -5.5 % 32.1 10.9 68.7    11/23/22 1545 -3.8 % 31.5 10.7 52.5          Oxygen Therapy       Date/Time Resp SpO2 O2 Device O2 Flow Rate (L/min)    11/23/22 1700 18 -- None (Room air) --    11/23/22 1655 18 -- None (Room air) --    11/23/22 1645 -- -- None (  Room air) --    11/23/22 1615 -- -- None (Room air) --    11/23/22 1545 -- -- None (Room air) --    11/23/22 1523 20 -- None (Room air) --    11/23/22 1503 20 -- None (Room air) --            Pre-Hemodialysis Assessment       Date/Time Therapy Number Dialyzer Hemodialysis Line Type All Machine Alarms Passed    11/23/22 1500 1 F-180 (98 mLs) Adult (142 m/s) Yes      Date/Time Air Detector Saline Line Double Clampled Hemo-Safe Applied Dialysis Flow (mL/min)    11/23/22 1500 Engaged -- -- 400 mL/min      Date/Time Verify Priming Solution Priming Volume Hemodialysis Independent pH Hemodialysis Machine Conductivity (mS/cm)    11/23/22 1500 0.9% NS 300 mL -- 13.8 mS/cm      Date/Time Hemodialysis Independent Conductivity (mS/cm) Bicarb Conductivity Residual Bleach Negative Total Chlorine    11/23/22 1500 13.7 mS/cm -- Yes 0          Pre-Hemodialysis Treatment Comments       Date/Time Pre-Hemodialysis Comments    11/23/22 1500 Arrived in Bed          Hemodialysis Treatment       Date/Time Blood Flow Rate (mL/min) Arterial Pressure (mmHg) Venous Pressure (mmHg) Transmembrane Pressure (mmHg)    11/23/22 1655 200 mL/min -56 mmHg 73 mmHg 44 mmHg    11/23/22 1645 200 mL/min -51 mmHg 70 mmHg 50 mmHg    11/23/22 1615 200 mL/min -55 mmHg 76 mmHg 50 mmHg    11/23/22 1545 200 mL/min -48 mmHg 73 mmHg 50 mmHg    11/23/22 1523 200 mL/min -40 mmHg 70 mmHg 50 mmHg      Date/Time Ultrafiltration Rate (mL/hr) Ultrafiltrate Removed (mL) Dialysate Flow Rate (mL/min) KECN (Kecn)    11/23/22 1655 300 mL/hr 1050 mL 400 ml/min --    11/23/22 1645 630 mL/hr 999 mL 400 ml/min --    11/23/22 1615 630 mL/hr 689 mL 400 ml/min --    11/23/22 1545 630 mL/hr 376 mL 400 ml/min --    11/23/22 1523 630 mL/hr 0 mL 400 ml/min --          Hemodialysis Treatment Comments       Date/Time Intra-Hemodialysis Comments    11/23/22 1655 TX terminated, rinse back given    11/23/22 1645 No complaints    11/23/22 1615 Pt talking on the phone    11/23/22 1545 VSS    11/23/22 1523 TX initiated, saline prime given          Post Treatment       Date/Time Rinseback Volume (mL) On Line Clearance: spKt/V Total Liters Processed (L/min) Dialyzer Clearance    11/23/22 1655 300 mL -- 18.6 L/min Heavily streaked          Post Hemodialysis Treatment Comments       Date/Time Post-Hemodialysis Comments    11/23/22 1655 Alert and stable          Hemodialysis I/O       Date/Time Total Hemodialysis Replacement Volume (mL) Total Ultrafiltrate Output (mL)    11/23/22 1655 -- 500 mL            8327-8327-01 - Medicaitons Given During Treatment  (last 3 hrs)           ** NO USER **         Medication Name Action Time Action Route Rate Dose  User     [Provider Hold] insulin lispro (HumaLOG) injection 35 Units On hold since yesterday at 0109 until manually unheld; held by Dorris Fetch Reason: Other (See comments): 11/23/22 1630 Provider Hold Dose Subcutaneous   ** No User **            Wille Glaser, RN         Medication Name Action Time Action Route Rate Dose User     HYDROmorphone (DILAUDID) tablet 2 mg 11/23/22 1433 Given Oral  2 mg Wille Glaser, RN     heparin (porcine) 5,000 unit/mL injection 7,500 Units 11/23/22 1433 Given Subcutaneous  7,500 Units Wille Glaser, RN     sevelamer (RENVELA) tablet 2,400 mg 11/23/22 1433 Given Oral  2,400 mg Wille Glaser, RN            Levander Campion, RN         Medication Name Action Time Action Route Rate Dose User     gentamicin-sodium citrate lock solution in NS 11/23/22 1655 Given Intra-cannular  1.4 mL Levander Campion, RN     gentamicin-sodium citrate lock solution in NS 11/23/22 1655 Given Intra-cannular  1.4 mL Levander Campion, RN                      Patient tolerated treatment in a  Bed.

## 2022-11-24 NOTE — Unmapped (Signed)
HEMODIALYSIS NURSE PROCEDURE NOTE       Treatment Number:  2 Room / Station:  4    Procedure Date:  11/24/22 Device Name/Number: [place]    Total Dialysis Treatment Time:  154 Min.    CONSENT:    Written consent was obtained prior to the procedure and is detailed in the medical record.  Prior to the start of the procedure, a time out was taken and the identity of the patient was confirmed via name, medical record number and date of birth.     WEIGHT:  Hemodialysis Pre-Treatment Weights       Date/Time Pre-Treatment Weight (kg) Estimated Dry Weight (kg) Patient Goal Weight (kg) Total Goal Weight (kg)    11/24/22 1230 --  utw, weakness, came on bed --  unknown 1 kg (2 lb 3.3 oz) 1.55 kg (3 lb 6.7 oz)           Hemodialysis Post Treatment Weights       Date/Time Post-Treatment Weight (kg) Treatment Weight Change (kg)    11/24/22 1532 --  utw, weakness --    11/23/22 1655 --  Pt refused --          Active Dialysis Orders (168h ago, onward)       Start     Ordered    11/23/22 1303  Hemodialysis inpatient  Daily      Comments: #1 (5/30): BFR 200/DFR 400, 1.5 hrs, UF 0.5 L  #2:(5/31): BFR 300/DFR 600, 2.5 hrs, UF 1 L  #3: (6/1): BFR 400/DFR 800, 3.5 hrs, UF 1.5 L   Question Answer Comment   Patient HD Status: Chronic    New Start? Yes    K+ 3 meq/L    Ca++ 2.5 meq/L    Bicarb 35 meq/L    Na+ 137 meq/L    Na+ Modeling no    Dialyzer F180NR    Dialysate Temperature (C) 36.5    BFR-As tolerated to a maximum of: Other (please specify) see comments   DFR Other (please specify) see comments   Duration of treatment Other (Specify) see comments   Dry weight (kg) unknown    Challenge dry weight (kg) no    Fluid removal (L) see comments    Tubing Adult = 142 ml    Access Site Dialysis Catheter    Access Site Location Right    Keep SBP >: 100        11/23/22 1302                  ASSESSMENT:  General appearance: alert  Neurologic: Grossly normal  Lungs: clear to auscultation bilaterally  Heart: regular rate and rhythm, S1, S2 normal, no murmur, click, rub or gallop  Abdomen: soft, non-tender; bowel sounds normal; no masses,  no organomegaly  Pulses:   Skin:     ACCESS SITE:          Hemodialysis Catheter With Distal Infusion Port 11/23/22 Right Internal jugular 1.4 mL 1.4 mL (Active)   Site Assessment Dry;Intact 11/24/22 1525   Proximal Lumen Status / Patency Capped;Gentamicin Citrate Locked 11/24/22 1525   Proximal Lumen Intervention Deaccessed 11/24/22 1525   Medial Lumen Status / Patency Capped;Gentamicin Citrate Locked 11/24/22 1525   Medial Lumen Intervention Deaccessed 11/24/22 1525   Lumen 3, Distal Status / Patency Capped 11/23/22 2000   Distal Lumen Intervention Flushed 11/23/22 1315   Dressing Type CHG gel;Occlusive 11/24/22 1525   Dressing Status      Intact/not removed;Dry  11/24/22 1525   Dressing Intervention No intervention needed 11/24/22 1525   Dressing Change Due 11/30/22 11/24/22 1525   Line Necessity Reviewed? Y 11/24/22 1525   Line Necessity Indications Yes - Hemodialysis 11/24/22 1525   Line Necessity Reviewed With neph 11/24/22 1525        Catheter fill volumes:    Arterial: 1.4 mL Venous: 1.4 mL   Catheter filled with gentamicin citrate  post procedure.     Patient Lines/Drains/Airways Status       Active Peripheral & Central Intravenous Access       Name Placement date Placement time Site Days    Peripheral IV 11/22/22 Anterior;Right Forearm 11/22/22  1804  Forearm  1    Hemodialysis Catheter With Distal Infusion Port 11/23/22 Right Internal jugular 1.4 mL 1.4 mL 11/23/22  1324  Internal jugular  1                   LAB RESULTS:  Lab Results   Component Value Date    NA 138 11/24/2022    K 4.0 11/24/2022    CL 102 11/24/2022    CO2 22.0 11/24/2022    BUN 80 (H) 11/24/2022    CREATININE 8.22 (H) 11/24/2022    GLU 161 11/24/2022    GLUF 138 10/06/2014    CALCIUM 8.3 (L) 11/24/2022    CAION 4.57 11/20/2022    ICAV 4.88 03/04/2012    PHOS 10.6 (H) 11/24/2022    MG 2.1 11/24/2022    PTH 267.5 (H) 10/14/2021    IRON 95 11/09/2022    LABIRON 36 11/09/2022    TRANSFERRIN 212.0 (L) 11/09/2022    FERRITIN 169.8 11/09/2022    TIBC 267.1 11/09/2022     Lab Results   Component Value Date    WBC 7.4 11/24/2022    HGB 9.5 (L) 11/24/2022    HCT 28.7 (L) 11/24/2022    PLT 199 11/24/2022    PHART 7.36 03/04/2012    PO2ART 88 03/04/2012    PCO2ART 43 03/04/2012    HCO3ART 23.4 03/04/2012    BEART -1.3 03/04/2012    O2SATART 98.1 03/04/2012    PTINR : 05/06/2012    APTT 26.1 04/24/2022        VITAL SIGNS:   Temperature       Date/Time Temp Temp src       11/24/22 1525 37 ??C (98.6 ??F) Oral     11/24/22 1520 37 ??C (98.6 ??F) Oral     11/24/22 1246 36.8 ??C (98.2 ??F) Oral           Hemodynamics       Date/Time Pulse BP MAP (mmHg) Patient Position    11/24/22 1525 66 152/82 111 Lying    11/24/22 1520 65 158/73 -- Lying    11/24/22 1515 67 158/74 -- --    11/24/22 1500 66 172/98 -- Lying    11/24/22 1445 62 154/95 -- --    11/24/22 1430 62 137/78 -- Lying    11/24/22 1400 61 147/77 -- Lying    11/24/22 1345 63 138/86 -- --    11/24/22 1330 61 174/93 -- Lying    11/24/22 1315 60 169/89 -- Lying    11/24/22 1300 62 157/81 -- Lying    11/24/22 1246 61 155/86 -- Lying    11/24/22 1232 64 162/92 118 Lying    11/24/22 1200 -- --  otf -- --          Blood Volume  Monitor       Date/Time Blood Volume Change (%) HCT HGB Critline O2 SAT %    11/24/22 1520 -4.9 % 29.4 10 57.7    11/24/22 1515 -4.7 % 29.3 10 60    11/24/22 1500 -4.2 % 29.2 9.9 65    11/24/22 1445 -4.9 % 29.4 10 65.7    11/24/22 1430 -4.6 % 29.3 10 61.9    11/24/22 1400 -4.6 % 29.3 10 64.6    11/24/22 1345 -4.3 % 29.2 9.9 65.5    11/24/22 1330 -3.7 % 29 9.9 60.6    11/24/22 1315 -2.8 % 28.8 9.8 61.9    11/24/22 1300 -1.6 % 28.4 9.7 60.9    11/24/22 1246 -- 28.2 9.6 54.9          Oxygen Therapy       Date/Time Resp SpO2 O2 Device O2 Flow Rate (L/min)    11/24/22 1525 18 -- None (Room air) --    11/24/22 1520 18 -- None (Room air) --    11/24/22 1500 18 -- None (Room air) --    11/24/22 1430 18 -- None (Room air) --    11/24/22 1400 18 -- None (Room air) --    11/24/22 1330 18 -- None (Room air) --    11/24/22 1315 -- -- None (Room air) --    11/24/22 1300 -- -- None (Room air) --    11/24/22 1246 18 -- None (Room air) --            Pre-Hemodialysis Assessment       Date/Time Therapy Number Dialyzer Hemodialysis Line Type All Machine Alarms Passed    11/24/22 1230 2 F-180 (98 mLs) Adult (142 m/s) Yes      Date/Time Air Detector Saline Line Double Clampled Hemo-Safe Applied Dialysis Flow (mL/min)    11/24/22 1230 Engaged -- -- 600 mL/min      Date/Time Verify Priming Solution Priming Volume Hemodialysis Independent pH Hemodialysis Machine Conductivity (mS/cm)    11/24/22 1230 0.9% NS 300 mL -- 13.7 mS/cm      Date/Time Hemodialysis Independent Conductivity (mS/cm) Bicarb Conductivity Residual Bleach Negative Total Chlorine    11/24/22 1230 13.5 mS/cm -- Yes 0          Pre-Hemodialysis Treatment Comments       Date/Time Pre-Hemodialysis Comments    11/24/22 1230 pt aert, came on bed          Hemodialysis Treatment       Date/Time Blood Flow Rate (mL/min) Arterial Pressure (mmHg) Venous Pressure (mmHg) Transmembrane Pressure (mmHg)    11/24/22 1520 200 mL/min -5 mmHg 25 mmHg 95 mmHg    11/24/22 1515 300 mL/min -97 mmHg 109 mmHg 70 mmHg    11/24/22 1500 300 mL/min -98 mmHg 105 mmHg 69 mmHg    11/24/22 1445 300 mL/min -100 mmHg 108 mmHg 64 mmHg    11/24/22 1430 300 mL/min -96 mmHg 115 mmHg 68 mmHg    11/24/22 1400 300 mL/min -104 mmHg 102 mmHg 72 mmHg    11/24/22 1345 300 mL/min -96 mmHg 109 mmHg 68 mmHg    11/24/22 1330 300 mL/min -95 mmHg 100 mmHg 72 mmHg    11/24/22 1315 300 mL/min -90 mmHg 94 mmHg 73 mmHg    11/24/22 1300 300 mL/min -83 mmHg 96 mmHg 70 mmHg    11/24/22 1246 300 mL/min -76 mmHg 55 mmHg 21 mmHg      Date/Time Ultrafiltration Rate (mL/hr) Ultrafiltrate Removed (mL) Dialysate Flow Rate (mL/min)  KECN Linna Caprice)    11/24/22 1520 0 mL/hr 1551 mL 600 ml/min --    11/24/22 1515 620 mL/hr 1509 mL 600 ml/min --    11/24/22 1500 620 mL/hr 1356 mL 600 ml/min --    11/24/22 1445 620 mL/hr 1202 mL 600 ml/min --    11/24/22 1430 620 mL/hr 1048 mL 600 ml/min --    11/24/22 1400 620 mL/hr 741 mL 600 ml/min --    11/24/22 1345 620 mL/hr 587 mL 600 ml/min --    11/24/22 1330 620 mL/hr 434 mL 600 ml/min --    11/24/22 1315 620 mL/hr 280 mL 600 ml/min --    11/24/22 1300 620 mL/hr 127 mL 600 ml/min --    11/24/22 1246 620 mL/hr 0 mL 600 ml/min --          Hemodialysis Treatment Comments       Date/Time Intra-Hemodialysis Comments    11/24/22 1520 rx completed , blood rinsed back    11/24/22 1500 stable    11/24/22 1430 stable    11/24/22 1400 stable    11/24/22 1330 stable    11/24/22 1246 rx started , vss, report given to K          Post Treatment       Date/Time Rinseback Volume (mL) On Line Clearance: spKt/V Total Liters Processed (L/min) Dialyzer Clearance    11/24/22 1532 300 mL --  na 44.6 L/min Lightly streaked    11/23/22 1655 300 mL -- 18.6 L/min Heavily streaked          Post Hemodialysis Treatment Comments       Date/Time Post-Hemodialysis Comments    11/24/22 1532 stable post HD          Hemodialysis I/O       Date/Time Total Hemodialysis Replacement Volume (mL) Total Ultrafiltrate Output (mL)    11/24/22 1532 -- 1000 mL  net 1 lit            8327-8327-01 - Medicaitons Given During Treatment  (last 3 hrs)           Alejo Beamer, RN         Medication Name Action Time Action Route Rate Dose User     gentamicin-sodium citrate lock solution in NS 11/24/22 1430 Given Intra-cannular  1.4 mL Markale Birdsell, RN     gentamicin-sodium citrate lock solution in NS 11/24/22 1430 Given Intra-cannular  1.4 mL Eliezer Champagne, RN                      Patient tolerated treatment in a  Bed.

## 2022-11-24 NOTE — Unmapped (Signed)
HD started for 2.5 hours with UF goal of 1 lit   Problem: Hemodialysis  Goal: Safe, Effective Therapy Delivery  Outcome: Ongoing - Unchanged  Goal: Effective Tissue Perfusion  Outcome: Ongoing - Unchanged  Goal: Absence of Infection Signs and Symptoms  Outcome: Ongoing - Unchanged

## 2022-11-24 NOTE — Unmapped (Signed)
Care Management  Initial Transition Planning Assessment    Patient lives alone in [place] in a 1 story home with 3 steps to enter. Patient didn't use DME or HH services prior to admit to hospital. Patient will need referrals sent out for HD dialysis. Wife or daughter will transport patient home.               General  Care Manager assessed the patient by : In person interview with patient, Medical record review, Discussion with Clinical Care team  Orientation Level: Oriented X4  Functional level prior to admission: Independent  Reason for referral: Discharge Planning    Contact/Decision Maker  Extended Emergency Contact Information  Primary Emergency Contact: [facility]  Address: [address]           [place], [place] [address] [place]  Mobile Phone: [phone]  Relation: Sister    Armed forces operational officer Next of Kin / Guardian / POA / Advance Directives     HCDM (patient stated preference): [name] - Sister - [phone]    Advance Directive (Medical Treatment)  Does patient have an advance directive covering medical treatment?: Patient has advance directive covering medical treatment, copy in chart.  Reason patient does not have an advance directive covering medical treatment:: Patient does not wish to complete one at this time.    Health Care Decision Maker [HCDM] (Medical & Mental Health Treatment)  Healthcare Decision Maker: HCDM documented in the HCDM/Contact Info section.  Information offered on HCDM, Medical & Mental Health advance directives:: Patient given information.         Readmission Information    Have you been hospitalized in the last 30 days?: No     Did the following happen with your discharge?        Patient Information  Lives with: Alone    Type of Residence: Private residence (1 story with 3 steps to enter)        Location/Detail: [address] [place] [address]    Support Systems/Concerns: Family Members    Responsibilities/Dependents at home?: No    Home Care services in place prior to admission?: No     Equipment Currently Used at Home: none       Currently receiving outpatient dialysis?: Yes  Facility providing dialysis (Name/Contact Info): New HD patient, will have to send out referrals    Financial Information       Need for financial assistance?: No       Social Determinants of Health  Social Determinants of Health     Financial Resource Strain: Low Risk  (11/17/2022)    Overall Financial Resource Strain (CARDIA)     Difficulty of Paying Living Expenses: Not very hard   Internet Connectivity: Not on file   Food Insecurity: No Food Insecurity (11/17/2022)    Hunger Vital Sign     Worried About Running Out of Food in the Last Year: Never true     Ran Out of Food in the Last Year: Never true   Tobacco Use: Medium Risk (11/15/2022)    Patient History     Smoking Tobacco Use: Former     Smokeless Tobacco Use: Never     Passive Exposure: Past   Housing/Utilities: Low Risk  (11/17/2022)    Housing/Utilities     Within the past 12 months, have you ever stayed: outside, in a car, in a tent, in an overnight shelter, or temporarily in someone else's home (i.e. couch-surfing)?: No  Are you worried about losing your housing?: No     Within the past 12 months, have you been unable to get utilities (heat, electricity) when it was really needed?: No   Alcohol Use: Not At Risk (09/19/2021)    Alcohol Use     How often do you have a drink containing alcohol?: Never     How many drinks containing alcohol do you have on a typical day when you are drinking?: Not on file     How often do you have 5 or more drinks on one occasion?: Not on file   Transportation Needs: No Transportation Needs (11/17/2022)    PRAPARE - Transportation     Lack of Transportation (Medical): No     Lack of Transportation (Non-Medical): No   Substance Use: Not on file   Health Literacy: Not on file   Physical Activity: Insufficiently Active (07/29/2020)    Received from [facility] visits prior to 08/26/2022.    Exercise Vital Sign     Days of Exercise per Week: 2 days     Minutes of Exercise per Session: 20 min   Interpersonal Safety: Not on file   Stress: No Stress Concern Present (07/29/2020)    Received from [facility] visits prior to 08/26/2022.    [employer] of Occupational Health - Occupational Stress Questionnaire     Feeling of Stress : Not at all   Intimate Partner Violence: Unknown (07/10/2022)    Received from [facility]    HITS     Physically Hurt: Not on file     Insult or Talk Down To: Not on file     Threaten Physical Harm: Not on file     Scream or Curse: Not on file   Depression: Not at risk (06/30/2022)    PHQ-2     PHQ-2 Score: 0   Social Connections: Unknown (07/10/2022)    Received from [employer]    Social Network     Social Network: Not on file       Complex Discharge Information    Is patient identified as a difficult/complex discharge?: No          Discharge Needs Assessment  Concerns to be Addressed: care coordination/care conferences, discharge planning    Clinical Risk Factors: Dialysis    Barriers to taking medications: No       Equipment Needed After Discharge: other (see comments) (CM will follow for DME needs)    Discharge Facility/Level of Care Needs:      Readmission  Risk of Unplanned Readmission Score: UNPLANNED READMISSION SCORE: 28.57%  Predictive Model Details          29% (High)  Factor Value    Calculated 11/24/2022 08:03 30% Number of active inpatient medication orders 56    [place] Risk of Unplanned Readmission Model 9% Number of ED visits in last six months 2     7% ECG/EKG order present in last 6 months     7% Latest calcium low (8.4 mg/dL)     6% Latest BUN high (96 mg/dL)     6% Diagnosis of electrolyte disorder present     6% Current length of stay 7.682 days     5% Charlson Comorbidity Index 6     5% Imaging order present in last 6 months     5% Phosphorous result present     3% Active anticoagulant inpatient medication order present  3% Age 57     3% Latest creatinine high (8.82 mg/dL)     3% Diagnosis of renal failure present     2% Future appointment scheduled     1% Active ulcer inpatient medication order present      Readmitted Within the Last 30 Days? (No if blank)   Patient at risk for readmission?: Yes    Discharge Plan  Screen findings are: Discharge planning needs identified or anticipated (Comment). (Patient will need outpatient dialysis)    Expected Discharge Date: 11/27/2022    Expected Transfer from Critical Care:                 Initial Assessment complete?: Yes

## 2022-11-24 NOTE — Unmapped (Signed)
Pt  remained A & O x 4 and able to express needs . Afebrile this shift  . S/P right internal jugular hemodialysis catheter placed 11/23/22 , First HD initiated . Pt c/o soreness and discomfort  @ the HD catheter site  . Denied N/V , no SOB/Dyspnea . Pain managed with scheduled doses  of Dilaudid as ordered .   Pt also c/o of anxiety and insomnia . PRN doses of Atarax and Melatonin adm as per [facility] with relief .Strict I & O . Assistance with ADLS provided  . Falls and safety precautions reinforced with bed low and locked , SR up x 2 , call bell within reach . Pt resting quietly in bed , no acute distress noted . Will continue with current POC .     Problem: Adult Inpatient Plan of Care  Goal: Plan of Care Review  Outcome: Progressing  Goal: Patient-Specific Goal (Individualized)  Outcome: Progressing  Flowsheets (Taken 11/24/2022 0447)  Patient/Family-Specific Goals (Include Timeframe): Pt will have adequate pain mgmt and remain free from falls and injuries during this shift 7p - 7a .  Individualized Care Needs: V/S ,labs , ACHS , pain mgmt , HD catheter , Oxygenation , telemetry  & cont pulse oximetry monitors  , falls/safety precautions  Anxieties, Fears or Concerns: Pt concerned about pain mgmt & HD cath site .  Goal: Absence of Hospital-Acquired Illness or Injury  Outcome: Progressing  Intervention: Identify and Manage Fall Risk  Recent Flowsheet Documentation  Taken 11/24/2022 0400 by Harlow Ohms, RN  Safety Interventions:   fall reduction program maintained   environmental modification   low bed   nonskid shoes/slippers when out of bed  Taken 11/24/2022 0200 by Harlow Ohms, RN  Safety Interventions:   fall reduction program maintained   environmental modification   low bed   nonskid shoes/slippers when out of bed  Taken 11/24/2022 0000 by Harlow Ohms, RN  Safety Interventions:   fall reduction program maintained   environmental modification   low bed   nonskid shoes/slippers when out of bed  Taken 11/23/2022 2200 by Harlow Ohms, RN  Safety Interventions:   fall reduction program maintained   environmental modification   low bed   nonskid shoes/slippers when out of bed  Taken 11/23/2022 2000 by Harlow Ohms, RN  Safety Interventions:   fall reduction program maintained   environmental modification   low bed   nonskid shoes/slippers when out of bed  Intervention: Prevent Skin Injury  Recent Flowsheet Documentation  Taken 11/23/2022 2000 by Harlow Ohms, RN  Positioning for Skin: Supine/Back  Skin Protection: incontinence pads utilized  Intervention: Prevent and Manage VTE (Venous Thromboembolism) Risk  Recent Flowsheet Documentation  Taken 11/24/2022 0400 by Harlow Ohms, RN  Anti-Embolism Intervention: (VTE : Heparin SQ) Other (Comment)  Taken 11/24/2022 0200 by Annamaria Helling A, RN  Anti-Embolism Intervention: (VTE : Heparin SQ) Other (Comment)  Taken 11/24/2022 0000 by Harlow Ohms, RN  Anti-Embolism Intervention: (VTE : Heparin SQ) Other (Comment)  Taken 11/23/2022 2200 by Annamaria Helling A, RN  Anti-Embolism Intervention: (VTE : Heparin SQ) Other (Comment)  Taken 11/23/2022 2000 by Harlow Ohms, RN  VTE Prevention/Management: anticoagulant therapy  Anti-Embolism Intervention: (VTE : Heparin SQ) Other (Comment)  Intervention: Prevent Infection  Recent Flowsheet Documentation  Taken 11/24/2022 0400 by Harlow Ohms, RN  Infection Prevention: rest/sleep promoted  Taken 11/24/2022 0200 by Harlow Ohms, RN  Infection Prevention: rest/sleep promoted  Taken  11/24/2022 0000 by Harlow Ohms, RN  Infection Prevention: rest/sleep promoted  Taken 11/23/2022 2200 by Harlow Ohms, RN  Infection Prevention: environmental surveillance performed  Taken 11/23/2022 2000 by Harlow Ohms, RN  Infection Prevention: environmental surveillance performed  Goal: Optimal Comfort and Wellbeing  Outcome: Progressing  Goal: Readiness for Transition of Care  Outcome: Progressing  Goal: Rounds/Family Conference  Outcome: Progressing

## 2022-11-24 NOTE — Unmapped (Signed)
Internal Medicine (MEDU) Progress Note    Assessment & Plan:   Richard [name] is a 57 y.o. male with PMH NASH cirrhosis s/p OLT 2013, HFpEF, CKD5, persistent A-fib not on AC, CAD, HTN, OSA not on CPAP who presented to [facility] 5/22 with RLE cellulitis, currently in a prolonged admission due to AKI on CKD, elevated LFTs, and O2 requirement iso volume overload. Patient has now progressed to ESRD and started on HD.    Principal Problem:    ESRD (end stage renal disease) on dialysis (CMS-HCC)  Active Problems:    Hypertension    Liver replaced by transplant (CMS-HCC)    Immunosuppression due to drug therapy (CMS-HCC)    (HFpEF) heart failure with preserved ejection fraction (CMS-HCC)    Severe hyperglycemia due to diabetes mellitus (CMS-HCC)    Cellulitis of right lower extremity    Acute kidney injury superimposed on chronic kidney disease (CMS-HCC)    CAD (coronary artery disease)    Elevated liver enzymes    Hyperphosphatemia  Resolved Problems:    Hypomagnesemia    Hypokalemia    Active Problems    ESRD - Hyperphosphatemia  - Uremia   Pt has established Nephrology (Dr. Galen Manila) and Transplant Nephrology (Dr. Margaretmary Bayley).  Patient presented with elevated creatinine from baseline, creatinine is progressively worsened and patient is struggled with volume overload, resulting in transition to HD 5/30.  Volume status improved s/p HD.  Plan to continue sevelamer, continue HD.  Discussed with [facility], plan for tunneled line placement 6/3.  CM working on dialysis chair placement.  - Nephro following, appreciate recs  - Continue HD per Nephro   - Continue PO sevelamer 2400 mg TID  - Hold Spironolactone, Lasix  - Avoid NSAIDs and Morphine   - Daily BMP, Mg, Phos  - Tunneled Line 6/3    Hypoxemia 2/2 Pulmonary Edema, resolved - OSA not on CPAP  Pt developed hypoxemia (desat to 88% but denied SOB) on 5/27 and endorsed mild SOB on 5/30.  CXR notable for pulmonary edema.  Hypoxemia has resolved s/p HD.  - HD   - Strict I&Os, daily weight    T2DM   Latest A1c 8.3% on 08/04/2022. Pt uses a CGM and endorses well controlled BG with Levemir and Novolog at home.  Continue regimen below.  - Lantus 15u nightly  - Humalog from 35u (home 55u)  - SSI  - POCT BG QID    Elevated LFTs, stable - s/p Liver Transplant in 2013  Hx of liver transplant 11 years ago 2/2 NASH.  On Cellcept and Tacrolimus.  LFTs have improved since admission but remain mildly elevated.  MRCP under general status show ordered.  - Hepatology following, appreciate recs   - Continue home Cellcept, Tacrolimus (goal 2-4, dosing per pharmacy)  - non-con MRCP, will need sedated as he had panic attack with last attempt   - Avoid Tylenol  - Daily HFP    RLE Cellulitis, resolved  Patient with multiple episodes of cellulitis presented with right lower extremity cellulitis. Cellulitis has resolved, will complete 7 days of abx 5/31.   - Bcx 5/22: NGTD  - Bcx 5/24: NGTD  - Antibiotics:    - s/p Clindamycin (5/22)  - s/p Vancomycin (5/23)   - s/p Doxycycline (5/24 - 5/25)   - s/p Ceftriaxone (5/25)   - s/p Cefepime (5/25 - 5/28)   - Linezolid (5/25 - 5/31)   - Ciprofloxacin (5/28 - 5/31)  - Daily CBC  - Pain regimen:  PO dilaudid     Chronic Conditions  Hypertension: Continue amlodipine 10 mg, Coreg 6.25 mg twice daily.   HFpEF - Hx A-Fib - CAD: hold spironolactone, continue carvedilol      Daily Checklist:  Diet: Regular Diet  DVT PPx:  heparin SQ  Electrolytes:  K > 3.6 Mg >2 goal  caution with repletion given ESRD  Code Status: Full Code  Dispo: Home pending tunneled line placement with [facility]    Team Contact Information:   Primary Team: Internal Medicine (MEDU)  Primary Resident: Marja Kays, MD  Resident's Pager: [phone] (Gen MedU Intern - Alvester Morin)    Interval History:   No acute events overnight.  Pt endorses good breathing even without receiving O2 at night.  Normal work of breathing in room air.  No specific complaints today; pt just waiting for the next round of dialysis.     All other systems were reviewed and are negative except as noted in the HPI    Objective:   Temp:  [36.3 ??C (97.3 ??F)-36.8 ??C (98.2 ??F)] 36.8 ??C (98.2 ??F)  Heart Rate:  [60-86] 66  Resp:  [18-20] 18  BP: (120-174)/(70-98) 172/98  SpO2:  [96 %-97 %] 96 %    Physical Exam  Constitutional:       Appearance: Normal appearance. He is not toxic-appearing.   HENT:      Head: Atraumatic.   Cardiovascular:      Rate and Rhythm: Normal rate. Rhythm irregular.      Heart sounds: No murmur heard.  Pulmonary:      Effort: Pulmonary effort is normal.      Breath sounds: Normal breath sounds. No wheezing, rhonchi or rales.   Abdominal:      General: Abdomen is protuberant.   Musculoskeletal:      Right lower leg: Edema present.      Left lower leg: Edema present.   Skin:     Findings: No erythema.      Comments: RLE cellulitis resolved   Neurological:      General: No focal deficit present.      Mental Status: He is alert, oriented to person, place, and time and easily aroused.   Psychiatric:         Mood and Affect: Mood is anxious.         Behavior: Behavior is cooperative.             [name], MS3    I attest that I have reviewed the medical student note and that the components of the history of the present illness, the physical exam, and the assessment and plan documented were performed by me or were performed in my presence by the student where I verified the documentation and performed (or re-performed) the exam and medical decision making.    Jiles Prows, MD  Internal Medicine PGY-1

## 2022-11-24 NOTE — Unmapped (Signed)
Pt alert and oriented x4.Up a lib in room.Falls precautions maintained.PT/OT following.Blood glucose continues to be monitored.Remains on correctional & nutritional insulin.RIJ dialysis catheter intact.No complaints of pain thus far.Remains on scheduled dilauded po.Pt had dialysis treatment today.Will assess upon return to the unit.Updated on plan of care by MD.No further needs voiced.Plan of care continues.  Problem: Adult Inpatient Plan of Care  Goal: Plan of Care Review  Outcome: Progressing  Flowsheets (Taken 11/24/2022 1222)  Progress: improving  Plan of Care Reviewed With: patient  Goal: Patient-Specific Goal (Individualized)  Outcome: Progressing  Flowsheets (Taken 11/24/2022 1222)  Patient/Family-Specific Goals (Include Timeframe): pt will remain free from falls and injuries during this shift 7a-7p  Individualized Care Needs: falls,achs,pain control,HD catheter care, continuous pulse ox moitoring,  Anxieties, Fears or Concerns: none voiced  Goal: Absence of Hospital-Acquired Illness or Injury  Outcome: Progressing  Intervention: Identify and Manage Fall Risk  Recent Flowsheet Documentation  Taken 11/24/2022 1200 by Ileene Rubens, RN  Safety Interventions: fall reduction program maintained  Taken 11/24/2022 1000 by Ileene Rubens, RN  Safety Interventions: fall reduction program maintained  Taken 11/24/2022 0730 by Ileene Rubens, RN  Safety Interventions: fall reduction program maintained  Intervention: Prevent and Manage VTE (Venous Thromboembolism) Risk  Recent Flowsheet Documentation  Taken 11/24/2022 1200 by Ileene Rubens, RN  Anti-Embolism Intervention: (Heparin sub q) Other (Comment)  Taken 11/24/2022 1000 by Ileene Rubens, RN  Anti-Embolism Intervention: (Heparin sub q) Other (Comment)  Taken 11/24/2022 0730 by Ileene Rubens, RN  Anti-Embolism Intervention: (Heparin sub q) Other (Comment)  Intervention: Prevent Infection  Recent Flowsheet Documentation  Taken 11/24/2022 1222 by Ileene Rubens, RN  Infection Prevention: rest/sleep promoted  Goal: Optimal Comfort and Wellbeing  Outcome: Progressing  Goal: Readiness for Transition of Care  Outcome: Progressing  Goal: Rounds/Family Conference  Outcome: Progressing  Flowsheets (Taken 11/24/2022 1222)  Participants:   physician   nursing   patient

## 2022-11-24 NOTE — Unmapped (Signed)
Medicine Procedure Service Follow-up Consult Note    Assessment/Recommendations:    Richard [name] is a 57 y.o. male that presented to [facility] with ESRD (end stage renal disease) on dialysis (CMS-HCC).    The Medicine Procedure Service was consulted for placement of a non-tunneled hemodialysis catheter, which was performed at the bedside with anxiolytic midazolam.  No procedural complications identified.    Thank you for involving [place] in the care of your patient.  We will sign off for now, but please page [place] at [phone] with questions/concerns, or if we may be of further assistance.    ___________________________________________________________________    Subjective:  Some soreness at line site.    Labs/Studies:  Labs and Studies from the last 24hrs per EMR and Reviewed  CXR with line tip in usable position, and no pneumothorax    Objective:  Temp:  [36.3 ??C (97.3 ??F)-36.8 ??C (98.2 ??F)] 36.8 ??C (98.2 ??F)  Heart Rate:  [59-86] 64  Resp:  [18-20] 18  BP: (120-152)/(70-87) 138/71  SpO2:  [96 %-97 %] 96 %      GEN: Sitting up on side of bed in NAD, talking on the phone.  SKIN: Line site dressing clean and dry; site with minimal tenderness and no visible erythema or ecchymosis

## 2022-11-25 LAB — BASIC METABOLIC PANEL
ANION GAP: 11 mmol/L (ref 5–14)
BLOOD UREA NITROGEN: 61 mg/dL — ABNORMAL HIGH (ref 9–23)
BUN / CREAT RATIO: 9
CALCIUM: 8 mg/dL — ABNORMAL LOW (ref 8.7–10.4)
CHLORIDE: 102 mmol/L (ref 98–107)
CO2: 25 mmol/L (ref 20.0–31.0)
CREATININE: 6.75 mg/dL — ABNORMAL HIGH
EGFR CKD-EPI (2021) MALE: 9 mL/min/{1.73_m2} — ABNORMAL LOW (ref >=60)
GLUCOSE RANDOM: 175 mg/dL (ref 70–179)
POTASSIUM: 4 mmol/L (ref 3.4–4.8)
SODIUM: 138 mmol/L (ref 135–145)

## 2022-11-25 LAB — TACROLIMUS LEVEL, TIMED: TACROLIMUS BLOOD: 4 ng/mL

## 2022-11-25 LAB — CBC
HEMATOCRIT: 27.4 % — ABNORMAL LOW (ref 39.0–48.0)
HEMOGLOBIN: 9.1 g/dL — ABNORMAL LOW (ref 12.9–16.5)
MEAN CORPUSCULAR HEMOGLOBIN CONC: 33.2 g/dL (ref 32.0–36.0)
MEAN CORPUSCULAR HEMOGLOBIN: 27.7 pg (ref 25.9–32.4)
MEAN CORPUSCULAR VOLUME: 83.6 fL (ref 77.6–95.7)
MEAN PLATELET VOLUME: 7.7 fL (ref 6.8–10.7)
PLATELET COUNT: 177 10*9/L (ref 150–450)
RED BLOOD CELL COUNT: 3.27 10*12/L — ABNORMAL LOW (ref 4.26–5.60)
RED CELL DISTRIBUTION WIDTH: 14.1 % (ref 12.2–15.2)
WBC ADJUSTED: 5.8 10*9/L (ref 3.6–11.2)

## 2022-11-25 LAB — HEPATIC FUNCTION PANEL
ALBUMIN: 2.4 g/dL — ABNORMAL LOW (ref 3.4–5.0)
ALKALINE PHOSPHATASE: 202 U/L — ABNORMAL HIGH (ref 46–116)
ALT (SGPT): 44 U/L (ref 10–49)
AST (SGOT): 42 U/L — ABNORMAL HIGH (ref <=34)
BILIRUBIN DIRECT: 0.3 mg/dL (ref 0.00–0.30)
BILIRUBIN TOTAL: 0.7 mg/dL (ref 0.3–1.2)
PROTEIN TOTAL: 6.6 g/dL (ref 5.7–8.2)

## 2022-11-25 LAB — PHOSPHORUS: PHOSPHORUS: 8.2 mg/dL — ABNORMAL HIGH (ref 2.4–5.1)

## 2022-11-25 LAB — MAGNESIUM: MAGNESIUM: 2 mg/dL (ref 1.6–2.6)

## 2022-11-25 MED ADMIN — diclofenac sodium (VOLTAREN) 1 % gel 2 g: 2 g | TOPICAL | @ 09:00:00

## 2022-11-25 MED ADMIN — insulin lispro (HumaLOG) injection 35 Units: 35 [IU] | SUBCUTANEOUS | @ 17:00:00

## 2022-11-25 MED ADMIN — tacrolimus (PROGRAF) capsule 1 mg: 1 mg | ORAL | @ 16:00:00

## 2022-11-25 MED ADMIN — mycophenolate (CELLCEPT) capsule 250 mg: 250 mg | ORAL | @ 02:00:00

## 2022-11-25 MED ADMIN — insulin glargine (LANTUS) injection 15 Units: 15 [IU] | SUBCUTANEOUS | @ 02:00:00

## 2022-11-25 MED ADMIN — mycophenolate (CELLCEPT) capsule 250 mg: 250 mg | ORAL | @ 16:00:00

## 2022-11-25 MED ADMIN — gentamicin-sodium citrate lock solution in NS: 1.4 mL | @ 15:00:00

## 2022-11-25 MED ADMIN — diclofenac sodium (VOLTAREN) 1 % gel 2 g: 2 g | TOPICAL | @ 16:00:00

## 2022-11-25 MED ADMIN — HYDROmorphone (DILAUDID) tablet 2 mg: 2 mg | ORAL | @ 04:00:00 | Stop: 2022-11-25

## 2022-11-25 MED ADMIN — carvedilol (COREG) tablet 6.25 mg: 6.25 mg | ORAL | @ 16:00:00

## 2022-11-25 MED ADMIN — tacrolimus (PROGRAF) capsule 1 mg: 1 mg | ORAL | @ 02:00:00

## 2022-11-25 MED ADMIN — menthol (COUGH DROPS) lozenge 1 lozenge: 1 | ORAL | @ 05:00:00

## 2022-11-25 MED ADMIN — amlodipine (NORVASC) tablet 10 mg: 10 mg | ORAL | @ 16:00:00

## 2022-11-25 MED ADMIN — linezolid (ZYVOX) tablet 600 mg: 600 mg | ORAL | @ 02:00:00 | Stop: 2022-11-24

## 2022-11-25 MED ADMIN — heparin (porcine) 5,000 unit/mL injection 7,500 Units: 7500 [IU] | SUBCUTANEOUS | @ 18:00:00

## 2022-11-25 MED ADMIN — insulin lispro (HumaLOG) injection 0-20 Units: 0-20 [IU] | SUBCUTANEOUS | @ 02:00:00

## 2022-11-25 MED ADMIN — carvedilol (COREG) tablet 6.25 mg: 6.25 mg | ORAL | @ 02:00:00

## 2022-11-25 MED ADMIN — insulin lispro (HumaLOG) injection 0-20 Units: 0-20 [IU] | SUBCUTANEOUS | @ 11:00:00

## 2022-11-25 MED ADMIN — heparin (porcine) 5,000 unit/mL injection 7,500 Units: 7500 [IU] | SUBCUTANEOUS | @ 09:00:00

## 2022-11-25 MED ADMIN — hydrOXYzine (ATARAX) tablet 25 mg: 25 mg | ORAL | @ 02:00:00

## 2022-11-25 MED ADMIN — melatonin tablet 3 mg: 3 mg | ORAL | @ 02:00:00

## 2022-11-25 MED ADMIN — HYDROmorphone (DILAUDID) tablet 2 mg: 2 mg | ORAL | @ 09:00:00 | Stop: 2022-11-25

## 2022-11-25 MED ADMIN — heparin (porcine) 5,000 unit/mL injection 7,500 Units: 7500 [IU] | SUBCUTANEOUS | @ 02:00:00

## 2022-11-25 MED ADMIN — insulin lispro (HumaLOG) injection 0-20 Units: 0-20 [IU] | SUBCUTANEOUS | @ 22:00:00

## 2022-11-25 MED ADMIN — insulin lispro (HumaLOG) injection 0-20 Units: 0-20 [IU] | SUBCUTANEOUS | @ 17:00:00

## 2022-11-25 MED ADMIN — guaiFENesin (ROBITUSSIN) oral syrup: 200 mg | ORAL | @ 04:00:00

## 2022-11-25 MED ADMIN — sevelamer (RENVELA) tablet 2,400 mg: 2400 mg | ORAL | @ 16:00:00

## 2022-11-25 NOTE — Unmapped (Signed)
HEMODIALYSIS NURSE PROCEDURE NOTE    Treatment Number:  3 Room/Station:  2 Procedure Date:  11/25/22   Total Treatment Time:  210 Min.    CONSENT:  Written consent was obtained prior to the procedure and is detailed in the medical record. Prior to the start of the procedure, a time out was taken and the identity of the patient was confirmed via name, medical record number and date of birth.     WEIGHTS:  Hemodialysis Pre-Treatment Weights       Date/Time Pre-Treatment Weight (kg) Estimated Dry Weight (kg) Patient Goal Weight (kg) Total Goal Weight (kg)    11/25/22 0730 138.1 kg (304 lb 7.3 oz) --  tbd 1.5 kg (3 lb 4.9 oz) 2.05 kg (4 lb 8.3 oz)    11/24/22 1230 --  utw, weakness, came on bed --  unknown 1 kg (2 lb 3.3 oz) 1.55 kg (3 lb 6.7 oz)             Hemodialysis Post Treatment Weights       Date/Time Post-Treatment Weight (kg) Treatment Weight Change (kg)    11/25/22 1130 136.2 kg (300 lb 4.3 oz) -1.9 kg    11/24/22 1532 --  utw, weakness --          Active Dialysis Orders (168h ago, onward)       Start     Ordered    11/25/22 0919  Hemodialysis inpatient  Every Tue,Thu,Sat      Question Answer Comment   Patient HD Status: Chronic    New Start? Yes    K+ 3 meq/L    Ca++ 2.5 meq/L    Bicarb 35 meq/L    Na+ 137 meq/L    Na+ Modeling no    Dialyzer [id number]    Dialysate Temperature (C) 36    BFR-As tolerated to a maximum of: 400 mL/min see comments   DFR 800 mL/min see comments   Duration of treatment 4 Hr see comments   Dry weight (kg) TBD    Challenge dry weight (kg) yes    Fluid removal (L) 1.5L on 11/25/22    Tubing Adult = 142 ml    Access Site Dialysis Catheter    Access Site Location Right    Keep SBP >: 100        11/25/22 0918                  ACCESS SITE:          Hemodialysis Catheter With Distal Infusion Port 11/23/22 Right Internal jugular 1.4 mL 1.4 mL (Active)   Site Assessment Dry;Intact 11/25/22 1130   Proximal Lumen Status / Patency Gentamicin Citrate Locked 11/25/22 1130   Proximal Lumen Intervention Deaccessed 11/25/22 1130   Medial Lumen Status / Patency Gentamicin Citrate Locked 11/25/22 1130   Medial Lumen Intervention Deaccessed 11/25/22 1130   Lumen 3, Distal Status / Patency Capped 11/25/22 1130   Distal Lumen Intervention Flushed 11/23/22 1315   Dressing Type CHG gel;Occlusive;Transparent 11/25/22 1130   Dressing Status      Dry;Intact/not removed 11/25/22 1130   Dressing Intervention No intervention needed 11/25/22 1130   Dressing Change Due 11/30/22 11/25/22 1130   Line Necessity Reviewed? Y 11/25/22 1130   Line Necessity Indications Yes - Hemodialysis 11/25/22 1130   Line Necessity Reviewed With Neph 11/24/22 2200        Catheter Fill Volumes:  Arterial:  1.4 mL Venous:  1.4 mL   Catheter filled with  1 mg Gentamicin Citrate post procedure.    Patient Lines/Drains/Airways Status       Active Peripheral & Central Intravenous Access       Name Placement date Placement time Site Days    Peripheral IV 11/22/22 Anterior;Right Forearm 11/22/22  1804  Forearm  2    Hemodialysis Catheter With Distal Infusion Port 11/23/22 Right Internal jugular 1.4 mL 1.4 mL 11/23/22  1324  Internal jugular  1                  LAB RESULTS:  Lab Results   Component Value Date    NA 138 11/25/2022    K 4.0 11/25/2022    CL 102 11/25/2022    CO2 25.0 11/25/2022    BUN 61 (H) 11/25/2022    CREATININE 6.75 (H) 11/25/2022    GLU 175 11/25/2022    GLUF 138 10/06/2014    CALCIUM 8.0 (L) 11/25/2022    CAION 4.57 11/20/2022    ICAV 4.88 03/04/2012    PHOS 8.2 (H) 11/25/2022    MG 2.0 11/25/2022    PTH 267.5 (H) 10/14/2021    IRON 95 11/09/2022    LABIRON 36 11/09/2022    TRANSFERRIN 212.0 (L) 11/09/2022    FERRITIN 169.8 11/09/2022    TIBC 267.1 11/09/2022     Lab Results   Component Value Date    WBC 5.8 11/25/2022    HGB 9.1 (L) 11/25/2022    HCT 27.4 (L) 11/25/2022    PLT 177 11/25/2022    PHART 7.36 03/04/2012    PO2ART 88 03/04/2012    PCO2ART 43 03/04/2012    HCO3ART 23.4 03/04/2012    BEART -1.3 03/04/2012 O2SATART 98.1 03/04/2012    PTINR : 05/06/2012    APTT 26.1 04/24/2022        VITAL SIGNS:  Temperature       Date/Time Temp Temp src       11/25/22 1130 36.7 ??C (98 ??F) Oral           Hemodynamics       Date/Time Pulse BP MAP (mmHg) Patient Position    11/25/22 1130 85 178/77 -- Lying    11/25/22 1115 68 161/79 -- Lying    11/25/22 1045 62 164/84 -- Lying    11/25/22 1015 68 156/83 -- Lying    11/25/22 0945 64 165/106 -- Lying    11/25/22 0915 70 154/82 -- Lying    11/25/22 0900 65 141/82 -- Lying    11/25/22 0830 66 128/90 -- Lying          Blood Volume Monitor       Date/Time Blood Volume Change (%) HCT HGB Critline O2 SAT %    11/25/22 1115 -5.9 % 28.4 9.7 65.7    11/25/22 1045 -5.2 % 28.2 9.6 64.8    11/25/22 1015 -7 % 28.7 9.8 63.5    11/25/22 0945 -7.6 % 28.9 9.8 67.5    11/25/22 0915 -6.7 % 28.6 9.7 55    11/25/22 0900 -4.3 % 27.9 9.5 59.5    11/25/22 0830 -3.3 % 27.6 9.4 56.8          Oxygen Therapy       Date/Time Resp SpO2 O2 Device FiO2 (%) O2 Flow Rate (L/min)    11/25/22 1130 20 -- None (Room air) -- --    11/25/22 1115 18 -- None (Room air) -- --    11/25/22 1045 18 -- None (Room air) -- --  11/25/22 1015 18 -- None (Room air) -- --    11/25/22 0945 18 -- None (Room air) -- --    11/25/22 0924 -- --  PT REFUSING -- -- --    11/25/22 0915 18 -- None (Room air) -- --    11/25/22 0900 18 -- None (Room air) -- --    11/25/22 0830 18 -- None (Room air) -- --          Oxygen Connected to Wall:  no    Pre-Hemodialysis Assessment       Date/Time Therapy Number Dialyzer All Machine Alarms Passed Air Detector Dialysis Flow (mL/min)    11/25/22 0730 3 F-180 (98 mLs) Yes Engaged 800 mL/min    11/24/22 1230 2 F-180 (98 mLs) Yes Engaged 600 mL/min      Date/Time Verify Priming Solution Priming Volume Hemodialysis Independent pH Hemodialysis Machine Conductivity (mS/cm) Hemodialysis Independent Conductivity (mS/cm)    11/25/22 0730 0.9% NS 300 mL --  n/a 13.7 mS/cm 13.8 mS/cm    11/24/22 1230 0.9% NS 300 mL -- 13.7 mS/cm 13.5 mS/cm      Date/Time Bicarb Conductivity Residual Bleach Negative Free Chlorine Total Chlorine Chloramine    11/25/22 0730 -- Yes -- 0 --    11/24/22 1230 -- Yes -- 0 --          Pre-Hemodialysis Treatment Comments       Date/Time Pre-Hemodialysis Comments    11/25/22 0730 alert, dialyzing in recliner    11/24/22 1230 pt aert, came on bed          Hemodialysis Treatment       Date/Time Blood Flow Rate (mL/min) Arterial Pressure (mmHg) Venous Pressure (mmHg) Transmembrane Pressure (mmHg)    11/25/22 1130 -- -- -- --    11/25/22 1115 400 mL/min -143 mmHg 143 mmHg 66 mmHg    11/25/22 1045 400 mL/min -140 mmHg 140 mmHg 65 mmHg    11/25/22 1015 400 mL/min -142 mmHg 146 mmHg 64 mmHg    11/25/22 0945 400 mL/min -144 mmHg 144 mmHg 64 mmHg    11/25/22 0915 400 mL/min -137 mmHg 143 mmHg 65 mmHg    11/25/22 0900 400 mL/min -134 mmHg 139 mmHg 65 mmHg    11/25/22 0830 400 mL/min -120 mmHg 143 mmHg 68 mmHg    11/25/22 0800 150 mL/min -50 mmHg 60 mmHg 70 mmHg      Date/Time Ultrafiltration Rate (mL/hr) Ultrafiltrate Removed (mL) Dialysate Flow Rate (mL/min) KECN (Kecn)    11/25/22 1130 -- 2050 mL -- --    11/25/22 1115 590 mL/hr 1910 mL 800 ml/min --    11/25/22 1045 590 mL/hr 1618 mL 800 ml/min --    11/25/22 1015 590 mL/hr 1329 mL 800 ml/min --    11/25/22 0945 590 mL/hr 1036 mL 800 ml/min --    11/25/22 0915 590 mL/hr 744 mL 800 ml/min --    11/25/22 0900 590 mL/hr 598 mL 800 ml/min --    11/25/22 0830 590 mL/hr 307 mL 800 ml/min --    11/25/22 0800 590 mL/hr 0 mL 800 ml/min --          Hemodialysis Treatment Comments       Date/Time Intra-Hemodialysis Comments    11/25/22 1130 Tx completed. rinseback completed by [facility]    11/25/22 1115 VSS, unable to give Ferrlecit d/t not enough time. Dr Jimmye Norman notified , will give it next tx.    11/25/22 1045 VSS, pt resting    11/25/22 1015 VSS,  pt resting    11/25/22 0945 VSS, pt resting    11/25/22 0930 eating crackers    11/25/22 0915 resumed care. Dr Chinita Pester rounding 11/25/22 0850 reported to Kim,NA for break    11/25/22 0830 VSS, tolerating tx    11/25/22 0800 HD initiated          Post Treatment       Date/Time Rinseback Volume (mL) On Line Clearance: spKt/V Total Liters Processed (L/min) Dialyzer Clearance    11/25/22 1130 300 mL -- 80.9 L/min Moderately streaked    11/24/22 1532 300 mL --  na 44.6 L/min Lightly streaked            Post Hemodialysis Treatment Comments       Date/Time Post-Hemodialysis Comments    11/25/22 1130 alert and stable          POST TREATMENT ASSESSMENT:  General appearance:  alert  Neurological:  Grossly normal  Lungs:  clear to auscultation bilaterally  Hearts:  regular rate and rhythm, S1, S2 normal, no murmur, click, rub or gallop  Abdomen:  soft, non-tender; bowel sounds normal; no masses,  no organomegaly  Pulses:    Skin:  Skin color, texture, turgor normal. No rashes or lesions    Hemodialysis I/O       Date/Time Total Hemodialysis Replacement Volume (mL) Total Ultrafiltrate Output (mL)    11/25/22 1130 -- 1500 mL          [id number] - Medicaitons Given During Treatment  (last 4 hrs)           ** NO USER **         Medication Name Action Time Action Route Rate Dose User     [Provider Hold] spironolactone (ALDACTONE) tablet 25 mg On hold since Fri 11/17/2022 at 2323 until manually unheld; held by Achille Rich, MDHold Reason: Abnormal lab 11/25/22 0900 Provider Hold Dose Oral   ** No User **            Victorino December, RN         Medication Name Action Time Action Route Rate Dose User     gentamicin-sodium citrate lock solution in NS 11/25/22 1127 Given Intra-cannular  1.4 mL Victorino December, RN     gentamicin-sodium citrate lock solution in NS 11/25/22 1127 Given Intra-cannular  1.4 mL Victorino December, RN     sodium ferric gluconate (FERRLECIT) 125 mg in sodium chloride (NS) 0.9 % 100 mL IVPB 11/25/22 1125 Not Given Intravenous 120 mL/hr 125 mg Ileana Ladd Loletta Parish, RN            Ileene Rubens, RN Medication Name Action Time Action Route Rate Dose User     amlodipine (NORVASC) tablet 10 mg 11/25/22 1154 Given Oral  10 mg Ileene Rubens, RN     carvedilol (COREG) tablet 6.25 mg 11/25/22 1155 Given Oral  6.25 mg Oluoch, Eleonora E, RN     fluticasone propionate (FLONASE) 50 mcg/actuation nasal spray 1 spray 11/25/22 1154 Refused Each Nare  1 spray Oluoch, Eleonora E, RN     mycophenolate (CELLCEPT) capsule 250 mg 11/25/22 1154 Given Oral  250 mg Ileene Rubens, RN     pantoprazole (Protonix) EC tablet 40 mg 11/25/22 1155 Refused Oral  40 mg Ileene Rubens, RN     polyethylene glycol (MIRALAX) packet 17 g 11/25/22 1155 Refused Oral  17 g Ileene Rubens, RN     sevelamer (RENVELA)  tablet 2,400 mg 11/25/22 1156 Given Oral  2,400 mg Ileene Rubens, RN     tacrolimus (PROGRAF) capsule 1 mg 11/25/22 1155 Given Oral  1 mg Ileene Rubens, RN                      Patient tolerated treatment in a  Dialysis Recliner.

## 2022-11-25 NOTE — Unmapped (Signed)
[facility] Nephrology Hemodialysis Procedure Note     11/25/2022    Richard [name] was seen and examined on hemodialysis    CHIEF COMPLAINT: End Stage Renal Disease    INTERVAL HISTORY: Doing well. Had some cramping after dialysis yesterday.     DIALYSIS TREATMENT DATA:  Estimated Dry Weight (kg):  (tbd) Patient Goal Weight (kg): 1.5 kg (3 lb 4.9 oz)   Pre-Treatment Weight (kg): 138.1 kg (304 lb 7.3 oz)    Dialysis Bath  Bath: 3 K+ / 2.5 Ca+  Dialysate Na (mEq/L): 137 mEq/L  Dialysate HCO3 (mEq/L): 35 mEq/L Dialyzer: F-180 (98 mLs)   Blood Flow Rate (mL/min): 400 mL/min Dialysis Flow (mL/min): 800 mL/min   Machine Temperature (C): 36.5 ??C (97.7 ??F)      PHYSICAL EXAM:  Vitals:  Temp:  [36.6 ??C (97.9 ??F)-37.1 ??C (98.8 ??F)] 36.9 ??C (98.4 ??F)  Heart Rate:  [60-68] 65  BP: (126-174)/(67-98) 141/82  MAP (mmHg):  [85-118] 85    General: healthy, currently dialyzing in a Hemodialysis Recliner  Pulmonary: normal respiratory effort  Cardiovascular: regular rate and rhythm  Extremities: 1+  edema  Access: Right IJ tunneled catheter     LAB DATA:  Lab Results   Component Value Date    NA 138 11/24/2022    K 4.0 11/24/2022    CL 102 11/24/2022    CO2 22.0 11/24/2022    BUN 80 (H) 11/24/2022    CREATININE 8.22 (H) 11/24/2022    GLUCOSE 196 03/03/2012    CALCIUM 8.3 (L) 11/24/2022    MG 2.1 11/24/2022    PHOS 10.6 (H) 11/24/2022    ALBUMIN 2.7 (L) 11/24/2022      Lab Results   Component Value Date    HCT 27.4 (L) 11/25/2022    WBC 5.8 11/25/2022        ASSESSMENT/PLAN:  End Stage Renal Disease on Intermittent Hemodialysis:  UF goal: 1.5L as tolerated  Adjust medications for a GFR <10  Avoid nephrotoxic agents  Last HD Treatment:Started (11/25/22)     Bone Mineral Metabolism:  Lab Results   Component Value Date    CALCIUM 8.3 (L) 11/24/2022    CALCIUM 8.4 (L) 11/23/2022    Lab Results   Component Value Date    ALBUMIN 2.7 (L) 11/24/2022    ALBUMIN 2.8 (L) 11/23/2022      Lab Results   Component Value Date    PHOS 10.6 (H) 11/24/2022 PHOS 11.8 (H) 11/23/2022    Lab Results   Component Value Date    PTH 267.5 (H) 10/14/2021      Labs appropriate, no changes.    Anemia:   Lab Results   Component Value Date    HGB 9.1 (L) 11/25/2022    HGB 9.5 (L) 11/24/2022    HGB 10.0 (L) 11/21/2022    Iron Saturation (%)   Date Value Ref Range Status   11/09/2022 36 % Final   05/15/2012 39 20 - 50 % Final      Lab Results   Component Value Date    FERRITIN 169.8 11/09/2022       Anemia labs appropriate, no changes.    Vascular Access:  Vascular Access functioning well - no need for intervention  Blood Flow Rate (mL/min): 400 mL/min    IV Antibiotics to be administered at discharge:  No    This procedure was fully reviewed with the patient and/or their decision-maker. The risks, benefits, and alternatives were discussed prior to the  procedure. All questions were answered and written informed consent was obtained.    Elinora Weigand Clydene Fake, MD  [place] [facility] of Nephrology & Hypertension

## 2022-11-25 NOTE — Unmapped (Signed)
#  3 HD treatment. 3.5 hours dialysis. Aim for 1.5L UF as tolerated.  HD catheter accessed w/o problems.  Labs drawn as ordered.    Problem: Hemodialysis  Goal: Safe, Effective Therapy Delivery  Outcome: Ongoing - Unchanged  Goal: Effective Tissue Perfusion  Outcome: Ongoing - Unchanged  Goal: Absence of Infection Signs and Symptoms  Outcome: Ongoing - Unchanged

## 2022-11-25 NOTE — Unmapped (Signed)
Pt alert and oriented x 4.Falls precautions in place.Up ad lib in room.No complaints of pain.HD treatment today for 3.5 hours.1.5 Liters of fluid removed.Tolerated session well.VSS upon return to unit.Continues to refuse continuous pulse ox monitoring.MD and telemetry made aware.Pt also given education.Updated on plan of care by.No further needs voiced.VSS.Call bell within reach.plan of care continues.  Problem: Adult Inpatient Plan of Care  Goal: Plan of Care Review  Outcome: Progressing  Flowsheets (Taken 11/25/2022 1454)  Progress: improving  Plan of Care Reviewed With: patient  Goal: Patient-Specific Goal (Individualized)  Outcome: Progressing  Flowsheets (Taken 11/25/2022 1454)  Patient/Family-Specific Goals (Include Timeframe): Pt will remain free from falls and injuries during this shift 7a-7p  Individualized Care Needs: achs,HD,pain control,falls,PT/OT  Anxieties, Fears or Concerns: none voiced  Goal: Absence of Hospital-Acquired Illness or Injury  Outcome: Progressing  Intervention: Identify and Manage Fall Risk  Flowsheets  Taken 11/25/2022 1454  Safety Interventions: fall reduction program maintained  Taken 11/25/2022 1200  Safety Interventions: fall reduction program maintained  Taken 11/25/2022 0725  Safety Interventions: fall reduction program maintained  Intervention: Prevent and Manage VTE (Venous Thromboembolism) Risk  Recent Flowsheet Documentation  Taken 11/25/2022 1200 by Ileene Rubens, RN  Anti-Embolism Intervention: (heparin) Other (Comment)  Taken 11/25/2022 0725 by Ileene Rubens, RN  Anti-Embolism Intervention: (heparin) Other (Comment)  Intervention: Prevent Infection  Recent Flowsheet Documentation  Taken 11/25/2022 1454 by Ileene Rubens, RN  Infection Prevention: rest/sleep promoted  Goal: Optimal Comfort and Wellbeing  Outcome: Progressing  Intervention: Monitor Pain and Promote Comfort  Recent Flowsheet Documentation  Taken 11/25/2022 1454 by Ileene Rubens, RN  Pain Management Interventions: pain management plan reviewed with patient/caregiver  Goal: Readiness for Transition of Care  Outcome: Progressing  Goal: Rounds/Family Conference  Outcome: Progressing  Flowsheets (Taken 11/25/2022 1454)  Participants:   patient   physician   nursing

## 2022-11-25 NOTE — Unmapped (Addendum)
Pt  remained A & O x 4 and able to express needs . Afebrile this shift  . Pt c/o soreness  @ the HD catheter site  , Voltaren gel applied as ordered . Denied N/V , no SOB/Dyspnea . Pain  well managed with scheduled doses  of Dilaudid  adm as per MAR . Pt  c/o of  cough , anxiety and insomnia . PRN doses of , robitussin , Cough lozenge , Atarax and Melatonin adm as per [facility] with relief . Strict I & O . Assistance with ADLS provided  . Falls and safety precautions reinforced with bed low and locked , SR up x 2 , call bell within reach . Pt resting quietly in bed , no acute distress noted . Will continue with current POC . Daughter , son-in-law and grandson in for a visit this shift and very supportive . Dialysis scheduled for 0730 on 11/25/22 .     Problem: Adult Inpatient Plan of Care  Goal: Plan of Care Review  Outcome: Progressing  Goal: Patient-Specific Goal (Individualized)  Outcome: Progressing  Flowsheets (Taken 11/25/2022 0457)  Patient/Family-Specific Goals (Include Timeframe): Pt will have adequate pain mgmt and remain free from falls and injuries during this shfit 7p - 7a .  Individualized Care Needs: V/S , labs ,  ACHS , pain mgmt , cough , insomnia , Cont. pulse oximetry , falls/safety precautions .  Anxieties, Fears or Concerns: Pt concerned about cough mgmt  Goal: Absence of Hospital-Acquired Illness or Injury  Outcome: Progressing  Intervention: Identify and Manage Fall Risk  Recent Flowsheet Documentation  Taken 11/25/2022 0400 by Harlow Ohms, RN  Safety Interventions:   fall reduction program maintained   environmental modification   low bed   nonskid shoes/slippers when out of bed  Taken 11/25/2022 0200 by Harlow Ohms, RN  Safety Interventions:   fall reduction program maintained   environmental modification   low bed   nonskid shoes/slippers when out of bed  Taken 11/25/2022 0000 by Harlow Ohms, RN  Safety Interventions:   fall reduction program maintained   environmental modification low bed   nonskid shoes/slippers when out of bed  Taken 11/24/2022 2200 by Harlow Ohms, RN  Safety Interventions:   fall reduction program maintained   environmental modification   low bed  Taken 11/24/2022 2000 by Harlow Ohms, RN  Safety Interventions:   fall reduction program maintained   environmental modification   low bed   nonskid shoes/slippers when out of bed  Intervention: Prevent Skin Injury  Recent Flowsheet Documentation  Taken 11/24/2022 2000 by Harlow Ohms, RN  Positioning for Skin: Supine/Back  Skin Protection: incontinence pads utilized  Intervention: Prevent and Manage VTE (Venous Thromboembolism) Risk  Recent Flowsheet Documentation  Taken 11/25/2022 0400 by Harlow Ohms, RN  Anti-Embolism Intervention: (VTE : Heparin SQ) Other (Comment)  Taken 11/25/2022 0200 by Annamaria Helling A, RN  Anti-Embolism Intervention: (VTE : Heparin SQ) Other (Comment)  Taken 11/25/2022 0000 by Harlow Ohms, RN  Anti-Embolism Intervention: (VTE : Heparin SQ) Other (Comment)  Taken 11/24/2022 2200 by Annamaria Helling A, RN  Anti-Embolism Intervention: (VTE : Heparin SQ) Other (Comment)  Taken 11/24/2022 2000 by Harlow Ohms, RN  VTE Prevention/Management: anticoagulant therapy  Anti-Embolism Intervention: (VTE : Heparin SQ) Other (Comment)  Intervention: Prevent Infection  Recent Flowsheet Documentation  Taken 11/25/2022 0400 by Harlow Ohms, RN  Infection Prevention: rest/sleep promoted  Taken 11/25/2022 0200 by Melburn Popper, Colton Tassin A,  RN  Infection Prevention: rest/sleep promoted  Taken 11/25/2022 0000 by Harlow Ohms, RN  Infection Prevention: environmental surveillance performed  Taken 11/24/2022 2200 by Harlow Ohms, RN  Infection Prevention: environmental surveillance performed  Taken 11/24/2022 2000 by Harlow Ohms, RN  Infection Prevention: environmental surveillance performed  Goal: Optimal Comfort and Wellbeing  Outcome: Progressing  Goal: Readiness for Transition of Care  Outcome: Progressing  Goal: Rounds/Family Conference  Outcome: Progressing

## 2022-11-25 NOTE — Unmapped (Signed)
Internal Medicine (MEDU) Progress Note    Assessment & Plan:   Richard [name] is a 57 y.o. male with PMH NASH cirrhosis s/p OLT 2013, HFpEF, CKD5, persistent A-fib not on AC, CAD, HTN, OSA not on CPAP who presented to [facility] 5/22 with RLE cellulitis, currently in a prolonged admission due to AKI on CKD, elevated LFTs, and O2 requirement iso volume overload, now with progression to oliguric CKD stage V and tolerating HD via RIJ trialysis. LFTs and O2 requirement are improved.     Principal Problem:    ESRD (end stage renal disease) on dialysis (CMS-HCC)  Active Problems:    Hypertension    Liver replaced by transplant (CMS-HCC)    Immunosuppression due to drug therapy (CMS-HCC)    (HFpEF) heart failure with preserved ejection fraction (CMS-HCC)    Severe hyperglycemia due to diabetes mellitus (CMS-HCC)    Cellulitis of right lower extremity    Acute kidney injury superimposed on chronic kidney disease (CMS-HCC)    CAD (coronary artery disease)    Elevated liver enzymes    Hyperphosphatemia  Resolved Problems:    Hypomagnesemia    Hypokalemia    Active Problems    ESRD   Has been tolerating recent sessions of dialysis, will obtain more permanent access with tunneled line ~ 6/3. Volume status now improved, remains on room air. Continuing to monitor daily electrolytes. CM working on dialysis chair placement.  - Nephro following, appreciate recs  - Continue HD per Nephro   - Continue PO sevelamer 2400 mg TID  - Hold Spironolactone, Lasix  - Avoid NSAIDs and Morphine   - Daily BMP, Mg, Phos  - Tunneled Line 6/3    T2DM   Recently with improvement in blood glucoses that have ranged from 1 10-1 80 over the last 24 hours.  Continue regimen below.  - Lantus 15u nightly  - Humalog 35u at mealtimes (TID) (home 55u)  - SSI  - POCT BG QID    Anemia of chronic disease  Hemoglobin initially 13 and downtrended to 10 though has been stable since 5/25.  No apparent blood loss, no bright red blood per rectum or melena or hematuria. Has been hemodynamically stable.  -Epo to be set up with HD as outpatient  -Daily CBC  -Monitor for bleeding    Elevated LFTs, stable - s/p Liver Transplant in 2013  Overall improved, likely with acute injury in the setting of recent cellulitis, has been difficult to obtain MRCP as he requires general anesthesia for having had a panic attack during last attempt.   - Hepatology following, appreciate recs   - Continue home Cellcept, Tacrolimus (goal 2-4, dosing per pharmacy)  - Non-con MRCP, will need sedated as he had panic attack with last attempt, this may no longer be needed or urgent at this time  - Ok to dose Tylenol <4g per day  - Daily HFP    RLE Cellulitis, resolved  Patient with multiple episodes of cellulitis presented with right lower extremity cellulitis. Cellulitis has resolved, will complete 7 days of abx 5/31.   - Bcx 5/22: NGTD  - Bcx 5/24: NGTD  - Antibiotics:    - s/p Clindamycin (5/22)  - s/p Vancomycin (5/23)   - s/p Doxycycline (5/24 - 5/25)   - s/p Ceftriaxone (5/25)   - s/p Cefepime (5/25 - 5/28)   - Linezolid (5/25 - 5/31)   - Ciprofloxacin (5/28 - 5/31)  - Daily CBC  - Pain regimen: PO dilaudid 2  mg every 6 hours as needed, will space to q8h     Chronic Conditions  Hypertension: Continue amlodipine 10 mg, Coreg 6.25 mg twice daily.   HFpEF - Hx A-Fib - CAD: hold spironolactone iso worsening renal function, continue carvedilol      Daily Checklist:  Diet: Regular Diet  DVT PPx:  heparin SQ  Electrolytes:  K > 3.6 Mg >2 goal  caution with repletion given ESRD  Code Status: Full Code  Dispo: Home pending tunneled line placement with VIR    Team Contact Information:   Primary Team: Internal Medicine (MEDU)  Primary Resident: Barnett Abu, MD  Resident's Pager: [phone] [facility])    Interval History:   No acute events overnight.  He had 1 L removed during HD yesterday.  Remains on room air and had concerns regarding the line sticking out of his neck and how he would be perceived at work.  All other systems were reviewed and are negative except as noted in the HPI    Objective:   Temp:  [36.6 ??C (97.9 ??F)-37.1 ??C (98.8 ??F)] 37.1 ??C (98.8 ??F)  Heart Rate:  [60-68] 68  Resp:  [18-20] 20  BP: (126-174)/(68-98) 151/68  SpO2:  [91 %-96 %] 92 %    General: Seated upright in no acute distress  Cardiovascular: Regular rate and rhythm   Pulmonary: Breathing comfortably without crackles or wheezes on posterior auscultation  Extremities: no erythema of lower extremities

## 2022-11-25 NOTE — Unmapped (Signed)
Tacrolimus Therapeutic Monitoring Pharmacy Note    Richard [name] is a 57 y.o. male continuing tacrolimus.     Indication: Liver transplant     Date of Transplant:  2013       Prior Dosing Information: Current regimen 1 mg BID      Source(s) of information used to determine prior to admission dosing: Home Medication List or Clinic Note    Goals:  Therapeutic Drug Levels  Tacrolimus trough goal:  2-4 ng/mL  (per 09/16/21 Clinic Note, most recent note Identified.    Additional Clinical Monitoring/Outcomes  Monitor renal function (SCr and urine output) and liver function (LFTs)  Monitor for signs/symptoms of adverse events (e.g., hyperglycemia, hyperkalemia, hypomagnesemia, hypertension, headache, tremor)    Results:     Pharmacokinetic Considerations and Significant Drug Interactions:  Concurrent hepatotoxic medications: Doxycycline (5/24.Marland Kitchen)  Concurrent CYP3A4 substrates/inhibitors: Amlodipine (homemed)  Concurrent nephrotoxic medications:   Furosemide (IV boluses) + Metolazone    Tacrolimus Dosing  Tacro 1mg  BID (5/23.Marland KitchenMarland KitchenMarland Kitchen)      Tacrolimus level  5/24 = 2.2 ng/mL at 0445, drawn ~6.75h after last dose  5/27 = 3.1 ng/mL at 0923  5/28 = 3.7 ng/mL at 0731  5/30 =  4.4 ng/mL at 0900 -- HD(to start??)  06/01 = 4.0 ng/mL at 0800      Assessment/Plan:  5/30 possibly starting HD in next day or so.      Recommendedation(s)  Continue current regimen of 1 mg BID    Follow-up  Tacrolimus Levels:   Every other Day with AM labs drawn between 0600 - 0800 PRIOR to morning dose.  Dose due 0800  A pharmacist will continue to monitor and recommend levels as appropriate    Please page service pharmacist with questions/clarifications.    Drue Flirt, [facility]  November 25, 2022 2:46 PM

## 2022-11-26 LAB — TACROLIMUS LEVEL, TIMED: TACROLIMUS BLOOD: 4.1 ng/mL

## 2022-11-26 LAB — BASIC METABOLIC PANEL
ANION GAP: 7 mmol/L (ref 5–14)
BLOOD UREA NITROGEN: 34 mg/dL — ABNORMAL HIGH (ref 9–23)
BUN / CREAT RATIO: 7
CALCIUM: 8.4 mg/dL — ABNORMAL LOW (ref 8.7–10.4)
CHLORIDE: 102 mmol/L (ref 98–107)
CO2: 27 mmol/L (ref 20.0–31.0)
CREATININE: 4.89 mg/dL — ABNORMAL HIGH
EGFR CKD-EPI (2021) MALE: 13 mL/min/{1.73_m2} — ABNORMAL LOW (ref >=60)
GLUCOSE RANDOM: 140 mg/dL (ref 70–179)
POTASSIUM: 4 mmol/L (ref 3.4–4.8)
SODIUM: 136 mmol/L (ref 135–145)

## 2022-11-26 LAB — CBC
HEMATOCRIT: 26.8 % — ABNORMAL LOW (ref 39.0–48.0)
HEMOGLOBIN: 9 g/dL — ABNORMAL LOW (ref 12.9–16.5)
MEAN CORPUSCULAR HEMOGLOBIN CONC: 33.5 g/dL (ref 32.0–36.0)
MEAN CORPUSCULAR HEMOGLOBIN: 28 pg (ref 25.9–32.4)
MEAN CORPUSCULAR VOLUME: 83.5 fL (ref 77.6–95.7)
MEAN PLATELET VOLUME: 7.5 fL (ref 6.8–10.7)
PLATELET COUNT: 159 10*9/L (ref 150–450)
RED BLOOD CELL COUNT: 3.21 10*12/L — ABNORMAL LOW (ref 4.26–5.60)
RED CELL DISTRIBUTION WIDTH: 13.5 % (ref 12.2–15.2)
WBC ADJUSTED: 5.7 10*9/L (ref 3.6–11.2)

## 2022-11-26 LAB — HEPATIC FUNCTION PANEL
ALBUMIN: 2.5 g/dL — ABNORMAL LOW (ref 3.4–5.0)
ALKALINE PHOSPHATASE: 192 U/L — ABNORMAL HIGH (ref 46–116)
ALT (SGPT): 43 U/L (ref 10–49)
AST (SGOT): 49 U/L — ABNORMAL HIGH (ref <=34)
BILIRUBIN DIRECT: 0.4 mg/dL — ABNORMAL HIGH (ref 0.00–0.30)
BILIRUBIN TOTAL: 0.9 mg/dL (ref 0.3–1.2)
PROTEIN TOTAL: 6.3 g/dL (ref 5.7–8.2)

## 2022-11-26 LAB — MAGNESIUM: MAGNESIUM: 2 mg/dL (ref 1.6–2.6)

## 2022-11-26 LAB — PHOSPHORUS: PHOSPHORUS: 5.6 mg/dL — ABNORMAL HIGH (ref 2.4–5.1)

## 2022-11-26 MED ADMIN — carvedilol (COREG) tablet 6.25 mg: 6.25 mg | ORAL | @ 14:00:00

## 2022-11-26 MED ADMIN — heparin (porcine) 5,000 unit/mL injection 7,500 Units: 7500 [IU] | SUBCUTANEOUS | @ 09:00:00

## 2022-11-26 MED ADMIN — insulin lispro (HumaLOG) injection 0-20 Units: 0-20 [IU] | SUBCUTANEOUS | @ 01:00:00

## 2022-11-26 MED ADMIN — tacrolimus (PROGRAF) capsule 1 mg: 1 mg | ORAL | @ 14:00:00

## 2022-11-26 MED ADMIN — mycophenolate (CELLCEPT) capsule 250 mg: 250 mg | ORAL | @ 01:00:00

## 2022-11-26 MED ADMIN — heparin (porcine) 5,000 unit/mL injection 7,500 Units: 7500 [IU] | SUBCUTANEOUS | @ 17:00:00

## 2022-11-26 MED ADMIN — amlodipine (NORVASC) tablet 10 mg: 10 mg | ORAL | @ 14:00:00

## 2022-11-26 MED ADMIN — heparin (porcine) 5,000 unit/mL injection 7,500 Units: 7500 [IU] | SUBCUTANEOUS | @ 01:00:00

## 2022-11-26 MED ADMIN — insulin lispro (HumaLOG) injection 35 Units: 35 [IU] | SUBCUTANEOUS | @ 17:00:00

## 2022-11-26 MED ADMIN — sevelamer (RENVELA) tablet 1,600 mg: 1600 mg | ORAL | @ 17:00:00

## 2022-11-26 MED ADMIN — insulin lispro (HumaLOG) injection 0-20 Units: 0-20 [IU] | SUBCUTANEOUS | @ 17:00:00

## 2022-11-26 MED ADMIN — carvedilol (COREG) tablet 6.25 mg: 6.25 mg | ORAL | @ 01:00:00

## 2022-11-26 MED ADMIN — diclofenac sodium (VOLTAREN) 1 % gel 2 g: 2 g | TOPICAL | @ 22:00:00

## 2022-11-26 MED ADMIN — insulin glargine (LANTUS) injection 15 Units: 15 [IU] | SUBCUTANEOUS | @ 01:00:00

## 2022-11-26 MED ADMIN — mycophenolate (CELLCEPT) capsule 250 mg: 250 mg | ORAL | @ 14:00:00

## 2022-11-26 MED ADMIN — tacrolimus (PROGRAF) capsule 1 mg: 1 mg | ORAL | @ 01:00:00

## 2022-11-26 NOTE — Unmapped (Signed)
Internal Medicine (MEDU) Progress Note    Assessment & Plan:   Richard [name] is a 57 y.o. male with PMH NASH cirrhosis s/p OLT 2013, HFpEF, CKD5, persistent A-fib not on AC, CAD, HTN, OSA not on CPAP who presented to [facility] 5/22 with RLE cellulitis, currently in a prolonged admission due to AKI on CKD, elevated LFTs, and O2 requirement iso volume overload, now with progression to ESRD and tolerating HD via RIJ trialysis.    Principal Problem:    ESRD (end stage renal disease) on dialysis (CMS-HCC)  Active Problems:    Hypertension    Liver replaced by transplant (CMS-HCC)    Immunosuppression due to drug therapy (CMS-HCC)    (HFpEF) heart failure with preserved ejection fraction (CMS-HCC)    Severe hyperglycemia due to diabetes mellitus (CMS-HCC)    Cellulitis of right lower extremity    Acute kidney injury superimposed on chronic kidney disease (CMS-HCC)    CAD (coronary artery disease)    Elevated liver enzymes    Hyperphosphatemia  Resolved Problems:    Hypomagnesemia    Hypokalemia    Active Problems    ESRD - Hyperphosphatemia, improving   Has been tolerating recent sessions of dialysis, will obtain more permanent access with tunneled line on 6/3.  Volume status now improved.  BUN  and phosphorous levels with significant improvements.  Phos 11.8-->5.6; will reduce sevelamer to 1600 mg TID.  Continuing to monitor daily electrolytes.  CM working on dialysis chair placement.  Plan for tunneled line with VIR 6/3, patient will be n.p.o. at midnight.  - Nephro following, appreciate recs  - Continue HD per Nephro   - Tunneled Line 6/3, NPO at midnight   - Decrease PO sevelamer 2400-->1600 mg TID  - Hold Spironolactone, Lasix  - Avoid NSAIDs and Morphine   - Daily BMP, Mg, Phos    T2DM   BG stable without hypoglycemic episodes with current regimen.  BG mostly in 110-180 range.  Continue regimen below.  - Lantus 15u nightly  - Humalog 35u at mealtimes (TID) (home 55u)  - SSI  - POCT BG QID    Elevated LFTs, stable - s/p Liver Transplant in 2013  LFTs remain mildly elevated but overall improved since admission.  Transaminitis is most likely due to congestive hepatopathy, will continue to trend LFTs daily to assess for improvement with dialysis.  Continuing to attempt obtain MRCP per hepatology, but patient requires general anesthesia.  May need to defer to outpatient  - Continue home Cellcept, Tacrolimus (goal 2-4, dosing per pharmacy)  - Non-con MRCP with anesthesia   - Ok to dose Tylenol <4g per day  - Daily HFP    OSA, not on CPAP  Pt w/ dx of OSA with distant hx of sleep study.  Pt occasionally desaturates at night but has no breathing issue while awake.  O2 is 95% in RA.  Pt does not require continuous O2 monitoring or night time O2.  Lungs clear bilaterally.  Pt to follow up with outpatient sleep study.  - Outpatient sleep study (order was put in on 11/16/2022)    Anemia of chronic disease  Hemoglobin initially 13 and downtrended to 10 though has been stable since 5/25.  No apparent blood loss, no bright red blood per rectum or melena or hematuria. Has been hemodynamically stable.  -Epo to be set up with HD as outpatient  -Daily CBC  -Monitor for bleeding    RLE Cellulitis, resolved  Patient with multiple episodes of  cellulitis presented with right lower extremity cellulitis. Cellulitis has resolved with abx course as below.   - Bcx 5/22: NGTD  - Bcx 5/24: NGTD  - Antibiotics:    - s/p Clindamycin (5/22)  - s/p Vancomycin (5/23)   - s/p Doxycycline (5/24 - 5/25)   - s/p Ceftriaxone (5/25)   - s/p Cefepime (5/25 - 5/28)   - s/p Linezolid (5/25 - 5/31)   - s/p Ciprofloxacin (5/28 - 5/31)  - Daily CBC      Chronic Conditions  Hypertension: Continue amlodipine 10 mg, Coreg 6.25 mg twice daily.   HFpEF - Hx A-Fib - CAD: hold spironolactone iso worsening renal function, continue carvedilol        Daily Checklist:  Diet: Regular Diet  DVT PPx:  heparin SQ  Electrolytes:  K > 3.6 Mg >2 goal  caution with repletion given ESRD  Code Status: Full Code  Dispo: Home pending tunneled line placement with VIR and HD chair     Team Contact Information:   Primary Team: Internal Medicine (MEDU)  Primary Resident: Marja Kays, MD  Resident's Pager: [phone] (Gen MedU Intern - Alvester Morin)    Interval History:   Patient Dors subjective shortness of breath overnight, requested supplemental oxygen.  Continues to sat at 100% on 2 L.  This morning patient sitting comfortably in bed without pain or shortness of breath.    All other systems were reviewed and are negative except as noted in the HPI    Objective:   Temp:  [36.9 ??C (98.4 ??F)-37.6 ??C (99.7 ??F)] 36.9 ??C (98.4 ??F)  Heart Rate:  [65-69] 65  Resp:  [18] 18  BP: (124-144)/(69-73) 144/73  SpO2:  [93 %-96 %] 95 %    Gen:  NAD, converses normally.  HENT:  Atraumatic, normocephalic.  Heart:  RRR, no murmur/thrills/gallops.   Lungs:  Clear bilaterally without crackles or wheezes. Normal work of breathing on 2L  Extremities:  No erythema or warmth.  2+ pitting pedal edema (chronic)  Neuro:  A&Ox3. Grossly symmetric, non-focal. Intact motor/sensation of legs.        [name], MS3    I attest that I have reviewed the medical student note and that the components of the history of the present illness, the physical exam, and the assessment and plan documented were performed by me or were performed in my presence by the student where I verified the documentation and performed (or re-performed) the exam and medical decision making.     Jiles Prows, MD  Internal Medicine PGY-1

## 2022-11-26 NOTE — Unmapped (Signed)
Pt alert and oriented x 4.Up ad lib in room.Falls precautions maintained.PT/OT following.Continuous pulse ox monitoring discontinued this shift.Per MD,Pt has sleep apnea and will be started on a CPAP machine tonight.Updated on plan of care by MD.No further needs voiced.VSS.Call bell within reach.plan of care continues.  Problem: Adult Inpatient Plan of Care  Goal: Plan of Care Review  Outcome: Progressing  Flowsheets (Taken 11/26/2022 1220)  Progress: improving  Plan of Care Reviewed With: patient  Goal: Patient-Specific Goal (Individualized)  Outcome: Progressing  Flowsheets (Taken 11/26/2022 1220)  Patient/Family-Specific Goals (Include Timeframe): pt will remain free from falls and injjuries during this shift 7a-7p  Individualized Care Needs: achs,pain control,falls  Anxieties, Fears or Concerns: none voiced  Goal: Absence of Hospital-Acquired Illness or Injury  Outcome: Progressing  Intervention: Identify and Manage Fall Risk  Flowsheets  Taken 11/26/2022 1220  Safety Interventions: fall reduction program maintained  Taken 11/26/2022 1200  Safety Interventions: fall reduction program maintained  Taken 11/26/2022 1000  Safety Interventions: fall reduction program maintained  Taken 11/26/2022 0730  Safety Interventions: fall reduction program maintained  Intervention: Prevent and Manage VTE (Venous Thromboembolism) Risk  Flowsheets  Taken 11/26/2022 1220  VTE Prevention/Management:   ambulation promoted   anticoagulant therapy  Taken 11/26/2022 1200  Anti-Embolism Intervention: (HEPARIN SUB Q) Other (Comment)  Taken 11/26/2022 1000  Anti-Embolism Intervention: (HEPARIN SUB Q) Other (Comment)  Taken 11/26/2022 0730  Anti-Embolism Intervention: (HEPARIN SUB Q) Other (Comment)  Intervention: Prevent Infection  Flowsheets (Taken 11/26/2022 1220)  Infection Prevention: rest/sleep promoted  Goal: Optimal Comfort and Wellbeing  Outcome: Progressing  Intervention: Monitor Pain and Promote Comfort  Recent Flowsheet Documentation  Taken 11/26/2022 1220 by Ileene Rubens, RN  Pain Management Interventions:   pain management plan reviewed with patient/caregiver   quiet environment facilitated  Goal: Readiness for Transition of Care  Outcome: Progressing  Goal: Rounds/Family Conference  Outcome: Progressing  Flowsheets (Taken 11/26/2022 1220)  Participants:   patient   physician   nursing

## 2022-11-26 NOTE — Unmapped (Signed)
VSS. No acute changes through the night. Patient complaint with wearing continuous pulse ox. Place on 2L [place] with c/o SOB, stating 97% and above. No pain reported. All scheduled meds tolerated well. Blood sugar checked and managed per mar. Safety measures maintained. Pt bed locked in lowest position at all time. Call bell within reach.     Problem: Adult Inpatient Plan of Care  Goal: Absence of Hospital-Acquired Illness or Injury  Intervention: Identify and Manage Fall Risk  Recent Flowsheet Documentation  Taken 11/26/2022 0258 by Arlyce Dice, RN  Safety Interventions:   nonskid shoes/slippers when out of bed   bed alarm   low bed  Taken 11/25/2022 2000 by Arlyce Dice, RN  Safety Interventions:   fall reduction program maintained   low bed   lighting adjusted for tasks/safety   nonskid shoes/slippers when out of bed  Intervention: Prevent and Manage VTE (Venous Thromboembolism) Risk  Recent Flowsheet Documentation  Taken 11/25/2022 2000 by Arlyce Dice, RN  Anti-Embolism Intervention: (Heparin SQ) Other (Comment)  Intervention: Prevent Infection  Recent Flowsheet Documentation  Taken 11/25/2022 2000 by Arlyce Dice, RN  Infection Prevention:   rest/sleep promoted   single patient room provided     Problem: Fall Injury Risk  Goal: Absence of Fall and Fall-Related Injury  Intervention: Identify and Manage Contributors  Recent Flowsheet Documentation  Taken 11/26/2022 0258 by Arlyce Dice, RN  Medication Review/Management: medications reviewed  Intervention: Promote Injury-Free Environment  Flowsheets  Taken 11/26/2022 0258  Safety Interventions:   nonskid shoes/slippers when out of bed   bed alarm   low bed  Taken 11/25/2022 2000  Safety Interventions:   fall reduction program maintained   low bed   lighting adjusted for tasks/safety   nonskid shoes/slippers when out of bed     Problem: Pain Acute  Goal: Optimal Pain Control and Function  Intervention: Prevent or Manage Pain  Flowsheets (Taken 11/26/2022 0258)  Medication Review/Management: medications reviewed     Problem: Hemodialysis  Goal: Safe, Effective Therapy Delivery  Intervention: Optimize Device Care and Function  Recent Flowsheet Documentation  Taken 11/26/2022 0258 by Arlyce Dice, RN  Medication Review/Management: medications reviewed  Goal: Absence of Infection Signs and Symptoms  Intervention: Prevent or Manage Infection  Recent Flowsheet Documentation  Taken 11/25/2022 2000 by Arlyce Dice, RN  Infection Management: aseptic technique maintained  Infection Prevention:   rest/sleep promoted   single patient room provided

## 2022-11-27 LAB — BASIC METABOLIC PANEL
ANION GAP: 8 mmol/L (ref 5–14)
BLOOD UREA NITROGEN: 46 mg/dL — ABNORMAL HIGH (ref 9–23)
BUN / CREAT RATIO: 7
CALCIUM: 8.5 mg/dL — ABNORMAL LOW (ref 8.7–10.4)
CHLORIDE: 102 mmol/L (ref 98–107)
CO2: 28 mmol/L (ref 20.0–31.0)
CREATININE: 6.33 mg/dL — ABNORMAL HIGH
EGFR CKD-EPI (2021) MALE: 10 mL/min/{1.73_m2} — ABNORMAL LOW (ref >=60)
GLUCOSE RANDOM: 118 mg/dL (ref 70–179)
POTASSIUM: 4.3 mmol/L (ref 3.4–4.8)
SODIUM: 138 mmol/L (ref 135–145)

## 2022-11-27 LAB — CBC
HEMATOCRIT: 27.5 % — ABNORMAL LOW (ref 39.0–48.0)
HEMOGLOBIN: 9.3 g/dL — ABNORMAL LOW (ref 12.9–16.5)
MEAN CORPUSCULAR HEMOGLOBIN CONC: 33.6 g/dL (ref 32.0–36.0)
MEAN CORPUSCULAR HEMOGLOBIN: 28.1 pg (ref 25.9–32.4)
MEAN CORPUSCULAR VOLUME: 83.6 fL (ref 77.6–95.7)
MEAN PLATELET VOLUME: 7.7 fL (ref 6.8–10.7)
PLATELET COUNT: 169 10*9/L (ref 150–450)
RED BLOOD CELL COUNT: 3.29 10*12/L — ABNORMAL LOW (ref 4.26–5.60)
RED CELL DISTRIBUTION WIDTH: 13.9 % (ref 12.2–15.2)
WBC ADJUSTED: 6.9 10*9/L (ref 3.6–11.2)

## 2022-11-27 LAB — PHOSPHORUS: PHOSPHORUS: 7.4 mg/dL — ABNORMAL HIGH (ref 2.4–5.1)

## 2022-11-27 LAB — HEPATIC FUNCTION PANEL
ALBUMIN: 2.3 g/dL — ABNORMAL LOW (ref 3.4–5.0)
ALKALINE PHOSPHATASE: 178 U/L — ABNORMAL HIGH (ref 46–116)
ALT (SGPT): 39 U/L (ref 10–49)
AST (SGOT): 44 U/L — ABNORMAL HIGH (ref <=34)
BILIRUBIN DIRECT: 0.4 mg/dL — ABNORMAL HIGH (ref 0.00–0.30)
BILIRUBIN TOTAL: 0.8 mg/dL (ref 0.3–1.2)
PROTEIN TOTAL: 6.3 g/dL (ref 5.7–8.2)

## 2022-11-27 LAB — TACROLIMUS LEVEL, TIMED: TACROLIMUS BLOOD: 4.4 ng/mL

## 2022-11-27 LAB — MAGNESIUM: MAGNESIUM: 2.1 mg/dL (ref 1.6–2.6)

## 2022-11-27 MED ADMIN — sevelamer (RENVELA) tablet 1,600 mg: 1600 mg | ORAL | @ 17:00:00

## 2022-11-27 MED ADMIN — HYDROmorphone (DILAUDID) tablet 2 mg: 2 mg | ORAL | @ 17:00:00 | Stop: 2022-11-27

## 2022-11-27 MED ADMIN — mycophenolate (CELLCEPT) capsule 250 mg: 250 mg | ORAL | @ 12:00:00

## 2022-11-27 MED ADMIN — mycophenolate (CELLCEPT) capsule 250 mg: 250 mg | ORAL | @ 01:00:00

## 2022-11-27 MED ADMIN — insulin glargine (LANTUS) injection 15 Units: 15 [IU] | SUBCUTANEOUS | @ 03:00:00

## 2022-11-27 MED ADMIN — amlodipine (NORVASC) tablet 10 mg: 10 mg | ORAL | @ 12:00:00

## 2022-11-27 MED ADMIN — carvedilol (COREG) tablet 6.25 mg: 6.25 mg | ORAL | @ 03:00:00

## 2022-11-27 MED ADMIN — sevelamer (RENVELA) tablet 1,600 mg: 1600 mg | ORAL | @ 20:00:00

## 2022-11-27 MED ADMIN — insulin lispro (HumaLOG) injection 0-20 Units: 0-20 [IU] | SUBCUTANEOUS | @ 03:00:00

## 2022-11-27 MED ADMIN — heparin (porcine) 5,000 unit/mL injection 7,500 Units: 7500 [IU] | SUBCUTANEOUS | @ 10:00:00

## 2022-11-27 MED ADMIN — HYDROmorphone (DILAUDID) tablet 2 mg: 2 mg | ORAL | @ 03:00:00 | Stop: 2022-11-27

## 2022-11-27 MED ADMIN — carvedilol (COREG) tablet 6.25 mg: 6.25 mg | ORAL | @ 12:00:00

## 2022-11-27 MED ADMIN — tacrolimus (PROGRAF) capsule 1 mg: 1 mg | ORAL | @ 01:00:00

## 2022-11-27 MED ADMIN — insulin lispro (HumaLOG) injection 35 Units: 35 [IU] | SUBCUTANEOUS | @ 17:00:00

## 2022-11-27 MED ADMIN — insulin lispro (HumaLOG) injection 35 Units: 35 [IU] | SUBCUTANEOUS | @ 20:00:00

## 2022-11-27 MED ADMIN — tacrolimus (PROGRAF) capsule 1 mg: 1 mg | ORAL | @ 12:00:00

## 2022-11-27 MED ADMIN — insulin lispro (HumaLOG) injection 0-20 Units: 0-20 [IU] | SUBCUTANEOUS | @ 17:00:00

## 2022-11-27 MED ADMIN — pantoprazole (Protonix) EC tablet 40 mg: 40 mg | ORAL | @ 12:00:00

## 2022-11-27 MED ADMIN — HYDROmorphone (DILAUDID) tablet 2 mg: 2 mg | ORAL | @ 10:00:00 | Stop: 2022-11-27

## 2022-11-27 MED ADMIN — heparin (porcine) 5,000 unit/mL injection 7,500 Units: 7500 [IU] | SUBCUTANEOUS | @ 03:00:00

## 2022-11-27 MED ADMIN — heparin (porcine) 5,000 unit/mL injection 7,500 Units: 7500 [IU] | SUBCUTANEOUS | @ 20:00:00

## 2022-11-27 MED ADMIN — insulin lispro (HumaLOG) injection 0-20 Units: 0-20 [IU] | SUBCUTANEOUS | @ 20:00:00

## 2022-11-27 NOTE — Unmapped (Signed)
A/O x 4, afebrile, VSS on RA, PRN meds given for pain (see MAR). IJ trialysis clean, dry, intact, and flushed per protocol. VTE: Heparin SQ. Falls precautions maintained. Call bell and bedside table within reach, bed in lowest position and locked. No falls or injuries noted this shift. Will continue with plan of care.    Problem: Adult Inpatient Plan of Care  Goal: Plan of Care Review  Outcome: Progressing  Goal: Patient-Specific Goal (Individualized)  Outcome: Progressing  Flowsheets (Taken 11/27/2022 1012)  Patient/Family-Specific Goals (Include Timeframe): Free from falls or injuries through discharge  Individualized Care Needs: Falls, labs, VS, pain  Anxieties, Fears or Concerns: pain  Goal: Absence of Hospital-Acquired Illness or Injury  Outcome: Progressing  Intervention: Prevent and Manage VTE (Venous Thromboembolism) Risk  Recent Flowsheet Documentation  Taken 11/27/2022 1000 by Doree Barthel, RN  Anti-Embolism Intervention: (Heparin SQ) Other (Comment)  Taken 11/27/2022 0800 by Doree Barthel, RN  Anti-Embolism Intervention: (Heparin SQ) Other (Comment)  Taken 11/27/2022 0702 by Doree Barthel, RN  Anti-Embolism Intervention: (Heparin SQ) Other (Comment)  Goal: Optimal Comfort and Wellbeing  Outcome: Progressing  Goal: Readiness for Transition of Care  Outcome: Progressing  Goal: Rounds/Family Conference  Outcome: Progressing     Problem: Fall Injury Risk  Goal: Absence of Fall and Fall-Related Injury  Outcome: Progressing     Problem: Pain Acute  Goal: Optimal Pain Control and Function  Outcome: Progressing     Problem: Hemodialysis  Goal: Safe, Effective Therapy Delivery  Outcome: Progressing  Goal: Effective Tissue Perfusion  Outcome: Progressing  Goal: Absence of Infection Signs and Symptoms  Outcome: Progressing

## 2022-11-27 NOTE — Unmapped (Signed)
Internal Medicine (MEDU) Progress Note    Assessment & Plan:   Richard [name] is a 57 y.o. male with PMH NASH cirrhosis s/p OLT 2013, HFpEF, CKD5, persistent A-fib not on AC, CAD, HTN, OSA not on CPAP who presented to [facility] 5/22 with RLE cellulitis, currently in a prolonged admission due to AKI on CKD, elevated LFTs, and O2 requirement iso volume overload, now with progression to ESRD and tolerating HD via RIJ trialysis.    Principal Problem:    ESRD (end stage renal disease) on dialysis (CMS-HCC)  Active Problems:    Hypertension    Liver replaced by transplant (CMS-HCC)    Immunosuppression due to drug therapy (CMS-HCC)    (HFpEF) heart failure with preserved ejection fraction (CMS-HCC)    Severe hyperglycemia due to diabetes mellitus (CMS-HCC)    Cellulitis of right lower extremity    Acute kidney injury superimposed on chronic kidney disease (CMS-HCC)    CAD (coronary artery disease)    Elevated liver enzymes    Hyperphosphatemia  Resolved Problems:    Hypomagnesemia    Hypokalemia    Active Problems    ESRD - Hyperphosphatemia, improving   Has been tolerating recent sessions of dialysis, will obtain more permanent access with tunneled line.  Volume status now improved.  BUN  and phosphorous levels with significant improvements.  Plan to continue sevelamer.  Discussed with VIR plan for tunneled line 6/4 or 6/5. CM working on dialysis chair placement.    - Nephro following, appreciate recs  - Continue HD per Nephro   - Tunneled Line 6/4 or 6/5, NPO at midnight   - Decrease PO sevelamer 1600 mg TID  - Hold Spironolactone, Lasix  - Avoid NSAIDs and Morphine   - Daily BMP, Mg, Phos    T2DM   BG remain wnl. Continue regimen below.   - Lantus 15u nightly  - Humalog 35u at mealtimes (TID) (home 55u)  - SSI  - POCT BG QID    Elevated LFTs, slowly improving - s/p Liver Transplant in 2013  LFTs elevated on admission, slowly improving the setting of dialysis.  Most likely due to congestive hepatopathy.  Difficulty throughout hospitalization obtaining MRCP given need for general anesthesia, per hepatology okay to defer to outpatient, MRCP canceled.  - Continue home Cellcept, Tacrolimus (goal 2-4, dosing per pharmacy)  - outpatient MRCP  - Ok to dose Tylenol <4g per day  - Daily HFP    Intermittent Oxygen Requirement - OSA, not on CPAP - Pulmonary Edema  Patient previously with pulmonary edema in the setting of volume overload from kidney disease and heart failure, significant volume removed via multiple sessions of dialysis and patient weaned to room air.  Will intermittently be placed on oxygen overnight for subjective shortness of breath.  Patient likely has OSA, discussed CPAP, patient amenable to trying.  If oxygen requirement persist despite CPAP use, consider repeat chest x-ray and RPP to rule out viral infection.  - CPAP  - Outpatient sleep study (order was put in on 11/16/2022)  - Consider RPP     RLE Cellulitis, resolved - R Leg Pain  Patient with multiple episodes of cellulitis presented with right lower extremity cellulitis. Cellulitis has resolved with abx course as below.  Patient with pain in right leg, with right leg mildly larger than left.  PVLs ordered to rule out clot.  - Bcx 5/22: NGTD  - Bcx 5/24: NGTD  - Antibiotics:    - s/p Clindamycin (5/22)  -  s/p Vancomycin (5/23)   - s/p Doxycycline (5/24 - 5/25)   - s/p Ceftriaxone (5/25)   - s/p Cefepime (5/25 - 5/28)   - s/p Linezolid (5/25 - 5/31)   - s/p Ciprofloxacin (5/28 - 5/31)  - Daily CBC  - PO dilaudid PRN  - PVLs LE bilateral     Anemia of chronic disease  No apparent blood loss, no bright red blood per rectum or melena or hematuria. Has been hemodynamically stable.  -Epo to be set up with HD as outpatient  -Daily CBC  -Monitor for bleeding      Chronic Conditions  Hypertension: Continue amlodipine 10 mg, Coreg 6.25 mg twice daily.   HFpEF - Hx A-Fib - CAD: hold spironolactone iso worsening renal function, continue carvedilol      Daily Checklist:  Diet: Regular Diet  DVT PPx:  heparin SQ  Electrolytes:  K > 3.6 Mg >2 goal  caution with repletion given ESRD  Code Status: Full Code  Dispo: Home pending tunneled line placement with VIR and HD chair     Team Contact Information:   Primary Team: Internal Medicine (MEDU)  Primary Resident: Marja Kays, MD  Resident's Pager: [phone] (Gen MedU Intern - Alvester Morin)    Interval History:   No adverse events overnight.  Endorsed pain in right leg.  Feels like he has trouble breathing at night.  Discussed CPAP use, patient amenable.  All other systems were reviewed and are negative except as noted in the HPI    Objective:   Temp:  [36.9 ??C (98.4 ??F)-37.1 ??C (98.8 ??F)] 36.9 ??C (98.4 ??F)  Heart Rate:  [63-69] 63  Resp:  [20] 20  BP: (124-153)/(68-86) 124/68  SpO2:  [96 %-98 %] 96 %    Gen:  NAD, converses normally. Laying in bed  HENT:  Atraumatic, normocephalic.  Heart:  RRR, no murmur/thrills/gallops.   Lungs:  Clear bilaterally without crackles or wheezes. Normal work of breathing on 4L  Extremities:  No erythema or warmth.  Pitting pedal edema right leg mildly larger than left.  Neuro:  A&Ox3. Grossly symmetric, non-focal. Intact motor/sensation of legs.

## 2022-11-27 NOTE — Unmapped (Signed)
Alert and oriented x4. Breathing normal easy and regular on 4 liters of supplemental oxygen.  Medicated with pain reliever with fair effect. Independent in the room. Call bell and phone kept in reach. Bed kept at lowest position and brakes kept locked. Slept fairly well. Will monitor.    Problem: Adult Inpatient Plan of Care  Goal: Plan of Care Review  Outcome: Progressing  Flowsheets (Taken 11/27/2022 0501)  Progress: improving  Plan of Care Reviewed With: patient  Goal: Patient-Specific Goal (Individualized)  Outcome: Progressing  Flowsheets (Taken 11/27/2022 0501)  Individualized Care Needs: No falls thru end of shift.  Anxieties, Fears or Concerns: Pain will be tolerable thru end of shift.  Goal: Absence of Hospital-Acquired Illness or Injury  Outcome: Progressing  Intervention: Identify and Manage Fall Risk  Recent Flowsheet Documentation  Taken 11/27/2022 0000 by Lennox Grumbles, RN  Safety Interventions:   fall reduction program maintained   environmental modification  Intervention: Prevent Skin Injury  Recent Flowsheet Documentation  Taken 11/27/2022 0000 by Lennox Grumbles, RN  Positioning for Skin: Supine/Back  Taken 11/26/2022 2300 by Lennox Grumbles, RN  Positioning for Skin: Supine/Back  Taken 11/26/2022 2200 by Lennox Grumbles, RN  Positioning for Skin: Supine/Back  Taken 11/26/2022 2000 by Lennox Grumbles, RN  Positioning for Skin: Supine/Back  Intervention: Prevent and Manage VTE (Venous Thromboembolism) Risk  Recent Flowsheet Documentation  Taken 11/27/2022 0000 by Lennox Grumbles, RN  Anti-Embolism Intervention: (heparin) Other (Comment)  Taken 11/26/2022 2200 by Lennox Grumbles, RN  Anti-Embolism Intervention: (heparin) Other (Comment)  Taken 11/26/2022 2000 by Lennox Grumbles, RN  Anti-Embolism Intervention: (heparin) Other (Comment)  Intervention: Prevent Infection  Recent Flowsheet Documentation  Taken 11/27/2022 0000 by Lennox Grumbles, RN  Infection Prevention: rest/sleep promoted  Goal: Optimal Comfort and Wellbeing  Outcome: Progressing  Goal: Readiness for Transition of Care  Outcome: Progressing  Goal: Rounds/Family Conference  Outcome: Progressing     Problem: Fall Injury Risk  Goal: Absence of Fall and Fall-Related Injury  Outcome: Progressing  Intervention: Promote Injury-Free Environment  Recent Flowsheet Documentation  Taken 11/27/2022 0000 by Lennox Grumbles, RN  Safety Interventions:   fall reduction program maintained   environmental modification     Problem: Pain Acute  Goal: Optimal Pain Control and Function  Outcome: Progressing     Problem: Hemodialysis  Goal: Safe, Effective Therapy Delivery  Outcome: Progressing  Goal: Effective Tissue Perfusion  Outcome: Progressing  Goal: Absence of Infection Signs and Symptoms  Outcome: Progressing  Intervention: Prevent or Manage Infection  Recent Flowsheet Documentation  Taken 11/27/2022 0000 by Lennox Grumbles, RN  Infection Management: aseptic technique maintained  Infection Prevention: rest/sleep promoted

## 2022-11-27 NOTE — Unmapped (Signed)
Tacrolimus Therapeutic Monitoring Pharmacy Note    Richard [name] is a 57 y.o. male continuing tacrolimus.     Indication: Liver transplant     Date of Transplant:  2013       Prior Dosing Information: Current regimen 1 mg BID      Source(s) of information used to determine prior to admission dosing: Home Medication List or Clinic Note    Goals:  Therapeutic Drug Levels  Tacrolimus trough goal:  2-4 ng/mL  (per 09/16/21 Clinic Note, most recent note Identified.    Additional Clinical Monitoring/Outcomes  Monitor renal function (SCr and urine output) and liver function (LFTs)  Monitor for signs/symptoms of adverse events (e.g., hyperglycemia, hyperkalemia, hypomagnesemia, hypertension, headache, tremor)    Results:     Pharmacokinetic Considerations and Significant Drug Interactions:  Concurrent hepatotoxic medications: Doxycycline (5/24.Marland Kitchen)  Concurrent CYP3A4 substrates/inhibitors: Amlodipine (homemed)  Concurrent nephrotoxic medications:   Furosemide (IV boluses) + Metolazone    Tacrolimus Dosing  Tacro 1mg  BID (5/23.Marland KitchenMarland KitchenMarland Kitchen)      Tacrolimus level  5/24 = 2.2 ng/mL at 0445, drawn ~6.75h after last dose  5/27 = 3.1 ng/mL at 0923  5/28 = 3.7 ng/mL at 0731  5/30 =  4.4 ng/mL at 0900 -- HD(to start??)  06/01 = 4.0 ng/mL at 0800  06/03 = 4.4 ng/mL at 0611      Assessment/Plan:  5/30 possibly starting HD in next day or so.  6/3 level - in goal range (2-4)     Recommendedation(s)  Continue current regimen of 1 mg BID    Follow-up  Tacrolimus Levels:   Every other Day with AM labs drawn between 0600 - 0800 PRIOR to morning dose.  Dose due 0800  A pharmacist will continue to monitor and recommend levels as appropriate    Please page service pharmacist with questions/clarifications.    Drue Flirt, [facility]  November 27, 2022 4:07 PM

## 2022-11-28 LAB — HEPATIC FUNCTION PANEL
ALBUMIN: 2.3 g/dL — ABNORMAL LOW (ref 3.4–5.0)
ALKALINE PHOSPHATASE: 172 U/L — ABNORMAL HIGH (ref 46–116)
ALT (SGPT): 40 U/L (ref 10–49)
AST (SGOT): 41 U/L — ABNORMAL HIGH (ref <=34)
BILIRUBIN DIRECT: 0.2 mg/dL (ref 0.00–0.30)
BILIRUBIN TOTAL: 0.4 mg/dL (ref 0.3–1.2)
PROTEIN TOTAL: 6.1 g/dL (ref 5.7–8.2)

## 2022-11-28 LAB — QUANTIFERON TB GOLD PLUS
QUANTIFERON ANTIGEN 1 MINUS NIL: 0 [IU]/mL
QUANTIFERON ANTIGEN 2 MINUS NIL: -0.01 [IU]/mL
QUANTIFERON MITOGEN: 0.11 [IU]/mL
QUANTIFERON TB GOLD PLUS: UNDETERMINED — AB
QUANTIFERON TB NIL VALUE: 0.03 [IU]/mL

## 2022-11-28 LAB — BASIC METABOLIC PANEL
ANION GAP: 10 mmol/L (ref 5–14)
BLOOD UREA NITROGEN: 59 mg/dL — ABNORMAL HIGH (ref 9–23)
BUN / CREAT RATIO: 8
CALCIUM: 8.4 mg/dL — ABNORMAL LOW (ref 8.7–10.4)
CHLORIDE: 102 mmol/L (ref 98–107)
CO2: 26 mmol/L (ref 20.0–31.0)
CREATININE: 7.47 mg/dL — ABNORMAL HIGH
EGFR CKD-EPI (2021) MALE: 8 mL/min/{1.73_m2} — ABNORMAL LOW (ref >=60)
GLUCOSE RANDOM: 171 mg/dL (ref 70–179)
POTASSIUM: 4 mmol/L (ref 3.4–4.8)
SODIUM: 138 mmol/L (ref 135–145)

## 2022-11-28 LAB — TB NIL: TB NIL VALUE: 0.03

## 2022-11-28 LAB — TB AG2: TB AG2 VALUE: 0.02

## 2022-11-28 LAB — TB MITOGEN: TB MITOGEN VALUE: 0.14

## 2022-11-28 LAB — CBC
HEMATOCRIT: 26 % — ABNORMAL LOW (ref 39.0–48.0)
HEMOGLOBIN: 8.8 g/dL — ABNORMAL LOW (ref 12.9–16.5)
MEAN CORPUSCULAR HEMOGLOBIN CONC: 33.9 g/dL (ref 32.0–36.0)
MEAN CORPUSCULAR HEMOGLOBIN: 28.4 pg (ref 25.9–32.4)
MEAN CORPUSCULAR VOLUME: 83.8 fL (ref 77.6–95.7)
MEAN PLATELET VOLUME: 7.9 fL (ref 6.8–10.7)
PLATELET COUNT: 144 10*9/L — ABNORMAL LOW (ref 150–450)
RED BLOOD CELL COUNT: 3.1 10*12/L — ABNORMAL LOW (ref 4.26–5.60)
RED CELL DISTRIBUTION WIDTH: 13.9 % (ref 12.2–15.2)
WBC ADJUSTED: 6.9 10*9/L (ref 3.6–11.2)

## 2022-11-28 LAB — PHOSPHORUS: PHOSPHORUS: 7.4 mg/dL — ABNORMAL HIGH (ref 2.4–5.1)

## 2022-11-28 LAB — MAGNESIUM: MAGNESIUM: 2.1 mg/dL (ref 1.6–2.6)

## 2022-11-28 LAB — TB AG1: TB AG1 VALUE: 0.03

## 2022-11-28 MED ORDER — INSULIN ASPART (U-100) 100 UNIT/ML (3 ML) SUBCUTANEOUS PEN
Freq: Three times a day (TID) | SUBCUTANEOUS | 0 refills | 57.00000 days
Start: 2022-11-28 — End: ?

## 2022-11-28 MED ORDER — DICLOFENAC 1 % TOPICAL GEL
Freq: Four times a day (QID) | TOPICAL | 0 refills | 13.00000 days
Start: 2022-11-28 — End: 2023-11-28

## 2022-11-28 MED ORDER — AMLODIPINE 10 MG TABLET
ORAL_TABLET | Freq: Every day | ORAL | 0 refills | 30.00000 days
Start: 2022-11-28 — End: ?

## 2022-11-28 MED ORDER — SPIRONOLACTONE 25 MG TABLET
ORAL_TABLET | Freq: Every day | ORAL | 0 refills | 30.00000 days | Status: CN
Start: 2022-11-28 — End: ?

## 2022-11-28 MED ORDER — LIDOCAINE 4 % TOPICAL PATCH
MEDICATED_PATCH | Freq: Every day | TRANSDERMAL | 0 refills | 30.00000 days
Start: 2022-11-28 — End: ?

## 2022-11-28 MED ORDER — INSULIN DETEMIR (U-100) 100 UNIT/ML (3 ML) SUBCUTANEOUS PEN
Freq: Every evening | SUBCUTANEOUS | 0 refills | 100.00000 days
Start: 2022-11-28 — End: 2022-12-28

## 2022-11-28 MED ORDER — POLYETHYLENE GLYCOL 3350 17 GRAM ORAL POWDER PACKET
PACK | Freq: Every day | ORAL | 0 refills | 30.00000 days
Start: 2022-11-28 — End: ?

## 2022-11-28 MED ORDER — SEVELAMER CARBONATE 800 MG TABLET
ORAL_TABLET | Freq: Three times a day (TID) | ORAL | 0 refills | 30.00000 days
Start: 2022-11-28 — End: ?

## 2022-11-28 MED ADMIN — sevelamer (RENVELA) tablet 1,600 mg: 1600 mg | ORAL | @ 22:00:00

## 2022-11-28 MED ADMIN — carvedilol (COREG) tablet 6.25 mg: 6.25 mg | ORAL | @ 04:00:00

## 2022-11-28 MED ADMIN — heparin (porcine) 5,000 unit/mL injection 7,500 Units: 7500 [IU] | SUBCUTANEOUS | @ 18:00:00

## 2022-11-28 MED ADMIN — insulin lispro (HumaLOG) injection 0-20 Units: 0-20 [IU] | SUBCUTANEOUS | @ 21:00:00

## 2022-11-28 MED ADMIN — insulin lispro (HumaLOG) injection 35 Units: 35 [IU] | SUBCUTANEOUS | @ 22:00:00

## 2022-11-28 MED ADMIN — sevelamer (RENVELA) tablet 1,600 mg: 1600 mg | ORAL | @ 18:00:00

## 2022-11-28 MED ADMIN — heparin (porcine) 5,000 unit/mL injection 7,500 Units: 7500 [IU] | SUBCUTANEOUS | @ 04:00:00

## 2022-11-28 MED ADMIN — amlodipine (NORVASC) tablet 10 mg: 10 mg | ORAL | @ 18:00:00

## 2022-11-28 MED ADMIN — gentamicin-sodium citrate lock solution in NS: 1.4 mL | @ 16:00:00

## 2022-11-28 MED ADMIN — HYDROmorphone (DILAUDID) tablet 2 mg: 2 mg | ORAL | @ 01:00:00 | Stop: 2022-11-28

## 2022-11-28 MED ADMIN — tacrolimus (PROGRAF) capsule 1 mg: 1 mg | ORAL | @ 18:00:00

## 2022-11-28 MED ADMIN — mycophenolate (CELLCEPT) capsule 250 mg: 250 mg | ORAL | @ 01:00:00

## 2022-11-28 MED ADMIN — HYDROmorphone (DILAUDID) tablet 2 mg: 2 mg | ORAL | @ 08:00:00 | Stop: 2022-11-28

## 2022-11-28 MED ADMIN — insulin glargine (LANTUS) injection 15 Units: 15 [IU] | SUBCUTANEOUS | @ 04:00:00

## 2022-11-28 MED ADMIN — insulin lispro (HumaLOG) injection 35 Units: 35 [IU] | SUBCUTANEOUS | @ 18:00:00

## 2022-11-28 MED ADMIN — pantoprazole (Protonix) EC tablet 40 mg: 40 mg | ORAL | @ 18:00:00

## 2022-11-28 MED ADMIN — tacrolimus (PROGRAF) capsule 1 mg: 1 mg | ORAL | @ 01:00:00

## 2022-11-28 MED ADMIN — HYDROmorphone (DILAUDID) tablet 2 mg: 2 mg | ORAL | @ 18:00:00 | Stop: 2022-11-30

## 2022-11-28 MED ADMIN — mycophenolate (CELLCEPT) capsule 250 mg: 250 mg | ORAL | @ 18:00:00

## 2022-11-28 MED ADMIN — heparin (porcine) 5,000 unit/mL injection 7,500 Units: 7500 [IU] | SUBCUTANEOUS | @ 11:00:00

## 2022-11-28 MED ADMIN — carvedilol (COREG) tablet 6.25 mg: 6.25 mg | ORAL | @ 18:00:00

## 2022-11-28 MED ADMIN — insulin lispro (HumaLOG) injection 0-20 Units: 0-20 [IU] | SUBCUTANEOUS | @ 04:00:00

## 2022-11-28 NOTE — Unmapped (Addendum)
Pt remains alert/oriented. On room air. Self care. HD done today. Had 1 BM. Insulin coverage provided. Fair appetite.   RLE pain tolerable with dilaudid.   Problem: Adult Inpatient Plan of Care  Goal: Plan of Care Review  Outcome: Progressing  Flowsheets (Taken 11/28/2022 1651)  Progress: improving  Plan of Care Reviewed With: patient  Goal: Patient-Specific Goal (Individualized)  Outcome: Progressing  Goal: Absence of Hospital-Acquired Illness or Injury  Outcome: Progressing  Intervention: Identify and Manage Fall Risk  Recent Flowsheet Documentation  Taken 11/28/2022 1600 by Angelena Form, RN  Safety Interventions:   environmental modification   fall reduction program maintained   low bed  Taken 11/28/2022 1339 by Angelena Form, RN  Safety Interventions:   environmental modification   fall reduction program maintained   low bed  Taken 11/28/2022 0730 by Angelena Form, RN  Safety Interventions:   environmental modification   fall reduction program maintained   low bed  Intervention: Prevent and Manage VTE (Venous Thromboembolism) Risk  Recent Flowsheet Documentation  Taken 11/28/2022 0730 by Angelena Form, RN  Anti-Embolism Intervention: (heparin SQ) Other (Comment)  Goal: Optimal Comfort and Wellbeing  Outcome: Progressing  Goal: Readiness for Transition of Care  Outcome: Progressing  Goal: Rounds/Family Conference  Outcome: Progressing

## 2022-11-28 NOTE — Unmapped (Signed)
[facility] Nephrology Hemodialysis Procedure Note     11/28/2022    Richard [name] was seen and examined on hemodialysis    CHIEF COMPLAINT: End Stage Renal Disease    INTERVAL HISTORY: Doing ok. Eager to get out of hospital back to my life    DIALYSIS TREATMENT DATA:  Estimated Dry Weight (kg):  (tbd) Patient Goal Weight (kg): 1.5 kg (3 lb 4.9 oz)   Pre-Treatment Weight (kg): 135.8 kg (299 lb 6.2 oz)    Dialysis Bath  Bath: 3 K+ / 2.5 Ca+  Dialysate Na (mEq/L): 137 mEq/L  Dialysate HCO3 (mEq/L): 35 mEq/L Dialyzer: F-180 (98 mLs)   Blood Flow Rate (mL/min): 400 mL/min Dialysis Flow (mL/min): 800 mL/min   Machine Temperature (C): 36 ??C (96.8 ??F)      PHYSICAL EXAM:  Vitals:  Temp:  [36.2 ??C (97.1 ??F)-36.6 ??C (97.9 ??F)] 36.2 ??C (97.1 ??F)  Heart Rate:  [60-73] 70  BP: (129-178)/(64-90) 178/90  MAP (mmHg):  [86-106] 106    General: healthy, obese, currently dialyzing in a Hemodialysis Recliner  Pulmonary: normal respiratory effort  Cardiovascular: regular rate and rhythm  Extremities: tr edema  Access: Right IJ tunneled catheter - exit looks ok    LAB DATA:  Lab Results   Component Value Date    NA 138 11/28/2022    K 4.0 11/28/2022    CL 102 11/28/2022    CO2 26.0 11/28/2022    BUN 59 (H) 11/28/2022    CREATININE 7.47 (H) 11/28/2022    GLUCOSE 196 03/03/2012    CALCIUM 8.4 (L) 11/28/2022    MG 2.1 11/28/2022    PHOS 7.4 (H) 11/28/2022    ALBUMIN 2.3 (L) 11/28/2022      Lab Results   Component Value Date    HCT 26.0 (L) 11/28/2022    WBC 6.9 11/28/2022        ASSESSMENT/PLAN:  End Stage Renal Disease on Intermittent Hemodialysis:  UF goal: 1.5L as tolerated  Adjust medications for a GFR <10  Avoid nephrotoxic agents  Last HD Treatment:Started (11/28/22)     Bone Mineral Metabolism:  Lab Results   Component Value Date    CALCIUM 8.4 (L) 11/28/2022    CALCIUM 8.5 (L) 11/27/2022    Lab Results   Component Value Date    ALBUMIN 2.3 (L) 11/28/2022    ALBUMIN 2.3 (L) 11/27/2022      Lab Results   Component Value Date    PHOS 7.4 (H) 11/28/2022    PHOS 7.4 (H) 11/27/2022    Lab Results   Component Value Date    PTH 267.5 (H) 10/14/2021      Hypoalbuminemia. Hyperphosphatemia should improve with dialysis, also on sevelamer    Anemia:   Lab Results   Component Value Date    HGB 8.8 (L) 11/28/2022    HGB 9.3 (L) 11/27/2022    HGB 9.0 (L) 11/26/2022    Iron Saturation (%)   Date Value Ref Range Status   11/09/2022 36 % Final   05/15/2012 39 20 - 50 % Final      Lab Results   Component Value Date    FERRITIN 169.8 11/09/2022       Anemia labs appropriate, no changes.    Vascular Access:  Vascular Access functioning well - no need for intervention  Blood Flow Rate (mL/min): 400 mL/min    IV Antibiotics to be administered at discharge:  No    This procedure was fully reviewed with the  patient and/or their decision-maker. The risks, benefits, and alternatives were discussed prior to the procedure. All questions were answered and written informed consent was obtained.    Antonietta Barcelona, MD  [place] [facility] of Nephrology & Hypertension

## 2022-11-28 NOTE — Unmapped (Signed)
HEMODIALYSIS NURSE PROCEDURE NOTE       Treatment Number:  4 Room / Station:  4    Procedure Date:  11/28/22 Device Name/Number: [place]    Total Dialysis Treatment Time:  245 Min.    CONSENT:    Written consent was obtained prior to the procedure and is detailed in the medical record.  Prior to the start of the procedure, a time out was taken and the identity of the patient was confirmed via name, medical record number and date of birth.     WEIGHT:  Hemodialysis Pre-Treatment Weights       Date/Time Pre-Treatment Weight (kg) Estimated Dry Weight (kg) Patient Goal Weight (kg) Total Goal Weight (kg)    11/28/22 0805 135.8 kg (299 lb 6.2 oz) -- 1.5 kg (3 lb 4.9 oz) 2.05 kg (4 lb 8.3 oz)           Hemodialysis Post Treatment Weights       Date/Time Post-Treatment Weight (kg) Treatment Weight Change (kg)    11/28/22 1234 133.2 kg (293 lb 10.4 oz) -2.61 kg          Active Dialysis Orders (168h ago, onward)       Start     Ordered    11/25/22 0919  Hemodialysis inpatient  Every Tue,Thu,Sat      Question Answer Comment   Patient HD Status: Chronic    New Start? Yes    K+ 3 meq/L    Ca++ 2.5 meq/L    Bicarb 35 meq/L    Na+ 137 meq/L    Na+ Modeling no    Dialyzer [id number]    Dialysate Temperature (C) 36    BFR-As tolerated to a maximum of: 400 mL/min see comments   DFR 800 mL/min see comments   Duration of treatment 4 Hr see comments   Dry weight (kg) TBD    Challenge dry weight (kg) yes    Fluid removal (L) 1.5L on 11/25/22    Tubing Adult = 142 ml    Access Site Dialysis Catheter    Access Site Location Right    Keep SBP >: 100        11/25/22 0918                  ASSESSMENT:  General appearance: alert  Neurologic: Grossly normal  Lungs: diminished breath sounds bilaterally  Heart: S1, S2 normal      ACCESS SITE:          Hemodialysis Catheter With Distal Infusion Port 11/23/22 Right Internal jugular 1.4 mL 1.4 mL (Active)   Site Assessment Clean;Dry;Intact 11/28/22 1200   Proximal Lumen Status / Patency Gentamicin Citrate Locked;Capped 11/28/22 1200   Proximal Lumen Intervention Deaccessed 11/28/22 1200   Medial Lumen Status / Patency Gentamicin Citrate Locked;Capped 11/28/22 1200   Medial Lumen Intervention Deaccessed 11/28/22 1200   Lumen 3, Distal Status / Patency Capped 11/27/22 2200   Distal Lumen Intervention Flushed 11/27/22 0810   Dressing Type CHG gel;Occlusive;Transparent 11/28/22 1200   Dressing Status      Clean;Intact/not removed;Dry 11/28/22 1200   Dressing Intervention No intervention needed 11/28/22 1200   Dressing Change Due 11/30/22 11/28/22 1200   Line Necessity Reviewed? Y 11/28/22 0815   Line Necessity Indications Yes - Hemodialysis 11/28/22 0815   Line Necessity Reviewed With Nephrology 11/28/22 0815        Catheter fill volumes:    Arterial: 1.4 mL   Venous: 1.4 mL   Catheter  locked with gent/citrate solution post procedure.     Patient Lines/Drains/Airways Status       Active Peripheral & Central Intravenous Access       Name Placement date Placement time Site Days    Hemodialysis Catheter With Distal Infusion Port 11/23/22 Right Internal jugular 1.4 mL 1.4 mL 11/23/22  1324  Internal jugular  4                   LAB RESULTS:  Lab Results   Component Value Date    NA 138 11/28/2022    K 4.0 11/28/2022    CL 102 11/28/2022    CO2 26.0 11/28/2022    BUN 59 (H) 11/28/2022    CREATININE 7.47 (H) 11/28/2022    GLU 171 11/28/2022    GLUF 138 10/06/2014    CALCIUM 8.4 (L) 11/28/2022    CAION 4.57 11/20/2022    ICAV 4.88 03/04/2012    PHOS 7.4 (H) 11/28/2022    MG 2.1 11/28/2022    PTH 267.5 (H) 10/14/2021    IRON 95 11/09/2022    LABIRON 36 11/09/2022    TRANSFERRIN 212.0 (L) 11/09/2022    FERRITIN 169.8 11/09/2022    TIBC 267.1 11/09/2022     Lab Results   Component Value Date    WBC 6.9 11/28/2022    HGB 8.8 (L) 11/28/2022    HCT 26.0 (L) 11/28/2022    PLT 144 (L) 11/28/2022    PHART 7.36 03/04/2012    PO2ART 88 03/04/2012    PCO2ART 43 03/04/2012    HCO3ART 23.4 03/04/2012    BEART -1.3 03/04/2012    O2SATART 98.1 03/04/2012    PTINR : 05/06/2012    APTT 26.1 04/24/2022        VITAL SIGNS:   Temperature       Date/Time Temp Temp src       11/28/22 0802 36.2 ??C (97.1 ??F) Temporal           Hemodynamics       Date/Time Pulse BP MAP (mmHg) Arterial Line BP    11/28/22 1231 67 124/58 -- --    11/28/22 1200 72 140/67 -- --    11/28/22 1130 70 132/58 -- --    11/28/22 1100 70 178/90 -- --    11/28/22 1030 70 152/81 -- --    11/28/22 1000 65 161/64 -- --    11/28/22 0930 69 153/79 -- --    11/28/22 0900 66 129/73 -- --    11/28/22 0830 -- -- -- --    11/28/22 0826 70 160/85 -- --    11/28/22 0802 73 162/86 -- --      Date/Time Arterial Line MAP Arterial Line BP 2 Arterial Line MAP 2 Patient Position    11/28/22 1231 -- -- -- Sitting    11/28/22 1200 -- -- -- Sitting    11/28/22 1130 -- -- -- Sitting    11/28/22 1100 -- -- -- Sitting    11/28/22 1030 -- -- -- Sitting    11/28/22 1000 -- -- -- Sitting    11/28/22 0930 -- -- -- Sitting    11/28/22 0900 -- -- -- Sitting    11/28/22 0830 -- -- -- Sitting    11/28/22 0826 -- -- -- Sitting    11/28/22 0802 -- -- -- Sitting          Blood Volume Monitor       Date/Time Blood Volume Change (%) HCT HGB Critline  O2 SAT %    11/28/22 1231 -2.3 % 28.1 9.5 63.3    11/28/22 1200 -4.4 % 28.7 9.8 59.2    11/28/22 1130 -6.2 % 29.2 9.9 60.7    11/28/22 1100 -5.7 % 29.1 9.9 62.4    11/28/22 1030 -6.3 % 29.3 9.9 60.1    11/28/22 1000 -4.7 % 28.8 9.8 65.9    11/28/22 0930 -5.4 % 29 9.9 60.1    11/28/22 0900 -4.5 % 28.7 9.8 64.1    11/28/22 0826 -0.5 % 27.4 9.3 62.5          Oxygen Therapy       Date/Time Resp SpO2 O2 Device O2 Flow Rate (L/min)    11/28/22 1231 18 -- -- --    11/28/22 1200 18 -- -- --    11/28/22 1130 18 -- None (Room air) --    11/28/22 1100 18 -- None (Room air) --    11/28/22 1030 18 -- None (Room air) --    11/28/22 1000 18 -- None (Room air) --    11/28/22 0930 18 -- None (Room air) --    11/28/22 0900 20 -- None (Room air) --    11/28/22 0830 20 -- None (Room air) --    11/28/22 0826 21 -- None (Room air) --    11/28/22 0802 20 -- -- --            Pre-Hemodialysis Assessment       Date/Time Therapy Number Dialyzer Hemodialysis Line Type All Machine Alarms Passed    11/28/22 0805 4 F-180 (98 mLs) Adult (142 m/s) Yes      Date/Time Air Detector Saline Line Double Clampled Hemo-Safe Applied Dialysis Flow (mL/min)    11/28/22 0805 Engaged -- -- 800 mL/min      Date/Time Verify Priming Solution Priming Volume Hemodialysis Independent pH Hemodialysis Machine Conductivity (mS/cm)    11/28/22 0805 0.9% NS 300 mL -- 13.6 mS/cm      Date/Time Hemodialysis Independent Conductivity (mS/cm) Bicarb Conductivity Residual Bleach Negative Total Chlorine    11/28/22 0805 13.8 mS/cm -- Yes 0            Hemodialysis Treatment       Date/Time Blood Flow Rate (mL/min) Arterial Pressure (mmHg) Venous Pressure (mmHg) Transmembrane Pressure (mmHg)    11/28/22 1231 0 mL/min 0 mmHg 0 mmHg 0 mmHg    11/28/22 1200 400 mL/min -144 mmHg 138 mmHg 48 mmHg    11/28/22 1130 400 mL/min -146 mmHg 138 mmHg 49 mmHg    11/28/22 1100 400 mL/min -146 mmHg 141 mmHg 48 mmHg    11/28/22 1030 400 mL/min -143 mmHg 140 mmHg 51 mmHg    11/28/22 1000 400 mL/min -144 mmHg 133 mmHg 49 mmHg    11/28/22 0930 400 mL/min -142 mmHg 144 mmHg 49 mmHg    11/28/22 0900 400 mL/min -140 mmHg 134 mmHg 52 mmHg    11/28/22 0826 400 mL/min -130 mmHg 130 mmHg 50 mmHg      Date/Time Ultrafiltration Rate (mL/hr) Ultrafiltrate Removed (mL) Dialysate Flow Rate (mL/min) KECN (Kecn)    11/28/22 1231 0 mL/hr 2063 mL 0 ml/min --    11/28/22 1200 510 mL/hr 1803 mL 800 ml/min --    11/28/22 1130 510 mL/hr 1550 mL 800 ml/min --    11/28/22 1100 510 mL/hr 1300 mL 800 ml/min --    11/28/22 1030 510 mL/hr 1049 mL 800 ml/min --    11/28/22 1000 510 mL/hr 796 mL 800 ml/min --  11/28/22 0930 510 mL/hr 545 mL 800 ml/min --    11/28/22 0900 510 mL/hr 292 mL 800 ml/min --    11/28/22 0826 510 mL/hr 0 mL 800 ml/min --          Hemodialysis Treatment Comments       Date/Time Intra-Hemodialysis Comments    11/28/22 1231 Blood returned, tx D/C.  CVC flushed, locked, capped, and clamped.    11/28/22 1200 Patient using smart phone    11/28/22 1130 Patient resting with eyes closed    11/28/22 1100 Patient watching tv    11/28/22 1030 Pt awake and alert;on cell phone;no visible issues or complaints    11/28/22 1000 Patient watching TV    11/28/22 0930 Patient watching tv    11/28/22 0900 Patient watching tv    11/28/22 0830 Patient watching TV    11/28/22 0826 Access cannulated w/o difficulty, tx started    11/28/22 0802 Pre HD treatment VS          Post Treatment       Date/Time Rinseback Volume (mL) On Line Clearance: spKt/V Total Liters Processed (L/min) Dialyzer Clearance    11/28/22 1234 300 mL -- 91.8 L/min Moderately streaked          Post Hemodialysis Treatment Comments       Date/Time Post-Hemodialysis Comments    11/28/22 1234 Patient tolerated HD treatment well, 4 hr treatment completed per MD orders.  Net UF of 1 kg.  CVC functioned well throughout treatment.          Hemodialysis I/O       Date/Time Total Hemodialysis Replacement Volume (mL) Total Ultrafiltrate Output (mL)    11/28/22 1234 -- 1013 mL            [id number] - Medicaitons Given During Treatment  (last 8 hrs)           ** NO USER **         Medication Name Action Time Action Route Rate Dose User     [Provider Hold] spironolactone (ALDACTONE) tablet 25 mg On hold since Fri 11/17/2022 at 2323 until manually unheld; held by Achille Rich, MDHold Reason: Abnormal lab 11/28/22 0900 Provider Hold Dose Oral   ** No User **            Angelena Form, RN         Medication Name Action Time Action Route Rate Dose User     insulin lispro (HumaLOG) injection 0-20 Units 11/28/22 0747 Not Given Subcutaneous   Angelena Form, RN     insulin lispro (HumaLOG) injection 35 Units 11/28/22 0747 Not Given Subcutaneous  35 Units Angelena Form, RN            Mammie Russian, RN         Medication Name Action Time Action Route Rate Dose User     gentamicin-sodium citrate lock solution in NS 11/28/22 1227 Given Intra-cannular  1.4 mL Mammie Russian, RN     gentamicin-sodium citrate lock solution in NS 11/28/22 1226 Given Intra-cannular  1.4 mL Mammie Russian, RN            STA ANA, MARVIN H, RN         Medication Name Action Time Action Route Rate Dose User     diclofenac sodium (VOLTAREN) 1 % gel 2 g 11/28/22 0700 Refused Topical  2 g Jeraldine Loots H, RN     heparin (porcine) 5,000 unit/mL injection 7,500  Units 11/28/22 1610 Given Subcutaneous  7,500 Units Lennox Grumbles, RN                      Patient tolerated treatment in a  Dialysis Recliner.

## 2022-11-28 NOTE — Unmapped (Signed)
Internal Medicine (MEDU) Progress Note    Assessment & Plan:   Richard [name] is a 57 y.o. male with PMH NASH cirrhosis s/p OLT 2013, HFpEF, CKD5, persistent A-fib not on AC, CAD, HTN, OSA not on CPAP who presented to [facility] 5/22 with RLE cellulitis, currently in a prolonged admission due to AKI on CKD, elevated LFTs, and O2 requirement iso volume overload, now with progression to ESRD and tolerating HD via RIJ trialysis.    Principal Problem:    ESRD (end stage renal disease) on dialysis (CMS-HCC)  Active Problems:    Hypertension    Liver replaced by transplant (CMS-HCC)    Immunosuppression due to drug therapy (CMS-HCC)    (HFpEF) heart failure with preserved ejection fraction (CMS-HCC)    Severe hyperglycemia due to diabetes mellitus (CMS-HCC)    Cellulitis of right lower extremity    Acute kidney injury superimposed on chronic kidney disease (CMS-HCC)    CAD (coronary artery disease)    Elevated liver enzymes    Hyperphosphatemia  Resolved Problems:    Hypomagnesemia    Hypokalemia    Active Problems    ESRD - Hyperphosphatemia, improving   Has been tolerating recent sessions of dialysis, will obtain more permanent access with tunneled line.  Volume status now improved.  BUN  and phosphorous levels with significant improvements.  Plan to continue sevelamer.  Discussed with VIR plan for tunneled line 6/4 or 6/5. CM working on dialysis chair placement.    - Nephro following, appreciate recs  - Continue HD per Nephro   - Tunneled Line 6/5, NPO at midnight   - Decrease PO sevelamer 1600 mg TID  - Hold Spironolactone, Lasix  - Avoid NSAIDs and Morphine   - Daily BMP, Mg, Phos    T2DM   BG remain wnl. Continue regimen below.   - Lantus 15u nightly  - Humalog 35u at mealtimes (TID) (home 55u)  - SSI  - POCT BG QID    Elevated LFTs, slowly improving - s/p Liver Transplant in 2013  LFTs elevated on admission, slowly improving the setting of dialysis.  Most likely due to congestive hepatopathy.  Difficulty throughout hospitalization obtaining MRCP given need for general anesthesia, per hepatology okay to defer to outpatient, MRCP canceled.  - Continue home Cellcept, Tacrolimus (goal 2-4, dosing per pharmacy)  - outpatient MRCP  - Ok to dose Tylenol <4g per day  - Daily HFP    Intermittent Oxygen Requirement - OSA, not on CPAP - Pulmonary Edema  Patient previously with pulmonary edema in the setting of volume overload from kidney disease and heart failure, significant volume removed via multiple sessions of dialysis and patient weaned to room air.  Will intermittently be placed on oxygen overnight for subjective shortness of breath.  Patient likely has OSA, discussed CPAP, patient amenable to trying.  If oxygen requirement persist despite CPAP use, consider repeat chest x-ray and RPP to rule out viral infection.  - CPAP  - Outpatient sleep study (order was put in on 11/16/2022)  - Consider RPP     RLE Cellulitis, resolved - R Leg Pain  Patient with multiple episodes of cellulitis presented with right lower extremity cellulitis. Cellulitis has resolved with abx course as below.  Patient with pain in right leg, with right leg mildly larger than left.  PVLs ordered to rule out clot.  - Bcx 5/22: NGTD  - Bcx 5/24: NGTD  - Antibiotics:    - s/p Clindamycin (5/22)  - s/p Vancomycin (  5/23)   - s/p Doxycycline (5/24 - 5/25)   - s/p Ceftriaxone (5/25)   - s/p Cefepime (5/25 - 5/28)   - s/p Linezolid (5/25 - 5/31)   - s/p Ciprofloxacin (5/28 - 5/31)  - Daily CBC  - PO dilaudid PRN  - PVLs LE 6/3 bilateral without evidence of clot    Anemia of chronic disease  No apparent blood loss, no bright red blood per rectum or melena or hematuria. Has been hemodynamically stable.  -Epo to be set up with HD as outpatient  -Daily CBC  -Monitor for bleeding      Chronic Conditions  Hypertension: Continue amlodipine 10 mg, Coreg 6.25 mg twice daily.   HFpEF - Hx A-Fib - CAD: hold spironolactone iso worsening renal function, continue carvedilol      Daily Checklist:  Diet: Regular Diet  DVT PPx:  heparin SQ  Electrolytes:  K > 3.6 Mg >2 goal  caution with repletion given ESRD  Code Status: Full Code  Dispo: Home pending tunneled line placement with VIR and HD chair     Team Contact Information:   Primary Team: Internal Medicine (MEDU)  Primary Resident: Kenn File, MD  Resident's Pager: [phone] (Gen MedU Intern - Alvester Morin)    Interval History:   No adverse events overnight. Complains of right lower extremity leg pain  All other systems were reviewed and are negative except as noted in the HPI    Objective:   Temp:  [36.2 ??C (97.1 ??F)-36.6 ??C (97.9 ??F)] 36.2 ??C (97.1 ??F)  Heart Rate:  [60-73] 67  Resp:  [18-21] 18  BP: (124-178)/(58-90) 124/58  SpO2:  [95 %] 95 %    Gen:  NAD, converses normally. Laying in bed  HENT:  Atraumatic, normocephalic.  Heart:  RRR, no murmur/thrills/gallops.   Lungs:  Clear bilaterally without crackles or wheezes. Normal work of breathing on 4L  Extremities:   Pitting pedal edema right leg mildly larger than left. Erythema on right lower extremity  Neuro:  A&Ox3. Grossly symmetric, non-focal. Intact motor/sensation of legs.

## 2022-11-28 NOTE — Unmapped (Signed)
Alert and oriented x4. Breathing normal easy and regular on room air. Refused to use CPAP.  No acute shortness of breath. NPO since 0001. Medicated with pain reliever with fair effect for pain on right lower leg. No redness, no significant swelling. Independent in the room. Call bell and phone kept in reach. Bed kept at lowest position and brakes kept locked. Slept fairly well. Will monitor.    Problem: Adult Inpatient Plan of Care  Goal: Plan of Care Review  Outcome: Progressing  Flowsheets (Taken 11/28/2022 0523)  Progress: improving  Plan of Care Reviewed With: patient  Goal: Patient-Specific Goal (Individualized)  Outcome: Progressing  Flowsheets (Taken 11/28/2022 0523)  Individualized Care Needs: No falls thru end of shift.  Anxieties, Fears or Concerns: Pain will be tolerable thru end of shift.  Goal: Absence of Hospital-Acquired Illness or Injury  Outcome: Progressing  Intervention: Identify and Manage Fall Risk  Recent Flowsheet Documentation  Taken 11/28/2022 0200 by Lennox Grumbles, RN  Safety Interventions: fall reduction program maintained  Taken 11/27/2022 2000 by Lennox Grumbles, RN  Safety Interventions:   fall reduction program maintained   environmental modification  Intervention: Prevent Skin Injury  Recent Flowsheet Documentation  Taken 11/28/2022 0400 by Lennox Grumbles, RN  Positioning for Skin: Left  Taken 11/28/2022 0200 by Lennox Grumbles, RN  Positioning for Skin: Left  Taken 11/28/2022 0000 by Lennox Grumbles, RN  Positioning for Skin: Prone  Taken 11/27/2022 2200 by Lennox Grumbles, RN  Positioning for Skin: Supine/Back  Taken 11/27/2022 2100 by Lennox Grumbles, RN  Positioning for Skin: Supine/Back  Taken 11/27/2022 2000 by Lennox Grumbles, RN  Positioning for Skin: Supine/Back  Intervention: Prevent and Manage VTE (Venous Thromboembolism) Risk  Recent Flowsheet Documentation  Taken 11/27/2022 2100 by Lennox Grumbles, RN  Anti-Embolism Intervention: (heparin) Other (Comment)  Intervention: Prevent Infection  Recent Flowsheet Documentation  Taken 11/27/2022 2000 by Lennox Grumbles, RN  Infection Prevention: environmental surveillance performed  Goal: Optimal Comfort and Wellbeing  Outcome: Progressing  Goal: Readiness for Transition of Care  Outcome: Progressing  Goal: Rounds/Family Conference  Outcome: Progressing     Problem: Fall Injury Risk  Goal: Absence of Fall and Fall-Related Injury  Outcome: Progressing  Intervention: Promote Injury-Free Environment  Recent Flowsheet Documentation  Taken 11/28/2022 0200 by Lennox Grumbles, RN  Safety Interventions: fall reduction program maintained  Taken 11/27/2022 2000 by Lennox Grumbles, RN  Safety Interventions:   fall reduction program maintained   environmental modification     Problem: Pain Acute  Goal: Optimal Pain Control and Function  Outcome: Progressing     Problem: Hemodialysis  Goal: Safe, Effective Therapy Delivery  Outcome: Progressing  Goal: Effective Tissue Perfusion  Outcome: Progressing  Goal: Absence of Infection Signs and Symptoms  Outcome: Progressing  Intervention: Prevent or Manage Infection  Recent Flowsheet Documentation  Taken 11/27/2022 2000 by Lennox Grumbles, RN  Infection Management: aseptic technique maintained  Infection Prevention: environmental surveillance performed

## 2022-11-28 NOTE — Unmapped (Signed)
Problem: Hemodialysis  Goal: Safe, Effective Therapy Delivery  Outcome: Ongoing - Unchanged  Goal: Effective Tissue Perfusion  Outcome: Ongoing - Unchanged  Goal: Absence of Infection Signs and Symptoms  Outcome: Ongoing - Unchanged

## 2022-11-29 LAB — HEPATIC FUNCTION PANEL
ALBUMIN: 2.5 g/dL — ABNORMAL LOW (ref 3.4–5.0)
ALKALINE PHOSPHATASE: 159 U/L — ABNORMAL HIGH (ref 46–116)
ALT (SGPT): 41 U/L (ref 10–49)
AST (SGOT): 44 U/L — ABNORMAL HIGH (ref <=34)
BILIRUBIN DIRECT: 0.3 mg/dL (ref 0.00–0.30)
BILIRUBIN TOTAL: 0.6 mg/dL (ref 0.3–1.2)
PROTEIN TOTAL: 6.2 g/dL (ref 5.7–8.2)

## 2022-11-29 LAB — BASIC METABOLIC PANEL
ANION GAP: 6 mmol/L (ref 5–14)
BLOOD UREA NITROGEN: 28 mg/dL — ABNORMAL HIGH (ref 9–23)
BUN / CREAT RATIO: 5
CALCIUM: 8.5 mg/dL — ABNORMAL LOW (ref 8.7–10.4)
CHLORIDE: 101 mmol/L (ref 98–107)
CO2: 31 mmol/L (ref 20.0–31.0)
CREATININE: 5.1 mg/dL — ABNORMAL HIGH
EGFR CKD-EPI (2021) MALE: 13 mL/min/{1.73_m2} — ABNORMAL LOW (ref >=60)
GLUCOSE RANDOM: 106 mg/dL (ref 70–179)
POTASSIUM: 3.8 mmol/L (ref 3.4–4.8)
SODIUM: 138 mmol/L (ref 135–145)

## 2022-11-29 LAB — CBC
HEMATOCRIT: 26.4 % — ABNORMAL LOW (ref 39.0–48.0)
HEMOGLOBIN: 9.1 g/dL — ABNORMAL LOW (ref 12.9–16.5)
MEAN CORPUSCULAR HEMOGLOBIN CONC: 34.5 g/dL (ref 32.0–36.0)
MEAN CORPUSCULAR HEMOGLOBIN: 28.9 pg (ref 25.9–32.4)
MEAN CORPUSCULAR VOLUME: 83.7 fL (ref 77.6–95.7)
MEAN PLATELET VOLUME: 8.2 fL (ref 6.8–10.7)
PLATELET COUNT: 129 10*9/L — ABNORMAL LOW (ref 150–450)
RED BLOOD CELL COUNT: 3.16 10*12/L — ABNORMAL LOW (ref 4.26–5.60)
RED CELL DISTRIBUTION WIDTH: 13.8 % (ref 12.2–15.2)
WBC ADJUSTED: 8.6 10*9/L (ref 3.6–11.2)

## 2022-11-29 LAB — MAGNESIUM: MAGNESIUM: 1.9 mg/dL (ref 1.6–2.6)

## 2022-11-29 LAB — TACROLIMUS LEVEL, TIMED: TACROLIMUS BLOOD: 5 ng/mL

## 2022-11-29 LAB — PHOSPHORUS: PHOSPHORUS: 4.8 mg/dL (ref 2.4–5.1)

## 2022-11-29 MED ADMIN — carvedilol (COREG) tablet 6.25 mg: 6.25 mg | ORAL | @ 01:00:00

## 2022-11-29 MED ADMIN — sevelamer (RENVELA) tablet 1,600 mg: 1600 mg | ORAL | @ 20:00:00

## 2022-11-29 MED ADMIN — Propofol (DIPRIVAN) injection: INTRAVENOUS | @ 14:00:00 | Stop: 2022-11-29

## 2022-11-29 MED ADMIN — HYDROmorphone (DILAUDID) tablet 2 mg: 2 mg | ORAL | @ 20:00:00 | Stop: 2022-12-01

## 2022-11-29 MED ADMIN — ropivacaine (NAROPIN) 5 mg/mL (0.5 %) injection: PERINEURAL | @ 13:00:00 | Stop: 2022-11-29

## 2022-11-29 MED ADMIN — heparin (porcine) injection: INTRAVENOUS | @ 14:00:00 | Stop: 2022-11-29

## 2022-11-29 MED ADMIN — HYDROmorphone (DILAUDID) tablet 2 mg: 2 mg | ORAL | @ 02:00:00 | Stop: 2022-11-30

## 2022-11-29 MED ADMIN — dexmedeTOMIDine (Precedex) 200 mcg in sodium chloride (NS) 0.9 % 50 mL infusion: INTRAVENOUS | @ 16:00:00 | Stop: 2022-11-29

## 2022-11-29 MED ADMIN — sodium chloride bacteriostatic 0.9 % injection: @ 14:00:00 | Stop: 2022-11-29

## 2022-11-29 MED ADMIN — mycophenolate (CELLCEPT) capsule 250 mg: 250 mg | ORAL | @ 01:00:00

## 2022-11-29 MED ADMIN — lactated Ringers infusion: INTRAVENOUS | @ 14:00:00 | Stop: 2022-11-29

## 2022-11-29 MED ADMIN — insulin lispro (HumaLOG) injection 35 Units: 35 [IU] | SUBCUTANEOUS | @ 20:00:00

## 2022-11-29 MED ADMIN — insulin lispro (HumaLOG) injection 0-20 Units: 0-20 [IU] | SUBCUTANEOUS | @ 20:00:00

## 2022-11-29 MED ADMIN — dexAMETHasone (DECADRON) 4 mg/mL injection: INTRAVENOUS | @ 14:00:00 | Stop: 2022-11-29

## 2022-11-29 MED ADMIN — heparin (porcine) 5,000 unit/mL injection 7,500 Units: 7500 [IU] | SUBCUTANEOUS | @ 10:00:00

## 2022-11-29 MED ADMIN — insulin glargine (LANTUS) injection 15 Units: 15 [IU] | SUBCUTANEOUS | @ 01:00:00

## 2022-11-29 MED ADMIN — midazolam (VERSED) injection: INTRAVENOUS | @ 13:00:00 | Stop: 2022-11-29

## 2022-11-29 MED ADMIN — fentaNYL (PF) (SUBLIMAZE) injection: INTRAVENOUS | @ 14:00:00 | Stop: 2022-11-29

## 2022-11-29 MED ADMIN — fentaNYL (PF) (SUBLIMAZE) injection: INTRAVENOUS | @ 13:00:00 | Stop: 2022-11-29

## 2022-11-29 MED ADMIN — sodium chloride (NS) 0.9 % infusion: 10 mL/h | INTRAVENOUS | @ 04:00:00

## 2022-11-29 MED ADMIN — insulin lispro (HumaLOG) injection 0-20 Units: 0-20 [IU] | SUBCUTANEOUS | @ 01:00:00

## 2022-11-29 MED ADMIN — HYDROmorphone (DILAUDID) tablet 2 mg: 2 mg | ORAL | @ 10:00:00 | Stop: 2022-12-01

## 2022-11-29 MED ADMIN — ondansetron (ZOFRAN) injection 4 mg: 4 mg | INTRAVENOUS | @ 15:00:00

## 2022-11-29 MED ADMIN — tacrolimus (PROGRAF) capsule 1 mg: 1 mg | ORAL | @ 01:00:00

## 2022-11-29 MED ADMIN — heparin (porcine) 5,000 unit/mL injection 7,500 Units: 7500 [IU] | SUBCUTANEOUS | @ 01:00:00

## 2022-11-29 MED ADMIN — ceFAZolin (ANCEF) 3 g in sodium chloride (NS) 0.9 % 100 mL IVPB: 3 g | INTRAVENOUS | @ 14:00:00 | Stop: 2022-11-29

## 2022-11-29 MED ADMIN — heparin (porcine) injection: @ 14:00:00 | Stop: 2022-11-29

## 2022-11-29 MED ADMIN — sevelamer (RENVELA) tablet 1,600 mg: 1600 mg | ORAL | @ 22:00:00

## 2022-11-29 MED ADMIN — HYDROmorphone (PF) injection Syrg 0.5 mg: .5 mg | INTRAVENOUS | @ 21:00:00 | Stop: 2022-11-29

## 2022-11-29 NOTE — Unmapped (Signed)
Arteriovenous Fistula Procedure Note     Indications: ESRD on hemodialysis    Pre-operative Diagnosis:  Chronic renal insufficiency   ESRD     Post-operative Diagnosis: same.     Operation: Creation of Right upper extremity autogenous AV fistula, brachio cephalic       Surgeon: Dyann Ruddle, MD      Assistants: Elyse Jarvis, M3     Anesthesia: IV regional     ASA Class: 4     Procedure Details   The patient was seen in the Holding Room. The risks, benefits, complications, treatment options, and expected outcomes were discussed with the patient. The patient concurred with the proposed plan, giving informed consent.  The site of surgery properly noted/marked. The patient was taken to Operating Room # 2, identified as [facility] and the procedure verified as AV Fistula creation of the right arm. A Time Out was held and the above information confirmed.     The patient was brought to the Operating Room. The right arm prepped and draped in a sterile fashion. The block was tested and had set up well. An initial skin incision was made vertically just below the antecubital fossa. A 4mm antebrachial was identified and encircled. It was followed to the medial cephalic. The antebrachial was small, 3mm, the medial cephalic was much larger, 5mm. I initially created a proximal radial to antebrachihal fistula, but the flow was inadequate. The deep communicating branch was ligated.  It was dissected proximally and distally and a venotomy was made and a 4 mm coronary dilator passed into the cephalic vein. It flushed nicely with heparinized saline. The very distal brachial artery was then identified and freed for 2 cm. 2000 units of heparin was administered by anesthesia. An end-to-side.anastomosis between the cephalic vein and the brachial artery was created using 6-0 Prolene sutures in a continuous running manner. After completion of the anastomosis, the vessel loops were released. Hemostasis was secured. A good thril was noted in the fistula and the incision was then closed in two layers, subcutaneous tissue with 3-0 Vicryl and skin with 4-0 Monocryl. Steri-Strips were applied.     Findings:   Thrill prtesent at the end of the procedure     Estimate Blood Loss:  Minimal           Drains: none           Total IV Fluids:           Specimens: None           Implants: none           Complications:  None; patient tolerated the procedure well.           Disposition: PACU - hemodynamically stable.           Condition: stable     Attending Attestation: I was present and scrubbed for the entire procedure

## 2022-11-29 NOTE — Unmapped (Signed)
Surgery Consult Note         Assessment:    ESRD on hemodialysis  Currently with acute catheter  Will need conversion to tunneled catheter  I am asked to create peripheral angioaccess  Exam and PVL- mapping suggest that a right upper arm cephalic vein AVF is the best first option        Plan:    Creation of AVF in the right arm. Due to body habitus this will be a planned 2 stage procedure  He will require a second stage elevation for the fistula to be functional. I considered an AV graft, but in light of his young age, 69, and immunosuppression which puts him at greater risk for infection, I believe that an AVF is a better long term choice for him, despite the increased amount of time for maturity.         History of Present Illness:    Reason for Consult:  ESRD, peripheral angioaccess requested for hemodialysis and to facilitate catheter removal    Richard [name] is a  57 y.o. year old male who is seen in consultation at the request of  nephrology  for evaluation of long term angioaccess for hemodialysis.  He has a history of a liver transplant. He has had progressive deteriorating renal function. He presented with AKI on chronic disease and has required the initiation of hemodialysis using a right internal jugular CVC. I was contacted about proceeding with access creation as an inpatient to try to minimize catheter contact time.  .       Allergies    Patient has no known allergies.      Medications      Current Facility-Administered Medications   Medication Dose Route Frequency Provider Last Rate Last Admin    acetaminophen (TYLENOL) tablet 1,000 mg  1,000 mg Oral Once Margarito Liner, MD        [Transfer Hold] aluminum-magnesium hydroxide-simethicone (MAALOX MAX) 80-80-8 mg/mL oral suspension  30 mL Oral Q4H PRN Barnett Abu, MD   30 mL at 11/17/22 1244    [Transfer Hold] amlodipine (NORVASC) tablet 10 mg  10 mg Oral Daily Barnett Abu, MD   10 mg at 11/28/22 1352    [Transfer Hold] carvedilol (COREG) tablet 6.25 mg  6.25 mg Oral BID Barnett Abu, MD   6.25 mg at 11/28/22 2109    ceFAZolin (ANCEF) 3 g in sodium chloride (NS) 0.9 % 100 mL IVPB  3 g Intravenous For OR use Dyann Ruddle, MD        Atlee Abide Hold] dextrose (D10W) 10% bolus 125 mL  12.5 g Intravenous Q10 Min PRN Barnett Abu, MD 500 mL/hr at 11/22/22 0016 125 mL at 11/22/22 0016    [Transfer Hold] diclofenac sodium (VOLTAREN) 1 % gel 2 g  2 g Topical QID Barnett Abu, MD   2 g at 11/26/22 1740    [Transfer Hold] fluticasone propionate (FLONASE) 50 mcg/actuation nasal spray 1 spray  1 spray Each Nare Daily Marja Kays, MD   1 spray at 11/23/22 0916    [Transfer Hold] gadobenate dimeglumine (MULTIHANCE) 529 mg/mL (0.48mmol/0.2mL) solution 10 mL  10 mL Intravenous Once in imaging Zollie Beckers, MD        gentamicin-sodium citrate lock solution in NS  1.4 mL Intra-cannular Each time in dialysis PRN [name], MD   1.4 mL at 11/28/22 1227    gentamicin-sodium citrate lock solution in NS  1.4 mL Intra-cannular  Each time in dialysis PRN [name], MD   1.4 mL at 11/28/22 1226    [Transfer Hold] glucagon injection 1 mg  1 mg Intramuscular Once PRN Barnett Abu, MD        [Transfer Hold] glucose chewable tablet 16 g  16 g Oral Q10 Min PRN Barnett Abu, MD        [Transfer Hold] guaiFENesin (ROBITUSSIN) oral syrup  200 mg Oral Q4H PRN Barnett Abu, MD   200 mg at 11/25/22 0021    heparin (porcine) 5,000 unit/mL injection 5,000 Units  5,000 Units Subcutaneous Once Dyann Ruddle, MD        [Transfer Hold] heparin (porcine) 5,000 unit/mL injection 7,500 Units  7,500 Units Subcutaneous Q8H [facility] Barnett Abu, MD   7,500 Units at 11/29/22 0540    [Transfer Hold] HYDROmorphone (DILAUDID) tablet 2 mg  2 mg Oral Q8H PRN Kenn File, MD   2 mg at 11/29/22 0559    [Transfer Hold] hydrOXYzine (ATARAX) tablet 25 mg  25 mg Oral Q6H PRN Achille Rich, MD   25 mg at 11/24/22 2138    [Transfer Hold] insulin glargine (LANTUS) injection 15 Units  15 Units Subcutaneous Nightly Marja Kays, MD   7.5 Units at 11/28/22 2113    [Transfer Hold] insulin lispro (HumaLOG) injection 0-20 Units  0-20 Units Subcutaneous ACHS Barnett Abu, MD   3 Units at 11/28/22 2113    [Transfer Hold] insulin lispro (HumaLOG) injection 35 Units  35 Units Subcutaneous TID AC Roanna Raider, MD   35 Units at 11/28/22 1806    lactated Ringers infusion  10 mL/hr Intravenous Continuous Dyann Ruddle, MD        Atlee Abide Hold] lidocaine 4 % patch 1 patch  1 patch Transdermal Daily Marja Kays, MD        Atlee Abide Hold] melatonin tablet 3 mg  3 mg Oral Nightly PRN Barnett Abu, MD   3 mg at 11/24/22 2138    [Transfer Hold] menthol (COUGH DROPS) lozenge 1 lozenge  1 lozenge Oral Q2H PRN Chagarlamudi, Eloy End, MD   1 lozenge at 11/25/22 0109    [Transfer Hold] mycophenolate (CELLCEPT) capsule 250 mg  250 mg Oral BID Barnett Abu, MD   250 mg at 11/28/22 2109    [Transfer Hold] ondansetron (ZOFRAN-ODT) disintegrating tablet 4 mg  4 mg Oral Q8H PRN Barnett Abu, MD        Or    Atlee Abide Hold] ondansetron [facility]) injection 4 mg  4 mg Intravenous Q8H PRN Barnett Abu, MD        [Transfer Hold] pantoprazole (Protonix) EC tablet 40 mg  40 mg Oral Daily Barnett Abu, MD   40 mg at 11/28/22 1352    [Transfer Hold] polyethylene glycol (MIRALAX) packet 17 g  17 g Oral Daily Roanna Raider, MD        [Transfer Hold] sevelamer (RENVELA) tablet 1,600 mg  1,600 mg Oral 3xd Meals Marja Kays, MD   1,600 mg at 11/28/22 1807    [Transfer Hold] simethicone (MYLICON) chewable tablet 80 mg  80 mg Oral Q6H PRN Barnett Abu, MD        sodium chloride (NS) 0.9 % infusion  10 mL/hr Intravenous Continuous Dyann Ruddle, MD 10 mL/hr at 11/29/22 0012 10 mL/hr at 11/29/22 0012    sodium ferric gluconate (FERRLECIT) 125 mg in sodium chloride (NS) 0.9 % 100 mL IVPB  125  mg Intravenous q7DAYS (dialysis) Mottl, Odis Hollingshead, MD        [Provider Hold] spironolactone (ALDACTONE) tablet 25 mg  25 mg Oral Daily Barnett Abu, MD   25 mg at 11/17/22 1404    [Transfer Hold] tacrolimus (PROGRAF) capsule 1 mg  1 mg Oral BID Barnett Abu, MD   1 mg at 11/28/22 2109         Past Medical History    Past Medical History:   Diagnosis Date    COVID-19 07/18/2019    Family history of malignant neoplasm of prostate 06/23/2015    Hepatic cirrhosis (CMS-HCC) 06/23/2015    Overview:  Secondary to NASH; followed by Dr. Yevonne Pax, GI.  Last Assessment & Plan:  Relevant Hx: Course: Daily Update: Today's Plan:     Hypertension     Hypogonadism in male 02/12/2014    Liver cirrhosis secondary to NASH (nonalcoholic steatohepatitis) (CMS-HCC)     s/p liver transplant 2013         Past Surgical History    Past Surgical History:   Procedure Laterality Date    liver transpant      LIVER TRANSPLANTATION  2013    PR COLONOSCOPY W/BIOPSY SINGLE/MULTIPLE N/A 08/13/2018    Procedure: COLONOSCOPY, FLEXIBLE, PROXIMAL TO SPLENIC FLEXURE; WITH BIOPSY, SINGLE OR MULTIPLE;  Surgeon: Neysa Hotter, MD;  [facility];  Service: Gastroenterology    PR COLSC FLX W/RMVL OF TUMOR POLYP LESION SNARE TQ N/A 08/13/2018    Procedure: COLONOSCOPY FLEX; W/REMOV TUMOR/LES BY SNARE;  Surgeon: Neysa Hotter, MD;  Location: [facility];  Service: Gastroenterology    PR UPPER GI ENDOSCOPY,BIOPSY N/A 07/13/2015    Procedure: UGI ENDOSCOPY; WITH BIOPSY, SINGLE OR MULTIPLE;  Surgeon: Joaquin Music, MD;  Location: [facility];  Service: Gastroenterology         Family History    Family History   Problem Relation Age of Onset    Diabetes Father     Liver disease Maternal Uncle     Diabetes Paternal Grandfather     Melanoma Neg Hx     Basal cell carcinoma Neg Hx     Squamous cell carcinoma Neg Hx     Kidney disease Neg Hx          Social History:    Tobacco Use:  denies, Alcohol Use:  denies, and Drug Use:  denies      Review of Systems    A 12 system review of systems was negative except as noted in HPI        Vital Signs    BP 154/83  - Pulse 71  - Temp 36 ??C (96.8 ??F) (Temporal)  - Resp 21  - Ht 177.8 cm (5' 10)  - Wt (!) 138.6 kg (305 lb 8.9 oz)  - SpO2 92%  - BMI 43.84 kg/m??   Body mass index is 43.84 kg/m??.      Physical Exam    Thick arms, upper and forearms. Any fistula will require elevation. Palpable radial and ulnar pulses bilaterally.         Test Results    All lab results last 24 hours:    Recent Results (from the past 24 hour(s))   POCT Glucose    Collection Time: 11/28/22  1:35 PM   Result Value Ref Range    Glucose, POC 140 70 - 179 mg/dL   POCT Glucose    Collection Time: 11/28/22  4:18 PM   Result Value Ref Range    Glucose, POC 206 (H) 70 - 179 mg/dL   POCT Glucose    Collection Time: 11/28/22  9:07 PM   Result Value Ref Range    Glucose, POC 204 (H) 70 - 179 mg/dL   Basic Metabolic Panel    Collection Time: 11/29/22  5:36 AM   Result Value Ref Range    Sodium 138 135 - 145 mmol/L    Potassium 3.8 3.4 - 4.8 mmol/L    Chloride 101 98 - 107 mmol/L    CO2 31.0 20.0 - 31.0 mmol/L    Anion Gap 6 5 - 14 mmol/L    BUN 28 (H) 9 - 23 mg/dL    Creatinine 0.27 (H) 0.73 - 1.18 mg/dL    BUN/Creatinine Ratio 5     eGFR CKD-EPI (2021) Male 13 (L) >=60 mL/min/1.70m2    Glucose 106 70 - 179 mg/dL    Calcium 8.5 (L) 8.7 - 10.4 mg/dL   CBC    Collection Time: 11/29/22  5:36 AM   Result Value Ref Range    WBC 8.6 3.6 - 11.2 10*9/L    RBC 3.16 (L) 4.26 - 5.60 10*12/L    HGB 9.1 (L) 12.9 - 16.5 g/dL    HCT 25.3 (L) 66.4 - 48.0 %    MCV 83.7 77.6 - 95.7 fL    MCH 28.9 25.9 - 32.4 pg    MCHC 34.5 32.0 - 36.0 g/dL    RDW 40.3 47.4 - 25.9 %    MPV 8.2 6.8 - 10.7 fL    Platelet 129 (L) 150 - 450 10*9/L   Hepatic Function Panel    Collection Time: 11/29/22  5:36 AM   Result Value Ref Range    Albumin 2.5 (L) 3.4 - 5.0 g/dL    Total Protein 6.2 5.7 - 8.2 g/dL    Total Bilirubin 0.6 0.3 - 1.2 mg/dL    Bilirubin, Direct 5.63 0.00 - 0.30 mg/dL    AST 44 (H) <=87 U/L    ALT 41 10 - 49 U/L    Alkaline Phosphatase 159 (H) 46 - 116 U/L   Magnesium Level    Collection Time: 11/29/22  5:36 AM   Result Value Ref Range    Magnesium 1.9 1.6 - 2.6 mg/dL   Phosphorus Level    Collection Time: 11/29/22  5:36 AM   Result Value Ref Range    Phosphorus 4.8 2.4 - 5.1 mg/dL   POCT Glucose    Collection Time: 11/29/22  7:23 AM   Result Value Ref Range    Glucose, POC 110 70 - 179 mg/dL       Imaging:   Examination Protocol:  The Duplex scanner is used on the requested extremity to obtain cross sectional diameter measurements of cephalic veins from the shoulder to the wrist and basilic veins from their origin to the elbow. Doppler spectral waveforms are obtained from subclavian, brachial, radial, and ulnar arteries. The brachial and radial arteries are scanned for the presence of plaque and diameter measurements are recorded.     +-----------------+-----------------+--------------+----------------+  -Right            -Cephalic Diameter-Cephalic Depth-Basilic Diameter-  +-----------------+-----------------+--------------+----------------+  -         Shoulder-           4.7 mm-       18.3 mm-                -  +-----------------+-----------------+--------------+----------------+  -  Prox upper arm-           5.2 mm-       17.1 mm-                -  +-----------------+-----------------+--------------+----------------+  -    Mid upper arm-           4.9 mm-       11.7 mm-3.5 mm          -  +-----------------+-----------------+--------------+----------------+  -   Dist upper arm-           5.7 mm-        8.0 mm-3.2 mm          -  +-----------------+-----------------+--------------+----------------+  -Antecubital fossa-           4.9 mm-        5.9 mm-2.2 mm          -  +-----------------+-----------------+--------------+----------------+  -     Prox forearm-           2.9 mm-       12.4 mm-                -  +-----------------+-----------------+--------------+----------------+  -      Mid forearm-                 -              - -  +-----------------+-----------------+--------------+----------------+  -     Dist forearm-                 -              -                -  +-----------------+-----------------+--------------+----------------+  -            Wrist-                 -              -                -  +-----------------+-----------------+--------------+----------------+  Right Basilic Vein Length = 10.0 cm  Right Comment: Cephalic vein not visualized in mid-distal forearm due to a line.     +-----------------+----------------+--------------+----------------+  -Left             -Cephalic        -Cephalic Depth-Basilic Diameter-  -                 -Diameter        -              -                -  +-----------------+----------------+--------------+----------------+  -         Shoulder-          4.0 mm-       22.6 mm-                -  +-----------------+----------------+--------------+----------------+  -   Prox upper arm-          3.4 mm-       15.6 mm-                -  +-----------------+----------------+--------------+----------------+  -    Mid upper arm-          3.4 mm-       13.2 mm-                -  +-----------------+----------------+--------------+----------------+  -  Dist upper arm-          2.9 mm-       11.4 mm-4.1 mm          -  +-----------------+----------------+--------------+----------------+  -Antecubital fossa-          2.3 mm-       12.2 mm-3.5 mm          -  +-----------------+----------------+--------------+----------------+  -     Prox forearm-          1.3 mm-       11.4 mm-2.9 mm          -  +-----------------+----------------+--------------+----------------+  -      Mid forearm-          3.1 mm-       14.9 mm-1.5 mm          -  +-----------------+----------------+--------------+----------------+  -     Dist forearm-          2.2 mm-       10.3 mm-                -  +-----------------+----------------+--------------+----------------+  -            Wrist-          1.9 mm-       11.5 mm- -  +-----------------+----------------+--------------+----------------+  Basilic Vein Length = 18.0 cm     Arteries  +-----------+-------+   +-------+  -           -Right  -   -Left   -  +-----------+-------+   +-------+  -           -       -   -       -  -           -       -   -       -  -   Brachial- 5.2 mm-   - 4.9 mm-  -           -       -   -       -  +-----------+-------+   +-------+  -Radial Prox-2.9 mm -   -3.0 mm -  +-----------+-------+   +-------+  -Radial Mid -       -   -2.4 mm -  +-----------+-------+   +-------+  -Radial Dist-2.6 mm -   -2.6 mm -  +-----------+-------+   +-------+  Arterial Findings  Right: Duplex waveforms in the ulnar, radial, brachial and axillary artery         appear multiphasic with sharp systolic upstrokes. No plaque visualized in         the brachial artery. No plaque visualized in the radial artery. Radial         artery not visualized in mid forearm due to a line.  Left:  Duplex waveforms in the ulnar, radial, brachial and axillary artery         appear multiphasic with sharp systolic upstrokes. No plaque visualized in         the brachial artery. No plaque visualized in the radial artery.          Imaging    PVL Vessel Mapping For Hemodialysis Bilateral (Order: [id number]) - 11/21/2022      Problem List    Patient Active Problem List   Diagnosis    Hypertension    Acanthosis  nigricans    Type 2 diabetes mellitus with hyperglycemia, with long-term current use of insulin (CMS-HCC)    H/O gastroesophageal reflux (GERD)    Liver replaced by transplant (CMS-HCC)    Class 3 obesity (CMS-HCC)    Obstructive sleep apnea syndrome    Proteinuria due to type 2 diabetes mellitus (CMS-HCC)    Immunosuppression due to drug therapy (CMS-HCC)    ESRD (end stage renal disease) on dialysis (CMS-HCC)    Type 2 MI (myocardial infarction) (CMS-HCC)    (HFpEF) heart failure with preserved ejection fraction (CMS-HCC)    Severe hyperglycemia due to diabetes mellitus (CMS-HCC)    Cellulitis of right lower extremity    Acute kidney injury superimposed on chronic kidney disease (CMS-HCC)    CAD (coronary artery disease)    Elevated liver enzymes    Hyperphosphatemia

## 2022-11-29 NOTE — Unmapped (Signed)
Internal Medicine (MEDU) Progress Note    Assessment & Plan:   Richard [name] is a 57 y.o. male with PMH NASH cirrhosis s/p OLT 2013, HFpEF, CKD5, persistent A-fib not on AC, CAD, HTN, OSA not on CPAP who presented to [facility] 5/22 with RLE cellulitis, currently in a prolonged admission due to AKI on CKD, elevated LFTs, and O2 requirement iso volume overload, now with progression to ESRD and tolerating HD via RIJ trialysis.    Principal Problem:    ESRD (end stage renal disease) on dialysis (CMS-HCC)  Active Problems:    Hypertension    Liver replaced by transplant (CMS-HCC)    Immunosuppression due to drug therapy (CMS-HCC)    (HFpEF) heart failure with preserved ejection fraction (CMS-HCC)    Severe hyperglycemia due to diabetes mellitus (CMS-HCC)    Cellulitis of right lower extremity    Acute kidney injury superimposed on chronic kidney disease (CMS-HCC)    CAD (coronary artery disease)    Elevated liver enzymes    Hyperphosphatemia  Resolved Problems:    Hypomagnesemia    Hypokalemia    Active Problems    ESRD - Hyperphosphatemia, improving   Has been tolerating recent sessions of dialysis, will obtain more permanent access with tunneled line.  Volume status now improved.  BUN  and phosphorous levels with significant improvements.  Plan to continue sevelamer.  Discussed with VIR plan for tunneled line 6/4 or 6/5. CM working on dialysis chair placement.    - Nephro following, appreciate recs  - Continue HD per Nephro   - Tunneled Line 6/6, NPO at midnight   - Decrease PO sevelamer 1600 mg TID  - Hold Spironolactone, Lasix  - Avoid NSAIDs and Morphine   - Daily BMP, Mg, Phos  - AV fistula placed at [facility] on 6/5    T2DM   BG remain wnl. Continue regimen below.   - Lantus 15u nightly  - Humalog 35u at mealtimes (TID) (home 55u)  - SSI  - POCT BG QID    Elevated LFTs, slowly improving - s/p Liver Transplant in 2013  LFTs elevated on admission, slowly improving the setting of dialysis.  Most likely due to congestive hepatopathy.  Difficulty throughout hospitalization obtaining MRCP given need for general anesthesia, per hepatology okay to defer to outpatient, MRCP canceled.  - Continue home Cellcept, Tacrolimus (goal 2-4, dosing per pharmacy)  - outpatient MRCP  - Ok to dose Tylenol <4g per day  - Daily HFP    Intermittent Oxygen Requirement - OSA, not on CPAP - Pulmonary Edema  Patient previously with pulmonary edema in the setting of volume overload from kidney disease and heart failure, significant volume removed via multiple sessions of dialysis and patient weaned to room air.  Will intermittently be placed on oxygen overnight for subjective shortness of breath.  Patient likely has OSA, discussed CPAP, patient amenable to trying.  If oxygen requirement persist despite CPAP use, consider repeat chest x-ray and RPP to rule out viral infection.  - CPAP  - Outpatient sleep study (order was put in on 11/16/2022)  - Consider RPP     RLE Cellulitis, resolved - R Leg Pain  Patient with multiple episodes of cellulitis presented with right lower extremity cellulitis. Cellulitis has resolved with abx course as below.  Patient with pain in right leg, with right leg mildly larger than left.  PVLs ordered to rule out clot.  - Bcx 5/22: NGTD  - Bcx 5/24: NGTD  - Antibiotics:    -  s/p Clindamycin (5/22)  - s/p Vancomycin (5/23)   - s/p Doxycycline (5/24 - 5/25)   - s/p Ceftriaxone (5/25)   - s/p Cefepime (5/25 - 5/28)   - s/p Linezolid (5/25 - 5/31)   - s/p Ciprofloxacin (5/28 - 5/31)  - Daily CBC  - PO dilaudid PRN  - PVLs LE 6/3 bilateral without evidence of clot    Anemia of chronic disease  No apparent blood loss, no bright red blood per rectum or melena or hematuria. Has been hemodynamically stable.  -Epo to be set up with HD as outpatient  -Daily CBC  -Monitor for bleeding      Chronic Conditions  Hypertension: Continue amlodipine 10 mg, Coreg 6.25 mg twice daily.   HFpEF - Hx A-Fib - CAD: hold spironolactone iso worsening renal function, continue carvedilol      Daily Checklist:  Diet: Regular Diet  DVT PPx:  heparin SQ  Electrolytes:  K > 3.6 Mg >2 goal  caution with repletion given ESRD  Code Status: Full Code  Dispo: Home pending tunneled line placement with VIR and HD chair     Team Contact Information:   Primary Team: Internal Medicine (MEDU)  Primary Resident: Kenn File, MD  Resident's Pager: [phone] (Gen MedU Intern - Alvester Morin)    Interval History:   No adverse events overnight. All other systems were reviewed and are negative except as noted in the HPI    Objective:   Temp:  [36 ??C (96.8 ??F)-37.6 ??C (99.7 ??F)] 36.2 ??C (97.2 ??F)  Heart Rate:  [54-71] 57  SpO2 Pulse:  [54-74] 56  Resp:  [11-27] 20  BP: (109-154)/(61-91) 132/86  SpO2:  [92 %-99 %] 92 %    Gen:  NAD, converses normally. Laying in bed  HENT:  Atraumatic, normocephalic.  Heart:  RRR, no murmur/thrills/gallops.   Lungs:  Clear bilaterally without crackles or wheezes. Normal work of breathing on 4L  Extremities:   Pitting pedal edema right leg mildly larger than left. Erythema on right lower extremity  Neuro:  A&Ox3. Grossly symmetric, non-focal. Intact motor/sensation of legs.

## 2022-11-29 NOTE — Unmapped (Signed)
Tacrolimus Therapeutic Monitoring Pharmacy Note    Richard [name] is a 56 y.o. male continuing tacrolimus.     Indication: Liver transplant     Date of Transplant:  2013       Prior Dosing Information: Current regimen 1 mg PO BID      Source(s) of information used to determine prior to admission dosing: Home Medication List or Clinic Note    Goals:  Therapeutic Drug Levels  Tacrolimus trough goal:  2-4 ng/mL  (per 09/16/21 Clinic Note, most recent note Identified)    Additional Clinical Monitoring/Outcomes  Monitor renal function (SCr and urine output) and liver function (LFTs)  Monitor for signs/symptoms of adverse events (e.g., hyperglycemia, hyperkalemia, hypomagnesemia, hypertension, headache, tremor)    Results:   Tacrolimus level:  5.0 ng/mL, drawn drawn as ~ 9 hr level, expect true trough to be slightly lower    Pharmacokinetic Considerations and Significant Drug Interactions:  Concurrent hepatotoxic medications: None identified  Concurrent CYP3A4 substrates/inhibitors: None identified  Concurrent nephrotoxic medications: None identified    Assessment/Plan:  Recommendedation(s)  Continue current regimen of 1 mg PO BID    Follow-up  Next level should be ordered on 12/01/22 at 0600 .   A pharmacist will continue to monitor and recommend levels as appropriate    Please page service pharmacist with questions/clarifications.    Phoebe Perch, PharmD

## 2022-11-29 NOTE — Unmapped (Signed)
Patient remains alert and oriented x 4. Vitals stable. Med complaint. No complains of pain. Slept well overnight. NPO pending tunneled line placement. Resting quietly in bed at this time. Dilaudid given PRN for pain to the RLE extremity with relief. Sleeping after medication intervention. Denies any needs or concerns at this time. Plan of care ongoing. Call bell within reach. Instructed to call for assistance when needed.       Problem: Adult Inpatient Plan of Care  Goal: Plan of Care Review  Outcome: Progressing  Flowsheets (Taken 11/29/2022 0022)  Progress: improving  Plan of Care Reviewed With: patient  Goal: Patient-Specific Goal (Individualized)  Outcome: Progressing  Goal: Absence of Hospital-Acquired Illness or Injury  Outcome: Progressing  Intervention: Identify and Manage Fall Risk  Flowsheets (Taken 11/29/2022 0022)  Safety Interventions:   nonskid shoes/slippers when out of bed   lighting adjusted for tasks/safety   low bed  Intervention: Prevent and Manage VTE (Venous Thromboembolism) Risk  Recent Flowsheet Documentation  Taken 11/29/2022 0000 by Gertie Baron, RN  Anti-Embolism Intervention: (heparin sq) Other (Comment)  Taken 11/28/2022 2200 by Gertie Baron, RN  Anti-Embolism Intervention: (heparin sq) Other (Comment)  Taken 11/28/2022 2000 by Gertie Baron, RN  Anti-Embolism Intervention: (heparin sq) Other (Comment)  Intervention: Prevent Infection  Flowsheets (Taken 11/29/2022 0022)  Infection Prevention: rest/sleep promoted  Goal: Optimal Comfort and Wellbeing  Outcome: Progressing  Intervention: Monitor Pain and Promote Comfort  Flowsheets (Taken 11/29/2022 0022)  Pain Management Interventions:   quiet environment facilitated   relaxation techniques promoted  Intervention: Provide Person-Centered Care  Flowsheets (Taken 11/29/2022 0022)  Trust Relationship/Rapport:   questions answered   questions encouraged  Goal: Readiness for Transition of Care  Outcome: Progressing  Goal: Rounds/Family Conference  Outcome: Progressing     Problem: Fall Injury Risk  Goal: Absence of Fall and Fall-Related Injury  Outcome: Progressing  Intervention: Promote Scientist, clinical (histocompatibility and immunogenetics) Documentation  Taken 11/29/2022 0022 by Gertie Baron, RN  Safety Interventions:   nonskid shoes/slippers when out of bed   lighting adjusted for tasks/safety   low bed     Problem: Pain Acute  Goal: Optimal Pain Control and Function  Outcome: Progressing  Intervention: Develop Pain Management Plan  Recent Flowsheet Documentation  Taken 11/29/2022 0022 by Gertie Baron, RN  Pain Management Interventions:   quiet environment facilitated   relaxation techniques promoted     Problem: Hemodialysis  Goal: Safe, Effective Therapy Delivery  Outcome: Progressing  Goal: Effective Tissue Perfusion  Outcome: Progressing  Goal: Absence of Infection Signs and Symptoms  Outcome: Progressing  Intervention: Prevent or Manage Infection  Recent Flowsheet Documentation  Taken 11/29/2022 0022 by Gertie Baron, RN  Infection Prevention: rest/sleep promoted

## 2022-11-30 LAB — CBC
HEMATOCRIT: 25.8 % — ABNORMAL LOW (ref 39.0–48.0)
HEMOGLOBIN: 8.6 g/dL — ABNORMAL LOW (ref 12.9–16.5)
MEAN CORPUSCULAR HEMOGLOBIN CONC: 33.4 g/dL (ref 32.0–36.0)
MEAN CORPUSCULAR HEMOGLOBIN: 27.9 pg (ref 25.9–32.4)
MEAN CORPUSCULAR VOLUME: 83.6 fL (ref 77.6–95.7)
MEAN PLATELET VOLUME: 9.2 fL (ref 6.8–10.7)
PLATELET COUNT: 157 10*9/L (ref 150–450)
RED BLOOD CELL COUNT: 3.08 10*12/L — ABNORMAL LOW (ref 4.26–5.60)
RED CELL DISTRIBUTION WIDTH: 14 % (ref 12.2–15.2)
WBC ADJUSTED: 12 10*9/L — ABNORMAL HIGH (ref 3.6–11.2)

## 2022-11-30 LAB — BASIC METABOLIC PANEL
ANION GAP: 10 mmol/L (ref 5–14)
BLOOD UREA NITROGEN: 39 mg/dL — ABNORMAL HIGH (ref 9–23)
BUN / CREAT RATIO: 6
CALCIUM: 8.6 mg/dL — ABNORMAL LOW (ref 8.7–10.4)
CHLORIDE: 99 mmol/L (ref 98–107)
CO2: 27 mmol/L (ref 20.0–31.0)
CREATININE: 6.58 mg/dL — ABNORMAL HIGH
EGFR CKD-EPI (2021) MALE: 9 mL/min/{1.73_m2} — ABNORMAL LOW (ref >=60)
GLUCOSE RANDOM: 259 mg/dL — ABNORMAL HIGH (ref 70–99)
POTASSIUM: 4.6 mmol/L (ref 3.4–4.8)
SODIUM: 136 mmol/L (ref 135–145)

## 2022-11-30 LAB — PHOSPHORUS: PHOSPHORUS: 4.5 mg/dL (ref 2.4–5.1)

## 2022-11-30 LAB — MAGNESIUM: MAGNESIUM: 1.9 mg/dL (ref 1.6–2.6)

## 2022-11-30 LAB — HEPATIC FUNCTION PANEL
ALBUMIN: 2.7 g/dL — ABNORMAL LOW (ref 3.4–5.0)
ALKALINE PHOSPHATASE: 167 U/L — ABNORMAL HIGH (ref 46–116)
ALT (SGPT): 35 U/L (ref 10–49)
AST (SGOT): 49 U/L — ABNORMAL HIGH (ref <=34)
BILIRUBIN DIRECT: 0.2 mg/dL (ref 0.00–0.30)
BILIRUBIN TOTAL: 0.4 mg/dL (ref 0.3–1.2)
PROTEIN TOTAL: 6.6 g/dL (ref 5.7–8.2)

## 2022-11-30 LAB — POTASSIUM: POTASSIUM: 4.6 mmol/L (ref 3.4–4.8)

## 2022-11-30 MED ADMIN — carvedilol (COREG) tablet 6.25 mg: 6.25 mg | ORAL | @ 01:00:00

## 2022-11-30 MED ADMIN — HYDROmorphone (DILAUDID) tablet 2 mg: 2 mg | ORAL | @ 19:00:00 | Stop: 2022-12-01

## 2022-11-30 MED ADMIN — HYDROmorphone (DILAUDID) tablet 2 mg: 2 mg | ORAL | @ 03:00:00 | Stop: 2022-12-01

## 2022-11-30 MED ADMIN — lidocaine 4 % patch 1 patch: 1 | TRANSDERMAL | @ 19:00:00

## 2022-11-30 MED ADMIN — insulin lispro (HumaLOG) injection 0-20 Units: 0-20 [IU] | SUBCUTANEOUS | @ 11:00:00

## 2022-11-30 MED ADMIN — tacrolimus (PROGRAF) capsule 1 mg: 1 mg | ORAL | @ 01:00:00

## 2022-11-30 MED ADMIN — insulin lispro (HumaLOG) injection 10 Units: 10 [IU] | SUBCUTANEOUS | @ 05:00:00 | Stop: 2022-11-30

## 2022-11-30 MED ADMIN — gentamicin-sodium citrate lock solution in NS: 1.4 mL | @ 16:00:00

## 2022-11-30 MED ADMIN — heparin (porcine) 5,000 unit/mL injection 7,500 Units: 7500 [IU] | SUBCUTANEOUS | @ 18:00:00

## 2022-11-30 MED ADMIN — heparin (porcine) 5,000 unit/mL injection 7,500 Units: 7500 [IU] | SUBCUTANEOUS | @ 09:00:00

## 2022-11-30 MED ADMIN — sodium ferric gluconate (FERRLECIT) 125 mg in sodium chloride (NS) 0.9 % 100 mL IVPB: 125 mg | INTRAVENOUS | @ 13:00:00 | Stop: 2022-12-15

## 2022-11-30 MED ADMIN — insulin lispro (HumaLOG) injection 35 Units: 35 [IU] | SUBCUTANEOUS | @ 01:00:00

## 2022-11-30 MED ADMIN — insulin lispro (HumaLOG) injection 0-20 Units: 0-20 [IU] | SUBCUTANEOUS | @ 23:00:00

## 2022-11-30 MED ADMIN — melatonin tablet 3 mg: 3 mg | ORAL | @ 03:00:00

## 2022-11-30 MED ADMIN — sevelamer (RENVELA) tablet 1,600 mg: 1600 mg | ORAL | @ 18:00:00

## 2022-11-30 MED ADMIN — mycophenolate (CELLCEPT) capsule 250 mg: 250 mg | ORAL | @ 01:00:00

## 2022-11-30 MED ADMIN — insulin glargine (LANTUS) injection 15 Units: 15 [IU] | SUBCUTANEOUS | @ 01:00:00

## 2022-11-30 MED ADMIN — hydrOXYzine (ATARAX) tablet 25 mg: 25 mg | ORAL | @ 03:00:00

## 2022-11-30 MED ADMIN — insulin lispro (HumaLOG) injection 35 Units: 35 [IU] | SUBCUTANEOUS | @ 23:00:00

## 2022-11-30 MED ADMIN — insulin lispro (HumaLOG) injection 0-20 Units: 0-20 [IU] | SUBCUTANEOUS | @ 01:00:00

## 2022-11-30 MED ADMIN — insulin lispro (HumaLOG) injection 0-20 Units: 0-20 [IU] | SUBCUTANEOUS | @ 18:00:00

## 2022-11-30 MED ADMIN — sevelamer (RENVELA) tablet 1,600 mg: 1600 mg | ORAL | @ 23:00:00

## 2022-11-30 MED ADMIN — insulin lispro (HumaLOG) injection 35 Units: 35 [IU] | SUBCUTANEOUS | @ 18:00:00

## 2022-11-30 MED ADMIN — heparin (porcine) 5,000 unit/mL injection 7,500 Units: 7500 [IU] | SUBCUTANEOUS | @ 01:00:00

## 2022-11-30 NOTE — Unmapped (Addendum)
[facility] Nephrology Hemodialysis Procedure Note     11/30/2022    Richard [name] was seen and examined on hemodialysis    CHIEF COMPLAINT: End Stage Renal Disease    INTERVAL HISTORY: Doing ok. Eager to get out of hospital . Got AV fistula -has minimal pain, a little hand tingling  DIALYSIS TREATMENT DATA:  Estimated Dry Weight (kg):  (tbd) Patient Goal Weight (kg): 1.5 kg (3 lb 4.9 oz)   Pre-Treatment Weight (kg): 137.5 kg (303 lb 2.1 oz)    Dialysis Bath  Bath: 3 K+ / 2.5 Ca+  Dialysate Na (mEq/L): 137 mEq/L  Dialysate HCO3 (mEq/L): 35 mEq/L Dialyzer: F-180 (98 mLs)   Blood Flow Rate (mL/min): 0 mL/min Dialysis Flow (mL/min): 800 mL/min   Machine Temperature (C): 36 ??C (96.8 ??F)      PHYSICAL EXAM:  Vitals:  Temp:  [36 ??C (96.8 ??F)-36.6 ??C (97.9 ??F)] 36.6 ??C (97.8 ??F)  Heart Rate:  [54-95] 71  SpO2 Pulse:  [54-74] 56  BP: (109-154)/(70-91) 148/72  MAP (mmHg):  [94-106] 94    General: healthy, obese, currently dialyzing in a Hemodialysis Recliner  Pulmonary: normal respiratory effort  Cardiovascular: regular rate and rhythm  Extremities: tr edema. R arm in sling, AVF site looks good, hand warm and pink  Access: Right IJ tunneled catheter - exit looks ok    LAB DATA:  Lab Results   Component Value Date    NA 136 11/30/2022    K 4.6 11/30/2022    K 4.6 11/30/2022    CL 99 11/30/2022    CO2 27.0 11/30/2022    BUN 39 (H) 11/30/2022    CREATININE 6.58 (H) 11/30/2022    GLUCOSE 196 03/03/2012    CALCIUM 8.6 (L) 11/30/2022    MG 1.9 11/30/2022    PHOS 4.5 11/30/2022    ALBUMIN 2.7 (L) 11/30/2022      Lab Results   Component Value Date    HCT 25.8 (L) 11/30/2022    WBC 12.0 (H) 11/30/2022        ASSESSMENT/PLAN:  End Stage Renal Disease on Intermittent Hemodialysis:  UF goal: 2.5L as tolerated  Adjust medications for a GFR <10  Avoid nephrotoxic agents  Last HD Treatment:Completed (11/28/22)     Bone Mineral Metabolism:  Lab Results   Component Value Date    CALCIUM 8.6 (L) 11/30/2022    CALCIUM 8.5 (L) 11/29/2022    Lab Results   Component Value Date    ALBUMIN 2.7 (L) 11/30/2022    ALBUMIN 2.5 (L) 11/29/2022      Lab Results   Component Value Date    PHOS 4.5 11/30/2022    PHOS 4.8 11/29/2022    Lab Results   Component Value Date    PTH 267.5 (H) 10/14/2021      Hypoalbuminemia. Hyperphosphatemia should improve with dialysis, also on sevelamer    Anemia:   Lab Results   Component Value Date    HGB 8.6 (L) 11/30/2022    HGB 9.1 (L) 11/29/2022    HGB 8.8 (L) 11/28/2022    Iron Saturation (%)   Date Value Ref Range Status   11/09/2022 36 % Final   05/15/2012 39 20 - 50 % Final      Lab Results   Component Value Date    FERRITIN 169.8 11/09/2022       I explained risk/benefits of erythropoietin, and the patient initially wished to hold on starting it -but then, after consulting his sister, asked  that we start it. Have ordered 3K unit/tx  Vascular Access:  Hopefully, will get tunneled line today. His AVF will require 2nd stage/superficialization,  Blood Flow Rate (mL/min): 0 mL/min    IV Antibiotics to be administered at discharge:  No  PT WOULD LIKE TO SPEAK WITH DIETITIAN ABOUT DIET AND FLUID RESTRICTION    This procedure was fully reviewed with the patient and/or their decision-maker. The risks, benefits, and alternatives were discussed prior to the procedure. All questions were answered and written informed consent was obtained.    Antonietta Barcelona, MD  [facility] Nephrology & Hypertension

## 2022-11-30 NOTE — Unmapped (Signed)
HD started. Alert and awake. Dr. Lissa Morales seen patient at dialysis. Pre v/s taken and recorded. Both arterial and venous catheter patent and used in HD, crit line monitoring on.    Problem: Hemodialysis  Goal: Safe, Effective Therapy Delivery  Outcome: Progressing  Goal: Effective Tissue Perfusion  Outcome: Progressing  Goal: Absence of Infection Signs and Symptoms  Outcome: Progressing

## 2022-11-30 NOTE — Unmapped (Signed)
HEMODIALYSIS NURSE PROCEDURE NOTE       Treatment Number:  5 Room / Station:  7    Procedure Date:  11/30/22 Device Name/Number: Shirlee Limerick    Total Dialysis Treatment Time:  244 Min.    CONSENT:    Written consent was obtained prior to the procedure and is detailed in the medical record.  Prior to the start of the procedure, a time out was taken and the identity of the patient was confirmed via name, medical record number and date of birth.     WEIGHT:  Hemodialysis Pre-Treatment Weights       Date/Time Pre-Treatment Weight (kg) Estimated Dry Weight (kg) Patient Goal Weight (kg) Total Goal Weight (kg)    11/30/22 0830 137.5 kg (303 lb 2.1 oz) -- 1.5 kg (3 lb 4.9 oz) 2.05 kg (4 lb 8.3 oz)           Active Dialysis Orders (168h ago, onward)       Start     Ordered    11/25/22 0919  Hemodialysis inpatient  Every Tue,Thu,Sat      Question Answer Comment   Patient HD Status: Chronic    New Start? Yes    K+ 3 meq/L    Ca++ 2.5 meq/L    Bicarb 35 meq/L    Na+ 137 meq/L    Na+ Modeling no    Dialyzer [id number]    Dialysate Temperature (C) 36    BFR-As tolerated to a maximum of: 400 mL/min see comments   DFR 800 mL/min see comments   Duration of treatment 4 Hr see comments   Dry weight (kg) TBD    Challenge dry weight (kg) yes    Fluid removal (L) 1.5L on 11/25/22    Tubing Adult = 142 ml    Access Site Dialysis Catheter    Access Site Location Right    Keep SBP >: 100        11/25/22 0918                  ASSESSMENT:  General appearance: alert  Neurologic: Grossly normal  Lungs: clear to auscultation bilaterally  Heart: regular rate and rhythm, S1, S2 normal, no murmur, click, rub or gallop  Abdomen: soft, non-tender; bowel sounds normal; no masses,  no organomegaly  Pulses: 2+ and symmetric.  Skin: Skin color, texture, turgor normal. No rashes or lesions    ACCESS SITE:          Hemodialysis Catheter With Distal Infusion Port 11/23/22 Right Internal jugular 1.4 mL 1.4 mL (Active)   Site Assessment Clean;Dry;Intact 11/30/22 1258 Proximal Lumen Status / Patency Blood Return - Brisk;Gentamicin Citrate Locked 11/30/22 1258   Proximal Lumen Intervention Deaccessed 11/30/22 1258   Medial Lumen Status / Patency Blood Return - Brisk;Gentamicin Citrate Locked 11/30/22 1258   Medial Lumen Intervention Deaccessed 11/30/22 1258   Lumen 3, Distal Status / Patency Blood Return - Brisk;Capped 11/29/22 2320   Distal Lumen Intervention Flushed 11/29/22 2320   Dressing Type CHG gel;Occlusive;Transparent 11/30/22 1258   Dressing Status      Clean;Dry;Intact/not removed 11/30/22 1258   Dressing Intervention No intervention needed 11/30/22 1258   Contraindicated due to: Dressing Intact surrounding insertion site 11/30/22 1258   Dressing Change Due 12/06/22 11/30/22 1258   Line Necessity Reviewed? Y 11/30/22 1258   Line Necessity Indications Yes - Hemodialysis 11/30/22 1258   Line Necessity Reviewed With Nephrologist 11/30/22 1258     Arteriovenous Fistula - Vein Graft  Access 11/29/22 1216 Right Forearm (Active)   Site Assessment Clean;Dry;Intact 11/29/22 2320   AV Fistula Thrill Present;Bruit Present 11/29/22 1200   Status Deaccessed 11/29/22 1200   Dressing Intervention New dressing 11/29/22 1200   Dressing Gauze;Occlusive 11/29/22 1200     Catheter fill volumes:    Arterial: 1.4 mL Venous: 1.4 mL     Patient Lines/Drains/Airways Status       Active Peripheral & Central Intravenous Access       Name Placement date Placement time Site Days    Hemodialysis Catheter With Distal Infusion Port 11/23/22 Right Internal jugular 1.4 mL 1.4 mL 11/23/22  1324  Internal jugular  6                   LAB RESULTS:  Lab Results   Component Value Date    NA 136 11/30/2022    K 4.6 11/30/2022    K 4.6 11/30/2022    CL 99 11/30/2022    CO2 27.0 11/30/2022    BUN 39 (H) 11/30/2022    CREATININE 6.58 (H) 11/30/2022    GLU 259 (H) 11/30/2022    GLUF 138 10/06/2014    CALCIUM 8.6 (L) 11/30/2022    CAION 4.57 11/20/2022    ICAV 4.88 03/04/2012    PHOS 4.5 11/30/2022    MG 1.9 11/30/2022    PTH 267.5 (H) 10/14/2021    IRON 95 11/09/2022    LABIRON 36 11/09/2022    TRANSFERRIN 212.0 (L) 11/09/2022    FERRITIN 169.8 11/09/2022    TIBC 267.1 11/09/2022     Lab Results   Component Value Date    WBC 12.0 (H) 11/30/2022    HGB 8.6 (L) 11/30/2022    HCT 25.8 (L) 11/30/2022    PLT 157 11/30/2022    PHART 7.36 03/04/2012    PO2ART 88 03/04/2012    PCO2ART 43 03/04/2012    HCO3ART 23.4 03/04/2012    BEART -1.3 03/04/2012    O2SATART 98.1 03/04/2012    PTINR : 05/06/2012    APTT 26.1 04/24/2022        VITAL SIGNS:   Temperature       Date/Time Temp Temp src       11/30/22 1257 36.6 ??C (97.8 ??F) Oral     11/30/22 1245 36.6 ??C (97.8 ??F) Oral           Hemodynamics       Date/Time Pulse BP MAP (mmHg) Patient Position    11/30/22 1257 66 144/77 102 Lying    11/30/22 1245 66 122/80 -- Lying    11/30/22 1230 -- -- -- Lying    11/30/22 1145 71 142/84 -- Sitting    11/30/22 1115 66 145/89 -- Sitting    11/30/22 1045 67 128/71 -- Sitting    11/30/22 1015 67 142/78 -- Sitting    11/30/22 0945 67 141/68 -- Sitting    11/30/22 0930 67 149/78 -- Sitting    11/30/22 0915 70 127/68 -- --          Blood Volume Monitor       Date/Time Blood Volume Change (%) HCT HGB Critline O2 SAT %    11/30/22 1245 -5.3 % 28.6 9.7 71.4    11/30/22 1145 -4.2 % 28.2 9.6 71.3    11/30/22 1115 -3.6 % 28.1 9.6 64.2    11/30/22 1045 -3.7 % 28.1 9.6 68.8    11/30/22 1015 -4 % 28.2 9.6 70.2    11/30/22 0945 -4.7 %  28.4 9.7 69    11/30/22 0930 -5.4 % 28.6 9.7 66.9    11/30/22 0915 -5 % 28.5 9.7 68.8          Oxygen Therapy       Date/Time Resp SpO2 O2 Device FiO2 (%) O2 Flow Rate (L/min)    11/30/22 1257 18 -- None (Room air) -- --    11/30/22 1245 20 -- None (Room air) -- --    11/30/22 1230 20 -- None (Room air) -- --    11/30/22 1145 18 -- None (Room air) -- --    11/30/22 1115 20 -- None (Room air) -- --    11/30/22 1045 18 -- None (Room air) -- --    11/30/22 1015 20 -- None (Room air) -- --    11/30/22 0945 18 -- None (Room air) -- --    11/30/22 0930 18 -- None (Room air) -- --    11/30/22 0915 20 -- None (Room air) -- --            Pre-Hemodialysis Assessment       Date/Time Therapy Number Dialyzer Hemodialysis Line Type All Machine Alarms Passed    11/30/22 0830 5 F-180 (98 mLs) Adult (142 m/s) Yes      Date/Time Air Detector Saline Line Double Clampled Hemo-Safe Applied Dialysis Flow (mL/min)    11/30/22 0830 Engaged -- -- 800 mL/min      Date/Time Verify Priming Solution Priming Volume Hemodialysis Independent pH Hemodialysis Machine Conductivity (mS/cm)    11/30/22 0830 0.9% NS 300 mL -- 13.7 mS/cm      Date/Time Hemodialysis Independent Conductivity (mS/cm) Bicarb Conductivity Residual Bleach Negative Total Chlorine    11/30/22 0830 13.8 mS/cm -- Yes 0            Hemodialysis Treatment       Date/Time Blood Flow Rate (mL/min) Arterial Pressure (mmHg) Venous Pressure (mmHg) Transmembrane Pressure (mmHg)    11/30/22 1245 400 mL/min -141 mmHg 137 mmHg 26 mmHg    11/30/22 1145 400 mL/min -142 mmHg 129 mmHg 31 mmHg    11/30/22 1115 400 mL/min -142 mmHg 129 mmHg 35 mmHg    11/30/22 1045 400 mL/min -142 mmHg 132 mmHg 36 mmHg    11/30/22 1015 400 mL/min -144 mmHg 130 mmHg 37 mmHg    11/30/22 0945 400 mL/min -139 mmHg 136 mmHg 31 mmHg    11/30/22 0930 400 mL/min -138 mmHg 134 mmHg 34 mmHg    11/30/22 0915 400 mL/min -135 mmHg 136 mmHg 32 mmHg    11/30/22 0845 400 mL/min -123 mmHg 124 mmHg 40 mmHg    11/30/22 0841 0 mL/min 62 mmHg 31 mmHg 33 mmHg      Date/Time Ultrafiltration Rate (mL/hr) Ultrafiltrate Removed (mL) Dialysate Flow Rate (mL/min) KECN (Kecn)    11/30/22 1245 520 mL/hr 2039 mL 800 ml/min --    11/30/22 1145 520 mL/hr 1524 mL 800 ml/min --    11/30/22 1115 520 mL/hr 1268 mL 800 ml/min --    11/30/22 1045 510 mL/hr 1026 mL 800 ml/min --    11/30/22 1015 510 mL/hr 773 mL 800 ml/min --    11/30/22 0945 510 mL/hr 521 mL 800 ml/min --    11/30/22 0930 510 mL/hr 395 mL 800 ml/min 286 Kecn    11/30/22 0915 510 mL/hr 270 mL 800 ml/min -- 11/30/22 0845 510 mL/hr 20 mL 800 ml/min --    11/30/22 0841 0 mL/hr 100 mL 800 ml/min --  Hemodialysis Treatment Comments       Date/Time Intra-Hemodialysis Comments    11/30/22 1245 HD ended. Alert, awake, stable    11/30/22 1230 stable    11/30/22 1145 asleep, NAD    11/30/22 1115 tolerating tx    11/30/22 1045 asleep, NAD    11/30/22 1015 pt. tolerating tx    11/30/22 0945 pt. stable pt. watching tv    11/30/22 0930 pt. stable pt. watching tv    11/30/22 0915 Alert, stable    11/30/22 0845 awake, using phone    11/30/22 0841 HD started. Alert and stable. Dr. Lissa Morales at bedside rounding.          Post Treatment       Date/Time Rinseback Volume (mL) On Line Clearance: spKt/V Total Liters Processed (L/min) Dialyzer Clearance    11/30/22 1200 300 mL -- 93.4 L/min Moderately streaked          Post Hemodialysis Treatment Comments       Date/Time Post-Hemodialysis Comments    11/30/22 1200 Patient tolerated well, stable          Hemodialysis I/O       Date/Time Total Hemodialysis Replacement Volume (mL) Total Ultrafiltrate Output (mL)    11/30/22 1200 -- 1500 mL            [id number] - Medicaitons Given During Treatment  (last 4 hrs)           Oberia Beaudoin, Lelan Pons D, RN         Medication Name Action Time Action Route Rate Dose User     gentamicin-sodium citrate lock solution in NS 11/30/22 1202 Given Intra-cannular  1.4 mL Shawnika Pepin, Annamarie Major, RN     gentamicin-sodium citrate lock solution in NS 11/30/22 1201 Given Intra-cannular  1.4 mL Durrell Barajas, Annamarie Major, RN     sodium ferric gluconate (FERRLECIT) 125 mg in sodium chloride (NS) 0.9 % 100 mL IVPB 11/30/22 0923 New Bag Intravenous 120 mL/hr 125 mg Harriett Azar, Annamarie Major, RN     sodium ferric gluconate (FERRLECIT) 125 mg in sodium chloride (NS) 0.9 % 100 mL IVPB 11/30/22 0932 Stopped Intravenous   Alexsis Branscom, Lelan Pons D, RN            LITTLE, AMY K, RN         Medication Name Action Time Action Route Rate Dose User     amlodipine (NORVASC) tablet 10 mg 11/30/22 0900 Not Given Oral  10 mg Little, Amy K, RN     carvedilol (COREG) tablet 6.25 mg 11/30/22 0900 Not Given Oral  6.25 mg Little, Amy K, RN     fluticasone propionate (FLONASE) 50 mcg/actuation nasal spray 1 spray 11/30/22 0900 Not Given Each Nare  1 spray Little, Amy K, RN     insulin NPH (HumuLIN,NovoLIN) injection 3 Units 11/30/22 0900 Not Given Subcutaneous  3 Units Little, Amy K, RN     lidocaine 4 % patch 1 patch 11/30/22 0900 Not Given Transdermal  1 patch Little, Amy K, RN     mycophenolate (CELLCEPT) capsule 250 mg 11/30/22 0900 Not Given Oral  250 mg Little, Amy K, RN     pantoprazole (Protonix) EC tablet 40 mg 11/30/22 0900 Not Given Oral  40 mg Little, Amy K, RN     polyethylene glycol (MIRALAX) packet 17 g 11/30/22 0900 Not Given Oral  17 g Little, Amy K, RN     spironolactone (ALDACTONE) tablet 25 mg 11/30/22 0900 Not Given Oral  25 mg Little, Amy K, RN                      Patient tolerated treatment in a  Dialysis Recliner.

## 2022-11-30 NOTE — Unmapped (Shared)
Assessment/Plan:    [name] is a 57 y.o. male who will undergo LEFT tunneled HD catheter placement in Interventional Radiology.    --This procedure has been fully reviewed with the patient/patient???s authorized representative. The risks, benefits and alternatives have been explained, and the patient/patient???s authorized representative has consented to the procedure.  --The patient will accept blood products in an emergent situation.  --The patient does not have a Do Not Resuscitate order in effect.    HPI: [name] is 57yo male with PMH NASH cirrhosis s/p OLT 2013, HFpEF, CKD5, persistent A-fib not on AC, CAD, HTN, OSA not on CPAP who presented to [facility] 5/22 with RLE cellulitis, currently in a prolonged admission due to AKI on CKD, elevated LFTs, and O2 requirement iso volume overload. Patient has now progressed to ESRD and will be starting HD. Right UE brachiocephalic fistula created 11/29/2022. Prior LIJ CVC and current non tunneled RIJ TLC      Allergies: No Known Allergies    Medications:  No relevant medications, please see full medication list in Epic.      PSH:   Past Surgical History:   Procedure Laterality Date    liver transpant      LIVER TRANSPLANTATION  2013    PR COLONOSCOPY W/BIOPSY SINGLE/MULTIPLE N/A 08/13/2018    Procedure: COLONOSCOPY, FLEXIBLE, PROXIMAL TO SPLENIC FLEXURE; WITH BIOPSY, SINGLE OR MULTIPLE;  Surgeon: Neysa Hotter, MD;  [facility];  Service: Gastroenterology    PR COLSC FLX W/RMVL OF TUMOR POLYP LESION SNARE TQ N/A 08/13/2018    Procedure: COLONOSCOPY FLEX; W/REMOV TUMOR/LES BY SNARE;  Surgeon: Neysa Hotter, MD;  [facility];  Service: Gastroenterology    PR UPPER GI ENDOSCOPY,BIOPSY N/A 07/13/2015    Procedure: UGI ENDOSCOPY; WITH BIOPSY, SINGLE OR MULTIPLE;  Surgeon: Joaquin Music, MD;  [facility]: [facility];  Service: Gastroenterology       PMH:   Past Medical History:   Diagnosis Date COVID-19 07/18/2019    Family history of malignant neoplasm of prostate 06/23/2015    Hepatic cirrhosis (CMS-HCC) 06/23/2015    Overview:  Secondary to NASH; followed by Dr. Yevonne Pax, GI.  Last Assessment & Plan:  Relevant Hx: Course: Daily Update: Today's Plan:     Hypertension     Hypogonadism in male 02/12/2014    Liver cirrhosis secondary to NASH (nonalcoholic steatohepatitis) (CMS-HCC)     s/p liver transplant 2013       PE:    Vitals:    11/30/22 0600   BP: 150/80   Pulse: 90   Resp: 18   Temp:    SpO2: 96%     General: WD, WN male in NAD.   HEENT: Normocephalic, atraumatic.   Lungs: Respirations nonlabored***      Rolin Barry, MD, MD  11/30/2022, 8:02 AM

## 2022-11-30 NOTE — Unmapped (Signed)
Addendum  created 11/30/22 0856 by Kathryne Gin, MD    Delete clinical note

## 2022-11-30 NOTE — Unmapped (Signed)
Pt is alert and oriented X4. Pt went off unit today around 0800 for AV fistula surgery to [facility]. Pt arrived back to unit around 1400. Pt complained of severe pain throughout the body. MD team was consulted and pain interventions were provided for pt including medication intervention and therapeutic measures. All safety measures and comfort measures were provided for pt for the shift.     Problem: Adult Inpatient Plan of Care  Goal: Plan of Care Review  Outcome: Progressing  Goal: Patient-Specific Goal (Individualized)  Outcome: Progressing  Goal: Absence of Hospital-Acquired Illness or Injury  Outcome: Progressing  Intervention: Identify and Manage Fall Risk  Recent Flowsheet Documentation  Taken 11/29/2022 0715 by Wille Glaser, RN  Safety Interventions: nonskid shoes/slippers when out of bed  Intervention: Prevent Infection  Recent Flowsheet Documentation  Taken 11/29/2022 0715 by Wille Glaser, RN  Infection Prevention: rest/sleep promoted  Goal: Optimal Comfort and Wellbeing  Outcome: Progressing  Goal: Readiness for Transition of Care  Outcome: Progressing  Goal: Rounds/Family Conference  Outcome: Progressing     Problem: Fall Injury Risk  Goal: Absence of Fall and Fall-Related Injury  Outcome: Progressing  Intervention: Promote Injury-Free Environment  Recent Flowsheet Documentation  Taken 11/29/2022 0715 by Wille Glaser, RN  Safety Interventions: nonskid shoes/slippers when out of bed     Problem: Pain Acute  Goal: Optimal Pain Control and Function  Outcome: Progressing     Problem: Hemodialysis  Goal: Safe, Effective Therapy Delivery  Outcome: Progressing  Goal: Effective Tissue Perfusion  Outcome: Progressing  Goal: Absence of Infection Signs and Symptoms  Outcome: Progressing  Intervention: Prevent or Manage Infection  Recent Flowsheet Documentation  Taken 11/29/2022 0715 by Wille Glaser, RN  Infection Management: aseptic technique maintained  Infection Prevention: rest/sleep promoted     Problem: Wound  Goal: Optimal Coping  Outcome: Progressing  Goal: Optimal Functional Ability  Outcome: Progressing  Intervention: Optimize Functional Ability  Recent Flowsheet Documentation  Taken 11/29/2022 0715 by Wille Glaser, RN  Activity Management: standing at bedside  Goal: Absence of Infection Signs and Symptoms  Outcome: Progressing  Intervention: Prevent or Manage Infection  Recent Flowsheet Documentation  Taken 11/29/2022 0715 by Wille Glaser, RN  Infection Management: aseptic technique maintained  Goal: Improved Oral Intake  Outcome: Progressing  Goal: Optimal Pain Control and Function  Outcome: Progressing  Goal: Skin Health and Integrity  Outcome: Progressing  Intervention: Optimize Skin Protection  Recent Flowsheet Documentation  Taken 11/29/2022 0715 by Wille Glaser, RN  Activity Management: standing at bedside  Goal: Optimal Wound Healing  Outcome: Progressing

## 2022-11-30 NOTE — Unmapped (Addendum)
Pt A&Ox4, VSS, overnight. NPO at this time for possible tunneled line placement this morning. HD scheduled for 7 am. Blood glucose elevated overnight, pt received scheduled coverage with additional dose after recheck per provider on call. Pt frequently requesting snacks prior to midnight despite elevated blood sugar, educated pt.     Problem: Adult Inpatient Plan of Care  Goal: Plan of Care Review  11/30/2022 0403 by Jackelyn Knife, RN  Outcome: Ongoing - Unchanged  11/30/2022 0357 by Jackelyn Knife, RN  Outcome: Ongoing - Unchanged  Goal: Patient-Specific Goal (Individualized)  Outcome: Ongoing - Unchanged  Goal: Absence of Hospital-Acquired Illness or Injury  Outcome: Ongoing - Unchanged  Intervention: Identify and Manage Fall Risk  Recent Flowsheet Documentation  Taken 11/30/2022 0200 by Jackelyn Knife, RN  Safety Interventions:   fall reduction program maintained   lighting adjusted for tasks/safety   low bed   nonskid shoes/slippers when out of bed  Taken 11/30/2022 0000 by Jackelyn Knife, RN  Safety Interventions:   fall reduction program maintained   environmental modification   lighting adjusted for tasks/safety   low bed  Taken 11/29/2022 2200 by Jackelyn Knife, RN  Safety Interventions:   fall reduction program maintained   lighting adjusted for tasks/safety   low bed   nonskid shoes/slippers when out of bed  Taken 11/29/2022 2000 by Jackelyn Knife, RN  Safety Interventions:   fall reduction program maintained   low bed   lighting adjusted for tasks/safety  Intervention: Prevent Skin Injury  Recent Flowsheet Documentation  Taken 11/30/2022 0000 by Jackelyn Knife, RN  Positioning for Skin: Supine/Back  Taken 11/29/2022 2200 by Jackelyn Knife, RN  Positioning for Skin: Supine/Back  Taken 11/29/2022 2000 by Jackelyn Knife, RN  Positioning for Skin: Supine/Back  Intervention: Prevent and Manage VTE (Venous Thromboembolism) Risk  Recent Flowsheet Documentation  Taken 11/30/2022 0200 by Jackelyn Knife, RN  Anti-Embolism Intervention: (heparin) Other (Comment)  Taken 11/30/2022 0000 by Jackelyn Knife, RN  Anti-Embolism Intervention: (heparin) Other (Comment)  Taken 11/29/2022 2200 by Jackelyn Knife, RN  Anti-Embolism Intervention: (heparin) Other (Comment)  Taken 11/29/2022 2000 by Jackelyn Knife, RN  Anti-Embolism Intervention: (heparin) Other (Comment)  Goal: Optimal Comfort and Wellbeing  11/30/2022 0403 by Jackelyn Knife, RN  Outcome: Ongoing - Unchanged  11/30/2022 0357 by Jackelyn Knife, RN  Outcome: Ongoing - Unchanged     Problem: Fall Injury Risk  Goal: Absence of Fall and Fall-Related Injury  11/30/2022 0403 by Jackelyn Knife, RN  Outcome: Ongoing - Unchanged  11/30/2022 0357 by Jackelyn Knife, RN  Outcome: Ongoing - Unchanged  Intervention: Promote Injury-Free Environment  Recent Flowsheet Documentation  Taken 11/30/2022 0200 by Jackelyn Knife, RN  Safety Interventions:   fall reduction program maintained   lighting adjusted for tasks/safety   low bed   nonskid shoes/slippers when out of bed  Taken 11/30/2022 0000 by Jackelyn Knife, RN  Safety Interventions:   fall reduction program maintained   environmental modification   lighting adjusted for tasks/safety   low bed  Taken 11/29/2022 2200 by Jackelyn Knife, RN  Safety Interventions:   fall reduction program maintained   lighting adjusted for tasks/safety   low bed   nonskid shoes/slippers when out of bed  Taken 11/29/2022 2000 by Jackelyn Knife, RN  Safety Interventions:   fall reduction program maintained   low bed   lighting adjusted for tasks/safety     Problem: Pain Acute  Goal: Optimal Pain Control and Function  11/30/2022 0403 by Jackelyn Knife, RN  Outcome: Ongoing -  Unchanged  11/30/2022 0357 by Jackelyn Knife, RN  Outcome: Ongoing - Unchanged     Problem: Hemodialysis  Goal: Absence of Infection Signs and Symptoms  Intervention: Prevent or Manage Infection  Recent Flowsheet Documentation  Taken 11/29/2022 2000 by Jackelyn Knife, RN  Infection Management: aseptic technique maintained     Problem: Wound  Goal: Optimal Functional Ability  Intervention: Optimize Functional Ability  Recent Flowsheet Documentation  Taken 11/30/2022 0200 by Jackelyn Knife, RN  Activity Management: bedrest  Taken 11/30/2022 0000 by Jackelyn Knife, RN  Activity Management: bedrest  Taken 11/29/2022 2200 by Jackelyn Knife, RN  Activity Management: bedrest  Taken 11/29/2022 2000 by Jackelyn Knife, RN  Activity Management: sitting, edge of bed  Goal: Absence of Infection Signs and Symptoms  Intervention: Prevent or Manage Infection  Recent Flowsheet Documentation  Taken 11/29/2022 2000 by Jackelyn Knife, RN  Infection Management: aseptic technique maintained  Goal: Skin Health and Integrity  Intervention: Optimize Skin Protection  Recent Flowsheet Documentation  Taken 11/30/2022 0200 by Jackelyn Knife, RN  Activity Management: bedrest  Pressure Reduction Techniques: frequent weight shift encouraged  Taken 11/30/2022 0000 by Jackelyn Knife, RN  Activity Management: bedrest  Pressure Reduction Techniques: frequent weight shift encouraged  Taken 11/29/2022 2320 by Jackelyn Knife, RN  Pressure Reduction Techniques: frequent weight shift encouraged  Taken 11/29/2022 2200 by Jackelyn Knife, RN  Activity Management: bedrest  Taken 11/29/2022 2000 by Jackelyn Knife, RN  Activity Management: sitting, edge of bed  Pressure Reduction Techniques: frequent weight shift encouraged

## 2022-11-30 NOTE — Unmapped (Signed)
Internal Medicine (MEDU) Progress Note    Assessment & Plan:   Richard [name] is a 57 y.o. male with PMH NASH cirrhosis s/p OLT 2013, HFpEF, CKD5, persistent A-fib not on AC, CAD, HTN, OSA not on CPAP who presented to [facility] 5/22 with RLE cellulitis, currently in a prolonged admission due to AKI on CKD, elevated LFTs, and O2 requirement iso volume overload, now with progression to ESRD and tolerating HD. Awaiting perm HD line and HD chair.     Principal Problem:    ESRD (end stage renal disease) on dialysis (CMS-HCC)  Active Problems:    Hypertension    Liver replaced by transplant (CMS-HCC)    Immunosuppression due to drug therapy (CMS-HCC)    (HFpEF) heart failure with preserved ejection fraction (CMS-HCC)    Severe hyperglycemia due to diabetes mellitus (CMS-HCC)    Cellulitis of right lower extremity    Acute kidney injury superimposed on chronic kidney disease (CMS-HCC)    CAD (coronary artery disease)    Elevated liver enzymes    Hyperphosphatemia  Resolved Problems:    Hypomagnesemia    Hypokalemia    Active Problems    ESRD - Hyperphosphatemia, improving   Has been tolerating recent sessions of dialysis, will obtain more permanent access with tunneled line planned 6/7.  Volume status now improved.  BUN  and phosphorous levels with significant improvements.  -CM working on dialysis chair placement.   - Nephro following, appreciate recs  - Tunneled Line 6/7 NPO at midnight   - PO sevelamer 1600 mg TID  - Hold Spironolactone, Lasix  - Daily BMP, Mg, Phos  - AV fistula created at [facility] on 6/5    T2DM complicated by hyperglycemia  Titrating based on glucose levels  -Increase Lantus from 15 to 18 units nightly  - Humalog 35u at mealtimes (TID) (home 55u)  - SSI  - POCT BG QID    Elevated LFTs, slowly improving - s/p Liver Transplant in 2013  LFTs elevated on admission, have improved similar to prior baseline.  Suspect element of congestive hepatopathy.  MRCP considered but requires general anesthesia and deferred.  - Continue home Cellcept, Tacrolimus (goal 2-4, dosing per pharmacy)  - outpatient MRCP  - Ok to dose Tylenol <4g per day  - Daily HFP    Intermittent Oxygen Requirement - OSA, not on CPAP - Pulmonary Edema  Patient previously with pulmonary edema in the setting of volume overload from kidney disease and heart failure, significant volume removed via multiple sessions of dialysis and patient weaned to room air.  Will intermittently be placed on oxygen overnight for subjective shortness of breath.  Patient likely has OSA, discussed CPAP  If oxygen requirement persist despite CPAP use, consider repeat chest x-ray and RPP to rule out viral infection.  - CPAP at night if willing  - Outpatient sleep study (order was put in on 11/16/2022)    RLE Cellulitis, resolved - R Leg Pain  Patient with multiple episodes of cellulitis presented with right lower extremity cellulitis. Cellulitis has resolved with abx course as below.  Patient with pain in right leg, with right leg mildly larger than left.  PVLs without clot.  - Bcx 5/22: NGTD  - Bcx 5/24: NGTD  - Antibiotics:    - s/p Clindamycin (5/22)  - s/p Vancomycin (5/23)   - s/p Doxycycline (5/24 - 5/25)   - s/p Ceftriaxone (5/25)   - s/p Cefepime (5/25 - 5/28)   - s/p Linezolid (5/25 -  5/31)   - s/p Ciprofloxacin (5/28 - 5/31)  - Daily CBC  - PO dilaudid PRN    Anemia of chronic disease  No apparent blood loss, no bright red blood per rectum or melena or hematuria. Has been hemodynamically stable.  -Epo to be set up with HD as outpatient  -Daily CBC  -Monitor for bleeding    Chronic Conditions  Hypertension: Continue amlodipine 10 mg, Coreg 6.25 mg twice daily.   HFpEF - Hx A-Fib - CAD: hold spironolactone iso worsening renal function, continue carvedilol    Daily Checklist:  Diet: Regular Diet  DVT PPx:  heparin SQ  Electrolytes:  K > 3.6 Mg >2 goal  caution with repletion given ESRD  Code Status: Full Code  Dispo: Home pending tunneled line placement with VIR and HD chair     Team Contact Information:   Primary Team: Internal Medicine (MEDU)  Primary Resident: [name], MD  Resident's Pager: [phone] (Gen MedU Intern - Alvester Morin)    Interval History:   No adverse events overnight.  Seen in dialysis, pain better controlled than prior day.  Working on HD line with plan for tomorrow.    All other systems were reviewed and are negative except as noted in the HPI    Objective:   Temp:  [36.3 ??C (97.3 ??F)-37.7 ??C (99.9 ??F)] 37.7 ??C (99.9 ??F)  Heart Rate:  [62-95] 65  Resp:  [18-20] 18  BP: (122-152)/(64-89) 126/64  SpO2:  [93 %-98 %] 98 %    Gen:  NAD, converses normally. Laying in bed  HENT:  Atraumatic, normocephalic.  Heart:  RRR, without murmurs  Lungs: Normal work of breathing on room air  Extremities: Prior site of cellulitis without any erythema  Neuro: Symmetric movements, oriented and conversational

## 2022-12-01 LAB — BASIC METABOLIC PANEL
ANION GAP: 7 mmol/L (ref 5–14)
BLOOD UREA NITROGEN: 25 mg/dL — ABNORMAL HIGH (ref 9–23)
BUN / CREAT RATIO: 5
CALCIUM: 8.3 mg/dL — ABNORMAL LOW (ref 8.7–10.4)
CHLORIDE: 102 mmol/L (ref 98–107)
CO2: 32 mmol/L — ABNORMAL HIGH (ref 20.0–31.0)
CREATININE: 4.83 mg/dL — ABNORMAL HIGH
EGFR CKD-EPI (2021) MALE: 14 mL/min/{1.73_m2} — ABNORMAL LOW (ref >=60)
GLUCOSE RANDOM: 170 mg/dL (ref 70–179)
POTASSIUM: 4.2 mmol/L (ref 3.4–4.8)
SODIUM: 141 mmol/L (ref 135–145)

## 2022-12-01 LAB — TACROLIMUS LEVEL, TROUGH: TACROLIMUS, TROUGH: 3 ng/mL — ABNORMAL LOW (ref 5.0–15.0)

## 2022-12-01 LAB — CBC
HEMATOCRIT: 25.3 % — ABNORMAL LOW (ref 39.0–48.0)
HEMOGLOBIN: 8.5 g/dL — ABNORMAL LOW (ref 12.9–16.5)
MEAN CORPUSCULAR HEMOGLOBIN CONC: 33.6 g/dL (ref 32.0–36.0)
MEAN CORPUSCULAR HEMOGLOBIN: 28.4 pg (ref 25.9–32.4)
MEAN CORPUSCULAR VOLUME: 84.4 fL (ref 77.6–95.7)
MEAN PLATELET VOLUME: 9.2 fL (ref 6.8–10.7)
PLATELET COUNT: 199 10*9/L (ref 150–450)
RED BLOOD CELL COUNT: 3 10*12/L — ABNORMAL LOW (ref 4.26–5.60)
RED CELL DISTRIBUTION WIDTH: 13.7 % (ref 12.2–15.2)
WBC ADJUSTED: 13.6 10*9/L — ABNORMAL HIGH (ref 3.6–11.2)

## 2022-12-01 LAB — HEPATIC FUNCTION PANEL
ALBUMIN: 2.7 g/dL — ABNORMAL LOW (ref 3.4–5.0)
ALKALINE PHOSPHATASE: 164 U/L — ABNORMAL HIGH (ref 46–116)
ALT (SGPT): 17 U/L (ref 10–49)
AST (SGOT): 32 U/L (ref <=34)
BILIRUBIN DIRECT: 0.2 mg/dL (ref 0.00–0.30)
BILIRUBIN TOTAL: 0.4 mg/dL (ref 0.3–1.2)
PROTEIN TOTAL: 6.4 g/dL (ref 5.7–8.2)

## 2022-12-01 LAB — PHOSPHORUS: PHOSPHORUS: 5.8 mg/dL — ABNORMAL HIGH (ref 2.4–5.1)

## 2022-12-01 LAB — MAGNESIUM: MAGNESIUM: 1.9 mg/dL (ref 1.6–2.6)

## 2022-12-01 MED ADMIN — heparin (porcine) 5,000 unit/mL injection 7,500 Units: 7500 [IU] | SUBCUTANEOUS | @ 20:00:00

## 2022-12-01 MED ADMIN — sevelamer (RENVELA) tablet 1,600 mg: 1600 mg | ORAL | @ 20:00:00

## 2022-12-01 MED ADMIN — insulin lispro (HumaLOG) injection 0-20 Units: 0-20 [IU] | SUBCUTANEOUS | @ 21:00:00

## 2022-12-01 MED ADMIN — insulin lispro (HumaLOG) injection 0-20 Units: 0-20 [IU] | SUBCUTANEOUS | @ 16:00:00

## 2022-12-01 MED ADMIN — insulin lispro (HumaLOG) injection 0-20 Units: 0-20 [IU] | SUBCUTANEOUS | @ 12:00:00

## 2022-12-01 MED ADMIN — insulin lispro (HumaLOG) injection 35 Units: 35 [IU] | SUBCUTANEOUS | @ 21:00:00

## 2022-12-01 MED ADMIN — mycophenolate (CELLCEPT) capsule 250 mg: 250 mg | ORAL | @ 01:00:00

## 2022-12-01 MED ADMIN — tacrolimus (PROGRAF) capsule 1 mg: 1 mg | ORAL | @ 01:00:00

## 2022-12-01 MED ADMIN — carvedilol (COREG) tablet 6.25 mg: 6.25 mg | ORAL | @ 12:00:00

## 2022-12-01 MED ADMIN — HYDROmorphone (DILAUDID) tablet 2 mg: 2 mg | ORAL | @ 03:00:00 | Stop: 2022-12-01

## 2022-12-01 MED ADMIN — pantoprazole (Protonix) EC tablet 40 mg: 40 mg | ORAL | @ 12:00:00

## 2022-12-01 MED ADMIN — lidocaine 4 % patch 1 patch: 1 | TRANSDERMAL | @ 12:00:00

## 2022-12-01 MED ADMIN — carvedilol (COREG) tablet 6.25 mg: 6.25 mg | ORAL | @ 01:00:00

## 2022-12-01 MED ADMIN — insulin glargine (LANTUS) injection 18 Units: 18 [IU] | SUBCUTANEOUS | @ 04:00:00

## 2022-12-01 MED ADMIN — heparin (porcine) 5,000 unit/mL injection 7,500 Units: 7500 [IU] | SUBCUTANEOUS | @ 11:00:00

## 2022-12-01 MED ADMIN — mycophenolate (CELLCEPT) capsule 250 mg: 250 mg | ORAL | @ 12:00:00

## 2022-12-01 MED ADMIN — HYDROmorphone (DILAUDID) tablet 2 mg: 2 mg | ORAL | @ 11:00:00 | Stop: 2022-12-03

## 2022-12-01 MED ADMIN — amlodipine (NORVASC) tablet 10 mg: 10 mg | ORAL | @ 12:00:00

## 2022-12-01 MED ADMIN — spironolactone (ALDACTONE) tablet 25 mg: 25 mg | ORAL | @ 12:00:00

## 2022-12-01 MED ADMIN — tacrolimus (PROGRAF) capsule 1 mg: 1 mg | ORAL | @ 12:00:00

## 2022-12-01 MED ADMIN — heparin (porcine) 5,000 unit/mL injection 7,500 Units: 7500 [IU] | SUBCUTANEOUS | @ 03:00:00

## 2022-12-01 MED ADMIN — insulin lispro (HumaLOG) injection 0-20 Units: 0-20 [IU] | SUBCUTANEOUS | @ 04:00:00

## 2022-12-01 NOTE — Unmapped (Signed)
Alert and oriented x4. Breathing normal easy and regular on room air.  Medicated with pain reliever with fair effect. Independent in the room. Call bell and phone kept in reach. Bed kept at lowest position and brakes kept locked. NPO since midnight. Slept fairly well. Will monitor.     Problem: Adult Inpatient Plan of Care  Goal: Plan of Care Review  Outcome: Progressing  Flowsheets (Taken 12/01/2022 9629)  Progress: improving  Plan of Care Reviewed With: patient  Goal: Patient-Specific Goal (Individualized)  Outcome: Progressing  Flowsheets (Taken 12/01/2022 5284)  Individualized Care Needs: No falls thru end of shift.  Anxieties, Fears or Concerns: Pain will be tolerable thru end of shift.  Goal: Absence of Hospital-Acquired Illness or Injury  Outcome: Progressing  Intervention: Identify and Manage Fall Risk  Recent Flowsheet Documentation  Taken 12/01/2022 0000 by Lennox Grumbles, RN  Safety Interventions:   environmental modification   fall reduction program maintained  Intervention: Prevent Skin Injury  Recent Flowsheet Documentation  Taken 12/01/2022 0400 by Lennox Grumbles, RN  Positioning for Skin: Left  Taken 12/01/2022 0200 by Lennox Grumbles, RN  Positioning for Skin: Supine/Back  Taken 12/01/2022 0000 by Lennox Grumbles, RN  Positioning for Skin: Supine/Back  Intervention: Prevent and Manage VTE (Venous Thromboembolism) Risk  Recent Flowsheet Documentation  Taken 12/01/2022 0400 by Lennox Grumbles, RN  Anti-Embolism Intervention: (heparin) Other (Comment)  Taken 12/01/2022 0200 by Lennox Grumbles, RN  Anti-Embolism Intervention: (heparin) Other (Comment)  Taken 12/01/2022 0000 by Lennox Grumbles, RN  Anti-Embolism Intervention: (heparin) Other (Comment)  Intervention: Prevent Infection  Recent Flowsheet Documentation  Taken 12/01/2022 0000 by Lennox Grumbles, RN  Infection Prevention: environmental surveillance performed  Goal: Optimal Comfort and Wellbeing  Outcome: Progressing  Goal: Readiness for Transition of Care  Outcome: Progressing  Goal: Rounds/Family Conference  Outcome: Progressing     Problem: Fall Injury Risk  Goal: Absence of Fall and Fall-Related Injury  Outcome: Progressing  Intervention: Promote Injury-Free Environment  Recent Flowsheet Documentation  Taken 12/01/2022 0000 by Lennox Grumbles, RN  Safety Interventions:   environmental modification   fall reduction program maintained     Problem: Pain Acute  Goal: Optimal Pain Control and Function  Outcome: Progressing     Problem: Hemodialysis  Goal: Safe, Effective Therapy Delivery  Outcome: Progressing  Goal: Effective Tissue Perfusion  Outcome: Progressing  Goal: Absence of Infection Signs and Symptoms  Outcome: Progressing  Intervention: Prevent or Manage Infection  Recent Flowsheet Documentation  Taken 12/01/2022 0000 by Lennox Grumbles, RN  Infection Management: aseptic technique maintained  Infection Prevention: environmental surveillance performed     Problem: Wound  Goal: Optimal Coping  Outcome: Progressing  Goal: Optimal Functional Ability  Outcome: Progressing  Goal: Absence of Infection Signs and Symptoms  Outcome: Progressing  Intervention: Prevent or Manage Infection  Recent Flowsheet Documentation  Taken 12/01/2022 0000 by Lennox Grumbles, RN  Infection Management: aseptic technique maintained  Goal: Improved Oral Intake  Outcome: Progressing  Goal: Optimal Pain Control and Function  Outcome: Progressing  Goal: Skin Health and Integrity  Outcome: Progressing  Intervention: Optimize Skin Protection  Recent Flowsheet Documentation  Taken 12/01/2022 0400 by Lennox Grumbles, RN  Pressure Reduction Techniques: frequent weight shift encouraged  Taken 12/01/2022 0200 by Lennox Grumbles, RN  Pressure Reduction Techniques: frequent weight shift encouraged  Taken 12/01/2022 0000 by Jeraldine Loots  H, RN  Pressure Reduction Techniques: frequent weight shift encouraged  Goal: Optimal Wound Healing  Outcome: Progressing

## 2022-12-01 NOTE — Unmapped (Signed)
Patient alert and oriented x 4 today.  Vitals stable.  Patient had dialysis this morning and had 1.5 liters removed and tolerated well.  Patient NPO tonight tonight at midnight for VIR tunneled catheter placement.  Dilauded given once for right lower leg pain and lidocaine patch applied.  No other concerns currently.  Patient cooperative.  Will monitor.       Problem: Adult Inpatient Plan of Care  Goal: Plan of Care Review  Outcome: Progressing  Goal: Patient-Specific Goal (Individualized)  11/30/2022 1726 by Azan Maneri K, RN  Outcome: Progressing  Flowsheets (Taken 11/30/2022 1726)  Patient/Family-Specific Goals (Include Timeframe): patient will tolerate dialysis without any issues and free from falls/injuries through discharge  11/30/2022 1550 by Thelia Tanksley K, RN  Flowsheets (Taken 11/30/2022 1532)  Individualized Care Needs: monitor vs/labs, pain control, HD cath monitoring,  Anxieties, Fears or Concerns: leg pain  Goal: Absence of Hospital-Acquired Illness or Injury  Outcome: Progressing  Goal: Optimal Comfort and Wellbeing  Outcome: Progressing  Goal: Readiness for Transition of Care  Outcome: Progressing  Goal: Rounds/Family Conference  Outcome: Progressing

## 2022-12-01 NOTE — Unmapped (Signed)
Patient alert and oriented x 4 today.  Vitals stable.  Patient NPO currently for tunneled dialysis line placement in Vascular radiology and procedure delayed so patient upset because he is hungry, but understanding.  Patient received one dose of pain medicine early this morning and was effective.  Lidocaine patch applied to right calf.  No other concerns currently.  Will monitor.       Problem: Adult Inpatient Plan of Care  Goal: Plan of Care Review  Outcome: Progressing  Goal: Patient-Specific Goal (Individualized)  Outcome: Progressing  Flowsheets (Taken 12/01/2022 1401)  Patient/Family-Specific Goals (Include Timeframe): Patient will get tunnelled dialysis line without complications.  No falls/injuries through discharge  Individualized Care Needs: monitor vs/labs, pain control, CVAD, HD T,TH,SA, falls/safety  Anxieties, Fears or Concerns: patient hungry and ready for procedure so he can eat  Goal: Absence of Hospital-Acquired Illness or Injury  Outcome: Progressing  Intervention: Identify and Manage Fall Risk  Recent Flowsheet Documentation  Taken 12/01/2022 0800 by Kijana Cromie K, RN  Safety Interventions:   fall reduction program maintained   low bed   nonskid shoes/slippers when out of bed  Intervention: Prevent Skin Injury  Recent Flowsheet Documentation  Taken 12/01/2022 0900 by Loy Sukaina Toothaker K, RN  Device Skin Pressure Protection:   absorbent pad utilized/changed   adhesive use limited  Skin Protection: adhesive use limited  Taken 12/01/2022 0800 by Chester Sibert K, RN  Device Skin Pressure Protection:   absorbent pad utilized/changed   adhesive use limited  Skin Protection: adhesive use limited  Intervention: Prevent and Manage VTE (Venous Thromboembolism) Risk  Recent Flowsheet Documentation  Taken 12/01/2022 0900 by Ireland Virrueta K, RN  Anti-Embolism Intervention: (heparin [place]) Other (Comment)  Intervention: Prevent Infection  Recent Flowsheet Documentation  Taken 12/01/2022 0800 by Kaisyn Reinhold K, RN  Infection Prevention:   environmental surveillance performed   hand hygiene promoted   personal protective equipment utilized   rest/sleep promoted   single patient room provided  Goal: Optimal Comfort and Wellbeing  Outcome: Progressing  Intervention: Provide Person-Centered Care  Recent Flowsheet Documentation  Taken 12/01/2022 1000 by Makayli Bracken K, RN  Trust Relationship/Rapport:   care explained   questions encouraged   reassurance provided  Goal: Readiness for Transition of Care  Outcome: Progressing  Goal: Rounds/Family Conference  Outcome: Progressing

## 2022-12-01 NOTE — Unmapped (Signed)
Internal Medicine (MEDU) Progress Note    Assessment & Plan:   Richard [name] is a 57 y.o. male with PMH NASH cirrhosis s/p OLT 2013, HFpEF, CKD5, persistent A-fib not on AC, CAD, HTN, OSA not on CPAP who presented to [facility] 5/22 with RLE cellulitis, currently in a prolonged admission due to AKI on CKD, elevated LFTs, and O2 requirement iso volume overload, now with progression to ESRD and tolerating HD. Awaiting perm HD line and HD chair.     Principal Problem:    ESRD (end stage renal disease) on dialysis (CMS-HCC)  Active Problems:    Hypertension    Liver replaced by transplant (CMS-HCC)    Immunosuppression due to drug therapy (CMS-HCC)    (HFpEF) heart failure with preserved ejection fraction (CMS-HCC)    Severe hyperglycemia due to diabetes mellitus (CMS-HCC)    Cellulitis of right lower extremity    Acute kidney injury superimposed on chronic kidney disease (CMS-HCC)    CAD (coronary artery disease)    Elevated liver enzymes    Hyperphosphatemia  Resolved Problems:    Hypomagnesemia    Hypokalemia    Active Problems    ESRD - Hyperphosphatemia, improving   Has been tolerating recent sessions of dialysis, will obtain more permanent access with tunneled line planned 6/7.  Volume status now improved.  BUN  and phosphorous levels with significant improvements.  -CM working on dialysis chair placement.   - Nephro following, appreciate recs  - Tunneled Line 6/7 NPO at midnight   - PO sevelamer 1600 mg TID  - Hold Spironolactone, Lasix  - Daily BMP, Mg, Phos  - AV fistula created at [facility] on 6/5    T2DM complicated by hyperglycemia  Titrating based on glucose levels  -Increase Lantus from 15 to 18 units nightly  - Humalog 35u at mealtimes (TID) (home 55u)  - SSI  - POCT BG QID    Elevated LFTs, slowly improving - s/p Liver Transplant in 2013  LFTs elevated on admission, have improved similar to prior baseline.  Suspect element of congestive hepatopathy.  MRCP considered but requires general anesthesia and deferred.  - Continue home Cellcept, Tacrolimus (goal 2-4, dosing per pharmacy)  - outpatient MRCP  - Ok to dose Tylenol <4g per day  - Daily HFP    Intermittent Oxygen Requirement - OSA, not on CPAP - Pulmonary Edema  Patient previously with pulmonary edema in the setting of volume overload from kidney disease and heart failure, significant volume removed via multiple sessions of dialysis and patient weaned to room air.  Will intermittently be placed on oxygen overnight for subjective shortness of breath.  Patient likely has OSA, discussed CPAP  If oxygen requirement persist despite CPAP use, consider repeat chest x-ray and RPP to rule out viral infection.  - CPAP at night if willing  - Outpatient sleep study (order was put in on 11/16/2022)    RLE Cellulitis, resolved - R Leg Pain  Patient with multiple episodes of cellulitis presented with right lower extremity cellulitis. Cellulitis has resolved with abx course as below.  Patient with pain in right leg, with right leg mildly larger than left.  PVLs without clot.  - Bcx 5/22: NGTD  - Bcx 5/24: NGTD  - Antibiotics:    - s/p Clindamycin (5/22)  - s/p Vancomycin (5/23)   - s/p Doxycycline (5/24 - 5/25)   - s/p Ceftriaxone (5/25)   - s/p Cefepime (5/25 - 5/28)   - s/p Linezolid (5/25 -  5/31)   - s/p Ciprofloxacin (5/28 - 5/31)  - Daily CBC  - PO dilaudid PRN    Anemia of chronic disease  No apparent blood loss, no bright red blood per rectum or melena or hematuria. Has been hemodynamically stable.  -Epo to be set up with HD as outpatient  -Daily CBC  -Monitor for bleeding    Chronic Conditions  Hypertension: Continue amlodipine 10 mg, Coreg 6.25 mg twice daily.   HFpEF - Hx A-Fib - CAD: hold spironolactone iso worsening renal function, continue carvedilol    Daily Checklist:  Diet: Regular Diet  DVT PPx:  heparin SQ  Electrolytes:  K > 3.6 Mg >2 goal  caution with repletion given ESRD  Code Status: Full Code  Dispo: Home pending tunneled line placement with VIR and HD chair     Team Contact Information:   Primary Team: Internal Medicine (MEDU)  Primary Resident: Kenn File, MD  Resident's Pager: [phone] (Gen MedU Intern - Alvester Morin)    Interval History:   No adverse events overnight. Ready for tunneled line placement today    All other systems were reviewed and are negative except as noted in the HPI    Objective:   Temp:  [36.5 ??C (97.7 ??F)-37.7 ??C (99.9 ??F)] 37 ??C (98.6 ??F)  Heart Rate:  [64-75] 70  Resp:  [18-20] 18  BP: (122-144)/(60-93) 132/70  SpO2:  [95 %-100 %] 98 %    Gen:  NAD, converses normally. Laying in bed  HENT:  Atraumatic, normocephalic.  Heart:  RRR, without murmurs  Lungs: Normal work of breathing on room air  Extremities: Prior site of cellulitis without any erythema  Neuro: Symmetric movements, oriented and conversational

## 2022-12-02 LAB — HEPATIC FUNCTION PANEL
ALBUMIN: 2.5 g/dL — ABNORMAL LOW (ref 3.4–5.0)
ALKALINE PHOSPHATASE: 150 U/L — ABNORMAL HIGH (ref 46–116)
ALT (SGPT): 11 U/L (ref 10–49)
AST (SGOT): 31 U/L (ref <=34)
BILIRUBIN DIRECT: 0.2 mg/dL (ref 0.00–0.30)
BILIRUBIN TOTAL: 0.4 mg/dL (ref 0.3–1.2)
PROTEIN TOTAL: 5.9 g/dL (ref 5.7–8.2)

## 2022-12-02 LAB — MAGNESIUM: MAGNESIUM: 2 mg/dL (ref 1.6–2.6)

## 2022-12-02 LAB — PHOSPHORUS: PHOSPHORUS: 7.5 mg/dL — ABNORMAL HIGH (ref 2.4–5.1)

## 2022-12-02 LAB — CBC
HEMATOCRIT: 25.4 % — ABNORMAL LOW (ref 39.0–48.0)
HEMOGLOBIN: 8.5 g/dL — ABNORMAL LOW (ref 12.9–16.5)
MEAN CORPUSCULAR HEMOGLOBIN CONC: 33.5 g/dL (ref 32.0–36.0)
MEAN CORPUSCULAR HEMOGLOBIN: 28.6 pg (ref 25.9–32.4)
MEAN CORPUSCULAR VOLUME: 85.4 fL (ref 77.6–95.7)
MEAN PLATELET VOLUME: 9 fL (ref 6.8–10.7)
PLATELET COUNT: 205 10*9/L (ref 150–450)
RED BLOOD CELL COUNT: 2.97 10*12/L — ABNORMAL LOW (ref 4.26–5.60)
RED CELL DISTRIBUTION WIDTH: 13.9 % (ref 12.2–15.2)
WBC ADJUSTED: 8 10*9/L (ref 3.6–11.2)

## 2022-12-02 LAB — BASIC METABOLIC PANEL
ANION GAP: 10 mmol/L (ref 5–14)
BLOOD UREA NITROGEN: 32 mg/dL — ABNORMAL HIGH (ref 9–23)
BUN / CREAT RATIO: 5
CALCIUM: 8.2 mg/dL — ABNORMAL LOW (ref 8.7–10.4)
CHLORIDE: 103 mmol/L (ref 98–107)
CO2: 29 mmol/L (ref 20.0–31.0)
CREATININE: 6.4 mg/dL — ABNORMAL HIGH
EGFR CKD-EPI (2021) MALE: 10 mL/min/{1.73_m2} — ABNORMAL LOW (ref >=60)
GLUCOSE RANDOM: 212 mg/dL — ABNORMAL HIGH (ref 70–179)
POTASSIUM: 4 mmol/L (ref 3.4–4.8)
SODIUM: 142 mmol/L (ref 135–145)

## 2022-12-02 MED ADMIN — epoetin alfa-EPBX (RETACRIT) injection 3,000 Units: 3000 [IU] | SUBCUTANEOUS | @ 12:00:00

## 2022-12-02 MED ADMIN — gentamicin-sodium citrate lock solution in NS: 1.4 mL | @ 16:00:00

## 2022-12-02 MED ADMIN — heparin (porcine) 5,000 unit/mL injection 7,500 Units: 7500 [IU] | SUBCUTANEOUS | @ 17:00:00

## 2022-12-02 MED ADMIN — carvedilol (COREG) tablet 6.25 mg: 6.25 mg | ORAL | @ 01:00:00

## 2022-12-02 MED ADMIN — sevelamer (RENVELA) tablet 1,600 mg: 1600 mg | ORAL | @ 17:00:00

## 2022-12-02 MED ADMIN — tacrolimus (PROGRAF) capsule 1 mg: 1 mg | ORAL | @ 01:00:00

## 2022-12-02 MED ADMIN — insulin lispro (HumaLOG) injection 35 Units: 35 [IU] | SUBCUTANEOUS | @ 17:00:00

## 2022-12-02 MED ADMIN — mycophenolate (CELLCEPT) capsule 250 mg: 250 mg | ORAL | @ 17:00:00

## 2022-12-02 MED ADMIN — spironolactone (ALDACTONE) tablet 25 mg: 25 mg | ORAL | @ 17:00:00

## 2022-12-02 MED ADMIN — HYDROmorphone (DILAUDID) tablet 2 mg: 2 mg | ORAL | @ 03:00:00 | Stop: 2022-12-03

## 2022-12-02 MED ADMIN — insulin glargine (LANTUS) injection 18 Units: 18 [IU] | SUBCUTANEOUS | @ 01:00:00

## 2022-12-02 MED ADMIN — heparin (porcine) 5,000 unit/mL injection 7,500 Units: 7500 [IU] | SUBCUTANEOUS | @ 11:00:00

## 2022-12-02 MED ADMIN — tacrolimus (PROGRAF) capsule 1 mg: 1 mg | ORAL | @ 17:00:00

## 2022-12-02 MED ADMIN — HYDROmorphone (DILAUDID) tablet 2 mg: 2 mg | ORAL | @ 17:00:00 | Stop: 2022-12-03

## 2022-12-02 MED ADMIN — mycophenolate (CELLCEPT) capsule 250 mg: 250 mg | ORAL | @ 01:00:00

## 2022-12-02 MED ADMIN — pantoprazole (Protonix) EC tablet 40 mg: 40 mg | ORAL | @ 17:00:00

## 2022-12-02 MED ADMIN — heparin (porcine) 5,000 unit/mL injection 7,500 Units: 7500 [IU] | SUBCUTANEOUS | @ 01:00:00

## 2022-12-02 MED ADMIN — lidocaine 4 % patch 1 patch: 1 | TRANSDERMAL | @ 17:00:00

## 2022-12-02 MED ADMIN — insulin lispro (HumaLOG) injection 0-20 Units: 0-20 [IU] | SUBCUTANEOUS | @ 12:00:00

## 2022-12-02 MED ADMIN — amlodipine (NORVASC) tablet 10 mg: 10 mg | ORAL | @ 17:00:00

## 2022-12-02 MED ADMIN — insulin lispro (HumaLOG) injection 0-20 Units: 0-20 [IU] | SUBCUTANEOUS | @ 01:00:00

## 2022-12-02 MED ADMIN — insulin lispro (HumaLOG) injection 0-20 Units: 0-20 [IU] | SUBCUTANEOUS | @ 20:00:00

## 2022-12-02 NOTE — Unmapped (Signed)
Internal Medicine (MEDU) Progress Note    Assessment & Plan:   Richard [name] is a 57 y.o. male with PMH NASH cirrhosis s/p OLT 2013, HFpEF, CKD5, persistent A-fib not on AC, CAD, HTN, OSA not on CPAP who presented to [facility] 5/22 with RLE cellulitis, currently in a prolonged admission due to AKI on CKD, elevated LFTs, and O2 requirement iso volume overload, now with progression to ESRD and tolerating HD. Awaiting perm HD line and HD chair.     Principal Problem:    ESRD (end stage renal disease) on dialysis (CMS-HCC)  Active Problems:    Hypertension    Liver replaced by transplant (CMS-HCC)    Immunosuppression due to drug therapy (CMS-HCC)    (HFpEF) heart failure with preserved ejection fraction (CMS-HCC)    Severe hyperglycemia due to diabetes mellitus (CMS-HCC)    Cellulitis of right lower extremity    Acute kidney injury superimposed on chronic kidney disease (CMS-HCC)    CAD (coronary artery disease)    Elevated liver enzymes    Hyperphosphatemia  Resolved Problems:    Hypomagnesemia    Hypokalemia    Active Problems    ESRD - Hyperphosphatemia, improving   Has been tolerating recent sessions of dialysis, will obtain more permanent access with tunneled line planned 6/7.  Volume status now improved.  BUN  and phosphorous levels with significant improvements.  -CM working on dialysis chair placement.   - Nephro following, appreciate recs  - Tunneled Line TBD possibly 6/10 NPO at midnight   - PO sevelamer 1600 mg TID  - Hold Spironolactone, Lasix  - Daily BMP, Mg, Phos  - AV fistula created at [facility] on 6/5    T2DM complicated by hyperglycemia  Titrating based on glucose levels  -Increase Lantus from 15 to 18 units nightly  - Humalog 35u at mealtimes (TID) (home 55u)  - SSI  - POCT BG QID    Elevated LFTs, slowly improving - s/p Liver Transplant in 2013  LFTs elevated on admission, have improved similar to prior baseline.  Suspect element of congestive hepatopathy.  MRCP considered but requires general anesthesia and deferred.  - Continue home Cellcept, Tacrolimus (goal 2-4, dosing per pharmacy)  - outpatient MRCP  - Ok to dose Tylenol <4g per day  - Daily HFP    Intermittent Oxygen Requirement - OSA, not on CPAP - Pulmonary Edema  Patient previously with pulmonary edema in the setting of volume overload from kidney disease and heart failure, significant volume removed via multiple sessions of dialysis and patient weaned to room air.  Will intermittently be placed on oxygen overnight for subjective shortness of breath.  Patient likely has OSA, discussed CPAP  If oxygen requirement persist despite CPAP use, consider repeat chest x-ray and RPP to rule out viral infection.  - CPAP at night if willing  - Outpatient sleep study (order was put in on 11/16/2022)    RLE Cellulitis, resolved - R Leg Pain  Patient with multiple episodes of cellulitis presented with right lower extremity cellulitis. Cellulitis has resolved with abx course as below.  Patient with pain in right leg, with right leg mildly larger than left.  PVLs without clot.  - Bcx 5/22: NGTD  - Bcx 5/24: NGTD  - Antibiotics:    - s/p Clindamycin (5/22)  - s/p Vancomycin (5/23)   - s/p Doxycycline (5/24 - 5/25)   - s/p Ceftriaxone (5/25)   - s/p Cefepime (5/25 - 5/28)   - s/p Linezolid (  5/25 - 5/31)   - s/p Ciprofloxacin (5/28 - 5/31)  - Daily CBC  - PO dilaudid PRN    Anemia of chronic disease  No apparent blood loss, no bright red blood per rectum or melena or hematuria. Has been hemodynamically stable.  -Epo to be set up with HD as outpatient  -Daily CBC  -Monitor for bleeding    Chronic Conditions  Hypertension: Continue amlodipine 10 mg, Coreg 6.25 mg twice daily.   HFpEF - Hx A-Fib - CAD: hold spironolactone iso worsening renal function, continue carvedilol    Daily Checklist:  Diet: Regular Diet  DVT PPx:  heparin SQ  Electrolytes:  K > 3.6 Mg >2 goal  caution with repletion given ESRD  Code Status: Full Code  Dispo: Home pending tunneled line placement with VIR and HD chair     Team Contact Information:   Primary Team: Internal Medicine (MEDU)  Primary Resident: Kenn File, MD  Resident's Pager: [phone] (Gen MedU Intern - Alvester Morin)    Interval History:   No adverse events overnight. Denies pain    All other systems were reviewed and are negative except as noted in the HPI    Objective:   Temp:  [36.3 ??C (97.3 ??F)-37 ??C (98.6 ??F)] 36.6 ??C (97.8 ??F)  Heart Rate:  [56-74] 56  Resp:  [17-19] 18  BP: (123-157)/(60-103) 137/65  SpO2:  [94 %-96 %] 94 %    Gen:  NAD, converses normally. Laying in bed  HENT:  Atraumatic, normocephalic.  Heart:  RRR, without murmurs  Lungs: Normal work of breathing on room air  Extremities: Prior site of cellulitis without any erythema  Neuro: Symmetric movements, oriented and conversational

## 2022-12-02 NOTE — Unmapped (Signed)
4 hours Hd today with uf goal of 3 liters  Problem: Hemodialysis  Goal: Safe, Effective Therapy Delivery  Outcome: Ongoing - Unchanged  Goal: Effective Tissue Perfusion  Outcome: Ongoing - Unchanged  Goal: Absence of Infection Signs and Symptoms  Outcome: Ongoing - Unchanged

## 2022-12-02 NOTE — Unmapped (Signed)
Patient on room air, alert and responsive with no distress noted.  Tolerated medications with no adverse reactions noted.   Patient denies pain and discomfort at this time.  Bed in lowest position, callbell within reach and continue to monitor for safety.  Patient schedule for dialysis therapy this morning.      Problem: Adult Inpatient Plan of Care  Goal: Plan of Care Review  Outcome: Progressing  Flowsheets (Taken 12/02/2022 0533)  Progress: improving  Goal: Absence of Hospital-Acquired Illness or Injury  Outcome: Progressing  Intervention: Identify and Manage Fall Risk  Recent Flowsheet Documentation  Taken 12/01/2022 1954 by Valeta Harms, RN  Safety Interventions:   aspiration precautions   bed alarm   enteral feeding safety   environmental modification   fall reduction program maintained  Intervention: Prevent and Manage VTE (Venous Thromboembolism) Risk  Recent Flowsheet Documentation  Taken 12/02/2022 0400 by Valeta Harms, RN  Anti-Embolism Intervention: (Heparin SQ) Other (Comment)    Intervention: Prevent Infection  Recent Flowsheet Documentation  Taken 12/01/2022 1954 by Valeta Harms, RN  Infection Prevention: environmental surveillance performed  Goal: Optimal Comfort and Wellbeing  Outcome: Progressing  Goal: Readiness for Transition of Care  Outcome: Progressing     Problem: Pain Acute  Goal: Optimal Pain Control and Function  Outcome: Progressing     Problem: Hemodialysis  Goal: Safe, Effective Therapy Delivery  Outcome: Progressing  Goal: Absence of Infection Signs and Symptoms  Intervention: Prevent or Manage Infection  Recent Flowsheet Documentation  Taken 12/01/2022 1954 by Valeta Harms, RN  Infection Prevention: environmental surveillance performed     Problem: Wound  Goal: Absence of Infection Signs and Symptoms  Outcome: Progressing  Goal: Optimal Pain Control and Function  Outcome: Progressing  Goal: Skin Health and Integrity  Intervention: Optimize Skin Protection  Recent Flowsheet Documentation  Taken 12/02/2022 0200 by Valeta Harms, RN  Head of Bed [facility]) Positioning: HOB elevated

## 2022-12-02 NOTE — Unmapped (Signed)
HEMODIALYSIS NURSE PROCEDURE NOTE       Treatment Number:  6 Room / Station:  5    Procedure Date:  12/02/22 Device Name/Number: EMMITT    Total Dialysis Treatment Time:  241 Min.    CONSENT:    Written consent was obtained prior to the procedure and is detailed in the medical record.  Prior to the start of the procedure, a time out was taken and the identity of the patient was confirmed via name, medical record number and date of birth.     WEIGHT:  Hemodialysis Pre-Treatment Weights       Date/Time Pre-Treatment Weight (kg) Estimated Dry Weight (kg) Patient Goal Weight (kg) Total Goal Weight (kg)    12/02/22 0930 -- -- 3 kg (6 lb 9.8 oz)  per Dr Deboraha Sprang 3.55 kg (7 lb 13.2 oz)    12/02/22 0742 136.8 kg (301 lb 9.4 oz) --  TBD 1.5 kg (3 lb 4.9 oz) 2.05 kg (4 lb 8.3 oz)           Hemodialysis Post Treatment Weights       Date/Time Post-Treatment Weight (kg) Treatment Weight Change (kg)    12/02/22 1211 133.5 kg (294 lb 5 oz) -3.31 kg          Active Dialysis Orders (168h ago, onward)       Start     Ordered    11/25/22 0919  Hemodialysis inpatient  Every Tue,Thu,Sat      Question Answer Comment   Patient HD Status: Chronic    New Start? Yes    K+ 3 meq/L    Ca++ 2.5 meq/L    Bicarb 35 meq/L    Na+ 137 meq/L    Na+ Modeling no    Dialyzer [id number]    Dialysate Temperature (C) 36    BFR-As tolerated to a maximum of: 400 mL/min see comments   DFR 800 mL/min see comments   Duration of treatment 4 Hr see comments   Dry weight (kg) TBD    Challenge dry weight (kg) yes    Fluid removal (L) 1.5L on 11/25/22    Tubing Adult = 142 ml    Access Site Dialysis Catheter    Access Site Location Right    Keep SBP >: 100        11/25/22 0918                  ASSESSMENT:  General appearance: alert  Neurologic: Grossly normal  Lungs: clear to auscultation bilaterally  Heart: regular rate and rhythm, S1, S2 normal, no murmur, click, rub or gallop    ACCESS SITE:          Hemodialysis Catheter With Distal Infusion Port 11/23/22 Right Internal jugular 1.4 mL 1.4 mL (Active)   Site Assessment Clean;Dry;Intact 12/02/22 1212   Proximal Lumen Status / Patency Gentamicin Citrate Locked 12/02/22 1212   Proximal Lumen Intervention Deaccessed 12/02/22 1212   Medial Lumen Status / Patency Gentamicin Citrate Locked 12/02/22 1212   Medial Lumen Intervention Deaccessed 12/02/22 1212   Lumen 3, Distal Status / Patency Blood Return - Brisk 12/01/22 2100   Distal Lumen Intervention Flushed 12/01/22 2100   Dressing Type CHG gel;Occlusive 12/02/22 1212   Dressing Status      Clean;Intact/not removed;Dry 12/02/22 1212   Dressing Intervention No intervention needed 12/02/22 1212   Contraindicated due to: Dressing Intact surrounding insertion site 11/30/22 1258   Dressing Change Due 12/06/22 12/02/22 1212   Line  Necessity Reviewed? Y 12/02/22 1212   Line Necessity Indications Yes - Hemodialysis 12/02/22 1212   Line Necessity Reviewed With MED U 12/02/22 1212     Arteriovenous Fistula - Vein Graft  Access 11/29/22 1216 Arteriovenous fistula Right Forearm (Active)   Site Assessment Clean;Dry;Intact 12/01/22 2100   AV Fistula Thrill Present;Bruit Present 11/29/22 1200   Status Deaccessed 11/29/22 1200   Dressing Intervention New dressing 11/29/22 1200   Dressing Gauze;Occlusive 11/29/22 1200     Catheter fill volumes:    Arterial: 1.4 mL Venous: 1.4   mL   Catheter filled with Gentamicin citrate locks post procedure.     Patient Lines/Drains/Airways Status       Active Peripheral & Central Intravenous Access       Name Placement date Placement time Site Days    Hemodialysis Catheter With Distal Infusion Port 11/23/22 Right Internal jugular 1.4 mL 1.4 mL 11/23/22  1324  Internal jugular  8                   LAB RESULTS:  Lab Results   Component Value Date    NA 142 12/02/2022    K 4.0 12/02/2022    CL 103 12/02/2022    CO2 29.0 12/02/2022    BUN 32 (H) 12/02/2022    CREATININE 6.40 (H) 12/02/2022    GLU 212 (H) 12/02/2022    GLUF 138 10/06/2014    CALCIUM 8.2 (L) 12/02/2022    CAION 4.57 11/20/2022    ICAV 4.88 03/04/2012    PHOS 7.5 (H) 12/02/2022    MG 2.0 12/02/2022    PTH 267.5 (H) 10/14/2021    IRON 95 11/09/2022    LABIRON 36 11/09/2022    TRANSFERRIN 212.0 (L) 11/09/2022    FERRITIN 169.8 11/09/2022    TIBC 267.1 11/09/2022     Lab Results   Component Value Date    WBC 8.0 12/02/2022    HGB 8.5 (L) 12/02/2022    HCT 25.4 (L) 12/02/2022    PLT 205 12/02/2022    PHART 7.36 03/04/2012    PO2ART 88 03/04/2012    PCO2ART 43 03/04/2012    HCO3ART 23.4 03/04/2012    BEART -1.3 03/04/2012    O2SATART 98.1 03/04/2012    PTINR : 05/06/2012    APTT 26.1 04/24/2022        VITAL SIGNS:   Temperature       Date/Time Temp Temp src       12/02/22 1223 36.6 ??C (97.8 ??F) --     12/02/22 0742 36.8 ??C (98.2 ??F) --           Hemodynamics       Date/Time Pulse BP MAP (mmHg) Patient Position    12/02/22 1223 56 137/65 87 Sitting    12/02/22 1209 57 134/72 -- Sitting    12/02/22 1200 61 145/82 -- Sitting    12/02/22 1130 64 150/89 -- Sitting    12/02/22 1100 56 145/79 -- Sitting    12/02/22 1030 65 134/70 -- Sitting    12/02/22 1000 74 138/72 -- Sitting    12/02/22 0930 60 144/79 -- Sitting    12/02/22 0900 56 129/103 -- Sitting    12/02/22 0830 66 146/83 -- Sitting    12/02/22 0808 68 123/88 -- Sitting    12/02/22 0742 66 123/88 -- Sitting          Blood Volume Monitor       Date/Time Blood Volume Change (%) HCT  HGB Critline O2 SAT %    12/02/22 1209 -7.7 % 31.5 10.7 69.6    12/02/22 1200 -7.1 % 31.3 10.6 70.7    12/02/22 1130 -7.1 % 31.2 10.6 70.6    12/02/22 1100 -6.7 % 31.1 10.6 70.3    12/02/22 1030 -5.9 % 30.9 10.5 69    12/02/22 1000 -5 % 30.6 10.4 60    12/02/22 0930 -2.7 % 29.8 10.1 58.3    12/02/22 0900 -1.5 % 29.5 10 66.9    12/02/22 0830 -0.8 % 29.3 10 66          Oxygen Therapy       Date/Time Resp SpO2 O2 Device O2 Flow Rate (L/min)    12/02/22 1223 18 -- None (Room air) --    12/02/22 1209 17 -- None (Room air) --    12/02/22 1200 18 -- None (Room air) --    12/02/22 1130 17 -- None (Room air) --    12/02/22 1100 18 -- None (Room air) --    12/02/22 1030 17 -- None (Room air) --    12/02/22 1000 18 -- None (Room air) --    12/02/22 0930 19 -- None (Room air) --    12/02/22 0900 18 -- None (Room air) --    12/02/22 0830 17 -- None (Room air) --    12/02/22 0808 18 -- None (Room air) --    12/02/22 0742 18 -- -- --            Pre-Hemodialysis Assessment       Date/Time Therapy Number Dialyzer Hemodialysis Line Type All Machine Alarms Passed    12/02/22 0742 6 F-180 (98 mLs) Adult (142 m/s) Yes      Date/Time Air Detector Saline Line Double Clampled Hemo-Safe Applied Dialysis Flow (mL/min)    12/02/22 0742 Engaged -- -- 800 mL/min      Date/Time Verify Priming Solution Priming Volume Hemodialysis Independent pH Hemodialysis Machine Conductivity (mS/cm)    12/02/22 0742 0.9% NS 300 mL -- 13.7 mS/cm      Date/Time Hemodialysis Independent Conductivity (mS/cm) Bicarb Conductivity Residual Bleach Negative Total Chlorine    12/02/22 0742 13.6 mS/cm -- Yes 0          Pre-Hemodialysis Treatment Comments       Date/Time Pre-Hemodialysis Comments    12/02/22 0742 A/Ox4, arrived in [facility]          Hemodialysis Treatment       Date/Time Blood Flow Rate (mL/min) Arterial Pressure (mmHg) Venous Pressure (mmHg) Transmembrane Pressure (mmHg)    12/02/22 1209 150 mL/min -154 mmHg 148 mmHg 9 mmHg    12/02/22 1200 400 mL/min -156 mmHg 150 mmHg 30 mmHg    12/02/22 1130 400 mL/min -156 mmHg 150 mmHg 31 mmHg    12/02/22 1100 400 mL/min -154 mmHg 150 mmHg 32 mmHg    12/02/22 1030 400 mL/min -154 mmHg 151 mmHg 31 mmHg    12/02/22 1000 400 mL/min -152 mmHg 154 mmHg 34 mmHg    12/02/22 0930 400 mL/min -150 mmHg 163 mmHg 26 mmHg    12/02/22 0900 400 mL/min -144 mmHg 148 mmHg 30 mmHg    12/02/22 0830 400 mL/min -142 mmHg 152 mmHg 30 mmHg    12/02/22 0808 400 mL/min -118 mmHg 72 mmHg 28 mmHg      Date/Time Ultrafiltration Rate (mL/hr) Ultrafiltrate Removed (mL) Dialysate Flow Rate (mL/min) KECN Linna Caprice)    12/02/22 1209 1730 mL/hr 3528 mL 800 ml/min --  12/02/22 1200 1090 mL/hr 3331 mL 800 ml/min --    12/02/22 1130 1090 mL/hr 2791 mL 800 ml/min --    12/02/22 1100 1090 mL/hr 2254 mL 800 ml/min --    12/02/22 1030 1090 mL/hr 1713 mL 800 ml/min --    12/02/22 1000 1090 mL/hr 1173 mL 800 ml/min --    12/02/22 0930 510 mL/hr 679 mL 800 ml/min --    12/02/22 0900 510 mL/hr 426 mL 800 ml/min --    12/02/22 0830 510 mL/hr 174 mL 800 ml/min --    12/02/22 0808 0 mL/hr 100 mL 800 ml/min --          Hemodialysis Treatment Comments       Date/Time Intra-Hemodialysis Comments    12/02/22 1209 treatment terminated, rinse back given    12/02/22 1200 pt tolerating tx    12/02/22 1130 ptwatching tv    12/02/22 1100 pt tolerating tx    12/02/22 1030 pt tolerating tx    12/02/22 1000 pt sleeping, stable    12/02/22 0930 uf goal increased to 3 liters by Dr Deboraha Sprang, pt stable    12/02/22 0900 pt watching tv, no complaints    12/02/22 0830 VSS    12/02/22 0808 treatment initiated          Post Treatment       Date/Time Rinseback Volume (mL) On Line Clearance: spKt/V Total Liters Processed (L/min) Dialyzer Clearance    12/02/22 1211 300 mL -- 88.8 L/min Moderately streaked          Post Hemodialysis Treatment Comments       Date/Time Post-Hemodialysis Comments    12/02/22 1211 Alert and stable          Hemodialysis I/O       Date/Time Total Hemodialysis Replacement Volume (mL) Total Ultrafiltrate Output (mL)    12/02/22 1211 -- 3000 mL            [id number] - Medicaitons Given During Treatment  (last 5 hrs)           ALPHONSE, PRIYATHARSHINI, RN         Medication Name Action Time Action Route Rate Dose User     insulin lispro (HumaLOG) injection 0-20 Units 12/02/22 0732 Given Subcutaneous  4 Units Angelena Form, RN     insulin lispro (HumaLOG) injection 35 Units 12/02/22 0741 Not Given Subcutaneous  35 Units Angelena Form, RN     sevelamer (RENVELA) tablet 1,600 mg 12/02/22 1610 Not Given Oral  1,600 mg Angelena Form, RN            Ralyn Stlaurent, RN         Medication Name Action Time Action Route Rate Dose User     epoetin alfa-EPBX (RETACRIT) injection 3,000 Units 12/02/22 0826 Given Subcutaneous  3,000 Units Asencion Islam, RN     gentamicin-sodium citrate lock solution in NS 12/02/22 1209 Given Intra-cannular  1.4 mL Asencion Islam, RN     gentamicin-sodium citrate lock solution in NS 12/02/22 1209 Given Intra-cannular  1.4 mL Asencion Islam, RN                      Patient tolerated treatment in a  Dialysis Recliner.

## 2022-12-02 NOTE — Unmapped (Addendum)
Pt remains alert/oriented x 4. Breathing well on room air. Had HD done today. Dilaudid given for pain on RLE upon returning from HD. Pt had lunch, skipped breakfast and still thinking abt dinner whether to have or not. Insulin coverage provided. Had 1 BM. Voiding. Right arm no sticks/BP precautions followed. Vss. Carvedilol held due to low HR.   Problem: Adult Inpatient Plan of Care  Goal: Plan of Care Review  Outcome: Ongoing - Unchanged  Flowsheets (Taken 12/02/2022 1446)  Progress: no change  Plan of Care Reviewed With: patient  Goal: Patient-Specific Goal (Individualized)  Outcome: Ongoing - Unchanged  Goal: Absence of Hospital-Acquired Illness or Injury  Outcome: Ongoing - Unchanged  Intervention: Identify and Manage Fall Risk  Recent Flowsheet Documentation  Taken 12/02/2022 1400 by Angelena Form, RN  Safety Interventions:   environmental modification   fall reduction program maintained   low bed   no IV/BP/blood draw right arm  Taken 12/02/2022 0730 by Angelena Form, RN  Safety Interventions:   environmental modification   fall reduction program maintained   low bed   no IV/BP/blood draw right arm  Intervention: Prevent and Manage VTE (Venous Thromboembolism) Risk  Recent Flowsheet Documentation  Taken 12/02/2022 0730 by Angelena Form, RN  Anti-Embolism Intervention: (hep SQ) Other (Comment)  Goal: Optimal Comfort and Wellbeing  Outcome: Ongoing - Unchanged  Goal: Readiness for Transition of Care  Outcome: Ongoing - Unchanged  Goal: Rounds/Family Conference  Outcome: Ongoing - Unchanged

## 2022-12-02 NOTE — Unmapped (Signed)
[facility] [place] Nephrology Hemodialysis Procedure Note     12/02/2022    Richard [name] was seen and examined on hemodialysis    CHIEF COMPLAINT: End Stage Renal Disease    INTERVAL HISTORY: Doing well today    DIALYSIS TREATMENT DATA:  Estimated Dry Weight (kg):  (TBD) Patient Goal Weight (kg): 1.5 kg (3 lb 4.9 oz)   Pre-Treatment Weight (kg): 136.8 kg (301 lb 9.4 oz)    Dialysis Bath  Bath: 3 K+ / 2.5 Ca+  Dialysate Na (mEq/L): 137 mEq/L  Dialysate HCO3 (mEq/L): 35 mEq/L Dialyzer: F-180 (98 mLs)   Blood Flow Rate (mL/min): 400 mL/min Dialysis Flow (mL/min): 800 mL/min   Machine Temperature (C): 36 ??C (96.8 ??F)      PHYSICAL EXAM:  Vitals:  Temp:  [36.3 ??C (97.3 ??F)-37 ??C (98.6 ??F)] 36.8 ??C (98.2 ??F)  Heart Rate:  [56-68] 56  BP: (123-157)/(60-103) 129/103  MAP (mmHg):  [87-107] 107    General: healthy, obese, currently dialyzing in a Hemodialysis Recliner  Pulmonary: normal respiratory effort  Cardiovascular: regular rate and rhythm  Extremities: 2+ R arm in sling, AVF site looks good, hand warm and pink  Access: Right IJ tunneled catheter - exit looks ok    LAB DATA:  Lab Results   Component Value Date    NA 142 12/02/2022    K 4.0 12/02/2022    CL 103 12/02/2022    CO2 29.0 12/02/2022    BUN 32 (H) 12/02/2022    CREATININE 6.40 (H) 12/02/2022    GLUCOSE 196 03/03/2012    CALCIUM 8.2 (L) 12/02/2022    MG 2.0 12/02/2022    PHOS 7.5 (H) 12/02/2022    ALBUMIN 2.5 (L) 12/02/2022      Lab Results   Component Value Date    HCT 25.4 (L) 12/02/2022    WBC 8.0 12/02/2022        ASSESSMENT/PLAN:  End Stage Renal Disease on Intermittent Hemodialysis:  UF goal: 3 - 4L as tolerated  Adjust medications for a GFR <10  Avoid nephrotoxic agents  Last HD Treatment:Started (12/02/22)     Bone Mineral Metabolism:  Lab Results   Component Value Date    CALCIUM 8.2 (L) 12/02/2022    CALCIUM 8.3 (L) 12/01/2022    Lab Results   Component Value Date    ALBUMIN 2.5 (L) 12/02/2022    ALBUMIN 2.7 (L) 12/01/2022      Lab Results   Component Value Date PHOS 7.5 (H) 12/02/2022    PHOS 5.8 (H) 12/01/2022    Lab Results   Component Value Date    PTH 267.5 (H) 10/14/2021      Hypoalbuminemia. Hyperphosphatemia should improve with dialysis, also on sevelamer    Anemia:   Lab Results   Component Value Date    HGB 8.5 (L) 12/02/2022    HGB 8.5 (L) 12/01/2022    HGB 8.6 (L) 11/30/2022    Iron Saturation (%)   Date Value Ref Range Status   11/09/2022 36 % Final   05/15/2012 39 20 - 50 % Final      Lab Results   Component Value Date    FERRITIN 169.8 11/09/2022       3000 units started 6/6    Vascular Access:  Hopefully, will get tunneled line today. His AVF will require 2nd stage/superficialization,  Blood Flow Rate (mL/min): 400 mL/min    IV Antibiotics to be administered at discharge:  No    This procedure was fully  reviewed with the patient and/or their decision-maker. The risks, benefits, and alternatives were discussed prior to the procedure. All questions were answered and written informed consent was obtained.    Donato Heinz, MD  [facility] Division of Nephrology & Hypertension

## 2022-12-03 MED ADMIN — insulin glargine (LANTUS) injection 18 Units: 18 [IU] | SUBCUTANEOUS | @ 02:00:00

## 2022-12-03 MED ADMIN — insulin lispro (HumaLOG) injection 0-20 Units: 0-20 [IU] | SUBCUTANEOUS | @ 02:00:00

## 2022-12-03 MED ADMIN — tacrolimus (PROGRAF) capsule 1 mg: 1 mg | ORAL

## 2022-12-03 MED ADMIN — sevelamer (RENVELA) tablet 1,600 mg: 1600 mg | ORAL | @ 21:00:00

## 2022-12-03 MED ADMIN — insulin lispro (HumaLOG) injection 35 Units: 35 [IU] | SUBCUTANEOUS | @ 16:00:00

## 2022-12-03 MED ADMIN — pantoprazole (Protonix) EC tablet 40 mg: 40 mg | ORAL | @ 13:00:00

## 2022-12-03 MED ADMIN — mycophenolate (CELLCEPT) capsule 250 mg: 250 mg | ORAL

## 2022-12-03 MED ADMIN — carvedilol (COREG) tablet 6.25 mg: 6.25 mg | ORAL

## 2022-12-03 MED ADMIN — fluticasone propionate (FLONASE) 50 mcg/actuation nasal spray 1 spray: 1 | NASAL | @ 13:00:00

## 2022-12-03 MED ADMIN — HYDROmorphone (DILAUDID) tablet 2 mg: 2 mg | ORAL | @ 08:00:00 | Stop: 2022-12-05

## 2022-12-03 MED ADMIN — carvedilol (COREG) tablet 6.25 mg: 6.25 mg | ORAL | @ 13:00:00

## 2022-12-03 MED ADMIN — mycophenolate (CELLCEPT) capsule 250 mg: 250 mg | ORAL | @ 13:00:00

## 2022-12-03 MED ADMIN — sevelamer (RENVELA) tablet 1,600 mg: 1600 mg | ORAL | @ 16:00:00

## 2022-12-03 MED ADMIN — insulin lispro (HumaLOG) injection 0-20 Units: 0-20 [IU] | SUBCUTANEOUS | @ 21:00:00

## 2022-12-03 MED ADMIN — insulin lispro (HumaLOG) injection 0-20 Units: 0-20 [IU] | SUBCUTANEOUS | @ 13:00:00

## 2022-12-03 MED ADMIN — spironolactone (ALDACTONE) tablet 25 mg: 25 mg | ORAL | @ 13:00:00

## 2022-12-03 MED ADMIN — heparin (porcine) 5,000 unit/mL injection 7,500 Units: 7500 [IU] | SUBCUTANEOUS | @ 09:00:00

## 2022-12-03 MED ADMIN — heparin (porcine) 5,000 unit/mL injection 7,500 Units: 7500 [IU] | SUBCUTANEOUS | @ 02:00:00

## 2022-12-03 MED ADMIN — tacrolimus (PROGRAF) capsule 1 mg: 1 mg | ORAL | @ 13:00:00

## 2022-12-03 MED ADMIN — heparin (porcine) 5,000 unit/mL injection 7,500 Units: 7500 [IU] | SUBCUTANEOUS | @ 18:00:00

## 2022-12-03 MED ADMIN — sevelamer (RENVELA) tablet 1,600 mg: 1600 mg | ORAL

## 2022-12-03 MED ADMIN — insulin lispro (HumaLOG) injection 35 Units: 35 [IU] | SUBCUTANEOUS | @ 21:00:00

## 2022-12-03 MED ADMIN — amlodipine (NORVASC) tablet 10 mg: 10 mg | ORAL | @ 13:00:00

## 2022-12-03 NOTE — Unmapped (Signed)
Patient alert and responsive with no distress noted.  Tolerated medications with no adverse reactions noted.  Patient denies pain and discomfort.  Bed in lowest position, callbell within reach and continue to monitor for safety.      Problem: Adult Inpatient Plan of Care  Goal: Plan of Care Review  Outcome: Progressing  Flowsheets (Taken 12/03/2022 0630)  Progress: improving  Goal: Absence of Hospital-Acquired Illness or Injury  Outcome: Progressing  Intervention: Identify and Manage Fall Risk  Recent Flowsheet Documentation  Taken 12/02/2022 2000 by Valeta Harms, RN  Safety Interventions:   environmental modification   fall reduction program maintained   lighting adjusted for tasks/safety   low bed  Intervention: Prevent and Manage VTE (Venous Thromboembolism) Risk  Recent Flowsheet Documentation  Taken 12/03/2022 0600 by Valeta Harms, RN  Anti-Embolism Intervention: (Heparin SQ) Other (Comment)  Taken 12/03/2022 0400 by Valeta Harms, RN  Anti-Embolism Intervention: (Heparin SQ) Other (Comment)  Taken 12/03/2022 0200 by Valeta Harms, RN  Anti-Embolism Intervention: (Heparin SQ) Other (Comment)  Taken 12/03/2022 0000 by Valeta Harms, RN  Anti-Embolism Intervention: (Heparin SQ) Other (Comment)  Taken 12/02/2022 2200 by Valeta Harms, RN  Anti-Embolism Intervention: (Heparin SQ) Other (Comment)  Taken 12/02/2022 2000 by Valeta Harms, RN  Anti-Embolism Intervention: (Heparin SQ) Other (Comment)  Intervention: Prevent Infection  Recent Flowsheet Documentation  Taken 12/02/2022 2000 by Valeta Harms, RN  Infection Prevention: environmental surveillance performed  Goal: Optimal Comfort and Wellbeing  Outcome: Progressing  Goal: Rounds/Family Conference  Outcome: Progressing     Problem: Fall Injury Risk  Goal: Absence of Fall and Fall-Related Injury  Outcome: Progressing  Intervention: Promote Injury-Free Environment  Recent Flowsheet Documentation  Taken 12/02/2022 2000 by Valeta Harms, RN  Safety Interventions: environmental modification   fall reduction program maintained   lighting adjusted for tasks/safety   low bed     Problem: Pain Acute  Goal: Optimal Pain Control and Function  Outcome: Progressing     Problem: Hemodialysis  Goal: Safe, Effective Therapy Delivery  Outcome: Progressing  Goal: Absence of Infection Signs and Symptoms  Intervention: Prevent or Manage Infection  Recent Flowsheet Documentation  Taken 12/02/2022 2000 by Valeta Harms, RN  Infection Management: aseptic technique maintained  Infection Prevention: environmental surveillance performed     Problem: Wound  Goal: Optimal Functional Ability  Outcome: Progressing  Goal: Absence of Infection Signs and Symptoms  Intervention: Prevent or Manage Infection  Recent Flowsheet Documentation  Taken 12/02/2022 2000 by Valeta Harms, RN  Infection Management: aseptic technique maintained  Goal: Skin Health and Integrity  Intervention: Optimize Skin Protection  Recent Flowsheet Documentation  Taken 12/03/2022 0400 by Valeta Harms, RN  Head of Bed [facility]) Positioning: HOB elevated  Taken 12/03/2022 0200 by Valeta Harms, RN  Head of Bed [facility]) Positioning: HOB elevated  Taken 12/03/2022 0000 by Valeta Harms, RN  Head of Bed [facility]) Positioning: HOB elevated  Taken 12/02/2022 2000 by Valeta Harms, RN  Head of Bed [facility]) Positioning: HOB elevated

## 2022-12-04 LAB — CBC
HEMATOCRIT: 26.8 % — ABNORMAL LOW (ref 39.0–48.0)
HEMOGLOBIN: 8.9 g/dL — ABNORMAL LOW (ref 12.9–16.5)
MEAN CORPUSCULAR HEMOGLOBIN CONC: 33.2 g/dL (ref 32.0–36.0)
MEAN CORPUSCULAR HEMOGLOBIN: 28.5 pg (ref 25.9–32.4)
MEAN CORPUSCULAR VOLUME: 85.8 fL (ref 77.6–95.7)
MEAN PLATELET VOLUME: 8.2 fL (ref 6.8–10.7)
PLATELET COUNT: 222 10*9/L (ref 150–450)
RED BLOOD CELL COUNT: 3.13 10*12/L — ABNORMAL LOW (ref 4.26–5.60)
RED CELL DISTRIBUTION WIDTH: 14.4 % (ref 12.2–15.2)
WBC ADJUSTED: 6.4 10*9/L (ref 3.6–11.2)

## 2022-12-04 LAB — BASIC METABOLIC PANEL
ANION GAP: 10 mmol/L (ref 5–14)
BLOOD UREA NITROGEN: 32 mg/dL — ABNORMAL HIGH (ref 9–23)
BUN / CREAT RATIO: 5
CALCIUM: 8.3 mg/dL — ABNORMAL LOW (ref 8.7–10.4)
CHLORIDE: 105 mmol/L (ref 98–107)
CO2: 28 mmol/L (ref 20.0–31.0)
CREATININE: 6.49 mg/dL — ABNORMAL HIGH
EGFR CKD-EPI (2021) MALE: 9 mL/min/{1.73_m2} — ABNORMAL LOW (ref >=60)
GLUCOSE RANDOM: 140 mg/dL — ABNORMAL HIGH (ref 70–99)
POTASSIUM: 4.1 mmol/L (ref 3.4–4.8)
SODIUM: 143 mmol/L (ref 135–145)

## 2022-12-04 LAB — TACROLIMUS LEVEL, TROUGH: TACROLIMUS, TROUGH: 5.2 ng/mL (ref 5.0–15.0)

## 2022-12-04 LAB — HEPATIC FUNCTION PANEL
ALBUMIN: 2.5 g/dL — ABNORMAL LOW (ref 3.4–5.0)
ALKALINE PHOSPHATASE: 211 U/L — ABNORMAL HIGH (ref 46–116)
ALT (SGPT): 29 U/L (ref 10–49)
AST (SGOT): 101 U/L — ABNORMAL HIGH (ref <=34)
BILIRUBIN DIRECT: 0.5 mg/dL — ABNORMAL HIGH (ref 0.00–0.30)
BILIRUBIN TOTAL: 0.9 mg/dL (ref 0.3–1.2)
PROTEIN TOTAL: 5.8 g/dL (ref 5.7–8.2)

## 2022-12-04 LAB — PHOSPHORUS: PHOSPHORUS: 7.3 mg/dL — ABNORMAL HIGH (ref 2.4–5.1)

## 2022-12-04 LAB — QUANTIFERON TB GOLD PLUS
QUANTIFERON ANTIGEN 1 MINUS NIL: 0.01 [IU]/mL
QUANTIFERON ANTIGEN 2 MINUS NIL: 0.01 [IU]/mL
QUANTIFERON MITOGEN: 0.06 [IU]/mL
QUANTIFERON TB GOLD PLUS: UNDETERMINED — AB
QUANTIFERON TB NIL VALUE: 0.01 [IU]/mL

## 2022-12-04 LAB — TB NIL: TB NIL VALUE: 0.01

## 2022-12-04 LAB — MAGNESIUM: MAGNESIUM: 2 mg/dL (ref 1.6–2.6)

## 2022-12-04 LAB — TB MITOGEN: TB MITOGEN VALUE: 0.07

## 2022-12-04 LAB — TB AG2: TB AG2 VALUE: 0.02

## 2022-12-04 LAB — TB AG1: TB AG1 VALUE: 0.02

## 2022-12-04 MED ORDER — HYDROMORPHONE 2 MG TABLET
ORAL_TABLET | Freq: Three times a day (TID) | ORAL | 0 refills | 4.00000 days | PRN
Start: 2022-12-04 — End: 2022-12-09

## 2022-12-04 MED ORDER — AMLODIPINE 10 MG TABLET
ORAL_TABLET | Freq: Every day | ORAL | 0 refills | 30.00000 days
Start: 2022-12-04 — End: ?

## 2022-12-04 MED ORDER — SEVELAMER CARBONATE 800 MG TABLET
ORAL_TABLET | Freq: Three times a day (TID) | ORAL | 0 refills | 30.00000 days
Start: 2022-12-04 — End: ?

## 2022-12-04 MED ORDER — INSULIN DETEMIR (U-100) 100 UNIT/ML (3 ML) SUBCUTANEOUS PEN
Freq: Every evening | SUBCUTANEOUS | 0 refills | 83.00000 days
Start: 2022-12-04 — End: ?

## 2022-12-04 MED ORDER — INSULIN ASPART (U-100) 100 UNIT/ML (3 ML) SUBCUTANEOUS PEN
Freq: Three times a day (TID) | SUBCUTANEOUS | 0 refills | 57.00000 days
Start: 2022-12-04 — End: ?

## 2022-12-04 MED ADMIN — HYDROmorphone (PF) injection Syrg 0.5 mg: .5 mg | INTRAVENOUS | @ 17:00:00 | Stop: 2022-12-04

## 2022-12-04 MED ADMIN — carvedilol (COREG) tablet 6.25 mg: 6.25 mg | ORAL | @ 13:00:00

## 2022-12-04 MED ADMIN — HYDROmorphone (DILAUDID) tablet 2 mg: 2 mg | ORAL | @ 05:00:00 | Stop: 2022-12-05

## 2022-12-04 MED ADMIN — tacrolimus (PROGRAF) capsule 1 mg: 1 mg | ORAL

## 2022-12-04 MED ADMIN — heparin (porcine) 5,000 unit/mL injection 7,500 Units: 7500 [IU] | SUBCUTANEOUS | @ 02:00:00

## 2022-12-04 MED ADMIN — mycophenolate (CELLCEPT) capsule 250 mg: 250 mg | ORAL | @ 13:00:00

## 2022-12-04 MED ADMIN — insulin lispro (HumaLOG) injection 35 Units: 35 [IU] | SUBCUTANEOUS | @ 20:00:00

## 2022-12-04 MED ADMIN — amlodipine (NORVASC) tablet 10 mg: 10 mg | ORAL | @ 13:00:00

## 2022-12-04 MED ADMIN — midazolam (VERSED) injection: INTRAVENOUS | @ 13:00:00 | Stop: 2022-12-04

## 2022-12-04 MED ADMIN — lidocaine (PF) (XYLOCAINE-MPF) 10 mg/mL (1 %) injection: INTRADERMAL | @ 14:00:00 | Stop: 2022-12-04

## 2022-12-04 MED ADMIN — Propofol (DIPRIVAN) injection: INTRAVENOUS | @ 14:00:00 | Stop: 2022-12-04

## 2022-12-04 MED ADMIN — sevelamer (RENVELA) tablet 1,600 mg: 1600 mg | ORAL | @ 20:00:00

## 2022-12-04 MED ADMIN — insulin glargine (LANTUS) injection 18 Units: 18 [IU] | SUBCUTANEOUS

## 2022-12-04 MED ADMIN — dexAMETHasone (DECADRON) 4 mg/mL injection: INTRAVENOUS | @ 14:00:00 | Stop: 2022-12-04

## 2022-12-04 MED ADMIN — insulin lispro (HumaLOG) injection 0-20 Units: 0-20 [IU] | SUBCUTANEOUS

## 2022-12-04 MED ADMIN — insulin lispro (HumaLOG) injection 0-20 Units: 0-20 [IU] | SUBCUTANEOUS | @ 17:00:00

## 2022-12-04 MED ADMIN — dexmedeTOMIDine (Precedex) 400 mcg in sodium chloride 0.9% 100 ml (4 mcg/mL) infusion PMB: INTRAVENOUS | @ 15:00:00 | Stop: 2022-12-04

## 2022-12-04 MED ADMIN — insulin lispro (HumaLOG) injection 0-20 Units: 0-20 [IU] | SUBCUTANEOUS | @ 20:00:00

## 2022-12-04 MED ADMIN — hydrOXYzine (ATARAX) tablet 25 mg: 25 mg | ORAL | @ 03:00:00

## 2022-12-04 MED ADMIN — HYDROmorphone (PF) injection Syrg 0.5 mg: .5 mg | INTRAVENOUS | @ 18:00:00 | Stop: 2022-12-04

## 2022-12-04 MED ADMIN — spironolactone (ALDACTONE) tablet 25 mg: 25 mg | ORAL | @ 13:00:00 | Stop: 2022-12-04

## 2022-12-04 MED ADMIN — pantoprazole (Protonix) EC tablet 40 mg: 40 mg | ORAL | @ 13:00:00

## 2022-12-04 MED ADMIN — mycophenolate (CELLCEPT) capsule 250 mg: 250 mg | ORAL

## 2022-12-04 MED ADMIN — ceFAZolin (ANCEF) injection: INTRAVENOUS | @ 14:00:00 | Stop: 2022-12-04

## 2022-12-04 MED ADMIN — carvedilol (COREG) tablet 6.25 mg: 6.25 mg | ORAL

## 2022-12-04 MED ADMIN — tacrolimus (PROGRAF) capsule 1 mg: 1 mg | ORAL | @ 13:00:00

## 2022-12-04 MED ADMIN — dexmedeTOMIDine (Precedex) 400 mcg in sodium chloride 0.9% 100 ml (4 mcg/mL) infusion PMB: INTRAVENOUS | @ 14:00:00 | Stop: 2022-12-04

## 2022-12-05 MED ORDER — INSULIN DETEMIR (U-100) 100 UNIT/ML (3 ML) SUBCUTANEOUS PEN
Freq: Every evening | SUBCUTANEOUS | 0 refills | 83.00000 days | Status: CP
Start: 2022-12-05 — End: ?
  Filled 2022-12-05: qty 15, 83d supply, fill #0

## 2022-12-05 MED ORDER — PANTOPRAZOLE 40 MG TABLET,DELAYED RELEASE
ORAL_TABLET | Freq: Every day | ORAL | 0 refills | 30.00000 days | Status: CP
Start: 2022-12-05 — End: ?

## 2022-12-05 MED ORDER — AMLODIPINE 10 MG TABLET
ORAL_TABLET | Freq: Every day | ORAL | 0 refills | 30.00000 days | Status: CP
Start: 2022-12-05 — End: ?
  Filled 2022-12-05: qty 30, 30d supply, fill #0

## 2022-12-05 MED ORDER — HYDROMORPHONE 2 MG TABLET
ORAL_TABLET | Freq: Three times a day (TID) | ORAL | 0 refills | 4.00000 days | Status: CP | PRN
Start: 2022-12-05 — End: 2022-12-10
  Filled 2022-12-05: qty 10, 4d supply, fill #0

## 2022-12-05 MED ORDER — INSULIN ASPART (U-100) 100 UNIT/ML (3 ML) SUBCUTANEOUS PEN
Freq: Three times a day (TID) | SUBCUTANEOUS | 0 refills | 57.00000 days | Status: CP
Start: 2022-12-05 — End: ?

## 2022-12-05 MED ORDER — SEVELAMER CARBONATE 800 MG TABLET
ORAL_TABLET | Freq: Three times a day (TID) | ORAL | 0 refills | 30.00000 days | Status: CP
Start: 2022-12-05 — End: ?
  Filled 2022-12-05: qty 180, 30d supply, fill #0

## 2022-12-05 MED ADMIN — HYDROmorphone (PF) injection Syrg 0.25 mg: .25 mg | INTRAVENOUS | @ 19:00:00 | Stop: 2022-12-05

## 2022-12-05 MED ADMIN — insulin lispro (HumaLOG) injection 0-20 Units: 0-20 [IU] | SUBCUTANEOUS | @ 11:00:00 | Stop: 2022-12-05

## 2022-12-05 MED ADMIN — carvedilol (COREG) tablet 6.25 mg: 6.25 mg | ORAL | @ 01:00:00

## 2022-12-05 MED ADMIN — gentamicin-sodium citrate lock solution in NS: 1.4 mL | @ 16:00:00 | Stop: 2022-12-05

## 2022-12-05 MED ADMIN — mycophenolate (CELLCEPT) capsule 250 mg: 250 mg | ORAL | @ 18:00:00 | Stop: 2022-12-05

## 2022-12-05 MED ADMIN — HYDROmorphone (DILAUDID) tablet 2 mg: 2 mg | ORAL | @ 03:00:00 | Stop: 2022-12-05

## 2022-12-05 MED ADMIN — ondansetron (ZOFRAN) injection 4 mg: 4 mg | INTRAVENOUS | @ 19:00:00 | Stop: 2022-12-05

## 2022-12-05 MED ADMIN — ondansetron (ZOFRAN-ODT) disintegrating tablet 4 mg: 4 mg | ORAL | @ 18:00:00 | Stop: 2022-12-05

## 2022-12-05 MED ADMIN — melatonin tablet 3 mg: 3 mg | ORAL | @ 03:00:00

## 2022-12-05 MED ADMIN — epoetin alfa-EPBX (RETACRIT) injection 3,000 Units: 3000 [IU] | SUBCUTANEOUS | @ 15:00:00 | Stop: 2022-12-05

## 2022-12-05 MED ADMIN — tacrolimus (PROGRAF) capsule 1 mg: 1 mg | ORAL | @ 18:00:00 | Stop: 2022-12-05

## 2022-12-05 MED ADMIN — amlodipine (NORVASC) tablet 10 mg: 10 mg | ORAL | @ 18:00:00 | Stop: 2022-12-05

## 2022-12-05 MED ADMIN — insulin glargine (LANTUS) injection 18 Units: 18 [IU] | SUBCUTANEOUS | @ 02:00:00

## 2022-12-05 MED ADMIN — insulin lispro (HumaLOG) injection 0-20 Units: 0-20 [IU] | SUBCUTANEOUS | @ 02:00:00

## 2022-12-05 MED ADMIN — insulin lispro (HumaLOG) injection 35 Units: 35 [IU] | SUBCUTANEOUS | @ 17:00:00 | Stop: 2022-12-05

## 2022-12-05 MED ADMIN — sevelamer (RENVELA) tablet 1,600 mg: 1600 mg | ORAL | @ 17:00:00 | Stop: 2022-12-05

## 2022-12-05 MED ADMIN — mycophenolate (CELLCEPT) capsule 250 mg: 250 mg | ORAL | @ 02:00:00

## 2022-12-05 MED ADMIN — insulin lispro (HumaLOG) injection 0-20 Units: 0-20 [IU] | SUBCUTANEOUS | @ 17:00:00 | Stop: 2022-12-05

## 2022-12-05 MED ADMIN — carvedilol (COREG) tablet 6.25 mg: 6.25 mg | ORAL | @ 18:00:00 | Stop: 2022-12-05

## 2022-12-05 NOTE — Unmapped (Signed)
[facility] Nephrology Hemodialysis Procedure Note     12/05/2022    Richard [name] was seen and examined on hemodialysis    CHIEF COMPLAINT: End Stage Renal Disease    INTERVAL HISTORY: S/p tunneled line placement yesterday. Tolerating HD well.   DIALYSIS TREATMENT DATA:  Estimated Dry Weight (kg):  (tbd) Patient Goal Weight (kg): 3 kg (6 lb 9.8 oz) (per Dr Deboraha Sprang)   Pre-Treatment Weight (kg): 135.7 kg (299 lb 2.6 oz)    Dialysis Bath  Bath: 3 K+ / 2.5 Ca+  Dialysate Na (mEq/L): 137 mEq/L  Dialysate HCO3 (mEq/L): 35 mEq/L Dialyzer: F-180 (98 mLs)   Blood Flow Rate (mL/min): 150 mL/min Dialysis Flow (mL/min): 800 mL/min   Machine Temperature (C): 36 ??C (96.8 ??F)      PHYSICAL EXAM:  Vitals:  Temp:  [36.7 ??C (98.1 ??F)-37.5 ??C (99.5 ??F)] 36.8 ??C (98.2 ??F)  Heart Rate:  [56-71] 68  BP: (119-159)/(66-90) 152/81  MAP (mmHg):  [75-108] 88    General: healthy, obese, currently dialyzing in a Hemodialysis Recliner  Pulmonary: normal respiratory effort  Cardiovascular: regular rate and rhythm  Extremities: 2+ edema bilaterally   Access: Right IJ tunneled catheter     LAB DATA:  Lab Results   Component Value Date    NA 143 12/04/2022    K 4.1 12/04/2022    CL 105 12/04/2022    CO2 28.0 12/04/2022    BUN 32 (H) 12/04/2022    CREATININE 6.49 (H) 12/04/2022    GLUCOSE 196 03/03/2012    CALCIUM 8.3 (L) 12/04/2022    MG 2.0 12/04/2022    PHOS 7.3 (H) 12/04/2022    ALBUMIN 2.5 (L) 12/04/2022      Lab Results   Component Value Date    HCT 26.8 (L) 12/04/2022    WBC 6.4 12/04/2022        ASSESSMENT/PLAN:  End Stage Renal Disease on Intermittent Hemodialysis:  UF goal: 3 - 4L as tolerated  Adjust medications for a GFR <10  Avoid nephrotoxic agents  Last HD Treatment:Started (12/05/22)     Bone Mineral Metabolism:  Lab Results   Component Value Date    CALCIUM 8.3 (L) 12/04/2022    CALCIUM 8.2 (L) 12/02/2022    Lab Results   Component Value Date    ALBUMIN 2.5 (L) 12/04/2022    ALBUMIN 2.5 (L) 12/02/2022      Lab Results   Component Value Date    PHOS 7.3 (H) 12/04/2022    PHOS 7.5 (H) 12/02/2022    Lab Results   Component Value Date    PTH 267.5 (H) 10/14/2021        Continue phosphorus binder   Anemia:   Lab Results   Component Value Date    HGB 8.9 (L) 12/04/2022    HGB 8.5 (L) 12/02/2022    HGB 8.5 (L) 12/01/2022    Iron Saturation (%)   Date Value Ref Range Status   11/09/2022 36 % Final   05/15/2012 39 20 - 50 % Final      Lab Results   Component Value Date    FERRITIN 169.8 11/09/2022       3000 units started 6/6  Receiving course of IV iron.     Vascular Access:  S/p TDC placement on 6/10. His AVF will require 2nd stage/superficialization,  BFR 400 ml/min     IV Antibiotics to be administered at discharge:  No    This procedure was fully reviewed with the  patient and/or their decision-maker. The risks, benefits, and alternatives were discussed prior to the procedure. All questions were answered and written informed consent was obtained.    Florian Buff, MD  [place] [facility] Hy[facility]

## 2022-12-05 NOTE — Unmapped (Signed)
Pt A&O x4, RA, up ad lib, VSS, ACHS, PRN meds administered per MAR. PIV clean, intact, and dry flushed per protocol. Call bell within reach, bed in lowest position, and bed locked. Care, comfort and safety maintained/ongoing.     Problem: Adult Inpatient Plan of Care  Goal: Plan of Care Review  Outcome: Ongoing - Unchanged  Flowsheets (Taken 12/05/2022 0003)  Plan of Care Reviewed With: patient  Goal: Patient-Specific Goal (Individualized)  Outcome: Ongoing - Unchanged  Goal: Absence of Hospital-Acquired Illness or Injury  Outcome: Ongoing - Unchanged  Intervention: Identify and Manage Fall Risk  Recent Flowsheet Documentation  Taken 12/04/2022 2000 by Gwynneth Albright, RN  Safety Interventions: fall reduction program maintained  Intervention: Prevent Skin Injury  Recent Flowsheet Documentation  Taken 12/04/2022 2000 by Gwynneth Albright, RN  Positioning for Skin: Sitting in Chair  Skin Protection: tubing/devices free from skin contact  Intervention: Prevent and Manage VTE (Venous Thromboembolism) Risk  Recent Flowsheet Documentation  Taken 12/04/2022 1941 by Gwynneth Albright, RN  VTE Prevention/Management: ambulation promoted  Intervention: Prevent Infection  Recent Flowsheet Documentation  Taken 12/04/2022 2000 by Gwynneth Albright, RN  Infection Prevention:   environmental surveillance performed   equipment surfaces disinfected   hand hygiene promoted   personal protective equipment utilized   rest/sleep promoted   single patient room provided  Goal: Optimal Comfort and Wellbeing  Outcome: Ongoing - Unchanged  Goal: Readiness for Transition of Care  Outcome: Ongoing - Unchanged  Goal: Rounds/Family Conference  Outcome: Ongoing - Unchanged     Problem: Fall Injury Risk  Goal: Absence of Fall and Fall-Related Injury  Outcome: Ongoing - Unchanged  Intervention: Promote Injury-Free Environment  Recent Flowsheet Documentation  Taken 12/04/2022 2000 by Gwynneth Albright, RN  Safety Interventions: fall reduction program maintained     Problem: Pain Acute  Goal: Optimal Pain Control and Function  Outcome: Ongoing - Unchanged     Problem: Hemodialysis  Goal: Safe, Effective Therapy Delivery  Outcome: Ongoing - Unchanged  Goal: Effective Tissue Perfusion  Outcome: Ongoing - Unchanged  Goal: Absence of Infection Signs and Symptoms  Outcome: Ongoing - Unchanged  Intervention: Prevent or Manage Infection  Recent Flowsheet Documentation  Taken 12/04/2022 2000 by Gwynneth Albright, RN  Infection Management: aseptic technique maintained  Infection Prevention:   environmental surveillance performed   equipment surfaces disinfected   hand hygiene promoted   personal protective equipment utilized   rest/sleep promoted   single patient room provided     Problem: Wound  Goal: Optimal Coping  Outcome: Ongoing - Unchanged  Goal: Optimal Functional Ability  Outcome: Ongoing - Unchanged  Intervention: Optimize Functional Ability  Recent Flowsheet Documentation  Taken 12/04/2022 2000 by Gwynneth Albright, RN  Activity Management: up ad lib  Goal: Absence of Infection Signs and Symptoms  Outcome: Ongoing - Unchanged  Intervention: Prevent or Manage Infection  Recent Flowsheet Documentation  Taken 12/04/2022 2000 by Gwynneth Albright, RN  Infection Management: aseptic technique maintained  Goal: Improved Oral Intake  Outcome: Ongoing - Unchanged  Goal: Optimal Pain Control and Function  Outcome: Ongoing - Unchanged  Goal: Skin Health and Integrity  Outcome: Ongoing - Unchanged  Intervention: Optimize Skin Protection  Recent Flowsheet Documentation  Taken 12/04/2022 2000 by Gwynneth Albright, RN  Activity Management: up ad lib  Pressure Reduction Techniques: frequent weight shift encouraged  Pressure Reduction Devices: pressure-redistributing mattress utilized  Skin Protection: tubing/devices free from skin contact  Goal: Optimal Wound Healing  Outcome: Ongoing - Unchanged

## 2022-12-05 NOTE — Unmapped (Signed)
Physician Discharge Summary [facility]  [facility]  [address]  [place] [address]  Dept: [phone]  Loc: [phone]     Identifying Information:   Richard [name]  1965/11/16  [id number]    Primary Care Physician: Mel Almond, MD     Code Status: Full Code    Admit Date: 11/15/2022    Discharge Date: 12/05/2022     Discharge To: Home    Discharge Service: [facility] - General Medicine Floor Team (MED Aggie Hacker)     Discharge Attending Physician: Dolores Patty, MD    Discharge Diagnoses:  Principal Problem:    ESRD (end stage renal disease) on dialysis (CMS-HCC) (POA: Not Applicable)  Active Problems:    Hypertension (POA: Yes)    Liver replaced by transplant (CMS-HCC) (POA: Not Applicable)    Immunosuppression due to drug therapy (CMS-HCC) (POA: Not Applicable)    (HFpEF) heart failure with preserved ejection fraction (CMS-HCC) (POA: Yes)    Severe hyperglycemia due to diabetes mellitus (CMS-HCC) (POA: Yes)    Cellulitis of right lower extremity (POA: Yes)    Acute kidney injury superimposed on chronic kidney disease (CMS-HCC) (POA: Yes)    CAD (coronary artery disease) (POA: Yes)    Elevated liver enzymes (POA: Yes)    Hyperphosphatemia (POA: Yes)  Resolved Problems:    Hypomagnesemia (POA: Yes)    Hypokalemia (POA: Yes)      Outpatient Provider Follow Up Issues:   [ ]  Vascular surgery re; Stage 2 of fistula  [ ]  PCP for BP and DM control    Hospital Course:   Richard [name] is a 57 y.o. male with PMH NASH cirrhosis s/p OLT 2013, HFpEF, CKD5, persistent A-fib not on AC, CAD, HTN, OSA not on CPAP who presented to [facility] 5/22 with RLE pain/redness and found to have cellulitis, an AKI on CKD and elevated LFTs.  He was evaluated by hepatology, nephrology and now completed tx for cellulitis and has started on HD given ESRD and will dc home with plan for outpatient nephrology, PCP and hepatology follow up.   Below are more details of his stay at [facility] according to problem:      AKI with underlying CKD G5A3 now with progressed to ESRD requiring HD  Baseline Cr 4.6-4.8. Follows with Nephrology (Dr. Galen Manila) and Transplant Nephrology (Dr. Margaretmary Bayley). 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 Cr elevated on admission at 5.33, continued to rise (7.46 5/27), but UOP remained appopriate.  Nephrology followed and patient appears euvolemic, urine with granular casts, and Cr not improved.  Diuretics held until he had episode of SOB with mild hypoxia and xray with pulm edema.  Torsemide started 5/28 and renal US showed no hydro.  Nephrology recs to get vein mapping and patient continued to worsening with fluid status and then had mild confusion indicating likely uremia.  Nephrology felt that he needed HD and non tunneled line placed and HD started.  Now improving with mental and fluid status.  Fistula placed at [facility] during stay and tunneled line placed prior to dc.  Will now transition to outpatient HD with chair established by CM team.  Will use phos binder and will also need 2nd stage of fistula creation and general surgery at [facility] will help arrange follow up.       Elevated LFTs, improved - Hx of Liver Transplant in 2013  Hx of liver transplant 11 years ago 2/2 NASH. On Cellcept and Tacrolimus. LFTs elevated  on admission and some concern for reject.  Hepatology followed and LFTs improved with diuresis and then HD making congestive hepatopathy most likely cause.  Continued on tac and cellcept.  Can restart statin.     RLE Cellulitis, improved  Patient with multiple episodes of cellulitis, presented with right lower extremity cellulitis.  Patient initially doing well but fevered with transition from vancomycin to doxycycline, broadened to linezolid and cefepime to provide MRSA and pseudomonal coverage.  Patient remains afebrile and hemodynamically stable.  Blood cultures no growth.  Has now completed antibiotics for 7-day course.     Uncontrolled T2DM  Latest A1c 8.3% on 08/04/2022. Pt uses a CGM and endorses well controlled BG with Levemir and Novolog at home.    Was continued on lantus and humalog with meals and adjusted based on BG.  Now is stable and improved and will plan to dc with 18U of basal and 35U bolus insulin with meals.  PCP for ongoing management and have encourage AC/HS monitoring at home and close follow up.      Chronic HFpEF - Hx of A-fib, CAD  Remains rate controlled.  Given diuresis as above and now on HD.  Have restarted coreg but will hold aldactone and lasix in setting of ESRD requiring HD.      Hypokalemia, stable - Hypomagnesemia resolved  Noted to have low potassium and magnesium levels during stay.  These were monitored and repleted as needed.  Now improved.      Chronic Conditions  Hypertension: Continue amlodipine 10 mg, Coreg 6.25 mg twice daily      Touchbase with Outpatient Provider:  Warm Handoff: Completed on 12/05/22 by Mervin Kung, ANP  (APP) via Epic Secure Chat    Procedures:  HD line, fistula placement  No admission procedures for hospital encounter.  ______________________________________________________________________  Discharge Medications:     Your Medication List        STOP taking these medications      furosemide 40 MG tablet  Commonly known as: LASIX     spironolactone 25 MG tablet  Commonly known as: ALDACTONE            START taking these medications      HYDROmorphone 2 MG tablet  Commonly known as: DILAUDID  Take 1 tablet (2 mg total) by mouth every eight (8) hours as needed for severe pain.     pantoprazole 40 MG tablet  Commonly known as: Protonix  Take 1 tablet (40 mg total) by mouth daily.     sevelamer 800 mg tablet  Commonly known as: RENVELA  Take 2 tablets (1,600 mg total) by mouth Three (3) times a day with a meal.     TRUEPLUS PEN NEEDLE 32 gauge x 5/32 (4 mm) Ndle  Generic drug: pen needle, diabetic  Use with insulin up to 4 times a day as needed.            CHANGE how you take these medications      amlodipine 10 MG tablet  Commonly known as: NORVASC  Take 1 tablet (10 mg total) by mouth daily.  What changed:   medication strength  how much to take     insulin aspart 100 unit/mL (3 mL) injection pen  Commonly known as: NovoLOG FLEXPEN  Inject 0.35 mL (35 Units total) under the skin Three (3) times a day before meals.  What changed: how much to take     insulin detemir U-100 100 unit/mL (3 mL) injection  pen  Commonly known as: LEVEMIR  Inject 0.18 mL (18 Units total) under the skin nightly.  What changed: how much to take            CONTINUE taking these medications      atorvastatin 40 MG tablet  Commonly known as: LIPITOR  Take 1 tablet (40 mg total) by mouth nightly. Med to prevent heart attacks and help cholesterol     carvedilol 6.25 MG tablet  Commonly known as: COREG  Take 1 tablet (6.25 mg total) by mouth two (2) times a day.     DEXCOM G6 RECEIVER Misc  Generic drug: blood-glucose meter,continuous  1 each by Miscellaneous route in the morning. Dispense 1 receiver annually.  Sent to [facility].     DEXCOM G6 SENSOR Devi  Generic drug: blood-glucose sensor  Use as directed to monitor blood glucose     DEXCOM G6 SENSOR Devi  Generic drug: blood-glucose sensor  Use to check blood glucose and change every 10 days.     DEXCOM G6 TRANSMITTER Devi  Generic drug: blood-glucose transmitter  Use as directed to monitor blood glucose     DEXCOM G6 TRANSMITTER Devi  Generic drug: blood-glucose transmitter  1 each by Miscellaneous route Every three (3) months. [facility]     magnesium oxide 400 mg (241.3 mg elemental) tablet  Commonly known as: MAG-OX  Take 1 tablet (400 mg total) by mouth daily.     multivitamin per tablet  Commonly known as: TAB-A-VITE/THERAGRAN  Take 1 tablet by mouth daily.     mycophenolate 250 mg capsule  Commonly known as: CELLCEPT  Take 1 capsule (250 mg total) by mouth two (2) times a day.     PROGRAF 0.5 mg capsule  Generic drug: tacrolimus  Take 2 capsules (1 mg total) by mouth two (2) times a day.     sildenafil 50 MG tablet  Commonly known as: VIAGRA  Take 1 tablet (50 mg total) by mouth daily as needed for erectile dysfunction.     VITAMIN D3 ORAL  Take 2,000 Units by mouth daily.            ASK your doctor about these medications      cephalexin 500 MG capsule  Commonly known as: KEFLEX  Take 1 capsule (500 mg total) by mouth four (4) times a day for 7 days.  Ask about: Should I take this medication?     sulfamethoxazole-trimethoprim 800-160 mg per tablet  Commonly known as: BACTRIM DS  Take 1 tablet (160 mg of trimethoprim total) by mouth two (2) times a day for 7 days.  Ask about: Should I take this medication?              Allergies:  Patient has no known allergies.  ______________________________________________________________________  Pending Test Results (if blank, then none):      Most Recent Labs:  All lab results last 24 hours -   Recent Results (from the past 24 hour(s))   POCT Glucose    Collection Time: 12/04/22 12:45 PM   Result Value Ref Range    Glucose, POC 211 (H) 70 - 179 mg/dL   POCT Glucose    Collection Time: 12/04/22  4:20 PM   Result Value Ref Range    Glucose, POC 275 (H) 70 - 179 mg/dL   POCT Glucose    Collection Time: 12/04/22  8:57 PM   Result Value Ref Range    Glucose, POC 350 (H) 70 -  179 mg/dL   POCT Glucose    Collection Time: 12/05/22  7:09 AM   Result Value Ref Range    Glucose, POC 309 (H) 70 - 179 mg/dL     Microbiology -   Microbiology Results (last day)       ** No results found for the last 24 hours. **            Relevant Studies/Radiology (if blank, then none):  No results found.  ______________________________________________________________________  Discharge Instructions:   Activity Instructions       Activity as tolerated     Additional Activity Instructions:    You received sedation today.  Please follow these instructions:  Rest for the next 24 hours.  Do not drive a car, operate heavy machinery, drink alcohol or sign legal documents for 24 hours.  Avoid strenuous activity and heavy lifting (above 10 pounds) for 1 week.    Catheter insertion site should be covered with a sterile, occlusive dressing at all times.  Do not get dressing or catheter site wet.  Do not submerge line in water (no swimming pools, tub soaks, oceans, lakes, etc...)  Line should always have a clave on the end.   Line should be clamped when not in use.  Infusion nurse will perform care, flushing, dressing changes, and teaching.  When showering, cover the site with a waterproof dressing.    You may remove the small dressing on the side of your neck after 48 hours.    Keep covered with a bandaid until healed.     Please call [facility] VIR clinic at [phone] and choose option 3 to speak to a nurse for any problems or questions following discharge including:    Bleeding that saturates bandage  Pain at site not controlled by regular pain medicines  Signs of infection at site: redness, swelling, pus draining, fever above 101.5, shakes/chills  If site bleeds, sit upright and hold pressure to area.    Calls received during normal business hours (Monday through Friday from 8:00 am to 4:30 pm) will be returned within one business day. Calls received after normal business hours OR on weekends will be returned during the next business day.     If this is an after-hours urgent matter, you can call the Hospital front desk at [phone] and ask to speak with the Interventional Radiology (VIR) doctor on call.    If you are experiencing a medical emergency, please call 911 or go to your nearest emergency room.                  Diet Instructions       Discharge diet (specify)      Discharge Nutrition Therapy: Heart Healthy    We recommend that you stick to a lower sodium (less than 2 grams/day of salt) diet to help you keep fluid from building up in your legs, lungs, and abdomen.                Other Instructions       Call MD for:  difficulty breathing, headache or visual disturbances      Call MD for:  persistent nausea or vomiting      Call MD for: severe uncontrolled pain      Call MD for: Temperature > 38.5 Celsius ( > 101.3 Fahrenheit)      Discharge instructions      ARTERIOVENOUS FISTULA (AV FISTULA)    You had a procedure called an  arteriovenous fistula created in your arm. This was done through one incision which was closed with dissolvable stitches. It is covered with an adhesive strip, gauze, and a plastic dressing over top. You may keep this dressing on until we see you in 2 weeks. The dressing is water resistant. You may shower with this dressing. Please avoid submersing your fistula arm in water, so do NOT take a bath, swim in a pool, wash dishes, etc. while this is healing.      The block will last for 12-24 hours, and then you may feel general post-operative pain.      You may have swelling at this fistula arm for the first 2-3 weeks.      Please avoid lifting over 15lbs, pushing, or pulling over 15lbs, or overhead activity until we can see you in follow up.    Do not place any pressure such as belts, braces, or tight fabric around your fistula arm. Avoid wearing rings on your fistula arm at this time. Do not keep your fistula arm bent for any extended period of time.    Do not allow your blood pressure to be checked at this fistula arm as well.    Please require all blood work to be taken from your non-fistula arm at this time.    Do not pick any scabs on your fistula arm. This may cause infection.    Avoid sleeping on your fistula arm.         You must know how to look after your fistula at home.  You should check your fistula daily to make sure that it is working.  This is done by placing your fingers on the skin over your fistula. You should feel a vibration ('thrill').       Exercising your arm in which the fistula is created can help it to develop more quickly. You may do this by taking a spongy ball in your hand and squeeze the ball repeatedly for five to 10 minutes. Repeat this two to three times a day. If your arm becomes tired or painful during the exercises, stop and rest it.         We would like to see you in our clinic in about 2 weeks.  Please schedule your follow-up appointment by calling the phone numbers below if you have not heard of your general surgery follow up appointment:    Scheduling: [phone]    RN / Clinic: [phone]      Please contact the general surgery clinic office at [phone] if:    -Your incision becomes red, warm, or drains pus    -You develop a temperature higher than 101F    -Your bandage is soaked with blood; it is normal to experience some bleeding after surgery.    -The 'thrill' of the fistula goes away.    -Your fingers become numb, painful, and/or blue.       If you have a non-emergency regarding your surgery, please call our clinic RN at [phone]. If you have an emergency during regular business hours regarding your surgery, please go to your nearest Emergency Department AND you may call our clinic RN at [phone] OR call the hospital operator at [phone] and ask for the [facility] General Surgeon On-Call: Attending Consultant. The On-Call Surgeon will be paged and will call you back. If you have an emergency outside of regular business hours regarding your surgery, please go to your nearest Emergency Department.  Discharge instructions      Mr. Novakovic, you were admitted to [facility] due to possible infection in your skin/soft tissue of your leg and have completed antibiotics to treat that.     You were also found to have worsening kidney injury and now requiring dialysis.  We recommend that you follow up with all schedule dialysis sessions and attend for the entire time.     Note the addition of phoslo (calcium acetate) to help keep your blood phospherous levels lower.    Note that you should stop taking aldactone (spironalactone) and lasix (furosemide) and talk to your PCP and nephrology teams about if these are needed in the future.    You can use acetaminophen for mild pain that is associated with the new dialysis catheter and hydromorphone for severe pain.  Note that your pain should improve over the coming days.    You should have another appointment to address the completed process of the fistula.  You should receive a call about a time and date for the appt but if you have questions about the appointment then call  [phone]    Antibiotics were sent to your local pharmacy when you were in the ED but you DO NOT NEED TO TAKE cephalexin or bactrim as you have completed antibiotics    Continue to check your blood sugars at home before meals and at bedtime.  Note the change in your dose of insulin for now and be sure to follow up with your PCP about best treatment options.    Follow up with your PCP in the next 1-2 weeks and be sure to take your blood glucose monitor, your current medications and a list of questions so your providers can better guide your care.                 Follow Up instructions and Outpatient Referrals     Ambulatory referral to General Surgery      Call MD for:  difficulty breathing, headache or visual disturbances      Call MD for:  persistent nausea or vomiting      Call MD for:  severe uncontrolled pain      Call MD for: Temperature > 38.5 Celsius ( > 101.3 Fahrenheit)      Discharge instructions      Discharge instructions          Appointments which have been scheduled for you      Dec 19, 2022 11:30 AM  (Arrive by 11:15 AM)  RETURN  GENERAL with Dyann Ruddle, MD  [facility] GENERAL AND BARIATRIC SURGERY [facility] REGION) [address]  [address]nd Floor  [place] [address]  [phone]        Dec 22, 2022 9:00 AM  (Arrive by 8:45 AM)  RETURN VIDEO HCP MYCHART with Philomena Course, RD/LDN  [facility] BARIATRIC NUTRITION SERVICES [facility] REGION) [address]  [address]  [place] [address]  [phone]   Please sign into My [place] Chart at least 15 minutes before your appointment to complete the eCheck-In process. You must complete eCheck-In before you can start your video visit. We also recommend testing your audio and video connection to troubleshoot any issues before your visit begins. Click ???Join Video Visit??? to complete these checks. Once you have completed eCheck-In and tested your audio and video, click ???Join Call??? to connect to your visit.     For your video visit, you will need a  computer with a working Warden/ranger, speaker and microphone, a smartphone, or a tablet with internet access.    My [place] Chart enables you to manage your health, send non-urgent messages to your provider, view your test results, schedule and manage appointments, and request prescription refills securely and conveniently from your computer or mobile device.    You can go to [email or web address] to sign in to your My [place] Chart account with your username and password. If you have forgotten your username or password, please choose the ???Forgot Username???? and/or ???Forgot Password???? links to gain access. You also can access your My [place] Chart account with the free MyChart mobile app for Android or iPhone.    If you need assistance accessing your My [place] Chart account or for assistance in reaching your provider's office to reschedule or cancel your appointment, please call [facility] [phone].         Jan 02, 2023  UGI ENDOSCOPY; WITH BIOPSY, SINGLE OR MULTIPLE with Chriss Driver, MD  GI PERIOP MMNT [facility] REGION) [address]  [address]  [place] [address]  [phone]     Jan 15, 2023 2:30 PM  (Arrive by 2:00 PM)  RETURN  HEPATOLOGY with Linnell Fulling, ANP  [facility] LIVER TRANSPLANT [facility] REGION) [address]  [place] [address]  [phone]             ______________________________________________________________________  Discharge Day Services:  BP 144/83  - Pulse 63  - Temp 36.8 ??C (98.2 ??F) (Oral)  - Resp 18  - Ht 177.8 cm (5' 10)  - Wt (!) 138.8 kg (306 lb)  - SpO2 98%  - BMI 43.91 kg/m??     Pt seen on the day of discharge and determined appropriate for discharge.    Condition at Discharge: stable    Length of Discharge: I spent greater than 30 mins in the discharge of this patient.

## 2022-12-05 NOTE — Unmapped (Signed)
HEMODIALYSIS NURSE PROCEDURE NOTE    Treatment Number:  7 Room/Station:  7 Procedure Date:  12/05/22   Total Treatment Time:  243 Min.    CONSENT:  Written consent was obtained prior to the procedure and is detailed in the medical record. Prior to the start of the procedure, a time out was taken and the identity of the patient was confirmed via name, medical record number and date of birth.     WEIGHTS:  Hemodialysis Pre-Treatment Weights        Date/Time Pre-Treatment Weight (kg) Estimated Dry Weight (kg) Patient Goal Weight (kg) Total Goal Weight (kg)    12/05/22 0756 135.7 kg (299 lb 2.6 oz)  --  tbd  1.5 kg (3 lb 4.9 oz)  2.055 kg (4 lb 8.5 oz)                      Hemodialysis Post Treatment Weights        Date/Time Post-Treatment Weight (kg) Treatment Weight Change (kg)    12/05/22 1217 132 kg (291 lb 0.1 oz)  -3.71 kg                   Active Dialysis Orders (168h ago, onward)       Start     Ordered    12/05/22 0826  Hemodialysis inpatient  Every Tue,Thu,Sat      Question Answer Comment   Patient HD Status: Chronic    New Start? Yes    K+ 3 meq/L    Ca++ 2.5 meq/L    Bicarb 35 meq/L    Na+ 137 meq/L    Na+ Modeling no    Dialyzer [id number]    Dialysate Temperature (C) 36    BFR-As tolerated to a maximum of: 400 mL/min see comments   DFR 800 mL/min see comments   Duration of treatment 4 Hr see comments   Dry weight (kg) TBD    Challenge dry weight (kg) yes    Fluid removal (L) 3L 6/11    Tubing Adult = 142 ml    Access Site Dialysis Catheter    Access Site Location Right    Keep SBP >: 100        12/05/22 0825    11/25/22 0919  Hemodialysis inpatient  Every Tue,Thu,Sat,   Status:  Canceled      Question Answer Comment   Patient HD Status: Chronic    New Start? Yes    K+ 3 meq/L    Ca++ 2.5 meq/L    Bicarb 35 meq/L    Na+ 137 meq/L    Na+ Modeling no    Dialyzer [id number]    Dialysate Temperature (C) 36    BFR-As tolerated to a maximum of: 400 mL/min see comments   DFR 800 mL/min see comments   Duration of treatment 4 Hr see comments   Dry weight (kg) TBD    Challenge dry weight (kg) yes    Fluid removal (L) 1.5L on 11/25/22    Tubing Adult = 142 ml    Access Site Dialysis Catheter    Access Site Location Right    Keep SBP >: 100        11/25/22 0918                  ACCESS SITE:       Hemodialysis Catheter 12/04/22 Arteriovenous catheter Left Internal jugular (Active)   Site Assessment Clean;Dry;Intact 12/05/22 1219   Proximal  Lumen Status / Patency Blood Return - Brisk 12/05/22 1219   Proximal Lumen Intervention Deaccessed 12/05/22 1219   Medial Lumen Status / Patency Blood Return - Brisk 12/05/22 1219   Medial Lumen Intervention Deaccessed 12/05/22 1219   Dressing Intervention New dressing 12/05/22 1219   Dressing Status      Clean;Dry;Intact/not removed 12/05/22 1219   Verification by X-ray Yes 12/05/22 1219   Site Condition No complications 12/05/22 1219   Dressing Type CHG gel 12/05/22 1219   Dressing Change Due 12/12/22 12/05/22 1219   Line Necessity Reviewed? Y 12/05/22 1219   Line Necessity Indications Yes - Hemodialysis 12/05/22 1219   Line Necessity Reviewed With MDU 12/05/22 1219     Hemodialysis Catheter With Distal Infusion Port 11/23/22 Right Internal jugular 1.4 mL 1.4 mL (Active)   Site Assessment Clean;Dry;Intact 12/04/22 0820   Proximal Lumen Status / Patency Gentamicin Citrate Locked 12/02/22 1212   Proximal Lumen Intervention Deaccessed 12/02/22 2015   Medial Lumen Status / Patency Gentamicin Citrate Locked 12/02/22 1212   Medial Lumen Intervention Deaccessed 12/02/22 2015   Lumen 3, Distal Status / Patency Blood Return - Brisk 12/04/22 0820   Distal Lumen Intervention Flushed 12/02/22 2015   Dressing Type CHG gel;Occlusive 12/03/22 0800   Dressing Status      Clean;Dry;Intact/not removed 12/03/22 0800   Dressing Intervention No intervention needed 12/02/22 2015   Contraindicated due to: Dressing Intact surrounding insertion site 11/30/22 1258   Dressing Change Due 12/06/22 12/04/22 0820   Line Necessity Reviewed? Y 12/03/22 0800   Line Necessity Indications Yes - Hemodialysis 12/04/22 0820   Line Necessity Reviewed With MED U 12/02/22 1212     Arteriovenous Fistula - Vein Graft  Access 11/29/22 1216 Arteriovenous fistula Right Forearm (Active)   Site Assessment Clean;Intact;Dry 12/05/22 0759   AV Fistula Thrill Present;Bruit Present 12/05/22 0759   Status Deaccessed 12/05/22 0759   Dressing Intervention New dressing 11/29/22 1200   Dressing Gauze;Occlusive 11/29/22 1200     Catheter Fill Volumes:  Arterial:  2.1 mL Venous:  2.1 mL   Catheter filled with  4% mg Gentamicin Citrate post procedure.    Patient Lines/Drains/Airways Status       Active Peripheral & Central Intravenous Access       Name Placement date Placement time Site Days    Peripheral IV 12/04/22 Left Hand 12/04/22  0945  Hand  1    Hemodialysis Catheter With Distal Infusion Port 11/23/22 Right Internal jugular 1.4 mL 1.4 mL 11/23/22  1324  Internal jugular  11                  LAB RESULTS:  Lab Results   Component Value Date    NA 143 12/04/2022    K 4.1 12/04/2022    CL 105 12/04/2022    CO2 28.0 12/04/2022    BUN 32 (H) 12/04/2022    CREATININE 6.49 (H) 12/04/2022    GLU 140 (H) 12/04/2022    GLUF 138 10/06/2014    CALCIUM 8.3 (L) 12/04/2022    CAION 4.57 11/20/2022    ICAV 4.88 03/04/2012    PHOS 7.3 (H) 12/04/2022    MG 2.0 12/04/2022    PTH 267.5 (H) 10/14/2021    IRON 95 11/09/2022    LABIRON 36 11/09/2022    TRANSFERRIN 212.0 (L) 11/09/2022    FERRITIN 169.8 11/09/2022    TIBC 267.1 11/09/2022     Lab Results   Component Value Date  WBC 6.4 12/04/2022    HGB 8.9 (L) 12/04/2022    HCT 26.8 (L) 12/04/2022    PLT 222 12/04/2022    PHART 7.36 03/04/2012    PO2ART 88 03/04/2012    PCO2ART 43 03/04/2012    HCO3ART 23.4 03/04/2012    BEART -1.3 03/04/2012    O2SATART 98.1 03/04/2012    PTINR : 05/06/2012    APTT 26.1 04/24/2022        VITAL SIGNS:  Temperature        Date/Time Temp Temp src       12/05/22 1304 36.7 ??C (98.1 ??F)  Oral 12/05/22 1214 36.6 ??C (97.9 ??F)  Oral                    Hemodynamics        Date/Time Pulse BP MAP (mmHg) Patient Position    12/05/22 1304 70  148/83  103  Sitting     12/05/22 1215 65  146/85  --  Lying     12/05/22 1214 64  152/87  --  Lying     12/05/22 1200 61  135/70  --  Lying     12/05/22 1130 61  148/87  --  Lying     12/05/22 1100 63  144/83  --  Lying     12/05/22 1030 67  143/76  --  Lying     12/05/22 1000 66  141/68  --  Lying     12/05/22 0945 69  122/70  --  Lying     12/05/22 0930 72  181/79  --  Lying                   Blood Volume Monitor        Date/Time Blood Volume Change (%) HCT HGB Critline O2 SAT %    12/05/22 1215 -6.3 %  30  10.2  70.2     12/05/22 1214 -7.2 %  30.3  10.3  71.9     12/05/22 1200 -8.2 %  30.6  10.4  71.6     12/05/22 1130 -6.4 %  30  10.2  73.5     12/05/22 1100 -5.9 %  29.9  10.2  72     12/05/22 1030 -5.1 %  29.6  10.1  71.7     12/05/22 1000 -4.6 %  29.5  10  68.6     12/05/22 0945 -4.1 %  29.3  10  66.9     12/05/22 0930 -4.3 %  29.3  10  71.9                   Oxygen Therapy        Date/Time Resp SpO2 O2 Device FiO2 (%) O2 Flow Rate (L/min)    12/05/22 1304 18  100 %  None (Room air)  -- --     12/05/22 1215 17  --  --  -- --     12/05/22 1214 18  --  None (Room air)  -- --     12/05/22 1200 18  --  None (Room air)  -- --     12/05/22 1130 18  --  None (Room air)  -- --     12/05/22 1100 18  --  None (Room air)  -- --     12/05/22 1030 18  --  None (Room air)  -- --     12/05/22 1000 18  --  None (Room air)  -- --     12/05/22 0945 18  --  --  -- --     12/05/22 0930 18  --  None (Room air)  -- --                   Oxygen Connected to Wall:  no    Pre-Hemodialysis Assessment        Date/Time Therapy Number Dialyzer All Machine Alarms Passed Air Detector Dialysis Flow (mL/min)    12/05/22 0756 7  F-180 (98 mLs)  Yes  Engaged  800 mL/min       Date/Time Verify Priming Solution Priming Volume Hemodialysis Independent pH Hemodialysis Machine Conductivity (mS/cm) Hemodialysis Independent Conductivity (mS/cm)    12/05/22 0756 0.9% NS  300 mL  --  13.6 mS/cm  13.7 mS/cm       Date/Time Bicarb Conductivity Residual Bleach Negative Free Chlorine Total Chlorine Chloramine    12/05/22 0756 -- Yes  -- 0  --                  Pre-Hemodialysis Treatment Comments        Date/Time Pre-Hemodialysis Comments    12/05/22 0756 alert                   Hemodialysis Treatment        Date/Time Blood Flow Rate (mL/min) Arterial Pressure (mmHg) Venous Pressure (mmHg) Transmembrane Pressure (mmHg)    12/05/22 1215 155 mL/min  9 mmHg  55 mmHg  --     12/05/22 1214 150 mL/min  -20 mmHg  30 mmHg  2 mmHg     12/05/22 1200 380 mL/min  -197 mmHg  137 mmHg  23 mmHg     12/05/22 1130 400 mL/min  -210 mmHg  146 mmHg  30 mmHg     12/05/22 1100 400 mL/min  -206 mmHg  149 mmHg  30 mmHg     12/05/22 1030 400 mL/min  -206 mmHg  153 mmHg  29 mmHg     12/05/22 1000 390 mL/min  -194 mmHg  151 mmHg  33 mmHg     12/05/22 0945 390 mL/min  -199 mmHg  150 mmHg  34 mmHg     12/05/22 0930 390 mL/min  -230 mmHg  146 mmHg  37 mmHg     12/05/22 0900 390 mL/min  -228 mmHg  136 mmHg  34 mmHg     12/05/22 0830 400 mL/min  -129 mmHg  150 mmHg  56 mmHg     12/05/22 0811 150 mL/min  -20 mmHg  20 mmHg  10 mmHg       Date/Time Ultrafiltration Rate (mL/hr) Ultrafiltrate Removed (mL) Dialysate Flow Rate (mL/min) KECN (Kecn)    12/05/22 1215 0 mL/hr  3550 mL  0 ml/min  --     12/05/22 1214 1250 mL/hr  3550 mL  0 ml/min  --     12/05/22 1200 940 mL/hr  3295 mL  800 ml/min  --     12/05/22 1130 920 mL/hr  2856 mL  800 ml/min  --     12/05/22 1100 920 mL/hr  2400 mL  800 ml/min  --     12/05/22 1030 920 mL/hr  1948 mL  800 ml/min  --     12/05/22 1000 910 mL/hr  1506 mL  800 ml/min  --     12/05/22 0945 910 mL/hr  1281 mL  800 ml/min  --  12/05/22 0930 910 mL/hr  1058 mL  800 ml/min  --     12/05/22 0900 910 mL/hr  607 mL  800 ml/min  --     12/05/22 0830 910 mL/hr  179 mL  800 ml/min  --     12/05/22 0811 510 mL/hr  0 mL  800 ml/min --                   Hemodialysis Treatment Comments        Date/Time Intra-Hemodialysis Comments    12/05/22 1214 Time completed.Rinseback.VSS     12/05/22 1200 Alert.No c/o.     12/05/22 1130 Stable.     12/05/22 1100 VSS.     12/05/22 1030 No c/o.     12/05/22 1000 Alert.No c/o.     12/05/22 0930 Stable.     12/05/22 0900 VSS>     12/05/22 0830 Seen by Dr Meredith Mody and increased UF net goal to 3liters as ordered.     12/05/22 0811 HD started per protocol.                   Post Treatment        Date/Time Rinseback Volume (mL) On Line Clearance: spKt/V Total Liters Processed (L/min) Dialyzer Clearance    12/05/22 1217 300 mL  0.76 spKt/V  85.5 L/min  Moderately streaked                     Post Hemodialysis Treatment Comments        Date/Time Post-Hemodialysis Comments    12/05/22 1217 Stable                   POST TREATMENT ASSESSMENT:  General appearance:  alert  Neurological:  Mental status: Alert, oriented, thought content appropriate  Lungs:   -  Hearts:  S1, S2 normal  Abdomen:  soft, non-tender; bowel sounds normal; no masses,  no organomegaly  Pulses:   -  Skin:  Skin color, texture, turgor normal. No rashes or lesions    Hemodialysis I/O        Date/Time Total Hemodialysis Replacement Volume (mL) Total Ultrafiltrate Output (mL)    12/05/22 1217 --  3000 mL                   [id number] - Medicaitons Given During Treatment  (last 4 hrs)           Dola Argyle, RN         Medication Name Action Time Action Route Rate Dose User     epoetin alfa-EPBX (RETACRIT) injection 3,000 Units 12/05/22 1125 Given Subcutaneous  3,000 Units Dola Argyle, RN     gentamicin-sodium citrate lock solution in NS 12/05/22 1210 Given Intra-cannular  1.4 mL Dola Argyle, RN     gentamicin-sodium citrate lock solution in NS 12/05/22 1210 Given Intra-cannular  1.4 mL Dola Argyle, RN            Ileene Rubens, RN         Medication Name Action Time Action Route Rate Dose User     diclofenac sodium (VOLTAREN) 1 % gel 2 g 12/05/22 1303 Refused Topical  2 g Ileene Rubens, RN     fluticasone propionate (FLONASE) 50 mcg/actuation nasal spray 1 spray 12/05/22 1303 Refused Each Nare  1 spray Oluoch, Justice Rocher, RN     insulin lispro (HumaLOG) injection 0-20 Units 12/05/22 1259 Given Subcutaneous  3 Units Ileene Rubens, RN     insulin lispro (HumaLOG) injection 35 Units 12/05/22 1259 Given Subcutaneous  35 Units Ileene Rubens, RN     lidocaine 4 % patch 1 patch 12/05/22 1303 Refused Transdermal  1 patch Ileene Rubens, RN     pantoprazole (Protonix) EC tablet 40 mg 12/05/22 1307 Refused Oral  40 mg Ileene Rubens, RN     polyethylene glycol (MIRALAX) packet 17 g 12/05/22 1307 Refused Oral  17 g Ileene Rubens, RN     sevelamer (RENVELA) tablet 1,600 mg 12/05/22 1307 Given Oral  1,600 mg Ileene Rubens, RN                      Patient tolerated treatment in a  Dialysis Recliner.

## 2022-12-05 NOTE — Unmapped (Signed)
Treatment time is 4hrs and fluid removal went up to 3liters as ordered.  Problem: Hemodialysis  Goal: Safe, Effective Therapy Delivery  Outcome: Ongoing - Unchanged

## 2022-12-05 NOTE — Unmapped (Signed)
Hospital Medicine Daily Progress Note    Assessment/Plan:    Principal Problem:    ESRD (end stage renal disease) on dialysis (CMS-HCC)  Active Problems:    Hypertension    Liver replaced by transplant (CMS-HCC)    Immunosuppression due to drug therapy (CMS-HCC)    (HFpEF) heart failure with preserved ejection fraction (CMS-HCC)    Severe hyperglycemia due to diabetes mellitus (CMS-HCC)    Cellulitis of right lower extremity    Acute kidney injury superimposed on chronic kidney disease (CMS-HCC)    CAD (coronary artery disease)    Elevated liver enzymes    Hyperphosphatemia  Resolved Problems:    Hypomagnesemia    Hypokalemia        Please see wound care flowsheets for wound documentation     LOS: 18 days    Richard [name] is a 57 y.o. man with MASLD cirrhosis s/p OLT (2013), HFpEF, HTN, CAD, persistent Afib, T2DM on insulin, and CKD5 who was admitted for cellulitis of the right leg, now treated. Course complicated by AKI and progression to ESRD, requiring initiation of hemodialysis. Last barrier to discharge was durable HD access which is now in place.    # ESRD on iHD (TTS)  Secured HD chair starting 12/05/2022 at [facility] [place] location. Has already begun pre-transplant discussions with nephrology  -- access: left IJ tunneled catheter  (placed 12/04/2022) while new fistula matures  -- anemia: stable; ESA per nephrology  -- mineral / bone disorder: sevelamer 1600 mb tid cc  -- potassium balance: adequate without binders  -- blood pressure: see below  -- avoid nephrotoxic medications  -- dose medications for eGFR < 15    # Essential hypertension  BP remains above goal at most checks but has been as low as 110s/50s in last 24 hours so will not titrate up medications any further. May have some improvement once he gets to reasonable dry weight with HD  -- continue amlodipine, carvedilol    # Chronic diastolic heart failure  -- STOP spironolactone   -- diuretics unlikely to have meaningful benefit for maintaining euvolemia but may be desirable to maintain residual urine output  -- volume to be managed with dialysis    # Persistent atrial fibrillation  Rates adequately controlled on current regimen. CHADS2VASc >= 3 points (probably significant underestimate of his risk now that he is ESRD) so definitely warrants ongoing discussion of anticoagulation with PCP and cardiologist, has been hesitant in the past  -- continue carvedilol    # T2DM with hyperglycemia  Will need further insulin titration but will defer for today as hyperglycemia is exaggerated by dexamethasone given with anesthesia  -- HOLD home oral and non-formulary injectable medications  -- insulin glargine 18 units nightly + lispro 35 tid ac  -- sliding-scale insulin for goal glucose < 180  -- POC glucose achs     # s/p OLT  # Cholestatic liver injury, improving  Transaminases, alk phos, bilirubin above recent baseline early in admission likely due to congestive hepatopathy. MRCP would be helpful but he wants general anesthesia and risk and logistical challenges felt to offset diagnostic utility  Last FK level 5.2, needs ongoing dose titration due to recent dialysis start  -- tacrolimus 1 mg bid (goal trough 2-4), pharmacy to dose  -- MMF 250 mg bid    RESOLVED issues  RLE cellulitis, treated      Daily checklist:  VTE prophylaxis: HOLD for procedure  Diet: Nutrition Therapy Regular/House  Code  status: Full Code  HCDM:   HCDM (patient stated preference): [name] - [phone]    Disposition: Floor status. Anticipate discharge after next HD session on 12/05/2022. Contrary to some prior notes, would NOT expect that he needs to complete any set number of HD sessions via new catheter, since he has been able to tolerate multiple sessionsin a chair via temporary access already.    PT/OT recommendations: (not recorded) / (not recorded)  DME needs: (not recorded) / (not recorded)    I personally spent 50 minutes face-to-face and non-face-to-face in the care of this patient, which includes all pre, intra, and post visit time on the date of service.  All documented time was specific to the E/M visit and does not include any procedures that may have been performed.    Please page MDU intern with questions. This note reflects active problems and daily updates. Please refer to hospital course for additional details and resolved problems.    ___________________________________________________________________    Subjective:  Examined just after returning from VIR suite where left tunneled internal jugular dialysis catheter was successfully placed under general anesthesia. Discussed plan to remove temporary dialysis catheter in right internal jugular and then potentially discharge. Took several discussions with nurse, charge nurse, our team to negotiate pain medications and plan for temporary dialysis catheter removal      Labs/Studies:  Labs and Studies from the last 24hrs per EMR and Reviewed    Objective:  Temp:  [36.7 ??C (98.1 ??F)-37.5 ??C (99.5 ??F)] 36.7 ??C (98.1 ??F)  Heart Rate:  [56-70] 70  Resp:  [17-18] 17  BP: (119-159)/(66-90) 153/90  SpO2:  [90 %-98 %] 93 %    GEN: anxious appearing man lying in bed, moaning  CV: irreg irreg  PULM: unlabored in room air lying left side down  ABD: obese, soft  NEURO: alert but anxious, difficult to redirect away from pain concerns, moving all extremities    ------------  Dolores Patty, MD PhD  [facility]

## 2022-12-06 DIAGNOSIS — Z09 Encounter for follow-up examination after completed treatment for conditions other than malignant neoplasm: Principal | ICD-10-CM

## 2022-12-19 ENCOUNTER — Ambulatory Visit: Admit: 2022-12-19 | Discharge: 2022-12-20 | Payer: PRIVATE HEALTH INSURANCE

## 2022-12-19 NOTE — Unmapped (Signed)
Patient Name: Richard [name]  Medical Record Number: [id number]  Date of Service: 12/19/22    Referring Provider: Clarita Leber, MD.    Primary Provider: Mel Almond, MD.    Chief Complaint: This is a 57 y.o. male who is s/p creation of a right upper extremity arteriovenous fistula for angioaccess.     History of Present Illness:  Richard [name] is 57 y.o.  male. He has ESRD on hemodialysis. His current access is a tunneled right internal jugular catheter. The fistula was created 11/29/2022. He is here to discuss and schedule the planned second stage of the fistula creation, elevation. He has been feeling much better since starting dialysis. He has had quite a bit of excess fluid removed    The patient denies shortness of breath, chest pain with exertion and  can walk up 2 flights of stairs without being short of breath.    Past Medical History:  Past Medical History:   Diagnosis Date    COVID-19 07/18/2019    Family history of malignant neoplasm of prostate 06/23/2015    Hepatic cirrhosis (CMS-HCC) 06/23/2015    Overview:  Secondary to NASH; followed by Dr. Yevonne Pax, GI.  Last Assessment & Plan:  Relevant Hx: Course: Daily Update: Today's Plan:     Hypertension     Hypogonadism in male 02/12/2014    Liver cirrhosis secondary to NASH (nonalcoholic steatohepatitis) (CMS-HCC)     s/p liver transplant 2013       Past Surgical History:   Past Surgical History:   Procedure Laterality Date    liver transpant      LIVER TRANSPLANTATION  2013    PR COLONOSCOPY W/BIOPSY SINGLE/MULTIPLE N/A 08/13/2018    Procedure: COLONOSCOPY, FLEXIBLE, PROXIMAL TO SPLENIC FLEXURE; WITH BIOPSY, SINGLE OR MULTIPLE;  Surgeon: Neysa Hotter, MD;  [facility];  Service: Gastroenterology    PR COLSC FLX W/RMVL OF TUMOR POLYP LESION SNARE TQ N/A 08/13/2018    Procedure: COLONOSCOPY FLEX; W/REMOV TUMOR/LES BY SNARE;  Surgeon: Neysa Hotter, MD;  Location: [facility];  Service: Gastroenterology    PR CREAT AV FISTULA,NON-AUTOGENOUS GRAFT Right 11/29/2022    Procedure: CREATE AV FISTULA (SEPARATE PROC); NONAUTOGENOUS GRAFT (EG, BIOLOGICAL COLLAGEN, THERMOPLASTIC GRAFT), UPPER EXTREMITY;  Surgeon: Dyann Ruddle, MD;  Location: [facility];  Service: General Surgery    PR UPPER GI ENDOSCOPY,BIOPSY N/A 07/13/2015    Procedure: UGI ENDOSCOPY; WITH BIOPSY, SINGLE OR MULTIPLE;  Surgeon: Joaquin Music, MD;  Location: [facility];  Service: Gastroenterology       Medications:   Current Outpatient Medications on File Prior to Visit   Medication Sig Dispense Refill    amlodipine (NORVASC) 10 MG tablet Take 1 tablet (10 mg total) by mouth daily. 30 tablet 0    atorvastatin (LIPITOR) 40 MG tablet Take 1 tablet (40 mg total) by mouth nightly. Med to prevent heart attacks and help cholesterol 90 tablet 3    CHOLECALCIFEROL, VITAMIN D3, (VITAMIN D3 ORAL) Take 2,000 Units by mouth daily.      magnesium oxide (MAG-OX) 400 mg (241.3 mg elemental magnesium) tablet Take 1 tablet (400 mg total) by mouth daily.      multivitamin (TAB-A-VITE/THERAGRAN) per tablet Take 1 tablet by mouth daily.      mycophenolate (CELLCEPT) 250 mg capsule Take 1 capsule (250 mg total) by mouth two (2) times a day. 60 capsule 11    sevelamer (RENVELA) 800 mg tablet Take 2  tablets (1,600 mg total) by mouth Three (3) times a day with a meal. 180 tablet 0    sildenafil (VIAGRA) 50 MG tablet Take 1 tablet (50 mg total) by mouth daily as needed for erectile dysfunction.      tacrolimus (PROGRAF) 0.5 MG capsule Take 2 capsules (1 mg total) by mouth two (2) times a day. 360 capsule 3    blood-glucose meter,continuous (DEXCOM G6 RECEIVER) Misc 1 each by Miscellaneous route in the morning. Dispense 1 receiver annually.  Sent to [facility]. 1 each 0    blood-glucose sensor (DEXCOM G6 SENSOR) Devi Use as directed to monitor blood glucose 3 each 0    blood-glucose sensor (DEXCOM G6 SENSOR) Devi Use to check blood glucose and change every 10 days. 9 each 3    blood-glucose transmitter (DEXCOM G6 TRANSMITTER) Devi Use as directed to monitor blood glucose 1 each 3    blood-glucose transmitter (DEXCOM G6 TRANSMITTER) Devi 1 each by Miscellaneous route Every three (3) months. [facility]      carvedilol (COREG) 6.25 MG tablet Take 1 tablet (6.25 mg total) by mouth two (2) times a day. 180 tablet 3    [EXPIRED] cephalexin (KEFLEX) 500 MG capsule Take 1 capsule (500 mg total) by mouth four (4) times a day for 7 days. 28 capsule 0    [EXPIRED] HYDROmorphone (DILAUDID) 2 MG tablet Take 1 tablet (2 mg total) by mouth every eight (8) hours as needed for severe pain. 10 tablet 0    insulin aspart (NOVOLOG FLEXPEN) 100 unit/mL (3 mL) injection pen Inject 0.35 mL (35 Units total) under the skin Three (3) times a day before meals. 60 mL 0    insulin detemir U-100 (LEVEMIR) 100 unit/mL (3 mL) injection pen Inject 0.18 mL (18 Units total) under the skin nightly. 15 mL 0    pantoprazole (PROTONIX) 40 MG tablet Take 1 tablet (40 mg total) by mouth daily. (Patient not taking: Reported on 12/19/2022) 30 tablet 0    pen needle, diabetic 32 gauge x 5/32 (4 mm) Ndle Use with insulin up to 4 times a day as needed. 100 each 0    [EXPIRED] sulfamethoxazole-trimethoprim (BACTRIM DS) 800-160 mg per tablet Take 1 tablet (160 mg of trimethoprim total) by mouth two (2) times a day for 7 days. 14 tablet 0     No current facility-administered medications on file prior to visit.         Allergies:  No Known Allergies    Social History:  Social History     Socioeconomic History    Marital status: Married     Spouse name: None    Number of children: None    Years of education: None    Highest education level: None   Tobacco Use    Smoking status: Former     Passive exposure: Past    Smokeless tobacco: Never   Vaping Use    Vaping status: Never Used   Substance and Sexual Activity    Alcohol use: No    Drug use: No    Sexual activity: Yes     Partners: Female   Other Topics Concern    Do you use sunscreen? No    Tanning bed use? No    Are you easily burned? No    Excessive sun exposure? No    Blistering sunburns? No   Social History Narrative    From [place]    He was in the [employer].  Now lives in [place]    He works in Engineer, petroleum        Married    1 child (age 82.5)     Social Determinants of Health     Financial Resource Strain: Low Risk  (11/17/2022)    Overall Financial Resource Strain (CARDIA)     Difficulty of Paying Living Expenses: Not very hard   Food Insecurity: No Food Insecurity (11/17/2022)    Hunger Vital Sign     Worried About Running Out of Food in the Last Year: Never true     Ran Out of Food in the Last Year: Never true   Transportation Needs: No Transportation Needs (11/17/2022)    PRAPARE - Therapist, art (Medical): No     Lack of Transportation (Non-Medical): No   Physical Activity: Insufficiently Active (07/29/2020)    Received from [facility] visits prior to 08/26/2022.    Exercise Vital Sign     Days of Exercise per Week: 2 days     Minutes of Exercise per Session: 20 min   Stress: No Stress Concern Present (07/29/2020)    Received from [facility] visits prior to 08/26/2022.    [employer] of Occupational Health - Occupational Stress Questionnaire     Feeling of Stress : Not at all    Received from [employer], [facility]    Social Network       Family History:   Family History   Problem Relation Age of Onset    Diabetes Father     Liver disease Maternal Uncle     Diabetes Paternal Grandfather     Melanoma Neg Hx     Basal cell carcinoma Neg Hx     Squamous cell carcinoma Neg Hx     Kidney disease Neg Hx        Review of systems:     A 10-system review of systems was completed with the exception of those positives mentioned in history of present illness and past medical history is negative.      Physical examination:     BP 140/63  - Pulse 69  - Temp 35.9 ??C (96.6 ??F)  - Resp 20 - Ht 177.8 cm (5' 10)  - SpO2 98%  - BMI 43.91 kg/m??     CONSTITUTIONAL: This is a well-appearing male in no acute distress, whose appearance is consistent with stated age.   EYES: Anicteric. Extraocular movements are grossly intact.   EAR, NOSE, MOUTH AND THROAT: Mucous membranes moist.  Trachea midline.   HEART/CARDIOVASCULAR: Normal sinus rhythm.   CHEST/PULMONARY: Clear to auscultation. Normal work of breathing on room air.   ABDOMEN/GASTROINTESTINAL: Soft, nontender and nondistended without palpable organomegaly or mass.   SKIN: Warm and well perfused without cyanosis or edema.   NEUROLOGIC: Alert and oriented x3. Sensory and motor grossly intact.  LYMPHATICS: No palpable lymphadenopathy.    Extremities: Palpable thrill over the right upper arm cephalic vein. The incision is healing well. There is no edema. No distal ischemia.     Assessment:   This is a 57 y.o. patient with ESRD on hemodialysis. He has had a fistula creation as the first of a 2 stage procedure.     Plan:    I recommended scheduling the second stage elevation in 4-6 weeks. The risks of bleeding, infection, edema, and loss of the access were discussed.     With all of the  patient's questions answered, and understanding the risks, the patient wished to proceed.     The procedure will be scheduled at the earliest mutually agreeable and available date.    Dyann Ruddle, MD  12/19/2022  11:19 AM

## 2022-12-19 NOTE — Unmapped (Signed)
Weight 271.0 lbs.  Hs lost weight with diet and walking.  Palpable thrill on right a/c, post fistula creation.  No pain.

## 2022-12-21 DIAGNOSIS — N19 Unspecified kidney failure: Principal | ICD-10-CM

## 2022-12-21 LAB — CREATININE
CREATININE: 7.95 mg/dL — ABNORMAL HIGH
EGFR CKD-EPI (2021) MALE: 7 mL/min/{1.73_m2} — ABNORMAL LOW (ref >=60)

## 2022-12-21 LAB — BUN: BLOOD UREA NITROGEN: 26 mg/dL — ABNORMAL HIGH (ref 9–23)

## 2022-12-22 ENCOUNTER — Ambulatory Visit
Admit: 2022-12-22 | Discharge: 2022-12-23 | Payer: PRIVATE HEALTH INSURANCE | Attending: Nephrology | Primary: Nephrology

## 2022-12-22 ENCOUNTER — Telehealth
Admit: 2022-12-22 | Discharge: 2022-12-23 | Payer: PRIVATE HEALTH INSURANCE | Attending: Registered" | Primary: Registered"

## 2022-12-22 LAB — BETA 2 MICROGLOBULIN, SERUM: BETA 2 MICROGLOBULIN: 31.64 ug/mL — ABNORMAL HIGH (ref 0.87–2.34)

## 2022-12-22 NOTE — Unmapped (Signed)
[facility]patient Nutrition Services   Pre-Op Bariatric Medical Nutrition Therapy Consultation       Visit Type: Return Bariatric Nutrition Assessment    Referral Reason: obesity      Richard [name] is a 57 y.o. male seen for medical nutrition therapy for weight loss surgery. His medication list, labs, and typical intake were reviewed.     His interim medical history is significant for Hypertension, Heart Failure, OSA, Diabetes (Type 2), ESRD, GERD, and Obesity.    Anthropometrics   Height: 177.8 cm (5' 10)   Weight: (!) 122.9 kg (271 lb)  Body mass index is 38.88 kg/m??.    Wt Readings from Last 12 Encounters:   12/22/22 (!) 122.9 kg (271 lb)   12/04/22 (!) 138.8 kg (306 lb)   11/17/22 (!) 135.4 kg (298 lb 8.1 oz)   11/13/22 (!) 131.5 kg (290 lb)   11/09/22 (!) 131.5 kg (290 lb)   11/09/22 (!) 131.8 kg (290 lb 8 oz)   09/26/22 (!) 127 kg (280 lb)   09/26/22 (!) 127.1 kg (280 lb 1.6 oz)   08/04/22 (!) 131.5 kg (289 lb 12.8 oz)   08/01/22 (!) 132.5 kg (292 lb)   07/26/22 (!) 127 kg (280 lb)   06/30/22 (!) 132.2 kg (291 lb 6.4 oz)        Ideal Body Weight: 75.36 kg  Weight in (lb) to have BMI = 25: 173.9  Goal weight (per patient): 200#   Weight Change: -8.6kg since last RD visit    Nutrition Risk Screening:   Food Insecurity: No Food Insecurity (11/17/2022)    Hunger Vital Sign     Worried About Running Out of Food in the Last Year: Never true     Ran Out of Food in the Last Year: Never true       Nutrition Focused Physical Exam:  Nutrition Evaluation  Overall Impressions: Unable to perform Nutrition-Focused Physical Exam at this time due to (comment) (virtual visit) (12/22/22 0919)  Nutrition Designation: Obese class II  (BMI 35.00 - 39.99 kg/m2) (12/22/22 0919)    Malnutrition Screening:   Unable to complete Malnutrition Assessment at this time due to (comment) (needs NFPE - intentional weight loss/likely not meeting estimated needs) (12/22/22 0920)    Biochemical Data, Medical Tests and Procedures:  No recent pertinent labs or other nutritionally relevant data available for review.    Lab Results   Component Value Date    HGB 8.9 (L) 12/04/2022    HCT 26.8 (L) 12/04/2022    MCV 85.8 12/04/2022    IRON 95 11/09/2022    TIBC 267.1 11/09/2022    FERRITIN 169.8 11/09/2022    VITAMINB12 721 11/09/2022    FOLATE 20.8 11/09/2022    VITDTOTAL 21.2 11/09/2022     Lab Results   Component Value Date    ALKPHOS 211 (H) 12/04/2022    ALT 29 12/04/2022    AST 101 (H) 12/04/2022    BUN 26 (H) 12/21/2022    CREATININE 7.95 (H) 12/21/2022    GFRNONAA 27 (L) 07/15/2021    GFRAA >=60 03/27/2017     Lab Results   Component Value Date    CHOL 211 (H) 04/24/2022    HDL 33 (L) 04/24/2022    LDL 104 (H) 04/24/2022    NONHDL 178 (H) 04/24/2022    TRIG 369 (H) 04/24/2022    A1C 8.3 (H) 08/04/2022    TSH 2.539 11/09/2022       New  developments since last nutrition appointment:  Patient Updates: Patient states he is on a strict diet. Not eating breakfast bc he is fasting.     Patient has been working on the following Bariatric Dietary Behaviors:  Not drinking with meals and waiting for 30 minutes after - accomplished  Focusing on protein rich foods at meals/snacks - accomplished  Mindful eating - accomplished  Eliminating carbonated/sugar sweetened beverages - accomplished  Choosing a protein shake and a bariatric vitamins - not accomplished   Protein shake: not tried                             Bariatric Multivitamin: not yet selected  7.   Maintaining a regular exercise regimen - not accomplished   Type: a little bit of walking - I've just had all this stuff done to me.   Duration:    Frequency:    Physical Activity:  Physical activity level is light with some exercise.     Medications and Vitamin/Mineral Supplementation:   All nutritionally pertinent medications reviewed on 12/22/2022.   Nutritionally pertinent medications include: insulin, Mag-Ox, pantoprazole, sevelamer    He is taking nutrition supplements. Multivitamin, Vitamin D3    Current Outpatient Medications   Medication Sig Dispense Refill    amlodipine (NORVASC) 10 MG tablet Take 1 tablet (10 mg total) by mouth daily. 30 tablet 0    atorvastatin (LIPITOR) 40 MG tablet Take 1 tablet (40 mg total) by mouth nightly. Med to prevent heart attacks and help cholesterol 90 tablet 3    blood-glucose meter,continuous (DEXCOM G6 RECEIVER) Misc 1 each by Miscellaneous route in the morning. Dispense 1 receiver annually.  Sent to [facility]. 1 each 0    blood-glucose sensor (DEXCOM G6 SENSOR) Devi Use as directed to monitor blood glucose 3 each 0    blood-glucose sensor (DEXCOM G6 SENSOR) Devi Use to check blood glucose and change every 10 days. 9 each 3    blood-glucose transmitter (DEXCOM G6 TRANSMITTER) Devi Use as directed to monitor blood glucose 1 each 3    blood-glucose transmitter (DEXCOM G6 TRANSMITTER) Devi 1 each by Miscellaneous route Every three (3) months. [facility]      carvedilol (COREG) 6.25 MG tablet Take 1 tablet (6.25 mg total) by mouth two (2) times a day. 180 tablet 3    CHOLECALCIFEROL, VITAMIN D3, (VITAMIN D3 ORAL) Take 2,000 Units by mouth daily.      insulin aspart (NOVOLOG FLEXPEN) 100 unit/mL (3 mL) injection pen Inject 0.35 mL (35 Units total) under the skin Three (3) times a day before meals. 60 mL 0    insulin detemir U-100 (LEVEMIR) 100 unit/mL (3 mL) injection pen Inject 0.18 mL (18 Units total) under the skin nightly. 15 mL 0    magnesium oxide (MAG-OX) 400 mg (241.3 mg elemental magnesium) tablet Take 1 tablet (400 mg total) by mouth daily.      multivitamin (TAB-A-VITE/THERAGRAN) per tablet Take 1 tablet by mouth daily.      mycophenolate (CELLCEPT) 250 mg capsule Take 1 capsule (250 mg total) by mouth two (2) times a day. 60 capsule 11    pantoprazole (PROTONIX) 40 MG tablet Take 1 tablet (40 mg total) by mouth daily. (Patient not taking: Reported on 12/19/2022) 30 tablet 0    pen needle, diabetic 32 gauge x 5/32 (4 mm) Ndle Use with insulin up to 4 times a day as needed. 100 each 0  sevelamer (RENVELA) 800 mg tablet Take 2 tablets (1,600 mg total) by mouth Three (3) times a day with a meal. 180 tablet 0    sildenafil (VIAGRA) 50 MG tablet Take 1 tablet (50 mg total) by mouth daily as needed for erectile dysfunction.      tacrolimus (PROGRAF) 0.5 MG capsule Take 2 capsules (1 mg total) by mouth two (2) times a day. 360 capsule 3     No current facility-administered medications for this visit.       Nutrition History:     Dietary Restrictions: Patient may have a sensitivity to some bread products.     Gastrointestinal Issues: Reflux    Hunger and Satiety: Denied issues.     24-Hour Recall/Usual Intake:  Up: 7-8:00am - patient states he is fasting  B   S   L 12:00pm: a lot of protein and raw vegetables - chicken + mixed greens/carrots/avocado  S   D 6:00pm: protein (chicken/turkey) + 1/2 sweet potato + greens  HS     Food-Related History:  Beverages: 32oz water  Dining Out: denies  Tracking protein? Just trying to get it in at meals.  Meal Schedule: meals: 2, snacks: 0     Eating Behaviors:  Excessive Hunger: Denies  Emotional Eating: Denied issues.   Grazing: Denied issues.   Cravings: Denies   Nighttime Eating: None.  Fast Eating: Denied issues.   Overeating: Denied issues with overeating.     Nutritional Needs:  Energy: 2075 kcals [Per [place] [employer]-Jeor Equation (AF: 1.0 for weight loss) using last recorded weight (from [facility] 06/25), 122.9 kg (12/22/22 0921)]  Protein: 79-95 gm [1.0-1.2 gm/kg using adjusted body weight (for a BMI: 25), 79 kg (12/22/22 0921)]  Carbohydrate:   [< / equal to 45% of kcal]  Fluid:   [per MD team]      Nutrition Goals & Evaluation      Demonstrate understanding of bariatric nutrition  (Ongoing and Progressing)  Demonstrate behavioral change  (Ongoing and Progressing)  Meet nutritional needs (Not Met and Ongoing)  Compliance with nutrition goals (Ongoing and Progressing)  (0.5-1.0)#/wk weight loss since last RD visit (Met and Ongoing)    Nutrition goals reviewed, and relevant barriers identified and addressed: none evident. He is evaluated to have good willingness and ability to achieve nutrition goals.     Nutrition Assessment       The patient is currently likely not meeting estimated nutrition needs related to meal skipping with fasting. Discussed with patient that fasting post op is going to make reaching daily protein goal either very difficult or impossible. Patient expressed an understanding of the needs for a consistent meal/snack schedule post op. Per anthropometric data, BMI: 38.88 classifies patient as obese (class 2). EMR indicates a 8.6kg weight loss since last RD visit. There are no new labs to assess. He has been successful with previous nutrition recommendations. He is eating 2 meals and no snacks per day. Patient is focused on protein at meals with carbohydrates coming mostly from non-starchy vegetable. Beverages are sugar free and non-carbonated. He is practicing not drinking with meals, however, this remains a challenge. He is getting back into a regular exercise regimen since being in the hospital. He has not selected an appropriate bariatric multivitamin or tried any protein supplements - education and suggestions provided.         Bariatric Surgery Assessment: RD is withholding assessment for insurance approval for weight loss surgery pending patient follow-up where success with plan  goals and food record/usual intake/24-hour recall will be assessed.           Nutrition Intervention      - Nutrition Education: bariatric surgery diet/eating style. Education resources provided include: after visit summary with patient instructions     Nutrition Plan:   Please see patient instructions.    Follow up will occur in 2 months per patient request.       Food/Nutrition-related history, Anthropometric measurements, Biochemical data, medical tests, procedures, Patient understanding or compliance with intervention and recommendations , and Effectiveness of nutrition interventions will be assessed at time of follow-up.       Recommendations for Clinical Team:  Please encourage a consistent meal/snack schedule with a focus on protein. Exercise outside of daily routine should also be encouraged for 150-300 minutes per week as patient is able.        Time spent 15 minutes

## 2022-12-23 NOTE — Unmapped (Signed)
GOALS:    Meal schedule: remember to eat protein every 3-4 hours                          - include protein at every meal and every snack              8:00am: breakfast - include protein               12:00pm: lunch - protein + non-starchy vegetables              4:00pm: protein based snack              6:00pm: dinner - protein + non-starchy vegetables              9:00pm: protein based snack     PROTEIN OPTIONS: chicken/beef/pork, fish/tuna, eggs, cheese, cottage cheese, [place] yogurt, deli meat, protein shakes, beans, nuts/seeds, peanut butter              * Nuts and peanut butter are restricted to one serving once per day.                          1/4 cup nuts OR 2 Tbsp peanut butter     2.  Eat protein foods first. Vegetables second. Carbohydrates/fruits last.      3.  Practice not drinking at meals and waiting for 30 minutes after meals to start drinking again     4.  Practice mindful eating. Eat until you are satisfied. Do not go to the point of being full.     5.  Eliminate carbonated/sugar sweetened beverages!              - NO juice, soda, diet soda, sweet tea, lemonade, Kool-Aid     6.  Research vitamins and try protein shakes.              - Protein shakes can be made at home using a whey protein powder + water or low fat milk or an unsweetened milk alternative                          - do not add frozen fruit, bananas or peanut butter to homemade protein shakes              - Premade protein shakes should have at least 20g of protein and carbs/sugars should be in the single digits                          - avoid products labeled keto                          - options include: Premier Protein, Ensure/Boost Max Protein                          - be sure to check the nutrition label on any product you choose     7.  EXERCISE: Continue with current exercise   - increase duration/frequency/intensity as tolerated   - GOAL: 150-300 minutes per week

## 2022-12-26 DIAGNOSIS — R071 Chest pain on breathing: Principal | ICD-10-CM

## 2022-12-28 MED ORDER — OZEMPIC 2 MG/DOSE (8 MG/3 ML) SUBCUTANEOUS PEN INJECTOR
SUBCUTANEOUS | 3 refills | 84 days
Start: 2022-12-28 — End: ?

## 2023-01-10 NOTE — Unmapped (Signed)
Pt lvm that he needs to reschedule 7/22 annual.    Called pt who said he forgot about that appt and made another appt for surgery that day.  Pt rescheduled to 8/9. Told him this tpa will ask primary coord if pt needs another set of labs before then and let him know. He denied need for appt letter and verbalized understanding of all discussed.    Sent message to primary coord about labs.

## 2023-01-10 NOTE — Unmapped (Signed)
Contacted patient and requested he repeat labs again, since AST was elevated during his hospitalization. Contacted his dialysis ctr, [facility] in [place], but they are unable to complete labs for any other agency. Pt.agreed to repeat labs next week in [facility] before his procedure. Reminded him to hold his Tac until after labs are drawn. He verbalized understanding.

## 2023-01-15 ENCOUNTER — Encounter: Admit: 2023-01-15 | Discharge: 2023-01-15 | Payer: PRIVATE HEALTH INSURANCE

## 2023-01-15 ENCOUNTER — Ambulatory Visit: Admit: 2023-01-15 | Discharge: 2023-01-15 | Payer: PRIVATE HEALTH INSURANCE

## 2023-01-15 LAB — BASIC METABOLIC PANEL
ANION GAP: 12 mmol/L (ref 5–14)
BLOOD UREA NITROGEN: 25 mg/dL — ABNORMAL HIGH (ref 9–23)
BUN / CREAT RATIO: 3
CALCIUM: 9.4 mg/dL (ref 8.7–10.4)
CHLORIDE: 98 mmol/L (ref 98–107)
CO2: 24.4 mmol/L (ref 20.0–31.0)
CREATININE: 8.37 mg/dL — ABNORMAL HIGH
EGFR CKD-EPI (2021) MALE: 7 mL/min/{1.73_m2} — ABNORMAL LOW (ref >=60)
GLUCOSE RANDOM: 288 mg/dL — ABNORMAL HIGH (ref 70–99)
POTASSIUM: 4.2 mmol/L (ref 3.4–4.8)
SODIUM: 134 mmol/L — ABNORMAL LOW (ref 135–145)

## 2023-01-15 MED ORDER — OXYCODONE 5 MG TABLET
ORAL_TABLET | ORAL | 0 refills | 2 days | Status: CP | PRN
Start: 2023-01-15 — End: 2023-01-20
  Filled 2023-01-15: qty 10, 2d supply, fill #0

## 2023-01-15 MED ADMIN — lidocaine (PF) (XYLOCAINE-MPF) 20 mg/mL (2 %) injection: INTRAVENOUS | @ 13:00:00 | Stop: 2023-01-15 | NDC 70756064887

## 2023-01-15 MED ADMIN — mepivacaine (PF) (CARBOCAINE) 15 mg/mL (1.5 %) injection: PERINEURAL | @ 12:00:00 | Stop: 2023-01-15 | NDC 00057023401

## 2023-01-15 MED ADMIN — fentaNYL (PF) (SUBLIMAZE) injection 25 mcg: 25 ug | INTRAVENOUS | @ 15:00:00 | Stop: 2023-01-15 | NDC 68258302301

## 2023-01-15 MED ADMIN — fentaNYL (PF) (SUBLIMAZE) injection: INTRAVENOUS | @ 13:00:00 | Stop: 2023-01-15 | NDC 50458002005

## 2023-01-15 MED ADMIN — ropivacaine (NAROPIN) 5 mg/mL (0.5 %) injection: PERINEURAL | @ 12:00:00 | Stop: 2023-01-15 | NDC 72572070710

## 2023-01-15 MED ADMIN — insulin regular (HumuLIN,NovoLIN) injection 5 Units: 5 [IU] | SUBCUTANEOUS | @ 14:00:00 | Stop: 2023-01-15 | NDC 68115072810

## 2023-01-15 MED ADMIN — Propofol (DIPRIVAN) injection: INTRAVENOUS | @ 14:00:00 | Stop: 2023-01-15 | NDC 72572061210

## 2023-01-15 MED ADMIN — cefepime (MAXIPIME) injection: INTRAVENOUS | @ 13:00:00 | Stop: 2023-01-15 | NDC 51479005310

## 2023-01-15 MED ADMIN — phenylephrine 1 mg/10 mL (100 mcg/mL) injection Syrg: INTRAVENOUS | @ 13:00:00 | Stop: 2023-01-15 | NDC 55081114500

## 2023-01-15 MED ADMIN — insulin regular (HumuLIN,NovoLIN) injection: SUBCUTANEOUS | @ 13:00:00 | Stop: 2023-01-15 | NDC 68115072810

## 2023-01-15 MED ADMIN — oxyCODONE (ROXICODONE) immediate release tablet 5 mg: 5 mg | ORAL | @ 15:00:00 | Stop: 2023-01-15 | NDC 72162174902

## 2023-01-15 MED ADMIN — fentaNYL (PF) (SUBLIMAZE) injection: INTRAVENOUS | @ 12:00:00 | Stop: 2023-01-15 | NDC 50458002005

## 2023-01-15 MED ADMIN — insulin regular (HumuLIN,NovoLIN) injection 4 Units: 4 [IU] | SUBCUTANEOUS | @ 15:00:00 | Stop: 2023-01-15 | NDC 68115072810

## 2023-01-15 MED ADMIN — ondansetron (ZOFRAN) injection: INTRAVENOUS | @ 13:00:00 | Stop: 2023-01-15 | NDC 62756018101

## 2023-01-15 MED ADMIN — Propofol (DIPRIVAN) injection: INTRAVENOUS | @ 13:00:00 | Stop: 2023-01-15 | NDC 72572061210

## 2023-01-15 MED ADMIN — sodium chloride (NS) 0.9 % infusion: 10 mL/h | INTRAVENOUS | @ 12:00:00 | Stop: 2023-01-15 | NDC 05928011001

## 2023-01-15 MED ADMIN — midazolam (VERSED) injection: INTRAVENOUS | @ 12:00:00 | Stop: 2023-01-15 | NDC 61703032173

## 2023-01-15 NOTE — Unmapped (Signed)
Arteriovenous Fistula Elevation Procedure Note     Indications: ERSRD on hemodialysis. Catheter dependent. Right brachiocephalic AVF is patent but deep and requires elevation to facilitate cannulation    Pre-operative Diagnosis:  Chronic renal insufficiency   ESRD     Post-operative Diagnosis: same.     Operation: Creation of upper extremity autogenous AV fistula, brachio cephalic       Surgeon: Dyann Ruddle, MD      Assistants: Montez Hageman, MD, R2     Anesthesia: General, LMA with regional block     ASA Class: 4     Procedure Details   The patient was seen in the Holding Room. The risks, benefits, complications, treatment options, and expected outcomes were discussed with the patient. The patient concurred with the proposed plan, giving informed consent.  The site of surgery properly noted/marked.  A Time Out was held and the above information confirmed.     The patient was brought to the Operating Room. The left arm prepped and draped in a sterile fashion.  An initial skin incision was made vertically from the antecubital fossa to just below the shoulder along the course of the cephalic vein. The thrill could be palpated just above the antecubital fossa. The fistula then became very deep. It was unroofed for the length of the skin incision. It was notably dilated from 6-8 mm throughout its length. The vein was then mobilized by dividing tributaries between silk ties on the vein side and clips on the subcutaneous side. The thrill persisted, although there was some spasm noted. Hemostasis was maintained with cautery. A subdermal pocket was then created along the lateral skin flap. The subcutaneous tissue was then closed in a such a manner as to keep the mobilized vein tucked into the pocket. This created plenty of cannulation zone and the outflow remained patent. The skin was closed with running intra-cuticular 3-0 Monocryl. Steristrips dressings were applied.      Findings:   Thrill prtesent at the end of the procedure     Estimate Blood Loss:  Minimal, 5cc           Drains: none           Total IV Fluids:           Specimens: None           Implants: none           Complications:  None; patient tolerated the procedure well.           Disposition: PACU - hemodynamically stable.           Condition: stable     Attending Attestation: I was present and scrubbed for the entire procedure, except skin closure

## 2023-01-16 DIAGNOSIS — Z794 Long term (current) use of insulin: Principal | ICD-10-CM

## 2023-01-16 DIAGNOSIS — E1165 Type 2 diabetes mellitus with hyperglycemia: Principal | ICD-10-CM

## 2023-01-16 MED ORDER — INSULIN ASPART (U-100) 100 UNIT/ML (3 ML) SUBCUTANEOUS PEN
Freq: Three times a day (TID) | SUBCUTANEOUS | 0 refills | 57 days | Status: CN
Start: 2023-01-16 — End: ?

## 2023-01-16 MED ORDER — PEN NEEDLE, DIABETIC 32 GAUGE X 5/32" (4 MM)
0 refills | 0 days
Start: 2023-01-16 — End: ?

## 2023-01-16 MED FILL — DEXCOM G6 TRANSMITTER DEVICE: 90 days supply | Qty: 1 | Fill #1

## 2023-01-16 MED FILL — NOVOLOG FLEXPEN U-100 INSULIN ASPART 100 UNIT/ML (3 ML) SUBCUTANEOUS: SUBCUTANEOUS | 57 days supply | Qty: 60 | Fill #0

## 2023-01-16 MED FILL — CARVEDILOL 6.25 MG TABLET: ORAL | 90 days supply | Qty: 180 | Fill #1

## 2023-01-16 MED FILL — DEXCOM G6 SENSOR DEVICE: 90 days supply | Qty: 9 | Fill #1

## 2023-01-18 ENCOUNTER — Ambulatory Visit: Admit: 2023-01-18 | Discharge: 2023-01-19 | Payer: PRIVATE HEALTH INSURANCE

## 2023-01-18 ENCOUNTER — Ambulatory Visit
Admit: 2023-01-18 | Discharge: 2023-01-19 | Disposition: A | Discharge status: Home / Self Care | Payer: PRIVATE HEALTH INSURANCE | Admitting: Student in an Organized Health Care Education/Training Program

## 2023-01-18 DIAGNOSIS — K219 Gastro-esophageal reflux disease without esophagitis: Principal | ICD-10-CM

## 2023-01-18 LAB — COMPREHENSIVE METABOLIC PANEL
ALBUMIN: 3.1 g/dL — ABNORMAL LOW (ref 3.4–5.0)
ALKALINE PHOSPHATASE: 354 U/L — ABNORMAL HIGH (ref 46–116)
ALT (SGPT): 16 U/L (ref 10–49)
ANION GAP: 7 mmol/L (ref 5–14)
AST (SGOT): 78 U/L — ABNORMAL HIGH (ref <=34)
BILIRUBIN TOTAL: 0.7 mg/dL (ref 0.3–1.2)
BLOOD UREA NITROGEN: 19 mg/dL (ref 9–23)
BUN / CREAT RATIO: 3
CALCIUM: 8.9 mg/dL (ref 8.7–10.4)
CHLORIDE: 97 mmol/L — ABNORMAL LOW (ref 98–107)
CO2: 27.3 mmol/L (ref 20.0–31.0)
CREATININE: 6.55 mg/dL — ABNORMAL HIGH
EGFR CKD-EPI (2021) MALE: 9 mL/min/{1.73_m2} — ABNORMAL LOW (ref >=60)
GLUCOSE RANDOM: 425 mg/dL — ABNORMAL HIGH (ref 70–179)
POTASSIUM: 4.5 mmol/L (ref 3.4–4.8)
PROTEIN TOTAL: 7.3 g/dL (ref 5.7–8.2)
SODIUM: 131 mmol/L — ABNORMAL LOW (ref 135–145)

## 2023-01-18 LAB — CBC W/ AUTO DIFF
BASOPHILS ABSOLUTE COUNT: 0 10*9/L (ref 0.0–0.1)
BASOPHILS RELATIVE PERCENT: 0.7 %
EOSINOPHILS ABSOLUTE COUNT: 0.2 10*9/L (ref 0.0–0.5)
EOSINOPHILS RELATIVE PERCENT: 3.9 %
HEMATOCRIT: 35.2 % — ABNORMAL LOW (ref 39.0–48.0)
HEMOGLOBIN: 11.6 g/dL — ABNORMAL LOW (ref 12.9–16.5)
LYMPHOCYTES ABSOLUTE COUNT: 1 10*9/L — ABNORMAL LOW (ref 1.1–3.6)
LYMPHOCYTES RELATIVE PERCENT: 19.2 %
MEAN CORPUSCULAR HEMOGLOBIN CONC: 32.9 g/dL (ref 32.0–36.0)
MEAN CORPUSCULAR HEMOGLOBIN: 28.5 pg (ref 25.9–32.4)
MEAN CORPUSCULAR VOLUME: 86.7 fL (ref 77.6–95.7)
MEAN PLATELET VOLUME: 8.2 fL (ref 6.8–10.7)
MONOCYTES ABSOLUTE COUNT: 0.6 10*9/L (ref 0.3–0.8)
MONOCYTES RELATIVE PERCENT: 11.2 %
NEUTROPHILS ABSOLUTE COUNT: 3.4 10*9/L (ref 1.8–7.8)
NEUTROPHILS RELATIVE PERCENT: 65 %
NUCLEATED RED BLOOD CELLS: 0 /100{WBCs} (ref <=4)
PLATELET COUNT: 206 10*9/L (ref 150–450)
RED BLOOD CELL COUNT: 4.06 10*12/L — ABNORMAL LOW (ref 4.26–5.60)
RED CELL DISTRIBUTION WIDTH: 14.7 % (ref 12.2–15.2)
WBC ADJUSTED: 5.2 10*9/L (ref 3.6–11.2)

## 2023-01-18 LAB — LACTATE SEPSIS, VENOUS: LACTATE BLOOD VENOUS: 2.4 mmol/L (ref 0.5–1.8)

## 2023-01-18 MED ORDER — PANTOPRAZOLE 40 MG TABLET,DELAYED RELEASE
ORAL_TABLET | Freq: Every day | ORAL | 6 refills | 30 days | Status: CP
Start: 2023-01-18 — End: ?

## 2023-01-18 MED ORDER — PEN NEEDLE, DIABETIC 32 GAUGE X 5/32" (4 MM)
0 refills | 0 days | Status: CP
Start: 2023-01-18 — End: ?

## 2023-01-18 NOTE — Unmapped (Signed)
Pt advised to call surgeon right now

## 2023-01-18 NOTE — Unmapped (Signed)
Patient called and says his fisutla is warm to the touch, no drainage but extremely painful.  No fever or chills.  I have instructed him to please come to the [facility] ER.

## 2023-01-19 LAB — LACTATE, VENOUS, WHOLE BLOOD: LACTATE BLOOD VENOUS: 2.2 mmol/L — ABNORMAL HIGH (ref 0.5–1.8)

## 2023-01-19 MED ORDER — PANTOPRAZOLE 40 MG TABLET,DELAYED RELEASE
ORAL_TABLET | Freq: Every day | ORAL | 0 refills | 30 days | Status: CP
Start: 2023-01-19 — End: 2023-02-18
  Filled 2023-01-19: qty 30, 30d supply, fill #0

## 2023-01-19 MED ORDER — OXYCODONE 10 MG TABLET
ORAL_TABLET | ORAL | 0 refills | 3 days | Status: CP | PRN
Start: 2023-01-19 — End: 2023-01-19
  Filled 2023-01-19: qty 15, 3d supply, fill #0

## 2023-01-19 MED ORDER — ALUMINUM-MAG HYDROXIDE-SIMETHICONE 400 MG-400 MG-40 MG/5 ML ORAL SUSP
ORAL | 0 refills | 3 days | Status: CP | PRN
Start: 2023-01-19 — End: 2023-02-18

## 2023-01-19 MED ORDER — OXYCODONE 5 MG TABLET
ORAL_TABLET | ORAL | 0 refills | 1 days | Status: CP | PRN
Start: 2023-01-19 — End: 2023-01-19

## 2023-01-19 MED ADMIN — carvedilol (COREG) tablet 6.25 mg: 6.25 mg | ORAL | @ 13:00:00 | Stop: 2023-01-19 | NDC 82009012805

## 2023-01-19 MED ADMIN — hydrOXYzine (ATARAX) tablet 25 mg: 25 mg | ORAL | @ 07:00:00 | Stop: 2023-01-19 | NDC 00641637515

## 2023-01-19 MED ADMIN — vancomycin (VANCOCIN) 2000 mg in sodium chloride (NS) 0.9% 500 mL IVPB: 2000 mg | INTRAVENOUS | @ 03:00:00 | NDC 72611076110

## 2023-01-19 MED ADMIN — amlodipine (NORVASC) tablet 10 mg: 10 mg | ORAL | @ 13:00:00 | Stop: 2023-01-19 | NDC 82009002810

## 2023-01-19 MED ADMIN — oxyCODONE (ROXICODONE) immediate release tablet 5 mg: 5 mg | ORAL | @ 01:00:00 | Stop: 2023-01-18 | NDC 72162174902

## 2023-01-19 MED ADMIN — pantoprazole (Protonix) EC tablet 40 mg: 40 mg | ORAL | @ 19:00:00 | Stop: 2023-01-19 | NDC 82009001190

## 2023-01-19 MED ADMIN — insulin lispro (HumaLOG) injection 0-20 Units: 0-20 [IU] | SUBCUTANEOUS | @ 16:00:00 | Stop: 2023-01-19 | NDC 68115074610

## 2023-01-19 MED ADMIN — insulin lispro (HumaLOG) injection 40 Units: 40 [IU] | SUBCUTANEOUS | @ 16:00:00 | Stop: 2023-01-19 | NDC 68115074610

## 2023-01-19 MED ADMIN — morphine 4 mg/mL injection 4 mg: 4 mg | INTRAVENOUS | @ 03:00:00 | Stop: 2023-01-18 | NDC 76045000511

## 2023-01-19 MED ADMIN — tacrolimus (PROGRAF) capsule 1 mg: 1 mg | ORAL | @ 13:00:00 | Stop: 2023-01-19 | NDC 82804003230

## 2023-01-19 MED ADMIN — calcium carbonate (TUMS) chewable tablet 400 mg elem calcium: 400 mg | ORAL | @ 16:00:00 | Stop: 2023-01-19

## 2023-01-19 MED ADMIN — HYDROmorphone (PF) (DILAUDID) injection 0.5 mg: .5 mg | INTRAVENOUS | @ 10:00:00 | Stop: 2023-01-19 | NDC 00409426411

## 2023-01-19 MED ADMIN — HYDROmorphone (PF) (DILAUDID) injection 0.5 mg: .5 mg | INTRAVENOUS | @ 05:00:00 | Stop: 2023-01-19 | NDC 00409426411

## 2023-01-19 MED ADMIN — sevelamer (RENVELA) tablet 1,600 mg: 1600 mg | ORAL | @ 16:00:00 | Stop: 2023-01-19 | NDC 76282040727

## 2023-01-19 MED ADMIN — apixaban (ELIQUIS) tablet 5 mg: 5 mg | ORAL | @ 21:00:00 | Stop: 2023-01-19 | NDC 71610066280

## 2023-01-19 MED ADMIN — iohexol (OMNIPAQUE) 350 mg iodine/mL solution 100 mL: 100 mL | INTRAVENOUS | @ 03:00:00 | Stop: 2023-01-18 | NDC 00407141498

## 2023-01-19 MED ADMIN — insulin lispro (HumaLOG) injection 0-20 Units: 0-20 [IU] | SUBCUTANEOUS | @ 11:00:00 | Stop: 2023-01-19 | NDC 68115074610

## 2023-01-19 MED ADMIN — morphine 4 mg/mL injection 4 mg: 4 mg | INTRAVENOUS | @ 19:00:00 | Stop: 2023-01-19 | NDC 76045000511

## 2023-01-19 MED ADMIN — morphine 4 mg/mL injection 4 mg: 4 mg | INTRAVENOUS | @ 14:00:00 | Stop: 2023-01-19 | NDC 76045000511

## 2023-01-19 MED ADMIN — diphenhydrAMINE (BENADRYL) injection 25 mg: 25 mg | INTRAVENOUS | @ 06:00:00 | Stop: 2023-01-19 | NDC 97807007030

## 2023-01-19 MED ADMIN — cefepime (MAXIPIME) 2 g in sodium chloride 0.9 % (NS) 100 mL IVPB-MBP: 2 g | INTRAVENOUS | @ 02:00:00 | Stop: 2023-01-18 | NDC 71288000921

## 2023-01-19 NOTE — Unmapped (Signed)
New Surgery Consult Note      Requesting Attending Physician:  Dwana Curd, MD  Service Requesting Consult:  General Medicine (MED)  Service Providing Consult: General Surgery  Consulting Attending: Dyann Ruddle    Assessment:  Richard [name] is a 57 y.o. male with PMHx of T2DM, hx of liver transplant, HTN, a-fib, CAD, ESRD on HD presenting with concern for wound infection after right upper extremity AV fistula elevation on 01/15/23. He says he was recovering well from surgery but developed some blistering and redness around his incisions. On exam, there is some erythema around the distal and lateral aspect of his incision with some blistering skin but fistula with strong, palpable thrill. Overall, this is likely caused by irritation from his steri strips and normal skin changes after surgery. Would recommend discontinuing antibiotics and continuing dressing changes with non-adherent dressings as below. Blood cultures were obtained, but if returned positive, much more likely to be the result of a central line-related infection. Clear for discharge from a surgical perspective.    Recommendations:     Please discontinue antibiotics; likely normal skin reaction after surgery and blistering from steri strips and no concern for infection  Continue daily dressing changes with xeroform, gauze, and Kerlix  Clear for discharge from surgical perspective    This patient and plan was discussed with Dr. Darlin Drop    History of Present Illness:   Richard [name] is seen in consultation for c/f wound infection at the request of Dwana Curd, MD on the General Medicine (MED) service.     Richard [name] is a 57 y.o. male with hx of liver transplant, HTN, a-fib, CAD, ESRD on HD presenting with concern for wound infection after right upper extremity AV fistula elevation on 01/15/23.    He says he was recovering well from surgery but developed some redness and blistering at his incision. He called his transplant coordinator and was advised to come to the hospital for evaluation. He denies fevers/chills and other review of systems negative. He denies any drainage from his incision.     Past Medical History:   Diagnosis Date    COVID-19 07/18/2019    Family history of malignant neoplasm of prostate 06/23/2015    Hepatic cirrhosis (CMS-HCC) 06/23/2015    Overview:  Secondary to NASH; followed by Dr. Yevonne Pax, GI.  Last Assessment & Plan:  Relevant Hx: Course: Daily Update: Today's Plan:     Hypertension     Hypogonadism in male 02/12/2014    Liver cirrhosis secondary to NASH (nonalcoholic steatohepatitis) (CMS-HCC)     s/p liver transplant 2013       Past Surgical History:   Procedure Laterality Date    liver transpant      LIVER TRANSPLANTATION  2013    PR AV FIST REVISE GRFT,W THROMBECTOMY Right 01/15/2023    Procedure: REVISION, ARTERIOVENOUS FISTULA W/ THROMBECTOMY, AUTOGENOUS OR NONAUTOGENOUS DIALYSIS GRAFT (SEP. PROC), LOWER EXTREMITY;  Surgeon: Dyann Ruddle, MD;  Location: [facility] OR [facility];  Service: General Surgery    PR COLONOSCOPY W/BIOPSY SINGLE/MULTIPLE N/A 08/13/2018    Procedure: COLONOSCOPY, FLEXIBLE, PROXIMAL TO SPLENIC FLEXURE; WITH BIOPSY, SINGLE OR MULTIPLE;  Surgeon: Neysa Hotter, MD;  Location: [facility];  Service: Gastroenterology    PR COLSC FLX W/RMVL OF TUMOR POLYP LESION SNARE TQ N/A 08/13/2018    Procedure: COLONOSCOPY FLEX; W/REMOV TUMOR/LES BY SNARE;  Surgeon: Neysa Hotter, MD;  Location: GI [facility];  Service: Gastroenterology  PR CREAT AV FISTULA,NON-AUTOGENOUS GRAFT Right 11/29/2022    Procedure: CREATE AV FISTULA (SEPARATE PROC); NONAUTOGENOUS GRAFT (EG, BIOLOGICAL COLLAGEN, THERMOPLASTIC GRAFT), UPPER EXTREMITY;  Surgeon: Dyann Ruddle, MD;  Location: [facility] OR [facility];  Service: General Surgery    PR UPPER GI ENDOSCOPY,BIOPSY N/A 07/13/2015    Procedure: UGI ENDOSCOPY; WITH BIOPSY, SINGLE OR MULTIPLE;  Surgeon: Joaquin Music, MD; Location: GI [facility];  Service: Gastroenterology       Medication:  Current Facility-Administered Medications   Medication Dose Route Frequency Provider Last Rate Last Admin    acetaminophen (TYLENOL) tablet 650 mg  650 mg Oral Q4H PRN Peter Garter, MD        sucralfate (CARAFATE) oral suspension  1 g Oral Q6H PRN Peter Garter, MD        And    aluminum-magnesium hydroxide-simethicone (MAALOX MAX) 80-80-8 mg/mL oral suspension  30 mL Oral Q6H PRN Peter Garter, MD        amlodipine (NORVASC) tablet 10 mg  10 mg Oral Daily Peter Garter, MD        atorvastatin (LIPITOR) tablet 40 mg  40 mg Oral Nightly Peter Garter, MD        calcium carbonate (TUMS) chewable tablet 400 mg elem calcium  400 mg elem calcium Oral BID PRN Peter Garter, MD        carvedilol (COREG) tablet 6.25 mg  6.25 mg Oral BID Peter Garter, MD        cefepime (MAXIPIME) 1 g in sodium chloride 0.9 % (NS) 100 mL IVPB-MBP  1 g Intravenous Q24H SCH Peter Garter, MD        dextrose 50 % in water (D50W) 50 % solution 12.5 g  12.5 g Intravenous Q10 Min PRN Peter Garter, MD        glucagon injection 1 mg  1 mg Intramuscular Once PRN Peter Garter, MD        glucose chewable tablet 16 g  16 g Oral Q10 Min PRN Peter Garter, MD        hydrOXYzine (ATARAX) tablet 25 mg  25 mg Oral Q6H PRN Peter Garter, MD   25 mg at 01/19/23 0308    insulin glargine (LANTUS) injection 12 Units  12 Units Subcutaneous Nightly Peter Garter, MD        insulin lispro (HumaLOG) injection 0-20 Units  0-20 Units Subcutaneous ACHS Peter Garter, MD   8 Units at 01/19/23 0641    insulin lispro (HumaLOG) injection 40 Units  40 Units Subcutaneous TID AC Peter Garter, MD        melatonin tablet 6 mg  6 mg Oral Nightly PRN Peter Garter, MD        [Provider Hold] mycophenolate (CELLCEPT) capsule 250 mg  250 mg Oral BID Peter Garter, MD oxyCODONE (ROXICODONE) immediate release tablet 10 mg  10 mg Oral Q4H PRN Peter Garter, MD        sevelamer (RENVELA) tablet 1,600 mg  1,600 mg Oral 3xd Meals Peter Garter, MD        tacrolimus (PROGRAF) capsule 1 mg  1 mg Oral BID Peter Garter, MD        Vancomycin - Pharmacy dosing by levels   Other Pharmacy dosing Peter Garter, MD           No Known Allergies    Social History:  Social History  Tobacco Use    Smoking status: Former     Passive exposure: Past    Smokeless tobacco: Never   Vaping Use    Vaping status: Never Used   Substance Use Topics    Alcohol use: No    Drug use: No       Family History   Problem Relation Age of Onset    Diabetes Father     Liver disease Maternal Uncle     Diabetes Paternal Grandfather     Melanoma Neg Hx     Basal cell carcinoma Neg Hx     Squamous cell carcinoma Neg Hx     Kidney disease Neg Hx        Review of Systems  10 systems were reviewed and are negative except as noted specifically in the HPI.    Objective:   BP 130/67  - Pulse 70  - Temp 36.3 ??C (97.3 ??F) (Temporal)  - Resp 20  - Ht 177.8 cm (5' 10)  - Wt (!) 116.4 kg (256 lb 9.6 oz)  - SpO2 97%  - BMI 36.82 kg/m??     Intake/Output last 3 shifts:  No intake/output data recorded.    Physical Exam:  Vitals:    01/19/23 0600   BP:    Pulse:    Resp:    Temp: 36.3 ??C (97.3 ??F)   SpO2:       General: well appearing, no acute distress   Neuro: AAO x 3, appropriate to questions  Head: normocephalic, atraumatic, tongue midline  Neck: Supple without masses  Cardiac: RRR   Lungs: Easy work of breathing on room air  Abdomen: Soft, non-distended, non tender  MSK: negative joint tenderness, no deformities  Skin: intact, good turgor  Extremities: Upper extremities warm and perfused. RUE s/p fistula elevation. Fistula with palpable, strong thrill. There is some erythema on the distal, lateral aspect of his incisions with skin blistering. No palpable fluctuance on exam.   Pulses: Bilateral radial pulses palpable.      Most Recent Labs:  Lab Results   Component Value Date    WBC 5.2 01/18/2023    HGB 11.6 (L) 01/18/2023    HCT 35.2 (L) 01/18/2023    PLT 206 01/18/2023       Lab Results   Component Value Date    NA 131 (L) 01/18/2023    K 4.5 01/18/2023    CL 97 (L) 01/18/2023    CO2 27.3 01/18/2023    BUN 19 01/18/2023    CREATININE 6.55 (H) 01/18/2023    CALCIUM 8.9 01/18/2023    MG 2.0 12/04/2022    PHOS 7.3 (H) 12/04/2022       IMAGING:  CTA Upper Extremity W Wo Contrast    Result Date: 01/19/2023  EXAM: CTA Upper Extremity DATE: 01/18/2023 11:22 PM ACCESSION: [id number] UN DICTATED: 01/18/2023 11:35 PM INTERPRETATION LOCATION: MAIN CAMPUS CLINICAL INDICATION: 57 years old Male with recent brachiochepalic fistula creating w surgery, site painful and inflamed, ESRD just dialyzed today  COMPARISON: None. TECHNIQUE: A spiral CTA scan was obtained with IV contrast of the right upper extremity from the shoulder through the elbow. Images were reconstructed in the axial plane. Multiplanar reformatted and MIP images were provided for further evaluation of the vessels. For selected cases, 3D volume rendered images are also provided. VASCULAR FINDINGS: RIGHT: Sequela of brachiocephalic AV fistula creation. There is mild stenosis at the anastomotic site with surrounding phonatory stranding, likely postsurgical. The graft is patent. SUBCLAVIAN:  Patent. AXILLARY: Patent. BRACHIAL: Patent. DEEP BRACHIAL: Patent. VEINS:Normal caliber. Early contrast opacification of the cephalic vein secondary to fistula. Mild narrowing at the anastomosis of brachiocephalic fistula. Nonocclusive filling defect of the central right IJ (6:18). Age indeterminant. NON-VASCULAR FINDINGS: LINES AND TUBES: Partially visualized left-sided approach CVC with tip at the superior cavoatrial junction. LOWER NECK: Unremarkable. LUNGS: Partially visualized and unremarkable. BONES AND SOFT TISSUES: Postsurgical changes of the anterior antecubital region with subcutaneous inflammatory stranding and trace subcutaneous emphysema. No loculated fluid collection.     Postsurgical changes of AV fistula with patent fistula and mild narrowing at the anastomosis, likely postsurgical. Postsurgical changes of the surrounding soft tissues without visualization of abscess. Age-indeterminate nonocclusive thrombus of the imaged portion of the central aspect of the right internal jugular vein.      Teaching Surgeon Attestation:  I, Dyann Ruddle, M.D., saw and evaluated the patient. I have reviewed and edited the above note, and I agree with the findings and the plan of care as documented in the Resident's note. I examined the patient with Dr. Ulyses Southward. This is a very typical appearance of an incision on POD # 4 following an elevation procedure. There is skin reaction to the traction from the steristrips. We removed the steristrips and rewrapped the arm loosely with xeroform, and Kerlix gauze. I do not feel a need for him to stay for IV antibiotics. He is high risk due to immunosuppression, DM, and ESRD. If blood cultures are positive I would be more concerned about a catheter infection.     Montez Hageman, MD  PGY-2, General Surgery  [facility]

## 2023-01-19 NOTE — Unmapped (Signed)
Care Management  Initial Transition Planning Assessment              General  Care Manager assessed the patient by : In person interview with patient, Medical record review  Orientation Level: Oriented X4  Functional level prior to admission: Independent  Reason for referral: Discharge Planning, Other - specify in comments; ROC at HD center    Contact/Decision [facility]  Extended Emergency Contact Information  Primary Emergency Contact: [facility]  Address: [address]  [place], [place] [address]   Mobile Phone: [phone]  Relation: Sister    Legal Next of Kin / Guardian / POA / Advance Directives   HCDM (patient stated preference): [name] - Sister - [phone]    Advance Directive (Medical Treatment)  Does patient have an advance directive covering medical treatment?: Patient does not have advance directive covering medical treatment.  Reason patient does not have an advance directive covering medical treatment:: Patient does not wish to complete one at this time.    Health Care Decision Maker [HCDM] (Medical & Mental Health Treatment)  Healthcare Decision Maker: HCDM documented in the HCDM/Contact Info section.  Information offered on HCDM, Medical & Mental Health advance directives:: Patient declined information.  Referral Made: No    Advance Directive (Mental Health Treatment)  Does patient have an advance directive covering mental health treatment?: Patient does not have advance directive covering mental health treatment.  Reason patient does not have an advance directive covering mental health treatment:: Patient does not wish to complete one at this time.    Readmission Information    Have you been hospitalized in the last 30 days?: No    Patient Information  Lives with: Spouse/significant other; will also be staying with sister     Type of Residence: Private residence  Location/Detail: one story home with 3 steps to enter    Support Systems/Concerns: Family Members, Significant Other    Responsibilities/Dependents at home?: No    Home Care services in place prior to admission?: No      Outpatient/Community Resources in place prior to admission: Other; see info about HD     Equipment Currently Used at Home: none     Currently receiving outpatient dialysis?: Yes  Facility providing dialysis (Name/Contact Info): Chair time T/Th/S at 0520 at [facility], [address] [place], [place] [address]; phone:  [phone] fax:  [phone]    Financial Information     Need for financial assistance?: No       Social Determinants of Health     Financial Resource Strain: Low Risk  (01/19/2023)    Overall Financial Resource Strain (CARDIA)     Difficulty of Paying Living Expenses: Not very hard   Internet Connectivity: Not on file   Food Insecurity: No Food Insecurity (01/19/2023)    Hunger Vital Sign     Worried About Running Out of Food in the Last Year: Never true     Ran Out of Food in the Last Year: Never true   Tobacco Use: Medium Risk (01/18/2023)    Patient History     Smoking Tobacco Use: Former     Smokeless Tobacco Use: Never     Passive Exposure: Past   Housing/Utilities: Low Risk  (01/19/2023)    Housing/Utilities     Within the past 12 months, have you ever stayed: outside, in a car, in a tent, in an overnight shelter, or temporarily in someone else's home (i.e. couch-surfing)?: No     Are you  worried about losing your housing?: No     Within the past 12 months, have you been unable to get utilities (heat, electricity) when it was really needed?: No   Alcohol Use: Not At Risk (09/19/2021)    Alcohol Use     How often do you have a drink containing alcohol?: Never     How many drinks containing alcohol do you have on a typical day when you are drinking?: Not on file     How often do you have 5 or more drinks on one occasion?: Not on file   Transportation Needs: No Transportation Needs (01/19/2023)    PRAPARE - Transportation     Lack of Transportation (Medical): No     Lack of Transportation (Non-Medical): No   Substance Use: Not on file   Health Literacy: Not on file   Physical Activity: Insufficiently Active (07/29/2020)    Received from [facility] visits prior to 08/26/2022.    Exercise Vital Sign     Days of Exercise per Week: 2 days     Minutes of Exercise per Session: 20 min   Interpersonal Safety: Unknown (01/19/2023)    Interpersonal Safety     Unsafe Where You Currently Live: Not on file     Physically Hurt by Anyone: Not on file     Abused by Anyone: Not on file   Stress: No Stress Concern Present (07/29/2020)    Received from [facility] visits prior to 08/26/2022.    [employer] of Occupational Health - Occupational Stress Questionnaire     Feeling of Stress : Not at all   Intimate Partner Violence: Unknown (07/10/2022)    Received from [facility], [facility]    HITS     Physically Hurt: Not on file     Insult or Talk Down To: Not on file     Threaten Physical Harm: Not on file     Scream or Curse: Not on file   Depression: Not at risk (06/30/2022)    PHQ-2     PHQ-2 Score: 0   Social Connections: Unknown (07/10/2022)    Received from [facility], [facility]    Social Network     Social Network: Not on file       Complex Discharge Information    Is patient identified as a difficult/complex discharge?: No    Discharge Needs Assessment  Concerns to be Addressed: discharge planning    Clinical Risk Factors: Multiple Diagnoses (Chronic), Dialysis    Barriers to taking medications: No    Prior overnight hospital stay or ED visit in last 90 days: Yes      Anticipated Changes Related to Illness: none    Equipment Needed After Discharge: none    Discharge Facility/Level of Care Needs:      Readmission  Risk of Unplanned Readmission Score: UNPLANNED READMISSION SCORE: 19.72%  Predictive Model Details          20% (Medium)  Factor Value    Calculated 01/19/2023 08:04 29% Number of active inpatient medication orders 43    Simpson Risk of Unplanned Readmission Model 16% Number of ED visits in last six months 3     9% ECG/EKG order present in last 6 months     7% Encounter of ten days or longer in last year present     7% Charlson Comorbidity Index 6     6% Imaging order present in last 6 months  6% Latest hemoglobin low (11.6 g/dL)     5% Number of hospitalizations in last year 1     4% Age 14     4% Latest creatinine high (6.55 mg/dL)     4% Diagnosis of renal failure present     2% Future appointment scheduled     1% Active ulcer inpatient medication order present     0% Current length of stay 0.32 days      Readmitted Within the Last 30 Days? (No if blank)   Patient at risk for readmission?: No    Discharge Plan  Screen findings are: Discharge planning needs identified or anticipated (Comment).  May be discharging today, unclear whether he will get HD while here.  If so, will send discharge info to his HD center.    Expected Discharge Date: 01/19/2023    Expected Transfer from Critical Care:      Quality data for continuing care services shared with patient and/or representative?: N/A  Patient and/or family were provided with choice of facilities / services that are available and appropriate to meet post hospital care needs?: N/A       Initial Assessment complete?: Yes

## 2023-01-19 NOTE — Unmapped (Signed)
ED Progress Note  I received signout from previous ED physician.     ED I-PASS Handoff  Illness Severit: stable  Patient Summary: Richard [name] is a 57 y.o. male With past medical history of ESRD on hemodialysis Tuesday/Thursday/Saturday, NASH with liver cirrhosis s/p transplant 2013, tacrolimus, type 2 diabetes, HFpEF, CAD with type 2 MI, hypertension presenting with pain swelling and erythema along the site of a AV fistula revision that is completed 3 days ago on 7/22.  Previous team is patient's patient over admission.  CTA results are pending.  Given patient's elevated lactate and concern for possible infection the patient was empirically covered  Action List:   Team assignment  CTA result  Situation Awareness (Contingency Planning): Anticipate admit  Synthesis by Receiver    ED Course as of 01/19/23 0018   Thu Jan 18, 2023   2306 Sign out at 2300.    Fri Jan 19, 2023   0015 CTA Upper Extremity W Wo Contrast  IMPRESSION:     Postsurgical changes of AV fistula with patent fistula and mild narrowing at the anastomosis, likely postsurgical.     Postsurgical changes of the surrounding soft tissues without visualization of abscess.     Age-indeterminate nonocclusive thrombus of the imaged portion of the central aspect of the right internal jugular vein.       Discussion of Management with other Physicians, QHP, or Appropriate Source: Discussion with other professionals: None and Admitting team - admitted without discussion with admit team    Final diagnoses:   Soft tissue infection (Primary)       Vitals:    01/18/23 1716 01/18/23 2204   BP: 140/68 111/55   Pulse: 58 60   Resp: 16 20   Temp: 36.8 ??C (98.2 ??F) 36.7 ??C (98.1 ??F)   TempSrc: Oral Oral   SpO2: 100% 97%       CTA Upper Extremity W Wo Contrast   Final Result      Postsurgical changes of AV fistula with patent fistula and mild narrowing at the anastomosis, likely postsurgical.      Postsurgical changes of the surrounding soft tissues without visualization of abscess.      Age-indeterminate nonocclusive thrombus of the imaged portion of the central aspect of the right internal jugular vein.                Lab Results   Component Value Date    WBC 5.2 01/18/2023    HGB 11.6 (L) 01/18/2023    HCT 35.2 (L) 01/18/2023    PLT 206 01/18/2023       Lab Results   Component Value Date    NA 131 (L) 01/18/2023    K 4.5 01/18/2023    CL 97 (L) 01/18/2023    CO2 27.3 01/18/2023    BUN 19 01/18/2023    CREATININE 6.55 (H) 01/18/2023    GLU 425 (H) 01/18/2023    CALCIUM 8.9 01/18/2023    MG 2.0 12/04/2022    PHOS 7.3 (H) 12/04/2022       Lab Results   Component Value Date    BILITOT 0.7 01/18/2023    BILIDIR 0.50 (H) 12/04/2022    PROT 7.3 01/18/2023    ALBUMIN 3.1 (L) 01/18/2023    ALT 16 01/18/2023    AST 78 (H) 01/18/2023    ALKPHOS 354 (H) 01/18/2023    GGT 105 (H) 11/09/2022       Lab Results   Component Value Date  LABPROT 26.6 01/22/2012    INR 0.99 11/17/2022    APTT 26.1 04/24/2022

## 2023-01-19 NOTE — Unmapped (Signed)
Patient alert and oriented x4, post fistula revision three days ago, general surgery to consult on patient. Vitals stable, ACHS before meals and covered with SS & 40 units humalog. Right upper arm, red and swollen, dressing changed x3 because of patient movement. Awaiting Hematology consult and PVL results. Pt is on regular diet, pain controlled with IV pain medication. Partner at the bedside. Will continue with care.  Problem: Adult Inpatient Plan of Care  Goal: Plan of Care Review  Outcome: Progressing  Flowsheets (Taken 01/19/2023 1358)  Plan of Care Reviewed With: patient  Goal: Patient-Specific Goal (Individualized)  Outcome: Progressing  Goal: Absence of Hospital-Acquired Illness or Injury  Outcome: Progressing  Intervention: Prevent Skin Injury  Recent Flowsheet Documentation  Taken 01/19/2023 1015 by Lenetta Quaker, RN  Positioning for Skin: Supine/Back  Intervention: Prevent and Manage VTE (Venous Thromboembolism) Risk  Recent Flowsheet Documentation  Taken 01/19/2023 1015 by Lenetta Quaker, RN  VTE Prevention/Management: ambulation promoted  Anti-Embolism Intervention: Refused  Goal: Optimal Comfort and Wellbeing  Outcome: Progressing  Goal: Readiness for Transition of Care  Outcome: Progressing  Goal: Rounds/Family Conference  Outcome: Progressing     Problem: Pain Acute  Goal: Optimal Pain Control and Function  Outcome: Progressing     Problem: Fall Injury Risk  Goal: Absence of Fall and Fall-Related Injury  Outcome: Progressing  Intervention: Identify and Manage Contributors  Recent Flowsheet Documentation  Taken 01/19/2023 1015 by Lenetta Quaker, RN  Self-Care Promotion: independence encouraged     Problem: Wound  Goal: Optimal Coping  Outcome: Progressing  Goal: Optimal Functional Ability  Outcome: Progressing  Goal: Absence of Infection Signs and Symptoms  Outcome: Progressing  Goal: Improved Oral Intake  Outcome: Progressing  Goal: Optimal Pain Control and Function  Outcome: Progressing  Goal: Skin Health and Integrity  Outcome: Progressing  Intervention: Optimize Skin Protection  Recent Flowsheet Documentation  Taken 01/19/2023 1015 by Lenetta Quaker, RN  Pressure Reduction Techniques: frequent weight shift encouraged  Goal: Optimal Wound Healing  Outcome: Progressing     Problem: Fall Injury Risk  Goal: Absence of Fall and Fall-Related Injury  Outcome: Progressing  Intervention: Identify and Manage Contributors  Recent Flowsheet Documentation  Taken 01/19/2023 1015 by Lenetta Quaker, RN  Self-Care Promotion: independence encouraged     Problem: Wound  Goal: Optimal Coping  Outcome: Progressing

## 2023-01-19 NOTE — Unmapped (Signed)
Vancomycin Therapeutic Monitoring Pharmacy Note    Richard [name] is a 57 y.o. male starting vancomycin. Date of therapy initiation: 01/18/23    Indication: Skin and Soft Tissue Infection (SSTI)    Prior Dosing Information: None/new initiation     Goals:  Therapeutic Drug Levels  Vancomycin trough goal: 10-15 mg/L    Additional Clinical Monitoring/Outcomes  Renal function, volume status (intake and output)    Results: Not applicable    Wt Readings from Last 1 Encounters:   01/15/23 (!) 119.7 kg (264 lb)     Creatinine   Date Value Ref Range Status   01/18/2023 6.55 (H) 0.73 - 1.18 mg/dL Final   16/03/9603 5.40 (H) 0.73 - 1.18 mg/dL Final   98/04/9146 8.29 (H) 0.73 - 1.18 mg/dL Final        Pharmacokinetic Considerations and Significant Drug Interactions:  Adult (estimated initial): Vd = 85 L  Concurrent nephrotoxic meds: not applicable    Assessment/Plan:  Recommendation(s)  Patient on iHD Tu/Th/Sa. Will give Vanc 2000 mg x 1 dose, then dose by level.  Estimated trough on recommended regimen: Not applicable - dosing by level    Follow-up  Level due: prior to next HD session  A pharmacist will continue to monitor and order levels as appropriate    Please page service pharmacist with questions/clarifications.    Dot Lanes, PharmD

## 2023-01-19 NOTE — Unmapped (Signed)
[facility] Medicine   History and Physical       Assessment and Plan     DELMOS ZAHORIK is a 57 y.o. male who is presenting to [facility] with Soft tissue infection, in the setting of the following pertinent/contributing co-morbidities: ESRD, liver transplant.     1. Soft tissue infection: Illness that poses a threat to life or bodily function without appropriate treatment. Erythema, pain and induration around incision site. No systemic symptoms.   - Cefepime and vancomycin given immune suppression  - Surgery consult in am  - Oxycodone 10mg  PRN pain    Secondary/Additional Active Problems:  2. Type 2 diabetes: Pt uses a CGM, hyperglycemic on arrival to the ED.   - Lantus 12u at bedtime  - Lispro 40u TID with meals     3. S/p Liver transplant in 2013: Hx of liver transplant 11 years ago 2/2 NASH. Pt is on Cellcept and Tacrolimus.   - HOLD cellcept 250mg  iso infection  - Continue tacrolimus 1mg  bid     4. HF, HTN, hx of A-fib and CAD: Pt follows cardiology closely.   - Continue Amlodipine 10mg  daily, Carvedilol 6.25mg  BID  - Spironolactone 25mg  daily    5. ESRD on T/T/S HD: Received full session through chest line Thursday.   - Sevelamer 1600mg  TID    6. Epigastric/throat pain and burning:   - Trial of GI cocktail  - Consider GI consult if pain remains severe and limiting PO intake    Prophylaxis  Ambulation    Diet  NPO at mn    Code Status / HCDM  Full Code    HCDM (patient stated preference): [name] - Sister - [phone]    Significant Comorbid Conditions:  -Chronic kidney disease POA requiring further investigation, treatment, or monitoring    Issues Impacting Complexity of Management:  -Intensive monitoring of drug toxicity from Vancomycin with scheduled BMP and/or Vancomycin levels and Cefepime with scheduled BMP    HPI      EDKER BERGSTEN is a 57 y.o. male who is presenting to [facility] with Soft tissue infection. Patient has a history of ESRD and recently started on dialysis. He had an AVD created on 6/05 and then on 7/22 underwent surgery to elevate the fistula. This was uneventful but yesterday (Wednesday) patient started to experience worsening pain at the surgical site with warmth and redness. He also noted that there were bubbles that had appeared at the actual incision. He has not had fever, chills, malaise but the pain is severe and nothing makes it better. He called the nurse hotline and was instructed to come to the [facility] ER. In the ED he had CTA upper extremity that showed patent fistula and surrounding inflammation (as well as chronic non-occlusive RIJ thrombus). He was started on broad abx and admission was requested.     Med Rec Confidence   I reviewed the Medication List. The current list is Accurate    Physical Exam   Temp:  [36.7 ??C (98.1 ??F)-36.8 ??C (98.2 ??F)] 36.7 ??C (98.1 ??F)  Heart Rate:  [58-60] 60  Resp:  [16-20] 20  BP: (111-140)/(55-68) 111/55  SpO2:  [97 %-100 %] 97 %  There is no height or weight on file to calculate BMI.    General: Well appearing, alert, conversant, cooperative, and in no distress.  Eyes: No scleral icterus. PERRLA.   Cardiovascular: Normal rate, regular rhythm. No murmur. Tunneled line in left chest wall.   Extremities: Warm to touch.  No LE edema. RUE with fresh surgical incision with surrounding erythema, pain, and induration with some small vesicles located on incision.   Respiratory: Normal respiratory effort on room air. Clear to auscultation bilaterally.  Gastrointestinal: Soft, non-tender, non-distended with normoactive bowel sounds. Well healed surgical incision.   Neurologic: Alert and fully oriented. Normal speech and language. Moving all extremities purposefully. No focal deficits.  Skin: Skin is warm, dry and intact. No rash.  Psychiatric: Mood and affect are normal. Speech and behavior are normal.  _________________________________________________________________    Medications     Prior to Admission medications    Medication Dose, Route, Frequency amlodipine (NORVASC) 10 MG tablet 10 mg, Oral, Daily (standard)   atorvastatin (LIPITOR) 40 MG tablet 40 mg, Oral, Nightly, Med to prevent heart attacks and help cholesterol   blood-glucose meter,continuous (DEXCOM G6 RECEIVER) Misc 1 each, Miscellaneous, Daily, Dispense 1 receiver annually.  Sent to [facility]   blood-glucose sensor (DEXCOM G6 SENSOR) Devi Use as directed to monitor blood glucose   blood-glucose sensor (DEXCOM G6 SENSOR) Devi Use to check blood glucose and change every 10 days.   blood-glucose transmitter (DEXCOM G6 TRANSMITTER) Devi Use as directed to monitor blood glucose   blood-glucose transmitter (DEXCOM G6 TRANSMITTER) Devi 1 each, Miscellaneous, Every 3 months, [facility] pharmacy   carvedilol (COREG) 6.25 MG tablet 6.25 mg, Oral, 2 times a day (standard)   CHOLECALCIFEROL, VITAMIN D3, (VITAMIN D3 ORAL) 2,000 Units, Daily (standard)   insulin aspart (NOVOLOG FLEXPEN) 100 unit/mL (3 mL) injection pen 35 Units, Subcutaneous, 3 times a day (AC)   insulin detemir U-100 (LEVEMIR) 100 unit/mL (3 mL) injection pen 18 Units, Subcutaneous, Nightly   magnesium oxide (MAG-OX) 400 mg (241.3 mg elemental magnesium) tablet 400 mg, Oral, Daily (standard)   multivitamin (TAB-A-VITE/THERAGRAN) per tablet 1 tablet, Oral, Daily (standard)   mycophenolate (CELLCEPT) 250 mg capsule 250 mg, Oral, 2 times a day (standard)   oxyCODONE (ROXICODONE) 5 MG immediate release tablet Take 1 tablet (5 mg total) by mouth every four (4) hours as needed for pain.   pantoprazole (PROTONIX) 40 MG tablet 40 mg, Oral, Daily (standard)   pen needle, diabetic 32 gauge x 5/32 (4 mm) Ndle Use with insulin up to 4 times a day as needed.   pen needle, diabetic 32 gauge x 5/32 (4 mm) Ndle Use with insulin up to 4 times/day as needed.   sevelamer (RENVELA) 800 mg tablet 1,600 mg, Oral, 3 times a day (with meals)   sildenafil (VIAGRA) 50 MG tablet 50 mg, Oral, Daily PRN   tacrolimus (PROGRAF) 0.5 MG capsule 1 mg, Oral, 2 times a day Allergies   Patient has no known allergies.     Medical History     Past Medical History:   Diagnosis Date    COVID-19 07/18/2019    Family history of malignant neoplasm of prostate 06/23/2015    Hepatic cirrhosis (CMS-HCC) 06/23/2015    Overview:  Secondary to NASH; followed by Dr. Yevonne Pax, GI.  Last Assessment & Plan:  Relevant Hx: Course: Daily Update: Today's Plan:     Hypertension     Hypogonadism in male 02/12/2014    Liver cirrhosis secondary to NASH (nonalcoholic steatohepatitis) (CMS-HCC)     s/p liver transplant 2013       Social History   Tobacco use:   reports that he has quit smoking. He has been exposed to tobacco smoke. He has never used smokeless tobacco.  Alcohol use:   reports no history of alcohol use.  Drug use:  reports no history of drug use.    Family History     Family History   Problem Relation Age of Onset    Diabetes Father     Liver disease Maternal Uncle     Diabetes Paternal Grandfather     Melanoma Neg Hx     Basal cell carcinoma Neg Hx     Squamous cell carcinoma Neg Hx     Kidney disease Neg Hx        Surgical History     Past Surgical History:   Procedure Laterality Date    liver transpant      LIVER TRANSPLANTATION  2013    PR AV FIST REVISE GRFT,W THROMBECTOMY Right 01/15/2023    Procedure: REVISION, ARTERIOVENOUS FISTULA W/ THROMBECTOMY, AUTOGENOUS OR NONAUTOGENOUS DIALYSIS GRAFT (SEP. PROC), LOWER EXTREMITY;  Surgeon: Dyann Ruddle, MD;  Location: [facility] OR [facility];  Service: General Surgery    PR COLONOSCOPY W/BIOPSY SINGLE/MULTIPLE N/A 08/13/2018    Procedure: COLONOSCOPY, FLEXIBLE, PROXIMAL TO SPLENIC FLEXURE; WITH BIOPSY, SINGLE OR MULTIPLE;  Surgeon: Neysa Hotter, MD;  [facility];  Service: Gastroenterology    PR COLSC FLX W/RMVL OF TUMOR POLYP LESION SNARE TQ N/A 08/13/2018    Procedure: COLONOSCOPY FLEX; W/REMOV TUMOR/LES BY SNARE;  Surgeon: Neysa Hotter, MD;  Location: GI [facility];  Service: Gastroenterology PR CREAT AV FISTULA,NON-AUTOGENOUS GRAFT Right 11/29/2022    Procedure: CREATE AV FISTULA (SEPARATE PROC); NONAUTOGENOUS GRAFT (EG, BIOLOGICAL COLLAGEN, THERMOPLASTIC GRAFT), UPPER EXTREMITY;  Surgeon: Dyann Ruddle, MD;  Location: [facility] OR [facility];  Service: General Surgery    PR UPPER GI ENDOSCOPY,BIOPSY N/A 07/13/2015    Procedure: UGI ENDOSCOPY; WITH BIOPSY, SINGLE OR MULTIPLE;  Surgeon: Joaquin Music, MD;  Location: GI [facility];  Service: Gastroenterology

## 2023-01-19 NOTE — Unmapped (Signed)
Physician Discharge Summary HBR  3 BT1 HBR  [address]  [place] [address]  Dept: [phone]  Loc: [phone]     Identifying Information:   MICHAELRAY WESTERMANN  1966-03-06  [id number]    Primary Care Physician: Mel Almond, MD   Code Status: Full Code    Admit Date: 01/18/2023    Discharge Date: 01/19/2023     Discharge To: Home    Discharge Service: HBR - HBC: Hospitalist Service #2     Discharge Attending Physician: No att. providers found    Discharge Diagnoses:  Principal Problem:    Acute post-operative pain (POA: Unknown)  Active Problems:    Hypertension (POA: Yes)    Type 2 diabetes mellitus with hyperglycemia, with long-term current use of insulin (CMS-HCC) (POA: Not Applicable)    Liver replaced by transplant (CMS-HCC) (POA: Not Applicable)    Immunosuppression due to drug therapy (CMS-HCC) (POA: Not Applicable)    ESRD (end stage renal disease) on dialysis (CMS-HCC) (POA: Not Applicable)    (HFpEF) heart failure with preserved ejection fraction (CMS-HCC) (POA: Yes)    CAD (coronary artery disease) (POA: Yes)  Resolved Problems:    Soft tissue infection (POA: Unknown)      Outpatient Provider Follow Up Issues:   [ ]  Attention to diabetes management  [ ]  Follow-up on anticoagulation (see below)   [ ]  Pain management   [ ]  follow-up blood cultures    Hospital Course:   CHRISTOPHERJOSE JANOSIK is a 57 y.o. male who is presenting to [facility] with acute postoperative pain in the setting of the following pertinent/contributing co-morbidities: ESRD, liver transplant.     He presented to the ED at the recommendations of the nursing hotline after developing increasing warmth, redness, and pain at the site of his recent RUE AVF elevation on 7/22. He was started on broad spectrum antibiotics initially out of concern for post-surgical SSTI. CTA revealed postsurgical inflammation without evidence of abscess and he was evaluated by the surgical team who felt that the findings were consistent with post surgical changes. They recommended discontinuation f antibiotics, non-adherent dressing management, and cleared for discharge from a surgical perspective. He had no leukocytosis, fevers, chills, or other systemic symptoms concerning for infection. He was discharged with a short supply of oxycodone for pain management.     His CTA did incidentally reveal a non-occlussive, chronic thrombus in the RIJ, likely provoked in the setting of prior RIJ temp line at the end of May. PVLs revealed no other evidence of thrombus in the bilateral upper extremities. E-consult was done with hematology who recommended anticoagulation to help treat and preserve vascular access. He was started on apixiban 5 mg BID for at least 1 month (3 months from instigating clot) vs as long as the tunneled HD line remains in place.     He was prescribed a refill of his pantoprazole and an order for PRN MAALOX per request for indigestion.     Procedures:  None  No admission procedures for hospital encounter.  ______________________________________________________________________  Discharge Medications:     Your Medication List        START taking these medications      aluminum-magnesium hydroxide-simethicone 400-400-40 mg/5 mL suspension  Commonly known as: MAALOX MAX  Take 30 mL by mouth every six (6) hours as needed.     ELIQUIS 5 mg Tab  Generic drug: apixaban  Take 1 tablet (5 mg total) by mouth two (2) times a day.  Start  taking on: January 20, 2023            CHANGE how you take these medications      oxyCODONE 10 mg immediate release tablet  Commonly known as: ROXICODONE  Take 1 tablet (10 mg total) by mouth every four (4) hours as needed for pain.  What changed:   medication strength  how much to take            CONTINUE taking these medications      amlodipine 10 MG tablet  Commonly known as: NORVASC  Take 1 tablet (10 mg total) by mouth daily.     atorvastatin 40 MG tablet  Commonly known as: LIPITOR  Take 1 tablet (40 mg total) by mouth nightly. Med to prevent heart attacks and help cholesterol     carvedilol 6.25 MG tablet  Commonly known as: COREG  Take 1 tablet (6.25 mg total) by mouth two (2) times a day.     DEXCOM G6 RECEIVER Misc  Generic drug: blood-glucose meter,continuous  1 each by Miscellaneous route in the morning. Dispense 1 receiver annually.  Sent to [facility].     DEXCOM G6 SENSOR Devi  Generic drug: blood-glucose sensor  Use as directed to monitor blood glucose     DEXCOM G6 SENSOR Devi  Generic drug: blood-glucose sensor  Use to check blood glucose and change every 10 days.     DEXCOM G6 TRANSMITTER Devi  Generic drug: blood-glucose transmitter  Use as directed to monitor blood glucose     DEXCOM G6 TRANSMITTER Devi  Generic drug: blood-glucose transmitter  1 each by Miscellaneous route Every three (3) months. [facility]     LEVEMIR FLEXPEN 100 unit/mL (3 mL) injection pen  Generic drug: insulin detemir U-100  Inject 0.18 mL (18 Units total) under the skin nightly.     magnesium oxide 400 mg (241.3 mg elemental) tablet  Commonly known as: MAG-OX  Take 1 tablet (400 mg total) by mouth daily.     multivitamin per tablet  Commonly known as: TAB-A-VITE/THERAGRAN  Take 1 tablet by mouth daily.     mycophenolate 250 mg capsule  Commonly known as: CELLCEPT  Take 1 capsule (250 mg total) by mouth two (2) times a day.     NovoLOG Flexpen U-100 Insulin 100 unit/mL (3 mL) injection pen  Generic drug: insulin aspart  Inject 0.35 mL (35 Units total) under the skin Three (3) times a day before meals.     pantoprazole 40 MG tablet  Commonly known as: Protonix  Take 1 tablet (40 mg total) by mouth daily.     PROGRAF 0.5 mg capsule  Generic drug: tacrolimus  Take 2 capsules (1 mg total) by mouth two (2) times a day.     sevelamer 800 mg tablet  Commonly known as: RENVELA  Take 2 tablets (1,600 mg total) by mouth Three (3) times a day with a meal.     sildenafil 50 MG tablet  Commonly known as: VIAGRA  Take 1 tablet (50 mg total) by mouth daily as needed for erectile dysfunction.     TRUEPLUS PEN NEEDLE 32 gauge x 5/32 (4 mm) Ndle  Generic drug: pen needle, diabetic  Use with insulin up to 4 times a day as needed.     pen needle, diabetic 32 gauge x 5/32 (4 mm) Ndle  Use with insulin up to 4 times/day as needed.     VITAMIN D3 ORAL  Take 2,000 Units by mouth daily.  Allergies:  Patient has no known allergies.  ______________________________________________________________________  Pending Test Results (if blank, then none):  Pending Labs       Order Current Status    Blood Culture #1 In process    Blood Culture #2 In process            Most Recent Labs:  All lab results last 24 hours -   Recent Results (from the past 24 hour(s))   Comprehensive Metabolic Panel    Collection Time: 01/18/23  9:02 PM   Result Value Ref Range    Sodium 131 (L) 135 - 145 mmol/L    Potassium 4.5 3.4 - 4.8 mmol/L    Chloride 97 (L) 98 - 107 mmol/L    CO2 27.3 20.0 - 31.0 mmol/L    Anion Gap 7 5 - 14 mmol/L    BUN 19 9 - 23 mg/dL    Creatinine 0.98 (H) 0.73 - 1.18 mg/dL    BUN/Creatinine Ratio 3     eGFR CKD-EPI (2021) Male 9 (L) >=60 mL/min/1.78m2    Glucose 425 (H) 70 - 179 mg/dL    Calcium 8.9 8.7 - 11.9 mg/dL    Albumin 3.1 (L) 3.4 - 5.0 g/dL    Total Protein 7.3 5.7 - 8.2 g/dL    Total Bilirubin 0.7 0.3 - 1.2 mg/dL    AST 78 (H) <=14 U/L    ALT 16 10 - 49 U/L    Alkaline Phosphatase 354 (H) 46 - 116 U/L   CBC w/ Differential    Collection Time: 01/18/23  9:02 PM   Result Value Ref Range    WBC 5.2 3.6 - 11.2 10*9/L    RBC 4.06 (L) 4.26 - 5.60 10*12/L    HGB 11.6 (L) 12.9 - 16.5 g/dL    HCT 78.2 (L) 95.6 - 48.0 %    MCV 86.7 77.6 - 95.7 fL    MCH 28.5 25.9 - 32.4 pg    MCHC 32.9 32.0 - 36.0 g/dL    RDW 21.3 08.6 - 57.8 %    MPV 8.2 6.8 - 10.7 fL    Platelet 206 150 - 450 10*9/L    nRBC 0 <=4 /100 WBCs    Neutrophils % 65.0 %    Lymphocytes % 19.2 %    Monocytes % 11.2 %    Eosinophils % 3.9 %    Basophils % 0.7 %    Absolute Neutrophils 3.4 1.8 - 7.8 10*9/L Absolute Lymphocytes 1.0 (L) 1.1 - 3.6 10*9/L    Absolute Monocytes 0.6 0.3 - 0.8 10*9/L    Absolute Eosinophils 0.2 0.0 - 0.5 10*9/L    Absolute Basophils 0.0 0.0 - 0.1 10*9/L   Lactate Sepsis, Venous    Collection Time: 01/18/23  9:02 PM   Result Value Ref Range    Lactate, Venous 2.4 (HH) 0.5 - 1.8 mmol/L   POCT Glucose    Collection Time: 01/19/23  6:30 AM   Result Value Ref Range    Glucose, POC 298 (H) 70 - 179 mg/dL   Lactate, Venous, Whole Blood    Collection Time: 01/19/23  7:05 AM   Result Value Ref Range    Lactate, Venous 2.2 (H) 0.5 - 1.8 mmol/L   POCT Glucose    Collection Time: 01/19/23  8:21 AM   Result Value Ref Range    Glucose, POC 222 (H) 70 - 179 mg/dL   POCT Glucose    Collection Time: 01/19/23 12:09 PM   Result  Value Ref Range    Glucose, POC 461 (HH) 70 - 179 mg/dL   POCT Glucose    Collection Time: 01/19/23  5:04 PM   Result Value Ref Range    Glucose, POC 145 70 - 179 mg/dL       Relevant Studies/Radiology (if blank, then none):  PVL Venous Duplex Upper Extremity Bilateral    Result Date: 01/19/2023   Peripheral Vascular Lab     [address]   [place], [place] [address]  PVL VENOUS DUPLEX UPPER EXTREMITY BILATERAL Patient Demographics Pt. Name: WACEY VARNES Location: [facility] Inpatient MRN:      [id number]         Sex:      M DOB:      02/14/1966         Age:      57 years  Study Information Authorizing         [id number]    Performed Time       01/19/2023 1:24:00 Provider Name       Waleska Buttery                                      PM Ordering Physician  [address] Layli Capshaw     Patient Location     [facility] Accession Number    [id number] UN         Technologist         Marissa                                                                Pachlhofer RVT Diagnosis:                                Assisting                                           Technologist Ordered Reason For Exam: DVT evaluation Other Indication: possible DVT noted on recent CT Risk Factors: Surgery (right brachiocephalic AVF creation 01/15/2023).  Final Interpretation Right Abnormalities consistent with the sequela of a prior venous obstructive process, with findings that appear chronic/long standing in nature are identified in the cephalic vein. Left No evidence of DVT detected in the central veins or arm veins. This was a limited study.  Electronically signed by [id number] Jodell Cipro MD on 01/19/2023 at 3:02:05 PM.  Examination Protocol The internal jugular, brachiocephalic, subclavian, and axillary, brachial, basilic and cephalic veins are routinely assessed on the requested limb. Spectral and Color Doppler data is the primary method for evaluating the brachiocephalic and subclavian veins. Venous compression is used to evaluate the internal jugular, axillary, and upper arm veins. If a unilateral exam is requested, a contralateral subclavian vein Doppler signal is required for comparison.  Limitations: Body habitus, bandages and line.  Duplex Findings Right The cephalic vein at the ACF-wrist is non compressible appearing rigid with compression, retracted and brightly echogenic. All other venous findings in the upper extremity are within normal limits. Left No abnormality of  venous architecture is observed in the central veins. All Doppler signals are appropriately pulsatile with ventilatory excursions. Color Doppler notes appropriate filling in all vessels. The arm veins appear fully compressible. However, this examination was unable to visualize the proximal axillary vein.  Summary of Findings Right Evidence of chronic obstruction visualized in the cephalic vein at the ACF-wrist. All other venous findings in the upper extremity are within normal limits. Left No evidence of obstruction was seen in the central veins or arm veins. However, unable to visualize the proximal axillary vein.   Final     CTA Upper Extremity W Wo Contrast    Result Date: 01/19/2023  EXAM: CTA Upper Extremity DATE: 01/18/2023 11:22 PM ACCESSION: [id number] UN DICTATED: 01/18/2023 11:35 PM INTERPRETATION LOCATION: MAIN CAMPUS CLINICAL INDICATION: 57 years old Male with recent brachiochepalic fistula creating w surgery, site painful and inflamed, ESRD just dialyzed today  COMPARISON: None. TECHNIQUE: A spiral CTA scan was obtained with IV contrast of the right upper extremity from the shoulder through the elbow. Images were reconstructed in the axial plane. Multiplanar reformatted and MIP images were provided for further evaluation of the vessels. For selected cases, 3D volume rendered images are also provided. VASCULAR FINDINGS: RIGHT: Sequela of brachiocephalic AV fistula creation. There is mild stenosis at the anastomotic site with surrounding phonatory stranding, likely postsurgical. The graft is patent. SUBCLAVIAN: Patent. AXILLARY: Patent. BRACHIAL: Patent. DEEP BRACHIAL: Patent. VEINS:Normal caliber. Early contrast opacification of the cephalic vein secondary to fistula. Mild narrowing at the anastomosis of brachiocephalic fistula. Nonocclusive filling defect of the central right IJ (6:18). Age indeterminant. NON-VASCULAR FINDINGS: LINES AND TUBES: Partially visualized left-sided approach CVC with tip at the superior cavoatrial junction. LOWER NECK: Unremarkable. LUNGS: Partially visualized and unremarkable. BONES AND SOFT TISSUES: Postsurgical changes of the anterior antecubital region with subcutaneous inflammatory stranding and trace subcutaneous emphysema. No loculated fluid collection.     Postsurgical changes of AV fistula with patent fistula and mild narrowing at the anastomosis, likely postsurgical. Postsurgical changes of the surrounding soft tissues without visualization of abscess. Age-indeterminate nonocclusive thrombus of the imaged portion of the central aspect of the right internal jugular vein.    ______________________________________________________________________  Discharge Instructions:           Other Instructions       Discharge instructions      It was a pleasure taking care of you!    You were admitted to [facility] for pain at your surgical site.  You were evaluated by the surgeons who felt that this was consistent with postsurgical changes.  Your imaging incidentally discovered an old blood clot in one of the large veins in your neck.  We discussed the plans with the hematology team and recommend that you start a blood thinning medication called Eliquis until you are able to have the tunneled hemodialysis catheter removed.    The following medication changes were made during your hospitalization:  -Start Eliquis 5 mg twice a day  -Your pantoprazole was refilled per request and Maalox was prescribed for indigestion  -A short course of oxycodone was prescribed to help you with the pain    You should follow-up with the following physicians:  -Your primary care physician and your transplant doctors.    Please carefully read and follow these instructions below upon your discharge:    1) Please take your medications as prescribed and note the changes listed on your discharge. At future follow-up appointments, please be sure to take all of your medications  with you so your provider can better guide your care.     2) Seek medical care with your primary care doctor or local Emergency Room or Urgent Care if you develop any changes in your mental status, worsening abdominal pain, fevers greater than 101.5, any unexplained/unrelieved shortness of breath, uncontrolled nausea and vomiting that keeps you from remaining hydrated or taking your medication, or any other concerning symptoms.     3) Please go to your follow-up appointments. Some of your follow-up appointments have been listed below. If you do not see an appointment listed below with your primary care doctor, please call your doctor's office as soon as possible to schedule an appointment to be seen within 7-10 days of discharge.     4) If you have any concerns before you are able to follow-up with your primary care doctor, you can reach [place] by calling [phone] and asking to page the Hospitalist on call.    We wish you all the best!            Follow Up instructions and Outpatient Referrals     Ambulatory Referral to Internal Medicine      Discharge instructions          Appointments which have been scheduled for you      Jan 18, 2023 To Be Determined  E-Visit with Yvone Neu, MD  [facility] VIRTUAL PRACTICE [facility] REGION) [address]  [place] [address]  [phone]        Feb 02, 2023 10:00 AM  (Arrive by 9:30 AM)  RETURN  HEPATOLOGY with Linnell Fulling, ANP  [facility] LIVER TRANSPLANT [facility] REGION) [address]  [place] [address]  [phone]        Feb 06, 2023  UGI ENDOSCOPY; WITH BIOPSY, SINGLE OR MULTIPLE with Chriss Driver, MD  [facility] PERIOP [facility] [address] [facility] REGION) [address]  [address]  [place] [address]  [phone]     Feb 28, 2023 9:30 AM  (Arrive by 9:15 AM)  RETURN VIDEO HCP MYCHART with Philomena Course, RD/LDN  [facility] BARIATRIC NUTRITION SERVICES [facility] REGION) [address]  [address] Floor  [place] [address]  [phone]   Please sign into My [place] Chart at least 15 minutes before your appointment to complete the eCheck-In process. You must complete eCheck-In before you can start your video visit. We also recommend testing your audio and video connection to troubleshoot any issues before your visit begins. Click ???Join Video Visit??? to complete these checks. Once you have completed eCheck-In and tested your audio and video, click ???Join Call??? to connect to your visit.     For your video visit, you will need a computer with a working camera, speaker and microphone, a smartphone, or a tablet with internet access.    My [place] Chart enables you to manage your health, send non-urgent messages to your provider, view your test results, schedule and manage appointments, and request prescription refills securely and conveniently from your computer or mobile device.    You can go to [email or web address] to sign in to your My [place] Chart account with your username and password. If you have forgotten your username or password, please choose the ???Forgot Username???? and/or ???Forgot Password???? links to gain access. You also can access your My [place] Chart account with the free MyChart mobile app for Android or iPhone.  If you need assistance accessing your My [place] Chart account or for assistance in reaching your provider's office to reschedule or cancel your appointment, please call [facility] [phone].         Apr 13, 2023 8:00 AM  (Arrive by 7:45 AM)  NEW GENERAL PCP with Nicoletta Dress, MD  [facility] REGION) [address]  [address]  [place] [address]  [phone]             ______________________________________________________________________  Discharge Day Services:  BP 109/72  - Pulse 63  - Temp 36.4 ??C (97.6 ??F) (Temporal)  - Resp 18  - Ht 177.8 cm (5' 10)  - Wt (!) 116.4 kg (256 lb 9.6 oz)  - SpO2 99%  - BMI 36.82 kg/m??   Pt seen on the day of discharge and determined appropriate for discharge.    Condition at Discharge: fair    Length of Discharge: I spent greater than 30 mins in the discharge of this patient.

## 2023-01-19 NOTE — Unmapped (Signed)
Tacrolimus Therapeutic Monitoring Pharmacy Note    Keithon Caminiti is a 57 y.o. male continuing tacrolimus.     Indication: Liver transplant     Date of Transplant:  03/02/2012       Prior Dosing Information: Current regimen 1 mg BID      Source(s) of information used to determine prior to admission dosing: Home Medication List or Clinic Note    Goals:  Therapeutic Drug Levels  Tacrolimus trough goal:  2-4 ng/mL  (per 09/16/21 Clinic Note; most recent goal level found)    Additional Clinical Monitoring/Outcomes  Monitor renal function (SCr and urine output) and liver function (LFTs)  Monitor for signs/symptoms of adverse events (e.g., hyperglycemia, hyperkalemia, hypomagnesemia, hypertension, headache, tremor)    Results:   Tacrolimus level:  5.2 ng/mL, drawn 12/04/22    Pharmacokinetic Considerations and Significant Drug Interactions:  Concurrent hepatotoxic medications: None identified  Concurrent CYP3A4 substrates/inhibitors: None identified  Concurrent nephrotoxic medications: None identified    Assessment/Plan:  Recommendedation(s)  Continue current regimen of Tacrolimus 1 mg BID.    Follow-up  Next level to be determined by primary team.   A pharmacist will continue to monitor and recommend levels as appropriate    Please page service pharmacist with questions/clarifications.    Dot Lanes, PharmD

## 2023-01-19 NOTE — Unmapped (Addendum)
Pt. Arrived to the unit at 0157 from the ED. See individual note. Right arm incision is reddened, swollen, warm and blistered. Dressing intact. Pt. Complaining of itching.  MD contacted and ordered atarax, which was effective. NPO upon admission.  Continent bowel and bladder. Ambulatory.  Alert and oriented x 4. Daughter at bedside. PIV flushed and maintained. Left HD catheter present and dressing in place.  All safety measures maintained. Surgical consult in AM.     Problem: Adult Inpatient Plan of Care  Goal: Absence of Hospital-Acquired Illness or Injury  Intervention: Identify and Manage Fall Risk  Recent Flowsheet Documentation  Taken 01/19/2023 0157 by Rhina Brackett, RN  Safety Interventions:   fall reduction program maintained   low bed   family at bedside  Intervention: Prevent Skin Injury  Recent Flowsheet Documentation  Taken 01/19/2023 0157 by Rhina Brackett, RN  Positioning for Skin: Supine/Back  Intervention: Prevent and Manage VTE (Venous Thromboembolism) Risk  Recent Flowsheet Documentation  Taken 01/19/2023 0157 by Rhina Brackett, RN  Anti-Embolism Intervention: (low risk) Other (Comment)  Intervention: Prevent Infection  Recent Flowsheet Documentation  Taken 01/19/2023 0157 by Rhina Brackett, RN  Infection Prevention: hand hygiene promoted

## 2023-01-19 NOTE — Unmapped (Signed)
[facility]  Emergency Department Provider Note    History, MDM, ED Course     History: Richard [name] is a 57 y.o. male with a past medical history of ESRD (on hemodialysis Tu/Th/Sa), NASH w/ liver cirrhosis (s/p liver transplant in 2013, on Tacrolimus), T2DM, HFpEF, CAD w/ hx of type 2 MI, HTN who presents with 1 day of increased pain, swelling, and erythema along the site he underwent AV fistula revision 3 days ago (01/15/23). The fistula was initially created on 11/29/22. He has not been febrile at home and he has not noticed any drainage from the surgical site. However, he does note that he has had a headache since this morning. He last received a full dialysis session today through the port on the left side of his chest. He has not yet had dialysis through the AV fistula.     Vitals notable for borderline bradycardia with HR at 58. Otherwise, no noted tachypnea, patient is afebrile. On exam, distal portion of surgical site on RUE with blistering lesions and surrounding erythema that are tender to touch.    Impression/Ddx: 57 year old male with history and physical exam as above.  Differential includes vascular etiology such as hematoma, pseudoaneurysm.  Includes infectious etiology such as cellulitis, abscess, surgical site infection.  Currently hemodynamically stable.  Afebrile.  Will obtain CBC, CMP, lactate.  Will obtain CTA of the upper extremity to assess for vascular etiology.    Progress Notes:  ED Course as of 01/18/23 2319   Thu Jan 18, 2023   2114 Lactate, Venous(!!): 2.4   2255 Discussed with patient contrast and CT imaging.  Patient does still make urine but states this is a small to second stream every other day.  We discussed that receiving contrast may worsen his kidney function to the point that he no longer makes urine at all.  After this discussion the patient is amenable to continuing with CT scan with contrast.   2317 Patient signed out to oncoming provider at this time. Pending discussion with team and CTA.    __________________________________________________________________    The case was discussed with the attending physician who is in agreement with the above assessment and plan.    Additional Medical Decision Making     - Any discussion of this patient's case/presentation between myself and consultants, admitting teams, or other team members has been documented above.  - Imaging and other studies, if performed, that were available during my care of the patient were independently reviewed and interpreted by me and considered in my medical decision making as documented above.  - External records reviewed: General Surgery Notes 01/15/23 for history  - Consideration of admission, observation, transfer, or escalation of care: As above in MDM and ED course.    History     Chief Complaint:   Chief Complaint   Patient presents with    Post-op Problem       History of Present Illness:  As documented above.    Additional history during this encounter provided by: N/A    Past Medical History:  Past Medical History:   Diagnosis Date    COVID-19 07/18/2019    Family history of malignant neoplasm of prostate 06/23/2015    Hepatic cirrhosis (CMS-HCC) 06/23/2015    Overview:  Secondary to NASH; followed by Dr. Yevonne Pax, GI.  Last Assessment & Plan:  Relevant Hx: Course: Daily Update: Today's Plan:     Hypertension     Hypogonadism in male 02/12/2014  Liver cirrhosis secondary to NASH (nonalcoholic steatohepatitis) (CMS-HCC)     s/p liver transplant 2013       Medications:     Current Facility-Administered Medications:     vancomycin (VANCOCIN) 2000 mg in sodium chloride (NS) 0.9% 500 mL IVPB, 2,000 mg, Intravenous, Once, Tawni Levy, MD, Last Rate: 280 mL/hr at 01/18/23 2254, 2,000 mg at 01/18/23 2254    Current Outpatient Medications:     amlodipine (NORVASC) 10 MG tablet, Take 1 tablet (10 mg total) by mouth daily., Disp: 30 tablet, Rfl: 0    atorvastatin (LIPITOR) 40 MG tablet, Take 1 tablet (40 mg total) by mouth nightly. Med to prevent heart attacks and help cholesterol, Disp: 90 tablet, Rfl: 3    blood-glucose meter,continuous (DEXCOM G6 RECEIVER) Misc, 1 each by Miscellaneous route in the morning. Dispense 1 receiver annually.  Sent to [facility]., Disp: 1 each, Rfl: 0    blood-glucose sensor (DEXCOM G6 SENSOR) Devi, Use as directed to monitor blood glucose, Disp: 3 each, Rfl: 0    blood-glucose sensor (DEXCOM G6 SENSOR) Devi, Use to check blood glucose and change every 10 days., Disp: 9 each, Rfl: 3    blood-glucose transmitter (DEXCOM G6 TRANSMITTER) Devi, Use as directed to monitor blood glucose, Disp: 1 each, Rfl: 3    blood-glucose transmitter (DEXCOM G6 TRANSMITTER) Devi, 1 each by Miscellaneous route Every three (3) months. [facility], Disp: , Rfl:     carvedilol (COREG) 6.25 MG tablet, Take 1 tablet (6.25 mg total) by mouth two (2) times a day., Disp: 180 tablet, Rfl: 3    CHOLECALCIFEROL, VITAMIN D3, (VITAMIN D3 ORAL), Take 2,000 Units by mouth daily., Disp: , Rfl:     insulin aspart (NOVOLOG FLEXPEN) 100 unit/mL (3 mL) injection pen, Inject 0.35 mL (35 Units total) under the skin Three (3) times a day before meals., Disp: 60 mL, Rfl: 0    insulin detemir U-100 (LEVEMIR) 100 unit/mL (3 mL) injection pen, Inject 0.18 mL (18 Units total) under the skin nightly., Disp: 15 mL, Rfl: 0    magnesium oxide (MAG-OX) 400 mg (241.3 mg elemental magnesium) tablet, Take 1 tablet (400 mg total) by mouth daily., Disp: , Rfl:     multivitamin (TAB-A-VITE/THERAGRAN) per tablet, Take 1 tablet by mouth daily., Disp: , Rfl:     mycophenolate (CELLCEPT) 250 mg capsule, Take 1 capsule (250 mg total) by mouth two (2) times a day., Disp: 60 capsule, Rfl: 11    oxyCODONE (ROXICODONE) 5 MG immediate release tablet, Take 1 tablet (5 mg total) by mouth every four (4) hours as needed for pain., Disp: 10 tablet, Rfl: 0    pantoprazole (PROTONIX) 40 MG tablet, Take 1 tablet (40 mg total) by mouth daily., Disp: 30 tablet, Rfl: 6    pen needle, diabetic 32 gauge x 5/32 (4 mm) Ndle, Use with insulin up to 4 times a day as needed., Disp: 100 each, Rfl: 0    pen needle, diabetic 32 gauge x 5/32 (4 mm) Ndle, Use with insulin up to 4 times/day as needed., Disp: 100 each, Rfl: 0    sevelamer (RENVELA) 800 mg tablet, Take 2 tablets (1,600 mg total) by mouth Three (3) times a day with a meal., Disp: 180 tablet, Rfl: 0    sildenafil (VIAGRA) 50 MG tablet, Take 1 tablet (50 mg total) by mouth daily as needed for erectile dysfunction., Disp: , Rfl:     tacrolimus (PROGRAF) 0.5 MG capsule, Take 2 capsules (1 mg total) by  mouth two (2) times a day., Disp: 360 capsule, Rfl: 3  Patient's Medications   New Prescriptions    No medications on file   Previous Medications    AMLODIPINE (NORVASC) 10 MG TABLET    Take 1 tablet (10 mg total) by mouth daily.    ATORVASTATIN (LIPITOR) 40 MG TABLET    Take 1 tablet (40 mg total) by mouth nightly. Med to prevent heart attacks and help cholesterol    BLOOD-GLUCOSE METER,CONTINUOUS (DEXCOM G6 RECEIVER) MISC    1 each by Miscellaneous route in the morning. Dispense 1 receiver annually.  Sent to [facility].    BLOOD-GLUCOSE SENSOR (DEXCOM G6 SENSOR) DEVI    Use as directed to monitor blood glucose    BLOOD-GLUCOSE SENSOR (DEXCOM G6 SENSOR) DEVI    Use to check blood glucose and change every 10 days.    BLOOD-GLUCOSE TRANSMITTER (DEXCOM G6 TRANSMITTER) DEVI    Use as directed to monitor blood glucose    BLOOD-GLUCOSE TRANSMITTER (DEXCOM G6 TRANSMITTER) DEVI    1 each by Miscellaneous route Every three (3) months. [facility]    CARVEDILOL (COREG) 6.25 MG TABLET    Take 1 tablet (6.25 mg total) by mouth two (2) times a day.    CHOLECALCIFEROL, VITAMIN D3, (VITAMIN D3 ORAL)    Take 2,000 Units by mouth daily.    INSULIN ASPART (NOVOLOG FLEXPEN) 100 UNIT/ML (3 ML) INJECTION PEN    Inject 0.35 mL (35 Units total) under the skin Three (3) times a day before meals.    INSULIN DETEMIR U-100 (LEVEMIR) 100 UNIT/ML (3 ML) INJECTION PEN    Inject 0.18 mL (18 Units total) under the skin nightly.    MAGNESIUM OXIDE (MAG-OX) 400 MG (241.3 MG ELEMENTAL MAGNESIUM) TABLET    Take 1 tablet (400 mg total) by mouth daily.    MULTIVITAMIN (TAB-A-VITE/THERAGRAN) PER TABLET    Take 1 tablet by mouth daily.    MYCOPHENOLATE (CELLCEPT) 250 MG CAPSULE    Take 1 capsule (250 mg total) by mouth two (2) times a day.    OXYCODONE (ROXICODONE) 5 MG IMMEDIATE RELEASE TABLET    Take 1 tablet (5 mg total) by mouth every four (4) hours as needed for pain.    PANTOPRAZOLE (PROTONIX) 40 MG TABLET    Take 1 tablet (40 mg total) by mouth daily.    PEN NEEDLE, DIABETIC 32 GAUGE X 5/32 (4 MM) NDLE    Use with insulin up to 4 times a day as needed.    PEN NEEDLE, DIABETIC 32 GAUGE X 5/32 (4 MM) NDLE    Use with insulin up to 4 times/day as needed.    SEVELAMER (RENVELA) 800 MG TABLET    Take 2 tablets (1,600 mg total) by mouth Three (3) times a day with a meal.    SILDENAFIL (VIAGRA) 50 MG TABLET    Take 1 tablet (50 mg total) by mouth daily as needed for erectile dysfunction.    TACROLIMUS (PROGRAF) 0.5 MG CAPSULE    Take 2 capsules (1 mg total) by mouth two (2) times a day.   Modified Medications    No medications on file   Discontinued Medications    No medications on file       Allergies:   Patient has no known allergies.    Past Surgical History:   Past Surgical History:   Procedure Laterality Date    liver transpant      LIVER TRANSPLANTATION  2013    PR  AV FIST REVISE GRFT,W THROMBECTOMY Right 01/15/2023    Procedure: REVISION, ARTERIOVENOUS FISTULA W/ THROMBECTOMY, AUTOGENOUS OR NONAUTOGENOUS DIALYSIS GRAFT (SEP. PROC), LOWER EXTREMITY;  Surgeon: Dyann Ruddle, MD;  Location: [facility];  Service: General Surgery    PR COLONOSCOPY W/BIOPSY SINGLE/MULTIPLE N/A 08/13/2018    Procedure: COLONOSCOPY, FLEXIBLE, PROXIMAL TO SPLENIC FLEXURE; WITH BIOPSY, SINGLE OR MULTIPLE;  Surgeon: Neysa Hotter, MD;  [facility];  Service: Gastroenterology    PR COLSC FLX W/RMVL OF TUMOR POLYP LESION SNARE TQ N/A 08/13/2018    Procedure: COLONOSCOPY FLEX; W/REMOV TUMOR/LES BY SNARE;  Surgeon: Neysa Hotter, MD;  [facility]: [facility];  Service: Gastroenterology    PR CREAT AV FISTULA,NON-AUTOGENOUS GRAFT Right 11/29/2022    Procedure: CREATE AV FISTULA (SEPARATE PROC); NONAUTOGENOUS GRAFT (EG, BIOLOGICAL COLLAGEN, THERMOPLASTIC GRAFT), UPPER EXTREMITY;  Surgeon: Dyann Ruddle, MD;  [facility];  Service: General Surgery    PR UPPER GI ENDOSCOPY,BIOPSY N/A 07/13/2015    Procedure: UGI ENDOSCOPY; WITH BIOPSY, SINGLE OR MULTIPLE;  Surgeon: Joaquin Music, MD;  [facility]: [facility];  Service: Gastroenterology       Social History:   Social History     Tobacco Use    Smoking status: Former     Passive exposure: Past    Smokeless tobacco: Never   Substance Use Topics    Alcohol use: No       Family History:  Family History   Problem Relation Age of Onset    Diabetes Father     Liver disease Maternal Uncle     Diabetes Paternal Grandfather     Melanoma Neg Hx     Basal cell carcinoma Neg Hx     Squamous cell carcinoma Neg Hx     Kidney disease Neg Hx         Physical Exam     Vital Signs:    BP 111/55  - Pulse 60  - Temp 36.7 ??C (98.1 ??F) (Oral)  - Resp 20  - SpO2 97%     General: Alert and oriented. Well-appearing and in no distress.  Skin: Skin is warm and well-perfused.  HEENT: Normocephalic and atraumatic, moist mucous membranes, no nasal drainage, no intra-oral lesions or erythema.  Lungs: Normal respiratory effort. Breath sounds clear bilaterally without wheezes, crackles, or rhonchi.  Heart: Normal rate, regular rhythm. No murmurs appreciated.  Abdomen: Soft and non-tender to palpation, non-distended.  Extremities: Distal portion of surgical site on RUE with blistering lesions and surrounding erythema that are tender to touch. Pulses are normal and symmetric in upper and lower extremities.  Neurological: Normal speech and language. No gross focal neurologic deficits are appreciated.  Psychiatric: Mood and affect are normal. Normal speech and behavior.         Radiology     CTA Upper Extremity W Wo Contrast    (Results Pending)       Labs     Labs Reviewed   COMPREHENSIVE METABOLIC PANEL - Abnormal; Notable for the following components:       Result Value    Sodium 131 (*)     Chloride 97 (*)     Creatinine 6.55 (*)     eGFR CKD-EPI (2021) Male 9 (*)     Glucose 425 (*)     Albumin 3.1 (*)     AST 78 (*)     Alkaline Phosphatase 354 (*)     All other  components within normal limits   LACTATE SEPSIS, VENOUS - Abnormal; Notable for the following components:    Lactate, Venous 2.4 (*)     All other components within normal limits   CBC W/ AUTO DIFF - Abnormal; Notable for the following components:    RBC 4.06 (*)     HGB 11.6 (*)     HCT 35.2 (*)     Absolute Lymphocytes 1.0 (*)     All other components within normal limits   BLOOD CULTURE   BLOOD CULTURE   CBC W/ DIFFERENTIAL    Narrative:     The following orders were created for panel order CBC w/ Differential.                  Procedure                               Abnormality         Status                                     ---------                               -----------         ------                                     CBC w/ Differential[[id number]]         Abnormal            Final result                                                 Please view results for these tests on the individual orders.     _____________________________________________________________________    Please note - This documentation was generated using dictation and/or voice recognition software, and as such, may contain spelling or other transcription errors. Any questions regarding the content of this documentation should be directed to the individual who electronically signed.    Documentation assistance was provided by Cherly Hensen, Scribe, on January 18, 2023 at 8:40 PM for Karleen Hampshire, MD.    Documentation assistance was provided by the scribe in my presence.  The documentation recorded by the scribe has been reviewed by me and accurately reflects the services I personally performed.      Tawni Levy, MD  Resident  01/18/23 [phone]

## 2023-01-19 NOTE — Unmapped (Signed)
Pt had fistula surgery on Monday on arm. Today woke up with increasing pain and thinks that surgery site is infected. Afebrile in triage.

## 2023-01-20 MED ORDER — APIXABAN 5 MG TABLET
ORAL_TABLET | Freq: Two times a day (BID) | ORAL | 0 refills | 60 days | Status: CP
Start: 2023-01-20 — End: 2023-03-21
  Filled 2023-01-19: qty 120, 60d supply, fill #0

## 2023-01-20 NOTE — Unmapped (Signed)
This patient was located at [facility] and was not able to be evaluated in person by the hematology consult team. Treatment recommendations below are based on chart review and discussions with the primary team.    Hematology Consult Note     Requesting Attending Physician: Dr. Foy Guadalajara   Service Requesting Consult: Hospital Medicine   Primary Hematologist: None    Reason for Consult: RIJ DVT    Assessment and Plan:  Richard [name] is a 57 y.o. patient with NASH cirrhosis s/p OLT (2013), HFpEF, ESRD, persistent atrial fibrillation not on anticoagulation, who was admitted to [facility] on 01/18/23 for pain, warmth and redness at AV fistula site (Placed 11/29/22 and elevated 01/15/23) concerning for SSTI. Hospital course has been complicated by an incidentally found focal RIJ clot. Hematology was consulted for anticoagulation recommendations.     #RIJ DVT  Data:   - Symptoms: Asymptomatic. Reported pain, warmth and redness of the contralateral arm this admission.   - Imaging:  01/18/23 CTA upper extremity with small non-occlusive filling defect involving RIJ  01/19/23 PVL upper extremity did not visualize the thrombus   - Risk factors: a) RIJ non-tunneled HD catheter placed on 11/23/22. This was eventually replaced with a tunneled LIJ catheter on 12/04/22.   - Thrombophilia workup: Not indicated  - Treatment history: None  Plan  Imaging studies are equivocal (discrepancy between PVL and CTA); however there was a clear instigating event (RIJ non tunneled trialysis catheter placement on 11/23/22), he continues to have a catheter in place on the left, and vascular access is very important for this patient on dialysis. Atrial fibrillation would be another indication for anticoagulation and he is not at especially high bleeding risk. For these reasons we would treat with apixaban 5 mg BID for at least 1 month (3 months out from instigating clot) or as long as the tunneled HD line remains in place. We would not favor the higher 10 mg apixaban dose as this is a chronic clot.     Recommendations:  - Start apixaban 5 mg BID and continue for at least 1 month or as long as the L sided tunneled HD catheter is in place     Case discussed with Dr. Marcheta Grammes, attestation to follow. Recommendations discussed with the primary team.    Napoleon Form, PGY6  Hematology Fellow    Hematology/Oncology Transfer Request: [id number]  Hem/Onc Outpatient call: [phone]  Coagulation/Benign Hematology: [id number]  Malignant Hematology: [id number]   BMT Fellow: [id number]  Oncology/Solid Tumor: [id number]    -----------------------------------------     SUBJECTIVE + PERTINENT EVENTS:    HPI  Richard [name] is a 57 y.o. patient with NASH cirrhosis s/p OLT (2013), HFpEF, ESRD, persistent atrial fibrillation not on anticoagulation, who was admitted to [facility] on 01/18/23 for pain, warmth and redness at AV fistula site (Placed 11/29/22 and elevated 01/15/23) concerning for SSTI. Hospital course has been complicated by an incidentally found focal RIJ clot.    Past medical history:  Past Medical History:   Diagnosis Date    COVID-19 07/18/2019    Family history of malignant neoplasm of prostate 06/23/2015    Hepatic cirrhosis (CMS-HCC) 06/23/2015    Overview:  Secondary to NASH; followed by Dr. Yevonne Pax, GI.  Last Assessment & Plan:  Relevant Hx: Course: Daily Update: Today's Plan:     Hypertension     Hypogonadism in male 02/12/2014    Liver cirrhosis secondary to NASH (nonalcoholic steatohepatitis) (CMS-HCC)     s/p liver  transplant 2013         Family history:   Family History   Problem Relation Age of Onset    Diabetes Father     Liver disease Maternal Uncle     Diabetes Paternal Grandfather     Melanoma Neg Hx     Basal cell carcinoma Neg Hx     Squamous cell carcinoma Neg Hx     Kidney disease Neg Hx          Social history:   Social History     Socioeconomic History    Marital status: Married     Spouse name: None    Number of children: None    Years of education: None    Highest education level: None   Tobacco Use    Smoking status: Former     Passive exposure: Past    Smokeless tobacco: Never   Vaping Use    Vaping status: Never Used   Substance and Sexual Activity    Alcohol use: No    Drug use: No    Sexual activity: Yes     Partners: Female   Other Topics Concern    Do you use sunscreen? No    Tanning bed use? No    Are you easily burned? No    Excessive sun exposure? No    Blistering sunburns? No   Social History Narrative    From [place]    He was in the [employer].    Now lives in [place]    He works in Engineer, petroleum        Married    1 child (age 59.5)     Social Determinants of Health     Financial Resource Strain: Low Risk  (11/17/2022)    Overall Financial Resource Strain (CARDIA)     Difficulty of Paying Living Expenses: Not very hard   Food Insecurity: No Food Insecurity (11/17/2022)    Hunger Vital Sign     Worried About Running Out of Food in the Last Year: Never true     Ran Out of Food in the Last Year: Never true   Transportation Needs: No Transportation Needs (11/17/2022)    PRAPARE - Therapist, art (Medical): No     Lack of Transportation (Non-Medical): No   Physical Activity: Insufficiently Active (07/29/2020)    Received from [facility] visits prior to 08/26/2022.    Exercise Vital Sign     Days of Exercise per Week: 2 days     Minutes of Exercise per Session: 20 min   Stress: No Stress Concern Present (07/29/2020)    Received from [facility] visits prior to 08/26/2022.    [employer] of Occupational Health - Occupational Stress Questionnaire     Feeling of Stress : Not at all    Received from [employer]    Social Network       MEDICATIONS / ALLERGIES:  Medications:   Current Facility-Administered Medications:     acetaminophen (TYLENOL) tablet 650 mg, 650 mg, Oral, Q4H PRN, Peter Garter, MD    sucralfate (CARAFATE) oral suspension, 1 g, Oral, Q6H PRN **AND** aluminum-magnesium hydroxide-simethicone (MAALOX PLUS) 200-200-20 mg/5 mL suspension 60 mL, 60 mL, Oral, QID PRN, Dwana Curd, MD    amlodipine (NORVASC) tablet 10 mg, 10 mg, Oral, Daily, Peter Garter, MD, 10 mg at 01/19/23 0920    atorvastatin (LIPITOR)  tablet 40 mg, 40 mg, Oral, Nightly, Peter Garter, MD    calcium carbonate (TUMS) chewable tablet 400 mg elem calcium, 400 mg elem calcium, Oral, BID PRN, Peter Garter, MD, 400 mg elem calcium at 01/19/23 1227    carvedilol (COREG) tablet 6.25 mg, 6.25 mg, Oral, BID, Peter Garter, MD, 6.25 mg at 01/19/23 0920    dextrose 50 % in water (D50W) 50 % solution 12.5 g, 12.5 g, Intravenous, Q10 Min PRN, Peter Garter, MD    glucagon injection 1 mg, 1 mg, Intramuscular, Once PRN, Peter Garter, MD    glucose chewable tablet 16 g, 16 g, Oral, Q10 Min PRN, Peter Garter, MD    hydrOXYzine (ATARAX) tablet 25 mg, 25 mg, Oral, Q6H PRN, Peter Garter, MD, 25 mg at 01/19/23 0308    insulin glargine (LANTUS) injection 12 Units, 12 Units, Subcutaneous, Nightly, Peter Garter, MD    insulin lispro (HumaLOG) injection 0-20 Units, 0-20 Units, Subcutaneous, ACHS, Peter Garter, MD, 9 Units at 01/19/23 1227    insulin lispro (HumaLOG) injection 40 Units, 40 Units, Subcutaneous, TID Duayne Cal, MD, 40 Units at 01/19/23 1228    melatonin tablet 6 mg, 6 mg, Oral, Nightly PRN, Peter Garter, MD    [Provider Hold] mycophenolate (CELLCEPT) capsule 250 mg, 250 mg, Oral, BID, Peter Garter, MD    oxyCODONE (ROXICODONE) immediate release tablet 10 mg, 10 mg, Oral, Q4H PRN, Peter Garter, MD    pantoprazole (Protonix) EC tablet 40 mg, 40 mg, Oral, Daily, Dwana Curd, MD, 40 mg at 01/19/23 1525    sevelamer (RENVELA) tablet 1,600 mg, 1,600 mg, Oral, 3xd Meals, Peter Garter, MD, 1,600 mg at 01/19/23 1225    tacrolimus (PROGRAF) capsule 1 mg, 1 mg, Oral, BID, Peter Garter, MD, 1 mg at 01/19/23 0900    Current Outpatient Medications:     aluminum-magnesium hydroxide-simethicone (MAALOX MAX) 400-400-40 mg/5 mL suspension, Take 30 mL by mouth every six (6) hours as needed., Disp: 355 mL, Rfl: 0    amlodipine (NORVASC) 10 MG tablet, Take 1 tablet (10 mg total) by mouth daily., Disp: 30 tablet, Rfl: 0    [START ON 01/20/2023] apixaban (ELIQUIS) 5 mg Tab, Take 1 tablet (5 mg total) by mouth two (2) times a day., Disp: 120 tablet, Rfl: 0    atorvastatin (LIPITOR) 40 MG tablet, Take 1 tablet (40 mg total) by mouth nightly. Med to prevent heart attacks and help cholesterol, Disp: 90 tablet, Rfl: 3    blood-glucose meter,continuous (DEXCOM G6 RECEIVER) Misc, 1 each by Miscellaneous route in the morning. Dispense 1 receiver annually.  Sent to [facility]., Disp: 1 each, Rfl: 0    blood-glucose sensor (DEXCOM G6 SENSOR) Devi, Use as directed to monitor blood glucose, Disp: 3 each, Rfl: 0    blood-glucose sensor (DEXCOM G6 SENSOR) Devi, Use to check blood glucose and change every 10 days., Disp: 9 each, Rfl: 3    blood-glucose transmitter (DEXCOM G6 TRANSMITTER) Devi, Use as directed to monitor blood glucose, Disp: 1 each, Rfl: 3    blood-glucose transmitter (DEXCOM G6 TRANSMITTER) Devi, 1 each by Miscellaneous route Every three (3) months. [facility] pharmacy, Disp: , Rfl:     carvedilol (COREG) 6.25 MG tablet, Take 1 tablet (6.25 mg total) by mouth two (2) times a day., Disp: 180 tablet, Rfl: 3    CHOLECALCIFEROL, VITAMIN D3, (VITAMIN D3 ORAL), Take 2,000 Units by mouth daily., Disp: ,  Rfl:     insulin aspart (NOVOLOG FLEXPEN) 100 unit/mL (3 mL) injection pen, Inject 0.35 mL (35 Units total) under the skin Three (3) times a day before meals., Disp: 60 mL, Rfl: 0    insulin detemir U-100 (LEVEMIR) 100 unit/mL (3 mL) injection pen, Inject 0.18 mL (18 Units total) under the skin nightly., Disp: 15 mL, Rfl: 0    magnesium oxide (MAG-OX) 400 mg (241.3 mg elemental magnesium) tablet, Take 1 tablet (400 mg total) by mouth daily., Disp: , Rfl:     multivitamin (TAB-A-VITE/THERAGRAN) per tablet, Take 1 tablet by mouth daily., Disp: , Rfl:     mycophenolate (CELLCEPT) 250 mg capsule, Take 1 capsule (250 mg total) by mouth two (2) times a day., Disp: 60 capsule, Rfl: 11    oxyCODONE (ROXICODONE) 10 mg immediate release tablet, Take 1 tablet (10 mg total) by mouth every four (4) hours as needed for pain., Disp: 15 tablet, Rfl: 0    pantoprazole (PROTONIX) 40 MG tablet, Take 1 tablet (40 mg total) by mouth daily., Disp: 30 tablet, Rfl: 0    pen needle, diabetic 32 gauge x 5/32 (4 mm) Ndle, Use with insulin up to 4 times a day as needed., Disp: 100 each, Rfl: 0    pen needle, diabetic 32 gauge x 5/32 (4 mm) Ndle, Use with insulin up to 4 times/day as needed., Disp: 100 each, Rfl: 0    sevelamer (RENVELA) 800 mg tablet, Take 2 tablets (1,600 mg total) by mouth Three (3) times a day with a meal., Disp: 180 tablet, Rfl: 0    sildenafil (VIAGRA) 50 MG tablet, Take 1 tablet (50 mg total) by mouth daily as needed for erectile dysfunction., Disp: , Rfl:     tacrolimus (PROGRAF) 0.5 MG capsule, Take 2 capsules (1 mg total) by mouth two (2) times a day., Disp: 360 capsule, Rfl: 3      Allergies: No Known Allergies       ROS: Pertinent positives are listed in HPI, all other systems reviewed are negative.     OBJECTIVE:  Vitals:   Vitals:    01/19/23 1700   BP: 109/72   Pulse: 63   Resp: 18   Temp: 36.4 ??C (97.6 ??F)   SpO2: 99%      Physical exam: Not done     LABS / IMAGING:    Results in Past 30 Days  Result Component Current Result Ref Range Previous Result Ref Range   Absolute Lymphocytes 1.0 (L) (01/18/2023) 1.1 - 3.6 10*9/L Not in Time Range    Absolute Neutrophils 3.4 (01/18/2023) 1.8 - 7.8 10*9/L Not in Time Range    Alkaline Phosphatase 354 (H) (01/18/2023) 46 - 116 U/L Not in Time Range    ALT 16 (01/18/2023) 10 - 49 U/L Not in Time Range    AST 78 (H) (01/18/2023) <=34 U/L Not in Time Range    Creatinine 6.55 (H) (01/18/2023) 0.73 - 1.18 mg/dL 4.69 (H) (12/23/5282) 1.32 - 1.18 mg/dL   HGB 44.0 (L) (06/28/7251) 12.9 - 16.5 g/dL Not in Time Range    MCV 86.7 (01/18/2023) 77.6 - 95.7 fL Not in Time Range    Platelet 206 (01/18/2023) 150 - 450 10*9/L Not in Time Range    RDW 14.7 (01/18/2023) 12.2 - 15.2 % Not in Time Range    Total Bilirubin 0.7 (01/18/2023) 0.3 - 1.2 mg/dL Not in Time Range    WBC 5.2 (01/18/2023) 3.6 - 11.2 10*9/L Not in Time Range  Imaging: Reviewed CTA upper extremity with/without contrast which showed RIJ nonocclusive thrombus.

## 2023-01-22 DIAGNOSIS — Z09 Encounter for follow-up examination after completed treatment for conditions other than malignant neoplasm: Principal | ICD-10-CM

## 2023-01-23 MED FILL — TRUEPLUS PEN NEEDLE 32 GAUGE X 5/32" (4 MM): 25 days supply | Qty: 100 | Fill #0

## 2023-02-02 ENCOUNTER — Ambulatory Visit: Admit: 2023-02-02 | Payer: PRIVATE HEALTH INSURANCE

## 2023-02-02 NOTE — Unmapped (Signed)
No show for appointment today. TNC aware

## 2023-02-02 NOTE — Unmapped (Unsigned)
***  PRE-CHARTING *** NOT A COMPLETE NOTE***  [facility]  02/02/2023    Reason for visit: Status post liver transplantation on 03/02/2012 (Liver) (10 years 11 months) for Cirrhosis: Fatty Liver (NASH), seen for follow up    Assessment/Plan:    57 y.o. male who underwent liver transplantation on 03/02/2012 (Liver) for fatty liver.  No history of rejection or biliary issues. 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 He is doing well from a liver standpoint. As far as his intermittent abdominal discomfort I have advised him to take Miralax daily. Imaging in March reported a moderate stool burden.        Liver transplant Immunosuppression  -tacrolimus 0.5 mg BID, last dose 930 pm, goal 2-4, level today pending     Stage IV CKD /HTN:  -followed by [facility] (Dr Rolanda Jay)    Post Transplant health maintenance   Discussed need for yearly full body skin examination, as there is an increased risk of skin cancer with immunosuppression.   Bone health: last Dexascan ***.  Recommend calcium plus vitamin D and Dexascan every 2-3 years  Continue routine age-related cancer screening.   Last colonoscopy: ***  Vaccinations: Recommend the following vaccinations be given   -Hepatitis A:  03/2014, 09/2011  -Hepatitis B: 03/2014, 10/2012, 09/2012  -Influenza (yearly): 111/2023  -Pneumococcal:PPSV23 (05/2016,03/2017) PCV13 (04/2011,03/2016) Prevnar20  -Zoster: Shingrix  (03/27/2017) (03/2018)   -SARS-CoV-2: does not want vaccine  -Tdap: 05/2016    No follow-ups on file.    [name], NP  [facility]  Subjective   History of Present Illness   {Accompanied [phone]  57 y.o. male  who underwent liver transplantation on 03/02/2012 (Liver) (9 years 6 months ago) for Cirrhosis: Fatty Liver (NASH). No history of rejection or biliary issues. 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     Interval history:   Last visit was on 09/16/2021.Marland Kitchen In the interim, ***    Objective   Physical Exam   Vital Signs: There were no vitals taken for this visit.  Constitutional: He is in no apparent distress, with good coloring  HENT: conjunctiva clear, anicteric, nares without discharge, neck supple  CV: Regular rate and rhythm  Lung: respirations even and unlabored  Abdomen: soft, non-distended, non-tender. Chevron incision***  Extremities: No edema, well perfused  Neuro: Grossly intact  Mental Status: Thought organized, appropriate affect, engaged in conversation    {Labs/Imaging (Optional):104318}    {Optional documentation elements (Optional):94651}

## 2023-02-05 DIAGNOSIS — T829XXA Unspecified complication of cardiac and vascular prosthetic device, implant and graft, initial encounter: Principal | ICD-10-CM

## 2023-02-06 ENCOUNTER — Ambulatory Visit: Admit: 2023-02-06 | Discharge: 2023-02-07 | Payer: PRIVATE HEALTH INSURANCE

## 2023-02-06 DIAGNOSIS — T829XXA Unspecified complication of cardiac and vascular prosthetic device, implant and graft, initial encounter: Principal | ICD-10-CM

## 2023-02-06 NOTE — Unmapped (Signed)
He is s/p elevation of a right upper arm cephalic vein AV fistula on 7/22  I had seen him 7/29 for some wound concerns  These have all cleared up  He currently offers no complaints about the right arm  He is having low flow with his TDC and is being scheduled for a catheter exchange  On exam, the right upper arm fistula has a brisk thrill  There is no edema, palpable distal pulses  The long incision is healing normally  I have messaged Dr. Velna Ochs and Dr. Cassie Freer about him  I recommended that they try to cannulate the fistula with 1 needle for the arterial pull.  The catheter can still be used for the venous return.  If this works well for a few treatments then the can progress to 2 needles in the fistula   He will return to me prn

## 2023-02-06 NOTE — Unmapped (Signed)
Pt in for post op LUE av fistula 7/22 .

## 2023-02-07 NOTE — Unmapped (Signed)
[facility] Clinical Intervention    Type of intervention: Medication adherence    Medication involved: Prograf & mycophenolate    Problem identified: patient hasn't filled since 05/2022, had several long hospital stays since then and had extra to last for several months    Intervention performed: messaged coordinator to send in updated Rxs to [facility], will also need to be re-onboarded    Follow-up needed: re-onboard once new rxs received    Approximate time spent: 5-10 minutes    Clinical evidence used to support intervention: Professional judgement    Result of the intervention: Improved medication adherence    Darryl Nestle, PharmD   [facility] Specialty Pharmacist

## 2023-02-07 NOTE — Unmapped (Signed)
[facility] pre procedure prep call completed. Reviewed to [facility] then [facility] for [facility]-in.  Informed of no show/late cancellation policy. NPO guidelines reviewed. Pt OK to take sips of clear liquids with all AM meds.  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 Made aware of visitation policy. Pt verbalized understanding. All questions answered.     Diabetic medication guidelines reviewed.    Planned for driver to drop off/ pick up.  Reenforced need for driver to be present per policy.

## 2023-02-08 DIAGNOSIS — N186 End stage renal disease: Principal | ICD-10-CM

## 2023-02-08 MED ORDER — LIDOCAINE-PRILOCAINE 2.5 %-2.5 % TOPICAL CREAM
Freq: Every day | TOPICAL | 2 refills | 0 days | Status: CP | PRN
Start: 2023-02-08 — End: 2024-02-08

## 2023-02-09 ENCOUNTER — Encounter: Admit: 2023-02-09 | Discharge: 2023-02-09 | Payer: PRIVATE HEALTH INSURANCE

## 2023-02-09 ENCOUNTER — Ambulatory Visit: Admit: 2023-02-09 | Discharge: 2023-02-09 | Payer: PRIVATE HEALTH INSURANCE

## 2023-02-09 ENCOUNTER — Ambulatory Visit: Admit: 2023-02-09 | Discharge: 2023-02-10 | Payer: PRIVATE HEALTH INSURANCE

## 2023-02-09 LAB — MAGNESIUM: MAGNESIUM: 2.1 mg/dL (ref 1.6–2.6)

## 2023-02-09 LAB — BASIC METABOLIC PANEL
ANION GAP: 12 mmol/L (ref 5–14)
BLOOD UREA NITROGEN: 27 mg/dL — ABNORMAL HIGH (ref 9–23)
BUN / CREAT RATIO: 3
CALCIUM: 9.6 mg/dL (ref 8.7–10.4)
CHLORIDE: 100 mmol/L (ref 98–107)
CO2: 24 mmol/L (ref 20.0–31.0)
CREATININE: 8.63 mg/dL — ABNORMAL HIGH
EGFR CKD-EPI (2021) MALE: 7 mL/min/{1.73_m2} — ABNORMAL LOW (ref >=60)
GLUCOSE RANDOM: 265 mg/dL — ABNORMAL HIGH (ref 70–179)
POTASSIUM: 4.2 mmol/L (ref 3.4–4.8)
SODIUM: 136 mmol/L (ref 135–145)

## 2023-02-09 LAB — PHOSPHORUS: PHOSPHORUS: 6.8 mg/dL — ABNORMAL HIGH (ref 2.4–5.1)

## 2023-02-09 MED ADMIN — fentaNYL (PF) (SUBLIMAZE) injection 25 mcg: 25 ug | INTRAVENOUS | @ 20:00:00 | Stop: 2023-02-09

## 2023-02-09 MED ADMIN — ePHEDrine (PF) 25 mg/5 mL (5 mg/mL) in 0.9% sodium chloride syringe Syrg: INTRAVENOUS | @ 19:00:00 | Stop: 2023-02-09

## 2023-02-09 MED ADMIN — fentaNYL (PF) (SUBLIMAZE) injection: INTRAVENOUS | @ 19:00:00 | Stop: 2023-02-09

## 2023-02-09 MED ADMIN — phenylephrine 1 mg/10 mL (100 mcg/mL) injection Syrg: INTRAVENOUS | @ 19:00:00 | Stop: 2023-02-09

## 2023-02-09 MED ADMIN — Propofol (DIPRIVAN) injection: INTRAVENOUS | @ 19:00:00 | Stop: 2023-02-09

## 2023-02-09 MED ADMIN — ceFAZolin (ANCEF) IVPB 2 g in 50 ml dextrose (premix): 2 g | INTRAVENOUS | @ 19:00:00 | Stop: 2023-02-09

## 2023-02-09 MED ADMIN — dexmedeTOMIDine (Precedex) 80 mcg/20 mL (4 mcg/mL) injection: INTRAVENOUS | @ 18:00:00 | Stop: 2023-02-09

## 2023-02-09 MED ADMIN — Propofol (DIPRIVAN) injection: INTRAVENOUS | @ 18:00:00 | Stop: 2023-02-09

## 2023-02-09 MED ADMIN — fentaNYL (PF) (SUBLIMAZE) injection: INTRAVENOUS | @ 18:00:00 | Stop: 2023-02-09

## 2023-02-09 MED ADMIN — HYDROmorphone (PF) (DILAUDID) injection 0.2 mg: .2 mg | INTRAVENOUS | @ 21:00:00 | Stop: 2023-02-09

## 2023-02-09 MED ADMIN — dexmedeTOMIDine (Precedex) 80 mcg/20 mL (4 mcg/mL) injection: INTRAVENOUS | @ 19:00:00 | Stop: 2023-02-09

## 2023-02-09 MED ADMIN — iohexol (OMNIPAQUE) 300 mg iodine/mL solution 5 mL: 5 mL | @ 20:00:00 | Stop: 2023-02-09

## 2023-02-09 MED ADMIN — lidocaine (PF) (XYLOCAINE-MPF) 20 mg/mL (2 %) injection: INTRAVENOUS | @ 18:00:00 | Stop: 2023-02-09

## 2023-02-09 MED ADMIN — glycopyrrolate (ROBINUL) injection: INTRAVENOUS | @ 19:00:00 | Stop: 2023-02-09

## 2023-02-09 MED ADMIN — midazolam (VERSED) injection: INTRAVENOUS | @ 18:00:00 | Stop: 2023-02-09

## 2023-02-09 MED ADMIN — propofol (DIPRIVAN) infusion 10 mg/mL: INTRAVENOUS | @ 18:00:00 | Stop: 2023-02-09

## 2023-02-09 MED ADMIN — lidocaine (PF) (XYLOCAINE-MPF) 10 mg/mL (1 %) injection: INTRADERMAL | @ 19:00:00 | Stop: 2023-02-09

## 2023-02-09 MED ADMIN — oxyCODONE (ROXICODONE) immediate release tablet 5 mg: 5 mg | ORAL | @ 20:00:00 | Stop: 2023-02-23

## 2023-02-09 MED ADMIN — fentaNYL (PF) (SUBLIMAZE) injection 25 mcg: 25 ug | INTRAVENOUS | @ 21:00:00 | Stop: 2023-02-09

## 2023-02-09 NOTE — Unmapped (Signed)
VIR Post-Procedure Note    Procedure Name: Tunneled catheter exchange.     Pre-Op Diagnosis: ESRD on iHD with low flows through his current tunneled LIJ HDC     Post-Op Diagnosis: Same as pre-operative diagnosis    VIR Providers    Attending: Dr. Benjamine Mola  Fellow/Resident: Dr. Kendrick Ranch, Dr. Marisa Severin    Time out: Prior to the procedure, a time out was performed with all team members present. During the time out, the patient, procedure and procedure site when applicable were verbally verified by the team members and Dr. Kendrick Ranch and Dr. Benjamine Mola.    Description of procedure: Successful exchange of a 27cm Hemosplit catheter for a 28 cm Palindrome HD catheter.     Sedation:  MAC     Estimated Blood Loss: Minimal  Specimens: None   Complications: None    Plan:  Catheter ready to use.     See detailed procedure note with images in PACS.    The patient tolerated the procedure well without incident or complication and was transported from VIR in stable condition.    Kendrick Ranch, MD  [facility], PGY-5  02/09/2023 3:42 PM

## 2023-02-09 NOTE — Unmapped (Addendum)
[facility] INTERVENTION[facility]IOLOG[facility] - Pre Procedure H/P  Patient name: Richard [name]  CSN: [id number]  MRN: [id number]  Date of Procedure: @TODAY @      Assessment/Plan:    Richard [name] is a 57 y.o. male who will undergo tunneled hemodialysis catheter exchange vs new placement in Interventional Radiology.    Consent obtained in the Pre Op holding area by Marisa Severin, MD.  Risks, benefits, and alternatives including but not limited to bleeding, infection and trauma to adjacent structures were discussed with patient/patient's representative. All questions were answered to patient/patient's representative satisfaction.  Patient/Patient's representative consents and would like to proceed with the procedure.   --The patient will accept blood products in an emergent situation.  --The patient does not have a Do Not Resuscitate order in effect.      HPI: Richard [name] is a 57 y.o. male with ESRD on iHD with low flows through his current tunneled LIJ HDC. He currently has a RUE AVF that is maturing. He presents for exchange versus new placement of LIJ tunneled hemodialysis catheter.    No central venous obstruction is seen on the left on recent CTA and PVL.     Past Medical History:   Diagnosis Date    COVID-19 07/18/2019    Diabetes mellitus (CMS-HCC)     Family history of malignant neoplasm of prostate 06/23/2015    Hepatic cirrhosis (CMS-HCC) 06/23/2015    Overview:  Secondary to NASH; followed by Dr. Yevonne Pax, GI.  Last Assessment & Plan:  Relevant Hx: Course: Daily Update: Today's Plan:     Hypertension     Hypogonadism in male 02/12/2014    Liver cirrhosis secondary to NASH (nonalcoholic steatohepatitis) (CMS-HCC)     s/p liver transplant 2013       Past Surgical History:   Procedure Laterality Date    liver transpant      LIVER TRANSPLANTATION  2013    PR AV FIST REVISE GRFT,W THROMBECTOMY Right 01/15/2023    Procedure: REVISION, ARTERIOVENOUS FISTULA W/ THROMBECTOMY, AUTOGENOUS OR NONAUTOGENOUS DIALYSIS GRAFT (SEP. PROC), LOWER EXTREMITY;  Surgeon: Dyann Ruddle, MD;  Location: [facility];  Service: General Surgery    PR COLONOSCOPY W/BIOPSY SINGLE/MULTIPLE N/A 08/13/2018    Procedure: COLONOSCOPY, FLEXIBLE, PROXIMAL TO SPLENIC FLEXURE; WITH BIOPSY, SINGLE OR MULTIPLE;  Surgeon: Neysa Hotter, MD;  [facility]: [facility];  Service: Gastroenterology    PR COLSC FLX W/RMVL OF TUMOR POLYP LESION SNARE TQ N/A 08/13/2018    Procedure: COLONOSCOPY FLEX; W/REMOV TUMOR/LES BY SNARE;  Surgeon: Neysa Hotter, MD;  Location: GI [facility];  Service: Gastroenterology    PR CREAT AV FISTULA,NON-AUTOGENOUS GRAFT Right 11/29/2022    Procedure: CREATE AV FISTULA (SEPARATE PROC); NONAUTOGENOUS GRAFT (EG, BIOLOGICAL COLLAGEN, THERMOPLASTIC GRAFT), UPPER EXTREMITY;  Surgeon: Dyann Ruddle, MD;  Location: [facility] OR [facility];  Service: General Surgery    PR UPPER GI ENDOSCOPY,BIOPSY N/A 07/13/2015    Procedure: UGI ENDOSCOPY; WITH BIOPSY, SINGLE OR MULTIPLE;  Surgeon: Joaquin Music, MD;  [facility]: [facility];  Service: Gastroenterology        Allergies: No Known Allergies    Medications:   Eliquis    ASA Grade: ASA 2 - Patient with mild systemic disease with no functional limitations    PE:    Vitals:    02/09/23 1117   BP: 115/64   Pulse: 64   Resp: 17   Temp: 36.7 ??C (98.1 ??F)   SpO2: 99%  General: male in NAD.  Lungs: Respirations nonlabored

## 2023-02-10 NOTE — Unmapped (Signed)
MD offered intervention for patient`s pain but patient declined intervention. Vital signs stable. Discharge instructions reviewed and given to pt. Understanding verbalized. PIV removed with tip intact. Home supplies given for dressing changes. Pt ambulatory without assistance. Discharged from PRU in stable condition.

## 2023-02-10 NOTE — Unmapped (Signed)
VIR Treatment Plan Note:    I was called to Richard [name]'s room for pain management. He states he has 10/10 pain at the line tunnel site which is severe and stabbing. We did not do a new tunnel and his new line is smaller than the old one so I am surprised by this statement. He has received 150 mcg of fentanyl, 1 mg of dilaudid and 5 mg oxycodone since the procedure and states nothing is touching it. He is rolling in the bed in pain. He states he does not routinely take any pain medications at home and the 10 mg oxycodone listed in his chart is from a prior surgery which he does not take. I have offered him more pain medication including more oxycodone since this lasts longer however he doesn't want to take any pills. I have offered to admit him to the [facility] for improved pain management but he is not interested in that either. He has asked to leave. He has recovered from anesthesia and therefore is safe to be discharged from VIR. I told him to call us should he have any future concerns. We will change his dressing before he leaves since it is lifting up from the skin.    Benjamine Mola, MD  Assistant Professor  Interventional Radiology

## 2023-02-13 ENCOUNTER — Ambulatory Visit: Admit: 2023-02-13 | Discharge: 2023-02-13 | Payer: PRIVATE HEALTH INSURANCE

## 2023-02-13 ENCOUNTER — Encounter
Admit: 2023-02-13 | Discharge: 2023-02-13 | Payer: PRIVATE HEALTH INSURANCE | Attending: Certified Registered" | Primary: Certified Registered"

## 2023-02-13 MED ADMIN — sodium chloride (NS) 0.9 % infusion: 10 mL/h | INTRAVENOUS | @ 18:00:00 | Stop: 2023-02-13

## 2023-02-13 MED ADMIN — propofol (DIPRIVAN) infusion 10 mg/mL: INTRAVENOUS | @ 18:00:00 | Stop: 2023-02-13

## 2023-02-13 MED ADMIN — lidocaine (PF) (XYLOCAINE-MPF) 20 mg/mL (2 %) injection: INTRAVENOUS | @ 18:00:00 | Stop: 2023-02-13

## 2023-02-13 MED ADMIN — Propofol (DIPRIVAN) injection: INTRAVENOUS | @ 18:00:00 | Stop: 2023-02-13

## 2023-02-15 NOTE — Unmapped (Signed)
This onboarding is for the following medications:  1) Prograf  2) Cellcept      [facility]   Patient Onboarding/Medication Counseling    [name] is a 57 y.o. male with a liver transplant who I am counseling today on continuation of therapy.  I am speaking to the patient.    Was a Nurse, learning disability used for this call? No    Verified patient's date of birth / HIPAA.    Specialty medication(s) to be sent: Transplant: Patient was unsure what he needed and will call us back.      Non-specialty medications/supplies to be sent: none      Medications not needed at this time: none     The patient declined counseling on missed dose instructions, goals of therapy, side effects and monitoring parameters, warnings and precautions, drug/food interactions, and storage, handling precautions, and disposal because they have taken the medication previously. The information in the declined sections below are for informational purposes only and was not discussed with patient.       Cellcept (mycopheonlate mofetil)    Medication & Administration     Dosage: Take 1  capsule  (350mg ) by mouth two times daily    Administration:   Take by mouth with or without food.   Taking with food can minimize GI side effects.   Swallow capsules whole, do not crush or chew.  Oral suspension should be shaken well prior to administration.  Do not mix with other medications and discard any unused portion 60 days after constitution.      Adherence/Missed dose instructions:  Take a missed dose as soon as you remember it . If it is close to the time of your next dose, skip the missed dose and resume your normal schedule.Never take 2 doses to try and catch up from a missed dose.    Goals of Therapy     Prevent organ rejection    Side Effects & Monitoring Parameters     Feeling tired or weak  Shakiness  Trouble sleeping  Diarrhea, abdominal pain, nausea, vomiting, constipation or decreased appetite  Decreases in blood counts   Back or joint pain  Hypertension or hypotension  High blood sugar  Headache  Skin rash    The following side effects should be reported to the provider:  Reduced immune function - report signs of infection such as fever; chills; body aches; very bad sore throat; ear or sinus pain; cough; more sputum or change in color of sputum; pain with passing urine; wound that will not heal, etc.  Also at a slightly higher risk of some malignancies (mainly skin and blood cancers) due to this reduced immune function.  Allergic reaction (rash, hives, swelling, shortness of breath)  High blood sugar (confusion, feeling sleepy, more thirst, more hungry, passing urine more often, flushing, fast breathing, or breath that smells like fruit)  Electrolyte issues (mood changes, confusion, muscle pain or weakness, a heartbeat that does not feel normal, seizures, not hungry, or very bad upset stomach or throwing up)  High or low blood pressure (bad headache or dizziness, passing out, or change in eyesight)  Kidney issues (unable to pass urine, change in how much urine is passed, blood in the urine, or a big weight gain)  Skin (oozing, heat, swelling, redness, or pain), UTI and other infections   Chest pain or pressure  Abnormal heartbeat  Unexplained bleeding or bruising  Abnormal burning, numbness, or tingling  Muscle cramps,  Yellowing  of skin or eyes    Monitoring parameters  Pregnancy   CBC   Renal and hepatic function    Contraindications, Warnings, & Precautions     *This is a REMS drug and an FDA-approved patient medication guide will be printed with each dispensation  Black Box Warning: Infections   Black Box Warning: Lymphoproliferative disorders - risk of development of lymphoma and skin malignancy is increased  Black Box Warning: Use during pregnancy is associated with increased risks of first trimester pregnancy loss and congenital malformations.   Black Box Warning: Females of reproductive potential should use contraception during treatment and for 6 weeks after therapy is discontinued  Is patient using an effective method of contraception? No  If yes, method of contraception:  na  CNS depression  New or reactivated viral infections  Neutropenia  Male patients and/or their male partners should use effective contraception during treatment of the male patient and for at least 3 months after last dose.  Breastfeeding is not recommended during therapy and for 6 weeks after last dose    Drug/Food Interactions     Medication list reviewed in Epic. The patient was instructed to inform the care team before taking any new medications or supplements. No drug interactions identified.   Separate doses of antacids and this medication  Check with your doctor before getting any vaccinations    Storage, Handling Precautions, & Disposal     Store at room temperature in a dry place  This medication is considered hazardous. Wash hands after handling and store out of reach or others, including children and pets.    The patient declined counseling on missed dose instructions, goals of therapy, side effects and monitoring parameters, warnings and precautions, drug/food interactions, and storage, handling precautions, and disposal because they have taken the medication previously. The information in the declined sections below are for informational purposes only and was not discussed with patient.     Prograf (tacrolimus)    Medication & Administration     Dosage: Take 2 capsules (1mg  total) two times a day.     Administration:   May take with or without food  Take 12 hours apart    Adherence/Missed dose instructions:  Take a missed dose as soon as you think about it.  If it is close to the time for your next dose, skip the missed dose and go back to your normal time.  Do not take 2 doses at the same time or extra doses.    Goals of Therapy     To prevent organ rejection    Side Effects & Monitoring Parameters     Common side effects  Dizziness  Fatigue  Headache  Stuffy nose or sore throat  Nausea, vomiting, stomach pain, diarrhea, constipation  Heartburn  Back or joint pain  Increased risk of infection    The following side effects should be reported to the provider:  Allergic reaction  Kidney issues (change in quantity or urine passed, blood in urine, or weight gain)  High blood pressure (dizziness, change in eyesight, headache)  Electrolyte issues (change in mood, confusion, muscle pain, or weakness)  Abnormal breathing  Shakiness  Unexplained bleeding or bruising (gums bleeding, blood in urine, nosebleeds, any abnormal bleeding)  Signs of infection (fever, cough, wounds that will not heal)  Skin changes (sores, paleness, new or changed bumps or moles)    Monitoring Parameters  Renal function  Liver function  Glucose levels  Blood pressure  Tacrolimus trough  levels  Cardiac monitoring (for QT prolongation)      Contraindications, Warnings, & Precautions     Black Box Warning: Infections - immunosuppressant agents increase the risk of infection that may lead to hospitalization or death  Black Box Warning: Malignancy - immunosuppressant agents may be associated with the development of malignancies that may lead to hospitalization or death  Limit or avoid sun and ultraviolet light exposure, use appropriate sun protection  Myocardial hypertrophy -avoid use in patients with congenital long QT syndrome  Diabetes mellitus - the risk for new-onset diabetes and insulin-dependent post-transplant diabetes mellitus is increased with tacrolimus use after transplantation  GI perforation  Hyperkalemia  Hypertension  Nephrotoxicity  Neurotoxicity  This is a narrow therapeutic index drug. Do not switch manufacturers without first talking to the provider.    Drug/Food Interactions     Medication list reviewed in Epic. The patient was instructed to inform the care team before taking any new medications or supplements. No drug interactions identified.   Avoid alcohol  Avoid grapefruit or grapefruit juice  Avoid live vaccines    Storage, Handling Precautions, & Disposal     Store at room temperature  Keep away from children and pets      Current Medications (including OTC/herbals), Comorbidities and Allergies     Current Outpatient Medications   Medication Sig Dispense Refill    aluminum-magnesium hydroxide-simethicone (MAALOX MAX) 400-400-40 mg/5 mL suspension Take 30 mL by mouth every six (6) hours as needed. 355 mL 0    amlodipine (NORVASC) 10 MG tablet Take 1 tablet (10 mg total) by mouth daily. 30 tablet 0    apixaban (ELIQUIS) 5 mg Tab Take 1 tablet (5 mg total) by mouth two (2) times a day. 120 tablet 0    atorvastatin (LIPITOR) 40 MG tablet Take 1 tablet (40 mg total) by mouth nightly. Med to prevent heart attacks and help cholesterol (Patient not taking: Reported on 02/13/2023) 90 tablet 3    blood-glucose meter,continuous (DEXCOM G6 RECEIVER) Misc 1 each by Miscellaneous route in the morning. Dispense 1 receiver annually.  Sent to [facility]. 1 each 0    blood-glucose sensor (DEXCOM G6 SENSOR) Devi Use as directed to monitor blood glucose 3 each 0    blood-glucose sensor (DEXCOM G6 SENSOR) Devi Use to check blood glucose and change every 10 days. 9 each 3    blood-glucose transmitter (DEXCOM G6 TRANSMITTER) Devi Use as directed to monitor blood glucose 1 each 3    blood-glucose transmitter (DEXCOM G6 TRANSMITTER) Devi 1 each by Miscellaneous route Every three (3) months. [facility]      carvedilol (COREG) 6.25 MG tablet Take 1 tablet (6.25 mg total) by mouth two (2) times a day. 180 tablet 3    CHOLECALCIFEROL, VITAMIN D3, (VITAMIN D3 ORAL) Take 2,000 Units by mouth daily.      insulin aspart (NOVOLOG FLEXPEN) 100 unit/mL (3 mL) injection pen Inject 0.35 mL (35 Units total) under the skin Three (3) times a day before meals. 60 mL 0    insulin detemir U-100 (LEVEMIR) 100 unit/mL (3 mL) injection pen Inject 0.18 mL (18 Units total) under the skin nightly. 15 mL 0    lidocaine-prilocaine (EMLA) 2.5-2.5 % cream Apply topically daily as needed. 30 g 2    magnesium oxide (MAG-OX) 400 mg (241.3 mg elemental magnesium) tablet Take 1 tablet (400 mg total) by mouth daily.      multivitamin (TAB-A-VITE/THERAGRAN) per tablet Take 1 tablet by mouth daily.  mycophenolate (CELLCEPT) 250 mg capsule Take 1 capsule (250 mg total) by mouth two (2) times a day. 60 capsule 11    oxyCODONE (ROXICODONE) 10 mg immediate release tablet Take 1 tablet (10 mg total) by mouth every four (4) hours as needed for pain. (Patient not taking: Reported on 02/13/2023) 15 tablet 0    pantoprazole (PROTONIX) 40 MG tablet Take 1 tablet (40 mg total) by mouth daily. (Patient not taking: Reported on 02/13/2023) 30 tablet 0    pen needle, diabetic 32 gauge x 5/32 (4 mm) Ndle Use with insulin up to 4 times a day as needed. 100 each 0    pen needle, diabetic 32 gauge x 5/32 (4 mm) Ndle Use with insulin up to 4 times/day as needed. 100 each 0    sevelamer (RENVELA) 800 mg tablet Take 2 tablets (1,600 mg total) by mouth Three (3) times a day with a meal. 180 tablet 0    sildenafil (VIAGRA) 50 MG tablet Take 1 tablet (50 mg total) by mouth daily as needed for erectile dysfunction.      tacrolimus (PROGRAF) 0.5 MG capsule Take 2 capsules (1 mg total) by mouth two (2) times a day. 360 capsule 3     No current facility-administered medications for this visit.       No Known Allergies    Patient Active Problem List   Diagnosis    Hypertension    Acanthosis nigricans    Type 2 diabetes mellitus with hyperglycemia, with long-term current use of insulin (CMS-HCC)    H/O gastroesophageal reflux (GERD)    Liver replaced by transplant (CMS-HCC)    Class 3 obesity (CMS-HCC)    Obstructive sleep apnea syndrome    Proteinuria due to type 2 diabetes mellitus (CMS-HCC)    Immunosuppression due to drug therapy (CMS-HCC)    ESRD (end stage renal disease) on dialysis (CMS-HCC)    Type 2 MI (myocardial infarction) (CMS-HCC)    (HFpEF) heart failure with preserved ejection fraction (CMS-HCC)    Severe hyperglycemia due to diabetes mellitus (CMS-HCC)    Cellulitis of right lower extremity    Acute kidney injury superimposed on chronic kidney disease (CMS-HCC)    CAD (coronary artery disease)    Elevated liver enzymes    Hyperphosphatemia    Acute post-operative pain       Reviewed and up to date in Epic.    Appropriateness of Therapy     Acute infections noted within Epic:  No active infections  Patient reported infection: None    Is the medication and dose appropriate based on diagnosis, medication list, comorbidities, allergies, medical history, patient???s ability to self-administer the medication, and therapeutic goals? Yes    Prescription has been clinically reviewed: Yes      Baseline Quality of Life Assessment      How many days over the past month did your liver transplant  keep you from your normal activities? For example, brushing your teeth or getting up in the morning. 0    Financial Information     Medication Assistance provided: None Required    Anticipated copay of $0 each prograf and mycophenolate reviewed with patient. Verified delivery address.    Delivery Information     Scheduled delivery date: Patient will call back to set up delivery date.    Expected start date: Patient has medication at home and is taking.      Medication will be delivered via UPS to the prescription address in [facility]  This shipment will not require a signature.      Explained the services we provide at [facility] and that each month we would call to set up refills.  Stressed importance of returning phone calls so that we could ensure they receive their medications in time each month.  Informed patient that we should be setting up refills 7-10 days prior to when they will run out of medication.  A pharmacist will reach out to perform a clinical assessment periodically.  Informed patient that a welcome packet, containing information about our pharmacy and other support services, a Notice of Privacy Practices, and a drug information handout will be sent.      The patient or caregiver noted above participated in the development of this care plan and knows that they can request review of or adjustments to the care plan at any time.      Patient or caregiver verbalized understanding of the above information as well as how to contact the pharmacy at [phone] option 4 with any questions/concerns.  The pharmacy is open Monday through Friday 8:30am-4:30pm.  A pharmacist is available 24/7 via pager to answer any clinical questions they may have.    Patient Specific Needs     Does the patient have any physical, cognitive, or cultural barriers? No    Does the patient have adequate living arrangements? (i.e. the ability to store and take their medication appropriately) Yes    Did you identify any home environmental safety or security hazards? No    Patient prefers to have medications discussed with  Patient     Is the patient or caregiver able to read and understand education materials at a high school level or above? Yes    Patient's primary language is  English     Is the patient high risk? Yes, patient is taking a REMS drug. Medication is dispensed in compliance with REMS program    SOCIAL DETERMINANTS OF HEALTH     At the [facility], we have learned that life circumstances - like trouble affording food, housing, utilities, or transportation can affect the health of many of our patients.   That is why we wanted to ask: are you currently experiencing any life circumstances that are negatively impacting your health and/or quality of life? Patient declined to answer    Social Determinants of Health     Financial Resource Strain: Low Risk  (11/17/2022)    Overall Financial Resource Strain (CARDIA)     Difficulty of Paying Living Expenses: Not very hard   Internet Connectivity: Not on file   Food Insecurity: No Food Insecurity (11/17/2022)    Hunger Vital Sign     Worried About Running Out of Food in the Last Year: Never true     Ran Out of Food in the Last Year: Never true   Tobacco Use: Medium Risk (02/13/2023)    Patient History     Smoking Tobacco Use: Former     Smokeless Tobacco Use: Never     Passive Exposure: Past   Housing/Utilities: Low Risk  (11/17/2022)    Housing/Utilities     Within the past 12 months, have you ever stayed: outside, in a car, in a tent, in an overnight shelter, or temporarily in someone else's home (i.e. couch-surfing)?: No     Are you worried about losing your housing?: No     Within the past 12 months, have you been unable to get utilities (heat, electricity) when  it was really needed?: No   Alcohol Use: Not At Risk (09/19/2021)    Alcohol Use     How often do you have a drink containing alcohol?: Never     How many drinks containing alcohol do you have on a typical day when you are drinking?: Not on file     How often do you have 5 or more drinks on one occasion?: Not on file   Transportation Needs: No Transportation Needs (11/17/2022)    PRAPARE - Transportation     Lack of Transportation (Medical): No     Lack of Transportation (Non-Medical): No   Substance Use: Not on file   Health Literacy: Not on file   Physical Activity: Insufficiently Active (07/29/2020)    Received from [facility] visits prior to 08/26/2022., [facility] visits prior to 08/26/2022.    Exercise Vital Sign     Days of Exercise per Week: 2 days     Minutes of Exercise per Session: 20 min   Interpersonal Safety: Unknown (02/15/2023)    Interpersonal Safety     Unsafe Where You Currently Live: Not on file     Physically Hurt by Anyone: Not on file     Abused by Anyone: Not on file   Stress: No Stress Concern Present (07/29/2020)    Received from [facility] visits prior to 08/26/2022., [facility] visits prior to 08/26/2022.    [employer] of Occupational [employer] - Occupational Stress Questionnaire     Feeling of Stress : Not at all   Intimate Partner Violence: Unknown (07/10/2022)    Received from [facility], [facility]    HITS     Physically Hurt: Not on file     Insult or Talk Down To: Not on file     Threaten Physical Harm: Not on file     Scream or Curse: Not on file   Depression: Not at risk (06/30/2022)    PHQ-2     PHQ-2 Score: 0   Social Connections: Unknown (07/10/2022)    Received from [facility], [facility]    Social Network     Social Network: Not on file       Would you be willing to receive help with any of the needs that you have identified today? Not applicable       Tera Helper, [facility]  [facility] Specialty Pharmacist

## 2023-02-16 DIAGNOSIS — Z796 Long-term use of immunosuppressant medication: Principal | ICD-10-CM

## 2023-02-16 DIAGNOSIS — Z944 Liver transplant status: Principal | ICD-10-CM

## 2023-02-16 DIAGNOSIS — I10 Essential (primary) hypertension: Principal | ICD-10-CM

## 2023-02-16 DIAGNOSIS — K219 Gastro-esophageal reflux disease without esophagitis: Principal | ICD-10-CM

## 2023-02-16 MED ORDER — TACROLIMUS 0.5 MG CAPSULE, IMMEDIATE-RELEASE
ORAL_CAPSULE | Freq: Two times a day (BID) | ORAL | 3 refills | 90 days | Status: CP
Start: 2023-02-16 — End: ?
  Filled 2023-02-19: qty 120, 30d supply, fill #0

## 2023-02-16 MED ORDER — AMLODIPINE 10 MG TABLET
ORAL | 3 refills | 90 days | Status: CP
Start: 2023-02-16 — End: ?
  Filled 2023-02-19: qty 90, 90d supply, fill #0

## 2023-02-16 MED ORDER — MYCOPHENOLATE MOFETIL 250 MG CAPSULE
ORAL_CAPSULE | Freq: Two times a day (BID) | ORAL | 11 refills | 30 days | Status: CP
Start: 2023-02-16 — End: ?
  Filled 2023-02-19: qty 60, 30d supply, fill #0

## 2023-02-16 MED ORDER — PANTOPRAZOLE 40 MG TABLET,DELAYED RELEASE
ORAL_TABLET | Freq: Every day | ORAL | 0 refills | 30 days | Status: CP
Start: 2023-02-16 — End: 2023-03-18
  Filled 2023-02-19: qty 30, 30d supply, fill #0

## 2023-02-16 NOTE — Unmapped (Signed)
[facility] Clinical Assessment & Refill Coordination Note    Richard [name], DOB: Oct 11, 1965  Phone: [phone] (home)     All above HIPAA information was verified with patient.     Was a Nurse, learning disability used for this call? No    Specialty Medication(s):   Transplant: mycophenolate mofetil 250mg  and Prograf 0.5mg      Current Outpatient Medications   Medication Sig Dispense Refill    aluminum-magnesium hydroxide-simethicone (MAALOX MAX) 400-400-40 mg/5 mL suspension Take 30 mL by mouth every six (6) hours as needed. 355 mL 0    amlodipine (NORVASC) 10 MG tablet Take 1 tablet (10 mg total) by mouth daily. 30 tablet 0    apixaban (ELIQUIS) 5 mg Tab Take 1 tablet (5 mg total) by mouth two (2) times a day. 120 tablet 0    atorvastatin (LIPITOR) 40 MG tablet Take 1 tablet (40 mg total) by mouth nightly. Med to prevent heart attacks and help cholesterol (Patient not taking: Reported on 02/13/2023) 90 tablet 3    blood-glucose meter,continuous (DEXCOM G6 RECEIVER) Misc 1 each by Miscellaneous route in the morning. Dispense 1 receiver annually.  Sent to [facility]. 1 each 0    blood-glucose sensor (DEXCOM G6 SENSOR) Devi Use as directed to monitor blood glucose 3 each 0    blood-glucose sensor (DEXCOM G6 SENSOR) Devi Use to check blood glucose and change every 10 days. 9 each 3    blood-glucose transmitter (DEXCOM G6 TRANSMITTER) Devi Use as directed to monitor blood glucose 1 each 3    blood-glucose transmitter (DEXCOM G6 TRANSMITTER) Devi 1 each by Miscellaneous route Every three (3) months. [facility]      carvedilol (COREG) 6.25 MG tablet Take 1 tablet (6.25 mg total) by mouth two (2) times a day. 180 tablet 3    CHOLECALCIFEROL, VITAMIN D3, (VITAMIN D3 ORAL) Take 2,000 Units by mouth daily.      insulin aspart (NOVOLOG FLEXPEN) 100 unit/mL (3 mL) injection pen Inject 0.35 mL (35 Units total) under the skin Three (3) times a day before meals. 60 mL 0    insulin detemir U-100 (LEVEMIR) 100 unit/mL (3 mL) injection pen Inject 0.18 mL (18 Units total) under the skin nightly. 15 mL 0    lidocaine-prilocaine (EMLA) 2.5-2.5 % cream Apply topically daily as needed. 30 g 2    magnesium oxide (MAG-OX) 400 mg (241.3 mg elemental magnesium) tablet Take 1 tablet (400 mg total) by mouth daily.      multivitamin (TAB-A-VITE/THERAGRAN) per tablet Take 1 tablet by mouth daily.      mycophenolate (CELLCEPT) 250 mg capsule Take 1 capsule (250 mg total) by mouth two (2) times a day. 60 capsule 11    oxyCODONE (ROXICODONE) 10 mg immediate release tablet Take 1 tablet (10 mg total) by mouth every four (4) hours as needed for pain. (Patient not taking: Reported on 02/13/2023) 15 tablet 0    pantoprazole (PROTONIX) 40 MG tablet Take 1 tablet (40 mg total) by mouth daily. (Patient not taking: Reported on 02/13/2023) 30 tablet 0    pen needle, diabetic 32 gauge x 5/32 (4 mm) Ndle Use with insulin up to 4 times a day as needed. 100 each 0    pen needle, diabetic 32 gauge x 5/32 (4 mm) Ndle Use with insulin up to 4 times/day as needed. 100 each 0    sevelamer (RENVELA) 800 mg tablet Take 2 tablets (1,600 mg total) by mouth Three (3) times a day  with a meal. 180 tablet 0    sildenafil (VIAGRA) 50 MG tablet Take 1 tablet (50 mg total) by mouth daily as needed for erectile dysfunction.      tacrolimus (PROGRAF) 0.5 MG capsule Take 2 capsules (1 mg total) by mouth two (2) times a day. 360 capsule 3     No current facility-administered medications for this visit.        Changes to medications: Ebin reports no changes at this time.    No Known Allergies    Changes to allergies: No    SPECIALTY MEDICATION ADHERENCE     Mycophenolate 250 mg: 5 days of medicine on hand   Prograf 0.5 mg: 5 days of medicine on hand     Medication Adherence    Patient reported X missed doses in the last month: 0  Specialty Medication: Prograf 0.5mg   Patient is on additional specialty medications: Yes  Additional Specialty Medications: Mycophenolate 250mg   Patient Reported Additional Medication X Missed Doses in the Last Month: 0  Patient is on more than two specialty medications: No  Adherence tools used: patient uses a pill box to manage medications          Specialty medication(s) dose(s) confirmed: Regimen is correct and unchanged.     Are there any concerns with adherence? No    Adherence counseling provided? Not needed    CLINICAL MANAGEMENT AND INTERVENTION      Clinical Benefit Assessment:    Do you feel the medicine is effective or helping your condition? Yes    Clinical Benefit counseling provided? Not needed    Adverse Effects Assessment:    Are you experiencing any side effects? No    Are you experiencing difficulty administering your medicine? No    Quality of Life Assessment:    Quality of Life    Rheumatology  Oncology  Dermatology  Cystic Fibrosis          How many days over the past month did your liver transplant  keep you from your normal activities? For example, brushing your teeth or getting up in the morning. 0    Have you discussed this with your provider? Not needed    Acute Infection Status:    Acute infections noted within Epic:  No active infections  Patient reported infection: None    Therapy Appropriateness:    Is therapy appropriate based on current medication list, adverse reactions, adherence, clinical benefit and progress toward achieving therapeutic goals? Yes, therapy is appropriate and should be continued     DISEASE/MEDICATION-SPECIFIC INFORMATION      N/A    Solid Organ Transplant: Not Applicable    PATIENT SPECIFIC NEEDS     Does the patient have any physical, cognitive, or cultural barriers? No    Is the patient high risk? Yes, patient is taking a REMS drug. Medication is dispensed in compliance with REMS program    Did the patient require a clinical intervention? No    Does the patient require physician intervention or other additional services (i.e., nutrition, smoking cessation, social work)? No    SOCIAL DETERMINANTS OF HEALTH     At the [facility], we have learned that life circumstances - like trouble affording food, housing, utilities, or transportation can affect the health of many of our patients.   That is why we wanted to ask: are you currently experiencing any life circumstances that are negatively impacting your health and/or quality of life? Patient declined to answer  Social Determinants of Health     Financial Resource Strain: Low Risk  (11/17/2022)    Overall Financial Resource Strain (CARDIA)     Difficulty of Paying Living Expenses: Not very hard   Internet Connectivity: Not on file   Food Insecurity: No Food Insecurity (11/17/2022)    Hunger Vital Sign     Worried About Running Out of Food in the Last Year: Never true     Ran Out of Food in the Last Year: Never true   Tobacco Use: Medium Risk (02/13/2023)    Patient History     Smoking Tobacco Use: Former     Smokeless Tobacco Use: Never     Passive Exposure: Past   Housing/Utilities: Low Risk  (11/17/2022)    Housing/Utilities     Within the past 12 months, have you ever stayed: outside, in a car, in a tent, in an overnight shelter, or temporarily in someone else's home (i.e. couch-surfing)?: No     Are you worried about losing your housing?: No     Within the past 12 months, have you been unable to get utilities (heat, electricity) when it was really needed?: No   Alcohol Use: Not At Risk (09/19/2021)    Alcohol Use     How often do you have a drink containing alcohol?: Never     How many drinks containing alcohol do you have on a typical day when you are drinking?: Not on file     How often do you have 5 or more drinks on one occasion?: Not on file   Transportation Needs: No Transportation Needs (11/17/2022)    PRAPARE - Transportation     Lack of Transportation (Medical): No     Lack of Transportation (Non-Medical): No   Substance Use: Not on file   Health Literacy: Not on file   Physical Activity: Insufficiently Active (07/29/2020)    Received from [facility] visits prior to 08/26/2022., [facility] visits prior to 08/26/2022.    Exercise Vital Sign     Days of Exercise per Week: 2 days     Minutes of Exercise per Session: 20 min   Interpersonal Safety: Unknown (02/16/2023)    Interpersonal Safety     Unsafe Where You Currently Live: Not on file     Physically Hurt by Anyone: Not on file     Abused by Anyone: Not on file   Stress: No Stress Concern Present (07/29/2020)    Received from [facility] visits prior to 08/26/2022., [facility] visits prior to 08/26/2022.    [employer] of Occupational Health - Occupational Stress Questionnaire     Feeling of Stress : Not at all   Intimate Partner Violence: Unknown (07/10/2022)    Received from [facility], [facility]    HITS     Physically Hurt: Not on file     Insult or Talk Down To: Not on file     Threaten Physical Harm: Not on file     Scream or Curse: Not on file   Depression: Not at risk (06/30/2022)    PHQ-2     PHQ-2 Score: 0   Social Connections: Unknown (07/10/2022)    Received from [facility], [facility]    Social Network     Social Network: Not on file       Would you be willing to receive help with any of the needs that you  have identified today? Not applicable       SHIPPING     Specialty Medication(s) to be Shipped:   Transplant: mycophenolate mofetil 250mg  and Prograf 0.5mg     Other medication(s) to be shipped:  amlodipine, pantoprazole     Changes to insurance: No    Delivery Scheduled: Yes, Expected medication delivery date: 02/20/23.     Medication will be delivered via UPS to the confirmed temporary address in [facility].    The patient will receive a drug information handout for each medication shipped and additional FDA Medication Guides as required.  Verified that patient has previously received a Conservation officer, historic buildings and a Surveyor, mining.    The patient or caregiver noted above participated in the development of this care plan and knows that they can request review of or adjustments to the care plan at any time.      All of the patient's questions and concerns have been addressed.    Tera Helper, [facility]   [facility] Shared [facility] Specialty Pharmacist

## 2023-02-28 ENCOUNTER — Ambulatory Visit: Admit: 2023-02-28 | Payer: PRIVATE HEALTH INSURANCE | Attending: Registered" | Primary: Registered"

## 2023-02-28 NOTE — Unmapped (Signed)
Brief RD Telephone Note:    RD attempted to reach patient for scheduled virtual bariatric pre op visit. Patient did not enter the virtual room, but did answer attempted phone call. Patient states he does not know how to do video visits. Patient reports he continues to do well losing weight on his own and is down to 248#. Patient wishes to see how far he can get on his own with weight loss without surgical intervention. RD advised patient to touch base with his transplant team to see if he can be made active on the kidney transplant list. Patient has intentionally lost a significant 19.1kg (45.5%) in less than 4 months based on patient reported current weight of 112.7kg (248#).

## 2023-03-05 NOTE — Unmapped (Signed)
TRF

## 2023-03-06 DIAGNOSIS — E1165 Type 2 diabetes mellitus with hyperglycemia: Principal | ICD-10-CM

## 2023-03-06 DIAGNOSIS — Z794 Long term (current) use of insulin: Principal | ICD-10-CM

## 2023-03-06 NOTE — Unmapped (Signed)
Spoke with [name] who stated patient will not let them stick him as area is so sore.  I was then transferred to [facility].  She states they tried on 8/31 to access his fistula, but after 2 sticks, he refused to continue. She reports he does have a bruise in the area.  He has since refused to attempt fistula access. He keeps stating he would like to continue resting the site.  He also stated to [facility] staff that he will need to be put to sleep if a needle is involved.    Since 8/31, they have gone back to using his chest catheter left chest. This was last used today, but per [facility], it is not working wll and keeps alarming.    NP, Lucy with [employer] saw him today and suggest he be seen by Dr. Darlin Drop as he is not willing to try fistula access until seen again.      Appt scheduled with Dr. Darlin Drop. Patient is aware.

## 2023-03-13 ENCOUNTER — Ambulatory Visit: Admit: 2023-03-13 | Discharge: 2023-03-14 | Payer: PRIVATE HEALTH INSURANCE

## 2023-03-13 DIAGNOSIS — N186 End stage renal disease: Principal | ICD-10-CM

## 2023-03-13 MED ORDER — LIDOCAINE-PRILOCAINE 2.5 %-2.5 % TOPICAL CREAM
Freq: Every day | TOPICAL | 3 refills | 0 days | Status: CP | PRN
Start: 2023-03-13 — End: 2024-03-12

## 2023-03-13 NOTE — Unmapped (Signed)
2 weeks for ongoing pain in AV Fistula, Right    Clinical Staff Clinic Rooming for ESRD Patients for Dr. Dyann Ruddle    Name of Dialysis Center [facility]  Attending Nephrologist: Clarita Leber MD/ Mel Almond PCP    Dialysis Weekly Schedule  []  Monday  [x]  Tuesday  []  Wednesday  [x]  Thursday  []  Friday  [x]  Saturday  []  Sunday

## 2023-03-13 NOTE — Unmapped (Signed)
He returns for me to check his AV fistula  This is a right brachiocephalic AVF created 11/29/2022 and elevated 01/15/2023  I last saw him 8/13 and gave permission to start using the fistula  He developed an infiltration and the catheter is still in place and being used  He refused to allow cannulation of the fistula until I checked it  On exam, the infiltration has resolved and the arm looks normal, there is no edema or ecchymosis. The incision is very nicely healed  The fistula has a brisk thrill and is superficial and stands out well  It appears to me that it should be easier to cannulate now and that it should be cannulated for hemodialysis  Hopefully this will be without problems and he can have his catheter removed shortly  I will plan to see him back prn

## 2023-03-29 DIAGNOSIS — E119 Type 2 diabetes mellitus without complications: Principal | ICD-10-CM

## 2023-03-29 DIAGNOSIS — N186 End stage renal disease: Principal | ICD-10-CM

## 2023-03-29 DIAGNOSIS — Z794 Long term (current) use of insulin: Principal | ICD-10-CM

## 2023-03-29 MED ORDER — LIDOCAINE-PRILOCAINE 2.5 %-2.5 % TOPICAL CREAM
Freq: Every day | TOPICAL | 3 refills | 0 days | PRN
Start: 2023-03-29 — End: 2024-03-28

## 2023-03-29 MED ORDER — DEXCOM G6 SENSOR DEVICE
0 refills | 0 days
Start: 2023-03-29 — End: ?

## 2023-03-29 MED ORDER — DEXCOM G6 RECEIVER
Freq: Every day | 0 refills | 0 days
Start: 2023-03-29 — End: ?

## 2023-03-29 MED ORDER — OXYCODONE 10 MG TABLET
ORAL_TABLET | ORAL | 0 refills | 3 days | PRN
Start: 2023-03-29 — End: ?

## 2023-03-29 MED ORDER — PEN NEEDLE, DIABETIC 32 GAUGE X 5/32" (4 MM)
0 refills | 0.00000 days
Start: 2023-03-29 — End: ?

## 2023-03-29 NOTE — Unmapped (Signed)
No longer under my care

## 2023-03-29 NOTE — Unmapped (Signed)
Patient is requesting the following refill  Requested Prescriptions     Pending Prescriptions Disp Refills    blood-glucose sensor (DEXCOM G6 SENSOR) Devi 3 each 0     Sig: Use as directed to monitor blood glucose    pen needle, diabetic (TRUEPLUS PEN NEEDLE) 32 gauge x 5/32 (4 mm) Ndle 100 each 0     Sig: Use with insulin up to 4 times a day as needed.       Recent Visits  Date Type Provider Dept   08/04/22 Office Visit Mel Almond, MD [facility] Internal Medicine [facility]   08/01/22 Office Visit Leda Min, [place] [facility] Internal Medicine [facility]   06/30/22 Office Visit Rogue Jury, MD [facility] Internal Medicine [facility]   06/09/22 Office Visit Mel Almond, MD [facility] Internal Medicine [facility]   06/06/22 Office Visit Marlyne Beards, CPP [facility] Internal Medicine [facility]   05/30/22 Office Visit Marlyne Beards, CPP [facility] Internal Medicine [facility]   05/22/22 Office Visit Marlyne Beards, CPP [facility]   04/28/22 Office Visit Mel Almond, MD [place] Internal Medicine [facility]   Showing recent visits within past 365 days and meeting all other requirements  Future Appointments  No visits were found meeting these conditions.  Showing future appointments within next 365 days and meeting all other requirements       Labs: Not applicable this refill

## 2023-03-30 MED ORDER — DEXCOM G6 SENSOR DEVICE
0 refills | 0 days | Status: CP
Start: 2023-03-30 — End: ?

## 2023-03-30 MED ORDER — PEN NEEDLE, DIABETIC 32 GAUGE X 5/32" (4 MM)
0 refills | 0 days | Status: CP
Start: 2023-03-30 — End: ?

## 2023-04-03 MED ORDER — DEXCOM G6 RECEIVER
Freq: Every day | 1 refills | 0 days | Status: CP
Start: 2023-04-03 — End: ?

## 2023-04-04 MED ORDER — LIDOCAINE-PRILOCAINE 2.5 %-2.5 % TOPICAL CREAM
Freq: Every day | TOPICAL | 3 refills | 0 days | Status: CP | PRN
Start: 2023-04-04 — End: 2024-04-03
  Filled 2023-04-10: qty 30, 20d supply, fill #0

## 2023-04-05 NOTE — Unmapped (Signed)
VIR pre procedure prep call completed. Reviewed to arrive 1 hour prior to appointment time- 1400. Informed of no show/late cancellation policy. 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 Pt verbalized understanding. All questions answered.

## 2023-04-05 NOTE — Unmapped (Signed)
[facility] Delivery Pharm[facility] Refill Coordination Note    Specialty Medication(s) to be Shipped:   Transplant: mycophenolate mofetil 250 mg and Prograf 0.5mg     Other medication(s) to be shipped:  pen needle,dexcom sensor,transmitter,receiver,lidocaine,amlodipine,carvedilol     Richard [name], DOB: 24-Feb-1966  Phone: [phone] (home)       All above HIPAA information was verified with patient.     Was a Nurse, learning disability used for this call? No    Completed refill call assessment today to schedule patient's medication shipment from the [facility]  [phone]).  All relevant notes have been reviewed.     Specialty medication(s) and dose(s) confirmed: Regimen is correct and unchanged.   Changes to medications: [name] reports no changes at this time.  Changes to insurance: No  New side effects reported not previously addressed with a pharmacist or physician: None reported  Questions for the pharmacist: No    Confirmed patient received a Conservation officer, historic buildings and a Surveyor, mining with first shipment. The patient will receive a drug information handout for each medication shipped and additional FDA Medication Guides as required.       DISEASE/MEDICATION-SPECIFIC INFORMATION        N/A    SPECIALTY MEDICATION ADHERENCE     Medication Adherence    Patient reported X missed doses in the last month: 0  Specialty Medication: mycophenolate 250 mg capsule (CELLCEPT)  Patient is on additional specialty medications: Yes  Additional Specialty Medications: PROGRAF 0.5 mg capsule (tacrolimus)  Patient Reported Additional Medication X Missed Doses in the Last Month: 0  Patient is on more than two specialty medications: No  Adherence tools used: patient uses a pill box to manage medications              Were doses missed due to medication being on hold? No      mycophenolate 250 mg capsule (CELLCEPT): 7 days of medicine on hand     PROGRAF 0.5 mg capsule (tacrolimus): 7 days of medicine on hand         REFERRAL TO PHARMACIST     Referral to the pharmacist: Not needed      [facility]     Shipping address confirmed in Epic.       Delivery Scheduled: Yes, Expected medication delivery date: 04/11/2023.     Medication will be delivered via UPS to the temporary address in Epic [facility].    [name]  Specialty Technician

## 2023-04-10 ENCOUNTER — Ambulatory Visit: Admit: 2023-04-10 | Discharge: 2023-04-11 | Payer: PRIVATE HEALTH INSURANCE

## 2023-04-10 DIAGNOSIS — R059 Cough, unspecified type: Principal | ICD-10-CM

## 2023-04-10 MED ORDER — SEVELAMER HCL 800 MG TABLET
ORAL_TABLET | Freq: Three times a day (TID) | ORAL | 3 refills | 90 days | Status: CP
Start: 2023-04-10 — End: 2024-04-09
  Filled 2023-05-18: qty 540, 90d supply, fill #0

## 2023-04-10 MED ADMIN — midazolam (VERSED) injection: INTRAVENOUS | @ 21:00:00 | Stop: 2023-04-10

## 2023-04-10 MED ADMIN — fentaNYL (PF) (SUBLIMAZE) injection: INTRAVENOUS | @ 21:00:00 | Stop: 2023-04-10

## 2023-04-10 MED FILL — TRUEPLUS PEN NEEDLE 32 GAUGE X 5/32" (4 MM): 25 days supply | Qty: 100 | Fill #0

## 2023-04-10 MED FILL — DEXCOM G6 SENSOR DEVICE: 90 days supply | Qty: 9 | Fill #0

## 2023-04-10 MED FILL — MYCOPHENOLATE MOFETIL 250 MG CAPSULE: ORAL | 30 days supply | Qty: 60 | Fill #1

## 2023-04-10 MED FILL — PROGRAF 0.5 MG CAPSULE: ORAL | 30 days supply | Qty: 120 | Fill #1

## 2023-04-10 MED FILL — DEXCOM G6 RECEIVER: 30 days supply | Qty: 1 | Fill #0

## 2023-04-10 MED FILL — DEXCOM G6 TRANSMITTER DEVICE: 90 days supply | Qty: 1 | Fill #0

## 2023-04-10 MED FILL — CARVEDILOL 6.25 MG TABLET: ORAL | 90 days supply | Qty: 180 | Fill #0

## 2023-04-10 NOTE — Unmapped (Signed)
Dressing clean, dry, and intact.  Discharge instructions reviewed and given to pt and family.  Understanding verbalized.  PIV removed with tip intact.  Home supplies provided.  Pt ambulatory without assistance.  Discharged from PRU in stable condition.

## 2023-04-11 NOTE — Unmapped (Signed)
[facility] INTERVENTIONAL NEPHROLOGY- Pre Procedure H/P  Patient name: ARI ROSENDALE  CSN: [id number]  MRN: [id number]  Date of Procedure: 04/10/2023    Assessment/Plan:    Mr. Diles is a 57 y.o. male who will undergo left internal jugular vein tunneled dialysis catheter removal under moderate sedation in interventional nephrology at [facility] endovascular center.      Consent obtained in the Pre Op holding area by Dr. Tiffany Kocher.  Risks, benefits, and alternatives including but not limited to risks like bleeding, blood clots, infection, embolism, thromboembolism, pain, vascular injury were discussed with patient/patient's representative. All questions were answered to patient/patient's representative satisfaction.  Patient/Patient's representative consents and would like to proceed with the procedure.   --The patient will accept blood products in an emergent situation.  --The patient does not have a Do Not Resuscitate order in effect.      HPI: Mr. Claycamp is a 57 y.o. male male with ESRD on intermittent hemodialysis via a right brachiobasilic AV fistula comes in for a left internal jugular vein tunneled hemodialysis catheter removal under moderate sedation.  Patient notes that he has severe anxiety and required the tunneled dialysis catheter placement under general anesthesia.  Patient refuses to have the catheter removed with just lidocaine injection for anesthesia due to severe anxiety and requests for the catheter to be removed under moderate sedation.    Past Medical History:   Diagnosis Date    COVID-19 07/18/2019    Diabetes mellitus (CMS-HCC)     Family history of malignant neoplasm of prostate 06/23/2015    Hepatic cirrhosis (CMS-HCC) 06/23/2015    Overview:  Secondary to NASH; followed by Dr. Yevonne Pax, GI.  Last Assessment & Plan:  Relevant Hx: Course: Daily Update: Today's Plan:     Hypertension     Hypogonadism in male 02/12/2014    Liver cirrhosis secondary to NASH (nonalcoholic steatohepatitis) (CMS-HCC)     s/p liver transplant 2013       Past Surgical History:   Procedure Laterality Date    liver transpant      LIVER TRANSPLANTATION  2013    PR AV FIST REVISE GRFT,W THROMBECTOMY Right 01/15/2023    Procedure: REVISION, ARTERIOVENOUS FISTULA W/ THROMBECTOMY, AUTOGENOUS OR NONAUTOGENOUS DIALYSIS GRAFT (SEP. PROC), LOWER EXTREMITY;  Surgeon: Dyann Ruddle, MD;  Location[facility];  Service: General Surgery    PR COLONOSCOPY W/BIOPSY SINGLE/MULTIPLE N/A 08/13/2018    Procedure: COLONOSCOPY, FLEXIBLE, PROXIMAL TO SPLENIC FLEXURE; WITH BIOPSY, SINGLE OR MULTIPLE;  Surgeon: Neysa Hotter, MD;  [facility];  Service: Gastroenterology    PR COLSC FLX W/RMVL OF TUMOR POLYP LESION SNARE TQ N/A 08/13/2018    Procedure: COLONOSCOPY FLEX; W/REMOV TUMOR/LES BY SNARE;  Surgeon: Neysa Hotter, MD;  Location: GI [facility]URES [facility];  Service: Gastroenterology    PR CREAT AV FISTULA,NON-AUTOGENOUS GRAFT Right 11/29/2022    Procedure: CREATE AV FISTULA (SEPARATE PROC); NONAUTOGENOUS GRAFT (EG, BIOLOGICAL COLLAGEN, THERMOPLASTIC GRAFT), UPPER EXTREMITY;  Surgeon: Dyann Ruddle, MD;  Location: [facility] OR [facility];  Service: General Surgery    PR UPPER GI ENDOSCOPY,BIOPSY N/A 07/13/2015    Procedure: UGI ENDOSCOPY; WITH BIOPSY, SINGLE OR MULTIPLE;  Surgeon: Joaquin Music, MD;  [facility];  Service: Gastroenterology    PR UPPER GI ENDOSCOPY,BIOPSY N/A 02/13/2023    Procedure: UGI ENDOSCOPY; WITH BIOPSY, SINGLE OR MULTIPLE;  Surgeon: Vonda Antigua, MD;  Location: GI [facility];  Service: Gastroenterology  Allergies:   No Known Allergies    Medications:  No relevant medications, please see full medication list in Epic.    ASA Grade: ASA 3 - Patient with moderate systemic disease with functional limitations    PE:    Vitals:    04/10/23 1652   BP: 146/84   Pulse:    Resp:    Temp:    SpO2: 98%     General: male in NAD.  Airway assessment: Class 3 - Can visualize soft palate  Lungs: Respirations nonlabored  Left internal jugular vein tunneled hemodialysis catheter was noted.

## 2023-04-11 NOTE — Unmapped (Signed)
[place] INTERVENTIONAL NEPHROLOGY - Operative Note     Post-Procedure Note    Procedure Name: Tunneled hemodialysis catheter removal from the left internal jugular vein.    Pre-Op Diagnosis: ESRD    Post-Op Diagnosis: Same as pre-operative diagnosis    Attending Physician:  Gaylyn Lambert, MD    Time out: Prior to the procedure, a time out was performed with all team members present. During the time out, the patient, procedure and procedure site when applicable were verbally verified by the team members and Gaylyn Lambert, MD.    Description of procedure: Successful removal of left internal jugular vein tunneled hemodialysis catheter using blunt dissection and traction.    Sedation:Moderate sedation    Estimated Blood Loss: approximately less than 5 mL  Complications: None    See detailed procedure note with images in PACS [facility]).    The patient tolerated the procedure well without incident or complication and left the room in stable condition.    Gaylyn Lambert, MD  04/10/2023 5:27 PM

## 2023-04-12 NOTE — Unmapped (Signed)
Pre called for clinic visit . Started triage but he could only complete up to the history .Stated caught him at a bad time, will finish tomorrow. Needs to complete Medications ,Allergies, and reason for visit and vitals.

## 2023-05-04 NOTE — Unmapped (Signed)
Called and spoke to patient and confirmed scheduled Kidney Txp Eval appointments.  Discussed NPO, letter sent via mail and mychart.

## 2023-05-15 DIAGNOSIS — Z794 Long term (current) use of insulin: Principal | ICD-10-CM

## 2023-05-15 DIAGNOSIS — E1165 Type 2 diabetes mellitus with hyperglycemia: Principal | ICD-10-CM

## 2023-05-15 MED ORDER — LEVEMIR FLEXPEN 100 UNIT/ML (3 ML) SOLUTION SUBCUTANEOUS INSULIN PEN
Freq: Every evening | SUBCUTANEOUS | 0 refills | 83 days
Start: 2023-05-15 — End: ?

## 2023-05-15 MED ORDER — INSULIN ASPART (U-100) 100 UNIT/ML (3 ML) SUBCUTANEOUS PEN
Freq: Three times a day (TID) | SUBCUTANEOUS | 0 refills | 57 days
Start: 2023-05-15 — End: ?

## 2023-05-15 MED ORDER — PEN NEEDLE, DIABETIC 32 GAUGE X 5/32" (4 MM)
0 refills | 0 days
Start: 2023-05-15 — End: ?

## 2023-05-15 NOTE — Unmapped (Signed)
[facility] has made a second and final attempt to reach this patient to refill the following medication:mycophenolate 250 mg capsule (CELLCEPT),PROGRAF 0.5 mg capsule (tacrolimus).      We have left voicemails on the following phone numbers: [phone], have sent a MyChart message, have sent a text message to the following phone numbers: [phone], and have sent a Mychart questionnaire..    Dates contacted: 11/15,11/19  Last scheduled delivery: 04/10/2023    The patient may be at risk of non-compliance with this medication. The patient should call the [facility] at [phone]  Option 4, then Option 4: Infectious Disease, Transplant to refill medication.    Jorje Guild Specialty and Home Delivery Oncologist

## 2023-05-15 NOTE — Unmapped (Signed)
[facility] Specialty and Home Delivery Pharmacy Refill Coordination Note    Specialty Medication(s) to be Shipped:   Transplant: mycophenolate mofetil 250mg  and Prograf 0.5mg     Other medication(s) to be shipped:  Amlodipine, Novolog, Levemir, Pen needle, Sevelamer     [name], DOB: [birth year]  Phone: [phone] (home)       All above HIPAA information was verified with patient.     Was a Nurse, learning disability used for this call? No    Completed refill call assessment today to schedule patient's medication shipment from the [facility]  [phone]).  All relevant notes have been reviewed.     Specialty medication(s) and dose(s) confirmed: Regimen is correct and unchanged.   Changes to medications: Elmon reports no changes at this time.  Changes to insurance: No  New side effects reported not previously addressed with a pharmacist or physician: None reported  Questions for the pharmacist: No    Confirmed patient received a Conservation officer, historic buildings and a Surveyor, mining with first shipment. The patient will receive a drug information handout for each medication shipped and additional FDA Medication Guides as required.       DISEASE/MEDICATION-SPECIFIC INFORMATION        N/A    SPECIALTY MEDICATION ADHERENCE     Medication Adherence    Patient reported X missed doses in the last month: 0  Specialty Medication: Mycophenolate  Patient is on additional specialty medications: Yes  Additional Specialty Medications: Prograf 0.5mg   Patient Reported Additional Medication X Missed Doses in the Last Month: 0  Informant: patient  Adherence tools used: patient uses a pill box to manage medications          Were doses missed due to medication being on hold? No    Mycophenolate 250 mg: 7 days of medicine on hand   Prograf 0.5 mg: 7 days of medicine on hand       REFERRAL TO PHARMACIST     Referral to the pharmacist: Not needed      [facility]     Shipping address confirmed in Epic.       Delivery Scheduled: Yes, Expected medication delivery date: 05/18/23.  However, Rx request for refills was sent to the provider as there are none remaining.     Medication will be delivered via UPS to the temporary address in Epic WAM.    Jaquitta Dupriest Vangie Bicker, PharmD   [facility] Special[facility] and Home Delivery Pharmacy  Specialty Pharmacist

## 2023-05-17 MED ORDER — LEVEMIR FLEXPEN 100 UNIT/ML (3 ML) SOLUTION SUBCUTANEOUS INSULIN PEN
Freq: Every evening | SUBCUTANEOUS | 0 refills | 83 days | Status: CP
Start: 2023-05-17 — End: ?
  Filled 2023-05-17: qty 60, 57d supply, fill #0
  Filled 2023-05-17: qty 15, 83d supply, fill #0

## 2023-05-17 MED ORDER — INSULIN ASPART (U-100) 100 UNIT/ML (3 ML) SUBCUTANEOUS PEN
Freq: Three times a day (TID) | SUBCUTANEOUS | 0 refills | 57 days | Status: CP
Start: 2023-05-17 — End: ?

## 2023-05-17 MED ORDER — PEN NEEDLE, DIABETIC 32 GAUGE X 5/32" (4 MM)
0 refills | 0 days | Status: CP
Start: 2023-05-17 — End: ?

## 2023-05-17 MED FILL — TRUEPLUS PEN NEEDLE 32 GAUGE X 5/32" (4 MM): 25 days supply | Qty: 100 | Fill #0

## 2023-05-17 MED FILL — AMLODIPINE 10 MG TABLET: ORAL | 90 days supply | Qty: 90 | Fill #1

## 2023-05-17 MED FILL — MYCOPHENOLATE MOFETIL 250 MG CAPSULE: ORAL | 30 days supply | Qty: 60 | Fill #2

## 2023-05-17 MED FILL — PROGRAF 0.5 MG CAPSULE: ORAL | 30 days supply | Qty: 120 | Fill #2

## 2023-06-01 ENCOUNTER — Ambulatory Visit: Admit: 2023-06-01 | Discharge: 2023-06-03 | Payer: BLUE CROSS/BLUE SHIELD

## 2023-06-01 MED ADMIN — Tc-99m Sestamibi (Cardiolite): 44.7 | INTRAVENOUS | @ 18:00:00 | Stop: 2023-06-01

## 2023-06-01 MED ADMIN — regadenoson (LEXISCAN) injection: .4 mg | INTRAVENOUS | @ 17:00:00 | Stop: 2023-06-01

## 2023-06-01 MED ADMIN — Tc-99m Sestamibi (Cardiolite): 14.7 | INTRAVENOUS | @ 17:00:00 | Stop: 2023-06-01

## 2023-06-01 MED ADMIN — regadenoson (LEXISCAN) injection: INTRAVENOUS | @ 17:00:00 | Stop: 2023-06-01

## 2023-06-13 ENCOUNTER — Ambulatory Visit: Admit: 2023-06-13 | Payer: BLUE CROSS/BLUE SHIELD

## 2023-06-13 NOTE — Unmapped (Unsigned)
Internal Medicine Clinic Visit    Reason for visit: Stomach pain    A/P:    {TIP - HCC- RAFF Pilot- Clinical Documentation Specialist Recommendations-  No specialty comments available.   This text will self delete upon signing note:75688}     No diagnosis found.    Assessment & Plan          No follow-ups on file.    Staffed with Dr. ***, {seen/discussed:75519}    __________________________________________________________    HPI:    ***    PMH    T2DM- uses insulin (Levemir 18 units nightly, aspart 35 units 3 times daily mealtime),  ESRD on dialysis-dialysis with [facility] once weekly?***,  Sevelamer 1600 mg 3 times daily,  Liver transplant-2013, 2/2 NASH, on mycophenolate 250 mg twice daily, tacrolimus 1 mg twice daily  HFpEF-    __________________________________________________________        Medications:  Reviewed in EPIC  __________________________________________________________    Physical Exam:   Vital Signs:  There were no vitals filed for this visit.       PTHomeBP    The patient???s Average Home Blood Pressure during the last two weeks is :   /   based on  readings            Gen: Well appearing, NAD  CV: RRR, no murmurs  Pulm: CTA bilaterally, no crackles or wheezes  Abd: Soft, NTND, normal BS.   Ext: No edema  ***    PHQ-9 Score:     GAD-7 Score:       Medication adherence and barriers to the treatment plan have been addressed. Opportunities to optimize healthy behaviors have been discussed. Patient / caregiver voiced understanding.

## 2023-06-29 NOTE — Unmapped (Signed)
Called patient back and needed to reschedule Kidney Txp appointments for today due to CG has covid.  Rescheduled to 2/25 and sent new letter via mail and mychart.

## 2023-07-23 DIAGNOSIS — Z794 Long term (current) use of insulin: Principal | ICD-10-CM

## 2023-07-23 DIAGNOSIS — E1165 Type 2 diabetes mellitus with hyperglycemia: Principal | ICD-10-CM

## 2023-07-23 MED ORDER — PEN NEEDLE, DIABETIC 32 GAUGE X 5/32" (4 MM)
0 refills | 0.00 days
Start: 2023-07-23 — End: ?

## 2023-07-23 MED ORDER — INSULIN ASPART (U-100) 100 UNIT/ML (3 ML) SUBCUTANEOUS PEN
Freq: Three times a day (TID) | SUBCUTANEOUS | 0 refills | 57.00 days | Status: CP
Start: 2023-07-23 — End: ?
  Filled 2023-07-25: qty 60, 57d supply, fill #0

## 2023-07-23 MED ORDER — LEVEMIR FLEXPEN 100 UNIT/ML (3 ML) SOLUTION SUBCUTANEOUS INSULIN PEN
Freq: Every evening | SUBCUTANEOUS | 0 refills | 83.00 days | Status: CP
Start: 2023-07-23 — End: ?

## 2023-07-23 NOTE — Unmapped (Signed)
[facility] Specialty and Home Delivery Pharmacy Refill Coordination Note    Specialty Medication(s) to be Shipped:   Transplant: mycophenolate mofetil 250mg  and Prograf 0.5mg     Other medication(s) to be shipped: amlodipine 10 MG tablet (NORVASC), carvedilol 6.25 MG tablet (COREG), DEXCOM G6 SENSOR Devi (blood-glucose sensor), DEXCOM G6 TRANSMITTER Devi (blood-glucose transmitter), NovoLOG Flexpen U-100 Insulin 100 unit/mL (3 mL) injection pen (insulin aspart), LEVEMIR FLEXPEN 100 unit/mL (3 mL) injection pen (insulin detemir U-100), TRUEPLUS PEN NEEDLE 32 gauge x 5/32 (4 mm) Ndle (pen needle, diabetic)     Richard [name], DOB: 11-04-1965  Phone: [phone] (home)       All above HIPAA information was verified with patient.     Was a Nurse, learning disability used for this call? No    Completed refill call assessment today to schedule patient's medication shipment from the [facility]  [phone]).  All relevant notes have been reviewed.     Specialty medication(s) and dose(s) confirmed: Regimen is correct and unchanged.   Changes to medications: Daymian reports no changes at this time.  Changes to insurance: No  New side effects reported not previously addressed with a pharmacist or physician: None reported  Questions for the pharmacist: No    Confirmed patient received a Conservation officer, historic buildings and a Surveyor, mining with first shipment. The patient will receive a drug information handout for each medication shipped and additional FDA Medication Guides as required.       DISEASE/MEDICATION-SPECIFIC INFORMATION        N/A    SPECIALTY MEDICATION ADHERENCE     Medication Adherence    Patient reported X missed doses in the last month: 0  Specialty Medication: mycophenolate 250 mg capsule (CELLCEPT)  Patient is on additional specialty medications: Yes  Additional Specialty Medications: PROGRAF 0.5 mg capsule (tacrolimus)  Patient Reported Additional Medication X Missed Doses in the Last Month: 0  Patient is on more than two specialty medications: No  Any gaps in refill history greater than 2 weeks in the last 3 months: no  Demonstrates understanding of importance of adherence: yes  Adherence tools used: patient uses a pill box to manage medications              Were doses missed due to medication being on hold? No    mycophenolate 250  mg: 7 days of medicine on hand   PROGRAF 0.5  mg: 7 days of medicine on hand       REFERRAL TO PHARMACIST     Referral to the pharmacist: Not needed      [facility]     Shipping address confirmed in Epic.       Delivery Scheduled: Yes, Expected medication delivery date: 07/26/23.     Medication will be delivered via UPS to the temporary address in Epic WAM.    Richard [name]   [facility] Specialty and Home Delivery Pharmacy  Specialty Technician

## 2023-07-24 MED ORDER — PEN NEEDLE, DIABETIC 32 GAUGE X 5/32" (4 MM)
0 refills | 0.00 days | Status: CP
Start: 2023-07-24 — End: ?

## 2023-07-24 NOTE — Unmapped (Signed)
Unable to complete refill request, please advise as the patient is outside the 15 months window.  Please advise on how to proceed.  Patient is requesting the following refill  Requested Prescriptions     Pending Prescriptions Disp Refills    pen needle, diabetic (TRUEPLUS PEN NEEDLE) 32 gauge x 5/32 (4 mm) Ndle 100 each 0     Sig: Use with insulin up to 4 times a day as needed.       Recent Visits  Date Type Provider Dept   08/04/22 Office Visit Mel Almond, [place] [facility]   08/01/22 Office Visit Leda Min, [place] [facility]   Showing recent visits within past 365 days and meeting all other requirements  Future Appointments  No visits were found meeting these conditions.  Showing future appointments within next 365 days and meeting all other requirements       Labs: Not applicable this refill

## 2023-07-25 MED FILL — LEVEMIR FLEXPEN 100 UNIT/ML (3 ML) SOLUTION SUBCUTANEOUS INSULIN PEN: SUBCUTANEOUS | 83 days supply | Qty: 15 | Fill #0

## 2023-07-25 MED FILL — MYCOPHENOLATE MOFETIL 250 MG CAPSULE: ORAL | 30 days supply | Qty: 60 | Fill #3

## 2023-07-25 MED FILL — AMLODIPINE 10 MG TABLET: ORAL | 90 days supply | Qty: 90 | Fill #2

## 2023-07-25 MED FILL — PROGRAF 0.5 MG CAPSULE: ORAL | 30 days supply | Qty: 120 | Fill #3

## 2023-07-25 MED FILL — CARVEDILOL 6.25 MG TABLET: ORAL | 90 days supply | Qty: 180 | Fill #1

## 2023-07-25 MED FILL — TRUEPLUS PEN NEEDLE 32 GAUGE X 5/32" (4 MM): 25 days supply | Qty: 100 | Fill #0

## 2023-07-25 MED FILL — DEXCOM G6 SENSOR DEVICE: 90 days supply | Qty: 9 | Fill #1

## 2023-07-25 MED FILL — DEXCOM G6 TRANSMITTER DEVICE: 90 days supply | Qty: 1 | Fill #1

## 2023-07-26 NOTE — Unmapped (Signed)
Complex Case Management  SUMMARY NOTE    Care Management Assistant spoke with patient and verified correct patient using two identifiers today to introduce the Complex Case Management program.     Discussed the following:  Program Services    Program status: Declined    [facility] Management Assistant  [facility]  [address]  [place], [place] [address]  She/Her/Hers  P: [phone] F: [phone]  [email or web address]

## 2023-08-21 ENCOUNTER — Ambulatory Visit
Admit: 2023-08-21 | Payer: BLUE CROSS/BLUE SHIELD | Attending: Student in an Organized Health Care Education/Training Program | Primary: Student in an Organized Health Care Education/Training Program

## 2023-08-21 ENCOUNTER — Ambulatory Visit: Admit: 2023-08-21 | Payer: BLUE CROSS/BLUE SHIELD

## 2023-08-21 NOTE — Unmapped (Unsigned)
 Kidney transplant surgical evaluation       HPI: Richard [name] is a 58 y.o. patient, referred for surgical kidney transplant evaluation. Richard [name] is s/p OLT for NASH in 2013. 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, likely compounded by poorly controlled DM & HTN.The pt follows with Richard [name] for general nephrology and Richard [name] in the the pre-transplant high BMI clinic  Richard [name]  has a past surgical history that includes liver transpant; Liver transplantation (2013); pr upper gi endoscopy,biopsy (N/A, 07/13/2015); pr colsc flx w/rmvl of tumor polyp lesion snare tq (N/A, 08/13/2018); pr colonoscopy w/biopsy single/multiple (N/A, 08/13/2018); pr creat av fistula,non-autogenous graft (Right, 11/29/2022); pr av fist revise grft,w thrombectomy (Right, 01/15/2023); and pr upper gi endoscopy,biopsy (N/A, 02/13/2023).Marland Kitchen Richard [name] is accompanided by his/her*** who will be one of his/her*** caregivers***.      The patient dialyses through/ has no dialysis access***. Richard [name] has been dialysis dependent for  *** years.   The patient makes no urine/ normal urine output***.    Cause of kidney disease:***  On dialysis?: Yes***  Prior transplants: Yes, OLT (2013) for NASH  Prior blood product transfusions: Yes  Kidney stones: No***  Frequent UTI: No***  History of heart disease: None reported    Past Medical History:   Diagnosis Date    COVID-19 07/18/2019    Diabetes mellitus (CMS-HCC)     Family history of malignant neoplasm of prostate 06/23/2015    Hepatic cirrhosis (CMS-HCC) 06/23/2015    Overview:  Secondary to NASH; followed by Dr. Yevonne Pax, GI.  Last Assessment & Plan:  Relevant Hx: Course: Daily Update: Today's Plan:     Hypertension     Hypogonadism in male 02/12/2014    Liver cirrhosis secondary to NASH (nonalcoholic steatohepatitis) (CMS-HCC)     s/p liver transplant 2013       Past Surgical History:   Procedure Laterality Date    liver transpant      LIVER TRANSPLANTATION  2013    PR AV FIST REVISE GRFT,W THROMBECTOMY Right 01/15/2023    Procedure: REVISION, ARTERIOVENOUS FISTULA W/ THROMBECTOMY, AUTOGENOUS OR NONAUTOGENOUS DIALYSIS GRAFT (SEP. PROC), LOWER EXTREMITY;  Surgeon: Dyann Ruddle, MD;  [facility];  Service: General Surgery    PR COLONOSCOPY W/BIOPSY SINGLE/MULTIPLE N/A 08/13/2018    Procedure: COLONOSCOPY, FLEXIBLE, PROXIMAL TO SPLENIC FLEXURE; WITH BIOPSY, SINGLE OR MULTIPLE;  Surgeon: Neysa Hotter, MD;  [facility];  Service: Gastroenterology    PR COLSC FLX W/RMVL OF TUMOR POLYP LESION SNARE TQ N/A 08/13/2018    Procedure: COLONOSCOPY FLEX; W/REMOV TUMOR/LES BY SNARE;  Surgeon: Neysa Hotter, MD;  Location: [facility];  Service: Gastroenterology    PR CREAT AV FISTULA,NON-AUTOGENOUS GRAFT Right 11/29/2022    Procedure: CREATE AV FISTULA (SEPARATE PROC); NONAUTOGENOUS GRAFT (EG, BIOLOGICAL COLLAGEN, THERMOPLASTIC GRAFT), UPPER EXTREMITY;  Surgeon: Dyann Ruddle, MD;  Location: [facility];  Service: General Surgery    PR UPPER GI ENDOSCOPY,BIOPSY N/A 07/13/2015    Procedure: UGI ENDOSCOPY; WITH BIOPSY, SINGLE OR MULTIPLE;  Surgeon: Joaquin Music, MD;  [facility]: [facility];  Service: Gastroenterology    PR UPPER GI ENDOSCOPY,BIOPSY N/A 02/13/2023    Procedure: UGI ENDOSCOPY; WITH BIOPSY, SINGLE OR MULTIPLE;  Surgeon: Vonda Antigua, MD;  Location: [facility];  Service: Gastroenterology       Family History   Problem Relation Age of Onset  Diabetes Father     Diabetes Paternal Grandfather     Liver disease Maternal Uncle     Melanoma Neg Hx     Basal cell carcinoma Neg Hx     Squamous cell carcinoma Neg Hx     Kidney disease Neg Hx        Social History     Socioeconomic History    Marital status: Married     Spouse name: Tulia    Number of children: 2   Tobacco Use    Smoking status: Former     Passive exposure: Past    Smokeless tobacco: Never   Vaping Use    Vaping status: Never Used   Substance and Sexual Activity    Alcohol use: No    Drug use: No    Sexual activity: Yes     Partners: Female   Other Topics Concern    Do you use sunscreen? No    Tanning bed use? No    Are you easily burned? No    Excessive sun exposure? No    Blistering sunburns? No   Social History Narrative    From [place]    He was in the [employer].    Now lives in [place]    He works in Engineer, petroleum        Married    1 child (age 83.5)     Social Drivers of Health     Financial Resource Strain: Low Risk  (11/17/2022)    Overall Financial Resource Strain (CARDIA)     Difficulty of Paying Living Expenses: Not very hard   Food Insecurity: No Food Insecurity (11/17/2022)    Hunger Vital Sign     Worried About Running Out of Food in the Last Year: Never true     Ran Out of Food in the Last Year: Never true   Transportation Needs: No Transportation Needs (11/17/2022)    PRAPARE - Therapist, art (Medical): No     Lack of Transportation (Non-Medical): No   Physical Activity: Insufficiently Active (07/29/2020)    Received from [facility] visits prior to 08/26/2022., [facility] visits prior to 08/26/2022.    Exercise Vital Sign     Days of Exercise per Week: 2 days     Minutes of Exercise per Session: 20 min   Stress: No Stress Concern Present (07/29/2020)    Received from [facility] visits prior to 08/26/2022., [facility] visits prior to 08/26/2022.    [employer] of Occupational Health - Occupational Stress Questionnaire     Feeling of Stress : Not at all    Received from [employer], [facility]    Social Network           O NEG      There are currently no*** potential living donors.       Current Outpatient Medications:     acetaminophen (TYLENOL) 500 MG tablet, Take 650 mg by mouth., Disp: , Rfl:     amlodipine (NORVASC) 10 MG tablet, Take 1 tablet (10 mg total) by mouth daily., Disp: 90 tablet, Rfl: 3    blood-glucose meter,continuous (DEXCOM G6 RECEIVER) Misc, 1 each by Miscellaneous route in the morning. Dispense 1 receiver annually.  Sent to [facility]., Disp: 1 each, Rfl: 1    blood-glucose sensor (DEXCOM G6 SENSOR) Devi, Use to check blood glucose and change every  10 days., Disp: 9 each, Rfl: 3    blood-glucose transmitter (DEXCOM G6 TRANSMITTER) Devi, Use as directed to monitor blood glucose, Disp: 1 each, Rfl: 3    blood-glucose transmitter (DEXCOM G6 TRANSMITTER) Devi, 1 each by Miscellaneous route Every three (3) months. [facility] pharmacy, Disp: , Rfl:     carvedilol (COREG) 6.25 MG tablet, Take 1 tablet (6.25 mg total) by mouth two (2) times a day., Disp: 180 tablet, Rfl: 3    CHOLECALCIFEROL, VITAMIN D3, (VITAMIN D3 ORAL), Take 2,000 Units by mouth daily., Disp: , Rfl:     insulin aspart (NOVOLOG FLEXPEN U-100 INSULIN) 100 unit/mL (3 mL) injection pen, Inject 0.35 mL (35 Units total) under the skin Three (3) times a day before meals., Disp: 60 mL, Rfl: 0    insulin detemir U-100 (LEVEMIR FLEXPEN) 100 unit/mL (3 mL) injection pen, Inject 0.18 mL (18 Units total) under the skin nightly., Disp: 15 mL, Rfl: 0    lidocaine-prilocaine (EMLA) 2.5-2.5 % cream, Apply topically daily as needed., Disp: 30 g, Rfl: 3    magnesium oxide (MAG-OX) 400 mg (241.3 mg elemental magnesium) tablet, Take 1 tablet (400 mg total) by mouth daily., Disp: , Rfl:     multivitamin (TAB-A-VITE/THERAGRAN) per tablet, Take 1 tablet by mouth daily., Disp: , Rfl:     mycophenolate (CELLCEPT) 250 mg capsule, Take 1 capsule (250 mg total) by mouth two (2) times a day., Disp: 60 capsule, Rfl: 11    oxyCODONE (ROXICODONE) 10 mg immediate release tablet, Take 1 tablet (10 mg total) by mouth every four (4) hours as needed for pain., Disp: 15 tablet, Rfl: 0    pen needle, diabetic (TRUEPLUS PEN NEEDLE) 32 gauge x 5/32 (4 mm) Ndle, Use with insulin up to 4 times a day as needed., Disp: 100 each, Rfl: 0    pen needle, diabetic 32 gauge x 5/32 (4 mm) Ndle, Use with insulin up to 4 times/day as needed., Disp: 100 each, Rfl: 0    sevelamer (RENAGEL) 800 MG tablet, Take 2 tablets (1,600 mg total) by mouth Three (3) times a day with a meal., Disp: 540 tablet, Rfl: 3    sildenafil (VIAGRA) 50 MG tablet, Take 1 tablet (50 mg total) by mouth daily as needed for erectile dysfunction., Disp: , Rfl:     tacrolimus (PROGRAF) 0.5 MG capsule, Take 2 capsules (1 mg total) by mouth two (2) times a day., Disp: 360 capsule, Rfl: 3    No Known Allergies      ROS     Physical Exam      There were no vitals filed for this visit.***  There is no height or weight on file to calculate BMI.***    Lab Results   Component Value Date    WBC 5.2 01/18/2023    HGB 11.6 (L) 01/18/2023    HCT 35.2 (L) 01/18/2023    PLT 206 01/18/2023       Lab Results   Component Value Date    NA 136 02/09/2023    K 4.2 02/09/2023    CL 100 02/09/2023    CO2 24.0 02/09/2023    BUN 27 (H) 02/09/2023    CREATININE 8.63 (H) 02/09/2023    GLU 265 (H) 02/09/2023    CALCIUM 9.6 02/09/2023    MG 2.1 02/09/2023    PHOS 6.8 (H) 02/09/2023       Lab Results   Component Value Date    BILITOT 0.7 01/18/2023    BILIDIR 0.50 (  H) 12/04/2022    PROT 7.3 01/18/2023    ALBUMIN 3.1 (L) 01/18/2023    ALT 16 01/18/2023    AST 78 (H) 01/18/2023    ALKPHOS 354 (H) 01/18/2023    GGT 105 (H) 11/09/2022       Lab Results   Component Value Date    PT 11.1 11/17/2022    INR 0.99 11/17/2022    APTT 26.1 04/24/2022           CARDIAC WORKUP    ECHO***   1. The left ventricle is normal in size with moderately increased wall  thickness.    2. The left ventricular systolic function is normal, LVEF is visually  estimated at > 55%.    3. Technically difficult study.    4. The left atrium is mildly to moderately dilated in size.      NM MP SCAN***  - Low risk study  - No significant ischemia noted  - There is a moderate sized, mild in severity, fixed perfusion defect involving the mid anterior, apical anterior and apical segments. This is consistent with possible artifact (motion and misregistration of attenuation CT, attenuation) but, cannot rule out subtle scar.  - Left ventricular systolic function is normal. Post stress the ejection fraction is > 60%.  - Mild coronary calcifications are noted  - Incidentally noted on the attenuation CT scan are several small calcified nodules likely representing sequelae of previous granulomatous disease    LHC***    IMAGING    CT A/P***  No recent study available    US renal***  None available    -------------------------------------------------    Assessment/ recommendations     Richard [name]*** with ESRD and Past Medical History:  07/18/2019: COVID-19  No date: Diabetes mellitus (CMS-HCC)  06/23/2015: Family history of malignant neoplasm of prostate  06/23/2015: Hepatic cirrhosis (CMS-HCC)      Comment:  Overview:  Secondary to NASH; followed by Dr. Yevonne Pax,                GI.  Last Assessment & Plan:  Relevant Hx: Course: Daily                Update: Today's Plan:   No date: Hypertension  02/12/2014: Hypogonadism in male  No date: Liver cirrhosis secondary to NASH (nonalcoholic   steatohepatitis) (CMS-HCC)      Comment:  s/p liver transplant 2013 , referred for kidney transplant evaluation.  No surgical contraindications for transplant***  Given the cardiac risk, I would not recommend enlisting the pt inactive unless he is otherwise ready for transplant.***   The patient would make a reasonable/ poor/good/ high risk candidate***.      No recent imaging available. Transplantable on either side based on his last imaging. Repeat imaging.  PVD clinical: Yes/ No  (h/o stroke/ carotid artery stenosis/ amputations/ revasularization/ claudication or other PVD symptoms/ ischemic changes in lower extremities/ faint pulses)***  PVD imaging: No  Malignancy: Y/N***  Karnofsky:***        We discussed living donor and deceased donor renal transplantation. We discussed some of the potential complications associated with kidney transplantation, including delayed graft function, graft thrombosis requiring immediate nephrectomy, and ureteral stenosis. Possible disease recurrence. We discussed the need for lifelong immunosuppression and strict adherence to the immunosuppression regimen. The patient understands that immunosuppressive medications must be taken twice per day without fail or the patient could develop rejection, which would greatly limit the lifespan of the transplanted kidney. We discussed  potential immunosuppressive regimens including steroid-free immunosuppressive regimens. We discussed the potential complications associated with immunosuppression including diarrhea, abdominal pain, hypertension, increased hunger and weight gain, physical changes to the face (Cushingoid facies), decreased bone density, mood swings, insomnia, leukopenia, increased susceptibility to certain infections including BK Polyoma virus, CMV and EBV, diabetes mellitus, increased risk of certain malignancies including skin cancer and post-transplant lymphoproliferative disease for which we will monitor the patient, and allograft dysfunction that can develop despite a successful transplant surgery and adherence to the medication regimen. We discussed the need for intensive medical follow-up and frequent laboratory testing in the post-transplant period. The patient understands that currently the average lifespan of a kidney transplant is approximately 12-15 years and that when the kidney transplant function deteriorates the patient will require either dialysis or re-transplantation. I encouraged completion/ pursuing completion of Covid vaccination.  The patient appears to have a good understanding of these issues. The patient had all his/her*** concerns addressed to his apparent satisfaction. Thank you for this referral. I am looking forward to discussing over our patient' candidacy at the next renal conference meeting. In the meantime, please do not hesitate to contact me should you have any questions or concerns.    >60*** min were spent during this encounter for chart review, interpretation of lab results and/ or pertinent imaging studies, past medical records and imported studies, multidisciplinary consultation, medical decision making over the patient's transplant candidacy, and coordinating the care of this patient.     Eartha Inch MD MSc Gwenith Spitz  Associate Professor, Transplant & Hepatobiliary surgery

## 2023-09-04 DIAGNOSIS — Z794 Long term (current) use of insulin: Principal | ICD-10-CM

## 2023-09-04 DIAGNOSIS — E1159 Type 2 diabetes mellitus with other circulatory complications: Principal | ICD-10-CM

## 2023-09-04 MED ORDER — MOUNJARO 2.5 MG/0.5 ML SUBCUTANEOUS PEN INJECTOR
SUBCUTANEOUS | 2 refills | 28.00 days | Status: CP
Start: 2023-09-04 — End: 2023-12-03
  Filled 2023-09-17: qty 2, 28d supply, fill #0

## 2023-09-05 NOTE — Unmapped (Signed)
 I Called and scheduled patient for follow up appointment 10/01/2023 at 8:30. Pt confirmed.

## 2023-09-13 DIAGNOSIS — Z794 Long term (current) use of insulin: Principal | ICD-10-CM

## 2023-09-13 DIAGNOSIS — E1165 Type 2 diabetes mellitus with hyperglycemia: Principal | ICD-10-CM

## 2023-09-13 MED ORDER — INSULIN ASPART (U-100) 100 UNIT/ML (3 ML) SUBCUTANEOUS PEN
Freq: Three times a day (TID) | SUBCUTANEOUS | 0 refills | 57.00 days
Start: 2023-09-13 — End: ?

## 2023-09-13 NOTE — Unmapped (Signed)
 [facility] Refill Coordination Note    Specialty Medication(s) to be Shipped:   Transplant: mycophenolate mofetil 250 mg and Prograf 0.5mg  and Specialty Lite: Mounjaro    Other medication(s) to be shipped:  sevelamer and Novolog     Richard [name], DOB: 22-Apr-1966  Phone: [phone] (home)       All above HIPAA information was verified with patient.     Was a Nurse, learning disability used for this call? No    Completed refill call assessment today to schedule patient's medication shipment from the [facility]  [phone]).  All relevant notes have been reviewed.     Specialty medication(s) and dose(s) confirmed: Regimen is correct and unchanged.   Changes to medications: Damare reports no changes at this time.  Changes to insurance: No  New side effects reported not previously addressed with a pharmacist or physician: None reported  Questions for the pharmacist: No    Confirmed patient received a Conservation officer, historic buildings and a Surveyor, mining with first shipment. The patient will receive a drug information handout for each medication shipped and additional FDA Medication Guides as required.       DISEASE/MEDICATION-SPECIFIC INFORMATION        N/A    SPECIALTY MEDICATION ADHERENCE     Medication Adherence    Patient reported X missed doses in the last month: 0  Specialty Medication: mycophenolate 250 mg capsule (CELLCEPT)  Patient is on additional specialty medications: Yes  Additional Specialty Medications: tacrolimus: PROGRAF 0.5 mg capsule  Patient Reported Additional Medication X Missed Doses in the Last Month: 0  Patient is on more than two specialty medications: No  Adherence tools used: patient uses a pill box to manage medications              Were doses missed due to medication being on hold? No    mycophenolate 250 mg: 7 days of medicine on hand   Prograf 0.5 mg: 7 days of medicine on hand       REFERRAL TO PHARMACIST     Referral to the pharmacist: Not needed      [facility]     Shipping address confirmed in Epic.     Cost and Payment: Patient has a $0 copay, payment information is not required.    Delivery Scheduled: Yes, Expected medication delivery date: 09/18/2023.     Medication will be delivered via UPS to the prescription address in Epic WAM.    [name]   [facility]  Specialty Technician

## 2023-09-16 MED ORDER — INSULIN ASPART (U-100) 100 UNIT/ML (3 ML) SUBCUTANEOUS PEN
Freq: Three times a day (TID) | SUBCUTANEOUS | 0 refills | 57 days | Status: CP
Start: 2023-09-16 — End: ?
  Filled 2023-09-17: qty 60, 57d supply, fill #0

## 2023-09-17 MED FILL — SEVELAMER HCL 800 MG TABLET: ORAL | 90 days supply | Qty: 540 | Fill #1

## 2023-09-17 MED FILL — MYCOPHENOLATE MOFETIL 250 MG CAPSULE: ORAL | 30 days supply | Qty: 60 | Fill #4

## 2023-09-17 MED FILL — PROGRAF 0.5 MG CAPSULE: ORAL | 30 days supply | Qty: 120 | Fill #4

## 2023-09-18 NOTE — Unmapped (Signed)
 1st attempt to contact this patient to discuss rescheduling Kidney Txp checklist appts.  LVM  Needs Surg & SW

## 2023-09-19 NOTE — Unmapped (Signed)
 Spoke to patient to discuss scheduling Kidney Txp SW & Surg.  Stated that he hasn't decided on a CG yet.  Message sent to SW and TNC on next step for scheduling.

## 2023-09-30 NOTE — Unmapped (Signed)
 Internal Medicine Clinic Visit    Reason for visit: 58 year old male with PMH of OLT 2013, T2DM (last 10.5% 02/2023), HFpEF and hypertension     A/P:         Assessment & Plan  Abdominal Pain  Chronic abdominal pain for one year, worsens with standing or walking, with nausea and vomiting. Differential includes mesenteric ischemia (highest on differential given close association w/ standing/exertion/better w/ rest, although notably not worsened by food), gastritis (known, mild on recent endoscopy), unlikely gastroparesis given no association w/ food. Previous endoscopy showed minimal gastritis, negative H. pylori. Concerns about mesenteric ischemia. Reasonably high on ddx too is IBS given relief w/ BM.   - Order CT scan of the abdomen with contrast mesenteric ischemia  - Consulted w/ nephrologist Dr. Gerald Stabs regarding contrast use.  - Informed liver transplant team about abdominal pain for additional input, and they agree w/ my plan   - Performed ECG to rule out ACS, had minimal ST depression in 1 anterolat lead, overall not suspicious for ischemia, notably does have afib   - CBC diff, CMP     Afib, suspect relatively new but longstanding  Not symptomatic, denies SOB, palpitations. Noted on ECG 09/2023 but was sinus 2024 ecg's w/ 1st deg AV block, RBBB. Noted even before then. Saw Dr Tanda Rockers in Cardiology but wanted to defer A/c as of then. Low HASBLED w/o prior major bleeding event or current liver disease. 3-4 points for CHADSVASC2 score.   - Reviewed risks/benefits via phone and plan to start Apixaban 5 mg BID  - referred to EP for rhythm control and eval of conduction age at young age     Chronic Kidney Disease (CKD) on Hemodialysis  End-stage renal disease on hemodialysis at [place], [place], [place]. Dialysis via right upper extremity fistula. No recent dialysis issues. Evaluatation ongoing for kidney transplant.    Diabetes Mellitus  Poorly controlled diabetes, last A1c 11.8% in March 2025. On high doses of prandial short acting insulin relative to his basal insulin. Foot pain and neuropathy symptoms suggest diabetic neuropathy.   - Schedule follow-up appointment in 5-6 weeks for diabetes management.  - Refer to podiatrist for diabetic foot care and custom shoes.  - Performed foot exam to assess for neuropathy and per care partner he has good sensation of feet, I checked for ulcers  - would start GPN prn for neuropathy  - start statin next visit         Return in about 5 weeks (around 11/05/2023), or w/ me for diabetes.    Staffed with Dr. Freida Busman, discussed    __________________________________________________________    HPI:    History of Present Illness  He is a 58 year old with diabetes and ESRD on hemodialysis who presents with severe abdominal pain and foot pain.    He experiences severe abdominal pain that occurs when standing or walking for extended periods, persisting for about a year and progressively worsening.  It is lower abdominal pain that stretches in a band across his abdomen.  The pain is intense, crampy, and often accompanied by an urge to have a bowel movement, nausea, and sometimes vomiting or dry heaving if his stomach is empty. The pain lasts approximately 45 minutes and is relieved by sitting or lying down. It is not provoked by eating and can occur even on an empty stomach. No fever, chills, night sweats, diarrhea, or blood in stool. He has been able to eat and drink, though he cannot eat much.  He reports loose stools for months, sometimes greenish in color but for the most part has not really looked. An endoscopy in May 2024 showed minimal gastritis and was negative for H. pylori.    He has a history of poorly controlled diabetes, with a recent A1c of 11.8% in March 2025. He has not used semaglutide or Ozempic for over a year. He is currently undergoing hemodialysis via a right upper extremity fistula.    He reports foot pain, particularly when curling his toes or walking barefoot, describing a sensation of 'needles' when touched. He has not had a foot exam recently and is unaware of diabetic shoes. No ulcers or wounds on his feet but mentions a callus and a crack in his toenail.    He denies smoking, drug use, and alcohol consumption. He is a transplant patient and has been taking mycophenolate for over ten years.    He has a family history of stomach cancer, with an aunt who died from it.  __________________________________________________________        Medications:  Reviewed in EPIC  __________________________________________________________    Physical Exam:   Vital Signs:  Vitals:    10/01/23 0818   Pulse: 71   Temp: 36.4 ??C (97.6 ??F)   TempSrc: Temporal   SpO2: 98%   Weight: (!) 123.4 kg (272 lb)          PTHomeBP      Physical Exam    Gen: Middle-age male, NAD  HEENT: anicteric, no palpable lymphadenopathy of the neck/clavicles  CV: irregular rhythm, difficult to say irreg irreg vs irreg due to premature beats , no murmurs  Pulm: CTA bilaterally, no crackles or wheezes  ABDOMEN: Tenderness in the left upper quadrant.  EXTREMITIES: No ulcers or wounds on the feet. Callus underneath the little toe on the right foot. Darkening and crack in the base of the nail of the left big toe. No edema of lower extremities  MSK: ambulates w/o assistive device     PHQ-9 Score:     GAD-7 Score:       Medication adherence and barriers to the treatment plan have been addressed. Opportunities to optimize healthy behaviors have been discussed. Patient / caregiver voiced understanding.

## 2023-10-01 ENCOUNTER — Ambulatory Visit: Admit: 2023-10-01 | Discharge: 2023-10-02

## 2023-10-01 DIAGNOSIS — R109 Unspecified abdominal pain: Principal | ICD-10-CM

## 2023-10-01 DIAGNOSIS — I4819 Other persistent atrial fibrillation: Principal | ICD-10-CM

## 2023-10-01 DIAGNOSIS — E1165 Type 2 diabetes mellitus with hyperglycemia: Principal | ICD-10-CM

## 2023-10-01 DIAGNOSIS — I440 Atrioventricular block, first degree: Principal | ICD-10-CM

## 2023-10-01 DIAGNOSIS — Z794 Long term (current) use of insulin: Principal | ICD-10-CM

## 2023-10-01 DIAGNOSIS — E1159 Type 2 diabetes mellitus with other circulatory complications: Principal | ICD-10-CM

## 2023-10-01 LAB — COMPREHENSIVE METABOLIC PANEL
ALBUMIN: 3.4 g/dL (ref 3.4–5.0)
ALKALINE PHOSPHATASE: 129 U/L — ABNORMAL HIGH (ref 46–116)
ALT (SGPT): 24 U/L (ref 10–49)
ANION GAP: 17 mmol/L — ABNORMAL HIGH (ref 5–14)
AST (SGOT): 27 U/L (ref <=34)
BILIRUBIN TOTAL: 0.6 mg/dL (ref 0.3–1.2)
BLOOD UREA NITROGEN: 30 mg/dL — ABNORMAL HIGH (ref 9–23)
BUN / CREAT RATIO: 3
CALCIUM: 9.3 mg/dL (ref 8.7–10.4)
CHLORIDE: 97 mmol/L — ABNORMAL LOW (ref 98–107)
CO2: 21 mmol/L (ref 20.0–31.0)
CREATININE: 9.31 mg/dL — ABNORMAL HIGH (ref 0.73–1.18)
EGFR CKD-EPI (2021) MALE: 6 mL/min/1.73m2 — ABNORMAL LOW (ref >=60)
GLUCOSE RANDOM: 204 mg/dL — ABNORMAL HIGH (ref 70–99)
POTASSIUM: 3.8 mmol/L (ref 3.4–4.8)
PROTEIN TOTAL: 7.2 g/dL (ref 5.7–8.2)
SODIUM: 135 mmol/L (ref 135–145)

## 2023-10-01 LAB — CBC W/ AUTO DIFF
BASOPHILS ABSOLUTE COUNT: 0 10*9/L (ref 0.0–0.1)
BASOPHILS RELATIVE PERCENT: 0.6 %
EOSINOPHILS ABSOLUTE COUNT: 0.2 10*9/L (ref 0.0–0.5)
EOSINOPHILS RELATIVE PERCENT: 3.6 %
HEMATOCRIT: 39.5 % (ref 39.0–48.0)
HEMOGLOBIN: 13.1 g/dL (ref 12.9–16.5)
LYMPHOCYTES ABSOLUTE COUNT: 1 10*9/L — ABNORMAL LOW (ref 1.1–3.6)
LYMPHOCYTES RELATIVE PERCENT: 14.5 %
MEAN CORPUSCULAR HEMOGLOBIN CONC: 33.3 g/dL (ref 32.0–36.0)
MEAN CORPUSCULAR HEMOGLOBIN: 29.3 pg (ref 25.9–32.4)
MEAN CORPUSCULAR VOLUME: 88.1 fL (ref 77.6–95.7)
MEAN PLATELET VOLUME: 8.1 fL (ref 6.8–10.7)
MONOCYTES ABSOLUTE COUNT: 0.6 10*9/L (ref 0.3–0.8)
MONOCYTES RELATIVE PERCENT: 9.1 %
NEUTROPHILS ABSOLUTE COUNT: 4.9 10*9/L (ref 1.8–7.8)
NEUTROPHILS RELATIVE PERCENT: 72.2 %
PLATELET COUNT: 218 10*9/L (ref 150–450)
RED BLOOD CELL COUNT: 4.48 10*12/L (ref 4.26–5.60)
RED CELL DISTRIBUTION WIDTH: 13.6 % (ref 12.2–15.2)
WBC ADJUSTED: 6.7 10*9/L (ref 3.6–11.2)

## 2023-10-01 MED ORDER — APIXABAN 5 MG TABLET
ORAL_TABLET | Freq: Two times a day (BID) | ORAL | 3 refills | 90.00 days | Status: CP
Start: 2023-10-01 — End: ?

## 2023-10-01 NOTE — Unmapped (Signed)
 VISIT SUMMARY:  You came in today with severe abdominal pain and foot pain. We discussed your symptoms, including the nature and duration of your abdominal pain and your foot pain, which you described as feeling like 'needles' when touched. We also reviewed your history of diabetes and chronic kidney disease, and your current treatments.    YOUR PLAN:  -ABDOMINAL PAIN: Your abdominal pain has been ongoing for a year and worsens with standing or walking. We are concerned about possible mesenteric ischemia, which is a condition where blood flow to the intestines is reduced. We will order a CT scan of your abdomen with contrast, pending approval from your nephrologist, and consult with your liver transplant team for additional input. An ECG will also be performed to rule out any heart-related causes.    -CHRONIC KIDNEY DISEASE (CKD) ON HEMODIALYSIS: You have end-stage renal disease and are currently on hemodialysis. There have been no recent issues with your dialysis sessions. Continue your regular dialysis sessions at [facility] as scheduled.    -DIABETES MELLITUS: Your diabetes is poorly controlled, with a recent A1c of 11.28%. This indicates that your blood sugar levels have been high over the past few months. We will schedule a follow-up appointment in 5-6 weeks to manage your diabetes better. You will also be referred to a podiatrist for diabetic foot care and custom shoes. A foot exam will be performed to check for neuropathy and ulcers.    -GENERAL HEALTH MAINTENANCE: Given your history of liver transplant, it is important to regularly monitor your liver function. We will order liver enzyme tests and ensure that your liver function is checked every six months.    INSTRUCTIONS:  Please follow up with your nephrologist, Dr. Gerald Stabs, regarding the use of contrast for your CT scan. Attend your regular dialysis sessions at [facility]. Schedule an appointment with a podiatrist for diabetic foot care and custom shoes. We will see you in 5-6 weeks for a follow-up on your diabetes management. Additionally, ensure that your liver function is monitored every six months.

## 2023-10-01 NOTE — Unmapped (Signed)
 [facility] at [facility]     Reason for visit: Follow up    Questions / Concerns that need to be addressed:     Screening BP-     Omron BPs (complete if screening BP has a systolic  > 130 or diastolic > 80)  BP#1 143/94   BP#2 138/95  BP#3 133/96    Average BP 138/95  (please note this as a comment in vitals)         HCDM reviewed and updated in Epic:    We are working to make sure all of our patients??? wishes are updated in Epic and part of that is documenting a Environmental health practitioner for each patient  A Health Care Decision Maker is someone you choose who can make health care decisions for you if you are not able - who would you most want to do this for you????  is already up to date.    HCDM (patient stated preference): [name] Sister - [phone]

## 2023-10-02 NOTE — Unmapped (Signed)
 Spoke to patient per request of TNC. Pt unhappy with committee decision to close evaluation. Explained that unfortunately the committee's decision is final but encouraged him to continue to work to improve A1C and have his dialysis center re-refer him in 6 months.

## 2023-10-02 NOTE — Unmapped (Signed)
 Called patient to discuss team's decision that his evaluation is now closed.  Explained that he has not follow up with the post liver team since March 2023, his A1C is elevated and he does not have a care giver.  He stated I would like to dispute that.  He stated that he didn't have a CG for the date that was offered to him, that he has been to the hospital 3 times last year, and his A1C can come down.  I offered for him to be contacted by a physician and he agreed.      HgbA1C 11.8 on 08/28/2023.  Patient no showed transplant evaluation appointments on 08/21/2023, cancelled 06/29/2023.  He no showed post liver appointment 02/02/2023.

## 2023-10-10 DIAGNOSIS — E612 Magnesium deficiency: Principal | ICD-10-CM

## 2023-10-10 DIAGNOSIS — Z5181 Encounter for therapeutic drug level monitoring: Principal | ICD-10-CM

## 2023-10-10 DIAGNOSIS — Z944 Liver transplant status: Principal | ICD-10-CM

## 2023-10-15 DIAGNOSIS — Z944 Liver transplant status: Principal | ICD-10-CM

## 2023-10-15 DIAGNOSIS — Z5181 Encounter for therapeutic drug level monitoring: Principal | ICD-10-CM

## 2023-10-15 DIAGNOSIS — E612 Magnesium deficiency: Principal | ICD-10-CM

## 2023-10-16 DIAGNOSIS — I5032 Chronic diastolic (congestive) heart failure: Principal | ICD-10-CM

## 2023-10-16 DIAGNOSIS — I10 Essential (primary) hypertension: Principal | ICD-10-CM

## 2023-10-16 DIAGNOSIS — G629 Polyneuropathy, unspecified: Principal | ICD-10-CM

## 2023-10-16 DIAGNOSIS — E119 Type 2 diabetes mellitus without complications: Principal | ICD-10-CM

## 2023-10-16 DIAGNOSIS — Z794 Long term (current) use of insulin: Principal | ICD-10-CM

## 2023-10-16 MED ORDER — CARVEDILOL 6.25 MG TABLET
ORAL_TABLET | Freq: Two times a day (BID) | ORAL | 3 refills | 90.00 days | Status: CP
Start: 2023-10-16 — End: 2024-10-15
  Filled 2023-10-18: qty 180, 90d supply, fill #0

## 2023-10-16 MED ORDER — DEXCOM G6 SENSOR DEVICE
Freq: Every day | 3 refills | 90.00 days | Status: CP
Start: 2023-10-16 — End: 2024-01-14

## 2023-10-16 MED ORDER — DEXCOM G6 TRANSMITTER DEVICE
3 refills | 0.00 days | Status: CP
Start: 2023-10-16 — End: ?

## 2023-10-16 MED ORDER — GABAPENTIN 300 MG CAPSULE
ORAL_CAPSULE | Freq: Every evening | ORAL | 12 refills | 30.00 days | Status: CP
Start: 2023-10-16 — End: 2024-11-09
  Filled 2023-10-18: qty 30, 30d supply, fill #0

## 2023-10-16 NOTE — Unmapped (Signed)
Immediately after or during the visit, I reviewed with the resident the medical history and the resident’s findings on physical examination.  I discussed with the resident the patient’s diagnosis and concur with the treatment plan as documented in the resident note. Reagen Haberman R [name] Casaus, MD

## 2023-10-16 NOTE — Unmapped (Signed)
 Orders entered

## 2023-10-17 DIAGNOSIS — E1142 Type 2 diabetes mellitus with diabetic polyneuropathy: Principal | ICD-10-CM

## 2023-10-17 DIAGNOSIS — E1165 Type 2 diabetes mellitus with hyperglycemia: Principal | ICD-10-CM

## 2023-10-17 DIAGNOSIS — M79672 Pain in left foot: Principal | ICD-10-CM

## 2023-10-17 DIAGNOSIS — Z794 Long term (current) use of insulin: Principal | ICD-10-CM

## 2023-10-17 DIAGNOSIS — M79671 Pain in right foot: Principal | ICD-10-CM

## 2023-10-17 MED ORDER — LEVEMIR FLEXPEN 100 UNIT/ML (3 ML) SOLUTION SUBCUTANEOUS INSULIN PEN
Freq: Every evening | SUBCUTANEOUS | 3 refills | 83.00 days | Status: CP
Start: 2023-10-17 — End: ?
  Filled 2023-10-23: qty 15, 83d supply, fill #0

## 2023-10-17 NOTE — Unmapped (Signed)
 [facility]ty and Home Delivery Pharmacy Refill Coordination Note    Specialty Medication(s) to be Shipped:   Transplant: mycophenolate  mofetil 250mg  and Prograf  1mg     Other medication(s) to be shipped:  carvedilol , Dexcom sensor, Dexcom transmitter, gabapentin , Mounjaro ,  Levemir      Richard [name], DOB: [birth year]  Phone: [phone] (home)       All above HIPAA information was verified with patient.     Was a Nurse, learning disability used for this call? No    Completed refill call assessment today to schedule patient's medication shipment from the [facility]  [phone]).  All relevant notes have been reviewed.     Specialty medication(s) and dose(s) confirmed: Regimen is correct and unchanged.   Changes to medications: Tawan reports no changes at this time.  Changes to insurance: No  New side effects reported not previously addressed with a pharmacist or physician: None reported  Questions for the pharmacist: No    Confirmed patient received a Conservation officer, historic buildings and a Surveyor, mining with first shipment. The patient will receive a drug information handout for each medication shipped and additional FDA Medication Guides as required.       DISEASE/MEDICATION-SPECIFIC INFORMATION        N/A    SPECIALTY MEDICATION ADHERENCE     Medication Adherence    Patient reported X missed doses in the last month: 0  Specialty Medication: Mycophenolate  250mg   Patient is on additional specialty medications: Yes  Additional Specialty Medications: Prograf  0.5mg   Patient Reported Additional Medication X Missed Doses in the Last Month: 0  Patient is on more than two specialty medications: No  Informant: patient  Adherence tools used: patient uses a pill box to manage medications              Were doses missed due to medication being on hold? No    mycophenolate  250 mg: 4 days of medicine on hand   Prograf  0.5 mg: 4 days of medicine on hand       REFERRAL TO PHARMACIST     Referral to the pharmacist: Not needed      [facility]     Shipping address confirmed in Epic.     Cost and Payment: Patient has a $0 copay, payment information is not required.    Delivery Scheduled: Yes, Expected medication delivery date: 4/25 (except Levemir  4/30).     Medication will be delivered via UPS to the prescription address in Epic WAM.    Richard [name], PharmD   [facility] Delivery [facility]  Specialty Pharmacist

## 2023-10-18 DIAGNOSIS — E1165 Type 2 diabetes mellitus with hyperglycemia: Principal | ICD-10-CM

## 2023-10-18 DIAGNOSIS — Z794 Long term (current) use of insulin: Principal | ICD-10-CM

## 2023-10-18 MED FILL — DEXCOM G6 SENSOR DEVICE: 90 days supply | Qty: 9 | Fill #0

## 2023-10-18 MED FILL — MYCOPHENOLATE MOFETIL 250 MG CAPSULE: ORAL | 30 days supply | Qty: 60 | Fill #5

## 2023-10-18 MED FILL — DEXCOM G6 TRANSMITTER DEVICE: 30 days supply | Qty: 1 | Fill #0

## 2023-10-18 MED FILL — PROGRAF 0.5 MG CAPSULE: ORAL | 30 days supply | Qty: 120 | Fill #5

## 2023-10-22 DIAGNOSIS — Z944 Liver transplant status: Principal | ICD-10-CM

## 2023-10-22 DIAGNOSIS — Z5181 Encounter for therapeutic drug level monitoring: Principal | ICD-10-CM

## 2023-10-22 DIAGNOSIS — E612 Magnesium deficiency: Principal | ICD-10-CM

## 2023-10-25 DIAGNOSIS — Z794 Long term (current) use of insulin: Principal | ICD-10-CM

## 2023-10-25 DIAGNOSIS — E1159 Type 2 diabetes mellitus with other circulatory complications: Principal | ICD-10-CM

## 2023-10-29 DIAGNOSIS — Z944 Liver transplant status: Principal | ICD-10-CM

## 2023-10-29 DIAGNOSIS — E612 Magnesium deficiency: Principal | ICD-10-CM

## 2023-10-29 DIAGNOSIS — Z5181 Encounter for therapeutic drug level monitoring: Principal | ICD-10-CM

## 2023-10-30 ENCOUNTER — Inpatient Hospital Stay: Admit: 2023-10-30 | Discharge: 2023-10-30 | Payer: BLUE CROSS/BLUE SHIELD

## 2023-10-30 DIAGNOSIS — Z794 Long term (current) use of insulin: Principal | ICD-10-CM

## 2023-10-30 DIAGNOSIS — E1159 Type 2 diabetes mellitus with other circulatory complications: Principal | ICD-10-CM

## 2023-10-30 MED ORDER — MOUNJARO 5 MG/0.5 ML SUBCUTANEOUS PEN INJECTOR
SUBCUTANEOUS | 3 refills | 0.00000 days | Status: CP
Start: 2023-10-30 — End: 2024-01-28

## 2023-10-30 MED ADMIN — iohexol (OMNIPAQUE) 350 mg iodine/mL solution 100 mL: 100 mL | INTRAVENOUS | @ 17:00:00 | Stop: 2023-10-30

## 2023-11-05 DIAGNOSIS — Z944 Liver transplant status: Principal | ICD-10-CM

## 2023-11-05 DIAGNOSIS — Z5181 Encounter for therapeutic drug level monitoring: Principal | ICD-10-CM

## 2023-11-05 DIAGNOSIS — E612 Magnesium deficiency: Principal | ICD-10-CM

## 2023-11-06 DIAGNOSIS — N186 End stage renal disease: Principal | ICD-10-CM

## 2023-11-07 ENCOUNTER — Inpatient Hospital Stay: Admit: 2023-11-07 | Discharge: 2023-11-08 | Payer: BLUE CROSS/BLUE SHIELD

## 2023-11-07 MED ADMIN — midazolam (PF) (VERSED) 1 mg/mL injection: INTRAVENOUS | @ 19:00:00 | Stop: 2023-11-07

## 2023-11-07 MED ADMIN — fentaNYL (PF) (SUBLIMAZE) injection: INTRAVENOUS | @ 19:00:00 | Stop: 2023-11-07

## 2023-11-07 MED ADMIN — lidocaine (XYLOCAINE) 10 mg/mL (1 %) injection: SUBCUTANEOUS | @ 19:00:00 | Stop: 2023-11-07

## 2023-11-07 MED ADMIN — iohexol (OMNIPAQUE) 300 mg iodine/mL solution 35 mL: 35 mL | INTRAVENOUS | @ 19:00:00 | Stop: 2023-11-07

## 2023-11-07 NOTE — Unmapped (Signed)
 Pressure dressing in place and can removed at next dialysis session. AVS reviewed with pt and family, both verbalized understanding. Patient discharged from PRU in stable condition.

## 2023-11-07 NOTE — Unmapped (Signed)
 VIR Post-Procedure Note    Procedure Name: Dialysis Circuit Study    Pre-Op Diagnosis: Recirculation with dialysis    Post-Op Diagnosis: Same as pre-operative diagnosis    VIR Providers    Attending: Dr. Garrick Kanaris [place]  Fellow/Resident: Dr. Landrum Pink    Time out: Prior to the procedure, a time out was performed with all team members present. During the time out, the patient, procedure and procedure site when applicable were verbally verified by the team members and Dr. Landrum Pink.    Description of procedure:   -65F access into RUE BC AVF  -Angiography demonstrated no stenosis, thrombosis, or other abnormality of the AVF. No intervention performed.     Sedation: Moderate    Estimated Blood Loss: Minimal  Specimens: None   Complications: None    Plan:  -Fistula ok for use.   -Additional management of recirculation issues per nephrology.    See detailed procedure note with images in PACS.    The patient tolerated the procedure well without incident or complication and was transported from VIR in stable condition.    Odetta Benes, MD  [facility], PGY-6  11/07/2023 3:22 PM

## 2023-11-07 NOTE — Unmapped (Signed)
 [facility] - Pre Procedure H/P  Patient name: Richard [name]  CSN: [id number]  MRN: [id number]    Assessment/Plan:    [name] is a 58 y.o. male who will undergo dialysis circuit study in Interventional Radiology.    Consent obtained by Dr. Landrum Pink.  Risks, benefits, and alternatives including but not limited to bleeding, infection, and damage to adjacent structures were discussed with patient/patient's representative. All questions were answered to patient/patient's representative satisfaction.  Patient/Patient's representative consents and would like to proceed with the procedure.  --The patient will accept blood products in an emergent situation.  --The patient does not have a Do Not Resuscitate order in effect.      HPI: [name] is a 58 y.o. male with RUE BC AVF created 11/29/22.    Here today due to concern for stenosis during dialysis (T/Th/S). Reports of recirculation but no evidence of decreased flows or prolonged bleeding. Fistula with thrill, pulsatility, and collapsibility.     NPO since yesterday evening.       Past Medical History:   Diagnosis Date    COVID-19 07/18/2019    Diabetes mellitus       Family history of malignant neoplasm of prostate 06/23/2015    Hepatic cirrhosis   06/23/2015    Overview:  Secondary to NASH; followed by Dr. Danella Dunn, GI.  Last Assessment & Plan:  Relevant Hx: Course: Daily Update: Today's Plan:     Hypertension     Hypogonadism in male 02/12/2014    Liver cirrhosis secondary to NASH (nonalcoholic steatohepatitis)       s/p liver transplant 2013       Past Surgical History:   Procedure Laterality Date    liver transpant      LIVER TRANSPLANTATION  2013    PR AV FIST REVISE GRFT,W THROMBECTOMY Right 01/15/2023    Procedure: REVISION, ARTERIOVENOUS FISTULA W/ THROMBECTOMY, AUTOGENOUS OR NONAUTOGENOUS DIALYSIS GRAFT (SEP. PROC), LOWER EXTREMITY;  Surgeon: Mardeen Shackleton, MD;  Location: [facility];  Service: General Surgery    PR COLONOSCOPY W/BIOPSY SINGLE/MULTIPLE N/A 08/13/2018    Procedure: COLONOSCOPY, FLEXIBLE, PROXIMAL TO SPLENIC FLEXURE; WITH BIOPSY, SINGLE OR MULTIPLE;  Surgeon: Loel Ring, MD;  [facility]: [facility];  Service: Gastroenterology    PR COLSC FLX W/RMVL OF TUMOR POLYP LESION SNARE TQ N/A 08/13/2018    Procedure: COLONOSCOPY FLEX; W/REMOV TUMOR/LES BY SNARE;  Surgeon: Loel Ring, MD;  Location: GI PROCEDURES [facility];  Service: Gastroenterology    PR CREAT AV FISTULA,NON-AUTOGENOUS GRAFT Right 11/29/2022    Procedure: CREATE AV FISTULA (SEPARATE PROC); NONAUTOGENOUS GRAFT (EG, BIOLOGICAL COLLAGEN, THERMOPLASTIC GRAFT), UPPER EXTREMITY;  Surgeon: Mardeen Shackleton, MD;  Location: [facility] OR [facility];  Service: General Surgery    PR UPPER GI ENDOSCOPY,BIOPSY N/A 07/13/2015    Procedure: UGI ENDOSCOPY; WITH BIOPSY, SINGLE OR MULTIPLE;  Surgeon: Lanelle Pinto, MD;  Location: GI PROCEDURES [facility];  Service: Gastroenterology    PR UPPER GI ENDOSCOPY,BIOPSY N/A 02/13/2023    Procedure: UGI ENDOSCOPY; WITH BIOPSY, SINGLE OR MULTIPLE;  Surgeon: Celestia Colander, MD;  Location: GI PROCEDURES [facility];  Service: Gastroenterology        Allergies: No Known Allergies    Medications:  Eliquis     ASA Grade: ASA 3 - Patient with moderate systemic disease with functional limitations    PE:    Vitals:    11/07/23 1308   BP: 129/71   Pulse: 79   Resp:  14   Temp: 36.7 ??C (98.1 ??F)   SpO2: 98%     General: male in NAD.  Airway assessment: Class 3 - Can visualize soft palate  Lungs: Respirations nonlabored  Left Radial Bar[place]au: N/A

## 2023-11-08 MED ORDER — MOUNJARO 5 MG/0.5 ML SUBCUTANEOUS PEN INJECTOR
SUBCUTANEOUS | 3 refills | 0.00000 days
Start: 2023-11-08 — End: 2024-02-06

## 2023-11-08 NOTE — Unmapped (Signed)
 Internal Medicine Clinic Visit    Reason for visit: 58 year old male with PMH of OLT 2013, T2DM on insulin , afib, chronic abdominal pain, HFpEF and hypertension       A/P:         Assessment & Plan  Diabetes Mellitus type II on insulin  long-term, complicated by neuropathy  Lab Results   Component Value Date    A1C 11.8 08/28/2023     Poorly controlled diabetes, last A1c 11.8% in March 2025. Uses a dexcom but has some difficulty interpreting data.  On high doses of prandial short acting insulin  (aspart 40-50 w/ good SSI u w/ meals) relative to his basal insulin  (30 u qhs). Recently started (08/2023) on Mounjaro  and it seems like we are limited in increasing this dose due to symptoms (has some nausea that is tolerable, can only eat 2 medium sized meals daily). Foot pain and neuropathy symptoms suggest diabetic neuropathy, resolved w/ GPN.   Wt Readings from Last 6 Encounters:   11/09/23 (!) 127 kg (280 lb)   10/01/23 (!) 123.4 kg (272 lb)   03/13/23 (!) 115.7 kg (255 lb 1.6 oz)   02/13/23 (!) 115.2 kg (254 lb)   02/09/23 (!) 115.2 kg (254 lb)   02/06/23 (!) 115.8 kg (255 lb 6.4 oz)   Actually has had some weight gain according to our records but he notes some weight loss after starting Mounjaro . Insufficient basal insulin  with current regimen.  Advised today with goal of shifting closer to 50-50 basal bolus ratio.  Goal A1c <7%.  - Continue tirzepatide  (Mounjaro ) 5 mg weekly.  - Adjust insulin : Levemir  45 units at night, NovoLog  25 units with meals.  - Increase Levemir  by 3 units if morning glucose >150 for 2 days consecutively.  - Reassess insulin  regimen in 1 month.  - Refer to podiatry for custom shoes.  - Started atorvastatin  40 mg daily    Atrial Fibrillation, suspect relatively new but longstanding  Not symptomatic, denies SOB, palpitations. Noted on ECG 09/2023 but was sinus 2024 ecg's w/ 1st deg AV block, RBBB. Noted even before then. Saw Dr Era Hasty in Cardiology but wanted to defer A/c as of then. Low HASBLED w/o prior major bleeding event or current liver disease. 3-4 points for CHADSVASC2 score. Increased stroke risk due to irregular heart rhythm, this represents a major risk to bodily function given he has poorly controlled diabetes and up to this point has not been on a statin medication giving him a very high risk for stroke. Discussed anticoagulation benefits and risks with Eliquis .  - Initiate Eliquis  (apixaban ) twice daily, reviewed risks like more bleeding w/ HD and benefits today.  - would CONSIDER apixaban  at half dose if too much bleeding w/ dialysis (there's some data this is effective in VTE, may be extrapolated to suggest some benefit for him compared w/ no anticoagulation)   - Educated on Eliquis  side effects, including bleeding.  - Consider Xarelto if insurance issues arise.  - referred to EP for rhythm control and eval of conduction age at young age     Abdominal Pain  Chronic abdominal pain for one year, worsens with standing or walking, with nausea and vomiting. Frequent, near daily. Accompanied by urge to defecate. Stools are green and loose. Ruled out mesenteric ischemia w/ CT mesenteric ischemia protocol 10/2023, gastritis (known, mild on recent endoscopy), unlikely gastroparesis w/o any association w/ food, or myfortic  related colitis (last colonoscopy 2020, only w/ 1 sessile, serrated polyp than that was small, next  recommended colo in 5-10 years). Highest on ddx too is IBS given relief w/ BM and change in stool frequency/character meeting Rome criteria for diagnosis. Discussed the latter today and given info re: IBS dx/diet.  Would focus on lifestyle/exercise portion before starting meds for this.  Complete further lab testing in the future:  - repeat stool lactoferrin or calprotectin (lactoferr positive 2017)   - stool Giardia test   - anti TTG serology     Chronic Kidney Disease (CKD) on Hemodialysis  End-stage renal disease on hemodialysis at [facility], [place], [place]. Dialysis via right upper extremity fistula. No recent dialysis issues. Evaluatation ongoing for kidney transplant.    General Health Maintenance  Pneumonia vaccine administered. Eye examination completed.  - Ensure up-to-date vaccinations.  - Continue regular eye examinations.      Return in about 4 weeks (around 12/07/2023).    Staffed with Dr. Caryn Clause, discussed    __________________________________________________________    HPI:    History of Present Illness    Richard [name] is a 58 year old male with diabetes who presents for a follow-up appointment.    He is currently using tirzepatide  (Mounjaro ) at a dose of 5 mg weekly for diabetes management. His appetite has decreased since starting the medication, and he experiences occasional nausea, though it is not severe. No constipation is reported. He has been on this medication for a short period.    He is also on insulin  therapy, administering approximately 40 to 50 units of insulin  with meals, adjusting based on a sliding scale. For nightly insulin , he uses Levemir , currently at 30 units. If he skips his morning insulin , his blood sugar can rise from 180 to 300 without any food intake. He does not experience hypoglycemic symptoms, and his blood sugar rarely drops below 130. He feels shaky if it reaches 100, but this is rare.    He has been referred to podiatry for custom shoes due to foot pain and neuropathy, although most of the neuropathy symptoms have subsided with his GPN. He has not yet been contacted by the podiatry office for an appointment.    He reports a recent episode of atrial fibrillation, documented during a procedure involving a chest monitor. He has experienced this twice, as noted by his Apple watch. He is concerned about starting anticoagulation therapy due to potential side effects, particularly bleeding, as he already experiences bleeding issues ww/ his fistula post-dialysis.    Regarding his diet, he typically consumes two meals a day, sometimes only one, with meals consisting of moderate portions such as roasted chicken, yam, and broccoli. He drinks diet beverages and uses smaller plates for his meals. He has noticed a slight weight decrease since starting Mounjaro .  __________________________________________________________        Medications:  Reviewed in EPIC  __________________________________________________________    Physical Exam:   Vital Signs:  Vitals:    11/09/23 0834   BP: 118/69   BP Site: L Arm   BP Position: Sitting   BP Cuff Size: Large   Pulse: 56   Temp: 36.6 ??C (97.8 ??F)   TempSrc: Temporal   SpO2: 98%   Weight: (!) 127 kg (280 lb)   Height: 172.7 cm (5' 8)            PTHomeBP      Physical Exam    Gen: Middle-age male, NAD  HEENT: anicteric, no palpable lymphadenopathy of the neck/clavicles  CV: RRR, no murmurs  Pulm: CTA bilaterally, no crackles  or wheezes  ABDOMEN: nontender, nondistended, +central obesity   EXTREMITIES: No edema of lower extremities  MSK: ambulates w/o assistive device     PHQ-9 Score:  PHQ-9 Total Score: 0  GAD-7 Score:  GAD-7 Total Score: 0    Medication adherence and barriers to the treatment plan have been addressed. Opportunities to optimize healthy behaviors have been discussed. Patient / caregiver voiced understanding.

## 2023-11-09 ENCOUNTER — Ambulatory Visit: Admit: 2023-11-09 | Discharge: 2023-11-10 | Payer: BLUE CROSS/BLUE SHIELD

## 2023-11-09 DIAGNOSIS — E1159 Type 2 diabetes mellitus with other circulatory complications: Principal | ICD-10-CM

## 2023-11-09 DIAGNOSIS — I4819 Other persistent atrial fibrillation: Principal | ICD-10-CM

## 2023-11-09 DIAGNOSIS — E1142 Type 2 diabetes mellitus with diabetic polyneuropathy: Principal | ICD-10-CM

## 2023-11-09 DIAGNOSIS — Z794 Long term (current) use of insulin: Principal | ICD-10-CM

## 2023-11-09 DIAGNOSIS — E1165 Type 2 diabetes mellitus with hyperglycemia: Principal | ICD-10-CM

## 2023-11-09 MED ORDER — MOUNJARO 5 MG/0.5 ML SUBCUTANEOUS PEN INJECTOR
SUBCUTANEOUS | 0 refills | 84.00000 days | Status: CP
Start: 2023-11-09 — End: 2024-02-07
  Filled 2023-11-20: qty 6, 84d supply, fill #0

## 2023-11-09 MED ORDER — APIXABAN 5 MG TABLET
ORAL_TABLET | Freq: Two times a day (BID) | ORAL | 3 refills | 90.00000 days | Status: CP
Start: 2023-11-09 — End: ?
  Filled 2023-11-20: qty 180, 90d supply, fill #0

## 2023-11-09 MED ORDER — ATORVASTATIN 40 MG TABLET
ORAL_TABLET | Freq: Every day | ORAL | 3 refills | 100.00000 days | Status: CP
Start: 2023-11-09 — End: 2024-12-13
  Filled 2023-11-20: qty 90, 90d supply, fill #0

## 2023-11-09 NOTE — Unmapped (Addendum)
 Call [phone]  to schedule w/ Podiatrist in [facility]

## 2023-11-09 NOTE — Unmapped (Signed)
 [facility] Internal Medicine at [facility]     Reason for visit: Follow up    Questions / Concerns that need to be addressed: Patient declined covid vaccine today.      Screening BP- 118/69 56    Diabetes:  Regularly checking blood sugars?: yes  If yes, when? Complete log for past 7 days  Date Before Breakfast After Breakfast Before Lunch After Lunch Before Dinner After Dinner Before Bed    11/09/23  256                                                                                                                               Dexcom or Libre CGM in use? If so, pull appropriate reporting through portal (Dexcom) or EPIC order Richard [name]).    HCDM reviewed and updated in Epic:    We are working to make sure all of our patients??? wishes are updated in Epic and part of that is documenting a Environmental health practitioner for each patient  A Health Care Decision Maker is someone you choose who can make health care decisions for you if you are not able - who would you most want to do this for you????  is already up to date.    HCDM (patient stated preference): Richard [name] - Sister - [phone]    BPAs completed:  PHQ2, PHQ9, GAD7, Diabetes - retinal eye exam, and Pneumococcal vaccine    Annual Screenings:   Audit/Alcohol  and Health Literacy  __________________________________________________________________________________________    SCREENINGS COMPLETED IN FLOWSHEETS      AUDIT       PHQ2       PHQ9          GAD7       COPD Assessment       Falls Risk

## 2023-11-09 NOTE — Unmapped (Signed)
 Addended by: Jesse Moritz on: 11/09/2023 01:37 PM     Modules accepted: Level of Service

## 2023-11-12 DIAGNOSIS — E612 Magnesium deficiency: Principal | ICD-10-CM

## 2023-11-12 DIAGNOSIS — Z5181 Encounter for therapeutic drug level monitoring: Principal | ICD-10-CM

## 2023-11-12 DIAGNOSIS — Z944 Liver transplant status: Principal | ICD-10-CM

## 2023-11-14 NOTE — Unmapped (Signed)
 Sent updated lab order to [facility] lab.

## 2023-11-16 MED ORDER — PEN NEEDLE, DIABETIC 32 GAUGE X 5/32" (4 MM)
0 refills | 0.00000 days
Start: 2023-11-16 — End: ?

## 2023-11-16 NOTE — Unmapped (Signed)
 [facility] Specialty and Home Delivery Pharmacy Refill Coordination Note    Specialty Medication(s) to be Shipped:   Transplant: mycophenolate  mofetil 250mg  and Prograf  0.5mg     Other medication(s) to be shipped: pen needles, amlodipine , atorvastatin , mounjaro , eliquis , gabapentin      Richard [name], DOB: [birth year]  Phone: [phone] (home)       All above HIPAA information was verified with patient.     Was a Nurse, learning disability used for this call? No    Completed refill call assessment today to schedule patient's medication shipment from the [facility]  [phone]).  All relevant notes have been reviewed.     Specialty medication(s) and dose(s) confirmed: Regimen is correct and unchanged.   Changes to medications: Richard [name] reports no changes at this time.  Changes to insurance: No  New side effects reported not previously addressed with a pharmacist or physician: None reported  Questions for the pharmacist: No    Confirmed patient received a Conservation officer, historic buildings and a Surveyor, mining with first shipment. The patient will receive a drug information handout for each medication shipped and additional FDA Medication Guides as required.       DISEASE/MEDICATION-SPECIFIC INFORMATION        N/A    SPECIALTY MEDICATION ADHERENCE     Medication Adherence    Patient reported X missed doses in the last month: 0  Specialty Medication: Prograf  0.5mg   Patient is on additional specialty medications: Yes  Additional Specialty Medications: Mycophenolate  250mg   Patient Reported Additional Medication X Missed Doses in the Last Month: 0  Patient is on more than two specialty medications: No  Adherence tools used: patient uses a pill box to manage medications              Were doses missed due to medication being on hold? No    Prograf  0.5 mg: 7 days of medicine on hand   Mycophenolate  250 mg: 7 days of medicine on hand       REFERRAL TO PHARMACIST     Referral to the pharmacist: Not needed      [facility] Shipping address confirmed in Epic.     Cost and Payment: Patient has a $0 copay, payment information is not required.    Delivery Scheduled: Yes, Expected medication delivery date: 11/21/23.     Medication will be delivered via UPS to the prescription address in Epic WAM.    Richard [name], [facility]   [facility] Specialty and Home Delivery Pharmacy  Specialty Pharmacist

## 2023-11-19 DIAGNOSIS — Z944 Liver transplant status: Principal | ICD-10-CM

## 2023-11-19 DIAGNOSIS — Z5181 Encounter for therapeutic drug level monitoring: Principal | ICD-10-CM

## 2023-11-19 DIAGNOSIS — E612 Magnesium deficiency: Principal | ICD-10-CM

## 2023-11-20 MED ORDER — PEN NEEDLE, DIABETIC 32 GAUGE X 5/32" (4 MM)
0 refills | 0.00000 days
Start: 2023-11-20 — End: ?

## 2023-11-20 MED FILL — PROGRAF 0.5 MG CAPSULE: ORAL | 30 days supply | Qty: 120 | Fill #6

## 2023-11-20 MED FILL — AMLODIPINE 10 MG TABLET: ORAL | 90 days supply | Qty: 90 | Fill #3

## 2023-11-20 MED FILL — MYCOPHENOLATE MOFETIL 250 MG CAPSULE: ORAL | 30 days supply | Qty: 60 | Fill #6

## 2023-11-20 MED FILL — GABAPENTIN 300 MG CAPSULE: ORAL | 30 days supply | Qty: 30 | Fill #1

## 2023-11-21 MED ORDER — PEN NEEDLE, DIABETIC 32 GAUGE X 5/32" (4 MM)
ORAL | 0 refills | 0.00000 days | Status: CP
Start: 2023-11-21 — End: ?

## 2023-11-21 NOTE — Unmapped (Signed)
 Sent msg via Epic to patient, but received notification that it was not read. Left VM for patient, explaining that a tacrolimus  level has not been completed for almost 1 year, reminding him that we request patients complete txp labs every 3 months, to assure that the immunosuppressant level is within goal and that liver enzymes are stable. Let him know that new standing liver txp lab orders were sent to [facility] and requested he repeat them in the next couple weeks and update the team once completed.

## 2023-11-22 MED FILL — TRUEPLUS PEN NEEDLE 32 GAUGE X 5/32" (4 MM): ORAL | 25 days supply | Qty: 100 | Fill #0

## 2023-11-23 NOTE — Unmapped (Signed)
 Called pt to schedule annual appt in September. Explained to pt that his previous provider is no longer with the program and gave him info on new provider. He said that he prefers to be seen in person and is only able to be scheduled on Tuesdays after 11:30. Told him that new provider does not have clinic on Tuesdays, and this tpa will send her message to see if anything can be worked out, and will call him back after hearing from her. He verbalized understanding of all discussed.

## 2023-11-26 DIAGNOSIS — Z5181 Encounter for therapeutic drug level monitoring: Principal | ICD-10-CM

## 2023-11-26 DIAGNOSIS — E612 Magnesium deficiency: Principal | ICD-10-CM

## 2023-11-26 DIAGNOSIS — Z944 Liver transplant status: Principal | ICD-10-CM

## 2023-11-29 NOTE — Unmapped (Signed)
 COMPLEX CASE MANAGEMENT   Brief Note    Chart opened to update episode of care.    Care Coordination Note updated in [facility]: Yes    [facility] Management Assistant  [facility] Clinical Services  [address]  [place], [place] [address]  She/Her/Hers  P: [phone] F: [phone]  [email or web address]

## 2023-12-03 DIAGNOSIS — Z944 Liver transplant status: Principal | ICD-10-CM

## 2023-12-03 DIAGNOSIS — E612 Magnesium deficiency: Principal | ICD-10-CM

## 2023-12-03 DIAGNOSIS — Z5181 Encounter for therapeutic drug level monitoring: Principal | ICD-10-CM

## 2023-12-04 ENCOUNTER — Ambulatory Visit: Admit: 2023-12-04 | Discharge: 2023-12-05 | Payer: BLUE CROSS/BLUE SHIELD

## 2023-12-04 DIAGNOSIS — Z794 Long term (current) use of insulin: Principal | ICD-10-CM

## 2023-12-04 DIAGNOSIS — E1159 Type 2 diabetes mellitus with other circulatory complications: Principal | ICD-10-CM

## 2023-12-04 DIAGNOSIS — R112 Nausea with vomiting, unspecified: Principal | ICD-10-CM

## 2023-12-04 LAB — COMPREHENSIVE METABOLIC PANEL
ALBUMIN: 3.6 g/dL (ref 3.4–5.0)
ALKALINE PHOSPHATASE: 168 U/L — ABNORMAL HIGH (ref 46–116)
ALT (SGPT): 31 U/L (ref 10–49)
ANION GAP: 14 mmol/L (ref 5–14)
AST (SGOT): 29 U/L (ref <=34)
BILIRUBIN TOTAL: 0.9 mg/dL (ref 0.3–1.2)
BLOOD UREA NITROGEN: 11 mg/dL (ref 9–23)
BUN / CREAT RATIO: 3
CALCIUM: 9.5 mg/dL (ref 8.7–10.4)
CHLORIDE: 96 mmol/L — ABNORMAL LOW (ref 98–107)
CO2: 30.7 mmol/L (ref 20.0–31.0)
CREATININE: 4.13 mg/dL — ABNORMAL HIGH (ref 0.73–1.18)
EGFR CKD-EPI (2021) MALE: 16 mL/min/1.73m2 — ABNORMAL LOW (ref >=60)
GLUCOSE RANDOM: 223 mg/dL — ABNORMAL HIGH (ref 70–179)
POTASSIUM: 3.8 mmol/L (ref 3.4–4.8)
PROTEIN TOTAL: 7.6 g/dL (ref 5.7–8.2)
SODIUM: 141 mmol/L (ref 135–145)

## 2023-12-04 LAB — CBC W/ AUTO DIFF
BASOPHILS ABSOLUTE COUNT: 0 10*9/L (ref 0.0–0.1)
BASOPHILS RELATIVE PERCENT: 0.5 %
EOSINOPHILS ABSOLUTE COUNT: 0.2 10*9/L (ref 0.0–0.5)
EOSINOPHILS RELATIVE PERCENT: 2.9 %
HEMATOCRIT: 39.9 % (ref 39.0–48.0)
HEMOGLOBIN: 13.5 g/dL (ref 12.9–16.5)
LYMPHOCYTES ABSOLUTE COUNT: 1.1 10*9/L (ref 1.1–3.6)
LYMPHOCYTES RELATIVE PERCENT: 15.8 %
MEAN CORPUSCULAR HEMOGLOBIN CONC: 33.8 g/dL (ref 32.0–36.0)
MEAN CORPUSCULAR HEMOGLOBIN: 29.1 pg (ref 25.9–32.4)
MEAN CORPUSCULAR VOLUME: 86.2 fL (ref 77.6–95.7)
MEAN PLATELET VOLUME: 7.7 fL (ref 6.8–10.7)
MONOCYTES ABSOLUTE COUNT: 0.5 10*9/L (ref 0.3–0.8)
MONOCYTES RELATIVE PERCENT: 7.2 %
NEUTROPHILS ABSOLUTE COUNT: 5.2 10*9/L (ref 1.8–7.8)
NEUTROPHILS RELATIVE PERCENT: 73.6 %
NUCLEATED RED BLOOD CELLS: 0 /100{WBCs} (ref <=4)
PLATELET COUNT: 197 10*9/L (ref 150–450)
RED BLOOD CELL COUNT: 4.63 10*12/L (ref 4.26–5.60)
RED CELL DISTRIBUTION WIDTH: 14.7 % (ref 12.2–15.2)
WBC ADJUSTED: 7.1 10*9/L (ref 3.6–11.2)

## 2023-12-04 LAB — BILIRUBIN, DIRECT: BILIRUBIN DIRECT: 0.3 mg/dL (ref 0.00–0.30)

## 2023-12-04 LAB — PHOSPHORUS: PHOSPHORUS: 3.2 mg/dL (ref 2.4–5.1)

## 2023-12-04 LAB — MAGNESIUM: MAGNESIUM: 2.1 mg/dL (ref 1.6–2.6)

## 2023-12-04 LAB — GAMMA GT: GAMMA GLUTAMYL TRANSFERASE: 92 U/L — ABNORMAL HIGH (ref 0–73)

## 2023-12-04 MED ORDER — MOUNJARO 7.5 MG/0.5 ML SUBCUTANEOUS PEN INJECTOR
SUBCUTANEOUS | 2 refills | 0.00000 days | Status: CP
Start: 2023-12-04 — End: 2024-03-03
  Filled 2023-12-19: qty 2, 28d supply, fill #0

## 2023-12-04 MED ORDER — LANTHANUM 1,000 MG CHEWABLE TABLET
ORAL_TABLET | Freq: Three times a day (TID) | ORAL | 11 refills | 30.00000 days | Status: CP
Start: 2023-12-04 — End: 2024-12-03

## 2023-12-05 LAB — TACROLIMUS LEVEL: TACROLIMUS BLOOD: 3.6 ng/mL

## 2023-12-08 ENCOUNTER — Encounter (HOSPITAL_BASED_OUTPATIENT_CLINIC_OR_DEPARTMENT_OTHER): Payer: Self-pay

## 2023-12-08 ENCOUNTER — Emergency Department (HOSPITAL_BASED_OUTPATIENT_CLINIC_OR_DEPARTMENT_OTHER)

## 2023-12-08 ENCOUNTER — Other Ambulatory Visit: Payer: Self-pay

## 2023-12-08 ENCOUNTER — Emergency Department (HOSPITAL_BASED_OUTPATIENT_CLINIC_OR_DEPARTMENT_OTHER)
Admission: EM | Admit: 2023-12-08 | Discharge: 2023-12-08 | Disposition: A | Discharge status: Home / Self Care | Attending: Emergency Medicine | Admitting: Emergency Medicine

## 2023-12-08 DIAGNOSIS — Z7982 Long term (current) use of aspirin: Secondary | ICD-10-CM | POA: Insufficient documentation

## 2023-12-08 DIAGNOSIS — Z992 Dependence on renal dialysis: Secondary | ICD-10-CM | POA: Diagnosis not present

## 2023-12-08 DIAGNOSIS — N186 End stage renal disease: Secondary | ICD-10-CM | POA: Insufficient documentation

## 2023-12-08 DIAGNOSIS — I82431 Acute embolism and thrombosis of right popliteal vein: Secondary | ICD-10-CM | POA: Insufficient documentation

## 2023-12-08 DIAGNOSIS — M79661 Pain in right lower leg: Secondary | ICD-10-CM | POA: Diagnosis present

## 2023-12-08 LAB — COMPREHENSIVE METABOLIC PANEL WITH GFR
ALT: 18 U/L (ref 0–44)
AST: 27 U/L (ref 15–41)
Albumin: 3.8 g/dL (ref 3.5–5.0)
Alkaline Phosphatase: 153 U/L — ABNORMAL HIGH (ref 38–126)
Anion gap: 16 — ABNORMAL HIGH (ref 5–15)
BUN: 17 mg/dL (ref 6–20)
CO2: 26 mmol/L (ref 22–32)
Calcium: 9.9 mg/dL (ref 8.9–10.3)
Chloride: 95 mmol/L — ABNORMAL LOW (ref 98–111)
Creatinine, Ser: 5.34 mg/dL — ABNORMAL HIGH (ref 0.61–1.24)
GFR, Estimated: 12 mL/min — ABNORMAL LOW (ref >60)
Glucose, Bld: 172 mg/dL — ABNORMAL HIGH (ref 70–99)
Potassium: 3.9 mmol/L (ref 3.5–5.1)
Sodium: 137 mmol/L (ref 135–145)
Total Bilirubin: 0.6 mg/dL (ref 0.0–1.2)
Total Protein: 7.5 g/dL (ref 6.5–8.1)

## 2023-12-08 LAB — CBC WITH DIFFERENTIAL/PLATELET
Abs Immature Granulocytes: 0.01 10*3/uL (ref 0.00–0.07)
Basophils Absolute: 0 10*3/uL (ref 0.0–0.1)
Basophils Relative: 1 %
Eosinophils Absolute: 0.2 10*3/uL (ref 0.0–0.5)
Eosinophils Relative: 4 %
HCT: 40.7 % (ref 39.0–52.0)
Hemoglobin: 13.2 g/dL (ref 13.0–17.0)
Immature Granulocytes: 0 %
Lymphocytes Relative: 22 %
Lymphs Abs: 1.2 10*3/uL (ref 0.7–4.0)
MCH: 29 pg (ref 26.0–34.0)
MCHC: 32.4 g/dL (ref 30.0–36.0)
MCV: 89.5 fL (ref 80.0–100.0)
Monocytes Absolute: 0.4 10*3/uL (ref 0.1–1.0)
Monocytes Relative: 8 %
Neutro Abs: 3.8 10*3/uL (ref 1.7–7.7)
Neutrophils Relative %: 65 %
Platelets: 201 10*3/uL (ref 150–400)
RBC: 4.55 MIL/uL (ref 4.22–5.81)
RDW: 14.5 % (ref 11.5–15.5)
WBC: 5.8 10*3/uL (ref 4.0–10.5)
nRBC: 0 % (ref 0.0–0.2)

## 2023-12-08 MED ORDER — APIXABAN (ELIQUIS) VTE STARTER PACK (10MG AND 5MG): ORAL_TABLET | ORAL | 0 refills | Status: AC

## 2023-12-08 MED ORDER — APIXABAN (ELIQUIS) VTE STARTER PACK (10MG AND 5MG)
ORAL_TABLET | ORAL | 0 refills | Status: DC
Start: 2023-12-08 — End: 2023-12-08

## 2023-12-08 MED ORDER — OXYCODONE-ACETAMINOPHEN 5-325 MG PO TABS: 1.0000 | ORAL_TABLET | Freq: Four times a day (QID) | ORAL | 0 refills | Status: DC | PRN

## 2023-12-08 MED ORDER — OXYCODONE-ACETAMINOPHEN 5-325 MG PO TABS
1.0000 | ORAL_TABLET | Freq: Once | ORAL | Status: AC
  Administered 2023-12-08: 1 via ORAL
  Filled 2023-12-08: qty 1

## 2023-12-08 NOTE — Discharge Instructions (Addendum)
 You are seen in the emergency department today for concerns of leg pain.  Your ultrasound did have findings of a blood clot called a DVT in your right leg and what is called your popliteal vein which is located behind your knee close to your calf which is where your pain is located.  With this finding, I have started you on a course of blood thinners called Eliquis which she will take as prescribed.  I have also sent you a short course of pain medicine for the pain in this area.  I did also place a referral to the deep vein thrombosis clinic for further evaluation of your blood clot.  For any concerns of new or worsening symptoms, return to the emergency department.

## 2023-12-08 NOTE — ED Provider Notes (Signed)
 [place] EMERGENCY DEPARTMENT AT [facility] Provider Note   CSN: [id number] Arrival date & time: 12/08/23  1429     Patient presents with: Leg Pain (R)   Wayne Parker is a 58 y.o. male.  Patient with a history of ESRD on hemodialysis presents the emergency department with concerns of right leg pain.  He reports that he began noticing right calf pain starting Thursday that has been worsening since then.  He receives dialysis Tuesday, Thursday and Saturday and denies any correlation with his dialysis with symptom onset.  He reports that he spoke with his physician at his dialysis session today and advised him to seek evaluation for a possible blood clot.  He denies any redness, swelling, or any noticeable change in the appearance of the right leg compared to left.  No reported chest pain or shortness of breath.   Leg Pain      Prior to Admission medications   Medication Sig Start Date End Date Taking? Authorizing Provider  oxyCODONE -acetaminophen  (PERCOCET/ROXICET) 5-325 MG tablet Take 1 tablet by mouth every 6 (six) hours as needed for severe pain (pain score 7-10). 12/08/23  Yes Jennel Mara A, PA-C  APIXABAN (ELIQUIS) VTE STARTER PACK (10MG  AND 5MG ) Take as directed on package: start with two-5mg  tablets twice daily for 7 days. On day 8, switch to one-5mg  tablet twice daily. 12/08/23   Charizma Gardiner A, PA-C  aspirin  EC 81 MG EC tablet Take 1 tablet (81 mg total) by mouth daily. Swallow whole. 07/25/20   Magdalene School, MD  azithromycin  (ZITHROMAX ) 250 MG tablet Take zithromax  250 mg daily for 3 days. 07/25/20   Magdalene School, MD  carvedilol  (COREG ) 12.5 MG tablet Take 12.5 mg by mouth 2 (two) times daily with a meal.    [provider]  cholecalciferol  (VITAMIN D3) 25 MCG (1000 UNIT) tablet Take 1,000 Units by mouth daily.    [provider]  cloNIDine  (CATAPRES ) 0.1 MG tablet Take 0.1 mg by mouth at bedtime.    [provider]   dextromethorphan -guaiFENesin  (MUCINEX  DM) 30-600 MG 12hr tablet Take 1 tablet by mouth 2 (two) times daily as needed for cough. 07/24/20   Magdalene School, MD  furosemide  (LASIX ) 40 MG tablet Take 40-80 mg by mouth See admin instructions. Take 1 tablet (40 mg) daily. Take 2 tablets (80 mg) if you weigh >300 lbs    [provider]  hydrALAZINE  (APRESOLINE ) 25 MG tablet Take 25 mg by mouth 4 (four) times daily.    [provider]  insulin  aspart (NOVOLOG  FLEXPEN) 100 UNIT/ML FlexPen Inject 10-30 Units into the skin 3 (three) times daily with meals. Per sliding scale    [provider]  insulin  detemir (LEVEMIR ) 100 UNIT/ML injection Inject 12 Units into the skin at bedtime.    [provider]  isosorbide  mononitrate (IMDUR ) 30 MG 24 hr tablet Take 30 mg by mouth daily.    [provider]  magnesium  oxide (MAG-OX) 400 MG tablet Take 400 mg by mouth daily.    [provider]  Multiple Vitamin (MULTIVITAMIN WITH MINERALS) TABS tablet Take 1 tablet by mouth daily.    [provider]  mycophenolate  (MYFORTIC ) 180 MG EC tablet Take 180 mg by mouth 2 (two) times daily.    [provider]  tacrolimus  (PROGRAF ) 0.5 MG capsule Take 0.5 mg by mouth 2 (two) times daily.    [provider]  Testosterone  20.25 MG/ACT (1.62%) GEL Apply 1 application topically daily. Use 2  pumps once daily and apply to skin 06/22/15   [provider]    Allergies: Insulin  aspart prot & aspart    Review of Systems  Musculoskeletal:        Leg pain  All other systems reviewed and are negative.   Updated Vital Signs BP 115/66 (BP Location: Right Arm)   Pulse (!) 57   Temp 97.8 F (36.6 C)   Resp 16   SpO2 97%   Physical Exam Vitals and nursing note reviewed.  Constitutional:      General: He is not in acute distress.    Appearance: He is well-developed.  HENT:     Head: Normocephalic and atraumatic.   Eyes:      Conjunctiva/sclera: Conjunctivae normal.    Cardiovascular:     Rate and Rhythm: Normal rate and regular rhythm.     Pulses:          Dorsalis pedis pulses are 2+ on the right side and 2+ on the left side.       Posterior tibial pulses are 2+ on the right side and 2+ on the left side.     Heart sounds: No murmur heard. Pulmonary:     Effort: Pulmonary effort is normal. No respiratory distress.     Breath sounds: Normal breath sounds.  Abdominal:     Palpations: Abdomen is soft.     Tenderness: There is no abdominal tenderness.   Musculoskeletal:        General: Tenderness present. No swelling, deformity or signs of injury. Normal range of motion.     Cervical back: Neck supple.     Comments: TTP to the posterior right calf. No leg swelling or discoloration present. Left leg unremarkable.   Skin:    General: Skin is warm and dry.     Capillary Refill: Capillary refill takes less than 2 seconds.   Neurological:     Mental Status: He is alert.   Psychiatric:        Mood and Affect: Mood normal.     (all labs ordered are listed, but only abnormal results are displayed) Labs Reviewed  COMPREHENSIVE METABOLIC PANEL WITH GFR - Abnormal; Notable for the following components:      Result Value   Chloride 95 (*)    Glucose, Bld 172 (*)    Creatinine, Ser 5.34 (*)    Alkaline Phosphatase 153 (*)    GFR, Estimated 12 (*)    Anion gap 16 (*)    All other components within normal limits  CBC WITH DIFFERENTIAL/PLATELET    EKG: None  Radiology: US  Venous Img Lower Unilateral Right Result Date: 12/08/2023 CLINICAL DATA:  Right calf pain for 3 days. Evaluate for deep venous thrombosis. EXAM: RIGHT LOWER EXTREMITY VENOUS DOPPLER ULTRASOUND TECHNIQUE: Gray-scale sonography with graded compression, as well as color Doppler and duplex ultrasound were performed to evaluate the lower extremity deep venous systems from the level of the common femoral vein and including the common femoral,  femoral, profunda femoral, popliteal and calf veins including the posterior tibial, peroneal and gastrocnemius veins when visible. The superficial great saphenous vein was also interrogated. Spectral Doppler was utilized to evaluate flow at rest and with distal augmentation maneuvers in the common femoral, femoral and popliteal veins. COMPARISON:  None Available. FINDINGS: Contralateral Common Femoral Vein: Respiratory phasicity is normal and symmetric with the symptomatic side. No evidence of thrombus. Normal compressibility. Common Femoral Vein: No evidence of thrombus. Normal compressibility, respiratory phasicity and response  to augmentation. Saphenofemoral Junction: No evidence of thrombus. Normal compressibility and flow on color Doppler imaging. Profunda Femoral Vein: No evidence of thrombus. Normal compressibility and flow on color Doppler imaging. Femoral Vein: No evidence of thrombus. Normal compressibility, respiratory phasicity and response to augmentation. Popliteal Vein: The distal portion of the right popliteal vein is noncompressible. There is internal echogenicity within the vein in this region. No color flow. Calf Veins: Limited evaluation due to large body habitus. No definite calf pain thrombosis is seen. Superficial Great Saphenous Vein: No evidence of thrombus. Normal compressibility. Venous Reflux:  None. Other Findings:  None. IMPRESSION: Acute, likely short segment high-grade partial to possibly occlusive deep venous thrombosis in the distal portion of the right popliteal vein. Critical Value/emergent results were called by telephone at the time of interpretation on 12/08/2023 at 6:50 pm to provider Abby Tucholski, PA-C, who verbally acknowledged these results. Electronically Signed   By: Bertina Broccoli M.D.   On: 12/08/2023 18:52    Procedures   Medications Ordered in the ED  oxyCODONE -acetaminophen  (PERCOCET/ROXICET) 5-325 MG per tablet 1 tablet (1 tablet Oral Given 12/08/23 1745)                                   Medical Decision Making Amount and/or Complexity of Data Reviewed Labs: ordered.  Risk Prescription drug management.   This patient presents to the ED for concern of leg pain.  Differential diagnosis includes cellulitis, DVT, ischemic limb, claudication   Lab Tests:  I Ordered, and personally interpreted labs.  The pertinent results include: CBC unremarkable, CMP with evident renal dysfunction but given patient is on hemodialysis, unremarkable   Imaging Studies ordered:  I ordered imaging studies including ultrasound of the left lower extremity I independently visualized and interpreted imaging which showed acute, likely short segment high-grade partial to possibly occlusive deep venous thrombosis in the distal portion of the right popliteal vein. I agree with the radiologist interpretation   Medicines ordered and prescription drug management:  I ordered medication including Percocet for pain Reevaluation of the patient after these medicines showed that the patient improved I have reviewed the patients home medicines and have made adjustments as needed   Problem List / ED Course:  Patient with past history significant for ESRD on hemodialysis presents to the ED with concerns of right leg pain.  Reports onset of right calf pain starting 2 days prior without significant changes or improvement.  Does report that the pain has somewhat worsened and has noted some pain with ambulation.  Denies any prior history of PE or DVT.  Not on blood thinners.  Receives dialysis on T/TH/S. On exam, patient has calf tenderness to the posterior right calf.  Left leg is unremarkable.  No appreciable erythema and induration of any of the surrounding skin of the right leg.  There is no edema present in either leg.  Doubtful of fluid overload.  Based on area of pain, biggest concern is for possible DVT.  Palpable pulses to the right lower extremity at the [facility] and PT sites  making ischemic limb unlikely.  Will obtain ultrasound imaging as well as basic labs for evaluation of patient's pain. CBC unremarkable, CMP with renal dysfunction consistent with baseline ESRD.  Ultrasound of the right lower extremity is consistent with DVT at the right popliteal vein.  As patient is not currently anticoagulated, spoke with pharmacist regarding anticoagulation selection given patient's ESRD.  Recommendations for Eliquis.  Started patient on Eliquis starter pack with instructions for follow-up with DVT clinic.  Patient verbalized understanding current treatment plan and is planning to pick up his prescription of Eliquis this evening. No other acute or focal concerns at this time. Discharged home with DVT referral placed and return precautions discussed.   Social Determinants of Health:  ESRD on HD  Final diagnoses:  Acute deep vein thrombosis (DVT) of popliteal vein of right lower extremity [facility])    ED Discharge Orders          Ordered    APIXABAN (ELIQUIS) VTE STARTER PACK (10MG  AND 5MG )  Status:  Discontinued       Note to Pharmacy: If starter pack unavailable, substitute with seventy-four 5 mg apixaban tabs following the above SIG directions.   12/08/23 1949    AMB Referral to Deep Vein Thrombosis Clinic        12/08/23 1949    oxyCODONE -acetaminophen  (PERCOCET/ROXICET) 5-325 MG tablet  Every 6 hours PRN        12/08/23 1949    APIXABAN (ELIQUIS) VTE STARTER PACK (10MG  AND 5MG )       Note to Pharmacy: If starter pack unavailable, substitute with seventy-four 5 mg apixaban tabs following the above SIG directions.   12/08/23 2050               Concetta Dee, PA-C 12/09/23 0022    Ninetta Basket, MD 12/11/23 [phone]

## 2023-12-08 NOTE — ED Triage Notes (Signed)
 Pt c/o R calf pain described as outrageous, onset Thursday, worsening since. Dialysis today- full session, told doctor about it, she thinks it might be a blood clot.  No redness, no swelling, not bigger.

## 2023-12-10 DIAGNOSIS — Z5181 Encounter for therapeutic drug level monitoring: Principal | ICD-10-CM

## 2023-12-10 DIAGNOSIS — Z944 Liver transplant status: Principal | ICD-10-CM

## 2023-12-10 DIAGNOSIS — E612 Magnesium deficiency: Principal | ICD-10-CM

## 2023-12-14 NOTE — Progress Notes (Deleted)
 DVT Clinic Note  Name: Wayne Parker     MRN: [id number]     DOB: [birth year]     Sex: male  PCP: Jama Selinda BRAVO, MD  Today's Visit:    Referred to DVT Clinic by: Emergency Department - Legrand Angle, PA Referred to CPP by: {CPP Referral Provider:[id number]} Reason for referral: No chief complaint on file.  HISTORY OF PRESENT ILLNESS: Wayne Parker is a 58 y.o. male with PMH ESRD on HD TTS, HTN, HFpEF, T2DM, s/p liver transplant, who presents for follow up medication management after diagnosis of DVT. Presented to the ED on 12/08/23 reporting two days of right calf pain. Found to have right popliteal DVT and was started on Eliquis . He was seen in the ED 12/16/23 after syncope and a fall the day prior, CT head unremarkable. Patient was noted to be in afib at that time.   Today, patient reports ***. *** abnormal bleeding or bruising. *** missed doses of ***. *** wearing compression stockings.           Rx Insurance Coverage: {Insurance Ubez:71610} Rx Affordability: *** Rx Assistance Provided: {Rx Assistance Provided:[id number]} Preferred Pharmacy: ***  Past Medical History:  Diagnosis Date   Chronic kidney disease    Cirrhosis (HCC)    Heart murmur    Hypertension    Sepsis (HCC)    Sleep apnea     Past Surgical History:  Procedure Laterality Date   LIVER TRANSPLANT     NO PAST SURGERIES      Social History   Socioeconomic History   Marital status: Single    Spouse name: Not on file   Number of children: Not on file   Years of education: Not on file   Highest education level: Not on file  Occupational History   Not on file  Tobacco Use   Smoking status: Never   Smokeless tobacco: Never   Tobacco comments:    quit smoking 2002  Vaping Use   Vaping status: Never Used  Substance and Sexual Activity   Alcohol use: No   Drug use: Not on file   Sexual activity: Not Currently  Other Topics Concern   Not on file  Social History Narrative   Not on file   Social Drivers of  Health   Financial Resource Strain: Low Risk  (11/17/2022)   Received from [facility]   Overall Financial Resource Strain (CARDIA)    Difficulty of Paying Living Expenses: Not very hard  Food Insecurity: Patient Declined (10/01/2023)   Received from [facility]   Hunger Vital Sign    Within the past 12 months, you worried that your food would run out before you got the money to buy more.: Patient declined    Within the past 12 months, the food you bought just didn't last and you didn't have money to get more.: Patient declined  Transportation Needs: Patient Declined (10/01/2023)   Received from [facility] - Transportation    Lack of Transportation (Medical): Patient declined    Lack of Transportation (Non-Medical): Patient declined  Physical Activity: Insufficiently Active (07/29/2020)   Received from [facility] visits prior to 08/26/2022.   Exercise Vital Sign    On average, how many days per week do you engage in moderate to strenuous exercise (like a brisk walk)?: 2 days    On average, how many minutes do you engage in exercise at this level?: 20 min  Stress: No Stress  Concern Present (07/29/2020)   Received from [facility] visits prior to 08/26/2022.   [employer] of Occupational Health - Occupational Stress Questionnaire    Feeling of Stress : Not at all  Social Connections: Unknown (07/10/2022)   Received from [facility]   Social Network    Social Network: Not on file  Intimate Partner Violence: Unknown (07/10/2022)   Received from [facility]   HITS    Physically Hurt: Not on file    Insult or Talk Down To: Not on file    Threaten Physical Harm: Not on file    Scream or Curse: Not on file    No family history on file.  Allergies as of 12/18/2023 - Review Complete 12/08/2023  Allergen Reaction Noted   Insulin  aspart prot & aspart Other (See Comments) 07/09/2015    Current Outpatient Medications on File  Prior to Visit  Medication Sig Dispense Refill   APIXABAN  (ELIQUIS ) VTE STARTER PACK (10MG  AND 5MG ) Take as directed on package: start with two-5mg  tablets twice daily for 7 days. On day 8, switch to one-5mg  tablet twice daily. 74 each 0   aspirin  EC 81 MG EC tablet Take 1 tablet (81 mg total) by mouth daily. Swallow whole. 30 tablet 11   azithromycin  (ZITHROMAX ) 250 MG tablet Take zithromax  250 mg daily for 3 days. 6 each 0   carvedilol  (COREG ) 12.5 MG tablet Take 12.5 mg by mouth 2 (two) times daily with a meal.     cholecalciferol  (VITAMIN D3) 25 MCG (1000 UNIT) tablet Take 1,000 Units by mouth daily.     cloNIDine  (CATAPRES ) 0.1 MG tablet Take 0.1 mg by mouth at bedtime.     dextromethorphan -guaiFENesin  (MUCINEX  DM) 30-600 MG 12hr tablet Take 1 tablet by mouth 2 (two) times daily as needed for cough. 12 tablet 0   furosemide  (LASIX ) 40 MG tablet Take 40-80 mg by mouth See admin instructions. Take 1 tablet (40 mg) daily. Take 2 tablets (80 mg) if you weigh >300 lbs     hydrALAZINE  (APRESOLINE ) 25 MG tablet Take 25 mg by mouth 4 (four) times daily.     insulin  aspart (NOVOLOG  FLEXPEN) 100 UNIT/ML FlexPen Inject 10-30 Units into the skin 3 (three) times daily with meals. Per sliding scale     insulin  detemir (LEVEMIR ) 100 UNIT/ML injection Inject 12 Units into the skin at bedtime.     isosorbide  mononitrate (IMDUR ) 30 MG 24 hr tablet Take 30 mg by mouth daily.     magnesium  oxide (MAG-OX) 400 MG tablet Take 400 mg by mouth daily.     Multiple Vitamin (MULTIVITAMIN WITH MINERALS) TABS tablet Take 1 tablet by mouth daily.     mycophenolate  (MYFORTIC ) 180 MG EC tablet Take 180 mg by mouth 2 (two) times daily.     oxyCODONE -acetaminophen  (PERCOCET/ROXICET) 5-325 MG tablet Take 1 tablet by mouth every 6 (six) hours as needed for severe pain (pain score 7-10). 8 tablet 0   tacrolimus  (PROGRAF ) 0.5 MG capsule Take 0.5 mg by mouth 2 (two) times daily.     Testosterone  20.25 MG/ACT (1.62%) GEL Apply 1  application topically daily. Use 2 pumps once daily and apply to skin     No current facility-administered medications on file prior to visit.   REVIEW OF SYSTEMS:  ROS PHYSICAL EXAMINATION:  There were no vitals filed for this visit.  There is no height or weight on file to calculate BMI.  Physical Exam Villalta Score for Post-Thrombotic Syndrome:  LABS:  CBC     Component Value Date/Time   WBC 5.8 12/08/2023 1500   RBC 4.55 12/08/2023 1500   HGB 13.2 12/08/2023 1500   HCT 40.7 12/08/2023 1500   PLT 201 12/08/2023 1500   MCV 89.5 12/08/2023 1500   MCH 29.0 12/08/2023 1500   MCHC 32.4 12/08/2023 1500   RDW 14.5 12/08/2023 1500   LYMPHSABS 1.2 12/08/2023 1500   MONOABS 0.4 12/08/2023 1500   EOSABS 0.2 12/08/2023 1500   BASOSABS 0.0 12/08/2023 1500    Hepatic Function      Component Value Date/Time   PROT 7.5 12/08/2023 1500   ALBUMIN 3.8 12/08/2023 1500   AST 27 12/08/2023 1500   ALT 18 12/08/2023 1500   ALKPHOS 153 (H) 12/08/2023 1500   BILITOT 0.6 12/08/2023 1500    Renal Function   Lab Results  Component Value Date   CREATININE 5.34 (H) 12/08/2023   CREATININE 2.63 (H) 07/24/2020   CREATININE 2.59 (H) 07/23/2020    CrCl cannot be calculated (Unknown ideal weight.).   Imaging Studies:  12/14/23 doppler FINDINGS: Contralateral Common Femoral Vein: Respiratory phasicity is normal and symmetric with the symptomatic side. No evidence of thrombus. Normal compressibility.   Common Femoral Vein: No evidence of thrombus. Normal compressibility, respiratory phasicity and response to augmentation.   Saphenofemoral Junction: No evidence of thrombus. Normal compressibility and flow on color Doppler imaging.   Profunda Femoral Vein: No evidence of thrombus. Normal compressibility and flow on color Doppler imaging.   Femoral Vein: No evidence of thrombus. Normal compressibility, respiratory phasicity and response to augmentation.   Popliteal Vein: The distal  portion of the right popliteal vein is noncompressible. There is internal echogenicity within the vein in this region. No color flow.   Calf Veins: Limited evaluation due to large body habitus. No definite calf pain thrombosis is seen.   Superficial Great Saphenous Vein: No evidence of thrombus. Normal compressibility.   Venous Reflux:  None.   Other Findings:  None.   IMPRESSION: Acute, likely short segment high-grade partial to possibly occlusive deep venous thrombosis in the distal portion of the right popliteal vein.  ASSESSMENT:    Patient without prior history of DVT diagnosed with an acute right popliteal vein DVT. ***  Patient was found to be in afib in his most recent ED visit 12/16/23. Upon chart review, see that afib was noted at 11/09/23 office visit at [facility] PCP [facility]). Appears afib first noted during 10/2022 hospitalization at [facility]. The note from PCP visit says they started Eliquis  for CHA2DS2-VASc of 3, and per dispense history this was filled 11/20/23.   PLAN: {DVT Clinic Eojw:71604}  Follow up: ***  Lum Herald, PharmD, BCACP, CPP Deep Vein Thrombosis Clinic Clinical Pharmacist Practitioner [phone]

## 2023-12-16 ENCOUNTER — Emergency Department (HOSPITAL_BASED_OUTPATIENT_CLINIC_OR_DEPARTMENT_OTHER)

## 2023-12-16 ENCOUNTER — Emergency Department (HOSPITAL_BASED_OUTPATIENT_CLINIC_OR_DEPARTMENT_OTHER): Admitting: Radiology

## 2023-12-16 ENCOUNTER — Other Ambulatory Visit: Payer: Self-pay

## 2023-12-16 ENCOUNTER — Emergency Department (HOSPITAL_BASED_OUTPATIENT_CLINIC_OR_DEPARTMENT_OTHER): Admission: EM | Admit: 2023-12-16 | Discharge: 2023-12-17 | Disposition: A | Discharge status: Home / Self Care

## 2023-12-16 DIAGNOSIS — N186 End stage renal disease: Secondary | ICD-10-CM | POA: Diagnosis not present

## 2023-12-16 DIAGNOSIS — Z7901 Long term (current) use of anticoagulants: Secondary | ICD-10-CM | POA: Diagnosis not present

## 2023-12-16 DIAGNOSIS — R55 Syncope and collapse: Secondary | ICD-10-CM | POA: Insufficient documentation

## 2023-12-16 DIAGNOSIS — Z992 Dependence on renal dialysis: Secondary | ICD-10-CM | POA: Insufficient documentation

## 2023-12-16 DIAGNOSIS — Z7982 Long term (current) use of aspirin: Secondary | ICD-10-CM | POA: Insufficient documentation

## 2023-12-16 DIAGNOSIS — Z79899 Other long term (current) drug therapy: Secondary | ICD-10-CM | POA: Insufficient documentation

## 2023-12-16 DIAGNOSIS — Z794 Long term (current) use of insulin: Secondary | ICD-10-CM | POA: Insufficient documentation

## 2023-12-16 DIAGNOSIS — I120 Hypertensive chronic kidney disease with stage 5 chronic kidney disease or end stage renal disease: Secondary | ICD-10-CM | POA: Diagnosis not present

## 2023-12-16 LAB — COMPREHENSIVE METABOLIC PANEL WITH GFR
ALT: 129 U/L — ABNORMAL HIGH (ref 0–44)
AST: 182 U/L — ABNORMAL HIGH (ref 15–41)
Albumin: 3.8 g/dL (ref 3.5–5.0)
Alkaline Phosphatase: 441 U/L — ABNORMAL HIGH (ref 38–126)
Anion gap: 17 — ABNORMAL HIGH (ref 5–15)
BUN: 22 mg/dL — ABNORMAL HIGH (ref 6–20)
CO2: 22 mmol/L (ref 22–32)
Calcium: 10.5 mg/dL — ABNORMAL HIGH (ref 8.9–10.3)
Chloride: 98 mmol/L (ref 98–111)
Creatinine, Ser: 8.04 mg/dL — ABNORMAL HIGH (ref 0.61–1.24)
GFR, Estimated: 7 mL/min — ABNORMAL LOW (ref >60)
Glucose, Bld: 189 mg/dL — ABNORMAL HIGH (ref 70–99)
Potassium: 4.9 mmol/L (ref 3.5–5.1)
Sodium: 137 mmol/L (ref 135–145)
Total Bilirubin: 0.9 mg/dL (ref 0.0–1.2)
Total Protein: 7.8 g/dL (ref 6.5–8.1)

## 2023-12-16 LAB — CBC
HCT: 44.4 % (ref 39.0–52.0)
Hemoglobin: 14.6 g/dL (ref 13.0–17.0)
MCH: 29.9 pg (ref 26.0–34.0)
MCHC: 32.9 g/dL (ref 30.0–36.0)
MCV: 90.8 fL (ref 80.0–100.0)
Platelets: 265 10*3/uL (ref 150–400)
RBC: 4.89 MIL/uL (ref 4.22–5.81)
RDW: 14.9 % (ref 11.5–15.5)
WBC: 7.2 10*3/uL (ref 4.0–10.5)
nRBC: 0 % (ref 0.0–0.2)

## 2023-12-16 LAB — MAGNESIUM: Magnesium: 2.4 mg/dL (ref 1.7–2.4)

## 2023-12-16 LAB — TROPONIN T, HIGH SENSITIVITY: Troponin T High Sensitivity: 73 ng/L — ABNORMAL HIGH (ref <19)

## 2023-12-16 LAB — CBG MONITORING, ED: Glucose-Capillary: 232 mg/dL — ABNORMAL HIGH (ref 70–99)

## 2023-12-16 MED ORDER — OXYCODONE-ACETAMINOPHEN 5-325 MG PO TABS
1.0000 | ORAL_TABLET | Freq: Once | ORAL | Status: AC
  Administered 2023-12-16: 1 via ORAL
  Filled 2023-12-16: qty 1

## 2023-12-16 MED ORDER — HYDROMORPHONE HCL 1 MG/ML IJ SOLN
0.5000 mg | Freq: Once | INTRAMUSCULAR | Status: AC
  Administered 2023-12-16: 0.5 mg via INTRAVENOUS
  Filled 2023-12-16: qty 1

## 2023-12-16 NOTE — ED Provider Notes (Incomplete)
 [name] Provider Note   CSN: [id number] Arrival date & time: 12/16/23  2155     Patient presents with: Loss of Consciousness   Wayne Parker is a 58 y.o. male.    Loss of Consciousness Patient is a 58 year old male presented the ED today with complaints of syncope that happened approximately 6 hours ago, noting to have been getting out of his car when he started feeling faint where he then woke up on the ground, with a bystander helping him stand up.  He speculates that he was only out for a couple of seconds.  Medical history of DVT currently anticoagulated on Eliquis , liver transplant, hemodialysis Tuesday Thursday Saturday with having completed his last dialysis yesterday, atrial fibrillation.   States that he went home after the fall and took a nap and came in today due to his right toes hurting, noting that he is having extreme pain over his 1st through 4th toes.  Denies fever, headache, vision changes, chest pain, shortness of breath, abdominal pain, nausea, vomiting, diarrhea, body aches, chills, dysuria, hematochezia, melena.  Prior to Admission medications   Medication Sig Start Date End Date Taking? Authorizing Provider  APIXABAN  (ELIQUIS ) VTE STARTER PACK (10MG  AND 5MG ) Take as directed on package: start with two-5mg  tablets twice daily for 7 days. On day 8, switch to one-5mg  tablet twice daily. 12/08/23   Zelaya, Oscar A, PA-C  aspirin  EC 81 MG EC tablet Take 1 tablet (81 mg total) by mouth daily. Swallow whole. 07/25/20   Leotis Bogus, MD  azithromycin  (ZITHROMAX ) 250 MG tablet Take zithromax  250 mg daily for 3 days. 07/25/20   Leotis Bogus, MD  carvedilol  (COREG ) 12.5 MG tablet Take 12.5 mg by mouth 2 (two) times daily with a meal.    [provider]  cholecalciferol  (VITAMIN D3) 25 MCG (1000 UNIT) tablet Take 1,000 Units by mouth daily.    [provider]  cloNIDine  (CATAPRES ) 0.1 MG tablet Take  0.1 mg by mouth at bedtime.    [provider]  dextromethorphan -guaiFENesin  (MUCINEX  DM) 30-600 MG 12hr tablet Take 1 tablet by mouth 2 (two) times daily as needed for cough. 07/24/20   Leotis Bogus, MD  furosemide  (LASIX ) 40 MG tablet Take 40-80 mg by mouth See admin instructions. Take 1 tablet (40 mg) daily. Take 2 tablets (80 mg) if you weigh >300 lbs    [provider]  hydrALAZINE  (APRESOLINE ) 25 MG tablet Take 25 mg by mouth 4 (four) times daily.    [provider]  insulin  aspart (NOVOLOG  FLEXPEN) 100 UNIT/ML FlexPen Inject 10-30 Units into the skin 3 (three) times daily with meals. Per sliding scale    [provider]  insulin  detemir (LEVEMIR ) 100 UNIT/ML injection Inject 12 Units into the skin at bedtime.    [provider]  isosorbide  mononitrate (IMDUR ) 30 MG 24 hr tablet Take 30 mg by mouth daily.    [provider]  magnesium  oxide (MAG-OX) 400 MG tablet Take 400 mg by mouth daily.    [provider]  Multiple Vitamin (MULTIVITAMIN WITH MINERALS) TABS tablet Take 1 tablet by mouth daily.    [provider]  mycophenolate  (MYFORTIC ) 180 MG EC tablet Take 180 mg by mouth 2 (two) times daily.    [provider]  oxyCODONE -acetaminophen  (PERCOCET/ROXICET) 5-325 MG tablet Take 1 tablet by mouth every 6 (six) hours as needed for severe pain (pain score 7-10). 12/08/23   Zelaya, Oscar A, PA-C  tacrolimus  (PROGRAF ) 0.5 MG capsule Take 0.5 mg by mouth 2 (two) times daily.    [provider]  Testosterone  20.25 MG/ACT (1.62%) GEL Apply 1 application topically daily. Use 2 pumps once daily and apply to skin 06/22/15   [provider]    Allergies: Insulin  aspart prot & aspart    Review of Systems  Cardiovascular:  Positive for syncope.  Musculoskeletal:  Positive for arthralgias.  Neurological:  Positive for syncope.  All other systems reviewed and are negative.   Updated Vital Signs Temp  98.6 F (37 C) (Oral)   Ht 5' 9 (1.753 m)   Wt 117.9 kg   BMI 38.40 kg/m   Physical Exam Vitals and nursing note reviewed.  Constitutional:      General: He is not in acute distress.    Appearance: Normal appearance. He is obese. He is not ill-appearing.  HENT:     Head: Normocephalic and atraumatic.     Nose: No congestion.     Mouth/Throat:     Mouth: Mucous membranes are moist.     Pharynx: Oropharynx is clear. No oropharyngeal exudate or posterior oropharyngeal erythema.   Eyes:     General: No scleral icterus.       Right eye: No discharge.     Extraocular Movements: Extraocular movements intact.     Conjunctiva/sclera: Conjunctivae normal.     Pupils: Pupils are equal, round, and reactive to light.    Cardiovascular:     Rate and Rhythm: Normal rate and regular rhythm.     Pulses: Normal pulses.     Heart sounds: Normal heart sounds. No murmur heard.    No friction rub. No gallop.  Pulmonary:     Effort: Pulmonary effort is normal. No respiratory distress.     Breath sounds: Normal breath sounds. No stridor. No wheezing, rhonchi or rales.  Abdominal:     General: Abdomen is flat. There is no distension.     Palpations: Abdomen is soft.     Tenderness: There is no abdominal tenderness. There is no guarding or rebound.   Musculoskeletal:        General: Tenderness (Tenderness noted to the 1st through 4th toes on right foot) present. No swelling, deformity or signs of injury.     Cervical back: Normal range of motion and neck supple. No rigidity or tenderness.   Skin:    General: Skin is warm and dry.     Capillary Refill: Capillary refill takes less than 2 seconds.     Findings: No bruising, erythema, lesion or rash.     Comments: DP pulse 2+ bilaterally   Neurological:     General: No focal deficit present.     Mental Status: He is alert and oriented to person, place, and time. Mental status is at baseline.     Cranial Nerves: No cranial nerve deficit.      Sensory: No sensory deficit.     Motor: No weakness.     Coordination: Coordination normal.   Psychiatric:        Mood and Affect: Mood normal.    (all labs ordered are listed, but only abnormal results are displayed) Labs Reviewed  COMPREHENSIVE METABOLIC PANEL WITH GFR  CBC  URINALYSIS, ROUTINE W REFLEX MICROSCOPIC  CBG MONITORING, ED    EKG: None  Radiology: No results found.   Procedures   Medications Ordered in the ED - No data to display    {Click here for ABCD2, HEART  and other calculators REFRESH Note before signing:1}                              Medical Decision Making Amount and/or Complexity of Data Reviewed Labs: ordered.   This patient is a ***  who presents to the ED for concern of ***.   Differential diagnoses prior to evaluation: The emergent differential diagnosis includes, but is not limited to,  *** . This is not an exhaustive differential.   Past Medical History / Co-morbidities / Social History: Previous medical history of HTN, cirrhosis, heart murmur, sepsis, CKD currently on hemodialysis, sleep apnea, liver transplant, atrial fibrillation  Additional history: Chart reviewed. Pertinent results include: Seen on 12/08/2023 for acute DVT, started on Eliquis .  Discharged with referral in place.  Provided Percocet at that time.  Lab Tests/Imaging studies: I personally interpreted labs/imaging and the pertinent results include:  ***.   ***I agree with the radiologist interpretation.  Cardiac monitoring: EKG obtained and interpreted by myself and attending physician which shows: ***   Medications: I ordered medication including ***.  I have reviewed the patients home medicines and have made adjustments as needed.  Critical Interventions:  Social Determinants of Health:  Disposition: After consideration of the diagnostic results and the patients response to treatment, I feel that the patient would benefit from ***.   ***emergency  department workup does not suggest an emergent condition requiring admission or immediate intervention beyond what has been performed at this time. The plan is: ***. The patient is safe for discharge and has been instructed to return immediately for worsening symptoms, change in symptoms or any other concerns.   {Document critical care time when appropriate  Document review of labs and clinical decision tools ie CHADS2VASC2, etc  Document your independent review of radiology images and any outside records  Document your discussion with family members, caretakers and with consultants  Document social determinants of health affecting pt's care  Document your decision making why or why not admission, treatments were needed:32947:::1}   Final diagnoses:  None    ED Discharge Orders     None

## 2023-12-16 NOTE — ED Notes (Signed)
 Answered call light, pt states he does not feel well and ask for something for pain. Advised pt we are waiting on the doctor.

## 2023-12-16 NOTE — ED Notes (Addendum)
 Advised pt we need a urine sample, pt advised he does not urinate states he is in end stages of renal failure, states does not have a catheter

## 2023-12-16 NOTE — ED Notes (Signed)
 Pt states he is anuric and will not be able to provide UA

## 2023-12-16 NOTE — ED Triage Notes (Addendum)
 Pt POV reporting syncopal episode around 4pm, was getting out of car and remembers being shaky, passed out, bystanders helped him back into car. Now reporting R foot pain, hurts to ambulate, and nausea. Diabetic, +blood thinners, does not know if he hit head.

## 2023-12-16 NOTE — ED Notes (Signed)
 Fall risk bundle is currently in place.

## 2023-12-17 DIAGNOSIS — Z944 Liver transplant status: Principal | ICD-10-CM

## 2023-12-17 DIAGNOSIS — Z794 Long term (current) use of insulin: Principal | ICD-10-CM

## 2023-12-17 DIAGNOSIS — Z5181 Encounter for therapeutic drug level monitoring: Principal | ICD-10-CM

## 2023-12-17 DIAGNOSIS — E1165 Type 2 diabetes mellitus with hyperglycemia: Principal | ICD-10-CM

## 2023-12-17 DIAGNOSIS — E612 Magnesium deficiency: Principal | ICD-10-CM

## 2023-12-17 LAB — ETHANOL: Alcohol, Ethyl (B): <15 mg/dL (ref <15)

## 2023-12-17 LAB — TROPONIN T, HIGH SENSITIVITY: Troponin T High Sensitivity: 72 ng/L — ABNORMAL HIGH (ref <19)

## 2023-12-17 LAB — LACTIC ACID, PLASMA: Lactic Acid, Venous: 2.4 mmol/L (ref 0.5–1.9)

## 2023-12-17 MED ORDER — HYDROMORPHONE HCL 1 MG/ML IJ SOLN
1.0000 mg | Freq: Once | INTRAMUSCULAR | Status: AC
  Administered 2023-12-17: 1 mg via INTRAVENOUS
  Filled 2023-12-17: qty 1

## 2023-12-17 MED ORDER — OXYCODONE HCL 5 MG PO TABS: 5.0000 mg | ORAL_TABLET | Freq: Four times a day (QID) | ORAL | 0 refills | Status: DC | PRN

## 2023-12-17 MED ORDER — LACTATED RINGERS IV BOLUS
500.0000 mL | Freq: Once | INTRAVENOUS | Status: AC
  Administered 2023-12-17: 500 mL via INTRAVENOUS

## 2023-12-17 MED ORDER — PREDNISONE 50 MG PO TABS
50.0000 mg | ORAL_TABLET | Freq: Once | ORAL | Status: AC
  Administered 2023-12-17: 50 mg via ORAL

## 2023-12-17 MED ORDER — PEN NEEDLE, DIABETIC 32 GAUGE X 5/32" (4 MM)
0 refills | 0.00000 days
Start: 2023-12-17 — End: ?

## 2023-12-17 MED ORDER — INSULIN ASPART (U-100) 100 UNIT/ML (3 ML) SUBCUTANEOUS PEN
Freq: Three times a day (TID) | SUBCUTANEOUS | 0 refills | 57.00000 days
Start: 2023-12-17 — End: ?

## 2023-12-17 NOTE — Unmapped (Addendum)
 [facility] Refill Coordination Note    Specialty Medication(s) to be Shipped:   Transplant: mycophenolate  mofetil 250mg  and tacrolimus  1mg     Other medication(s) to be shipped: gabapentin  ,pen needles,novolog ,lanthanum  1000 MG chewable tablet, Mounjaro      Richard [name], DOB: 1966/04/23  Phone: There are no phone numbers on file.      All above HIPAA information was verified with patient.     Was a Nurse, learning disability used for this call? No    Completed refill call assessment today to schedule patient's medication shipment from the [facility]  [phone]).  All relevant notes have been reviewed.     Specialty medication(s) and dose(s) confirmed: Regimen is correct and unchanged.   Changes to medications: Ian reports no changes at this time.  Changes to insurance: No  New side effects reported not previously addressed with a pharmacist or physician: None reported  Questions for the pharmacist: No    Confirmed patient received a Conservation officer, historic buildings and a Surveyor, mining with first shipment. The patient will receive a drug information handout for each medication shipped and additional FDA Medication Guides as required.       DISEASE/MEDICATION-SPECIFIC INFORMATION        N/A    SPECIALTY MEDICATION ADHERENCE     Medication Adherence    Patient reported X missed doses in the last month: 0  Specialty Medication: tacrolimus : PROGRAF  0.5 mg capsule  Patient is on additional specialty medications: Yes  Additional Specialty Medications: mycophenolate  250 mg capsule (CELLCEPT )  Patient Reported Additional Medication X Missed Doses in the Last Month: 0  Patient is on more than two specialty medications: No  Any gaps in refill history greater than 2 weeks in the last 3 months: no  Demonstrates understanding of importance of adherence: yes  Informant: patient  Reliability of informant: reliable  Provider-estimated medication adherence level: good  Patient is at risk for Non-Adherence: No  Reasons for non-adherence: no problems identified  Adherence tools used: patient uses a pill box to manage medications  Confirmed plan for next specialty medication refill: delivery by pharmacy  Refills needed for supportive medications: not needed          Refill Coordination    Has the Patients' Contact Information Changed: No  Is the Shipping Address Different: No         Were doses missed due to medication being on hold? No    Mycophenolate   250 mg: 5 days of medicine on hand   tacrolimus  0.5 mg: 5 days of medicine on hand       REFERRAL TO PHARMACIST     Referral to the pharmacist: Not needed      [facility]     Shipping address confirmed in Epic.     Cost and Payment: Patient has a $0 copay, payment information is not required.    Delivery Scheduled: Yes, Expected medication delivery date: 06/26.     Medication will be delivered via UPS to the temporary address in Epic WAM.    [facility]  Specialty Technician

## 2023-12-17 NOTE — ED Notes (Addendum)
 Answered call light, pt states phillips monitor is beeping, advised pt when VS are not normal that will occur. PT ask to cut the alarms off advised pt and visitor we are not able to accommodate the request as the alarms allow us  to monitor what VS are not within normal range. Pt visitor ask when people have AFIB, what are they supposed to do? Advised generally see a cardiologist and take daily meds.

## 2023-12-17 NOTE — ED Notes (Signed)
 Dr. Jerral aware of 2.4 lactic acid.

## 2023-12-18 ENCOUNTER — Inpatient Hospital Stay
Admission: EM | Admit: 2023-12-18 | Discharge: 2023-12-23 | DRG: 441 | Disposition: A | Discharge status: Home / Self Care | Source: Ambulatory Visit | Attending: Internal Medicine | Admitting: Internal Medicine

## 2023-12-18 ENCOUNTER — Other Ambulatory Visit: Payer: Self-pay

## 2023-12-18 ENCOUNTER — Emergency Department

## 2023-12-18 ENCOUNTER — Ambulatory Visit: Admitting: Student-PharmD

## 2023-12-18 DIAGNOSIS — R4701 Aphasia: Secondary | ICD-10-CM | POA: Diagnosis present

## 2023-12-18 DIAGNOSIS — K7682 Hepatic encephalopathy: Secondary | ICD-10-CM | POA: Diagnosis not present

## 2023-12-18 DIAGNOSIS — I120 Hypertensive chronic kidney disease with stage 5 chronic kidney disease or end stage renal disease: Secondary | ICD-10-CM | POA: Diagnosis present

## 2023-12-18 DIAGNOSIS — Z79624 Long term (current) use of inhibitors of nucleotide synthesis: Secondary | ICD-10-CM

## 2023-12-18 DIAGNOSIS — Z1152 Encounter for screening for COVID-19: Secondary | ICD-10-CM

## 2023-12-18 DIAGNOSIS — K7460 Unspecified cirrhosis of liver: Secondary | ICD-10-CM | POA: Diagnosis present

## 2023-12-18 DIAGNOSIS — T473X6A Underdosing of saline and osmotic laxatives, initial encounter: Secondary | ICD-10-CM | POA: Diagnosis present

## 2023-12-18 DIAGNOSIS — Z79899 Other long term (current) drug therapy: Secondary | ICD-10-CM

## 2023-12-18 DIAGNOSIS — I480 Paroxysmal atrial fibrillation: Secondary | ICD-10-CM | POA: Diagnosis present

## 2023-12-18 DIAGNOSIS — E1129 Type 2 diabetes mellitus with other diabetic kidney complication: Secondary | ICD-10-CM | POA: Diagnosis present

## 2023-12-18 DIAGNOSIS — Z7985 Long-term (current) use of injectable non-insulin antidiabetic drugs: Secondary | ICD-10-CM

## 2023-12-18 DIAGNOSIS — Z79621 Long term (current) use of calcineurin inhibitor: Secondary | ICD-10-CM

## 2023-12-18 DIAGNOSIS — I10 Essential (primary) hypertension: Secondary | ICD-10-CM | POA: Diagnosis present

## 2023-12-18 DIAGNOSIS — T8649 Other complications of liver transplant: Principal | ICD-10-CM | POA: Diagnosis present

## 2023-12-18 DIAGNOSIS — Z6841 Body Mass Index (BMI) 40.0 and over, adult: Secondary | ICD-10-CM

## 2023-12-18 DIAGNOSIS — E66813 Obesity, class 3: Secondary | ICD-10-CM | POA: Diagnosis present

## 2023-12-18 DIAGNOSIS — E1122 Type 2 diabetes mellitus with diabetic chronic kidney disease: Secondary | ICD-10-CM | POA: Diagnosis present

## 2023-12-18 DIAGNOSIS — Z7901 Long term (current) use of anticoagulants: Secondary | ICD-10-CM

## 2023-12-18 DIAGNOSIS — I998 Other disorder of circulatory system: Secondary | ICD-10-CM

## 2023-12-18 DIAGNOSIS — Z7982 Long term (current) use of aspirin: Secondary | ICD-10-CM

## 2023-12-18 DIAGNOSIS — I2510 Atherosclerotic heart disease of native coronary artery without angina pectoris: Secondary | ICD-10-CM | POA: Diagnosis present

## 2023-12-18 DIAGNOSIS — Z794 Long term (current) use of insulin: Secondary | ICD-10-CM

## 2023-12-18 DIAGNOSIS — I82409 Acute embolism and thrombosis of unspecified deep veins of unspecified lower extremity: Secondary | ICD-10-CM | POA: Diagnosis present

## 2023-12-18 DIAGNOSIS — Z7984 Long term (current) use of oral hypoglycemic drugs: Secondary | ICD-10-CM

## 2023-12-18 DIAGNOSIS — R414 Neurologic neglect syndrome: Secondary | ICD-10-CM | POA: Diagnosis present

## 2023-12-18 DIAGNOSIS — Y830 Surgical operation with transplant of whole organ as the cause of abnormal reaction of the patient, or of later complication, without mention of misadventure at the time of the procedure: Secondary | ICD-10-CM | POA: Diagnosis present

## 2023-12-18 DIAGNOSIS — Z944 Liver transplant status: Secondary | ICD-10-CM

## 2023-12-18 DIAGNOSIS — Z992 Dependence on renal dialysis: Secondary | ICD-10-CM

## 2023-12-18 DIAGNOSIS — E1165 Type 2 diabetes mellitus with hyperglycemia: Secondary | ICD-10-CM | POA: Diagnosis present

## 2023-12-18 DIAGNOSIS — Z888 Allergy status to other drugs, medicaments and biological substances status: Secondary | ICD-10-CM

## 2023-12-18 DIAGNOSIS — N186 End stage renal disease: Secondary | ICD-10-CM | POA: Diagnosis present

## 2023-12-18 DIAGNOSIS — D631 Anemia in chronic kidney disease: Secondary | ICD-10-CM | POA: Diagnosis present

## 2023-12-18 LAB — COMPREHENSIVE METABOLIC PANEL WITH GFR
ALT: 85 U/L — ABNORMAL HIGH (ref 0–44)
AST: 41 U/L (ref 15–41)
Albumin: 3.7 g/dL (ref 3.5–5.0)
Alkaline Phosphatase: 307 U/L — ABNORMAL HIGH (ref 38–126)
Anion gap: 15 (ref 5–15)
BUN: 20 mg/dL (ref 6–20)
CO2: 25 mmol/L (ref 22–32)
Calcium: 9.4 mg/dL (ref 8.9–10.3)
Chloride: 99 mmol/L (ref 98–111)
Creatinine, Ser: 6.03 mg/dL — ABNORMAL HIGH (ref 0.61–1.24)
GFR, Estimated: 10 mL/min — ABNORMAL LOW (ref >60)
Glucose, Bld: 230 mg/dL — ABNORMAL HIGH (ref 70–99)
Potassium: 3.8 mmol/L (ref 3.5–5.1)
Sodium: 139 mmol/L (ref 135–145)
Total Bilirubin: 1.3 mg/dL — ABNORMAL HIGH (ref 0.0–1.2)
Total Protein: 7.3 g/dL (ref 6.5–8.1)

## 2023-12-18 LAB — CBC WITH DIFFERENTIAL/PLATELET
Abs Immature Granulocytes: 0.02 10*3/uL (ref 0.00–0.07)
Basophils Absolute: 0 10*3/uL (ref 0.0–0.1)
Basophils Relative: 0 %
Eosinophils Absolute: 0.2 10*3/uL (ref 0.0–0.5)
Eosinophils Relative: 2 %
HCT: 42.8 % (ref 39.0–52.0)
Hemoglobin: 14.3 g/dL (ref 13.0–17.0)
Immature Granulocytes: 0 %
Lymphocytes Relative: 18 %
Lymphs Abs: 1.5 10*3/uL (ref 0.7–4.0)
MCH: 29.6 pg (ref 26.0–34.0)
MCHC: 33.4 g/dL (ref 30.0–36.0)
MCV: 88.6 fL (ref 80.0–100.0)
Monocytes Absolute: 0.7 10*3/uL (ref 0.1–1.0)
Monocytes Relative: 8 %
Neutro Abs: 6.1 10*3/uL (ref 1.7–7.7)
Neutrophils Relative %: 72 %
Platelets: 268 10*3/uL (ref 150–400)
RBC: 4.83 MIL/uL (ref 4.22–5.81)
RDW: 14.8 % (ref 11.5–15.5)
WBC: 8.5 10*3/uL (ref 4.0–10.5)
nRBC: 0 % (ref 0.0–0.2)

## 2023-12-18 LAB — TROPONIN I (HIGH SENSITIVITY)
Troponin I (High Sensitivity): 64 ng/L — ABNORMAL HIGH (ref <18)
Troponin I (High Sensitivity): 63 ng/L — ABNORMAL HIGH (ref <18)

## 2023-12-18 LAB — RESP PANEL BY RT-PCR (RSV, FLU A&B, COVID)  RVPGX2
Influenza A by PCR: NEGATIVE
Influenza B by PCR: NEGATIVE
Resp Syncytial Virus by PCR: NEGATIVE
SARS Coronavirus 2 by RT PCR: NEGATIVE

## 2023-12-18 LAB — TSH: TSH: 2.732 u[IU]/mL (ref 0.350–4.500)

## 2023-12-18 LAB — VITAMIN B12: Vitamin B-12: 818 pg/mL (ref 180–914)

## 2023-12-18 LAB — AMMONIA: Ammonia: 143 umol/L — ABNORMAL HIGH (ref 9–35)

## 2023-12-18 LAB — GLUCOSE, CAPILLARY: Glucose-Capillary: 192 mg/dL — ABNORMAL HIGH (ref 70–99)

## 2023-12-18 LAB — CBG MONITORING, ED
Glucose-Capillary: 213 mg/dL — ABNORMAL HIGH (ref 70–99)
Glucose-Capillary: 247 mg/dL — ABNORMAL HIGH (ref 70–99)

## 2023-12-18 LAB — ETHANOL: Alcohol, Ethyl (B): <15 mg/dL (ref <15)

## 2023-12-18 MED ORDER — HYDRALAZINE HCL 25 MG PO TABS
25.0000 mg | ORAL_TABLET | Freq: Four times a day (QID) | ORAL | Status: DC
  Administered 2023-12-18 – 2023-12-21 (×6): 25 mg via ORAL
  Filled 2023-12-18 (×8): qty 1

## 2023-12-18 MED ORDER — INSULIN ASPART 100 UNIT/ML IJ SOLN
0.0000 [IU] | Freq: Three times a day (TID) | INTRAMUSCULAR | Status: DC
  Administered 2023-12-18: 3 [IU] via SUBCUTANEOUS
  Administered 2023-12-19: 5 [IU] via SUBCUTANEOUS
  Administered 2023-12-19: 3 [IU] via SUBCUTANEOUS
  Administered 2023-12-19: 5 [IU] via SUBCUTANEOUS
  Administered 2023-12-20: 2 [IU] via SUBCUTANEOUS
  Administered 2023-12-20: 3 [IU] via SUBCUTANEOUS
  Administered 2023-12-21: 9 [IU] via SUBCUTANEOUS
  Administered 2023-12-21: 7 [IU] via SUBCUTANEOUS
  Administered 2023-12-21: 2 [IU] via SUBCUTANEOUS
  Administered 2023-12-22: 3 [IU] via SUBCUTANEOUS
  Administered 2023-12-22: 7 [IU] via SUBCUTANEOUS
  Administered 2023-12-23 (×2): 3 [IU] via SUBCUTANEOUS
  Filled 2023-12-18 (×13): qty 1

## 2023-12-18 MED ORDER — HYDRALAZINE HCL 20 MG/ML IJ SOLN: 5.0000 mg | INTRAMUSCULAR | Status: DC | PRN

## 2023-12-18 MED ORDER — ONDANSETRON HCL 4 MG/2ML IJ SOLN: 4.0000 mg | Freq: Four times a day (QID) | INTRAMUSCULAR | Status: DC | PRN

## 2023-12-18 MED ORDER — ACETAMINOPHEN 325 MG PO TABS
650.0000 mg | ORAL_TABLET | Freq: Four times a day (QID) | ORAL | Status: DC | PRN
Start: 2023-12-18 — End: 2023-12-23

## 2023-12-18 MED ORDER — APIXABAN 5 MG PO TABS
5.0000 mg | ORAL_TABLET | Freq: Two times a day (BID) | ORAL | Status: DC
  Administered 2023-12-18 – 2023-12-23 (×9): 5 mg via ORAL
  Filled 2023-12-18 (×9): qty 1

## 2023-12-18 MED ORDER — ACETAMINOPHEN 650 MG RE SUPP: 650.0000 mg | Freq: Four times a day (QID) | RECTAL | Status: DC | PRN

## 2023-12-18 MED ORDER — IOHEXOL 350 MG/ML SOLN
75.0000 mL | Freq: Once | INTRAVENOUS | Status: AC | PRN
  Administered 2023-12-18: 75 mL via INTRAVENOUS

## 2023-12-18 MED ORDER — LACTULOSE 10 GM/15ML PO SOLN
30.0000 g | Freq: Once | ORAL | Status: AC
  Administered 2023-12-18: 30 g via ORAL
  Filled 2023-12-18: qty 60

## 2023-12-18 MED ORDER — ISOSORBIDE MONONITRATE ER 60 MG PO TB24: 30.0000 mg | ORAL_TABLET | Freq: Every day | ORAL | Status: DC

## 2023-12-18 MED ORDER — INSULIN GLARGINE-YFGN 100 UNIT/ML ~~LOC~~ SOLN
12.0000 [IU] | Freq: Every day | SUBCUTANEOUS | Status: DC
  Administered 2023-12-18 – 2023-12-22 (×5): 12 [IU] via SUBCUTANEOUS
  Filled 2023-12-18 (×6): qty 0.12

## 2023-12-18 MED ORDER — LACTULOSE 10 GM/15ML PO SOLN: 30.0000 g | ORAL | Status: DC | PRN

## 2023-12-18 MED ORDER — CLONIDINE HCL 0.1 MG PO TABS
0.1000 mg | ORAL_TABLET | Freq: Every day | ORAL | Status: DC
  Administered 2023-12-18 – 2023-12-19 (×2): 0.1 mg via ORAL
  Filled 2023-12-18 (×4): qty 1

## 2023-12-18 MED ORDER — TACROLIMUS 1 MG PO CAPS
1.0000 mg | ORAL_CAPSULE | Freq: Two times a day (BID) | ORAL | Status: DC
  Administered 2023-12-18 – 2023-12-23 (×9): 1 mg via ORAL
  Filled 2023-12-18: qty 2
  Filled 2023-12-18 (×3): qty 1
  Filled 2023-12-18 (×2): qty 2
  Filled 2023-12-18: qty 1
  Filled 2023-12-18: qty 2
  Filled 2023-12-18 (×2): qty 1

## 2023-12-18 MED ORDER — CARVEDILOL 12.5 MG PO TABS
12.5000 mg | ORAL_TABLET | Freq: Two times a day (BID) | ORAL | Status: DC
  Administered 2023-12-18 – 2023-12-21 (×5): 12.5 mg via ORAL
  Filled 2023-12-18 (×2): qty 1
  Filled 2023-12-18 (×2): qty 2
  Filled 2023-12-18 (×2): qty 1
  Filled 2023-12-18: qty 2

## 2023-12-18 MED ORDER — ONDANSETRON HCL 4 MG PO TABS: 4.0000 mg | ORAL_TABLET | Freq: Four times a day (QID) | ORAL | Status: DC | PRN

## 2023-12-18 MED ORDER — MYCOPHENOLATE SODIUM 180 MG PO TBEC
180.0000 mg | DELAYED_RELEASE_TABLET | Freq: Two times a day (BID) | ORAL | Status: DC
  Administered 2023-12-18 – 2023-12-23 (×9): 180 mg via ORAL
  Filled 2023-12-18 (×10): qty 1

## 2023-12-18 MED ORDER — LORAZEPAM 2 MG/ML IJ SOLN
2.0000 mg | Freq: Once | INTRAMUSCULAR | Status: AC
  Administered 2023-12-18: 2 mg via INTRAVENOUS
  Filled 2023-12-18: qty 1

## 2023-12-18 MED ORDER — INSULIN ASPART (U-100) 100 UNIT/ML (3 ML) SUBCUTANEOUS PEN
Freq: Three times a day (TID) | SUBCUTANEOUS | 0 refills | 57.00000 days | Status: CP
Start: 2023-12-18 — End: ?
  Filled 2023-12-19: qty 60, 57d supply, fill #0

## 2023-12-18 NOTE — ED Notes (Signed)
 Report called to Cherene RN cpod nurse.

## 2023-12-18 NOTE — ED Notes (Signed)
 Resumed care from [employer].  Pt sleeping  afib on monitor.  Skin warm and dry.  Siderails up x 2

## 2023-12-18 NOTE — ED Notes (Signed)
 Family  member (sister) called back and updated via telephone.

## 2023-12-18 NOTE — ED Provider Notes (Signed)
 [facility] Provider Note    Event Date/Time   First MD Initiated Contact with Patient 12/18/23 1024     (approximate)  History   Chief Complaint: Code stroke  HPI  Wayne Parker is a 58 y.o. male with a past medical history of ESRD on HD, cirrhosis, hypertension who presents to the emergency department as a code stroke from EMS.  According to EMS patient was getting his dialysis he had approximately 1.5 hours left in his session when he became acutely weak with right-sided deficits and abnormal speech.  According to EMS per report patient developed acute onset of right upper extremity and lower extremity weakness as well as aphasia which the patient was saying inappropriate words as well as garbled speech.  The speech has improved during transport here the patient was able to tell me his name, was able to follow simple commands but does appear fatigued/generalized weak.  Good grip strength bilaterally no obvious focal deficit during my brief evaluation prior to CT.  Physical Exam   Most recent vital signs: There were no vitals filed for this visit.  General: Awake, no distress.  Patient's right AV fistula remains accessed.  Patient does appear to be fatigued. CV:  Good peripheral perfusion.  Regular rate and rhythm  Resp:  Normal effort.  Equal breath sounds bilaterally.  Abd:  No distention.  Soft, nontender.  No rebound or guarding. Other:  Equal grip strength bilaterally.  Patient is somewhat slow to respond but speech seems appropriate.   ED Results / Procedures / Treatments   EKG  EKG viewed and interpreted by myself shows what appears to be atrial fibrillation at 74 bpm with a widened QRS, normal axis, largely normal intervals with nonspecific ST changes.  RADIOLOGY  I have reviewed interpret the CT images.  No obvious bleed seen on my evaluation. Radiology has read questionable area at the right posterior frontal lobe. CTA shows no acute  findings.   MEDICATIONS ORDERED IN ED: Medications - No data to display   IMPRESSION / MDM / ASSESSMENT AND PLAN / ED COURSE  I reviewed the triage vital signs and the nursing notes.  Patient's presentation is most consistent with acute presentation with potential threat to life or bodily function.  Patient presents to the emergency department as a code stroke coming from dialysis for acute onset of right-sided deficits and speech difficulties starting around 10 AM.  Here the patient appears to be fatigued with slow responses but appropriate.  Equal grip strengths.  Neurology seeing in conjunction with myself.  We will send for emergent CT imaging and continue to closely monitor awaiting neurology recommendations.  Neurology has seen and evaluated.  Patient with altered mental status, scoring a 9 on the NIH stroke scale per neurology.  Patient is workup today shows a reassuring CT scan.  Patient's lab work however shows slight troponin elevation likely due to the patient's end-stage renal disease with a reassuring chemistry with a normal potassium.  Patient CBC is normal to normal white blood cell count.  Patient's ammonia level however is significantly elevated 143, highly suspect hepatic encephalopathy to be the cause of the patient's altered mental status.  Neurology would like an MRI to ensure no CVA.  Will order the MRI we will order lactulose we will admit to the hospitalist service for further workup and treatment.  Patient did require Ativan in the emergency department due to acute agitation.  No TNK.  CRITICAL CARE Performed by: Franky  Shikara Mcauliffe   Total critical care time: 30 minutes  Critical care time was exclusive of separately billable procedures and treating other patients.  Critical care was necessary to treat or prevent imminent or life-threatening deterioration.  Critical care was time spent personally by me on the following activities: development of treatment plan with  patient and/or surrogate as well as nursing, discussions with consultants, evaluation of patient's response to treatment, examination of patient, obtaining history from patient or surrogate, ordering and performing treatments and interventions, ordering and review of laboratory studies, ordering and review of radiographic studies, pulse oximetry and re-evaluation of patient's condition.   FINAL CLINICAL IMPRESSION(S) / ED DIAGNOSES   Weakness Aphasia Hepatic encephalopathy Agitation   Note:  This document was prepared using Dragon voice recognition software and may include unintentional dictation errors.   Dorothyann Drivers, MD 12/18/23 1226

## 2023-12-18 NOTE — H&P (Signed)
 History and Physical    Patient: Wayne Parker FMW:[id number] DOB: 1965-08-24 DOA: 12/18/2023 DOS: the patient was seen and examined on 12/18/2023 PCP: Jama Selinda BRAVO, MD  Patient coming from: Home; NOK: Darin Olam Ohm, [phone]   Chief Complaint: Code stroke  HPI: Kevonta Phariss is a 58 y.o. male with medical history significant of cirrhosis, HTN, morbid obesity, OSA, and ESRD who presented on 6/24 with code stroke. He was sent from HD with concern for CVA.  Reportedly, he was 1.5 hours into his session when he developed acute R-sided weakness, LE weakness, and aphasia/dysarthria.  Code stroke called.  He is currently somnolent and confused with perseverative speech.    ER Course:  Came from HD with aphasia, RUE weakness.  Neurology has seen, code stroke.  Somnolent, confused.  CT negative.  NH4 143, likely hepatic encephalopathy.  Still needs MRI.  Given lactulose.     Review of Systems: unable to review all systems due to the inability of the patient to answer questions. Past Medical History:  Diagnosis Date   Chronic kidney disease    Cirrhosis (HCC)    Heart murmur    Hypertension    Sepsis (HCC)    Sleep apnea    Past Surgical History:  Procedure Laterality Date   LIVER TRANSPLANT     NO PAST SURGERIES     Social History:  reports that he has never smoked. He has never used smokeless tobacco. He reports that he does not drink alcohol. No history on file for drug use.  Allergies  Allergen Reactions   Insulin  Aspart Prot & Aspart Other (See Comments)    Burning on injection and skin reaction.      No family history on file.  Prior to Admission medications   Medication Sig Start Date End Date Taking? Authorizing Provider  APIXABAN  (ELIQUIS ) VTE STARTER PACK (10MG  AND 5MG ) Take as directed on package: start with two-5mg  tablets twice daily for 7 days. On day 8, switch to one-5mg  tablet twice daily. 12/08/23   Zelaya, Oscar A, PA-C  aspirin  EC 81 MG EC tablet  Take 1 tablet (81 mg total) by mouth daily. Swallow whole. 07/25/20   Leotis Bogus, MD  azithromycin  (ZITHROMAX ) 250 MG tablet Take zithromax  250 mg daily for 3 days. 07/25/20   Leotis Bogus, MD  carvedilol  (COREG ) 12.5 MG tablet Take 12.5 mg by mouth 2 (two) times daily with a meal.    [provider]  cholecalciferol  (VITAMIN D3) 25 MCG (1000 UNIT) tablet Take 1,000 Units by mouth daily.    [provider]  cloNIDine  (CATAPRES ) 0.1 MG tablet Take 0.1 mg by mouth at bedtime.    [provider]  dextromethorphan -guaiFENesin  (MUCINEX  DM) 30-600 MG 12hr tablet Take 1 tablet by mouth 2 (two) times daily as needed for cough. 07/24/20   Leotis Bogus, MD  furosemide  (LASIX ) 40 MG tablet Take 40-80 mg by mouth See admin instructions. Take 1 tablet (40 mg) daily. Take 2 tablets (80 mg) if you weigh >300 lbs    [provider]  hydrALAZINE  (APRESOLINE ) 25 MG tablet Take 25 mg by mouth 4 (four) times daily.    [provider]  insulin  aspart (NOVOLOG  FLEXPEN) 100 UNIT/ML FlexPen Inject 10-30 Units into the skin 3 (three) times daily with meals. Per sliding scale    [provider]  insulin  detemir (LEVEMIR ) 100 UNIT/ML injection Inject 12 Units into the skin at bedtime.    [provider]  isosorbide  mononitrate (IMDUR )  30 MG 24 hr tablet Take 30 mg by mouth daily.    [provider]  magnesium  oxide (MAG-OX) 400 MG tablet Take 400 mg by mouth daily.    [provider]  Multiple Vitamin (MULTIVITAMIN WITH MINERALS) TABS tablet Take 1 tablet by mouth daily.    [provider]  mycophenolate  (MYFORTIC ) 180 MG EC tablet Take 180 mg by mouth 2 (two) times daily.    [provider]  oxyCODONE  (ROXICODONE ) 5 MG immediate release tablet Take 1 tablet (5 mg total) by mouth every 6 (six) hours as needed for severe pain (pain score 7-10). 12/17/23   Jerral Meth, MD  oxyCODONE -acetaminophen  (PERCOCET/ROXICET)  5-325 MG tablet Take 1 tablet by mouth every 6 (six) hours as needed for severe pain (pain score 7-10). 12/08/23   Zelaya, Oscar A, PA-C  tacrolimus  (PROGRAF ) 0.5 MG capsule Take 0.5 mg by mouth 2 (two) times daily.    [provider]  Testosterone  20.25 MG/ACT (1.62%) GEL Apply 1 application topically daily. Use 2 pumps once daily and apply to skin 06/22/15   [provider]    Physical Exam: Vitals:   12/18/23 1507 12/18/23 1530 12/18/23 1545 12/18/23 1549  BP: 117/62 (!) 142/131 113/86   Pulse: 79 71 71   Resp: (!) 24 19 19    Temp:    98.5 F (36.9 C)  TempSrc:    Oral  SpO2: 100% 100% 100%   Weight:      Height:       General:  Appears calm and comfortable and is in NAD, confused Eyes:  normal lids, iris ENT:  grossly normal hearing, lips & tongue, mmm Neck:  no LAD, masses or thyromegaly Cardiovascular:  RRR. No LE edema.  Respiratory:   CTA bilaterally with no wheezes/rales/rhonchi.  Normal to mildly increased respiratory effort. Abdomen:  soft, NT, ND Skin:  no rash or induration seen on limited exam Musculoskeletal:  grossly normal tone BUE/BLE, good ROM, no bony abnormality Psychiatric:  confused mood and affect, speech perseverative, AOx2 Neurologic:  CN 2-12 grossly intact, moves all extremities in coordinated fashion   Radiological Exams on Admission: Independently reviewed - see discussion in A/P where applicable  DG Chest Portable 1 View Result Date: 12/18/2023 CLINICAL DATA:  Altered mental status. EXAM: PORTABLE CHEST 1 VIEW COMPARISON:  December 16, 2023 FINDINGS: Stable cardiomegaly. Both lungs are clear. The visualized skeletal structures are unremarkable. IMPRESSION: No active disease. Electronically Signed   By: Lynwood Landy Raddle M.D.   On: 12/18/2023 13:16   CT ANGIO HEAD NECK W WO CM (CODE STROKE) Result Date: 12/18/2023 CLINICAL DATA:  Acute neurological deficit. EXAM: CT ANGIOGRAPHY HEAD AND NECK WITH AND WITHOUT CONTRAST TECHNIQUE:  Multidetector CT imaging of the head and neck was performed using the standard protocol during bolus administration of intravenous contrast. Multiplanar CT image reconstructions and MIPs were obtained to evaluate the vascular anatomy. Carotid stenosis measurements (when applicable) are obtained utilizing NASCET criteria, using the distal internal carotid diameter as the denominator. RADIATION DOSE REDUCTION: This exam was performed according to the departmental dose-optimization program which includes automated exposure control, adjustment of the mA and/or kV according to patient size and/or use of iterative reconstruction technique. CONTRAST:  75mL OMNIPAQUE  IOHEXOL  350 MG/ML SOLN COMPARISON:  CT of the head dated December 16, 2023. FINDINGS: CT HEAD FINDINGS Brain: Normal. No evidence of hemorrhage, mass, acute cortical infarct or hydrocephalus. Vascular: Unremarkable. Skull: Intact and unremarkable. Sinuses/Orbits: Normal. Other: None. Review of the MIP  images confirms the above findings CTA NECK FINDINGS Aortic arch: Aberrant origin of the right subclavian artery. Normal in caliber and unremarkable in appearance otherwise. Right carotid system: Minimal calcification within the carotid bulb. Normal otherwise. Left carotid system: Minimal calcific plaque in the left carotid bulb. Normal otherwise. Vertebral arteries: Left vertebral artery is dominant and right is relatively hypoplastic. Both are uniform in caliber throughout the respective courses. Skeleton: Mild degenerative changes within the cervical spine. No osseous lesions. Other neck: Negative Upper chest: The lung apices are clear. Review of the MIP images confirms the above findings CTA HEAD FINDINGS Anterior circulation: Mild calcific atheromatous disease within the carotid siphons. No significant stenosis. Absent or hypoplastic left A1 segment. Anterior cerebral arteries are otherwise normal in caliber and unremarkable. The middle cerebral arteries and  their branches demonstrate no evidence of aneurysm, large vessel occlusion or flow-limiting stenosis. Posterior circulation: The basilar artery is normal in caliber and unremarkable in appearance. The cerebellar and posterior cerebral arteries and proximal branches are normal in caliber and unremarkable. Venous sinuses: The venous sinuses appear to be widely patent. Anatomic variants: Aplastic or hypoplastic left A1 segment. Review of the MIP images confirms the above findings IMPRESSION: 1. Negative CT angiogram of the head and neck. These results were called by telephone at the time of interpretation on 12/18/2023 at 11:03 am to provider ERIC [facility] , who verbally acknowledged these results. Electronically Signed   By: Evalene Coho M.D.   On: 12/18/2023 11:08   CT HEAD CODE STROKE WO CONTRAST` Result Date: 12/18/2023 CLINICAL DATA:  Code stroke.  Acute neurological deficit. EXAM: CT HEAD WITHOUT CONTRAST TECHNIQUE: Contiguous axial images were obtained from the base of the skull through the vertex without intravenous contrast. RADIATION DOSE REDUCTION: This exam was performed according to the departmental dose-optimization program which includes automated exposure control, adjustment of the mA and/or kV according to patient size and/or use of iterative reconstruction technique. COMPARISON:  CT of the head dated December 16, 2023. FINDINGS: Brain: On image 20 of series 4, there is questionable loss of the gray-white junction in the right posterior frontal lobe on a single slice. There is no evidence of acute infarction involving the left cerebral hemisphere. The brain otherwise appears normal. No evidence of hemorrhage, mass or hydrocephalus. Vascular: Mild vascular calcifications. Skull: Intact.  No bony lesions present. Sinuses/Orbits: Negative. Other: None. ASPECTS [facility] [address] Stroke Program Early CT Score) - Ganglionic level infarction (caudate, lentiform nuclei, internal capsule, insula, M1-M3 cortex): 7 -  Supraganglionic infarction (M4-M6 cortex): 2 Total score (0-10 with 10 being normal): 9 IMPRESSION: 1. Questionable loss of gray-white junction in the right posterior frontal lobe. This may represent volume averaging, however. No evidence of infarction involving the left cerebral hemisphere. 2. ASPECTS is 9 3. These results were called by telephone at the time of interpretation on 12/18/2023 at 10:46 am to provider Dr. Merrianne, who verbally acknowledged these results. Electronically Signed   By: Evalene Coho M.D.   On: 12/18/2023 10:59   CT Head Wo Contrast Result Date: 12/16/2023 CLINICAL DATA:  Recent syncopal episode with headaches, initial encounter EXAM: CT HEAD WITHOUT CONTRAST TECHNIQUE: Contiguous axial images were obtained from the base of the skull through the vertex without intravenous contrast. RADIATION DOSE REDUCTION: This exam was performed according to the departmental dose-optimization program which includes automated exposure control, adjustment of the mA and/or kV according to patient size and/or use of iterative reconstruction technique. COMPARISON:  06/02/2020 FINDINGS: Brain: No evidence of acute  infarction, hemorrhage, hydrocephalus, extra-axial collection or mass lesion/mass effect. Vascular: No hyperdense vessel or unexpected calcification. Skull: Normal. Negative for fracture or focal lesion. Sinuses/Orbits: No acute finding. Other: None. IMPRESSION: No acute intracranial abnormality noted. Electronically Signed   By: Oneil Devonshire M.D.   On: 12/16/2023 23:16   DG Chest Port 1 View Result Date: 12/16/2023 CLINICAL DATA:  Syncopal episode EXAM: PORTABLE CHEST 1 VIEW COMPARISON:  07/23/2020 FINDINGS: Cardiac shadow is enlarged but stable. No focal infiltrate or effusion is seen. A few small calcified granulomas are noted on the left. IMPRESSION: No active disease. Electronically Signed   By: Oneil Devonshire M.D.   On: 12/16/2023 23:14   DG Foot 2 Views Right Result Date:  12/16/2023 CLINICAL DATA:  Recent syncopal episode with toe pain, initial encounter EXAM: RIGHT FOOT - 2 VIEW COMPARISON:  None Available. FINDINGS: Somewhat limited by overlying artifact. No acute fracture or dislocation is noted. No soft tissue changes are seen. Calcaneal spurring is noted. IMPRESSION: No acute abnormality seen. Electronically Signed   By: Oneil Devonshire M.D.   On: 12/16/2023 23:13    EKG: none   Labs on Admission: I have personally reviewed the available labs and imaging studies at the time of the admission.  Pertinent labs:    Glucose 230 BUN 20/Creatinine 6.03/GFR 64 AP 307 AST 41/ALT 85/Bili 1.3 NH4 143 Normal CBC ETOH <15 COVID/flu/RSV negative   Assessment and Plan: Principal Problem:   Hepatic encephalopathy (HCC) Active Problems:   Hypertension   Type II diabetes mellitus with renal manifestations (HCC)   History of liver transplant (HCC)   ESRD on dialysis (HCC)   DVT (deep venous thrombosis) (HCC)   Paroxysmal atrial fibrillation (HCC)    Hepatic encephalopathy Patient with prior liver transplant and recurrent known cirrhosis  Presenting with acute onset of AMS this afternoon with neurologic deficits concerning for stroke Ammonia 143, hepatic encephalopathy appears more likely the current etiology Neurology consulted and recommend completion of stroke work-up when possible, but he would not currently be able to complete an MRI based on his level of encephalopathy Lactulose ordered, titrate to 3-4 soft stools per day   H/o liver transplant Sister reports maybe 7-8 years ago Continue mycophenolate , tacrolimus   ESRD Patient on chronic TTS HD Nephrology prn order set utilized He was unable to complete his entire session today due to acute neurological changes If unable to dc tomorrow, recommend nephrology consult for HD  HTN Continue carvedilol , clonidine , hydralazine   CAD No c/o CP at this time Hold ASA for now Continue Imdur   DM Last  A1c was 11.8 poor control Continue Semglee Cover with sensitive-scale SSI Carb modified diet   DVT  Continue Eliquis   Afib Rate controlled with carvedilol  Continue Eliquis   Morbid/class 3 obesity Body mass index is 42.68 kg/m.SABRA  Weight loss should be encouraged Outpatient PCP/bariatric medicine f/u encouraged Significantly low or high BMI is associated with higher medical risk including morbidity and mortality       Advance Care Planning:   Code Status: Full Code   Consults: None  DVT Prophylaxis: Eliquis   Family Communication: None present; I spoke with his sister by telephone  Severity of Illness: The appropriate patient status for this patient is OBSERVATION. Observation status is judged to be reasonable and necessary in order to provide the required intensity of service to ensure the patient's safety. The patient's presenting symptoms, physical exam findings, and initial radiographic and laboratory data in the context of their medical condition is felt  to place them at decreased risk for further clinical deterioration. Furthermore, it is anticipated that the patient will be medically stable for discharge from the hospital within 2 midnights of admission.   Author: Delon Herald, MD 12/18/2023 5:00 PM  For on call review [email or web address].

## 2023-12-18 NOTE — ED Notes (Signed)
 Dialysis tech at bedside to remove access needles.

## 2023-12-18 NOTE — Progress Notes (Signed)
   12/18/23 1018  Spiritual Encounters  Type of Visit Attempt (pt unavailable)  Care provided to: Pt not available  Conversation partners present during encounter Nurse  Referral source Code page  Reason for visit Code  OnCall Visit Yes   Chaplain responded to code stroke and patient was in CT and unavailable. Emergency personnel said they had left word for his wife, but were unable to reach her.

## 2023-12-18 NOTE — Progress Notes (Signed)
 CODE STROKE- PHARMACY COMMUNICATION   Time CODE STROKE called/page received: 1019  Time response to CODE STROKE was made (in person or via phone): Immediately  Time Stroke Kit retrieved from [employer] (only if needed): N/A, patient is not a candidate for TNK as he is on Eliquis . Reported last taking his medication this AM.  Name of Provider/Nurse contacted: Dr. Lindzen  Past Medical History:  Diagnosis Date   Chronic kidney disease    Cirrhosis (HCC)    Heart murmur    Hypertension    Sepsis [facility])    Sleep apnea     Wayne Parker Mare 12/18/2023  11:04 AM

## 2023-12-18 NOTE — ED Notes (Signed)
 Attempted to call family member. No answer, straight to voicemail.

## 2023-12-18 NOTE — Consult Note (Addendum)
 NEUROLOGY CONSULT NOTE   Date of service: December 18, 2023 Patient Name: Wayne Parker MRN:  [id number] DOB:  [birth year] Chief Complaint: Right sided weakness and difficulty speaking Requesting Provider: Dorothyann Drivers, MD  History of Present Illness  Wayne Parker is a 58 y.o. male with a PMHx of ESRD on HD, cirrhosis, liver transplant, HTN, prior sepsis, sleep apnea, atrial fibrillation, DVT (on anticoagulation) and heart murmur who presents via EMS to the [facility] ED from his dialysis center as a Ciode Stroke after he was noted by staff at dialysis to be altered. He had arrived at his dialysis center between 6-7 AM in his USOH. During dialysis, at about 0815, nursing staff noted him to be slower to respond, tired and acting weird. At 0930, he was with garbled speech and trouble following instructions. On EMS arrival, they noted the same, in addition to right sided weakness. Initial BP per EMS was 88/66, with a subsequent reading of 200/105. HR was 100. CBG was 213 on arrival to the ED. He was confused and agitated on initial assessment, which continued after CT.   LKW: 0815 Modified rankin score: 0-Completely asymptomatic and back to baseline post- stroke IV Thrombolysis:  No: On anticoagulation EVT:  No: CTA is negative for LVO   NIHSS components Score: Comment  1a Level of Conscious 0[]  1[x]  2[]  3[]      Poor attention; has difficulty attending to stimuli  1b LOC Questions 0[]  1[]  2[x]       1c LOC Commands 0[x]  1[]  2[]      Follows commands, but requires encouragement and repetition in the context of agitation  2 Best Gaze 0[x]  1[]  2[]       3 Visual 0[x]  1[]  2[]  3[]     Blinks to threat in bilateral temporal visual fields.  4 Facial Palsy 0[x]  1[]  2[]  3[]      5a Motor Arm - left 0[]  1[]  2[x]  3[]  4[]  UN[]    5b Motor Arm - Right 0[x]  1[]  2[]  3[]  4[]  UN[]    6a Motor Leg - Left 0[]  1[x]  2[]  3[]  4[]  UN[]    6b Motor Leg - Right 0[x]  1[]  2[]  3[]  4[]  UN[]    7 Limb Ataxia 0[x]  1[]  2[]  UN[]     No  ataxia, but subtle asterixis is noted  8 Sensory 0[]  1[x]  2[]  UN[]     Right-left confusion. Consistently states it is his right side being touched when left is being stimulated  9 Best Language 0[]  1[x]  2[]  3[]      10 Dysarthria 0[x]  1[]  2[]  UN[]      11 Extinct. and Inattention 0[]  1[x]  2[]      Decreased attention to left sided stimuli.  TOTAL:   9      ROS  Unable to ascertain due to agitation.   Past History   Past Medical History:  Diagnosis Date   Chronic kidney disease    Cirrhosis (HCC)    Heart murmur    Hypertension    Sepsis (HCC)    Sleep apnea     Past Surgical History:  Procedure Laterality Date   LIVER TRANSPLANT     NO PAST SURGERIES      Family History: No family history on file.  Social History  reports that he has never smoked. He has never used smokeless tobacco. He reports that he does not drink alcohol. No history on file for drug use.  Allergies  Allergen Reactions   Insulin  Aspart Prot & Aspart Other (See Comments)    Burning on injection and skin reaction.  Medications  No current facility-administered medications for this encounter.  Current Outpatient Medications:    APIXABAN  (ELIQUIS ) VTE STARTER PACK (10MG  AND 5MG ), Take as directed on package: start with two-5mg  tablets twice daily for 7 days. On day 8, switch to one-5mg  tablet twice daily., Disp: 74 each, Rfl: 0   aspirin  EC 81 MG EC tablet, Take 1 tablet (81 mg total) by mouth daily. Swallow whole., Disp: 30 tablet, Rfl: 11   azithromycin  (ZITHROMAX ) 250 MG tablet, Take zithromax  250 mg daily for 3 days., Disp: 6 each, Rfl: 0   carvedilol  (COREG ) 12.5 MG tablet, Take 12.5 mg by mouth 2 (two) times daily with a meal., Disp: , Rfl:    cholecalciferol  (VITAMIN D3) 25 MCG (1000 UNIT) tablet, Take 1,000 Units by mouth daily., Disp: , Rfl:    cloNIDine  (CATAPRES ) 0.1 MG tablet, Take 0.1 mg by mouth at bedtime., Disp: , Rfl:    dextromethorphan -guaiFENesin  (MUCINEX  DM) 30-600 MG 12hr  tablet, Take 1 tablet by mouth 2 (two) times daily as needed for cough., Disp: 12 tablet, Rfl: 0   furosemide  (LASIX ) 40 MG tablet, Take 40-80 mg by mouth See admin instructions. Take 1 tablet (40 mg) daily. Take 2 tablets (80 mg) if you weigh >300 lbs, Disp: , Rfl:    hydrALAZINE  (APRESOLINE ) 25 MG tablet, Take 25 mg by mouth 4 (four) times daily., Disp: , Rfl:    insulin  aspart (NOVOLOG  FLEXPEN) 100 UNIT/ML FlexPen, Inject 10-30 Units into the skin 3 (three) times daily with meals. Per sliding scale, Disp: , Rfl:    insulin  detemir (LEVEMIR ) 100 UNIT/ML injection, Inject 12 Units into the skin at bedtime., Disp: , Rfl:    isosorbide  mononitrate (IMDUR ) 30 MG 24 hr tablet, Take 30 mg by mouth daily., Disp: , Rfl:    magnesium  oxide (MAG-OX) 400 MG tablet, Take 400 mg by mouth daily., Disp: , Rfl:    Multiple Vitamin (MULTIVITAMIN WITH MINERALS) TABS tablet, Take 1 tablet by mouth daily., Disp: , Rfl:    mycophenolate  (MYFORTIC ) 180 MG EC tablet, Take 180 mg by mouth 2 (two) times daily., Disp: , Rfl:    oxyCODONE  (ROXICODONE ) 5 MG immediate release tablet, Take 1 tablet (5 mg total) by mouth every 6 (six) hours as needed for severe pain (pain score 7-10)., Disp: 15 tablet, Rfl: 0   oxyCODONE -acetaminophen  (PERCOCET/ROXICET) 5-325 MG tablet, Take 1 tablet by mouth every 6 (six) hours as needed for severe pain (pain score 7-10)., Disp: 8 tablet, Rfl: 0   tacrolimus  (PROGRAF ) 0.5 MG capsule, Take 0.5 mg by mouth 2 (two) times daily., Disp: , Rfl:    Testosterone  20.25 MG/ACT (1.62%) GEL, Apply 1 application topically daily. Use 2 pumps once daily and apply to skin, Disp: , Rfl:   Vitals   Vitals:   12/18/23 1051 12/18/23 1052 12/18/23 1109  BP:  (!) 142/65   Pulse:  86   Resp:  20   Temp:   98.5 F (36.9 C)  SpO2:  100%   Weight: 131.1 kg    Height: 5' 9 (1.753 m)      Body mass index is 42.68 kg/m.   Physical Exam   Constitutional: Morbidly obese.  Psych: Agitated.  Eyes: No  scleral injection.  HENT: No OP obstruction.  Head: Normocephalic.  Respiratory: Effort normal, non-labored breathing.    Neurologic Examination   See NIHSS  Labs/Imaging/Neurodiagnostic studies   CBC:  Recent Labs  Lab 2024-01-08 2214  WBC 7.2  HGB 14.6  HCT 44.4  MCV 90.8  PLT 265   Basic Metabolic Panel:  Lab Results  Component Value Date   NA 137 12/16/2023   K 4.9 12/16/2023   CO2 22 12/16/2023   GLUCOSE 189 (H) 12/16/2023   BUN 22 (H) 12/16/2023   CREATININE 8.04 (H) 12/16/2023   CALCIUM 10.5 (H) 12/16/2023   GFRNONAA 7 (L) 12/16/2023   GFRAA >60 09/15/2015    Alcohol Level     Component Value Date/Time   ETH <15 12/17/2023 0025     ASSESSMENT  58 year old male with a PMHx of ESRD on HD, cirrhosis, liver transplant, HTN, prior sepsis, sleep apnea, atrial fibrillation, DVT (on anticoagulation) and heart murmur who presents via EMS to the [facility] ED from his dialysis center as a Ciode Stroke after he was noted by staff at dialysis to be altered. He had arrived at his dialysis center between 6-7 AM in his USOH. During dialysis, at about 0815, nursing staff noted him to be slower to respond, tired and acting weird. At 0930, he was with garbled speech and trouble following instructions. On EMS arrival, they noted the same, in addition to right sided weakness. Initial BP per EMS was 88/66, with a subsequent reading of 200/105. HR was 100. CBG was 213 on arrival to the ED. He was confused and agitated on initial assessment, which continued after CT.  - Exam reveals a confused and agitated patient with left sided weakness and mild left hemineglect. NIHSS 9.  - CT head: Questionable loss of gray-white junction in the right posterior frontal lobe. This may represent volume averaging, however. No evidence of infarction involving the left cerebral hemisphere. ASPECTS is 9 - CTA of head and neck: Negative CT angiogram of the head and neck.  - Impression:  - Based the exam  findings, a right parietal lobe stroke is possible, but his encephalopathy and asterixis also are suggestive of a toxic, metabolic or infectious encephalopathy. Subclinical seizure activity with postictal confusion could present in this way. BP fluctuation with hypotension (can result in cognitive slowing and confusion) followed by severe hypertension (can result in hypertensive encephalopathy) is also on the DDx for underlying etiology.  - Not a candidate for IV thrombolysis as he is on anticoagulation. Not a candidate for thrombectomy given no LVO on CTA.    RECOMMENDATIONS  - MRI brain (ordered) - EEG (ordered) - Basic encephalopathy work up to include TSH, LFTs, ammonia level, UDS, EtOH level, B12 and RPR.   Addendum: - Ammonia level is severely elevated at 143. Total bilirubin and ALT are also elevated.  - Hepatic encephalopathy is now highest on the DDx for his presentation, but will need completion of the work up above as well.  ______________________________________________________________________    Bonney SHARK, Arlin Sass, MD [facility]

## 2023-12-18 NOTE — ED Notes (Signed)
 Patient transported to CT

## 2023-12-18 NOTE — ED Notes (Signed)
Pt sleeping in bed. Respirations even and unlabored.

## 2023-12-18 NOTE — ED Notes (Signed)
 Pts sister is asking for update when available. Number is in the chart.

## 2023-12-18 NOTE — ED Notes (Signed)
 Pt awake and talking  no acute distress.  Siderails up x 2.

## 2023-12-18 NOTE — Progress Notes (Addendum)
 Code stroke cart activated at 1020-EMS pre alert.  Teleneuro paged at 1022.  Pt arrived with EMS at 1023. Pt arrived in CT at 1025. Dr Merrianne in CT assessing pt at [address], Tele Stroke RN

## 2023-12-19 ENCOUNTER — Observation Stay

## 2023-12-19 ENCOUNTER — Encounter: Payer: Self-pay | Admitting: Internal Medicine

## 2023-12-19 DIAGNOSIS — E1165 Type 2 diabetes mellitus with hyperglycemia: Secondary | ICD-10-CM | POA: Diagnosis present

## 2023-12-19 DIAGNOSIS — Z7901 Long term (current) use of anticoagulants: Secondary | ICD-10-CM | POA: Diagnosis not present

## 2023-12-19 DIAGNOSIS — K7460 Unspecified cirrhosis of liver: Secondary | ICD-10-CM | POA: Diagnosis present

## 2023-12-19 DIAGNOSIS — R569 Unspecified convulsions: Secondary | ICD-10-CM | POA: Diagnosis not present

## 2023-12-19 DIAGNOSIS — T473X6A Underdosing of saline and osmotic laxatives, initial encounter: Secondary | ICD-10-CM | POA: Diagnosis present

## 2023-12-19 DIAGNOSIS — R4701 Aphasia: Secondary | ICD-10-CM | POA: Diagnosis present

## 2023-12-19 DIAGNOSIS — Z79624 Long term (current) use of inhibitors of nucleotide synthesis: Secondary | ICD-10-CM | POA: Diagnosis not present

## 2023-12-19 DIAGNOSIS — Z7982 Long term (current) use of aspirin: Secondary | ICD-10-CM | POA: Diagnosis not present

## 2023-12-19 DIAGNOSIS — I120 Hypertensive chronic kidney disease with stage 5 chronic kidney disease or end stage renal disease: Secondary | ICD-10-CM | POA: Diagnosis present

## 2023-12-19 DIAGNOSIS — Z79899 Other long term (current) drug therapy: Secondary | ICD-10-CM | POA: Diagnosis not present

## 2023-12-19 DIAGNOSIS — R414 Neurologic neglect syndrome: Secondary | ICD-10-CM | POA: Diagnosis present

## 2023-12-19 DIAGNOSIS — D631 Anemia in chronic kidney disease: Secondary | ICD-10-CM | POA: Diagnosis present

## 2023-12-19 DIAGNOSIS — Z7985 Long-term (current) use of injectable non-insulin antidiabetic drugs: Secondary | ICD-10-CM | POA: Diagnosis not present

## 2023-12-19 DIAGNOSIS — N186 End stage renal disease: Secondary | ICD-10-CM | POA: Diagnosis present

## 2023-12-19 DIAGNOSIS — E66813 Obesity, class 3: Secondary | ICD-10-CM | POA: Diagnosis present

## 2023-12-19 DIAGNOSIS — I2510 Atherosclerotic heart disease of native coronary artery without angina pectoris: Secondary | ICD-10-CM | POA: Diagnosis present

## 2023-12-19 DIAGNOSIS — K7682 Hepatic encephalopathy: Secondary | ICD-10-CM | POA: Diagnosis present

## 2023-12-19 DIAGNOSIS — Z7984 Long term (current) use of oral hypoglycemic drugs: Secondary | ICD-10-CM | POA: Diagnosis not present

## 2023-12-19 DIAGNOSIS — E1122 Type 2 diabetes mellitus with diabetic chronic kidney disease: Secondary | ICD-10-CM | POA: Diagnosis present

## 2023-12-19 DIAGNOSIS — T8649 Other complications of liver transplant: Secondary | ICD-10-CM | POA: Diagnosis present

## 2023-12-19 DIAGNOSIS — Z794 Long term (current) use of insulin: Secondary | ICD-10-CM | POA: Diagnosis not present

## 2023-12-19 DIAGNOSIS — Z6841 Body Mass Index (BMI) 40.0 and over, adult: Secondary | ICD-10-CM | POA: Diagnosis not present

## 2023-12-19 DIAGNOSIS — I480 Paroxysmal atrial fibrillation: Secondary | ICD-10-CM | POA: Diagnosis present

## 2023-12-19 DIAGNOSIS — Z888 Allergy status to other drugs, medicaments and biological substances status: Secondary | ICD-10-CM | POA: Diagnosis not present

## 2023-12-19 DIAGNOSIS — Y830 Surgical operation with transplant of whole organ as the cause of abnormal reaction of the patient, or of later complication, without mention of misadventure at the time of the procedure: Secondary | ICD-10-CM | POA: Diagnosis present

## 2023-12-19 DIAGNOSIS — Z992 Dependence on renal dialysis: Secondary | ICD-10-CM | POA: Diagnosis not present

## 2023-12-19 DIAGNOSIS — Z79621 Long term (current) use of calcineurin inhibitor: Secondary | ICD-10-CM | POA: Diagnosis not present

## 2023-12-19 DIAGNOSIS — Z1152 Encounter for screening for COVID-19: Secondary | ICD-10-CM | POA: Diagnosis not present

## 2023-12-19 LAB — BASIC METABOLIC PANEL WITH GFR
Anion gap: 13 (ref 5–15)
BUN: 31 mg/dL — ABNORMAL HIGH (ref 6–20)
CO2: 24 mmol/L (ref 22–32)
Calcium: 9.5 mg/dL (ref 8.9–10.3)
Chloride: 98 mmol/L (ref 98–111)
Creatinine, Ser: 8.03 mg/dL — ABNORMAL HIGH (ref 0.61–1.24)
GFR, Estimated: 7 mL/min — ABNORMAL LOW (ref >60)
Glucose, Bld: 303 mg/dL — ABNORMAL HIGH (ref 70–99)
Potassium: 4.3 mmol/L (ref 3.5–5.1)
Sodium: 135 mmol/L (ref 135–145)

## 2023-12-19 LAB — CBC
HCT: 39.3 % (ref 39.0–52.0)
Hemoglobin: 12.7 g/dL — ABNORMAL LOW (ref 13.0–17.0)
MCH: 28.9 pg (ref 26.0–34.0)
MCHC: 32.3 g/dL (ref 30.0–36.0)
MCV: 89.5 fL (ref 80.0–100.0)
Platelets: 235 10*3/uL (ref 150–400)
RBC: 4.39 MIL/uL (ref 4.22–5.81)
RDW: 15 % (ref 11.5–15.5)
WBC: 5.6 10*3/uL (ref 4.0–10.5)
nRBC: 0 % (ref 0.0–0.2)

## 2023-12-19 LAB — GLUCOSE, CAPILLARY
Glucose-Capillary: 231 mg/dL — ABNORMAL HIGH (ref 70–99)
Glucose-Capillary: 294 mg/dL — ABNORMAL HIGH (ref 70–99)
Glucose-Capillary: 220 mg/dL — ABNORMAL HIGH (ref 70–99)

## 2023-12-19 LAB — HIV ANTIBODY (ROUTINE TESTING W REFLEX): HIV Screen 4th Generation wRfx: NONREACTIVE

## 2023-12-19 LAB — HEPATITIS B SURFACE ANTIGEN: Hepatitis B Surface Ag: NONREACTIVE

## 2023-12-19 LAB — RPR: RPR Ser Ql: NONREACTIVE

## 2023-12-19 MED ORDER — CHLORHEXIDINE GLUCONATE CLOTH 2 % EX PADS
6.0000 | MEDICATED_PAD | Freq: Every day | CUTANEOUS | Status: DC
  Administered 2023-12-20: 6 via TOPICAL

## 2023-12-19 MED FILL — LANTHANUM 1,000 MG CHEWABLE TABLET: ORAL | 30 days supply | Qty: 90 | Fill #0

## 2023-12-19 MED FILL — PROGRAF 0.5 MG CAPSULE: ORAL | 30 days supply | Qty: 120 | Fill #7

## 2023-12-19 MED FILL — MYCOPHENOLATE MOFETIL 250 MG CAPSULE: ORAL | 30 days supply | Qty: 60 | Fill #7

## 2023-12-19 MED FILL — GABAPENTIN 300 MG CAPSULE: ORAL | 30 days supply | Qty: 30 | Fill #2

## 2023-12-19 NOTE — Inpatient Diabetes Management (Addendum)
 Inpatient Diabetes Program Recommendations  AACE/ADA: New Consensus Statement on Inpatient Glycemic Control (2015)  Target Ranges:  Prepandial:   less than 140 mg/dL      Peak postprandial:   less than 180 mg/dL (1-2 hours)      Critically ill patients:  140 - 180 mg/dL    Latest Reference Range & Units 12/18/23 10:24 12/18/23 17:52 12/18/23 21:33 12/19/23 08:17 12/19/23 13:17  Glucose-Capillary 70 - 99 mg/dL 786 (H) 752 (H)  3 units Novolog   192 (H)    12 units Semglee 231 (H)  3 units Novolog   294 (H)  5 units Novolog    (H): Data is abnormally high     Home DM Meds: Levemir  12 units at HS      Novolog  10-30 units TID      Metformin 1000 mg BID      Mounjaro 7.5 mg Qweek   Current Orders: Semglee 12 units at HS     Novolog  Sensitive Correction Scale/ SSI (0-9 units) TID AC    MD- Please consider:   1.  Increase Semglee to 15 units at HS  2. Start Novolog  Meal Coverage: Novolog  6 units TID with meals HOLD if pt NPO HOLD if pt eats <50% meals    --Will follow patient during hospitalization--  Adina Rudolpho Arrow RN, MSN, CDCES Diabetes Coordinator Inpatient Glycemic Control Team Team Pager: [phone] (8a-5p)

## 2023-12-19 NOTE — Plan of Care (Signed)

## 2023-12-19 NOTE — Plan of Care (Signed)
 MRI brain is normal.   EEG is pending.   Electronically signed: Dr. Mercadies Co

## 2023-12-19 NOTE — Progress Notes (Signed)
 PROGRESS NOTE    Wayne Parker  FMW:[id number] DOB: 06/22/1966 DOA: 12/18/2023 PCP: Jama Selinda BRAVO, MD   Assessment & Plan:   Principal Problem:   Hepatic encephalopathy [facility]) Active Problems:   Hypertension   Type II diabetes mellitus with renal manifestations (HCC)   History of liver transplant (HCC)   ESRD on dialysis (HCC)   DVT (deep venous thrombosis) (HCC)   Paroxysmal atrial fibrillation (HCC)  Assessment and Plan: Hepatic encephalopathy: w/ waxing & waning mental status. W/ prior liver transplant & recurrent known cirrhosis. Possibly secondary elevated ammonia. Was not taking lactulose at home and has not seen GI specialist since transplant in 2013 as per pt. MRI brain & CT head shows no acute intracranial abnormalities. EEG is pending. Neuro following and recs apprec  Hx liver transplant: continue on home dose of mycophenolate , tacrolimus     ESRD: on HD TTS. Unable to complete last session of HD.  Nephro consulted.    HTN: continue on coreg , clonidine , hydralazine     Hx of CAD: continue on coreg , imdur , hydralazine . Holding home dose of aspirin    DM2: poorly controlled, HbA1c 11.8. Continue on glargine, SSI w/ accuchecks    DVT: continue on eliquis    Likely PAF: continue on coreg , eliquis    Morbid obesity: BMI 42.6. Complicates overall care & prognosis       DVT prophylaxis: eliquis  Code Status: full  Family Communication: discussed pt's care w/ pt's family at bedside and answered their questions  Disposition Plan: PT/OT needs to see.  Level of care: Med-Surg  Status is: Inpatient Remains inpatient appropriate because: severity of illness      Consultants:  Neuro   Procedures:   Antimicrobials:    Subjective: Pt c/o malaise  Objective: Vitals:   12/18/23 1549 12/18/23 1830 12/18/23 1856 12/19/23 0222  BP:  (!) 131/103 (!) 139/117 129/60  Pulse:  74 86 62  Resp:  16  20  Temp: 98.5 F (36.9 C) (!) 97.4 F (36.3 C)  97.7 F (36.5 C)   TempSrc: Oral   Oral  SpO2:  99%  99%  Weight:      Height:        Intake/Output Summary (Last 24 hours) at 12/19/2023 1000 Last data filed at 12/18/2023 2220 Gross per 24 hour  Intake 240 ml  Output --  Net 240 ml   Filed Weights   12/18/23 1051  Weight: 131.1 kg    Examination:  General exam: Appears calm and comfortable  Respiratory system: Clear to auscultation. Respiratory effort normal. Cardiovascular system: S1 & S2 +. No  rubs, gallops or clicks Gastrointestinal system: Abdomen is obese, soft and nontender. Hypoactive bowel sounds heard. Central nervous system: Alert and awake. Moves all extremities  Psychiatry: Judgement and insight appears not at baseline. Flat mood and affect    Data Reviewed: I have personally reviewed following labs and imaging studies  CBC: Recent Labs  Lab 12/16/23 2214 12/18/23 1040 12/19/23 0350  WBC 7.2 8.5 5.6  NEUTROABS  --  6.1  --   HGB 14.6 14.3 12.7*  HCT 44.4 42.8 39.3  MCV 90.8 88.6 89.5  PLT 265 268 235   Basic Metabolic Panel: Recent Labs  Lab 12/16/23 2214 12/18/23 1040 12/19/23 0350  NA 137 139 135  K 4.9 3.8 4.3  CL 98 99 98  CO2 22 25 24   GLUCOSE 189* 230* 303*  BUN 22* 20 31*  CREATININE 8.04* 6.03* 8.03*  CALCIUM 10.5* 9.4 9.5  MG 2.4  --   --  GFR: Estimated Creatinine Clearance: 14 mL/min (A) (by C-G formula based on SCr of 8.03 mg/dL (H)). Liver Function Tests: Recent Labs  Lab 12/16/23 2214 12/18/23 1040  AST 182* 41  ALT 129* 85*  ALKPHOS 441* 307*  BILITOT 0.9 1.3*  PROT 7.8 7.3  ALBUMIN 3.8 3.7   No results for input(s): LIPASE, AMYLASE in the last 168 hours. Recent Labs  Lab 12/18/23 1120  AMMONIA 143*   Coagulation Profile: No results for input(s): INR, PROTIME in the last 168 hours. Cardiac Enzymes: No results for input(s): CKTOTAL, CKMB, CKMBINDEX, TROPONINI in the last 168 hours. BNP (last 3 results) No results for input(s): PROBNP in the last 8760  hours. HbA1C: No results for input(s): HGBA1C in the last 72 hours. CBG: Recent Labs  Lab 12/16/23 2222 12/18/23 1024 12/18/23 1752 12/18/23 2133 12/19/23 0817  GLUCAP 232* 213* 247* 192* 231*   Lipid Profile: No results for input(s): CHOL, HDL, LDLCALC, TRIG, CHOLHDL, LDLDIRECT in the last 72 hours. Thyroid Function Tests: Recent Labs    12/18/23 2128  TSH 2.732   Anemia Panel: Recent Labs    12/18/23 2128  VITAMINB12 818   Sepsis Labs: Recent Labs  Lab 12/17/23 0025  LATICACIDVEN 2.4*    Recent Results (from the past 240 hours)  Resp panel by RT-PCR (RSV, Flu A&B, Covid) Anterior Nasal Swab     Status: None   Collection Time: 12/18/23 11:56 AM   Specimen: Anterior Nasal Swab  Result Value Ref Range Status   SARS Coronavirus 2 by RT PCR NEGATIVE NEGATIVE Final    Comment: (NOTE) SARS-CoV-2 target nucleic acids are NOT DETECTED.  The SARS-CoV-2 RNA is generally detectable in upper respiratory specimens during the acute phase of infection. The lowest concentration of SARS-CoV-2 viral copies this assay can detect is 138 copies/mL. A negative result does not preclude SARS-Cov-2 infection and should not be used as the sole basis for treatment or other patient management decisions. A negative result may occur with  improper specimen collection/handling, submission of specimen other than nasopharyngeal swab, presence of viral mutation(s) within the areas targeted by this assay, and inadequate number of viral copies(<138 copies/mL). A negative result must be combined with clinical observations, patient history, and epidemiological information. The expected result is Negative.  Fact Sheet for Patients:  BloggerCourse.com  Fact Sheet for Healthcare Providers:  SeriousBroker.it  This test is no t yet approved or cleared by the [place]  FDA and  has been authorized for detection and/or diagnosis of  SARS-CoV-2 by FDA under an Emergency Use Authorization (EUA). This EUA will remain  in effect (meaning this test can be used) for the duration of the COVID-19 declaration under Section 564(b)(1) of the Act, 21 U.S.C.section 360bbb-3(b)(1), unless the authorization is terminated  or revoked sooner.       Influenza A by PCR NEGATIVE NEGATIVE Final   Influenza B by PCR NEGATIVE NEGATIVE Final    Comment: (NOTE) The Xpert Xpress SARS-CoV-2/FLU/RSV plus assay is intended as an aid in the diagnosis of influenza from Nasopharyngeal swab specimens and should not be used as a sole basis for treatment. Nasal washings and aspirates are unacceptable for Xpert Xpress SARS-CoV-2/FLU/RSV testing.  Fact Sheet for Patients: BloggerCourse.com  Fact Sheet for Healthcare Providers: SeriousBroker.it  This test is not yet approved or cleared by the [place]  FDA and has been authorized for detection and/or diagnosis of SARS-CoV-2 by FDA under an Emergency Use Authorization (EUA). This EUA will remain in effect (meaning this  test can be used) for the duration of the COVID-19 declaration under Section 564(b)(1) of the Act, 21 U.S.C. section 360bbb-3(b)(1), unless the authorization is terminated or revoked.     Resp Syncytial Virus by PCR NEGATIVE NEGATIVE Final    Comment: (NOTE) Fact Sheet for Patients: BloggerCourse.com  Fact Sheet for Healthcare Providers: SeriousBroker.it  This test is not yet approved or cleared by the [place]  FDA and has been authorized for detection and/or diagnosis of SARS-CoV-2 by FDA under an Emergency Use Authorization (EUA). This EUA will remain in effect (meaning this test can be used) for the duration of the COVID-19 declaration under Section 564(b)(1) of the Act, 21 U.S.C. section 360bbb-3(b)(1), unless the authorization is terminated  or revoked.  Performed at [facility], [address], [place], [place] [address]          Radiology Studies: MR BRAIN WO CONTRAST Result Date: 12/19/2023 CLINICAL DATA:  Follow-up examination for stroke. EXAM: MRI HEAD WITHOUT CONTRAST TECHNIQUE: Multiplanar, multiecho pulse sequences of the brain and surrounding structures were obtained without intravenous contrast. COMPARISON:  Prior CTs from 12/18/2023. FINDINGS: Brain: Cerebral volume within normal limits for age. Minimal patchy T2/FLAIR signal abnormality noted involving the right posterior periventricular white matter, nonspecific, but most commonly related to chronic microvascular ischemic disease, overall minimal in nature and less than is typically seen for age. No abnormal foci of restricted diffusion to suggest acute or subacute ischemia. Gray-white matter differentiation maintained. No areas of chronic cortical infarction. No acute or chronic intracranial blood products. No mass lesion, midline shift or mass effect. No hydrocephalus or extra-axial fluid collection. Pituitary gland and suprasellar region within normal limits. Vascular: Major intracranial vascular flow voids are maintained. Skull and upper cervical spine: Craniocervical junction within normal limits. Decreased T1 signal intensity noted within the bone marrow the visualized upper cervical spine, likely related history of end-stage renal disease and anemia. Sinuses/Orbits: Globes orbital soft tissues within normal limits. Paranasal sinuses are largely clear. Trace bilateral mastoid effusions noted, bowel significance. Other: None. IMPRESSION: Normal brain MRI for age. No acute intracranial abnormality. Electronically Signed   By: Morene Hoard M.D.   On: 12/19/2023 02:20   DG Chest Portable 1 View Result Date: 12/18/2023 CLINICAL DATA:  Altered mental status. EXAM: PORTABLE CHEST 1 VIEW COMPARISON:  December 16, 2023 FINDINGS: Stable cardiomegaly. Both lungs  are clear. The visualized skeletal structures are unremarkable. IMPRESSION: No active disease. Electronically Signed   By: Lynwood Landy Raddle M.D.   On: 12/18/2023 13:16   CT ANGIO HEAD NECK W WO CM (CODE STROKE) Result Date: 12/18/2023 CLINICAL DATA:  Acute neurological deficit. EXAM: CT ANGIOGRAPHY HEAD AND NECK WITH AND WITHOUT CONTRAST TECHNIQUE: Multidetector CT imaging of the head and neck was performed using the standard protocol during bolus administration of intravenous contrast. Multiplanar CT image reconstructions and MIPs were obtained to evaluate the vascular anatomy. Carotid stenosis measurements (when applicable) are obtained utilizing NASCET criteria, using the distal internal carotid diameter as the denominator. RADIATION DOSE REDUCTION: This exam was performed according to the departmental dose-optimization program which includes automated exposure control, adjustment of the mA and/or kV according to patient size and/or use of iterative reconstruction technique. CONTRAST:  75mL OMNIPAQUE  IOHEXOL  350 MG/ML SOLN COMPARISON:  CT of the head dated December 16, 2023. FINDINGS: CT HEAD FINDINGS Brain: Normal. No evidence of hemorrhage, mass, acute cortical infarct or hydrocephalus. Vascular: Unremarkable. Skull: Intact and unremarkable. Sinuses/Orbits: Normal. Other: None. Review of the MIP images confirms the  above findings CTA NECK FINDINGS Aortic arch: Aberrant origin of the right subclavian artery. Normal in caliber and unremarkable in appearance otherwise. Right carotid system: Minimal calcification within the carotid bulb. Normal otherwise. Left carotid system: Minimal calcific plaque in the left carotid bulb. Normal otherwise. Vertebral arteries: Left vertebral artery is dominant and right is relatively hypoplastic. Both are uniform in caliber throughout the respective courses. Skeleton: Mild degenerative changes within the cervical spine. No osseous lesions. Other neck: Negative Upper chest: The lung  apices are clear. Review of the MIP images confirms the above findings CTA HEAD FINDINGS Anterior circulation: Mild calcific atheromatous disease within the carotid siphons. No significant stenosis. Absent or hypoplastic left A1 segment. Anterior cerebral arteries are otherwise normal in caliber and unremarkable. The middle cerebral arteries and their branches demonstrate no evidence of aneurysm, large vessel occlusion or flow-limiting stenosis. Posterior circulation: The basilar artery is normal in caliber and unremarkable in appearance. The cerebellar and posterior cerebral arteries and proximal branches are normal in caliber and unremarkable. Venous sinuses: The venous sinuses appear to be widely patent. Anatomic variants: Aplastic or hypoplastic left A1 segment. Review of the MIP images confirms the above findings IMPRESSION: 1. Negative CT angiogram of the head and neck. These results were called by telephone at the time of interpretation on 12/18/2023 at 11:03 am to provider ERIC [facility] , who verbally acknowledged these results. Electronically Signed   By: Evalene Coho M.D.   On: 12/18/2023 11:08   CT HEAD CODE STROKE WO CONTRAST` Result Date: 12/18/2023 CLINICAL DATA:  Code stroke.  Acute neurological deficit. EXAM: CT HEAD WITHOUT CONTRAST TECHNIQUE: Contiguous axial images were obtained from the base of the skull through the vertex without intravenous contrast. RADIATION DOSE REDUCTION: This exam was performed according to the departmental dose-optimization program which includes automated exposure control, adjustment of the mA and/or kV according to patient size and/or use of iterative reconstruction technique. COMPARISON:  CT of the head dated December 16, 2023. FINDINGS: Brain: On image 20 of series 4, there is questionable loss of the gray-white junction in the right posterior frontal lobe on a single slice. There is no evidence of acute infarction involving the left cerebral hemisphere. The brain  otherwise appears normal. No evidence of hemorrhage, mass or hydrocephalus. Vascular: Mild vascular calcifications. Skull: Intact.  No bony lesions present. Sinuses/Orbits: Negative. Other: None. ASPECTS [facility] Stroke Program Early CT Score) - Ganglionic level infarction (caudate, lentiform nuclei, internal capsule, insula, M1-M3 cortex): 7 - Supraganglionic infarction (M4-M6 cortex): 2 Total score (0-10 with 10 being normal): 9 IMPRESSION: 1. Questionable loss of gray-white junction in the right posterior frontal lobe. This may represent volume averaging, however. No evidence of infarction involving the left cerebral hemisphere. 2. ASPECTS is 9 3. These results were called by telephone at the time of interpretation on 12/18/2023 at 10:46 am to provider Dr. Merrianne, who verbally acknowledged these results. Electronically Signed   By: Evalene Coho M.D.   On: 12/18/2023 10:59        Scheduled Meds:  apixaban   5 mg Oral BID   carvedilol   12.5 mg Oral BID WC   cloNIDine   0.1 mg Oral QHS   hydrALAZINE   25 mg Oral QID   insulin  aspart  0-9 Units Subcutaneous TID WC   insulin  glargine-yfgn  12 Units Subcutaneous QHS   mycophenolate   180 mg Oral BID   tacrolimus   1 mg Oral BID   Continuous Infusions:   LOS: 0 days  Chay CHRISTELLA Pouch, MD [facility] Pager [phone]  If 7PM-7AM, please contact night-coverage [email or web address] 12/19/2023, 10:00 AM  '

## 2023-12-19 NOTE — Procedures (Signed)
 Patient Name: Shilo Pauwels  MRN: [id number]  Epilepsy Attending: Arlin MALVA Krebs  Referring Physician/Provider: Merrianne Locus, MD  Date: 12/19/2023 Duration: 39.11 mins  Patient history: 57yo m with ams. EEG to evaluate for seizure.  Level of alertness: Awake  AEDs during EEG study: None  Technical aspects: This EEG study was done with scalp electrodes positioned according to the 10-20 International system of electrode placement. Electrical activity was reviewed with band pass filter of 1-70Hz , sensitivity of 7 uV/mm, display speed of 72mm/sec with a 60Hz  notched filter applied as appropriate. EEG data were recorded continuously and digitally stored.  Video monitoring was available and reviewed as appropriate.  Description: EEG showed continuous generalized 3 to 6 Hz theta-delta slowing. Intermittent 13-15hz  beta activity was noted in fronto-central region. Hyperventilation and photic stimulation were not performed.     ABNORMALITY - Continuous slow, generalized  IMPRESSION: This study is suggestive of moderate diffuse encephalopathy. No seizures or epileptiform discharges were seen throughout the recording.  Tyreece Gelles O Dugan Vanhoesen

## 2023-12-19 NOTE — Progress Notes (Signed)
 Eeg done

## 2023-12-20 DIAGNOSIS — K7682 Hepatic encephalopathy: Secondary | ICD-10-CM | POA: Diagnosis not present

## 2023-12-20 LAB — COMPREHENSIVE METABOLIC PANEL WITH GFR
ALT: 70 U/L — ABNORMAL HIGH (ref 0–44)
AST: 35 U/L (ref 15–41)
Albumin: 3 g/dL — ABNORMAL LOW (ref 3.5–5.0)
Alkaline Phosphatase: 286 U/L — ABNORMAL HIGH (ref 38–126)
Anion gap: 13 (ref 5–15)
BUN: 40 mg/dL — ABNORMAL HIGH (ref 6–20)
CO2: 25 mmol/L (ref 22–32)
Calcium: 9.1 mg/dL (ref 8.9–10.3)
Chloride: 100 mmol/L (ref 98–111)
Creatinine, Ser: 10.51 mg/dL — ABNORMAL HIGH (ref 0.61–1.24)
GFR, Estimated: 5 mL/min — ABNORMAL LOW (ref >60)
Glucose, Bld: 197 mg/dL — ABNORMAL HIGH (ref 70–99)
Potassium: 4.2 mmol/L (ref 3.5–5.1)
Sodium: 138 mmol/L (ref 135–145)
Total Bilirubin: 1.4 mg/dL — ABNORMAL HIGH (ref 0.0–1.2)
Total Protein: 6.5 g/dL (ref 6.5–8.1)

## 2023-12-20 LAB — CBC
HCT: 38.5 % — ABNORMAL LOW (ref 39.0–52.0)
Hemoglobin: 12.4 g/dL — ABNORMAL LOW (ref 13.0–17.0)
MCH: 28.6 pg (ref 26.0–34.0)
MCHC: 32.2 g/dL (ref 30.0–36.0)
MCV: 88.9 fL (ref 80.0–100.0)
Platelets: 222 10*3/uL (ref 150–400)
RBC: 4.33 MIL/uL (ref 4.22–5.81)
RDW: 15.1 % (ref 11.5–15.5)
WBC: 5.2 10*3/uL (ref 4.0–10.5)
nRBC: 0 % (ref 0.0–0.2)

## 2023-12-20 LAB — HEPATITIS B SURFACE ANTIBODY, QUANTITATIVE: Hep B S AB Quant (Post): <3.5 m[IU]/mL — ABNORMAL LOW

## 2023-12-20 LAB — GLUCOSE, CAPILLARY
Glucose-Capillary: 377 mg/dL — ABNORMAL HIGH (ref 70–99)
Glucose-Capillary: 268 mg/dL — ABNORMAL HIGH (ref 70–99)

## 2023-12-20 LAB — AMMONIA: Ammonia: 152 umol/L — ABNORMAL HIGH (ref 9–35)

## 2023-12-20 MED ORDER — LACTULOSE 10 GM/15ML PO SOLN
30.0000 g | Freq: Three times a day (TID) | ORAL | Status: DC
  Administered 2023-12-20 – 2023-12-23 (×8): 30 g via ORAL
  Filled 2023-12-20 (×8): qty 60

## 2023-12-20 NOTE — Consult Note (Signed)
 [facility]  CONSULT NOTE    Date: 12/20/2023                  Patient Name:  Wayne Parker  MRN: [id number]  DOB: 11-03-1965  Age / Sex: 58 y.o., male         PCP: Jama Selinda BRAVO, MD                 Service Requesting Consult: TRH                 Reason for Consult: End stage renal disease on hemodialysis.             History of Present Illness: Mr. Wayne Parker is a 58 y.o.  male with past medical conditions including hypertension, obesity, obstructive sleep apnea, cirrhosis, and end-stage renal disease on hemodialysis, who was admitted to [facility] on 12/18/2023 for Hepatic encephalopathy [facility]) [K76.82]  Patient is unable to confirm which outpatient dialysis clinic he's assigned to. Patient did receive a partial treatment on Tuesday prior to ED arrival.  Patient was receiving treatment at outpatient dialysis clinic when he began experiencing right-sided weakness with aphasia/dysarthria.  Code stroke called initially.  Patient is seen and evaluated during dialysis today.   HEMODIALYSIS FLOWSHEET:  Blood Flow Rate (mL/min): 0 mL/min Arterial Pressure (mmHg): -32.52 mmHg Venous Pressure (mmHg): 52.93 mmHg TMP (mmHg): -6.66 mmHg Ultrafiltration Rate (mL/min): 0 mL/min Dialysate Flow Rate (mL/min): 300 ml/min  Patient is alert and oriented to self only.  And able to appropriately respond to questioning.  Requires multiple redirection from staff to stay in bed and lay access arm straight to prevent dialysis machine alarms.  According to chart review, patient is a liver transplant recipient who, on admission, states he has not complied with outpatient lactulose.  Ammonia 143 on admission.  MRI of brain negative for acute infarcts.  Labs on ED arrival unremarkable for renal patient.  Respiratory panel negative for influenza, COVID-19, and RSV.   Medications: Outpatient medications: Medications Prior to Admission  Medication Sig Dispense Refill Last Dose/Taking    amLODipine (NORVASC) 10 MG tablet Take 10 mg by mouth daily.   12/17/2023 Morning   APIXABAN  (ELIQUIS ) VTE STARTER PACK (10MG  AND 5MG ) Take as directed on package: start with two-5mg  tablets twice daily for 7 days. On day 8, switch to one-5mg  tablet twice daily. 74 each 0 12/17/2023 Evening   atorvastatin (LIPITOR) 40 MG tablet Take 40 mg by mouth daily.   12/16/2023   carvedilol  (COREG ) 6.25 MG tablet Take 6.25 mg by mouth.  Take 1 tablet (6.25 mg total) by mouth two (2) times a day.   Unknown   cholecalciferol  (VITAMIN D3) 25 MCG (1000 UNIT) tablet Take 1,000 Units by mouth daily.   12/17/2023   gabapentin (NEURONTIN) 300 MG capsule Take 300 mg by mouth. Take 1 capsule (300 mg total) by mouth nightly.   12/17/2023 Morning   lanthanum (FOSRENOL) 1000 MG chewable tablet Chew 1,000 mg by mouth.  Chew 1 tablet (1,000 mg total) Three (3) times a day with a meal.   Unknown   metFORMIN (GLUCOPHAGE) 1000 MG tablet Take 1,000 mg by mouth.  Take one tablet (1,000 mg total) by mouth 2 (two) times daily with meals.   Unknown   MOUNJARO 2.5 MG/0.5ML Pen Inject into the skin.   Unknown   Multiple Vitamin (MULTIVITAMIN WITH MINERALS) TABS tablet Take 1 tablet by mouth daily.   12/18/2023 Morning   mycophenolate  (CELLCEPT ) 250  MG capsule Take 250 mg by mouth.  Take 1 capsule (250 mg total) by mouth two (2) times a day.   Unknown   oxycodone  (OXY-IR) 5 MG capsule Take 5 mg by mouth every 4 (four) hours as needed.   12/18/2023   sevelamer (RENAGEL) 800 MG tablet Take 1,600 mg by mouth 3 (three) times daily.   Taking   sevelamer carbonate (RENVELA) 800 MG tablet Take 1,600 mg by mouth 3 (three) times daily.   Unknown   tirzepatide (MOUNJARO) 7.5 MG/0.5ML Pen Inject 7.5 mg into the skin.  Inject 7.5 mg under the skin every seven (7) days.   Unknown   aspirin  EC 81 MG EC tablet Take 1 tablet (81 mg total) by mouth daily. Swallow whole. 30 tablet 11 Unknown   cloNIDine  (CATAPRES ) 0.1 MG tablet Take 0.1 mg by mouth at bedtime.       dextromethorphan -guaiFENesin  (MUCINEX  DM) 30-600 MG 12hr tablet Take 1 tablet by mouth 2 (two) times daily as needed for cough. 12 tablet 0 Unknown   furosemide  (LASIX ) 40 MG tablet Take 40-80 mg by mouth See admin instructions. Take 1 tablet (40 mg) daily. Take 2 tablets (80 mg) if you weigh >300 lbs      hydrALAZINE  (APRESOLINE ) 25 MG tablet Take 25 mg by mouth 4 (four) times daily.   Unknown   insulin  aspart (NOVOLOG  FLEXPEN) 100 UNIT/ML FlexPen Inject 10-30 Units into the skin 3 (three) times daily with meals. Per sliding scale   Unknown   insulin  detemir (LEVEMIR ) 100 UNIT/ML injection Inject 12 Units into the skin at bedtime.   Unknown   isosorbide  mononitrate (IMDUR ) 30 MG 24 hr tablet Take 30 mg by mouth daily. (Patient not taking: Reported on 12/18/2023)   Not Taking   magnesium  oxide (MAG-OX) 400 MG tablet Take 400 mg by mouth daily.   Unknown   mycophenolate  (MYFORTIC ) 180 MG EC tablet Take 180 mg by mouth 2 (two) times daily.   Unknown   tacrolimus  (PROGRAF ) 0.5 MG capsule Take 0.5 mg by mouth 2 (two) times daily.   Unknown   Testosterone  20.25 MG/ACT (1.62%) GEL Apply 1 application topically daily. Use 2 pumps once daily and apply to skin       Current medications: Current Facility-Administered Medications  Medication Dose Route Frequency Provider Last Rate Last Admin   acetaminophen  (TYLENOL ) tablet 650 mg  650 mg Oral Q6H PRN Barbarann Nest, MD       Or   acetaminophen  (TYLENOL ) suppository 650 mg  650 mg Rectal Q6H PRN Barbarann Nest, MD       apixaban  (ELIQUIS ) tablet 5 mg  5 mg Oral BID Barbarann Nest, MD   5 mg at 12/19/23 2053   carvedilol  (COREG ) tablet 12.5 mg  12.5 mg Oral BID WC Barbarann Nest, MD   12.5 mg at 12/19/23 1752   Chlorhexidine Gluconate Cloth 2 % PADS 6 each  6 each Topical Q0600 Druscilla Bald, NP   6 each at 12/20/23 9487   cloNIDine  (CATAPRES ) tablet 0.1 mg  0.1 mg Oral QHS Yates, Jennifer, MD   0.1 mg at 12/19/23 2053   hydrALAZINE   (APRESOLINE ) injection 5 mg  5 mg Intravenous Q4H PRN Barbarann Nest, MD       hydrALAZINE  (APRESOLINE ) tablet 25 mg  25 mg Oral QID Barbarann Nest, MD   25 mg at 12/19/23 2052   insulin  aspart (novoLOG ) injection 0-9 Units  0-9 Units Subcutaneous TID WC Barbarann Nest, MD   5 Units  at 12/19/23 1657   insulin  glargine-yfgn (SEMGLEE) injection 12 Units  12 Units Subcutaneous QHS Yates, Jennifer, MD   12 Units at 12/19/23 2054   lactulose (CHRONULAC) 10 GM/15ML solution 30 g  30 g Oral Q4H PRN Barbarann Nest, MD       mycophenolate  (MYFORTIC ) EC tablet 180 mg  180 mg Oral BID Barbarann Nest, MD   180 mg at 12/19/23 2054   ondansetron  (ZOFRAN ) tablet 4 mg  4 mg Oral Q6H PRN Barbarann Nest, MD       Or   ondansetron  (ZOFRAN ) injection 4 mg  4 mg Intravenous Q6H PRN Barbarann Nest, MD       tacrolimus  (PROGRAF ) capsule 1 mg  1 mg Oral BID Barbarann Nest, MD   1 mg at 12/19/23 2053      Allergies: Allergies  Allergen Reactions   Insulin  Aspart Prot & Aspart Other (See Comments)    Burning on injection and skin reaction.        Past Medical History: Past Medical History:  Diagnosis Date   Chronic kidney disease    Cirrhosis (HCC)    Heart murmur    Hypertension    Sepsis (HCC)    Sleep apnea      Past Surgical History: Past Surgical History:  Procedure Laterality Date   LIVER TRANSPLANT     NO PAST SURGERIES       Family History: History reviewed. No pertinent family history.   Social History: Social History   Socioeconomic History   Marital status: Single    Spouse name: Not on file   Number of children: Not on file   Years of education: Not on file   Highest education level: Not on file  Occupational History   Not on file  Tobacco Use   Smoking status: Never   Smokeless tobacco: Never   Tobacco comments:    quit smoking 2002  Vaping Use   Vaping status: Never Used  Substance and Sexual Activity   Alcohol use: No   Drug use: Not on file   Sexual  activity: Not Currently  Other Topics Concern   Not on file  Social History Narrative   Not on file   Social Drivers of Health   Financial Resource Strain: Low Risk  (11/17/2022)   Received from [facility]   Overall Financial Resource Strain (CARDIA)    Difficulty of Paying Living Expenses: Not very hard  Food Insecurity: No Food Insecurity (12/18/2023)   Hunger Vital Sign    Worried About Running Out of Food in the Last Year: Never true    Ran Out of Food in the Last Year: Never true  Transportation Needs: No Transportation Needs (12/18/2023)   PRAPARE - Administrator, Civil Service (Medical): No    Lack of Transportation (Non-Medical): No  Physical Activity: Insufficiently Active (07/29/2020)   Received from [facility] visits prior to 08/26/2022.   Exercise Vital Sign    On average, how many days per week do you engage in moderate to strenuous exercise (like a brisk walk)?: 2 days    On average, how many minutes do you engage in exercise at this level?: 20 min  Stress: No Stress Concern Present (07/29/2020)   Received from [facility] visits prior to 08/26/2022.   [employer] of Occupational Health - Occupational Stress Questionnaire    Feeling of Stress : Not at all  Social Connections: Unknown (07/10/2022)  Received from [facility]   Social Network    Social Network: Not on file  Intimate Partner Violence: Not At Risk (12/18/2023)   Humiliation, Afraid, Rape, and Kick questionnaire    Fear of Current or Ex-Partner: No    Emotionally Abused: No    Physically Abused: No    Sexually Abused: No     Review of Systems: Review of Systems  Unable to perform ROS: Mental status change    Vital Signs: Blood pressure 100/64, pulse 77, temperature 98 F (36.7 C), temperature source Oral, resp. rate 19, height 5' 9 (1.753 m), weight 122.6 kg, SpO2 100%.  Weight trends: Filed Weights   12/18/23 1051 12/20/23 0742   Weight: 131.1 kg 122.6 kg    Physical Exam: General: NAD, restless  Head: Normocephalic, atraumatic. Moist oral mucosal membranes  Eyes: Anicteric  Lungs:  Clear to auscultation, normal effort  Heart: Regular rate and rhythm  Abdomen:  Soft, nontender, obese  Extremities: No peripheral edema.  Neurologic: Alert and oriented to self  Skin: No lesions  Access: Right AVF     Lab results: Basic Metabolic Panel: Recent Labs  Lab 12/16/23 2214 12/18/23 1040 12/19/23 0350 12/20/23 0338  NA 137 139 135 138  K 4.9 3.8 4.3 4.2  CL 98 99 98 100  CO2 22 25 24 25   GLUCOSE 189* 230* 303* 197*  BUN 22* 20 31* 40*  CREATININE 8.04* 6.03* 8.03* 10.51*  CALCIUM 10.5* 9.4 9.5 9.1  MG 2.4  --   --   --     Liver Function Tests: Recent Labs  Lab 12/16/23 2214 12/18/23 1040 12/20/23 0338  AST 182* 41 35  ALT 129* 85* 70*  ALKPHOS 441* 307* 286*  BILITOT 0.9 1.3* 1.4*  PROT 7.8 7.3 6.5  ALBUMIN 3.8 3.7 3.0*   No results for input(s): LIPASE, AMYLASE in the last 168 hours. Recent Labs  Lab 12/18/23 1120 12/20/23 0338  AMMONIA 143* 152*    CBC: Recent Labs  Lab 12/16/23 2214 12/18/23 1040 12/19/23 0350 12/20/23 0338  WBC 7.2 8.5 5.6 5.2  NEUTROABS  --  6.1  --   --   HGB 14.6 14.3 12.7* 12.4*  HCT 44.4 42.8 39.3 38.5*  MCV 90.8 88.6 89.5 88.9  PLT 265 268 235 222    Cardiac Enzymes: No results for input(s): CKTOTAL, CKMB, CKMBINDEX, TROPONINI in the last 168 hours.  BNP: Invalid input(s): POCBNP  CBG: Recent Labs  Lab 12/18/23 2133 12/19/23 0817 12/19/23 1317 12/19/23 1742 12/19/23 2051  GLUCAP 192* 231* 294* 377* 220*    Microbiology: Results for orders placed or performed during the hospital encounter of 12/18/23  Resp panel by RT-PCR (RSV, Flu A&B, Covid) Anterior Nasal Swab     Status: None   Collection Time: 12/18/23 11:56 AM   Specimen: Anterior Nasal Swab  Result Value Ref Range Status   SARS Coronavirus 2 by RT PCR NEGATIVE  NEGATIVE Final    Comment: (NOTE) SARS-CoV-2 target nucleic acids are NOT DETECTED.  The SARS-CoV-2 RNA is generally detectable in upper respiratory specimens during the acute phase of infection. The lowest concentration of SARS-CoV-2 viral copies this assay can detect is 138 copies/mL. A negative result does not preclude SARS-Cov-2 infection and should not be used as the sole basis for treatment or other patient management decisions. A negative result may occur with  improper specimen collection/handling, submission of specimen other than nasopharyngeal swab, presence of viral mutation(s) within the areas targeted by this  assay, and inadequate number of viral copies(<138 copies/mL). A negative result must be combined with clinical observations, patient history, and epidemiological information. The expected result is Negative.  Fact Sheet for Patients:  BloggerCourse.com  Fact Sheet for Healthcare Providers:  SeriousBroker.it  This test is no t yet approved or cleared by the [place]  FDA and  has been authorized for detection and/or diagnosis of SARS-CoV-2 by FDA under an Emergency Use Authorization (EUA). This EUA will remain  in effect (meaning this test can be used) for the duration of the COVID-19 declaration under Section 564(b)(1) of the Act, 21 U.S.C.section 360bbb-3(b)(1), unless the authorization is terminated  or revoked sooner.       Influenza A by PCR NEGATIVE NEGATIVE Final   Influenza B by PCR NEGATIVE NEGATIVE Final    Comment: (NOTE) The Xpert Xpress SARS-CoV-2/FLU/RSV plus assay is intended as an aid in the diagnosis of influenza from Nasopharyngeal swab specimens and should not be used as a sole basis for treatment. Nasal washings and aspirates are unacceptable for Xpert Xpress SARS-CoV-2/FLU/RSV testing.  Fact Sheet for Patients: BloggerCourse.com  Fact Sheet for Healthcare  Providers: SeriousBroker.it  This test is not yet approved or cleared by the [place]  FDA and has been authorized for detection and/or diagnosis of SARS-CoV-2 by FDA under an Emergency Use Authorization (EUA). This EUA will remain in effect (meaning this test can be used) for the duration of the COVID-19 declaration under Section 564(b)(1) of the Act, 21 U.S.C. section 360bbb-3(b)(1), unless the authorization is terminated or revoked.     Resp Syncytial Virus by PCR NEGATIVE NEGATIVE Final    Comment: (NOTE) Fact Sheet for Patients: BloggerCourse.com  Fact Sheet for Healthcare Providers: SeriousBroker.it  This test is not yet approved or cleared by the [place]  FDA and has been authorized for detection and/or diagnosis of SARS-CoV-2 by FDA under an Emergency Use Authorization (EUA). This EUA will remain in effect (meaning this test can be used) for the duration of the COVID-19 declaration under Section 564(b)(1) of the Act, 21 U.S.C. section 360bbb-3(b)(1), unless the authorization is terminated or revoked.  Performed at [facility], [address] [place], [place] [address]     Coagulation Studies: No results for input(s): LABPROT, INR in the last 72 hours.  Urinalysis: No results for input(s): COLORURINE, LABSPEC, PHURINE, GLUCOSEU, HGBUR, BILIRUBINUR, KETONESUR, PROTEINUR, UROBILINOGEN, NITRITE, LEUKOCYTESUR in the last 72 hours.  Invalid input(s): APPERANCEUR    Imaging: EEG adult Result Date: 12/19/2023 Shelton Arlin KIDD, MD     12/19/2023  3:38 PM Patient Name: Omari Mcmanaway MRN: [id number] Epilepsy Attending: Arlin KIDD Shelton Referring Physician/Provider: Merrianne Locus, MD Date: 12/19/2023 Duration: 39.11 mins Patient history: 57yo m with ams. EEG to evaluate for seizure. Level of alertness: Awake AEDs during EEG study: None Technical aspects:  This EEG study was done with scalp electrodes positioned according to the 10-20 International system of electrode placement. Electrical activity was reviewed with band pass filter of 1-70Hz , sensitivity of 7 uV/mm, display speed of 44mm/sec with a 60Hz  notched filter applied as appropriate. EEG data were recorded continuously and digitally stored.  Video monitoring was available and reviewed as appropriate. Description: EEG showed continuous generalized 3 to 6 Hz theta-delta slowing. Intermittent 13-15hz  beta activity was noted in fronto-central region. Hyperventilation and photic stimulation were not performed.   ABNORMALITY - Continuous slow, generalized IMPRESSION: This study is suggestive of moderate diffuse encephalopathy. No seizures or epileptiform discharges were seen throughout the recording. Priyanka O Yadav  MR BRAIN WO CONTRAST Result Date: 12/19/2023 CLINICAL DATA:  Follow-up examination for stroke. EXAM: MRI HEAD WITHOUT CONTRAST TECHNIQUE: Multiplanar, multiecho pulse sequences of the brain and surrounding structures were obtained without intravenous contrast. COMPARISON:  Prior CTs from 12/18/2023. FINDINGS: Brain: Cerebral volume within normal limits for age. Minimal patchy T2/FLAIR signal abnormality noted involving the right posterior periventricular white matter, nonspecific, but most commonly related to chronic microvascular ischemic disease, overall minimal in nature and less than is typically seen for age. No abnormal foci of restricted diffusion to suggest acute or subacute ischemia. Gray-white matter differentiation maintained. No areas of chronic cortical infarction. No acute or chronic intracranial blood products. No mass lesion, midline shift or mass effect. No hydrocephalus or extra-axial fluid collection. Pituitary gland and suprasellar region within normal limits. Vascular: Major intracranial vascular flow voids are maintained. Skull and upper cervical spine: Craniocervical  junction within normal limits. Decreased T1 signal intensity noted within the bone marrow the visualized upper cervical spine, likely related history of end-stage renal disease and anemia. Sinuses/Orbits: Globes orbital soft tissues within normal limits. Paranasal sinuses are largely clear. Trace bilateral mastoid effusions noted, bowel significance. Other: None. IMPRESSION: Normal brain MRI for age. No acute intracranial abnormality. Electronically Signed   By: Morene Hoard M.D.   On: 12/19/2023 02:20   DG Chest Portable 1 View Result Date: 12/18/2023 CLINICAL DATA:  Altered mental status. EXAM: PORTABLE CHEST 1 VIEW COMPARISON:  December 16, 2023 FINDINGS: Stable cardiomegaly. Both lungs are clear. The visualized skeletal structures are unremarkable. IMPRESSION: No active disease. Electronically Signed   By: Lynwood Landy Raddle M.D.   On: 12/18/2023 13:16     Assessment & Plan: Mr. Deontaye Civello is a 58 y.o.  male with past medical conditions including hypertension, obesity, obstructive sleep apnea, cirrhosis, and end-stage renal disease on hemodialysis, who was admitted to [facility] on 12/18/2023 for Hepatic encephalopathy (HCC) [K76.82]  End stage renal disease on hemodialysis. Last treatment received on Tuesday, received 1.5hr of treatment prior to ED arrival. Receiving dialysis today, UF 1-1.5L as tolerated. Due to agitation, treatment terminated 14 min early. Next treatment scheduled for Saturday.   2.  Hepatic encephalopathy, ammonia 143 on admission.  Reported liver transplant 2013 with little follow-up.  EEG shows moderate diffuse encephalopathy.  3. Anemia of chronic kidney disease Lab Results  Component Value Date   HGB 12.4 (L) 12/20/2023    Hemoglobin acceptable at this time.  4. Secondary Hyperparathyroidism: with outpatient labs: Unknown  Lab Results  Component Value Date   CALCIUM 9.1 12/20/2023    Will continue to monitor bone minerals during this admission.  LOS:  1 Whisper Kurka 6/26/202511:35 AM

## 2023-12-20 NOTE — Progress Notes (Signed)
 PROGRESS NOTE    Wayne Parker  FMW:[id number] DOB: 1966/03/01 DOA: 12/18/2023 PCP: Jama Selinda BRAVO, MD   Assessment & Plan:   Principal Problem:   Hepatic encephalopathy [facility]) Active Problems:   Hypertension   Type II diabetes mellitus with renal manifestations (HCC)   History of liver transplant (HCC)   ESRD on dialysis (HCC)   DVT (deep venous thrombosis) (HCC)   Paroxysmal atrial fibrillation (HCC)  Assessment and Plan: Hepatic encephalopathy: w/ mental status still waxing and waning. W/ prior liver transplant & w/ concern for recurrent cirrhosis but has not been dx w/ recurrent cirrhosis as per pt's sister. Possibly secondary elevated ammonia. Was not taking lactulose at home and has not seen GI specialist since transplant in 2013 as per pt. MRI brain & CT head shows no acute intracranial abnormalities. EEG showed no seizure or epileptiform discharges. Neuro following and recs apprec  Hx liver transplant: continue on home dose of mycophenolate , tacrolimus      ESRD: on HD TTS. HD was stopped approx 14 early b/c pt was very confused & trying to climb out of bed. Nephro following and recs apprec    HTN: continue on clonidine , coreg , hydralazine .    Hx of CAD: continue on imdur , coreg , hydralazine . Holding home aspirin    DM2: poorly controlled, HbA1c 11.8. Continue on glargine, SSI w/ accuchecks    DVT: continue on eliquis     Likely PAF: continue on eliquis , coreg     Morbid obesity: BMI 42.6. Complicates overall care & prognosis       DVT prophylaxis: eliquis  Code Status: full  Family Communication: discussed pt's care w/ pt's sister, Wayne Parker, and answered her questions  Disposition Plan: PT/OT needs to see.  Level of care: Med-Surg  Status is: Inpatient Remains inpatient appropriate because: severity of illness      Consultants:  Neuro   Procedures:   Antimicrobials:    Subjective: Pt c/o fatigue   Objective: Vitals:   12/20/23 0800 12/20/23 0830  12/20/23 0845 12/20/23 0900  BP: 106/68 (!) 89/68 (!) 77/59 (!) 83/42  Pulse: (!) 53 62 60 (!) 51  Resp: (!) 24 19 18 18   Temp:      TempSrc:      SpO2: 100% 96% 97% 100%  Weight:      Height:        Intake/Output Summary (Last 24 hours) at 12/20/2023 0908 Last data filed at 12/19/2023 2048 Gross per 24 hour  Intake 120 ml  Output --  Net 120 ml   Filed Weights   12/18/23 1051 12/20/23 0742  Weight: 131.1 kg 122.6 kg    Examination:  General exam: Appears uncomfortable  Respiratory system: decreased breath sounds b/l  Cardiovascular system: S1/S2+. No rubs or clicks  Gastrointestinal system: Abd is soft, NT, obese & hypoactive bowel sounds  Central nervous system: awake. Moves all extremities  Psychiatry: Judgement and insight appears not at baseline. Flat mood and affect    Data Reviewed: I have personally reviewed following labs and imaging studies  CBC: Recent Labs  Lab 12/16/23 2214 12/18/23 1040 12/19/23 0350 12/20/23 0338  WBC 7.2 8.5 5.6 5.2  NEUTROABS  --  6.1  --   --   HGB 14.6 14.3 12.7* 12.4*  HCT 44.4 42.8 39.3 38.5*  MCV 90.8 88.6 89.5 88.9  PLT 265 268 235 222   Basic Metabolic Panel: Recent Labs  Lab 12/16/23 2214 12/18/23 1040 12/19/23 0350 12/20/23 0338  NA 137 139 135 138  K 4.9 3.8  4.3 4.2  CL 98 99 98 100  CO2 22 25 24 25   GLUCOSE 189* 230* 303* 197*  BUN 22* 20 31* 40*  CREATININE 8.04* 6.03* 8.03* 10.51*  CALCIUM 10.5* 9.4 9.5 9.1  MG 2.4  --   --   --    GFR: Estimated Creatinine Clearance: 10.3 mL/min (A) (by C-G formula based on SCr of 10.51 mg/dL (H)). Liver Function Tests: Recent Labs  Lab 12/16/23 2214 12/18/23 1040 12/20/23 0338  AST 182* 41 35  ALT 129* 85* 70*  ALKPHOS 441* 307* 286*  BILITOT 0.9 1.3* 1.4*  PROT 7.8 7.3 6.5  ALBUMIN 3.8 3.7 3.0*   No results for input(s): LIPASE, AMYLASE in the last 168 hours. Recent Labs  Lab 12/18/23 1120 12/20/23 0338  AMMONIA 143* 152*   Coagulation  Profile: No results for input(s): INR, PROTIME in the last 168 hours. Cardiac Enzymes: No results for input(s): CKTOTAL, CKMB, CKMBINDEX, TROPONINI in the last 168 hours. BNP (last 3 results) No results for input(s): PROBNP in the last 8760 hours. HbA1C: No results for input(s): HGBA1C in the last 72 hours. CBG: Recent Labs  Lab 12/18/23 2133 12/19/23 0817 12/19/23 1317 12/19/23 1742 12/19/23 2051  GLUCAP 192* 231* 294* 377* 220*   Lipid Profile: No results for input(s): CHOL, HDL, LDLCALC, TRIG, CHOLHDL, LDLDIRECT in the last 72 hours. Thyroid Function Tests: Recent Labs    12/18/23 2128  TSH 2.732   Anemia Panel: Recent Labs    12/18/23 2128  VITAMINB12 818   Sepsis Labs: Recent Labs  Lab 12/17/23 0025  LATICACIDVEN 2.4*    Recent Results (from the past 240 hours)  Resp panel by RT-PCR (RSV, Flu A&B, Covid) Anterior Nasal Swab     Status: None   Collection Time: 12/18/23 11:56 AM   Specimen: Anterior Nasal Swab  Result Value Ref Range Status   SARS Coronavirus 2 by RT PCR NEGATIVE NEGATIVE Final    Comment: (NOTE) SARS-CoV-2 target nucleic acids are NOT DETECTED.  The SARS-CoV-2 RNA is generally detectable in upper respiratory specimens during the acute phase of infection. The lowest concentration of SARS-CoV-2 viral copies this assay can detect is 138 copies/mL. A negative result does not preclude SARS-Cov-2 infection and should not be used as the sole basis for treatment or other patient management decisions. A negative result may occur with  improper specimen collection/handling, submission of specimen other than nasopharyngeal swab, presence of viral mutation(s) within the areas targeted by this assay, and inadequate number of viral copies(<138 copies/mL). A negative result must be combined with clinical observations, patient history, and epidemiological information. The expected result is Negative.  Fact Sheet for  Patients:  BloggerCourse.com  Fact Sheet for Healthcare Providers:  SeriousBroker.it  This test is no t yet approved or cleared by the [place]  FDA and  has been authorized for detection and/or diagnosis of SARS-CoV-2 by FDA under an Emergency Use Authorization (EUA). This EUA will remain  in effect (meaning this test can be used) for the duration of the COVID-19 declaration under Section 564(b)(1) of the Act, 21 U.S.C.section 360bbb-3(b)(1), unless the authorization is terminated  or revoked sooner.       Influenza A by PCR NEGATIVE NEGATIVE Final   Influenza B by PCR NEGATIVE NEGATIVE Final    Comment: (NOTE) The Xpert Xpress SARS-CoV-2/FLU/RSV plus assay is intended as an aid in the diagnosis of influenza from Nasopharyngeal swab specimens and should not be used as a sole basis for treatment. Nasal  washings and aspirates are unacceptable for Xpert Xpress SARS-CoV-2/FLU/RSV testing.  Fact Sheet for Patients: BloggerCourse.com  Fact Sheet for Healthcare Providers: SeriousBroker.it  This test is not yet approved or cleared by the [place]  FDA and has been authorized for detection and/or diagnosis of SARS-CoV-2 by FDA under an Emergency Use Authorization (EUA). This EUA will remain in effect (meaning this test can be used) for the duration of the COVID-19 declaration under Section 564(b)(1) of the Act, 21 U.S.C. section 360bbb-3(b)(1), unless the authorization is terminated or revoked.     Resp Syncytial Virus by PCR NEGATIVE NEGATIVE Final    Comment: (NOTE) Fact Sheet for Patients: BloggerCourse.com  Fact Sheet for Healthcare Providers: SeriousBroker.it  This test is not yet approved or cleared by the [place]  FDA and has been authorized for detection and/or diagnosis of SARS-CoV-2 by FDA under an Emergency  Use Authorization (EUA). This EUA will remain in effect (meaning this test can be used) for the duration of the COVID-19 declaration under Section 564(b)(1) of the Act, 21 U.S.C. section 360bbb-3(b)(1), unless the authorization is terminated or revoked.  Performed at [facility], [address], [place], [place] [address]          Radiology Studies: EEG adult Result Date: 12/19/2023 Shelton Arlin KIDD, MD     12/19/2023  3:38 PM Patient Name: Wayne Parker MRN: [id number] Epilepsy Attending: Arlin KIDD Shelton Referring Physician/Provider: Merrianne Locus, MD Date: 12/19/2023 Duration: 39.11 mins Patient history: 57yo m with ams. EEG to evaluate for seizure. Level of alertness: Awake AEDs during EEG study: None Technical aspects: This EEG study was done with scalp electrodes positioned according to the 10-20 International system of electrode placement. Electrical activity was reviewed with band pass filter of 1-70Hz , sensitivity of 7 uV/mm, display speed of 74mm/sec with a 60Hz  notched filter applied as appropriate. EEG data were recorded continuously and digitally stored.  Video monitoring was available and reviewed as appropriate. Description: EEG showed continuous generalized 3 to 6 Hz theta-delta slowing. Intermittent 13-15hz  beta activity was noted in fronto-central region. Hyperventilation and photic stimulation were not performed.   ABNORMALITY - Continuous slow, generalized IMPRESSION: This study is suggestive of moderate diffuse encephalopathy. No seizures or epileptiform discharges were seen throughout the recording. Arlin KIDD Shelton   MR BRAIN WO CONTRAST Result Date: 12/19/2023 CLINICAL DATA:  Follow-up examination for stroke. EXAM: MRI HEAD WITHOUT CONTRAST TECHNIQUE: Multiplanar, multiecho pulse sequences of the brain and surrounding structures were obtained without intravenous contrast. COMPARISON:  Prior CTs from 12/18/2023. FINDINGS: Brain: Cerebral volume within normal  limits for age. Minimal patchy T2/FLAIR signal abnormality noted involving the right posterior periventricular white matter, nonspecific, but most commonly related to chronic microvascular ischemic disease, overall minimal in nature and less than is typically seen for age. No abnormal foci of restricted diffusion to suggest acute or subacute ischemia. Gray-white matter differentiation maintained. No areas of chronic cortical infarction. No acute or chronic intracranial blood products. No mass lesion, midline shift or mass effect. No hydrocephalus or extra-axial fluid collection. Pituitary gland and suprasellar region within normal limits. Vascular: Major intracranial vascular flow voids are maintained. Skull and upper cervical spine: Craniocervical junction within normal limits. Decreased T1 signal intensity noted within the bone marrow the visualized upper cervical spine, likely related history of end-stage renal disease and anemia. Sinuses/Orbits: Globes orbital soft tissues within normal limits. Paranasal sinuses are largely clear. Trace bilateral mastoid effusions noted, bowel significance. Other: None. IMPRESSION: Normal brain MRI for age. No acute  intracranial abnormality. Electronically Signed   By: Morene Hoard M.D.   On: 12/19/2023 02:20   DG Chest Portable 1 View Result Date: 12/18/2023 CLINICAL DATA:  Altered mental status. EXAM: PORTABLE CHEST 1 VIEW COMPARISON:  December 16, 2023 FINDINGS: Stable cardiomegaly. Both lungs are clear. The visualized skeletal structures are unremarkable. IMPRESSION: No active disease. Electronically Signed   By: Lynwood Landy Raddle M.D.   On: 12/18/2023 13:16   CT ANGIO HEAD NECK W WO CM (CODE STROKE) Result Date: 12/18/2023 CLINICAL DATA:  Acute neurological deficit. EXAM: CT ANGIOGRAPHY HEAD AND NECK WITH AND WITHOUT CONTRAST TECHNIQUE: Multidetector CT imaging of the head and neck was performed using the standard protocol during bolus administration of intravenous  contrast. Multiplanar CT image reconstructions and MIPs were obtained to evaluate the vascular anatomy. Carotid stenosis measurements (when applicable) are obtained utilizing NASCET criteria, using the distal internal carotid diameter as the denominator. RADIATION DOSE REDUCTION: This exam was performed according to the departmental dose-optimization program which includes automated exposure control, adjustment of the mA and/or kV according to patient size and/or use of iterative reconstruction technique. CONTRAST:  75mL OMNIPAQUE  IOHEXOL  350 MG/ML SOLN COMPARISON:  CT of the head dated December 16, 2023. FINDINGS: CT HEAD FINDINGS Brain: Normal. No evidence of hemorrhage, mass, acute cortical infarct or hydrocephalus. Vascular: Unremarkable. Skull: Intact and unremarkable. Sinuses/Orbits: Normal. Other: None. Review of the MIP images confirms the above findings CTA NECK FINDINGS Aortic arch: Aberrant origin of the right subclavian artery. Normal in caliber and unremarkable in appearance otherwise. Right carotid system: Minimal calcification within the carotid bulb. Normal otherwise. Left carotid system: Minimal calcific plaque in the left carotid bulb. Normal otherwise. Vertebral arteries: Left vertebral artery is dominant and right is relatively hypoplastic. Both are uniform in caliber throughout the respective courses. Skeleton: Mild degenerative changes within the cervical spine. No osseous lesions. Other neck: Negative Upper chest: The lung apices are clear. Review of the MIP images confirms the above findings CTA HEAD FINDINGS Anterior circulation: Mild calcific atheromatous disease within the carotid siphons. No significant stenosis. Absent or hypoplastic left A1 segment. Anterior cerebral arteries are otherwise normal in caliber and unremarkable. The middle cerebral arteries and their branches demonstrate no evidence of aneurysm, large vessel occlusion or flow-limiting stenosis. Posterior circulation: The  basilar artery is normal in caliber and unremarkable in appearance. The cerebellar and posterior cerebral arteries and proximal branches are normal in caliber and unremarkable. Venous sinuses: The venous sinuses appear to be widely patent. Anatomic variants: Aplastic or hypoplastic left A1 segment. Review of the MIP images confirms the above findings IMPRESSION: 1. Negative CT angiogram of the head and neck. These results were called by telephone at the time of interpretation on 12/18/2023 at 11:03 am to provider ERIC [facility] , who verbally acknowledged these results. Electronically Signed   By: Evalene Coho M.D.   On: 12/18/2023 11:08   CT HEAD CODE STROKE WO CONTRAST` Result Date: 12/18/2023 CLINICAL DATA:  Code stroke.  Acute neurological deficit. EXAM: CT HEAD WITHOUT CONTRAST TECHNIQUE: Contiguous axial images were obtained from the base of the skull through the vertex without intravenous contrast. RADIATION DOSE REDUCTION: This exam was performed according to the departmental dose-optimization program which includes automated exposure control, adjustment of the mA and/or kV according to patient size and/or use of iterative reconstruction technique. COMPARISON:  CT of the head dated December 16, 2023. FINDINGS: Brain: On image 20 of series 4, there is questionable loss of the gray-white junction in  the right posterior frontal lobe on a single slice. There is no evidence of acute infarction involving the left cerebral hemisphere. The brain otherwise appears normal. No evidence of hemorrhage, mass or hydrocephalus. Vascular: Mild vascular calcifications. Skull: Intact.  No bony lesions present. Sinuses/Orbits: Negative. Other: None. ASPECTS [facility] Stroke Program Early CT Score) - Ganglionic level infarction (caudate, lentiform nuclei, internal capsule, insula, M1-M3 cortex): 7 - Supraganglionic infarction (M4-M6 cortex): 2 Total score (0-10 with 10 being normal): 9 IMPRESSION: 1. Questionable loss of  gray-white junction in the right posterior frontal lobe. This may represent volume averaging, however. No evidence of infarction involving the left cerebral hemisphere. 2. ASPECTS is 9 3. These results were called by telephone at the time of interpretation on 12/18/2023 at 10:46 am to provider Dr. Merrianne, who verbally acknowledged these results. Electronically Signed   By: Evalene Coho M.D.   On: 12/18/2023 10:59        Scheduled Meds:  apixaban   5 mg Oral BID   carvedilol   12.5 mg Oral BID WC   Chlorhexidine Gluconate Cloth  6 each Topical Q0600   cloNIDine   0.1 mg Oral QHS   hydrALAZINE   25 mg Oral QID   insulin  aspart  0-9 Units Subcutaneous TID WC   insulin  glargine-yfgn  12 Units Subcutaneous QHS   mycophenolate   180 mg Oral BID   tacrolimus   1 mg Oral BID   Continuous Infusions:   LOS: 1 day      Jeoffrey CHRISTELLA Pouch, MD [facility] Pager [phone]  If 7PM-7AM, please contact night-coverage [email or web address] 12/20/2023, 9:08 AM  '

## 2023-12-20 NOTE — Progress Notes (Signed)
 Hemodialysis Note:  Received patient in bed to unit. Alert and oriented. Informed consent singed and in chart.  Treatment initiated: 0757 Treatment completed: 1100  Access used: Right AVF Access issues: None  Rinse back 40 minutes early due to hypotension and patient became irritable.Transported back to room, awake and alert. Report given to patient's RN.  Total UF removed: 700 ml Medications given: None  Post HD weight: 121.9 kg  [facility]

## 2023-12-20 NOTE — Plan of Care (Signed)
 EEG with continuous generalized slowing. The study is suggestive of moderate diffuse encephalopathy. No seizures or epileptiform discharges were seen throughout the recording.  Work up is complete from a neurological standpoint.   Neurology will sign off. Please call if there are additional questions.   Electronically signed: Dr. Ladon Heney

## 2023-12-21 ENCOUNTER — Inpatient Hospital Stay

## 2023-12-21 DIAGNOSIS — K7682 Hepatic encephalopathy: Secondary | ICD-10-CM | POA: Diagnosis not present

## 2023-12-21 LAB — COMPREHENSIVE METABOLIC PANEL WITH GFR
ALT: 53 U/L — ABNORMAL HIGH (ref 0–44)
AST: 24 U/L (ref 15–41)
Albumin: 3.1 g/dL — ABNORMAL LOW (ref 3.5–5.0)
Alkaline Phosphatase: 258 U/L — ABNORMAL HIGH (ref 38–126)
Anion gap: 14 (ref 5–15)
BUN: 35 mg/dL — ABNORMAL HIGH (ref 6–20)
CO2: 26 mmol/L (ref 22–32)
Calcium: 9.3 mg/dL (ref 8.9–10.3)
Chloride: 99 mmol/L (ref 98–111)
Creatinine, Ser: 8.43 mg/dL — ABNORMAL HIGH (ref 0.61–1.24)
GFR, Estimated: 7 mL/min — ABNORMAL LOW (ref >60)
Glucose, Bld: 176 mg/dL — ABNORMAL HIGH (ref 70–99)
Potassium: 3.7 mmol/L (ref 3.5–5.1)
Sodium: 139 mmol/L (ref 135–145)
Total Bilirubin: 1.5 mg/dL — ABNORMAL HIGH (ref 0.0–1.2)
Total Protein: 6.9 g/dL (ref 6.5–8.1)

## 2023-12-21 LAB — GLUCOSE, CAPILLARY
Glucose-Capillary: 198 mg/dL — ABNORMAL HIGH (ref 70–99)
Glucose-Capillary: 217 mg/dL — ABNORMAL HIGH (ref 70–99)
Glucose-Capillary: 166 mg/dL — ABNORMAL HIGH (ref 70–99)
Glucose-Capillary: 306 mg/dL — ABNORMAL HIGH (ref 70–99)
Glucose-Capillary: 404 mg/dL — ABNORMAL HIGH (ref 70–99)
Glucose-Capillary: 362 mg/dL — ABNORMAL HIGH (ref 70–99)

## 2023-12-21 LAB — CBC
HCT: 40.9 % (ref 39.0–52.0)
Hemoglobin: 13.1 g/dL (ref 13.0–17.0)
MCH: 28.6 pg (ref 26.0–34.0)
MCHC: 32 g/dL (ref 30.0–36.0)
MCV: 89.3 fL (ref 80.0–100.0)
Platelets: 255 10*3/uL (ref 150–400)
RBC: 4.58 MIL/uL (ref 4.22–5.81)
RDW: 14.9 % (ref 11.5–15.5)
WBC: 5.8 10*3/uL (ref 4.0–10.5)
nRBC: 0 % (ref 0.0–0.2)

## 2023-12-21 LAB — AMMONIA: Ammonia: 46 umol/L — ABNORMAL HIGH (ref 9–35)

## 2023-12-21 MED ORDER — GABAPENTIN 300 MG PO CAPS
300.0000 mg | ORAL_CAPSULE | Freq: Every day | ORAL | Status: DC
  Administered 2023-12-21 – 2023-12-22 (×2): 300 mg via ORAL
  Filled 2023-12-21 (×2): qty 1

## 2023-12-21 MED ORDER — ALUM & MAG HYDROXIDE-SIMETH 200-200-20 MG/5ML PO SUSP
30.0000 mL | ORAL | Status: DC | PRN
  Administered 2023-12-21 (×3): 30 mL via ORAL
  Filled 2023-12-21 (×2): qty 30

## 2023-12-21 MED ORDER — SODIUM CHLORIDE 0.9 % IV BOLUS
500.0000 mL | Freq: Once | INTRAVENOUS | Status: AC
  Administered 2023-12-21: 500 mL via INTRAVENOUS

## 2023-12-21 NOTE — Unmapped (Signed)
 Patient requesting follow-up with liver team (also due for annual) as he anticipates discharge from local hospital.  Secured next available appt with PA Minnie (previously seen by NP Harms) on 6/30 with labs.

## 2023-12-21 NOTE — Unmapped (Signed)
 Patient paged on call coordinator to make team aware he is admitted at [facility].  He has been there since Tuesday-sounded a bit confused, but just wanted team at [facility] to know he was admitted. Routed to primary coordinator.

## 2023-12-21 NOTE — Progress Notes (Signed)
 PROGRESS NOTE    Wayne Parker  FMW:[id number] DOB: [birth year] DOA: 12/18/2023 PCP: Jama Selinda BRAVO, MD   Assessment & Plan:   Principal Problem:   Hepatic encephalopathy [facility]) Active Problems:   Hypertension   Type II diabetes mellitus with renal manifestations (HCC)   History of liver transplant (HCC)   ESRD on dialysis (HCC)   DVT (deep venous thrombosis) (HCC)   Paroxysmal atrial fibrillation (HCC)  Assessment and Plan: Hepatic encephalopathy: w/ mental status has improved today. W/ prior liver transplant & w/ concern for recurrent cirrhosis but has not been dx w/ recurrent cirrhosis as per pt's sister. Ammonia level is trending down. Was not taking lactulose  at home and has not seen GI specialist since transplant in 2013 as per pt. MRI brain & CT head shows no acute intracranial abnormalities. EEG showed no seizure or epileptiform discharges. Neuro following and recs apprec  Hx liver transplant: continue on home dose of tacrolimus , mycophenolate . CT abd/pelvis to evaluate liver   ESRD: on HD TTS. Nephro following and recs apprec   HTN: continue on coreg , clonidine , hydralazine    Hx of CAD: continue on coreg , imdur , hydralazine . Holding home aspirin   DM2: poorly controlled, HbA1c 11.8. Continue on glargine, SSI w/ accuchecks   DVT: continue on eliquis    Likely PAF: continue on coreg , eliquis     Morbid obesity: BMI 42.6. Complicates overall care & prognosis       DVT prophylaxis: eliquis  Code Status: full  Family Communication: discussed pt's care w/ pt's sister, Wayne Parker, and answered her questions  Disposition Plan: PT/OT needs to see.  Level of care: Med-Surg  Status is: Inpatient Remains inpatient appropriate because: severity of illness      Consultants:  Neuro   Procedures:   Antimicrobials:    Subjective: Pt c/o malaise   Objective: Vitals:   12/20/23 2024 12/20/23 2154 12/21/23 0453 12/21/23 0804  BP: (!) 92/53 (!) 108/58 112/65 115/65  Pulse:  71 75 64 77  Resp: 16  16 18   Temp: 97.7 F (36.5 C)  98.3 F (36.8 C) 98.5 F (36.9 C)  TempSrc: Oral  Oral Oral  SpO2: 100% 100% 97% 97%  Weight:      Height:        Intake/Output Summary (Last 24 hours) at 12/21/2023 0958 Last data filed at 12/20/2023 1100 Gross per 24 hour  Intake --  Output 700 ml  Net -700 ml   Filed Weights   12/18/23 1051 12/20/23 0742  Weight: 131.1 kg 122.6 kg    Examination:  General exam: appears calm & comfortable Respiratory system: diminished breath sounds b/l  Cardiovascular system: S1 & S2+. No rubs & clicks Gastrointestinal system: abd is soft, NT, obese & normal bowel sounds Central nervous system: alert & oriented. Moves all extremities   Psychiatry: Judgement and insight appears improved. Flat mood and affect     Data Reviewed: I have personally reviewed following labs and imaging studies  CBC: Recent Labs  Lab 12/16/23 2214 12/18/23 1040 12/19/23 0350 12/20/23 0338 12/21/23 0411  WBC 7.2 8.5 5.6 5.2 5.8  NEUTROABS  --  6.1  --   --   --   HGB 14.6 14.3 12.7* 12.4* 13.1  HCT 44.4 42.8 39.3 38.5* 40.9  MCV 90.8 88.6 89.5 88.9 89.3  PLT 265 268 235 222 255   Basic Metabolic Panel: Recent Labs  Lab 12/16/23 2214 12/18/23 1040 12/19/23 0350 12/20/23 0338 12/21/23 0411  NA 137 139 135 138 139  K 4.9  3.8 4.3 4.2 3.7  CL 98 99 98 100 99  CO2 22 25 24 25 26   GLUCOSE 189* 230* 303* 197* 176*  BUN 22* 20 31* 40* 35*  CREATININE 8.04* 6.03* 8.03* 10.51* 8.43*  CALCIUM 10.5* 9.4 9.5 9.1 9.3  MG 2.4  --   --   --   --    GFR: Estimated Creatinine Clearance: 12.8 mL/min (A) (by C-G formula based on SCr of 8.43 mg/dL (H)). Liver Function Tests: Recent Labs  Lab 12/16/23 2214 12/18/23 1040 12/20/23 0338 12/21/23 0411  AST 182* 41 35 24  ALT 129* 85* 70* 53*  ALKPHOS 441* 307* 286* 258*  BILITOT 0.9 1.3* 1.4* 1.5*  PROT 7.8 7.3 6.5 6.9  ALBUMIN 3.8 3.7 3.0* 3.1*   No results for input(s): LIPASE, AMYLASE in  the last 168 hours. Recent Labs  Lab 12/18/23 1120 12/20/23 0338 12/21/23 0411  AMMONIA 143* 152* 46*   Coagulation Profile: No results for input(s): INR, PROTIME in the last 168 hours. Cardiac Enzymes: No results for input(s): CKTOTAL, CKMB, CKMBINDEX, TROPONINI in the last 168 hours. BNP (last 3 results) No results for input(s): PROBNP in the last 8760 hours. HbA1C: No results for input(s): HGBA1C in the last 72 hours. CBG: Recent Labs  Lab 12/19/23 2051 12/20/23 1159 12/20/23 1713 12/20/23 2024 12/21/23 0805  GLUCAP 220* 198* 217* 268* 166*   Lipid Profile: No results for input(s): CHOL, HDL, LDLCALC, TRIG, CHOLHDL, LDLDIRECT in the last 72 hours. Thyroid Function Tests: Recent Labs    12/18/23 2128  TSH 2.732   Anemia Panel: Recent Labs    12/18/23 2128  VITAMINB12 818   Sepsis Labs: Recent Labs  Lab 12/17/23 0025  LATICACIDVEN 2.4*    Recent Results (from the past 240 hours)  Resp panel by RT-PCR (RSV, Flu A&B, Covid) Anterior Nasal Swab     Status: None   Collection Time: 12/18/23 11:56 AM   Specimen: Anterior Nasal Swab  Result Value Ref Range Status   SARS Coronavirus 2 by RT PCR NEGATIVE NEGATIVE Final    Comment: (NOTE) SARS-CoV-2 target nucleic acids are NOT DETECTED.  The SARS-CoV-2 RNA is generally detectable in upper respiratory specimens during the acute phase of infection. The lowest concentration of SARS-CoV-2 viral copies this assay can detect is 138 copies/mL. A negative result does not preclude SARS-Cov-2 infection and should not be used as the sole basis for treatment or other patient management decisions. A negative result may occur with  improper specimen collection/handling, submission of specimen other than nasopharyngeal swab, presence of viral mutation(s) within the areas targeted by this assay, and inadequate number of viral copies(<138 copies/mL). A negative result must be combined with clinical  observations, patient history, and epidemiological information. The expected result is Negative.  Fact Sheet for Patients:  BloggerCourse.com  Fact Sheet for Healthcare Providers:  SeriousBroker.it  This test is no t yet approved or cleared by the [place]  FDA and  has been authorized for detection and/or diagnosis of SARS-CoV-2 by FDA under an Emergency Use Authorization (EUA). This EUA will remain  in effect (meaning this test can be used) for the duration of the COVID-19 declaration under Section 564(b)(1) of the Act, 21 U.S.C.section 360bbb-3(b)(1), unless the authorization is terminated  or revoked sooner.       Influenza A by PCR NEGATIVE NEGATIVE Final   Influenza B by PCR NEGATIVE NEGATIVE Final    Comment: (NOTE) The Xpert Xpress SARS-CoV-2/FLU/RSV plus assay is intended as an  aid in the diagnosis of influenza from Nasopharyngeal swab specimens and should not be used as a sole basis for treatment. Nasal washings and aspirates are unacceptable for Xpert Xpress SARS-CoV-2/FLU/RSV testing.  Fact Sheet for Patients: BloggerCourse.com  Fact Sheet for Healthcare Providers: SeriousBroker.it  This test is not yet approved or cleared by the [place]  FDA and has been authorized for detection and/or diagnosis of SARS-CoV-2 by FDA under an Emergency Use Authorization (EUA). This EUA will remain in effect (meaning this test can be used) for the duration of the COVID-19 declaration under Section 564(b)(1) of the Act, 21 U.S.C. section 360bbb-3(b)(1), unless the authorization is terminated or revoked.     Resp Syncytial Virus by PCR NEGATIVE NEGATIVE Final    Comment: (NOTE) Fact Sheet for Patients: BloggerCourse.com  Fact Sheet for Healthcare Providers: SeriousBroker.it  This test is not yet approved or cleared by  the [place]  FDA and has been authorized for detection and/or diagnosis of SARS-CoV-2 by FDA under an Emergency Use Authorization (EUA). This EUA will remain in effect (meaning this test can be used) for the duration of the COVID-19 declaration under Section 564(b)(1) of the Act, 21 U.S.C. section 360bbb-3(b)(1), unless the authorization is terminated or revoked.  Performed at [facility], [address] [place], [place] [address]          Radiology Studies: EEG adult Result Date: 12/19/2023 Shelton Arlin KIDD, MD     12/19/2023  3:38 PM Patient Name: Nana Hoselton MRN: [id number] Epilepsy Attending: Arlin KIDD Shelton Referring Physician/Provider: Merrianne Locus, MD Date: 12/19/2023 Duration: 39.11 mins Patient history: 57yo m with ams. EEG to evaluate for seizure. Level of alertness: Awake AEDs during EEG study: None Technical aspects: This EEG study was done with scalp electrodes positioned according to the 10-20 International system of electrode placement. Electrical activity was reviewed with band pass filter of 1-70Hz , sensitivity of 7 uV/mm, display speed of 60mm/sec with a 60Hz  notched filter applied as appropriate. EEG data were recorded continuously and digitally stored.  Video monitoring was available and reviewed as appropriate. Description: EEG showed continuous generalized 3 to 6 Hz theta-delta slowing. Intermittent 13-15hz  beta activity was noted in fronto-central region. Hyperventilation and photic stimulation were not performed.   ABNORMALITY - Continuous slow, generalized IMPRESSION: This study is suggestive of moderate diffuse encephalopathy. No seizures or epileptiform discharges were seen throughout the recording. Priyanka O Yadav        Scheduled Meds:  apixaban   5 mg Oral BID   carvedilol   12.5 mg Oral BID WC   Chlorhexidine  Gluconate Cloth  6 each Topical Q0600   cloNIDine   0.1 mg Oral QHS   hydrALAZINE   25 mg Oral QID   insulin  aspart  0-9 Units  Subcutaneous TID WC   insulin  glargine-yfgn  12 Units Subcutaneous QHS   lactulose   30 g Oral TID   mycophenolate   180 mg Oral BID   tacrolimus   1 mg Oral BID   Continuous Infusions:   LOS: 2 days      Holston CHRISTELLA Pouch, MD [facility] Pager [phone]  If 7PM-7AM, please contact night-coverage [email or web address] 12/21/2023, 9:58 AM  '

## 2023-12-21 NOTE — Progress Notes (Signed)
   12/21/23 1802  Vitals  BP (!) 82/60  MAP (mmHg) 67  BP Method Automatic  Pulse Rate 60  Pulse Rate Source Monitor  MEWS COLOR  MEWS Score Color Green  Oxygen Therapy  SpO2 98 %  O2 Device Room Air  MEWS Score  MEWS Temp 0  MEWS Systolic 1  MEWS Pulse 0  MEWS RR 0  MEWS LOC 0  MEWS Score 1   Patient endorses feeling lightheaded VS above taken, notified Dr. Trudy, bolus of 500 ns ordered and administered.

## 2023-12-21 NOTE — TOC CM/SW Note (Signed)
 Transition of Care [facility]) - Inpatient Brief Assessment   Patient Details  Name: Wayne Parker MRN: [id number] Date of Birth: [birth year]  [facility] CM/SW Contact:    Lauraine JAYSON Carpen, LCSW Phone Number: 12/21/2023, 9:24 AM   Clinical Narrative: CSW reviewed chart. No TOC needs so far. RN placed consult for medication assistance. Pharmacist is aware. CSW will continue to follow progress. Please place another [facility] consult if any needs arise.  Transition of Care Asessment: Insurance and Status: Insurance coverage has been reviewed Patient has primary care physician: Yes Home environment has been reviewed: Single family home Prior level of function:: Not documented Prior/Current Home Services: No current home services Social Drivers of Health Review: SDOH reviewed no interventions necessary Readmission risk has been reviewed: Yes Transition of care needs: no transition of care needs at this time

## 2023-12-21 NOTE — Inpatient Diabetes Management (Signed)
 Inpatient Diabetes Program Recommendations  AACE/ADA: New Consensus Statement on Inpatient Glycemic Control (2015)  Target Ranges:  Prepandial:   less than 140 mg/dL      Peak postprandial:   less than 180 mg/dL (1-2 hours)      Critically ill patients:  140 - 180 mg/dL    Latest Reference Range & Units 12/20/23 11:59 12/20/23 17:13 12/20/23 20:24  Glucose-Capillary 70 - 99 mg/dL 801 (H)  2 units Novolog   217 (H)  3 units Novolog   268 (H)    12 units Semglee  @2202   (H): Data is abnormally high  Latest Reference Range & Units 12/21/23 08:05 12/21/23 11:36  Glucose-Capillary 70 - 99 mg/dL 833 (H)  2 units Novolog   306 (H)  7 units Novolog    (H): Data is abnormally high  Home DM Meds: Levemir  12 units at HS      Novolog  10-30 units TID      Metformin 1000 mg BID      Mounjaro 7.5 mg Qweek     Current Orders: Semglee  12 units at HS                           Novolog  Sensitive Correction Scale/ SSI (0-9 units) TID AC       MD- Please consider:   Start Novolog  Meal Coverage: Novolog  6 units TID with meals HOLD if pt NPO HOLD if pt eats <50% meals   --Will follow patient during hospitalization--  Adina Rudolpho Arrow RN, MSN, CDCES Diabetes Coordinator Inpatient Glycemic Control Team Team Pager: [phone] (8a-5p)

## 2023-12-21 NOTE — Progress Notes (Signed)
 [facility]  ROUNDING NOTE   Subjective:   Patient seen sitting at bedside Fully alert and oriented today Able to answer all questions appropriately Confirms his outpatient dialysis treatments are completed at [facility] on a TTS schedule, supervised by [facility]. Denies recent missed treatments.  Room air No lower extremity edema Was able to ambulate with supervision to nursing station to retrieve charging phone.   Objective:  Vital signs in last 24 hours:  Temp:  [97.7 F (36.5 C)-98.5 F (36.9 C)] 98.5 F (36.9 C) (06/27 0804) Pulse Rate:  [64-95] 77 (06/27 0804) Resp:  [15-18] 18 (06/27 0804) BP: (81-129)/(53-65) 115/65 (06/27 0804) SpO2:  [97 %-100 %] 97 % (06/27 0804)  Weight change:  Filed Weights   12/18/23 1051 12/20/23 0742  Weight: 131.1 kg 122.6 kg    Intake/Output: I/O last 3 completed shifts: In: 0  Out: 700 [Other:700]   Intake/Output this shift:  Total I/O In: 360 [P.O.:360] Out: -   Physical Exam: General: NAD  Head: Normocephalic, atraumatic. Moist oral mucosal membranes  Eyes: Anicteric  Neck: Supple  Lungs:  Clear to auscultation  Heart: Regular rate and rhythm  Abdomen:  Soft, nontender  Extremities:  No peripheral edema.  Neurologic: Awake, alert, conversant  Skin: Warm,dry, no rash  Access: Rt AVF    Basic Metabolic Panel: Recent Labs  Lab 12/16/23 2214 12/18/23 1040 12/19/23 0350 12/20/23 0338 12/21/23 0411  NA 137 139 135 138 139  K 4.9 3.8 4.3 4.2 3.7  CL 98 99 98 100 99  CO2 22 25 24 25 26   GLUCOSE 189* 230* 303* 197* 176*  BUN 22* 20 31* 40* 35*  CREATININE 8.04* 6.03* 8.03* 10.51* 8.43*  CALCIUM 10.5* 9.4 9.5 9.1 9.3  MG 2.4  --   --   --   --     Liver Function Tests: Recent Labs  Lab 12/16/23 2214 12/18/23 1040 12/20/23 0338 12/21/23 0411  AST 182* 41 35 24  ALT 129* 85* 70* 53*  ALKPHOS 441* 307* 286* 258*  BILITOT 0.9 1.3* 1.4* 1.5*  PROT 7.8 7.3 6.5 6.9  ALBUMIN 3.8 3.7 3.0* 3.1*   No  results for input(s): LIPASE, AMYLASE in the last 168 hours. Recent Labs  Lab 12/18/23 1120 12/20/23 0338 12/21/23 0411  AMMONIA 143* 152* 46*    CBC: Recent Labs  Lab 12/16/23 2214 12/18/23 1040 12/19/23 0350 12/20/23 0338 12/21/23 0411  WBC 7.2 8.5 5.6 5.2 5.8  NEUTROABS  --  6.1  --   --   --   HGB 14.6 14.3 12.7* 12.4* 13.1  HCT 44.4 42.8 39.3 38.5* 40.9  MCV 90.8 88.6 89.5 88.9 89.3  PLT 265 268 235 222 255    Cardiac Enzymes: No results for input(s): CKTOTAL, CKMB, CKMBINDEX, TROPONINI in the last 168 hours.  BNP: Invalid input(s): POCBNP  CBG: Recent Labs  Lab 12/19/23 2051 12/20/23 1159 12/20/23 1713 12/20/23 2024 12/21/23 0805  GLUCAP 220* 198* 217* 268* 166*    Microbiology: Results for orders placed or performed during the hospital encounter of 12/18/23  Resp panel by RT-PCR (RSV, Flu A&B, Covid) Anterior Nasal Swab     Status: None   Collection Time: 12/18/23 11:56 AM   Specimen: Anterior Nasal Swab  Result Value Ref Range Status   SARS Coronavirus 2 by RT PCR NEGATIVE NEGATIVE Final    Comment: (NOTE) SARS-CoV-2 target nucleic acids are NOT DETECTED.  The SARS-CoV-2 RNA is generally detectable in upper respiratory specimens during  the acute phase of infection. The lowest concentration of SARS-CoV-2 viral copies this assay can detect is 138 copies/mL. A negative result does not preclude SARS-Cov-2 infection and should not be used as the sole basis for treatment or other patient management decisions. A negative result may occur with  improper specimen collection/handling, submission of specimen other than nasopharyngeal swab, presence of viral mutation(s) within the areas targeted by this assay, and inadequate number of viral copies(<138 copies/mL). A negative result must be combined with clinical observations, patient history, and epidemiological information. The expected result is Negative.  Fact Sheet for Patients:   BloggerCourse.com  Fact Sheet for Healthcare Providers:  SeriousBroker.it  This test is no t yet approved or cleared by the [place]  FDA and  has been authorized for detection and/or diagnosis of SARS-CoV-2 by FDA under an Emergency Use Authorization (EUA). This EUA will remain  in effect (meaning this test can be used) for the duration of the COVID-19 declaration under Section 564(b)(1) of the Act, 21 U.S.C.section 360bbb-3(b)(1), unless the authorization is terminated  or revoked sooner.       Influenza A by PCR NEGATIVE NEGATIVE Final   Influenza B by PCR NEGATIVE NEGATIVE Final    Comment: (NOTE) The Xpert Xpress SARS-CoV-2/FLU/RSV plus assay is intended as an aid in the diagnosis of influenza from Nasopharyngeal swab specimens and should not be used as a sole basis for treatment. Nasal washings and aspirates are unacceptable for Xpert Xpress SARS-CoV-2/FLU/RSV testing.  Fact Sheet for Patients: BloggerCourse.com  Fact Sheet for Healthcare Providers: SeriousBroker.it  This test is not yet approved or cleared by the [place]  FDA and has been authorized for detection and/or diagnosis of SARS-CoV-2 by FDA under an Emergency Use Authorization (EUA). This EUA will remain in effect (meaning this test can be used) for the duration of the COVID-19 declaration under Section 564(b)(1) of the Act, 21 U.S.C. section 360bbb-3(b)(1), unless the authorization is terminated or revoked.     Resp Syncytial Virus by PCR NEGATIVE NEGATIVE Final    Comment: (NOTE) Fact Sheet for Patients: BloggerCourse.com  Fact Sheet for Healthcare Providers: SeriousBroker.it  This test is not yet approved or cleared by the [place]  FDA and has been authorized for detection and/or diagnosis of SARS-CoV-2 by FDA under an Emergency Use  Authorization (EUA). This EUA will remain in effect (meaning this test can be used) for the duration of the COVID-19 declaration under Section 564(b)(1) of the Act, 21 U.S.C. section 360bbb-3(b)(1), unless the authorization is terminated or revoked.  Performed at [facility], [address] [place], [place] [address]     Coagulation Studies: No results for input(s): LABPROT, INR in the last 72 hours.  Urinalysis: No results for input(s): COLORURINE, LABSPEC, PHURINE, GLUCOSEU, HGBUR, BILIRUBINUR, KETONESUR, PROTEINUR, UROBILINOGEN, NITRITE, LEUKOCYTESUR in the last 72 hours.  Invalid input(s): APPERANCEUR    Imaging: EEG adult Result Date: 12/19/2023 Shelton Arlin KIDD, MD     12/19/2023  3:38 PM Patient Name: Wayne Parker MRN: [id number] Epilepsy Attending: Arlin KIDD Shelton Referring Physician/Provider: Merrianne Locus, MD Date: 12/19/2023 Duration: 39.11 mins Patient history: 57yo m with ams. EEG to evaluate for seizure. Level of alertness: Awake AEDs during EEG study: None Technical aspects: This EEG study was done with scalp electrodes positioned according to the 10-20 International system of electrode placement. Electrical activity was reviewed with band pass filter of 1-70Hz , sensitivity of 7 uV/mm, display speed of 70mm/sec with a 60Hz  notched filter applied as appropriate. EEG data were  recorded continuously and digitally stored.  Video monitoring was available and reviewed as appropriate. Description: EEG showed continuous generalized 3 to 6 Hz theta-delta slowing. Intermittent 13-15hz  beta activity was noted in fronto-central region. Hyperventilation and photic stimulation were not performed.   ABNORMALITY - Continuous slow, generalized IMPRESSION: This study is suggestive of moderate diffuse encephalopathy. No seizures or epileptiform discharges were seen throughout the recording. Priyanka O Yadav     Medications:     apixaban   5 mg Oral BID    carvedilol   12.5 mg Oral BID WC   Chlorhexidine  Gluconate Cloth  6 each Topical Q0600   cloNIDine   0.1 mg Oral QHS   gabapentin  300 mg Oral QHS   hydrALAZINE   25 mg Oral QID   insulin  aspart  0-9 Units Subcutaneous TID WC   insulin  glargine-yfgn  12 Units Subcutaneous QHS   lactulose   30 g Oral TID   mycophenolate   180 mg Oral BID   tacrolimus   1 mg Oral BID   acetaminophen  **OR** acetaminophen , hydrALAZINE , ondansetron  **OR** ondansetron  (ZOFRAN ) IV  Assessment/ Plan:  Mr. Wayne Parker is a 58 y.o.  male with past medical conditions including hypertension, obesity, obstructive sleep apnea, cirrhosis, and end-stage renal disease on hemodialysis, who was admitted to [facility] on 12/18/2023 for Hepatic encephalopathy (HCC) [K76.82]  [facility]/Rt AVF/TTS  End stage renal disease on hemodialysis. Patient received majority of treatment yesterday, UF achieved. Next treatment scheduled for Saturday.   Hepatic encephalopathy, ammonia 143 on admission.  Reported liver transplant 2013 with little follow-up.  EEG shows moderate diffuse encephalopathy. Mentation has greatly improved, nursing staff report adequate bowel movements with scheduled lactulose .   3. Anemia of chronic kidney disease Lab Results  Component Value Date   HGB 13.1 12/21/2023    Hgb within optimal range. No need for ESA at this time.   4. Secondary Hyperparathyroidism: with outpatient labs:No records available  Lab Results  Component Value Date   CALCIUM 9.3 12/21/2023    Bone minerals within optimal range.     LOS: 2 Wayne Parker 6/27/202510:56 AM

## 2023-12-21 NOTE — Plan of Care (Signed)
   Problem: Safety: Goal: Ability to remain free from injury will improve Outcome: Progressing

## 2023-12-22 DIAGNOSIS — K7682 Hepatic encephalopathy: Secondary | ICD-10-CM | POA: Diagnosis not present

## 2023-12-22 LAB — COMPREHENSIVE METABOLIC PANEL WITH GFR
ALT: 41 U/L (ref 0–44)
AST: 21 U/L (ref 15–41)
Albumin: 3.4 g/dL — ABNORMAL LOW (ref 3.5–5.0)
Alkaline Phosphatase: 235 U/L — ABNORMAL HIGH (ref 38–126)
Anion gap: 14 (ref 5–15)
BUN: 43 mg/dL — ABNORMAL HIGH (ref 6–20)
CO2: 23 mmol/L (ref 22–32)
Calcium: 9.2 mg/dL (ref 8.9–10.3)
Chloride: 97 mmol/L — ABNORMAL LOW (ref 98–111)
Creatinine, Ser: 10.23 mg/dL — ABNORMAL HIGH (ref 0.61–1.24)
GFR, Estimated: 5 mL/min — ABNORMAL LOW (ref >60)
Glucose, Bld: 220 mg/dL — ABNORMAL HIGH (ref 70–99)
Potassium: 3.6 mmol/L (ref 3.5–5.1)
Sodium: 134 mmol/L — ABNORMAL LOW (ref 135–145)
Total Bilirubin: 1.2 mg/dL (ref 0.0–1.2)
Total Protein: 7 g/dL (ref 6.5–8.1)

## 2023-12-22 LAB — CBC
HCT: 41.4 % (ref 39.0–52.0)
Hemoglobin: 13.3 g/dL (ref 13.0–17.0)
MCH: 29 pg (ref 26.0–34.0)
MCHC: 32.1 g/dL (ref 30.0–36.0)
MCV: 90.2 fL (ref 80.0–100.0)
Platelets: 273 10*3/uL (ref 150–400)
RBC: 4.59 MIL/uL (ref 4.22–5.81)
RDW: 14.6 % (ref 11.5–15.5)
WBC: 6.6 10*3/uL (ref 4.0–10.5)
nRBC: 0 % (ref 0.0–0.2)

## 2023-12-22 LAB — GLUCOSE, CAPILLARY
Glucose-Capillary: 210 mg/dL — ABNORMAL HIGH (ref 70–99)
Glucose-Capillary: 207 mg/dL — ABNORMAL HIGH (ref 70–99)
Glucose-Capillary: 311 mg/dL — ABNORMAL HIGH (ref 70–99)
Glucose-Capillary: 209 mg/dL — ABNORMAL HIGH (ref 70–99)

## 2023-12-22 LAB — AMMONIA: Ammonia: 39 umol/L — ABNORMAL HIGH (ref 9–35)

## 2023-12-22 LAB — PHOSPHORUS: Phosphorus: 4.5 mg/dL (ref 2.5–4.6)

## 2023-12-22 MED ORDER — OXYCODONE HCL 5 MG PO TABS
10.0000 mg | ORAL_TABLET | Freq: Four times a day (QID) | ORAL | Status: DC | PRN
  Administered 2023-12-22 – 2023-12-23 (×2): 10 mg via ORAL
  Filled 2023-12-22 (×2): qty 2

## 2023-12-22 MED ORDER — MORPHINE SULFATE (PF) 2 MG/ML IV SOLN
1.0000 mg | INTRAVENOUS | Status: DC | PRN
  Administered 2023-12-22 (×2): 1 mg via INTRAVENOUS
  Filled 2023-12-22 (×2): qty 1

## 2023-12-22 NOTE — Progress Notes (Addendum)
 [facility]  ROUNDING NOTE   Subjective:   Patient seen sitting up in chair On room air Fully alert and oriented Tolerating meals without nausea or vomiting No lower extremity edema  Scheduled for dialysis this morning   Objective:  Vital signs in last 24 hours:  Temp:  [97.7 F (36.5 C)-98.6 F (37 C)] 97.7 F (36.5 C) (06/28 0953) Pulse Rate:  [55-79] 55 (06/28 1000) Resp:  [15-21] 21 (06/28 1000) BP: (82-135)/(60-109) 118/109 (06/28 1000) SpO2:  [96 %-98 %] 96 % (06/28 1000) Weight:  [122 kg] 122 kg (06/28 0953)  Weight change:  Filed Weights   12/18/23 1051 12/20/23 0742 12/22/23 0953  Weight: 131.1 kg 122.6 kg 122 kg    Intake/Output: I/O last 3 completed shifts: In: 720 [P.O.:720] Out: -    Intake/Output this shift:  No intake/output data recorded.  Physical Exam: General: NAD  Head: Normocephalic, atraumatic. Moist oral mucosal membranes  Eyes: Anicteric  Lungs:  Clear to auscultation  Heart: Regular rate and rhythm  Abdomen:  Soft, nontender, obese  Extremities:  No peripheral edema.  Neurologic: Awake, alert, conversant  Skin: Warm,dry, no rash  Access: Rt AVF    Basic Metabolic Panel: Recent Labs  Lab 12/16/23 2214 12/18/23 1040 12/19/23 0350 12/20/23 0338 12/21/23 0411 12/22/23 0335  NA 137 139 135 138 139 134*  K 4.9 3.8 4.3 4.2 3.7 3.6  CL 98 99 98 100 99 97*  CO2 22 25 24 25 26 23   GLUCOSE 189* 230* 303* 197* 176* 220*  BUN 22* 20 31* 40* 35* 43*  CREATININE 8.04* 6.03* 8.03* 10.51* 8.43* 10.23*  CALCIUM 10.5* 9.4 9.5 9.1 9.3 9.2  MG 2.4  --   --   --   --   --     Liver Function Tests: Recent Labs  Lab 12/16/23 2214 12/18/23 1040 12/20/23 0338 12/21/23 0411 12/22/23 0335  AST 182* 41 35 24 21  ALT 129* 85* 70* 53* 41  ALKPHOS 441* 307* 286* 258* 235*  BILITOT 0.9 1.3* 1.4* 1.5* 1.2  PROT 7.8 7.3 6.5 6.9 7.0  ALBUMIN 3.8 3.7 3.0* 3.1* 3.4*   No results for input(s): LIPASE, AMYLASE in the last 168  hours. Recent Labs  Lab 12/20/23 0338 12/21/23 0411 12/22/23 0335  AMMONIA 152* 46* 39*    CBC: Recent Labs  Lab 12/18/23 1040 12/19/23 0350 12/20/23 0338 12/21/23 0411 12/22/23 0335  WBC 8.5 5.6 5.2 5.8 6.6  NEUTROABS 6.1  --   --   --   --   HGB 14.3 12.7* 12.4* 13.1 13.3  HCT 42.8 39.3 38.5* 40.9 41.4  MCV 88.6 89.5 88.9 89.3 90.2  PLT 268 235 222 255 273    Cardiac Enzymes: No results for input(s): CKTOTAL, CKMB, CKMBINDEX, TROPONINI in the last 168 hours.  BNP: Invalid input(s): POCBNP  CBG: Recent Labs  Lab 12/21/23 0805 12/21/23 1136 12/21/23 1600 12/21/23 2042 12/22/23 0759  GLUCAP 166* 306* 404* 362* 210*    Microbiology: Results for orders placed or performed during the hospital encounter of 12/18/23  Resp panel by RT-PCR (RSV, Flu A&B, Covid) Anterior Nasal Swab     Status: None   Collection Time: 12/18/23 11:56 AM   Specimen: Anterior Nasal Swab  Result Value Ref Range Status   SARS Coronavirus 2 by RT PCR NEGATIVE NEGATIVE Final    Comment: (NOTE) SARS-CoV-2 target nucleic acids are NOT DETECTED.  The SARS-CoV-2 RNA is generally detectable in upper respiratory specimens during the  acute phase of infection. The lowest concentration of SARS-CoV-2 viral copies this assay can detect is 138 copies/mL. A negative result does not preclude SARS-Cov-2 infection and should not be used as the sole basis for treatment or other patient management decisions. A negative result may occur with  improper specimen collection/handling, submission of specimen other than nasopharyngeal swab, presence of viral mutation(s) within the areas targeted by this assay, and inadequate number of viral copies(<138 copies/mL). A negative result must be combined with clinical observations, patient history, and epidemiological information. The expected result is Negative.  Fact Sheet for Patients:  BloggerCourse.com  Fact Sheet for  Healthcare Providers:  SeriousBroker.it  This test is no t yet approved or cleared by the [place]  FDA and  has been authorized for detection and/or diagnosis of SARS-CoV-2 by FDA under an Emergency Use Authorization (EUA). This EUA will remain  in effect (meaning this test can be used) for the duration of the COVID-19 declaration under Section 564(b)(1) of the Act, 21 U.S.C.section 360bbb-3(b)(1), unless the authorization is terminated  or revoked sooner.       Influenza A by PCR NEGATIVE NEGATIVE Final   Influenza B by PCR NEGATIVE NEGATIVE Final    Comment: (NOTE) The Xpert Xpress SARS-CoV-2/FLU/RSV plus assay is intended as an aid in the diagnosis of influenza from Nasopharyngeal swab specimens and should not be used as a sole basis for treatment. Nasal washings and aspirates are unacceptable for Xpert Xpress SARS-CoV-2/FLU/RSV testing.  Fact Sheet for Patients: BloggerCourse.com  Fact Sheet for Healthcare Providers: SeriousBroker.it  This test is not yet approved or cleared by the [place]  FDA and has been authorized for detection and/or diagnosis of SARS-CoV-2 by FDA under an Emergency Use Authorization (EUA). This EUA will remain in effect (meaning this test can be used) for the duration of the COVID-19 declaration under Section 564(b)(1) of the Act, 21 U.S.C. section 360bbb-3(b)(1), unless the authorization is terminated or revoked.     Resp Syncytial Virus by PCR NEGATIVE NEGATIVE Final    Comment: (NOTE) Fact Sheet for Patients: BloggerCourse.com  Fact Sheet for Healthcare Providers: SeriousBroker.it  This test is not yet approved or cleared by the [place]  FDA and has been authorized for detection and/or diagnosis of SARS-CoV-2 by FDA under an Emergency Use Authorization (EUA). This EUA will remain in effect (meaning this  test can be used) for the duration of the COVID-19 declaration under Section 564(b)(1) of the Act, 21 U.S.C. section 360bbb-3(b)(1), unless the authorization is terminated or revoked.  Performed at [facility], [address] [place], [place] [address]     Coagulation Studies: No results for input(s): LABPROT, INR in the last 72 hours.  Urinalysis: No results for input(s): COLORURINE, LABSPEC, PHURINE, GLUCOSEU, HGBUR, BILIRUBINUR, KETONESUR, PROTEINUR, UROBILINOGEN, NITRITE, LEUKOCYTESUR in the last 72 hours.  Invalid input(s): APPERANCEUR    Imaging: CT ABDOMEN PELVIS WO CONTRAST Result Date: 12/21/2023 CLINICAL DATA:  Liver failure. History of liver transplant. Concern for recurring cirrhosis. EXAM: CT ABDOMEN AND PELVIS WITHOUT CONTRAST TECHNIQUE: Multidetector CT imaging of the abdomen and pelvis was performed following the standard protocol without IV contrast. RADIATION DOSE REDUCTION: This exam was performed according to the departmental dose-optimization program which includes automated exposure control, adjustment of the mA and/or kV according to patient size and/or use of iterative reconstruction technique. COMPARISON:  CT without contrast 05/15/2020, CT with contrast 11/08/2020. Reports are unavailable. Both studies from [facility]. Last CT for which a report is  available is CT with contrast 09/16/2015. FINDINGS: Lower chest: Calcified granuloma left lower lobe. No lung base infiltrates. The heart is slightly enlarged. There is calcification in the right coronary artery. Hepatobiliary: A transplanted liver is again noted with capsular nodularity primarily in the left lobe consistent with cirrhosis. This was seen previously. The hepatic portal main vein is normal in caliber. Gallbladder is absent with no biliary dilatation. No liver mass is seen without contrast. Pancreas: Partially atrophic, unchanged. No mass is seen  without contrast. Spleen: Unremarkable without contrast. Adrenals/Urinary Tract: Chronic slight nodular thickening both adrenal glands, stable. Bosniak 1 cyst in the posterior right kidney measures 1 cm and 4 Hounsfield units. Bosniak 1 cyst in the superior pole of the right kidney measures 9 mm and -1.2 Hounsfield units. No follow-up imaging recommended. There is contrast in the renal collecting systems probably left over from the CTA head and neck from 3 days ago. Both kidneys small in length with interval generalized cortical volume loss. Right kidney is 8.2 cm in length, left kidney 8.4 cm. There is no hydronephrosis or ureteral stone. No contour deforming mass of the unenhanced kidneys. Contrast in the collecting systems could obscure intrarenal stones. Bladder is contracted and not well seen. Stomach/Bowel: The stomach is filled with fluid and food. No wall thickening. The unopacified small bowel is normal caliber without inflammation. The appendix is normal. There is scattered fluid in the colon without wall thickening or inflammation. Uncomplicated scattered sigmoid diverticula. Vascular/Lymphatic: There are multiple upper abdominal engorged venous varices, lesser sac and right upper abdomen with prominent IVC. This was seen previously and seems unchanged except for perisplenic varices which are less prominent today. There is aortoiliac patchy atherosclerosis without AAA. Circumaortic left renal vein. There is a heterogeneously calcified 2.4 cm splenic artery aneurysm, stable in size and increasingly calcified about the splenic hilum. Reproductive: Prostate is unremarkable. Other: Small inguinal fat hernias. No incarcerated hernia. No free ascites, free hemorrhage or free air, or focal inflammatory process. Musculoskeletal: There are degenerative changes in the spine, spurring of the SI joints. No acute or significant osseous findings. Acquired spinal stenosis L4-5. IMPRESSION: 1. No acute noncontrast CT  findings in the abdomen or pelvis. 2. Transplanted liver with capsular nodularity primarily in the left lobe consistent with cirrhosis. This was seen previously. 3. Upper abdominal venous varices with prominent IVC. This was seen previously and seems unchanged except for perisplenic varices which are less prominent today. 4. Aortic and coronary artery atherosclerosis. 5. 2.4 cm calcified splenic artery aneurysm, stable in size and increasingly calcified about the splenic hilum. 6. Small inguinal fat hernias. 7. Fluid in the colon which could be due to diarrhea or ileus. No bowel wall thickening or inflammation. 8. Acquired spinal stenosis L4-5. 9. Bosniak 1 cysts in the right kidney. No follow-up imaging recommended. Aortic Atherosclerosis (ICD10-I70.0). Electronically Signed   By: Francis Quam M.D.   On: 12/21/2023 22:21     Medications:     apixaban   5 mg Oral BID   carvedilol   12.5 mg Oral BID WC   Chlorhexidine  Gluconate Cloth  6 each Topical Q0600   cloNIDine   0.1 mg Oral QHS   gabapentin  300 mg Oral QHS   hydrALAZINE   25 mg Oral QID   insulin  aspart  0-9 Units Subcutaneous TID WC   insulin  glargine-yfgn  12 Units Subcutaneous QHS   lactulose   30 g Oral TID   mycophenolate   180 mg Oral BID   tacrolimus   1 mg  Oral BID   acetaminophen  **OR** acetaminophen , alum & mag hydroxide-simeth, hydrALAZINE , ondansetron  **OR** ondansetron  (ZOFRAN ) IV  Assessment/ Plan:  Wayne Parker is a 58 y.o.  male with past medical conditions including hypertension, obesity, obstructive sleep apnea, cirrhosis, and end-stage renal disease on hemodialysis, who was admitted to [facility] on 12/18/2023 for Hepatic encephalopathy (HCC) [K76.82]  [facility]/Rt AVF/TTS  End stage renal disease on hemodialysis.  Patient scheduled to receive dialysis later today, UF 1.5 to 2 L as tolerated.  Next treatment scheduled for Tuesday.  Hepatic encephalopathy, ammonia 143 on admission.  Reported liver transplant  2013 with little follow-up.  EEG shows moderate diffuse encephalopathy. Mentation improved with scheduled lactulose .  3. Anemia of chronic kidney disease Lab Results  Component Value Date   HGB 13.3 12/22/2023    Hgb within optimal range.   4. Secondary Hyperparathyroidism: with outpatient labs:No records available  Lab Results  Component Value Date   CALCIUM 9.2 12/22/2023    Calcium remains acceptable.  Awaiting add on phosphorus level.  Will continue to monitor these bone minerals.    LOS: 3 Wayne Parker 6/28/202510:13 AM

## 2023-12-22 NOTE — Progress Notes (Signed)
 PROGRESS NOTE    Wayne Parker  FMW:[id number] DOB: 1966/04/04 DOA: 12/18/2023 PCP: Jama Selinda BRAVO, MD   Assessment & Plan:   Principal Problem:   Hepatic encephalopathy [facility]) Active Problems:   Hypertension   Type II diabetes mellitus with renal manifestations (HCC)   History of liver transplant (HCC)   ESRD on dialysis (HCC)   DVT (deep venous thrombosis) (HCC)   Paroxysmal atrial fibrillation (HCC)  Assessment and Plan: Hepatic encephalopathy: w/ improved mental status. W/ prior liver transplant & w/ recurrent cirrhosis as per CT abd/pelvis. Ammonia level is trending down. Was not taking lactulose  at home and has not seen GI specialist since transplant in 2013 as per pt.  Continue on lactulose . MRI brain & CT head shows no acute intracranial abnormalities. EEG showed no seizure or epileptiform discharges. Neuro following and recs apprec  Hx liver transplant: continue on home dose of tacrolimus , mycophenolate . CT abd/pelvis shows recurrent cirrhosis. Will f/u transplant surg on 12/24/23  ESRD: on HD TTS. Nephro following and recs apprec    HTN: continue on coreg , clonidine . Hold for MAP < 65 and/or HR < 65. Holding hydralazine     Hx of CAD: continue on coreg . Holding home imdur , hydralazine , aspirin   DM2: poorly controlled, HbA1c 11.8. Continue on glargine, SSI w/ accuchecks    DVT:  continue on eliquis     Likely PAF: continue on eliquis , coreg . Hold coreg  for MAP 65 and/or HR < 65.    Morbid obesity: BMI 42.6. Complicates overall care & prognosis        DVT prophylaxis: eliquis  Code Status: full  Family Communication: discussed pt's care w/ pt's sister, Olam, and answered her questions  Disposition Plan: d/c home tomorrow   Level of care: Med-Surg  Status is: Inpatient Remains inpatient appropriate because: will d/c home tomorrow      Consultants:  Neuro  Nephro   Procedures:   Antimicrobials:    Subjective: Pt c/o fatigue   Objective: Vitals:    12/21/23 1906 12/21/23 2009 12/22/23 0310 12/22/23 0817  BP: 103/73 (!) 135/97 115/64 113/72  Pulse: 68 61 70 68  Resp:  16 16 18   Temp:  98.4 F (36.9 C) 98.4 F (36.9 C) 98.6 F (37 C)  TempSrc:  Oral Oral   SpO2:  98% 96% 96%  Weight:      Height:        Intake/Output Summary (Last 24 hours) at 12/22/2023 0832 Last data filed at 12/21/2023 1826 Gross per 24 hour  Intake 720 ml  Output --  Net 720 ml   Filed Weights   12/18/23 1051 12/20/23 0742  Weight: 131.1 kg 122.6 kg    Examination:  General exam: appears comfortable  Respiratory system: decreased breath sounds b/l   Cardiovascular system: S1/S2+. No rubs or clicks  Gastrointestinal system: abd is soft, NT, obese & hypoactive bowel sounds  Central nervous system: alert & oriented. Moves all extremities   Psychiatry: Judgement and insight appears at baseline. Flat mood and affect    Data Reviewed: I have personally reviewed following labs and imaging studies  CBC: Recent Labs  Lab 12/18/23 1040 12/19/23 0350 12/20/23 0338 12/21/23 0411 12/22/23 0335  WBC 8.5 5.6 5.2 5.8 6.6  NEUTROABS 6.1  --   --   --   --   HGB 14.3 12.7* 12.4* 13.1 13.3  HCT 42.8 39.3 38.5* 40.9 41.4  MCV 88.6 89.5 88.9 89.3 90.2  PLT 268 235 222 255 273   Basic Metabolic Panel:  Recent Labs  Lab 12/16/23 2214 12/18/23 1040 12/19/23 0350 12/20/23 0338 12/21/23 0411 12/22/23 0335  NA 137 139 135 138 139 134*  K 4.9 3.8 4.3 4.2 3.7 3.6  CL 98 99 98 100 99 97*  CO2 22 25 24 25 26 23   GLUCOSE 189* 230* 303* 197* 176* 220*  BUN 22* 20 31* 40* 35* 43*  CREATININE 8.04* 6.03* 8.03* 10.51* 8.43* 10.23*  CALCIUM 10.5* 9.4 9.5 9.1 9.3 9.2  MG 2.4  --   --   --   --   --    GFR: Estimated Creatinine Clearance: 10.6 mL/min (A) (by C-G formula based on SCr of 10.23 mg/dL (H)). Liver Function Tests: Recent Labs  Lab 12/16/23 2214 12/18/23 1040 12/20/23 0338 12/21/23 0411 12/22/23 0335  AST 182* 41 35 24 21  ALT 129* 85* 70*  53* 41  ALKPHOS 441* 307* 286* 258* 235*  BILITOT 0.9 1.3* 1.4* 1.5* 1.2  PROT 7.8 7.3 6.5 6.9 7.0  ALBUMIN 3.8 3.7 3.0* 3.1* 3.4*   No results for input(s): LIPASE, AMYLASE in the last 168 hours. Recent Labs  Lab 12/18/23 1120 12/20/23 0338 12/21/23 0411 12/22/23 0335  AMMONIA 143* 152* 46* 39*   Coagulation Profile: No results for input(s): INR, PROTIME in the last 168 hours. Cardiac Enzymes: No results for input(s): CKTOTAL, CKMB, CKMBINDEX, TROPONINI in the last 168 hours. BNP (last 3 results) No results for input(s): PROBNP in the last 8760 hours. HbA1C: No results for input(s): HGBA1C in the last 72 hours. CBG: Recent Labs  Lab 12/21/23 0805 12/21/23 1136 12/21/23 1600 12/21/23 2042 12/22/23 0759  GLUCAP 166* 306* 404* 362* 210*   Lipid Profile: No results for input(s): CHOL, HDL, LDLCALC, TRIG, CHOLHDL, LDLDIRECT in the last 72 hours. Thyroid Function Tests: No results for input(s): TSH, T4TOTAL, FREET4, T3FREE, THYROIDAB in the last 72 hours.  Anemia Panel: No results for input(s): VITAMINB12, FOLATE, FERRITIN, TIBC, IRON, RETICCTPCT in the last 72 hours.  Sepsis Labs: Recent Labs  Lab 12/17/23 0025  LATICACIDVEN 2.4*    Recent Results (from the past 240 hours)  Resp panel by RT-PCR (RSV, Flu A&B, Covid) Anterior Nasal Swab     Status: None   Collection Time: 12/18/23 11:56 AM   Specimen: Anterior Nasal Swab  Result Value Ref Range Status   SARS Coronavirus 2 by RT PCR NEGATIVE NEGATIVE Final    Comment: (NOTE) SARS-CoV-2 target nucleic acids are NOT DETECTED.  The SARS-CoV-2 RNA is generally detectable in upper respiratory specimens during the acute phase of infection. The lowest concentration of SARS-CoV-2 viral copies this assay can detect is 138 copies/mL. A negative result does not preclude SARS-Cov-2 infection and should not be used as the sole basis for treatment or other patient  management decisions. A negative result may occur with  improper specimen collection/handling, submission of specimen other than nasopharyngeal swab, presence of viral mutation(s) within the areas targeted by this assay, and inadequate number of viral copies(<138 copies/mL). A negative result must be combined with clinical observations, patient history, and epidemiological information. The expected result is Negative.  Fact Sheet for Patients:  BloggerCourse.com  Fact Sheet for Healthcare Providers:  SeriousBroker.it  This test is no t yet approved or cleared by the [place]  FDA and  has been authorized for detection and/or diagnosis of SARS-CoV-2 by FDA under an Emergency Use Authorization (EUA). This EUA will remain  in effect (meaning this test can be used) for the duration of the COVID-19 declaration  under Section 564(b)(1) of the Act, 21 U.S.C.section 360bbb-3(b)(1), unless the authorization is terminated  or revoked sooner.       Influenza A by PCR NEGATIVE NEGATIVE Final   Influenza B by PCR NEGATIVE NEGATIVE Final    Comment: (NOTE) The Xpert Xpress SARS-CoV-2/FLU/RSV plus assay is intended as an aid in the diagnosis of influenza from Nasopharyngeal swab specimens and should not be used as a sole basis for treatment. Nasal washings and aspirates are unacceptable for Xpert Xpress SARS-CoV-2/FLU/RSV testing.  Fact Sheet for Patients: BloggerCourse.com  Fact Sheet for Healthcare Providers: SeriousBroker.it  This test is not yet approved or cleared by the [place]  FDA and has been authorized for detection and/or diagnosis of SARS-CoV-2 by FDA under an Emergency Use Authorization (EUA). This EUA will remain in effect (meaning this test can be used) for the duration of the COVID-19 declaration under Section 564(b)(1) of the Act, 21 U.S.C. section 360bbb-3(b)(1),  unless the authorization is terminated or revoked.     Resp Syncytial Virus by PCR NEGATIVE NEGATIVE Final    Comment: (NOTE) Fact Sheet for Patients: BloggerCourse.com  Fact Sheet for Healthcare Providers: SeriousBroker.it  This test is not yet approved or cleared by the [place]  FDA and has been authorized for detection and/or diagnosis of SARS-CoV-2 by FDA under an Emergency Use Authorization (EUA). This EUA will remain in effect (meaning this test can be used) for the duration of the COVID-19 declaration under Section 564(b)(1) of the Act, 21 U.S.C. section 360bbb-3(b)(1), unless the authorization is terminated or revoked.  Performed at [facility], [address], [place], [place] [address]          Radiology Studies: CT ABDOMEN PELVIS WO CONTRAST Result Date: 12/21/2023 CLINICAL DATA:  Liver failure. History of liver transplant. Concern for recurring cirrhosis. EXAM: CT ABDOMEN AND PELVIS WITHOUT CONTRAST TECHNIQUE: Multidetector CT imaging of the abdomen and pelvis was performed following the standard protocol without IV contrast. RADIATION DOSE REDUCTION: This exam was performed according to the departmental dose-optimization program which includes automated exposure control, adjustment of the mA and/or kV according to patient size and/or use of iterative reconstruction technique. COMPARISON:  CT without contrast 05/15/2020, CT with contrast 11/08/2020. Reports are unavailable. Both studies from [facility]. Last CT for which a report is available is CT with contrast 09/16/2015. FINDINGS: Lower chest: Calcified granuloma left lower lobe. No lung base infiltrates. The heart is slightly enlarged. There is calcification in the right coronary artery. Hepatobiliary: A transplanted liver is again noted with capsular nodularity primarily in the left lobe consistent with cirrhosis. This was seen  previously. The hepatic portal main vein is normal in caliber. Gallbladder is absent with no biliary dilatation. No liver mass is seen without contrast. Pancreas: Partially atrophic, unchanged. No mass is seen without contrast. Spleen: Unremarkable without contrast. Adrenals/Urinary Tract: Chronic slight nodular thickening both adrenal glands, stable. Bosniak 1 cyst in the posterior right kidney measures 1 cm and 4 Hounsfield units. Bosniak 1 cyst in the superior pole of the right kidney measures 9 mm and -1.2 Hounsfield units. No follow-up imaging recommended. There is contrast in the renal collecting systems probably left over from the CTA head and neck from 3 days ago. Both kidneys small in length with interval generalized cortical volume loss. Right kidney is 8.2 cm in length, left kidney 8.4 cm. There is no hydronephrosis or ureteral stone. No contour deforming mass of the unenhanced kidneys. Contrast in the collecting systems could  obscure intrarenal stones. Bladder is contracted and not well seen. Stomach/Bowel: The stomach is filled with fluid and food. No wall thickening. The unopacified small bowel is normal caliber without inflammation. The appendix is normal. There is scattered fluid in the colon without wall thickening or inflammation. Uncomplicated scattered sigmoid diverticula. Vascular/Lymphatic: There are multiple upper abdominal engorged venous varices, lesser sac and right upper abdomen with prominent IVC. This was seen previously and seems unchanged except for perisplenic varices which are less prominent today. There is aortoiliac patchy atherosclerosis without AAA. Circumaortic left renal vein. There is a heterogeneously calcified 2.4 cm splenic artery aneurysm, stable in size and increasingly calcified about the splenic hilum. Reproductive: Prostate is unremarkable. Other: Small inguinal fat hernias. No incarcerated hernia. No free ascites, free hemorrhage or free air, or focal inflammatory  process. Musculoskeletal: There are degenerative changes in the spine, spurring of the SI joints. No acute or significant osseous findings. Acquired spinal stenosis L4-5. IMPRESSION: 1. No acute noncontrast CT findings in the abdomen or pelvis. 2. Transplanted liver with capsular nodularity primarily in the left lobe consistent with cirrhosis. This was seen previously. 3. Upper abdominal venous varices with prominent IVC. This was seen previously and seems unchanged except for perisplenic varices which are less prominent today. 4. Aortic and coronary artery atherosclerosis. 5. 2.4 cm calcified splenic artery aneurysm, stable in size and increasingly calcified about the splenic hilum. 6. Small inguinal fat hernias. 7. Fluid in the colon which could be due to diarrhea or ileus. No bowel wall thickening or inflammation. 8. Acquired spinal stenosis L4-5. 9. Bosniak 1 cysts in the right kidney. No follow-up imaging recommended. Aortic Atherosclerosis (ICD10-I70.0). Electronically Signed   By: Francis Quam M.D.   On: 12/21/2023 22:21        Scheduled Meds:  apixaban   5 mg Oral BID   carvedilol   12.5 mg Oral BID WC   Chlorhexidine  Gluconate Cloth  6 each Topical Q0600   cloNIDine   0.1 mg Oral QHS   gabapentin  300 mg Oral QHS   hydrALAZINE   25 mg Oral QID   insulin  aspart  0-9 Units Subcutaneous TID WC   insulin  glargine-yfgn  12 Units Subcutaneous QHS   lactulose   30 g Oral TID   mycophenolate   180 mg Oral BID   tacrolimus   1 mg Oral BID   Continuous Infusions:   LOS: 3 days      Ousmane CHRISTELLA Pouch, MD [facility] Pager [phone]  If 7PM-7AM, please contact night-coverage [email or web address] 12/22/2023, 8:32 AM

## 2023-12-22 NOTE — Progress Notes (Signed)
 OT Screen Note  Patient Details Name: Zak Gondek MRN: [id number] DOB: 10-15-1965   Cancelled Treatment:    Reason Eval/Treat Not Completed: OT screened, no needs identified, will sign off. OT orders received. Chart reviewed. Per pt/care team, pt up ad lib in room. Pt reports feeling at/near baseline level of functional independence. No acute deficits appreciated. No skilled OT needs identified. Will sign off at this time. Please re-consult if additional OT needs arise during this hospital stay.   Jhonny Pelton, M.S., OTR/L 12/22/23, 9:46 AM

## 2023-12-22 NOTE — Progress Notes (Signed)
 PT Cancellation Note  Patient Details Name: Wayne Parker MRN: [id number] DOB: Apr 09, 1966   Cancelled Treatment:    Reason Eval/Treat Not Completed: PT screened, no needs identified, will sign offPT screen this date. Pt found sitting up EOB eating breakfast, A&O x4, states he does not need any physical therapy. Pt lives in single story home with wife, baseline ind with ADLs and mobility, no AD use. Pt chart has multiple documented level 6 (ambulating independently) notes with nursing, no concerns thus far. PT educated pt on reaching out if status changes. Consult PT if status changes. D/C in house.    Bayyinah Dukeman Romero-Perozo, SPT  12/22/2023, 8:24 AM

## 2023-12-22 NOTE — Progress Notes (Signed)
 Hemodialysis Note:  Received patient in chair to unit. Alert and oriented. Informed consent singed and in chart.  Treatment initiated: 1010 Treatment completed: 1349  Access used: Right AVF Access issues: None  Patient tolerated well. Transported back to room, alert without acute distress. Report given to patient's RN.  Total UF removed: 900 ml Medications given: None  Post HD weight: 121.5 kg  [facility]

## 2023-12-23 DIAGNOSIS — K7682 Hepatic encephalopathy: Secondary | ICD-10-CM | POA: Diagnosis not present

## 2023-12-23 DIAGNOSIS — Z79899 Other long term (current) drug therapy: Principal | ICD-10-CM

## 2023-12-23 DIAGNOSIS — T8649 Other complications of liver transplant: Principal | ICD-10-CM

## 2023-12-23 DIAGNOSIS — K74 Liver fibrosis, transplanted liver: Principal | ICD-10-CM

## 2023-12-23 DIAGNOSIS — Z944 Liver transplant status: Principal | ICD-10-CM

## 2023-12-23 LAB — CBC
HCT: 40 % (ref 39.0–52.0)
Hemoglobin: 13 g/dL (ref 13.0–17.0)
MCH: 29.1 pg (ref 26.0–34.0)
MCHC: 32.5 g/dL (ref 30.0–36.0)
MCV: 89.5 fL (ref 80.0–100.0)
Platelets: 239 10*3/uL (ref 150–400)
RBC: 4.47 MIL/uL (ref 4.22–5.81)
RDW: 14.4 % (ref 11.5–15.5)
WBC: 5.6 10*3/uL (ref 4.0–10.5)
nRBC: 0 % (ref 0.0–0.2)

## 2023-12-23 LAB — COMPREHENSIVE METABOLIC PANEL WITH GFR
ALT: 34 U/L (ref 0–44)
AST: 22 U/L (ref 15–41)
Albumin: 3.1 g/dL — ABNORMAL LOW (ref 3.5–5.0)
Alkaline Phosphatase: 232 U/L — ABNORMAL HIGH (ref 38–126)
Anion gap: 11 (ref 5–15)
BUN: 26 mg/dL — ABNORMAL HIGH (ref 6–20)
CO2: 25 mmol/L (ref 22–32)
Calcium: 8.9 mg/dL (ref 8.9–10.3)
Chloride: 96 mmol/L — ABNORMAL LOW (ref 98–111)
Creatinine, Ser: 7.42 mg/dL — ABNORMAL HIGH (ref 0.61–1.24)
GFR, Estimated: 8 mL/min — ABNORMAL LOW (ref >60)
Glucose, Bld: 209 mg/dL — ABNORMAL HIGH (ref 70–99)
Potassium: 3.5 mmol/L (ref 3.5–5.1)
Sodium: 132 mmol/L — ABNORMAL LOW (ref 135–145)
Total Bilirubin: 1.2 mg/dL (ref 0.0–1.2)
Total Protein: 6.7 g/dL (ref 6.5–8.1)

## 2023-12-23 LAB — GLUCOSE, CAPILLARY
Glucose-Capillary: 210 mg/dL — ABNORMAL HIGH (ref 70–99)
Glucose-Capillary: 241 mg/dL — ABNORMAL HIGH (ref 70–99)

## 2023-12-23 LAB — AMMONIA: Ammonia: 55 umol/L — ABNORMAL HIGH (ref 9–35)

## 2023-12-23 MED ORDER — LACTULOSE 10 GM/15ML PO SOLN: 30.0000 g | Freq: Two times a day (BID) | ORAL | 0 refills | Status: AC

## 2023-12-23 NOTE — Plan of Care (Signed)

## 2023-12-23 NOTE — Progress Notes (Signed)
 [facility]  ROUNDING NOTE   Subjective:   Patient sitting up in chair by window Awaiting breakfast Alert and oriented Expecting discharge later today.   Objective:  Vital signs in last 24 hours:  Temp:  [97.4 F (36.3 C)-98.9 F (37.2 C)] 98.2 F (36.8 C) (06/29 0740) Pulse Rate:  [54-77] 63 (06/29 0740) Resp:  [15-28] 20 (06/29 0740) BP: (91-142)/(60-109) 121/75 (06/29 0740) SpO2:  [92 %-100 %] 97 % (06/29 0740) Weight:  [121.5 kg-122 kg] 121.5 kg (06/28 1349)  Weight change:  Filed Weights   12/20/23 0742 12/22/23 0953 12/22/23 1349  Weight: 122.6 kg 122 kg 121.5 kg    Intake/Output: I/O last 3 completed shifts: In: -  Out: 900 [Other:900]   Intake/Output this shift:  No intake/output data recorded.  Physical Exam: General: NAD  Head: Normocephalic, atraumatic. Moist oral mucosal membranes  Eyes: Anicteric  Lungs:  Clear to auscultation, normal effort  Heart: Regular rate and rhythm  Abdomen:  Soft, nontender, obese  Extremities:  No peripheral edema.  Neurologic: Awake, alert, conversant  Skin: Warm,dry, no rash  Access: Rt AVF    Basic Metabolic Panel: Recent Labs  Lab 12/16/23 2214 12/18/23 1040 12/19/23 0350 12/20/23 0338 12/21/23 0411 12/22/23 0335 12/23/23 0441  NA 137   < > 135 138 139 134* 132*  K 4.9   < > 4.3 4.2 3.7 3.6 3.5  CL 98   < > 98 100 99 97* 96*  CO2 22   < > 24 25 26 23 25   GLUCOSE 189*   < > 303* 197* 176* 220* 209*  BUN 22*   < > 31* 40* 35* 43* 26*  CREATININE 8.04*   < > 8.03* 10.51* 8.43* 10.23* 7.42*  CALCIUM 10.5*   < > 9.5 9.1 9.3 9.2 8.9  MG 2.4  --   --   --   --   --   --   PHOS  --   --   --   --   --  4.5  --    < > = values in this interval not displayed.    Liver Function Tests: Recent Labs  Lab 12/18/23 1040 12/20/23 0338 12/21/23 0411 12/22/23 0335 12/23/23 0441  AST 41 35 24 21 22   ALT 85* 70* 53* 41 34  ALKPHOS 307* 286* 258* 235* 232*  BILITOT 1.3* 1.4* 1.5* 1.2 1.2  PROT 7.3  6.5 6.9 7.0 6.7  ALBUMIN 3.7 3.0* 3.1* 3.4* 3.1*   No results for input(s): LIPASE, AMYLASE in the last 168 hours. Recent Labs  Lab 12/21/23 0411 12/22/23 0335 12/23/23 0441  AMMONIA 46* 39* 55*    CBC: Recent Labs  Lab 12/18/23 1040 12/19/23 0350 12/20/23 0338 12/21/23 0411 12/22/23 0335 12/23/23 0441  WBC 8.5 5.6 5.2 5.8 6.6 5.6  NEUTROABS 6.1  --   --   --   --   --   HGB 14.3 12.7* 12.4* 13.1 13.3 13.0  HCT 42.8 39.3 38.5* 40.9 41.4 40.0  MCV 88.6 89.5 88.9 89.3 90.2 89.5  PLT 268 235 222 255 273 239    Cardiac Enzymes: No results for input(s): CKTOTAL, CKMB, CKMBINDEX, TROPONINI in the last 168 hours.  BNP: Invalid input(s): POCBNP  CBG: Recent Labs  Lab 12/22/23 0759 12/22/23 1512 12/22/23 1712 12/22/23 2048 12/23/23 0741  GLUCAP 210* 207* 311* 209* 210*    Microbiology: Results for orders placed or performed during the hospital encounter of 12/18/23  Resp panel by  RT-PCR (RSV, Flu A&B, Covid) Anterior Nasal Swab     Status: None   Collection Time: 12/18/23 11:56 AM   Specimen: Anterior Nasal Swab  Result Value Ref Range Status   SARS Coronavirus 2 by RT PCR NEGATIVE NEGATIVE Final    Comment: (NOTE) SARS-CoV-2 target nucleic acids are NOT DETECTED.  The SARS-CoV-2 RNA is generally detectable in upper respiratory specimens during the acute phase of infection. The lowest concentration of SARS-CoV-2 viral copies this assay can detect is 138 copies/mL. A negative result does not preclude SARS-Cov-2 infection and should not be used as the sole basis for treatment or other patient management decisions. A negative result may occur with  improper specimen collection/handling, submission of specimen other than nasopharyngeal swab, presence of viral mutation(s) within the areas targeted by this assay, and inadequate number of viral copies(<138 copies/mL). A negative result must be combined with clinical observations, patient history, and  epidemiological information. The expected result is Negative.  Fact Sheet for Patients:  BloggerCourse.com  Fact Sheet for Healthcare Providers:  SeriousBroker.it  This test is no t yet approved or cleared by the [place]  FDA and  has been authorized for detection and/or diagnosis of SARS-CoV-2 by FDA under an Emergency Use Authorization (EUA). This EUA will remain  in effect (meaning this test can be used) for the duration of the COVID-19 declaration under Section 564(b)(1) of the Act, 21 U.S.C.section 360bbb-3(b)(1), unless the authorization is terminated  or revoked sooner.       Influenza A by PCR NEGATIVE NEGATIVE Final   Influenza B by PCR NEGATIVE NEGATIVE Final    Comment: (NOTE) The Xpert Xpress SARS-CoV-2/FLU/RSV plus assay is intended as an aid in the diagnosis of influenza from Nasopharyngeal swab specimens and should not be used as a sole basis for treatment. Nasal washings and aspirates are unacceptable for Xpert Xpress SARS-CoV-2/FLU/RSV testing.  Fact Sheet for Patients: BloggerCourse.com  Fact Sheet for Healthcare Providers: SeriousBroker.it  This test is not yet approved or cleared by the [place]  FDA and has been authorized for detection and/or diagnosis of SARS-CoV-2 by FDA under an Emergency Use Authorization (EUA). This EUA will remain in effect (meaning this test can be used) for the duration of the COVID-19 declaration under Section 564(b)(1) of the Act, 21 U.S.C. section 360bbb-3(b)(1), unless the authorization is terminated or revoked.     Resp Syncytial Virus by PCR NEGATIVE NEGATIVE Final    Comment: (NOTE) Fact Sheet for Patients: BloggerCourse.com  Fact Sheet for Healthcare Providers: SeriousBroker.it  This test is not yet approved or cleared by the [place]  FDA and has been  authorized for detection and/or diagnosis of SARS-CoV-2 by FDA under an Emergency Use Authorization (EUA). This EUA will remain in effect (meaning this test can be used) for the duration of the COVID-19 declaration under Section 564(b)(1) of the Act, 21 U.S.C. section 360bbb-3(b)(1), unless the authorization is terminated or revoked.  Performed at [facility], [address] [place], [place] [address]     Coagulation Studies: No results for input(s): LABPROT, INR in the last 72 hours.  Urinalysis: No results for input(s): COLORURINE, LABSPEC, PHURINE, GLUCOSEU, HGBUR, BILIRUBINUR, KETONESUR, PROTEINUR, UROBILINOGEN, NITRITE, LEUKOCYTESUR in the last 72 hours.  Invalid input(s): APPERANCEUR    Imaging: CT ABDOMEN PELVIS WO CONTRAST Result Date: 12/21/2023 CLINICAL DATA:  Liver failure. History of liver transplant. Concern for recurring cirrhosis. EXAM: CT ABDOMEN AND PELVIS WITHOUT CONTRAST TECHNIQUE: Multidetector CT imaging of the abdomen and pelvis was performed  following the standard protocol without IV contrast. RADIATION DOSE REDUCTION: This exam was performed according to the departmental dose-optimization program which includes automated exposure control, adjustment of the mA and/or kV according to patient size and/or use of iterative reconstruction technique. COMPARISON:  CT without contrast 05/15/2020, CT with contrast 11/08/2020. Reports are unavailable. Both studies from [facility]. Last CT for which a report is available is CT with contrast 09/16/2015. FINDINGS: Lower chest: Calcified granuloma left lower lobe. No lung base infiltrates. The heart is slightly enlarged. There is calcification in the right coronary artery. Hepatobiliary: A transplanted liver is again noted with capsular nodularity primarily in the left lobe consistent with cirrhosis. This was seen previously. The hepatic portal main vein is normal in  caliber. Gallbladder is absent with no biliary dilatation. No liver mass is seen without contrast. Pancreas: Partially atrophic, unchanged. No mass is seen without contrast. Spleen: Unremarkable without contrast. Adrenals/Urinary Tract: Chronic slight nodular thickening both adrenal glands, stable. Bosniak 1 cyst in the posterior right kidney measures 1 cm and 4 Hounsfield units. Bosniak 1 cyst in the superior pole of the right kidney measures 9 mm and -1.2 Hounsfield units. No follow-up imaging recommended. There is contrast in the renal collecting systems probably left over from the CTA head and neck from 3 days ago. Both kidneys small in length with interval generalized cortical volume loss. Right kidney is 8.2 cm in length, left kidney 8.4 cm. There is no hydronephrosis or ureteral stone. No contour deforming mass of the unenhanced kidneys. Contrast in the collecting systems could obscure intrarenal stones. Bladder is contracted and not well seen. Stomach/Bowel: The stomach is filled with fluid and food. No wall thickening. The unopacified small bowel is normal caliber without inflammation. The appendix is normal. There is scattered fluid in the colon without wall thickening or inflammation. Uncomplicated scattered sigmoid diverticula. Vascular/Lymphatic: There are multiple upper abdominal engorged venous varices, lesser sac and right upper abdomen with prominent IVC. This was seen previously and seems unchanged except for perisplenic varices which are less prominent today. There is aortoiliac patchy atherosclerosis without AAA. Circumaortic left renal vein. There is a heterogeneously calcified 2.4 cm splenic artery aneurysm, stable in size and increasingly calcified about the splenic hilum. Reproductive: Prostate is unremarkable. Other: Small inguinal fat hernias. No incarcerated hernia. No free ascites, free hemorrhage or free air, or focal inflammatory process. Musculoskeletal: There are degenerative changes  in the spine, spurring of the SI joints. No acute or significant osseous findings. Acquired spinal stenosis L4-5. IMPRESSION: 1. No acute noncontrast CT findings in the abdomen or pelvis. 2. Transplanted liver with capsular nodularity primarily in the left lobe consistent with cirrhosis. This was seen previously. 3. Upper abdominal venous varices with prominent IVC. This was seen previously and seems unchanged except for perisplenic varices which are less prominent today. 4. Aortic and coronary artery atherosclerosis. 5. 2.4 cm calcified splenic artery aneurysm, stable in size and increasingly calcified about the splenic hilum. 6. Small inguinal fat hernias. 7. Fluid in the colon which could be due to diarrhea or ileus. No bowel wall thickening or inflammation. 8. Acquired spinal stenosis L4-5. 9. Bosniak 1 cysts in the right kidney. No follow-up imaging recommended. Aortic Atherosclerosis (ICD10-I70.0). Electronically Signed   By: Francis Quam M.D.   On: 12/21/2023 22:21     Medications:     apixaban   5 mg Oral BID   carvedilol   12.5 mg Oral BID WC   Chlorhexidine  Gluconate Cloth  6 each Topical Q0600   cloNIDine   0.1 mg Oral QHS   gabapentin  300 mg Oral QHS   insulin  aspart  0-9 Units Subcutaneous TID WC   insulin  glargine-yfgn  12 Units Subcutaneous QHS   lactulose   30 g Oral TID   mycophenolate   180 mg Oral BID   tacrolimus   1 mg Oral BID   acetaminophen  **OR** acetaminophen , alum & mag hydroxide-simeth, hydrALAZINE , morphine injection, ondansetron  **OR** ondansetron  (ZOFRAN ) IV, oxyCODONE   Assessment/ Plan:  Wayne Parker is a 58 y.o.  male with past medical conditions including hypertension, obesity, obstructive sleep apnea, cirrhosis, and end-stage renal disease on hemodialysis, who was admitted to [facility] on 12/18/2023 for Hepatic encephalopathy (HCC) [K76.82]  [facility]/Rt AVF/TTS  End stage renal disease on hemodialysis.  Patient received dialysis yesterday, UF 900 mL  achieved.  Next treatment scheduled for Tuesday.  Hepatic encephalopathy, ammonia 143 on admission.  Reported liver transplant 2013 with little follow-up.  EEG shows moderate diffuse encephalopathy. Mentation improved with scheduled lactulose .  Patient to follow-up with outpatient hepatologist.  3. Anemia of chronic kidney disease Lab Results  Component Value Date   HGB 13.0 12/23/2023    Hgb remains stable during this admission.   4. Secondary Hyperparathyroidism: with outpatient labs:No records available  Lab Results  Component Value Date   CALCIUM 8.9 12/23/2023   PHOS 4.5 12/22/2023    Calcium and phosphorus within optimal range.  Will continue to monitor these bone minerals.    LOS: 4 Ryle Buscemi 6/29/20259:39 AM

## 2023-12-23 NOTE — Plan of Care (Signed)

## 2023-12-23 NOTE — Discharge Summary (Addendum)
 . Physician Discharge Summary  Wayne Parker:[id number] DOB: 1966/01/12 DOA: 12/18/2023  PCP: Jama Selinda BRAVO, MD  Admit date: 12/18/2023 Discharge date: 12/23/2023  Admitted From: home  Disposition:  home   Recommendations for Outpatient Follow-up:  Follow up with PCP in 1-2 weeks F/u w/ transplant surgeon in 1-2 days  Home Health: no  Equipment/Devices:   Discharge Condition: stable  CODE STATUS: full  Diet recommendation: Heart Healthy / Carb Modified   Brief/Interim Summary: HPI was taken from Dr. Barbarann: [name] is a 58 y.o. male with medical history significant of cirrhosis, HTN, morbid obesity, OSA, and ESRD who presented on 6/24 with code stroke. He was sent from HD with concern for CVA.  Reportedly, he was 1.5 hours into his session when he developed acute R-sided weakness, LE weakness, and aphasia/dysarthria.  Code stroke called.  He is currently somnolent and confused with perseverative speech.       ER Course:  Came from HD with aphasia, RUE weakness.  Neurology has seen, code stroke.  Somnolent, confused.  CT negative.  NH4 143, likely hepatic encephalopathy.  Still needs MRI.  Given lactulose   Discharge Diagnoses:  Principal Problem:   Hepatic encephalopathy (HCC) Active Problems:   Hypertension   Type II diabetes mellitus with renal manifestations (HCC)   History of liver transplant (HCC)   ESRD on dialysis (HCC)   DVT (deep venous thrombosis) (HCC)   Paroxysmal atrial fibrillation (HCC)  Hepatic encephalopathy: w/ improved mental status. W/ prior liver transplant & w/ recurrent cirrhosis as per CT abd/pelvis. Ammonia level is labile. Was not taking lactulose  at home and has not seen GI specialist since transplant in 2013 as per pt.  Continue on lactulose . MRI brain & CT head shows no acute intracranial abnormalities. EEG showed no seizure or epileptiform discharges. Neuro following and recs apprec   Hx liver transplant: continue on home dose of  tacrolimus , mycophenolate . CT abd/pelvis shows recurrent cirrhosis. Will f/u transplant surg on 12/24/23   ESRD: on HD TTS. Nephro following and recs apprec    HTN: continue on coreg , clonidine . Hold for MAP < 65 and/or HR < 65. Holding hydralazine     Hx of CAD: continue on coreg . Holding home imdur , hydralazine , aspirin    DM2: poorly controlled, HbA1c 11.8. Continue on glargine, SSI w/ accuchecks    DVT:  continue on eliquis     Likely PAF: continue on eliquis , coreg . Hold coreg  for MAP 65 and/or HR < 65.    Morbid obesity: BMI 42.6. Complicates overall care & prognosis   Discharge Instructions  Discharge Instructions     Diet Carb Modified   Complete by: As directed    Discharge instructions   Complete by: As directed    F/u w/ transplant physician in 1-2 days. F/u w/ PCP in 1-2 weeks   Increase activity slowly   Complete by: As directed       Allergies as of 12/23/2023       Reactions   Insulin  Aspart Prot & Aspart Other (See Comments)   Burning on injection and skin reaction.          Medication List     PAUSE taking these medications    amLODipine 10 MG tablet Wait to take this until: January 07, 2024 Commonly known as: NORVASC Take 10 mg by mouth daily.   aspirin  EC 81 MG tablet Wait to take this until: January 07, 2024 Take 1 tablet (81 mg total) by mouth daily. Swallow whole.  cloNIDine  0.1 MG tablet Wait to take this until: January 07, 2024 Commonly known as: CATAPRES  Take 0.1 mg by mouth at bedtime.   isosorbide  mononitrate 30 MG 24 hr tablet Wait to take this until: January 07, 2024 Commonly known as: IMDUR  Take 30 mg by mouth daily.       STOP taking these medications    mycophenolate  250 MG capsule Commonly known as: CELLCEPT        TAKE these medications    Apixaban  Starter Pack (10mg  and 5mg ) Commonly known as: ELIQUIS  STARTER PACK Take as directed on package: start with two-5mg  tablets twice daily for 7 days. On day 8, switch to one-5mg   tablet twice daily.   atorvastatin 40 MG tablet Commonly known as: LIPITOR Take 40 mg by mouth daily.   carvedilol  6.25 MG tablet Commonly known as: COREG  Take 6.25 mg by mouth.  Take 1 tablet (6.25 mg total) by mouth two (2) times a day.   cholecalciferol  25 MCG (1000 UNIT) tablet Commonly known as: VITAMIN D3 Take 1,000 Units by mouth daily.   dextromethorphan -guaiFENesin  30-600 MG 12hr tablet Commonly known as: MUCINEX  DM Take 1 tablet by mouth 2 (two) times daily as needed for cough.   furosemide  40 MG tablet Commonly known as: LASIX  Take 40-80 mg by mouth See admin instructions. Take 1 tablet (40 mg) daily. Take 2 tablets (80 mg) if you weigh >300 lbs   gabapentin 300 MG capsule Commonly known as: NEURONTIN Take 300 mg by mouth. Take 1 capsule (300 mg total) by mouth nightly.   hydrALAZINE  25 MG tablet Commonly known as: APRESOLINE  Take 25 mg by mouth 4 (four) times daily.   insulin  detemir 100 UNIT/ML injection Commonly known as: LEVEMIR  Inject 12 Units into the skin at bedtime.   lactulose  10 GM/15ML solution Commonly known as: CHRONULAC  Take 45 mLs (30 g total) by mouth 2 (two) times daily.   lanthanum 1000 MG chewable tablet Commonly known as: FOSRENOL Chew 1,000 mg by mouth.  Chew 1 tablet (1,000 mg total) Three (3) times a day with a meal.   magnesium  oxide 400 MG tablet Commonly known as: MAG-OX Take 400 mg by mouth daily.   metFORMIN 1000 MG tablet Commonly known as: GLUCOPHAGE Take 1,000 mg by mouth.  Take one tablet (1,000 mg total) by mouth 2 (two) times daily with meals.   Mounjaro 7.5 MG/0.5ML Pen Generic drug: tirzepatide Inject 7.5 mg into the skin.  Inject 7.5 mg under the skin every seven (7) days. What changed: Another medication with the same name was removed. Continue taking this medication, and follow the directions you see here.   multivitamin with minerals Tabs tablet Take 1 tablet by mouth daily.   mycophenolate  180 MG EC  tablet Commonly known as: MYFORTIC  Take 180 mg by mouth 2 (two) times daily.   NovoLOG  FlexPen 100 UNIT/ML FlexPen Generic drug: insulin  aspart Inject 10-30 Units into the skin 3 (three) times daily with meals. Per sliding scale   oxycodone  5 MG capsule Commonly known as: OXY-IR Take 5 mg by mouth every 4 (four) hours as needed.   sevelamer 800 MG tablet Commonly known as: RENAGEL Take 1,600 mg by mouth 3 (three) times daily.   sevelamer carbonate 800 MG tablet Commonly known as: RENVELA Take 1,600 mg by mouth 3 (three) times daily.   tacrolimus  0.5 MG capsule Commonly known as: PROGRAF  Take 0.5 mg by mouth 2 (two) times daily.   Testosterone  20.25 MG/ACT (1.62%) Gel Apply 1 application  topically daily. Use 2 pumps once daily and apply to skin        Allergies  Allergen Reactions   Insulin  Aspart Prot & Aspart Other (See Comments)    Burning on injection and skin reaction.      Consultations: Neuro    Procedures/Studies: CT ABDOMEN PELVIS WO CONTRAST Result Date: 12/21/2023 CLINICAL DATA:  Liver failure. History of liver transplant. Concern for recurring cirrhosis. EXAM: CT ABDOMEN AND PELVIS WITHOUT CONTRAST TECHNIQUE: Multidetector CT imaging of the abdomen and pelvis was performed following the standard protocol without IV contrast. RADIATION DOSE REDUCTION: This exam was performed according to the departmental dose-optimization program which includes automated exposure control, adjustment of the mA and/or kV according to patient size and/or use of iterative reconstruction technique. COMPARISON:  CT without contrast 05/15/2020, CT with contrast 11/08/2020. Reports are unavailable. Both studies from [facility]. Last CT for which a report is available is CT with contrast 09/16/2015. FINDINGS: Lower chest: Calcified granuloma left lower lobe. No lung base infiltrates. The heart is slightly enlarged. There is calcification in the right coronary  artery. Hepatobiliary: A transplanted liver is again noted with capsular nodularity primarily in the left lobe consistent with cirrhosis. This was seen previously. The hepatic portal main vein is normal in caliber. Gallbladder is absent with no biliary dilatation. No liver mass is seen without contrast. Pancreas: Partially atrophic, unchanged. No mass is seen without contrast. Spleen: Unremarkable without contrast. Adrenals/Urinary Tract: Chronic slight nodular thickening both adrenal glands, stable. Bosniak 1 cyst in the posterior right kidney measures 1 cm and 4 Hounsfield units. Bosniak 1 cyst in the superior pole of the right kidney measures 9 mm and -1.2 Hounsfield units. No follow-up imaging recommended. There is contrast in the renal collecting systems probably left over from the CTA head and neck from 3 days ago. Both kidneys small in length with interval generalized cortical volume loss. Right kidney is 8.2 cm in length, left kidney 8.4 cm. There is no hydronephrosis or ureteral stone. No contour deforming mass of the unenhanced kidneys. Contrast in the collecting systems could obscure intrarenal stones. Bladder is contracted and not well seen. Stomach/Bowel: The stomach is filled with fluid and food. No wall thickening. The unopacified small bowel is normal caliber without inflammation. The appendix is normal. There is scattered fluid in the colon without wall thickening or inflammation. Uncomplicated scattered sigmoid diverticula. Vascular/Lymphatic: There are multiple upper abdominal engorged venous varices, lesser sac and right upper abdomen with prominent IVC. This was seen previously and seems unchanged except for perisplenic varices which are less prominent today. There is aortoiliac patchy atherosclerosis without AAA. Circumaortic left renal vein. There is a heterogeneously calcified 2.4 cm splenic artery aneurysm, stable in size and increasingly calcified about the splenic hilum. Reproductive:  Prostate is unremarkable. Other: Small inguinal fat hernias. No incarcerated hernia. No free ascites, free hemorrhage or free air, or focal inflammatory process. Musculoskeletal: There are degenerative changes in the spine, spurring of the SI joints. No acute or significant osseous findings. Acquired spinal stenosis L4-5. IMPRESSION: 1. No acute noncontrast CT findings in the abdomen or pelvis. 2. Transplanted liver with capsular nodularity primarily in the left lobe consistent with cirrhosis. This was seen previously. 3. Upper abdominal venous varices with prominent IVC. This was seen previously and seems unchanged except for perisplenic varices which are less prominent today. 4. Aortic and coronary artery atherosclerosis. 5. 2.4 cm calcified splenic artery aneurysm, stable in size and increasingly calcified about  the splenic hilum. 6. Small inguinal fat hernias. 7. Fluid in the colon which could be due to diarrhea or ileus. No bowel wall thickening or inflammation. 8. Acquired spinal stenosis L4-5. 9. Bosniak 1 cysts in the right kidney. No follow-up imaging recommended. Aortic Atherosclerosis (ICD10-I70.0). Electronically Signed   By: Francis Quam M.D.   On: 12/21/2023 22:21   EEG adult Result Date: 12/19/2023 Shelton Arlin KIDD, MD     12/19/2023  3:38 PM Patient Name: Wayne Parker MRN: [id number] Epilepsy Attending: Arlin KIDD Shelton Referring Physician/Provider: Merrianne Locus, MD Date: 12/19/2023 Duration: 39.11 mins Patient history: 57yo m with ams. EEG to evaluate for seizure. Level of alertness: Awake AEDs during EEG study: None Technical aspects: This EEG study was done with scalp electrodes positioned according to the 10-20 International system of electrode placement. Electrical activity was reviewed with band pass filter of 1-70Hz , sensitivity of 7 uV/mm, display speed of 32mm/sec with a 60Hz  notched filter applied as appropriate. EEG data were recorded continuously and digitally stored.  Video  monitoring was available and reviewed as appropriate. Description: EEG showed continuous generalized 3 to 6 Hz theta-delta slowing. Intermittent 13-15hz  beta activity was noted in fronto-central region. Hyperventilation and photic stimulation were not performed.   ABNORMALITY - Continuous slow, generalized IMPRESSION: This study is suggestive of moderate diffuse encephalopathy. No seizures or epileptiform discharges were seen throughout the recording. Arlin KIDD Shelton   MR BRAIN WO CONTRAST Result Date: 12/19/2023 CLINICAL DATA:  Follow-up examination for stroke. EXAM: MRI HEAD WITHOUT CONTRAST TECHNIQUE: Multiplanar, multiecho pulse sequences of the brain and surrounding structures were obtained without intravenous contrast. COMPARISON:  Prior CTs from 12/18/2023. FINDINGS: Brain: Cerebral volume within normal limits for age. Minimal patchy T2/FLAIR signal abnormality noted involving the right posterior periventricular white matter, nonspecific, but most commonly related to chronic microvascular ischemic disease, overall minimal in nature and less than is typically seen for age. No abnormal foci of restricted diffusion to suggest acute or subacute ischemia. Gray-white matter differentiation maintained. No areas of chronic cortical infarction. No acute or chronic intracranial blood products. No mass lesion, midline shift or mass effect. No hydrocephalus or extra-axial fluid collection. Pituitary gland and suprasellar region within normal limits. Vascular: Major intracranial vascular flow voids are maintained. Skull and upper cervical spine: Craniocervical junction within normal limits. Decreased T1 signal intensity noted within the bone marrow the visualized upper cervical spine, likely related history of end-stage renal disease and anemia. Sinuses/Orbits: Globes orbital soft tissues within normal limits. Paranasal sinuses are largely clear. Trace bilateral mastoid effusions noted, bowel significance. Other:  None. IMPRESSION: Normal brain MRI for age. No acute intracranial abnormality. Electronically Signed   By: Morene Hoard M.D.   On: 12/19/2023 02:20   DG Chest Portable 1 View Result Date: 12/18/2023 CLINICAL DATA:  Altered mental status. EXAM: PORTABLE CHEST 1 VIEW COMPARISON:  December 16, 2023 FINDINGS: Stable cardiomegaly. Both lungs are clear. The visualized skeletal structures are unremarkable. IMPRESSION: No active disease. Electronically Signed   By: Lynwood Landy Raddle M.D.   On: 12/18/2023 13:16   CT ANGIO HEAD NECK W WO CM (CODE STROKE) Result Date: 12/18/2023 CLINICAL DATA:  Acute neurological deficit. EXAM: CT ANGIOGRAPHY HEAD AND NECK WITH AND WITHOUT CONTRAST TECHNIQUE: Multidetector CT imaging of the head and neck was performed using the standard protocol during bolus administration of intravenous contrast. Multiplanar CT image reconstructions and MIPs were obtained to evaluate the vascular anatomy. Carotid stenosis measurements (when applicable) are obtained utilizing NASCET criteria, using  the distal internal carotid diameter as the denominator. RADIATION DOSE REDUCTION: This exam was performed according to the departmental dose-optimization program which includes automated exposure control, adjustment of the mA and/or kV according to patient size and/or use of iterative reconstruction technique. CONTRAST:  75mL OMNIPAQUE  IOHEXOL  350 MG/ML SOLN COMPARISON:  CT of the head dated December 16, 2023. FINDINGS: CT HEAD FINDINGS Brain: Normal. No evidence of hemorrhage, mass, acute cortical infarct or hydrocephalus. Vascular: Unremarkable. Skull: Intact and unremarkable. Sinuses/Orbits: Normal. Other: None. Review of the MIP images confirms the above findings CTA NECK FINDINGS Aortic arch: Aberrant origin of the right subclavian artery. Normal in caliber and unremarkable in appearance otherwise. Right carotid system: Minimal calcification within the carotid bulb. Normal otherwise. Left carotid system:  Minimal calcific plaque in the left carotid bulb. Normal otherwise. Vertebral arteries: Left vertebral artery is dominant and right is relatively hypoplastic. Both are uniform in caliber throughout the respective courses. Skeleton: Mild degenerative changes within the cervical spine. No osseous lesions. Other neck: Negative Upper chest: The lung apices are clear. Review of the MIP images confirms the above findings CTA HEAD FINDINGS Anterior circulation: Mild calcific atheromatous disease within the carotid siphons. No significant stenosis. Absent or hypoplastic left A1 segment. Anterior cerebral arteries are otherwise normal in caliber and unremarkable. The middle cerebral arteries and their branches demonstrate no evidence of aneurysm, large vessel occlusion or flow-limiting stenosis. Posterior circulation: The basilar artery is normal in caliber and unremarkable in appearance. The cerebellar and posterior cerebral arteries and proximal branches are normal in caliber and unremarkable. Venous sinuses: The venous sinuses appear to be widely patent. Anatomic variants: Aplastic or hypoplastic left A1 segment. Review of the MIP images confirms the above findings IMPRESSION: 1. Negative CT angiogram of the head and neck. These results were called by telephone at the time of interpretation on 12/18/2023 at 11:03 am to provider ERIC [facility] , who verbally acknowledged these results. Electronically Signed   By: Evalene Coho M.D.   On: 12/18/2023 11:08   CT HEAD CODE STROKE WO CONTRAST` Result Date: 12/18/2023 CLINICAL DATA:  Code stroke.  Acute neurological deficit. EXAM: CT HEAD WITHOUT CONTRAST TECHNIQUE: Contiguous axial images were obtained from the base of the skull through the vertex without intravenous contrast. RADIATION DOSE REDUCTION: This exam was performed according to the departmental dose-optimization program which includes automated exposure control, adjustment of the mA and/or kV according to patient  size and/or use of iterative reconstruction technique. COMPARISON:  CT of the head dated December 16, 2023. FINDINGS: Brain: On image 20 of series 4, there is questionable loss of the gray-white junction in the right posterior frontal lobe on a single slice. There is no evidence of acute infarction involving the left cerebral hemisphere. The brain otherwise appears normal. No evidence of hemorrhage, mass or hydrocephalus. Vascular: Mild vascular calcifications. Skull: Intact.  No bony lesions present. Sinuses/Orbits: Negative. Other: None. ASPECTS [facility] Stroke Program Early CT Score) - Ganglionic level infarction (caudate, lentiform nuclei, internal capsule, insula, M1-M3 cortex): 7 - Supraganglionic infarction (M4-M6 cortex): 2 Total score (0-10 with 10 being normal): 9 IMPRESSION: 1. Questionable loss of gray-white junction in the right posterior frontal lobe. This may represent volume averaging, however. No evidence of infarction involving the left cerebral hemisphere. 2. ASPECTS is 9 3. These results were called by telephone at the time of interpretation on 12/18/2023 at 10:46 am to provider Dr. Merrianne, who verbally acknowledged these results. Electronically Signed   By: Evalene Coho HERO.D.  On: 12/18/2023 10:59   CT Head Wo Contrast Result Date: 12/16/2023 CLINICAL DATA:  Recent syncopal episode with headaches, initial encounter EXAM: CT HEAD WITHOUT CONTRAST TECHNIQUE: Contiguous axial images were obtained from the base of the skull through the vertex without intravenous contrast. RADIATION DOSE REDUCTION: This exam was performed according to the departmental dose-optimization program which includes automated exposure control, adjustment of the mA and/or kV according to patient size and/or use of iterative reconstruction technique. COMPARISON:  06/02/2020 FINDINGS: Brain: No evidence of acute infarction, hemorrhage, hydrocephalus, extra-axial collection or mass lesion/mass effect. Vascular: No hyperdense  vessel or unexpected calcification. Skull: Normal. Negative for fracture or focal lesion. Sinuses/Orbits: No acute finding. Other: None. IMPRESSION: No acute intracranial abnormality noted. Electronically Signed   By: Oneil Devonshire M.D.   On: 12/16/2023 23:16   DG Chest Port 1 View Result Date: 12/16/2023 CLINICAL DATA:  Syncopal episode EXAM: PORTABLE CHEST 1 VIEW COMPARISON:  07/23/2020 FINDINGS: Cardiac shadow is enlarged but stable. No focal infiltrate or effusion is seen. A few small calcified granulomas are noted on the left. IMPRESSION: No active disease. Electronically Signed   By: Oneil Devonshire M.D.   On: 12/16/2023 23:14   DG Foot 2 Views Right Result Date: 12/16/2023 CLINICAL DATA:  Recent syncopal episode with toe pain, initial encounter EXAM: RIGHT FOOT - 2 VIEW COMPARISON:  None Available. FINDINGS: Somewhat limited by overlying artifact. No acute fracture or dislocation is noted. No soft tissue changes are seen. Calcaneal spurring is noted. IMPRESSION: No acute abnormality seen. Electronically Signed   By: Oneil Devonshire M.D.   On: 12/16/2023 23:13   US  Venous Img Lower Unilateral Right Result Date: 12/08/2023 CLINICAL DATA:  Right calf pain for 3 days. Evaluate for deep venous thrombosis. EXAM: RIGHT LOWER EXTREMITY VENOUS DOPPLER ULTRASOUND TECHNIQUE: Gray-scale sonography with graded compression, as well as color Doppler and duplex ultrasound were performed to evaluate the lower extremity deep venous systems from the level of the common femoral vein and including the common femoral, femoral, profunda femoral, popliteal and calf veins including the posterior tibial, peroneal and gastrocnemius veins when visible. The superficial great saphenous vein was also interrogated. Spectral Doppler was utilized to evaluate flow at rest and with distal augmentation maneuvers in the common femoral, femoral and popliteal veins. COMPARISON:  None Available. FINDINGS: Contralateral Common Femoral Vein:  Respiratory phasicity is normal and symmetric with the symptomatic side. No evidence of thrombus. Normal compressibility. Common Femoral Vein: No evidence of thrombus. Normal compressibility, respiratory phasicity and response to augmentation. Saphenofemoral Junction: No evidence of thrombus. Normal compressibility and flow on color Doppler imaging. Profunda Femoral Vein: No evidence of thrombus. Normal compressibility and flow on color Doppler imaging. Femoral Vein: No evidence of thrombus. Normal compressibility, respiratory phasicity and response to augmentation. Popliteal Vein: The distal portion of the right popliteal vein is noncompressible. There is internal echogenicity within the vein in this region. No color flow. Calf Veins: Limited evaluation due to large body habitus. No definite calf pain thrombosis is seen. Superficial Great Saphenous Vein: No evidence of thrombus. Normal compressibility. Venous Reflux:  None. Other Findings:  None. IMPRESSION: Acute, likely short segment high-grade partial to possibly occlusive deep venous thrombosis in the distal portion of the right popliteal vein. Critical Value/emergent results were called by telephone at the time of interpretation on 12/08/2023 at 6:50 pm to provider Oscar Zelaya, PA-C, who verbally acknowledged these results. Electronically Signed   By: Tanda Lyons M.D.   On: 12/08/2023 18:52   (  Echo, Carotid, EGD, Colonoscopy, ERCP)    Subjective: Pt c/o fatigue    Discharge Exam: Vitals:   12/23/23 0342 12/23/23 0740  BP: (!) 142/70 121/75  Pulse: 62 63  Resp: 16 20  Temp: 98.9 F (37.2 C) 98.2 F (36.8 C)  SpO2: 92% 97%   Vitals:   12/22/23 1517 12/22/23 2016 12/23/23 0342 12/23/23 0740  BP: 99/70 121/70 (!) 142/70 121/75  Pulse: 77 (!) 56 62 63  Resp: 18 16 16 20   Temp: (!) 97.4 F (36.3 C) 98.3 F (36.8 C) 98.9 F (37.2 C) 98.2 F (36.8 C)  TempSrc:  Oral Oral   SpO2: 100% 97% 92% 97%  Weight:      Height:         General: Pt is alert, awake, not in acute distress Cardiovascular:  S1/S2 +, no rubs, no gallops Respiratory: decreased breath sounds b/l  Abdominal: Soft, NT, obese, bowel sounds + Extremities: no cyanosis    The results of significant diagnostics from this hospitalization (including imaging, microbiology, ancillary and laboratory) are listed below for reference.     Microbiology: Recent Results (from the past 240 hours)  Resp panel by RT-PCR (RSV, Flu A&B, Covid) Anterior Nasal Swab     Status: None   Collection Time: 12/18/23 11:56 AM   Specimen: Anterior Nasal Swab  Result Value Ref Range Status   SARS Coronavirus 2 by RT PCR NEGATIVE NEGATIVE Final    Comment: (NOTE) SARS-CoV-2 target nucleic acids are NOT DETECTED.  The SARS-CoV-2 RNA is generally detectable in upper respiratory specimens during the acute phase of infection. The lowest concentration of SARS-CoV-2 viral copies this assay can detect is 138 copies/mL. A negative result does not preclude SARS-Cov-2 infection and should not be used as the sole basis for treatment or other patient management decisions. A negative result may occur with  improper specimen collection/handling, submission of specimen other than nasopharyngeal swab, presence of viral mutation(s) within the areas targeted by this assay, and inadequate number of viral copies(<138 copies/mL). A negative result must be combined with clinical observations, patient history, and epidemiological information. The expected result is Negative.  Fact Sheet for Patients:  BloggerCourse.com  Fact Sheet for Healthcare Providers:  SeriousBroker.it  This test is no t yet approved or cleared by the [place]  FDA and  has been authorized for detection and/or diagnosis of SARS-CoV-2 by FDA under an Emergency Use Authorization (EUA). This EUA will remain  in effect (meaning this test can be used) for the  duration of the COVID-19 declaration under Section 564(b)(1) of the Act, 21 U.S.C.section 360bbb-3(b)(1), unless the authorization is terminated  or revoked sooner.       Influenza A by PCR NEGATIVE NEGATIVE Final   Influenza B by PCR NEGATIVE NEGATIVE Final    Comment: (NOTE) The Xpert Xpress SARS-CoV-2/FLU/RSV plus assay is intended as an aid in the diagnosis of influenza from Nasopharyngeal swab specimens and should not be used as a sole basis for treatment. Nasal washings and aspirates are unacceptable for Xpert Xpress SARS-CoV-2/FLU/RSV testing.  Fact Sheet for Patients: BloggerCourse.com  Fact Sheet for Healthcare Providers: SeriousBroker.it  This test is not yet approved or cleared by the [place]  FDA and has been authorized for detection and/or diagnosis of SARS-CoV-2 by FDA under an Emergency Use Authorization (EUA). This EUA will remain in effect (meaning this test can be used) for the duration of the COVID-19 declaration under Section 564(b)(1) of the Act, 21 U.S.C. section 360bbb-3(b)(1), unless the  authorization is terminated or revoked.     Resp Syncytial Virus by PCR NEGATIVE NEGATIVE Final    Comment: (NOTE) Fact Sheet for Patients: BloggerCourse.com  Fact Sheet for Healthcare Providers: SeriousBroker.it  This test is not yet approved or cleared by the [place]  FDA and has been authorized for detection and/or diagnosis of SARS-CoV-2 by FDA under an Emergency Use Authorization (EUA). This EUA will remain in effect (meaning this test can be used) for the duration of the COVID-19 declaration under Section 564(b)(1) of the Act, 21 U.S.C. section 360bbb-3(b)(1), unless the authorization is terminated or revoked.  Performed at [facility], [address] [place], [place] [address]      Labs: BNP (last 3 results) No results for  input(s): BNP in the last 8760 hours. Basic Metabolic Panel: Recent Labs  Lab 12/16/23 2214 12/18/23 1040 12/19/23 0350 12/20/23 0338 12/21/23 0411 12/22/23 0335 12/23/23 0441  NA 137   < > 135 138 139 134* 132*  K 4.9   < > 4.3 4.2 3.7 3.6 3.5  CL 98   < > 98 100 99 97* 96*  CO2 22   < > 24 25 26 23 25   GLUCOSE 189*   < > 303* 197* 176* 220* 209*  BUN 22*   < > 31* 40* 35* 43* 26*  CREATININE 8.04*   < > 8.03* 10.51* 8.43* 10.23* 7.42*  CALCIUM 10.5*   < > 9.5 9.1 9.3 9.2 8.9  MG 2.4  --   --   --   --   --   --   PHOS  --   --   --   --   --  4.5  --    < > = values in this interval not displayed.   Liver Function Tests: Recent Labs  Lab 12/18/23 1040 12/20/23 0338 12/21/23 0411 12/22/23 0335 12/23/23 0441  AST 41 35 24 21 22   ALT 85* 70* 53* 41 34  ALKPHOS 307* 286* 258* 235* 232*  BILITOT 1.3* 1.4* 1.5* 1.2 1.2  PROT 7.3 6.5 6.9 7.0 6.7  ALBUMIN 3.7 3.0* 3.1* 3.4* 3.1*   No results for input(s): LIPASE, AMYLASE in the last 168 hours. Recent Labs  Lab 12/18/23 1120 12/20/23 0338 12/21/23 0411 12/22/23 0335 12/23/23 0441  AMMONIA 143* 152* 46* 39* 55*   CBC: Recent Labs  Lab 12/18/23 1040 12/19/23 0350 12/20/23 0338 12/21/23 0411 12/22/23 0335 12/23/23 0441  WBC 8.5 5.6 5.2 5.8 6.6 5.6  NEUTROABS 6.1  --   --   --   --   --   HGB 14.3 12.7* 12.4* 13.1 13.3 13.0  HCT 42.8 39.3 38.5* 40.9 41.4 40.0  MCV 88.6 89.5 88.9 89.3 90.2 89.5  PLT 268 235 222 255 273 239   Cardiac Enzymes: No results for input(s): CKTOTAL, CKMB, CKMBINDEX, TROPONINI in the last 168 hours. BNP: Invalid input(s): POCBNP CBG: Recent Labs  Lab 12/22/23 0759 12/22/23 1512 12/22/23 1712 12/22/23 2048 12/23/23 0741  GLUCAP 210* 207* 311* 209* 210*   D-Dimer No results for input(s): DDIMER in the last 72 hours. Hgb A1c No results for input(s): HGBA1C in the last 72 hours. Lipid Profile No results for input(s): CHOL, HDL, LDLCALC, TRIG,  CHOLHDL, LDLDIRECT in the last 72 hours. Thyroid function studies No results for input(s): TSH, T4TOTAL, T3FREE, THYROIDAB in the last 72 hours.  Invalid input(s): FREET3 Anemia work up No results for input(s): VITAMINB12, FOLATE, FERRITIN, TIBC, IRON, RETICCTPCT in the last  72 hours. Urinalysis    Component Value Date/Time   COLORURINE AMBER (A) 07/09/2015 1134   APPEARANCEUR CLEAR 07/09/2015 1134   LABSPEC 1.031 (H) 07/09/2015 1134   PHURINE 5.5 07/09/2015 1134   GLUCOSEU 500 (A) 07/09/2015 1134   HGBUR SMALL (A) 07/09/2015 1134   HGBUR trace-intact 12/07/2008 0854   BILIRUBINUR NEGATIVE 07/09/2015 1134   KETONESUR NEGATIVE 07/09/2015 1134   PROTEINUR 100 (A) 07/09/2015 1134   UROBILINOGEN 4.0 12/07/2008 0854   NITRITE NEGATIVE 07/09/2015 1134   LEUKOCYTESUR NEGATIVE 07/09/2015 1134   Sepsis Labs Recent Labs  Lab 12/20/23 0338 12/21/23 0411 12/22/23 0335 12/23/23 0441  WBC 5.2 5.8 6.6 5.6   Microbiology Recent Results (from the past 240 hours)  Resp panel by RT-PCR (RSV, Flu A&B, Covid) Anterior Nasal Swab     Status: None   Collection Time: 12/18/23 11:56 AM   Specimen: Anterior Nasal Swab  Result Value Ref Range Status   SARS Coronavirus 2 by RT PCR NEGATIVE NEGATIVE Final    Comment: (NOTE) SARS-CoV-2 target nucleic acids are NOT DETECTED.  The SARS-CoV-2 RNA is generally detectable in upper respiratory specimens during the acute phase of infection. The lowest concentration of SARS-CoV-2 viral copies this assay can detect is 138 copies/mL. A negative result does not preclude SARS-Cov-2 infection and should not be used as the sole basis for treatment or other patient management decisions. A negative result may occur with  improper specimen collection/handling, submission of specimen other than nasopharyngeal swab, presence of viral mutation(s) within the areas targeted by this assay, and inadequate number of viral copies(<138  copies/mL). A negative result must be combined with clinical observations, patient history, and epidemiological information. The expected result is Negative.  Fact Sheet for Patients:  BloggerCourse.com  Fact Sheet for Healthcare Providers:  SeriousBroker.it  This test is no t yet approved or cleared by the [place]  FDA and  has been authorized for detection and/or diagnosis of SARS-CoV-2 by FDA under an Emergency Use Authorization (EUA). This EUA will remain  in effect (meaning this test can be used) for the duration of the COVID-19 declaration under Section 564(b)(1) of the Act, 21 U.S.C.section 360bbb-3(b)(1), unless the authorization is terminated  or revoked sooner.       Influenza A by PCR NEGATIVE NEGATIVE Final   Influenza B by PCR NEGATIVE NEGATIVE Final    Comment: (NOTE) The Xpert Xpress SARS-CoV-2/FLU/RSV plus assay is intended as an aid in the diagnosis of influenza from Nasopharyngeal swab specimens and should not be used as a sole basis for treatment. Nasal washings and aspirates are unacceptable for Xpert Xpress SARS-CoV-2/FLU/RSV testing.  Fact Sheet for Patients: BloggerCourse.com  Fact Sheet for Healthcare Providers: SeriousBroker.it  This test is not yet approved or cleared by the [place]  FDA and has been authorized for detection and/or diagnosis of SARS-CoV-2 by FDA under an Emergency Use Authorization (EUA). This EUA will remain in effect (meaning this test can be used) for the duration of the COVID-19 declaration under Section 564(b)(1) of the Act, 21 U.S.C. section 360bbb-3(b)(1), unless the authorization is terminated or revoked.     Resp Syncytial Virus by PCR NEGATIVE NEGATIVE Final    Comment: (NOTE) Fact Sheet for Patients: BloggerCourse.com  Fact Sheet for Healthcare  Providers: SeriousBroker.it  This test is not yet approved or cleared by the [place]  FDA and has been authorized for detection and/or diagnosis of SARS-CoV-2 by FDA under an Emergency Use Authorization (EUA). This EUA will remain in effect (meaning  this test can be used) for the duration of the COVID-19 declaration under Section 564(b)(1) of the Act, 21 U.S.C. section 360bbb-3(b)(1), unless the authorization is terminated or revoked.  Performed at [facility], [address], [place], [place] [address]      Time coordinating discharge: 36 minutes  SIGNED:   Jaikob CHRISTELLA Pouch, MD  [facility] 12/23/2023, 12:27 PM Pager   If 7PM-7AM, please contact night-coverage [email or web address]

## 2023-12-24 ENCOUNTER — Ambulatory Visit: Admit: 2023-12-24 | Discharge: 2023-12-24 | Payer: BLUE CROSS/BLUE SHIELD

## 2023-12-24 DIAGNOSIS — R7989 Other specified abnormal findings of blood chemistry: Principal | ICD-10-CM

## 2023-12-24 DIAGNOSIS — D369 Benign neoplasm, unspecified site: Principal | ICD-10-CM

## 2023-12-24 DIAGNOSIS — Z09 Encounter for follow-up examination after completed treatment for conditions other than malignant neoplasm: Principal | ICD-10-CM

## 2023-12-24 DIAGNOSIS — K7460 Unspecified cirrhosis of liver: Principal | ICD-10-CM

## 2023-12-24 DIAGNOSIS — T8649 Other complications of liver transplant: Principal | ICD-10-CM

## 2023-12-24 DIAGNOSIS — Z944 Liver transplant status: Principal | ICD-10-CM

## 2023-12-24 DIAGNOSIS — R748 Abnormal levels of other serum enzymes: Principal | ICD-10-CM

## 2023-12-24 LAB — COMPREHENSIVE METABOLIC PANEL
ALBUMIN: 3.7 g/dL (ref 3.4–5.0)
ALKALINE PHOSPHATASE: 387 U/L — ABNORMAL HIGH (ref 46–116)
ALT (SGPT): 113 U/L — ABNORMAL HIGH (ref 10–49)
ANION GAP: 16 mmol/L — ABNORMAL HIGH (ref 5–14)
AST (SGOT): 182 U/L — ABNORMAL HIGH (ref <=34)
BILIRUBIN TOTAL: 1.9 mg/dL — ABNORMAL HIGH (ref 0.3–1.2)
BLOOD UREA NITROGEN: 34 mg/dL — ABNORMAL HIGH (ref 9–23)
BUN / CREAT RATIO: 3
CALCIUM: 10 mg/dL (ref 8.7–10.4)
CHLORIDE: 96 mmol/L — ABNORMAL LOW (ref 98–107)
CO2: 22 mmol/L (ref 20.0–31.0)
CREATININE: 10.15 mg/dL — ABNORMAL HIGH (ref 0.73–1.18)
EGFR CKD-EPI (2021) MALE: 6 mL/min/1.73m2 — ABNORMAL LOW (ref >=60)
GLUCOSE RANDOM: 227 mg/dL — ABNORMAL HIGH (ref 70–179)
POTASSIUM: 4.3 mmol/L (ref 3.4–4.8)
PROTEIN TOTAL: 7.4 g/dL (ref 5.7–8.2)
SODIUM: 134 mmol/L — ABNORMAL LOW (ref 135–145)

## 2023-12-24 LAB — CBC W/ AUTO DIFF
BASOPHILS ABSOLUTE COUNT: 0 10*9/L (ref 0.0–0.1)
BASOPHILS RELATIVE PERCENT: 0.5 %
EOSINOPHILS ABSOLUTE COUNT: 0.3 10*9/L (ref 0.0–0.5)
EOSINOPHILS RELATIVE PERCENT: 3.7 %
HEMATOCRIT: 42.8 % (ref 39.0–48.0)
HEMOGLOBIN: 14.1 g/dL (ref 12.9–16.5)
LYMPHOCYTES ABSOLUTE COUNT: 0.8 10*9/L — ABNORMAL LOW (ref 1.1–3.6)
LYMPHOCYTES RELATIVE PERCENT: 11.8 %
MEAN CORPUSCULAR HEMOGLOBIN CONC: 33 g/dL (ref 32.0–36.0)
MEAN CORPUSCULAR HEMOGLOBIN: 29.6 pg (ref 25.9–32.4)
MEAN CORPUSCULAR VOLUME: 89.8 fL (ref 77.6–95.7)
MEAN PLATELET VOLUME: 8.7 fL (ref 6.8–10.7)
MONOCYTES ABSOLUTE COUNT: 0.6 10*9/L (ref 0.3–0.8)
MONOCYTES RELATIVE PERCENT: 9.2 %
NEUTROPHILS ABSOLUTE COUNT: 5.1 10*9/L (ref 1.8–7.8)
NEUTROPHILS RELATIVE PERCENT: 74.8 %
PLATELET COUNT: 277 10*9/L (ref 150–450)
RED BLOOD CELL COUNT: 4.77 10*12/L (ref 4.26–5.60)
RED CELL DISTRIBUTION WIDTH: 15 % (ref 12.2–15.2)
WBC ADJUSTED: 6.9 10*9/L (ref 3.6–11.2)

## 2023-12-24 LAB — MAGNESIUM: MAGNESIUM: 2.4 mg/dL (ref 1.6–2.6)

## 2023-12-24 LAB — GAMMA GT: GAMMA GLUTAMYL TRANSFERASE: 502 U/L — ABNORMAL HIGH (ref 0–73)

## 2023-12-24 LAB — AFP TUMOR MARKER: AFP-TUMOR MARKER: <2 ng/mL (ref <=8)

## 2023-12-24 LAB — PROTIME-INR
INR: 1.12
PROTIME: 12.8 s — ABNORMAL HIGH (ref 9.9–12.6)

## 2023-12-24 LAB — IGM: GAMMAGLOBULIN; IGM: 142 mg/dL (ref 40–230)

## 2023-12-24 LAB — TACROLIMUS LEVEL: TACROLIMUS BLOOD: 18.1 ng/mL

## 2023-12-24 LAB — BILIRUBIN, DIRECT: BILIRUBIN DIRECT: 0.9 mg/dL — ABNORMAL HIGH (ref 0.00–0.30)

## 2023-12-24 LAB — PHOSPHORUS: PHOSPHORUS: 5 mg/dL (ref 2.4–5.1)

## 2023-12-24 MED ORDER — RIFAXIMIN 550 MG TABLET
ORAL_TABLET | Freq: Two times a day (BID) | ORAL | 3 refills | 90.00000 days | Status: CP
Start: 2023-12-24 — End: 2024-12-23

## 2023-12-24 NOTE — Unmapped (Signed)
 EGD  Procedure #1   Colonoscopy  Procedure #2   [id number]  MRN     Endoscopist     Is the patient's health insurance [employer], [employer], [facility]), [employer], [employer], or C[employer]?     Urgent procedure     Are you pregnant?     Are you in the process of scheduling or awaiting results of a heart ultrasound, stress test, or catheterization to evaluate new or worsening chest pain, dizziness, or shortness of breath?   TRUE  Do you take: Plavix (clopidogrel), Coumadin (warfarin), Lovenox (enoxaparin), Pradaxa (dabigatran), Effient (prasugrel), Xarelto (rivaroxaban), Eliquis  (apixaban ), Pletal (cilostazol), or Brilinta (ticagrelor)?          Did ordering provider indicate how long to hold this medication in the order comments?   Eliquis        Which of the above medications are you taking?   Dongola       What is the name of the medical practice that manages this medication?   [facility]       What is the name of the medical provider who manages this medication?     Do you have hemophilia, von Willebrand disease, or low platelets?     Do you have a pacemaker or implanted cardiac defibrillator?     Has a [place] [facility] provider specified the location(s)?     Which location(s) did the [facility] provider specify?          [facility]          [place]          [facility]    TRUE  Do you see a liver specialist for chronic liver disease?     Is the procedure indication for variceal screening?     Is procedure indication for variceal banding (this does NOT include variceal screening)?     Have you had a heart attack, stroke or heart stent placement within the past 6 months?     Month of event     Year of event (ONLY ENTER LAST 2 DIGITS)        5  Height (feet)   9  Height (inches)   265  Weight (pounds)   39.1  BMI          Did the ordering provider specify a bowel prep?          What bowel prep was specified?     Do you have an ostomy (bag on your stomach that collects your stool)?          Is it an ileostomy?          Is it a colostomy?          Patient doesn't know.   TRUE  Do you have chronic kidney disease?     Do you have chronic constipation or have you had poor quality bowel preps for past colonoscopies?     Do you have Crohn's disease or ulcerative colitis?     Have you had weight loss surgery?          When you walk around your house or grocery store, do you have to stop and rest due to shortness of breath, chest pain, or light-headedness?     Do you ever use supplemental oxygen?     Have you been hospitalized for cirrhosis of the liver or heart failure in the last 12 months?     Have you been treated for mouth or throat  cancer with radiation or surgery?     Have you been told that it is difficult for doctors to insert a breathing tube in you during anesthesia?     Have you had a heart or lung transplant?        TRUE  Are you on dialysis?     Do you have cirrhosis of the liver?     Do you have myasthenia gravis?     Is the patient a prisoner?   ################# ## ###################################################################################################################   MRN:  [id number]   Anticoag Review  Yes. Enter Eliquis  and [facility] in 'Notes' section prior to sending to workqueue.   Nurse Triage  No   GI clinic consult  No   Procedure(s):  EGD     Colonoscopy   Endoscopist:  0   Urgent:  No   Prep:  Nulytely  Prep                  --------------------------- --- ----------------------------------------------------------------------------------------------------------------------------------------------------------------------------   [facility]ations:  [facility]     [facility]              Requested Locations:              ################# ## ###################################################################################################################

## 2023-12-24 NOTE — Unmapped (Signed)
 Per provider, the patient received 2 vaccines.  Patient ID verified with name and date of birth.  All screening questions were answered.  Vaccine(s) were administered as ordered.  See immunization history for documentation.  Patient tolerated the injection(s) well with no issues noted.  Vaccine Information sheet given to the patient.

## 2023-12-24 NOTE — Unmapped (Signed)
 [facility] Biopsy Request Information Sheet     Referring Provider:  Aureliano Fairly, MD  Date of Review:  12/24/2023    Reviewing Provider:  Gerard Levorn Gaskins, MD     Requested Biopsy Site:  TJLB with pressures    Reason for Request:  58 year old male with hx of liver transplant with elevated LFTs. Request for TJLB with pressure measurements.    Past Medical History:  Past Medical History[1]    Lab Results   Component Value Date    WBC 6.9 12/24/2023    HGB 14.1 12/24/2023    HCT 42.8 12/24/2023    PLT 277 12/24/2023       Lab Results   Component Value Date    NA 134 (L) 12/24/2023    K 4.3 12/24/2023    CL 96 (L) 12/24/2023    CO2 22.0 12/24/2023    BUN 34 (H) 12/24/2023    CREATININE 10.15 (H) 12/24/2023    GLU 227 (H) 12/24/2023    CALCIUM  10.0 12/24/2023    MG 2.4 12/24/2023    PHOS 5.0 12/24/2023       Lab Results   Component Value Date    BILITOT 1.9 (H) 12/24/2023    BILIDIR 0.90 (H) 12/24/2023    PROT 7.4 12/24/2023    ALBUMIN 3.7 12/24/2023    ALT 113 (H) 12/24/2023    AST 182 (H) 12/24/2023    ALKPHOS 387 (H) 12/24/2023    GGT 502 (H) 12/24/2023       Lab Results   Component Value Date    PT 12.8 (H) 12/24/2023    INR 1.12 12/24/2023    APTT 26.1 04/24/2022         Imaging reviewed:  I reviewed all pertinent diagnostic studies, including:  CT abdomen pelvis 11/23/23    Recommended Imaging Modality to Perform Biopsy:  Fluoroscopy, main hospital    Comments for Biopsy:  accessory right inferior hepatic vein  No need to hold eliquis  per SIR guidelines    Comments from ordering provider:  S/p liver transplant with elevated lfts - need biopsy WITH TJ Pressures          [1]   Past Medical History:  Diagnosis Date    CHF (congestive heart failure)        COVID-19 07/18/2019    Diabetes mellitus        Family history of malignant neoplasm of prostate 06/23/2015    Heart disease     Heart murmur     Hepatic cirrhosis    06/23/2015    Overview:  Secondary to NASH; followed by Dr. Lindaann, GI.  Last Assessment & Plan: Relevant Hx: Course: Daily Update: Today's Plan:     Hypertension     Hypogonadism in male 02/12/2014    Kidney stone     Liver cirrhosis secondary to NASH (nonalcoholic steatohepatitis)        s/p liver transplant 2013    Obesity

## 2023-12-24 NOTE — Unmapped (Addendum)
 6/30 tac level resulted @ 18.1 - patient confirms this is NOT reflective of a true trough thus no adjustments to be made he took meds prior to lab collection.    CT abdomen with and without contrast secured on 7/9 (will be dialyzed day after) - and order for STAT TJ liver biopsy with pressures placed due to elevated lfts in clinic today patient aware.

## 2023-12-24 NOTE — Unmapped (Addendum)
 [facility]  12/24/2023    Reason for visit: Status post liver transplantation on 03/02/2012 (Liver) (11 years 10 months) for Cirrhosis: [name], seen for follow up.    Assessment/Plan:    58 y.o. 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 No known history of rejection or biliary issues. 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    Recent admission for hepatic encephalopathy, and CT during admission consistent with cirrhosis of his graft. Etiology of cirrhosis is likely [name] given poorly controlled T2DM, hypertension, dyslipidemia, and BMI 40. However, ALP significantly elevated with positive AMA today, consistent with PBC. Will proceed with ursodiol  after liver biopsy (needed to rule out concurrent rejection given significant rise in liver biochemistries).    Previous kidney transplant evaluation closed 09/2023 in the setting of elevated A1C, can be re-referred in 6 months per chart review. Unclear if he is a candidate for [facility] transplantation given comorbidities (HFpEF, BMI 40). Hepatic encephalopathy improved. No ascites, EV bleeding, jaundice. MELD 24. Consider transplant evaluation if worsening liver dysfunction or decompensation.    Cirrhosis of transplanted liver     HCC surveillance every 6 months with ultrasound and AFP. 4-phase CT ordered.  Increase protein intake with goal of 100 g of protein per day.  Avoid NSAIDs. Acetaminophen  up to 2 g/day is okay.  First Havrix and Heplisav-B administered today.    Portal hypertension   EGD to evaluate for EV. Would need banding, did not tolerate carvedilol  inpatient.    Tubular adenoma  Colonoscopy ordered.    Hepatic encephalopathy  Continue lactulose  titrated for 3-5 soft stools daily.  Start Xifaxan  twice daily.  Discussed that he should not drive at all due to the increased risk of traffic accidents.  CT to evaluate for splenorenal shunt given preserved liver function (though liver enzymes elevated today).    Elevated liver enzymes  Elevated alkaline phosphatase level  Biopsy to rule out rejection given recent and significant rise in liver biochemistries.  ALP significantly elevated with positive AMA today, consistent with PBC.   Will proceed with ursodiol  after liver biopsy.     Return in about 3 months (around 03/25/2024).    Subjective   History of Present Illness   Accompanied by: sister (on phone)    58 y.o. 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 No known history of rejection or biliary issues. 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    Interval History  Last seen 09/16/2021. Admitted 6/24-6/29/2025 with hepatic encephalopathy, improved on lactulose . No episodes of HE since discharge. He is having 3-8 BMs/day. CT during admission consistent with cirrhosis of his graft. He has edema but denies ascites. Denies jaundice, fever, melena, or hematochezia. Previous kidney transplant evaluation closed 09/2023 in the setting of elevated A1C, can be re-referred in 6 months per chart review.    Objective   Physical Exam   Vital Signs: BP 118/63 (BP Site: L Arm, BP Position: Sitting, BP Cuff Size: X-Large)  - Pulse 72  - Temp 36.8 ??C (98.3 ??F) (Tympanic)  - Ht 172.7 cm (5' 7.99)  - Wt (!) 120.3 kg (265 lb 4.8 oz)  - SpO2 99%  - BMI 40.35 kg/m??   Constitutional: He is in no apparent distress  Eyes: Anicteric sclerae  Cardiovascular: No peripheral edema  Gastrointestinal: Soft, nontender abdomen without  hepatosplenomegaly, hernias, or masses  Neurologic: Awake, alert, and oriented to person, place, and time with normal speech      Results for orders placed or performed in visit on 12/24/23   AFP tumor marker   Result Value Ref Range    AFP-Tumor Marker <2 <=8 ng/mL   Comprehensive Metabolic Panel   Result Value Ref Range    Sodium 134 (L) 135 - 145 mmol/L    Potassium 4.3 3.4 - 4.8 mmol/L    Chloride 96 (L) 98 - 107 mmol/L    CO2 22.0 20.0 - 31.0 mmol/L    Anion Gap 16 (H) 5 - 14 mmol/L    BUN 34 (H) 9 - 23 mg/dL    Creatinine 89.84 (H) 0.73 - 1.18 mg/dL    BUN/Creatinine Ratio 3     eGFR CKD-EPI (2021) Male 6 (L) >=60 mL/min/1.93m2    Glucose 227 (H) 70 - 179 mg/dL    Calcium  10.0 8.7 - 10.4 mg/dL    Albumin 3.7 3.4 - 5.0 g/dL    Total Protein 7.4 5.7 - 8.2 g/dL    Total Bilirubin 1.9 (H) 0.3 - 1.2 mg/dL    AST 817 (H) <=65 U/L    ALT 113 (H) 10 - 49 U/L    Alkaline Phosphatase 387 (H) 46 - 116 U/L   Bilirubin, Direct   Result Value Ref Range    Bilirubin, Direct 0.90 (H) 0.00 - 0.30 mg/dL   Gamma GT (GGT)   Result Value Ref Range    GGT 502 (H) 0 - 73 U/L   Magnesium  Level   Result Value Ref Range    Magnesium  2.4 1.6 - 2.6 mg/dL   Phosphorus Level   Result Value Ref Range    Phosphorus 5.0 2.4 - 5.1 mg/dL   Tacrolimus  level   Result Value Ref Range    Tacrolimus , Timed 18.1 ng/mL   PT-INR   Result Value Ref Range    PT 12.8 (H) 9.9 - 12.6 sec    INR 1.12    IgM   Result Value Ref Range    IgM 142 40 - 230 mg/dL   Antimitochondrial Antibody   Result Value Ref Range    Anti Mitochondrial Ab Positive (A) Negative    Anti-Mitochondrial Level 34.8    CBC w/ Differential   Result Value Ref Range    WBC 6.9 3.6 - 11.2 10*9/L    RBC 4.77 4.26 - 5.60 10*12/L    HGB 14.1 12.9 - 16.5 g/dL    HCT 57.1 60.9 - 51.9 %    MCV 89.8 77.6 - 95.7 fL    MCH 29.6 25.9 - 32.4 pg    MCHC 33.0 32.0 - 36.0 g/dL    RDW 84.9 87.7 - 84.7 %    MPV 8.7 6.8 - 10.7 fL    Platelet 277 150 - 450 10*9/L    Neutrophils % 74.8 %    Lymphocytes % 11.8 %    Monocytes % 9.2 %    Eosinophils % 3.7 %    Basophils % 0.5 %    Absolute Neutrophils 5.1 1.8 - 7.8 10*9/L    Absolute Lymphocytes 0.8 (L) 1.1 - 3.6 10*9/L    Absolute Monocytes 0.6 0.3 - 0.8 10*9/L    Absolute Eosinophils 0.3 0.0 - 0.5 10*9/L    Absolute Basophils 0.0 0.0 - 0.1 10*9/L          MELD 3.0: 24 at 12/24/2023 10:23 AM  Calculated from:  Serum Creatinine: On dialysis. Using the maximum value.  Serum Sodium: 134 mmol/L at 12/24/2023 10:23 AM  Total Bilirubin: 1.9 mg/dL at 3/69/7974 89:76 AM  Serum Albumin: 3.7 g/dL (Using max of 3.5 g/dL) at 3/69/7974 89:76 AM  INR(ratio): 1.12 at 12/24/2023 10:23 AM  Age at listing (hypothetical): 57 years  Sex: Male at 12/24/2023 10:23 AM    I personally spent 45 minutes face-to-face and non-face-to-face in the care of this patient, which includes all pre, intra, and post visit time on the date of service.           ADDENDUM:   30 minutes spent on 01/16/2024 pertaining to clinic visit on 12/24/2023 in reviewing images/pathology in conference and arranging follow-up.     [00641  Prolonged evaluation and management service before and/or after direct patient care; first hour]

## 2023-12-24 NOTE — Unmapped (Addendum)
 [facility]  [facility]  Donald KATHEE Matter, [place]   [facility]  [address]  [place], [place] [address]  Main Clinic: [phone]  Appointment Schedulers: [phone]  Macky Ada, RN: [phone]  Fax: [phone]       Thank you for allowing me to participate in your medical care today. Here are my recommendations based on today's visit:    Call [phone] option 1 to schedule your imaging study at [facility].  Call [phone] option 1 for appointments then option 2 for procedure appointments to schedule an endoscopy and colonoscopy.  Eat a high protein diet (1.2-1.5 grams per kg body weight per day, typically about 100 grams of protein a day). Eat small, frequent meals, high-protein late-night snacks, and use protein supplements such as Glucerna.  Adjust lactulose dose or frequency to produce a target of 3-4 soft bowel movements a day.  We attempt obtain Xifaxan to help with encephalopathy.  You should not drive at all due to the increased risk of traffic accidents.  Avoid NSAIDs (ibuprofen, naproxen, Advil, Motrin, Aleve, Naprosyn, etc). Acetaminophen  (Tylenol ) up to 2000 mg a day is ok  You received your first Hepatitis A (Havrix) and Heplisav-B (Hepatitis B) vaccines today.    Take care,  Donald KATHEE Matter, PA

## 2023-12-25 NOTE — Unmapped (Signed)
 Phone call to schedule patient  Procedure:IR- TJ LIVER BX with Pressures- approved Dr. Gretta-     Date: 01/11/24  Time: 1100am         check In:1000am  Location:[facility] [address] Surgical Building    NPO 8 hours prior of food and/ or tube feeds. Clear liquids 2 hours prior  NO need to hold Eliquis - per Dr. Gretta  Must have adult driver come and stay to drive them home  Date, time, location, and insurance confirmed.

## 2023-12-26 ENCOUNTER — Encounter: Payer: Self-pay | Admitting: Student-PharmD

## 2023-12-26 NOTE — Unmapped (Signed)
 Inbasket message received from Dr. Collie, with Eliquis  hold orders for GI procedure:    Coletti, Chiquita Sine, MD  Jackquline Gift, RN    Hold Eliquis  for 4 days     Previous Messages      ----- Message -----  From: Jackquline Gift, RN  Sent: 12/24/2023   2:02 PM EDT  To: Chiquita Sine Collie, MD  Subject: Eliquis  orders needed                            Dear Dr. Collie,     This patient has an order for an upper endoscopy and colonoscopy. Could you please advise regarding Eliquis  prior to the procedure?    Thank you!     Patient: Richard [name]    DOB: 06/22/1966    MRN: [id number]    Anticoagulant: Eliquis  (Apixaban )    Procedure: Upper Endoscopy and Colonoscopy    Per ASGE Guidelines, GI Procedures recommends patients hold their Eliquis  prior to procedure based on the patient's creatinine clearance (CrCl).    Apixaban  (Eliquis ), CrCl (mL/min) > 60: Hold Eliquis  for 2 days    Apixaban  (Eliquis ), CrCl (mL/min) 30-59: Hold Eliquis  for 3 days    Apixaban  (Eliquis ), CrCl (mL/min) 15-29: Hold Eliquis  for 4 days      Please indicate how long patient should hold their Eliquis .    Please advise if patient should continue their anticoagulant or if you disagree with these recommendations.    If you determine that your patient needs bridging with enoxaparin while off of their anticoagulant, please provide orders and instructions to the patient. Please instruct the patient to stop the  enoxaparin 24 hrs prior to their procedure.       Sincerely,  Gift, RN  [facility] GI Procedures Triage Nurse  P: [phone]  F: [phone]

## 2023-12-26 NOTE — Unmapped (Signed)
 Received call from pt requesting additional information be sent by Dr. Collie to [facility] clinic [phone].     Called and spoke to GI clinic, In-basket message sent to provider on 6/30 requesting Eliquis  hold time prior to procedures. Waiting response to move forward with scheduling upper endoscopy and colonoscopy.

## 2023-12-26 NOTE — Unmapped (Signed)
 [facility] Specialty Medication Onboarding    Specialty Medication: XIFAXAN 550 mg Tab (rifAXIMin)  Prior Authorization: Approved   Financial Assistance: No - copay  <$25  Final Copay/Day Supply: $0 / 90    Insurance Restrictions: None     Notes to Pharmacist:   Credit Card on File: no  Start Date on Rx:      The triage team has completed the benefits investigation and has determined that the patient is able to fill this medication at [facility]. Please contact the patient to complete the onboarding or follow up with the prescribing physician as needed.

## 2023-12-27 DIAGNOSIS — Z944 Liver transplant status: Principal | ICD-10-CM

## 2023-12-27 DIAGNOSIS — Z79899 Other long term (current) drug therapy: Principal | ICD-10-CM

## 2023-12-27 LAB — ANTIMITOCHONDRIAL ANTIBODY
ANTI-MITOCHONDRIAL ANTIBODY: POSITIVE — AB
ANTI-MITOCHONDRIAL LEVEL: 34.8

## 2023-12-27 MED ORDER — PEG 3350-ELECTROLYTES 236 GRAM-22.74 GRAM-6.74 GRAM-5.86 GRAM SOLUTION
0 refills | 0.00000 days | Status: CP
Start: 2023-12-27 — End: ?
  Filled 2023-12-31: qty 1, 30d supply, fill #1
  Filled 2024-01-21: qty 3, 30d supply, fill #0

## 2023-12-31 MED FILL — DEXCOM G6 SENSOR DEVICE: 90 days supply | Qty: 9 | Fill #1

## 2024-01-02 ENCOUNTER — Inpatient Hospital Stay: Admit: 2024-01-02 | Discharge: 2024-01-02 | Payer: BLUE CROSS/BLUE SHIELD

## 2024-01-02 MED ADMIN — iohexol (OMNIPAQUE) 350 mg iodine/mL solution 100 mL: 100 mL | INTRAVENOUS | @ 21:00:00 | Stop: 2024-01-02

## 2024-01-08 DIAGNOSIS — N186 End stage renal disease: Principal | ICD-10-CM

## 2024-01-08 DIAGNOSIS — Z992 Dependence on renal dialysis: Principal | ICD-10-CM

## 2024-01-09 NOTE — Unmapped (Signed)
 VIR pre TJLB prep call completed. Reviewed to register at 1000 on ground floor of Surgical hospital then proceed [facility].  Informed of no show/late cancellation policy. NPO guidelines reviewed. Pt OK to take sips of clear liquids with all AM meds.  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 Made aware of visitation policy.  Pt verbalized understanding. All questions answered.     Diabetic medication guidelines reviewed.

## 2024-01-09 NOTE — Unmapped (Unsigned)
 [facility] Clinical Assessment & Refill Coordination Note    Richard [name], DOB: [birth year]  Phone: There are no phone numbers on file.    All above HIPAA information was verified with patient.     Was a Nurse, learning disability used for this call? No    Specialty Medication(s):   Transplant: mycophenolate  mofetil 250mg  and Prograf  0.5mg      Current Medications[1]     Changes to medications: Richard [name] reports no changes at this time.    Medication list has been reviewed and updated in Epic: Yes    Allergies[2]    Changes to allergies: No    Allergies have been reviewed and updated in Epic: Yes    SPECIALTY MEDICATION ADHERENCE     Mycophenolate  250 mg: 10 days of medicine on hand   Prograf  0.5 mg: 10 days of medicine on hand       Medication Adherence    Patient reported X missed doses in the last month: 0  Specialty Medication: Mycophenolate  250mg   Patient is on additional specialty medications: Yes  Additional Specialty Medications: Prograf  0.5mg   Patient Reported Additional Medication X Missed Doses in the Last Month: 0  Patient is on more than two specialty medications: No  Adherence tools used: patient uses a pill box to manage medications          Specialty medication(s) dose(s) confirmed: Regimen is correct and unchanged.     Are there any concerns with adherence? No    Adherence counseling provided? Not needed    CLINICAL MANAGEMENT AND INTERVENTION      Clinical Benefit Assessment:    Do you feel the medicine is effective or helping your condition? Yes    Clinical Benefit counseling provided? Not needed    Adverse Effects Assessment:    Are you experiencing any side effects? No    Are you experiencing difficulty administering your medicine? No    Quality of Life Assessment:    Quality of Life    Rheumatology  Oncology  Dermatology  Cystic Fibrosis          How many days over the past month did your liver transplant  keep you from your normal activities? For example, brushing your teeth or getting up in the morning. 0    Have you discussed this with your provider? Not needed    Acute Infection Status:    Acute infections noted within Epic:  No active infections    Patient reported infection: None    Therapy Appropriateness:    Is therapy appropriate based on current medication list, adverse reactions, adherence, clinical benefit and progress toward achieving therapeutic goals? Yes, therapy is appropriate and should be continued     Clinical Intervention:    Was an intervention completed as part of this clinical assessment? No    DISEASE/MEDICATION-SPECIFIC INFORMATION      N/A    Solid Organ Transplant: Not Applicable    PATIENT SPECIFIC NEEDS     Does the patient have any physical, cognitive, or cultural barriers? No    Is the patient high risk? Yes, patient is taking a REMS drug. Medication is dispensed in compliance with REMS program    Does the patient require physician intervention or other additional services (i.e., nutrition, smoking cessation, social work)? No    Does the patient have an additional or emergency contact listed in their chart? Yes    SOCIAL DETERMINANTS OF HEALTH     At the [facility], we have learned that life circumstances - like trouble affording food, housing, utilities, or transportation can affect the health of many of our patients.   That is why we wanted to ask: are you currently experiencing any life circumstances that are negatively impacting your health and/or quality of life? Patient declined to answer    Social Drivers of Health     Food Insecurity: No Food Insecurity (12/18/2023)    Received from [facility]    Hunger Vital Sign     Within the past 12 months, you worried that your food would run out before you got the money to buy more.: Never true     Within the past 12 months, the food you bought just didn't last and you didn't have money to get more.: Never true   Tobacco Use: Medium Risk (01/14/2024)    Patient History     Smoking Tobacco Use: Former Smokeless Tobacco Use: Never     Passive Exposure: Past   Transportation Needs: No Transportation Needs (12/18/2023)    Received from [facility] - Transportation     In the past 12 months, has lack of transportation kept you from medical appointments or from getting medications?: No     In the past 12 months, has lack of transportation kept you from meetings, work, or from getting things needed for daily living?: No   Alcohol Use: Not At Risk (11/09/2023)    Alcohol Use     How often do you have a drink containing alcohol?: Never     How many drinks containing alcohol do you have on a typical day when you are drinking?: Not on file     How often do you have 5 or more drinks on one occasion?: Never   Housing: Patient Declined (10/01/2023)    Housing     Within the past 12 months, have you ever stayed: outside, in a car, in a tent, in an overnight shelter, or temporarily in someone else's home (i.e. couch-surfing)?: Patient refused     Are you worried about losing your housing?: Patient refused   Physical Activity: Insufficiently Active (07/29/2020)    Received from [facility] visits prior to 08/26/2022.    Exercise Vital Sign     On average, how many days per week do you engage in moderate to strenuous exercise (like a brisk walk)?: 2 days     On average, how many minutes do you engage in exercise at this level?: 20 min   Utilities: Not At Risk (12/18/2023)    Received from [facility] Utilities     In the past 12 months has the electric, gas, oil, or water company threatened to shut off services in your home?: No   Stress: No Stress Concern Present (07/29/2020)    Received from [facility] visits prior to 08/26/2022.    [employer] of Occupational Health - Occupational Stress Questionnaire     Feeling of Stress : Not at all   Interpersonal Safety: Patient Declined (10/01/2023)    Interpersonal Safety     Unsafe Where You Currently Live: Patient Declined Physically Hurt by Anyone: Patient Declined     Abused by Anyone: Patient Declined   Substance Use: Not on file (05/02/2023)   Intimate Partner Violence: Not At Risk (01/11/2024)    Humiliation, Afraid, Rape, and Kick questionnaire     Fear of Current or Ex-Partner: No  Emotionally Abused: No     Physically Abused: No     Sexually Abused: No   Social Connections: Unknown (07/10/2022)    Received from [facility]    Social Network     Social Network: Not on file   Financial Resource Strain: Low Risk  (11/17/2022)    Overall Financial Resource Strain (CARDIA)     Difficulty of Paying Living Expenses: Not very hard   Health Literacy: Low Risk  (11/09/2023)    Health Literacy     : Never   Internet Connectivity: Not on file       Would you be willing to receive help with any of the needs that you have identified today? Not applicable       SHIPPING     Specialty Medication(s) to be Shipped:   Transplant: None at this time. Pt is [facility] all [facility]s [facility] at [facility]    Other medication(s) to be shipped: No additional medications requested for fill at this time     Changes to insurance: No    Cost and Payment: n/a, pt is picking up meds at [facility] this month    Delivery Scheduled: n/a         The patient will receive a drug information handout for each medication shipped and additional FDA Medication Guides as required.  Verified that patient has previously received a Conservation officer, historic buildings and a Surveyor, mining.    The patient or caregiver noted above participated in the development of this care plan and knows that they can request review of or adjustments to the care plan at any time.      All of the patient's questions and concerns have been addressed.    Harlene Gal, PharmD   [facility] Specialty Pharmacist         [1]   Current Outpatient Medications   Medication Sig Dispense Refill    amlodipine  (NORVASC ) 10 MG tablet Take 1 tablet (10 mg total) by mouth daily. (Patient not taking: Reported on 01/11/2024) 90 tablet 3    apixaban  (ELIQUIS ) 5 mg Tab Take 1 tablet (5 mg total) by mouth two (2) times a day. 180 tablet 3    atorvastatin  (LIPITOR ) 40 MG tablet Take 1 tablet (40 mg total) by mouth daily. (Patient not taking: Reported on 01/11/2024) 100 tablet 3    blood-glucose meter,continuous (DEXCOM G6 RECEIVER) Misc 1 each by Miscellaneous route in the morning. Dispense 1 receiver annually.  Sent to [facility]. 1 each 1    blood-glucose sensor (DEXCOM G7 SENSOR) Devi Use 1 sensor every 10 days. 3 each 11    blood-glucose transmitter (DEXCOM G6 TRANSMITTER) Devi 1 each by Miscellaneous route Every three (3) months. [facility] pharmacy      blood-glucose transmitter (DEXCOM G6 TRANSMITTER) Devi Use as directed to monitor blood glucose 1 each 3    carvedilol  (COREG ) 12.5 MG tablet Take 1 tablet (12.5 mg total) by mouth two (2) times a day. Patient unsure about dosage (Patient not taking: Reported on 01/14/2024)      CHOLECALCIFEROL, VITAMIN D3, (VITAMIN D3 ORAL) Take 2,000 Units by mouth daily.      gabapentin  (NEURONTIN ) 300 MG capsule Take 1 capsule (300 mg total) by mouth nightly. 30 capsule 12    insulin  aspart (NOVOLOG  FLEXPEN U-100 INSULIN ) 100 unit/mL (3 mL) injection pen Inject 0.35 mL (35 Units total) under the skin Three (3) times a day before meals. 60 mL 0  insulin  degludec (TRESIBA  FLEXTOUCH U-200) 200 unit/mL (3 mL) InPn Inject 0.3 mL (60 Units total) under the skin daily. Max dose of 100 units per day. 27 mL 12    lactulose  10 gram/15 mL solution Take 45 mL (30 g total) by mouth two (2) times a day.      lanthanum  (FOSRENOL ) 1000 MG chewable tablet Chew 1 tablet (1,000 mg total) Three (3) times a day with a meal. 90 tablet 11    magnesium  oxide (MAG-OX) 400 mg (241.3 mg elemental magnesium ) tablet Take 1 tablet (400 mg total) by mouth daily.      multivitamin (TAB-A-VITE/THERAGRAN) per tablet Take 1 tablet by mouth daily.      mycophenolate  (CELLCEPT ) 250 mg capsule Take 1 capsule (250 mg total) by mouth two (2) times a day. 60 capsule 11    oxyCODONE  (ROXICODONE ) 5 MG immediate release tablet Take 1 tablet (5 mg total) by mouth every four (4) hours as needed for pain. (Patient not taking: Reported on 01/14/2024) 10 tablet 0    pen needle, diabetic (TRUEPLUS PEN NEEDLE) 32 gauge x 5/32 (4 mm) Ndle Use with insulin  up to 4 times a day as needed. 100 each 0    pen needle, diabetic 32 gauge x 5/32 (4 mm) Ndle Use with insulin  up to 4 times/day as needed. 100 each 0    polyethylene glycol (GOLYTELY ) 236-22.74-6.74 gram solution Take by mouth as directed per [facility] prep instructions, for split bowel prep. 4000 mL 0    rifAXIMin  (XIFAXAN ) 550 mg Tab Take 1 tablet (550 mg total) by mouth two (2) times a day. 180 tablet 3    sildenafil (VIAGRA) 50 MG tablet Take 1 tablet (50 mg total) by mouth daily as needed for erectile dysfunction. (Patient not taking: Reported on 10/01/2023)      tacrolimus  (PROGRAF ) 0.5 MG capsule Take 2 capsules (1 mg total) by mouth two (2) times a day. 360 capsule 3    tirzepatide  (MOUNJARO ) 12.5 mg/0.5 mL PnIj Inject 12.5 mg under the skin every seven (7) days. 2 mL 0    [START ON 02/11/2024] tirzepatide  (MOUNJARO ) 15 mg/0.5 mL PnIj Inject 15 mg under the skin every seven (7) days. 2 mL 3     No current facility-administered medications for this visit.   [2] No Known Allergies mL) injection pen Inject 0.35 mL (35 Units total) under the skin Three (3) times a day before meals. 60 mL 0    insulin  detemir U-100 (LEVEMIR  FLEXPEN) 100 unit/mL (3 mL) injection pen Inject 0.18 mL (18 Units total) under the skin nightly. 15 mL 3    lactulose  10 gram/15 mL solution Take 45 mL (30 g total) by mouth two (2) times a day.      lanthanum  (FOSRENOL ) 1000 MG chewable tablet Chew 1 tablet (1,000 mg total) Three (3) times a day with a meal. 90 tablet 11    magnesium  oxide (MAG-OX) 400 mg (241.3 mg elemental magnesium ) tablet Take 1 tablet (400 mg total) by mouth daily.      multivitamin (TAB-A-VITE/THERAGRAN) per tablet Take 1 tablet by mouth daily.      mycophenolate  (CELLCEPT ) 250 mg capsule Take 1 capsule (250 mg total) by mouth two (2) times a day. 60 capsule 11    pen needle, diabetic (TRUEPLUS PEN NEEDLE) 32 gauge x 5/32 (4 mm) Ndle Use with insulin  up to 4 times a day as needed. 100 each 0    pen needle, diabetic 32 gauge x  5/32 (4 mm) Ndle Use with insulin  up to 4 times/day as needed. 100 each 0    polyethylene glycol (GOLYTELY ) 236-22.74-6.74 gram solution Take by mouth as directed per [facility] GI prep instructions, for split bowel prep. 4000 mL 0    rifAXIMin  (XIFAXAN ) 550 mg Tab Take 1 tablet (550 mg total) by mouth two (2) times a day. 180 tablet 3    sildenafil (VIAGRA) 50 MG tablet Take 1 tablet (50 mg total) by mouth daily as needed for erectile dysfunction. (Patient not taking: Reported on 10/01/2023)      tacrolimus  (PROGRAF ) 0.5 MG capsule Take 2 capsules (1 mg total) by mouth two (2) times a day. 360 capsule 3    tirzepatide  (MOUNJARO ) 7.5 mg/0.5 mL PnIj Inject 7.5 mg under the skin every seven (7) days. 2 mL 2     No current facility-administered medications for this visit.   [2] No Known Allergies

## 2024-01-11 ENCOUNTER — Encounter: Admit: 2024-01-11 | Discharge: 2024-01-11 | Payer: BLUE CROSS/BLUE SHIELD

## 2024-01-11 ENCOUNTER — Inpatient Hospital Stay: Admit: 2024-01-11 | Discharge: 2024-01-11 | Payer: BLUE CROSS/BLUE SHIELD

## 2024-01-11 DIAGNOSIS — R7989 Other specified abnormal findings of blood chemistry: Principal | ICD-10-CM

## 2024-01-11 DIAGNOSIS — Z944 Liver transplant status: Principal | ICD-10-CM

## 2024-01-11 DIAGNOSIS — Z79899 Other long term (current) drug therapy: Principal | ICD-10-CM

## 2024-01-11 LAB — CBC W/ AUTO DIFF
BASOPHILS ABSOLUTE COUNT: 0 10*9/L (ref 0.0–0.1)
BASOPHILS RELATIVE PERCENT: 0.7 %
EOSINOPHILS ABSOLUTE COUNT: 0.2 10*9/L (ref 0.0–0.5)
EOSINOPHILS RELATIVE PERCENT: 3 %
HEMATOCRIT: 43.5 % (ref 39.0–48.0)
HEMOGLOBIN: 14.1 g/dL (ref 12.9–16.5)
LYMPHOCYTES ABSOLUTE COUNT: 0.7 10*9/L — ABNORMAL LOW (ref 1.1–3.6)
LYMPHOCYTES RELATIVE PERCENT: 13.6 %
MEAN CORPUSCULAR HEMOGLOBIN CONC: 32.4 g/dL (ref 32.0–36.0)
MEAN CORPUSCULAR HEMOGLOBIN: 28.6 pg (ref 25.9–32.4)
MEAN CORPUSCULAR VOLUME: 88.2 fL (ref 77.6–95.7)
MEAN PLATELET VOLUME: 8.9 fL (ref 6.8–10.7)
MONOCYTES ABSOLUTE COUNT: 0.4 10*9/L (ref 0.3–0.8)
MONOCYTES RELATIVE PERCENT: 8.3 %
NEUTROPHILS ABSOLUTE COUNT: 3.9 10*9/L (ref 1.8–7.8)
NEUTROPHILS RELATIVE PERCENT: 74.4 %
PLATELET COUNT: 210 10*9/L (ref 150–450)
RED BLOOD CELL COUNT: 4.93 10*12/L (ref 4.26–5.60)
RED CELL DISTRIBUTION WIDTH: 13.7 % (ref 12.2–15.2)
WBC ADJUSTED: 5.3 10*9/L (ref 3.6–11.2)

## 2024-01-11 LAB — MAGNESIUM: MAGNESIUM: 2.1 mg/dL (ref 1.6–2.6)

## 2024-01-11 LAB — COMPREHENSIVE METABOLIC PANEL
ALBUMIN: 3.5 g/dL (ref 3.4–5.0)
ALKALINE PHOSPHATASE: 226 U/L — ABNORMAL HIGH (ref 46–116)
ALT (SGPT): 27 U/L (ref 10–49)
ANION GAP: 13 mmol/L (ref 5–14)
AST (SGOT): 23 U/L (ref <=34)
BILIRUBIN TOTAL: 1.4 mg/dL — ABNORMAL HIGH (ref 0.3–1.2)
BLOOD UREA NITROGEN: 17 mg/dL (ref 9–23)
BUN / CREAT RATIO: 3
CALCIUM: 9.6 mg/dL (ref 8.7–10.4)
CHLORIDE: 97 mmol/L — ABNORMAL LOW (ref 98–107)
CO2: 27 mmol/L (ref 20.0–31.0)
CREATININE: 6.64 mg/dL — ABNORMAL HIGH (ref 0.73–1.18)
EGFR CKD-EPI (2021) MALE: 9 mL/min/1.73m2 — ABNORMAL LOW (ref >=60)
GLUCOSE RANDOM: 258 mg/dL — ABNORMAL HIGH (ref 70–99)
POTASSIUM: 3.6 mmol/L (ref 3.4–4.8)
PROTEIN TOTAL: 7.4 g/dL (ref 5.7–8.2)
SODIUM: 137 mmol/L (ref 135–145)

## 2024-01-11 LAB — BILIRUBIN, DIRECT: BILIRUBIN DIRECT: 0.5 mg/dL — ABNORMAL HIGH (ref 0.00–0.30)

## 2024-01-11 LAB — POTASSIUM, WHOLE BLOOD: POTASSIUM WHOLE BLOOD: 3.5 mmol/L (ref 3.4–4.6)

## 2024-01-11 LAB — TACROLIMUS LEVEL: TACROLIMUS BLOOD: 8.9 ng/mL

## 2024-01-11 LAB — PHOSPHORUS: PHOSPHORUS: 3.1 mg/dL (ref 2.4–5.1)

## 2024-01-11 LAB — GAMMA GT: GAMMA GLUTAMYL TRANSFERASE: 192 U/L — ABNORMAL HIGH (ref 0–73)

## 2024-01-11 MED ORDER — RIFAXIMIN 550 MG TABLET
ORAL_TABLET | Freq: Two times a day (BID) | ORAL | 3 refills | 90.00000 days | Status: CP
Start: 2024-01-11 — End: 2025-01-10
  Filled 2024-01-11: qty 180, 90d supply, fill #0

## 2024-01-11 MED ADMIN — succinylcholine chloride (ANECTINE) injection: INTRAVENOUS | @ 16:00:00 | Stop: 2024-01-11

## 2024-01-11 MED ADMIN — dexmedeTOMIDine (Precedex) injection: INTRAVENOUS | @ 18:00:00 | Stop: 2024-01-11

## 2024-01-11 MED ADMIN — dexAMETHasone (DECADRON) 4 mg/mL injection: INTRAVENOUS | @ 16:00:00 | Stop: 2024-01-11

## 2024-01-11 MED ADMIN — fentaNYL (PF) (SUBLIMAZE) injection: INTRAVENOUS | @ 16:00:00 | Stop: 2024-01-11

## 2024-01-11 MED ADMIN — ROCuronium (ZEMURON) injection: INTRAVENOUS | @ 18:00:00 | Stop: 2024-01-11

## 2024-01-11 MED ADMIN — ROCuronium (ZEMURON) injection: INTRAVENOUS | @ 16:00:00 | Stop: 2024-01-11

## 2024-01-11 MED ADMIN — Propofol (DIPRIVAN) injection: INTRAVENOUS | @ 16:00:00 | Stop: 2024-01-11

## 2024-01-11 MED ADMIN — lactated Ringers infusion: INTRAVENOUS | @ 16:00:00 | Stop: 2024-01-11

## 2024-01-11 MED ADMIN — sugammadex (BRIDION) injection: INTRAVENOUS | @ 19:00:00 | Stop: 2024-01-11

## 2024-01-11 MED ADMIN — dexmedeTOMIDine (Precedex) injection: INTRAVENOUS | @ 16:00:00 | Stop: 2024-01-11

## 2024-01-11 MED ADMIN — diazePAM (VALIUM) tablet 5 mg: 5 mg | ORAL | @ 15:00:00 | Stop: 2024-01-11

## 2024-01-11 MED ADMIN — ondansetron (ZOFRAN) injection: INTRAVENOUS | @ 18:00:00 | Stop: 2024-01-11

## 2024-01-11 MED ADMIN — fentaNYL (PF) (SUBLIMAZE) injection 25 mcg: 25 ug | INTRAVENOUS | @ 20:00:00 | Stop: 2024-01-11

## 2024-01-11 MED ADMIN — oxyCODONE (ROXICODONE) immediate release tablet 5 mg: 5 mg | ORAL | @ 20:00:00 | Stop: 2024-01-11

## 2024-01-11 MED ADMIN — iohexol (OMNIPAQUE) 300 mg iodine/mL solution 130 mL: 130 mL | INTRAVENOUS | @ 19:00:00 | Stop: 2024-01-11

## 2024-01-11 MED ADMIN — midazolam (VERSED) injection: INTRAVENOUS | @ 16:00:00 | Stop: 2024-01-11

## 2024-01-11 MED ADMIN — lidocaine (PF) (XYLOCAINE-MPF) 20 mg/mL (2 %) injection: INTRAVENOUS | @ 16:00:00 | Stop: 2024-01-11

## 2024-01-11 NOTE — Unmapped (Addendum)
 Noted 7/18 tac high @ 8.9 - attempted to reach patient to inquire if labs reflective of true trough but no answer (patient still in VIR post biopsy) - sent MyChart message.  He returned call to confirm he DID take tac prior to lab draw thus NOT a true trough.  Encouraged repeat labs one AM prior to meds - he verbalized agreement.    Discussed lfts improved on labs today - pending PA Minnie reply regarding CT and pending report of pressure measurements and liver biopsy.    Confirms he will pick up xifaxin @ [facility] today after leaving VIR - using lactulose  once daily currently.

## 2024-01-11 NOTE — Unmapped (Signed)
 Forwarded patient's recent CT scan results to PA Minnie (pending her reply).  Updated PA lfts improved today (patient had not repeated as directed prior to biopsy).      TJ biopsy with pressures in progress to evaluate portal hypertension.  Noted patient has not picked up xifaxin from [facility] scripted @ recent visit - unable to reach patient (or sister) by phone thus sent MyChart message reminding him to pick this up @ [facility] today.

## 2024-01-11 NOTE — Unmapped (Cosign Needed)
 [place] [facility] INTERVENTIONAL RADIOLOGY - Operative Note     VIR Post-Procedure Note    Procedure Name: Transjugular liver biopsy with pressure measurements converted to US  guided percutaneous     Pre-Op Diagnosis: Concern for transplant liver cirrhosis    Post-Op Diagnosis: Same as pre-operative diagnosis    VIR Providers    Attending Physician: Dr. Percilla Blender    Fellow/Resident: Antonina SHAUNNA Camp, MD    Time out: Prior to the procedure, a time out was performed with all team members present. During the time out, the patient, procedure and procedure site when applicable were verbally verified by the team members and Antonina SHAUNNA Camp, MD.    Description of procedure:   - Patient with history of liver transplant with unconventional anatomy. Multiple attempts were made to access the right hepatic vein prior to single successful attempt.   - Wedge and free hepatic venous pressures were able to be measured.   - Wedge hepatic venous pressure 20   - Free hepatic venous pressure 16   - RA pressure 7   - HVPG 4  - Decision was made to convert to US  guided non-targeted percutaneous liver biopsy as the patient reports stopping eliquis  for 5 days. A total of 3 passes were made with two 18G x 1 cm cores obtained. Samples were placed in formalin and sent to laboratory for analysis. Results pending.  - Gelfoam was administered at the end of biopsy for hemostasis. No significant hematoma was seen on post-procedure sonography.  - Of note, prior to the case, the patient was extremely anxious and distressed. Initial plans for moderate sedation were aborted and case was performed under general anesthesia.    Sedation:General Anesthesia    Estimated Blood Loss: approximately 5 mL  Complications: None    Plan:   - The patient may be discharged home after 2 hours post-procedure.    See detailed procedure note with images in PACS [facility]).    The patient tolerated the procedure well without incident or complication and left the room in stable\ condition.    Antonina SHAUNNA Camp, MD  01/11/2024 2:51 PM

## 2024-01-11 NOTE — Unmapped (Cosign Needed)
 [facility] - Pre Procedure H/P  Patient name: Richard [name]  CSN: [id number]  MRN: [id number]  Date of Procedure: @TODAY @      Assessment/Plan:    [name] is a 58 y.o. male who will undergo transjugular liver biopsy with pressure measurments in Interventional Radiology.    Consent obtained in the Pre Op holding area by Antonina Camp, MD.  Risks, benefits, and alternatives including but not limited to bleeding, infection, and damage to nearby structures were discussed with patient/patient's representative. All questions were answered to patient/patient's representative satisfaction.  Patient/Patient's representative consents and would like to proceed with the procedure.   --The patient will accept blood products in an emergent situation.  --The patient does not have a Do Not Resuscitate order in effect.      HPI: [name] is a 58 y.o. male with history of [name] cirrhosis s/p liver transplant with concern of cirrhosis of graft and elevated LFTs. Patient presents for transjugular liver biopsy with pressure measurements. Case will be performed under general anesthesia.    Past Medical History[1]    Past Surgical History[2]     Allergies: Allergies[3]    Medications:  Eliquis  (held 5 days ago)    ASA Grade: ASA 4 - Patient with severe systemic disease that is a constant threat to life    PE:    Vitals:    01/11/24 1016   BP: 157/98   Pulse: 76   Resp: 20   Temp: 37.1 ??C (98.7 ??F)   SpO2: 96%     General: male in NAD.  Airway assessment: Class 3 - Can visualize soft palate  Lungs: Respirations nonlabored             [1]   Past Medical History:  Diagnosis Date    CHF (congestive heart failure)        COVID-19 07/18/2019    Diabetes mellitus        Family history of malignant neoplasm of prostate 06/23/2015    Heart disease     Heart murmur     Hepatic cirrhosis    06/23/2015    Overview:  Secondary to NASH; followed by Dr. Lindaann, GI.  Last Assessment & Plan:  Relevant Hx: Course: Daily Update: Today's Plan:     Hypertension     Hypogonadism in male 02/12/2014    Kidney stone     Liver cirrhosis secondary to NASH (nonalcoholic steatohepatitis)        s/p liver transplant 2013    Obesity    [2]   Past Surgical History:  Procedure Laterality Date    CHOLECYSTECTOMY      liver transpant      LIVER TRANSPLANTATION  06/27/2011    PR AV FIST REVISE GRFT,W THROMBECTOMY Right 01/15/2023    Procedure: REVISION, ARTERIOVENOUS FISTULA W/ THROMBECTOMY, AUTOGENOUS OR NONAUTOGENOUS DIALYSIS GRAFT (SEP. PROC), LOWER EXTREMITY;  Surgeon: Marchelle Kitchens, MD;  Location: [facility] OR [facility];  Service: General Surgery    PR COLONOSCOPY W/BIOPSY SINGLE/MULTIPLE N/A 08/13/2018    Procedure: COLONOSCOPY, FLEXIBLE, PROXIMAL TO SPLENIC FLEXURE; WITH BIOPSY, SINGLE OR MULTIPLE;  Surgeon: Thedora Alm Plain, MD;  Location: [facility];  Service: Gastroenterology    PR COLSC FLX W/RMVL OF TUMOR POLYP LESION SNARE TQ N/A 08/13/2018    Procedure: COLONOSCOPY FLEX; W/REMOV TUMOR/LES BY SNARE;  Surgeon: Thedora Alm Plain, MD;  Location: [facility];  Service: Gastroenterology    PR CREAT AV FISTULA,NON-AUTOGENOUS GRAFT Right 11/29/2022  Procedure: CREATE AV FISTULA (SEPARATE PROC); NONAUTOGENOUS GRAFT (EG, BIOLOGICAL COLLAGEN, THERMOPLASTIC GRAFT), UPPER EXTREMITY;  Surgeon: Marchelle Kitchens, MD;  Location: [facility] OR [facility];  Service: General Surgery    PR UPPER GI ENDOSCOPY,BIOPSY N/A 07/13/2015    Procedure: UGI ENDOSCOPY; WITH BIOPSY, SINGLE OR MULTIPLE;  Surgeon: Elspeth Jerilynn Reek, MD;  Location: [facility];  Service: Gastroenterology    PR UPPER GI ENDOSCOPY,BIOPSY N/A 02/13/2023    Procedure: UGI ENDOSCOPY; WITH BIOPSY, SINGLE OR MULTIPLE;  Surgeon: Minnie Krystal Claude, MD;  Location: [facility];  Service: Gastroenterology   [3] No Known Allergies

## 2024-01-12 MED ORDER — OXYCODONE 5 MG TABLET
ORAL_TABLET | ORAL | 0 refills | 2.00000 days | Status: CP | PRN
Start: 2024-01-12 — End: ?

## 2024-01-12 NOTE — Unmapped (Signed)
 VIR TREATMENT PLAN    Received page to call patient. Patient is s/p transjugular hepatic pressure measurements and percutaneous non-targeted liver biopsy on 01/11/2024 without complication.     He reports pain that is not controlled with Tylenol  (limited dosing given liver dysfunction) and advil. He denies lightheadedness, dizziness, fatigue or other signs or symptoms of bleeding.     Ordered 5mg  Oxycodone  x10 to patient's pharmacy. Informed patient to present to ED if signs or symptoms of bleeding, including persistent or worsening pain despite pain medication, lightheadedness, dizziness, or fatigue present. He voiced understanding.     Buena Pouch, MD  [facility] Interventional Radiology  PGY-6

## 2024-01-12 NOTE — Unmapped (Signed)
 Completed VIR post call. Patient states is having bothersome pain at both the biopsy site and the right neck venous access site. Denies signs or symptoms of complication and states that pain is not enough to seek emergency help, but states that OTC medications have not been effective enough to manage pain to patient's satisfaction. Endorses no other issues that currently require evaluation or intervention. Reviewed and reinforced discharge instructions and provided instructions to contact VIR if necessary.

## 2024-01-14 DIAGNOSIS — Z794 Long term (current) use of insulin: Principal | ICD-10-CM

## 2024-01-14 DIAGNOSIS — E1165 Type 2 diabetes mellitus with hyperglycemia: Principal | ICD-10-CM

## 2024-01-14 MED ORDER — DEXCOM G7 SENSOR DEVICE
11 refills | 0.00000 days | Status: CP
Start: 2024-01-14 — End: ?

## 2024-01-14 MED ORDER — INSULIN DEGLUDEC (U-200) 200 UNIT/ML (3 ML) SUBCUTANEOUS PEN
Freq: Every day | SUBCUTANEOUS | 12 refills | 90.00000 days | Status: CP
Start: 2024-01-14 — End: ?
  Filled 2024-01-21: qty 27, 54d supply, fill #0

## 2024-01-14 MED ORDER — MOUNJARO 12.5 MG/0.5 ML SUBCUTANEOUS PEN INJECTOR
SUBCUTANEOUS | 0 refills | 0.00000 days | Status: CP
Start: 2024-01-14 — End: ?
  Filled 2024-01-21: qty 2, 28d supply, fill #0

## 2024-01-14 NOTE — Unmapped (Signed)
 [facility] at [facility]  Type 2 Diabetes New Visit    Referring Provider: Selina Pons  Primary Provider: Jama Mayo, MD    Assessment & Plan  Type 2 Diabetes Mellitus  Long-standing Type 2 Diabetes Mellitus, diagnosed around 2015, post-liver transplant.  Complications include HFpEF, CAD, cirrhosis of liver transplant, ESRD on HD, neuropathy.  He is poorly controlled with A1c 10% today.  Glycemic A1c goal at least less than 8%.  Ultimate goal to optimize candidacy for potential renal/liver transplant in the future.  Agree with complication directed therapy with GLP/GIP given his HFpEF, ESRD, and cirrhosis.  Likely limited benefit from SGLT2 given lack of urinary production/ESRD.  He demonstrates high insulin  requirement with severe obesity.  Weight loss would be paramount in this case.  He is not interested in gastric surgery and may not be an ideal surgical candidate.      Of note, he has titrated up his Mounjaro  dose fairly quickly.  Though he has not had any significant side effects with this, I do think it would be safer to slowly titrate up to build tolerance.    Plan:  - Switch long-acting insulin  from Levemir  to Tresiba , starting at 60 units nightly. Increase Tresiba  dose by 2 units every 3 days until fasting blood sugar is consistently below 150 mg/dL, up to a maximum of 100 units daily.  - Maintain Novolog  at 35 units before meals (usually only once per day).  Will defer formal sliding scale for the time being as we optimize his basal dose.  - Switch Mounjaro  to 12.5 mg weekly for one month, then increase to 15 mg weekly.  - Upgrade to Dexcom G7 for continuous glucose monitoring.  Sample placed today.    Patient is in need of a continuous glucose monitor and meets the following [place] [employer] guidelines:  *The patient has a diagnosis of insulin -dependent diabetes.  *The patient (or caregiver) is willing and able to use the therapeutic CGM system as prescribed.  AND  *The patient has had a face-to-face encounter with the treating practitioner to evaluate the patient's glycemic control.     Peripheral Neuropathy  Peripheral neuropathy with painful symptoms in the feet. Currently on gabapentin , but reports no significant relief from symptoms.    End-Stage Renal Disease on Dialysis  End-stage renal disease requiring dialysis three times a week at [facility] in [place]. On dialysis for almost a year.     Liver Transplant complicated by cirrhosis  Liver transplant in 2013. Recent decrease in liver function noted, with recent hospitalization for hepatic encephalopathy. Recent liver biopsy performed, awaiting further evaluation.  He appears euvolemic today on exam.    Diabetes Health Maintenance (otherwise not addressed above)    BP monitoring:   Blood Pressure for the past 24 hrs:   BP   01/14/24 0835 128/85   - Currently controlled.  He reports not taking his amlodipine  and carvedilol  currently.    Lipid monitoring:  Lab Results   Component Value Date    LDL 104 (H) 04/24/2022     - Statin indicated? yes Secondary prevention given history of CAD, MI or stroke  - statin choice: atorvastatin  40 mg high intensity, reportedly not taking, left address at next visit    Retinopathy monitoring:  - Ophthalmologist/optometrist: None currently, reports having retinopathy exam at PCPs office  - Last visit May 2025  - Retinopathy? no diabetic retinopathy    Peripheral Neuropathy monitoring:  - Last foot exam: Deferred today    Return  in about 4 weeks (around 02/11/2024).    Orders Placed This Encounter   Procedures    POCT Glucose    POCT glycosylated hemoglobin (Hb A1C)         Subjective     History of Present Illness:  Richard [name] is an 58 y.o. male seen at the request of Dr. Selina Pons in consultation for evaluation of diabetes.     History of Present Illness  He has been managing diabetes since approximately 2015, following his liver transplant. He has experienced episodes of extremely high blood sugar levels, including a past ICU admission for blood sugars around 800 mg/dL. Currently, he uses insulin  therapy, including Novolog  and Levemir , and is on Mounjaro  for diabetes management. His blood sugar levels typically range from 225 to 250 mg/dL, but can spike to 299 mg/dL without dietary intake. He uses a sliding scale for Novolog , taking 30 to 40 units depending on his blood sugar levels and meal intake. He takes 50 units of Levemir  at night and has recently increased his Mounjaro  dose to two 7.5 mg injections weekly.    He has a history of liver transplant in 2013 and has been experiencing decreased liver function recently, leading to hospitalization for encephalopathy. He is currently on lactulose , Xifaxan , and tacrolimus  for liver management. No recent ascites or swelling. He underwent a CT scan with contrast and a liver biopsy recently, with another biopsy scheduled.    He has been on dialysis for nearly a year, attending sessions at [facility] in [place] on Tuesdays, Thursdays, and Saturdays. He is doing well with dialysis and aims to manage his diabetes and weight to qualify for a kidney transplant list.    He experiences neuropathy in his feet, described as 'very painful sometimes,' and takes gabapentin , although it does not alleviate his pain. No recent infections, chest pain, or shortness of breath. No history of stroke and no retinopathy, confirmed by a recent eye exam.    His family history includes a grandfather who passed away from diabetes. He lives with his girlfriend and has been trying to lose weight independently, with some success. He has resumed walking daily for about 15 minutes.    Currently taking:   Levemir  50 units daily (for past 2 months)  Novolog  30-40 units before meals, sliding scale  Mounjaro , just went up to 7.5mg  x2 shots once a week two weeks ago    BG monitoring/patterns:   Dexcom G6 CGM        CGM downloaded and interpreted over at least 72 hours. Daily trends reviewed including patterns. Please see AGP attached to nurse triage note. Highlights include TIR 90%, TIR 9%, TBR 1%, GMI 10.9%, CV 29.4%. Patterns include global severe range hyperglycemia, with few intermittent into high target range following NovoLog  doses.    Diet:  While on Mounjaro , 1 meal per day, usually in afternoon  Not much snacking  Usually flavored drink (sweet tea) once per day    Exercise:   Just got back to walking daily, 15 mins    Diabetes History:  - Diagnosed type 2 after liver transplant ~2015  - Weight at diagnosis was obesity (BMI >40)  - Family history of DM Type 2 is present in grandfather  - C-peptide was not done  - Antibodies was not done  - Initial therapies included metformin (stopped due to kidneys)  - Started insulin  several years ago  - Complications:   - CAD: Yes, hx of NSTEMI, during an episode  of CHF exacerbaton   - CVA: No   - CHF: Yes (HFpEF), c/b a. fib   - CKD/albuminuria: Yes (ESRD on HD TTS x1 now d/t CNI toxicity and HTN, seen on biopsy), goes to [facility]   - Neuropathy: Yes, painful in feet, gabapentin  ineffective   - Retinopathy: Reportedly none (had it done with PCP office recently, June 2025)   - hx of [name] cirrhosis s/p transplant (2013), now with recurrence cirrhosis of transplanted liver c/b hepatic encephalopathy, recent admission June 2025, recent liver biopsy pending   - Hyperglycemic crises: Yes (hx of ICU admission for severe hyperglycemia several years ago, associated with sleepiness -- HHS?)   - Hypoglycemic crises: No  - Previously evaluated for bariatric surgery      Past Medical History[1]    Past Surgical History[2]    Current Medications[3]    Allergies[4]    Family History[5]    Social History     Social History Narrative    From [place]    He was in the [employer].    Now lives in [place]    He works in Engineer, petroleum        Married    1 child (age 91.5)       Review of Systems: 12 Systems reviewed and otherwise negative except as noted in HPI.      Objective     Vitals:    01/14/24 0835   BP: 128/85   Pulse: 50     Wt Readings from Last 3 Encounters:   01/14/24 (!) 122.8 kg (270 lb 12.8 oz)   01/11/24 (!) 117.9 kg (260 lb)   12/24/23 (!) 120.3 kg (265 lb 4.8 oz)     Physical Exam  Constitutional:       General: He is not in acute distress.     Appearance: He is obese.   HENT:      Head: Normocephalic.      Mouth/Throat:      Mouth: Mucous membranes are moist.   Eyes:      General: No scleral icterus.     Extraocular Movements: Extraocular movements intact.      Conjunctiva/sclera: Conjunctivae normal.   Neck:      Thyroid: No thyromegaly.   Cardiovascular:      Rate and Rhythm: Normal rate and regular rhythm.   Pulmonary:      Effort: Pulmonary effort is normal. No respiratory distress.      Breath sounds: Normal breath sounds.   Abdominal:      General: Abdomen is flat. There is no distension.      Palpations: Abdomen is soft.      Tenderness: There is no abdominal tenderness.      Comments: No obvious ascites   Musculoskeletal:      Right lower leg: No edema.      Left lower leg: No edema.   Lymphadenopathy:      Cervical: No cervical adenopathy.   Skin:     General: Skin is warm.      Capillary Refill: Capillary refill takes less than 2 seconds.   Neurological:      General: No focal deficit present.      Mental Status: He is alert and oriented to person, place, and time.      Motor: No tremor.   Psychiatric:         Mood and Affect: Mood normal.          Lab Review  Lab Results   Component Value Date    NA 137 01/11/2024    K 3.5 01/11/2024    CL 97 (L) 01/11/2024    CO2 27.0 01/11/2024    BUN 17 01/11/2024    CREATININE 6.64 (H) 01/11/2024    GFRNONAA 27 (L) 07/15/2021    GFRAA >=60 03/27/2017    CALCIUM  9.6 01/11/2024    ALBUMIN 3.5 01/11/2024    PHOS 3.1 01/11/2024       Lab Results   Component Value Date    A1C 10.0 (A) 01/14/2024    A1C 11.8 08/28/2023    A1C 10.5 03/01/2023    A1C 8.3 (H) 08/04/2022    A1C 10.5 (H) 04/24/2022    A1C 8.3 (H) 08/28/2021       Lab Results   Component Value Date    CHOL 211 (H) 04/24/2022     Lab Results   Component Value Date    LDL 104 (H) 04/24/2022     Lab Results   Component Value Date    HDL 33 (L) 04/24/2022     Lab Results   Component Value Date    TRIG 369 (H) 04/24/2022     Lab Results   Component Value Date    ALT 27 01/11/2024       Lab Results   Component Value Date    CREATUR 131.8 11/20/2022     Lab Results   Component Value Date    Albumin Quantitative, Urine 177.3 06/06/2022    Albumin/Creatinine Ratio 1,316.3 (H) 06/06/2022     Lab Results   Component Value Date    Microalbumin Quantitative, Urine 31.6 04/13/2014    Microalbumin/Creatinine Ratio 160.6 (H) 04/13/2014     Lab Results   Component Value Date    Protein, Ur 114.9 11/16/2022    Protein, Ur 52 04/13/2014    Protein/Creatinine Ratio, Urine 3.792 11/16/2022    Protein/Creatinine Ratio, Urine 0.264 04/13/2014       Lab Results   Component Value Date    VITAMINB12 721 11/09/2022       Lab Results   Component Value Date    VITDTOTAL 21.2 11/09/2022       Lab Results   Component Value Date    TSH 2.539 11/09/2022         Radiology:  Pertinent studies were reviewed. Relevant findings include:     EXAM: NM MYOCARDIAL PERFUSION SPECT MULTIPLE  06/01/2023    IMPRESSIONS:  __________________________________________________________________  - Low risk study  - No significant ischemia noted  - There is a moderate sized, mild in severity, fixed perfusion defect involving the mid anterior, apical anterior and apical segments. This is consistent with possible artifact (motion and misregistration of attenuation CT, attenuation) but, cannot rule out subtle scar.  - Left ventricular systolic function is normal. Post stress the ejection fraction is > 60%.  - Mild coronary calcifications are noted  - Incidentally noted on the attenuation CT scan are several small calcified nodules likely representing sequelae of previous granulomatous disease  - In the future, for larger patients or those with attenuation issues, consider ordering PET rubidium study     Echo 10/2022  Summary    1. The left ventricle is normal in size with moderately increased wall  thickness.    2. The left ventricular systolic function is normal, LVEF is visually  estimated at > 55%.    3. Technically difficult study.    4. The left atrium is mildly  to moderately dilated in size.    Other Medical Data:  Reviewed and summarized records in EPIC/Media and via CareEverywhere for purposes of today's visit. Any pertinent data is included in the note above.    This record has been created using Abridge AI-powered clinical conversation platform. Chart creation errors have been sought, but may not always have been located. Such creation errors do not reflect on the standard of medical care.    I personally spent greater than 60 minutes face-to-face and non-face-to-face in the care of this patient, which includes all pre, intra, and post visit time on the date of service.  All documented time was specific to the E/M visit and does not include any procedures that may have been performed.    Time was spent discussing prescription drug management, reviewing specialty notes, placing continuous glucose monitoring system and connecting to remote chair.       [1]   Past Medical History:  Diagnosis Date    CHF (congestive heart failure)        COVID-19 07/18/2019    Diabetes mellitus        Family history of malignant neoplasm of prostate 06/23/2015    Heart disease     Heart murmur     Hepatic cirrhosis    06/23/2015    Overview:  Secondary to NASH; followed by Dr. Lindaann, GI.  Last Assessment & Plan:  Relevant Hx: Course: Daily Update: Today's Plan:     Hypertension     Hypogonadism in male 02/12/2014    Kidney stone     Liver cirrhosis secondary to NASH (nonalcoholic steatohepatitis)        s/p liver transplant 2013    Obesity    [2]   Past Surgical History:  Procedure Laterality Date    CHOLECYSTECTOMY      liver transpant      LIVER TRANSPLANTATION  06/27/2011    PR AV FIST REVISE GRFT,W THROMBECTOMY Right 01/15/2023    Procedure: REVISION, ARTERIOVENOUS FISTULA W/ THROMBECTOMY, AUTOGENOUS OR NONAUTOGENOUS DIALYSIS GRAFT (SEP. PROC), LOWER EXTREMITY;  Surgeon: Marchelle Kitchens, MD;  [facility];  Service: General Surgery    PR COLONOSCOPY W/BIOPSY SINGLE/MULTIPLE N/A 08/13/2018    Procedure: COLONOSCOPY, FLEXIBLE, PROXIMAL TO SPLENIC FLEXURE; WITH BIOPSY, SINGLE OR MULTIPLE;  Surgeon: Thedora Alm Plain, MD;  [facility]  Service: Gastroenterology    PR COLSC FLX W/RMVL OF TUMOR POLYP LESION SNARE TQ N/A 08/13/2018    Procedure: COLONOSCOPY FLEX; W/REMOV TUMOR/LES BY SNARE;  Surgeon: Thedora Alm Plain, MD;  Location: [facility];  Service: Gastroenterology    PR CREAT AV FISTULA,NON-AUTOGENOUS GRAFT Right 11/29/2022    Procedure: CREATE AV FISTULA (SEPARATE PROC); NONAUTOGENOUS GRAFT (EG, BIOLOGICAL COLLAGEN, THERMOPLASTIC GRAFT), UPPER EXTREMITY;  Surgeon: Marchelle Kitchens, MD;  Location: [facility] OR [facility];  Service: General Surgery    PR UPPER GI ENDOSCOPY,BIOPSY N/A 07/13/2015    Procedure: UGI ENDOSCOPY; WITH BIOPSY, SINGLE OR MULTIPLE;  Surgeon: Elspeth Jerilynn Reek, MD;  Location: GI [facility];  Service: Gastroenterology    PR UPPER GI ENDOSCOPY,BIOPSY N/A 02/13/2023    Procedure: UGI ENDOSCOPY; WITH BIOPSY, SINGLE OR MULTIPLE;  Surgeon: Minnie Krystal Claude, MD;  [facility]: [facility];  Service: Gastroenterology   [3]   Current Outpatient Medications:     amlodipine  (NORVASC ) 10 MG tablet, Take 1 tablet (10 mg total) by mouth daily. (Patient not taking: Reported on 01/11/2024), Disp: 90 tablet, Rfl: 3  apixaban  (ELIQUIS ) 5 mg Tab, Take 1 tablet (5 mg total) by mouth two (2) times a day., Disp: 180 tablet, Rfl: 3    atorvastatin  (LIPITOR ) 40 MG tablet, Take 1 tablet (40 mg total) by mouth daily. (Patient not taking: Reported on 01/11/2024), Disp: 100 tablet, Rfl: 3    blood-glucose meter,continuous (DEXCOM G6 RECEIVER) Misc, 1 each by Miscellaneous route in the morning. Dispense 1 receiver annually.  Sent to [facility]., Disp: 1 each, Rfl: 1    blood-glucose sensor (DEXCOM G7 SENSOR) Devi, Use 1 sensor every 10 days., Disp: 3 each, Rfl: 11    blood-glucose transmitter (DEXCOM G6 TRANSMITTER) Devi, 1 each by Miscellaneous route Every three (3) months. [facility] pharmacy, Disp: , Rfl:     blood-glucose transmitter (DEXCOM G6 TRANSMITTER) Devi, Use as directed to monitor blood glucose, Disp: 1 each, Rfl: 3    carvedilol  (COREG ) 12.5 MG tablet, Take 1 tablet (12.5 mg total) by mouth two (2) times a day. Patient unsure about dosage (Patient not taking: Reported on 01/14/2024), Disp: , Rfl:     CHOLECALCIFEROL, VITAMIN D3, (VITAMIN D3 ORAL), Take 2,000 Units by mouth daily., Disp: , Rfl:     gabapentin  (NEURONTIN ) 300 MG capsule, Take 1 capsule (300 mg total) by mouth nightly., Disp: 30 capsule, Rfl: 12    insulin  aspart (NOVOLOG  FLEXPEN U-100 INSULIN ) 100 unit/mL (3 mL) injection pen, Inject 0.35 mL (35 Units total) under the skin Three (3) times a day before meals., Disp: 60 mL, Rfl: 0    insulin  degludec (TRESIBA  FLEXTOUCH U-200) 200 unit/mL (3 mL) InPn, Inject 0.3 mL (60 Units total) under the skin daily. Max dose of 100 units per day., Disp: 27 mL, Rfl: 12    lactulose  10 gram/15 mL solution, Take 45 mL (30 g total) by mouth two (2) times a day., Disp: , Rfl:     lanthanum  (FOSRENOL ) 1000 MG chewable tablet, Chew 1 tablet (1,000 mg total) Three (3) times a day with a meal., Disp: 90 tablet, Rfl: 11    magnesium  oxide (MAG-OX) 400 mg (241.3 mg elemental magnesium ) tablet, Take 1 tablet (400 mg total) by mouth daily., Disp: , Rfl:     multivitamin (TAB-A-VITE/THERAGRAN) per tablet, Take 1 tablet by mouth daily., Disp: , Rfl:     mycophenolate  (CELLCEPT ) 250 mg capsule, Take 1 capsule (250 mg total) by mouth two (2) times a day., Disp: 60 capsule, Rfl: 11    oxyCODONE  (ROXICODONE ) 5 MG immediate release tablet, Take 1 tablet (5 mg total) by mouth every four (4) hours as needed for pain. (Patient not taking: Reported on 01/14/2024), Disp: 10 tablet, Rfl: 0    pen needle, diabetic (TRUEPLUS PEN NEEDLE) 32 gauge x 5/32 (4 mm) Ndle, Use with insulin  up to 4 times a day as needed., Disp: 100 each, Rfl: 0    pen needle, diabetic 32 gauge x 5/32 (4 mm) Ndle, Use with insulin  up to 4 times/day as needed., Disp: 100 each, Rfl: 0    polyethylene glycol (GOLYTELY ) 236-22.74-6.74 gram solution, Take by mouth as directed per [facility] GI prep instructions, for split bowel prep., Disp: 4000 mL, Rfl: 0    rifAXIMin  (XIFAXAN ) 550 mg Tab, Take 1 tablet (550 mg total) by mouth two (2) times a day., Disp: 180 tablet, Rfl: 3    sildenafil (VIAGRA) 50 MG tablet, Take 1 tablet (50 mg total) by mouth daily as needed for erectile dysfunction. (Patient not taking: Reported on 10/01/2023), Disp: , Rfl:  tacrolimus  (PROGRAF ) 0.5 MG capsule, Take 2 capsules (1 mg total) by mouth two (2) times a day., Disp: 360 capsule, Rfl: 3    tirzepatide  (MOUNJARO ) 12.5 mg/0.5 mL PnIj, Inject 12.5 mg under the skin every seven (7) days., Disp: 2 mL, Rfl: 0    [START ON 02/11/2024] tirzepatide  (MOUNJARO ) 15 mg/0.5 mL PnIj, Inject 15 mg under the skin every seven (7) days., Disp: 2 mL, Rfl: 3  [4] No Known Allergies  [5]   Family History  Problem Relation Age of Onset    Diabetes Father     Diabetes Paternal Grandfather     Liver disease Maternal Uncle     Cancer Maternal Grandmother     Melanoma Neg Hx     Basal cell carcinoma Neg Hx     Squamous cell carcinoma Neg Hx     Kidney disease Neg Hx

## 2024-01-15 ENCOUNTER — Encounter
Admit: 2024-01-15 | Discharge: 2024-01-21 | Disposition: A | Discharge status: Home / Self Care | Payer: BLUE CROSS/BLUE SHIELD

## 2024-01-15 ENCOUNTER — Ambulatory Visit: Admit: 2024-01-15 | Discharge: 2024-01-21 | Payer: BLUE CROSS/BLUE SHIELD

## 2024-01-15 ENCOUNTER — Ambulatory Visit
Admit: 2024-01-15 | Discharge: 2024-01-21 | Disposition: A | Discharge status: Home / Self Care | Payer: BLUE CROSS/BLUE SHIELD

## 2024-01-15 ENCOUNTER — Inpatient Hospital Stay
Admit: 2024-01-15 | Discharge: 2024-01-21 | Disposition: A | Discharge status: Home / Self Care | Payer: BLUE CROSS/BLUE SHIELD

## 2024-01-15 LAB — CBC W/ AUTO DIFF
BASOPHILS ABSOLUTE COUNT: 0 10*9/L (ref 0.0–0.1)
BASOPHILS RELATIVE PERCENT: 0.7 %
EOSINOPHILS ABSOLUTE COUNT: 0.2 10*9/L (ref 0.0–0.5)
EOSINOPHILS RELATIVE PERCENT: 2.9 %
HEMATOCRIT: 39.8 % (ref 39.0–48.0)
HEMOGLOBIN: 13.2 g/dL (ref 12.9–16.5)
LYMPHOCYTES ABSOLUTE COUNT: 0.5 10*9/L — ABNORMAL LOW (ref 1.1–3.6)
LYMPHOCYTES RELATIVE PERCENT: 9.5 %
MEAN CORPUSCULAR HEMOGLOBIN CONC: 33.2 g/dL (ref 32.0–36.0)
MEAN CORPUSCULAR HEMOGLOBIN: 29.2 pg (ref 25.9–32.4)
MEAN CORPUSCULAR VOLUME: 87.8 fL (ref 77.6–95.7)
MEAN PLATELET VOLUME: 8.9 fL (ref 6.8–10.7)
MONOCYTES ABSOLUTE COUNT: 0.3 10*9/L (ref 0.3–0.8)
MONOCYTES RELATIVE PERCENT: 6 %
NEUTROPHILS ABSOLUTE COUNT: 4.5 10*9/L (ref 1.8–7.8)
NEUTROPHILS RELATIVE PERCENT: 80.9 %
PLATELET COUNT: 201 10*9/L (ref 150–450)
RED BLOOD CELL COUNT: 4.53 10*12/L (ref 4.26–5.60)
RED CELL DISTRIBUTION WIDTH: 13.9 % (ref 12.2–15.2)
WBC ADJUSTED: 5.5 10*9/L (ref 3.6–11.2)

## 2024-01-15 LAB — APTT
APTT: 28.3 s (ref 24.8–38.4)
HEPARIN CORRELATION: 0.2

## 2024-01-15 LAB — COMPREHENSIVE METABOLIC PANEL
ALBUMIN: 3.3 g/dL — ABNORMAL LOW (ref 3.4–5.0)
ALKALINE PHOSPHATASE: 391 U/L — ABNORMAL HIGH (ref 46–116)
ALT (SGPT): 105 U/L — ABNORMAL HIGH (ref 10–49)
ANION GAP: 13 mmol/L (ref 5–14)
AST (SGOT): 128 U/L — ABNORMAL HIGH (ref <=34)
BILIRUBIN TOTAL: 0.9 mg/dL (ref 0.3–1.2)
BLOOD UREA NITROGEN: 36 mg/dL — ABNORMAL HIGH (ref 9–23)
BUN / CREAT RATIO: 3
CALCIUM: 9.9 mg/dL (ref 8.7–10.4)
CHLORIDE: 100 mmol/L (ref 98–107)
CO2: 23 mmol/L (ref 20.0–31.0)
CREATININE: 11.51 mg/dL — ABNORMAL HIGH (ref 0.73–1.18)
EGFR CKD-EPI (2021) MALE: 5 mL/min/1.73m2 — ABNORMAL LOW (ref >=60)
GLUCOSE RANDOM: 303 mg/dL — ABNORMAL HIGH (ref 70–179)
POTASSIUM: 4.8 mmol/L (ref 3.4–4.8)
PROTEIN TOTAL: 6.8 g/dL (ref 5.7–8.2)
SODIUM: 136 mmol/L (ref 135–145)

## 2024-01-15 LAB — BLOOD GAS, VENOUS
BASE EXCESS VENOUS: -0.1 (ref -2.0–2.0)
HCO3 VENOUS: 24 mmol/L (ref 22–27)
O2 SATURATION VENOUS: 72.1 % (ref 40.0–85.0)
PCO2 VENOUS: 40 mmHg (ref 40–60)
PH VENOUS: 7.4 (ref 7.32–7.43)
PO2 VENOUS: 39 mmHg (ref 30–55)

## 2024-01-15 LAB — PROTIME-INR
INR: 0.98
PROTIME: 11.2 s (ref 9.9–12.6)

## 2024-01-15 LAB — PRO-BNP: PRO-BNP: 14682 pg/mL — ABNORMAL HIGH (ref <=300.0)

## 2024-01-15 LAB — MAGNESIUM: MAGNESIUM: 2.3 mg/dL (ref 1.6–2.6)

## 2024-01-15 LAB — AMMONIA: AMMONIA: 150 umol/L — ABNORMAL HIGH (ref 11–32)

## 2024-01-15 LAB — TSH: THYROID STIMULATING HORMONE: 1.401 u[IU]/mL (ref 0.550–4.780)

## 2024-01-15 LAB — LIPASE: LIPASE: 40 U/L (ref 12–53)

## 2024-01-15 MED ADMIN — lactulose (CEPHULAC) packet 20 g: 20 g | ORAL | @ 21:00:00 | Stop: 2024-01-15

## 2024-01-15 NOTE — Unmapped (Signed)
 Here for possible elevated ammonia level, states he's confused. Hx cirrhosis.

## 2024-01-15 NOTE — Unmapped (Signed)
.  Patient rounding complete, call bell in reach, bed locked and in lowest position, patient belongings at bedside and within reach of patient.  Patient updated on plan of care.      Pt sleeping quite at this time.

## 2024-01-15 NOTE — Unmapped (Signed)
 [facility]  Emergency Department Provider Note      ED Clinical Impression      Hepatic encephalopathy      Impression, ED Course, Assessment and Plan      Impression: 58 y.o. male with pMHx of cirrhosis, HTN, T2DM, morbid obesity, OSA, and ESRD presenting with confusion.  History is somewhat challenging from his confusion.  He reported initially to the medical student that he was driving from dialysis and forgot how to get home.  On my evaluation, he reports that he noticed he was confused and drove himself to his girlfriend's house but is also unable to indicate how he presented to the emergency department.  He knows the year is 2025 but does not know what day it is.  He does note that he is at [facility].  He is unable to state how he felt this morning or yesterday or provide any significant history leading up to the presentation.    Initial differential diagnosis was broad including infection, hepatic encephalopathy, and electrolyte disturbance.  He has nonfocal neurologic examination and thus acute stroke is unlikely and he has no evidence of trauma to suggest intracranial hemorrhage and head CT is not indicated.  Signs are within normal limits making sepsis unlikely.  Labs are without any leukocytosis to suggest infection and his blood gas is within normal limits.  His glucose is elevated, however there is no evidence of DKA.  Chest x-ray shows no evidence of pneumonia.  Labs are consistent with his baseline and his history of end-stage renal disease.  His ammonia is significantly elevated to 150 explains his mental status.  He has been given lactulose  and given his ongoing confusion, plan is admission for further workup and management.  He has no abdominal tenderness, rebound, or guarding to suggest SBP and again has no acute evidence of infection.    20:30  MAO has been paged for admission.    23:00   Patient care is signed out to Dr. Gerrie pending admission.     Additional Medical Decision Making      I have reviewed the vital signs and the nursing notes. Labs and radiology results that were available during my care of the patient were independently reviewed by me and considered in my medical decision making.            Independent interpretation: EKG(s) - Atrial flutter with variable AV conduction and nonspecific intraventricular conduction delay.  No ST elevation, ST depression, or T wave inversion.  X-ray(s) - No focal consolidation, cardiomegaly noted.  I have reviewed recent and relavant previous record, including: Outpatient notes - endocrinology note from yesterday        Diagnostic tests considered but not performed: CT head      History      Chief Complaint  Confusion     History of Present Illness   Richard [name] is a 58 y.o. male with pMHx of cirrhosis, HTN, T2DM, morbid obesity, OSA, and ESRD presenting with confusion.     On medical student exam, he states that he was driving to dialysis session and realized that he was unaware of how to get home.  On my exam, he states that he realized he was confused and drove to his girlfriend's house and was then brought here but then also states that he does not know how high he is.  He denies drug use but simply states that he does not know when it is.  When  asked how he felt this morning or yesterday, he states he does not know when it is.    Per chart review, he was seen yesterday at endocrinology with an adjustment in his insulin  dosage but otherwise no significant medication changes.  He denies any abdominal pain, fevers, chills, or any other acute complaints.    [Past Medical History]       Diagnosis Date    CHF (congestive heart failure)         COVID-19 07/18/2019    Diabetes mellitus         Family history of malignant neoplasm of prostate 06/23/2015    Heart disease      Heart murmur      Hepatic cirrhosis    06/23/2015     Overview:  Secondary to NASH; followed by Dr. Lindaann, GI.  Last Assessment & Plan:  Relevant Hx: Course: Daily Update: Today's Plan:     Hypertension      Hypogonadism in male 02/12/2014    Kidney stone      Liver cirrhosis secondary to NASH (nonalcoholic steatohepatitis)          s/p liver transplant 2013    Obesity          Patient Active Problem List      Diagnosis    Hypertension    Acanthosis nigricans    Type 2 diabetes mellitus with hyperglycemia, with long-term current use of insulin        H/O gastroesophageal reflux (GERD)    Liver replaced by transplant       Class 3 obesity    Obstructive sleep apnea syndrome    Proteinuria due to type 2 diabetes mellitus       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)    ESRD (end stage renal disease) on dialysis       Type 2 MI (myocardial infarction)       (HFpEF) heart failure with preserved ejection fraction       Severe hyperglycemia due to diabetes mellitus       Cellulitis of right lower extremity    Acute kidney injury superimposed on chronic kidney disease    CAD (coronary artery disease)    Elevated liver enzymes    Hyperphosphatemia    Acute post-operative pain    Cirrhosis of transplanted liver           [Past Surgical History]        Procedure Laterality Date    CHOLECYSTECTOMY        liver transpant        LIVER TRANSPLANTATION   06/27/2011    PR AV FIST REVISE GRFT,W THROMBECTOMY Right 01/15/2023     Procedure: REVISION, ARTERIOVENOUS FISTULA W/ THROMBECTOMY, AUTOGENOUS OR NONAUTOGENOUS DIALYSIS GRAFT (SEP. PROC), LOWER EXTREMITY;  Surgeon: Marchelle Kitchens, MD;  Location: [facility];  Service: General Surgery    PR COLONOSCOPY W/BIOPSY SINGLE/MULTIPLE N/A 08/13/2018     Procedure: COLONOSCOPY, FLEXIBLE, PROXIMAL TO SPLENIC FLEXURE; WITH BIOPSY, SINGLE OR MULTIPLE;  Surgeon: Thedora Alm Plain, MD;  [facility]: [facility];  Service: Gastroenterology    PR COLSC FLX W/RMVL OF TUMOR POLYP LESION SNARE TQ N/A 08/13/2018     Procedure: COLONOSCOPY FLEX; W/REMOV TUMOR/LES BY SNARE;  Surgeon: Thedora Alm Plain, MD;  Location: [facility];  Service: Gastroenterology    PR CREAT AV FISTULA,NON-AUTOGENOUS GRAFT Right 11/29/2022     Procedure: CREATE AV FISTULA (SEPARATE PROC); NONAUTOGENOUS GRAFT (EG, BIOLOGICAL COLLAGEN,  THERMOPLASTIC GRAFT), UPPER EXTREMITY;  Surgeon: Marchelle Kitchens, MD;  Location: [facility] OR [facility];  Service: General Surgery    PR UPPER GI ENDOSCOPY,BIOPSY N/A 07/13/2015     Procedure: UGI ENDOSCOPY; WITH BIOPSY, SINGLE OR MULTIPLE;  Surgeon: Elspeth Jerilynn Reek, MD;  Location: [facility];  Service: Gastroenterology    PR UPPER GI ENDOSCOPY,BIOPSY N/A 02/13/2023     Procedure: UGI ENDOSCOPY; WITH BIOPSY, SINGLE OR MULTIPLE;  Surgeon: Minnie Krystal Claude, MD;  [facility]: [facility];  Service: Gastroenterology        No current facility-administered medications for this encounter.     Current Outpatient Medications:     amlodipine  (NORVASC ) 10 MG tablet, Take 1 tablet (10 mg total) by mouth daily. (Patient not taking: Reported on 01/11/2024), Disp: 90 tablet, Rfl: 3    apixaban  (ELIQUIS ) 5 mg Tab, Take 1 tablet (5 mg total) by mouth two (2) times a day., Disp: 180 tablet, Rfl: 3    atorvastatin  (LIPITOR ) 40 MG tablet, Take 1 tablet (40 mg total) by mouth daily. (Patient not taking: Reported on 01/11/2024), Disp: 100 tablet, Rfl: 3    blood-glucose meter,continuous (DEXCOM G6 RECEIVER) Misc, 1 each by Miscellaneous route in the morning. Dispense 1 receiver annually.  Sent to [facility]., Disp: 1 each, Rfl: 1    blood-glucose sensor (DEXCOM G7 SENSOR) Devi, Use 1 sensor every 10 days., Disp: 3 each, Rfl: 11    blood-glucose transmitter (DEXCOM G6 TRANSMITTER) Devi, 1 each by Miscellaneous route Every three (3) months. [facility] pharmacy, Disp: , Rfl:     blood-glucose transmitter (DEXCOM G6 TRANSMITTER) Devi, Use as directed to monitor blood glucose, Disp: 1 each, Rfl: 3    carvedilol  (COREG ) 12.5 MG tablet, Take 1 tablet (12.5 mg total) by mouth two (2) times a day. Patient unsure about dosage (Patient not taking: Reported on 01/14/2024), Disp: , Rfl:     CHOLECALCIFEROL, VITAMIN D3, (VITAMIN D3 ORAL), Take 2,000 Units by mouth daily., Disp: , Rfl:     gabapentin  (NEURONTIN ) 300 MG capsule, Take 1 capsule (300 mg total) by mouth nightly., Disp: 30 capsule, Rfl: 12    insulin  aspart (NOVOLOG  FLEXPEN U-100 INSULIN ) 100 unit/mL (3 mL) injection pen, Inject 0.35 mL (35 Units total) under the skin Three (3) times a day before meals., Disp: 60 mL, Rfl: 0    insulin  degludec (TRESIBA  FLEXTOUCH U-200) 200 unit/mL (3 mL) InPn, Inject 0.3 mL (60 Units total) under the skin daily. Max dose of 100 units per day., Disp: 27 mL, Rfl: 12    lactulose  10 gram/15 mL solution, Take 45 mL (30 g total) by mouth two (2) times a day., Disp: , Rfl:     lanthanum  (FOSRENOL ) 1000 MG chewable tablet, Chew 1 tablet (1,000 mg total) Three (3) times a day with a meal., Disp: 90 tablet, Rfl: 11    magnesium  oxide (MAG-OX) 400 mg (241.3 mg elemental magnesium ) tablet, Take 1 tablet (400 mg total) by mouth daily., Disp: , Rfl:     multivitamin (TAB-A-VITE/THERAGRAN) per tablet, Take 1 tablet by mouth daily., Disp: , Rfl:     mycophenolate  (CELLCEPT ) 250 mg capsule, Take 1 capsule (250 mg total) by mouth two (2) times a day., Disp: 60 capsule, Rfl: 11    oxyCODONE  (ROXICODONE ) 5 MG immediate release tablet, Take 1 tablet (5 mg total) by mouth every four (4) hours as needed for pain. (Patient not taking: Reported on 01/14/2024), Disp: 10 tablet, Rfl: 0    pen  needle, diabetic (TRUEPLUS PEN NEEDLE) 32 gauge x 5/32 (4 mm) Ndle, Use with insulin  up to 4 times a day as needed., Disp: 100 each, Rfl: 0    pen needle, diabetic 32 gauge x 5/32 (4 mm) Ndle, Use with insulin  up to 4 times/day as needed., Disp: 100 each, Rfl: 0    polyethylene glycol (GOLYTELY ) 236-22.74-6.74 gram solution, Take by mouth as directed per [facility] GI prep instructions, for split bowel prep., Disp: 4000 mL, Rfl: 0    rifAXIMin  (XIFAXAN ) 550 mg Tab, Take 1 tablet (550 mg total) by mouth two (2) times a day., Disp: 180 tablet, Rfl: 3    sildenafil (VIAGRA) 50 MG tablet, Take 1 tablet (50 mg total) by mouth daily as needed for erectile dysfunction. (Patient not taking: Reported on 10/01/2023), Disp: , Rfl:     tacrolimus  (PROGRAF ) 0.5 MG capsule, Take 2 capsules (1 mg total) by mouth two (2) times a day., Disp: 360 capsule, Rfl: 3    tirzepatide  (MOUNJARO ) 12.5 mg/0.5 mL PnIj, Inject 12.5 mg under the skin every seven (7) days., Disp: 2 mL, Rfl: 0    [START ON 02/11/2024] tirzepatide  (MOUNJARO ) 15 mg/0.5 mL PnIj, Inject 15 mg under the skin every seven (7) days., Disp: 2 mL, Rfl: 3     Allergies  Patient has no known allergies.     [Family History]        Problem Relation Age of Onset    Diabetes Father      Diabetes Paternal Grandfather      Liver disease Maternal Uncle      Cancer Maternal Grandmother      Melanoma Neg Hx      Basal cell carcinoma Neg Hx      Squamous cell carcinoma Neg Hx      Kidney disease Neg Hx          Social History    Social History        Tobacco Use    Smoking status: Former       Passive exposure: Past    Smokeless tobacco: Never   Vaping Use    Vaping status: Never Used   Substance Use Topics    Alcohol use: No    Drug use: No           Physical Exam           ED Triage Vitals   Vital Signs Group      Temp 01/15/24 1336 36.6 ??C (97.8 ??F)      Temp Source 01/15/24 1533 Oral      Pulse 01/15/24 1319 88      SpO2 Pulse 01/15/24 1415 74      Heart Rate Source 01/15/24 1533 Monitor      Resp 01/15/24 1336 17      BP 01/15/24 1336 144/116      Girls Systolic BP Percentile --        Girls Diastolic BP Percentile --        Boys Systolic BP Percentile --        Boys Diastolic BP Percentile --        MAP (mmHg) 01/15/24 1336 126      BP Location 01/15/24 1533 (S) Left leg      BP Method 01/15/24 1533 Automatic      Patient Position 01/15/24 1533 Sitting   SpO2 01/15/24 1319 99 %   O2 Flow Rate (L/min) --     O2 Device 01/15/24 1533  None (Room air) Constitutional: Slightly drowsy but easily arousable.  Eyes: Conjunctivae are normal.  ENT       Head: Normocephalic and atraumatic.       Nose: No congestion.       Mouth/Throat: Mucous membranes are moist.       Neck: No stridor.  Cardiovascular: Normal rate, regular rhythm. Normal and symmetric distal pulses are present in all extremities.  Respiratory: Normal respiratory effort. Breath sounds are normal.  Gastrointestinal: Soft and nontender.  No rebound or guarding.  Musculoskeletal: Normal range of motion in all extremities.       Right lower leg: No tenderness or edema.       Left lower leg: No tenderness or edema.  Neurologic: Speech is not slurred.  Patient is alert, oriented to person and place but not to time.  5 out of 5 strength in bilateral upper and lower extremities.  Normal sensation bilateral lower extremities.  Face is symmetric.  Asterixis appreciated bilaterally.  Skin: Skin is warm, dry and intact. No rash noted.  Psychiatric: Mood and affect are normal. Speech and behavior are normal.      EKG      Atrial flutter with variable AV conduction and nonspecific intraventricular conduction delay.  No ST elevation, ST depression, or T wave inversion.      Radiology      XR Chest 2 views   Final Result      Cardiomegaly. Mildly enlarged right hemidiaphragm. No focal pulmonary consolidation.                       Claudene Morene Dawn, MD  01/17/24 [phone]

## 2024-01-15 NOTE — Unmapped (Signed)
 Tacrolimus  Therapeutic Monitoring Pharmacy Note    Richard [name] is a 58 y.o. male continuing tacrolimus .     Indication: Liver transplant     Date of Transplant: 02/2012      Prior Dosing Information: Home regimen 1 mg BID     Source(s) of information used to determine prior to admission dosing: Clinic Note    Goals:  Therapeutic Drug Levels  Tacrolimus  trough goal: 2-4 ng/ml    Additional Clinical Monitoring/Outcomes  Monitor renal function (SCr and urine output) and liver function (LFTs)  Monitor for signs/symptoms of adverse events (e.g., hyperglycemia, hyperkalemia, hypomagnesemia, hypertension, headache, tremor)    Previous Lab Values  Tacrolimus , Trough   Date/Time Value Ref Range Status   12/04/2022 06:32 AM 5.2 5.0 - 15.0 ng/mL Final   12/01/2022 07:02 AM 3.0 (L) 5.0 - 15.0 ng/mL Final   11/21/2022 07:31 AM 3.7 (L) 5.0 - 15.0 ng/mL Final   11/17/2022 04:45 AM 2.2 (L) 5.0 - 15.0 ng/mL Final   11/09/2022 10:05 AM <1.0 (L) 5.0 - 15.0 ng/mL Final   10/06/2014 09:00 AM 6.8 <=20.0 ng/mL Final   08/31/2014 08:34 AM 4.2  Final   08/03/2014 08:22 AM 4.7  Final   07/07/2014 08:37 AM 4.2  Final   06/09/2014 08:27 AM 3.5  Final     Tacrolimus , Timed   Date/Time Value Ref Range Status   01/11/2024 09:43 AM 8.9 ng/mL Final   12/24/2023 10:23 AM 18.1 ng/mL Final   12/04/2023 12:54 PM 3.6 ng/mL Final   11/29/2022 05:36 AM 5.0 ng/mL Final   11/27/2022 06:11 AM 4.4 ng/mL Final       Result:  Tacrolimus  level from most recent outpatient draw was drawn appropriately     Pharmacokinetic Considerations and Significant Drug Interactions:  Concurrent CYP3A4 substrates/inhibitors: None identified    Assessment/Plan:  Recommendedation(s)  Continue current regimen of 1 mg BID    Follow-up  Next level to be determined by primary team.   A pharmacist will continue to monitor and recommend levels as appropriate    Please page service pharmacist with questions/clarifications.    Richard [name] K Richard [name], PharmD

## 2024-01-15 NOTE — Unmapped (Signed)
.  Patient rounding complete, call bell in reach, bed locked and in lowest position, patient belongings at bedside and within reach of patient.  Patient updated on plan of care.      Pt paced on cardiac monitor, continuous pulse oximeter and continuous blood pressure monitoring.     ED MD at the bedside with pt.

## 2024-01-16 ENCOUNTER — Inpatient Hospital Stay: Admit: 2024-01-16 | Discharge: 2024-01-16 | Payer: BLUE CROSS/BLUE SHIELD

## 2024-01-16 LAB — COMPREHENSIVE METABOLIC PANEL
ALBUMIN: 3.1 g/dL — ABNORMAL LOW (ref 3.4–5.0)
ALKALINE PHOSPHATASE: 500 U/L — ABNORMAL HIGH (ref 46–116)
ALT (SGPT): 142 U/L — ABNORMAL HIGH (ref 10–49)
ANION GAP: 12 mmol/L (ref 5–14)
AST (SGOT): 115 U/L — ABNORMAL HIGH (ref <=34)
BILIRUBIN TOTAL: 1.4 mg/dL — ABNORMAL HIGH (ref 0.3–1.2)
BLOOD UREA NITROGEN: 43 mg/dL — ABNORMAL HIGH (ref 9–23)
BUN / CREAT RATIO: 3
CALCIUM: 9 mg/dL (ref 8.7–10.4)
CHLORIDE: 102 mmol/L (ref 98–107)
CO2: 21 mmol/L (ref 20.0–31.0)
CREATININE: 13.17 mg/dL — ABNORMAL HIGH (ref 0.73–1.18)
EGFR CKD-EPI (2021) MALE: 4 mL/min/1.73m2 — ABNORMAL LOW (ref >=60)
GLUCOSE RANDOM: 218 mg/dL — ABNORMAL HIGH (ref 70–179)
POTASSIUM: 4.2 mmol/L (ref 3.4–4.8)
PROTEIN TOTAL: 6.4 g/dL (ref 5.7–8.2)
SODIUM: 135 mmol/L (ref 135–145)

## 2024-01-16 LAB — CBC W/ AUTO DIFF
BASOPHILS ABSOLUTE COUNT: 0 10*9/L (ref 0.0–0.1)
BASOPHILS RELATIVE PERCENT: 0.5 %
EOSINOPHILS ABSOLUTE COUNT: 0.3 10*9/L (ref 0.0–0.5)
EOSINOPHILS RELATIVE PERCENT: 5.3 %
HEMATOCRIT: 38.2 % — ABNORMAL LOW (ref 39.0–48.0)
HEMOGLOBIN: 12.5 g/dL — ABNORMAL LOW (ref 12.9–16.5)
LYMPHOCYTES ABSOLUTE COUNT: 0.6 10*9/L — ABNORMAL LOW (ref 1.1–3.6)
LYMPHOCYTES RELATIVE PERCENT: 11.3 %
MEAN CORPUSCULAR HEMOGLOBIN CONC: 32.7 g/dL (ref 32.0–36.0)
MEAN CORPUSCULAR HEMOGLOBIN: 28.9 pg (ref 25.9–32.4)
MEAN CORPUSCULAR VOLUME: 88.4 fL (ref 77.6–95.7)
MEAN PLATELET VOLUME: 8.9 fL (ref 6.8–10.7)
MONOCYTES ABSOLUTE COUNT: 0.5 10*9/L (ref 0.3–0.8)
MONOCYTES RELATIVE PERCENT: 8.3 %
NEUTROPHILS ABSOLUTE COUNT: 4.1 10*9/L (ref 1.8–7.8)
NEUTROPHILS RELATIVE PERCENT: 74.6 %
PLATELET COUNT: 197 10*9/L (ref 150–450)
RED BLOOD CELL COUNT: 4.32 10*12/L (ref 4.26–5.60)
RED CELL DISTRIBUTION WIDTH: 13.8 % (ref 12.2–15.2)
WBC ADJUSTED: 5.5 10*9/L (ref 3.6–11.2)

## 2024-01-16 LAB — TACROLIMUS LEVEL, TROUGH: TACROLIMUS, TROUGH: 5.2 ng/mL (ref 5.0–15.0)

## 2024-01-16 LAB — PHOSPHORUS: PHOSPHORUS: 3.7 mg/dL (ref 2.4–5.1)

## 2024-01-16 LAB — MAGNESIUM: MAGNESIUM: 2.4 mg/dL (ref 1.6–2.6)

## 2024-01-16 MED ADMIN — insulin lispro (HumaLOG) injection CORRECTIONAL 0-20 Units: 0-20 [IU] | SUBCUTANEOUS | @ 17:00:00

## 2024-01-16 MED ADMIN — tacrolimus (PROGRAF) capsule 1 mg: 1 mg | ORAL | @ 02:00:00

## 2024-01-16 MED ADMIN — insulin lispro (HumaLOG) injection CORRECTIONAL 0-20 Units: 0-20 [IU] | SUBCUTANEOUS | @ 21:00:00

## 2024-01-16 MED ADMIN — rifAXIMin (XIFAXAN) tablet 550 mg: 550 mg | ORAL | @ 14:00:00 | Stop: 2024-01-30

## 2024-01-16 MED ADMIN — lactulose (CEPHULAC) packet 20 g: 20 g | ORAL | @ 05:00:00

## 2024-01-16 MED ADMIN — tacrolimus (PROGRAF) capsule 1 mg: 1 mg | ORAL | @ 14:00:00

## 2024-01-16 MED ADMIN — lactulose (CEPHULAC) packet 20 g: 20 g | ORAL | @ 14:00:00

## 2024-01-16 MED ADMIN — lactulose (CEPHULAC) packet 20 g: 20 g | ORAL | @ 19:00:00

## 2024-01-16 MED ADMIN — apixaban (ELIQUIS) tablet 5 mg: 5 mg | ORAL | @ 14:00:00

## 2024-01-16 MED ADMIN — acetaminophen (TYLENOL) tablet 650 mg: 650 mg | ORAL | @ 22:00:00 | Stop: 2024-01-16

## 2024-01-16 MED ADMIN — mycophenolate (CELLCEPT) capsule 250 mg: 250 mg | ORAL | @ 02:00:00

## 2024-01-16 MED ADMIN — mycophenolate (CELLCEPT) capsule 250 mg: 250 mg | ORAL | @ 14:00:00

## 2024-01-16 MED ADMIN — apixaban (ELIQUIS) tablet 5 mg: 5 mg | ORAL | @ 02:00:00

## 2024-01-16 NOTE — Unmapped (Signed)
 Note written for communication purposes and to complete consult order for the Medicine Procedure Service. POCUS performed on 01/16/2024 and no ascites seen for paracentesis. Please see procedure note for details.    Thank you for allowing us  to participate in this patient's care. Will sign off. If you need further assistance, the Medicine Procedure Service will be happy to help. Please page the Procedure Service at [phone].    Dalbert JONETTA Moulds, MD  January 16, 2024 12:47 PM

## 2024-01-16 NOTE — Unmapped (Signed)
 Limited Abdominal Ultrasound (CPT [phone])    Indication: Cirrhosis with encephalopathy  Diagnosis: [name] cirrhosis    Findings (with representative still images, cine available in [employer]):  RUQ:       RLQ:       LUQ:      LLQ:       Impression: Free fluid absent all quadrants. No appropriate site seen for sampling.    Recommendation: Defer paracentesis    I personally performed the ultrasound and interpretation at the bedside.  Dalbert JONETTA Moulds, MD  January 16, 2024 12:43 PM

## 2024-01-16 NOTE — Unmapped (Signed)
 Tacrolimus  Therapeutic Monitoring Pharmacy Note    Richard [name] is a 58 y.o. male continuing tacrolimus .     Indication: Liver transplant     Date of Transplant: 02/2012      Prior Dosing Information: Home regimen 1 mg BID     Source(s) of information used to determine prior to admission dosing: Clinic Note    Goals:  Therapeutic Drug Levels  Tacrolimus  trough goal: 2-4 ng/ml    Additional Clinical Monitoring/Outcomes  Monitor renal function (SCr and urine output) and liver function (LFTs)  Monitor for signs/symptoms of adverse events (e.g., hyperglycemia, hyperkalemia, hypomagnesemia, hypertension, headache, tremor)    Previous Lab Values  Tacrolimus , Trough   Date/Time Value Ref Range Status   01/16/2024 06:02 AM 5.2 5.0 - 15.0 ng/mL Final   12/04/2022 06:32 AM 5.2 5.0 - 15.0 ng/mL Final   12/01/2022 07:02 AM 3.0 (L) 5.0 - 15.0 ng/mL Final   11/21/2022 07:31 AM 3.7 (L) 5.0 - 15.0 ng/mL Final   11/17/2022 04:45 AM 2.2 (L) 5.0 - 15.0 ng/mL Final   10/06/2014 09:00 AM 6.8 <=20.0 ng/mL Final   08/31/2014 08:34 AM 4.2  Final   08/03/2014 08:22 AM 4.7  Final   07/07/2014 08:37 AM 4.2  Final   06/09/2014 08:27 AM 3.5  Final     Tacrolimus , Timed   Date/Time Value Ref Range Status   01/11/2024 09:43 AM 8.9 ng/mL Final   12/24/2023 10:23 AM 18.1 ng/mL Final   12/04/2023 12:54 PM 3.6 ng/mL Final   11/29/2022 05:36 AM 5.0 ng/mL Final   11/27/2022 06:11 AM 4.4 ng/mL Final       Result:  Tacrolimus  level from today was drawn ~4 hours early     Pharmacokinetic Considerations and Significant Drug Interactions:  Concurrent CYP3A4 substrates/inhibitors: None identified    Assessment/Plan:  Recommendedation(s)  Continue current regimen of 1 mg BID   Tacrolimus  level drawn early today, so suspect true trough within goal range    Follow-up  MWF levels ordered.   A pharmacist will continue to monitor and recommend levels as appropriate    Please page service pharmacist with questions/clarifications.    Mickal Meno, PharmD

## 2024-01-16 NOTE — Unmapped (Incomplete)
 Pt may be a poor historian at this time due to AMS.  Will defer admission at this time

## 2024-01-16 NOTE — Unmapped (Addendum)
 Richard [name] 45bn male with [name] cirrhosis s/p transplant 2013 presents with AMS. 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 Clot found in RUE AV fistula.    Active Problems  AV Fistual Clot, RUE  C/f Thrombophlebitis  Patient endorsed extreme painfulness of RUE at the location of his fistula. Fistula previously evaluated for stenosis on 5/14 (ruled it OK). Reports of recirculation but no evidence of decreased flows or prolonged bleeding and fistula with thrill, pulsatility, and collapsibility at the time. No thrill or bruit detected on exams since admission. PVL preformed finding no evidence of obstruction in the central veins. Evidence of acute obstruction visualized in the brachiocephalic AVF. All other venous findings in the upper extremity are within normal limits. VIR attempted to remove clot though unsuccessful, HD line placed 7/25.  Dialyzed 7/26 through temporary line. Patient has lessened pain, erythema, warmth in right [facility] on exam on 7/28 as compared to previous. Consistent with thrombophlebitis versus cellulitis. Improvement in symptoms comes after starting empirical treatment with Keflex  for 5 days.  Patient able be discharged with temporary dialysis line in place with plan for outpatient correction of AV fistula. Eliquis  held upon discharge in light of upcoming interventions - AVF correction with Dr. Marchelle, colonoscopy 7/31. Patient discharged on outpatient pain regiment of 5 mg oxycodone  oral solution and 650mg  tylenol  q8.    Decompensated Cirrhosis vs steatosis of transplanted liver   Transaminitis  Patient with [name] cirrhosis s/p transplant (02/2012). Concern for cirrhosis post transplant, however liver biopsy performed 01/11/2024 indicated mild macrovesicular steatosis (5%) and no significant fibrosis.  Now with second admission for altered mental status likely 2/2 to HE as above. S/p recent biopsy iso transaminitis that was negative for cell mediated rejection. Pathophysiology of HE is likely due to constipation following recent med change to rifaximin  from lactulose  (7/18) and opioid use (for pain PRN following liver biopsy on 6/30). AST 128, ALT 105, Alkphos 391 on admission. Ursodiol  started 7/24 per GI recommendation given positive AMA and elevate ALP, although no findings on biopsy to suggest PBC.  Will pursue MRI abdomen with elastography outpatient given biopsy did not suggest advanced cirrhosis although clinical suspicion still elevated. Patient restarted on carvedilol  6.25 mg BID for variceal prophylaxis. Cellcept  250mg  BID and tacrolimus  1mg  BID continued. MELD-Na score of 7/24 of 20.     AMS-resolved  Hepatic encephalopathy-resolved  Pt presented with one day of gait disturbance and AMS. Pt with recent hospitalization (6/24-6/29) for same presentation where CT head and EEG were negative. AMS thought to be 2/2 to HE. Previously, pt not on lactulose  or Xifaxin. Started on lactulose  and rifaxamin since then, however per sister had decreased Bms in the last few days. With significant asterixis on exam on presentation, since resolving. Infectious w/up largely negative for other causes of AMS. Lactulose  uptitrated and patient improving with increased bowel movements. No fluid pocket available for paracentesis. Refine outpatient regiment for HE prophylaxis. Patient continued on lactulose  20g TID, titrated at 3-5 BM daily and Xifaxin 550mg  BID. Bcx showed no growth and UA negative.     Atrial flutter - Hx of persistent A FIb  Pt with hx of A fib, however EKG on admission c/w A flutter. Saw PCP outpatient, decided to restart A/C. Low heart rate, holding AV nodal blockers. Outpt colonoscopy scheduled for 7/31, eliquis  to be held for 4 days prior and was therefore held on discharge. Coreg  6.25mg  BID restarted 7/25 for variceal ppx and in setting of elevated Bps. Potassium  and magnesium  kept within normal limits.      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 Pt on TTS dialysis. Previous kidney transplant evaluation closed 09/2023 in the setting of elevated A1C, can be re-referred in six months. Plan for dialysis per patient's schedule. Of note- had VIR study of dialysis circuit that was planned for 7/22, cancelled given admitted to hospital.  Date of last dialysis before admission 7/19. HD performed 7/26 with 4 L UF removed using temporary HD catheter.     T2DM  A1c on 7/21 10. Long-standing Type 2 Diabetes Mellitus, diagnosed 2015, post-liver transplant. Recently established with endocrine. Ultimate goal to optimize candidacy for potential renal/liver transplant in the future. Home regimen includes Tresiba  60U nightly, Novolog  25U before meals, Mounjaro  12.5 qWeek. Has CGM, however has trouble interpreting data. Glucose was elevated at 290 after starting 10 units NPH morning/night and 5 units lispro TID with meals. Fasting glucose remained elevated at 284 after increasing to 60 units glargine/evening, 10 units lispro with meals.  Changed insulin  regimen to the following on 7/28: 60 units glargine each evening, 15 units lispro with meals. Mounjaro  held while inpatient, restarted to 7.5 on discharge. Received Gabapentin  300 mg nightly.     Gastric reflux  Patient with persistent feelings of gastric reflux, particularly while laying flat.  Pain not worse while ambulating. Regimen of Famotidine  20 mg twice daily and Pantoprazole  40 mg daily established.     HFpEF, NYHA class I  Pt with hx of HFpEF, currently not on any diuretics per chart review. ProBNP on admission elevated to 14,682. Last value documented is a BNP from 5/23, 603. No orthopnea, dyspnea on exertion. Not overtly volume overloaded at this time. Will plan for dialysis Saturday 7/26 with line access if AVF not available. No indications for emergent dialysis as of now. .  Last echo May/2024.  Myocardial perfusion scan December/2024 with LVEF >60%.     HTN - Recent hypotension  Pt with hx of uncontrolled HTN on coreg , clonidine, hydralazine . However, presented to OSH with significant hypotension. All anti-hypertensives stopped at that time. On presentation, pt with labile BP measuring 130s-170s. Bp have since recovered and have been consistently elevated. Patient has been restarted on home amlodipine  and coreg .   Patient to continue amlodipine  10mg  daily, Coreg  6.25mg  BID daily.

## 2024-01-16 NOTE — Unmapped (Signed)
 Nephrology ESKD Consult Note    Requesting provider: Garen Cone, MD  Service requesting consult: Richard General Welt (MDW)  Reason for consult: Evaluation for dialysis needs    Outpatient dialysis unit:   [facility]  [address]  [place] [address]     Assessment/Recommendations: Richard [name] is a 58 y.o. male with a past medical history notable for ESKD on in-center hemodialysis TTS being evaluated at [facility] for confusion with concerns for hepatic encephalopathy. Patient missed Hemodialysis on 7/22.     # ESKD:   Outpatient HD Dialysis Rx and medications    Dialysis schedule: TTS   Time: 4  hrs Temp: 36.5C   Dialyzer: Optiflux 180 NRE Heparin : Does not receive any heparin    Dialysate Bath: 3 K, 2.5Ca, Na 137, Bicarbonate 35   EDW: 120.5 kg      - OP dialysis prescription reviewed and ordered  - Indication for acute dialysis?: No, will determine based on labs from 7/23.   - We will perform HD starting tomorrow based on todays BMP and will otherwise attempt to continue pt's outpatient schedule if possible.   - Access: RUE AV fistula; Please consult VIR for evaluation of vascular access. Painful RUE AVF, no bruit or thrill- concern for clot   - Hepatitis status: Hepatitis B surface antigen negative on 11/27/2023.    # Volume / Hypertension:  - Volume: Will attempt to achieve dry weight if tolerated  - Will adjust daily as needed    # Acid-Base / Electrolytes:  - Will evaluate acid-base status and electrolyte balance daily and adjust dialysate as needed.  Lab Results   Component Value Date    K 4.8 01/15/2024    CO2 23.0 01/15/2024       # Anemia of Chronic Kidney Disease:  - ESA: Patient not on ESA  Date of last ESA dose  n/a  - IV Iron/ PO oral iron: no   - Outpatient meds reviewed and ordered   Lab Results   Component Value Date    HGB 13.2 01/15/2024       # Secondary Hyperparathyroidism/Hyperphosphatemia:  - Phosphate binders : Renvela  1600 TID   - Activated Vitamin D Analogs: Patient not on any activated Vitamin D analogs , Calcitriol n/a  - Calcimimetics: 190 mg 3 x weekly   - Outpatient meds reviewed and ordered   Lab Results   Component Value Date    PHOS 3.1 01/11/2024    CALCIUM  9.9 01/15/2024       # Hepatic Encephalopathy   #Cirrhosis of transplanted liver      - Evaluation and management per primary team  - No changes to management from a nephrology standpoint at this time    Rayfield DELENA Christ, DO  01/16/2024 12:21 PM   Medical decision-making for 01/16/24  Findings / Data     Patient has: []  acute illness w/systemic sxs  [mod]  []  two or more stable chronic illnesses [mod]  []  one chronic illness with acute exacerbation [mod]  []  acute complicated illness  [mod]  []  Undiagnosed new problem with uncertain prognosis  [mod] [x]  illness posing risk to life or bodily function (ex. AKI)  [high]  []  chronic illness with severe exacerbation/progression  [high]  []  chronic illness with severe side effects of treatment  [high] ESKD on RRT Probs At least 2:  Probs, Data, Risk   I reviewed: []  primary team note  []  consultant note(s)  [x]  external records [x]   chemistry results  [x]  CBC results  []  blood gas results  []  Other []  procedure/op note(s)   []  radiology report(s)  []  micro result(s)  []  w/ independent historian(s) Outside dialysis prescription reviewed , K and Hb addressed above >=3 Data Review (2 of 3)    I independently interpreted: []  Urine Sediment  []  Renal US  [x]  CXR Images  []  CT Images  []  Other []  EKG Tracing CXR with mild cephalization, low lung volumes  Any     I discussed: []  Pathology results w/ QHPs(s) from other specialties  []  Procedural findings w/ QHPs(s) from other specialties []  Imaging w/ QHP(s) from other specialties  [x]  Treatment plan w/ QHP(s) from other specialties Plan discussed with primary team Any     Mgm't requires: []  Prescription drug(s)  [mod]  []  Kidney biopsy  [mod]  []  Central line placement  [mod] []  High risk medication use and/or intensive toxicity monitoring [high]  [x]  Renal replacement therapy [high]  []  High risk kidney biopsy  [high]  []  Escalation of care  [high]  []  High risk central line placement  [high] RRT: High risk of complications from RRT requiring intensive monitoring Risk        _____________________________________________________________________________________    History of Present Illness: Richard [name] is a 58 y.o. male with ESKD on dialysis as well as cirrhosis of transplanted liver  being evaluated at [facility] for worsening confusion who is seen in consultation at the request of Richard Cone, MD and Richard [name] (MDW). Nephrology has been consulted for evaluation of dialysis needs in the setting of end-stage kidney disease. Of note patient was confused, having less bowel movements than usual. He was recently admitted about a month ago for similar symptoms. Of note patient states that his fistula is  more painful, there was a circuit study planned for 7/22 but was cancelled. On evaluation patient is pleasantly confused. Upon evaluation Ammonia level is 150. No acute indications to proceed for dialysis.         INPATIENT MEDICATIONS:Current Medications[1]    OUTPATIENT MEDICATIONS:  Prior to Admission medications   Medication Dose, Route, Frequency   amlodipine  (NORVASC ) 10 MG tablet 10 mg, Oral, Daily (standard)  Patient not taking: Reported on 01/11/2024   apixaban  (ELIQUIS ) 5 mg Tab 5 mg, Oral, 2 times a day (standard)   atorvastatin  (LIPITOR ) 40 MG tablet 40 mg, Oral, Daily (standard)  Patient not taking: Reported on 01/11/2024   blood-glucose meter,continuous (DEXCOM G6 RECEIVER) Misc 1 each, Miscellaneous, Daily, Dispense 1 receiver annually.  Sent to [facility]   blood-glucose sensor (DEXCOM G7 SENSOR) Devi Use 1 sensor every 10 days.   blood-glucose transmitter (DEXCOM G6 TRANSMITTER) Devi 1 each, Miscellaneous, Every 3 months, [facility] pharmacy   blood-glucose transmitter (DEXCOM G6 TRANSMITTER) Devi Use as directed to monitor blood glucose   carvedilol  (COREG ) 12.5 MG tablet 12.5 mg, 2 times a day (standard)  Patient not taking: Reported on 01/14/2024   CHOLECALCIFEROL, VITAMIN D3, (VITAMIN D3 ORAL) 2,000 Units, Daily (standard)   gabapentin  (NEURONTIN ) 300 MG capsule 300 mg, Oral, Nightly   insulin  aspart (NOVOLOG  FLEXPEN U-100 INSULIN ) 100 unit/mL (3 mL) injection pen 35 Units, Subcutaneous, 3 times a day (AC)   insulin  degludec (TRESIBA  FLEXTOUCH U-200) 200 unit/mL (3 mL) InPn 60 Units, Subcutaneous, Daily (standard), Max dose of 100 units per day.   lactulose  10 gram/15 mL solution 30 g, 2 times a day (standard)   lanthanum  (FOSRENOL ) 1000 MG chewable tablet 1,000 mg, Oral,  3 times a day (with meals)   magnesium  oxide (MAG-OX) 400 mg (241.3 mg elemental magnesium ) tablet 400 mg, Daily (standard)   multivitamin (TAB-A-VITE/THERAGRAN) per tablet 1 tablet, Daily (standard)   mycophenolate  (CELLCEPT ) 250 mg capsule 250 mg, Oral, 2 times a day (standard)   oxyCODONE  (ROXICODONE ) 5 MG immediate release tablet 5 mg, Oral, Every 4 hours PRN  Patient not taking: Reported on 01/14/2024   pen needle, diabetic (TRUEPLUS PEN NEEDLE) 32 gauge x 5/32 (4 mm) Ndle Use with insulin  up to 4 times a day as needed.   pen needle, diabetic 32 gauge x 5/32 (4 mm) Ndle Use with insulin  up to 4 times/day as needed.   polyethylene glycol (GOLYTELY ) 236-22.74-6.74 gram solution Take by mouth as directed per [facility] GI prep instructions, for split bowel prep.   rifAXIMin  (XIFAXAN ) 550 mg Tab 550 mg, Oral, 2 times a day (standard)   sildenafil (VIAGRA) 50 MG tablet 50 mg, Daily PRN  Patient not taking: Reported on 10/01/2023   tacrolimus  (PROGRAF ) 0.5 MG capsule 1 mg, Oral, 2 times a day   tirzepatide  (MOUNJARO ) 12.5 mg/0.5 mL PnIj 12.5 mg, Subcutaneous, Every 7 days   tirzepatide  (MOUNJARO ) 15 mg/0.5 mL PnIj 15 mg, Subcutaneous, Every 7 days        ALLERGIES  Patient has no known allergies.    MEDICAL HISTORY  Past Medical History[2]    SOCIAL HISTORY  Social History     Social History Narrative    From [place]    He was in the [employer].    Now lives in [place]    He works in Engineer, petroleum        Married    1 child (age 84.5)      reports that he has quit smoking. He has been exposed to tobacco smoke. He has never used smokeless tobacco. He reports that he does not drink alcohol and does not use drugs.     FAMILY HISTORY  Family History[3]     Physical Exam:  Vitals:    01/16/24 1205   BP: 145/108   Pulse: 68   Resp: 18   Temp:    SpO2: 99%     No intake/output data recorded.  No intake or output data in the 24 hours ending 01/16/24 1221    General: well-appearing, no acute distress  Heart: RRR, no m/r/g  Lungs: CTAB, normal wob  Abd: soft, non-tender, non-distended  Ext: trace  edema  Access: RUE AV fistula  without thrill or bruit   Neuro Confused        ;       [1]   Current Facility-Administered Medications:     apixaban  (ELIQUIS ) tablet 5 mg, Oral, BID    [Provider Hold] atorvastatin  (LIPITOR ) tablet 40 mg, Oral, Daily    dextrose  50 % in water (D50W) 50 % solution 12.5 g, Intravenous, Q15 Min PRN    dextrose  50 % in water (D50W) 50 % solution 12.5 g, Intravenous, Q15 Min PRN    glucagon  injection 1 mg, Intramuscular, Once PRN    glucagon  injection 1 mg, Intramuscular, Once PRN    glucose chewable tablet 16 g, Oral, Q10 Min PRN    glucose chewable tablet 16 g, Oral, Q10 Min PRN    insulin  lispro (HumaLOG ) injection CORRECTIONAL 0-20 Units, Subcutaneous, ACHS    insulin  lispro (HumaLOG ) injection CORRECTIONAL 0-20 Units, Subcutaneous, ACHS    insulin  NPH (HumuLIN ,NovoLIN ) injection 10 Units, Subcutaneous, Q12H [facility]  lactulose  (CEPHULAC ) packet 20 g, Oral, TID    mycophenolate  (CELLCEPT ) capsule 250 mg, Oral, BID    oxyCODONE  (ROXICODONE ) immediate release tablet 5 mg, Oral, Once PRN    rifAXIMin  (XIFAXAN ) tablet 550 mg, Oral, BID    tacrolimus  (PROGRAF ) capsule 1 mg, Oral, BID  [2]   Past Medical History:  Diagnosis Date    CHF (congestive heart failure) COVID-19 07/18/2019    Diabetes mellitus        Family history of malignant neoplasm of prostate 06/23/2015    Heart disease     Heart murmur     Hepatic cirrhosis    06/23/2015    Overview:  Secondary to NASH; followed by Dr. Lindaann, GI.  Last Assessment & Plan:  Relevant Hx: Course: Daily Update: Today's Plan:     Hypertension     Hypogonadism in male 02/12/2014    Kidney stone     Liver cirrhosis secondary to NASH (nonalcoholic steatohepatitis)        s/p liver transplant 2013    Obesity    [3]   Family History  Problem Relation Age of Onset    Diabetes Father     Diabetes Paternal Grandfather     Liver disease Maternal Uncle     Cancer Maternal Grandmother     Melanoma Neg Hx     Basal cell carcinoma Neg Hx     Squamous cell carcinoma Neg Hx     Kidney disease Neg Hx

## 2024-01-16 NOTE — Unmapped (Signed)
 Internal Medicine (MEDW) History & Physical    Assessment & Plan:   Richard [name] is a 58 y.o. 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     Principal Problem:    Hepatic encephalopathy     Active Problems:    Hypertension    Type 2 diabetes mellitus with hyperglycemia, with long-term current use of insulin        H/O gastroesophageal reflux (GERD)    Liver replaced by transplant       Obstructive sleep apnea syndrome    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)    (HFpEF) heart failure with preserved ejection fraction       CAD (coronary artery disease)    Cirrhosis of transplanted liver       Active Problems  AMS likely 2/2 to HE  Pt presented with one day of gait disturbance and AMS. Pt with recent hospitalization (6/24-6/29) for same presentation where CT head and EEG were negative. AMS thought to be 2/2 to HE. Previously, pt not on lactulose  or Xifaxin . Started on lactulose  and rifaxamin since then, however per sister had decreased Bms in the last few days. With significant asterixis on exam. Will r/o other causes of AMS with infectious w/up. Will continue to uptitrate lactulose  as needed. Will hold centrally acting medications as well. Of note- patient had recent dispense of oxycodone  on 7/19 which may have worsened constipation or AMS as well.   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 Unfortunately, now with cirrhosis of transplanted liver. Now with second admission for altered mental status likely 2/2 to HE as above. S/p recent biopsy iso transaminitis that was negative for cell mediated rejection. AMA positive c/w PBC, plan to start urasadiol post liver bx, will discuss with hepatology. AST 128, ALT 105, Alkphos 391 on admission.  -Consider starting ursodiol    -Med M consulted for diag para if patient has pocket  -Hepatology consulted, appreciate reccs  -Variceal ppx - unable to tolerate coreg  due to hypotension  -Continue cellcept  250mg  BID  -Tacrolimus  1mg  BID  -AM tac trough  -HE mgmt:   -Lactulose  20g 3x a day   -Rifaxamin 550mg  BID     MELD 3.0: 19 at 01/15/2024  3:02 PM  MELD-Na: 21 at 01/15/2024  3:02 PM  Calculated from:  Serum Creatinine: On dialysis. Using the maximum value.  Serum Sodium: 136 mmol/L at 01/15/2024  3:02 PM  Total Bilirubin: 0.9 mg/dL (Using min of 1 mg/dL) at 2/77/7974  6:97 PM  Serum Albumin: 3.3 g/dL at 2/77/7974  6:97 PM  INR(ratio): 0.98 (Using min of 1) at 01/15/2024  3:02 PM  Age at listing (hypothetical): 58 years  Sex: Male at 01/15/2024  3:02 PM    Atrial flutter - Hx of persistent A FIb  Pt with hx of A fib, however EKG on admission c/w A flutter. Saw PCP outpatient, decided to restart A/C. Previously on coreg  but unable to tolerate  iso hypotension as below.  -Repeat EKG  -Continue Eliquis  5mg  BID  -Hold coreg  6.25mg  daily  -K>4, Mg>2    ESRD on iHD  ESRD due to CNI toxicity and hypertension (biopsy 05/2019). Pt on TTS dialysis. Previous kidney transplant evaluation closed 09/2023 in the setting of elevated A1C, can be re-referred in six months. Plan for dialysis per patient's schedule. Of note- had VIR study of dialysis circuit that was planned for 7/22, cancelled given admitted to hospital.   -Dialysis TTS  -Nephrology consulted, page in AM  -Confirm if patient is taking Sevalamer 1600mg  TID  -BMP daily     T2DM  A1c on 7/21 10. Long-standing Type 2 Diabetes Mellitus, diagnosed around 2015, post-liver transplant. Recently established with endocrine. Ultimate goal to optimize candidacy for potential renal/liver transplant in the future. Home regimen includes Tresiba  60U nightly, Novolog  25U before meals, Mounjaro  12.5 qWeek. Has CGM, however has trouble interpreting data. Given low PO intake, will start with SSI and uptitrate as needed.  -POCT glucose checks  -SSI  -Hypoglycemia protocol  -Hold home Mounjaro     HFpEF  Pt with hx of HFpEF, currently not on any diuretics per chart review. ProBNP on admission elevated to 14,682. Last value documented is a BNP from 5/23, 603. No orthopnea, dyspnea on exertion. Not overtly volume overloaded at this time. Will plan for dialysis tomorrow.  -Daily BMP  -Dialysis as above    HTN - Recent hypotension  Pt with hx of uncontrolled HTN on coreg , clonidine, hydralazine . However, presented to OSH with significant hypotension. All anti-hypertensives stopped at that time. On presentation, pt with labile BP measuring 130s-170s. Will consider re initiation of home BP meds pending blood pressures post dialysis.   -Hold amlodipine  10mg  daily  -Hold Coreg  6.25mg  daily    Chronic Problems  Hx DVT - Continue Eliquis  5mg  BID  HLD- Pt prescribed atorvastatin  40mg  daily, however reportedly not taking. Will restart.  Hx of OSA- No longer uses CPAP after losing 40 lbs.  CAD- NM spect in 2024 with moderate sized, mild in severity, fixed perfusion defect mid anterior, apical anterior and apical segments. No CP on admission.    The patient's presentation is complicated by the following clinically significant conditions requiring additional evaluation and treatment: - Altered mental status secondary to - Metabolic Encephalopathy POA requiring further investigation or monitoring  - Chronic kidney disease POA requiring further investigation, treatment, or monitoring   - Immunocompromise state POA requiring further investigation, treatment, or monitoring     Issues Impacting Complexity of Management:  -Intensive monitoring of drug toxicity from Tacrolimus  with scheduled levels and Mycophenolate  sodium (Myfortic ) with scheduled levels  -Need for the following intensive monitoring parameter(s) due to high risk of clinical decline: scheduled neuro checks    Medical Decision Making: Reviewed records from the following unique sources  OSH records, hepatology, endocrine, PCP notes.    Checklist:  Diet: Regular Diet  DVT PPx: Patient Already on Full Anticoagulation with Eliquis  5mg  BID  Code Status: Full Code  Dispo: Patient appropriate for Observation based on expectation at time of admission that period of observation will last less than two midnights    Team Contact Information:   Primary Team: Internal Medicine (MEDW)  Primary Resident: Porter Carrel, MD  Resident's Pager: [phone] [facility] [facility] Med[facility] Senior Resident)    Chief Concern:   Hepatic encephalopathy           Subjective:   Richard [name] is a 58 y.o. 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       History obtained by patient and patient's sister given AMS.    HPI:  Patient ANO x 2 on exam.  Notes that he came to [facility] as confused however unable to give further history.    Called patient's sister who stated that yesterday she called her brother after not hearing from him for a day or 2.  Noticed he was more confused on the phone.  Stated her brother mentioned that he was having trouble walking off.  Instructed him to present to the emergency department.  Sister states that patient has not had any trouble with mental status until June when he presented to outside hospital.  At that time, repeat imaging of his abdomen showed concern for cirrhosis of his transplanted liver.  He was started on lactulose  after thorough workup was completed to rule out other causes of altered mental status.  He then followed up with hepatology in clinic who started him on rifaximin  as well.    Patient's sister states that this week her brother had mentioned he was having fewer bowel movements.  She states when he was just taking lactulose  he was having several bowel movements however she cannot recall how many.  Patient lives with his girlfriend however he manages his medications by himself.  She notes being worried about him being able to manage his medications moving forward.    Pertinent Surgical Hx  Liver transplant in 02/2012    Pertinent Family Hx  NAI    Pertinent Social Hx   Lives with girlfriend at home. Tries to titrate medications himself. Unable to give more hx at this time.    In the ED:  Vitals: Afeb, HR 80-112, RR 16-25, BP 92/75-184/91, 95% on room air  Labs: Notable for glucose 303, Hbg 13.2, Plt 201, K 4.8, Cr 11.51, AST 128, ALT 105, Alk phos 391, Ammonia 150, Pro-BNP 14682, TSH 1.401, INR 0.98  Cultures: NAI  Imaging: Cardiomegaly, mildly enlarged hemidiaphragm.  ED Interventions: Lactulose , tac, myfortic     Allergies  Patient has no known allergies.    I was NOT able to review the Medication List with the patient or a representative. Further medication reconciliation is needed.  Prior to Admission medications   Medication Dose, Route, Frequency   amlodipine  (NORVASC ) 10 MG tablet 10 mg, Oral, Daily (standard)  Patient not taking: Reported on 01/11/2024   apixaban  (ELIQUIS ) 5 mg Tab 5 mg, Oral, 2 times a day (standard)   atorvastatin  (LIPITOR ) 40 MG tablet 40 mg, Oral, Daily (standard)  Patient not taking: Reported on 01/11/2024   blood-glucose meter,continuous (DEXCOM G6 RECEIVER) Misc 1 each, Miscellaneous, Daily, Dispense 1 receiver annually.  Sent to [facility]   blood-glucose sensor (DEXCOM G7 SENSOR) Devi Use 1 sensor every 10 days.   blood-glucose transmitter (DEXCOM G6 TRANSMITTER) Devi 1 each, Miscellaneous, Every 3 months, [facility] pharmacy blood-glucose transmitter (DEXCOM G6 TRANSMITTER) Devi Use as directed to monitor blood glucose   carvedilol  (COREG ) 12.5 MG tablet 12.5 mg, 2 times a day (standard)  Patient not taking: Reported on 01/14/2024   CHOLECALCIFEROL, VITAMIN D3, (VITAMIN D3 ORAL) 2,000 Units, Daily (standard)   gabapentin  (NEURONTIN ) 300 MG capsule 300 mg, Oral, Nightly   insulin  aspart (NOVOLOG  FLEXPEN U-100 INSULIN ) 100 unit/mL (3 mL) injection pen 35 Units, Subcutaneous,  3 times a day (AC)   insulin  degludec (TRESIBA  FLEXTOUCH U-200) 200 unit/mL (3 mL) InPn 60 Units, Subcutaneous, Daily (standard), Max dose of 100 units per day.   lactulose  10 gram/15 mL solution 30 g, 2 times a day (standard)   lanthanum  (FOSRENOL ) 1000 MG chewable tablet 1,000 mg, Oral, 3 times a day (with meals)   magnesium  oxide (MAG-OX) 400 mg (241.3 mg elemental magnesium ) tablet 400 mg, Daily (standard)   multivitamin (TAB-A-VITE/THERAGRAN) per tablet 1 tablet, Daily (standard)   mycophenolate  (CELLCEPT ) 250 mg capsule 250 mg, Oral, 2 times a day (standard)   oxyCODONE  (ROXICODONE ) 5 MG immediate release tablet 5 mg, Oral, Every 4 hours PRN  Patient not taking: Reported on 01/14/2024   pen needle, diabetic (TRUEPLUS PEN NEEDLE) 32 gauge x 5/32 (4 mm) Ndle Use with insulin  up to 4 times a day as needed.   pen needle, diabetic 32 gauge x 5/32 (4 mm) Ndle Use with insulin  up to 4 times/day as needed.   polyethylene glycol (GOLYTELY ) 236-22.74-6.74 gram solution Take by mouth as directed per [facility] GI prep instructions, for split bowel prep.   rifAXIMin  (XIFAXAN ) 550 mg Tab 550 mg, Oral, 2 times a day (standard)   sildenafil (VIAGRA) 50 MG tablet 50 mg, Daily PRN  Patient not taking: Reported on 10/01/2023   tacrolimus  (PROGRAF ) 0.5 MG capsule 1 mg, Oral, 2 times a day   tirzepatide  (MOUNJARO ) 12.5 mg/0.5 mL PnIj 12.5 mg, Subcutaneous, Every 7 days   tirzepatide  (MOUNJARO ) 15 mg/0.5 mL PnIj 15 mg, Subcutaneous, Every 7 days       Librarian, academic:  Mr. Leaf currently lacks decisional capacity for healthcare decision-making and is unable to designate a surrogate healthcare decision maker. Mr. Monday's designated healthcare decision maker(s) is/are [name] (the patient's adult sibling) as denoted by hospital policy for patients without a known preference.    Objective:   Physical Exam:  Temp:  [36.6 ??C (97.8 ??F)-37.1 ??C (98.7 ??F)] 37.1 ??C (98.7 ??F)  Pulse:  [60-112] 82  SpO2 Pulse:  [56-74] 58  Resp:  [16-25] 21  BP: (92-184)/(71-116) 154/91  SpO2:  [93 %-100 %] 96 %    Gen: NAD, converses   Eyes: Sclera anicteric, EOMI grossly normal   HENT: Atraumatic, normocephalic  Neck: Trachea midline  Heart: Irregularly irregular rhythm  Lungs: CTAB, no crackles or wheezes  Abdomen: Soft, mildly distended abdomen, no TTP  Extremities: No edema  Neuro: Grossly symmetric, non-focal, significant asterixis on exam, 5/5 strength  Skin:  No rashes, lesions on clothed exam  Psych: Alert, oriented to person and place but not time

## 2024-01-16 NOTE — Unmapped (Signed)
 Internal Medicine (MEDW) Progress Note    Assessment & Plan:   Richard [name] is a 58 y.o. 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     Principal Problem:    Hepatic encephalopathy     Active Problems:    Hypertension    Type 2 diabetes mellitus with hyperglycemia, with long-term current use of insulin        H/O gastroesophageal reflux (GERD)    Liver replaced by transplant       Obstructive sleep apnea syndrome    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)    (HFpEF) heart failure with preserved ejection fraction       CAD (coronary artery disease)    Cirrhosis of transplanted liver         Active Problems  AMS likely 2/2 to HE  Pt presented with one day of gait disturbance and AMS. Pt with recent hospitalization (6/24-6/29) for same presentation where CT head and EEG were negative. AMS thought to be 2/2 to HE. Previously, pt not on lactulose  or Xifaxin . Started on lactulose  and rifaxamin since then, however per sister had decreased Bms in the last few days. With significant asterixis on exam. Will r/o other causes of AMS with infectious w/up. Will continue to uptitrate lactulose  as needed. Will hold centrally acting medications as well. Of note- patient had recent dispense of oxycodone  on 7/19 which may have worsened constipation or AMS as well.   -Hold gabapentin  pending improvement in mental status  -Continue lactulose  20g TID, titrate to 3-5 Bms daily   -Continue Xifaxin 550mg  BID  -Recent dispense of Oxy 5mg  on 7/19  -Delirium precautions   -Bcx x2, UA pending  -No tappable pocket for diag para    AV Fistual Clot, RUE  Patient endorsed extreme painfulness of RUE at the location of his fistula. Fistula previously evaluated for stenosis on 5/14 (rule it OK). Reports of recirculation but no evidence of decreased flows or prolonged bleeding and fistula with thrill, pulsatility, and collapsibility at the time. Circuit study planned for 7/22 but was cancelled. No thrill or bruit detected on exam 01/16/24. Bedside US  performed, identifying possible clot. Nephrology and VIR consulted and PVL ordered, anticipate circuit study.   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 Unfortunately, now with cirrhosis of transplanted liver. Now with second admission for altered mental status likely 2/2 to HE as above. S/p recent biopsy iso transaminitis that was negative for cell mediated rejection. Pathophysiology of HE is likely due to constipation following recent med change to rifaximin  from lactulose  (7/18) and opioid use (for pain PRN following liver biopsy on 6/30). However, will need to rule out fistula pathology and potential infection as source of his HE. AST 128, ALT 105, Alkphos 391 on admission. Hepatology recommendation to start ursodiol  given positive AMA and elevate ALP, although no findings on biopsy to suggest PBC. Hepatology also recommended MRI abdomen with elastography since biopsy did not suggest advanced fibrosis, which is incongruent with his evidence of portal hypertension and varices on imaging, and hx of encephalopathy, presumably HE. Again, could be that HE is multifactorial.  -Start ursodiol    -MRI abdomen elastography  -Diag para deferred given no  appropriate site seen for sampling and absence of free fluid in all quadrants  -Variceal ppx - unable to tolerate coreg  due to hypotension  -Continue cellcept  250mg  BID  -Tacrolimus  1mg  BID  -AM tac trough within goal range, continue on current regiment  -HE mgmt:              -Lactulose  20g 3x a day (taper to 3-5 bowel movements)              -Rifaxamin 550mg  BID    MELD 3.0: 21 at 01/16/2024 12:05 PM  MELD-Na: 22 at 01/16/2024 12:05 PM  Calculated from:  Serum Creatinine: On dialysis. Using the maximum value.  Serum Sodium: 135 mmol/L at 01/16/2024 12:05 PM  Total Bilirubin: 1.4 mg/dL at 2/76/7974 87:94 PM  Serum Albumin: 3.1 g/dL at 2/76/7974 87:94 PM  INR(ratio): 0.98 (Using min of 1) at 01/15/2024  3:02 PM  Age at listing (hypothetical): 58 years  Sex: Male at 01/16/2024 12:05 PM      Atrial flutter - Hx of persistent A FIb  Pt with hx of A fib, however EKG on admission c/w A flutter. Saw PCP outpatient, decided to restart A/C. Previously on coreg  but unable to tolerate iso hypotension as below.  -Repeat EKG  -Continue Eliquis  5mg  BID  -Hold coreg  6.25mg  daily  -K>4, Mg>2     ESRD on iHD  ESRD due to CNI toxicity and hypertension (biopsy 05/2019). Pt on TTS dialysis. Previous kidney transplant evaluation closed 09/2023 in the setting of elevated A1C, can be re-referred in six months. Plan for dialysis per patient's schedule. Of note- had VIR study of dialysis circuit that was planned for 7/22, cancelled given admitted to hospital.   -Dialysis TTS; last session yesterday  -Nephrology consulted, plan to perform HD starting 7/24 based on 7/23 BMP and otherwise continue pt's outpt schedule  -Confirm if patient is taking Sevalamer 1600mg  TID  -BMP daily      T2DM  A1c on 7/21 10. Long-standing Type 2 Diabetes Mellitus, diagnosed around 2015, post-liver transplant. Recently established with endocrine. Ultimate goal to optimize candidacy for potential renal/liver transplant in the future. Home regimen includes Tresiba  60U nightly, Novolog  25U before meals, Mounjaro  12.5 qWeek. Has CGM, however has trouble interpreting data. Given low PO intake, will start with SSI and uptitrate as needed.  -POCT glucose checks  -SSI  -Hypoglycemia protocol  -Hold home Mounjaro      HFpEF  Pt with hx of HFpEF, currently not on any diuretics per chart review. ProBNP on admission elevated to 14,682. Last value documented is a BNP from 5/23, 603. No orthopnea, dyspnea on exertion. Not overtly volume overloaded at this time. Will plan for dialysis tomorrow.  -Daily BMP  -Dialysis as above     HTN - Recent hypotension  Pt with hx of uncontrolled HTN on coreg , clonidine, hydralazine . However, presented to OSH with significant hypotension. All anti-hypertensives stopped at that time. On presentation, pt with labile BP measuring 130s-170s. Will consider re initiation of home BP meds pending blood pressures post dialysis.   -Hold amlodipine  10mg  daily  -Hold Coreg  6.25mg  daily      Chronic Problems  Hx DVT - Continue Eliquis  5mg  BID  HLD- Pt prescribed atorvastatin  40mg  daily, however reportedly not taking. Held on 7/23 due to decompensated cirrhosis.   Hx of OSA- No longer uses CPAP after losing 40 lbs.  CAD- NM spect in 2024 with moderate sized, mild in severity, fixed perfusion defect mid anterior, apical anterior and  apical segments. No CP on admission.      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      Medical Decision Making: Reviewed records from the following unique sources  EPIC, sister.      Daily Checklist:  Diet: Regular Diet  DVT PPx: Patient Already on Full Anticoagulation with eliquis  5mg  BID  Electrolytes: No Repletion Needed  Code Status: Full Code  Dispo:  Patient appropriate for Observation based on expectation at time of admission that period of observation will last less than two midnights    Team Contact Information:   Primary Team: Internal Medicine (MEDW)  Primary Resident: Artist Ill, MD  M3: Slater LELON Daring  Resident's Pager: [phone] (Gen MedW Intern - [place])    Interval History:   Patient resting on exam. Expresses that he feels less confused then yesterday, although cannot remember what day it is or if he got dialysis yesterday. Disoriented somewhat. Priority complaint is pain on RUE where dialysis is located. Asterixis is also appreciated.     ROS: deferred given patient's AMS.    Objective:   Temp:  [36.6 ??C (97.9 ??F)-37.1 ??C (98.7 ??F)] 36.8 ??C (98.2 ??F)  Pulse:  [60-98] 68  SpO2 Pulse:  [56-70] 68  Resp:  [16-25] 18  BP: (92-184)/(71-108) 145/108  SpO2:  [94 %-100 %] 99 %    Gen: NAD, Aox2   HENT: atraumatic, normocephalic, wound from liver bx line on left neck  Heart: RRR  Lungs: CTAB, no crackles or wheezes  Abdomen: soft, NTND, ascites  Extremities: No edema, warmth and erythematous spot on dialysis fistula on RUE with extreme tenderness to touch    I attest that I have reviewed note and that the components of the history of the present illness, the physical exam, and the assessment and plan documented were performed by me or were performed in my presence by the student and verified by me.    [name], MD

## 2024-01-16 NOTE — Unmapped (Signed)
 error

## 2024-01-16 NOTE — Unmapped (Signed)
 Pathology conference  01/16/2024     Background: 58 y.o. 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 Recently developed elevated liver biochemistries. Also developed HE and has had imaging (CT 12/21/23) suggesting cirrhosis.     Liver biopsy reviewed: 01/11/24    Findings:   No evidence of rejection or ductopenia.  Moderate zone 3 sinusoidal dilatation   Mild macrovesicular steatosis (5%)   No significant fibrosis.      Recommendations:   MRI abdomen with elastography since biopsy did not suggest advanced fibrosis. However, he has evidence of portal hypertension and varices on imaging and has had encephalopathy, presumably HE. However, could be multifactorial (uremia).  Consider trial of ursodiol  given positive AMA and elevated ALP, though no findings on biopsy to suggest PBC.

## 2024-01-17 LAB — COMPREHENSIVE METABOLIC PANEL
ALBUMIN: 3.1 g/dL — ABNORMAL LOW (ref 3.4–5.0)
ALKALINE PHOSPHATASE: 492 U/L — ABNORMAL HIGH (ref 46–116)
ALT (SGPT): 100 U/L — ABNORMAL HIGH (ref 10–49)
ANION GAP: 14 mmol/L (ref 5–14)
AST (SGOT): 28 U/L (ref <=34)
BILIRUBIN TOTAL: 0.9 mg/dL (ref 0.3–1.2)
BLOOD UREA NITROGEN: 53 mg/dL — ABNORMAL HIGH (ref 9–23)
BUN / CREAT RATIO: 4
CALCIUM: 9 mg/dL (ref 8.7–10.4)
CHLORIDE: 103 mmol/L (ref 98–107)
CO2: 22 mmol/L (ref 20.0–31.0)
CREATININE: 14.95 mg/dL — ABNORMAL HIGH (ref 0.73–1.18)
EGFR CKD-EPI (2021) MALE: 3 mL/min/1.73m2 — ABNORMAL LOW (ref >=60)
GLUCOSE RANDOM: 225 mg/dL — ABNORMAL HIGH (ref 70–179)
POTASSIUM: 3.8 mmol/L (ref 3.4–4.8)
PROTEIN TOTAL: 6.6 g/dL (ref 5.7–8.2)
SODIUM: 139 mmol/L (ref 135–145)

## 2024-01-17 LAB — CBC
HEMATOCRIT: 39.9 % (ref 39.0–48.0)
HEMOGLOBIN: 12.7 g/dL — ABNORMAL LOW (ref 12.9–16.5)
MEAN CORPUSCULAR HEMOGLOBIN CONC: 31.8 g/dL — ABNORMAL LOW (ref 32.0–36.0)
MEAN CORPUSCULAR HEMOGLOBIN: 28.7 pg (ref 25.9–32.4)
MEAN CORPUSCULAR VOLUME: 90.5 fL (ref 77.6–95.7)
MEAN PLATELET VOLUME: 9.1 fL (ref 6.8–10.7)
PLATELET COUNT: 185 10*9/L (ref 150–450)
RED BLOOD CELL COUNT: 4.41 10*12/L (ref 4.26–5.60)
RED CELL DISTRIBUTION WIDTH: 14.1 % (ref 12.2–15.2)
WBC ADJUSTED: 4.9 10*9/L (ref 3.6–11.2)

## 2024-01-17 LAB — APTT
APTT: 32.3 s (ref 24.8–38.4)
APTT: 76.8 s — ABNORMAL HIGH (ref 24.8–38.4)
HEPARIN CORRELATION: 0.2
HEPARIN CORRELATION: 0.4

## 2024-01-17 LAB — MAGNESIUM: MAGNESIUM: 2.5 mg/dL (ref 1.6–2.6)

## 2024-01-17 LAB — PHOSPHORUS: PHOSPHORUS: 4.4 mg/dL (ref 2.4–5.1)

## 2024-01-17 MED ADMIN — insulin lispro (HumaLOG) injection CORRECTIONAL 0-20 Units: 0-20 [IU] | SUBCUTANEOUS | @ 21:00:00

## 2024-01-17 MED ADMIN — tacrolimus (PROGRAF) capsule 1 mg: 1 mg | ORAL | @ 12:00:00

## 2024-01-17 MED ADMIN — heparin 25,000 Units/250 mL (100 units/mL) in 0.45% saline infusion (premade): 0-24 [IU]/kg/h | INTRAVENOUS | @ 13:00:00

## 2024-01-17 MED ADMIN — mycophenolate (CELLCEPT) capsule 250 mg: 250 mg | ORAL | @ 12:00:00

## 2024-01-17 MED ADMIN — insulin lispro (HumaLOG) injection CORRECTIONAL 0-20 Units: 0-20 [IU] | SUBCUTANEOUS | @ 01:00:00

## 2024-01-17 MED ADMIN — tacrolimus (PROGRAF) capsule 1 mg: 1 mg | ORAL

## 2024-01-17 MED ADMIN — mycophenolate (CELLCEPT) capsule 250 mg: 250 mg | ORAL | @ 01:00:00

## 2024-01-17 MED ADMIN — insulin NPH (HumuLIN,NovoLIN) injection 10 Units: 10 [IU] | SUBCUTANEOUS | @ 01:00:00

## 2024-01-17 MED ADMIN — lactulose (CEPHULAC) packet 20 g: 20 g | ORAL | @ 19:00:00

## 2024-01-17 MED ADMIN — tacrolimus (PROGRAF) capsule 1 mg: 1 mg | ORAL | @ 01:00:00

## 2024-01-17 MED ADMIN — mycophenolate (CELLCEPT) capsule 250 mg: 250 mg | ORAL

## 2024-01-17 MED ADMIN — ursodiol (ACTIGALL) capsule 600 mg: 600 mg | ORAL | @ 17:00:00

## 2024-01-17 MED ADMIN — insulin lispro (HumaLOG) injection CORRECTIONAL 0-20 Units: 0-20 [IU] | SUBCUTANEOUS | @ 12:00:00

## 2024-01-17 MED ADMIN — lactulose oral solution: 20 g | ORAL | @ 01:00:00 | Stop: 2024-01-16

## 2024-01-17 MED ADMIN — insulin NPH (HumuLIN,NovoLIN) injection 10 Units: 10 [IU] | SUBCUTANEOUS | @ 12:00:00

## 2024-01-17 MED ADMIN — amlodipine (NORVASC) tablet 10 mg: 10 mg | ORAL | @ 17:00:00

## 2024-01-17 MED ADMIN — rifAXIMin (XIFAXAN) tablet 550 mg: 550 mg | ORAL | @ 12:00:00 | Stop: 2024-01-30

## 2024-01-17 MED ADMIN — acetaminophen (TYLENOL) tablet 325 mg: 325 mg | ORAL | @ 01:00:00 | Stop: 2024-01-16

## 2024-01-17 MED ADMIN — oxyCODONE (ROXICODONE) immediate release tablet 2.5 mg: 2.5 mg | ORAL | @ 19:00:00 | Stop: 2024-01-17

## 2024-01-17 MED ADMIN — lactulose (CEPHULAC) packet 20 g: 20 g | ORAL | @ 12:00:00

## 2024-01-17 MED ADMIN — rifAXIMin (XIFAXAN) tablet 550 mg: 550 mg | ORAL | @ 01:00:00 | Stop: 2024-01-30

## 2024-01-17 MED ADMIN — insulin lispro (HumaLOG) inj PERCENTAGE MEAL EATEN 5 Units: 5 [IU] | SUBCUTANEOUS | @ 17:00:00

## 2024-01-17 MED ADMIN — insulin lispro (HumaLOG) injection CORRECTIONAL 0-20 Units: 0-20 [IU] | SUBCUTANEOUS | @ 15:00:00

## 2024-01-17 MED ADMIN — lactulose (CEPHULAC) packet 20 g: 20 g | ORAL | @ 01:00:00

## 2024-01-17 MED ADMIN — lidocaine (ASPERCREME) 4 % 1 patch: 1 | TRANSDERMAL | @ 17:00:00

## 2024-01-17 MED ADMIN — apixaban (ELIQUIS) tablet 5 mg: 5 mg | ORAL | @ 01:00:00

## 2024-01-17 NOTE — Unmapped (Signed)
 Attempted to call patient regarding upcoming GI procedure on 01/24/24.  No answer. LVM for patient to call nurse line with any questions.

## 2024-01-17 NOTE — Unmapped (Signed)
 The patient was seen asleep in bed with spontaneous breathing. He is alert and oriented. He reports having 2 bowel movements. He was re-educated in the importance of taking his lactulose . Vitals signs taken and recorded. Blood sugar monitored and due insulin  given. Monitored accordingly. Needs attended. Plan of care ongoing.     Shift Summary  Blood glucose levels increased from 156 mg/dL to 772 mg/dL during the shift.    Acute pain in the right arm was reported and managed with acetaminophen  administration.    Lactulose  was administered to address hepatic encephalopathy.    Positioning and skin protection measures were consistently applied to prevent hospital-acquired injuries.    Overall, the patient's condition remained stable with no falls or infections reported.     Absence of Hospital-Acquired Illness or Injury: Positioning was regularly adjusted to prevent skin breakdown, and adhesive use was limited while incontinence pads were utilized intermittently. Skin assessments remained within defined limits throughout the shift.     Absence of Infection Signs and Symptoms: Temperature remained stable with a slight increase noted during the shift.     Absence of Fall and Fall-Related Injury: Fall risk interventions were consistently applied, including regular toileting and hourly visual checks, with no falls reported.     Optimal Pain Control and Function: Acute pain in the right arm was reported at the beginning of the shift, rated at 8 on the pain scale, and acetaminophen  was administered.

## 2024-01-17 NOTE — Unmapped (Signed)
 Internal Medicine (MEDW) Progress Note    Assessment & Plan:   Richard [name] is a 58 y.o. 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     Principal Problem:    Hepatic encephalopathy     Active Problems:    Hypertension    Type 2 diabetes mellitus with hyperglycemia, with long-term current use of insulin        H/O gastroesophageal reflux (GERD)    Liver replaced by transplant       Obstructive sleep apnea syndrome    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)    (HFpEF) heart failure with preserved ejection fraction       CAD (coronary artery disease)    Cirrhosis of transplanted liver         Active Problems  AMS likely 2/2 to HE  Pt presented with one day of gait disturbance and AMS. Pt with recent hospitalization (6/24-6/29) for same presentation where CT head and EEG were negative. AMS thought to be 2/2 to HE. Previously, pt not on lactulose  or Xifaxin. Started on lactulose  and rifaxamin since then, however per sister had decreased Bms in the last few days. With significant asterixis on exam. Will r/o other causes of AMS with infectious w/up. Will continue to uptitrate lactulose  as needed.  Patient improving with increased bowel movements.No fluid pocket available for paracentesis  -Hold gabapentin  pending improvement in mental status  -Continue lactulose  20g TID, titrate to 3-5 Bms daily   -Continue Xifaxin 550mg  BID  -Delirium precautions   -Bcx NGTD, UA pending  - Ursodiol  600 mg twice daily    AV Fistual Clot, RUE  Patient endorsed extreme painfulness of RUE at the location of his fistula. Fistula previously evaluated for stenosis on 5/14 (ruled it OK). Reports of recirculation but no evidence of decreased flows or prolonged bleeding and fistula with thrill, pulsatility, and collapsibility at the time. No thrill or bruit detected on exam 01/17/24.  Potential clot identified on POCUS.   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 Unfortunately, now with cirrhosis of transplanted liver. Now with second admission for altered mental status likely 2/2 to HE as above. S/p recent biopsy iso transaminitis that was negative for cell mediated rejection. Pathophysiology of HE is likely due to constipation following recent med change to rifaximin  from lactulose  (7/18) and opioid use (for pain PRN following liver biopsy on 6/30). AST 128, ALT 105, Alkphos 391 on admission. Ursodiol  started 7/24 per GI recommendation given positive AMA and elevate ALP, although no findings on biopsy to suggest PBC.  Will pursue MRI abdomen with elastography given biopsy did not suggest advanced cirrhosis although clinical suspicion still elevated  -Ursodiol  started 7/24  -MRI abdomen elastography  -Variceal ppx -carvedilol  held due to low heart rate  -Continue cellcept  250mg  BID  -Tacrolimus  1mg  BID  -7/23 tac trough within goal range, continue on current regimen    MELD 3.0: 18 at 01/17/2024  9:36 AM  MELD-Na: 20 at 01/17/2024  9:36  AM  Calculated from:  Serum Creatinine: On dialysis. Using the maximum value.  Serum Sodium: 139 mmol/L (Using max of 137 mmol/L) at 01/17/2024  9:36 AM  Total Bilirubin: 0.9 mg/dL (Using min of 1 mg/dL) at 2/75/7974  0:63 AM  Serum Albumin: 3.1 g/dL at 2/75/7974  0:63 AM  INR(ratio): 0.98 (Using min of 1) at 01/15/2024  3:02 PM  Age at listing (hypothetical): 58 years  Sex: Male at 01/17/2024  9:36 AM      Atrial flutter - Hx of persistent A FIb  Pt with hx of A fib, however EKG on admission c/w A flutter. Saw PCP outpatient, decided to restart A/C. Previously on coreg  but unable to tolerate iso hypotension as below.  Low heart rate, holding AV nodal blockers  -Repeat EKG  -Eliquis  5mg  BID stopped; heparin  gtt begun 7/24  -Hold coreg  6.25mg  daily  -K>4, Mg>2     ESRD on iHD  ESRD due to CNI toxicity and hypertension (biopsy 05/2019). Pt on TTS dialysis. Previous kidney transplant evaluation closed 09/2023 in the setting of elevated A1C, can be re-referred in six months. Plan for dialysis per patient's schedule. Of note- had VIR study of dialysis circuit that was planned for 7/22, cancelled given admitted to hospital.  Date of last dialysis 7/19.  -Nephrology consulted, no acute need for HD so will not access AVF  -BMP daily      T2DM  A1c on 7/21 10. Long-standing Type 2 Diabetes Mellitus, diagnosed 2015, post-liver transplant. Recently established with endocrine. Ultimate goal to optimize candidacy for potential renal/liver transplant in the future. Home regimen includes Tresiba  60U nightly, Novolog  25U before meals, Mounjaro  12.5 qWeek. Has CGM, however has trouble interpreting data.   - 10 units NPH morning and evening, 5 units lispro 3 times daily with meals  -Hypoglycemia protocol  -Hold home Mounjaro      HFpEF, NYHA class I  Pt with hx of HFpEF, currently not on any diuretics per chart review. ProBNP on admission elevated to 14,682. Last value documented is a BNP from 5/23, 603. No orthopnea, dyspnea on exertion. Not overtly volume overloaded at this time. Will plan for dialysis tomorrow.  Last echo May/2024.  Myocardial perfusion scan December/2024 with LVEF >60%   -Daily BMP  -Dialysis as above     HTN - Recent hypotension  Pt with hx of uncontrolled HTN on coreg , clonidine, hydralazine . However, presented to OSH with significant hypotension. All anti-hypertensives stopped at that time. On presentation, pt with labile BP measuring 130s-170s. Bp have since recovered.  -Restart amlodipine  10mg  daily  -Hold Coreg  6.25mg  daily      Chronic Problems  Hx DVT - Hold Eliquis  5mg  BID, replace with heparin  gtt  HLD- Pt prescribed atorvastatin  40mg  daily, however reportedly not taking. Held starting on 7/23 due to decompensated cirrhosis.   Hx of OSA- No longer uses CPAP after losing 40 lbs.  CAD- NM spect in 2024 with moderate sized, mild in severity, fixed perfusion defect mid anterior, apical anterior and apical segments. No CP on admission.      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      Medical Decision Making: Reviewed records from the following unique sources  EPIC, sister.      Daily Checklist:  Diet: Regular Diet  DVT PPx: Patient Already on Full Anticoagulation with eliquis  5mg  BID  Electrolytes: No Repletion Needed  Code Status: Full Code  Dispo:  Patient appropriate for Observation based on expectation at time of admission that period of observation will last less than two midnights    Team Contact Information:   Primary Team: Internal Medicine (MEDW)  Primary Resident: Artist Ill, MD  M3: Slater LELON Daring  Resident's Pager: [phone] (Gen MedW Intern - [place]olee)    Interval History:   Patient in pain from AVF site on exam, asking for medications. Oriented x3 but disoriented somewhat. Priority complaint is pain on RUE where dialysis is located. Asterixis is also appreciated.     ROS: deferred given patient's AMS.    Objective:   Temp:  [36.8 ??C (98.2 ??F)-37.3 ??C (99.1 ??F)] 36.8 ??C (98.2 ??F)  Pulse:  [55-73] 55  SpO2 Pulse:  [62-68] 62  Resp:  [16-29] 20  BP: (145-163)/(75-108) 157/80  SpO2:  [94 %-99 %] 97 %    Gen: NAD, Aox2   HENT: atraumatic, normocephalic, wound from liver bx line on left neck  Heart: RRR  Lungs: CTAB, no crackles or wheezes  Abdomen: soft, NTND, ascites  Extremities: No edema, warmth and more erythematous spot on dialysis fistula on RUE with tenderness to touch    Artist Ill, MD  PGY1 Internal Medicine

## 2024-01-17 NOTE — Unmapped (Signed)
 Nephrology ESKD Consult Note    Requesting provider: Garen Cone, MD  Service requesting consult: Med [facility] (MDW)  Reason for consult: Evaluation for dialysis needs    Outpatient dialysis unit:   [facility]  [address]  [place] [address]     Assessment/Recommendations: Richard [name] is a 58 y.o. male with a past medical history notable for ESKD on in-center hemodialysis TTS being evaluated at [facility] for confusion with concerns for hepatic encephalopathy. Patient missed Hemodialysis on 7/22.     # ESKD:   Outpatient HD Dialysis Rx and medications    Dialysis schedule: TTS   Time: 4  hrs Temp: 36.5C   Dialyzer: Optiflux 180 NRE Heparin : Does not receive any heparin    Dialysate Bath: 3 K, 2.5Ca, Na 137, Bicarbonate 35   EDW: 120.5 kg      - OP dialysis prescription reviewed and ordered  - Indication for acute dialysis?: No, will determine based on labs from 7/23.   - We will perform HD starting tomorrow  or Saturday based once access is obtained. and will otherwise attempt to continue pt's outpatient schedule if possible.   - Access: RUE AV fistula; Obtain PVL to evaluate AVF flow.  Please consult VIR for evaluation of vascular access. Painful RUE AVF, no bruit or thrill- concern for clot   - Hepatitis status: Hepatitis B surface antigen negative on 11/27/2023.    # Volume / Hypertension:  - Volume: Will attempt to achieve dry weight if tolerated  - Will adjust daily as needed    # Acid-Base / Electrolytes:  - Will evaluate acid-base status and electrolyte balance daily and adjust dialysate as needed.  Lab Results   Component Value Date    K 3.8 01/17/2024    CO2 22.0 01/17/2024       # Anemia of Chronic Kidney Disease:  - ESA: Patient not on ESA  Date of last ESA dose  n/a  - IV Iron/ PO oral iron: no   - Outpatient meds reviewed and ordered   Lab Results   Component Value Date    HGB 12.7 (L) 01/17/2024       # Secondary Hyperparathyroidism/Hyperphosphatemia:  - Phosphate binders : Renvela  1600 TID   - Activated Vitamin D Analogs: Patient not on any activated Vitamin D analogs , Calcitriol n/a  - Calcimimetics: 190 mg 3 x weekly   - Outpatient meds reviewed and ordered   Lab Results   Component Value Date    PHOS 4.4 01/17/2024    CALCIUM  9.0 01/17/2024       # Hepatic Encephalopathy, improving   #Cirrhosis of transplanted liver      - Evaluation and management per primary team  - No changes to management from a nephrology standpoint at this time    Richard DELENA Christ, DO  01/17/2024 6:50 PM   Medical decision-making for 01/17/24  Findings / Data     Patient has: []  acute illness w/systemic sxs  [mod]  []  two or more stable chronic illnesses [mod]  []  one chronic illness with acute exacerbation [mod]  []  acute complicated illness  [mod]  []  Undiagnosed new problem with uncertain prognosis  [mod] [x]  illness posing risk to life or bodily function (ex. AKI)  [high]  []  chronic illness with severe exacerbation/progression  [high]  []  chronic illness with severe side effects of treatment  [high] ESKD on RRT Probs At least 2:  Probs, Data, Risk   I reviewed: []   primary team note  []  consultant note(s)  [x]  external records [x]  chemistry results  [x]  CBC results  []  blood gas results  []  Other []  procedure/op note(s)   []  radiology report(s)  []  micro result(s)  []  w/ independent historian(s) Outside dialysis prescription reviewed , K and Hb addressed above >=3 Data Review (2 of 3)    I independently interpreted: []  Urine Sediment  []  Renal US  [x]  CXR Images  []  CT Images  []  Other []  EKG Tracing CXR with mild cephalization, low lung volumes  Any     I discussed: []  Pathology results w/ QHPs(s) from other specialties  []  Procedural findings w/ QHPs(s) from other specialties []  Imaging w/ QHP(s) from other specialties  [x]  Treatment plan w/ QHP(s) from other specialties Plan discussed with primary team Any     Mgm't requires: []  Prescription drug(s)  [mod]  []  Kidney biopsy [mod]  []  Central line placement  [mod] []  High risk medication use and/or intensive toxicity monitoring [high]  [x]  Renal replacement therapy [high]  []  High risk kidney biopsy  [high]  []  Escalation of care  [high]  []  High risk central line placement  [high] RRT: High risk of complications from RRT requiring intensive monitoring Risk        _____________________________________________________________________________________  Interval history:Patient less encephalopathic today however very anxious with LUE pain. He is anxious about missing HD.        History of Present Illness: Richard [name] is a 58 y.o. male with ESKD on dialysis as well as cirrhosis of transplanted liver  being evaluated at [facility] for worsening confusion who is seen in consultation at the request of Garen Cone, MD and Med [facility] (MDW). Nephrology has been consulted for evaluation of dialysis needs in the setting of end-stage kidney disease. Of note patient was confused, having less bowel movements than usual. He was recently admitted about a month ago for similar symptoms. Of note patient states that his fistula is  more painful, there was a circuit study planned for 7/22 but was cancelled. On evaluation patient is pleasantly confused. Upon evaluation Ammonia level is 150. No acute indications to proceed for dialysis.         INPATIENT MEDICATIONS:Current Medications[1]    OUTPATIENT MEDICATIONS:  Prior to Admission medications   Medication Dose, Route, Frequency   blood-glucose transmitter (DEXCOM G6 TRANSMITTER) Devi 1 each, Miscellaneous, Every 3 months, [facility]   amlodipine  (NORVASC ) 10 MG tablet 10 mg, Oral, Daily (standard)   apixaban  (ELIQUIS ) 5 mg Tab 5 mg, Oral, 2 times a day (standard)   atorvastatin  (LIPITOR ) 40 MG tablet 40 mg, Oral, Daily (standard)   blood-glucose meter,continuous (DEXCOM G6 RECEIVER) Misc 1 each, Miscellaneous, Daily, Dispense 1 receiver annually.  Sent to [facility]   blood-glucose sensor (DEXCOM G7 SENSOR) Devi Use 1 sensor every 10 days.   blood-glucose transmitter (DEXCOM G6 TRANSMITTER) Devi Use as directed to monitor blood glucose   carvedilol  (COREG ) 6.25 MG tablet 6.25 mg, Oral, 2 times a day (standard), Patient unsure about dosage   CHOLECALCIFEROL, VITAMIN D3, (VITAMIN D3 ORAL) 2,000 Units, Daily (standard)   gabapentin  (NEURONTIN ) 300 MG capsule 300 mg, Oral, Nightly   insulin  aspart (NOVOLOG  FLEXPEN U-100 INSULIN ) 100 unit/mL (3 mL) injection pen 35 Units, Subcutaneous, 3 times a day (AC)   insulin  degludec (TRESIBA  FLEXTOUCH U-200) 200 unit/mL (3 mL) InPn 60 Units, Subcutaneous, Daily (standard), Max dose of 100 units per day.   lactulose  10 gram/15 mL solution 30  g, 2 times a day (standard)   lanthanum  (FOSRENOL ) 1000 MG chewable tablet 1,000 mg, Oral, 3 times a day (with meals)   multivitamin (TAB-A-VITE/THERAGRAN) per tablet 1 tablet, Daily (standard)   mycophenolate  (CELLCEPT ) 250 mg capsule 250 mg, Oral, 2 times a day (standard)   pen needle, diabetic (TRUEPLUS PEN NEEDLE) 32 gauge x 5/32 (4 mm) Ndle Use with insulin  up to 4 times a day as needed.   pen needle, diabetic 32 gauge x 5/32 (4 mm) Ndle Use with insulin  up to 4 times/day as needed.   polyethylene glycol (GOLYTELY ) 236-22.74-6.74 gram solution Take by mouth as directed per [facility] GI prep instructions, for split bowel prep.   rifAXIMin  (XIFAXAN ) 550 mg Tab 550 mg, Oral, 2 times a day (standard)   tacrolimus  (PROGRAF ) 0.5 MG capsule 1 mg, Oral, 2 times a day   tirzepatide  (MOUNJARO ) 12.5 mg/0.5 mL PnIj 12.5 mg, Subcutaneous, Every 7 days   tirzepatide  (MOUNJARO ) 15 mg/0.5 mL PnIj 15 mg, Subcutaneous, Every 7 days   tirzepatide  (MOUNJARO ) 7.5 mg/0.5 mL PnIj 7.5 mg, Subcutaneous, Every 7 days        ALLERGIES  Patient has no known allergies.    MEDICAL HISTORY  Past Medical History[2]    SOCIAL HISTORY  Social History     Social History Narrative    From [place]    He was in the [employer].    Now lives in [place]    He works in Engineer, petroleum        Married    1 child (age 56.5)      reports that he has never smoked. He has been exposed to tobacco smoke. He has never used smokeless tobacco. He reports that he does not drink alcohol and does not use drugs.     FAMILY HISTORY  Family History[3]     Physical Exam:  Vitals:    01/17/24 1709   BP: 174/100   Pulse: 61   Resp: 22   Temp: 36.3 ??C (97.3 ??F)   SpO2: 100%     No intake/output data recorded.    Intake/Output Summary (Last 24 hours) at 01/17/2024 1850  Last data filed at 01/16/2024 2100  Gross per 24 hour   Intake 240 ml   Output --   Net 240 ml       General: well-appearing, no acute distress  Heart: RRR, no m/r/g  Lungs: CTAB, normal wob  Abd: soft, non-tender, non-distended. Mild asterixis  Ext: trace  edema  Access: RUE AV fistula  without thrill or bruit   Neuro Confused        ;         [1]   Current Facility-Administered Medications:     acetaminophen  (TYLENOL ) tablet 650 mg, Oral, Q8H SCH    amlodipine  (NORVASC ) tablet 10 mg, Oral, Daily    [Provider Hold] apixaban  (ELIQUIS ) tablet 5 mg, Oral, BID    [Provider Hold] atorvastatin  (LIPITOR ) tablet 40 mg, Oral, Daily    dextrose  50 % in water (D50W) 50 % solution 12.5 g, Intravenous, Q15 Min PRN    glucagon  injection 1 mg, Intramuscular, Once PRN    glucose chewable tablet 16 g, Oral, Q10 Min PRN    heparin  (porcine) 1000 unit/mL injection 5,000 Units, Intravenous, Q6H PRN    heparin  25,000 Units/250 mL (100 units/mL) in 0.45% saline infusion (premade), Intravenous, Continuous    HYDROmorphone  (PF) (DILAUDID ) injection 1 mg, Intravenous, Once PRN    insulin  lispro (HumaLOG ) inj  PERCENTAGE MEAL EATEN 5 Units, Subcutaneous, 3xd Meals    insulin  lispro (HumaLOG ) injection CORRECTIONAL 0-20 Units, Subcutaneous, ACHS    insulin  NPH (HumuLIN ,NovoLIN ) injection 10 Units, Subcutaneous, Q12H SCH    lactulose  (CEPHULAC ) packet 20 g, Oral, TID    lidocaine  (ASPERCREME) 4 % 1 patch, Transdermal, Daily    mycophenolate  (CELLCEPT ) capsule 250 mg, Oral, BID    oxyCODONE  (ROXICODONE ) immediate release tablet 5 mg, Oral, Q4H PRN    rifAXIMin  (XIFAXAN ) tablet 550 mg, Oral, BID    senna (SENOKOT) tablet 2 tablet, Oral, Nightly    tacrolimus  (PROGRAF ) capsule 1 mg, Oral, BID    ursodiol  (ACTIGALL ) capsule 600 mg, Oral, BID  [2]   Past Medical History:  Diagnosis Date    CHF (congestive heart failure)        COVID-19 07/18/2019    Diabetes mellitus        Family history of malignant neoplasm of prostate 06/23/2015    Heart disease     Heart murmur     Hepatic cirrhosis    06/23/2015    Overview:  Secondary to NASH; followed by Dr. Lindaann, GI.  Last Assessment & Plan:  Relevant Hx: Course: Daily Update: Today's Plan:     Hypertension     Hypogonadism in male 02/12/2014    Kidney stone     Liver cirrhosis secondary to NASH (nonalcoholic steatohepatitis)        s/p liver transplant 2013    Obesity    [3]   Family History  Problem Relation Age of Onset    Diabetes Father     Diabetes Paternal Grandfather     Liver disease Maternal Uncle     Cancer Maternal Grandmother     Melanoma Neg Hx     Basal cell carcinoma Neg Hx     Squamous cell carcinoma Neg Hx     Kidney disease Neg Hx

## 2024-01-17 NOTE — Unmapped (Signed)
 Patient is seen in consultation at the request of Garen Cone, MD with [facility] ([facility]).    Assessment & Recommendations     Richard [name] is a 58 y.o. 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     VIR consulted for a dialysis circuit study in the setting of right upper extremity brachiocephalic fistula with no thrill or bruit detected. Bedside US  performed, identifying possible clot.     AVF was last evaluated by VIR on 5/14 with no intervention warranted. Due for dialysis today 7/24, Cr 13.14, K 4.2, Mg 2.4.     Please obtain PVL of RUE brachiocephalic fistula. VIR will await final PVL results before providing recommendations. Please page the consult pager when completed for imaging review. If urgent HD needs, recommend placing temporary HD line.     This plan was discussed with Dr. Carlin Moles.    Thank you for involving us  in the care of this patient. Please page the VIR consult pager [phone]) with further questions, concerns, or if new issues arise.     Subjective     History of Present Illness:  Richard [name] is a 58 y.o. 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     VIR consulted for a dialysis circuit study in the setting of right upper extremity brachiocephalic fistula with no thrill or bruit detected. Bedside US  performed, identifying possible clot. Pending PVL.     AVF was last evaluated by VIR on 5/14 with no intervention warranted. Due for dialysis today, Cr 13.14, K 4.2, Mg 2.4.     Blood cx pending, drawn 7/23 @ 0600. On transplant ppx abx.     Eliquis  on hold, last dose 7/23 @ 2100. On heparin  gtt.     Review of Systems: Pertinent items are noted in HPI.    Past Medical History:  Past Medical History[1]    Past Surgical History:  Past Surgical History[2]    Allergies:  Allergies[3]     Objective     Physical Exam:  Vitals:    01/17/24 0814   BP: 157/80   Pulse: 55   Resp:    Temp: 36.8 ??C (98.2 ??F)   SpO2: 97%     Pertinent Labs:  Recent Labs     01/15/24  1502 01/16/24  1205 01/17/24  0448   WBC 5.5 5.5 4.9   HGB 13.2 12.5* 12.7*   HCT 39.8 38.2* 39.9   PLT 201 197 185       Recent Labs     01/15/24  1502 01/16/24  1205   NA 136 135   K 4.8 4.2   CL 100 102   BUN 36* 43*   CREATININE 11.51* 13.17*   GLU 303* 218*   Estimated Creatinine Clearance: 8 mL/min (A) (based on SCr of 13.17 mg/dL (H)).     Recent Labs     01/15/24  1502 01/17/24  0936   INR 0.98  --    APTT 28.3 32.3       Recent Labs     01/15/24  1502 01/16/24  1205   PROT 6.8  6.4   ALBUMIN 3.3* 3.1*   AST 128* 115*   ALT 105* 142*   ALKPHOS 391* 500*   BILITOT 0.9 1.4*       Lab Results   Component Value Date    LABBLOO No Growth at 24 hours 01/16/2024    LABBLOO No Growth at 24 hours 01/16/2024    LABBLOO No Growth at 5 days 01/18/2023    LABBLOO No Growth at 5 days 01/18/2023       Pertinent Imaging: No relevant imaging was available for review.           [1]   Past Medical History:  Diagnosis Date    CHF (congestive heart failure)        COVID-19 07/18/2019    Diabetes mellitus        Family history of malignant neoplasm of prostate 06/23/2015    Heart disease     Heart murmur     Hepatic cirrhosis    06/23/2015    Overview:  Secondary to NASH; followed by Dr. Lindaann, GI.  Last Assessment & Plan:  Relevant Hx: Course: Daily Update: Today's Plan:     Hypertension     Hypogonadism in male 02/12/2014    Kidney stone     Liver cirrhosis secondary to NASH (nonalcoholic steatohepatitis)        s/p liver transplant 2013    Obesity    [2]   Past Surgical History:  Procedure Laterality Date    CHOLECYSTECTOMY      liver transpant LIVER TRANSPLANTATION  06/27/2011    PR AV FIST REVISE GRFT,W THROMBECTOMY Right 01/15/2023    Procedure: REVISION, ARTERIOVENOUS FISTULA W/ THROMBECTOMY, AUTOGENOUS OR NONAUTOGENOUS DIALYSIS GRAFT (SEP. PROC), LOWER EXTREMITY;  Surgeon: Marchelle Kitchens, MD;  Location: [facility];  Service: General Surgery    PR COLONOSCOPY W/BIOPSY SINGLE/MULTIPLE N/A 08/13/2018    Procedure: COLONOSCOPY, FLEXIBLE, PROXIMAL TO SPLENIC FLEXURE; WITH BIOPSY, SINGLE OR MULTIPLE;  Surgeon: Thedora Alm Plain, MD;  [facility];  Service: Gastroenterology    PR COLSC FLX W/RMVL OF TUMOR POLYP LESION SNARE TQ N/A 08/13/2018    Procedure: COLONOSCOPY FLEX; W/REMOV TUMOR/LES BY SNARE;  Surgeon: Thedora Alm Plain, MD;  [facility]: [facility];  Service: Gastroenterology    PR CREAT AV FISTULA,NON-AUTOGENOUS GRAFT Right 11/29/2022    Procedure: CREATE AV FISTULA (SEPARATE PROC); NONAUTOGENOUS GRAFT (EG, BIOLOGICAL COLLAGEN, THERMOPLASTIC GRAFT), UPPER EXTREMITY;  Surgeon: Marchelle Kitchens, MD;  Location: [facility] OR [facility];  Service: General Surgery    PR UPPER GI ENDOSCOPY,BIOPSY N/A 07/13/2015    Procedure: UGI ENDOSCOPY; WITH BIOPSY, SINGLE OR MULTIPLE;  Surgeon: Elspeth Jerilynn Reek, MD;  Location: GI [facility];  Service: Gastroenterology    PR UPPER GI ENDOSCOPY,BIOPSY N/A 02/13/2023    Procedure: UGI ENDOSCOPY; WITH BIOPSY, SINGLE OR MULTIPLE;  Surgeon: Minnie Krystal Claude, MD;  Location: [facility];  Service: Gastroenterology   [3] No Known Allergies

## 2024-01-17 NOTE — Unmapped (Signed)
 Shift Summary  OxyCODONE  was administered at 3:10 PM for pain management, but pain levels remained unchanged.    Blood pressure increased from 157/80 in the morning to 174/100 by the end of the shift.    The patient consistently declined acetaminophen  for pain relief.    Last aPTT levels was 0.4. Heparin  gtts is not changed. New lab to be drawn in the morning.    Overall, the patient maintained stable cognitive function and experienced no falls during the shift.     Optimal Pain Control and Function: Pain in the right arm remained constant at a level of 10 throughout the shift, despite the administration of oxyCODONE  at 3:10 PM. The patient declined other pain interventions.     Absence of Fall and Fall-Related Injury: No falls or fall-related injuries occurred during the shift. Fall prevention measures, including the use of a fall armband and non-skid footwear, were consistently in place.     Optimal Cognitive Function: Cognitive function remained stable with appropriate judgment, safety awareness, and attention throughout the shift.   Problem: Wound  Goal: Optimal Pain Control and Function  Outcome: Shift Focus  Goal: Optimal Wound Healing  Outcome: Shift Focus     Problem: Fall Injury Risk  Goal: Absence of Fall and Fall-Related Injury  Outcome: Shift Focus  Intervention: Promote Injury-Free Environment  Recent Flowsheet Documentation  Taken 01/17/2024 1600 by Sharyle Peacemaker, RN  Safety Interventions: low bed  Taken 01/17/2024 1402 by Sharyle Peacemaker, RN  Safety Interventions: low bed     Problem: Pain Acute  Goal: Optimal Pain Control and Function  Outcome: Shift Focus     Problem: Confusion Acute  Goal: Optimal Cognitive Function  Outcome: Shift Focus     Problem: Adult Inpatient Plan of Care  Goal: Absence of Hospital-Acquired Illness or Injury  Intervention: Identify and Manage Fall Risk  Recent Flowsheet Documentation  Taken 01/17/2024 1600 by Sharyle Peacemaker, RN  Safety Interventions: low bed  Taken 01/17/2024 1402 by Kailena Lubas, RN  Safety Interventions: low bed  Intervention: Prevent and Manage VTE (Venous Thromboembolism) Risk  Recent Flowsheet Documentation  Taken 01/17/2024 1600 by Lizzy Hamre, RN  Anti-Embolism Device Status: Other (Comment)  Taken 01/17/2024 1402 by Javontae Marlette, RN  Anti-Embolism Device Status: Other (Comment)

## 2024-01-17 NOTE — Unmapped (Unsigned)
 Chief concern/reason for visit: {CC/Dx Smartlinks:21093521}    Assessment/Plan:     {A/P Smartlinks:21093127}    No follow-ups on file.      Subjective     HPI:  ***       Objective     There were no vitals taken for this visit.    Physical Exam:  {Physical Exam Smartlinks:21093508}    {Labs/Imaging/Pathology (Optional):21094520}    {MDM Documentation Elements (Optional) :60630160}    {Time Attest / VideoTeleDisclaimer / Interpreter (Optional):21094932}

## 2024-01-18 LAB — COMPREHENSIVE METABOLIC PANEL
ALBUMIN: 3.4 g/dL (ref 3.4–5.0)
ALKALINE PHOSPHATASE: 509 U/L — ABNORMAL HIGH (ref 46–116)
ALT (SGPT): 81 U/L — ABNORMAL HIGH (ref 10–49)
ANION GAP: 13 mmol/L (ref 5–14)
AST (SGOT): 41 U/L — ABNORMAL HIGH (ref <=34)
BILIRUBIN TOTAL: 1.1 mg/dL (ref 0.3–1.2)
BLOOD UREA NITROGEN: 54 mg/dL — ABNORMAL HIGH (ref 9–23)
BUN / CREAT RATIO: 3
CALCIUM: 9 mg/dL (ref 8.7–10.4)
CHLORIDE: 101 mmol/L (ref 98–107)
CO2: 21 mmol/L (ref 20.0–31.0)
CREATININE: 15.56 mg/dL — ABNORMAL HIGH (ref 0.73–1.18)
EGFR CKD-EPI (2021) MALE: 3 mL/min/1.73m2 — ABNORMAL LOW (ref >=60)
GLUCOSE RANDOM: 290 mg/dL — ABNORMAL HIGH (ref 70–179)
POTASSIUM: 3.7 mmol/L (ref 3.4–4.8)
PROTEIN TOTAL: 7 g/dL (ref 5.7–8.2)
SODIUM: 135 mmol/L (ref 135–145)

## 2024-01-18 LAB — CBC
HEMATOCRIT: 38.8 % — ABNORMAL LOW (ref 39.0–48.0)
HEMOGLOBIN: 12.6 g/dL — ABNORMAL LOW (ref 12.9–16.5)
MEAN CORPUSCULAR HEMOGLOBIN CONC: 32.6 g/dL (ref 32.0–36.0)
MEAN CORPUSCULAR HEMOGLOBIN: 28.7 pg (ref 25.9–32.4)
MEAN CORPUSCULAR VOLUME: 88 fL (ref 77.6–95.7)
MEAN PLATELET VOLUME: 8.9 fL (ref 6.8–10.7)
PLATELET COUNT: 204 10*9/L (ref 150–450)
RED BLOOD CELL COUNT: 4.4 10*12/L (ref 4.26–5.60)
RED CELL DISTRIBUTION WIDTH: 13.9 % (ref 12.2–15.2)
WBC ADJUSTED: 6.6 10*9/L (ref 3.6–11.2)

## 2024-01-18 LAB — PHOSPHORUS: PHOSPHORUS: 4.9 mg/dL (ref 2.4–5.1)

## 2024-01-18 LAB — TACROLIMUS LEVEL, TROUGH: TACROLIMUS, TROUGH: 7 ng/mL (ref 5.0–15.0)

## 2024-01-18 LAB — APTT
APTT: 123.5 s — ABNORMAL HIGH (ref 24.8–38.4)
HEPARIN CORRELATION: 0.7

## 2024-01-18 LAB — MAGNESIUM: MAGNESIUM: 2.6 mg/dL (ref 1.6–2.6)

## 2024-01-18 MED ADMIN — midazolam (VERSED) injection: INTRAVENOUS | @ 18:00:00 | Stop: 2024-01-18

## 2024-01-18 MED ADMIN — lidocaine (PF) (XYLOCAINE-MPF) 20 mg/mL (2 %) injection: INTRAVENOUS | @ 18:00:00 | Stop: 2024-01-18

## 2024-01-18 MED ADMIN — lidocaine (PF) (XYLOCAINE-MPF) 10 mg/mL (1 %) injection: INTRADERMAL | @ 20:00:00 | Stop: 2024-01-18

## 2024-01-18 MED ADMIN — amlodipine (NORVASC) tablet 10 mg: 10 mg | ORAL | @ 12:00:00

## 2024-01-18 MED ADMIN — oxyCODONE (ROXICODONE) immediate release tablet 5 mg: 5 mg | ORAL | @ 01:00:00 | Stop: 2024-01-31

## 2024-01-18 MED ADMIN — insulin NPH (HumuLIN,NovoLIN) injection 10 Units: 10 [IU] | SUBCUTANEOUS | @ 12:00:00 | Stop: 2024-01-18

## 2024-01-18 MED ADMIN — mycophenolate (CELLCEPT) capsule 250 mg: 250 mg | ORAL | @ 12:00:00

## 2024-01-18 MED ADMIN — electrolyte-A (PLASMA-LYT A) infusion: INTRAVENOUS | @ 18:00:00 | Stop: 2024-01-18

## 2024-01-18 MED ADMIN — Propofol (DIPRIVAN) injection: INTRAVENOUS | @ 18:00:00 | Stop: 2024-01-18

## 2024-01-18 MED ADMIN — hydrALAZINE (APRESOLINE) injection 10 mg: 10 mg | INTRAVENOUS | @ 22:00:00 | Stop: 2024-01-18

## 2024-01-18 MED ADMIN — HYDROmorphone (PF) (DILAUDID) injection 1 mg: 1 mg | INTRAVENOUS | @ 02:00:00 | Stop: 2024-01-17

## 2024-01-18 MED ADMIN — fentaNYL (PF) (SUBLIMAZE) injection: INTRAVENOUS | @ 20:00:00 | Stop: 2024-01-18

## 2024-01-18 MED ADMIN — insulin lispro (HumaLOG) injection CORRECTIONAL 0-20 Units: 0-20 [IU] | SUBCUTANEOUS | @ 13:00:00

## 2024-01-18 MED ADMIN — insulin lispro (HumaLOG) injection CORRECTIONAL 0-20 Units: 0-20 [IU] | SUBCUTANEOUS | @ 02:00:00

## 2024-01-18 MED ADMIN — heparin 25,000 Units/250 mL (100 units/mL) in 0.45% saline infusion (premade): 0-24 [IU]/kg/h | INTRAVENOUS

## 2024-01-18 MED ADMIN — rifAXIMin (XIFAXAN) tablet 550 mg: 550 mg | ORAL | @ 12:00:00 | Stop: 2024-01-30

## 2024-01-18 MED ADMIN — ceFAZolin (ANCEF) injection: INTRAVENOUS | @ 20:00:00 | Stop: 2024-01-18

## 2024-01-18 MED ADMIN — fentaNYL (PF) (SUBLIMAZE) injection 25 mcg: 25 ug | INTRAVENOUS | @ 23:00:00 | Stop: 2024-01-18

## 2024-01-18 MED ADMIN — alteplase (ACTIVase) injection small catheter clearance: INTRA_ARTERIAL | @ 20:00:00 | Stop: 2024-01-18

## 2024-01-18 MED ADMIN — iohexol (OMNIPAQUE) 300 mg iodine/mL solution 50 mL: 50 mL | INTRA_ARTERIAL | @ 21:00:00 | Stop: 2024-01-18

## 2024-01-18 MED ADMIN — fentaNYL (PF) (SUBLIMAZE) injection: INTRAVENOUS | @ 21:00:00 | Stop: 2024-01-18

## 2024-01-18 MED ADMIN — heparin 25,000 Units/250 mL (100 units/mL) in 0.45% saline infusion (premade): 0-24 [IU]/kg/h | INTRAVENOUS | @ 04:00:00

## 2024-01-18 MED ADMIN — rifAXIMin (XIFAXAN) tablet 550 mg: 550 mg | ORAL | @ 01:00:00 | Stop: 2024-01-30

## 2024-01-18 MED ADMIN — heparin (porcine) 1000 unit/mL injection: INTRAVENOUS | @ 21:00:00 | Stop: 2024-01-18

## 2024-01-18 MED ADMIN — insulin NPH (HumuLIN,NovoLIN) injection 10 Units: 10 [IU] | SUBCUTANEOUS | @ 01:00:00

## 2024-01-18 MED ADMIN — propofol (DIPRIVAN) infusion 10 mg/mL: INTRAVENOUS | @ 19:00:00 | Stop: 2024-01-18

## 2024-01-18 MED ADMIN — ursodiol (ACTIGALL) capsule 600 mg: 600 mg | ORAL | @ 12:00:00

## 2024-01-18 MED ADMIN — fentaNYL (PF) (SUBLIMAZE) injection: INTRAVENOUS | @ 19:00:00 | Stop: 2024-01-18

## 2024-01-18 MED ADMIN — tacrolimus (PROGRAF) capsule 1 mg: 1 mg | ORAL | @ 12:00:00

## 2024-01-18 MED ADMIN — lactulose (CEPHULAC) packet 20 g: 20 g | ORAL | @ 12:00:00

## 2024-01-18 MED ADMIN — ursodiol (ACTIGALL) capsule 600 mg: 600 mg | ORAL | @ 01:00:00

## 2024-01-18 MED ADMIN — HYDROmorphone (PF) (DILAUDID) injection 0.5 mg: .5 mg | INTRAVENOUS | @ 04:00:00 | Stop: 2024-01-18

## 2024-01-18 NOTE — Unmapped (Signed)
 Internal Medicine (MEDW) Progress Note    Assessment & Plan:   Richard [name] is a 58 y.o. 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     Principal Problem:    Hepatic encephalopathy     Active Problems:    Hypertension    Type 2 diabetes mellitus with hyperglycemia, with long-term current use of insulin        H/O gastroesophageal reflux (GERD)    Liver replaced by transplant       Obstructive sleep apnea syndrome    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)    (HFpEF) heart failure with preserved ejection fraction       CAD (coronary artery disease)    Cirrhosis of transplanted liver         Active Problems  AMS likely 2/2 to HE  Pt presented with one day of gait disturbance and AMS. Pt with recent hospitalization (6/24-6/29) for same presentation where CT head and EEG were negative. AMS thought to be 2/2 to HE. Previously, pt not on lactulose  or Xifaxin. Started on lactulose  and rifaxamin since then, however per sister had decreased Bms in the last few days. With significant asterixis on exam. Will r/o other causes of AMS with infectious w/up. Patient had 9 BM On 7/24. Current regimen of lactulose  and Xifaxin appropriate at this time. Will continue to uptitrate lactulose  as needed.  Patient improving with increased bowel movements. No fluid pocket available for paracentesis.  -Hold gabapentin  pending improvement in mental status  -Continue lactulose  20g TID, titrate to 3-5 Bms daily  -Continue Xifaxin 550mg  BID  -Delirium precautions   -Bcx NGTD, UA pending  -Ursodiol  600 mg twice daily    AV Fistual Clot, RUE  Patient endorsed extreme painfulness of RUE at the location of his fistula. Fistula previously evaluated for stenosis on 5/14 (ruled it OK). Reports of recirculation but no evidence of decreased flows or prolonged bleeding and fistula with thrill, pulsatility, and collapsibility at the time. No thrill or bruit detected on exams since admission. PVL preformed finding no evidence of obstruction in the central veins. Evidence of acute obstruction visualized in the brachiocephalic AVF. All other venous findings in the upper extremity are within normal limits. VIR attempted to remove clot though unsuccessful, HD line placed.   -Heparin  drip, Eliquis  held  - 5 mg oxycodone  every 4 hours as needed  -Schedule tylenol  650mg  every 8 hours  -Lidocaine  patch  -s/p HD line     Decompensated Cirrhosis vs steatosis of transplanted liver  - Transaminitis  Patient with [name] cirrhosis s/p transplant (02/2012). Unfortunately, now with cirrhosis of transplanted liver. Now with second admission for altered mental status likely 2/2 to HE as above. S/p recent biopsy iso transaminitis that was negative for cell mediated rejection. Pathophysiology of HE is likely due to constipation following recent med change to rifaximin  from lactulose  (7/18) and opioid use (for pain PRN following liver biopsy on 6/30). AST 128, ALT 105, Alkphos 391 on admission. Ursodiol  started 7/24 per GI recommendation given positive AMA and elevate ALP, although no findings on biopsy to suggest PBC.  Will pursue MRI abdomen with elastography given biopsy did not suggest advanced cirrhosis although clinical suspicion still elevated  -Ursodiol  started 7/24  -MRI abdomen elastography ordered  -Variceal ppx - home  amlodipine  10mg  daily and carvedilol  6.25mg  BID restarted  -Continue cellcept  250mg  BID  -Tacrolimus  1mg  BID  -7/23 tac trough within goal range, continue on current regimen    MELD 3.0: 18 at 01/17/2024  9:36 AM  MELD-Na: 20 at 01/17/2024  9:36 AM  Calculated from:  Serum Creatinine: On dialysis. Using the maximum value.  Serum Sodium: 139 mmol/L (Using max of 137 mmol/L) at 01/17/2024  9:36 AM  Total Bilirubin: 0.9 mg/dL (Using min of 1 mg/dL) at 2/75/7974  0:63 AM  Serum Albumin: 3.1 g/dL at 2/75/7974  0:63 AM  INR(ratio): 0.98 (Using min of 1) at 01/15/2024  3:02 PM  Age at listing (hypothetical): 58 years  Sex: Male at 01/17/2024  9:36 AM    Atrial flutter - Hx of persistent A FIb  Pt with hx of A fib, however EKG on admission c/w A flutter. Saw PCP outpatient, decided to restart A/C. Previously on coreg  but unable to tolerate iso hypotension as below.  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 Pt on TTS dialysis. Previous kidney transplant evaluation closed 09/2023 in the setting of elevated A1C, can be re-referred in six months. Plan for dialysis per patient's schedule. Of note- had VIR study of dialysis circuit that was planned for 7/22, cancelled given admitted to hospital.  Date of last dialysis 7/19.   -Nephrology consulted, no acute need for HD so will not access AVF; plan for dialysis on 7.26  -BMP daily      T2DM  A1c on 7/21 10. Long-standing Type 2 Diabetes Mellitus, diagnosed 2015, post-liver transplant. Recently established with endocrine. Ultimate goal to optimize candidacy for potential renal/liver transplant in the future. Home regimen includes Tresiba  60U nightly, Novolog  25U before meals, Mounjaro  12.5 qWeek. Has CGM, however has trouble interpreting data. Glucose was elevated at 290 after starting 10 units NPH morning/night and 5 units lispro TID with meals. Changed insulin  regimen to the following:  - 14 units NPH morning and evening, 5 units lispro 3 times daily with meals  -Hypoglycemia protocol  -Hold home Mounjaro      HFpEF, NYHA class I  Pt with hx of HFpEF, currently not on any diuretics per chart review. ProBNP on admission elevated to 14,682. Last value documented is a BNP from 5/23, 603. No orthopnea, dyspnea on exertion. Not overtly volume overloaded at this time. Will plan for dialysis Saturday 7/26 with line access if AVF not available. No indications for emergent dialysis as of now. .  Last echo May/2024.  Myocardial perfusion scan December/2024 with LVEF >60%   -Daily BMP  -Dialysis as above     HTN - Recent hypotension  Pt with hx of uncontrolled HTN on coreg , clonidine, hydralazine . However, presented to OSH with significant hypotension. All anti-hypertensives stopped at that time. On presentation, pt with labile BP measuring 130s-170s. Bp have since recovered and have been consistently elevated. Patient has been restarted on home amlodipine  and coreg .   -Continue amlodipine  10mg  daily  -Restart Coreg  6.25mg  BID daily    Chronic Problems  Hx DVT - Hold Eliquis  5mg  BID, replace with heparin  gtt  HLD- Pt prescribed atorvastatin  40mg  daily, however reportedly not taking. Held starting on 7/23 due to decompensated cirrhosis.   Hx of OSA- No longer uses CPAP after losing  40 lbs.  CAD- NM spect in 2024 with moderate sized, mild in severity, fixed perfusion defect mid anterior, apical anterior and apical segments. No CP on admission.    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    Medical Decision Making: Reviewed records from the following unique sources  EPIC, sister.    Daily Checklist:  Diet: Regular Diet  DVT PPx: Patient Already on Full Anticoagulation with eliquis  5mg  BID  Electrolytes: No Repletion Needed  Code Status: Full Code  Dispo:  Patient appropriate for Observation based on expectation at time of admission that period of observation will last less than two midnights    Team Contact Information:   Primary Team: Internal Medicine (MEDW)  Primary Resident: Artist Ill, MD  M3: Slater LELON Daring  Resident's Pager: [phone] (Gen MedW Intern - [place])    Interval History:   Patient in pain from AVF site on exam. Has stopped taking PRN pain medications. Oriented x3 with confusion much resolved. Priority complaint remains pain on RUE where dialysis is located. Asterixis is also appreciated but improving.  Patient was found sitting in the shower. Denies fall. Explained he was trying to get his boxers on and had to sit down. No evidence of head trauma.    ROS: Denies headache, chest pain, shortness of breath, abdominal pain, nausea, vomiting.    Objective:   Temp:  [36.3 ??C (97.3 ??F)-37.4 ??C (99.3 ??F)] 37.1 ??C (98.8 ??F)  Pulse:  [61-77] 75  Resp:  [16-22] 16  BP: (151-176)/(89-100) 176/97  SpO2:  [96 %-100 %] 96 %    Gen: NAD, Aox3  HENT: atraumatic, normocephalic, wound from liver bx line on left neck  Heart: RRR  Lungs: CTAB, no crackles or wheezes  Abdomen: soft, NTND, ascites  Extremities: No edema, warmth and more erythematous spot on dialysis fistula on RUE with tenderness to touch    Slater Daring, MS3    I attest that I have reviewed note and that the components of the history of the present illness, the physical exam, and the assessment and plan documented were performed by me or were performed in my presence by the student and verified by me.    [name], MD

## 2024-01-18 NOTE — Unmapped (Addendum)
 Patient was in the bathroom taking a shower as he was not a high falls risk. After it had been a littler longer than I had told him he could take (due to needing to get back on heparin  gtt) I knocked and asked if he was alright. He stated yes, I am almost done. Just give me a few minutes. After a few minutes passed I knocked again, he stated I am almost coming out, give me one more minute I told him I would give him a few and he said No, I just need one minute.  I went back after two minutes and he said I will be right out Reena came with me and we opened the door together and found the patient sitting on the floor with the water still running. We immediately asked him if he was ok and if he had fallen. He looked up at us  and said No, I didn't fall. We asked him what happened and he said I sat down. He was clearly trying to adjust his boxers at the time. Reena and I helped him up off the floor first onto the toilet and then we were able to help him up. We continually asked him if he fell and if he was hurt. He said nothing hurts, I did not fall. He finally admitted I was trying to put my boxers on and my leg kept going through the wrong hole so I sat down. He said I was embarrassed I couldn't get back up. I asked him if he hit his head or any other body part when he sat down, he stated no, why would I have done that. I also asked if he had a headache, his response why would I have a headache I said just in case you did hit your head. He said I didn't fall, I didn't get hurt, I sat down so that I could put my boxers on and I couldn't get back up. I was embarrassed. Did an assessment and found no signs of injury. I told the patient that I was going to put the bed alarm on and he said no, don't do that. Why are you going to do that? I explained it was to make sure that he was safe, his response well you can turn it on but I am just going to get up and walk. I walk, and I am going to ignore the bed alarm and do it. I told him it makes me feel more comfortable so that when it goes off I can check on him to make sure that he is walking safely. He stated go ahead, but I am just going to ignore it. I did turn the bed alarm on. I informed the doctor of what happened and she agreed that he was a falls risk and needed to have the bed alarm on. Took a set of vitals which were wnl. Patient did get up and ended up sitting on the chair. I was unable to get the chair pad down before he sat down to set up the chair alarm. Patient continued to refuse bed alarm as well as chair alarm.    Shift Summary  Patient admitted for hepatic encephalopathy.  Vital signs remained stable on room air.  Bruising and redness were observed on the right upper extremity, indicating changes in skin condition.    Patient had 10/10 arm pain but declined any pain medication in fear of worsening his hepatic encephalopathy.   Overall, the patient  experienced persistent pain and changes in skin integrity, with infection prevention measures in place.     Absence of Infection Signs and Symptoms: Aseptic technique was maintained, and personal protective equipment was utilized throughout the shift. Rest and sleep were promoted, and the patient was provided with a single room.     Optimal Wound Healing: Bruising and redness were noted on the right upper extremity, and skin integrity was not within defined limits.      Optimal Pain Control and Function: Pain in the right arm remained acute, with a pain scale fluctuating between 7 and 10. The patient declined interventions for pain relief.

## 2024-01-18 NOTE — Unmapped (Signed)
 The patient was seen in the room awake sitting at the edge of the bed with ongoing Heparin  drip. He reports that he has had 6 bowel movements for today. He also report right arm pain and requested pain meds of Dilaudid . Informed patient that hs IV Dilaudid  was to be given prior to his PVL study. Offered Oxycodone  instead while raising concern with the provider. At 2200H patient was picked up by transport to undergo his PVL. Pre-procedure meds given as ordered. Provider went to the room of the patient and discussed his plan of care and made new orders. Around 0015H, new bag of heparin  drip was hanged. He was given 0.5mg  IV Dilaudid  as ordered. Vital signs taken and recorded. Blood sugar monitored and due insulin  given. Monitored accordingly. Needs attended. Patient will transfer to 7 General Medicine at room 7112. Report was given to the receiving nurse. Around 0300H, patient was picked by the transport. Plan of care ongoing.     Shift Summary  Pain management was a focus, with fluctuations in pain levels and administration of medications including HYDROmorphone .    Fall risk interventions were consistently applied, including regular toileting and hourly visual checks.    Skin integrity was maintained with appropriate positioning and pressure reduction techniques.    Temperature remained stable throughout the shift.    Overall, the patient's condition was managed with attention to pain control, fall prevention, and skin integrity.     Absence of Infection Signs and Symptoms: Temperature remained stable at 37.4 ??C throughout the shift, with no significant fluctuations noted.     Optimal Wound Healing: Skin assessments indicated no apparent problems with friction and shear, and positioning was adjusted to promote skin integrity.     Absence of Fall and Fall-Related Injury: Fall risk interventions were consistently applied, including hourly visual checks and regular toileting, with no falls reported.     Optimal Pain Control and Function: Pain in the right arm was acute and fluctuated, peaking at a score of 10 before decreasing to 9 after administration of HYDROmorphone  in the IMG PVL [facility].

## 2024-01-18 NOTE — Unmapped (Signed)
 Nephrology ESKD Consult Note    Requesting provider: Garen Cone, MD  Service requesting consult: Med [facility] Welt (MDW)  Reason for consult: Evaluation for dialysis needs    Outpatient dialysis unit:   [facility]  [address]  [place] [address]     Assessment/Recommendations: Richard [name] is a 58 y.o. male with a past medical history notable for ESKD on in-center hemodialysis TTS being evaluated at [facility] for confusion with concerns for hepatic encephalopathy. Patient missed Hemodialysis on 7/22.     # ESKD:   Outpatient HD Dialysis Rx and medications    Dialysis schedule: TTS   Time: 4  hrs Temp: 36.5C   Dialyzer: Optiflux 180 NRE Heparin : Does not receive any heparin    Dialysate Bath: 3 K, 2.5Ca, Na 137, Bicarbonate 35   EDW: 120.5 kg      - OP dialysis prescription reviewed and ordered  - Indication for acute dialysis?: No  - We will perform HD starting tomorrow  based once access is obtained. and will otherwise attempt to continue pt's outpatient schedule if possible.   - Access: RUE AV fistula; Obtain PVL to evaluate AVF flow.   - PVL oon 7/24 with obstruction in brachiocephalic vein    - Plan for VIR circuit study and declotting on 7.25    - Concern patient has been noncompliant with anticoagulation int he setting of hepatic encephalopathy, rather than treatment failure- would defer to Hematology for anticoagulation recommendations.   - Hepatitis status: Hepatitis B surface antigen negative on 11/27/2023.    # Volume / Hypertension:  - Volume: Will attempt to achieve dry weight if tolerated  - Will adjust daily as needed    # Acid-Base / Electrolytes:  - Will evaluate acid-base status and electrolyte balance daily and adjust dialysate as needed.  Lab Results   Component Value Date    K 3.7 01/18/2024    CO2 21.0 01/18/2024       # Anemia of Chronic Kidney Disease:  - ESA: Patient not on ESA  Date of last ESA dose  n/a  - IV Iron/ PO oral iron: no   - Outpatient meds reviewed and ordered   Lab Results   Component Value Date    HGB 12.6 (L) 01/18/2024       # Secondary Hyperparathyroidism/Hyperphosphatemia:  - Phosphate binders : Renvela  1600 TID   - Activated Vitamin D Analogs: Patient not on any activated Vitamin D analogs , Calcitriol n/a  - Calcimimetics: 190 mg 3 x weekly   - Outpatient meds reviewed and ordered   Lab Results   Component Value Date    PHOS 4.9 01/18/2024    CALCIUM  9.0 01/18/2024       # Hepatic Encephalopathy, improving   #Cirrhosis of transplanted liver      - Evaluation and management per primary team  - No changes to management from a nephrology standpoint at this time    Richard DELENA Christ, DO  01/18/2024 9:57 AM   Medical decision-making for 01/18/24  Findings / Data     Patient has: []  acute illness w/systemic sxs  [mod]  []  two or more stable chronic illnesses [mod]  []  one chronic illness with acute exacerbation [mod]  []  acute complicated illness  [mod]  []  Undiagnosed new problem with uncertain prognosis  [mod] [x]  illness posing risk to life or bodily function (ex. AKI)  [high]  []  chronic illness with severe exacerbation/progression  [high]  []  chronic illness  with severe side effects of treatment  [high] ESKD on RRT Probs At least 2:  Probs, Data, Risk   I reviewed: []  primary team note  []  consultant note(s)  [x]  external records [x]  chemistry results  [x]  CBC results  []  blood gas results  []  Other []  procedure/op note(s)   []  radiology report(s)  []  micro result(s)  []  w/ independent historian(s) Outside dialysis prescription reviewed , K and Hb addressed above >=3 Data Review (2 of 3)    I independently interpreted: []  Urine Sediment  []  Renal US  [x]  CXR Images  []  CT Images  []  Other []  EKG Tracing CXR with mild cephalization, low lung volumes  Any     I discussed: []  Pathology results w/ QHPs(s) from other specialties  []  Procedural findings w/ QHPs(s) from other specialties []  Imaging w/ QHP(s) from other specialties  [x]  Treatment plan w/ QHP(s) from other specialties Plan discussed with primary team Any     Mgm't requires: []  Prescription drug(s)  [mod]  []  Kidney biopsy  [mod]  []  Central line placement  [mod] []  High risk medication use and/or intensive toxicity monitoring [high]  [x]  Renal replacement therapy [high]  []  High risk kidney biopsy  [high]  []  Escalation of care  [high]  []  High risk central line placement  [high] RRT: High risk of complications from RRT requiring intensive monitoring Risk        _____________________________________________________________________________________  Interval history:Patient less encephalopathic today however very anxious with LUE pain. He was noted to have potentially fallen, had 9 BM overnight.     History of Present Illness: Richard [name] is a 58 y.o. male with ESKD on dialysis as well as cirrhosis of transplanted liver  being evaluated at [facility] for worsening confusion who is seen in consultation at the request of Garen Cone, MD and Med Diedre Cal (MDW). Nephrology has been consulted for evaluation of dialysis needs in the setting of end-stage kidney disease. Of note patient was confused, having less bowel movements than usual. He was recently admitted about a month ago for similar symptoms. Of note patient states that his fistula is  more painful, there was a circuit study planned for 7/22 but was cancelled. On evaluation patient is pleasantly confused. Upon evaluation Ammonia level is 150. No acute indications to proceed for dialysis.         INPATIENT MEDICATIONS:Current Medications[1]    OUTPATIENT MEDICATIONS:  Prior to Admission medications   Medication Dose, Route, Frequency   blood-glucose transmitter (DEXCOM G6 TRANSMITTER) Devi 1 each, Miscellaneous, Every 3 months, [facility]   amlodipine  (NORVASC ) 10 MG tablet 10 mg, Oral, Daily (standard)   apixaban  (ELIQUIS ) 5 mg Tab 5 mg, Oral, 2 times a day (standard)   atorvastatin  (LIPITOR ) 40 MG tablet 40 mg, Oral, Daily (standard) blood-glucose meter,continuous (DEXCOM G6 RECEIVER) Misc 1 each, Miscellaneous, Daily, Dispense 1 receiver annually.  Sent to [facility]   blood-glucose sensor (DEXCOM G7 SENSOR) Devi Use 1 sensor every 10 days.   blood-glucose transmitter (DEXCOM G6 TRANSMITTER) Devi Use as directed to monitor blood glucose   carvedilol  (COREG ) 6.25 MG tablet 6.25 mg, Oral, 2 times a day (standard), Patient unsure about dosage   CHOLECALCIFEROL, VITAMIN D3, (VITAMIN D3 ORAL) 2,000 Units, Daily (standard)   gabapentin  (NEURONTIN ) 300 MG capsule 300 mg, Oral, Nightly   insulin  aspart (NOVOLOG  FLEXPEN U-100 INSULIN ) 100 unit/mL (3 mL) injection pen 35 Units, Subcutaneous, 3 times a day (AC)   insulin  degludec (TRESIBA  FLEXTOUCH U-200)  200 unit/mL (3 mL) InPn 60 Units, Subcutaneous, Daily (standard), Max dose of 100 units per day.   lactulose  10 gram/15 mL solution 30 g, 2 times a day (standard)   lanthanum  (FOSRENOL ) 1000 MG chewable tablet 1,000 mg, Oral, 3 times a day (with meals)   multivitamin (TAB-A-VITE/THERAGRAN) per tablet 1 tablet, Daily (standard)   mycophenolate  (CELLCEPT ) 250 mg capsule 250 mg, Oral, 2 times a day (standard)   pen needle, diabetic (TRUEPLUS PEN NEEDLE) 32 gauge x 5/32 (4 mm) Ndle Use with insulin  up to 4 times a day as needed.   pen needle, diabetic 32 gauge x 5/32 (4 mm) Ndle Use with insulin  up to 4 times/day as needed.   polyethylene glycol (GOLYTELY ) 236-22.74-6.74 gram solution Take by mouth as directed per [facility] GI prep instructions, for split bowel prep.   rifAXIMin  (XIFAXAN ) 550 mg Tab 550 mg, Oral, 2 times a day (standard)   tacrolimus  (PROGRAF ) 0.5 MG capsule 1 mg, Oral, 2 times a day   tirzepatide  (MOUNJARO ) 12.5 mg/0.5 mL PnIj 12.5 mg, Subcutaneous, Every 7 days   tirzepatide  (MOUNJARO ) 15 mg/0.5 mL PnIj 15 mg, Subcutaneous, Every 7 days   tirzepatide  (MOUNJARO ) 7.5 mg/0.5 mL PnIj 7.5 mg, Subcutaneous, Every 7 days        ALLERGIES  Patient has no known allergies.    MEDICAL HISTORY  Past Medical History[2]    SOCIAL HISTORY  Social History     Social History Narrative    From [place]    He was in the [employer].    Now lives in [place]    He works in Engineer, petroleum        Married    1 child (age 13.5)      reports that he has never smoked. He has been exposed to tobacco smoke. He has never used smokeless tobacco. He reports that he does not drink alcohol and does not use drugs.     FAMILY HISTORY  Family History[3]     Physical Exam:  Vitals:    01/18/24 0515   BP: 176/97   Pulse: 75   Resp: 16   Temp: 37.1 ??C (98.8 ??F)   SpO2: 96%     No intake/output data recorded.    Intake/Output Summary (Last 24 hours) at 01/18/2024 0957  Last data filed at 01/17/2024 2000  Gross per 24 hour   Intake 360 ml   Output --   Net 360 ml       General: well-appearing, no acute distress  Heart: RRR, no m/r/g  Lungs: CTAB, normal wob  Abd: soft, non-tender, non-distended. Mild asterixis  Ext: trace  edema  Access: RUE AV fistula  without thrill or bruit   Neuro encephalopathy improving        ;         [1]   Current Facility-Administered Medications:     acetaminophen  (TYLENOL ) tablet 650 mg, Oral, Q8H SCH    amlodipine  (NORVASC ) tablet 10 mg, Oral, Daily    [Provider Hold] apixaban  (ELIQUIS ) tablet 5 mg, Oral, BID    [Provider Hold] atorvastatin  (LIPITOR ) tablet 40 mg, Oral, Daily    dextrose  50 % in water (D50W) 50 % solution 12.5 g, Intravenous, Q15 Min PRN    glucagon  injection 1 mg, Intramuscular, Once PRN    glucose chewable tablet 16 g, Oral, Q10 Min PRN    heparin  (porcine) 1000 unit/mL injection 5,000 Units, Intravenous, Q6H PRN    heparin  25,000 Units/250 mL (100 units/mL)  in 0.45% saline infusion (premade), Intravenous, Continuous    insulin  lispro (HumaLOG ) inj PERCENTAGE MEAL EATEN 5 Units, Subcutaneous, 3xd Meals    insulin  lispro (HumaLOG ) injection CORRECTIONAL 0-20 Units, Subcutaneous, ACHS    insulin  NPH (HumuLIN ,NovoLIN ) injection 10 Units, Subcutaneous, Q12H SCH    lactulose  (CEPHULAC ) packet 20 g, Oral, TID lidocaine  (ASPERCREME) 4 % 1 patch, Transdermal, Daily    mycophenolate  (CELLCEPT ) capsule 250 mg, Oral, BID    oxyCODONE  (ROXICODONE ) immediate release tablet 5 mg, Oral, Q4H PRN    rifAXIMin  (XIFAXAN ) tablet 550 mg, Oral, BID    senna (SENOKOT) tablet 2 tablet, Oral, Nightly    tacrolimus  (PROGRAF ) capsule 1 mg, Oral, BID    ursodiol  (ACTIGALL ) capsule 600 mg, Oral, BID  [2]   Past Medical History:  Diagnosis Date    CHF (congestive heart failure)        COVID-19 07/18/2019    Diabetes mellitus        Family history of malignant neoplasm of prostate 06/23/2015    Heart disease     Heart murmur     Hepatic cirrhosis    06/23/2015    Overview:  Secondary to NASH; followed by Dr. Lindaann, GI.  Last Assessment & Plan:  Relevant Hx: Course: Daily Update: Today's Plan:     Hypertension     Hypogonadism in male 02/12/2014    Kidney stone     Liver cirrhosis secondary to NASH (nonalcoholic steatohepatitis)        s/p liver transplant 2013    Obesity    [3]   Family History  Problem Relation Age of Onset    Diabetes Father     Diabetes Paternal Grandfather     Liver disease Maternal Uncle     Cancer Maternal Grandmother     Melanoma Neg Hx     Basal cell carcinoma Neg Hx     Squamous cell carcinoma Neg Hx     Kidney disease Neg Hx

## 2024-01-18 NOTE — Unmapped (Signed)
 Patient reports issues with accessing the RUE fistula on 7/22 at dialysis. Last dialysis was on 7/19. Fistula is clotted off, with no thrill/pulse and clot noted on US . Patient is mentating well today. Discussed with patient the need for a dialysis circuit study with possible intervention. In the event VIR is unable to re-open to fistula, we will place a tunneled HD line for dialysis.     Of note, patient is refusing to do the procedure with moderate sedation. Explained to the patient that requiring GA may delay care and may decrease the likelihood of recanalizing the fistula. Patient understands these risks, and wants to proceed with GA.    Informed Consent Obtained:    Consent obtained by Dr. Donnice Alexander  Risks, benefits, and alternatives including but not limited to bleeding, infection, and damage to adjacent structures were discussed with patient/patient's representative. All questions were answered to patient/patient's representative satisfaction.  Patient/Patient's representative consents and would like to proceed with the procedure.   --The patient will accept blood products in an emergent situation.  --The patient does not have a Do Not Resuscitate order in effect.

## 2024-01-18 NOTE — Unmapped (Signed)
 Care Management  Initial Transition Planning Assessment              Patient lives with girlfriend Powell in [place] [facility] [place]) in a 1 level home with 1 steps to enter.  At baseline patient is independent with ADLs. He does not receives home health services. DME is none. Powell will provide transportation and will assist with basic care at home.      General  Care Manager / Social Worker assessed the patient by : In person interview with patient, Medical record review, Discussion with Clinical Care team  Orientation Level: Oriented X4  Functional level prior to admission: Independent  Reason for referral: Discharge Planning    Contact/Decision [facility]  Extended Emergency Contact Information  Primary Emergency Contact: [facility]  Address: [address]           [place], [place] [address] [place]  Work Phone: [phone]  Mobile Phone: [phone]  Relation: Sister  Secondary Emergency Contact: Mahlon Wilfred Boone  Relation: Spouse  Interpreter needed? Yes    Legal Next of Kin / Guardian / POA / Advance Directives     HCDM (patient stated preference): [name] - Sister - [phone]    Advance Directive (Medical Treatment)  Does patient have an advance directive covering medical treatment?: Patient does not have advance directive covering medical treatment.  Reason patient does not have an advance directive covering medical treatment:: Patient does not wish to complete one at this time.    Health Care Decision Maker [HCDM] (Medical & Mental Health Treatment)  Healthcare Decision Maker: HCDM documented in the HCDM/Contact Info section.  Information offered on HCDM, Medical & Mental Health advance directives:: Patient given information.    Advance Directive (Mental Health Treatment)  Does patient have an advance directive covering mental health treatment?: Patient does not have advance directive covering mental health treatment.  Reason patient does not have an advance directive covering mental health treatment:: Patient does not wish to complete one at this time.    Readmission Information             Did the following happen with your discharge?           Patient Information  Lives with: Spouse/significant other, Children    Type of Residence: Private residence             Support Systems/Concerns: Family Members    Responsibilities/Dependents at home?: No    Home Care services in place prior to admission?: No           Currently receiving outpatient dialysis?: Yes  Facility providing dialysis (Name/Contact Info): [facility] [address] [place] [address]  [phone]    Financial Information       Need for financial assistance?: No       Social Determinants of Health  Social Drivers of Health     Food Insecurity: No Food Insecurity (01/18/2024)    Hunger Vital Sign     Worried About Running Out of Food in the Last Year: Never true     Ran Out of Food in the Last Year: Never true   Tobacco Use: Low Risk  (01/17/2024)    Patient History     Smoking Tobacco Use: Never     Smokeless Tobacco Use: Never     Passive Exposure: Past   Recent Concern: Tobacco Use - Medium Risk (01/15/2024)    Patient History     Smoking Tobacco Use: Former  Smokeless Tobacco Use: Never     Passive Exposure: Past   Transportation Needs: No Transportation Needs (01/18/2024)    PRAPARE - Transportation     Lack of Transportation (Medical): No     Lack of Transportation (Non-Medical): No   Alcohol Use: Not At Risk (11/09/2023)    Alcohol Use     How often do you have a drink containing alcohol?: Never     How many drinks containing alcohol do you have on a typical day when you are drinking?: Not on file     How often do you have 5 or more drinks on one occasion?: Never   Housing: Low Risk  (01/18/2024)    Housing     Within the past 12 months, have you ever stayed: outside, in a car, in a tent, in an overnight shelter, or temporarily in someone else's home (i.e. couch-surfing)?: No Are you worried about losing your housing?: No   Physical Activity: Insufficiently Active (07/29/2020)    Received from [facility] visits prior to 08/26/2022.    Exercise Vital Sign     On average, how many days per week do you engage in moderate to strenuous exercise (like a brisk walk)?: 2 days     On average, how many minutes do you engage in exercise at this level?: 20 min   Utilities: Low Risk  (01/18/2024)    Utilities     Within the past 12 months, have you been unable to get utilities (heat, electricity) when it was really needed?: No   Stress: No Stress Concern Present (07/29/2020)    Received from [facility] visits prior to 08/26/2022.    [employer] of Occupational Health - Occupational Stress Questionnaire     Feeling of Stress : Not at all   Interpersonal Safety: Not At Risk (01/17/2024)    Interpersonal Safety     Unsafe Where You Currently Live: No     Physically Hurt by Anyone: No     Abused by Anyone: No   Substance Use: Not on file (05/02/2023)   Intimate Partner Violence: Not At Risk (01/11/2024)    Humiliation, Afraid, Rape, and Kick questionnaire     Fear of Current or Ex-Partner: No     Emotionally Abused: No     Physically Abused: No     Sexually Abused: No   Social Connections: Unknown (07/10/2022)    Received from [facility]    Social Network     Social Network: Not on file   Financial Resource Strain: Low Risk  (01/18/2024)    Overall Financial Resource Strain (CARDIA)     Difficulty of Paying Living Expenses: Not hard at all   Health Literacy: Low Risk  (11/09/2023)    Health Literacy     : Never   Internet Connectivity: Not on file       Complex Discharge Information                                                                    Interventions:       Discharge Needs Assessment  Concerns to be Addressed:      Clinical Risk Factors:      Barriers to taking  medications: No    Prior overnight hospital stay or ED visit in last 90 days: Yes Discharge Facility/Level of Care Needs:      Readmission  Risk of Unplanned Readmission Score: UNPLANNED READMISSION SCORE: 17.71%  Predictive Model Details          18% (Medium)  Factor Value    Calculated 01/18/2024 12:05 24% Number of active inpatient medication orders 34    [facility] [place] Risk of Unplanned Readmission Model 12% Charlson Comorbidity Index 10     10% ECG/EKG order present in last 6 months     8% Latest BUN high (54 mg/dL)     7% Imaging order present in last 6 months     6% Latest hemoglobin low (12.6 g/dL)     6% Phosphorous result present     6% Number of ED visits in last six months 1     5% Age 58     5% Active anticoagulant inpatient medication order present     5% Latest creatinine high (15.56 mg/dL)     4% Diagnosis of renal failure present     2% Future appointment scheduled     1% Current length of stay 0.908 days      Readmitted Within the Last 30 Days? (No if blank)        Discharge Plan       Expected Discharge Date: 01/21/2024    Expected Transfer from Critical Care:                 Initial Assessment complete?: Yes

## 2024-01-18 NOTE — Unmapped (Signed)
 Tacrolimus  Therapeutic Monitoring Pharmacy Note    Richard [name] is a 58 y.o. male continuing tacrolimus .     Indication: Liver transplant     Date of Transplant: 02/2012      Prior Dosing Information: Home regimen 1 mg BID     Source(s) of information used to determine prior to admission dosing: Clinic Note    Goals:  Therapeutic Drug Levels  Tacrolimus  trough goal: 2-4 ng/ml    Additional Clinical Monitoring/Outcomes  Monitor renal function (SCr and urine output) and liver function (LFTs)  Monitor for signs/symptoms of adverse events (e.g., hyperglycemia, hyperkalemia, hypomagnesemia, hypertension, headache, tremor)    Previous Lab Values  Tacrolimus , Trough   Date/Time Value Ref Range Status   01/18/2024 03:51 AM 7.0 5.0 - 15.0 ng/mL Final   01/16/2024 06:02 AM 5.2 5.0 - 15.0 ng/mL Final   12/04/2022 06:32 AM 5.2 5.0 - 15.0 ng/mL Final   12/01/2022 07:02 AM 3.0 (L) 5.0 - 15.0 ng/mL Final   11/21/2022 07:31 AM 3.7 (L) 5.0 - 15.0 ng/mL Final   10/06/2014 09:00 AM 6.8 <=20.0 ng/mL Final   08/31/2014 08:34 AM 4.2  Final   08/03/2014 08:22 AM 4.7  Final   07/07/2014 08:37 AM 4.2  Final   06/09/2014 08:27 AM 3.5  Final     Tacrolimus , Timed   Date/Time Value Ref Range Status   01/11/2024 09:43 AM 8.9 ng/mL Final   12/24/2023 10:23 AM 18.1 ng/mL Final   12/04/2023 12:54 PM 3.6 ng/mL Final   11/29/2022 05:36 AM 5.0 ng/mL Final   11/27/2022 06:11 AM 4.4 ng/mL Final       Result:  Tacrolimus  level from today was drawn ~ 8h after last dose     Pharmacokinetic Considerations and Significant Drug Interactions:  Concurrent CYP3A4 substrates/inhibitors: amlodipine  10mg  (home med)    Assessment/Plan:  7/25 - tacro level drawn too early -- true trough should be closer to goal, unable to fully assess for true trough.    Recommendedation(s)  Continue current regimen of  Tacrolimus  1 mg BID   Recheck tacrolimus  level closer to a trough time    Follow-up  MWF levels ordered.   A pharmacist will continue to monitor and recommend levels as appropriate    Please page service pharmacist with questions/clarifications.    Delon LITTIE Pope, [facility]

## 2024-01-18 NOTE — Unmapped (Signed)
 Patient alert and oriented x 3 this morning.  Vitals were stable.  Currently still down for revision of shunt in VIR under general anesthesia.  Have not received report yet, will monitor when patient returns.      Shift Summary  Blood glucose levels decreased from 341 mg/dL to 795 mg/dL during the shift.   Pain levels were consistently managed at 0, with NAPS assessments indicating a relaxed and content status.   Consciousness decreased from fully awake to arousable on calling.   ceFAZolin  was administered PRN.   Overall, pain management was effective, and blood glucose levels showed improvement.     Optimal Pain Control and Function: Pain levels remained at 0 throughout the shift, with NAPS assessments indicating relaxed and content status, suggesting effective pain management interventions.     Optimal Cognitive Function: Cognitive function showed a decrease from fully awake to arousable on calling, indicating changes in consciousness over the shift.

## 2024-01-18 NOTE — Unmapped (Signed)
 [facility] Post-Procedure Note    Procedure Name: Dialysis fistulogram with attempted declot. Left tunneled HD catheter placement.     Pre-Op Diagnosis: Clotted right brachiocephalic fistula.     Post-Op Diagnosis: Same as pre-operative diagnosis    [facility] Providers    Attending: Dr. Jed Cap  Fellow/Resident: Dr. Morene Daring    Time out: Prior to the procedure, a time out was performed with all team members present. During the time out, the patient, procedure and procedure site when applicable were verbally verified by the team members, Dr. Morene Daring and Dr. Jed Cap.    Description of procedure:     Pre-procedure US  of the left AVF demonstrated it to be diffusely thrombosed with what appeared to be, at least in part chronic clot. This included thrombosis of the arteriovenous anastomosis.     In the outflow vein, access was established in  both the inflow and outflow directions with placement of 7 Fr sheaths at each access site. Outflow venography demonstrated extensive clot in the outflow vein extending to the level of peripheral cephalic arch. The right subclavian, brachiocephalic vein and SVC were patent. Via 5 Fr Kumpe catheter, TPA was instilled through the areas of outflow vein thrombosis. Via the inflow directed access, a fogarty balloon was swept through the arteriovenous anastomosis in attempt to reestablish inflow. Despite these interventions, the AVF remained clotted and flow was unable to be established and further interventions likely would not have been successful in re-opening the fistula. At this point, the decision was made to end attempts to declot the fistula and pursue placement of tunneled left sided HD catheter for dialysis access. Both access sites were removed and hemostasis achieved with manual pressure.     Successful placement of a 14.5 Fr 28 cm dual lumen tunneled HD catheter via the left internal jugular vein.       Sedation: General anesthesia     Estimated Blood Loss: Minimal  Specimens: None   Complications: Failed declot as above - otherwise no acute complication.     Plan:  Left tunneled HD catheter ready to use immediately.     See detailed procedure note with images in PACS.    The patient tolerated the procedure well without incident or complication and was transported from [facility] in stable condition.    Morene Daring, MD  [facility], PGY-6  01/18/2024 5:19 PM       I was present for the entirety of the procedure(s). Nyeema Want, MD

## 2024-01-19 LAB — MAGNESIUM: MAGNESIUM: 2.4 mg/dL (ref 1.6–2.6)

## 2024-01-19 LAB — CBC
HEMATOCRIT: 32.8 % — ABNORMAL LOW (ref 39.0–48.0)
HEMOGLOBIN: 10.7 g/dL — ABNORMAL LOW (ref 12.9–16.5)
MEAN CORPUSCULAR HEMOGLOBIN CONC: 32.6 g/dL (ref 32.0–36.0)
MEAN CORPUSCULAR HEMOGLOBIN: 29 pg (ref 25.9–32.4)
MEAN CORPUSCULAR VOLUME: 89 fL (ref 77.6–95.7)
MEAN PLATELET VOLUME: 9.2 fL (ref 6.8–10.7)
PLATELET COUNT: 177 10*9/L (ref 150–450)
RED BLOOD CELL COUNT: 3.68 10*12/L — ABNORMAL LOW (ref 4.26–5.60)
RED CELL DISTRIBUTION WIDTH: 13.7 % (ref 12.2–15.2)
WBC ADJUSTED: 5.1 10*9/L (ref 3.6–11.2)

## 2024-01-19 LAB — COMPREHENSIVE METABOLIC PANEL
ALBUMIN: 2.9 g/dL — ABNORMAL LOW (ref 3.4–5.0)
ALKALINE PHOSPHATASE: 403 U/L — ABNORMAL HIGH (ref 46–116)
ALT (SGPT): 39 U/L (ref 10–49)
ANION GAP: 15 mmol/L — ABNORMAL HIGH (ref 5–14)
AST (SGOT): 11 U/L (ref <=34)
BILIRUBIN TOTAL: 0.8 mg/dL (ref 0.3–1.2)
BLOOD UREA NITROGEN: 61 mg/dL — ABNORMAL HIGH (ref 9–23)
BUN / CREAT RATIO: 4
CALCIUM: 8.3 mg/dL — ABNORMAL LOW (ref 8.7–10.4)
CHLORIDE: 98 mmol/L (ref 98–107)
CO2: 21 mmol/L (ref 20.0–31.0)
CREATININE: 16.71 mg/dL — ABNORMAL HIGH (ref 0.73–1.18)
EGFR CKD-EPI (2021) MALE: 3 mL/min/1.73m2 — ABNORMAL LOW (ref >=60)
GLUCOSE RANDOM: 380 mg/dL — ABNORMAL HIGH (ref 70–179)
POTASSIUM: 3.9 mmol/L (ref 3.4–4.8)
PROTEIN TOTAL: 6.1 g/dL (ref 5.7–8.2)
SODIUM: 134 mmol/L — ABNORMAL LOW (ref 135–145)

## 2024-01-19 LAB — PHOSPHORUS: PHOSPHORUS: 5.3 mg/dL — ABNORMAL HIGH (ref 2.4–5.1)

## 2024-01-19 LAB — APTT
APTT: 81 s — ABNORMAL HIGH (ref 24.8–38.4)
APTT: 290 s (ref 24.8–38.4)
HEPARIN CORRELATION: 0.5
HEPARIN CORRELATION: 1.7

## 2024-01-19 LAB — TACROLIMUS LEVEL, TIMED: TACROLIMUS BLOOD: 5.3 ng/mL

## 2024-01-19 MED ORDER — ACETAMINOPHEN 325 MG TABLET
ORAL_TABLET | Freq: Three times a day (TID) | ORAL | 0 refills | 5.00000 days | PRN
Start: 2024-01-19 — End: ?

## 2024-01-19 MED ORDER — HYDROMORPHONE 2 MG TABLET
ORAL_TABLET | Freq: Once | ORAL | 0 refills | 5.00000 days | PRN
Start: 2024-01-19 — End: 2024-01-24

## 2024-01-19 MED ORDER — FAMOTIDINE 20 MG TABLET
ORAL_TABLET | Freq: Two times a day (BID) | ORAL | 0 refills | 90.00000 days
Start: 2024-01-19 — End: 2024-04-18

## 2024-01-19 MED ADMIN — HYDROmorphone (DILAUDID) injection Syrg 0.5 mg: .5 mg | INTRAVENOUS | @ 20:00:00 | Stop: 2024-02-02

## 2024-01-19 MED ADMIN — mycophenolate (CELLCEPT) capsule 250 mg: 250 mg | ORAL | @ 13:00:00

## 2024-01-19 MED ADMIN — carvedilol (COREG) tablet 6.25 mg: 6.25 mg | ORAL | @ 17:00:00

## 2024-01-19 MED ADMIN — insulin lispro (HumaLOG) injection CORRECTIONAL 0-20 Units: 0-20 [IU] | SUBCUTANEOUS | @ 17:00:00

## 2024-01-19 MED ADMIN — insulin NPH (HumuLIN,NovoLIN) injection 14 Units: 14 [IU] | SUBCUTANEOUS | @ 18:00:00 | Stop: 2024-01-19

## 2024-01-19 MED ADMIN — oxyCODONE (ROXICODONE) immediate release tablet 5 mg: 5 mg | ORAL | @ 18:00:00 | Stop: 2024-01-31

## 2024-01-19 MED ADMIN — heparin 25,000 Units/250 mL (100 units/mL) in 0.45% saline infusion (premade): 0-24 [IU]/kg/h | INTRAVENOUS | @ 04:00:00 | Stop: 2024-01-19

## 2024-01-19 MED ADMIN — rifAXIMin (XIFAXAN) tablet 550 mg: 550 mg | ORAL | @ 17:00:00 | Stop: 2024-01-30

## 2024-01-19 MED ADMIN — tacrolimus (PROGRAF) capsule 1 mg: 1 mg | ORAL | @ 13:00:00

## 2024-01-19 MED ADMIN — insulin lispro (HumaLOG) injection CORRECTIONAL 0-20 Units: 0-20 [IU] | SUBCUTANEOUS | @ 11:00:00

## 2024-01-19 MED ADMIN — ursodiol (ACTIGALL) capsule 600 mg: 600 mg | ORAL | @ 17:00:00

## 2024-01-19 MED ADMIN — insulin NPH (HumuLIN,NovoLIN) injection 14 Units: 14 [IU] | SUBCUTANEOUS | @ 04:00:00 | Stop: 2024-01-19

## 2024-01-19 MED ADMIN — gentamicin-sodium citrate lock solution in NS: 2.1 mL | @ 16:00:00

## 2024-01-19 MED ADMIN — amlodipine (NORVASC) tablet 10 mg: 10 mg | ORAL | @ 17:00:00

## 2024-01-19 MED ADMIN — famotidine (PEPCID) tablet 20 mg: 20 mg | ORAL | @ 18:00:00

## 2024-01-19 MED ADMIN — insulin lispro (HumaLOG) inj PERCENTAGE MEAL EATEN 5 Units: 5 [IU] | SUBCUTANEOUS | @ 18:00:00 | Stop: 2024-01-19

## 2024-01-19 MED ADMIN — lactulose (CEPHULAC) packet 20 g: 20 g | ORAL | @ 17:00:00

## 2024-01-19 MED ADMIN — acetaminophen (TYLENOL) tablet 650 mg: 650 mg | ORAL | @ 18:00:00 | Stop: 2024-01-19

## 2024-01-19 MED ADMIN — insulin lispro (HumaLOG) injection CORRECTIONAL 0-20 Units: 0-20 [IU] | SUBCUTANEOUS | @ 22:00:00

## 2024-01-19 NOTE — Unmapped (Signed)
 Hemodialysis 4 hours removed 4 liters fluid. New HD cathter at optimal flows.  Safety maintained    Problem: Hemodialysis  Goal: Safe, Effective Therapy Delivery  Outcome: Ongoing - Unchanged  Goal: Effective Tissue Perfusion  Outcome: Ongoing - Unchanged  Goal: Absence of Infection Signs and Symptoms  Outcome: Ongoing - Unchanged

## 2024-01-19 NOTE — Unmapped (Signed)
 Vascular Surgery Consult Note    Requesting Attending Physician:  Garen Cone, MD  Service Requesting Consult:  [facility] (MDW)    Assessment:  Richard [name] is a 58 y.o. 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    RUE AVF has likely been clotted for almost a week per Pt's report of inability to receive dialysis Thurs and onset of pain and loss of thrill early this week. Given VIR's findings, Pt will require a replacement to continue long-term iHD, but has a catheter in place through which he received dialysis today without issue. AVF with no thrill and TTP consistent with VIR-confirmed thrombosis. Primary team is consulting to determine when Pt will be able to be scheduled for the OR for placement of a replacement to aid in their dispo planning.    PLAN:  - No acute vascular surgical intervention at this time  - Continue iHD via HD cath  - Pt can follow up with his primary Vascular Surgeon Dr. Marchelle at [facility]  - If Pt wishes to establish Vascular Surgery care at [facility] main he can do so at the [facility] Vascular clinic    If you have any questions, concerns or changes in the patient's clinical status, please feel free to contact [facility] consult pager [phone].    History of Present Illness:   Chief Complaint:  Need for new HD AVF    Richard [name] is a 58 y.o. male with PMHx as below who is seen in consultation for establishment of a new Hd AVF at the request of Garen Cone, MD on the [facility] (MDW) service.     Pt w/ extensive PMHx on iHD consulted on regarding establishment of new iHD AVF after failed VIR attempt to declot R brachiocephalic AVF 7/25, noting that further interventions would likely fail. They placed a L IJ HD catheter without issue, through which Pt receive full iHD session today without issue. Pt's hospital course has otherwise been uncomplicated following successful dialysis, he is currently still here for AVF pain management.    Past Medical History:   Past Medical History[1]    Past Surgical History:  Past Surgical History[2]    Medications:  Medications Ordered Prior to Encounter[3]    Allergies:  Allergies[4]    Family History:  Family History[5]    Social History:   Short Social History[6]    Review of Systems  10 systems were reviewed and are negative except as noted specifically in the HPI.    Objective  Vitals:   Temp:  [36.7 ??C (98.1 ??F)-37.6 ??C (99.7 ??F)] 36.7 ??C (98.1 ??F)  Pulse:  [56-98] 56  Resp:  [17-20] 17  BP: (114-188)/(30-97) 135/72  MAP (mmHg):  [88-120] 88  SpO2:  [98 %-100 %] 98 %    Intake/Output last 3 shifts:  I/O last 3 completed shifts:  In: 300 [I.V.:300]  Out: 4020 [Other:4000; Blood:20]    Physical Exam:   Constitutional: NAD  Eyes: PERRL, EOM intact, no scleral icterus or conjunctival erythema  Ears, nose, mouth and throat: Moist mucus membranes, no discharge  Neck: Supple, trachea midline, no gross masses. L IJ HD catheter in place  Respiratory: No increased WOB, Symmetrical chest rise, no audible wheeze or stridor  Cardiovascular: Extremities are warm and well perfused, no active bleeding  Gastrointestinal: Abdomen is soft, nontender, nondistended with no rebound or guarding  Musculoskeletal: No gross limitations in ROM. RUE AVF w/ mild erythema and TTP, no thrill. Strong distal RUE pulses  Skin: No rashes, lesions, or wounds.  Neurologic: Alert. No gross focal neurological deficits  Psychiatric: Appropriate mood and affect      Most Recent Labs:  Lab Results   Component Value Date    WBC 5.1 01/19/2024    HGB 10.7 (L) 01/19/2024    HCT 32.8 (L) 01/19/2024    PLT 177 01/19/2024       Lab Results   Component Value Date    NA 134 (L) 01/19/2024    K 3.9 01/19/2024    CL 98 01/19/2024    CO2 21.0 01/19/2024    BUN 61 (H) 01/19/2024    CREATININE 16.71 (H) 01/19/2024    CALCIUM  8.3 (L) 01/19/2024    MG 2.4 01/19/2024    PHOS 5.3 (H) 01/19/2024       Imaging:  IR Insert Tunneled Catheter (Age Greater Than 5 Years)  Result Date: 01/18/2024  PROCEDURE: Tunneled central venous catheter placement EXAM: IR INSERT TUNNELED CATHETER AGE GREATER THAN 5 YRS ACCESSION: [id number] UN REPORT DATE: 01/18/2024 7:01 PM Procedural Personnel Attending physician(s): Dr. Jed Cap Resident physician(s): Dr.  Morene Daring Advanced practice provider(s): None Pre-procedure diagnosis: End-stage renal disease with thrombosed right arm brachiocephalic fistula. Post-procedure diagnosis: Same Indication: Hemodialysis Additional clinical history: None _______________________________________________________________ PROCEDURE SUMMARY: - Venous access with ultrasound guidance - Tunneled central venous catheter insertion with fluoroscopic guidance - Additional procedure(s): None PROCEDURE DETAILS: Pre-procedure Consent: Informed consent for the procedure including risks, benefits and alternatives was obtained and time-out was performed prior to the procedure. Preparation: The site was prepared and draped using maximal sterile barrier technique including cutaneous antisepsis. Anesthesia/sedation Level of anesthesia/sedation: General anesthesia Anesthesia/sedation administered by: Anesthesiology Total intra-service sedation time (minutes): I personally spent 0 minutes, continuously monitoring the patient face-to-face during the administration of moderate sedation. Radiology nurse was present for the duration of the procedure to assist in patient monitoring. Pre and Post Sedation activities have been reviewed. Access Local anesthesia was administered. The vessel was evaluated with ultrasonography and determined to be patent. Real time ultrasound was used to visualize needle entry into the vessel and a permanent image was stored. Laterality: Left Vein accessed: Internal jugular vein Access technique: Micropuncture set with 21 gauge needle Venography Indication for venography: Not applicable Vein catheterized: Not applicable Findings: Not applicable Catheter placement An incision was made near the venous access site and the catheter was tunneled subcutaneously to the venous access site. The catheter was advanced via a peel-away sheath into the vein under fluoroscopic guidance. Catheter tip location was fluoroscopically verified and a permanent image was stored. Catheter placed: Palindrome hemodialysis catheter Lot number: [id number] Catheter size Braulio): 14.5 x 28 cm Lumens: Dual-lumen Catheter tip: right atrium Catheter flush: Heparin  (1000 units/mL) Closure A sterile dressing was applied. Access site closure technique: Absorbable suture and tissue adhesive Catheter securement technique: Non-absorbable suture Contrast Contrast agent: None Contrast volume (mL): 0 Radiation Dose Fluoroscopy time (minutes): 9.2 Reference Air Kerma (mGy): 163.9 Additional Details Additional description of procedure: None Registry event: V/3/g Device used: None Equipment details: None Unique Device Identifiers: Not available Specimens removed: None Estimated blood loss (mL): Less than 10 Standardized report: [email or web address]_v3.1 Attestation Signer name: Hyeon Yu I attest that I supervised the procedure and was immediately available. I  reviewed the stored images and agree with the report as written. _______________________________________________________________ Complications: No immediate complications.     Insertion of left-sided tunneled Dual-lumen Hemodialysis central venous catheter in the internal jugular vein, with tip in the expected location of the right atrium. Plan: The catheter may be used immediately.     IR Dialysis Circuit Study  Result Date: 01/18/2024  PROCEDURE: Dialysis circuit declot EXAM: IR DIALYSIS CIRCUIT STUDY ACCESSION: [id number] UN REPORT DATE: 01/18/2024 6:50 PM Procedural Personnel Attending physician(s): Dr. Jed Cap Resident physician(s): Dr. Morene Daring Advanced practice provider(s): None Pre-procedure diagnosis: Thrombosed fistula Post-procedure diagnosis: Same Indication: Thrombosed fistula Additional clinical history: None _______________________________________________________________ PROCEDURE SUMMARY: - Access of dialysis circuit with ultrasound guidance - Procedure: Successful attempted thrombectomy/declot as described below. - Additional procedure(s): Placement of tunneled HD catheter given unsuccessful thrombectomy procedure. (Please see separately dictated same day procedural report for further details) PROCEDURE DETAILS: Pre-procedure Consent: Informed consent for the procedure including risks, benefits and alternatives was obtained and time-out was performed prior to the procedure. Preparation: The site was prepared and draped using maximal sterile barrier technique including cutaneous antisepsis. Anesthesia/sedation Level of anesthesia/sedation: General anesthesia Anesthesia/sedation administered by: Anesthesiology Total intra-service sedation time (minutes): I personally spent 0 minutes, continuously monitoring the patient face-to-face during the administration of moderate sedation. Radiology nurse was present for the duration of the procedure to assist in patient monitoring. Pre and Post Sedation activities have been reviewed. Initial dialysis circuit evaluation Dialysis circuit side: Right Dialysis circuit type: Brachial-cephalic Initial circuit palpation: No pulse, no thrill Access (towards inflow) Local anesthesia was administered. The dialysis circuit was sonographically evaluated and judged to be thrombosed. Real time ultrasound was used to visualize needle entry into the dialysis circuit and a permanent image was stored.. This included thrombosis of the arteriovenous anastomosis. Of note sonographically, a large volume of the visualized clot appear to be chronic in nature. Access technique: Micropuncture set with 21 gauge needle Sheath size (French): 7 Access (towards outflow) Local anesthesia was administered. The dialysis circuit was sonographically evaluated and judged to be thrombosed. Real time ultrasound was used to vistualize needle entry into the dialysis circuit and a permanent image was stored.. Access technique: Micropuncture set with 21 gauge needle Sheath size (French): 7 Initial angiography Initial angiography of the dialysis circuit, outflow veins, and central veins was performed. Findings: Thrombosed arteriovenous anastomosis. Patent central cephalic arch, subclavian vein and brachiocephalic vein and SVC. Extensive thrombosis of the fistula/outflow involving the cephalic vein just peripheral to the arch and extending throughout the remainder of the cephalic outflow vein and fistula. Intra-procedural thrombolytic injection Thrombolytic was injected within the thrombosed segment of the dialysis circuit. Thrombolytic agent: alteplase  Thrombolytic dose: 4 mL Establishment of arterial inflow Method: Establishment of arterial inflow was attempted with balloon device, however this was unsuccessful. Final angiography Findings: Unsuccessful thrombectomy with persistent thrombosis of the fistula and persistent thrombosis of the arteriovenous anastomosis. Final circuit palpation: No pulse and no thrill Closure (inflow) The sheath was removed and hemostasis was achieved with manual compression. A sterile dressing was applied. Closure (outflow) The sheath was removed and hemostasis was achieved with manual compression. A sterile dressing was applied. Contrast Contrast agent: Omnipaque  300 Contrast volume (mL): 50 Radiation Dose Fluoroscopy time (minutes): 3.7 Reference Air Kerma (mGy): 4.45 Additional Details Additional description of procedure: None Registry event: V/3/g Device used: None Equipment details: None Unique Device Identifiers: Not available Specimens removed: None Estimated blood loss (mL): Less than 10 Standardized report: [email or web address]_DialysisCircuitDeclot_v3.1 Attestation Signer name: Hyeon  Babara I attest that I was present for the entire procedure. I reviewed the stored images and agree with the report as written. _______________________________________________________________ Complications: No immediate complications aside from unsuccessful thrombectomy/declotting procedure of the patient's AV fistula.     Unsuccessful thrombectomy/declotting procedure of the patient's thrombosed AV fistula. Of note, sonographic evaluation of the fistula demonstrated a large volume of the clot appearing to be chronic in nature. Plan: Proceed with placement of tunneled hemodialysis catheter for dialysis access.     PVL Hemodialysis Graft/Fistula Duplex  Result Date: 01/18/2024    Peripheral Vascular Lab     [address]   [place], [place] [address]  PVL HEMODIALYSIS GRAFT-FISTULA DUPLEX Patient Demographics Pt. Name: DAMIAN BUCKLES Location: [facility] Inpatient Lab MRN:      [id number]         Sex:      M DOB:      Sep 26, 1965         Age:      62 years  Study Information Authorizing         [id number] KRISTI ANN    Performed Time       01/17/2024 Provider Name       NJARAVELIL                                 10:13:30 PM Ordering Physician  KRISTI ANN NJARAVELIL Patient Location     [facility] Accession Number    [id number] UN        Technologist         Dorn Curls Diagnosis:                                Assisting                                           Technologist Ordered Reason For Exam: Patency of Right AVF, concern for clot  Reason For Exam = Unable to dialyze through AVF/AVG. Access site = Right Upper Extremity. Access Type = brachial-cephalic AVF. Right brachiocephalic AVF placed on 01/15/23.  Final Interpretation Occludeed AVF in the right arm _ See below.   Electronically signed by [id number] Almeda Dolores MD on 01/18/2024 at 8:23:08 AM.  Examination Protocol: The dialysis access is interrogated using Doppler ultrasound. B-mode imaging and color Doppler are used to evaluate for diameters, depths, velocities, flow volumes, and abnormalities.When arterial steal is suspected by ordering physician, PW Doppler signals are evaluated at the wrist and PPG tracings are obtained from the digits. In the presence of retrograde flow at the wrist or nonpulsatile PPG tracings, dialysis access may be momentarily occluded to evaluate hemodynamic changes. In the presence of inadaquate fistula maturation, the afferant arteries and efferent veins are evaluated for obstruction. In the presence of suspected venous hypertension, the central veins are evaluated for stenosis.  Findings: What appeared to be an occluded brachiocephalic AVF was visualized in the right arm.    PVL Venous Duplex Upper Extremity Right  Result Date: 01/18/2024   Peripheral Vascular Lab     [address]   [place], [place] [address]  PVL VENOUS DUPLEX UPPER EXTREMITY RIGHT Patient Demographics Pt. Name: COURTEZ TWADDLE Location: [facility] Inpatient Lab MRN:      [id number]  Sex:      M DOB:      1966/04/11         Age:      45 years  Study Information Authorizing Provider [phone] EVAN T      Performed Time       01/17/2024 10:01:55 Name                 WOODS                                    PM Ordering Physician   ARTIST ONEIDA ILL        Patient Location     [facility] Accession Number     [id number] UN      Technologist         Dorn Curls Diagnosis:                               Assisting                                          Technologist Ordered Reason For Exam: no bruit in AV fistula, pain Other Indication: as note above per ordering provider Risk Factors: Immobility and DVT (RT ACF-wrist cephalic vein on 2/73/7975.). Other Factors: (Right brachiocephalic AVF on 01/15/23). Anticoagulation: (Heparin ).  Final Interpretation Right No evidence of DVT detected in the central veins. Evidence of acute DVT in the central veins. Left Subclavian vein Doppler signal is within normal limits. This indirect finding suggests central vein patency.  Electronically signed by [id number] Almeda Dolores MD on 01/18/2024 at 8:21:30 AM.  Examination Protocol The internal jugular, brachiocephalic, subclavian, and axillary, brachial, basilic and cephalic veins are routinely assessed on the requested limb. Spectral and Color Doppler data is the primary method for evaluating the brachiocephalic and subclavian veins. Venous compression is used to evaluate the internal jugular, axillary, and upper arm veins. If a unilateral exam is requested, a contralateral subclavian vein Doppler signal is required for comparison.  Limitations:  Duplex Findings Right No abnormality of venous architecture is observed in the central veins. All Doppler signals are appropriately pulsatile with ventilatory excursions. Color Doppler notes appropriate filling in all vessels. Evidence of acute obstruction is visualized in the brachiocephalic AVF. All other venous findings in the upper extremity are within normal limits. Left Subclavian vein Doppler signal appears within normal limits.  Summary of Findings Right No evidence of obstruction was seen in the central veins. Evidence of acute obstruction is visualized in the brachiocephalic AVF. All other venous findings in the upper extremity are within normal limits. Left Normal subclavian vein Doppler signal suggests proximal central venous patency.     ECG 12 Lead  Result Date: 01/17/2024  ATRIAL FIBRILLATION WITH SLOW VENTRICULAR RESPONSE LEFT AXIS DEVIATION RIGHT BUNDLE BRANCH BLOCK INFERIOR INFARCT , AGE UNDETERMINED ABNORMAL ECG WHEN COMPARED WITH ECG OF 17-Jan-2024 09:17, INFERIOR INFARCT IS NOW PRESENT    ECG 12 Lead  Result Date: 01/17/2024  ATRIAL FIBRILLATION RIGHT BUNDLE BRANCH BLOCK LEFT ANTERIOR FASCICULAR BLOCK BIFASCICULAR BLOCK POSSIBLE LATERAL INFARCT  ABNORMAL ECG WHEN COMPARED WITH ECG OF 15-Jan-2024 14:47, SIGNIFICANT CHANGES HAVE OCCURRED    US  Liver Transplant  Result Date: 01/16/2024  EXAM: US  LIVER TRANSPLANT ACCESSION: [id number] UN REPORT DATE: 01/16/2024 9:36  AM CLINICAL INDICATION: 58 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 FINDINGS: Limited examination due to patient body habitus and inability to fully participate in exam. HEPATOBILIARY: There is coarse echotexture of the liver however evaluation is limited as described above. No focal hepatic lesions. No intrahepatic biliary ductal dilatation. The common bile duct is dilated but may be within normal limits for postcholecystectomy patient. The gallbladder is surgically absent.      Liver: 14.2 cm      Common bile duct: 1.2 cm PANCREAS: Limited evaluation due to bowel gas shadowing. SPLEEN: At the upper limits of normal size.      Spleen: 12.7 cm KIDNEYS: The kidneys are not well visualized due to bowel gas shadowing. The candidate measurements show that they are small in size. No solid masses or calculi. No hydronephrosis.      Right kidney: 8.3 cm      Left kidney: 7.9 cm VESSELS: - Portal vein: The main and right portal veins are patent with hepatopetal flow. Normal main portal vein velocity (0.20 m/s or greater). The main and right portal veins are small in caliber. The left portal vein could not be visualized which is likely secondary to the limitations of the study.      Main portal vein diameter: 0.33 cm      Main portal vein pre anastomosis velocity: 0.14 m/s      Main portal vein anastomosis velocity: 0.16 m/s      Main portal vein post anastomosis velocity: 0.17 m/s      Anterior right portal vein velocity: 0.09 m/s      Posterior right portal vein velocity: 0.1 m/s      Left portal vein velocity: Not visualized      Right portal vein flow: hepatopetal      Left portal vein flow: Non Vis - Splenic vein: Patent, with hepatopetal flow.      Splenic vein midline: hepatopetal      Splenic vein proximal: Hepatopetal - Hepatic veins/IVC: The IVC, left, middle and right hepatic veins are were patent.      Left hepatic vein phasicity/flow: triphasic      Middle hepatic vein phasicity/flow: triphasic      Right hepatic vein phasicity/flow: triphasic      Inferior vena cava phasicity/flow: triphasic - Hepatic artery: Patent with resistive indices within normal limits.      Common hepatic artery resistive index: 0.8 and systolic acceleration time 92 msec (previously 0.82, 33)      Right hepatic artery resistive index: 0.77 and systolic acceleration time 133 msec (previously 0.75, 21)      Left hepatic artery resistive index: 0.73 and systolic acceleration time 58 msec (previously 0.50, 96) - Visualized proximal aorta:  unremarkable OTHER: No ascites.     Significantly limited examination due to patient body habitus and inability to partly participate in exam. - Sluggish flow in the portal venous system. The main portal vein and right portal vein are small in caliber/diminutive but patent. Small caliber of the portal venous system could be associated with portal hypertension. Clinical correlation is recommended. - Patent hepatic arteries and hepatic veins. The hepatic veins are also difficult to visualize secondary to limitations of the study. - Coarse echotexture of the liver parenchyma which could be seen secondary to chronic liver disease.SABRA -  Limited evaluation of the kidneys. They appear to be small in size and increased in echogenicity which can be seen with chronic medical renal disease. - Nonspecific mild common bile duct dilatation which could be seen secondary to postcholecystectomy state. Clinical correlation recommended.     XR Chest 2 views  Result Date: 01/15/2024  EXAM: XR CHEST 2 VIEWS ACCESSION: [id number] UN REPORT DATE: 01/15/2024 5:29 PM CLINICAL INDICATION: ALTERED MENTAL STATUS  TECHNIQUE: PA and Lateral Chest Radiographs COMPARISON: Chest radiograph 11/23/2022 and 11/20/2022 FINDINGS: Enlarged cardiac silhouette. No focal pulmonary consolidation. Mildly enlarged right hemidiaphragm. No sizable pleural effusion or pneumothorax.     Cardiomegaly. Mildly enlarged right hemidiaphragm. No focal pulmonary consolidation.     ECG 12 Lead  Result Date: 01/15/2024  ATRIAL FLUTTER WITH VARIABLE A-V BLOCK WITH PREMATURE VENTRICULAR OR ABERRANTLY CONDUCTED BEATS LEFT AXIS DEVIATION RIGHT BUNDLE BRANCH BLOCK POSSIBLE ANTEROLATERAL INFARCT  , AGE UNDETERMINED ABNORMAL ECG WHEN COMPARED WITH ECG OF 01-Oct-2023 09:27, NON-SPECIFIC ST/T WAVE CHANGES MORE PROMINENT Confirmed by Cleotilde Moccasin ([id number]) on 01/15/2024 3:03:35 PM    Sheran Florida, MD  Emergency Medicine, PGY2              [1]   Past Medical History:  Diagnosis Date    CHF (congestive heart failure)        COVID-19 07/18/2019    Diabetes mellitus        Family history of malignant neoplasm of prostate 06/23/2015    Heart disease     Heart murmur     Hepatic cirrhosis    06/23/2015    Overview:  Secondary to NASH; followed by Dr. Lindaann, GI.  Last Assessment & Plan:  Relevant Hx: Course: Daily Update: Today's Plan:     Hypertension     Hypogonadism in male 02/12/2014    Kidney stone     Liver cirrhosis secondary to NASH (nonalcoholic steatohepatitis)        s/p liver transplant 2013    Obesity    [2]   Past Surgical History:  Procedure Laterality Date    CHOLECYSTECTOMY      liver transpant      LIVER TRANSPLANTATION  06/27/2011    PR AV FIST REVISE GRFT,W THROMBECTOMY Right 01/15/2023    Procedure: REVISION, ARTERIOVENOUS FISTULA W/ THROMBECTOMY, AUTOGENOUS OR NONAUTOGENOUS DIALYSIS GRAFT (SEP. PROC), LOWER EXTREMITY;  Surgeon: Marchelle Kitchens, MD;  Location: [facility];  Service: General Surgery    PR COLONOSCOPY W/BIOPSY SINGLE/MULTIPLE N/A 08/13/2018    Procedure: COLONOSCOPY, FLEXIBLE, PROXIMAL TO SPLENIC FLEXURE; WITH BIOPSY, SINGLE OR MULTIPLE;  Surgeon: Thedora Alm Plain, MD;  [facility]: [facility];  Service: Gastroenterology    PR COLSC FLX W/RMVL OF TUMOR POLYP LESION SNARE TQ N/A 08/13/2018    Procedure: COLONOSCOPY FLEX; W/REMOV TUMOR/LES BY SNARE;  Surgeon: Thedora Alm Plain, MD;  Location: [facility];  Service: Gastroenterology    PR CREAT AV FISTULA,NON-AUTOGENOUS GRAFT Right 11/29/2022    Procedure: CREATE AV FISTULA (SEPARATE PROC); NONAUTOGENOUS GRAFT (EG, BIOLOGICAL COLLAGEN, THERMOPLASTIC GRAFT), UPPER EXTREMITY;  Surgeon: Marchelle Kitchens, MD;  Location: [facility];  Service: General Surgery    PR UPPER GI ENDOSCOPY,BIOPSY N/A 07/13/2015    Procedure: UGI ENDOSCOPY; WITH BIOPSY, SINGLE OR MULTIPLE;  Surgeon: Elspeth Jerilynn Reek, MD;  Location: [facility];  Service: Gastroenterology    PR UPPER GI ENDOSCOPY,BIOPSY N/A 02/13/2023    Procedure: UGI ENDOSCOPY; WITH BIOPSY, SINGLE OR MULTIPLE;  Surgeon: Minnie Krystal Claude, MD;  Location: [facility];  Service: Gastroenterology   [3]   No current facility-administered medications on file prior to encounter.     Current Outpatient Medications on File Prior to Encounter   Medication Sig Dispense Refill    amlodipine  (NORVASC ) 10 MG tablet Take 1 tablet (10 mg total) by mouth daily. 90 tablet 3    apixaban  (ELIQUIS ) 5 mg Tab Take 1 tablet (5 mg total) by mouth two (2) times a day. 180 tablet 3    atorvastatin  (LIPITOR ) 40 MG tablet Take 1 tablet (40 mg total) by mouth daily. 100 tablet 3    blood-glucose meter,continuous (DEXCOM G6 RECEIVER) Misc 1 each by Miscellaneous route in the morning. Dispense 1 receiver annually.  Sent to [facility]. 1 each 1    blood-glucose sensor (DEXCOM G7 SENSOR) Devi Use 1 sensor every 10 days. 3 each 11    blood-glucose transmitter (DEXCOM G6 TRANSMITTER) Devi 1 each by Miscellaneous route Every three (3) months. [facility] blood-glucose transmitter (DEXCOM G6 TRANSMITTER) Devi Use as directed to monitor blood glucose 1 each 3    carvedilol  (COREG ) 6.25 MG tablet Take 1 tablet (6.25 mg total) by mouth two (2) times a day. Patient unsure about dosage      CHOLECALCIFEROL, VITAMIN D3, (VITAMIN D3 ORAL) Take 2,000 Units by mouth in the morning.      gabapentin  (NEURONTIN ) 300 MG capsule Take 1 capsule (300 mg total) by mouth nightly. 30 capsule 12    insulin  aspart (NOVOLOG  FLEXPEN U-100 INSULIN ) 100 unit/mL (3 mL) injection pen Inject 0.35 mL (35 Units total) under the skin Three (3) times a day before meals. 60 mL 0    insulin  degludec (TRESIBA  FLEXTOUCH U-200) 200 unit/mL (3 mL) InPn Inject 0.3 mL (60 Units total) under the skin daily. Max dose of 100 units per day. 27 mL 12    lactulose  10 gram/15 mL solution Take 45 mL (30 g total) by mouth two (2) times a day.      lanthanum  (FOSRENOL ) 1000 MG chewable tablet Chew 1 tablet (1,000 mg total) Three (3) times a day with a meal. 90 tablet 11    multivitamin (TAB-A-VITE/THERAGRAN) per tablet Take 1 tablet by mouth in the morning.      mycophenolate  (CELLCEPT ) 250 mg capsule Take 1 capsule (250 mg total) by mouth two (2) times a day. 60 capsule 11    pen needle, diabetic (TRUEPLUS PEN NEEDLE) 32 gauge x 5/32 (4 mm) Ndle Use with insulin  up to 4 times a day as needed. 100 each 0    pen needle, diabetic 32 gauge x 5/32 (4 mm) Ndle Use with insulin  up to 4 times/day as needed. 100 each 0    polyethylene glycol (GOLYTELY ) 236-22.74-6.74 gram solution Take by mouth as directed per [facility] GI prep instructions, for split bowel prep. 4000 mL 0    rifAXIMin  (XIFAXAN ) 550 mg Tab Take 1 tablet (550 mg total) by mouth two (2) times a day. 180 tablet 3    tacrolimus  (PROGRAF ) 0.5 MG capsule Take 2 capsules (1 mg total) by mouth two (2) times a day. 360 capsule 3    tirzepatide  (MOUNJARO ) 12.5 mg/0.5 mL PnIj Inject 12.5 mg under the skin every seven (7) days. 2 mL 0    [START ON 02/11/2024] tirzepatide  (MOUNJARO ) 15 mg/0.5 mL PnIj Inject 15 mg under the skin every seven (7) days. 2 mL 3    tirzepatide  (MOUNJARO ) 7.5 mg/0.5 mL PnIj Inject 7.5 mg under the skin  every seven (7) days.     [4] No Known Allergies  [5]   Family History  Problem Relation Age of Onset    Diabetes Father     Diabetes Paternal Grandfather     Liver disease Maternal Uncle     Cancer Maternal Grandmother     Melanoma Neg Hx     Basal cell carcinoma Neg Hx     Squamous cell carcinoma Neg Hx     Kidney disease Neg Hx    [6]   Social History  Tobacco Use    Smoking status: Never     Passive exposure: Past    Smokeless tobacco: Never   Vaping Use    Vaping status: Never Used   Substance Use Topics    Alcohol use: No    Drug use: No

## 2024-01-19 NOTE — Unmapped (Signed)
 Patient admitted for Hepatic encephalopathy. Patient remained alert oriented X 3. On room air. Vitals stable. Patient went for the dialysis today. Patient had 1 BM during the shift. Blood glucose checked and insulin  coverage given as per orders. Heparin  gtt discontinued during the shift as per orders. Patient complained of heartburn and pain in left arm PRN oxycodone  and IV dilaudid  given with good effect. Pepcid  was given for heartburn with good effect. Fall precaution  maintained.     Shift Summary  Pain management required multiple interventions, including oxyCODONE , acetaminophen , and HYDROmorphone .    Blood pressure showed fluctuations, with an initial high reading decreasing over the shift.    The unplanned readmission score increased slightly, suggesting a need for continued monitoring.    No new skin issues were observed, and the peripheral IV site remained stable.    Overall, the patient experienced fluctuating pain levels and required multiple pain management interventions.     Absence of Hospital-Acquired Illness or Injury: No new skin abnormalities were noted, and the peripheral IV site remained dry and intact throughout the shift.     Optimal Comfort and Wellbeing: Pain levels fluctuated, with a significant decrease after administration of oxyCODONE  and acetaminophen , but later increased again, requiring HYDROmorphone .     Readiness for Transition of Care: The unplanned readmission score increased slightly over the shift.

## 2024-01-19 NOTE — Unmapped (Incomplete)
 Physician Discharge Summary    Admit date: 01/15/2024    Discharge date and time: ***    Discharge to: Home    Discharge Service: Med [facility] (MDW)    Discharge Attending Physician: Garen Cone, MD    Discharge Diagnoses: Hepatic Encephalopathy    Procedures: VIR Circuit Study of AV Fistula    Pertinent Test Results: Chronic clot in RUE AV fistula.     Hospital Course:  [place]    Hepatic encephalopathy  Pt presented with one day of gait disturbance and AMS. Pt with recent hospitalization (6/24-6/29) for same presentation where CT head and EEG were negative. AMS thought to be 2/2 to HE. Previously, pt not on lactulose  or Xifaxin. Started on lactulose  and rifaxamin upon discharge, however per sister had decreased Bms in the last few days. With significant asterixis on exam.  Other causes of AMS including infection were negative.  Infectious workup consisted of negative blood cultures and UA.  Lactulose  was uptitrated for total of 3 bowel movements daily, patient has steadily improved with increased bowel movements.  There was no pocket of ascitic fluid deemed safe for diagnostic paracentesis.  Home dose of gabapentin  was held until improvement in mental status was noted.  Recommend continuing lactulose  20 g 3 times daily, titrating  -Hold gabapentin  pending improvement in mental status  -Continue lactulose  20g TID, titrate to 3-5 Bms daily  -4 BM 7/23   -Continue Xifaxin 550mg  BID     AV Fistual Clot, RUE  Patient endorsed extreme painfulness of RUE at the location of his fistula. Fistula previously evaluated for stenosis on 5/14 (ruled it OK). Reports of recirculation but no evidence of decreased flows or prolonged bleeding and fistula with thrill, pulsatility, and collapsibility at the time. Circuit study planned for 7/22 but was cancelled. No thrill or bruit detected on exam 01/17/24. Bedside US  performed, identifying possible clot. Nephrology and VIR consulted and PVL ordered, anticipate circuit study. Plan to defer dialysis today 01/17/24. If necessary, tem plans to aces through another line. Eliqiuis BID was held and hepairn gtt begun 7/24.  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 Unfortunately, now with cirrhosis of transplanted liver. Now with second admission for altered mental status likely 2/2 to HE as above. S/p recent biopsy iso transaminitis that was negative for cell mediated rejection. Pathophysiology of HE is likely due to constipation following recent med change to rifaximin  from lactulose  (7/18) and opioid use (for pain PRN following liver biopsy on 6/30). However, will need to rule out fistula pathology and potential infection as source of his HE. AST 128, ALT 105, Alkphos 391 on admission. Ursodiol  started 7/24 per GI recommendation given positive AMA and elevate ALP, although no findings on biopsy to suggest PBC. Hepatology also recommended MRI abdomen with elastography since biopsy did not suggest advanced fibrosis, which is incongruent with his evidence of portal hypertension and varices on imaging, and hx of encephalopathy, presumably HE. Again, could be that HE is multifactorial.  -Ursodiol  started 7/24  -MRI abdomen elastography  -Diag para deferred given no appropriate site seen for sampling and absence of free fluid in all quadrants  -Variceal ppx - amlodipine  10 mg daily restarted given higher BPs  -Continue cellcept  250mg  BID  -Tacrolimus  1mg   BID  -7/23 tac trough within goal range, continue on current regiment  -HE mgmt:              -Lactulose  20g 3x a day (taper to 3-5 bowel movements)              -Rifaxamin 550mg  BID           Atrial flutter - Hx of persistent A FIb  Pt with hx of A fib, however EKG on admission c/w A flutter. Saw PCP outpatient, decided to restart A/C. Previously on coreg  but unable to tolerate iso hypotension as below. Eliquis  5mg  BID stopped; heparin  gtt begun 7/24 for circuit study.  Home carvedilol  was stopped secondary to hypotension.  Commend resuming upon discharge.     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 Pt on TTS dialysis. Previous kidney transplant evaluation closed 09/2023 in the setting of elevated A1C, can be re-referred in six months. Plan for dialysis per patient's schedule. Of note- had VIR study of dialysis circuit that was planned for 7/22, cancelled given admitted to hospital.  Date of last dialysis prior to hospitalization was Saturday 7/19.  Recommend continuing dialysis TTS as usual.     T2DM  A1c on 7/21 10. Long-standing Type 2 Diabetes Mellitus, diagnosed around 2015, post-liver transplant. Recently established with endocrine. Ultimate goal to optimize candidacy for potential renal/liver transplant in the future. Home regimen includes Tresiba  60U nightly, Novolog  25U before meals, Mounjaro  12.5 qWeek. Has CGM, however has trouble interpreting data.  Home Mounjaro  was stopped.  Recommend continuing home medications upon discharge as prescribed by outpatient endocrinology.    HFpEF  Pt with hx of HFpEF, currently not on any diuretics per chart review. ProBNP on admission elevated to 14,682. Last value documented is a BNP from 5/23, 603. No orthopnea, dyspnea on exertion. Not overtly volume overloaded at this time. Will plan for dialysis tomorrow.  -Daily BMP  -Dialysis as above     HTN - Recent hypotension  Pt with hx of uncontrolled HTN on coreg , clonidine, hydralazine . However, presented to OSH with significant hypotension. All anti-hypertensives stopped at that time. On presentation, pt with labile BP measuring 130s-170s. Bp have since recovered.  -Restart amlodipine  10mg  daily  -Hold Coreg  6.25mg  daily        Chronic Problems  Hx DVT - Hold Eliquis  5mg  BID, replace with heparin  gtt  HLD- Pt prescribed atorvastatin  40mg  daily, however reportedly not taking. Held starting on 7/23 due to decompensated cirrhosis.   Hx of OSA- No longer uses CPAP after losing 40 lbs.  CAD- NM spect in 2024 with moderate sized, mild in severity, fixed perfusion defect mid anterior, apical anterior and apical segments. No CP on admission.        Condition at Discharge: {condition:18240}  Discharge Medications:      Your Medication List        ASK your doctor about these medications      amlodipine  10 MG tablet  Commonly known as: NORVASC   Take 1 tablet (10 mg total) by mouth daily.     atorvastatin  40 MG tablet  Commonly known as: LIPITOR   Take 1 tablet (40 mg total) by mouth daily.     carvedilol  6.25 MG tablet  Commonly known as: COREG   Take 1 tablet (6.25 mg total) by mouth two (2) times a day. Patient unsure about dosage     DEXCOM G6 RECEIVER Misc  Generic drug: blood-glucose,receiver,cont  1 each  by Miscellaneous route in the morning. Dispense 1 receiver annually.  Sent to [facility].     DEXCOM G6 TRANSMITTER Devi  Generic drug: blood-glucose transmitter  1 each by Miscellaneous route Every three (3) months. [facility]     DEXCOM G6 TRANSMITTER Devi  Generic drug: blood-glucose transmitter  Use as directed to monitor blood glucose     DEXCOM G7 SENSOR Devi  Generic drug: blood-glucose sensor  Use 1 sensor every 10 days.     ELIQUIS  5 mg Tab  Generic drug: apixaban   Take 1 tablet (5 mg total) by mouth two (2) times a day.     gabapentin  300 MG capsule  Commonly known as: NEURONTIN   Take 1 capsule (300 mg total) by mouth nightly.     insulin  degludec 200 unit/mL (3 mL) Inpn  Commonly known as: TRESIBA  FLEXTOUCH U-200  Inject 0.3 mL (60 Units total) under the skin daily. Max dose of 100 units per day.     lactulose  10 gram/15 mL solution  Take 45 mL (30 g total) by mouth two (2) times a day.     lanthanum  1000 MG chewable tablet  Commonly known as: FOSRENOL   Chew 1 tablet (1,000 mg total) Three (3) times a day with a meal.     MOUNJARO  7.5 mg/0.5 mL Pnij  Generic drug: tirzepatide   Inject 7.5 mg under the skin every seven (7) days.     MOUNJARO  12.5 mg/0.5 mL Pnij  Generic drug: tirzepatide   Inject 12.5 mg under the skin every seven (7) days.     MOUNJARO  15 mg/0.5 mL Pnij  Generic drug: tirzepatide   Inject 15 mg under the skin every seven (7) days.  Start taking on: February 11, 2024     multivitamin per tablet  Commonly known as: TAB-A-VITE/THERAGRAN  Take 1 tablet by mouth daily.     mycophenolate  250 mg capsule  Commonly known as: CELLCEPT   Take 1 capsule (250 mg total) by mouth two (2) times a day.     NovoLOG  Flexpen U-100 Insulin  100 unit/mL (3 mL) injection pen  Generic drug: insulin  aspart  Inject 0.35 mL (35 Units total) under the skin Three (3) times a day before meals.     polyethylene glycol 236-22.74-6.74 gram solution  Commonly known as: GOLYTELY   Take by mouth as directed per [facility] GI prep instructions, for split bowel prep.     PROGRAF  0.5 mg capsule  Generic drug: tacrolimus   Take 2 capsules (1 mg total) by mouth two (2) times a day.     TRUEPLUS PEN NEEDLE 32 gauge x 5/32 (4 mm) Ndle  Generic drug: pen needle, diabetic  Use with insulin  up to 4 times/day as needed.     TRUEPLUS PEN NEEDLE 32 gauge x 5/32 (4 mm) Ndle  Generic drug: pen needle, diabetic  Use with insulin  up to 4 times a day as needed.     VITAMIN D3 ORAL  Take 2,000 Units by mouth daily.     XIFAXAN  550 mg Tab  Generic drug: rifAXIMin   Take 1 tablet (550 mg total) by mouth two (2) times a day.              Pending Test Results:     Pending Labs       Order Current Status    Blood Culture Preliminary result    Blood Culture Preliminary result            Discharge Instructions:       Appointments  which have been scheduled for you      Jan 24, 2024  COLONOSCOPY, FLEXIBLE, PROXIMAL TO SPLENIC FLEXURE; DIAGNOSTIC, W/WO COLLECTION SPECIMEN BY BRUSH OR WASH, UGI ENDOSCOPY; WITH BIOPSY, SINGLE OR MULTIPLE with Prentice CHRISTELLA Feeling, MD  GI PERIOP [facility] REGION) [address]  [place] [address]  [phone]   COLONOSCOPY, FLEXIBLE, PROXIMAL TO SPLENIC FLEXURE; DIAGNOSTIC, W/WO  COLLECTION SPECIMEN BY BRUSH OR WASH None     Feb 12, 2024 11:20 AM  (Arrive by 11:05 AM)  NEW EP with MEDCAR EP FELLOW DR LOREAN  [facility] CARDIOLOGY [place] [facility] REGION) [address]  [facility] [address]  [place] [address]  [phone]        Feb 13, 2024 8:20 AM  (Arrive by 8:05 AM)  RETURN ENDOCRINE with Ponce Arnulfo Kid, MD  [place] [facility] REGION) [address] [place] [address]  [place] [address]  [phone]        Mar 11, 2024 1:00 PM  (Arrive by 12:45 PM)  NEW PODITARY with Kayla Artist Setters, DPM  [facility] PODIATRY [place] [facility] REGION) [address]  [address]  [place] [address]  [phone]                I spent {Time less than/greater uyjw:71078} in the discharge of this patient.

## 2024-01-19 NOTE — Unmapped (Signed)
 HEMODIALYSIS NURSE PROCEDURE NOTE       Treatment Number:  1 Room / Station:  3    Procedure Date:  01/19/24 Device Name/Number: [place]    Total Dialysis Treatment Time:  242 Min.    CONSENT:    Written consent was obtained prior to the procedure and is detailed in the medical record.  Prior to the start of the procedure, a time out was taken and the identity of the patient was confirmed via name, medical record number and date of birth.     WEIGHT:   Date/Time Pre-Treatment Weight (kg) Estimated Dry Weight (kg) Patient Goal Weight (kg) Total Goal Weight (kg)    01/19/24 0803 126.6 kg (279 lb 1.6 oz)  120.5 kg (265 lb 10.5 oz)  4 kg (8 lb 13.1 oz)  4.55 kg (10 lb 0.5 oz)        Date/Time Post-Treatment Weight (kg) Treatment Weight Change (kg)    01/19/24 1230 122 kg (268 lb 15.4 oz)  -4.61 kg      Active Dialysis Orders (168h ago, onward)       Start     Ordered    01/19/24 0720  Hemodialysis inpatient  Every Tue,Thu,Sat      Question Answer Comment   Patient HD Status: Chronic    New Start? No    K+ 3 meq/L    Ca++ 2.5 meq/L    Bicarb 35 meq/L    Na+ 137 meq/L    Na+ Modeling no    Dialyzer [id number]    Dialysate Temperature (C) 36.5    BFR-As tolerated to a maximum of: 500 mL/min    DFR 800 mL/min    Duration of treatment 4 Hr    Dry weight (kg) 120.5    Challenge dry weight (kg) yes    Fluid removal (L) to EDW as tlerated by BP and crit line    Tubing Adult = 142 ml    Access Site Dialysis Catheter    Access Site Location Left    Keep SBP >: 100        01/19/24 0719                  ASSESSMENT:  General appearance: alert and no distress  Neurologic: Grossly normal  Lungs: clear to auscultation bilaterally  Heart: regular rate and rhythm, S1, S2 normal, no murmur, click, rub or gallop  Abdomen: soft, non-tender; bowel sounds normal; no masses,  no organomegaly        ACCESS SITE:       Hemodialysis Catheter 01/18/24 Left Internal jugular (Active)   Site Assessment Clean;Dry;Intact 01/19/24 1230   Proximal Lumen Status / Patency Blood Return - Brisk;Capped;Gentamicin  Citrate Locked 01/19/24 1230   Proximal Lumen Intervention Deaccessed 01/19/24 1230   Medial Lumen Status / Patency Blood Return - Brisk;Capped;Gentamicin  Citrate Locked 01/19/24 1230   Medial Lumen Intervention Deaccessed 01/19/24 1230   Dressing Intervention No intervention needed 01/19/24 1230   Dressing Status      Clean;Dry;Intact/not removed 01/19/24 1230   Verification by X-ray Yes 01/19/24 1230   Site Condition No complications 01/19/24 1230   Dressing Type Hemostatic dressing;Occlusive;Transparent;CHG gel 01/19/24 1230   Dressing Change Due 01/25/24 01/19/24 1230   Line Necessity Reviewed? Y 01/19/24 1230   Line Necessity Indications Yes - Hemodialysis 01/19/24 1230        Arteriovenous Fistula - Vein Graft  Access 11/29/22 1216 Arteriovenous fistula Right Forearm (Active)   Site Assessment Red  01/18/24 2212   AV Fistula Thrill Absent;Bruit Absent 01/18/24 2212   Status Deaccessed 01/18/24 2212   Dressing Intervention New dressing 01/18/24 2212   Dressing Status      No dressing 01/18/24 2212   Site Condition Red/inflamed;Warm to touch;Painful 01/18/24 2212   Dressing Open to air (None) 01/18/24 2212   Dressing Drainage Description Other (Comment) 01/19/23 0201   Dressing Change Due 01/19/24 01/18/24 1730     Catheter fill volumes:    Arterial: 2.1 mL Venous: 2.1 mL   Catheter filled with 1 mg Gentamicin  Citrate post procedure.     Patient Lines/Drains/Airways Status       Active Peripheral & Central Intravenous Access       Name Placement date Placement time Site Days    Peripheral IV 01/15/24 Anterior;Distal;Left;Upper Arm 01/15/24  1503  Arm  3    Hemodialysis Catheter 01/18/24 Left Internal jugular 01/18/24  1634  Internal jugular  less than 1                   LAB RESULTS:  Lab Results   Component Value Date    NA 134 (L) 01/19/2024    K 3.9 01/19/2024    CL 98 01/19/2024    CO2 21.0 01/19/2024    BUN 61 (H) 01/19/2024    CREATININE 16.71 (H) 01/19/2024    GLU 380 (H) 01/19/2024    GLUF 138 10/06/2014    CALCIUM  8.3 (L) 01/19/2024    CAION 4.57 11/20/2022    ICAV 4.88 03/04/2012    PHOS 5.3 (H) 01/19/2024    MG 2.4 01/19/2024    PTH 267.5 (H) 10/14/2021    IRON 95 11/09/2022    LABIRON 36 11/09/2022    TRANSFERRIN 212.0 (L) 11/09/2022    FERRITIN 169.8 11/09/2022    TIBC 267.1 11/09/2022     Lab Results   Component Value Date    WBC 5.1 01/19/2024    HGB 10.7 (L) 01/19/2024    HCT 32.8 (L) 01/19/2024    PLT 177 01/19/2024    PHART 7.36 03/04/2012    PO2ART 88 03/04/2012    PCO2ART 43 03/04/2012    HCO3ART 23.4 03/04/2012    BEART -1.3 03/04/2012    O2SATART 98.1 03/04/2012    PTINR : 05/06/2012    APTT 290.0 (HH) 01/19/2024        VITAL SIGNS:    Date/Time Temp Temp src       01/19/24 1224 37 ??C (98.6 ??F)  Oral        Date/Time Pulse BP MAP (mmHg) Patient Position    01/19/24 1224 83  156/76  post HD  91  --     01/19/24 1200 62  134/63  --  --     01/19/24 1130 60  136/35  --  Sitting     01/19/24 1100 63  164/63  --  Sitting     01/19/24 1030 66  134/59  --  Sitting     01/19/24 1000 67  114/40  --  Sitting     01/19/24 0930 62  143/30  --  Sitting     01/19/24 0900 66  127/60  --  Sitting       Date/Time Blood Volume Change (%) HCT HGB Critline O2 SAT %    01/19/24 1219 -18.4 %  37.4  12.7  44.2     01/19/24 1200 -17 %  36.8  12.5  61.9  01/19/24 1130 -15.5 %  36.2  12.3  65.3     01/19/24 1100 -15.5 %  36.1  12.3  63.2     01/19/24 1030 -15.4 %  36.1  12.3  48.5     01/19/24 1000 -14.3 %  35.6  12.1  60.5     01/19/24 0930 -11.6 %  34.6  11.7  64.4     01/19/24 0900 -8.9 %  33.5  11.4  65.7       Date/Time Resp SpO2 O2 Device FiO2 (%) O2 Flow Rate (L/min)    01/19/24 1200 18  --  --  -- --     01/19/24 1130 18  --  None (Room air)  -- --     01/19/24 1100 18  --  None (Room air)  -- --     01/19/24 1030 18  --  --  -- --     01/19/24 1000 18  --  None (Room air)  -- --     01/19/24 0930 18  --  None (Room air)  -- --     01/19/24 0900 20  --  None (Room air)  -- --         Date/Time Therapy Number Dialyzer Hemodialysis Line Type All Machine Alarms Passed    01/19/24 0803 1  F-180 (98 mLs)  Adult (142 m/s)  Yes       Date/Time Air Detector Saline Line Double Clampled Hemo-Safe Applied Dialysis Flow (mL/min)    01/19/24 0803 Engaged  --  --  800 mL/min       Date/Time Verify Priming Solution Priming Volume Hemodialysis Independent pH Hemodialysis Machine Conductivity (mS/cm)    01/19/24 0803 0.9% NS  300 mL  --  13.5 mS/cm       Date/Time Hemodialysis Independent Conductivity (mS/cm) Bicarb Conductivity Residual Bleach Negative Total Chlorine    01/19/24 0803 13.5 mS/cm  -- No (Comment)  0       Date/Time Pre-Hemodialysis Comments    01/19/24 0803 alert in chair       Date/Time Blood Flow Rate (mL/min) Arterial Pressure (mmHg) Venous Pressure (mmHg) Transmembrane Pressure (mmHg)    01/19/24 1219 150 mL/min  -226 mmHg  158 mmHg  69 mmHg     01/19/24 1200 375 mL/min  -220 mmHg  158 mmHg  59 mmHg     01/19/24 1130 400 mL/min  -232 mmHg  140 mmHg  37 mmHg     01/19/24 1100 400 mL/min  -231 mmHg  152 mmHg  46 mmHg     01/19/24 1030 400 mL/min  -238 mmHg  158 mmHg  49 mmHg     01/19/24 1000 400 mL/min  -232 mmHg  156 mmHg  60 mmHg     01/19/24 0930 400 mL/min  -219 mmHg  158 mmHg  60 mmHg     01/19/24 0900 400 mL/min  -218 mmHg  160 mmHg  63 mmHg     01/19/24 0830 400 mL/min  -198 mmHg  155 mmHg  48 mmHg     01/19/24 0817 400 mL/min  -190 mmHg  150 mmHg  60 mmHg       Date/Time Ultrafiltration Rate (mL/hr) Ultrafiltrate Removed (mL) Dialysate Flow Rate (mL/min) KECN (Kecn)    01/19/24 1219 1480 mL/hr  4550 mL  800 ml/min  --     01/19/24 1200 1140 mL/hr  4320 mL  800 ml/min  --     01/19/24 1130 1150 mL/hr  3635 mL  800 ml/min  --     01/19/24 1100 1150 mL/hr  3009 mL  800 ml/min  --     01/19/24 1030 1150 mL/hr  2443 mL  800 ml/min  --     01/19/24 1000 1150 mL/hr  1915 mL  800 ml/min  --     01/19/24 0930 1140 mL/hr  1404 mL  800 ml/min  --     01/19/24 0900 1140 mL/hr  750 mL  800 ml/min  --     01/19/24 0830 1140 mL/hr  306 mL  800 ml/min  --     01/19/24 0817 1010 mL/hr  0 mL  800 ml/min  --       Date/Time Intra-Hemodialysis Comments    01/19/24 1219 rinse blood back     01/19/24 1200 aslepp, VSS     01/19/24 1130 stable, asleep     01/19/24 1100 VSS, asleep     01/19/24 1030 placed blankets VSS     01/19/24 1000 asleep , VSS     01/19/24 0930 aslep in HD recliner     01/19/24 0900 STable. asleep     01/19/24 0830 DR Ciocca rounding, no chnages     01/19/24 0817 started per protocol       Date/Time Rinseback Volume (mL) On Line Clearance: spKt/V Total Liters Processed (L/min) Dialyzer Clearance    01/19/24 1230 300 mL  0.89 spKt/V  88 L/min  Moderately streaked       Date/Time Post-Hemodialysis Comments    01/19/24 1230 stable Post       Date/Time Total Hemodialysis Replacement Volume (mL) Total Ultrafiltrate Output (mL)    01/19/24 1230 --  4000 mL        7112-7112-01 - Medicaitons Given During Treatment  (last 4 hrs)           MORRISON, CONSTANCE, MD         Medication Name Action Time Action Route Rate Dose User     [Provider Hold] apixaban  (ELIQUIS ) tablet 5 mg On hold since Thu 01/17/2024 at 0815 until manually unheld; held by [name] Griffes, MDHold Reason: Pre-procedure 01/19/24 0900 Provider Hold Dose Oral   [name] Griffes, MD            Omarrion Carmer H, RN         Medication Name Action Time Action Route Rate Dose User     gentamicin -sodium citrate  lock solution in NS 01/19/24 1218 Given Intra-cannular  2.1 mL Luella Mliss DEL, RN     gentamicin -sodium citrate  lock solution in NS 01/19/24 1218 Given Intra-cannular  2.1 mL Luella Mliss DEL, RN     mycophenolate  (CELLCEPT ) capsule 250 mg 01/19/24 9078 Given Oral  250 mg Andres Escandon H, RN     tacrolimus  (PROGRAF ) capsule 1 mg 01/19/24 9078 Given Oral  1 mg Jatniel Verastegui H, RN            PATEL, MARLIN SQUIBB, RN         Medication Name Action Time Action Route Rate Dose User     insulin  lispro (HumaLOG ) inj PERCENTAGE MEAL EATEN 5 Units 01/19/24 1002 Not Given Subcutaneous  5 Units Tobie MARLIN P, RN     lactulose  (CEPHULAC ) packet 20 g 01/19/24 1010 Not Given Oral  20 g Tobie MARLIN SQUIBB, RN     lidocaine  (ASPERCREME) 4 % 1 patch 01/19/24 1046 Not Given Transdermal  1 patch Tobie MARLIN SQUIBB, RN  WOODS, ARTIST DASEN, MD         Medication Name Action Time Action Route Rate Dose User     [Provider Hold] atorvastatin  (LIPITOR ) tablet 40 mg On hold since Wed 01/16/2024 at 0726 until manually unheld; held by Milissa ARTIST DASEN, MDHold Reason: Abnormal lab 01/19/24 0900 Provider Hold Dose Oral   Milissa ARTIST DASEN, MD                      Patient tolerated treatment in a  Dialysis Recliner.

## 2024-01-19 NOTE — Unmapped (Signed)
 Internal Medicine (MEDW) Progress Note    Assessment & Plan:   Richard [name] is a 58 y.o. 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     Principal Problem:    Hepatic encephalopathy     Active Problems:    Hypertension    Type 2 diabetes mellitus with hyperglycemia, with long-term current use of insulin        H/O gastroesophageal reflux (GERD)    Liver replaced by transplant       Obstructive sleep apnea syndrome    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)    (HFpEF) heart failure with preserved ejection fraction       CAD (coronary artery disease)    Cirrhosis of transplanted liver         Active Problems  AMS likely 2/2 to HE  Pt presented with one day of gait disturbance and AMS. Pt with recent hospitalization (6/24-6/29) for same presentation where CT head and EEG were negative. AMS thought to be 2/2 to HE. Previously, pt not on lactulose  or Xifaxin. Started on lactulose  and rifaxamin since then, however per sister had decreased Bms in the last few days. With significant asterixis on exam. Will r/o other causes of AMS with infectious w/up. Patient had 9 BM On 7/24. Current regimen of lactulose  and Xifaxin appropriate at this time. Will continue to uptitrate lactulose  as needed.  Patient improving with increased bowel movements. No fluid pocket available for paracentesis.  -Continue lactulose  20g TID, titrate to 3-5 Bms daily  -Continue Xifaxin 550mg  BID  -Delirium precautions   -Bcx NGTD, UA pending  -Ursodiol  600 mg twice daily    AV Fistual Clot, RUE  Patient endorsed extreme painfulness of RUE at the location of his fistula. Fistula previously evaluated for stenosis on 5/14 (ruled it OK). Reports of recirculation but no evidence of decreased flows or prolonged bleeding and fistula with thrill, pulsatility, and collapsibility at the time. No thrill or bruit detected on exams since admission. PVL preformed finding no evidence of obstruction in the central veins. Evidence of acute obstruction visualized in the brachiocephalic AVF. All other venous findings in the upper extremity are within normal limits. VIR attempted to remove clot though unsuccessful, HD line placed 7/25.  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 Unfortunately, now with cirrhosis of transplanted liver. Now with second admission for altered mental status likely 2/2 to HE as above. S/p recent biopsy iso transaminitis that was negative for cell mediated rejection. Pathophysiology of HE is likely due to constipation following recent med change to rifaximin  from lactulose  (7/18) and opioid use (for pain PRN following liver biopsy on 6/30). AST 128, ALT 105, Alkphos 391 on admission. Ursodiol  started 7/24 per GI recommendation given positive AMA and elevate ALP, although no findings on biopsy to suggest PBC.  Will pursue MRI abdomen with elastography given biopsy did not suggest advanced cirrhosis although clinical suspicion still elevated  -Ursodiol  started 7/24  -MRI abdomen elastography ordered  -Variceal ppx - carvedilol  6.25mg  BID restarted  -Continue  cellcept  250mg  BID  -Tacrolimus  1mg  BID  -7/26 trough above therapeutic range, although this was drawn 4 hours early  - Repeat trough    MELD 3.0: 18 at 01/17/2024  9:36 AM  MELD-Na: 20 at 01/17/2024  9:36 AM  Calculated from:  Serum Creatinine: On dialysis. Using the maximum value.  Serum Sodium: 139 mmol/L (Using max of 137 mmol/L) at 01/17/2024  9:36 AM  Total Bilirubin: 0.9 mg/dL (Using min of 1 mg/dL) at 2/75/7974  0:63 AM  Serum Albumin: 3.1 g/dL at 2/75/7974  0:63 AM  INR(ratio): 0.98 (Using min of 1) at 01/15/2024  3:02 PM  Age at listing (hypothetical): 58 years  Sex: Male at 01/17/2024  9:36 AM    Atrial flutter - Hx of persistent A FIb  Pt with hx of A fib, however EKG on admission c/w A flutter. Saw PCP outpatient, decided to restart A/C. Previously on coreg  but unable to tolerate iso hypotension as below.  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 Pt on TTS dialysis. Previous kidney transplant evaluation closed 09/2023 in the setting of elevated A1C, can be re-referred in six months. Plan for dialysis per patient's schedule. Of note- had VIR study of dialysis circuit that was planned for 7/22, cancelled given admitted to hospital.  Date of last dialysis 7/19.   - HD performed 7/26 with 4 L UF removed using temporary HD catheter  -BMP daily      T2DM  A1c on 7/21 10. Long-standing Type 2 Diabetes Mellitus, diagnosed 2015, post-liver transplant. Recently established with endocrine. Ultimate goal to optimize candidacy for potential renal/liver transplant in the future. Home regimen includes Tresiba  60U nightly, Novolog  25U before meals, Mounjaro  12.5 qWeek. Has CGM, however has trouble interpreting data. Glucose was elevated at 290 after starting 10 units NPH morning/night and 5 units lispro TID with meals. Changed insulin  regimen to the following:  - 60 units glargine each evening, 10 units lispro with meals  -Hypoglycemia protocol  -Hold home Mounjaro   - Gabapentin  300 mg nightly    Gastric reflux  Patient with persistent feelings of gastric reflux, particularly while laying flat.  -Famotidine  20 mg twice daily     HFpEF, NYHA class I  Pt with hx of HFpEF, currently not on any diuretics per chart review. ProBNP on admission elevated to 14,682. Last value documented is a BNP from 5/23, 603. No orthopnea, dyspnea on exertion. Not overtly volume overloaded at this time. Will plan for dialysis Saturday 7/26 with line access if AVF not available. No indications for emergent dialysis as of now. .  Last echo May/2024.  Myocardial perfusion scan December/2024 with LVEF >60%   -Daily BMP  -Dialysis as above     HTN - Recent hypotension  Pt with hx of uncontrolled HTN on coreg , clonidine, hydralazine . However, presented to OSH with significant hypotension. All anti-hypertensives stopped at that time. On presentation, pt with labile BP measuring 130s-170s. Bp have since recovered and have been consistently elevated. Patient has been restarted on home amlodipine  and coreg .   -Continue amlodipine  10mg  daily, Coreg  6.25mg  BID daily    Chronic Problems  Hx DVT - Hold Eliquis  5mg  BID for potential intervention  HLD- Pt prescribed atorvastatin  40mg  daily, however reportedly not taking. Held starting on 7/23 due to decompensated  cirrhosis.   Hx of OSA- No longer uses CPAP after losing 40 lbs.  CAD- NM spect in 2024 with moderate sized, mild in severity, fixed perfusion defect mid anterior, apical anterior and apical segments. No CP on admission.    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    Medical Decision Making: Reviewed records from the following unique sources  EPIC, sister.    Daily Checklist:  Diet: Regular Diet  DVT PPx: Patient Already on Full Anticoagulation with eliquis  5mg  BID  Electrolytes: No Repletion Needed  Code Status: Full Code  Dispo:  Patient appropriate for Observation based on expectation at time of admission that period of observation will last less than two midnights    Team Contact Information:   Primary Team: Internal Medicine (MEDW)  Primary Resident: Artist Ill, MD  M3: Artist ONEIDA Ill, MD  Resident's Pager: [phone] (Gen MedW Intern - [place])    Interval History:   Patient with persistent right upper extremity pain overnight.  He does not tolerate p.o. Dilaudid  as it makes him vomit.  He complains of continued heartburn symptoms.    ROS: Denies headache, chest pain, shortness of breath, abdominal pain, nausea, vomiting.    Objective:   Temp:  [36.1 ??C (97 ??F)-37.6 ??C (99.7 ??F)] 36.7 ??C (98.1 ??F)  Pulse:  [56-98] 56  SpO2 Pulse:  [54-67] 62  Resp:  [17-30] 17  BP: (114-188)/(30-108) 135/72  SpO2:  [94 %-100 %] 98 %    Gen: NAD, Aox3  HENT: atraumatic, normocephalic, wound from liver bx line on left neck  Heart: RRR  Lungs: CTAB, no crackles or wheezes  Abdomen: soft, NTND, ascites  Extremities: No edema, warmth and more erythematous spot on dialysis fistula on RUE with tenderness to touch      Artist ONEIDA Ill, MD

## 2024-01-19 NOTE — Unmapped (Signed)
 Tacrolimus  Therapeutic Monitoring Pharmacy Note    Richard [name] is a 58 y.o. male continuing tacrolimus .     Indication: Liver transplant     Date of Transplant: 02/2012      Prior Dosing Information: Home regimen 1 mg BID     Source(s) of information used to determine prior to admission dosing: Clinic Note    Goals:  Therapeutic Drug Levels  Tacrolimus  trough goal: 2-4 ng/ml    Additional Clinical Monitoring/Outcomes  Monitor renal function (SCr and urine output) and liver function (LFTs)  Monitor for signs/symptoms of adverse events (e.g., hyperglycemia, hyperkalemia, hypomagnesemia, hypertension, headache, tremor)    Previous Lab Values  Tacrolimus , Trough   Date/Time Value Ref Range Status   01/18/2024 03:51 AM 7.0 5.0 - 15.0 ng/mL Final   01/16/2024 06:02 AM 5.2 5.0 - 15.0 ng/mL Final   12/04/2022 06:32 AM 5.2 5.0 - 15.0 ng/mL Final   12/01/2022 07:02 AM 3.0 (L) 5.0 - 15.0 ng/mL Final   11/21/2022 07:31 AM 3.7 (L) 5.0 - 15.0 ng/mL Final   10/06/2014 09:00 AM 6.8 <=20.0 ng/mL Final   08/31/2014 08:34 AM 4.2  Final   08/03/2014 08:22 AM 4.7  Final   07/07/2014 08:37 AM 4.2  Final   06/09/2014 08:27 AM 3.5  Final     Tacrolimus , Timed   Date/Time Value Ref Range Status   01/19/2024 08:24 AM 5.3 ng/mL Final   01/11/2024 09:43 AM 8.9 ng/mL Final   12/24/2023 10:23 AM 18.1 ng/mL Final   12/04/2023 12:54 PM 3.6 ng/mL Final   11/29/2022 05:36 AM 5.0 ng/mL Final       Result:  Tacrolimus  level from today was drawn ~ 8h after last dose     Pharmacokinetic Considerations and Significant Drug Interactions:  Concurrent CYP3A4 substrates/inhibitors: amlodipine  10mg  (home med)    Assessment/Plan:  7/25 - tacro level drawn too early -- true trough should be closer to goal, unable to fully assess for true trough.  7/26 - tacro level drawn too early.    Recommendedation(s)  Continue current regimen of  Tacrolimus  1 mg BID   Recheck tacrolimus  level closer to a trough time    Follow-up  Continue MWF levels ordered.   A pharmacist will continue to monitor and recommend levels as appropriate    Please page service pharmacist with questions/clarifications.    Delmo Matty K Camari Quintanilla, PharmD

## 2024-01-19 NOTE — Unmapped (Signed)
 Shift Summary  Patient admitted for hepatic encephalopathy.  Tolerated all PO and subcutaneous medications well as well as tolerating heparin  gtt well.  Vital signs stable on room air.  Patient ambulates around room and outside of room well. Refuses the bed alarm even after explaining it is for his safety. He stated you need to turn that off, I am going to get up any ways. It isn't going to work. Speaking of the bed alarm.  A tunneled hemodialysis catheter was inserted after an unsuccessful thrombectomy procedure.    FentaNYL  was administered in PROCEDURAL PACU 2ND FL UNCAD PRN for pain management which did not seem to relieve the pain.  Patient refused any medication for pain, including tylenol  and oxycodone .    Insulin  NPH was administered to address high blood glucose levels.    Pain in the right arm worsened, but interventions were declined.    Overall, the patient experienced increased pain and elevated blood pressure and glucose levels during the shift.     Optimal Comfort and Wellbeing: Insulin  NPH was administered to address elevated glucose levels.     Optimal Pain Control and Function: Pain in the right arm worsened from a 9 to a 10 on the pain scale, with descriptors changing from stabbing to sharp. Despite the worsening pain, interventions were declined.     Absence of Fall and Fall-Related Injury: Bed wheels remained locked and non-skid footwear was consistently worn throughout the shift. Hourly visual checks confirmed the patient was in bed, either awake or with eyes closed.     Optimal Pain Control and Function: Pain in the right arm worsened from a 9 to a 10 on the pain scale, with descriptors changing from stabbing to sharp. Despite the worsening pain, interventions were declined.

## 2024-01-19 NOTE — Unmapped (Signed)
 [facility] Nephrology Hemodialysis Procedure Note     01/19/2024    Richard [name] was seen and examined on hemodialysis    CHIEF COMPLAINT: End Stage Renal Disease    INTERVAL HISTORY: Patient admitted for AMS concern for hepatic encephalopathy. Underwent attempted declot of AVF yesterday but unfortunately unable to establish flow and further attempts felt to be unlikely to succeed.     DIALYSIS TREATMENT DATA:        Pre-Treatment Weight (kg): 126.6 kg (279 lb 1.6 oz)    Dialysis Bath  Bath: 3 K+ / 2.5 Ca+  Dialysate Na (mEq/L): 137 mEq/L  Dialysate HCO3 (mEq/L): 35 mEq/L Dialyzer: F-180 (98 mLs)   Blood Flow Rate (mL/min): 400 mL/min     Machine Temperature (C): 36.5 ??C (97.7 ??F)      PHYSICAL EXAM:  Vitals:  Temp:  [36.1 ??C (97 ??F)-37.6 ??C (99.7 ??F)] 37.5 ??C (99.5 ??F)  Pulse:  [56-98] 63  SpO2 Pulse:  [54-67] 62  BP: (134-188)/(69-108) 143/69  MAP (mmHg):  [106-127] 107    General: fatigued, currently dialyzing in a Hemodialysis Recliner  Pulmonary: normal respiratory effort  Cardiovascular: regular rate and rhythm  Extremities: 1+  edema  Access: Left IJ tunneled catheter     LAB DATA:  Lab Results   Component Value Date    NA 135 01/18/2024    K 3.7 01/18/2024    CL 101 01/18/2024    CO2 21.0 01/18/2024    BUN 54 (H) 01/18/2024    CREATININE 15.56 (H) 01/18/2024    GLUCOSE 196 03/03/2012    CALCIUM  9.0 01/18/2024    MG 2.6 01/18/2024    PHOS 4.9 01/18/2024    ALBUMIN 3.4 01/18/2024      Lab Results   Component Value Date    HCT 38.8 (L) 01/18/2024    WBC 6.6 01/18/2024        ASSESSMENT/PLAN:  End Stage Renal Disease on Intermittent Hemodialysis:  UF goal: 4L as tolerated  Adjust medications for a GFR <10  Avoid nephrotoxic agents  Last HD Treatment:Started (01/19/24)     Bone Mineral Metabolism:  Lab Results   Component Value Date    CALCIUM  9.0 01/18/2024    CALCIUM  9.0 01/17/2024    Lab Results   Component Value Date    ALBUMIN 3.4 01/18/2024    ALBUMIN 3.1 (L) 01/17/2024      Lab Results   Component Value Date PHOS 4.9 01/18/2024    PHOS 4.4 01/17/2024    Lab Results   Component Value Date    PTH 267.5 (H) 10/14/2021      Labs appropriate, no changes.    Anemia:   Lab Results   Component Value Date    HGB 12.6 (L) 01/18/2024    HGB 12.7 (L) 01/17/2024    HGB 12.5 (L) 01/16/2024    Iron Saturation (%)   Date Value Ref Range Status   11/09/2022 36 % Final   05/15/2012 39 20 - 50 % Final      Lab Results   Component Value Date    FERRITIN 169.8 11/09/2022       Anemia labs appropriate, no changes.    Vascular Access:  Vascular Access functioning well - no need for intervention  S/p unsuccessful declot. Will need to discuss with IR but appears will need revision/new permanent access established  Blood Flow Rate (mL/min): 400 mL/min    IV Antibiotics to be administered at discharge:  TBD  This procedure was fully reviewed with the patient and/or their decision-maker. The risks, benefits, and alternatives were discussed prior to the procedure. All questions were answered and written informed consent was obtained.    Montie PARAS Denu-Ciocca, MD  [facility] Division of Nephrology & Hypertension

## 2024-01-20 LAB — CBC
HEMATOCRIT: 33.2 % — ABNORMAL LOW (ref 39.0–48.0)
HEMOGLOBIN: 11.3 g/dL — ABNORMAL LOW (ref 12.9–16.5)
MEAN CORPUSCULAR HEMOGLOBIN CONC: 34 g/dL (ref 32.0–36.0)
MEAN CORPUSCULAR HEMOGLOBIN: 29.5 pg (ref 25.9–32.4)
MEAN CORPUSCULAR VOLUME: 87 fL (ref 77.6–95.7)
MEAN PLATELET VOLUME: 8.8 fL (ref 6.8–10.7)
PLATELET COUNT: 157 10*9/L (ref 150–450)
RED BLOOD CELL COUNT: 3.81 10*12/L — ABNORMAL LOW (ref 4.26–5.60)
RED CELL DISTRIBUTION WIDTH: 14.1 % (ref 12.2–15.2)
WBC ADJUSTED: 4.3 10*9/L (ref 3.6–11.2)

## 2024-01-20 LAB — COMPREHENSIVE METABOLIC PANEL
ALBUMIN: 2.9 g/dL — ABNORMAL LOW (ref 3.4–5.0)
ALKALINE PHOSPHATASE: 374 U/L — ABNORMAL HIGH (ref 46–116)
ALT (SGPT): 16 U/L (ref 10–49)
ANION GAP: 13 mmol/L (ref 5–14)
AST (SGOT): 8 U/L (ref <=34)
BILIRUBIN TOTAL: 0.7 mg/dL (ref 0.3–1.2)
BLOOD UREA NITROGEN: 27 mg/dL — ABNORMAL HIGH (ref 9–23)
BUN / CREAT RATIO: 3
CALCIUM: 8.9 mg/dL (ref 8.7–10.4)
CHLORIDE: 102 mmol/L (ref 98–107)
CO2: 26 mmol/L (ref 20.0–31.0)
CREATININE: 10.8 mg/dL — ABNORMAL HIGH (ref 0.73–1.18)
EGFR CKD-EPI (2021) MALE: 5 mL/min/1.73m2 — ABNORMAL LOW (ref >=60)
GLUCOSE RANDOM: 189 mg/dL — ABNORMAL HIGH (ref 70–179)
POTASSIUM: 3.7 mmol/L (ref 3.4–4.8)
PROTEIN TOTAL: 6.3 g/dL (ref 5.7–8.2)
SODIUM: 141 mmol/L (ref 135–145)

## 2024-01-20 LAB — TACROLIMUS LEVEL, TROUGH: TACROLIMUS, TROUGH: 6.1 ng/mL (ref 5.0–15.0)

## 2024-01-20 LAB — MAGNESIUM: MAGNESIUM: 2.3 mg/dL (ref 1.6–2.6)

## 2024-01-20 LAB — PHOSPHORUS: PHOSPHORUS: 5.3 mg/dL — ABNORMAL HIGH (ref 2.4–5.1)

## 2024-01-20 MED ADMIN — insulin glargine (LANTUS) injection BASAL 60 Units: 60 [IU] | SUBCUTANEOUS | @ 02:00:00

## 2024-01-20 MED ADMIN — cephalexin (KEFLEX) capsule 500 mg: 500 mg | ORAL | @ 17:00:00 | Stop: 2024-01-25

## 2024-01-20 MED ADMIN — HYDROmorphone (DILAUDID) injection Syrg 0.5 mg: .5 mg | INTRAVENOUS | @ 06:00:00 | Stop: 2024-01-20

## 2024-01-20 MED ADMIN — HYDROmorphone (DILAUDID) injection Syrg 0.5 mg: .5 mg | INTRAVENOUS | @ 20:00:00 | Stop: 2024-02-02

## 2024-01-20 MED ADMIN — mycophenolate (CELLCEPT) capsule 250 mg: 250 mg | ORAL | @ 02:00:00

## 2024-01-20 MED ADMIN — oxyCODONE (ROXICODONE) 5 mg/5 mL solution 10 mg: 10 mg | ORAL | @ 18:00:00 | Stop: 2024-02-03

## 2024-01-20 MED ADMIN — insulin lispro (HumaLOG) injection CORRECTIONAL 0-20 Units: 0-20 [IU] | SUBCUTANEOUS | @ 02:00:00

## 2024-01-20 MED ADMIN — pantoprazole (Protonix) EC tablet 40 mg: 40 mg | ORAL | @ 17:00:00

## 2024-01-20 MED ADMIN — rifAXIMin (XIFAXAN) tablet 550 mg: 550 mg | ORAL | @ 13:00:00 | Stop: 2024-01-30

## 2024-01-20 MED ADMIN — amlodipine (NORVASC) tablet 10 mg: 10 mg | ORAL | @ 13:00:00

## 2024-01-20 MED ADMIN — insulin lispro (HumaLOG) inj PERCENTAGE MEAL EATEN 10 Units: 10 [IU] | SUBCUTANEOUS | @ 13:00:00

## 2024-01-20 MED ADMIN — lactulose (CEPHULAC) packet 20 g: 20 g | ORAL | @ 02:00:00

## 2024-01-20 MED ADMIN — carvedilol (COREG) tablet 6.25 mg: 6.25 mg | ORAL | @ 13:00:00

## 2024-01-20 MED ADMIN — mycophenolate (CELLCEPT) capsule 250 mg: 250 mg | ORAL | @ 12:00:00

## 2024-01-20 MED ADMIN — HYDROmorphone (DILAUDID) injection Syrg 0.5 mg: .5 mg | INTRAVENOUS | @ 02:00:00 | Stop: 2024-02-02

## 2024-01-20 MED ADMIN — insulin lispro (HumaLOG) inj PERCENTAGE MEAL EATEN 10 Units: 10 [IU] | SUBCUTANEOUS | @ 18:00:00

## 2024-01-20 MED ADMIN — tacrolimus (PROGRAF) capsule 1 mg: 1 mg | ORAL | @ 02:00:00

## 2024-01-20 MED ADMIN — insulin lispro (HumaLOG) injection CORRECTIONAL 0-20 Units: 0-20 [IU] | SUBCUTANEOUS | @ 21:00:00

## 2024-01-20 MED ADMIN — ursodiol (ACTIGALL) capsule 600 mg: 600 mg | ORAL | @ 02:00:00

## 2024-01-20 MED ADMIN — rifAXIMin (XIFAXAN) tablet 550 mg: 550 mg | ORAL | @ 02:00:00 | Stop: 2024-01-30

## 2024-01-20 MED ADMIN — HYDROmorphone (DILAUDID) injection Syrg 0.5 mg: .5 mg | INTRAVENOUS | @ 14:00:00 | Stop: 2024-01-20

## 2024-01-20 MED ADMIN — HYDROmorphone (DILAUDID) injection Syrg 0.5 mg: .5 mg | INTRAVENOUS | @ 11:00:00 | Stop: 2024-01-20

## 2024-01-20 MED ADMIN — lactulose (CEPHULAC) packet 20 g: 20 g | ORAL | @ 18:00:00

## 2024-01-20 MED ADMIN — famotidine (PEPCID) tablet 20 mg: 20 mg | ORAL | @ 13:00:00

## 2024-01-20 MED ADMIN — HYDROmorphone (DILAUDID) injection Syrg 0.5 mg: .5 mg | INTRAVENOUS | Stop: 2024-02-02

## 2024-01-20 MED ADMIN — gabapentin (NEURONTIN) capsule 300 mg: 300 mg | ORAL | @ 02:00:00

## 2024-01-20 MED ADMIN — lactulose (CEPHULAC) packet 20 g: 20 g | ORAL | @ 13:00:00

## 2024-01-20 MED ADMIN — tacrolimus (PROGRAF) capsule 1 mg: 1 mg | ORAL | @ 12:00:00

## 2024-01-20 MED ADMIN — famotidine (PEPCID) tablet 20 mg: 20 mg | ORAL | @ 02:00:00

## 2024-01-20 MED ADMIN — insulin lispro (HumaLOG) injection CORRECTIONAL 0-20 Units: 0-20 [IU] | SUBCUTANEOUS | @ 17:00:00

## 2024-01-20 MED ADMIN — ursodiol (ACTIGALL) capsule 600 mg: 600 mg | ORAL | @ 13:00:00

## 2024-01-20 MED ADMIN — insulin lispro (HumaLOG) injection CORRECTIONAL 0-20 Units: 0-20 [IU] | SUBCUTANEOUS | @ 12:00:00

## 2024-01-20 NOTE — Unmapped (Signed)
 Internal Medicine (MEDW) Progress Note    Assessment & Plan:   Richard [name] is a 58 y.o. 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     Principal Problem:    Hepatic encephalopathy     Active Problems:    Hypertension    Type 2 diabetes mellitus with hyperglycemia, with long-term current use of insulin        H/O gastroesophageal reflux (GERD)    Liver replaced by transplant       Obstructive sleep apnea syndrome    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)    (HFpEF) heart failure with preserved ejection fraction       CAD (coronary artery disease)    Cirrhosis of transplanted liver         Active Problems    AV Fistual Clot, RUE  C/f Thrombophlebitis  Patient endorsed extreme painfulness of RUE at the location of his fistula. Fistula previously evaluated for stenosis on 5/14 (ruled it OK). Reports of recirculation but no evidence of decreased flows or prolonged bleeding and fistula with thrill, pulsatility, and collapsibility at the time. No thrill or bruit detected on exams since admission. PVL preformed finding no evidence of obstruction in the central veins. Evidence of acute obstruction visualized in the brachiocephalic AVF. All other venous findings in the upper extremity are within normal limits. VIR attempted to remove clot though unsuccessful, HD line placed 7/25.  Dialyzed 7/26 through temporary line.  Patient with persistent pain, erythema, warmth in right AC on exam.  Consistent with thrombophlebitis versus cellulitis.  Will empirically treat with Keflex  for 5 days.  Patient able be discharged with temporary dialysis line in place with plan for outpatient correction of AV fistula.  We will need to find an oral pain regimen that works for the patient.  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 Unfortunately, now with cirrhosis of transplanted liver. Now with second admission for altered mental status likely 2/2 to HE as above. S/p recent biopsy iso transaminitis that was negative for cell mediated rejection. Pathophysiology of HE is likely due to constipation following recent med change to rifaximin  from lactulose  (7/18) and opioid use (for pain PRN following liver biopsy on 6/30). AST 128, ALT 105, Alkphos 391 on admission. Ursodiol  started 7/24 per GI recommendation given positive AMA and elevate ALP, although no findings on biopsy to suggest PBC.  Will pursue MRI abdomen with elastography given biopsy did not suggest advanced cirrhosis although clinical suspicion still elevated  -Ursodiol  started 7/24  -MRI abdomen elastography ordered, can be done outpatient  -Variceal ppx - carvedilol  6.25mg  BID restarted  -Continue cellcept  250mg  BID  -Tacrolimus  1mg  BID  -7/26 trough above therapeutic range, although this was drawn 4 hours early  - Repeat trough pending    MELD 3.0: 18 at 01/17/2024  9:36 AM  MELD-Na: 20 at 01/17/2024  9:36 AM  Calculated from:  Serum Creatinine: On dialysis. Using the maximum value.  Serum Sodium: 139 mmol/L (Using  max of 137 mmol/L) at 01/17/2024  9:36 AM  Total Bilirubin: 0.9 mg/dL (Using min of 1 mg/dL) at 2/75/7974  0:63 AM  Serum Albumin: 3.1 g/dL at 2/75/7974  0:63 AM  INR(ratio): 0.98 (Using min of 1) at 01/15/2024  3:02 PM  Age at listing (hypothetical): 58 years  Sex: Male at 01/17/2024  9:36 AM    AMS-resolved  Hepatic encephalopathy-resolved  Pt presented with one day of gait disturbance and AMS. Pt with recent hospitalization (6/24-6/29) for same presentation where CT head and EEG were negative. AMS thought to be 2/2 to HE. Previously, pt not on lactulose  or Xifaxin. Started on lactulose  and rifaxamin since then, however per sister had decreased Bms in the last few days. With significant asterixis on exam. Will r/o other causes of AMS with infectious w/up. Current regimen of lactulose  and Xifaxin appropriate at this time. Will continue to uptitrate lactulose  as needed.  Patient improving with increased bowel movements. No fluid pocket available for paracentesis.  -Continue lactulose  20g TID, titrated at 3-5 BM daily  -Continue Xifaxin 550mg  BID  -Delirium precautions   -Bcx NGTD, UA pending  -Ursodiol  600 mg twice daily    Atrial flutter - Hx of persistent A FIb  Pt with hx of A fib, however EKG on admission c/w A flutter. Saw PCP outpatient, decided to restart A/C. Previously on coreg  but unable to tolerate iso hypotension as below.  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 Pt on TTS dialysis. Previous kidney transplant evaluation closed 09/2023 in the setting of elevated A1C, can be re-referred in six months. Plan for dialysis per patient's schedule. Of note- had VIR study of dialysis circuit that was planned for 7/22, cancelled given admitted to hospital.  Date of last dialysis 7/19.   - HD performed 7/26 with 4 L UF removed using temporary HD catheter  -BMP daily      T2DM  A1c on 7/21 10. Long-standing Type 2 Diabetes Mellitus, diagnosed 2015, post-liver transplant. Recently established with endocrine. Ultimate goal to optimize candidacy for potential renal/liver transplant in the future. Home regimen includes Tresiba  60U nightly, Novolog  25U before meals, Mounjaro  12.5 qWeek. Has CGM, however has trouble interpreting data. Glucose was elevated at 290 after starting 10 units NPH morning/night and 5 units lispro TID with meals. Changed insulin  regimen to the following:  - 60 units glargine each evening, 10 units lispro with meals  -Hypoglycemia protocol  -Hold home Mounjaro   - Gabapentin  300 mg nightly    Gastric reflux  Patient with persistent feelings of gastric reflux, particularly while laying flat.  Pain not worse while ambulating.  -Famotidine  20 mg twice daily  -Pantoprazole  40 mg daily     HFpEF, NYHA class I  Pt with hx of HFpEF, currently not on any diuretics per chart review. ProBNP on admission elevated to 14,682. Last value documented is a BNP from 5/23, 603. No orthopnea, dyspnea on exertion. Not overtly volume overloaded at this time. Will plan for dialysis Saturday 7/26 with line access if AVF not available. No indications for emergent dialysis as of now. .  Last echo May/2024.  Myocardial perfusion scan December/2024 with LVEF >60%   -Daily BMP  -Dialysis as above     HTN - Recent hypotension  Pt with hx of  uncontrolled HTN on coreg , clonidine, hydralazine . However, presented to OSH with significant hypotension. All anti-hypertensives stopped at that time. On presentation, pt with labile BP measuring 130s-170s. Bp have since recovered and have been consistently elevated. Patient has been restarted on home amlodipine  and coreg .   -Continue amlodipine  10mg  daily, Coreg  6.25mg  BID daily    Chronic Problems  Hx DVT - Hold Eliquis  5mg  BID for potential intervention  HLD- Pt prescribed atorvastatin  40mg  daily, however reportedly not taking. Held starting on 7/23 due to decompensated cirrhosis.   Hx of OSA- No longer uses CPAP after losing 40 lbs.  CAD- NM spect in 2024 with moderate sized, mild in severity, fixed perfusion defect mid anterior, apical anterior and apical segments. No CP on admission.    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    Medical Decision Making: Reviewed records from the following unique sources  EPIC, sister.    Daily Checklist:  Diet: Regular Diet  DVT PPx: Patient Already on Full Anticoagulation with eliquis  5mg  BID  Electrolytes: No Repletion Needed  Code Status: Full Code  Dispo:  Patient appropriate for Observation based on expectation at time of admission that period of observation will last less than two midnights    Team Contact Information:   Primary Team: Internal Medicine (MEDW)  Primary Resident: Artist Ill, MD  M3: Artist ONEIDA Ill, MD  Resident's Pager: [phone] (Gen MedW Intern - [place]olee)    Interval History:   Patient with persistent right upper extremity pain overnight.  He does not tolerate p.o. Dilaudid  as it makes him vomit.  He complains of continued heartburn symptoms. He is willing to try oral solution oxycodone .    ROS: Denies headache, chest pain, shortness of breath, abdominal pain, nausea, vomiting.    Objective:   Temp:  [36.6 ??C (97.9 ??F)-37 ??C (98.6 ??F)] 36.6 ??C (97.9 ??F)  Pulse:  [56-83] 67  Resp:  [16-18] 16  BP: (103-156)/(63-85) 132/80  SpO2:  [94 %-100 %] 100 %    Gen: NAD, Aox3  HENT: atraumatic, normocephalic, wound from liver bx line on left neck  Heart: RRR  Lungs: CTAB, no crackles or wheezes  Abdomen: soft, NTND, ascites  Extremities: No edema, warmth and more erythematous spot on dialysis fistula on RUE with tenderness to touch      Artist ONEIDA Ill, MD

## 2024-01-20 NOTE — Unmapped (Signed)
 Alert and oriented,vitals stable.Independent with adls,up ad lib to the bathroom,no reported falls.Reported several BMs after taking Lactulose .Received prn Iv Dilaudid  for pain with good effect.Resting quietly in bed.    Shift Summary  HYDROmorphone  was administered twice during the shift for pain management, with varying effectiveness.    Patient refused both acetaminophen  and senna.    Peripheral vascular assessment noted non-pitting edema in the right upper extremity.    Fall risk interventions were consistently applied, including hourly visual checks.    Overall, the patient experienced fluctuating pain levels and maintained stable cognitive function.     Absence of Hospital-Acquired Illness or Injury: Peripheral vascular assessment noted non-pitting edema in the right upper extremity, and skin integrity showed bruising and redness.     Optimal Comfort and Wellbeing: Pain was reported in the arm and chest, with a temporary reduction after HYDROmorphone  administration.     Absence of Fall and Fall-Related Injury: Fall risk interventions were consistently applied, including hourly visual checks and ensuring the call light was within reach.     Optimal Pain Control and Function: Pain levels fluctuated, with a decrease to 0 after HYDROmorphone  administration at 10:24 PM, but increased again by 2:27 AM.     Optimal Cognitive Function: Neurological and psychosocial assessments were within defined limits throughout the shift.

## 2024-01-20 NOTE — Unmapped (Signed)
 Tacrolimus  Therapeutic Monitoring Pharmacy Note    Rishav Rockefeller is a 58 y.o. male continuing tacrolimus .     Indication: Liver transplant     Date of Transplant: 02/2012      Prior Dosing Information: Home regimen 1 mg BID     Source(s) of information used to determine prior to admission dosing: Clinic Note    Goals:  Therapeutic Drug Levels  Tacrolimus  trough goal: 2-4 ng/ml    Additional Clinical Monitoring/Outcomes  Monitor renal function (SCr and urine output) and liver function (LFTs)  Monitor for signs/symptoms of adverse events (e.g., hyperglycemia, hyperkalemia, hypomagnesemia, hypertension, headache, tremor)    Previous Lab Values  Tacrolimus , Trough   Date/Time Value Ref Range Status   01/20/2024 05:22 AM 6.1 5.0 - 15.0 ng/mL Final   01/18/2024 03:51 AM 7.0 5.0 - 15.0 ng/mL Final   01/16/2024 06:02 AM 5.2 5.0 - 15.0 ng/mL Final   12/04/2022 06:32 AM 5.2 5.0 - 15.0 ng/mL Final   12/01/2022 07:02 AM 3.0 (L) 5.0 - 15.0 ng/mL Final   10/06/2014 09:00 AM 6.8 <=20.0 ng/mL Final   08/31/2014 08:34 AM 4.2  Final   08/03/2014 08:22 AM 4.7  Final   07/07/2014 08:37 AM 4.2  Final   06/09/2014 08:27 AM 3.5  Final     Tacrolimus , Timed   Date/Time Value Ref Range Status   01/19/2024 08:24 AM 5.3 ng/mL Final   01/11/2024 09:43 AM 8.9 ng/mL Final   12/24/2023 10:23 AM 18.1 ng/mL Final   12/04/2023 12:54 PM 3.6 ng/mL Final       Result:  Tacrolimus  level from today was drawn ~ 8.5h after last dose     Pharmacokinetic Considerations and Significant Drug Interactions:  Concurrent CYP3A4 substrates/inhibitors: amlodipine  10mg  (home med)    Assessment/Plan:  7/25 - tacro level drawn too early -- true trough should be closer to goal, unable to fully assess for true trough.  7/26 - tacro level drawn too early.  7/27- Tacro drawn 8.5 hours following previous dose the night prior. Despite level again being high (6.1), no changes will be made based on todays level due to minimal certainty of a true trough being obtained once more. Please reassess with 07/28 AM levels    Recommendedation(s)  Continue current regimen of  Tacrolimus  1 mg BID   Recheck tacrolimus  level closer to a trough time    Follow-up  Continue MWF levels ordered.   A pharmacist will continue to monitor and recommend levels as appropriate    Please page service pharmacist with questions/clarifications.    Thedora LILLETTE Lighter, PharmD

## 2024-01-20 NOTE — Unmapped (Signed)
 Patient admitted for Hepatic encephalopathy. Patient remained alert oriented X 4. On room air. Vitals stable. Patient complained of right arm pain PRN oxycodone  and IV dilaudid  given as per orders. Blood glucose checked and insulin  coverage given as per orders. No further complain voiced. Call bell and side table within reach.      Shift Summary  Insulin  lispro was administered twice during the shift to manage blood glucose levels.    Pain management included HYDROmorphone  and oxyCODONE , with repositioning as a non-pharmacological intervention.    Cephalexin  and pantoprazole  were administered as part of the medication regimen.    Blood glucose levels remained high throughout the shift, requiring insulin  administration.    Overall, the patient's status requires ongoing attention to pain management and blood glucose control.     Absence of Hospital-Acquired Illness or Injury: The overbed table was consistently within reach, and non-skid footwear was worn throughout the shift, contributing to a safe environment. No alarms were present, indicating no immediate safety concerns.     Optimal Comfort and Wellbeing: Pain in the right side was constant and acute, with fluctuations in intensity throughout the shift. Pain interventions included repositioning and administration of HYDROmorphone  and oxyCODONE , which temporarily reduced pain levels.     Readiness for Transition of Care: The unplanned readmission score slightly increased over the shift, suggesting a need for continued monitoring.

## 2024-01-21 LAB — COMPREHENSIVE METABOLIC PANEL
ALBUMIN: 3.1 g/dL — ABNORMAL LOW (ref 3.4–5.0)
ALKALINE PHOSPHATASE: 369 U/L — ABNORMAL HIGH (ref 46–116)
ALT (SGPT): 8 U/L — ABNORMAL LOW (ref 10–49)
ANION GAP: 12 mmol/L (ref 5–14)
AST (SGOT): 10 U/L (ref <=34)
BILIRUBIN TOTAL: 0.5 mg/dL (ref 0.3–1.2)
BLOOD UREA NITROGEN: 34 mg/dL — ABNORMAL HIGH (ref 9–23)
BUN / CREAT RATIO: 3
CALCIUM: 9.3 mg/dL (ref 8.7–10.4)
CHLORIDE: 102 mmol/L (ref 98–107)
CO2: 25 mmol/L (ref 20.0–31.0)
CREATININE: 12.97 mg/dL — ABNORMAL HIGH (ref 0.73–1.18)
EGFR CKD-EPI (2021) MALE: 4 mL/min/1.73m2 — ABNORMAL LOW (ref >=60)
GLUCOSE RANDOM: 284 mg/dL — ABNORMAL HIGH (ref 70–179)
POTASSIUM: 3.9 mmol/L (ref 3.4–4.8)
PROTEIN TOTAL: 6.8 g/dL (ref 5.7–8.2)
SODIUM: 139 mmol/L (ref 135–145)

## 2024-01-21 LAB — MAGNESIUM: MAGNESIUM: 2.4 mg/dL (ref 1.6–2.6)

## 2024-01-21 LAB — TACROLIMUS LEVEL, TROUGH: TACROLIMUS, TROUGH: 6.9 ng/mL (ref 5.0–15.0)

## 2024-01-21 LAB — CBC
HEMATOCRIT: 34.9 % — ABNORMAL LOW (ref 39.0–48.0)
HEMOGLOBIN: 11.5 g/dL — ABNORMAL LOW (ref 12.9–16.5)
MEAN CORPUSCULAR HEMOGLOBIN CONC: 32.9 g/dL (ref 32.0–36.0)
MEAN CORPUSCULAR HEMOGLOBIN: 28.9 pg (ref 25.9–32.4)
MEAN CORPUSCULAR VOLUME: 87.9 fL (ref 77.6–95.7)
MEAN PLATELET VOLUME: 9.2 fL (ref 6.8–10.7)
PLATELET COUNT: 159 10*9/L (ref 150–450)
RED BLOOD CELL COUNT: 3.97 10*12/L — ABNORMAL LOW (ref 4.26–5.60)
RED CELL DISTRIBUTION WIDTH: 13.8 % (ref 12.2–15.2)
WBC ADJUSTED: 4.9 10*9/L (ref 3.6–11.2)

## 2024-01-21 LAB — PHOSPHORUS: PHOSPHORUS: 6.3 mg/dL — ABNORMAL HIGH (ref 2.4–5.1)

## 2024-01-21 MED ORDER — CEPHALEXIN 500 MG CAPSULE
ORAL_CAPSULE | Freq: Two times a day (BID) | ORAL | 0 refills | 4.00000 days | Status: CP
Start: 2024-01-21 — End: 2024-01-25
  Filled 2024-01-21: qty 7, 4d supply, fill #0

## 2024-01-21 MED ORDER — LACTULOSE 10 GRAM/15 ML ORAL SOLUTION
Freq: Three times a day (TID) | ORAL | 0 refills | 3.00000 days | Status: CP
Start: 2024-01-21 — End: 2024-02-20

## 2024-01-21 MED ORDER — OXYCODONE 5 MG/5 ML ORAL SOLUTION
ORAL | 0 refills | 2.00000 days | Status: CP | PRN
Start: 2024-01-21 — End: 2024-01-26

## 2024-01-21 MED ORDER — GENTAMICIN 1 MG/ML, SODIUM CITRATE 4% (2.4 ML) INTRA-CATHETER SYRINGE
0.00000 days | Status: CN
Start: 2024-01-21 — End: ?

## 2024-01-21 MED ORDER — SENNOSIDES 8.6 MG TABLET
ORAL_TABLET | Freq: Every evening | ORAL | 2 refills | 30.00000 days | Status: CN
Start: 2024-01-21 — End: 2024-04-20

## 2024-01-21 MED ORDER — URSODIOL 300 MG CAPSULE
ORAL_CAPSULE | Freq: Two times a day (BID) | ORAL | 2 refills | 30.00000 days | Status: CP
Start: 2024-01-21 — End: 2024-04-20
  Filled 2024-01-21: qty 120, 30d supply, fill #0

## 2024-01-21 MED ORDER — LACTULOSE 20 GRAM ORAL PACKET
PACK | Freq: Three times a day (TID) | ORAL | 2 refills | 30.00000 days | Status: CP
Start: 2024-01-21 — End: 2024-04-20

## 2024-01-21 MED ORDER — GLUCOSE 4 GRAM CHEWABLE TABLET
ORAL_TABLET | ORAL | 12 refills | 1.00000 days | Status: CN | PRN
Start: 2024-01-21 — End: 2025-01-20

## 2024-01-21 MED ORDER — GLUCAGON 1 MG SOLUTION FOR INJECTION (COMBINED VIALS) WRAPPER
Freq: Once | INTRAMUSCULAR | 0.00000 days | Status: CN | PRN
Start: 2024-01-21 — End: ?

## 2024-01-21 MED ORDER — LIDOCAINE 4 % TOPICAL PATCH
Freq: Every day | TRANSDERMAL | 0.00000 days | Status: CN | PRN
Start: 2024-01-21 — End: ?

## 2024-01-21 MED ADMIN — tacrolimus (PROGRAF) capsule 1 mg: 1 mg | ORAL | @ 02:00:00

## 2024-01-21 MED ADMIN — cephalexin (KEFLEX) capsule 500 mg: 500 mg | ORAL | @ 13:00:00 | Stop: 2024-01-21

## 2024-01-21 MED ADMIN — oxyCODONE (ROXICODONE) 5 mg/5 mL solution 10 mg: 10 mg | ORAL | @ 13:00:00 | Stop: 2024-01-21

## 2024-01-21 MED ADMIN — ursodiol (ACTIGALL) capsule 600 mg: 600 mg | ORAL | @ 13:00:00 | Stop: 2024-01-21

## 2024-01-21 MED ADMIN — gabapentin (NEURONTIN) capsule 300 mg: 300 mg | ORAL | @ 02:00:00

## 2024-01-21 MED ADMIN — lactulose (CEPHULAC) packet 20 g: 20 g | ORAL | @ 13:00:00 | Stop: 2024-01-21

## 2024-01-21 MED ADMIN — rifAXIMin (XIFAXAN) tablet 550 mg: 550 mg | ORAL | @ 02:00:00 | Stop: 2024-01-30

## 2024-01-21 MED ADMIN — lactulose (CEPHULAC) packet 20 g: 20 g | ORAL | @ 18:00:00 | Stop: 2024-01-21

## 2024-01-21 MED ADMIN — cephalexin (KEFLEX) capsule 500 mg: 500 mg | ORAL | @ 02:00:00 | Stop: 2024-01-25

## 2024-01-21 MED ADMIN — famotidine (PEPCID) tablet 20 mg: 20 mg | ORAL | @ 02:00:00

## 2024-01-21 MED ADMIN — insulin lispro (HumaLOG) inj PERCENTAGE MEAL EATEN 10 Units: 10 [IU] | SUBCUTANEOUS | @ 02:00:00

## 2024-01-21 MED ADMIN — insulin lispro (HumaLOG) inj PERCENTAGE MEAL EATEN 10 Units: 10 [IU] | SUBCUTANEOUS | @ 13:00:00 | Stop: 2024-01-21

## 2024-01-21 MED ADMIN — acetaminophen (TYLENOL) tablet 650 mg: 650 mg | ORAL | @ 18:00:00 | Stop: 2024-01-21

## 2024-01-21 MED ADMIN — insulin lispro (HumaLOG) injection CORRECTIONAL 0-20 Units: 0-20 [IU] | SUBCUTANEOUS | @ 13:00:00 | Stop: 2024-01-21

## 2024-01-21 MED ADMIN — mycophenolate (CELLCEPT) capsule 250 mg: 250 mg | ORAL | @ 13:00:00 | Stop: 2024-01-21

## 2024-01-21 MED ADMIN — insulin lispro (HumaLOG) injection CORRECTIONAL 0-20 Units: 0-20 [IU] | SUBCUTANEOUS | @ 19:00:00 | Stop: 2024-01-21

## 2024-01-21 MED ADMIN — ursodiol (ACTIGALL) capsule 600 mg: 600 mg | ORAL | @ 02:00:00

## 2024-01-21 MED ADMIN — insulin glargine (LANTUS) injection BASAL 60 Units: 60 [IU] | SUBCUTANEOUS | @ 02:00:00

## 2024-01-21 MED ADMIN — acetaminophen (TYLENOL) tablet 650 mg: 650 mg | ORAL | @ 02:00:00

## 2024-01-21 MED ADMIN — tacrolimus (PROGRAF) capsule 1 mg: 1 mg | ORAL | @ 13:00:00 | Stop: 2024-01-21

## 2024-01-21 MED ADMIN — HYDROmorphone (DILAUDID) injection Syrg 0.5 mg: .5 mg | INTRAVENOUS | @ 04:00:00 | Stop: 2024-02-02

## 2024-01-21 MED ADMIN — pantoprazole (Protonix) EC tablet 40 mg: 40 mg | ORAL | @ 13:00:00 | Stop: 2024-01-21

## 2024-01-21 MED ADMIN — mycophenolate (CELLCEPT) capsule 250 mg: 250 mg | ORAL | @ 02:00:00

## 2024-01-21 MED ADMIN — carvedilol (COREG) tablet 6.25 mg: 6.25 mg | ORAL | @ 02:00:00

## 2024-01-21 MED ADMIN — aluminum-magnesium hydroxide-simethicone (MAALOX MAX) 80-80-8 mg/mL oral suspension: 30 mL | ORAL | @ 06:00:00 | Stop: 2024-01-21

## 2024-01-21 MED ADMIN — rifAXIMin (XIFAXAN) tablet 550 mg: 550 mg | ORAL | @ 13:00:00 | Stop: 2024-01-21

## 2024-01-21 MED ADMIN — oxyCODONE (ROXICODONE) 5 mg/5 mL solution 10 mg: 10 mg | ORAL | @ 02:00:00 | Stop: 2024-02-03

## 2024-01-21 MED ADMIN — HYDROmorphone (DILAUDID) injection Syrg 0.5 mg: .5 mg | INTRAVENOUS | @ 14:00:00 | Stop: 2024-01-21

## 2024-01-21 MED ADMIN — famotidine (PEPCID) tablet 20 mg: 20 mg | ORAL | @ 13:00:00 | Stop: 2024-01-21

## 2024-01-21 NOTE — Unmapped (Shared)
 Internal Medicine (MEDW) Progress Note    Assessment & Plan:   Richard [name] is a 58 y.o. 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     Principal Problem:    Hepatic encephalopathy     Active Problems:    Hypertension    Type 2 diabetes mellitus with hyperglycemia, with long-term current use of insulin        H/O gastroesophageal reflux (GERD)    Liver replaced by transplant       Obstructive sleep apnea syndrome    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)    (HFpEF) heart failure with preserved ejection fraction       CAD (coronary artery disease)    Cirrhosis of transplanted liver         Active Problems    AV Fistual Clot, RUE  C/f Thrombophlebitis  Patient endorsed extreme painfulness of RUE at the location of his fistula. Fistula previously evaluated for stenosis on 5/14 (ruled it OK). Reports of recirculation but no evidence of decreased flows or prolonged bleeding and fistula with thrill, pulsatility, and collapsibility at the time. No thrill or bruit detected on exams since admission. PVL preformed finding no evidence of obstruction in the central veins. Evidence of acute obstruction visualized in the brachiocephalic AVF. All other venous findings in the upper extremity are within normal limits. VIR attempted to remove clot though unsuccessful, HD line placed 7/25.  Dialyzed 7/26 through temporary line. Patient has lessened pain, erythema, warmth in right [facility] on exam on 7/28 as compared to previous. Consistent with thrombophlebitis versus cellulitis. Improvement in symptoms comes after starting empirical treatment with Keflex  for 5 days.  Patient able be discharged with temporary dialysis line in place with plan for outpatient correction of AV fistula.  Continue to work on oral pain regimen that works for the patient.  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 Concern for cirrhosis post transplant, however liver biopsy performed 01/11/2024 indicated mild macrovesicular steatosis (5%) and no significant fibrosis.  Now with second admission for altered mental status likely 2/2 to HE as above. S/p recent biopsy iso transaminitis that was negative for cell mediated rejection. Pathophysiology of HE is likely due to constipation following recent med change to rifaximin  from lactulose  (7/18) and opioid use (for pain PRN following liver biopsy on 6/30). AST 128, ALT 105, Alkphos 391 on admission. Ursodiol  started 7/24 per GI recommendation given positive AMA and elevate ALP, although no findings on biopsy to suggest PBC.  Will pursue MRI abdomen with elastography given biopsy did not suggest advanced cirrhosis although clinical suspicion still elevated.  -Ursodiol  started 7/24  -MRI abdomen elastography ordered, can be done outpatient  -Variceal ppx - carvedilol  6.25mg  BID restarted  -Continue cellcept  250mg  BID  -Tacrolimus  1mg  BID  -7/26 trough above  therapeutic range, although this was drawn 4 hours early  - Repeat trough pending    MELD 3.0: 18 at 01/17/2024  9:36 AM  MELD-Na: 20 at 01/17/2024  9:36 AM  Calculated from:  Serum Creatinine: On dialysis. Using the maximum value.  Serum Sodium: 139 mmol/L (Using max of 137 mmol/L) at 01/17/2024  9:36 AM  Total Bilirubin: 0.9 mg/dL (Using min of 1 mg/dL) at 2/75/7974  0:63 AM  Serum Albumin: 3.1 g/dL at 2/75/7974  0:63 AM  INR(ratio): 0.98 (Using min of 1) at 01/15/2024  3:02 PM  Age at listing (hypothetical): 58 years  Sex: Male at 01/17/2024  9:36 AM    AMS-resolved  Hepatic encephalopathy-resolved  Pt presented with one day of gait disturbance and AMS. Pt with recent hospitalization (6/24-6/29) for same presentation where CT head and EEG were negative. AMS thought to be 2/2 to HE. Previously, pt not on lactulose  or Xifaxin. Started on lactulose  and rifaxamin since then, however per sister had decreased Bms in the last few days. With significant asterixis on exam on presentation, since resolving. Infectious w/up largely negative for other causes of AMS. Lactulose  uptitrated and patient improving with increased bowel movements. No fluid pocket available for paracentesis. Refine outpatient regiment for HE prophylaxis.   -Continue lactulose  20g TID, titrated at 3-5 BM daily  -Continue Xifaxin 550mg  BID  -Delirium precautions   -Bcx NGTD, UA negative  -Ursodiol  600 mg twice daily    Atrial flutter - Hx of persistent A FIb  Pt with hx of A fib, however EKG on admission c/w A flutter. Saw PCP outpatient, decided to restart A/C. Low heart rate, holding AV nodal blockers.  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 Pt on TTS dialysis. Previous kidney transplant evaluation closed 09/2023 in the setting of elevated A1C, can be re-referred in six months. Plan for dialysis per patient's schedule. Of note- had VIR study of dialysis circuit that was planned for 7/22, cancelled given admitted to hospital.  Date of last dialysis before admission 7/19.   - HD performed 7/26 with 4 L UF removed using temporary HD catheter  -BMP daily      T2DM  A1c on 7/21 10. Long-standing Type 2 Diabetes Mellitus, diagnosed 2015, post-liver transplant. Recently established with endocrine. Ultimate goal to optimize candidacy for potential renal/liver transplant in the future. Home regimen includes Tresiba  60U nightly, Novolog  25U before meals, Mounjaro  12.5 qWeek. Has CGM, however has trouble interpreting data. Glucose was elevated at 290 after starting 10 units NPH morning/night and 5 units lispro TID with meals. Fasting glucose remained elevated at 284 after increasing to 60 units glargine/evening, 10 units lispro with meals.  Changed insulin  regimen to the following on 7/28:  - 60 units glargine each evening, 15 units lispro with meals  - Hypoglycemia protocol  - Hold home Mounjaro   - Gabapentin  300 mg nightly    Gastric reflux  Patient with persistent feelings of gastric reflux, particularly while laying flat.  Pain not worse while ambulating.  -Famotidine  20 mg twice daily  -Pantoprazole  40 mg daily     HFpEF, NYHA class I  Pt with hx of  HFpEF, currently not on any diuretics per chart review. ProBNP on admission elevated to 14,682. Last value documented is a BNP from 5/23, 603. No orthopnea, dyspnea on exertion. Not overtly volume overloaded at this time. Will plan for dialysis Saturday 7/26 with line access if AVF not available. No indications for emergent dialysis as of now. .  Last echo May/2024.  Myocardial perfusion scan December/2024 with LVEF >60%   -Daily BMP  -Dialysis as above     HTN - Recent hypotension  Pt with hx of uncontrolled HTN on coreg , clonidine, hydralazine . However, presented to OSH with significant hypotension. All anti-hypertensives stopped at that time. On presentation, pt with labile BP measuring 130s-170s. Bp have since recovered and have been consistently elevated. Patient has been restarted on home amlodipine  and coreg .   -Continue amlodipine  10mg  daily, Coreg  6.25mg  BID daily    Chronic Problems  Hx DVT - Hold Eliquis  5mg  BID for outpt colonoscopy 7/31  HLD- Pt prescribed atorvastatin  40mg  daily, however reportedly not taking. Held starting on 7/23 due to decompensated cirrhosis.   Hx of OSA- No longer uses CPAP after losing 40 lbs.  CAD- NM spect in 2024 with moderate sized, mild in severity, fixed perfusion defect mid anterior, apical anterior and apical segments. No CP on admission.    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    Medical Decision Making: Reviewed records from the following unique sources  EPIC, sister.    Daily Checklist:  Diet: Regular Diet  DVT PPx: Patient Already on Full Anticoagulation with eliquis  5mg  BID  Electrolytes: No Repletion Needed  Code Status: Full Code  Dispo:  Patient appropriate for Observation based on expectation at time of admission that period of observation will last less than two midnights    Team Contact Information:   Primary Team: Internal Medicine (MEDW)  Primary Resident: Artist Ill, MD  M3: Richard [name]  Resident's Pager: [phone] (Gen MedW Intern - [place])    Interval History:   Patient with improved RUE pain. Still receiving PRN dilaudid . Tearful when discussing uncertainty around when future AVF procedure will be scheduled and  c/f returning to the hospital again. AMS largely resolved. Recounts not remembering why he was brought to the hospital or any of the events in the ED.    ROS: Denies headache, chest pain, shortness of breath, abdominal pain, nausea, vomiting.    Objective:   Temp:  [36.4 ??C (97.5 ??F)-36.7 ??C (98.1 ??F)] 36.7 ??C (98.1 ??F)  Pulse:  [60-81] 81  Resp:  [18] 18  BP: (111-148)/(75-102) 111/75  SpO2:  [98 %-99 %] 99 %    Gen: NAD, Aox3  HENT: atraumatic, normocephalic, wound from liver bx line on left neck  Heart: RRR  Lungs: CTAB, no crackles or wheezes  Abdomen: soft, NTND, ascites  Extremities: No edema, less warmth and erythematous on dialysis fistula on RUE with less tenderness to touch      Richard [name]

## 2024-01-21 NOTE — Unmapped (Signed)
 Physician Discharge Summary [facility]  7 GENERAL MEDICINE UNIT [facility]  [address]  [place] [address]  Dept: [phone]  Loc: [phone]     Identifying Information:   Richard [name]  01/26/1966  [id number]    Primary Care Physician: Jama Mayo, MD     Code Status: Full Code    Admit Date: 01/15/2024    Discharge Date: 01/21/2024     Discharge To: Home    Discharge Service: [facility] - General Medicine Floor Team (MED LELON GLENWOOD [facility])     Discharge Attending Physician: Powell Sydelle Caddy, MD    Discharge Diagnoses:   Principal Problem:    Hepatic encephalopathy    (POA: Yes)  Active Problems:    Hypertension (POA: Yes)    Type 2 diabetes mellitus with hyperglycemia, with long-term current use of insulin     (POA: Not Applicable)    H/O gastroesophageal reflux (GERD) (POA: Not Applicable)    Liver replaced by transplant    (POA: Not Applicable)    Obstructive sleep apnea syndrome (POA: Yes)    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) (POA: Not Applicable)    (HFpEF) heart failure with preserved ejection fraction    (POA: Yes)    CAD (coronary artery disease) (POA: Yes)    Cirrhosis of transplanted liver    (POA: Yes)    Thrombophlebitis of arm, right (POA: Yes)  Resolved Problems:    * No resolved hospital problems. 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     Active Problems  AV Fistual Clot, RUE  C/f Thrombophlebitis  Patient endorsed extreme painfulness of RUE at the location of his fistula. Fistula previously evaluated for stenosis on 5/14 (continued to dialyze through it). No thrill or bruit detected on exams this admission. PVL preformed found no evidence of obstruction in the central veins. Evidence of acute obstruction visualized in the brachiocephalic AVF.  VIR attempted to remove clot though unsuccessful, HD line placed 7/25.  Dialyzed 7/26 through temporary line, okay to continue dialysis through temporary line until fistula repair.    Patient noted to have pain and redness in the right antecubital fossa near his fistula consistent with thrombophlebitis versus cellulitis. Improvement in symptoms after starting empiric treatment with Keflex .  Recommend continuing this regimen for a total of 5 days.      Discharged with temporary dialysis line in place with plan for outpatient correction of AV fistula. Eliquis  held upon discharge in light of upcoming interventions - AVF correction with Dr. Marchelle, colonoscopy 7/31. Patient discharged on outpatient pain regiment of 5 mg oxycodone  oral solution and 650mg  tylenol  q8.    Decompensated Cirrhosis vs steatosis of transplanted liver   Transaminitis  Patient with [name] cirrhosis s/p transplant (02/2012). Concern for cirrhosis post transplant, however liver biopsy performed 01/11/2024 indicated mild macrovesicular steatosis (5%) and no significant fibrosis.  Now with second admission for altered mental status likely 2/2 to HE as above. S/p recent biopsy iso transaminitis that was negative for cell mediated rejection. Pathophysiology of HE is likely due to constipation following recent med change to rifaximin  from lactulose  (7/18) and opioid use (for pain PRN following liver biopsy on 6/30).  AST 128, ALT 105, Alkphos 391 on admission. Ursodiol  started 7/24 per GI recommendation given positive AMA and elevate ALP, although no findings on biopsy to suggest PBC.  Will pursue MRI abdomen with elastography outpatient given biopsy did not suggest advanced cirrhosis although clinical suspicion still elevated. Patient restarted on carvedilol  6.25 mg BID for variceal prophylaxis. Cellcept  250mg  BID and tacrolimus  1mg  BID continued. MELD-Na score of 7/24 of 20. AMS-resolved  Hepatic encephalopathy-resolved  Patient presented with 1 day of gait disturbance and AMS.  Recent hospitalization (6/24 - 6/29) for same presentation where CT head and EEG were negative for acute process.  AMS thought to be secondary to HD.  Started on lactulose  and rifaximin  after that hospitalization, however sister noted decreased BMs in the days leading to this hospitalization.  Presented with significant asterixis on exam, since resolved.  Workup for other causes of AMS including infection were negative.  Lactulose  titrated to 3-5 bowel movements daily with marked improvement in mental status exam.  No fluid pocket available for paracentesis.  Recommended to continue with lactulose  20 g 3 times daily as well as rifaximin  550 mg twice daily.     Atrial flutter - Hx of persistent A FIb  EKG this admission consistent with a flutter with variable block.  Takes Eliquis  5 mg twice daily for history of A-fib. Outpt colonoscopy scheduled for 7/31, eliquis  to be held for 4 days prior and was therefore held on discharge. Coreg  6.25mg  BID restarted 7/25 for variceal ppx and in setting of elevated Bps. Potassium and magnesium  kept within normal limits.  Recommend continuing to hold Eliquis  through 7/31 procedure     ESRD on iHD  ESRD due to CNI toxicity and hypertension (biopsy 05/2019). Pt on TTS dialysis. Previous kidney transplant evaluation closed 09/2023 in the setting of elevated A1C, can be re-referred in six months. Plan for dialysis per patient's schedule. Of note- had VIR study of dialysis circuit that was planned for 7/22, cancelled given admitted to hospital.  Date of last dialysis before admission 7/19. HD performed 7/26 with 4 L UF removed using temporary HD catheter.  Plan to follow-up with Dr. Marchelle at [facility] for AV fistula repair.     T2DM  A1c on 7/21 10. Long-standing Type 2 Diabetes Mellitus, diagnosed 2015, post-liver transplant. Recently established with endocrine. Ultimate goal to optimize candidacy for potential renal/liver transplant in the future. Home regimen includes Tresiba  60U nightly, Novolog  25U before meals, Mounjaro  12.5 qWeek. Has CGM, however has trouble interpreting data. Glucose was elevated at 290 after starting 10 units NPH morning/night and 5 units lispro TID with meals. Fasting glucose remained elevated at 284 after increasing to 60 units glargine/evening, 10 units lispro with meals.  Changed insulin  regimen to the following on 7/28: 60 units glargine each evening, 15 units lispro with meals. Mounjaro  held while inpatient, restarted to 7.5 on discharge. Received Gabapentin  300 mg nightly.  Recommend restarting home regimen upon discharge.     Gastric reflux  Patient with persistent feelings of gastric reflux, particularly while laying flat.  Pain not worse while ambulating. Regimen of Famotidine  20 mg twice daily and Pantoprazole  40 mg daily established.     HFpEF, NYHA class I  Pt with hx of HFpEF, currently not on any diuretics per chart review. ProBNP on admission elevated to 14,682. Last value documented is a BNP from 5/23, 603. No orthopnea, dyspnea on exertion. Not overtly volume overloaded at this time. Will plan for dialysis Saturday 7/26 with line access  if AVF not available. No indications for emergent dialysis as of now. .  Last echo May/2024.  Myocardial perfusion scan December/2024 with LVEF >60%.     HTN - Recent hypotension  Pt with hx of uncontrolled HTN on coreg , clonidine, hydralazine . However, presented to OSH with significant hypotension. All anti-hypertensives stopped at that time. On presentation, pt with labile BP measuring 130s-170s. Bp have since recovered and have been consistently elevated. Patient has been restarted on home amlodipine  and coreg .   Patient to continue amlodipine  10mg  daily, Coreg  6.25mg  BID daily.             Outpatient Provider Follow Up Issues:   Colonoscopy 7/31  MRI elastography for further diagnostic workup of patient's liver pathology (cirrhosis vs steatosis)  AVF thrombophlebitis follow up with outpatient provider Dr. Marchelle  Continue 5 day course of keflex  from thrombophlebitis 7/27-7/31.    Touchbase with Outpatient Provider:  Warm Handoff: Completed on 01/21/24 by Artist ONEIDA Ill, MD  (Intern) via [facility] Message    Procedures:  Dialysis 7/27  Dialysis fistulogram with attempted declot 7/25  ______________________________________________________________________  Discharge Medications:      Your Medication List        PAUSE taking these medications      ELIQUIS  5 mg Tab  Wait to take this until your doctor or other care provider tells you to start again.  Hold eliquis  until patient has completed colonoscopy and had outpt procedural follow up for AVF thrombophlebitis.   Generic drug: apixaban   Take 1 tablet (5 mg total) by mouth two (2) times a day.     MOUNJARO  12.5 mg/0.5 mL Pnij  Wait to take this until your doctor or other care provider tells you to start again.  Generic drug: tirzepatide   Inject 12.5 mg under the skin every seven (7) days.  You also have another medication with the same name that you may need to continue taking.     MOUNJARO  15 mg/0.5 mL Pnij  Wait to take this until your doctor or other care provider tells you to start again.  Generic drug: tirzepatide   Inject 15 mg under the skin every seven (7) days.  You also have another medication with the same name that you may need to continue taking.            START taking these medications      acetaminophen  325 MG tablet  Commonly known as: TYLENOL   Take 2 tablets (650 mg total) by mouth every eight (8) hours.     cephalexin  500 MG capsule  Commonly known as: KEFLEX   Take 1 capsule (500 mg total) by mouth every twelve (12) hours for 4 days.     famotidine  10 MG tablet  Commonly known as: PEPCID   Take 1 tablet (10 mg total) by mouth daily.  Start taking on: January 22, 2024     oxyCODONE  5 mg/5 mL solution  Commonly known as: ROXICODONE   Take 10 mL (10 mg total) by mouth every four (4) hours as needed for pain.     pantoprazole  40 MG tablet  Commonly known as: Protonix   Take 1 tablet (40 mg total) by mouth daily before breakfast.  Start taking on: January 22, 2024     ursodiol  300 mg capsule  Commonly known as: ACTIGALL   Take 2 capsules (600 mg total) by mouth two (2) times a day.            CHANGE how you take these medications  lactulose  10 gram/15 mL solution  Take 30 mL (20 g total) by mouth Three (3) times a day.  What changed:   how much to take  when to take this            CONTINUE taking these medications      amlodipine  10 MG tablet  Commonly known as: NORVASC   Take 1 tablet (10 mg total) by mouth daily.     atorvastatin  40 MG tablet  Commonly known as: LIPITOR   Take 1 tablet (40 mg total) by mouth daily.     carvedilol  6.25 MG tablet  Commonly known as: COREG   Take 1 tablet (6.25 mg total) by mouth two (2) times a day. Patient unsure about dosage     DEXCOM G6 RECEIVER Misc  Generic drug: blood-glucose,receiver,cont  1 each by Miscellaneous route in the morning. Dispense 1 receiver annually.  Sent to [facility].     DEXCOM G6 TRANSMITTER Devi  Generic drug: blood-glucose transmitter  1 each by Miscellaneous route Every three (3) months. [facility] pharmacy     DEXCOM G6 TRANSMITTER Devi  Generic drug: blood-glucose transmitter  Use as directed to monitor blood glucose     DEXCOM G7 SENSOR Devi  Generic drug: blood-glucose sensor  Use 1 sensor every 10 days.     gabapentin  300 MG capsule  Commonly known as: NEURONTIN   Take 1 capsule (300 mg total) by mouth nightly.     insulin  degludec 200 unit/mL (3 mL) Inpn  Commonly known as: TRESIBA  FLEXTOUCH U-200  Inject 0.3 mL (60 Units total) under the skin daily. Max dose of 100 units per day.     lanthanum  1000 MG chewable tablet  Commonly known as: FOSRENOL   Chew 1 tablet (1,000 mg total) Three (3) times a day with a meal.     MOUNJARO  7.5 mg/0.5 mL Pnij  Generic drug: tirzepatide   Inject 7.5 mg under the skin every seven (7) days. multivitamin per tablet  Commonly known as: TAB-A-VITE/THERAGRAN  Take 1 tablet by mouth in the morning.     mycophenolate  250 mg capsule  Commonly known as: CELLCEPT   Take 1 capsule (250 mg total) by mouth two (2) times a day.     NovoLOG  Flexpen U-100 Insulin  100 unit/mL (3 mL) injection pen  Generic drug: insulin  aspart  Inject 0.35 mL (35 Units total) under the skin Three (3) times a day before meals.     polyethylene glycol 236-22.74-6.74 gram solution  Commonly known as: GOLYTELY   Take by mouth as directed per [facility] GI prep instructions, for split bowel prep.     PROGRAF  0.5 mg capsule  Generic drug: tacrolimus   Take 2 capsules (1 mg total) by mouth two (2) times a day.     TRUEPLUS PEN NEEDLE 32 gauge x 5/32 (4 mm) Ndle  Generic drug: pen needle, diabetic  Use with insulin  up to 4 times/day as needed.     TRUEPLUS PEN NEEDLE 32 gauge x 5/32 (4 mm) Ndle  Generic drug: pen needle, diabetic  Use with insulin  up to 4 times a day as needed.     VITAMIN D3 ORAL  Take 2,000 Units by mouth in the morning.     XIFAXAN  550 mg Tab  Generic drug: rifAXIMin   Take 1 tablet (550 mg total) by mouth two (2) times a day.              Allergies:  Patient has no known allergies.  ______________________________________________________________________  Pending Test Results:  Most Recent Labs:  All lab results last 24 hours -   Recent Results (from the past 24 hours)   POCT Glucose    Collection Time: 01/20/24  5:07 PM   Result Value Ref Range    Glucose, POC 204 (H) 70 - 179 mg/dL   POCT Glucose    Collection Time: 01/20/24 10:13 PM   Result Value Ref Range    Glucose, POC 140 70 - 179 mg/dL   POCT Glucose    Collection Time: 01/21/24  2:08 AM   Result Value Ref Range    Glucose, POC 178 70 - 179 mg/dL   Phosphorus Level    Collection Time: 01/21/24  4:49 AM   Result Value Ref Range    Phosphorus 6.3 (H) 2.4 - 5.1 mg/dL   Magnesium  Level    Collection Time: 01/21/24  4:49 AM   Result Value Ref Range    Magnesium  2.4 1.6 - 2.6 mg/dL   CBC    Collection Time: 01/21/24  4:49 AM   Result Value Ref Range    WBC 4.9 3.6 - 11.2 10*9/L    RBC 3.97 (L) 4.26 - 5.60 10*12/L    HGB 11.5 (L) 12.9 - 16.5 g/dL    HCT 65.0 (L) 60.9 - 48.0 %    MCV 87.9 77.6 - 95.7 fL    MCH 28.9 25.9 - 32.4 pg    MCHC 32.9 32.0 - 36.0 g/dL    RDW 86.1 87.7 - 84.7 %    MPV 9.2 6.8 - 10.7 fL    Platelet 159 150 - 450 10*9/L   Comprehensive Metabolic Panel    Collection Time: 01/21/24  4:49 AM   Result Value Ref Range    Sodium 139 135 - 145 mmol/L    Potassium 3.9 3.4 - 4.8 mmol/L    Chloride 102 98 - 107 mmol/L    CO2 25.0 20.0 - 31.0 mmol/L    Anion Gap 12 5 - 14 mmol/L    BUN 34 (H) 9 - 23 mg/dL    Creatinine 87.02 (H) 0.73 - 1.18 mg/dL    BUN/Creatinine Ratio 3     eGFR CKD-EPI (2021) Male 4 (L) >=60 mL/min/1.56m2    Glucose 284 (H) 70 - 179 mg/dL    Calcium  9.3 8.7 - 10.4 mg/dL    Albumin 3.1 (L) 3.4 - 5.0 g/dL    Total Protein 6.8 5.7 - 8.2 g/dL    Total Bilirubin 0.5 0.3 - 1.2 mg/dL    AST 10 <=65 U/L    ALT 8 (L) 10 - 49 U/L    Alkaline Phosphatase 369 (H) 46 - 116 U/L   Tacrolimus  Level, Trough    Collection Time: 01/21/24  4:49 AM   Result Value Ref Range    Tacrolimus , Trough 6.9 5.0 - 15.0 ng/mL   POCT Glucose    Collection Time: 01/21/24  8:38 AM   Result Value Ref Range    Glucose, POC 236 (H) 70 - 179 mg/dL   POCT Glucose    Collection Time: 01/21/24  2:09 PM   Result Value Ref Range    Glucose, POC 258 (H) 70 - 179 mg/dL       Relevant Studies/Radiology:  IR Insert Tunneled Catheter (Age Greater Than 5 Years)  Result Date: 01/21/2024  PROCEDURE: Tunneled central venous catheter placement EXAM: IR INSERT TUNNELED CATHETER AGE GREATER THAN 5 YRS ACCESSION: [id number] UN REPORT DATE: 01/18/2024 7:01 PM Procedural Personnel Attending physician(s): Dr. Jed Cap Resident  physician(s): Dr.  Morene Daring Advanced practice provider(s): None Pre-procedure diagnosis: End-stage renal disease with thrombosed right arm brachiocephalic fistula. Post-procedure diagnosis: Same Indication: Hemodialysis Additional clinical history: None _______________________________________________________________ PROCEDURE SUMMARY: - Venous access with ultrasound guidance - Tunneled central venous catheter insertion with fluoroscopic guidance - Additional procedure(s): None PROCEDURE DETAILS: Pre-procedure Consent: Informed consent for the procedure including risks, benefits and alternatives was obtained and time-out was performed prior to the procedure. Preparation: The site was prepared and draped using maximal sterile barrier technique including cutaneous antisepsis. Anesthesia/sedation Level of anesthesia/sedation: General anesthesia Anesthesia/sedation administered by: Anesthesiology Total intra-service sedation time (minutes): I personally spent 0 minutes, continuously monitoring the patient face-to-face during the administration of moderate sedation. Radiology nurse was present for the duration of the procedure to assist in patient monitoring. Pre and Post Sedation activities have been reviewed. Access Local anesthesia was administered. The vessel was evaluated with ultrasonography and determined to be patent. Real time ultrasound was used to visualize needle entry into the vessel and a permanent image was stored. Laterality: Left Vein accessed: Internal jugular vein Access technique: Micropuncture set with 21 gauge needle Venography Indication for venography: Not applicable Vein catheterized: Not applicable Findings: Not applicable Catheter placement An incision was made near the venous access site and the catheter was tunneled subcutaneously to the venous access site. The catheter was advanced via a peel-away sheath into the vein under fluoroscopic guidance. Catheter tip location was fluoroscopically verified and a permanent image was stored. Catheter placed: Palindrome hemodialysis catheter Lot number: [id number] Catheter size Braulio): 14.5 x 28 cm Lumens: Dual-lumen Catheter tip: right atrium Catheter flush: Heparin  (1000 units/mL) Closure A sterile dressing was applied. Access site closure technique: Absorbable suture and tissue adhesive Catheter securement technique: Non-absorbable suture Contrast Contrast agent: None Contrast volume (mL): 0 Radiation Dose Fluoroscopy time (minutes): 9.2 Reference Air Kerma (mGy): 163.9 Additional Details Additional description of procedure: None Registry event: V/3/g Device used: None Equipment details: None Unique Device Identifiers: Not available Specimens removed: None Estimated blood loss (mL): Less than 10 Standardized report: [email or web address] Attestation Signer name: Hyeon Yu I attest that I supervised the procedure and was immediately available. I reviewed the stored images and agree with the report as written. _______________________________________________________________ Complications: No immediate complications.     Insertion of left-sided tunneled Dual-lumen Hemodialysis central venous catheter in the internal jugular vein, with tip in the expected location of the right atrium. Plan: The catheter may be used immediately.     IR Dialysis Circuit Study  Result Date: 01/21/2024  PROCEDURE: Dialysis circuit declot EXAM: IR DIALYSIS CIRCUIT STUDY ACCESSION: [id number] UN REPORT DATE: 01/18/2024 6:50 PM Procedural Personnel Attending physician(s): Dr. Jed Cap Resident physician(s): Dr. Morene Daring Advanced practice provider(s): None Pre-procedure diagnosis: Thrombosed fistula Post-procedure diagnosis: Same Indication: Thrombosed fistula Additional clinical history: None _______________________________________________________________ PROCEDURE SUMMARY: - Access of dialysis circuit with ultrasound guidance - Procedure: Successful attempted thrombectomy/declot as described below. - Additional procedure(s): Placement of tunneled HD catheter given unsuccessful thrombectomy procedure. (Please see separately dictated same day procedural report for further details) PROCEDURE DETAILS: Pre-procedure Consent: Informed consent for the procedure including risks, benefits and alternatives was obtained and time-out was performed prior to the procedure. Preparation: The site was prepared and draped using maximal sterile barrier technique including cutaneous antisepsis. Anesthesia/sedation Level of anesthesia/sedation: General anesthesia Anesthesia/sedation administered by: Anesthesiology Total intra-service sedation time (minutes): I personally spent 0 minutes, continuously monitoring the patient face-to-face during the administration of moderate sedation. Radiology nurse was present for the duration of  the procedure to assist in patient monitoring. Pre and Post Sedation activities have been reviewed. Initial dialysis circuit evaluation Dialysis circuit side: Right Dialysis circuit type: Brachial-cephalic Initial circuit palpation: No pulse, no thrill Access (towards inflow) Local anesthesia was administered. The dialysis circuit was sonographically evaluated and judged to be thrombosed. Real time ultrasound was used to visualize needle entry into the dialysis circuit and a permanent image was stored.. This included thrombosis of the arteriovenous anastomosis. Of note sonographically, a large volume of the visualized clot appear to be chronic in nature. Access technique: Micropuncture set with 21 gauge needle Sheath size (French): 7 Access (towards outflow) Local anesthesia was administered. The dialysis circuit was sonographically evaluated and judged to be thrombosed. Real time ultrasound was used to vistualize needle entry into the dialysis circuit and a permanent image was stored.. Access technique: Micropuncture set with 21 gauge needle Sheath size (French): 7 Initial angiography Initial angiography of the dialysis circuit, outflow veins, and central veins was performed. Findings: Thrombosed arteriovenous anastomosis. Patent central cephalic arch, subclavian vein and brachiocephalic vein and SVC. Extensive thrombosis of the fistula/outflow involving the cephalic vein just peripheral to the arch and extending throughout the remainder of the cephalic outflow vein and fistula. Intra-procedural thrombolytic injection Thrombolytic was injected within the thrombosed segment of the dialysis circuit. Thrombolytic agent: alteplase  Thrombolytic dose: 4 mL Establishment of arterial inflow Method: Establishment of arterial inflow was attempted with balloon device, however this was unsuccessful. Final angiography Findings: Unsuccessful thrombectomy with persistent thrombosis of the fistula and persistent thrombosis of the arteriovenous anastomosis. Final circuit palpation: No pulse and no thrill Closure (inflow) The sheath was removed and hemostasis was achieved with manual compression. A sterile dressing was applied. Closure (outflow) The sheath was removed and hemostasis was achieved with manual compression. A sterile dressing was applied. Contrast Contrast agent: Omnipaque  300 Contrast volume (mL): 50 Radiation Dose Fluoroscopy time (minutes): 3.7 Reference Air Kerma (mGy): 4.45 Additional Details Additional description of procedure: None Registry event: V/3/g Device used: None Equipment details: None Unique Device Identifiers: Not available Specimens removed: None Estimated blood loss (mL): Less than 10 Standardized report: [email or web address] Attestation Signer name: Hyeon Yu I attest that I was present for the entire procedure. I reviewed the stored images and agree with the report as written. _______________________________________________________________ Complications: No immediate complications aside from unsuccessful thrombectomy/declotting procedure of the patient's AV fistula.     Unsuccessful thrombectomy/declotting procedure of the patient's thrombosed AV fistula. Of note, sonographic evaluation of the fistula demonstrated a large volume of the clot appearing to be chronic in nature. The entire outflow vein was thrombosed from the fistula to the cephalic arch. Complete occlusion of the arteriovenous anastomosis Plan: Proceed with placement of tunneled hemodialysis catheter for dialysis access.     PVL Hemodialysis Graft/Fistula Duplex  Result Date: 01/18/2024    Peripheral Vascular Lab     [address]   [place], [place] [address]  PVL HEMODIALYSIS GRAFT-FISTULA DUPLEX Patient Demographics Pt. Name: HABIB KISE Location: [facility] Inpatient Lab MRN:      [id number]         Sex:      M DOB:      [birth year]         Age:      58 years  Study Information Authorizing         [id number] KRISTI ANN    Performed Time       01/17/2024 Provider Name       NJARAVELIL  10:13:30 PM Ordering Physician  KRISTI ANN NJARAVELIL Patient Location     [facility] Accession Number    [id number] UN        Technologist         Dorn Curls Diagnosis:                                Dispensing optician Ordered Reason For Exam: Patency of Right AVF, concern for clot  Reason For Exam = Unable to dialyze through AVF/AVG. Access site = Right Upper Extremity. Access Type = brachial-cephalic AVF. Right brachiocephalic AVF placed on 01/15/23.  Final Interpretation Occludeed AVF in the right arm _ See below.   Electronically signed by [id number] Almeda Dolores MD on 01/18/2024 at 8:23:08 AM.  Examination Protocol: The dialysis access is interrogated using Doppler ultrasound. B-mode imaging and color Doppler are used to evaluate for diameters, depths, velocities, flow volumes, and abnormalities.When arterial steal is suspected by ordering physician, PW Doppler signals are evaluated at the wrist and PPG tracings are obtained from the digits. In the presence of retrograde flow at the wrist or nonpulsatile PPG tracings, dialysis access may be momentarily occluded to evaluate hemodynamic changes. In the presence of inadaquate fistula maturation, the afferant arteries and efferent veins are evaluated for obstruction. In the presence of suspected venous hypertension, the central veins are evaluated for stenosis.  Findings: What appeared to be an occluded brachiocephalic AVF was visualized in the right arm.     PVL Venous Duplex Upper Extremity Right  Result Date: 01/18/2024   Peripheral Vascular Lab     [address]   [place], [place] [address]  PVL VENOUS DUPLEX UPPER EXTREMITY RIGHT Patient Demographics Pt. Name: LAVARR PRESIDENT Location: PVL Inpatient Lab MRN:      [id number]         Sex:      M DOB:      [birth year]         Age:      89 years  Study Information Authorizing Provider [phone] Aryaan Persichetti T      Performed Time       01/17/2024 10:01:55 Name                 Shanautica Forker                                    PM Ordering Physician   ARTIST ONEIDA ILL        Patient Location     [facility] Accession Number     [id number] UN      Technologist         Dorn Curls Diagnosis:                               Assisting                                          Technologist Ordered Reason For Exam: no bruit in AV  fistula, pain Other Indication: as note above per ordering provider Risk Factors: Immobility and DVT (RT ACF-wrist cephalic vein on 2/73/7975.). Other Factors: (Right brachiocephalic AVF on 01/15/23). Anticoagulation: (Heparin ).  Final Interpretation Right No evidence of DVT detected in the central veins. Evidence of acute DVT in the central veins. Left Subclavian vein Doppler signal is within normal limits. This indirect finding suggests central vein patency.  Electronically signed by [id number] Almeda Dolores MD on 01/18/2024 at 8:21:30 AM.  Examination Protocol The internal jugular, brachiocephalic, subclavian, and axillary, brachial, basilic and cephalic veins are routinely assessed on the requested limb. Spectral and Color Doppler data is the primary method for evaluating the brachiocephalic and subclavian veins. Venous compression is used to evaluate the internal jugular, axillary, and upper arm veins. If a unilateral exam is requested, a contralateral subclavian vein Doppler signal is required for comparison.  Limitations:  Duplex Findings Right No abnormality of venous architecture is observed in the central veins. All Doppler signals are appropriately pulsatile with ventilatory excursions. Color Doppler notes appropriate filling in all vessels. Evidence of acute obstruction is visualized in the brachiocephalic AVF. All other venous findings in the upper extremity are within normal limits. Left Subclavian vein Doppler signal appears within normal limits.  Summary of Findings Right No evidence of obstruction was seen in the central veins. Evidence of acute obstruction is visualized in the brachiocephalic AVF. All other venous findings in the upper extremity are within normal limits. Left Normal subclavian vein Doppler signal suggests proximal central venous patency.      ECG 12 Lead  Result Date: 01/17/2024  ATRIAL FIBRILLATION WITH SLOW VENTRICULAR RESPONSE LEFT AXIS DEVIATION RIGHT BUNDLE BRANCH BLOCK INFERIOR INFARCT , AGE UNDETERMINED ABNORMAL ECG WHEN COMPARED WITH ECG OF 17-Jan-2024 09:17, INFERIOR INFARCT IS NOW PRESENT    ECG 12 Lead  Result Date: 01/17/2024  ATRIAL FIBRILLATION RIGHT BUNDLE BRANCH BLOCK LEFT ANTERIOR FASCICULAR BLOCK BIFASCICULAR BLOCK POSSIBLE LATERAL INFARCT  ABNORMAL ECG WHEN COMPARED WITH ECG OF 15-Jan-2024 14:47, SIGNIFICANT CHANGES HAVE OCCURRED    US  Liver Transplant  Result Date: 01/16/2024  EXAM: US  LIVER TRANSPLANT ACCESSION: [id number] UN REPORT DATE: 01/16/2024 9:36 AM CLINICAL INDICATION: 58 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 FINDINGS: Limited examination due to patient body habitus and inability to fully participate in exam. HEPATOBILIARY: There is coarse echotexture of the liver however evaluation is limited as described above. No focal hepatic lesions. No intrahepatic biliary ductal dilatation. The common bile duct is dilated but may be within normal limits for postcholecystectomy patient. The gallbladder is surgically absent.      Liver: 14.2 cm      Common bile duct: 1.2 cm PANCREAS: Limited evaluation due to bowel gas shadowing. SPLEEN: At the upper limits of normal size.      Spleen: 12.7 cm KIDNEYS: The kidneys are not well visualized due to bowel gas shadowing. The candidate measurements show that they are small in size. No solid masses or calculi. No hydronephrosis.      Right kidney: 8.3 cm      Left kidney: 7.9 cm VESSELS: - Portal vein: The main and right portal veins are patent with hepatopetal flow. Normal main portal vein velocity (0.20 m/s or greater). The main and right portal veins are small in caliber. The left portal vein could not be visualized which is  likely secondary to the limitations of the study.      Main portal vein diameter: 0.33 cm      Main portal vein pre anastomosis velocity: 0.14 m/s      Main portal vein anastomosis velocity: 0.16 m/s      Main portal vein post anastomosis velocity: 0.17 m/s      Anterior right portal vein velocity: 0.09 m/s      Posterior right portal vein velocity: 0.1 m/s      Left portal vein velocity: Not visualized      Right portal vein flow: hepatopetal      Left portal vein flow: Non Vis - Splenic vein: Patent, with hepatopetal flow.      Splenic vein midline: hepatopetal      Splenic vein proximal: Hepatopetal - Hepatic veins/IVC: The IVC, left, middle and right hepatic veins are were patent.      Left hepatic vein phasicity/flow: triphasic      Middle hepatic vein phasicity/flow: triphasic      Right hepatic vein phasicity/flow: triphasic      Inferior vena cava phasicity/flow: triphasic - Hepatic artery: Patent with resistive indices within normal limits.      Common hepatic artery resistive index: 0.8 and systolic acceleration time 92 msec (previously 0.82, 33)      Right hepatic artery resistive index: 0.77 and systolic acceleration time 133 msec (previously 0.75, 21)      Left hepatic artery resistive index: 0.73 and systolic acceleration time 58 msec (previously 0.50, 96) - Visualized proximal aorta:  unremarkable OTHER: No ascites.     Significantly limited examination due to patient body habitus and inability to partly participate in exam. - Sluggish flow in the portal venous system. The main portal vein and right portal vein are small in caliber/diminutive but patent. Small caliber of the portal venous system could be associated with portal hypertension. Clinical correlation is recommended. - Patent hepatic arteries and hepatic veins. The hepatic veins are also difficult to visualize secondary to limitations of the study. - Coarse echotexture of the liver parenchyma which could be seen secondary to chronic liver disease.. - Limited evaluation of the kidneys. They appear to be small in size and increased in echogenicity which can be seen with chronic medical renal disease. - Nonspecific mild common bile duct dilatation which could be seen secondary to postcholecystectomy state. Clinical correlation recommended.     XR Chest 2 views  Result Date: 01/15/2024  EXAM: XR CHEST 2 VIEWS ACCESSION: [id number] UN REPORT DATE: 01/15/2024 5:29 PM CLINICAL INDICATION: ALTERED MENTAL STATUS  TECHNIQUE: PA and Lateral Chest Radiographs COMPARISON: Chest radiograph 11/23/2022 and 11/20/2022 FINDINGS: Enlarged cardiac silhouette. No focal pulmonary consolidation. Mildly enlarged right hemidiaphragm. No sizable pleural effusion or pneumothorax.     Cardiomegaly. Mildly enlarged right hemidiaphragm. No focal pulmonary consolidation.     ECG 12 Lead  Result Date: 01/15/2024  ATRIAL FLUTTER WITH VARIABLE A-V BLOCK WITH PREMATURE VENTRICULAR OR ABERRANTLY CONDUCTED BEATS LEFT AXIS DEVIATION RIGHT BUNDLE BRANCH BLOCK POSSIBLE ANTEROLATERAL INFARCT  , AGE UNDETERMINED ABNORMAL ECG WHEN COMPARED WITH ECG OF 01-Oct-2023 09:27, NON-SPECIFIC ST/T WAVE CHANGES MORE PROMINENT Confirmed by Cleotilde Moccasin ([id number]) on 01/15/2024 3:03:35 PM    ______________________________________________________________________  Discharge Instructions:   Activity Instructions       Activity as tolerated                       Follow Up instructions and Outpatient Referrals     Call  MD for:  difficulty breathing, headache or visual disturbances      Call MD for:  persistent nausea or vomiting      Call MD for:  severe uncontrolled pain      Call MD for:  temperature >38.5 Celsius      Discharge instructions          Appointments which have been scheduled for you      Jan 24, 2024  COLONOSCOPY, FLEXIBLE, PROXIMAL TO SPLENIC FLEXURE; DIAGNOSTIC, W/WO COLLECTION SPECIMEN BY BRUSH OR WASH, UGI ENDOSCOPY; WITH BIOPSY, SINGLE OR MULTIPLE with Prentice CHRISTELLA Feeling, MD  GI PERIOP [facility] REGION) [address]  [place] [address]  [phone]   COLONOSCOPY, FLEXIBLE, PROXIMAL TO SPLENIC FLEXURE; DIAGNOSTIC, W/WO  COLLECTION SPECIMEN BY BRUSH OR WASH None     Feb 12, 2024 11:20 AM  (Arrive by 11:05 AM)  NEW EP with [facility] EP FELLOW DR LOREAN  [facility] CARDIOLOGY [place] [facility] REGION) [address]  [facility] [address]  [place] [address]  [phone]        Feb 13, 2024 8:20 AM  (Arrive by 8:05 AM)  RETURN ENDOCRINE with Ponce Arnulfo Kid, MD  [place] [facility]OCR[facility]OLOG[facility] REGION) [address] [place] [address]  [place] [address]  [phone]        Mar 11, 2024 1:00 PM  (Arrive by 12:45 PM)  NEW PODITARY with Kayla Artist Setters, DPM  [facility] [place]) [address]  [address]  [place] [address]  [phone] ______________________________________________________________________  Discharge Day Services:  BP 111/75  - Pulse 81  - Temp 36.7 ??C (98.1 ??F) (Oral)  - Resp 18  - Ht 177.8 cm (5' 10)  - Wt (!) 122.5 kg (270 lb)  - SpO2 99%  - BMI 38.74 kg/m??     Pt seen on the day of discharge and determined appropriate for discharge.    Condition at Discharge: good    Length of Discharge: I spent greater than 30 mins in the discharge of this patient.    Artist Ill, MD  PGY1 Internal Medicine

## 2024-01-21 NOTE — Unmapped (Signed)
 Tacrolimus  Therapeutic Monitoring Pharmacy Note    Richard [name] is a 58 y.o. male continuing tacrolimus .     Indication: Liver transplant     Date of Transplant: 02/2012      Prior Dosing Information: Home regimen tacrolimus  1 mg BID     Source(s) of information used to determine prior to admission dosing: Clinic Note, Fill HIstory, or MAR    Goals:  Therapeutic Drug Levels  Tacrolimus  trough goal: 2-4 ng/mL    Additional Clinical Monitoring/Outcomes  Monitor renal function (SCr and urine output) and liver function (LFTs)  Monitor for signs/symptoms of adverse events (e.g., hyperglycemia, hyperkalemia, hypomagnesemia, hypertension, headache, tremor)    Previous Lab Values  Tacrolimus , Trough   Date/Time Value Ref Range Status   01/21/2024 04:49 AM 6.9 5.0 - 15.0 ng/mL Final   01/20/2024 05:22 AM 6.1 5.0 - 15.0 ng/mL Final   01/18/2024 03:51 AM 7.0 5.0 - 15.0 ng/mL Final   01/16/2024 06:02 AM 5.2 5.0 - 15.0 ng/mL Final   12/04/2022 06:32 AM 5.2 5.0 - 15.0 ng/mL Final   10/06/2014 09:00 AM 6.8 <=20.0 ng/mL Final   08/31/2014 08:34 AM 4.2  Final   08/03/2014 08:22 AM 4.7  Final   07/07/2014 08:37 AM 4.2  Final   06/09/2014 08:27 AM 3.5  Final     Tacrolimus , Timed   Date/Time Value Ref Range Status   01/19/2024 08:24 AM 5.3 ng/mL Final   01/11/2024 09:43 AM 8.9 ng/mL Final   12/24/2023 10:23 AM 18.1 ng/mL Final   12/04/2023 12:54 PM 3.6 ng/mL Final       Result:  Tacrolimus  level from today was drawn ~5 hours early     Pharmacokinetic Considerations and Significant Drug Interactions:  Concurrent CYP3A4 substrates/inhibitors: None identified    Assessment/Plan:  Recommendedation(s)  Continue current regimen of tacrolimus  1 mg BID  Tacrolimus  level drawn early today, so unable to assess true trough. Patient has been stable on current outpatient regimen of tacrolimus  1 mg BID, so will continue current dose and follow-up on next tac level ordered 01/23/24    Follow-up  MWF levels ordered.   A pharmacist will continue to monitor and recommend levels as appropriate    Please page service pharmacist with questions/clarifications.    Altheia Shafran, PharmD

## 2024-01-21 NOTE — Unmapped (Signed)
 Shift Summary  Pain management was addressed with multiple medications, resulting in a slight decrease in pain levels.    Safety measures were consistently maintained, preventing any falls or injuries.    Lactulose  and senna were refused by the patient.    Glucose levels were monitored, with a reading of 178 mg/dL at 7:89 AM.    Overall, the patient remained stable with no significant incidents during the shift.     Absence of Fall and Fall-Related Injury: No falls or fall-related injuries occurred during the shift, with consistent use of safety measures such as call light within reach, bed wheels locked, and non-skid footwear. Hourly visual checks confirmed the patient was mostly awake and in bed.     Optimal Pain Control and Function: Pain levels fluctuated but showed a slight decrease after administration of acetaminophen , oxyCODONE , and HYDROmorphone , with pain primarily located in the abdomen and chest.

## 2024-01-22 DIAGNOSIS — Z09 Encounter for follow-up examination after completed treatment for conditions other than malignant neoplasm: Principal | ICD-10-CM

## 2024-01-22 DIAGNOSIS — Z944 Liver transplant status: Principal | ICD-10-CM

## 2024-01-22 DIAGNOSIS — K7682 Hepatic encephalopathy: Principal | ICD-10-CM

## 2024-01-22 MED ORDER — LACTULOSE 20 GRAM/30 ML ORAL SOLUTION
Freq: Three times a day (TID) | ORAL | 3 refills | 90.00000 days | Status: CP
Start: 2024-01-22 — End: 2024-01-22

## 2024-01-22 MED ORDER — PANTOPRAZOLE 40 MG TABLET,DELAYED RELEASE
ORAL_TABLET | Freq: Every day | ORAL | 0 refills | 90.00000 days | Status: CP
Start: 2024-01-22 — End: 2024-04-21
  Filled 2024-01-21: qty 90, 90d supply, fill #0

## 2024-01-22 MED ORDER — LIDOCAINE 4 % TOPICAL PATCH
MEDICATED_PATCH | Freq: Every day | TRANSDERMAL | 30.00000 days | Status: CN
Start: 2024-01-22 — End: ?

## 2024-01-22 MED ORDER — FAMOTIDINE 10 MG TABLET
ORAL_TABLET | Freq: Every day | ORAL | 0 refills | 90.00000 days | Status: CP
Start: 2024-01-22 — End: 2024-04-21
  Filled 2024-01-21: qty 60, 60d supply, fill #0

## 2024-01-22 MED ORDER — LACTULOSE 20 GRAM ORAL PACKET
PACK | Freq: Three times a day (TID) | ORAL | 3 refills | 90.00000 days | Status: CP
Start: 2024-01-22 — End: ?

## 2024-01-22 NOTE — Unmapped (Addendum)
 PA submitted for lactulose  packets via CMM Key: [id number]    Outcome  Denied today by [facility]  Denied

## 2024-01-22 NOTE — Unmapped (Addendum)
 Per PA Minnie patient to have MRI with elastography (not able to be completed inpatient).  Secured appt for next available MRI elastography (on MWF, non dialysis days) on Sept 29 - updated patient by phone who was agreeable.    Patient confirms he is taking xifaxin and lactulose , he notes he needs refill of lactulose  - noted powder was scripted to [facility] @ discharge however not covered by insurance.  Per patient he prefers powder over solution - confirmed with [facility] powder form NOT covered by insurance - request sent to MAPS team to attempt prior auth as patient prefers powder over solution (this medication is managed by hepatology).  Discussed will attempt PA however ultimately insurance controls which is covered - asked if he needs refill of solution at this time to ensure he does not run out while PA attempts in progress, he declined.

## 2024-01-23 NOTE — Unmapped (Signed)
-----   Message from Oliva Sprinkles, MD sent at 01/23/2024 10:32 AM EDT -----  Regarding: FW: Outpatient Follow Up: [place]  Contact: [phone]  Yes that's fine. Its not very time-sensitive if it would be easier to make the appointment later.  ----- Message -----  From: Jacquline Hollering, RN  Sent: 01/23/2024   9:36 AM EDT  To: Daron LITTIE Ringer, RN; Oliva Sprinkles CLOSS  Subject: RE: Outpatient Follow Up: [place]                 Patient has been scheduled on 02/05/24. Is this an acceptable timeframe?  Thanks!  ----- Message -----  From: Sprinkles Oliva, MD  Sent: 01/22/2024   6:30 PM EDT  To: Daron LITTIE Ringer, RN; Hollering Macke#  Subject: FW: Outpatient Follow Up: [place]                 Please have this gentleman follow up with me in clinic to evaluate for new access. Thanks  ----- Message -----  From: Delores Slater ORN  Sent: 01/21/2024   1:26 PM EDT  To: Oliva Sprinkles, MD  Subject: Outpatient Follow Up: [place]                     Hi Dr. Sprinkles,    Good afternoon. This is Slater Delores - I am a M3 on the Med W team here at [facility]. We have been taking care of one of your patients, Richard [name], who presented to us  on 7/25 with AMS 2/2 to hepatic encephalopathy.     At presentation he also had extreme RUE pain, from underlying thrombophlebitis of his RUE AVF. VIR performed a dialysis fistulogram with attempted declot on 7/27 but were unable to successfully declot the site fully. He is now recommended to follow up outpt with you for further intervention. Please let me know if you are able to get him scheduled with you for follow up.      Happy to answer any other questions about his hospital course.    Thank you so much for your help.    Benjaman Slater

## 2024-01-24 ENCOUNTER — Encounter
Admit: 2024-01-24 | Discharge: 2024-01-24 | Payer: BLUE CROSS/BLUE SHIELD | Attending: Anesthesiology | Primary: Anesthesiology

## 2024-01-24 ENCOUNTER — Inpatient Hospital Stay: Admit: 2024-01-24 | Discharge: 2024-01-24 | Payer: BLUE CROSS/BLUE SHIELD

## 2024-01-24 DIAGNOSIS — K7682 Hepatic encephalopathy: Principal | ICD-10-CM

## 2024-01-24 MED ORDER — LACTULOSE 20 GRAM ORAL PACKET
Freq: Three times a day (TID) | ORAL | 3 refills | 0.00000 days | Status: CP
Start: 2024-01-24 — End: 2024-01-24

## 2024-01-24 MED ADMIN — lidocaine (PF) (XYLOCAINE-MPF) 20 mg/mL (2 %) injection: INTRAVENOUS | @ 13:00:00 | Stop: 2024-01-24

## 2024-01-24 MED ADMIN — fentaNYL (PF) (SUBLIMAZE) injection: INTRAVENOUS | @ 13:00:00 | Stop: 2024-01-24

## 2024-01-24 MED ADMIN — Propofol (DIPRIVAN) injection: INTRAVENOUS | @ 13:00:00 | Stop: 2024-01-24

## 2024-01-24 MED ADMIN — ePHEDrine (PF) 25 mg/5 mL (5 mg/mL) in 0.9% sodium chloride syringe: INTRAVENOUS | @ 13:00:00 | Stop: 2024-01-24

## 2024-01-24 MED ADMIN — phenylephrine 1 mg/10 mL (100 mcg/mL) injection Syrg: INTRAVENOUS | @ 13:00:00 | Stop: 2024-01-24

## 2024-01-24 MED ADMIN — propofol (DIPRIVAN) infusion 10 mg/mL: INTRAVENOUS | @ 13:00:00 | Stop: 2024-01-24

## 2024-01-24 MED ADMIN — succinylcholine (ANECTINE) injection: INTRAVENOUS | @ 13:00:00 | Stop: 2024-01-24

## 2024-01-24 MED ADMIN — sodium chloride (NS) 0.9 % infusion: 10 mL/h | INTRAVENOUS | @ 12:00:00 | Stop: 2024-01-24

## 2024-01-25 MED ORDER — LACTULOSE 10 GRAM/15 ML ORAL SOLUTION
Freq: Three times a day (TID) | ORAL | 0 refills | 90.00000 days | Status: CP
Start: 2024-01-25 — End: 2024-04-24

## 2024-01-25 NOTE — Unmapped (Signed)
 Copied from CRM #[id number]. Topic: Other - Other  >> Jan 25, 2024  8:18 AM Hargis HERO wrote:  Ermalene from [facility] PA department is calling to provide info regarding PA for medication      Medication Question/ Issue from Patient    Name of medication: lactulose  (CEPHULAC ) 20 gram packet   Caller described issue:PA has been denied .The liquid is preferred -      PCP: LEE, JASON  Last encounter in department: Visit date not found  (If more than a year, offer an appointment.)

## 2024-01-26 ENCOUNTER — Encounter
Admit: 2024-01-26 | Discharge: 2024-02-03 | Disposition: A | Discharge status: Home / Self Care | Payer: BLUE CROSS/BLUE SHIELD | Attending: Critical Care Medicine

## 2024-01-26 ENCOUNTER — Ambulatory Visit: Admit: 2024-01-26 | Payer: BLUE CROSS/BLUE SHIELD

## 2024-01-26 ENCOUNTER — Ambulatory Visit: Admit: 2024-01-26 | Discharge: 2024-02-03 | Payer: BLUE CROSS/BLUE SHIELD

## 2024-01-26 ENCOUNTER — Ambulatory Visit
Admit: 2024-01-26 | Discharge: 2024-02-03 | Disposition: A | Discharge status: Home / Self Care | Payer: BLUE CROSS/BLUE SHIELD

## 2024-01-26 ENCOUNTER — Inpatient Hospital Stay
Admit: 2024-01-26 | Discharge: 2024-02-03 | Disposition: A | Discharge status: Home / Self Care | Payer: BLUE CROSS/BLUE SHIELD

## 2024-01-26 LAB — CBC W/ AUTO DIFF
BASOPHILS ABSOLUTE COUNT: 0 10*9/L (ref 0.0–0.1)
BASOPHILS RELATIVE PERCENT: 0.8 %
EOSINOPHILS ABSOLUTE COUNT: 0.2 10*9/L (ref 0.0–0.5)
EOSINOPHILS RELATIVE PERCENT: 3.9 %
HEMATOCRIT: 36.6 % — ABNORMAL LOW (ref 39.0–48.0)
HEMOGLOBIN: 12.4 g/dL — ABNORMAL LOW (ref 12.9–16.5)
LYMPHOCYTES ABSOLUTE COUNT: 0.5 10*9/L — ABNORMAL LOW (ref 1.1–3.6)
LYMPHOCYTES RELATIVE PERCENT: 9.7 %
MEAN CORPUSCULAR HEMOGLOBIN CONC: 34 g/dL (ref 32.0–36.0)
MEAN CORPUSCULAR HEMOGLOBIN: 29.6 pg (ref 25.9–32.4)
MEAN CORPUSCULAR VOLUME: 87.3 fL (ref 77.6–95.7)
MEAN PLATELET VOLUME: 8.6 fL (ref 6.8–10.7)
MONOCYTES ABSOLUTE COUNT: 0.5 10*9/L (ref 0.3–0.8)
MONOCYTES RELATIVE PERCENT: 8.7 %
NEUTROPHILS ABSOLUTE COUNT: 4.2 10*9/L (ref 1.8–7.8)
NEUTROPHILS RELATIVE PERCENT: 76.9 %
PLATELET COUNT: 200 10*9/L (ref 150–450)
RED BLOOD CELL COUNT: 4.2 10*12/L — ABNORMAL LOW (ref 4.26–5.60)
RED CELL DISTRIBUTION WIDTH: 13.6 % (ref 12.2–15.2)
WBC ADJUSTED: 5.5 10*9/L (ref 3.6–11.2)

## 2024-01-26 LAB — HIGH SENSITIVITY TROPONIN I - 4 HOUR SERIAL
HIGH SENSITIVITY TROPONIN - DELTA (2-6H): 5 ng/L (ref <=7)
HIGH-SENSITIVITY TROPONIN I - 6 HOUR: 50 ng/L (ref <=53)

## 2024-01-26 LAB — PROTIME-INR
INR: 1.12
PROTIME: 12.8 s — ABNORMAL HIGH (ref 9.9–12.6)

## 2024-01-26 LAB — COMPREHENSIVE METABOLIC PANEL
ALBUMIN: 3.4 g/dL (ref 3.4–5.0)
ALKALINE PHOSPHATASE: 312 U/L — ABNORMAL HIGH (ref 46–116)
ALT (SGPT): <7 U/L — ABNORMAL LOW (ref 10–49)
ANION GAP: 15 mmol/L — ABNORMAL HIGH (ref 5–14)
AST (SGOT): 15 U/L (ref <=34)
BILIRUBIN TOTAL: 0.8 mg/dL (ref 0.3–1.2)
BLOOD UREA NITROGEN: 15 mg/dL (ref 9–23)
BUN / CREAT RATIO: 2
CALCIUM: 8.4 mg/dL — ABNORMAL LOW (ref 8.7–10.4)
CHLORIDE: 99 mmol/L (ref 98–107)
CO2: 23 mmol/L (ref 20.0–31.0)
CREATININE: 7.43 mg/dL — ABNORMAL HIGH (ref 0.73–1.18)
EGFR CKD-EPI (2021) MALE: 8 mL/min/1.73m2 — ABNORMAL LOW (ref >=60)
GLUCOSE RANDOM: 281 mg/dL — ABNORMAL HIGH (ref 70–179)
POTASSIUM: 3.8 mmol/L (ref 3.4–4.8)
PROTEIN TOTAL: 7.3 g/dL (ref 5.7–8.2)
SODIUM: 137 mmol/L (ref 135–145)

## 2024-01-26 LAB — HIGH SENSITIVITY TROPONIN I - 2H/6H SERIAL
HIGH SENSITIVITY TROPONIN - DELTA (0-2H): 1 ng/L (ref <=7)
HIGH-SENSITIVITY TROPONIN I - 2 HOUR: 55 ng/L (ref <=53)

## 2024-01-26 LAB — MAGNESIUM: MAGNESIUM: 2.2 mg/dL (ref 1.6–2.6)

## 2024-01-26 LAB — AMMONIA: AMMONIA: 59 umol/L — ABNORMAL HIGH (ref 11–32)

## 2024-01-26 LAB — HIGH SENSITIVITY TROPONIN I - SINGLE: HIGH SENSITIVITY TROPONIN I: 56 ng/L (ref <=53)

## 2024-01-26 LAB — PRO-BNP: PRO-BNP: 17137 pg/mL — ABNORMAL HIGH (ref <=300.0)

## 2024-01-26 LAB — TSH: THYROID STIMULATING HORMONE: 3.193 u[IU]/mL (ref 0.550–4.780)

## 2024-01-26 LAB — LIPASE: LIPASE: 20 U/L (ref 12–53)

## 2024-01-26 MED ADMIN — lactulose (CEPHULAC) packet 20 g: 20 g | ORAL | @ 19:00:00 | Stop: 2024-01-26

## 2024-01-26 NOTE — Unmapped (Signed)
 Internal Medicine (MEDW) History & Physical    Assessment & Plan:   Richard [name] is a 58 y.o. male whose presentation is complicated by cirrhosis status post liver transplant in 2013, type 2 diabetes, ESRD on HD (TTS) that presented to [facility] with rigors and chills .     Principal Problem:    Chills  Active Problems:    Type 2 diabetes mellitus with hyperglycemia, with long-term current use of insulin        Liver replaced by transplant       Class 3 obesity    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)    ESRD (end stage renal disease) on dialysis       Type 2 MI (myocardial infarction)       (HFpEF) heart failure with preserved ejection fraction       Elevated liver enzymes    Hyperphosphatemia    Left arm swelling    Rigors        Active Problems    #Chills-Rigors- HE v Infection of Unknown Source  Patient presenting with approximately a day of increasing chills and rigors.  Denies any fever.  Labs on admission appear to be largely within the patient's baseline.  On exam he is A&O x 4 without any slowing or slurring of speech patient states that he has never experienced symptoms like this before and this is not similar to his prior presentation where he was actually encephalopathic.  Ammonia elevated to 59 however should be noted that this is a poor indicator of encephalopathy. In addition lower concern for uremia causing encephalopathy as BUN wnl. Unclear whether patient has asterixis on exam as generalized rigors interfering. Given patient's mental status being at baseline however I am inclined to conclude that this is not HE but rather infection of unknown source. Admittedly, patient without leukocytosis to support this however could be delayed or confounded by immunosuppressive regimen. Possible sources include intraabdominal, given the patients mild tenderness on exam, and skin soft tissue given the patient's endorsement of new swelling in his left arm. Blood cultures obtained in the ED. Will round out infectious workup with urine studies. Could consider consulting medicine procedure team in AM for a fluid search for diagnostic para however patients abdomen is soft and nondistended on exam making a tapable pocket somewhat unlikely. Lastly, will obtain pvls of the left upper extremity given swelling and initiate empiric antibiotics pending culture data.  - Start Zosyn  (Renally dosed) 2.25g q8h  - Start Vancomycin . Appreciate pharmacy assistance with dosing  - fu blood cx  - Obtain urinalysis with culture  - RPP  - CXR unconcerning for pulmonary infxn    #Hx of Liver Transplant (2013)- Question of Cirrhosis of the transplanted Liver  Patient with hx of liver transplant in 2013. Admitted last week with HE raising concern for cirrhosis of the transplanted liver and initiated on xifaxan  along with existing lactulose . Biopsy results not consistent with cirrhosis and only remarkable for mild macrovesicular steatosis (5%). Patient recommended to undergo MRI abdomen with elastography for further evaluation as a result. Small nonbleeding varices in esophagus on EGD 7/31. Additionally initiated on ursodiol  given positive AMA and elevated ALP however no findings on biopsy to suggest PBC. Will plan to consult hepatology for assistance in management and consideration of completing MRI while here. In addition, will continue home immunosuppression regimen and encephalopathy ppx.  - Cont. Home Tacrolimus  1mg  BID. Appreciate pharmacy assistance with dosing  - Cont. Home Mycophenolate  250mg  BID  -  Cont. Home Ursodiol  600mg  BID  - Cont. Home Lactulose  20g TID (Titrate to 3-5 Bms daily)  - Cont. Home xifaxan  550mg  BID  - Cs Hepatology in AM    #ESRD on HD TTS  Patient recently completed dialysis session this AM. Do not feel that uremia is playing a role in his presentation as above. Will consult nephrology for continuation of dialysis while inpatient.  - Nephrology cs in AM  - Cont. Home Renvela  1600mg  TID    #Insulin -Dependent T2DM  Patient taking regimen of mounjaro , 60u of tresiba , and 35u of novolog  with meals at home. Will initiate sliding scale in basal glargine at 25% dosing to minimize risk of hypoglycemia. Can titrate further based off of insulin  needs  - 15U Glargine nightly  - SSI  - Titrate insulin  as needed  - HOLD Glp1    #Type II MI  Patient with troponin peak of 56 in the ED.  Now downtrending.  Denies chest pain.  No need for further monitoring    #HFpEF (EF >55%)  Patient with elevated pro-BNP to 17,137 on admission. Mild pulmonary vascular congestion seen on CXR however proBNP appears to be chronically elevated given previous ProBNP of ~14000 at last admission. Last echo w EF >55%. Does not endorse shortness of breath at this time. Will continue to monitor  - CTM    Chronic Problems    #HTN: Cont. Home amlodipine   #Chronic Anemia: Likely in setting of renal disease. Cont to monitor with daily cbc.    The patient's presentation is complicated by the following clinically significant conditions requiring additional evaluation and treatment: - Chronic kidney disease POA requiring further investigation, treatment, or monitoring   - Anemia requiring at least daily CBC for further monitoring  - Immunocompromise state POA requiring further investigation, treatment, or monitoring         Issues Impacting Complexity of Management:  -Intensive monitoring of drug toxicity from Tacrolimus  with scheduled levels        Checklist:  Diet: Regular Diet  DVT PPx: Heparin  5000units q8h  Code Status: Full Code  Dispo: Patient appropriate for Inpatient based on expectation of ongoing need for hospitalization greater than two midnights based on severity of presentation/services including rigors/chills    Team Contact Information:   Primary Team: Internal Medicine (MEDW)  Primary Resident: Waddell Pouch, MD  Resident's Pager: [phone] [facility] Senior Resident)    Chief Concern:   Chills      Subjective:   Richard [name] is a 58 y.o. male with pertinent PMHx of history of cirrhosis status post liver transplant in 2013, type 2 diabetes, ESRD on HD (TTS) presenting with confusion and tremors.        HPI:  Richard [name] is a 58 year old male with past medical history of cirrhosis status post liver transplant in 2013, type 2 diabetes, ESRD on HD (TTS) presenting with confusion and tremors.    Patient states that he first began feeling symptoms yesterday.  However he went about his usual activities going to dialysis today and began noticing increasing tremors in his upper and lower extremities.  Additionally  he is endorsing body aches and worsening swelling in his left upper extremity that is also began yesterday. On ROS positive for chills, generalized abdominal pain, and malaise. Patient denies fever, shortness of breath, or chest pain.     Of note, patient with recent admission for HE in approximately a week ago in the setting of gait disturbance and AMS. It should  be noted that patient states that these symptoms do not feel at all similar to what he experienced then. Patient was subsequently started on xifaxan  and his lactulose  was continued with resolution of his symptoms. In addition he was found to have a right fistula clot of the RUE and thrombophlebitis and discharged on Keflex  for treatment. During that admission, biopsy of his transplanted liver was not consistent with cirrhosis but only mild steatosis. Lastly, patient also tested positive for AMA however biopsy findings were not consistent with PBC either.      In the ED:  Vitals: Temp 98.2, HR 64, BP 141/100, RR 16, SpO2 of 99% on room air  Labs: Hgb 12.4, CR 7.43, glucose 281, ALP 312, ammonia 59, troponin 56, proBNP 17,137  Cultures: Cultures x 2 pending  EKG: Aflutter  Imaging: Chest x-ray: Cardiomegaly, mild pulmonary edema and probable small bilateral pleural effusions.  Interventions: Lactulose  20 g x 1        Allergies  Patient has no known allergies.    I reviewed the Medication List. The current list is Accurate  Prior to Admission medications   Medication Dose, Route, Frequency   acetaminophen  (TYLENOL ) 325 MG tablet 650 mg, Oral, Every 8 hours   amlodipine  (NORVASC ) 10 MG tablet 10 mg, Oral, Daily (standard)   [Paused] apixaban  (ELIQUIS ) 5 mg Tab 5 mg, Oral, 2 times a day (standard)  Paused since Mon 01/21/2024 until manually resumed (Hold Reason: Pre-procedure)   atorvastatin  (LIPITOR ) 40 MG tablet 40 mg, Oral, Daily (standard)  Patient not taking: Reported on 01/24/2024   blood-glucose meter,continuous (DEXCOM G6 RECEIVER) Misc 1 each, Miscellaneous, Daily, Dispense 1 receiver annually.  Sent to [facility]   blood-glucose sensor (DEXCOM G7 SENSOR) Devi Use 1 sensor every 10 days.   blood-glucose transmitter (DEXCOM G6 TRANSMITTER) Devi 1 each, Miscellaneous, Every 3 months, [facility] pharmacy   blood-glucose transmitter (DEXCOM G6 TRANSMITTER) Devi Use as directed to monitor blood glucose   carvedilol  (COREG ) 6.25 MG tablet 6.25 mg, 2 times a day (standard)  Patient not taking: Reported on 01/24/2024   CHOLECALCIFEROL, VITAMIN D3, (VITAMIN D3 ORAL) 2,000 Units, Daily (standard)   famotidine  (PEPCID ) 10 MG tablet 10 mg, Oral, Daily (standard)  Patient not taking: Reported on 01/24/2024   gabapentin  (NEURONTIN ) 300 MG capsule 300 mg, Oral, Nightly   insulin  aspart (NOVOLOG  FLEXPEN U-100 INSULIN ) 100 unit/mL (3 mL) injection pen 35 Units, Subcutaneous, 3 times a day (AC)   insulin  degludec (TRESIBA  FLEXTOUCH U-200) 200 unit/mL (3 mL) InPn 60 Units, Subcutaneous, Daily (standard), Max dose of 100 units per day.   lactulose  (CEPHULAC ) 20 gram packet 20 g, Oral, 3 times a day (standard), Target 3-5 bowel movements/day   lactulose  10 gram/15 mL solution 20 g, Oral, 3 times a day (standard)   lanthanum  (FOSRENOL ) 1000 MG chewable tablet 1,000 mg, Oral, 3 times a day (with meals)   multivitamin (TAB-A-VITE/THERAGRAN) per tablet 1 tablet, Daily (standard)   mycophenolate  (CELLCEPT ) 250 mg capsule 250 mg, Oral, 2 times a day (standard)   oxyCODONE  (ROXICODONE ) 5 mg/5 mL solution Take 10 mL (10 mg total) by mouth every four (4) hours as needed for pain.  Patient not taking: Reported on 01/24/2024   pantoprazole  (PROTONIX ) 40 MG tablet 40 mg, Oral, Daily before breakfast  Patient not taking: Reported on 01/24/2024   pen needle, diabetic (TRUEPLUS PEN NEEDLE) 32 gauge x 5/32 (4 mm) Ndle Use with insulin  up to 4 times a day as needed.   pen needle,  diabetic 32 gauge x 5/32 (4 mm) Ndle Use with insulin  up to 4 times/day as needed.   rifAXIMin  (XIFAXAN ) 550 mg Tab 550 mg, Oral, 2 times a day (standard)   sevelamer  (RENVELA ) 800 mg tablet 1,600 mg, Oral, 3 times a day (with meals)   tacrolimus  (PROGRAF ) 0.5 MG capsule 1 mg, Oral, 2 times a day   [Paused] tirzepatide  (MOUNJARO ) 12.5 mg/0.5 mL PnIj 12.5 mg, Subcutaneous, Every 7 days  Paused since Mon 01/21/2024 until manually resumed   [Paused] tirzepatide  (MOUNJARO ) 15 mg/0.5 mL PnIj 15 mg, Subcutaneous, Every 7 days  Paused since Mon 01/21/2024 until manually resumed   tirzepatide  (MOUNJARO ) 7.5 mg/0.5 mL PnIj 7.5 mg, Every 7 days   ursodiol  (ACTIGALL ) 300 mg capsule 600 mg, Oral, 2 times a day (standard)       Librarian, academic:  Richard [name] currently has decisional capacity for healthcare decision-making and is able to designate a surrogate healthcare decision maker. Richard [name] designated healthcare decision maker(s) is/are Olam Ohm (the patient's adult sibling) as denoted by stated patient preference.    Objective:   Physical Exam:  Temp:  [36.3 ??C (97.4 ??F)-36.8 ??C (98.2 ??F)] 36.3 ??C (97.4 ??F)  Pulse:  [60-88] 66  SpO2 Pulse:  [64-70] 67  Resp:  [16-18] 16  BP: (107-145)/(63-100) 107/63  SpO2:  [95 %-99 %] 95 %    Gen: Uncomfortable appearing. Rigoring  Eyes: Sclera anicteric, EOMI grossly normal   HENT: Atraumatic, normocephalic  Neck: Trachea midline  Heart: RRR  Lungs: CTAB, no crackles or wheezes  Abdomen: Mild Tenderness to palpation  Extremities: Swelling of LUE. No erythema  Neuro: Grossly symmetric, non-focal    Skin:  No rashes, lesions on clothed exam  Psych: Alert, oriented     Waddell Slider, MD, MPH, MS  [facility] Internal Medicine PGY3

## 2024-01-26 NOTE — Unmapped (Addendum)
 Pt has ammoniac breath. Last took his lactulose  yesterday. Liver transplant pt w/ ESRD. Last dialysis today prior to arrival. Pt reports driving self to the ED. Pt reports out of lactulose  and cannot afford the refill.

## 2024-01-26 NOTE — Unmapped (Signed)
 Vancomycin  Therapeutic Monitoring Pharmacy Note    Bayler Gehrig is a 58 y.o. male starting vancomycin . Date of therapy initiation: 01/26/2024    Indication: unknown infection    Prior Dosing Information: None/new initiation     Goals:  Therapeutic Drug Levels  Vancomycin  trough goal: 10-15 mg/L    Additional Clinical Monitoring/Outcomes  Renal function, volume status (intake and output)    Results: Not applicable    Wt Readings from Last 1 Encounters:   01/26/24 (!) 123.2 kg (271 lb 9.7 oz)     Creatinine   Date Value Ref Range Status   01/26/2024 7.43 (H) 0.73 - 1.18 mg/dL Final   92/71/7974 87.02 (H) 0.73 - 1.18 mg/dL Final   92/72/7974 89.19 (H) 0.73 - 1.18 mg/dL Final        Pharmacokinetic Considerations and Significant Drug Interactions:  Per linear dose adjustment  Concurrent nephrotoxic meds: not applicable    Assessment/Plan:  Recommendation(s)  Start vancomycin  1250 mg IV once  Estimated trough on recommended regimen: Not applicable - dosing by level    Follow-up  Level due: random level scheduled for tomorrow until iHD plan is determined  A pharmacist will continue to monitor and order levels as appropriate    Please page service pharmacist with questions/clarifications.    Suzen DELENA Compton, PharmD

## 2024-01-26 NOTE — Unmapped (Signed)
 Pt complaining of generalized tremors starting yesterday, getting progressively worse. Endorses some pain in arms, denies SOB.

## 2024-01-26 NOTE — Unmapped (Signed)
 Tacrolimus  Therapeutic Monitoring Pharmacy Note    Richard [name] is a 58 y.o. male continuing tacrolimus .     Indication: Liver transplant     Date of Transplant: 02/2012      Prior Dosing Information: Home regimen tacrolimus  1 mg BID      Source(s) of information used to determine prior to admission dosing: Home Medication List, Fill HIstory, or MAR    Goals:  Therapeutic Drug Levels  Tacrolimus  trough goal: 2-4 ng/mL    Additional Clinical Monitoring/Outcomes  Monitor renal function (SCr and urine output) and liver function (LFTs)  Monitor for signs/symptoms of adverse events (e.g., hyperglycemia, hyperkalemia, hypomagnesemia, hypertension, headache, tremor)    Previous Lab Values  Tacrolimus , Trough   Date/Time Value Ref Range Status   01/21/2024 04:49 AM 6.9 5.0 - 15.0 ng/mL Final   01/20/2024 05:22 AM 6.1 5.0 - 15.0 ng/mL Final   01/18/2024 03:51 AM 7.0 5.0 - 15.0 ng/mL Final   01/16/2024 06:02 AM 5.2 5.0 - 15.0 ng/mL Final   12/04/2022 06:32 AM 5.2 5.0 - 15.0 ng/mL Final   10/06/2014 09:00 AM 6.8 <=20.0 ng/mL Final   08/31/2014 08:34 AM 4.2  Final   08/03/2014 08:22 AM 4.7  Final   07/07/2014 08:37 AM 4.2  Final   06/09/2014 08:27 AM 3.5  Final     Tacrolimus , Timed   Date/Time Value Ref Range Status   01/19/2024 08:24 AM 5.3 ng/mL Final   01/11/2024 09:43 AM 8.9 ng/mL Final   12/24/2023 10:23 AM 18.1 ng/mL Final   12/04/2023 12:54 PM 3.6 ng/mL Final       Result:  Tacrolimus  level from 7/28 was drawn 5 hours early     Pharmacokinetic Considerations and Significant Drug Interactions:  Concurrent CYP3A4 substrates/inhibitors: None identified    Assessment/Plan:  Recommendedation(s)  Continue current regimen of tacrolimus  1 mg BID, typically stable on home regimen    Follow-up  Next level should be ordered on 01/27/24 at 0700.   A pharmacist will continue to monitor and recommend levels as appropriate    Please page service pharmacist with questions/clarifications.    Suzen DELENA Compton, PharmD

## 2024-01-26 NOTE — Unmapped (Addendum)
 [facility]  Emergency Department Provider Note    ED Clinical Impression     Final diagnoses:   None       HPI, ED Course, Assessment and Plan     Initial Clinical Impression:    January 26, 2024 2:27 PM   Richard [name] is a 58 y.o. male with past medical history of past medical history of Hollie cirrhosis status post liver transplant in 2013, type 2 diabetes, end-stage renal disease on HD presenting with confusion and tremors.    Patient had a recent admission for very similar presentation where he was found to have an elevated ammonia.  He states that he started feeling bad yesterday.  Started noticing tremors in his upper and lower extremities.  States that he cannot stop the shaking.  States that he just feels generally unwell.  States that he had dialysis today.  Endorsing body aches and swelling in his right upper extremity.  On chart review patient had an admission recently and was discharged on 01/21/2024 for hepatic encephalopathy.  Patient has been having workup for possible cell-mediated rejection however no evidence thus far.  Looks like they are planning an outpatient MRI to further evaluate his liver.     On physical exam he is hemodynamically stable and afebrile.  Lungs are clear to auscultation bilaterally without any wheezing or crackles.  He has mild tremor to his upper extremities bilaterally.  Abdomen is soft, nondistended, diffusely tender.  He has swelling in his right upper extremity, palpable pulses.    BP 124/71  - Pulse 55  - Temp 36.3 ??C (97.4 ??F)  - Resp 16  - Wt (!) 123.2 kg (271 lb 9.7 oz) Comment: after dialysis - SpO2 95%  - BMI 38.97 kg/m??     Medical Decision Making  Presenting with tremors, confusion, generalized pain.  Differential diagnosis includes but is not limited to hepatic encephalopathy which I think is quite likely.  Will get labs, evaluate ammonia level evaluate for other electrolyte abnormalities.  Considered infectious ideology including SBP, however patient not favoring, no tachycardia, no focal infectious symptoms, will get infectious workup, blood cultures but defer antibiotics as suspicion for infection is low. Patient recently had a very elevated BNP, likely secondary to volume overload.  Last echocardiogram was 11/17/2022 and it had an EF of greater than 55%.  Would consider inpatient repeat echocardiogram.  Further ED updates and updates to plan as per ED Course below:    ED Course:   CMP with labs consistent with end-stage renal disease.  Ammonia elevated at 59 likely causing hepatic encephalopathy.  Glucose elevated at 284, labs not consistent with DKA.  CBC without evidence of significant anemia.  Initial troponin elevated at 55, repeat was 56.  No chest pain, low suspicion for ACS at this time.  EKG with rate of 65, A-fib with RBBB.  No significant ST elevation or depression, when comparing to EKG 01/17/2024 no significant changes found      Independent Interpretation of Studies: I have independently interpreted the following studies:  Chest x-ray with mild pulmonary edema, no focal consolidation or evidence of infection.    Discussion of Management With Other Providers or Support Staff: I discussed the management of this patient with the:  Dr. Trudy, internal medicine he was admitted patient    Considerations Regarding Disposition/Escalation of Care and Critical Care:  Patient requiring inpatient admission for hepatic encephalopathy    Social Drivers of Health with Concerns  Tobacco Use: Low Risk  (01/26/2024)    Patient History     Smoking Tobacco Use: Never     Smokeless Tobacco Use: Never     Passive Exposure: Past   Recent Concern: Tobacco Use - Medium Risk (01/15/2024)    Patient History     Smoking Tobacco Use: Former     Smokeless Tobacco Use: Never     Passive Exposure: Past   Physical Activity: Insufficiently Active (07/29/2020)    Received from [facility] visits prior to 08/26/2022.    Exercise Vital Sign     On average, how many days per week do you engage in moderate to strenuous exercise (like a brisk walk)?: 2 days     On average, how many minutes do you engage in exercise at this level?: 20 min   Substance Use: Not on file (05/02/2023)   Social Connections: Unknown (07/10/2022)    Received from [facility]    Social Network     Social Network: Not on file   Internet Connectivity: Not on file     _____________________________________________________________________    The case was discussed with the attending physician who is in agreement with the above assessment and plan    Past History     PAST MEDICAL HISTORY/PAST SURGICAL HISTORY:   Past Medical History[1]    Past Surgical History[2]    MEDICATIONS:     Current Facility-Administered Medications:     acetaminophen  (TYLENOL ) tablet 650 mg, 650 mg, Oral, Q6H PRN, Trudy Birmingham, MD    aluminum -magnesium  hydroxide-simethicone  (MAALOX MAX) 80-80-8 mg/mL oral suspension, 30 mL, Oral, Q4H PRN, Trudy Birmingham, MD    amlodipine  (NORVASC ) tablet 10 mg, 10 mg, Oral, Daily, Trudy Birmingham, MD    dextrose  50 % in water (D50W) 50 % solution 12.5 g, 12.5 g, Intravenous, Q15 Min PRN, Trudy Birmingham, MD    glucagon  injection 1 mg, 1 mg, Intramuscular, Once PRN, Trudy Birmingham, MD    glucose chewable tablet 16 g, 16 g, Oral, Q10 Min PRN, Trudy Birmingham, MD    guaiFENesin  (ROBITUSSIN) oral syrup, 200 mg, Oral, Q4H PRN, Trudy Birmingham, MD    heparin  (porcine) 5,000 unit/mL injection 5,000 Units, 5,000 Units, Subcutaneous, Q8H SCH, Trudy Birmingham, MD, 5,000 Units at 01/26/24 2149    hydrOXYzine  (ATARAX ) tablet 25 mg, 25 mg, Oral, Q6H PRN, Trudy Birmingham, MD, 25 mg at 01/27/24 0255    insulin  glargine (LANTUS ) injection BASAL 15 Units, 15 Units, Subcutaneous, Nightly, Trudy Birmingham, MD, 15 Units at 01/26/24 2205    insulin  lispro (HumaLOG ) injection CORRECTIONAL 0-20 Units, 0-20 Units, Subcutaneous, ACHS, Trudy Birmingham, MD, 5 Units at 01/26/24 2228    lactulose  (CEPHULAC ) packet 20 g, 20 g, Oral, TID, Trudy Birmingham, MD, 20 g at 01/26/24 2148    melatonin tablet 3 mg, 3 mg, Oral, Nightly PRN, Trudy Birmingham, MD    multivitamins, therapeutic with minerals tablet 1 tablet, 1 tablet, Oral, Daily, Trudy Birmingham, MD, 1 tablet at 01/26/24 2148    mycophenolate  (CELLCEPT ) capsule 250 mg, 250 mg, Oral, BID, Trudy Birmingham, MD, 250 mg at 01/26/24 2148    oxyCODONE  (ROXICODONE ) 5 mg/5 mL solution 10 mg, 10 mg, Oral, Q4H PRN, Trudy Birmingham, MD    piperacillin -tazobactam (ZOSYN ) IVPB (premix) 2.25 g, 2.25 g, Intravenous, Q8H, Trudy Birmingham, MD, Last Rate: 100 mL/hr at 01/27/24 0502, 2.25 g at 01/27/24 0502    rifAXIMin  (XIFAXAN ) tablet 550 mg, 550 mg, Oral, BID, Trudy Birmingham, MD, 550 mg at 01/26/24 2149  senna (SENOKOT) tablet 2 tablet, 2 tablet, Oral, Nightly PRN, Trudy Birmingham, MD    sevelamer  (RENVELA ) tablet 1,600 mg, 1,600 mg, Oral, 3xd Meals, Trudy Birmingham, MD, 1,600 mg at 01/26/24 2148    tacrolimus  (PROGRAF ) capsule 1 mg, 1 mg, Oral, BID, Trudy Birmingham, MD, 1 mg at 01/26/24 2148    ursodiol  (ACTIGALL ) capsule 600 mg, 600 mg, Oral, BID, Trudy Birmingham, MD, 600 mg at 01/26/24 2149    Current Outpatient Medications:     acetaminophen  (TYLENOL ) 325 MG tablet, Take 2 tablets (650 mg total) by mouth every eight (8) hours., Disp: , Rfl:     amlodipine  (NORVASC ) 10 MG tablet, Take 1 tablet (10 mg total) by mouth daily., Disp: 90 tablet, Rfl: 3    [Paused] apixaban  (ELIQUIS ) 5 mg Tab, Take 1 tablet (5 mg total) by mouth two (2) times a day., Disp: 180 tablet, Rfl: 3    atorvastatin  (LIPITOR ) 40 MG tablet, Take 1 tablet (40 mg total) by mouth daily. (Patient not taking: Reported on 01/24/2024), Disp: 100 tablet, Rfl: 3    blood-glucose meter,continuous (DEXCOM G6 RECEIVER) Misc, 1 each by Miscellaneous route in the morning. Dispense 1 receiver annually.  Sent to [facility]., Disp: 1 each, Rfl: 1    blood-glucose sensor (DEXCOM G7 SENSOR) Devi, Use 1 sensor every 10 days., Disp: 3 each, Rfl: 11    blood-glucose transmitter (DEXCOM G6 TRANSMITTER) Devi, 1 each by Miscellaneous route Every three (3) months. [facility] pharmacy, Disp: , Rfl:     blood-glucose transmitter (DEXCOM G6 TRANSMITTER) Devi, Use as directed to monitor blood glucose, Disp: 1 each, Rfl: 3    carvedilol  (COREG ) 6.25 MG tablet, Take 1 tablet (6.25 mg total) by mouth two (2) times a day. Patient unsure about dosage (Patient not taking: Reported on 01/24/2024), Disp: , Rfl:     CHOLECALCIFEROL, VITAMIN D3, (VITAMIN D3 ORAL), Take 2,000 Units by mouth in the morning., Disp: , Rfl:     famotidine  (PEPCID ) 10 MG tablet, Take 1 tablet (10 mg total) by mouth daily. (Patient not taking: Reported on 01/24/2024), Disp: 90 tablet, Rfl: 0    gabapentin  (NEURONTIN ) 300 MG capsule, Take 1 capsule (300 mg total) by mouth nightly., Disp: 30 capsule, Rfl: 12    insulin  aspart (NOVOLOG  FLEXPEN U-100 INSULIN ) 100 unit/mL (3 mL) injection pen, Inject 0.35 mL (35 Units total) under the skin Three (3) times a day before meals., Disp: 60 mL, Rfl: 0    insulin  degludec (TRESIBA  FLEXTOUCH U-200) 200 unit/mL (3 mL) InPn, Inject 0.3 mL (60 Units total) under the skin daily. Max dose of 100 units per day., Disp: 27 mL, Rfl: 12    lactulose  (CEPHULAC ) 20 gram packet, Take 1 packet (20 g total) by mouth Three (3) times a day. Target 3-5 bowel movements/day, Disp: 270 packet, Rfl: 3    lactulose  10 gram/15 mL solution, Take 30 mL (20 g total) by mouth Three (3) times a day., Disp: 8100 mL, Rfl: 0    lanthanum  (FOSRENOL ) 1000 MG chewable tablet, Chew 1 tablet (1,000 mg total) Three (3) times a day with a meal., Disp: 90 tablet, Rfl: 11    multivitamin (TAB-A-VITE/THERAGRAN) per tablet, Take 1 tablet by mouth in the morning., Disp: , Rfl:     mycophenolate  (CELLCEPT ) 250 mg capsule, Take 1 capsule (250 mg total) by mouth two (2) times a day., Disp: 60 capsule, Rfl: 11    pantoprazole  (PROTONIX ) 40 MG tablet, Take 1 tablet (40 mg total) by mouth daily  before breakfast. (Patient not taking: Reported on 01/24/2024), Disp: 90 tablet, Rfl: 0    pen needle, diabetic (TRUEPLUS PEN NEEDLE) 32 gauge x 5/32 (4 mm) Ndle, Use with insulin  up to 4 times a day as needed., Disp: 100 each, Rfl: 0    pen needle, diabetic 32 gauge x 5/32 (4 mm) Ndle, Use with insulin  up to 4 times/day as needed., Disp: 100 each, Rfl: 0    rifAXIMin  (XIFAXAN ) 550 mg Tab, Take 1 tablet (550 mg total) by mouth two (2) times a day., Disp: 180 tablet, Rfl: 3    sevelamer  (RENVELA ) 800 mg tablet, Take 2 tablets (1,600 mg total) by mouth Three (3) times a day with a meal., Disp: , Rfl:     tacrolimus  (PROGRAF ) 0.5 MG capsule, Take 2 capsules (1 mg total) by mouth two (2) times a day., Disp: 360 capsule, Rfl: 3    [Paused] tirzepatide  (MOUNJARO ) 12.5 mg/0.5 mL PnIj, Inject 12.5 mg under the skin every seven (7) days., Disp: 2 mL, Rfl: 0    [Paused] tirzepatide  (MOUNJARO ) 15 mg/0.5 mL PnIj, Inject 15 mg under the skin every seven (7) days., Disp: 2 mL, Rfl: 3    tirzepatide  (MOUNJARO ) 7.5 mg/0.5 mL PnIj, Inject 7.5 mg under the skin every seven (7) days., Disp: , Rfl:     ursodiol  (ACTIGALL ) 300 mg capsule, Take 2 capsules (600 mg total) by mouth two (2) times a day., Disp: 120 capsule, Rfl: 2    ALLERGIES:   Patient has no known allergies.    SOCIAL HISTORY:   Social History     Tobacco Use    Smoking status: Never     Passive exposure: Past    Smokeless tobacco: Never   Substance Use Topics    Alcohol use: No       FAMILY HISTORY:  Family History[3]       Review of Systems     A review of systems was performed and relevant portions were as noted above in HPI     Physical Exam     VITAL SIGNS:    BP 124/71  - Pulse 55  - Temp 36.3 ??C (97.4 ??F)  - Resp 16  - Wt (!) 123.2 kg (271 lb 9.7 oz) Comment: after dialysis - SpO2 95%  - BMI 38.97 kg/m??           Constitutional:   Alert and oriented.   Head:   Normocephalic and atraumatic  Eyes:   Conjunctivae are normal, EOMI, PERRL  ENT:   No notable congestion, Mucous membranes moist, External ears normal, no notable stridor  Cardiovascular:   Rate as vitals above. Appears warm and well perfused  Respiratory:   Normal respiratory effort. Breath sounds are normal.  Gastrointestinal:   Soft, non-distended, and nontender.   Genitourinary:   Deferred  Musculoskeletal:    Normal range of motion in all extremities. No tenderness or edema noted in B/L lower extremities  Neurologic:   No gross focal neurologic deficits beyond baseline are appreciated.  Skin:   Skin is warm, dry and intact.       Radiology     XR Chest 2 views   Final Result      Cardiomegaly, mild pulmonary edema and probable small bilateral pleural effusions.               PVL Venous Duplex Upper Extremity Left    (Results Pending)       Labs  Labs Reviewed   COMPREHENSIVE METABOLIC PANEL - Abnormal; Notable for the following components:       Result Value    Anion Gap 15 (*)     Creatinine 7.43 (*)     eGFR CKD-EPI (2021) Male 8 (*)     Glucose 281 (*)     Calcium  8.4 (*)     ALT <7 (*)     Alkaline Phosphatase 312 (*)     All other components within normal limits   AMMONIA - Abnormal; Notable for the following components:    Ammonia 59 (*)     All other components within normal limits   PROTIME-INR - Abnormal; Notable for the following components:    PT 12.8 (*)     All other components within normal limits   PRO-BNP - Abnormal; Notable for the following components:    PRO-BNP 17,137.0 (*)     All other components within normal limits    Narrative:     Effective 03/05/23, NT-proBNP replaces BNP, results are not interchangeable. Values less than 300 pg/mL strongly rule out the presence of acute heart failure.   HIGH SENSITIVITY TROPONIN I - SINGLE - Abnormal; Notable for the following components:    hsTroponin I 56 (*)     All other components within normal limits   HIGH SENSITIVITY TROPONIN I - 2H/6H SERIAL - Abnormal; Notable for the following components:    hsTroponin I 55 (*)     All other components within normal limits   POCT GLUCOSE, INTERFACED - Abnormal; Notable for the following components:    Glucose, POC 284 (*)     All other components within normal limits   POCT GLUCOSE, INTERFACED - Abnormal; Notable for the following components:    Glucose, POC 234 (*)     All other components within normal limits   CBC W/ AUTO DIFF - Abnormal; Notable for the following components:    RBC 4.20 (*)     HGB 12.4 (*)     HCT 36.6 (*)     Absolute Lymphocytes 0.5 (*)     All other components within normal limits   RESPIRATORY PATHOGEN PANEL - Normal    Narrative:     This result was obtained using the FDA-cleared [employer] Respiratory 2.1 Panel. Performance characteristics have been established and verified by the Clinical Molecular Microbiology Laboratory, [facility]. This assay does not distinguish between rhinovirus and enterovirus. Lower respiratory specimens will not be tested for Bordetella pertussis/parapertussis. For nasopharyngeal swabs, cross-reactivity may occur between B. pertussis and non-pertussis Bordetella species. All positive B. pertussis results will be automatically confirmed using our in-house PCR assay.   LIPASE - Normal   MAGNESIUM  - Normal   TSH - Normal   HIGH SENSITIVITY TROPONIN I - 4 HOUR SERIAL - Normal   BLOOD CULTURE   BLOOD CULTURE   URINE CULTURE   CBC W/ DIFFERENTIAL    Narrative:     The following orders were created for panel order CBC w/ Differential.                  Procedure                               Abnormality         Status                                     ---------                               -----------         ------  CBC w/ Differential[[id number]]         Abnormal            Final result                                                 Please view results for these tests on the individual orders.   URINALYSIS WITH MICROSCOPY   CBC   HEPATIC FUNCTION PANEL   PROTIME-INR   TACROLIMUS  LEVEL, TIMED   PHOSPHORUS   BASIC METABOLIC PANEL   POCT GLUCOSE-RN OBTAIN   POCT GLUCOSE-RN OBTAIN   POCT GLUCOSE-RN OBTAIN   POCT GLUCOSE-RN OBTAIN   POCT GLUCOSE-RN OBTAIN         Pertinent labs & imaging results that were available during my care of the patient were reviewed by me and considered in my medical decision making (see chart for details).    Please note- This chart has been created using [employer]. Chart creation errors have been sought, but may not always be located and such creation errors, especially pronoun confusion, do NOT reflect on the standard of medical care.       Nyle Devere CROME, MD  Resident  01/27/24 [id number]         [1]   Past Medical History:  Diagnosis Date    CHF (congestive heart failure)        COVID-19 07/18/2019    Diabetes mellitus        Family history of malignant neoplasm of prostate 06/23/2015    Heart disease     Heart murmur     Hepatic cirrhosis    06/23/2015    Overview:  Secondary to NASH; followed by Dr. Lindaann, GI.  Last Assessment & Plan:  Relevant Hx: Course: Daily Update: Today's Plan:     Hypertension     Hypogonadism in male 02/12/2014    Kidney stone     Liver cirrhosis secondary to NASH (nonalcoholic steatohepatitis)        s/p liver transplant 2013    Obesity    [2]   Past Surgical History:  Procedure Laterality Date    CHOLECYSTECTOMY      liver transpant      LIVER TRANSPLANTATION  06/27/2011    PR AV FIST REVISE GRFT,W THROMBECTOMY Right 01/15/2023    Procedure: REVISION, ARTERIOVENOUS FISTULA W/ THROMBECTOMY, AUTOGENOUS OR NONAUTOGENOUS DIALYSIS GRAFT (SEP. PROC), LOWER EXTREMITY;  Surgeon: Marchelle Kitchens, MD;  Location: [facility] OR [facility];  Service: General Surgery    PR COLONOSCOPY FLX DX W/COLLJ SPEC WHEN PFRMD N/A 01/24/2024    Procedure: COLONOSCOPY, FLEXIBLE, PROXIMAL TO SPLENIC FLEXURE; DIAGNOSTIC, W/WO COLLECTION SPECIMEN BY BRUSH OR WASH;  Surgeon: Erick Prentice HERO, MD;  Location: GI [facility]URES [facility];  Service: Gastroenterology    PR COLONOSCOPY W/BIOPSY SINGLE/MULTIPLE N/A 08/13/2018 Procedure: COLONOSCOPY, FLEXIBLE, PROXIMAL TO SPLENIC FLEXURE; WITH BIOPSY, SINGLE OR MULTIPLE;  Surgeon: Thedora Alm Plain, MD;  Location: GI [facility];  Service: Gastroenterology    PR COLSC FLX W/RMVL OF TUMOR POLYP LESION SNARE TQ N/A 08/13/2018    Procedure: COLONOSCOPY FLEX; W/REMOV TUMOR/LES BY SNARE;  Surgeon: Thedora Alm Plain, MD;  [facility]: [facility];  Service: Gastroenterology    PR CREAT AV FISTULA,NON-AUTOGENOUS GRAFT Right 11/29/2022    Procedure: CREATE AV FISTULA (SEPARATE PROC); NONAUTOGENOUS GRAFT (EG, BIOLOGICAL COLLAGEN, THERMOPLASTIC GRAFT), UPPER  EXTREMITY;  Surgeon: Marchelle Kitchens, MD;  Location: [facility] OR [facility];  Service: General Surgery    PR UPPER GI ENDOSCOPY,BIOPSY N/A 07/13/2015    Procedure: UGI ENDOSCOPY; WITH BIOPSY, SINGLE OR MULTIPLE;  Surgeon: Elspeth Jerilynn Reek, MD;  Location: [facility];  Service: Gastroenterology    PR UPPER GI ENDOSCOPY,BIOPSY N/A 02/13/2023    Procedure: UGI ENDOSCOPY; WITH BIOPSY, SINGLE OR MULTIPLE;  Surgeon: Minnie Krystal Claude, MD;  Location: GI [facility]URES [facility];  Service: Gastroenterology    PR UPPER GI ENDOSCOPY,DIAGNOSIS N/A 01/24/2024    Procedure: UGI ENDO, INCLUDE ESOPHAGUS, STOMACH, & DUODENUM &/OR JEJUNUM; DX W/WO COLLECTION SPECIMN, BY BRUSH OR WASH;  Surgeon: Erick Prentice HERO, MD;  Location: GI [facility]URES [facility];  Service: Gastroenterology   [3]   Family History  Problem Relation Age of Onset    Diabetes Father     Diabetes Paternal Grandfather     Liver disease Maternal Uncle     Cancer Maternal Grandmother     Melanoma Neg Hx     Basal cell carcinoma Neg Hx     Squamous cell carcinoma Neg Hx     Kidney disease Neg Hx         Nyle Devere CROME, MD  Resident  01/27/24 [phone]

## 2024-01-27 LAB — BASIC METABOLIC PANEL
ANION GAP: 12 mmol/L (ref 5–14)
BLOOD UREA NITROGEN: 17 mg/dL (ref 9–23)
BUN / CREAT RATIO: 2
CALCIUM: 7.7 mg/dL — ABNORMAL LOW (ref 8.7–10.4)
CHLORIDE: 101 mmol/L (ref 98–107)
CO2: 24 mmol/L (ref 20.0–31.0)
CREATININE: 8.91 mg/dL — ABNORMAL HIGH (ref 0.73–1.18)
EGFR CKD-EPI (2021) MALE: 6 mL/min/1.73m2 — ABNORMAL LOW (ref >=60)
GLUCOSE RANDOM: 202 mg/dL — ABNORMAL HIGH (ref 70–179)
POTASSIUM: 3.6 mmol/L (ref 3.4–4.8)
SODIUM: 137 mmol/L (ref 135–145)

## 2024-01-27 LAB — CBC
HEMATOCRIT: 35.3 % — ABNORMAL LOW (ref 39.0–48.0)
HEMOGLOBIN: 11.6 g/dL — ABNORMAL LOW (ref 12.9–16.5)
MEAN CORPUSCULAR HEMOGLOBIN CONC: 32.9 g/dL (ref 32.0–36.0)
MEAN CORPUSCULAR HEMOGLOBIN: 28.5 pg (ref 25.9–32.4)
MEAN CORPUSCULAR VOLUME: 86.8 fL (ref 77.6–95.7)
MEAN PLATELET VOLUME: 8.5 fL (ref 6.8–10.7)
PLATELET COUNT: 189 10*9/L (ref 150–450)
RED BLOOD CELL COUNT: 4.07 10*12/L — ABNORMAL LOW (ref 4.26–5.60)
RED CELL DISTRIBUTION WIDTH: 13.6 % (ref 12.2–15.2)
WBC ADJUSTED: 4.8 10*9/L (ref 3.6–11.2)

## 2024-01-27 LAB — HEPATIC FUNCTION PANEL
ALBUMIN: 2.9 g/dL — ABNORMAL LOW (ref 3.4–5.0)
ALKALINE PHOSPHATASE: 252 U/L — ABNORMAL HIGH (ref 46–116)
ALT (SGPT): <7 U/L — ABNORMAL LOW (ref 10–49)
AST (SGOT): 12 U/L (ref <=34)
BILIRUBIN DIRECT: 0.3 mg/dL (ref 0.00–0.30)
BILIRUBIN TOTAL: 0.7 mg/dL (ref 0.3–1.2)
PROTEIN TOTAL: 6.4 g/dL (ref 5.7–8.2)

## 2024-01-27 LAB — TACROLIMUS LEVEL, TIMED: TACROLIMUS BLOOD: 7.8 ng/mL

## 2024-01-27 LAB — PHOSPHORUS: PHOSPHORUS: 4.4 mg/dL (ref 2.4–5.1)

## 2024-01-27 LAB — PROTIME-INR
INR: 1.11
PROTIME: 12.7 s — ABNORMAL HIGH (ref 9.9–12.6)

## 2024-01-27 MED ADMIN — heparin (porcine) 5,000 unit/mL injection 5,000 Units: 5000 [IU] | SUBCUTANEOUS | @ 02:00:00

## 2024-01-27 MED ADMIN — piperacillin-tazobactam (ZOSYN) IVPB (premix) 2.25 g: 2.25 g | INTRAVENOUS | @ 09:00:00 | Stop: 2024-01-27

## 2024-01-27 MED ADMIN — ursodiol (ACTIGALL) capsule 600 mg: 600 mg | ORAL | @ 02:00:00

## 2024-01-27 MED ADMIN — piperacillin-tazobactam (ZOSYN) IVPB (premix) 2.25 g: 2.25 g | INTRAVENOUS | @ 02:00:00 | Stop: 2024-02-02

## 2024-01-27 MED ADMIN — mycophenolate (CELLCEPT) capsule 250 mg: 250 mg | ORAL | @ 02:00:00

## 2024-01-27 MED ADMIN — rifAXIMin (XIFAXAN) tablet 550 mg: 550 mg | ORAL | @ 02:00:00 | Stop: 2024-02-09

## 2024-01-27 MED ADMIN — insulin glargine (LANTUS) injection BASAL 15 Units: 15 [IU] | SUBCUTANEOUS | @ 02:00:00

## 2024-01-27 MED ADMIN — insulin lispro (HumaLOG) injection CORRECTIONAL 0-20 Units: 0-20 [IU] | SUBCUTANEOUS | @ 21:00:00

## 2024-01-27 MED ADMIN — lactulose (CEPHULAC) packet 20 g: 20 g | ORAL | @ 18:00:00

## 2024-01-27 MED ADMIN — sevelamer (RENVELA) tablet 1,600 mg: 1600 mg | ORAL | @ 13:00:00

## 2024-01-27 MED ADMIN — sevelamer (RENVELA) tablet 1,600 mg: 1600 mg | ORAL | @ 18:00:00

## 2024-01-27 MED ADMIN — heparin (porcine) 5,000 unit/mL injection 5,000 Units: 5000 [IU] | SUBCUTANEOUS | @ 11:00:00

## 2024-01-27 MED ADMIN — tacrolimus (PROGRAF) capsule 1 mg: 1 mg | ORAL | @ 13:00:00

## 2024-01-27 MED ADMIN — lactulose (CEPHULAC) packet 20 g: 20 g | ORAL | @ 02:00:00

## 2024-01-27 MED ADMIN — ursodiol (ACTIGALL) capsule 600 mg: 600 mg | ORAL | @ 13:00:00

## 2024-01-27 MED ADMIN — lactulose (CEPHULAC) packet 20 g: 20 g | ORAL | @ 13:00:00

## 2024-01-27 MED ADMIN — vancomycin (VANCOCIN) 1250 mg in dextrose 5 % 250 mL IVPB (premix): 1250 mg | INTRAVENOUS | @ 02:00:00 | Stop: 2024-01-26

## 2024-01-27 MED ADMIN — insulin lispro (HumaLOG) injection CORRECTIONAL 0-20 Units: 0-20 [IU] | SUBCUTANEOUS | @ 02:00:00

## 2024-01-27 MED ADMIN — tacrolimus (PROGRAF) capsule 1 mg: 1 mg | ORAL | @ 02:00:00

## 2024-01-27 MED ADMIN — rifAXIMin (XIFAXAN) tablet 550 mg: 550 mg | ORAL | @ 13:00:00 | Stop: 2024-02-09

## 2024-01-27 MED ADMIN — mycophenolate (CELLCEPT) capsule 250 mg: 250 mg | ORAL | @ 13:00:00

## 2024-01-27 MED ADMIN — multivitamins, therapeutic with minerals tablet 1 tablet: 1 | ORAL | @ 02:00:00

## 2024-01-27 MED ADMIN — sevelamer (RENVELA) tablet 1,600 mg: 1600 mg | ORAL | @ 02:00:00

## 2024-01-27 MED ADMIN — heparin (porcine) 5,000 unit/mL injection 5,000 Units: 5000 [IU] | SUBCUTANEOUS | @ 18:00:00

## 2024-01-27 MED ADMIN — amlodipine (NORVASC) tablet 10 mg: 10 mg | ORAL | @ 13:00:00

## 2024-01-27 MED ADMIN — insulin lispro (HumaLOG) injection CORRECTIONAL 0-20 Units: 0-20 [IU] | SUBCUTANEOUS | @ 13:00:00

## 2024-01-27 MED ADMIN — prochlorperazine (COMPAZINE) injection 2.5 mg: 2.5 mg | INTRAVENOUS | @ 18:00:00

## 2024-01-27 MED ADMIN — hydrOXYzine (ATARAX) tablet 25 mg: 25 mg | ORAL | @ 07:00:00

## 2024-01-27 MED ADMIN — multivitamins, therapeutic with minerals tablet 1 tablet: 1 | ORAL | @ 13:00:00

## 2024-01-27 NOTE — Unmapped (Signed)
 Tacrolimus  Therapeutic Monitoring Pharmacy Note    Richard [name] is a 59 y.o. male continuing tacrolimus .     Indication: Liver transplant     Date of Transplant: 02/2012      Prior Dosing Information: Home regimen tacrolimus  1 mg BID      Source(s) of information used to determine prior to admission dosing: Home Medication List, Fill HIstory, or MAR    Goals:  Therapeutic Drug Levels  Tacrolimus  trough goal: 2-4 ng/mL    Additional Clinical Monitoring/Outcomes  Monitor renal function (SCr and urine output) and liver function (LFTs)  Monitor for signs/symptoms of adverse events (e.g., hyperglycemia, hyperkalemia, hypomagnesemia, hypertension, headache, tremor)    Previous Lab Values  Tacrolimus , Trough   Date/Time Value Ref Range Status   01/21/2024 04:49 AM 6.9 5.0 - 15.0 ng/mL Final   01/20/2024 05:22 AM 6.1 5.0 - 15.0 ng/mL Final   01/18/2024 03:51 AM 7.0 5.0 - 15.0 ng/mL Final   01/16/2024 06:02 AM 5.2 5.0 - 15.0 ng/mL Final   12/04/2022 06:32 AM 5.2 5.0 - 15.0 ng/mL Final   10/06/2014 09:00 AM 6.8 <=20.0 ng/mL Final   08/31/2014 08:34 AM 4.2  Final   08/03/2014 08:22 AM 4.7  Final   07/07/2014 08:37 AM 4.2  Final   06/09/2014 08:27 AM 3.5  Final     Tacrolimus , Timed   Date/Time Value Ref Range Status   01/27/2024 06:51 AM 7.8 ng/mL Final   01/19/2024 08:24 AM 5.3 ng/mL Final   01/11/2024 09:43 AM 8.9 ng/mL Final   12/24/2023 10:23 AM 18.1 ng/mL Final   12/04/2023 12:54 PM 3.6 ng/mL Final       Result:  Tacrolimus  level from 8/3 was drawn 3 hours early     Pharmacokinetic Considerations and Significant Drug Interactions:  Concurrent CYP3A4 substrates/inhibitors: None identified    Assessment/Plan:  Recommendedation(s)  Give tacrolimus  0.5 mg once tonight, and recheck level tomorrow morning to guide further dosing  Tacrolimus  1 mg BID - Held    Follow-up  Next level should be ordered on 01/28/24 at 0600.   A pharmacist will continue to monitor and recommend levels as appropriate    Please page service pharmacist with questions/clarifications.    Richard [name], PharmD

## 2024-01-27 NOTE — Unmapped (Signed)
 Internal Medicine (MEDW) Progress Note    Assessment & Plan:   Richard [name] is a 57 y.o. 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     Principal Problem:    Chills  Active Problems:    Type 2 diabetes mellitus with hyperglycemia, with long-term current use of insulin        Liver replaced by transplant       Class 3 obesity    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)    ESRD (end stage renal disease) on dialysis       Type 2 MI (myocardial infarction)       (HFpEF) heart failure with preserved ejection fraction       Elevated liver enzymes    Hyperphosphatemia    Left arm swelling    Rigors        Active Problems    #Myoclonic Jerking - C/f Infection   On admission concern for chills and rigoring prompting infectious workup, however on further questioning patient description is more consistent with myoclonic jerking which could be secondary to medications.  Potential contributing medications include tacrolimus  which is supratherapeutic, Keflex  and oxycodone .  He is quite well-appearing on AM evaluation and denies any localizing symptoms.  Will follow infectious workup however stop antibiotics for now.   - follow infectious workup (bcx, ucx)   - no pocket for diagnostic paracentesis   - s/p Vanc/Zosyn   - low threshold to resume abx if signs of worsening infection   - tacrolimus  dosing per pharmacy  - limit opioid medications      #Hx of Liver Transplant (2013) - Concern for graft dysfunction   Patient with hx of liver transplant in 2013. Admitted last week with HE raising concern for cirrhosis of the transplanted liver and initiated on xifaxan  along with existing lactulose . Biopsy results not consistent with cirrhosis and only remarkable for mild macrovesicular steatosis (5%). Patient recommended to undergo MRI abdomen with elastography for further evaluation as a result. Small nonbleeding varices in esophagus on EGD 7/31. Additionally initiated on ursodiol  given positive AMA and elevated ALP however no findings on biopsy to suggest PBC. Will plan to consult hepatology for assistance in management and consideration of completing MRI while here. In addition, will continue home immunosuppression regimen and encephalopathy ppx.  - Cont. Home Tacrolimus  1mg  BID, dosing per pharmacy   - Cont. Home Mycophenolate  250mg  BID  - Cont. Home Ursodiol  600mg  BID  - Cont. Home Lactulose  20g TID (Titrate to 3-5 Bms daily)  - Cont. Home xifaxan  550mg  BID  - Hepatology cs  - MRI elastography outpatient   - daily MELD score      #ESRD on HD TTS  Patient recently completed dialysis session this AM. Do not feel that uremia is playing a role in his presentation as above. Will consult nephrology for continuation of dialysis while inpatient.  - Nephrology cs if remains admitted prior to HD   - Cont. Home Renvela  1600mg  TID     #Insulin -Dependent T2DM  Patient taking regimen of mounjaro , 60u of tresiba , and 35u of novolog  with meals at home. Will initiate sliding scale in basal glargine at 25% dosing to minimize risk of hypoglycemia. Can titrate further based off of insulin  needs  - 15U Glargine nightly  -  SSI  - Titrate insulin  as needed  - HOLD Glp1     #Type II MI  Patient with troponin peak of 56 in the ED.  Now downtrending.  Denies chest pain.  No need for further monitoring.      #HFpEF (EF >55%)  Patient with elevated pro-BNP to 17,137 on admission. Mild pulmonary vascular congestion seen on CXR however proBNP appears to be chronically elevated given previous ProBNP of ~14000 at last admission. Last echo w EF >55%. Does not endorse shortness of breath at this time. Will continue to monitor  - CTM     Chronic Problems  #HTN: Cont. Home amlodipine   #Chronic Anemia: Likely in setting of renal disease. Cont to monitor with daily cbc.              Issues Impacting Complexity of Management:              Daily Checklist:  Diet: Regular Diet  DVT PPx: Heparin  5000units q8h  Electrolytes: No Repletion Needed  Code Status: Full Code  Dispo: home pending clinical improvement    Team Contact Information:   Primary Team: Internal Medicine (MEDW)  Primary Resident: [name], MD, MD  Resident's Pager: [phone] ([facility] - [facility])    Interval History:     Admitted overnight, patient states he is feeling well this morning.  Denies feeling confused.  He states he is having jerking sensation the day prior to dialysis and did go to his dialysis session on 8/2 without issues. He denies any fevers or chills.  Minimal appetite today though no nausea vomiting. Denies pain or swelling in his upper extremities.     Objective:   Temp:  [36.3 ??C (97.3 ??F)] 36.3 ??C (97.3 ??F)  Pulse:  [55-66] 66  SpO2 Pulse:  [55-69] 64  Resp:  [9-29] 9  BP: (107-149)/(63-99) 149/86  SpO2:  [95 %-100 %] 97 %,   Intake/Output Summary (Last 24 hours) at 01/27/2024 1923  Last data filed at 01/27/2024 0532  Gross per 24 hour   Intake 633.33 ml   Output 0 ml   Net 633.33 ml       Gen: NAD, converses   HENT: atraumatic, normocephalic  Heart: RRR  Lungs: CTAB, no crackles or wheezes  Abdomen: obese, soft, NTND  Extremities: No edema; no erythema or warmth on upper extremities

## 2024-01-27 NOTE — Unmapped (Signed)
 Hepatology Consult Service   Initial Consultation         Assessment and Recommendations:   Richard [name] is a 58 y.o. male with a PMHx of HTN, T2DM, HFpEF, [name] cirrhosis s/p OLT 2013 now c/f cirrhosis of transplanted liver, ESRD 2/2 CNI toxicity on HD (TTS) who presented to [facility] with rigors and chills. The patient is seen in consultation at the request of Powell Sydelle Caddy, MD ([facility] ([facility])) for history of liver transplant.    [name] cirrhosis s/p liver transplantation 2013  Concern for graft dysfunction   Has had recurrent admissions for hepatic encephalopathy.  There is ongoing concern of cirrhosis of his graft despite biopsy not being consistent with this.  Currently admitted with several days of rigors and confusion in the setting of running out of lactulose  with asterixis on exam.  Has been afebrile with LFTs and INR at baseline.  Suspect lactulose  deficiency. He denies any signs of bleeding or localizing infectious symptoms but recommend thorough infectious workup given recent EGD and colonoscopy  - Follow-up blood cultures  - Please obtain diagnostic paracentesis  - Continue lactulose , rifaximin .  Will need to discuss access to these medications prior to discharge  - Continue tacrolimus , CellCept , ursodiol   - Trend CBC, CMP, INR at least daily  - Will need MRI elastography as an outpatient    MELD 3.0: 19 at 01/27/2024  6:51 AM  MELD-Na: 21 at 01/27/2024  6:51 AM  Calculated from:  Serum Creatinine: On dialysis. Using the maximum value.  Serum Sodium: 137 mmol/L at 01/27/2024  6:51 AM  Total Bilirubin: 0.7 mg/dL (Using min of 1 mg/dL) at 07/01/7972  3:48 AM  Serum Albumin: 2.9 g/dL at 07/01/7972  3:48 AM  INR(ratio): 1.11 at 01/27/2024  6:51 AM  Age at listing (hypothetical): 58 years  Sex: Male at 01/27/2024  6:51 AM    Issues Impacting Complexity of Management:  -The patient has the need for intensive monitoring parameter(s) due to high-risk of clinical decline: frequent monitoring of MELD score to monitor for progression to/progression of acute or acute on chronic liver failure    Recommendations discussed with the patient's primary team. We will continue to follow along with you.    Subjective:   He was recently admitted and discharged on 7/28 for AV fistula clot and thrombophlebitis as well as hepatic encephalopathy likely secondary to change from rifaximin  to lactulose  and opiate use for pain following liver biopsy.  Ursodiol  was started at that time given positive AMA and alk phos although nothing on liver biopsy to suggest PBC.  Was planning on obtaining MRI abdomen with elastography given biopsy was not suggestive of advanced cirrhosis despite clinical suspicion still being high but has not been completed yet.    He underwent EGD and colonoscopy on 7/31.  Colonoscopy was normal, EGD showed small less than 5 mm esophageal varices with no banding required, large amount of food in the stomach.  Did fine postoperatively and was sent home.  He is now coming in with rigors and chills but no fever.  He denies cough, congestion, shortness of breath, nausea, vomiting, diarrhea, melena, hematochezia or other signs of GI bleeding.    WBC 4.8, hemoglobin 11.6, platelets 189, albumin 2.9, total bilirubin 0.7, AST 12, ALT less than 7, alk phos 252, INR 1.1      A: Liver allograft, 11 years and 10 months post orthotopic liver transplant (transplanted on 03/02/2012), core needle biopsy  - Negative for T-cell mediated (  acute cellular) rejection, RAI = 0/9  - No ductopenia identified  - Moderate zone 3 sinusoidal dilatation and mild macrovesicular steatosis (5%)  - Increased hepatocellular iron deposition, grade 1-2    Objective:   Temp:  [36.3 ??C (97.3 ??F)-36.8 ??C (98.2 ??F)] 36.3 ??C (97.3 ??F)  Pulse:  [55-88] 59  SpO2 Pulse:  [64-70] 67  Resp:  [16-29] 29  BP: (107-145)/(63-100) 129/99  SpO2:  [95 %-99 %] 96 %    Gen: Chronically ill-appearing male in NAD, drowsy but answers questions appropriately  Eyes: Sclera anicteric  Abdomen: Normoactive bowel sounds, soft, NTND, no rebound/guarding, no hepatosplenomegaly  Extremities: No clubbing, cyanosis, or edema in the BLEs  Neuro: Normal speech. +Asterixis.  Skin: No rashes, lesions on clothed exam  Psych: Alert, normal mood and affect.     Pertinent Labs & Studies:  -I have reviewed the patient's labs from 01/27/24 which show stable Hgb, stable renal function (SCr, electrolytes), stable LFTs, and stable INR

## 2024-01-27 NOTE — Unmapped (Addendum)
 Richard [name] is a 58 y.o. 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     Hospital Course is as follows:    Active Problems     Hx of Liver Transplant (2013) - Concern for graft dysfunction   Patient with hx of liver transplant in 2013. Admitted last week with HE, but after further evaluation suspect this was due to uremia. Liver biopsy on 7/15 without evidence of fibrosis, thus very unlikely to have portal pressure or shunt abnormalities. Was followed by Hepatology who evaluated pt with MRI elastography on 8/6 due to concern for cirrhosis of transplanted liver which showed small liver, large caliber portosystemic shunt b/w dilated IVC and venous collaterals. 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 He will follow with Hepatology after discharge, continue Tacrolimus  1mg  BID and MMF 250 BID, and lactulose .      DVT left internal jugular vein  Hx DVT in RLE  Found to have acute DVT in left internal jugular vein including possible obstruction and proximal brachiocephalic vein.  Hypercoagulable ISO liver disease.  Patient with history of DVT of popliteal vein and right lower extremity for which patient takes anticoagulation. Had tunneled line replaced with VIR. Continues on Eliquis  5mg  BID.    Insulin -Dependent T2DM  Patient taking regimen of mounjaro , 60u of tresiba , and 35u of novolog  with meals at home. At time of discharge he was on 40u nightly decreased from his home 60, alongside decreased food intake.      Myoclonus  Present for first 2-3 days of admission, improved after stopping Keflex  which can be a side effect. Pt was without further symptoms for last three days of admission prior to discharge.    HFpEF (EF >55%)  Patient with elevated pro-BNP to 17,137 on admission. Mild pulmonary vascular congestion seen on CXR however proBNP appears to be chronically elevated given previous ProBNP of ~14000 at last admission. Last echo w EF >55%. Deferred IV diuresis.

## 2024-01-28 LAB — HEPATIC FUNCTION PANEL
ALBUMIN: 2.7 g/dL — ABNORMAL LOW (ref 3.4–5.0)
ALKALINE PHOSPHATASE: 225 U/L — ABNORMAL HIGH (ref 46–116)
ALT (SGPT): <7 U/L — ABNORMAL LOW (ref 10–49)
AST (SGOT): 14 U/L (ref <=34)
BILIRUBIN DIRECT: 0.3 mg/dL (ref 0.00–0.30)
BILIRUBIN TOTAL: 0.5 mg/dL (ref 0.3–1.2)
PROTEIN TOTAL: 6.1 g/dL (ref 5.7–8.2)

## 2024-01-28 LAB — CBC
HEMATOCRIT: 36.8 % — ABNORMAL LOW (ref 39.0–48.0)
HEMOGLOBIN: 12 g/dL — ABNORMAL LOW (ref 12.9–16.5)
MEAN CORPUSCULAR HEMOGLOBIN CONC: 32.8 g/dL (ref 32.0–36.0)
MEAN CORPUSCULAR HEMOGLOBIN: 29.4 pg (ref 25.9–32.4)
MEAN CORPUSCULAR VOLUME: 89.8 fL (ref 77.6–95.7)
MEAN PLATELET VOLUME: 8.6 fL (ref 6.8–10.7)
PLATELET COUNT: 171 10*9/L (ref 150–450)
RED BLOOD CELL COUNT: 4.09 10*12/L — ABNORMAL LOW (ref 4.26–5.60)
RED CELL DISTRIBUTION WIDTH: 14.1 % (ref 12.2–15.2)
WBC ADJUSTED: 5.6 10*9/L (ref 3.6–11.2)

## 2024-01-28 LAB — BASIC METABOLIC PANEL
ANION GAP: 15 mmol/L — ABNORMAL HIGH (ref 5–14)
BLOOD UREA NITROGEN: 24 mg/dL — ABNORMAL HIGH (ref 9–23)
BUN / CREAT RATIO: 2
CALCIUM: 7.3 mg/dL — ABNORMAL LOW (ref 8.7–10.4)
CHLORIDE: 104 mmol/L (ref 98–107)
CO2: 19 mmol/L — ABNORMAL LOW (ref 20.0–31.0)
CREATININE: 11.09 mg/dL — ABNORMAL HIGH (ref 0.73–1.18)
EGFR CKD-EPI (2021) MALE: 5 mL/min/1.73m2 — ABNORMAL LOW (ref >=60)
GLUCOSE RANDOM: 305 mg/dL — ABNORMAL HIGH (ref 70–179)
POTASSIUM: 3.9 mmol/L (ref 3.4–4.8)
SODIUM: 138 mmol/L (ref 135–145)

## 2024-01-28 LAB — TACROLIMUS LEVEL, TROUGH: TACROLIMUS, TROUGH: 8.1 ng/mL (ref 5.0–15.0)

## 2024-01-28 LAB — PHOSPHORUS: PHOSPHORUS: 4.5 mg/dL (ref 2.4–5.1)

## 2024-01-28 LAB — PROTIME-INR
INR: 1.14
PROTIME: 13 s — ABNORMAL HIGH (ref 9.9–12.6)

## 2024-01-28 LAB — C-REACTIVE PROTEIN: C-REACTIVE PROTEIN: 6.4 mg/L (ref <=10.0)

## 2024-01-28 MED ADMIN — hydrOXYzine (ATARAX) tablet 25 mg: 25 mg | ORAL | @ 19:00:00

## 2024-01-28 MED ADMIN — oxyCODONE (ROXICODONE) immediate release tablet 10 mg: 10 mg | ORAL | @ 16:00:00 | Stop: 2024-01-30

## 2024-01-28 MED ADMIN — insulin lispro (HumaLOG) injection CORRECTIONAL 0-20 Units: 0-20 [IU] | SUBCUTANEOUS | @ 21:00:00

## 2024-01-28 MED ADMIN — insulin lispro (HumaLOG) injection CORRECTIONAL 0-20 Units: 0-20 [IU] | SUBCUTANEOUS | @ 01:00:00

## 2024-01-28 MED ADMIN — amlodipine (NORVASC) tablet 10 mg: 10 mg | ORAL | @ 12:00:00

## 2024-01-28 MED ADMIN — mycophenolate (CELLCEPT) capsule 250 mg: 250 mg | ORAL | @ 12:00:00

## 2024-01-28 MED ADMIN — sevelamer (RENVELA) tablet 1,600 mg: 1600 mg | ORAL | @ 12:00:00

## 2024-01-28 MED ADMIN — sevelamer (RENVELA) tablet 1,600 mg: 1600 mg | ORAL | @ 01:00:00

## 2024-01-28 MED ADMIN — heparin (porcine) 5,000 unit/mL injection 5,000 Units: 5000 [IU] | SUBCUTANEOUS | @ 18:00:00 | Stop: 2024-01-28

## 2024-01-28 MED ADMIN — ursodiol (ACTIGALL) capsule 600 mg: 600 mg | ORAL | @ 03:00:00

## 2024-01-28 MED ADMIN — ursodiol (ACTIGALL) capsule 600 mg: 600 mg | ORAL | @ 12:00:00

## 2024-01-28 MED ADMIN — insulin lispro (HumaLOG) injection CORRECTIONAL 0-20 Units: 0-20 [IU] | SUBCUTANEOUS | @ 16:00:00

## 2024-01-28 MED ADMIN — sevelamer (RENVELA) tablet 1,600 mg: 1600 mg | ORAL | @ 21:00:00

## 2024-01-28 MED ADMIN — multivitamins, therapeutic with minerals tablet 1 tablet: 1 | ORAL | @ 12:00:00

## 2024-01-28 MED ADMIN — insulin lispro (HumaLOG) injection CORRECTIONAL 0-20 Units: 0-20 [IU] | SUBCUTANEOUS | @ 12:00:00

## 2024-01-28 MED ADMIN — insulin glargine (LANTUS) injection BASAL 15 Units: 15 [IU] | SUBCUTANEOUS | @ 01:00:00

## 2024-01-28 MED ADMIN — acetaminophen (TYLENOL) tablet 650 mg: 650 mg | ORAL | @ 03:00:00

## 2024-01-28 MED ADMIN — rifAXIMin (XIFAXAN) tablet 550 mg: 550 mg | ORAL | @ 01:00:00 | Stop: 2024-02-09

## 2024-01-28 MED ADMIN — tacrolimus (PROGRAF) capsule 0.5 mg: .5 mg | ORAL | @ 01:00:00 | Stop: 2024-01-27

## 2024-01-28 MED ADMIN — oxyCODONE (ROXICODONE) immediate release tablet 10 mg: 10 mg | ORAL | @ 21:00:00 | Stop: 2024-01-30

## 2024-01-28 MED ADMIN — rifAXIMin (XIFAXAN) tablet 550 mg: 550 mg | ORAL | @ 12:00:00 | Stop: 2024-02-09

## 2024-01-28 MED ADMIN — heparin (porcine) 5,000 unit/mL injection 5,000 Units: 5000 [IU] | SUBCUTANEOUS | @ 10:00:00 | Stop: 2024-01-28

## 2024-01-28 MED ADMIN — lactulose (CEPHULAC) packet 20 g: 20 g | ORAL | @ 12:00:00

## 2024-01-28 MED ADMIN — oxyCODONE (ROXICODONE) immediate release tablet 10 mg: 10 mg | ORAL | @ 12:00:00 | Stop: 2024-01-30

## 2024-01-28 MED ADMIN — famotidine (PEPCID) tablet 10 mg: 10 mg | ORAL | @ 21:00:00

## 2024-01-28 MED ADMIN — mycophenolate (CELLCEPT) capsule 250 mg: 250 mg | ORAL | @ 01:00:00

## 2024-01-28 MED ADMIN — oxyCODONE (ROXICODONE) 5 mg/5 mL solution 10 mg: 10 mg | ORAL | @ 03:00:00 | Stop: 2024-01-27

## 2024-01-28 MED ADMIN — lactulose (CEPHULAC) packet 20 g: 20 g | ORAL | @ 18:00:00

## 2024-01-28 MED ADMIN — heparin (porcine) 5,000 unit/mL injection 5,000 Units: 5000 [IU] | SUBCUTANEOUS | @ 01:00:00

## 2024-01-28 NOTE — Unmapped (Signed)
 Internal Medicine (MEDW) Progress Note    Assessment & Plan:   Richard [name] is a 58 y.o. 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     Principal Problem:    Chills  Active Problems:    Type 2 diabetes mellitus with hyperglycemia, with long-term current use of insulin        Liver replaced by transplant       Class 3 obesity    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)    ESRD (end stage renal disease) on dialysis       Type 2 MI (myocardial infarction)       (HFpEF) heart failure with preserved ejection fraction       Elevated liver enzymes    Hyperphosphatemia    Left arm swelling    Rigors      Active Problems    Myoclonic Jerking - C/f Infection   On admission concern for chills and rigoring prompting infectious workup, however on further questioning patient description is more consistent with myoclonic jerking which could be secondary to medications.  Potential contributing medications include tacrolimus  which is supratherapeutic, Keflex  and oxycodone .  He is quite well-appearing on AM evaluation and denies any localizing symptoms.  Will follow infectious workup however stop antibiotics for now.  Blood cultures NGTD.  Tacrolimus , although is associated with myoclonic jerking, have pulled troughs too close together thus likely overestimating active amount of tacrolimus .  Would recommend continuing current dose as per GI.  - Blood cultures NGTD, awaiting urine culture  - no pocket for diagnostic paracentesis   - low threshold to resume abx if signs of worsening infection   - tacrolimus  dosing per pharmacy, repeat appropriate timed trough  - limit opioid medications     DVT left internal jugular vein  Hx DVT in RLE  PVL study of left upper extremity demonstrates evidence of acute DVT in left internal jugular vein including possible obstruction and proximal brachiocephalic vein.  Hypercoagulable ISO liver disease.  Patient with history of DVT of popliteal vein and right lower extremity patient/2025 for which patient takes anticoagulation.    - restart home dose Eliquis  5 mg twice daily     Hx of Liver Transplant (2013) - Concern for graft dysfunction   Patient with hx of liver transplant in 2013. Admitted last week with HE raising concern for cirrhosis of the transplanted liver and initiated on xifaxan  along with existing lactulose . Biopsy results not consistent with cirrhosis and only remarkable for mild macrovesicular steatosis (5%). Patient recommended to undergo MRI abdomen with elastography for further evaluation as a result. Small nonbleeding varices in esophagus on EGD 7/31. Additionally initiated on ursodiol  given positive AMA and elevated ALP however no findings on biopsy to suggest PBC. Will plan to consult hepatology for assistance in management and consideration of completing MRI while here. In addition, will continue home immunosuppression regimen and encephalopathy ppx.  - Cont. Home Tacrolimus  1mg  BID, dosing per pharmacy   - Cont. Home Mycophenolate  250mg  BID  - Cont. Home Ursodiol  600mg  BID  - Cont. Home Lactulose  20g TID (Titrate to 3-5 Bms daily)  - Cont. Home xifaxan  550mg  BID  - Hepatology cs  -Repeat tacrolimus  trough timed appropriately  - MRI elastography outpatient   - daily MELD score  ESRD on HD TTS  Patient recently completed dialysis session this AM. Do not feel that uremia is playing a role in his presentation as above. Will consult nephrology for continuation of dialysis while inpatient.  - Nephrology consulted, HD on usual schedule  - Cont. Home Renvela  1600mg  TID     Insulin -Dependent T2DM  Patient taking regimen of mounjaro , 60u of tresiba , and 35u of novolog  with meals at home. Will initiate sliding scale in basal glargine at 25% dosing to minimize risk of hypoglycemia. Can titrate further based off of insulin  needs  - 15U Glargine nightly  - SSI  - Titrate insulin  as needed  - HOLD Glp1     Type II MI  Patient with troponin peak of 56 in the ED.  Now downtrending.  Denies chest pain.  No need for further monitoring.      HFpEF (EF >55%)  Patient with elevated pro-BNP to 17,137 on admission. Mild pulmonary vascular congestion seen on CXR however proBNP appears to be chronically elevated given previous ProBNP of ~14000 at last admission. Last echo w EF >55%. Does not endorse shortness of breath at this time. Will continue to monitor  - CTM     Chronic Problems  HTN: Cont. Home amlodipine   Chronic Anemia: Likely in setting of renal disease. Cont to monitor with daily cbc.    Daily Checklist:  Diet: Regular Diet  DVT PPx: Heparin  5000units q8h  Electrolytes: No Repletion Needed  Code Status: Full Code  Dispo: home pending clinical improvement    Team Contact Information:   Primary Team: Internal Medicine (MEDW)  Primary Resident: Artist ONEIDA Ill, MD, MD  Resident's Pager: [phone] ([facility] Intern - [facility])    Interval History:     Patient reports improved  muscle jerking sensation.  He has mild, intermittent abdominal pain.  He denies any fevers, chills, headache, confusion, nausea, vomiting.  He is concerned that he has a blood clot in his left upper extremity.    Objective:   Temp:  [36.4 ??C (97.5 ??F)-36.7 ??C (98.1 ??F)] 36.4 ??C (97.5 ??F)  Pulse:  [57-69] 57  SpO2 Pulse:  [55-64] 60  Resp:  [9-19] 18  BP: (116-165)/(70-103) 116/70  SpO2:  [96 %-100 %] 100 %, No intake or output data in the 24 hours ending 01/28/24 1350      Gen: NAD, converses   HENT: atraumatic, normocephalic  Heart: RRR  Lungs: CTAB, no crackles or wheezes  Abdomen: obese, soft, NTND  Extremities: No edema; no erythema or warmth on upper extremities     Artist Ill, MD  PGY1 Internal Medicine

## 2024-01-28 NOTE — Unmapped (Signed)
 Care Management  Initial Transition Planning Assessment              Patient lives with girlfriend Richard [name] in [name]) in a 1 level home with 1 steps to enter.  At baseline patient is independent with ADLs. He does not receives home health [name]. DME is none. Richard [name] will provide transportation and will assist with basic care at home.     General  Care Manager / Social Worker assessed the patient by : Medical record review, Discussion with Clinical Care team  Orientation Level: Oriented X4  Reason for referral: Discharge Planning    Contact/[name]  Extended Emergency Contact Information  Primary Emergency Contact: Richard [name]  Address: [address]           [place], [place] [address] [place]  Work Phone: [phone]  Mobile Phone: [phone]  Relation: Sister  Secondary Emergency Contact: Richard [name]  Relation: Spouse  Interpreter needed? Yes    Legal Next of Kin / Guardian / POA / Advance Directives     HCDM (patient stated preference): Richard [name] - Sister - [phone]    Advance Directive (Medical Treatment)  Does patient have an advance directive covering medical treatment?: Patient does not have advance directive covering medical treatment.  Reason patient does not have an advance directive covering medical treatment:: Patient does not wish to complete one at this time.    Health Care Decision Maker [HCDM] (Medical & Mental Health Treatment)  Healthcare Decision Maker: HCDM documented in the HCDM/Contact Info section.  Information offered on HCDM, Medical & Mental Health advance directives:: Patient declined information.         Readmission Information    Have you been hospitalized in the last 30 days?: Yes  Name of [name]: Richard [name]           Number of Days between previous discharge and readmission date: 4-7 days                 Did the following happen with your discharge? Patient Information  Lives with: Spouse/significant other    Type of Residence: Private residence             Support Systems/Concerns: Family Members, Children    Responsibilities/Dependents at home?: No    Home Care [name] in place prior to admission?: No                  Equipment Currently Used at Home: none       Currently receiving outpatient dialysis?: Yes  Facility providing dialysis (Name/Contact Info): [facility] [address] [place] [address]  [phone]    Financial Information       Need for financial assistance?: No       Social Determinants of Health  Social Drivers of Health     Food Insecurity: No Food Insecurity (01/18/2024)    Hunger Vital Sign     Worried About Running Out of Food in the Last Year: Never true     Ran Out of Food in the Last Year: Never true   Tobacco Use: Low Risk  (01/26/2024)    Patient History     Smoking Tobacco Use: Never     Smokeless Tobacco Use: Never     Passive Exposure: Past   Recent Concern: Tobacco Use - Medium Risk (01/15/2024)    Patient History     Smoking Tobacco Use: Former     Smokeless Tobacco Use: Never  Passive Exposure: Past   Transportation Needs: No Transportation Needs (01/18/2024)    PRAPARE - Therapist, art (Medical): No     Lack of Transportation (Non-Medical): No   Alcohol Use: Not At Risk (11/09/2023)    Alcohol Use     How often do you have a drink containing alcohol?: Never     How many drinks containing alcohol do you have on a typical day when you are drinking?: Not on file     How often do you have 5 or more drinks on one occasion?: Never   Housing: Low Risk  (01/18/2024)    Housing     Within the past 12 months, have you ever stayed: outside, in a car, in a tent, in an overnight shelter, or temporarily in someone else's home (i.e. couch-surfing)?: No     Are you worried about losing your housing?: No   Physical Activity: Insufficiently Active (07/29/2020)    Received from [facility] visits prior to 08/26/2022.    Exercise Vital Sign     On average, how many days per week do you engage in moderate to strenuous exercise (like a brisk walk)?: 2 days     On average, how many minutes do you engage in exercise at this level?: 20 min   Utilities: Low Risk  (01/18/2024)    Utilities     Within the past 12 months, have you been unable to get utilities (heat, electricity) when it was really needed?: No   Stress: No Stress Concern Present (07/29/2020)    Received from [name] visits prior to 08/26/2022.    [employer] of Occupational Health - Occupational Stress Questionnaire     Feeling of Stress : Not at all   Interpersonal Safety: Not At Risk (01/26/2024)    Interpersonal Safety     Unsafe Where You Currently Live: No     Physically Hurt by Anyone: No     Abused by Anyone: No   Substance Use: Not on file (05/02/2023)   Intimate Partner Violence: Not At Risk (01/26/2024)    Humiliation, Afraid, Rape, and Kick questionnaire     Fear of Current or Ex-Partner: No     Emotionally Abused: No     Physically Abused: No     Sexually Abused: No   Social Connections: Unknown (07/10/2022)    Received from [facility]    Social Network     Social Network: Not on file   Financial Resource Strain: Low Risk  (01/18/2024)    Overall Financial Resource Strain (CARDIA)     Difficulty of Paying Living Expenses: Not hard at all   Health Literacy: Low Risk  (11/09/2023)    Health Literacy     : Never   Internet Connectivity: Not on file       Complex Discharge Information                                                                    Interventions:       Discharge Needs Assessment  Concerns to be Addressed:      Clinical Risk Factors:  Discharge Facility/Level of Care Needs:      Readmission  Risk of Unplanned Readmission Score: UNPLANNED READMISSION SCORE: 25.61%  Predictive Model Details          26% (High)  Factor Value    Calculated 01/28/2024 08:05 23% Number of active inpatient medication orders 41     Risk of Unplanned Readmission Model 9% Charlson Comorbidity Index 10     9% Number of ED visits in last six months 2     8% ECG/EKG order present in last 6 months     8% Latest calcium  low (7.3 mg/dL)     7% Latest BUN high (24 mg/dL)     5% Imaging order present in last 6 months     5% Latest hemoglobin low (12.0 g/dL)     5% Phosphorous result present     4% Number of hospitalizations in last year 1     4% Age 58     4% Active anticoagulant inpatient medication order present     4% Latest creatinine high (11.09 mg/dL)     3% Diagnosis of renal failure present     2% Future appointment scheduled     1% Current length of stay 1.498 days     1% Active ulcer inpatient medication order present      Readmitted Within the Last 30 Days? (No if blank) Yes       Discharge Plan       Expected Discharge Date:     Expected Transfer from Critical Care:                 Initial Assessment complete?: Yes

## 2024-01-28 NOTE — Unmapped (Signed)
 Nephrology ESKD Consult Note    Requesting provider: Ozell Jenna Mart, MD  Service requesting consult: [facility] (MDW)  Reason for consult: Evaluation for dialysis needs    Outpatient dialysis unit:   [facility]  [address]  [place] [address]     Assessment/Recommendations: Richard [name] is a 58 y.o. male with a past medical history notable for ESKD on in-center hemodialysis being evaluated at [facility] for chills, rigors vs myoclonic jerking.    # ESKD:   Outpatient HD Dialysis Rx and medications    Dialysis schedule: TTS   Time: 4  hrs Temp: 36.5C   Dialyzer: Optiflux 180 NRE Heparin : Receiving heparin  4k unit bolus and mid-treatment 4k unit bolus   Dialysate Bath: 3 K, 2.5Ca, Na 137, Bicarbonate 35   EDW: 120.5 kg      - OP dialysis prescription reviewed and ordered  - Indication for acute dialysis?: No  - We will perform HD starting tomorrow and will otherwise attempt to continue pt's outpatient schedule if possible.   - Access: Left IJ tunneled catheter ; Vascular Access functioning well- no indication for intervention. Planned to have intervention with Dr. Marchelle for AVF repair.  - Hepatitis status: Hepatitis B surface antigen negative on 01/01/24.    # Volume / Hypertension:  - Volume: Will attempt to achieve dry weight if tolerated  - Will adjust daily as needed    # Acid-Base / Electrolytes:  - Will evaluate acid-base status and electrolyte balance daily and adjust dialysate as needed.  Lab Results   Component Value Date    K 3.9 01/28/2024    CO2 19.0 (L) 01/28/2024       # Anemia of Chronic Kidney Disease:  - ESA: Patient not on ESA  Date of last ESA dose  n/a  - IV Iron/ PO oral iron: none  - Outpatient meds reviewed and ordered   Lab Results   Component Value Date    HGB 12.0 (L) 01/28/2024       # Secondary Hyperparathyroidism/Hyperphosphatemia:  - Phosphate binders : Renvela  1600 mg TID  - Activated Vitamin D Analogs: Patient not on any activated Vitamin D analogs , Calcitriol  0.25 mcg q dialysis  - Calcimimetics: sensipar 180 mg q dialysis  - Outpatient meds reviewed and ordered   Lab Results   Component Value Date    PHOS 4.5 01/28/2024    CALCIUM  7.3 (L) 01/28/2024       # Rigor, chills  # Myoclonic jerking   - Evaluation and management per primary team  - No changes to management from a nephrology standpoint at this time    Carron Blanch, DO  01/28/2024 2:20 PM   Medical decision-making for 01/28/24  Findings / Data     Patient has: []  acute illness w/systemic sxs  [mod]  []  two or more stable chronic illnesses [mod]  []  one chronic illness with acute exacerbation [mod]  []  acute complicated illness  [mod]  []  Undiagnosed new problem with uncertain prognosis  [mod] [x]  illness posing risk to life or bodily function (ex. AKI)  [high]  []  chronic illness with severe exacerbation/progression  [high]  []  chronic illness with severe side effects of treatment  [high] ESKD on RRT Probs At least 2:  Probs, Data, Risk   I reviewed: []  primary team note  []  consultant note(s)  [x]  external records [x]  chemistry results  [x]  CBC results  []  blood gas results  []  Other []   procedure/op note(s)   []  radiology report(s)  []  micro result(s)  []  w/ independent historian(s) Outside dialysis prescription reviewed , K and Hb addressed above >=3 Data Review (2 of 3)    I independently interpreted: []  Urine Sediment  []  Renal US  []  CXR Images  []  CT Images  []  Other []  EKG Tracing  Any     I discussed: []  Pathology results w/ QHPs(s) from other specialties  []  Procedural findings w/ QHPs(s) from other specialties []  Imaging w/ QHP(s) from other specialties  [x]  Treatment plan w/ QHP(s) from other specialties Plan discussed with primary team Any     Mgm't requires: []  Prescription drug(s)  [mod]  []  Kidney biopsy  [mod]  []  Central line placement  [mod] []  High risk medication use and/or intensive toxicity monitoring [high]  [x]  Renal replacement therapy [high]  []  High risk kidney biopsy [high]  []  Escalation of care  [high]  []  High risk central line placement  [high] RRT: High risk of complications from RRT requiring intensive monitoring Risk        _____________________________________________________________________________________    History of Present Illness: Richard [name] is a 58 y.o. 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 Nephrology has been consulted for evaluation of dialysis needs in the setting of end-stage kidney disease.    Patient reports he had acute onset of diffuse body shaking Thursday morning after waking. Denies any aggravating factors but states resting/sleeping did help. Denies any recent infections, medication changes, lifestyle changes. Further denies any fevers, chill, CP, SOB, tongue biting, incontinence, LE edema. Last HD on 8/2 without any complications per patient.    INPATIENT MEDICATIONS:Current Medications[1]    OUTPATIENT MEDICATIONS:  Prior to Admission medications   Medication Dose, Route, Frequency   acetaminophen  (TYLENOL ) 325 MG tablet 650 mg, Oral, Every 8 hours   amlodipine  (NORVASC ) 10 MG tablet 10 mg, Oral, Daily (standard)   [Paused] apixaban  (ELIQUIS ) 5 mg Tab 5 mg, Oral, 2 times a day (standard)  Paused since Mon 01/21/2024 until manually resumed (Hold Reason: Pre-procedure)   atorvastatin  (LIPITOR ) 40 MG tablet 40 mg, Oral, Daily (standard)  Patient not taking: Reported on 01/24/2024   blood-glucose meter,continuous (DEXCOM G6 RECEIVER) Misc 1 each, Miscellaneous, Daily, Dispense 1 receiver annually.  Sent to [facility]   blood-glucose sensor (DEXCOM G7 SENSOR) Devi Use 1 sensor every 10 days.   blood-glucose transmitter (DEXCOM G6 TRANSMITTER) Devi 1 each, Miscellaneous, Every 3 months, [facility] pharmacy   blood-glucose transmitter (DEXCOM G6 TRANSMITTER) Devi Use as directed to monitor blood glucose   carvedilol  (COREG ) 6.25 MG tablet 6.25 mg, 2 times a day (standard)  Patient not taking: Reported on 01/24/2024   CHOLECALCIFEROL, VITAMIN D3, (VITAMIN D3 ORAL) 2,000 Units, Daily (standard)   famotidine  (PEPCID ) 10 MG tablet 10 mg, Oral, Daily (standard)  Patient not taking: Reported on 01/24/2024   gabapentin  (NEURONTIN ) 300 MG capsule 300 mg, Oral, Nightly   insulin  aspart (NOVOLOG  FLEXPEN U-100 INSULIN ) 100 unit/mL (3 mL) injection pen 35 Units, Subcutaneous, 3 times a day (AC)   insulin  degludec (TRESIBA  FLEXTOUCH U-200) 200 unit/mL (3 mL) InPn 60 Units, Subcutaneous, Daily (standard), Max dose of 100 units per day.   lactulose  (CEPHULAC ) 20 gram packet 20 g, Oral, 3 times a day (standard),  Target 3-5 bowel movements/day   lactulose  10 gram/15 mL solution 20 g, Oral, 3 times a day (standard)   lanthanum  (FOSRENOL ) 1000 MG chewable tablet 1,000 mg, Oral, 3 times a day (with meals)   multivitamin (TAB-A-VITE/THERAGRAN) per tablet 1 tablet, Daily (standard)   mycophenolate  (CELLCEPT ) 250 mg capsule 250 mg, Oral, 2 times a day (standard)   oxyCODONE  (ROXICODONE ) 5 mg/5 mL solution Take 10 mL (10 mg total) by mouth every four (4) hours as needed for pain.   pantoprazole  (PROTONIX ) 40 MG tablet 40 mg, Oral, Daily before breakfast  Patient not taking: Reported on 01/24/2024   pen needle, diabetic (TRUEPLUS PEN NEEDLE) 32 gauge x 5/32 (4 mm) Ndle Use with insulin  up to 4 times a day as needed.   pen needle, diabetic 32 gauge x 5/32 (4 mm) Ndle Use with insulin  up to 4 times/day as needed.   rifAXIMin  (XIFAXAN ) 550 mg Tab 550 mg, Oral, 2 times a day (standard)   sevelamer  (RENVELA ) 800 mg tablet 1,600 mg, Oral, 3 times a day (with meals)   tacrolimus  (PROGRAF ) 0.5 MG capsule 1 mg, Oral, 2 times a day   [Paused] tirzepatide  (MOUNJARO ) 12.5 mg/0.5 mL PnIj 12.5 mg, Subcutaneous, Every 7 days  Paused since Mon 01/21/2024 until manually resumed [Paused] tirzepatide  (MOUNJARO ) 15 mg/0.5 mL PnIj 15 mg, Subcutaneous, Every 7 days  Paused since Mon 01/21/2024 until manually resumed   ursodiol  (ACTIGALL ) 300 mg capsule 600 mg, Oral, 2 times a day (standard)        ALLERGIES  Patient has no known allergies.    MEDICAL HISTORY  Past Medical History[2]    SOCIAL HISTORY  Social History     Social History Narrative    From [place]    He was in the [employer].    Now lives in [place]    He works in Engineer, petroleum        Married    1 child (age 59.5)      reports that he has never smoked. He has been exposed to tobacco smoke. He has never used smokeless tobacco. He reports that he does not drink alcohol and does not use drugs.     FAMILY HISTORY  Family History[3]     Physical Exam:  Vitals:    01/28/24 0719   BP: 116/70   Pulse: 57   Resp: 18   Temp: 36.4 ??C (97.5 ??F)   SpO2: 100%     No intake/output data recorded.  No intake or output data in the 24 hours ending 01/28/24 1420    General: well-appearing, no acute distress  Heart: RRR, no m/r/g  Lungs: CTAB, normal wob  Abd: soft, non-tender, non-distended  Ext: trace edema  Access: Left IJ tunneled catheter ; LUE swollen without erythema              [1]   Current Facility-Administered Medications:     acetaminophen  (TYLENOL ) tablet 650 mg, Oral, Q6H PRN    aluminum -magnesium  hydroxide-simethicone  (MAALOX MAX) 80-80-8 mg/mL oral suspension, Oral, Q4H PRN    amlodipine  (NORVASC ) tablet 10 mg, Oral, Daily    apixaban  (ELIQUIS ) tablet 5 mg, Oral, BID    dextrose  50 % in water (D50W) 50 % solution 12.5 g, Intravenous, Q15 Min PRN    glucagon  injection 1 mg, Intramuscular, Once PRN    glucose chewable tablet 16 g, Oral, Q10 Min PRN    guaiFENesin  (ROBITUSSIN) oral syrup, Oral, Q4H PRN    hydrOXYzine  (ATARAX ) tablet  25 mg, Oral, Q6H PRN    insulin  glargine (LANTUS ) injection BASAL 15 Units, Subcutaneous, Nightly    insulin  lispro (HumaLOG ) injection CORRECTIONAL 0-20 Units, Subcutaneous, ACHS    lactulose  (CEPHULAC ) packet 20 g, Oral, TID    melatonin tablet 3 mg, Oral, Nightly PRN    multivitamins, therapeutic with minerals tablet 1 tablet, Oral, Daily    mycophenolate  (CELLCEPT ) capsule 250 mg, Oral, BID    oxyCODONE  (ROXICODONE ) immediate release tablet 10 mg, Oral, Q4H PRN    prochlorperazine  (COMPAZINE ) injection 2.5 mg, Intravenous, Q8H PRN **OR** prochlorperazine  (COMPAZINE ) injection 5 mg, Intravenous, Q8H PRN    rifAXIMin  (XIFAXAN ) tablet 550 mg, Oral, BID    senna (SENOKOT) tablet 2 tablet, Oral, Nightly PRN    sevelamer  (RENVELA ) tablet 1,600 mg, Oral, 3xd Meals    tacrolimus  (PROGRAF ) capsule 1 mg, Oral, BID    ursodiol  (ACTIGALL ) capsule 600 mg, Oral, BID  [2]   Past Medical History:  Diagnosis Date    CHF (congestive heart failure)        COVID-19 07/18/2019    Diabetes mellitus        Family history of malignant neoplasm of prostate 06/23/2015    Heart disease     Heart murmur     Hepatic cirrhosis    06/23/2015    Overview:  Secondary to NASH; followed by Dr. Lindaann, GI.  Last Assessment & Plan:  Relevant Hx: Course: Daily Update: Today's Plan:     Hypertension     Hypogonadism in male 02/12/2014    Kidney stone     Liver cirrhosis secondary to NASH (nonalcoholic steatohepatitis)        s/p liver transplant 2013    Obesity    [3]   Family History  Problem Relation Age of Onset    Diabetes Father     Diabetes Paternal Grandfather     Liver disease Maternal Uncle     Cancer Maternal Grandmother     Melanoma Neg Hx     Basal cell carcinoma Neg Hx     Squamous cell carcinoma Neg Hx     Kidney disease Neg Hx

## 2024-01-28 NOTE — Unmapped (Addendum)
 Tacrolimus  Therapeutic Monitoring Pharmacy Note    Richard [name] is a 58 y.o. male continuing tacrolimus .     Indication: Liver transplant     Date of Transplant: 02/2012      Prior Dosing Information: Home regimen tacrolimus  1 mg BID      Source(s) of information used to determine prior to admission dosing: Home Medication List, Fill HIstory, or MAR    Goals:  Therapeutic Drug Levels  Tacrolimus  trough goal: 2-4 ng/mL    Additional Clinical Monitoring/Outcomes  Monitor renal function (SCr and urine output) and liver function (LFTs)  Monitor for signs/symptoms of adverse events (e.g., hyperglycemia, hyperkalemia, hypomagnesemia, hypertension, headache, tremor)    Previous Lab Values  Tacrolimus , Trough   Date/Time Value Ref Range Status   01/28/2024 05:57 AM 8.1 5.0 - 15.0 ng/mL Final   01/21/2024 04:49 AM 6.9 5.0 - 15.0 ng/mL Final   01/20/2024 05:22 AM 6.1 5.0 - 15.0 ng/mL Final   01/18/2024 03:51 AM 7.0 5.0 - 15.0 ng/mL Final   01/16/2024 06:02 AM 5.2 5.0 - 15.0 ng/mL Final   10/06/2014 09:00 AM 6.8 <=20.0 ng/mL Final   08/31/2014 08:34 AM 4.2  Final   08/03/2014 08:22 AM 4.7  Final   07/07/2014 08:37 AM 4.2  Final   06/09/2014 08:27 AM 3.5  Final     Tacrolimus , Timed   Date/Time Value Ref Range Status   01/27/2024 06:51 AM 7.8 ng/mL Final   01/19/2024 08:24 AM 5.3 ng/mL Final       Result:  Tacrolimus  level from 8/4 was drawn 3 hours early     Pharmacokinetic Considerations and Significant Drug Interactions:  Concurrent CYP3A4 substrates/inhibitors: None identified    Assessment/Plan:  Recommendedation(s)  Continue current regimen of tacrolimus  1 mg BID per transplant hepatology    Follow-up  Next level should be ordered on 01/29/24 at 0600.   A pharmacist will continue to monitor and recommend levels as appropriate    Please page service pharmacist with questions/clarifications.    [name] JINNY Haas, PharmD

## 2024-01-28 NOTE — Unmapped (Signed)
 Hepatology Consult Service   Progress Note         Assessment and Recommendations:   Richard [name] is a 58 y.o. 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 The patient is seen in consultation at the request of Ozell Jenna Mart, MD ([facility] (MDW)) for history of liver transplant.    [name] cirrhosis s/p liver transplantation 2013  Concern for graft dysfunction   Has had recurrent admissions for suspected hepatic encephalopathy.  There is ongoing concern of cirrhosis of his graft despite biopsy not being consistent with this.  Currently admitted with several days of rigors and confusion in the setting of running out of lactulose  with asterixis on exam.  Reassuringly mental status and asterixis has improved with lactulose .  Infectious workup unremarkable to date.  Reasonable to obtain MRI elastography while admitted to determine appropriate course of treatment going forward (i.e. whether lactulose  and rifaximin  are necessary going forward).    Recommendations:  - Please order MRI elastography without contrast  - Follow-up blood cultures  - Continue lactulose , rifaximin   - Continue tacrolimus , CellCept , ursodiol   - Trend CBC, CMP, INR at least daily    MELD 3.0: 19 at 01/28/2024  4:14 AM  MELD-Na: 21 at 01/28/2024  4:14 AM  Calculated from:  Serum Creatinine: On dialysis. Using the maximum value.  Serum Sodium: 138 mmol/L (Using max of 137 mmol/L) at 01/28/2024  4:14 AM  Total Bilirubin: 0.5 mg/dL (Using min of 1 mg/dL) at 06/30/7972  5:85 AM  Serum Albumin: 2.7 g/dL at 06/30/7972  5:85 AM  INR(ratio): 1.14 at 01/28/2024  4:14 AM  Age at listing (hypothetical): 58 years  Sex: Male at 01/28/2024  4:14 AM    Issues Impacting Complexity of Management:  -The patient has the need for intensive monitoring parameter(s) due to high-risk of clinical decline: frequent monitoring of MELD score to monitor for progression to/progression of acute or acute on chronic liver failure    Recommendations discussed with the patient's primary team. We will continue to follow along with you.    Subjective:   No events overnight.  Says his mental status is much improved.  No fevers or chills.    Objective:   Temp:  [36.4 ??C (97.5 ??F)-36.7 ??C (98.1 ??F)] 36.4 ??C (97.5 ??F)  Pulse:  [57-69] 59  SpO2 Pulse:  [60] 60  Resp:  [18] 18  BP: (116-165)/(70-103) 144/93  SpO2:  [97 %-100 %] 100 %    Gen: Chronically ill-appearing male in NAD, drowsy but answers questions appropriately  Eyes: Sclera anicteric  Abdomen: Normoactive bowel sounds, soft, NTND, no rebound/guarding, no hepatosplenomegaly  Extremities: No clubbing, cyanosis, or edema in the BLEs  Neuro: Normal speech. No asterixis.   Skin: No rashes, lesions on clothed exam  Psych: Alert, normal mood and affect.     Pertinent Labs & Studies:  -I have reviewed the patient's labs from 01/28/24 which show stable Hgb, stable renal function (SCr, electrolytes), stable LFTs, and stable INR

## 2024-01-28 NOTE — Unmapped (Signed)
 Shift Summary  Patient alert and oriented x4 this shift. Vital signs stable on room air. Patient complaining of abdominal pain and left arm pain. PVL of left arm completed, per MD note shows blood clot, started patient on eliquis . Patient with some anxiety this shift. MD notified. Atarax  administered as ordered. Patient complaining of heartburn Pepcid  administered as ordered. Blood sugars monitored and managed.    Blood glucose levels decreased throughout the shift with insulin  administration.   Pain remained constant despite medication administration.   Blood pressure increased significantly during the shift.   Peripheral vascular assessment noted non-pitting edema in the left upper extremity.   Overall, the patient's status showed changes in blood pressure and glucose levels, with persistent pain.     Optimal Pain Control and Function: Pain remained constant at a level of 7 throughout the shift, with sharp and stabbing pain in the abdomen and arm, despite administration of oxyCODONE  PRN.     Blood Glucose Level Within Target Range: Blood glucose levels decreased from 268 mg/dL to 779 mg/dL over the shift, with correctional insulin  doses adjusted accordingly.     Effective Tissue Perfusion: Blood pressure increased from 116/70 to 144/93, and MAP increased from 83 to 108, while SpO2 remained at 100%.     Tissue Perfusion: Peripheral vascular assessment noted exceptions to WDL, with non-pitting edema in the left upper extremity.

## 2024-01-28 NOTE — Unmapped (Incomplete)
 Shift Summary  Patient admitted for chills and myoclonic jerking.  Acetaminophen  and oxyCODONE  were administered for pain, resulting in a decrease in pain level.    The patient refused lactulose , citing multiple bowel movements.    Pain was reassessed at 5 on the pain scale, indicating a decrease from earlier in the shift.    Overall, the patient experienced a decrease in pain but declined further interventions.     Optimal Pain Control and Function: Pain decreased from 7 to 5 on the pain scale after administration of acetaminophen  and oxyCODONE , but the patient declined further intervention.     Skin Health and Integrity: Bruising and redness were noted on the right upper extremity, with no changes in moisture, friction, or shear throughout the shift.

## 2024-01-28 NOTE — Unmapped (Signed)
 Shift Summary  Patient was admitted for chills and myoclonic jerking.  Vital signs remain stable on room air.  Patient was unable to sleep much throughout the night.  Acetaminophen  and oxyCODONE  were administered for pain, resulting in a decrease in pain level.    The patient refused lactulose , citing multiple bowel movements (5).    Pain was reassessed at 5 on the pain scale, indicating a decrease from earlier in the shift.    Overall, the patient experienced a decrease in pain but declined further interventions.     Optimal Pain Control and Function: Pain decreased from 7 to 5 on the pain scale after administration of acetaminophen  and oxyCODONE , but the patient declined further intervention.     Skin Health and Integrity: Bruising and redness were noted on the right upper extremity, with no changes in moisture, friction, or shear throughout the shift.

## 2024-01-29 LAB — HEPATIC FUNCTION PANEL
ALBUMIN: 3.2 g/dL — ABNORMAL LOW (ref 3.4–5.0)
ALKALINE PHOSPHATASE: 245 U/L — ABNORMAL HIGH (ref 46–116)
ALT (SGPT): <7 U/L — ABNORMAL LOW (ref 10–49)
AST (SGOT): 14 U/L (ref <=34)
BILIRUBIN DIRECT: 0.3 mg/dL (ref 0.00–0.30)
BILIRUBIN TOTAL: 0.5 mg/dL (ref 0.3–1.2)
PROTEIN TOTAL: 7 g/dL (ref 5.7–8.2)

## 2024-01-29 LAB — BASIC METABOLIC PANEL
ANION GAP: 14 mmol/L (ref 5–14)
BLOOD UREA NITROGEN: 32 mg/dL — ABNORMAL HIGH (ref 9–23)
BUN / CREAT RATIO: 2
CALCIUM: 7.6 mg/dL — ABNORMAL LOW (ref 8.7–10.4)
CHLORIDE: 101 mmol/L (ref 98–107)
CO2: 25 mmol/L (ref 20.0–31.0)
CREATININE: 13.22 mg/dL — ABNORMAL HIGH (ref 0.73–1.18)
EGFR CKD-EPI (2021) MALE: 4 mL/min/1.73m2 — ABNORMAL LOW (ref >=60)
GLUCOSE RANDOM: 316 mg/dL — ABNORMAL HIGH (ref 70–179)
POTASSIUM: 3.6 mmol/L (ref 3.4–4.8)
SODIUM: 140 mmol/L (ref 135–145)

## 2024-01-29 LAB — CBC
HEMATOCRIT: 35.4 % — ABNORMAL LOW (ref 39.0–48.0)
HEMOGLOBIN: 12.1 g/dL — ABNORMAL LOW (ref 12.9–16.5)
MEAN CORPUSCULAR HEMOGLOBIN CONC: 34 g/dL (ref 32.0–36.0)
MEAN CORPUSCULAR HEMOGLOBIN: 29.6 pg (ref 25.9–32.4)
MEAN CORPUSCULAR VOLUME: 87 fL (ref 77.6–95.7)
MEAN PLATELET VOLUME: 8.5 fL (ref 6.8–10.7)
PLATELET COUNT: 223 10*9/L (ref 150–450)
RED BLOOD CELL COUNT: 4.07 10*12/L — ABNORMAL LOW (ref 4.26–5.60)
RED CELL DISTRIBUTION WIDTH: 13.6 % (ref 12.2–15.2)
WBC ADJUSTED: 6.1 10*9/L (ref 3.6–11.2)

## 2024-01-29 LAB — TACROLIMUS LEVEL, TROUGH: TACROLIMUS, TROUGH: 5.3 ng/mL (ref 5.0–15.0)

## 2024-01-29 LAB — PHOSPHORUS: PHOSPHORUS: 6.4 mg/dL — ABNORMAL HIGH (ref 2.4–5.1)

## 2024-01-29 LAB — PROTIME-INR
INR: 1.11
PROTIME: 12.7 s — ABNORMAL HIGH (ref 9.9–12.6)

## 2024-01-29 MED ADMIN — hydrOXYzine (ATARAX) tablet 25 mg: 25 mg | ORAL | @ 14:00:00

## 2024-01-29 MED ADMIN — oxyCODONE (ROXICODONE) immediate release tablet 10 mg: 10 mg | ORAL | @ 01:00:00 | Stop: 2024-01-30

## 2024-01-29 MED ADMIN — oxyCODONE (ROXICODONE) immediate release tablet 10 mg: 10 mg | ORAL | @ 08:00:00 | Stop: 2024-01-30

## 2024-01-29 MED ADMIN — famotidine (PEPCID) tablet 10 mg: 10 mg | ORAL | @ 18:00:00

## 2024-01-29 MED ADMIN — multivitamins, therapeutic with minerals tablet 1 tablet: 1 | ORAL | @ 18:00:00

## 2024-01-29 MED ADMIN — tacrolimus (PROGRAF) capsule 1 mg: 1 mg | ORAL | @ 19:00:00

## 2024-01-29 MED ADMIN — alteplase (ACTIVase) injection small catheter clearance 2 mg: 2 mg | @ 13:00:00 | Stop: 2024-01-29

## 2024-01-29 MED ADMIN — gentamicin-sodium citrate lock solution in NS: 2.1 mL | @ 17:00:00

## 2024-01-29 MED ADMIN — insulin lispro (HumaLOG) injection CORRECTIONAL 0-20 Units: 0-20 [IU] | SUBCUTANEOUS | @ 11:00:00

## 2024-01-29 MED ADMIN — rifAXIMin (XIFAXAN) tablet 550 mg: 550 mg | ORAL | @ 01:00:00 | Stop: 2024-02-09

## 2024-01-29 MED ADMIN — ursodiol (ACTIGALL) capsule 600 mg: 600 mg | ORAL | @ 01:00:00

## 2024-01-29 MED ADMIN — amlodipine (NORVASC) tablet 10 mg: 10 mg | ORAL | @ 18:00:00

## 2024-01-29 MED ADMIN — insulin glargine (LANTUS) injection BASAL 15 Units: 15 [IU] | SUBCUTANEOUS | @ 01:00:00

## 2024-01-29 MED ADMIN — epoetin alfa-EPBX (RETACRIT) injection 4,000 Units: 4000 [IU] | INTRAVENOUS | @ 14:00:00

## 2024-01-29 MED ADMIN — insulin lispro (HumaLOG) injection CORRECTIONAL 0-20 Units: 0-20 [IU] | SUBCUTANEOUS | @ 01:00:00

## 2024-01-29 MED ADMIN — apixaban (ELIQUIS) tablet 5 mg: 5 mg | ORAL | @ 01:00:00

## 2024-01-29 MED ADMIN — lactulose (CEPHULAC) packet 20 g: 20 g | ORAL | @ 01:00:00

## 2024-01-29 MED ADMIN — sevelamer (RENVELA) tablet 1,600 mg: 1600 mg | ORAL | @ 21:00:00

## 2024-01-29 MED ADMIN — sevelamer (RENVELA) tablet 1,600 mg: 1600 mg | ORAL | @ 18:00:00

## 2024-01-29 MED ADMIN — heparin (porcine) 1000 unit/mL injection 4,000 Units: 4000 [IU] | @ 15:00:00

## 2024-01-29 MED ADMIN — mycophenolate (CELLCEPT) capsule 250 mg: 250 mg | ORAL | @ 01:00:00

## 2024-01-29 MED ADMIN — insulin lispro (HumaLOG) injection CORRECTIONAL 0-20 Units: 0-20 [IU] | SUBCUTANEOUS | @ 21:00:00

## 2024-01-29 MED ADMIN — tacrolimus (PROGRAF) capsule 1 mg: 1 mg | ORAL | @ 01:00:00

## 2024-01-29 MED ADMIN — insulin lispro (HumaLOG) injection CORRECTIONAL 0-20 Units: 0-20 [IU] | SUBCUTANEOUS | @ 18:00:00

## 2024-01-29 MED ADMIN — oxyCODONE (ROXICODONE) immediate release tablet 10 mg: 10 mg | ORAL | @ 16:00:00 | Stop: 2024-01-30

## 2024-01-29 MED ADMIN — hydrOXYzine (ATARAX) tablet 25 mg: 25 mg | ORAL | @ 05:00:00

## 2024-01-29 MED ADMIN — apixaban (ELIQUIS) tablet 5 mg: 5 mg | ORAL | @ 19:00:00

## 2024-01-29 MED ADMIN — mycophenolate (CELLCEPT) capsule 250 mg: 250 mg | ORAL | @ 19:00:00

## 2024-01-29 NOTE — Unmapped (Signed)
 Shift Summary  Alert and oriented x4 this shift. Vital signs stable on room air. Patient went to dialysis today, tolerating well. Blood glucose monitored and managed. Self care.   Blood glucose was measured at 227 mg/dL, and correctional insulin  was administered.    Abdominal pain decreased from a score of 6 to 3 after oxyCODONE  administration in the Dialysis Department.    Blood cultures showed no growth at 72 hours, and infection prevention measures were maintained.    Blood pressure fluctuated significantly, with a notable drop to 91/35 at 1:15 PM.    Overall, the patient's condition showed improvement in pain management and infection prevention.     Optimal Pain Control and Function: Abdominal pain was reported as aching and intermittent, with a pain score of 6 at 11:51 AM, which decreased to 3 by 12:30 PM after oxyCODONE  was administered in the Dialysis Department.     Safe, Effective Therapy Delivery: During hemodialysis, blood flow rate was maintained at 300 mL/min until 1:30 PM when it dropped to 0 mL/min, and arterial pressure showed fluctuations with a significant drop at 1:30 PM.     Effective Tissue Perfusion: Blood pressure fluctuated throughout the shift, with a notable drop to 91/35 at 1:15 PM, and MAP values were recorded at 103 and 106 mmHg later in the shift.     Absence of Infection Signs and Symptoms: Blood cultures taken at 2:30 PM and 3:45 PM showed no growth at 72 hours, and infection prevention measures were consistently applied.

## 2024-01-29 NOTE — Unmapped (Signed)
 Shift Summary  Pt a/ox4 on RA. Up ad lib in the room.   Prn oxycodone  given for pain mgmt.  Pt complained of shaking and anxiety. Notified provider of shaking.   hydrOXYzine  was administered for anxiety management.    Overall, pain management was effective, and comfort improved throughout the shift.     Optimal Comfort and Wellbeing: Blood pressure decreased from 174/101 to 147/112 during the shift, and respiratory rate decreased from 20 to 18.     Absence of Infection Signs and Symptoms: Aseptic technique was maintained, and personal protective equipment was utilized.     Skin Health and Integrity: Positioning was frequently adjusted with self-turning noted, and pressure reduction techniques were encouraged.     Optimal Pain Control and Function: Pain in the left lower abdomen decreased from 7 to 1 after administration of oxyCODONE .

## 2024-01-29 NOTE — Unmapped (Signed)
 HEMODIALYSIS NURSE PROCEDURE NOTE    Treatment Number:  1 Room/Station:  8 Procedure Date:  01/29/24   Total Treatment Time:  326 Min.    CONSENT:  Written consent was obtained prior to the procedure and is detailed in the medical record. Prior to the start of the procedure, a time out was taken and the identity of the patient was confirmed via name, medical record number and date of birth.     WEIGHTS:   Date/Time Pre-Treatment Weight (kg) Estimated Dry Weight (kg) Patient Goal Weight (kg) Total Goal Weight (kg)    01/29/24 0737 123.5 kg (272 lb 4.3 oz)  120.5 kg (265 lb 10.5 oz)  3 kg (6 lb 9.8 oz)  3.55 kg (7 lb 13.2 oz)          Date/Time Post-Treatment Weight (kg) Treatment Weight Change (kg)    01/29/24 1335 121.1 kg (266 lb 15.6 oz)  -2.41 kg      Active Dialysis Orders (168h ago, onward)       Start     Ordered    01/29/24 0727  Hemodialysis inpatient  Every Tue,Thu,Sat      Question Answer Comment   Patient HD Status: Chronic    New Start? No    K+ 2 meq/L    Ca++ 2.5 meq/L    Bicarb 35 meq/L    Na+ 137 meq/L    Na+ Modeling No    Dialyzer [id number]    Dialysate Temperature (C) 36.5    BFR-As tolerated to a maximum of: 400 mL/min    DFR 800 mL/min    Duration of treatment 4 Hr    Dry weight (kg) 120.5    Challenge dry weight (kg) no    Fluid removal (L) to EDW    Tubing Adult = 142 ml    Access Site Dialysis Catheter    Access Site Location Left    Keep SBP >: 100        01/29/24 0726                  ACCESS SITE:       Hemodialysis Catheter 01/18/24 Left Internal jugular (Active)   Site Assessment Clean;Dry;Intact 01/29/24 1336   Proximal Lumen Status / Patency Blood Return - Brisk 01/29/24 1336   Proximal Lumen Intervention Deaccessed 01/29/24 1336   Medial Lumen Status / Patency Blood Return - Brisk 01/29/24 1336   Medial Lumen Intervention Deaccessed 01/29/24 1336   Dressing Intervention No intervention needed 01/29/24 1336   Dressing Status      Clean;Dry;Intact/not removed 01/29/24 1336   Verification by X-ray Yes 01/29/24 1336   Site Condition No complications 01/29/24 1336   Dressing Type CHG gel;Occlusive 01/29/24 1336   Dressing Change Due 02/04/24 01/29/24 1336   Line Necessity Reviewed? Y 01/29/24 1336   Line Necessity Indications Yes - Hemodialysis 01/29/24 1336   Line Necessity Reviewed With med w 01/29/24 0115        Arteriovenous Fistula - Vein Graft  Access 11/29/22 1216 Arteriovenous fistula Right Forearm (Active)   Site Assessment Clean;Dry;Intact 01/29/24 0115   AV Fistula Thrill Absent;Bruit Absent 01/29/24 0115   Status Deaccessed 01/29/24 0115   Dressing Intervention No intervention needed 01/29/24 0115   Dressing Status      No dressing 01/29/24 0115   Site Condition No complications 01/29/24 0115   Dressing Open to air (None) 01/29/24 0115   Dressing Drainage Description Other (Comment) 01/19/23 0201   Dressing  Change Due 01/19/24 01/18/24 1730     Catheter Fill Volumes:  Arterial:  2.1 mL Venous:  2.1 mL   Catheter filled with Gentamicin  citrate lock post procedure.    Patient Lines/Drains/Airways Status       Active Peripheral & Central Intravenous Access       Name Placement date Placement time Site Days    Hemodialysis Catheter 01/18/24 Left Internal jugular 01/18/24  1634  Internal jugular  10                  LAB RESULTS:  Lab Results   Component Value Date    NA 140 01/29/2024    K 3.6 01/29/2024    CL 101 01/29/2024    CO2 25.0 01/29/2024    BUN 32 (H) 01/29/2024    CREATININE 13.22 (H) 01/29/2024    GLU 316 (H) 01/29/2024    GLUF 138 10/06/2014    CALCIUM  7.6 (L) 01/29/2024    CAION 4.57 11/20/2022    ICAV 4.88 03/04/2012    PHOS 6.4 (H) 01/29/2024    MG 2.2 01/26/2024    PTH 267.5 (H) 10/14/2021    IRON 95 11/09/2022    LABIRON 36 11/09/2022    TRANSFERRIN 212.0 (L) 11/09/2022    FERRITIN 169.8 11/09/2022    TIBC 267.1 11/09/2022     Lab Results   Component Value Date    WBC 6.1 01/29/2024    HGB 12.1 (L) 01/29/2024    HCT 35.4 (L) 01/29/2024    PLT 223 01/29/2024    PHART 7.36 03/04/2012    PO2ART 88 03/04/2012    PCO2ART 43 03/04/2012    HCO3ART 23.4 03/04/2012    BEART -1.3 03/04/2012    O2SATART 98.1 03/04/2012    PTINR : 05/06/2012    APTT 290.0 (HH) 01/19/2024        VITAL SIGNS:   Date/Time Temp Temp src       01/29/24 1330 36.5 ??C (97.7 ??F)  --        Date/Time Pulse BP MAP (mmHg) Patient Position    01/29/24 1330 65  178/90  --  Lying     01/29/24 1315 52  91/35  --  Lying     01/29/24 1310 71  145/106  --  Lying     01/29/24 1230 66  127/88  --  Lying     01/29/24 1200 64  175/90  --  Lying     01/29/24 1130 66  150/83  --  Lying     01/29/24 1100 63  134/72  --  Lying     01/29/24 1030 66  154/91  --  Lying     01/29/24 1000 58  118/91  --  --         Date/Time Resp SpO2 O2 Device FiO2 (%) O2 Flow Rate (L/min)    01/29/24 1330 17  --  None (Room air)  -- --    01/29/24 1315 18  --  --  -- --    01/29/24 1310 18  --  None (Room air)  -- --    01/29/24 1230 18  --  None (Room air)  -- --    01/29/24 1200 17  --  --  -- --    01/29/24 1130 18  --  None (Room air)  -- --    01/29/24 1100 17  --  None (Room air)  -- --    01/29/24 1030 18  --  None (Room air)  -- --    01/29/24 1000 20  --  None (Room air)  -- --     Oxygen Connected to Wall:  no     Date/Time Therapy Number Dialyzer All Machine Alarms Passed Air Detector Dialysis Flow (mL/min)    01/29/24 0737 1  F-180 (98 mLs)  Yes  Engaged  800 mL/min       Date/Time Verify Priming Solution Priming Volume Hemodialysis Independent pH Hemodialysis Machine Conductivity (mS/cm) Hemodialysis Independent Conductivity (mS/cm)    01/29/24 0737 0.9% NS  300 mL  --  13.6 mS/cm  13.5 mS/cm       Date/Time Bicarb Conductivity Residual Bleach Negative Free Chlorine Total Chlorine Chloramine    01/29/24 0737 -- Yes  -- 0  --      Date/Time Pre-Hemodialysis Comments    01/29/24 0737 alert,orientedx4.came in a w/c       Date/Time Blood Flow Rate (mL/min) Arterial Pressure (mmHg) Venous Pressure (mmHg) Transmembrane Pressure (mmHg)    01/29/24 1315 300 mL/min  -174 mmHg  123 mmHg  19 mmHg     01/29/24 1310 300 mL/min  -168 mmHg  119 mmHg  21 mmHg     01/29/24 1230 300 mL/min  -165 mmHg  114 mmHg  23 mmHg     01/29/24 1200 285 mL/min  -155 mmHg  118 mmHg  19 mmHg     01/29/24 1130 300 mL/min  -168 mmHg  118 mmHg  21 mmHg     01/29/24 1100 300 mL/min  -207 mmHg  115 mmHg  22 mmHg     01/29/24 1030 300 mL/min  -213 mmHg  120 mmHg  25 mmHg     01/29/24 1000 300 mL/min  -180 mmHg  130 mmHg  19 mmHg     01/29/24 0940 300 mL/min  -200 mmHg  120 mmHg  20 mmHg     01/29/24 0804 200 mL/min  -240 mmHg  70 mmHg  50 mmHg       Date/Time Ultrafiltration Rate (mL/hr) Ultrafiltrate Removed (mL) Dialysate Flow Rate (mL/min) KECN (Kecn)    01/29/24 1315 950 mL/hr  2479 mL  800 ml/min  --     01/29/24 1310 950 mL/hr  2401 mL  800 ml/min  --     01/29/24 1230 720 mL/hr  1879 mL  800 ml/min  --     01/29/24 1200 720 mL/hr  1590 mL  800 ml/min  --     01/29/24 1130 710 mL/hr  1358 mL  800 ml/min  --     01/29/24 1100 710 mL/hr  1008 mL  800 ml/min  --     01/29/24 1030 710 mL/hr  658 mL  800 ml/min  --     01/29/24 1000 710 mL/hr  308 mL  800 ml/min  --     01/29/24 0940 630 mL/hr  0 mL  800 ml/min  --     01/29/24 0804 630 mL/hr  0 mL  8000 ml/min  --       Date/Time Intra-Hemodialysis Comments    01/29/24 1330 Treatment comp,eted.rinsed with NS     01/29/24 1315 BP low,UF turned off     01/29/24 1310 stable,resting comfortably     01/29/24 1230 stable     01/29/24 1200 stable     01/29/24 1130 stable,resting comfortably     01/29/24 1100 stable     01/29/24 1030 stable,resting comfortably     01/29/24 1000 stable  01/29/24 0940 Treatment restarted.alteplase  removed from both ports     01/29/24 0815 rinsed pt back. acces not working     01/29/24 0804 TReatment started per md order       Date/Time Rinseback Volume (mL) On Line Clearance: spKt/V Total Liters Processed (L/min) Dialyzer Clearance    01/29/24 1335 300 mL  --  64.5 L/min  Moderately streaked         Date/Time Post-Hemodialysis Comments    01/29/24 1335 stable post HD      POST TREATMENT ASSESSMENT:  General appearance:  alert  Neurological:  Grossly normal  Lungs:  clear to auscultation bilaterally  Hearts:  regular rate and rhythm, S1, S2 normal, no murmur, click, rub or gallop  Abdomen:  soft, non-tender; bowel sounds normal; no masses,  no organomegaly  Pulses:  2+ and symmetric.  Skin:  Skin color, texture, turgor normal. No rashes or lesions     Date/Time Total Hemodialysis Replacement Volume (mL) Total Ultrafiltrate Output (mL)    01/29/24 1335 --  2000 mL      [id number] - Medicaitons Given During Treatment  (last 4 hrs)           Seleta Hovland C, RN         Medication Name Action Time Action Route Rate Dose User     epoetin  alfa-EPBX (RETACRIT ) injection 4,000 Units 01/29/24 0953 Given Intravenous  4,000 Units Cambria Osten C, RN     gentamicin -sodium citrate  lock solution in NS 01/29/24 1324 Given Intra-cannular  2.1 mL Jaelynn Pozo C, RN     gentamicin -sodium citrate  lock solution in NS 01/29/24 1324 Given Intra-cannular  2.1 mL Yuma Pacella C, RN     heparin  (porcine) 1000 unit/mL injection 4,000 Units 01/29/24 1031 Given Dialysis Circuit  4,000 Units Veryl Abril C, RN     oxyCODONE  (ROXICODONE ) immediate release tablet 10 mg 01/29/24 1151 Given Oral  10 mg Osman Calzadilla C, RN            BYRD, EMILY L, RN         Medication Name Action Time Action Route Rate Dose User     lactulose  (CEPHULAC ) packet 20 g 01/29/24 1026 Not Given Oral  20 g Ebb [name] CROME, RN     mycophenolate  (CELLCEPT ) capsule 250 mg 01/29/24 1219 Not Given Oral  250 mg Byrd, Emily L, RN     rifAXIMin  (XIFAXAN ) tablet 550 mg 01/29/24 1218 Not Given Oral  550 mg Byrd, Emily L, RN     ursodiol  (ACTIGALL ) capsule 600 mg 01/29/24 1219 Not Given Oral  600 mg Byrd, Emily L, RN                      Patient tolerated treatment in a  Dialysis Recliner.

## 2024-01-29 NOTE — Unmapped (Signed)
 Internal Medicine (MEDW) Progress Note    Assessment & Plan:   Richard [name] is a 58 y.o. 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 Tacrolimus , although is associated with myoclonic jerking, have pulled troughs too close together thus likely overestimating active amount of tacrolimus . Will continue current dose as per GI.  - tacrolimus  dosing per pharmacy, repeat appropriate timed trough  - limit opioid medications     DVT left internal jugular vein  Hx DVT in RLE  Acute DVT in left internal jugular vein including possible obstruction and proximal brachiocephalic vein.  Hypercoagulable ISO liver disease.  Patient with history of DVT of popliteal vein and right lower extremity for which patient takes anticoagulation.    - Continue home dose Eliquis  5 mg twice daily     Hx of Liver Transplant (2013) - Concern for graft dysfunction   Patient with hx of liver transplant in 2013. Admitted last week with HE, but after further evaluation suspect this was due to uremia. Liver biopsy on 7/15 without evidence of fibrosis, thus very unlikely to have portal pressure or shunt abnormalities. Being followed by Hepatology and will evaluate pt with MRI elastography. Has a cholestatic pattern on LFTs with only Alk phos elevation, which is downtrending overall.  - Cont. Home Tacrolimus  1mg  BID, dosing per pharmacy   - Cont. Home Mycophenolate  250mg  BID  - Cont. Home Ursodiol  600mg  BID  - Cont. Home Lactulose  20g TID (Titrate to 3-5 Bms daily)  - Cont. Home xifaxan  550mg  BID  - daily MELD score      ESRD on HD TTS  Patient recently completed dialysis session this AM.  - Nephrology consulted, HD on usual schedule  - Cont. Home Renvela  1600mg  TID     Insulin -Dependent T2DM  Patient taking regimen of mounjaro , 60u of tresiba , and 35u of novolog  with meals at home.   - Increasing Lantus  from 15u to 20u tonight.  - SSI  - Titrate insulin  as needed  - HOLD Glp1     HFpEF (EF >55%)  Patient with elevated pro-BNP to 17,137 on admission. Mild pulmonary vascular congestion seen on CXR however proBNP appears to be chronically elevated given previous ProBNP of ~14000 at last admission. Last echo w EF >55%.  - CTM     Chronic Problems  HTN: Cont. Home amlodipine   Chronic Anemia: Likely in setting of renal disease. Cont to monitor with daily cbc.  Daily Checklist:  Diet: Regular Diet  DVT PPx: Heparin  5000units q8h  Electrolytes: No Repletion Needed  Code Status: Full Code  Dispo: home pending clinical improvement    Team Contact Information:   Primary Team: Internal Medicine (MEDW)  Primary Resident: Dwain DELENA Chill, MD, MD  Resident's Pager: [phone] (Gen MedW Intern - Tower)    Interval History:     Patient reports improved  muscle jerking sensation.  He has mild, intermittent abdominal pain.  He denies any fevers, chills, headache, confusion, nausea, vomiting.    Objective:   Temp:  [36.1 ??C (97 ??F)-37.2 ??C (99 ??F)] 36.5 ??C (97.7 ??F)  Pulse:  [52-77] 52  Resp:  [17-22] 18  BP: (91-178)/(35-112) 164/85  SpO2:  [97 %-100 %] 98 %, Intake/Output Summary (Last 24 hours) at 01/29/2024 1712  Last data filed at 01/29/2024 1335  Gross per 24 hour   Intake 180 ml   Output 2000 ml   Net -1820 ml         Gen: NAD, converses   HENT: atraumatic, normocephalic  Heart: RRR  Lungs: CTAB, no crackles or wheezes  Abdomen: obese, soft, NTND  Extremities: No edema; no erythema or warmth on upper extremities     Dwain Chill, M.D., M.T.S.  [facility] Internal Medicine, PGY-1

## 2024-01-29 NOTE — Unmapped (Signed)
 Plan to do 4 hrs with 3 L netUF      Problem: Hemodialysis  Goal: Safe, Effective Therapy Delivery  Outcome: Progressing     Problem: Hemodialysis  Goal: Effective Tissue Perfusion  Outcome: Progressing     Problem: Hemodialysis  Goal: Absence of Infection Signs and Symptoms  Outcome: Progressing

## 2024-01-30 LAB — TACROLIMUS LEVEL, TROUGH: TACROLIMUS, TROUGH: 5.7 ng/mL (ref 5.0–15.0)

## 2024-01-30 LAB — BASIC METABOLIC PANEL
ANION GAP: 15 mmol/L — ABNORMAL HIGH (ref 5–14)
BLOOD UREA NITROGEN: 17 mg/dL (ref 9–23)
BUN / CREAT RATIO: 2
CALCIUM: 8.1 mg/dL — ABNORMAL LOW (ref 8.7–10.4)
CHLORIDE: 100 mmol/L (ref 98–107)
CO2: 24 mmol/L (ref 20.0–31.0)
CREATININE: 9.42 mg/dL — ABNORMAL HIGH (ref 0.73–1.18)
EGFR CKD-EPI (2021) MALE: 6 mL/min/1.73m2 — ABNORMAL LOW (ref >=60)
GLUCOSE RANDOM: 281 mg/dL — ABNORMAL HIGH (ref 70–179)
POTASSIUM: 3.7 mmol/L (ref 3.4–4.8)
SODIUM: 139 mmol/L (ref 135–145)

## 2024-01-30 LAB — HEPATIC FUNCTION PANEL
ALBUMIN: 3 g/dL — ABNORMAL LOW (ref 3.4–5.0)
ALKALINE PHOSPHATASE: 254 U/L — ABNORMAL HIGH (ref 46–116)
ALT (SGPT): <7 U/L — ABNORMAL LOW (ref 10–49)
AST (SGOT): 14 U/L (ref <=34)
BILIRUBIN DIRECT: 0.2 mg/dL (ref 0.00–0.30)
BILIRUBIN TOTAL: 0.4 mg/dL (ref 0.3–1.2)
PROTEIN TOTAL: 6.5 g/dL (ref 5.7–8.2)

## 2024-01-30 LAB — PROTIME-INR
INR: 1.25
PROTIME: 14.3 s — ABNORMAL HIGH (ref 9.9–12.6)

## 2024-01-30 LAB — CBC
HEMATOCRIT: 35.3 % — ABNORMAL LOW (ref 39.0–48.0)
HEMOGLOBIN: 11.6 g/dL — ABNORMAL LOW (ref 12.9–16.5)
MEAN CORPUSCULAR HEMOGLOBIN CONC: 32.9 g/dL (ref 32.0–36.0)
MEAN CORPUSCULAR HEMOGLOBIN: 28.7 pg (ref 25.9–32.4)
MEAN CORPUSCULAR VOLUME: 87.2 fL (ref 77.6–95.7)
MEAN PLATELET VOLUME: 8.6 fL (ref 6.8–10.7)
PLATELET COUNT: 179 10*9/L (ref 150–450)
RED BLOOD CELL COUNT: 4.05 10*12/L — ABNORMAL LOW (ref 4.26–5.60)
RED CELL DISTRIBUTION WIDTH: 13.5 % (ref 12.2–15.2)
WBC ADJUSTED: 5.4 10*9/L (ref 3.6–11.2)

## 2024-01-30 LAB — GAMMA GT: GAMMA GLUTAMYL TRANSFERASE: 123 U/L — ABNORMAL HIGH (ref 0–73)

## 2024-01-30 LAB — PHOSPHORUS: PHOSPHORUS: 5.4 mg/dL — ABNORMAL HIGH (ref 2.4–5.1)

## 2024-01-30 MED ADMIN — mycophenolate (CELLCEPT) capsule 250 mg: 250 mg | ORAL | @ 12:00:00

## 2024-01-30 MED ADMIN — multivitamins, therapeutic with minerals tablet 1 tablet: 1 | ORAL | @ 12:00:00

## 2024-01-30 MED ADMIN — insulin lispro (HumaLOG) injection CORRECTIONAL 0-20 Units: 0-20 [IU] | SUBCUTANEOUS | @ 11:00:00

## 2024-01-30 MED ADMIN — ursodiol (ACTIGALL) capsule 600 mg: 600 mg | ORAL

## 2024-01-30 MED ADMIN — ursodiol (ACTIGALL) capsule 600 mg: 600 mg | ORAL | @ 12:00:00

## 2024-01-30 MED ADMIN — tacrolimus (PROGRAF) capsule 1 mg: 1 mg | ORAL | @ 12:00:00

## 2024-01-30 MED ADMIN — sevelamer (RENVELA) tablet 1,600 mg: 1600 mg | ORAL | @ 17:00:00

## 2024-01-30 MED ADMIN — famotidine (PEPCID) tablet 10 mg: 10 mg | ORAL | @ 12:00:00

## 2024-01-30 MED ADMIN — tacrolimus (PROGRAF) capsule 1 mg: 1 mg | ORAL | @ 02:00:00

## 2024-01-30 MED ADMIN — mycophenolate (CELLCEPT) capsule 250 mg: 250 mg | ORAL | @ 02:00:00

## 2024-01-30 MED ADMIN — apixaban (ELIQUIS) tablet 5 mg: 5 mg | ORAL | @ 02:00:00

## 2024-01-30 MED ADMIN — insulin lispro (HumaLOG) injection CORRECTIONAL 0-20 Units: 0-20 [IU] | SUBCUTANEOUS

## 2024-01-30 MED ADMIN — hydrOXYzine (ATARAX) tablet 25 mg: 25 mg | ORAL | @ 02:00:00

## 2024-01-30 MED ADMIN — lactulose (CEPHULAC) packet 20 g: 20 g | ORAL | @ 12:00:00

## 2024-01-30 MED ADMIN — apixaban (ELIQUIS) tablet 5 mg: 5 mg | ORAL | @ 12:00:00

## 2024-01-30 MED ADMIN — sevelamer (RENVELA) tablet 1,600 mg: 1600 mg | ORAL | @ 12:00:00

## 2024-01-30 MED ADMIN — hydrOXYzine (ATARAX) tablet 25 mg: 25 mg | ORAL | @ 17:00:00

## 2024-01-30 MED ADMIN — rifAXIMin (XIFAXAN) tablet 550 mg: 550 mg | ORAL | @ 12:00:00 | Stop: 2024-02-09

## 2024-01-30 MED ADMIN — lactulose (CEPHULAC) packet 20 g: 20 g | ORAL

## 2024-01-30 MED ADMIN — insulin lispro (HumaLOG) injection CORRECTIONAL 0-20 Units: 0-20 [IU] | SUBCUTANEOUS | @ 22:00:00

## 2024-01-30 MED ADMIN — insulin glargine (LANTUS) injection BASAL 20 Units: 20 [IU] | SUBCUTANEOUS

## 2024-01-30 MED ADMIN — lactulose (CEPHULAC) packet 20 g: 20 g | ORAL | @ 17:00:00

## 2024-01-30 MED ADMIN — insulin lispro (HumaLOG) injection CORRECTIONAL 0-20 Units: 0-20 [IU] | SUBCUTANEOUS | @ 16:00:00

## 2024-01-30 NOTE — Unmapped (Signed)
 Shift Summary  Pt a/ox4 on RA. Up ad lib in the room and around the unit with no complaints of pain.   Bruising and swelling were noted during assessments, indicating exceptions to WDL.    Anxiety was managed with hydrOXYzine  administration.    Anti-Embolism Device Status was updated to eliquis  during the shift.    Overall, the patient's condition required multiple interventions, including medication administration and monitoring of symptoms.     Optimal Comfort and Wellbeing: Anxiety was noted during the shift, and hydrOXYzine  was administered PRN to address this.     Absence of Infection Signs and Symptoms: Aseptic technique was maintained throughout the shift, and environmental surveillance was performed.     Skin Health and Integrity: Bruising was observed scattered across the skin, indicating exceptions to WDL in integumentary assessment.     Optimal Pain Control and Function: Swelling was noted in the left upper extremity, but no specific pain interventions were documented.     Blood Glucose Level Within Target Range: Blood glucose levels were high at 297 mg/dL, and insulin  glargine was administered to manage this.

## 2024-01-30 NOTE — Unmapped (Signed)
 Internal Medicine (MEDW) Progress Note    Assessment & Plan:   JOAN AVETISYAN is a 58 y.o. 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 Tacrolimus , although is associated with myoclonic jerking, have pulled troughs too close together thus likely overestimating active amount of tacrolimus . Will continue current dose as per GI.  - tacrolimus  dosing per pharmacy, repeat appropriate timed trough  - limit opioid medications     DVT left internal jugular vein  Hx DVT in RLE  Acute DVT in left internal jugular vein including possible obstruction and proximal brachiocephalic vein.  Hypercoagulable ISO liver disease.  Patient with history of DVT of popliteal vein and right lower extremity for which patient takes anticoagulation.    - Continue home dose Eliquis  5 mg twice daily     Hx of Liver Transplant (2013) - Concern for graft dysfunction   Patient with hx of liver transplant in 2013. Admitted last week with HE, but after further evaluation suspect this was due to uremia. Liver biopsy on 7/15 without evidence of fibrosis, thus very unlikely to have portal pressure or shunt abnormalities. Being followed by Hepatology and will evaluate pt with MRI elastography. Has a cholestatic pattern on LFTs with only Alk phos and GGT elevation, which is downtrending overall.  - Cont. Home Tacrolimus  1mg  BID, dosing per pharmacy   - Cont. Home Mycophenolate  250mg  BID  - Cont. Home Ursodiol  600mg  BID  - Cont. Home Lactulose  20g TID (Titrate to 3-5 Bms daily)  - Cont. Home xifaxan  550mg  BID  - daily MELD score      ESRD on HD TTS  Per Nephro, patient's blood flow rate has been too low from Left tunneled line. Possibly due to blood clot noted above. Consulted VIR and have not heard when pt's line can be addressed. His fistula is not usable and was going to already be addressed next week as outpt .  - Nephrology consulted, HD on usual schedule  - VIR consulted  - Cont. Home Renvela  1600mg  TID     Insulin -Dependent T2DM  Patient taking regimen of mounjaro , 60u of tresiba , and 35u of novolog  with meals at home.   - Increasing Lantus  from 20u to 25u tonight.  - SSI  - Titrate insulin  as needed  - HOLD Glp1     HFpEF (EF >55%)  Patient with elevated pro-BNP to 17,137 on admission. Mild pulmonary vascular congestion seen on CXR  however proBNP appears to be chronically elevated given previous ProBNP of ~14000 at last admission. Last echo w EF >55%.  - CTM     Chronic Problems  HTN: Cont. Home amlodipine   Chronic Anemia: Likely in setting of renal disease. Cont to monitor with daily cbc.    Daily Checklist:  Diet: Regular Diet  DVT PPx: Heparin  5000units q8h  Electrolytes: No Repletion Needed  Code Status: Full Code  Dispo: home pending clinical improvement    Team Contact Information:   Primary Team: Internal Medicine (MEDW)  Primary Resident: Dwain DELENA Chill, MD, MD  Resident's Pager: [phone] (Gen MedW Intern - Tower)    Interval History:     Patient reports improved muscle jerking sensation.  He has mild chest discomfort and feels it is related to everything he has going on.  He denies any fevers, chills, headache, confusion, nausea, vomiting.    Objective:   Temp:  [36.7 ??C (98.1 ??F)] 36.7 ??C (98.1 ??F)  Pulse:  [68] 68  Resp:  [18] 18  BP: (115-154)/(64-82) 115/64  SpO2:  [98 %] 98 %, No intake or output data in the 24 hours ending 01/30/24 1718        Gen: NAD, converses   HENT: atraumatic, normocephalic  Heart: RRR  Lungs: CTAB, no crackles or wheezes  Abdomen: obese, soft, NTND  Extremities: No edema; no erythema or warmth on upper extremities     Dwain Chill, M.D., M.T.S.  [facility] Internal Medicine, PGY-1

## 2024-01-30 NOTE — Unmapped (Signed)
 Nephrology ESKD Follow-up Consult Note    Assessment/Recommendations: Richard [name] is a 58 y.o. male with a past medical history notable for ESKD on in-center hemodialysis admitted with myoclo.     # ESKD:  - No indications for urgent/emergent HD today based upon volume status, electrolytes or acidemia. We will continue hemodialysis thrice weekly. Please see orders for most up to date prescription.  - Volume: Will attempt to achieve dry weight if tolerated  - Medication list currently appropriate  - Please consult VIR for evaluation of vascular access. Patient with low BFR initially during treatment and so did TPA treatment and BFR remained at 300 post-TPA.   Current dialysis Rx:   EDW:120.5 kg (265 lb 10.5 oz)     Dialyzer:F-180 (98 mLs)   Bath:2 K+ / 2.5 Ca+  ;  Na:137 mEq/L  ;  CO2:35 mEq/L   Recent labs:  Na/K/Cl/CO2:139/3.7/100/24.0 (08/06 0343)    # Anemia of Chronic Kidney Disease:   - Will continue outpatient management with ESA and adjust as indicated. No current need for transfusion.  Lab Results   Component Value Date    HGB 11.6 (L) 01/30/2024    HGB 12.1 (L) 01/29/2024    HGB 12.0 (L) 01/28/2024     Lab Results   Component Value Date    IRON 95 11/09/2022    TIBC 267.1 11/09/2022    FERRITIN 169.8 11/09/2022         # Myoclonic jerking   - Reviewed primary team's daily progress note,   - Evaluation and management per primary team  - No changes to management from a nephrology standpoint at this time    Discussed recommendations with primary team.  Thanks for this consult, we will continue to follow along and provide recommendations as needed.    *Please contact Nephrology PRIOR to hospital discharge so we can assist the primary team and ensure appropriate dialysis related follow-up (ie scheduling, antibiotics, changes in EDW and dialysis access, etc.).DEWAINE Carron Blanch, DO  01/30/2024 12:27 PM   Medical decision-making for 01/30/24  Findings / Data     Patient has: []  acute illness w/systemic sxs [mod]  []  two or more stable chronic illnesses [mod]  []  one chronic illness with acute exacerbation [mod]  []  acute complicated illness  [mod]  []  Undiagnosed new problem with uncertain prognosis  [mod] [x]  illness posing risk to life or bodily function (ex. AKI)  [high]  []  chronic illness with severe exacerbation/progression  [high]  []  chronic illness with severe side effects of treatment  [high] ESKD on RRT Probs At least 2:  Probs, Data, Risk   I reviewed: [x]  primary team note  []  consultant note(s)  []  external records [x]  chemistry results  [x]  CBC results  []  blood gas results  []  Other []  procedure/op note(s)   []  radiology report(s)  []  micro result(s)  []  w/ independent historian(s) Hb addressed above, No hyperK req RRT, per primary  >=3 Data Review (2 of 3)    I independently interpreted: []  Urine Sediment  []  Renal US  []  CXR Images  []  CT Images  []  Other []  EKG Tracing  Any     I discussed: []  Pathology results w/ QHPs(s) from other specialties  []  Procedural findings w/ QHPs(s) from other specialties []  Imaging w/ QHP(s) from other specialties  [x]  Treatment plan w/ QHP(s) from other specialties Plan discussed with primary team Any     Mgm't requires: []  Prescription drug(s)  [  mod]  []  Kidney biopsy  [mod]  []  Central line placement  [mod] []  High risk medication use and/or intensive toxicity monitoring [high]  [x]  Renal replacement therapy [high]  []  High risk kidney biopsy  [high]  []  Escalation of care  [high]  []  High risk central line placement  [high] RRT: High risk of complications from RRT requiring intensive monitoring Risk      _____________________________________________________________________________________    Subjective/Interval Events:  Patient reports he feels very sleeping, fatigued this morning. Also states he has very little appetite. Denies any fevers, chills, CP, SOB.    Physical Exam:  Vitals:    01/29/24 1417 01/29/24 1600 01/30/24 0730 01/30/24 0816   BP: 139/95 164/85 154/82 115/64   Pulse:  52 68    Resp:  18 18    Temp:  36.5 ??C (97.7 ??F) 36.7 ??C (98.1 ??F)    TempSrc:  Oral Oral    SpO2:  98% 98%    Weight:         No intake/output data recorded.    Intake/Output Summary (Last 24 hours) at 01/30/2024 1227  Last data filed at 01/29/2024 1335  Gross per 24 hour   Intake --   Output 2000 ml   Net -2000 ml     General: well-appearing, no acute distress  CV: RRR, no m/r/g.  Lungs: normal work of breathing  Ext: no edema

## 2024-01-30 NOTE — Unmapped (Cosign Needed)
 Patient is seen in consultation at the request of Ozell Jenna Mart, MD with [facility] (MDW) to evaluate for possible tunneled catheter exchange.    Assessment & Recommendations     Richard [name] is a 58 y.o. 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 Nephrology recommended VIR consult for evaluation of vascular access due to low BFR initially during treatment that remained at 300 post-TPA.    - Recommend proceeding with tunneled catheter exchange.  - Anticipated procedure date: 02/01/24, pending VIR schedule  - Sedation plan: Moderate sedation  - NPO at midnight the night prior to procedure.  - This is a low bleeding risk procedure.  INR <= 3.0  Platelets >= 20  Anticoagulation does not need to be held.  - Please obtain potassium same day as procedure.  - Iodinated contrast allergy: No.    This plan was discussed with Dr. Nat Knee.    Informed Consent  Patient will need to be consented 8/7, patient at MRI.     Thank you for involving us  in the care of this patient. Please page the VIR consult pager [phone]) with further questions, concerns, or if new issues arise.     Subjective     History of Present Illness:  Richard [name] is a 58 y.o. 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 He was noted to have low blood flow in dialysis on Tuesday 01/29/24. Tunneled HD catheter (14.5 Fr 28 cm dual lumen LIJ) initially placed 01/18/24 following failed declot of right AVF declot.         Review of Systems: Pertinent items are noted in HPI.    Past Medical History:  Past Medical History[1]    Past Surgical History:  Past Surgical History[2]    Allergies:  Allergies[3]     Objective     Physical Exam:  Vitals:    01/30/24 0816   BP: 115/64   Pulse:    Resp:    Temp:    SpO2:               Pertinent Labs:  Recent Labs     01/28/24  0414 01/29/24  0807 01/30/24  0343   WBC 5.6 6.1 5.4   HGB 12.0* 12.1* 11.6*   HCT 36.8* 35.4* 35.3*   PLT 171 223 179       Recent Labs     01/28/24  0414 01/29/24  0807 01/30/24  0343   NA 138 140 139   K 3.9 3.6 3.7   CL 104 101 100   BUN 24* 32* 17   CREATININE 11.09* 13.22* 9.42*   GLU 305* 316* 281*   Estimated Creatinine Clearance: 11.5 mL/min (A) (based on SCr of 9.42 mg/dL (H)).     Recent Labs     01/28/24  0414 01/29/24  0807 01/30/24  0343   INR 1.14 1.11 1.25       Recent Labs     01/28/24  0414 01/29/24  0807 01/30/24  0343   PROT 6.1 7.0 6.5   ALBUMIN 2.7* 3.2* 3.0*   AST 14 14 14    ALT <  7* <7* <7*   ALKPHOS 225* 245* 254*   BILITOT 0.5 0.5 0.4   BILIDIR 0.30 0.30 0.20       Blood Cultures:  Lab Results   Component Value Date    LABBLOO No Growth at 4 days 01/26/2024    LABBLOO No Growth at 4 days 01/26/2024    LABBLOO No Growth at 5 days 01/16/2024    LABBLOO No Growth at 5 days 01/16/2024       Pertinent Imaging: Chest XR 01/26/24. Imaging was reviewed by Dr. Nat Knee.          [1]   Past Medical History:  Diagnosis Date    CHF (congestive heart failure)        COVID-19 07/18/2019    Diabetes mellitus        Family history of malignant neoplasm of prostate 06/23/2015    Heart disease     Heart murmur     Hepatic cirrhosis    06/23/2015    Overview:  Secondary to NASH; followed by Dr. Lindaann, GI.  Last Assessment & Plan:  Relevant Hx: Course: Daily Update: Today's Plan:     Hypertension     Hypogonadism in male 02/12/2014    Kidney stone     Liver cirrhosis secondary to NASH (nonalcoholic steatohepatitis)        s/p liver transplant 2013    Obesity    [2]   Past Surgical History:  Procedure Laterality Date    CHOLECYSTECTOMY      liver transpant      LIVER TRANSPLANTATION 06/27/2011    PR AV FIST REVISE GRFT,W THROMBECTOMY Right 01/15/2023    Procedure: REVISION, ARTERIOVENOUS FISTULA W/ THROMBECTOMY, AUTOGENOUS OR NONAUTOGENOUS DIALYSIS GRAFT (SEP. PROC), LOWER EXTREMITY;  Surgeon: Marchelle Kitchens, MD;  Location: [facility];  Service: General Surgery    PR COLONOSCOPY FLX DX W/COLLJ SPEC WHEN PFRMD N/A 01/24/2024    Procedure: COLONOSCOPY, FLEXIBLE, PROXIMAL TO SPLENIC FLEXURE; DIAGNOSTIC, W/WO COLLECTION SPECIMEN BY BRUSH OR WASH;  Surgeon: Erick Prentice HERO, MD;  Location: GI [facility]URES [facility];  Service: Gastroenterology    PR COLONOSCOPY W/BIOPSY SINGLE/MULTIPLE N/A 08/13/2018    Procedure: COLONOSCOPY, FLEXIBLE, PROXIMAL TO SPLENIC FLEXURE; WITH BIOPSY, SINGLE OR MULTIPLE;  Surgeon: Thedora Alm Plain, MD;  Location: GI [facility];  Service: Gastroenterology    PR COLSC FLX W/RMVL OF TUMOR POLYP LESION SNARE TQ N/A 08/13/2018    Procedure: COLONOSCOPY FLEX; W/REMOV TUMOR/LES BY SNARE;  Surgeon: Thedora Alm Plain, MD;  Location: GI [facility]URES [facility];  Service: Gastroenterology    PR CREAT AV FISTULA,NON-AUTOGENOUS GRAFT Right 11/29/2022    Procedure: CREATE AV FISTULA (SEPARATE PROC); NONAUTOGENOUS GRAFT (EG, BIOLOGICAL COLLAGEN, THERMOPLASTIC GRAFT), UPPER EXTREMITY;  Surgeon: Marchelle Kitchens, MD;  Location[name];  Service: General Surgery    PR UPPER GI ENDOSCOPY,BIOPSY N/A 07/13/2015    Procedure: UGI ENDOSCOPY; WITH BIOPSY, SINGLE OR MULTIPLE;  Surgeon: Elspeth Jerilynn Reek, MD;  Location: GI [facility];  Service: Gastroenterology    PR UPPER GI ENDOSCOPY,BIOPSY N/A 02/13/2023    Procedure: UGI ENDOSCOPY; WITH BIOPSY, SINGLE OR MULTIPLE;  Surgeon: Minnie Krystal Claude, MD;  Location: GI PROC[facility]URES [facility];  Service: Gastroenterology    PR UPPER GI ENDOSCOPY,DIAGNOSIS N/A 01/24/2024    Procedure: UGI ENDO, INCLUDE ESOPHAGUS, STOMACH, & DUODENUM &/OR JEJUNUM; DX W/WO COLLECTION SPECIMN, BY BRUSH OR WASH; Surgeon: Erick Prentice HERO, MD;  Location: GI [facility]URES [facility];  Service: Gastroenterology   [  3] No Known Allergies

## 2024-01-30 NOTE — Unmapped (Signed)
 [place] [facility]  01/30/2024     Background   58 y.o. male with with hypertension, T2DM, BMI 40, HFpEF, ESRD on HD (Tu/Th/Sat) who underwent liver transplant 02/2012 for [name] cirrhosis. Recently developed elevated liver biochemistries. Also developed encephalopathy, elevated ammonia, and has had imaging (CT 12/21/23) suggesting cirrhosis but unclear if he truly has cirrhosis.    Imaging reviewed: CT abdomen with and without contrast 01/02/2024    Findings  No evidence of splenorenal shunt.    Recommendations   Await MRI abdomen with elastography for fibrosis assessment.

## 2024-01-30 NOTE — Unmapped (Signed)
 Patient alert and oriented x4. VSS on RA. Refused BP med. ACHS. Up ad lib. Complaint of chest pain, provider notified and aware. EKG done. PRN atarax  given.     Shift Summary  Blood pressure decreased significantly from 154/82 to 115/64 during the shift.    Blood glucose was high at 222 mg/dL, and correctional insulin  was administered.    HydrOXYzine  was administered PRN to address comfort and wellbeing.    ECG indicated atrial fibrillation and possible infarcts, which were abnormal compared to previous results.    Overall, the patient remained stable with no growth in blood cultures and consistent monitoring of bleeding precautions.     Absence of Hospital-Acquired Illness or Injury: Blood cultures taken at 2:30 PM and 3:45 PM showed no growth at 4 days, and bleeding precautions were consistently monitored throughout the shift.     Optimal Comfort and Wellbeing: Blood pressure decreased from 154/82 to 115/64, and pain was consistently reported as 0 on the pain scale. HydrOXYzine  was administered PRN at 1:29 PM.     Optimal Coping: Patient was noted to be anxious at 9:30 AM, and psychosocial status had exceptions to within defined limits.     Tissue Perfusion: Peripheral vascular assessment showed exceptions to within defined limits, with generalized edema noted at 9:30 AM.

## 2024-01-30 NOTE — Unmapped (Signed)
 Current as of 01/30/24    [email or web address]-[email or web address]     Patient will have Dexcom 7 removed for his MRI exam

## 2024-01-31 LAB — HEPATIC FUNCTION PANEL
ALBUMIN: 2.9 g/dL — ABNORMAL LOW (ref 3.4–5.0)
ALKALINE PHOSPHATASE: 209 U/L — ABNORMAL HIGH (ref 46–116)
ALT (SGPT): <7 U/L — ABNORMAL LOW (ref 10–49)
AST (SGOT): 15 U/L (ref <=34)
BILIRUBIN DIRECT: 0.3 mg/dL (ref 0.00–0.30)
BILIRUBIN TOTAL: 0.5 mg/dL (ref 0.3–1.2)
PROTEIN TOTAL: 6.3 g/dL (ref 5.7–8.2)

## 2024-01-31 LAB — CBC
HEMATOCRIT: 33.9 % — ABNORMAL LOW (ref 39.0–48.0)
HEMOGLOBIN: 11.3 g/dL — ABNORMAL LOW (ref 12.9–16.5)
MEAN CORPUSCULAR HEMOGLOBIN CONC: 33.4 g/dL (ref 32.0–36.0)
MEAN CORPUSCULAR HEMOGLOBIN: 29 pg (ref 25.9–32.4)
MEAN CORPUSCULAR VOLUME: 87 fL (ref 77.6–95.7)
MEAN PLATELET VOLUME: 8.5 fL (ref 6.8–10.7)
PLATELET COUNT: 203 10*9/L (ref 150–450)
RED BLOOD CELL COUNT: 3.89 10*12/L — ABNORMAL LOW (ref 4.26–5.60)
RED CELL DISTRIBUTION WIDTH: 13.6 % (ref 12.2–15.2)
WBC ADJUSTED: 4.7 10*9/L (ref 3.6–11.2)

## 2024-01-31 LAB — BASIC METABOLIC PANEL
ANION GAP: 11 mmol/L (ref 5–14)
BLOOD UREA NITROGEN: 29 mg/dL — ABNORMAL HIGH (ref 9–23)
BUN / CREAT RATIO: 3
CALCIUM: 7.3 mg/dL — ABNORMAL LOW (ref 8.7–10.4)
CHLORIDE: 101 mmol/L (ref 98–107)
CO2: 26 mmol/L (ref 20.0–31.0)
CREATININE: 11.33 mg/dL — ABNORMAL HIGH (ref 0.73–1.18)
EGFR CKD-EPI (2021) MALE: 5 mL/min/1.73m2 — ABNORMAL LOW (ref >=60)
GLUCOSE RANDOM: 206 mg/dL — ABNORMAL HIGH (ref 70–179)
POTASSIUM: 3.4 mmol/L (ref 3.4–4.8)
SODIUM: 138 mmol/L (ref 135–145)

## 2024-01-31 LAB — PHOSPHORUS: PHOSPHORUS: 6.1 mg/dL — ABNORMAL HIGH (ref 2.4–5.1)

## 2024-01-31 LAB — PROTIME-INR
INR: 1.25
PROTIME: 14.2 s — ABNORMAL HIGH (ref 9.9–12.6)

## 2024-01-31 LAB — TACROLIMUS LEVEL, TROUGH: TACROLIMUS, TROUGH: 5.7 ng/mL (ref 5.0–15.0)

## 2024-01-31 MED ADMIN — sevelamer (RENVELA) tablet 1,600 mg: 1600 mg | ORAL | @ 19:00:00

## 2024-01-31 MED ADMIN — famotidine (PEPCID) tablet 10 mg: 10 mg | ORAL | @ 18:00:00

## 2024-01-31 MED ADMIN — calcitriol (ROCALTROL) capsule 0.5 mcg: .5 ug | ORAL | @ 15:00:00

## 2024-01-31 MED ADMIN — insulin lispro (HumaLOG) injection CORRECTIONAL 0-20 Units: 0-20 [IU] | SUBCUTANEOUS | @ 12:00:00

## 2024-01-31 MED ADMIN — lactulose (CEPHULAC) packet 20 g: 20 g | ORAL | @ 18:00:00

## 2024-01-31 MED ADMIN — mycophenolate (CELLCEPT) capsule 250 mg: 250 mg | ORAL | @ 02:00:00

## 2024-01-31 MED ADMIN — tacrolimus (PROGRAF) capsule 1 mg: 1 mg | ORAL | @ 02:00:00

## 2024-01-31 MED ADMIN — oxyCODONE (ROXICODONE) immediate release tablet 5 mg: 5 mg | ORAL | Stop: 2024-02-02

## 2024-01-31 MED ADMIN — gentamicin-sodium citrate lock solution in NS: 2.1 mL | @ 17:00:00

## 2024-01-31 MED ADMIN — insulin lispro (HumaLOG) injection CORRECTIONAL 0-20 Units: 0-20 [IU] | SUBCUTANEOUS | @ 22:00:00

## 2024-01-31 MED ADMIN — multivitamins, therapeutic with minerals tablet 1 tablet: 1 | ORAL | @ 18:00:00

## 2024-01-31 MED ADMIN — acetaminophen (TYLENOL) tablet 650 mg: 650 mg | ORAL | @ 23:00:00

## 2024-01-31 MED ADMIN — apixaban (ELIQUIS) tablet 5 mg: 5 mg | ORAL | @ 18:00:00

## 2024-01-31 MED ADMIN — apixaban (ELIQUIS) tablet 5 mg: 5 mg | ORAL | @ 02:00:00

## 2024-01-31 MED ADMIN — insulin lispro (HumaLOG) inj PERCENTAGE MEAL EATEN 10 Units: 10 [IU] | SUBCUTANEOUS

## 2024-01-31 MED ADMIN — sevelamer (RENVELA) tablet 1,600 mg: 1600 mg | ORAL | @ 02:00:00

## 2024-01-31 MED ADMIN — heparin (porcine) 1000 unit/mL injection 4,000 Units: 4000 [IU] | @ 13:00:00

## 2024-01-31 MED ADMIN — rifAXIMin (XIFAXAN) tablet 550 mg: 550 mg | ORAL | @ 02:00:00 | Stop: 2024-02-09

## 2024-01-31 MED ADMIN — tacrolimus (PROGRAF) capsule 1 mg: 1 mg | ORAL | @ 18:00:00

## 2024-01-31 MED ADMIN — insulin lispro (HumaLOG) injection CORRECTIONAL 0-20 Units: 0-20 [IU] | SUBCUTANEOUS | @ 02:00:00

## 2024-01-31 MED ADMIN — insulin lispro (HumaLOG) injection CORRECTIONAL 0-20 Units: 0-20 [IU] | SUBCUTANEOUS | @ 18:00:00

## 2024-01-31 MED ADMIN — sevelamer (RENVELA) tablet 1,600 mg: 1600 mg | ORAL

## 2024-01-31 MED ADMIN — insulin glargine (LANTUS) injection BASAL 30 Units: 30 [IU] | SUBCUTANEOUS | @ 02:00:00

## 2024-01-31 MED ADMIN — ursodiol (ACTIGALL) capsule 600 mg: 600 mg | ORAL | @ 02:00:00

## 2024-01-31 MED ADMIN — mycophenolate (CELLCEPT) capsule 250 mg: 250 mg | ORAL | @ 18:00:00

## 2024-01-31 MED ADMIN — epoetin alfa-EPBX (RETACRIT) injection 4,000 Units: 4000 [IU] | INTRAVENOUS | @ 13:00:00

## 2024-01-31 NOTE — Unmapped (Signed)
 VIR discussed patient with hepatology team in person. In short, patient has had intermittent hepatic encephalopathy with prominent gastroesophageal varices noted on CT. Patient underwent pressure measurements on  01/11/24, which demonstrated the following:    Pressure Measurements:  Free hepatic venous pressure: 16  Wedge hepatic venous pressure: 20  Right atrial pressure: 7  Hepatic venous pressure gradient: 4    VIR can consider splenic artery embolization in the future to potentially decrease portal venous flow.

## 2024-01-31 NOTE — Unmapped (Addendum)
 Patient alert and oriented. VSS on RA. Up ad lib around room. No complaints. Went down to HD.     Shift Summary  Hemodialysis was completed with a significant weight change and all related medications were administered in the DIALYSIS [facility] department.   Blood pressure and MAP were variable, and amlodipine  was refused in the afternoon.   No alarms were present and safety interventions, including aspiration precautions and non-skid footwear, were consistently maintained throughout the shift.   Psychosocial status was stable and the plan of care was reviewed with the patient.   Overall, the patient participated in care and remained alert and oriented during the shift.    Optimal Coping: Psychosocial status remained within defined limits throughout the shift, and the plan of care was reviewed with the patient in the afternoon.    Safe, Effective Therapy Delivery: Hemodialysis was completed with a treatment weight change of -4.01 kg, and all dialysis-related medications were administered in the DIALYSIS [facility] department as ordered; no alarms were present and safety precautions were maintained during the shift.    Effective Tissue Perfusion: Blood pressure and MAP fluctuated during the shift, with periods of elevated and then decreased values, and the patient refused amlodipine  in the afternoon; ECG showed atrial fibrillation and other chronic changes, and hemoglobin remained low on CBC.

## 2024-01-31 NOTE — Unmapped (Addendum)
 Adult Nutrition Assessment Note    Visit Type: RN Consult  Reason for Visit: Per Admission Nutrition Screen (Adult)    NUTRITION INTERVENTIONS and RECOMMENDATION     Encourage intake  Weigh weekly  Document PO intakes    NUTRITION ASSESSMENT     Current nutrition therapy is appropriate and progressing toward meeting nutritional needs at this time.   Patient would benefit from start of oral supplement to better meet nutritional needs.  Variable intakes recorded, 25-100% recorded. RN reports patient with decreased intake over the past few days due to patient dislike of food in facility. RN reports patient not interested in Nepro shake    NUTRITIONALLY RELEVANT DATA     HPI & PMH:   58 y.o. male whose presentation is complicated by cirrhosis status post liver transplant in 2013, type 2 diabetes, ESRD on HD (TTS) that presented to [facility] with rigors and chills     Nutrition History:   08/07- unable to obtain nutrition history as patient out of room receiving dialysis and no family at bedside.    Medications:  Nutritionally pertinent medications reviewed and evaluated for potential food and/or medication interactions.   Pepcid , Humalog , Lactulose , Multivitamin with minerals, Renvela   Labs:   Nutritionally pertinent labs reviewed and include Phosphorus: 6.1 mg/dL    Nutritional Needs:   Healthy balance of carbohydrate, protein, and fat.     Anthropometric Data:  Height: 177.8 cm (5' 10)   Admission weight: (!) 123.2 kg (271 lb 9.7 oz) (after dialysis)  Last recorded weight: (!) 123.2 kg (271 lb 9.7 oz) (after dialysis) (01/26/24)  IBW: 75.36 kg  BMI: Body mass index is 38.97 kg/m??.   Usual Body Weight: Unable to obtain at this time   Weight Assessment: no loss noted  Wt Readings from Last 20 Encounters:   01/26/24 (!) 123.2 kg (271 lb 9.7 oz)   01/24/24 (!) 122.5 kg (270 lb)   01/17/24 (!) 122.5 kg (270 lb)   01/14/24 (!) 122.8 kg (270 lb 12.8 oz)   01/11/24 (!) 117.9 kg (260 lb)   12/24/23 (!) 120.3 kg (265 lb 4.8 oz) 11/09/23 (!) 127 kg (280 lb)   10/01/23 (!) 123.4 kg (272 lb)   03/13/23 (!) 115.7 kg (255 lb 1.6 oz)   02/13/23 (!) 115.2 kg (254 lb)   02/09/23 (!) 115.2 kg (254 lb)   02/06/23 (!) 115.8 kg (255 lb 6.4 oz)   01/19/23 (!) 116.4 kg (256 lb 9.6 oz)   01/15/23 (!) 119.7 kg (264 lb)   12/22/22 (!) 122.9 kg (271 lb)   12/04/22 (!) 138.8 kg (306 lb)   11/17/22 (!) 135.4 kg (298 lb 8.1 oz)   11/13/22 (!) 131.5 kg (290 lb)   11/09/22 (!) 131.5 kg (290 lb)   11/09/22 (!) 131.8 kg (290 lb 8 oz)       Malnutrition Assessment:  Malnutrition assessment not yet completed at this time due to lack of nutrition history.    Nutrition Focused Physical Exam:  Unable to complete at this time due to patient availability     Care plan:  Ongoing, unable to diagnose malnutrition at this time    Current Nutrition:  Oral intake   Nutrition Orders            Nutrition Therapy Regular/House starting at 08/02 2010          Nutritionally Pertinent Allergies, Intolerances, Sensitivities, and/or Cultural/Religious Restrictions:  none identified per chart review at this time  GOALS and EVALUATION     Patient to meet 75% or greater of nutritional needs via combination of meals, snacks, and/or oral supplements within admission.  - New    Motivation, Barriers, and Compliance:  Evaluation of motivation, barriers, and compliance completed. No concerns identified at this time.     Discharge Planning:   Monitor for potential discharge needs with multi-disciplinary team.         Follow-Up Parameters:   1-2 times per week (and more frequent as indicated)    Arleta Blanch, RD, LDN

## 2024-01-31 NOTE — Unmapped (Signed)
 4 hours dialysis. Aim for 3.5L UF as tolerated.  Hd catheter accessed w/o problem.  Due meds given as prescribed.    Problem: Hemodialysis  Goal: Safe, Effective Therapy Delivery  Outcome: Ongoing - Unchanged  Goal: Effective Tissue Perfusion  Outcome: Ongoing - Unchanged  Goal: Absence of Infection Signs and Symptoms  Outcome: Ongoing - Unchanged

## 2024-01-31 NOTE — Unmapped (Signed)
 [name] Hemodialysis Procedure Note     01/31/2024    Richard [name] was seen and examined on hemodialysis    CHIEF COMPLAINT: End Stage Renal Disease    INTERVAL HISTORY: Power outage during dialysis today. Pt currently tolerating dialysis, no acute issues. Plan for VIR in coming days for catheter exchange. Currently running Qb 350 - best it has been but still suboptimal.     DIALYSIS TREATMENT DATA:  Estimated Dry Weight (kg): 120.5 kg (265 lb 10.5 oz) Patient Goal Weight (kg): 2.3 kg (5 lb 1.1 oz)   Pre-Treatment Weight (kg): 122.8 kg (270 lb 11.6 oz)    Dialysis Bath  Bath: 2 K+ / 2.5 Ca+  Dialysate Na (mEq/L): 137 mEq/L  Dialysate HCO3 (mEq/L): 35 mEq/L Dialyzer: F-180 (98 mLs)   Blood Flow Rate (mL/min): 350 mL/min Dialysis Flow (mL/min): 800 mL/min   Machine Temperature (C): 36.5 ??C (97.7 ??F)      PHYSICAL EXAM:  Vitals:  Temp:  [36.8 ??C (98.2 ??F)-36.9 ??C (98.5 ??F)] 36.9 ??C (98.5 ??F)  Pulse:  [61-87] 75  BP: (124-181)/(57-105) 124/57  MAP (mmHg):  [109-112] 112    General: fatigued, currently dialyzing in a Hemodialysis Recliner  Pulmonary: normal respiratory effort  Cardiovascular: wwp  Extremities: 1+  edema  Access: Left IJ tunneled catheter     LAB DATA:  Lab Results   Component Value Date    NA 138 01/31/2024    K 3.4 01/31/2024    CL 101 01/31/2024    CO2 26.0 01/31/2024    BUN 29 (H) 01/31/2024    CREATININE 11.33 (H) 01/31/2024    GLUCOSE 196 03/03/2012    CALCIUM  7.3 (L) 01/31/2024    MG 2.2 01/26/2024    PHOS 6.1 (H) 01/31/2024    ALBUMIN 2.9 (L) 01/31/2024      Lab Results   Component Value Date    HCT 33.9 (L) 01/31/2024    WBC 4.7 01/31/2024        ASSESSMENT/PLAN:  End Stage Renal Disease on Intermittent Hemodialysis:  UF goal: 3.5 L as tolerated - some edema, BP 120s/50s, crit line prof A at 2.5L and even at 3L.  Adjust medications for a GFR <10  Avoid nephrotoxic agents  Last HD Treatment:Started (01/31/24)     Bone Mineral Metabolism:  Lab Results   Component Value Date    CALCIUM  7.3 (L) 01/31/2024    CALCIUM  8.1 (L) 01/30/2024    Lab Results   Component Value Date    ALBUMIN 2.9 (L) 01/31/2024    ALBUMIN 3.0 (L) 01/30/2024      Lab Results   Component Value Date    PHOS 6.1 (H) 01/31/2024    PHOS 5.4 (H) 01/30/2024    Lab Results   Component Value Date    PTH 267.5 (H) 10/14/2021      Holding Sensipar 180  Adding Calcitriol  0.5mcg qtx    Anemia:   Lab Results   Component Value Date    HGB 11.3 (L) 01/31/2024    HGB 11.6 (L) 01/30/2024    HGB 12.1 (L) 01/29/2024    Iron Saturation (%)   Date Value Ref Range Status   11/09/2022 36 % Final   05/15/2012 39 20 - 50 % Final      Lab Results   Component Value Date    FERRITIN 169.8 11/09/2022       Anemia labs appropriate, no changes.    Vascular Access:  Running through [facility] currently,  plan for exchange tomorrow.   Blood Flow Rate (mL/min): 350 mL/min    IV Antibiotics to be administered at discharge:  TBD    This procedure was fully reviewed with the patient and/or their decision-maker. The risks, benefits, and alternatives were discussed prior to the procedure. All questions were answered and written informed consent was obtained.    Bernardino LELON Factor, MD  [facility] Division of Nephrology & Hypertension

## 2024-01-31 NOTE — Unmapped (Signed)
 HEMODIALYSIS NURSE PROCEDURE NOTE    Treatment Number:  2 Room/Station:  2 Procedure Date:  01/31/24   Total Treatment Time:  264 Min.    CONSENT:  Written consent was obtained prior to the procedure and is detailed in the medical record. Prior to the start of the procedure, a time out was taken and the identity of the patient was confirmed via name, medical record number and date of birth.     WEIGHTS:   Date/Time Pre-Treatment Weight (kg) Estimated Dry Weight (kg) Patient Goal Weight (kg) Total Goal Weight (kg)    01/31/24 0830 122.8 kg (270 lb 11.6 oz)  120.5 kg (265 lb 10.5 oz)  2.3 kg (5 lb 1.1 oz)  2.85 kg (6 lb 4.5 oz)          Date/Time Post-Treatment Weight (kg) Treatment Weight Change (kg)    01/31/24 1315 118.8 kg (261 lb 14.5 oz)  -4.01 kg      Active Dialysis Orders (168h ago, onward)       Start     Ordered    01/31/24 0942  Hemodialysis inpatient  Every Tue,Thu,Sat      Question Answer Comment   Patient HD Status: Chronic    New Start? No    K+ 2 meq/L    Ca++ 2.5 meq/L    Bicarb 35 meq/L    Na+ 137 meq/L    Na+ Modeling No    Dialyzer [id number]    Dialysate Temperature (C) 36.5    BFR-As tolerated to a maximum of: 400 mL/min    DFR 800 mL/min    Duration of treatment 4 Hr    Dry weight (kg) 120.5    Challenge dry weight (kg) no    Fluid removal (L) 3.5L 8/7    Tubing Adult = 142 ml    Access Site Dialysis Catheter    Access Site Location Left    Keep SBP >: 100        01/31/24 0941    01/29/24 0727  Hemodialysis inpatient  Every Tue,Thu,Sat,   Status:  Canceled      Question Answer Comment   Patient HD Status: Chronic    New Start? No    K+ 2 meq/L    Ca++ 2.5 meq/L    Bicarb 35 meq/L    Na+ 137 meq/L    Na+ Modeling No    Dialyzer [id number]    Dialysate Temperature (C) 36.5    BFR-As tolerated to a maximum of: 400 mL/min    DFR 800 mL/min    Duration of treatment 4 Hr    Dry weight (kg) 120.5    Challenge dry weight (kg) no    Fluid removal (L) to EDW    Tubing Adult = 142 ml    Access Site Dialysis Catheter    Access Site Location Left    Keep SBP >: 100        01/29/24 0726                  ACCESS SITE:       Hemodialysis Catheter 01/18/24 Left Internal jugular (Active)   Site Assessment Clean;Dry;Intact 01/31/24 1319   Proximal Lumen Status / Patency Gentamicin  Citrate Locked 01/31/24 1319   Proximal Lumen Intervention Deaccessed 01/31/24 1319   Medial Lumen Status / Patency Gentamicin  Citrate Locked 01/31/24 1319   Medial Lumen Intervention Deaccessed 01/31/24 1319   Dressing Intervention New dressing 01/31/24 1319   Dressing Status  Clean;Dry;Intact/not removed 01/31/24 1319   Verification by X-ray Yes 01/29/24 1336   Site Condition Bleeding 01/31/24 1319   Dressing Type CHG gel;Occlusive;Transparent 01/31/24 1319   Dressing Change Due 02/07/24 01/31/24 1319   Line Necessity Reviewed? Y 01/31/24 1319   Line Necessity Indications Yes - Hemodialysis 01/31/24 1319   Line Necessity Reviewed With MDW 01/30/24 0930        Arteriovenous Fistula - Vein Graft  Access 11/29/22 1216 Arteriovenous fistula Right Forearm (Active)   Site Assessment Clean;Dry;Intact 01/29/24 0115   AV Fistula Thrill Absent;Bruit Absent 01/30/24 2200   Status Deaccessed 01/29/24 0115   Dressing Intervention No intervention needed 01/29/24 0115   Dressing Status      No dressing 01/29/24 0115   Site Condition No complications 01/29/24 0115   Dressing Open to air (None) 01/30/24 2200   Dressing Drainage Description Other (Comment) 01/19/23 0201   Dressing Change Due 01/19/24 01/18/24 1730     Catheter Fill Volumes:  Arterial:  2.1 mL Venous:  2.1 mL   Catheter filled with 1 mg Gentamicin  Citrate post procedure.    Patient Lines/Drains/Airways Status       Active Peripheral & Central Intravenous Access       Name Placement date Placement time Site Days    Hemodialysis Catheter 01/18/24 Left Internal jugular 01/18/24  1634  Internal jugular  12                  LAB RESULTS:  Lab Results   Component Value Date    NA 138 01/31/2024    K 3.4 01/31/2024    CL 101 01/31/2024    CO2 26.0 01/31/2024    BUN 29 (H) 01/31/2024    CREATININE 11.33 (H) 01/31/2024    GLU 206 (H) 01/31/2024    GLUF 138 10/06/2014    CALCIUM  7.3 (L) 01/31/2024    CAION 4.57 11/20/2022    ICAV 4.88 03/04/2012    PHOS 6.1 (H) 01/31/2024    MG 2.2 01/26/2024    PTH 267.5 (H) 10/14/2021    IRON 95 11/09/2022    LABIRON 36 11/09/2022    TRANSFERRIN 212.0 (L) 11/09/2022    FERRITIN 169.8 11/09/2022    TIBC 267.1 11/09/2022     Lab Results   Component Value Date    WBC 4.7 01/31/2024    HGB 11.3 (L) 01/31/2024    HCT 33.9 (L) 01/31/2024    PLT 203 01/31/2024    PHART 7.36 03/04/2012    PO2ART 88 03/04/2012    PCO2ART 43 03/04/2012    HCO3ART 23.4 03/04/2012    BEART -1.3 03/04/2012    O2SATART 98.1 03/04/2012    PTINR : 05/06/2012    APTT 290.0 (HH) 01/19/2024        VITAL SIGNS:   Date/Time Temp Temp src       01/31/24 1300 36.8 ??C (98.2 ??F)  Axillary      01/31/24 0830 36.9 ??C (98.5 ??F)  Axillary        Date/Time Pulse BP MAP (mmHg) Patient Position    01/31/24 1300 72  161/86  --  Lying     01/31/24 1245 81  174/90  --  Lying     01/31/24 1215 66  143/82  --  Lying     01/31/24 1200 72  172/92  --  Lying     01/31/24 1130 80  180/96  --  Lying     01/31/24 1100 65  194/95  --  Lying     01/31/24 1045 71  166/78  --  Lying     01/31/24 1015 71  181/91  --  Lying     01/31/24 0945 59  159/84  --  Lying     01/31/24 0930 75  124/57  --  Lying     01/31/24 0900 61  181/105  --  Lying     01/31/24 0836 --  --  --  Lying     01/31/24 0830 68  170/81  --  Lying       Date/Time Blood Volume Change (%) HCT HGB Critline O2 SAT %    01/31/24 1300 -10.3 %  38.1  13  65.5     01/31/24 1245 -9.9 %  38  12.9  65     01/31/24 1215 -10.1 %  38  12.9  64.7     01/31/24 1200 -10.8 %  38.3  13  57.4     01/31/24 1130 -10 %  38  12.9  47.4     01/31/24 1100 -8.4 %  37.3  12.7  62.2     01/31/24 1045 -8.1 %  37.2  12.7  61     01/31/24 1015 -6.8 %  36.7  12.5  64.2     01/31/24 0945 -4.2 %  35.7  12.1  55.4 01/31/24 0930 -6.2 %  36.4  12.4  63.9       Date/Time Resp SpO2 O2 Device O2 Flow Rate (L/min)    01/31/24 1300 18  --  None (Room air)  --    01/31/24 1245 16  --  None (Room air)  --    01/31/24 1215 15  --  None (Room air)  --    01/31/24 1200 15  --  None (Room air)  --    01/31/24 1130 16  --  None (Room air)  --    01/31/24 1100 15  --  None (Room air)  --    01/31/24 1045 15  --  None (Room air)  --    01/31/24 1015 16  --  None (Room air)  --    01/31/24 0945 16  --  --  --    01/31/24 0930 18  --  None (Room air)  --    01/31/24 0900 18  --  None (Room air)  --    01/31/24 0836 --  --  None (Room air)  --    01/31/24 0830 18  --  None (Room air)  --     Oxygen Connected to Wall:  no     Date/Time Therapy Number Dialyzer All Research scientist (physical sciences) Detector Dialysis Flow (mL/min)    01/31/24 0830 2  F-180 (98 mLs)  Yes  Engaged  800 mL/min       Date/Time Verify Priming Solution Priming Volume Hemodialysis Independent pH Hemodialysis Machine Conductivity (mS/cm) Hemodialysis Independent Conductivity (mS/cm)    01/31/24 0830 0.9% NS  300 mL  --  13.9 mS/cm  13.8 mS/cm       Date/Time Bicarb Conductivity Residual Bleach Negative Free Chlorine Total Chlorine Chloramine    01/31/24 0830 -- Yes  -- 0  --      Date/Time Pre-Hemodialysis Comments    01/31/24 0830 alert, came by wheelchair       Date/Time Blood Flow Rate (mL/min) Arterial Pressure (mmHg) Venous Pressure (mmHg) Transmembrane Pressure (mmHg)    01/31/24 1300 350 mL/min  -210 mmHg  137 mmHg  21 mmHg     01/31/24 1245 350 mL/min  -212 mmHg  141 mmHg  41 mmHg     01/31/24 1215 350 mL/min  -209 mmHg  142 mmHg  48 mmHg     01/31/24 1200 350 mL/min  -214 mmHg  146 mmHg  42 mmHg     01/31/24 1130 350 mL/min  -212 mmHg  151 mmHg  45 mmHg     01/31/24 1100 350 mL/min  -208 mmHg  140 mmHg  54 mmHg     01/31/24 1045 350 mL/min  -202 mmHg  142 mmHg  55 mmHg     01/31/24 1015 350 mL/min  -206 mmHg  136 mmHg  57 mmHg     01/31/24 0945 350 mL/min  -192 mmHg 138 mmHg  57 mmHg     01/31/24 0930 350 mL/min  -204 mmHg  139 mmHg  47 mmHg     01/31/24 0900 350 mL/min  -187 mmHg  158 mmHg  26 mmHg     01/31/24 0836 150 mL/min  -40 mmHg  50 mmHg  40 mmHg       Date/Time Ultrafiltration Rate (mL/hr) Ultrafiltrate Removed (mL) Dialysate Flow Rate (mL/min) KECN (Kecn)    01/31/24 1300 300 mL/hr  4069 mL  800 ml/min  --     01/31/24 1245 1060 mL/hr  3856 mL  800 ml/min  --     01/31/24 1215 1060 mL/hr  3332 mL  500 ml/min  --     01/31/24 1200 1060 mL/hr  3174 mL  800 ml/min  --     01/31/24 1130 1060 mL/hr  2649 mL  800 ml/min  --     01/31/24 1100 1060 mL/hr  2123 mL  800 ml/min  --     01/31/24 1045 1060 mL/hr  1861 mL  800 ml/min  --     01/31/24 1015 1060 mL/hr  1335 mL  800 ml/min  --     01/31/24 0945 1060 mL/hr  812 mL  800 ml/min  --     01/31/24 0930 900 mL/hr  716 mL  800 ml/min  --     01/31/24 0900 700 mL/hr  323 mL  800 ml/min  --     01/31/24 0836 700 mL/hr  0 mL  800 ml/min  --       Date/Time Intra-Hemodialysis Comments    01/31/24 1300 tx completed.[email or web address]     01/31/24 1245 vs table. pt resting     01/31/24 1215 vs stable. pt resting     01/31/24 1200 pt stable     01/31/24 1130 pt stable     01/31/24 1100 pt stable.eating crackers     01/31/24 1045 vs stable. pt resting     01/31/24 1015 pt stable. vs stable     01/31/24 0945 dr [name] rounding, can increase UF to 3.5L per pt's request     01/31/24 0930 VSS pt. stable     01/31/24 0915 reported to Kim,NA for break     01/31/24 0900 pt stable. no complaints  voiced     01/31/24 0836 Hd initiated       Date/Time Rinseback Volume (mL) On Line Clearance: spKt/V Total Liters Processed (L/min) Dialyzer Clearance    01/31/24 1315 300 mL  --  81 L/min  Moderately streaked         Date/Time Post-Hemodialysis Comments    01/31/24 1315 alert. vs stable      POST TREATMENT  ASSESSMENT:  General appearance:  alert  Neurological:  Grossly normal  Lungs:  clear to auscultation bilaterally  Hearts:  regular rate and rhythm, S1, S2 normal, no murmur, click, rub or gallop  Abdomen:  soft, non-tender; bowel sounds normal; no masses,  no organomegaly  Pulses:    Skin:  Skin color, texture, turgor normal. No rashes or lesions     Date/Time Total Hemodialysis Replacement Volume (mL) Total Ultrafiltrate Output (mL)    01/31/24 1315 --  3500 mL      [id number] - Medicaitons Given During Treatment  (last 5 hrs)           Ifeanyi Mickelson ANITA S, RN         Medication Name Action Time Action Route Rate Dose User     calcitriol  (ROCALTROL ) capsule 0.5 mcg 01/31/24 1125 Given Oral  0.5 mcg Kirk Cathryne Donzell GORMAN, RN     epoetin  alfa-EPBX (RETACRIT ) injection 4,000 Units 01/31/24 9147 Given Intravenous  4,000 Units Kirk Cathryne Donzell GORMAN, RN     gentamicin -sodium citrate  lock solution in NS 01/31/24 1300 Given Intra-cannular  2.1 mL Kirk Cathryne Donzell GORMAN, RN     gentamicin -sodium citrate  lock solution in NS 01/31/24 1300 Given Intra-cannular  2.1 mL Kirk Cathryne Donzell GORMAN, RN     heparin  (porcine) 1000 unit/mL injection 4,000 Units 01/31/24 0848 Given Dialysis Circuit  4,000 Units Kirk, [place] Donzell GORMAN, RN            POLA PAO, RN         Medication Name Action Time Action Route Rate Dose User     insulin  lispro (HumaLOG ) inj PERCENTAGE MEAL EATEN 10 Units 01/31/24 1022 Not Given Subcutaneous  10 Units POLA PAO, RN     lactulose  (CEPHULAC ) packet 20 g 01/31/24 0900 Not Given Oral  20 g POLA PAO, RN                      Patient tolerated treatment in a  Dialysis Recliner.

## 2024-01-31 NOTE — Unmapped (Signed)
 Internal Medicine (MEDW) Progress Note    Assessment & Plan:   Richard [name] is a 58 y.o. 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 Tacrolimus , although is associated with myoclonic jerking, have pulled troughs too close together thus likely overestimating active amount of tacrolimus . Will continue current dose as per GI.  - tacrolimus  dosing per pharmacy, repeat appropriate timed trough  - limit opioid medications     DVT left internal jugular vein  Hx DVT in RLE  Acute DVT in left internal jugular vein including possible obstruction and proximal brachiocephalic vein.  Hypercoagulable ISO liver disease.  Patient with history of DVT of popliteal vein and right lower extremity for which patient takes anticoagulation.    - Continue home dose Eliquis  5 mg twice daily     Hx of Liver Transplant (2013) - Concern for graft dysfunction   Patient with hx of liver transplant in 2013. Admitted last week with HE, but after further evaluation suspect this was due to uremia. Liver biopsy on 7/15 without evidence of fibrosis, thus very unlikely to have portal pressure or shunt abnormalities. Being followed by Hepatology and will evaluate pt with MRI elastography on 8/6 which showed small liver, large caliber portosystemic shunt b/w dilated IVC and venous collaterals. Has a cholestatic pattern on LFTs with only Alk phos and GGT elevation, which is downtrending overall.  - Reached out to Hep for recs  - Cont. Home Tacrolimus  1mg  BID, dosing per pharmacy   - Cont. Home Mycophenolate  250mg  BID  - Cont. Home Ursodiol  600mg  BID  - Cont. Home Lactulose  20g TID (Titrate to 3-5 Bms daily)  - Cont. Home xifaxan  550mg  BID  - daily MELD score      ESRD on HD TTS  Per Nephro, patient's blood flow rate has been too low from Left tunneled line. Possibly due to blood clot noted above. Consulted VIR and hopefully pt will have line replaced on 8/8. His fistula is not usable and was going to meet with surgeon outpt appointment.  - Nephrology consulted, HD on usual schedule  - VIR consulted  - Cont. Home Renvela  1600mg  TID     Insulin -Dependent T2DM  Patient taking regimen of mounjaro , 60u of tresiba , and 35u of novolog  with meals at home. Pt received 30u last night and had sugars in 400s, adding mealtime of 10. Pt is eating less than half as much as normal, and calculated insulin  need from 8/5-8/6, will increase to 40u tonight.  -  Increasing Lantus  from 30u to 40u home dose tonight.  - Adding 10u mealtime coverage  - SSI  - Titrate insulin  as needed  - HOLD Glp1     HFpEF (EF >55%)  Patient with elevated pro-BNP to 17,137 on admission. Mild pulmonary vascular congestion seen on CXR however proBNP appears to be chronically elevated given previous ProBNP of ~14000 at last admission. Last echo w EF >55%.  - CTM     Chronic Problems  HTN: Cont. Home amlodipine   Chronic Anemia: Likely in setting of renal disease. Cont to monitor with daily cbc.    Daily Checklist:  Diet: Regular Diet  DVT PPx: Heparin  5000units q8h  Electrolytes: No Repletion Needed  Code Status: Full Code  Dispo: home pending clinical improvement    Team Contact Information:   Primary Team: Internal Medicine (MEDW)  Primary Resident: Dwain DELENA Chill, MD, MD  Resident's Pager: [phone] (Gen MedW Intern - Tower)    Interval History:     Patient reports improved muscle jerking sensation. Notes some occasional chest discomfort, thinks this is due to having so much going on. Reassured that EKG yesterday was similar to prior.    Objective:   Temp:  [36.7 ??C (98.1 ??F)-36.9 ??C (98.4 ??F)] 36.9 ??C (98.4 ??F)  Pulse:  [68-87] 87  Resp:  [18] 18  BP: (115-158)/(64-87) 158/87  SpO2:  [98 %-100 %] 100 %, No intake or output data in the 24 hours ending 01/31/24 0715      Gen: NAD, converses   HENT: atraumatic, normocephalic  Heart: RRR  Lungs: CTAB, no crackles or wheezes  Abdomen: obese, soft, NTND  Extremities: No edema; no erythema or warmth on upper extremities     Dwain Chill, M.D., M.T.S.  [facility] Internal Medicine, PGY-1

## 2024-01-31 NOTE — Unmapped (Addendum)
 Shift Summary  High panic glucose level was recorded, and insulin  glargine was administered to manage it.    On call Provider notified of high blood sugar.  Vital signs were stable, with temperature and SpO2 within normal limits.    Pain was effectively managed, with a reported level of 0 on the pain scale.    Overall, the patient's status was stable with no significant changes in infection indicators.     Absence of Infection Signs and Symptoms: Temperature remained stable at 36.9 ??C (98.4 ??F) throughout the shift, with no significant changes noted.     Optimal Pain Control and Function: Pain level was consistently reported as 0 on the 0-10 scale, indicating effective pain management.     Blood Glucose Level Within Target Range: Blood glucose level was recorded at 428 mg/dL, which is above the target range. Insulin  glargine was administered to address this.     Effective Tissue Perfusion: Vital signs, including blood pressure and SpO2, were within normal limits, suggesting effective tissue perfusion.

## 2024-01-31 NOTE — Unmapped (Signed)
 Hepatology Consult Service   Progress Note         Assessment and Recommendations:   Richard [name] is a 58 y.o. male with a PMHx of HTN, T2DM, HFpEF, [name] cirrhosis s/p OLT 2013 now c/f cirrhosis of transplanted liver, ESRD 2/2 CNI toxicity on HD (TTS) who presented to [facility] with rigors and chills. The patient is seen in consultation at the request of Powell Sydelle Caddy, MD ([facility] (MD[facility])) for history of liver transplant.     [name] cirrhosis s/p liver transplantation 2013  Concern for graft dysfunction   Has had recurrent admissions for hepatic encephalopathy.  There is ongoing concern of cirrhosis of his graft despite biopsy not being consistent with this.  Currently admitted with several days of rigors and confusion in the setting of running out of lactulose  with asterixis on exam. Developed encephalopathy and had elevated ammonia (though significant of it unclear). Given chills and rigors, was treated for infection of unknown source. Infectious studies unrevealing. No ascites noted. Liver bx 01/11/24 did not demonstrate fibrosis. MR elastography this stay did demonstrate did demonstrate stage 3 fibrosis.     - Continue lactulose , rifaximin   - Continue tacrolimus , CellCept , ursodiol   - Trend CBC, CMP, INR at least daily      Issues Impacting Complexity of Management:  -The patient has the need for intensive monitoring parameter(s) due to high-risk of clinical decline: frequent monitoring of MELD score to monitor for progression to/progression of acute or acute on chronic liver failure    Recommendations discussed with the patient's primary team. We will continue to follow along with you.    Subjective:   Patient feels alright overall. Denies f/c/n/v.         Objective:   Temp:  [36.5 ??C (97.7 ??F)-36.9 ??C (98.5 ??F)] 36.5 ??C (97.7 ??F)  Pulse:  [59-87] 65  Resp:  [15-18] 16  BP: (118-194)/(57-105) 150/91  SpO2:  [100 %] 100 %    Gen: Chronically ill-appearing male in NAD, answers questions appropriately  Eyes: Sclera anicteric  Abdomen: Normoactive bowel sounds, soft, NTND  Neuro: Normal speech.   Skin: No rashes, lesions on clothed exam  Psych: Alert, normal mood and affect.     Pertinent Labs & Studies:  -I have reviewed the patient's labs from 01/31/24 which show stable Hgb, stable LFTs, and stable INR     MR elastography:    IMPRESSION:   -- MR elastography examination with limited assessment secondary to above described factors. The results of this study should be assessed within these limitations.  -- Liver stiffness of 4.02 kPa consistent with Stage 3 fibrosis within limitations of the study.  -- Repeat assessment is recommended when the patient is able to hold the breath sufficiently for the study.

## 2024-02-01 LAB — BASIC METABOLIC PANEL
ANION GAP: 13 mmol/L (ref 5–14)
BLOOD UREA NITROGEN: 16 mg/dL (ref 9–23)
BUN / CREAT RATIO: 2
CALCIUM: 8.1 mg/dL — ABNORMAL LOW (ref 8.7–10.4)
CHLORIDE: 100 mmol/L (ref 98–107)
CO2: 26 mmol/L (ref 20.0–31.0)
CREATININE: 7.76 mg/dL — ABNORMAL HIGH (ref 0.73–1.18)
EGFR CKD-EPI (2021) MALE: 8 mL/min/1.73m2 — ABNORMAL LOW (ref >=60)
GLUCOSE RANDOM: 151 mg/dL (ref 70–179)
POTASSIUM: 3.6 mmol/L (ref 3.4–4.8)
SODIUM: 139 mmol/L (ref 135–145)

## 2024-02-01 LAB — HEPATIC FUNCTION PANEL
ALBUMIN: 3 g/dL — ABNORMAL LOW (ref 3.4–5.0)
ALKALINE PHOSPHATASE: 203 U/L — ABNORMAL HIGH (ref 46–116)
ALT (SGPT): <7 U/L — ABNORMAL LOW (ref 10–49)
AST (SGOT): 21 U/L (ref <=34)
BILIRUBIN DIRECT: 0.3 mg/dL (ref 0.00–0.30)
BILIRUBIN TOTAL: 0.5 mg/dL (ref 0.3–1.2)
PROTEIN TOTAL: 6.6 g/dL (ref 5.7–8.2)

## 2024-02-01 LAB — PROTIME-INR
INR: 1.29
PROTIME: 14.7 s — ABNORMAL HIGH (ref 9.9–12.6)

## 2024-02-01 LAB — PHOSPHORUS: PHOSPHORUS: 4.3 mg/dL (ref 2.4–5.1)

## 2024-02-01 LAB — HEPATITIS B SURFACE ANTIGEN: HEPATITIS B SURFACE ANTIGEN: NONREACTIVE

## 2024-02-01 LAB — TACROLIMUS LEVEL, TROUGH: TACROLIMUS, TROUGH: 6.4 ng/mL (ref 5.0–15.0)

## 2024-02-01 MED ADMIN — lactated ringers bolus 1,000 mL: 1000 mL | INTRAVENOUS | @ 20:00:00 | Stop: 2024-02-01

## 2024-02-01 MED ADMIN — Propofol (DIPRIVAN) injection: INTRAVENOUS | @ 13:00:00 | Stop: 2024-02-01

## 2024-02-01 MED ADMIN — apixaban (ELIQUIS) tablet 5 mg: 5 mg | ORAL | @ 02:00:00

## 2024-02-01 MED ADMIN — ceFAZolin (ANCEF) injection: INTRAVENOUS | @ 13:00:00 | Stop: 2024-02-01

## 2024-02-01 MED ADMIN — insulin lispro (HumaLOG) injection CORRECTIONAL 0-20 Units: 0-20 [IU] | SUBCUTANEOUS | @ 12:00:00

## 2024-02-01 MED ADMIN — apixaban (ELIQUIS) tablet 5 mg: 5 mg | ORAL | @ 19:00:00

## 2024-02-01 MED ADMIN — midazolam (VERSED) injection: INTRAVENOUS | @ 13:00:00 | Stop: 2024-02-01

## 2024-02-01 MED ADMIN — insulin glargine (LANTUS) injection BASAL 40 Units: 40 [IU] | SUBCUTANEOUS | @ 03:00:00

## 2024-02-01 MED ADMIN — ursodiol (ACTIGALL) capsule 600 mg: 600 mg | ORAL | @ 02:00:00

## 2024-02-01 MED ADMIN — heparin (porcine) 1000 unit/mL injection: INTRAVENOUS | @ 14:00:00 | Stop: 2024-02-01

## 2024-02-01 MED ADMIN — electrolyte-A (PLASMA-LYT A) infusion: INTRAVENOUS | @ 13:00:00 | Stop: 2024-02-01

## 2024-02-01 MED ADMIN — tacrolimus (PROGRAF) capsule 1 mg: 1 mg | ORAL | @ 19:00:00

## 2024-02-01 MED ADMIN — mycophenolate (CELLCEPT) capsule 250 mg: 250 mg | ORAL | @ 19:00:00

## 2024-02-01 MED ADMIN — dexmedeTOMIDine (Precedex) 400 mcg in sodium chloride 0.9% 100 ml (4 mcg/mL) infusion PMB: INTRAVENOUS | @ 13:00:00 | Stop: 2024-02-01

## 2024-02-01 MED ADMIN — ondansetron (ZOFRAN) injection: INTRAVENOUS | @ 14:00:00 | Stop: 2024-02-01

## 2024-02-01 MED ADMIN — insulin lispro (HumaLOG) injection CORRECTIONAL 0-20 Units: 0-20 [IU] | SUBCUTANEOUS | @ 03:00:00

## 2024-02-01 MED ADMIN — tacrolimus (PROGRAF) capsule 1 mg: 1 mg | ORAL | @ 02:00:00

## 2024-02-01 MED ADMIN — glycopyrrolate (ROBINUL) injection: INTRAVENOUS | @ 13:00:00 | Stop: 2024-02-01

## 2024-02-01 MED ADMIN — mycophenolate (CELLCEPT) capsule 250 mg: 250 mg | ORAL | @ 02:00:00

## 2024-02-01 MED ADMIN — rifAXIMin (XIFAXAN) tablet 550 mg: 550 mg | ORAL | @ 02:00:00 | Stop: 2024-02-09

## 2024-02-01 MED ADMIN — lidocaine (PF) (XYLOCAINE-MPF) 20 mg/mL (2 %) injection: INTRAVENOUS | @ 13:00:00 | Stop: 2024-02-01

## 2024-02-01 MED ADMIN — Propofol (DIPRIVAN) injection: INTRAVENOUS | @ 14:00:00 | Stop: 2024-02-01

## 2024-02-01 MED ADMIN — dexmedeTOMIDine (Precedex) 400 mcg in sodium chloride 0.9% 100 ml (4 mcg/mL) infusion PMB: INTRAVENOUS | @ 14:00:00 | Stop: 2024-02-01

## 2024-02-01 NOTE — Unmapped (Signed)
 VENOUS ACCESS ULTRASOUND PROCEDURE NOTE    Indications:   Poor venous access.    The Venous Access Team has assessed this patient for the placement of a PIV. Ultrasound guidance was necessary to obtain access.     Procedure Details:  Identity of the patient was confirmed via name, medical record number and date of birth. The availability of the correct equipment was verified.    The vein was identified for ultrasound catheter insertion.  Field was prepared with necessary supplies and equipment.  Probe cover and sterile gel utilized.  Insertion site was prepped with chlorhexidine solution and allowed to dry.  The catheter extension was primed with normal saline. A(n) 20 gauge 1.88 catheter was placed in the L Forearm with 2attempt(s). See LDA for additional details.    Catheter aspirated, 4 mL blood return present. The catheter was then flushed with 10 mL of normal saline. Insertion site cleansed, and dressing applied per manufacturer guidelines. The catheter was inserted with difficulty due to poor vasculature by Legrand FORBES Devon, RN.    Primary RN was notified.     Thank you,     Legrand FORBES Devon, RN Venous Access Team   [phone]     Workup / Procedure Time:  30 minutes    See vein image in PACs.

## 2024-02-01 NOTE — Unmapped (Signed)
 [facility] - Post-Operative Note     VIR Post-Procedure Note    Procedure Name: Tunneled hemodialysis line exchange     Pre-Op Diagnosis: ESRD    Post-Op Diagnosis: Same as pre-operative diagnosis    VIR Providers    Attending: Dr. Barton du Pisanie  Resident/Fellow/APP: Donnice Alexander, MD    Description of procedure:   Successful exchange of tunneled left internal jugular hemodialysis line, with advancement of the new catheter 3 cm.     Post-procedure follow-up: Catheter has been heparin  locked and is ready for use    Sedation: General anesthesia    Estimated Blood Loss: approximately 3 mL  Complications: None    See detailed procedure note with images in PACS.    The patient tolerated the procedure well without incident or complication and left the room in stable condition.    Donnice Alexander, MD  02/01/2024 10:30 AM

## 2024-02-01 NOTE — Unmapped (Signed)
 Hepatology Consult Service   Progress Note         Assessment and Recommendations:   Richard [name] is a 58 y.o. 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 The patient is seen in consultation at the request of Powell Sydelle Caddy, MD ([facility])) for history of liver transplant.     [name] cirrhosis s/p liver transplantation 2013  Concern for graft dysfunction   Has had recurrent admissions for hepatic encephalopathy.  There is ongoing concern of cirrhosis of his graft despite biopsy not being consistent with this.  Currently admitted with several days of rigors and confusion in the setting of running out of lactulose  with asterixis on exam. Developed encephalopathy and had elevated ammonia (though significant of it unclear). Given chills and rigors, was treated for infection of unknown source. Infectious studies unrevealing. No ascites noted. Liver bx 01/11/24 did not demonstrate fibrosis. MR elastography suggested stage 3 fibrosis but not reliable given breath-holding. HVPG of 4 on TJ pressures. Discussed with additional hepatologist and we will pursue outpatient workup. Additionally, patient has plan for splenic artery embolization given splenic artery pseudoaneurysm, which will also help decrease portal pressures.   - Outpatient follow up with hepatology and fibroscan, will arrange  - Continue lactulose , rifaximin   - Continue home tacrolimus  (1mg  BID), goal 2-4. MMF 250 BID. Attempt to get true tac troughs during hospitalization.   - Continue home urosdiol 600mg  BID  - Plan for embolization of splenic arteries on 8/25, will discuss with his outpatient hepatologist         Recommendations discussed with the patient's primary team. We will sign-off at this time, please re-contact if additional questions or a new need for consultation arises.      Subjective:   Patient feels alright overall. Denies f/c/n/v.       Objective:   Temp:  [36 ??C (96.8 ??F)-36.7 ??C (98.1 ??F)] 36.2 ??C (97.2 ??F)  Pulse:  [57-87] 60  SpO2 Pulse:  [62-77] 62  Resp:  [13-30] 20  BP: (125-177)/(74-144) 138/74  SpO2:  [92 %-100 %] 98 %    Gen: Chronically ill-appearing male in NAD, answers questions appropriately  Eyes: Sclera anicteric  Abdomen: Normoactive bowel sounds, soft, NTND  Neuro: Normal speech.   Skin: No rashes, lesions on clothed exam  Psych: Alert, normal mood and affect.     Pertinent Labs & Studies:  -I have reviewed the patient's labs from 02/01/24 which show stable LFTs

## 2024-02-01 NOTE — Unmapped (Signed)
 Nephrology ESKD Follow-up Consult Note    Assessment/Recommendations: Richard [name] is a 58 y.o. male with a past medical history notable for ESKD on in-center hemodialysis admitted with myoclo.     # ESKD:  - No indications for urgent/emergent HD today based upon volume status, electrolytes or acidemia. We will continue hemodialysis thrice weekly. Please see orders for most up to date prescription.  - Volume: Will attempt to achieve dry weight if tolerated  - Medication list currently appropriate  - Patient status post tunneled dialysis catheter exchange today with VIR.  Will utilize access tomorrow morning to ensure it is working well.   Current dialysis Rx:   EDW:120.5 kg (265 lb 10.5 oz)     Dialyzer:F-180 (98 mLs)   Bath:2 K+ / 2.5 Ca+  ;  Na:137 mEq/L  ;  CO2:35 mEq/L   Recent labs:  Na/K/Cl/CO2:139/3.6/100/26.0 (08/08 0419)    # Anemia of Chronic Kidney Disease:   - Will continue outpatient management with ESA and adjust as indicated. No current need for transfusion.  Lab Results   Component Value Date    HGB 11.3 (L) 01/31/2024    HGB 11.6 (L) 01/30/2024    HGB 12.1 (L) 01/29/2024     Lab Results   Component Value Date    IRON 95 11/09/2022    TIBC 267.1 11/09/2022    FERRITIN 169.8 11/09/2022         # Myoclonic jerking   - Reviewed primary team's daily progress note,   - Evaluation and management per primary team  - No changes to management from a nephrology standpoint at this time    Discussed recommendations with primary team.  Thanks for this consult, we will continue to follow along and provide recommendations as needed.    *Please contact Nephrology PRIOR to hospital discharge so we can assist the primary team and ensure appropriate dialysis related follow-up (ie scheduling, antibiotics, changes in EDW and dialysis access, etc.).DEWAINE Carron Blanch, DO  02/01/2024 3:08 PM   Medical decision-making for 02/01/24  Findings / Data     Patient has: []  acute illness w/systemic sxs  [mod]  []  two or more stable chronic illnesses [mod]  []  one chronic illness with acute exacerbation [mod]  []  acute complicated illness  [mod]  []  Undiagnosed new problem with uncertain prognosis  [mod] [x]  illness posing risk to life or bodily function (ex. AKI)  [high]  []  chronic illness with severe exacerbation/progression  [high]  []  chronic illness with severe side effects of treatment  [high] ESKD on RRT Probs At least 2:  Probs, Data, Risk   I reviewed: [x]  primary team note  []  consultant note(s)  []  external records [x]  chemistry results  [x]  CBC results  []  blood gas results  []  Other []  procedure/op note(s)   []  radiology report(s)  []  micro result(s)  []  w/ independent historian(s) Hb addressed above, No hyperK req RRT, per primary  >=3 Data Review (2 of 3)    I independently interpreted: []  Urine Sediment  []  Renal US  []  CXR Images  []  CT Images  []  Other []  EKG Tracing  Any     I discussed: []  Pathology results w/ QHPs(s) from other specialties  []  Procedural findings w/ QHPs(s) from other specialties []  Imaging w/ QHP(s) from other specialties  [x]  Treatment plan w/ QHP(s) from other specialties Plan discussed with primary team Any     Mgm't requires: []  Prescription drug(s)  [mod]  []   Kidney biopsy  [mod]  []  Central line placement  [mod] []  High risk medication use and/or intensive toxicity monitoring [high]  [x]  Renal replacement therapy [high]  []  High risk kidney biopsy  [high]  []  Escalation of care  [high]  []  High risk central line placement  [high] RRT: High risk of complications from RRT requiring intensive monitoring Risk      _____________________________________________________________________________________    Subjective/Interval Events:  Patient seen postop, reports he currently feels sleepy and does not want any dialysis today as he is too tired.    Physical Exam:  Vitals:    02/01/24 1112 02/01/24 1126 02/01/24 1200 02/01/24 1230   BP: 150/90 148/87 138/74    Pulse: 68 60 60    Resp: 30 30 20     Temp: 36.2 ??C (97.2 ??F)    TempSrc:   Oral    SpO2: 94% 93% 99% 98%   Weight:       Height:         I/O this shift:  In: 377.1 [I.V.:377.1]  Out: 2 [Blood:2]    Intake/Output Summary (Last 24 hours) at 02/01/2024 1508  Last data filed at 02/01/2024 1036  Gross per 24 hour   Intake 377.08 ml   Output 2 ml   Net 375.08 ml     General: ill-appearing, no acute distress  CV: RRR, no m/r/g.  Lungs: normal work of breathing  Ext: no edema

## 2024-02-01 NOTE — Unmapped (Signed)
 Internal Medicine (MEDW) Progress Note    Assessment & Plan:   CAILEAN HEACOCK is a 58 y.o. male whose presentation is complicated by [name] cirrhosis s/p transplant (02/2012), insulin  dependent T2DM, BMI 40, HFpE, ESRD due to CNI toxicity and hypertension (biopsy 05/2019) on iHD (TTS), dyslipidemia that presented to [facility] with altered mental status.     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  Hypercoagulable ISO liver disease.  Patient with history of DVT of popliteal vein and right lower extremity for which patient takes anticoagulation.    - Continue home dose Eliquis  5 mg twice daily     Hx of Liver Transplant (2013) - Concern for graft dysfunction   Patient with hx of liver transplant in 2013. Admitted last week with HE, but after further evaluation suspect this was due to uremia. Liver biopsy on 7/15 without evidence of fibrosis, thus very unlikely to have portal pressure or shunt abnormalities. Being followed by Hepatology and will evaluate pt with MRI elastography on 8/6 which showed small liver, large caliber portosystemic shunt b/w dilated IVC and venous collaterals. Per hepatology, pt will be considered for outpt splenic artery embolization  - Cont. Home Tacrolimus  1mg  BID, dosing per pharmacy   - Cont. Home Mycophenolate  250mg  BID  - Cont. Home Ursodiol  600mg  BID  - Cont. Home Lactulose  20g TID (Titrate to 3-5 Bms daily)  - Cont. Home xifaxan  550mg  BID  - daily MELD score      ESRD on HD TTS  Pt received tunneled line exchange with IR on 8/8 (prior had low blood flow rates). His fistula is not usable and was going to meet with surgeon outpt appointment. Feeling groggy after general anesthesia, does not want HD trial this afternoon. Will have early morning HD session on 8/9.  - Nephrology consulted, HD on usual schedule  - Cont. Home Renvela  1600mg  TID     Insulin -Dependent T2DM  Patient taking regimen of mounjaro , 60u of tresiba , and 35u of novolog  with meals at home. Pt is eating less than half as much as normal, and calculated insulin  need.  - Lantus  40u dose tonight.  - 10u mealtime coverage  - SSI  - Titrate insulin  as needed  - HOLD Glp1     HFpEF (EF >55%)  Patient with elevated pro-BNP to 17,137 on admission. Mild pulmonary vascular congestion seen on CXR however proBNP appears to be chronically elevated given previous ProBNP of ~14000 at last admission. Last echo w EF >55%.  - CTM     Chronic Problems  HTN: Cont. Home amlodipine   Chronic Anemia: Likely in setting of renal disease. Cont to monitor with daily cbc.    Daily Checklist:  Diet: Regular  Diet  DVT PPx: Heparin  5000units q8h  Electrolytes: No Repletion Needed  Code Status: Full Code  Dispo: home pending clinical improvement    Team Contact Information:   Primary Team: Internal Medicine (MED[facility])  Primary Resident: Dwain DELENA Chill, MD, MD  Resident's Pager: [phone] (Gen [facility] Intern - Tower)    Interval History:     Patient reports procedure went well, though he is feeling groggy. Wants to sleep and not be disturbed.    Objective:   Temp:  [36 ??C (96.8 ??F)-36.7 ??C (98.1 ??F)] 36.2 ??C (97.2 ??F)  Pulse:  [57-87] 60  SpO2 Pulse:  [62-77] 62  Resp:  [13-30] 20  BP: (125-177)/(74-144) 138/74  SpO2:  [92 %-100 %] 98 %,   Intake/Output Summary (Last 24 hours) at 02/01/2024 1539  Last data filed at 02/01/2024 1036  Gross per 24 hour   Intake 377.08 ml   Output 2 ml   Net 375.08 ml         Gen: NAD, converses   HENT: atraumatic, normocephalic  Heart: RRR  Lungs: CTAB, no crackles or wheezes  Abdomen: obese, soft, NTND  Extremities: No edema; no erythema or warmth on upper extremities     Dwain Chill, M.D., M.T.S.  [facility] Internal Medicine, PGY-1

## 2024-02-01 NOTE — Unmapped (Addendum)
 Patient alert and oriented x4 this shift. Refused afternoon vitals. Came back from procedure, drowsy but easy to arouse. Did not want to be bothered. Refused most meds, only took tac, cellcept  and eliquis . Achs. IV bolus given. Did not want to order any food.    Shift Summary  Left-sided dual lumen palindrome hemodialysis catheter was exchanged and advanced, with immediate use planned.    Sedation and anesthesia medications were administered in the procedural recovery and PACU departments for a procedure.    Blood glucose levels decreased over the shift, with correctional insulin  given in the morning and not required in the afternoon.    Pain remained well controlled, and psychosocial status was stable.    Overall, the shift was marked by stable vital signs, successful catheter exchange, and no acute complications.     Optimal Comfort and Wellbeing: Pain scores remained at 0 throughout the shift after initial sedation, and no pain interventions were required; chills were not documented during the shift.     Optimal Coping: Psychosocial status was consistently within defined limits, and the plan of care was reviewed with the patient.     Tissue Perfusion: Blood pressure and MAP trended downward but remained within a manageable range, and SpO2 improved to 99% by midday; a new hemodialysis catheter was exchanged and deemed ready for immediate use.

## 2024-02-01 NOTE — Unmapped (Signed)
 Shift Summary  Insulin  lispro and glargine were administered to address elevated blood glucose while NPO.  OxyCODONE  was given for abdominal pain, and patient was later observed sleeping, suggesting some comfort post-intervention.  Peripheral IV site was assessed, flushed, and a new dressing was applied, with the site remaining clean and intact.  Infection prevention protocols were followed, and no new safety concerns were documented during the shift.  Patient maintained overall stability with no new hospital-acquired complications noted during the shift.    Absence of Hospital-Acquired Illness or Injury: No new hospital-acquired issues were documented; infection prevention measures were performed and peripheral IV site was clean, dry, and received a new dressing during the shift.    Optimal Pain Control and Function: Abdominal pain was reported as aching and intermittent with a score of 7, and oxyCODONE  was administered PRN; patient was later noted to be sleeping, suggesting some relief after intervention.    Blood Glucose Level Within Target Range: Blood glucose remained elevated at 208 mg/dL, and both insulin  lispro and insulin  glargine were administered per orders; patient was NPO and received a calculated NPO insulin  dose during the shift.

## 2024-02-02 LAB — BASIC METABOLIC PANEL
ANION GAP: 11 mmol/L (ref 5–14)
BLOOD UREA NITROGEN: 24 mg/dL — ABNORMAL HIGH (ref 9–23)
BUN / CREAT RATIO: 2
CALCIUM: 7.2 mg/dL — ABNORMAL LOW (ref 8.7–10.4)
CHLORIDE: 102 mmol/L (ref 98–107)
CO2: 26 mmol/L (ref 20.0–31.0)
CREATININE: 9.9 mg/dL — ABNORMAL HIGH (ref 0.73–1.18)
EGFR CKD-EPI (2021) MALE: 6 mL/min/1.73m2 — ABNORMAL LOW (ref >=60)
GLUCOSE RANDOM: 130 mg/dL (ref 70–179)
POTASSIUM: 4.2 mmol/L (ref 3.4–4.8)
SODIUM: 139 mmol/L (ref 135–145)

## 2024-02-02 LAB — PROTIME-INR
INR: 1.34
PROTIME: 15.3 s — ABNORMAL HIGH (ref 9.9–12.6)

## 2024-02-02 LAB — HEPATIC FUNCTION PANEL
ALBUMIN: 2.8 g/dL — ABNORMAL LOW (ref 3.4–5.0)
ALKALINE PHOSPHATASE: 168 U/L — ABNORMAL HIGH (ref 46–116)
ALT (SGPT): <7 U/L — ABNORMAL LOW (ref 10–49)
AST (SGOT): 25 U/L (ref <=34)
BILIRUBIN DIRECT: 0.3 mg/dL (ref 0.00–0.30)
BILIRUBIN TOTAL: 0.5 mg/dL (ref 0.3–1.2)
PROTEIN TOTAL: 6.5 g/dL (ref 5.7–8.2)

## 2024-02-02 LAB — PHOSPHORUS: PHOSPHORUS: 5.1 mg/dL (ref 2.4–5.1)

## 2024-02-02 LAB — IONIZED CALCIUM VENOUS: CALCIUM IONIZED VENOUS (MG/DL): 3.67 mg/dL — ABNORMAL LOW (ref 4.40–5.40)

## 2024-02-02 LAB — TACROLIMUS LEVEL, TROUGH: TACROLIMUS, TROUGH: 5.2 ng/mL (ref 5.0–15.0)

## 2024-02-02 MED ADMIN — apixaban (ELIQUIS) tablet 5 mg: 5 mg | ORAL | @ 01:00:00

## 2024-02-02 MED ADMIN — multivitamins, therapeutic with minerals tablet 1 tablet: 1 | ORAL | @ 17:00:00

## 2024-02-02 MED ADMIN — insulin lispro (HumaLOG) inj PERCENTAGE MEAL EATEN 10 Units: 10 [IU] | SUBCUTANEOUS | @ 17:00:00

## 2024-02-02 MED ADMIN — famotidine (PEPCID) tablet 10 mg: 10 mg | ORAL | @ 17:00:00

## 2024-02-02 MED ADMIN — HYDROmorphone (PF) (DILAUDID) injection 0.5 mg: .5 mg | INTRAVENOUS | @ 15:00:00 | Stop: 2024-02-02

## 2024-02-02 MED ADMIN — ursodiol (ACTIGALL) capsule 600 mg: 600 mg | ORAL | @ 17:00:00

## 2024-02-02 MED ADMIN — apixaban (ELIQUIS) tablet 5 mg: 5 mg | ORAL | @ 17:00:00

## 2024-02-02 MED ADMIN — lactulose (CEPHULAC) packet 20 g: 20 g | ORAL | @ 01:00:00

## 2024-02-02 MED ADMIN — ursodiol (ACTIGALL) capsule 600 mg: 600 mg | ORAL | @ 01:00:00

## 2024-02-02 MED ADMIN — gentamicin-sodium citrate lock solution in NS: 2.1 mL | @ 17:00:00

## 2024-02-02 MED ADMIN — rifAXIMin (XIFAXAN) tablet 550 mg: 550 mg | ORAL | @ 01:00:00 | Stop: 2024-02-09

## 2024-02-02 MED ADMIN — rifAXIMin (XIFAXAN) tablet 550 mg: 550 mg | ORAL | @ 17:00:00 | Stop: 2024-02-09

## 2024-02-02 MED ADMIN — insulin glargine (LANTUS) injection BASAL 40 Units: 40 [IU] | SUBCUTANEOUS | @ 01:00:00

## 2024-02-02 MED ADMIN — lactulose (CEPHULAC) packet 20 g: 20 g | ORAL | @ 17:00:00

## 2024-02-02 MED ADMIN — heparin (porcine) 1000 unit/mL injection 4,000 Units: 4000 [IU] | @ 14:00:00

## 2024-02-02 MED ADMIN — oxyCODONE (ROXICODONE) immediate release tablet 5 mg: 5 mg | ORAL | @ 12:00:00 | Stop: 2024-02-02

## 2024-02-02 MED ADMIN — mycophenolate (CELLCEPT) capsule 250 mg: 250 mg | ORAL | @ 17:00:00

## 2024-02-02 MED ADMIN — oxyCODONE (ROXICODONE) immediate release tablet 5 mg: 5 mg | ORAL | @ 01:00:00 | Stop: 2024-02-02

## 2024-02-02 MED ADMIN — epoetin alfa-EPBX (RETACRIT) injection 4,000 Units: 4000 [IU] | INTRAVENOUS | @ 15:00:00

## 2024-02-02 MED ADMIN — tacrolimus (PROGRAF) capsule 1 mg: 1 mg | ORAL | @ 17:00:00

## 2024-02-02 MED ADMIN — amlodipine (NORVASC) tablet 10 mg: 10 mg | ORAL | @ 17:00:00

## 2024-02-02 MED ADMIN — mycophenolate (CELLCEPT) capsule 250 mg: 250 mg | ORAL | @ 01:00:00

## 2024-02-02 MED ADMIN — insulin lispro (HumaLOG) injection CORRECTIONAL 0-20 Units: 0-20 [IU] | SUBCUTANEOUS | @ 01:00:00

## 2024-02-02 MED ADMIN — sevelamer (RENVELA) tablet 1,600 mg: 1600 mg | ORAL | @ 17:00:00

## 2024-02-02 MED ADMIN — tacrolimus (PROGRAF) capsule 1 mg: 1 mg | ORAL | @ 01:00:00

## 2024-02-02 NOTE — Unmapped (Signed)
 Tacrolimus  Therapeutic Monitoring Pharmacy Note    Richard [name] is a 58 y.o. male continuing tacrolimus .     Indication: Liver transplant     Date of Transplant: 02/2012      Prior Dosing Information: Home regimen tacrolimus  1 mg BID      Source(s) of information used to determine prior to admission dosing: Home Medication List, Fill HIstory, or MAR    Goals:  Therapeutic Drug Levels  Tacrolimus  trough goal: 2-4 ng/mL    Additional Clinical Monitoring/Outcomes  Monitor renal function (SCr and urine output) and liver function (LFTs)  Monitor for signs/symptoms of adverse events (e.g., hyperglycemia, hyperkalemia, hypomagnesemia, hypertension, headache, tremor)    Previous Lab Values  Tacrolimus , Trough   Date/Time Value Ref Range Status   02/02/2024 08:34 AM 5.2 5.0 - 15.0 ng/mL Final   02/01/2024 04:19 AM 6.4 5.0 - 15.0 ng/mL Final   01/31/2024 05:26 AM 5.7 5.0 - 15.0 ng/mL Final   01/30/2024 03:43 AM 5.7 5.0 - 15.0 ng/mL Final   01/29/2024 08:07 AM 5.3 5.0 - 15.0 ng/mL Final   10/06/2014 09:00 AM 6.8 <=20.0 ng/mL Final   08/31/2014 08:34 AM 4.2  Final   08/03/2014 08:22 AM 4.7  Final   07/07/2014 08:37 AM 4.2  Final   06/09/2014 08:27 AM 3.5  Final       Result:  Tacrolimus  level from 8/9 was drawn appropriately     Pharmacokinetic Considerations and Significant Drug Interactions:  Concurrent CYP3A4 substrates/inhibitors: None identified    Assessment/Plan:  Recommendedation(s)  Continue current regimen of tacrolimus  1 mg BID per hepatologyand given improving LFTs.    Follow-up  Next level should be ordered on 02/03/24 at 0700.   A pharmacist will continue to monitor and recommend levels as appropriate    Please page service pharmacist with questions/clarifications.    Richard [name], PharmD

## 2024-02-02 NOTE — Unmapped (Signed)
 Internal Medicine (MEDW) Progress Note    Assessment & Plan:   Richard [name] is a 58 y.o. male whose presentation is complicated by [name] cirrhosis s/p transplant (02/2012), insulin  dependent T2DM, BMI 40, HFpE, ESRD due to CNI toxicity and hypertension (biopsy 05/2019) on iHD (TTS), dyslipidemia that presented to [facility] with altered mental status.     Principal Problem:    Chills  Active Problems:    Type 2 diabetes mellitus with hyperglycemia, with long-term current use of insulin        Liver replaced by transplant       Class 3 obesity    Immunosuppression due to drug therapy    ESRD (end stage renal disease) on dialysis       Type 2 MI (myocardial infarction)       (HFpEF) heart failure with preserved ejection fraction       Elevated liver enzymes    Hyperphosphatemia    Left arm swelling    Rigors      Active Problems    DVT left internal jugular vein  Hx DVT in RLE  Acute DVT in left internal jugular vein including possible obstruction and proximal brachiocephalic vein.  Hypercoagulable ISO liver disease.  Patient with history of DVT of popliteal vein and right lower extremity for which patient takes anticoagulation.    - Continue home dose Eliquis  5 mg twice daily     Hx of Liver Transplant (2013) - Concern for graft dysfunction   Patient with hx of liver transplant in 2013. Admitted last week with HE, but after further evaluation suspect this was due to uremia. Liver biopsy on 7/15 without evidence of fibrosis, thus very unlikely to have portal pressure or shunt abnormalities. Being followed by Hepatology evaluated pt with MRI elastography on 8/6 due to concern for cirrhosis of transplanted liver which showed small liver, large caliber portosystemic shunt b/w dilated IVC and venous collaterals. Per hepatology, pt will be considered for outpt splenic artery embolization for shunt seen.   - Cont. Home Tacrolimus  1mg  BID, dosing per pharmacy   - Cont. Home Mycophenolate  250mg  BID  - Cont. Home Ursodiol  600mg  BID  - Cont. Home Lactulose  20g TID (Titrate to 3-5 Bms daily)  - Cont. Home xifaxan  550mg  BID  - daily MELD score      ESRD on HD TTS  Pt received tunneled line exchange with IR on 8/8 (prior had low blood flow rates). His fistula is not usable and was going to meet with surgeon outpt appointment. Feeling groggy after general anesthesia, does not want HD trial this afternoon. Will have early morning HD session on 8/9.  - Nephrology consulted, HD on usual schedule  - Cont. Home Renvela  1600mg  TID     Insulin -Dependent T2DM  Patient taking regimen of mounjaro , 60u of tresiba , and 35u of novolog  with meals at home. Pt is eating less than half as much as normal, and calculated insulin  need.  - Lantus  40u dose nightly  - 10u mealtime coverage  - SSI  - Titrate insulin  as needed  - HOLD Glp1     HFpEF (EF >55%)  Patient with elevated pro-BNP to 17,137 on admission. Mild pulmonary vascular congestion seen on CXR however proBNP appears to be chronically elevated given previous ProBNP of ~14000 at last admission. Last echo w EF >55%.  - CTM     Chronic Problems  HTN: Cont. Home amlodipine   Chronic Anemia: Likely in setting of renal disease. Cont to monitor with daily  cbc.    Daily Checklist:  Diet: Regular Diet  DVT PPx: Heparin  5000units q8h  Electrolytes: No Repletion Needed  Code Status: Full Code  Dispo: home pending clinical improvement    Team Contact Information:   Primary Team: Internal Medicine (MEDW)  Primary Resident: Belvie Ferries, MD, MD  Resident's Pager: [phone] (Gen MedW Intern - Tower)    Interval History:     Reports feeling tired from HD. He is medically ready for discharge but wishes to be discharged tomorrow due to family coming from out of town to help him with transporation and self-care after discharge.     Objective:   Temp:  [36.7 ??C (98.1 ??F)-37.1 ??C (98.8 ??F)] 37 ??C (98.6 ??F)  Pulse:  [56-79] 73  Resp:  [18-20] 18  BP: (124-180)/(62-99) 138/83  SpO2:  [97 %-100 %] 97 %,   Intake/Output Summary (Last 24 hours) at 02/02/2024 1711  Last data filed at 02/02/2024 1245  Gross per 24 hour   Intake --   Output 1000 ml   Net -1000 ml         Gen: NAD, converses   HENT: atraumatic, normocephalic  Heart: RRR  Lungs: CTAB, no crackles or wheezes  Abdomen: obese, soft, NTND  Extremities: No edema; no erythema or warmth on upper extremities

## 2024-02-02 NOTE — Unmapped (Signed)
 [facility] Nephrology Hemodialysis Procedure Note     02/02/2024    Richard [name] was seen and examined on hemodialysis    CHIEF COMPLAINT: End Stage Renal Disease    INTERVAL HISTORY: Pt currently tolerating dialysis, no acute issues. IR catheter exchange 8/8. Has significant pain at the site of the new catheter (states 20 out of 10) - primary team notified.  he is only 0.3 kg above EDW, so we are mildly challenging the EDW    DIALYSIS TREATMENT DATA:  Estimated Dry Weight (kg): 120.5 kg (265 lb 10.5 oz) Patient Goal Weight (kg): 1 kg (2 lb 3.3 oz)   Pre-Treatment Weight (kg): 120.8 kg (266 lb 5.1 oz)    Dialysis Bath  Bath: 2 K+ / 2.5 Ca+  Dialysate Na (mEq/L): 137 mEq/L  Dialysate HCO3 (mEq/L): 35 mEq/L Dialyzer: F-180 (98 mLs)   Blood Flow Rate (mL/min): 400 mL/min Dialysis Flow (mL/min): 800 mL/min   Machine Temperature (C): 36.5 ??C (97.7 ??F)      PHYSICAL EXAM:  Vitals:  Temp:  [36 ??C (96.8 ??F)-37.1 ??C (98.8 ??F)] 36.7 ??C (98.1 ??F)  Pulse:  [56-87] 66  SpO2 Pulse:  [62-77] 62  BP: (138-167)/(74-144) 151/82  MAP (mmHg):  [92-103] 103    General: in no acute distress, currently dialyzing in a Hemodialysis Recliner  Pulmonary: normal respiratory effort  Cardiovascular: wwp  Extremities: no significant  edema  Access: Left IJ tunneled catheter     LAB DATA:  Lab Results   Component Value Date    NA 139 02/02/2024    K 4.2 02/02/2024    CL 102 02/02/2024    CO2 26.0 02/02/2024    BUN 24 (H) 02/02/2024    CREATININE 9.90 (H) 02/02/2024    GLUCOSE 196 03/03/2012    CALCIUM  7.2 (L) 02/02/2024    MG 2.2 01/26/2024    PHOS 5.1 02/02/2024    ALBUMIN 2.8 (L) 02/02/2024      Lab Results   Component Value Date    HCT 33.9 (L) 01/31/2024    WBC 4.7 01/31/2024        ASSESSMENT/PLAN:  End Stage Renal Disease on Intermittent Hemodialysis:  UF goal: 1 L as tolerated - he is only 0.3 kg above EDW, so we are mildly challenging the EDW  Adjust medications for a GFR <10  Avoid nephrotoxic agents  Last HD Treatment:Started (02/02/24)     Bone Mineral Metabolism:  Lab Results   Component Value Date    CALCIUM  7.2 (L) 02/02/2024    CALCIUM  8.1 (L) 02/01/2024    Lab Results   Component Value Date    ALBUMIN 2.8 (L) 02/02/2024    ALBUMIN 3.0 (L) 02/01/2024      Lab Results   Component Value Date    PHOS 5.1 02/02/2024    PHOS 4.3 02/01/2024    Lab Results   Component Value Date    PTH 267.5 (H) 10/14/2021      Holding Sensipar 180  Adding Calcitriol  0.37mcg qtx    Anemia:   Lab Results   Component Value Date    HGB 11.3 (L) 01/31/2024    HGB 11.6 (L) 01/30/2024    HGB 12.1 (L) 01/29/2024    Iron Saturation (%)   Date Value Ref Range Status   11/09/2022 36 % Final   05/15/2012 39 20 - 50 % Final      Lab Results   Component Value Date    FERRITIN 169.8 11/09/2022  Anemia labs appropriate, no changes.    Vascular Access:  Running through [facility] currently, plan for exchange tomorrow.   Blood Flow Rate (mL/min): 400 mL/min    IV Antibiotics to be administered at discharge:  TBD    This procedure was fully reviewed with the patient and/or their decision-maker. The risks, benefits, and alternatives were discussed prior to the procedure. All questions were answered and written informed consent was obtained.    Almarie GORMAN Christine, MD  [facility] Division of Nephrology & Hypertension

## 2024-02-02 NOTE — Unmapped (Signed)
 Treatment time is 4hrs. UF set at [employer]. New H catheter placed yesterday. Lines reversed.    Problem: Hemodialysis  Goal: Safe, Effective Therapy Delivery  Outcome: Ongoing - Unchanged

## 2024-02-02 NOTE — Unmapped (Signed)
 Patient admitted for Chills. Patient remained alert oreinted X 4 but little drowsy after coming from procedure. Patient complaine of pain PRN oxycodone  given with good effect. No further complained voiced. NO PIV MD aware. Blood glucose checked and insulin  coverage provided as per orders. Call bell and side table with reach. Left I JHD cath in placed.   Shift Summary  PRN oxyCODONE  was given for acute pain, resulting in a significant reduction in pain score.    Blood glucose was managed with correctional insulin , with improvement noted by the end of the shift.    Safety and infection prevention protocols were maintained, and no new injuries or complications were documented.    Patient was observed sleeping comfortably after pain management.    Overall, the shift was stable with effective symptom management and no new hospital-acquired issues.     Absence of Hospital-Acquired Illness or Injury: No new hospital-acquired injuries were documented, and safety interventions such as fall reduction and environmental modifications were consistently maintained throughout the shift; peripheral IV site remained clean, dry, and intact, and cohorting was utilized for infection prevention.     Optimal Comfort and Wellbeing: Acute pain in the back and hand was reported as constant and aching at the start of the shift, but pain score decreased from 8 to 0 after PRN oxyCODONE  administration, and patient was later observed sleeping.     Readiness for Transition of Care: Unplanned readmission score remained stable with only minimal increase, and blood glucose improved from 189 mg/dL to 874 mg/dL during the shift.

## 2024-02-02 NOTE — Unmapped (Signed)
 HEMODIALYSIS NURSE PROCEDURE NOTE    Treatment Number:  3 Room/Station:  2 Procedure Date:  02/02/24   Total Treatment Time:  240 Min.    CONSENT:  Written consent was obtained prior to the procedure and is detailed in the medical record. Prior to the start of the procedure, a time out was taken and the identity of the patient was confirmed via name, medical record number and date of birth.     WEIGHTS:   Date/Time Pre-Treatment Weight (kg) Estimated Dry Weight (kg) Patient Goal Weight (kg) Total Goal Weight (kg)    02/02/24 0800 120.8 kg (266 lb 5.1 oz)  120.5 kg (265 lb 10.5 oz)  1 kg (2 lb 3.3 oz)  1.55 kg (3 lb 6.7 oz)          Date/Time Post-Treatment Weight (kg) Treatment Weight Change (kg)    02/02/24 1245 119.3 kg (263 lb 0.1 oz)  -1.5 kg      Active Dialysis Orders (168h ago, onward)       Start     Ordered    02/02/24 0700  Hemodialysis inpatient  Every Tue,Thu,Sat      Question Answer Comment   Patient HD Status: Chronic    New Start? No    K+ 2 meq/L    Ca++ 2.5 meq/L    Bicarb 35 meq/L    Na+ 137 meq/L    Na+ Modeling No    Dialyzer [id number]    Dialysate Temperature (C) 36.5    BFR-As tolerated to a maximum of: 400 mL/min    DFR 800 mL/min    Duration of treatment 4 Hr    Dry weight (kg) 120.5    Challenge dry weight (kg) no    Fluid removal (L) 3.5L 8/7    Tubing Adult = 142 ml    Access Site Dialysis Catheter    Access Site Location Left    Keep SBP >: 100        02/01/24 1509    01/29/24 0727  Hemodialysis inpatient  Every Tue,Thu,Sat,   Status:  Canceled      Question Answer Comment   Patient HD Status: Chronic    New Start? No    K+ 2 meq/L    Ca++ 2.5 meq/L    Bicarb 35 meq/L    Na+ 137 meq/L    Na+ Modeling No    Dialyzer [id number]    Dialysate Temperature (C) 36.5    BFR-As tolerated to a maximum of: 400 mL/min    DFR 800 mL/min    Duration of treatment 4 Hr    Dry weight (kg) 120.5    Challenge dry weight (kg) no    Fluid removal (L) to EDW    Tubing Adult = 142 ml    Access Site Dialysis Catheter Access Site Location Left    Keep SBP >: 100        01/29/24 0726                  ACCESS SITE:       Hemodialysis Catheter 02/01/24 Venovenous catheter Left Internal jugular (Active)   Site Assessment Clean;Dry;Intact 02/02/24 1239   Proximal Lumen Status / Patency Blood Return - Brisk;Capped 02/02/24 1239   Proximal Lumen Intervention Deaccessed 02/02/24 1239   Medial Lumen Status / Patency Blood Return - Brisk;Capped 02/02/24 1239   Medial Lumen Intervention Deaccessed 02/02/24 1239   Dressing Intervention No intervention needed 02/02/24 1239   Dressing Status  Clean;Dry;Intact/not removed 02/02/24 1239   Verification by X-ray Yes 02/02/24 1239   Site Condition No complications 02/02/24 1239   Dressing Type CHG gel 02/02/24 1239   Dressing Change Due 02/08/24 02/02/24 1239   Line Necessity Reviewed? Y 02/02/24 1239   Line Necessity Indications Yes - Hemodialysis 02/02/24 1239   Line Necessity Reviewed With MW 02/02/24 1239        Arteriovenous Fistula - Vein Graft  Access 11/29/22 1216 Arteriovenous fistula Right Forearm (Active)   Site Assessment Clean;Dry;Intact 02/01/24 2000   AV Fistula Thrill Absent;Bruit Absent 02/01/24 2000   Status Deaccessed 02/01/24 2000   Dressing Intervention No intervention needed 01/29/24 0115   Dressing Status      No dressing 01/29/24 0115   Site Condition No complications 01/29/24 0115   Dressing Open to air (None) 01/31/24 2230   Dressing Drainage Description Other (Comment) 01/19/23 0201   Dressing Change Due 01/19/24 01/18/24 1730     Catheter Fill Volumes:  Arterial:  2.1 mL Venous:  2.1 mL   Catheter filled with 4% mg Gentamicin  Citrate post procedure.    Patient Lines/Drains/Airways Status       Active Peripheral & Central Intravenous Access       Name Placement date Placement time Site Days    Peripheral IV 02/01/24 Anterior;Left Forearm 02/01/24  0204  Forearm  1    Hemodialysis Catheter 02/01/24 Venovenous catheter Left Internal jugular 02/01/24  1008  Internal jugular  1                  LAB RESULTS:  Lab Results   Component Value Date    NA 139 02/02/2024    K 4.2 02/02/2024    CL 102 02/02/2024    CO2 26.0 02/02/2024    BUN 24 (H) 02/02/2024    CREATININE 9.90 (H) 02/02/2024    GLU 130 02/02/2024    GLUF 138 10/06/2014    CALCIUM  7.2 (L) 02/02/2024    CAION 3.67 (L) 02/02/2024    ICAV 4.88 03/04/2012    PHOS 5.1 02/02/2024    MG 2.2 01/26/2024    PTH 267.5 (H) 10/14/2021    IRON 95 11/09/2022    LABIRON 36 11/09/2022    TRANSFERRIN 212.0 (L) 11/09/2022    FERRITIN 169.8 11/09/2022    TIBC 267.1 11/09/2022     Lab Results   Component Value Date    WBC 4.7 01/31/2024    HGB 11.3 (L) 01/31/2024    HCT 33.9 (L) 01/31/2024    PLT 203 01/31/2024    PHART 7.36 03/04/2012    PO2ART 88 03/04/2012    PCO2ART 43 03/04/2012    HCO3ART 23.4 03/04/2012    BEART -1.3 03/04/2012    O2SATART 98.1 03/04/2012    PTINR : 05/06/2012    APTT 290.0 (HH) 01/19/2024        VITAL SIGNS:   Date/Time Temp Temp src       02/02/24 1237 36.9 ??C (98.4 ??F)  Oral        Date/Time Pulse BP MAP (mmHg) Patient Position    02/02/24 1240 70  180/80  122  Lying     02/02/24 1237 71  171/92  --  Lying     02/02/24 1230 66  164/99  --  Lying     02/02/24 1200 72  172/66  --  Lying     02/02/24 1130 76  128/62  --  Lying     02/02/24 1100  71  154/76  --  Lying     02/02/24 1049 79  142/64  --  --     02/02/24 1030 61  124/68  --  Lying     02/02/24 1000 63  166/88  --  Lying     02/02/24 0930 66  151/82  --  Lying     02/02/24 0915 62  140/87  --  Lying       Date/Time Blood Volume Change (%) HCT HGB Critline O2 SAT %    02/02/24 1237 -5.5 %  35.7  12.1  58.7     02/02/24 1230 -5.4 %  35.6  12.1  58.8     02/02/24 1200 -4.9 %  35.4  12.1  63.3     02/02/24 1130 -4.6 %  35.3  12  60.4     02/02/24 1100 -3.4 %  34.9  11.9  58.5     02/02/24 1049 -3.1 %  34.8  11.8  58.3     02/02/24 1042 -3 %  34.8  11.8  64.5     02/02/24 1030 -2.9 %  34.7  11.8  67.3     02/02/24 1000 -3.8 %  35  11.9  52.8     02/02/24 0930 -2.1 %  34.4  11.7  48.1     02/02/24 0915 -1.9 %  34.3  11.7  58.3       Date/Time Resp SpO2 O2 Device FiO2 (%) O2 Flow Rate (L/min)    02/02/24 1240 18  --  --  -- --     02/02/24 1237 18  --  None (Room air)  -- --     02/02/24 1230 18  --  --  -- --     02/02/24 1200 18  --  None (Room air)  -- --     02/02/24 1130 18  --  None (Room air)  -- --     02/02/24 1100 18  --  None (Room air)  -- --     02/02/24 1030 18  --  None (Room air)  -- --     02/02/24 1000 18  --  None (Room air)  -- --     02/02/24 0930 18  --  --  -- --     02/02/24 0915 18  --  None (Room air)  -- --      Oxygen Connected to Wall:  no     Date/Time Therapy Number Dialyzer All Research scientist (physical sciences) Detector Dialysis Flow (mL/min)    02/02/24 0800 3  F-180 (98 mLs)  Yes  Engaged  800 mL/min       Date/Time Verify Priming Solution Priming Volume Hemodialysis Independent pH Hemodialysis Machine Conductivity (mS/cm) Hemodialysis Independent Conductivity (mS/cm)    02/02/24 0800 0.9% NS  300 mL  --  13.7 mS/cm  13.8 mS/cm       Date/Time Bicarb Conductivity Residual Bleach Negative Free Chlorine Total Chlorine Chloramine    02/02/24 0800 -- Yes  -- 0  --      Date/Time Pre-Hemodialysis Comments    02/02/24 0800 on dialysis recliner       Date/Time Blood Flow Rate (mL/min) Arterial Pressure (mmHg) Venous Pressure (mmHg) Transmembrane Pressure (mmHg)    02/02/24 1237 0 mL/min  -14 mmHg  31 mmHg  -2 mmHg     02/02/24 1230 400 mL/min  -225 mmHg  159 mmHg  34 mmHg  02/02/24 1200 400 mL/min  -220 mmHg  157 mmHg  35 mmHg     02/02/24 1130 400 mL/min  -218 mmHg  157 mmHg  36 mmHg     02/02/24 1100 400 mL/min  -217 mmHg  156 mmHg  36 mmHg     02/02/24 1049 400 mL/min  -218 mmHg  156 mmHg  34 mmHg     02/02/24 1042 400 mL/min  -217 mmHg  156 mmHg  35 mmHg     02/02/24 1030 400 mL/min  -219 mmHg  155 mmHg  36 mmHg     02/02/24 1000 400 mL/min  -222 mmHg  156 mmHg  34 mmHg     02/02/24 0930 400 mL/min  -215 mmHg  161 mmHg  37 mmHg     02/02/24 0915 400 mL/min  -207 mmHg  159 mmHg  35 mmHg     02/02/24 0845 300 mL/min  -134 mmHg  112 mmHg  43 mmHg     02/02/24 0837 300 mL/min  -130 mmHg  120 mmHg  40 mmHg       Date/Time Ultrafiltration Rate (mL/hr) Ultrafiltrate Removed (mL) Dialysate Flow Rate (mL/min) KECN (Kecn)    02/02/24 1237 0 mL/hr  1550 mL  0 ml/min  --     02/02/24 1230 450 mL/hr  1482 mL  800 ml/min  --     02/02/24 1200 450 mL/hr  1259 mL  800 ml/min  --     02/02/24 1130 380 mL/hr  1100 mL  800 ml/min  --     02/02/24 1100 390 mL/hr  913 mL  800 ml/min  --     02/02/24 1049 390 mL/hr  842 mL  800 ml/min  --     02/02/24 1042 390 mL/hr  797 mL  800 ml/min  --     02/02/24 1030 390 mL/hr  721 mL  800 ml/min  --     02/02/24 1000 390 mL/hr  528 mL  800 ml/min  --     02/02/24 0930 390 mL/hr  335 mL  800 ml/min  --     02/02/24 0915 390 mL/hr  238 mL  800 ml/min  --     02/02/24 0845 390 mL/hr  45 mL  800 ml/min  --     02/02/24 0837 390 mL/hr  0 mL  800 ml/min  --       Date/Time Intra-Hemodialysis Comments    02/02/24 1237 Time completed.Rinseback. No c/o.     02/02/24 1230 VSS.     02/02/24 1200 Stable.     02/02/24 1130 Hepatology team at bedside.No c/o.     02/02/24 1100 Pt is alert. No c/o.     02/02/24 1049 Pt feels better at verbalized.     02/02/24 1042 ue pain med given     02/02/24 1030 Pt is quiet.Calm.Eyes closed.     02/02/24 1000 Pt still with pain at the new exit site.dr Kotzen at bedisde.Informed primary team.     02/02/24 0930 Stable.     02/02/24 0915 No c/o.     02/02/24 0845 Stable     02/02/24 0837 H started.Used H catheter.Lines reversed.       Date/Time Rinseback Volume (mL) On Line Clearance: spKt/V Total Liters Processed (L/min) Dialyzer Clearance    02/02/24 1245 300 mL  --  n/a  83.3 L/min  Lightly streaked         Date/Time Post-Hemodialysis Comments    02/02/24 1245  Stable      POST TREATMENT ASSESSMENT:  General appearance:  alert  Neurological:  Mental status: Alert, oriented, thought content appropriate  Lungs: -  Hearts:  normal apical impulse  Abdomen:  normal findings: bowel sounds normal  Pulses:  -  Skin:  normal     Date/Time Total Hemodialysis Replacement Volume (mL) Total Ultrafiltrate Output (mL)    02/02/24 1245 --  1000 mL      [id number] - Medicaitons Given During Treatment  (last 4 hrs)           Jillianna Stanek N, RN         Medication Name Action Time Action Route Rate Dose User     HYDROmorphone  (PF) (DILAUDID ) injection 0.5 mg 02/02/24 1042 Given Intravenous  0.5 mg Alandra Sando N, RN     epoetin  alfa-EPBX (RETACRIT ) injection 4,000 Units 02/02/24 1119 Given Intravenous  4,000 Units Carrick Rijos N, RN     gentamicin -sodium citrate  lock solution in NS 02/02/24 1230 Given Intra-cannular  2.1 mL Veona Bittman N, RN     gentamicin -sodium citrate  lock solution in NS 02/02/24 1230 Given Intra-cannular  2.1 mL Harlie Buening N, RN     heparin  (porcine) 1000 unit/mL injection 4,000 Units 02/02/24 0932 Given Dialysis Circuit  4,000 Units Meggie Laseter N, RN                      Patient tolerated treatment in a  Dialysis Recliner.

## 2024-02-02 NOTE — Unmapped (Signed)
 Shift Summary  Pain at the new hemodialysis catheter site was managed with oxyCODONE  and HYDROmorphone , resulting in decreased pain scores during the shift.   Gentamicin -sodium citrate  was administered for hemodialysis catheter care in [facility] [place].   Bruising and pale skin were noted on assessment, but no new injuries were documented.   Fall prevention and infection control interventions were maintained throughout the shift.   Overall, vital signs and pain were managed, and safety interventions were consistently implemented.     Absence of Hospital-Acquired Illness or Injury: No falls occurred and fall prevention interventions were maintained throughout the shift, with environmental safety measures and bed safety in place; bruising was noted on skin assessment but no new injuries documented.     Optimal Coping: Psychosocial status remained within defined limits during the shift.     Optimal Functional Ability: Mobility was not limited and occasional ambulation was documented, but bedrest was maintained for most of the shift.     Absence of Infection Signs and Symptoms: Temperature remained stable and within normal range, aseptic technique was maintained, and infection prevention measures were implemented; gentamicin -sodium citrate  was administered for hemodialysis catheter care.     Skin Health and Integrity: Bruising was present and skin was noted to be pale, but moisture was rarely present and no apparent friction or shear problems were documented.

## 2024-02-03 LAB — PROTIME-INR
INR: 1.63
PROTIME: 18.6 s — ABNORMAL HIGH (ref 9.9–12.6)

## 2024-02-03 LAB — BASIC METABOLIC PANEL
ANION GAP: 11 mmol/L (ref 5–14)
BLOOD UREA NITROGEN: 15 mg/dL (ref 9–23)
BUN / CREAT RATIO: 2
CALCIUM: 7.8 mg/dL — ABNORMAL LOW (ref 8.7–10.4)
CHLORIDE: 102 mmol/L (ref 98–107)
CO2: 26 mmol/L (ref 20.0–31.0)
CREATININE: 6.57 mg/dL — ABNORMAL HIGH (ref 0.73–1.18)
EGFR CKD-EPI (2021) MALE: 9 mL/min/1.73m2 — ABNORMAL LOW (ref >=60)
GLUCOSE RANDOM: 135 mg/dL (ref 70–179)
POTASSIUM: 3.6 mmol/L (ref 3.4–4.8)
SODIUM: 139 mmol/L (ref 135–145)

## 2024-02-03 LAB — HEPATIC FUNCTION PANEL
ALBUMIN: 2.9 g/dL — ABNORMAL LOW (ref 3.4–5.0)
ALKALINE PHOSPHATASE: 165 U/L — ABNORMAL HIGH (ref 46–116)
ALT (SGPT): <7 U/L — ABNORMAL LOW (ref 10–49)
AST (SGOT): 20 U/L (ref <=34)
BILIRUBIN DIRECT: 0.3 mg/dL (ref 0.00–0.30)
BILIRUBIN TOTAL: 0.6 mg/dL (ref 0.3–1.2)
PROTEIN TOTAL: 6.4 g/dL (ref 5.7–8.2)

## 2024-02-03 LAB — PHOSPHORUS: PHOSPHORUS: 3.7 mg/dL (ref 2.4–5.1)

## 2024-02-03 LAB — TACROLIMUS LEVEL, TROUGH: TACROLIMUS, TROUGH: 6.2 ng/mL (ref 5.0–15.0)

## 2024-02-03 MED ORDER — HYDROXYZINE HCL 25 MG TABLET
ORAL_TABLET | Freq: Four times a day (QID) | ORAL | 0 refills | 8.00000 days | Status: CP | PRN
Start: 2024-02-03 — End: 2024-02-03
  Filled 2024-03-11: qty 30, 8d supply, fill #0

## 2024-02-03 MED ORDER — INSULIN GLARGINE (U-100) 100 UNIT/ML SUBCUTANEOUS SOLUTION
Freq: Every evening | SUBCUTANEOUS | 2 refills | 30.00000 days | Status: CN
Start: 2024-02-03 — End: 2024-05-03

## 2024-02-03 MED ORDER — MELATONIN 3 MG TABLET
ORAL_TABLET | Freq: Every evening | ORAL | 0 refills | 30.00000 days | Status: CN | PRN
Start: 2024-02-03 — End: ?

## 2024-02-03 MED ORDER — INSULIN DEGLUDEC (U-200) 200 UNIT/ML (3 ML) SUBCUTANEOUS PEN
Freq: Every day | SUBCUTANEOUS | 2 refills | 30.00000 days | Status: CN
Start: 2024-02-03 — End: 2024-05-03

## 2024-02-03 MED ORDER — LACTULOSE 20 GRAM ORAL PACKET
PACK | Freq: Three times a day (TID) | ORAL | 2 refills | 30.00000 days | Status: CP
Start: 2024-02-03 — End: 2024-05-03

## 2024-02-03 MED ADMIN — multivitamins, therapeutic with minerals tablet 1 tablet: 1 | ORAL | @ 13:00:00 | Stop: 2024-02-03

## 2024-02-03 MED ADMIN — apixaban (ELIQUIS) tablet 5 mg: 5 mg | ORAL | @ 13:00:00 | Stop: 2024-02-03

## 2024-02-03 MED ADMIN — rifAXIMin (XIFAXAN) tablet 550 mg: 550 mg | ORAL | Stop: 2024-02-09

## 2024-02-03 MED ADMIN — insulin glargine (LANTUS) injection BASAL 40 Units: 40 [IU] | SUBCUTANEOUS | @ 01:00:00

## 2024-02-03 MED ADMIN — ursodiol (ACTIGALL) capsule 600 mg: 600 mg | ORAL

## 2024-02-03 MED ADMIN — rifAXIMin (XIFAXAN) tablet 550 mg: 550 mg | ORAL | @ 13:00:00 | Stop: 2024-02-03

## 2024-02-03 MED ADMIN — tacrolimus (PROGRAF) capsule 1 mg: 1 mg | ORAL

## 2024-02-03 MED ADMIN — mycophenolate (CELLCEPT) capsule 250 mg: 250 mg | ORAL | @ 13:00:00 | Stop: 2024-02-03

## 2024-02-03 MED ADMIN — tacrolimus (PROGRAF) capsule 1 mg: 1 mg | ORAL | @ 13:00:00 | Stop: 2024-02-03

## 2024-02-03 MED ADMIN — apixaban (ELIQUIS) tablet 5 mg: 5 mg | ORAL

## 2024-02-03 MED ADMIN — famotidine (PEPCID) tablet 10 mg: 10 mg | ORAL | @ 13:00:00 | Stop: 2024-02-03

## 2024-02-03 MED ADMIN — mycophenolate (CELLCEPT) capsule 250 mg: 250 mg | ORAL

## 2024-02-03 MED ADMIN — ondansetron (ZOFRAN-ODT) disintegrating tablet 4 mg: 4 mg | ORAL

## 2024-02-03 MED ADMIN — amlodipine (NORVASC) tablet 10 mg: 10 mg | ORAL | @ 13:00:00 | Stop: 2024-02-03

## 2024-02-03 MED ADMIN — lactulose (CEPHULAC) packet 20 g: 20 g | ORAL

## 2024-02-03 MED ADMIN — insulin lispro (HumaLOG) injection CORRECTIONAL 0-20 Units: 0-20 [IU] | SUBCUTANEOUS | @ 01:00:00

## 2024-02-03 MED ADMIN — ursodiol (ACTIGALL) capsule 600 mg: 600 mg | ORAL | @ 13:00:00 | Stop: 2024-02-03

## 2024-02-03 NOTE — Unmapped (Signed)
 Tacrolimus  Therapeutic Monitoring Pharmacy Note    Richard [name] is a 58 y.o. male continuing tacrolimus .     Indication: Liver transplant     Date of Transplant: 02/2012      Prior Dosing Information: Home regimen tacrolimus  1 mg BID      Source(s) of information used to determine prior to admission dosing: Home Medication List, Fill HIstory, or MAR    Goals:  Therapeutic Drug Levels  Tacrolimus  trough goal: 2-4 ng/mL    Additional Clinical Monitoring/Outcomes  Monitor renal function (SCr and urine output) and liver function (LFTs)  Monitor for signs/symptoms of adverse events (e.g., hyperglycemia, hyperkalemia, hypomagnesemia, hypertension, headache, tremor)    Previous Lab Values  Tacrolimus , Trough   Date/Time Value Ref Range Status   02/03/2024 04:50 AM 6.2 5.0 - 15.0 ng/mL Final   02/02/2024 08:34 AM 5.2 5.0 - 15.0 ng/mL Final   02/01/2024 04:19 AM 6.4 5.0 - 15.0 ng/mL Final   01/31/2024 05:26 AM 5.7 5.0 - 15.0 ng/mL Final   01/30/2024 03:43 AM 5.7 5.0 - 15.0 ng/mL Final   10/06/2014 09:00 AM 6.8 <=20.0 ng/mL Final   08/31/2014 08:34 AM 4.2  Final   08/03/2014 08:22 AM 4.7  Final   07/07/2014 08:37 AM 4.2  Final   06/09/2014 08:27 AM 3.5  Final       Result:  Tacrolimus  level from 8/10 was drawn 3 hours early     Pharmacokinetic Considerations and Significant Drug Interactions:  Concurrent CYP3A4 substrates/inhibitors: None identified    Assessment/Plan:  Recommendedation(s)  Continue current regimen of tacrolimus  1 mg BID per hepatology fellow.    Follow-up  Daily levels have been ordered at 02/04/24 0700.   A pharmacist will continue to monitor and recommend levels as appropriate    Please page service pharmacist with questions/clarifications.    Mili Piltz K Kaisei Gilbo, PharmD

## 2024-02-03 NOTE — Unmapped (Signed)
 Physician Discharge Summary [facility]  7 GENERAL MEDICINE UNIT [facility]  [address]  [place] [address]  Dept: [phone]  Loc: [phone]     Identifying Information:   LASHUN MCCANTS  [birth year]  [id number]    Primary Care Physician: Jama Mayo, MD     Code Status: Full Code    Admit Date: 01/26/2024    Discharge Date: 02/03/2024     Discharge To: Home    Discharge Service: [facility] - General Medicine Floor Team (MED LELON GLENWOOD Edison)     Discharge Attending Physician: Ozell Jenna Mart, MD    Discharge Diagnoses:   Principal Problem:    Chills (POA: Unknown)  Active Problems:    Type 2 diabetes mellitus with hyperglycemia, with long-term current use of insulin     (POA: Not Applicable)    Liver replaced by transplant    (POA: Not Applicable)    Class 3 obesity (POA: Yes)    Immunosuppression due to drug therapy (POA: Not Applicable)    ESRD (end stage renal disease) on dialysis    (POA: Not Applicable)    Type 2 MI (myocardial infarction)    (POA: Yes)    (HFpEF) heart failure with preserved ejection fraction    (POA: Yes)    Elevated liver enzymes (POA: Yes)    Hyperphosphatemia (POA: Yes)    Left arm swelling (POA: Unknown)    Rigors (POA: Unknown)  Resolved Problems:    * No resolved hospital problems. *      Hospital Course:   VADHIR MCNAY is a 58 y.o. 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     Hospital Course is as follows:    Active Problems     Hx of Liver Transplant (2013) - Concern for graft dysfunction   Patient with hx of liver transplant in 2013. Admitted last week with HE, but after further evaluation suspect this was due to uremia. Liver biopsy on 7/15 without evidence of fibrosis, thus very unlikely to have portal pressure or shunt abnormalities. Was followed by Hepatology who evaluated pt with MRI elastography on 8/6 due to concern for cirrhosis of transplanted liver which showed small liver, large caliber portosystemic shunt b/w dilated IVC and venous collaterals. 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 He will follow with Hepatology after discharge, continue Tacrolimus  1mg  BID and MMF 250 BID, and lactulose .      DVT left internal jugular vein  Hx DVT in RLE  Found to have acute DVT in left internal jugular vein including possible obstruction and proximal brachiocephalic vein.  Hypercoagulable ISO liver disease.  Patient with history of DVT of popliteal vein and right lower extremity for which patient takes anticoagulation. Had tunneled line replaced with VIR. Continues on Eliquis  5mg  BID.    Insulin -Dependent T2DM  Patient taking regimen of mounjaro , 60u of tresiba , and 35u of novolog  with meals at home. At time of discharge he was on 40u nightly decreased from his home 60, alongside decreased food intake.      Myoclonus  Present for first 2-3 days of admission, improved after stopping Keflex  which can be a side effect. Pt was without further symptoms for last three days  of admission prior to discharge.    HFpEF (EF >55%)  Patient with elevated pro-BNP to 17,137 on admission. Mild pulmonary vascular congestion seen on CXR however proBNP appears to be chronically elevated given previous ProBNP of ~14000 at last admission. Last echo w EF >55%. Deferred IV diuresis.       The patient's hospital stay has been complicated by the following clinically significant conditions requiring additional evaluation and treatment or having a significant effect of this patient's care: - Anemia POA requiring further investigation or monitoring  - Chronic kidney disease POA requiring further investigation, treatment, or monitoring  - Fatty Liver POA requiring dietary guidance and education     Outpatient Provider Follow Up Issues:   Follow up with Hepatology for consideration of VIR procedure to embolize either/both splenic artery pseudoaneurysm +/- splenic artery.  Diabetes insulin  requirements  Eliquis  5mg  BID from 01/29/24 for 3 months until 04/30/24, but can consult Hematology     Touchbase with Outpatient Provider:  Warm Handoff: Completed on 02/03/24 by Dwain DELENA Chill, MD  (Resident) via [facility]    Procedures:  internal jugular line and dialysis  ______________________________________________________________________  Discharge Medications:     Your Medication List        STOP taking these medications      acetaminophen  325 MG tablet  Commonly known as: TYLENOL      carvedilol  6.25 MG tablet  Commonly known as: COREG      HEARTBURN RELIEF (FAMOTIDINE ) 10 mg tablet  Generic drug: famotidine      oxyCODONE  5 mg/5 mL solution  Commonly known as: ROXICODONE             START taking these medications      atorvastatin  40 MG tablet  Commonly known as: LIPITOR   Take 1 tablet (40 mg total) by mouth daily.     ELIQUIS  5 mg Tab  Generic drug: apixaban   Take 1 tablet (5 mg total) by mouth two (2) times a day.     hydrOXYzine  25 MG tablet  Commonly known as: ATARAX   Take 1 tablet (25 mg total) by mouth every six (6) hours as needed for anxiety, itching or allergies.     MOUNJARO  12.5 mg/0.5 mL Pnij  Generic drug: tirzepatide   Inject 12.5 mg under the skin every seven (7) days.     MOUNJARO  15 mg/0.5 mL Pnij  Generic drug: tirzepatide   Inject 15 mg under the skin every seven (7) days.  Start taking on: February 11, 2024            CHANGE how you take these medications      lactulose  20 gram packet  Commonly known as: CEPHULAC   Take 1 packet (20 g total) by mouth Three (3) times a day.  What changed:   additional instructions  Another medication with the same name was removed. Continue taking this medication, and follow the directions you see here.            CONTINUE taking these medications      amlodipine  10 MG tablet  Commonly known as: NORVASC   Take 1 tablet (10 mg total) by mouth daily.     DEXCOM G6 RECEIVER Misc  Generic drug: blood-glucose,receiver,cont  1 each by Miscellaneous route in the morning. Dispense 1 receiver annually.  Sent to [facility].     DEXCOM G6 TRANSMITTER Devi  Generic drug: blood-glucose transmitter  1 each by Miscellaneous route Every three (3) months. [facility]     DEXCOM G6 TRANSMITTER Espiridion  Generic drug: blood-glucose transmitter  Use as directed to monitor blood glucose     DEXCOM G7 SENSOR Devi  Generic drug: blood-glucose sensor  Use 1 sensor every 10 days.     gabapentin  300 MG capsule  Commonly known as: NEURONTIN   Take 1 capsule (300 mg total) by mouth nightly.     lanthanum  1000 MG chewable tablet  Commonly known as: FOSRENOL   Chew 1 tablet (1,000 mg total) Three (3) times a day with a meal.     multivitamin per tablet  Commonly known as: TAB-A-VITE/THERAGRAN  Take 1 tablet by mouth in the morning.     mycophenolate  250 mg capsule  Commonly known as: CELLCEPT   Take 1 capsule (250 mg total) by mouth two (2) times a day.     NovoLOG  Flexpen U-100 Insulin  100 unit/mL (3 mL) injection pen  Generic drug: insulin  aspart  Inject 0.35 mL (35 Units total) under the skin Three (3) times a day before meals.     PROGRAF  0.5 mg capsule  Generic drug: tacrolimus   Take 2 capsules (1 mg total) by mouth two (2) times a day.     sevelamer  800 mg tablet  Commonly known as: RENVELA   Take 2 tablets (1,600 mg total) by mouth Three (3) times a day with a meal.     TRESIBA  FLEXTOUCH U-200 200 unit/mL (3 mL) Inpn  Generic drug: insulin  degludec  Inject 0.3 mL (60 Units total) under the skin daily. Max dose of 100 units per day.     TRUEPLUS PEN NEEDLE 32 gauge x 5/32 (4 mm) Ndle  Generic drug: pen needle, diabetic  Use with insulin  up to 4 times/day as needed.     TRUEPLUS PEN NEEDLE 32 gauge x 5/32 (4 mm) Ndle  Generic drug: pen needle, diabetic  Use with insulin  up to 4 times a day as needed.     ursodiol  300 mg capsule  Commonly known as: ACTIGALL   Take 2 capsules (600 mg total) by mouth two (2) times a day.     VITAMIN D3 ORAL  Take 2,000 Units by mouth in the morning.     XIFAXAN  550 mg Tab  Generic drug: rifAXIMin   Take 1 tablet (550 mg total) by mouth two (2) times a day.            ASK your doctor about these medications      pantoprazole  40 MG tablet  Commonly known as: Protonix   Take 1 tablet (40 mg total) by mouth daily before breakfast.              Allergies:  Patient has no known allergies.  ______________________________________________________________________  Pending Test Results:      Most Recent Labs:  All lab results last 24 hours -   Recent Results (from the past 24 hours)   POCT Glucose    Collection Time: 02/02/24  5:24 PM   Result Value Ref Range    Glucose, POC 124 70 - 179 mg/dL   POCT Glucose    Collection Time: 02/02/24  8:30 PM   Result Value Ref Range    Glucose, POC 167 70 - 179 mg/dL   Hepatic Function Panel    Collection Time: 02/03/24  4:50 AM   Result Value Ref Range    Albumin 2.9 (L) 3.4 - 5.0 g/dL    Total Protein 6.4 5.7 - 8.2 g/dL    Total Bilirubin 0.6 0.3 - 1.2 mg/dL    Bilirubin, Direct 9.69 0.00 -  0.30 mg/dL    AST 20 <=65 U/L    ALT <7 (L) 10 - 49 U/L    Alkaline Phosphatase 165 (H) 46 - 116 U/L   PT-INR    Collection Time: 02/03/24  4:50 AM   Result Value Ref Range    PT 18.6 (H) 9.9 - 12.6 sec    INR 1.63    Tacrolimus  Level, Trough    Collection Time: 02/03/24  4:50 AM   Result Value Ref Range    Tacrolimus , Trough 6.2 5.0 - 15.0 ng/mL   Phosphorus Level    Collection Time: 02/03/24  4:50 AM   Result Value Ref Range    Phosphorus 3.7 2.4 - 5.1 mg/dL   Basic Metabolic Panel    Collection Time: 02/03/24  4:50 AM   Result Value Ref Range    Sodium 139 135 - 145 mmol/L    Potassium 3.6 3.4 - 4.8 mmol/L    Chloride 102 98 - 107 mmol/L    CO2 26.0 20.0 - 31.0 mmol/L    Anion Gap 11 5 - 14 mmol/L    BUN 15 9 - 23 mg/dL    Creatinine 3.42 (H) 0.73 - 1.18 mg/dL    BUN/Creatinine Ratio 2     eGFR CKD-EPI (2021) Male 9 (L) >=60 mL/min/1.11m2    Glucose 135 70 - 179 mg/dL    Calcium  7.8 (L) 8.7 - 10.4 mg/dL   POCT Glucose    Collection Time: 02/03/24  8:21 AM   Result Value Ref Range    Glucose, POC 67 (L) 70 - 179 mg/dL   POCT Glucose    Collection Time: 02/03/24  9:03 AM   Result Value Ref Range    Glucose, POC 98 70 - 179 mg/dL   POCT Glucose    Collection Time: 02/03/24 11:34 AM   Result Value Ref Range    Glucose, POC 102 70 - 179 mg/dL       Relevant Studies/Radiology:  No results found.  ______________________________________________________________________  Discharge Instructions:                    Appointments which have been scheduled for you      Feb 05, 2024 9:00 AM  (Arrive by 8:40 AM)  RETURN GENERAL with Oliva Sprinkles, MD  [facility] GENERAL AND BARIATRIC SURGERY [facility] REGION) [address]  [address]  [place] [address]  [phone]        Feb 12, 2024 11:20 AM  (Arrive by 11:05 AM)  NEW EP with MEDCAR EP FELLOW DR LOREAN  [facility] CARDIOLOGY [place] [facility] REGION) [address]  [facility] [address]  [place] [address]  [phone]        Feb 13, 2024 8:20 AM  (Arrive by 8:05 AM)  RETURN ENDOCRINE with Ponce Arnulfo Kid, MD  [facility] REGION) [address] [place] [address]  [place] [address]  [phone]             ______________________________________________________________________  Discharge Day Services:  BP 130/68  - Pulse 57  - Temp 37.3 ??C (99.1 ??F) (Oral)  - Resp 16  - Ht 177.8 cm (5' 10)  - Wt (!) 123.2 kg (271 lb 9.7 oz) Comment: after dialysis - SpO2 94%  - BMI 38.97 kg/m??     Pt seen on the day of discharge and determined appropriate for discharge.    Condition  at Discharge: fair    Length of Discharge: I spent greater than 30 mins in the discharge of this patient.    Dwain Chill, M.D., M.T.S.  [facility] Internal Medicine, PGY-1

## 2024-02-03 NOTE — Unmapped (Signed)
 Patient admitted for Chills. Patient remained alert oriented X 4. On room air. Vitals stable. Patient complained of nausea PRN Zofran  given with good effect. No further complain voiced. Blood glucose checked and insulin  coverage provided as per orders. Fall precaution maintained.     Shift Summary  Ondansetron  was given for nausea, and the patient remained calm after administration.   Blood glucose was checked and managed with correctional insulin  during the shift.   Pain was consistently reported as 0, and no pain interventions were needed.   Vitals and positioning were monitored regularly, with no significant changes or concerns noted.   Overall, the patient maintained comfort and stability throughout the shift.    Optimal Comfort and Wellbeing: No chills documented during the shift and temperature remained below 38??C, with ondansetron  given for nausea and calm behavior noted after administration.    Optimal Pain Control and Function: Pain score remained at 0 throughout the shift with no pain interventions required.    Blood Glucose Level Within Target Range: Blood glucose was 167 mg/dL during the shift and 1 unit of correctional insulin  was administered.

## 2024-02-04 DIAGNOSIS — Z794 Long term (current) use of insulin: Principal | ICD-10-CM

## 2024-02-04 DIAGNOSIS — E1165 Type 2 diabetes mellitus with hyperglycemia: Principal | ICD-10-CM

## 2024-02-04 MED ORDER — LACTULOSE 10 GRAM/15 ML ORAL SOLUTION
Freq: Three times a day (TID) | ORAL | 2 refills | 32.00000 days | Status: CP
Start: 2024-02-04 — End: ?

## 2024-02-04 MED ORDER — MOUNJARO 12.5 MG/0.5 ML SUBCUTANEOUS PEN INJECTOR
SUBCUTANEOUS | 0 refills | 7.00000 days | Status: CP
Start: 2024-02-04 — End: ?

## 2024-02-04 MED ORDER — SEVELAMER CARBONATE 800 MG TABLET
ORAL_TABLET | Freq: Three times a day (TID) | ORAL | 11 refills | 30.00000 days | Status: CP
Start: 2024-02-04 — End: 2025-02-03

## 2024-02-04 MED ORDER — LACTULOSE 20 GRAM/30 ML ORAL SOLUTION
Freq: Every day | ORAL | 0 refills | 30.00000 days | Status: CP
Start: 2024-02-04 — End: ?

## 2024-02-04 MED ORDER — APIXABAN 5 MG TABLET
ORAL_TABLET | Freq: Two times a day (BID) | ORAL | 0 refills | 30.00000 days | Status: CP
Start: 2024-02-04 — End: ?

## 2024-02-04 NOTE — Unmapped (Signed)
 Physician Discharge Summary [facility]  7 GENERAL MEDICINE UNIT [facility]  [address]  [place] [address]  Dept: [phone]  Loc: [phone]     Identifying Information:   LASHUN MCCANTS  [birth year]  [id number]    Primary Care Physician: Jama Mayo, MD     Code Status: Full Code    Admit Date: 01/26/2024    Discharge Date: 02/03/2024     Discharge To: Home    Discharge Service: [facility] - General Medicine Floor Team (MED LELON GLENWOOD Edison)     Discharge Attending Physician: Ozell Jenna Mart, MD    Discharge Diagnoses:   Principal Problem:    Chills (POA: Unknown)  Active Problems:    Type 2 diabetes mellitus with hyperglycemia, with long-term current use of insulin     (POA: Not Applicable)    Liver replaced by transplant    (POA: Not Applicable)    Class 3 obesity (POA: Yes)    Immunosuppression due to drug therapy (POA: Not Applicable)    ESRD (end stage renal disease) on dialysis    (POA: Not Applicable)    Type 2 MI (myocardial infarction)    (POA: Yes)    (HFpEF) heart failure with preserved ejection fraction    (POA: Yes)    Elevated liver enzymes (POA: Yes)    Hyperphosphatemia (POA: Yes)    Left arm swelling (POA: Unknown)    Rigors (POA: Unknown)  Resolved Problems:    * No resolved hospital problems. *      Hospital Course:   VADHIR MCNAY is a 58 y.o. 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     Hospital Course is as follows:    Active Problems     Hx of Liver Transplant (2013) - Concern for graft dysfunction   Patient with hx of liver transplant in 2013. Admitted last week with HE, but after further evaluation suspect this was due to uremia. Liver biopsy on 7/15 without evidence of fibrosis, thus very unlikely to have portal pressure or shunt abnormalities. Was followed by Hepatology who evaluated pt with MRI elastography on 8/6 due to concern for cirrhosis of transplanted liver which showed small liver, large caliber portosystemic shunt b/w dilated IVC and venous collaterals. 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 He will follow with Hepatology after discharge, continue Tacrolimus  1mg  BID and MMF 250 BID, and lactulose .      DVT left internal jugular vein  Hx DVT in RLE  Found to have acute DVT in left internal jugular vein including possible obstruction and proximal brachiocephalic vein.  Hypercoagulable ISO liver disease.  Patient with history of DVT of popliteal vein and right lower extremity for which patient takes anticoagulation. Had tunneled line replaced with VIR. Continues on Eliquis  5mg  BID.    Insulin -Dependent T2DM  Patient taking regimen of mounjaro , 60u of tresiba , and 35u of novolog  with meals at home. At time of discharge he was on 40u nightly decreased from his home 60, alongside decreased food intake.      Myoclonus  Present for first 2-3 days of admission, improved after stopping Keflex  which can be a side effect. Pt was without further symptoms for last three days  of admission prior to discharge.    HFpEF (EF >55%)  Patient with elevated pro-BNP to 17,137 on admission. Mild pulmonary vascular congestion seen on CXR however proBNP appears to be chronically elevated given previous ProBNP of ~14000 at last admission. Last echo w EF >55%. Deferred IV diuresis.       The patient's hospital stay has been complicated by the following clinically significant conditions requiring additional evaluation and treatment or having a significant effect of this patient's care: - Anemia POA requiring further investigation or monitoring  - Chronic kidney disease POA requiring further investigation, treatment, or monitoring  - Fatty Liver POA requiring dietary guidance and education     Outpatient Provider Follow Up Issues:   Follow up with Hepatology for consideration of VIR procedure to embolize either/both splenic artery pseudoaneurysm +/- splenic artery.  Diabetes insulin  requirements  Eliquis  5mg  BID from 01/29/24 for 3 months until 04/30/24, but can consult Hematology     Touchbase with Outpatient Provider:  Warm Handoff: Completed on 02/03/24 by Dwain DELENA Chill, MD  (Resident) via [facility]    Procedures:  internal jugular line and dialysis  ______________________________________________________________________  Discharge Medications:     Your Medication List        STOP taking these medications      acetaminophen  325 MG tablet  Commonly known as: TYLENOL      carvedilol  6.25 MG tablet  Commonly known as: COREG      HEARTBURN RELIEF (FAMOTIDINE ) 10 mg tablet  Generic drug: famotidine      oxyCODONE  5 mg/5 mL solution  Commonly known as: ROXICODONE             START taking these medications      atorvastatin  40 MG tablet  Commonly known as: LIPITOR   Take 1 tablet (40 mg total) by mouth daily.     ELIQUIS  5 mg Tab  Generic drug: apixaban   Take 1 tablet (5 mg total) by mouth two (2) times a day.     hydrOXYzine  25 MG tablet  Commonly known as: ATARAX   Take 1 tablet (25 mg total) by mouth every six (6) hours as needed for anxiety, itching or allergies.     MOUNJARO  12.5 mg/0.5 mL Pnij  Generic drug: tirzepatide   Inject 12.5 mg under the skin every seven (7) days.     MOUNJARO  15 mg/0.5 mL Pnij  Generic drug: tirzepatide   Inject 15 mg under the skin every seven (7) days.  Start taking on: February 11, 2024            CHANGE how you take these medications      lactulose  20 gram packet  Commonly known as: CEPHULAC   Take 1 packet (20 g total) by mouth Three (3) times a day.  What changed:   additional instructions  Another medication with the same name was removed. Continue taking this medication, and follow the directions you see here.            CONTINUE taking these medications      amlodipine  10 MG tablet  Commonly known as: NORVASC   Take 1 tablet (10 mg total) by mouth daily.     DEXCOM G6 RECEIVER Misc  Generic drug: blood-glucose,receiver,cont  1 each by Miscellaneous route in the morning. Dispense 1 receiver annually.  Sent to [facility].     DEXCOM G6 TRANSMITTER Devi  Generic drug: blood-glucose transmitter  1 each by Miscellaneous route Every three (3) months. [facility]     DEXCOM G6 TRANSMITTER Espiridion  Generic drug: blood-glucose transmitter  Use as directed to monitor blood glucose     DEXCOM G7 SENSOR Devi  Generic drug: blood-glucose sensor  Use 1 sensor every 10 days.     gabapentin  300 MG capsule  Commonly known as: NEURONTIN   Take 1 capsule (300 mg total) by mouth nightly.     lanthanum  1000 MG chewable tablet  Commonly known as: FOSRENOL   Chew 1 tablet (1,000 mg total) Three (3) times a day with a meal.     multivitamin per tablet  Commonly known as: TAB-A-VITE/THERAGRAN  Take 1 tablet by mouth in the morning.     mycophenolate  250 mg capsule  Commonly known as: CELLCEPT   Take 1 capsule (250 mg total) by mouth two (2) times a day.     NovoLOG  Flexpen U-100 Insulin  100 unit/mL (3 mL) injection pen  Generic drug: insulin  aspart  Inject 0.35 mL (35 Units total) under the skin Three (3) times a day before meals.     PROGRAF  0.5 mg capsule  Generic drug: tacrolimus   Take 2 capsules (1 mg total) by mouth two (2) times a day.     sevelamer  800 mg tablet  Commonly known as: RENVELA   Take 2 tablets (1,600 mg total) by mouth Three (3) times a day with a meal.     TRESIBA  FLEXTOUCH U-200 200 unit/mL (3 mL) Inpn  Generic drug: insulin  degludec  Inject 0.3 mL (60 Units total) under the skin daily. Max dose of 100 units per day.     TRUEPLUS PEN NEEDLE 32 gauge x 5/32 (4 mm) Ndle  Generic drug: pen needle, diabetic  Use with insulin  up to 4 times/day as needed.     TRUEPLUS PEN NEEDLE 32 gauge x 5/32 (4 mm) Ndle  Generic drug: pen needle, diabetic  Use with insulin  up to 4 times a day as needed.     ursodiol  300 mg capsule  Commonly known as: ACTIGALL   Take 2 capsules (600 mg total) by mouth two (2) times a day.     VITAMIN D3 ORAL  Take 2,000 Units by mouth in the morning.     XIFAXAN  550 mg Tab  Generic drug: rifAXIMin   Take 1 tablet (550 mg total) by mouth two (2) times a day.            ASK your doctor about these medications      pantoprazole  40 MG tablet  Commonly known as: Protonix   Take 1 tablet (40 mg total) by mouth daily before breakfast.              Allergies:  Patient has no known allergies.  ______________________________________________________________________  Pending Test Results:      Most Recent Labs:  All lab results last 24 hours -   Recent Results (from the past 24 hours)   POCT Glucose    Collection Time: 02/02/24  5:24 PM   Result Value Ref Range    Glucose, POC 124 70 - 179 mg/dL   POCT Glucose    Collection Time: 02/02/24  8:30 PM   Result Value Ref Range    Glucose, POC 167 70 - 179 mg/dL   Hepatic Function Panel    Collection Time: 02/03/24  4:50 AM   Result Value Ref Range    Albumin 2.9 (L) 3.4 - 5.0 g/dL    Total Protein 6.4 5.7 - 8.2 g/dL    Total Bilirubin 0.6 0.3 - 1.2 mg/dL    Bilirubin, Direct 9.69 0.00 -  0.30 mg/dL    AST 20 <=65 U/L    ALT <7 (L) 10 - 49 U/L    Alkaline Phosphatase 165 (H) 46 - 116 U/L   PT-INR    Collection Time: 02/03/24  4:50 AM   Result Value Ref Range    PT 18.6 (H) 9.9 - 12.6 sec    INR 1.63    Tacrolimus  Level, Trough    Collection Time: 02/03/24  4:50 AM   Result Value Ref Range    Tacrolimus , Trough 6.2 5.0 - 15.0 ng/mL   Phosphorus Level    Collection Time: 02/03/24  4:50 AM   Result Value Ref Range    Phosphorus 3.7 2.4 - 5.1 mg/dL   Basic Metabolic Panel    Collection Time: 02/03/24  4:50 AM   Result Value Ref Range    Sodium 139 135 - 145 mmol/L    Potassium 3.6 3.4 - 4.8 mmol/L    Chloride 102 98 - 107 mmol/L    CO2 26.0 20.0 - 31.0 mmol/L    Anion Gap 11 5 - 14 mmol/L    BUN 15 9 - 23 mg/dL    Creatinine 3.42 (H) 0.73 - 1.18 mg/dL    BUN/Creatinine Ratio 2     eGFR CKD-EPI (2021) Male 9 (L) >=60 mL/min/1.11m2    Glucose 135 70 - 179 mg/dL    Calcium  7.8 (L) 8.7 - 10.4 mg/dL   POCT Glucose    Collection Time: 02/03/24  8:21 AM   Result Value Ref Range    Glucose, POC 67 (L) 70 - 179 mg/dL   POCT Glucose    Collection Time: 02/03/24  9:03 AM   Result Value Ref Range    Glucose, POC 98 70 - 179 mg/dL   POCT Glucose    Collection Time: 02/03/24 11:34 AM   Result Value Ref Range    Glucose, POC 102 70 - 179 mg/dL       Relevant Studies/Radiology:  No results found.  ______________________________________________________________________  Discharge Instructions:                    Appointments which have been scheduled for you      Feb 05, 2024 9:00 AM  (Arrive by 8:40 AM)  RETURN GENERAL with Oliva Sprinkles, MD  [facility] GENERAL AND BARIATRIC SURGERY [facility] REGION) [address]  [address]  [place] [address]  [phone]        Feb 12, 2024 11:20 AM  (Arrive by 11:05 AM)  NEW EP with MEDCAR EP FELLOW DR LOREAN  [facility] CARDIOLOGY [place] [facility] REGION) [address]  [facility] [address]  [place] [address]  [phone]        Feb 13, 2024 8:20 AM  (Arrive by 8:05 AM)  RETURN ENDOCRINE with Ponce Arnulfo Kid, MD  [facility] REGION) [address] [place] [address]  [place] [address]  [phone]             ______________________________________________________________________  Discharge Day Services:  BP 130/68  - Pulse 57  - Temp 37.3 ??C (99.1 ??F) (Oral)  - Resp 16  - Ht 177.8 cm (5' 10)  - Wt (!) 123.2 kg (271 lb 9.7 oz) Comment: after dialysis - SpO2 94%  - BMI 38.97 kg/m??     Pt seen on the day of discharge and determined appropriate for discharge.    Condition  at Discharge: fair    Length of Discharge: I spent greater than 30 mins in the discharge of this patient.    Dwain Chill, M.D., M.T.S.  [facility] Internal Medicine, PGY-1

## 2024-02-05 NOTE — Unmapped (Signed)
 Assessment/Plan:      Assessment:     58 y.o.male with ESRD on hemodialysis. Previous liver transplant.  He had a right upper arm AVF created about 1 year ago. It thrombosed last month. He is now catheter dependent. I am asked to create new access. The patient expresses dissatisfaction with having a catheter.     Existing fistula / graft: Functional status & general condition:  Previous access is thrombosed and could not be restored by interventionalist.          Plan: Will schedule basilic vein fistula vs graft in the right arm.     Additional Diagnostic Studies:   Result Text       Peripheral Vascular Lab      [address]    [place], [place] [address]     [facility] VESSEL MAPPING FOR HEMODIALYSIS BILATERAL  Patient Demographics  Pt. Name: Richard [name] Location: [facility] Inpatient Lab  MRN:      [id number]         Sex:      M  DOB:      1965-12-23         Age:      65 years     Study Information  Authorizing Provider [id number] ERIC C       Performed Time        11/22/2022 8:41:11  Name                 [facility]                                   PM  Ordering Physician   ERIC C [facility]      Patient Location      [facility]  Accession Number     [id number]      Technologist          Dorn Curls  Diagnosis:                              Assisting                                          Technologist  Ordered Reason For Exam: eval for fistula (nephrology recs)  History: Chronic kidney disease. H/o liver transplant 2013.   Final Interpretation     Right: Normal Doppler waveforms detected in the brachial, radial and         ulnar arteries. No plaque in the brachial artery. No plaque         in the radial artery.  Left: Normal Doppler waveforms detected in the brachial, radial and        ulnar arteries. No plaque in the brachial artery. No plaque in        the radial artery.     Electronically signed by [id number] Almeda Dolores MD on 11/23/2022 at 8:16:18 AM.     Examination Protocol:  The Duplex scanner is used on the requested extremity to obtain cross sectional diameter measurements of cephalic veins from the shoulder to the wrist and basilic veins from their origin to the elbow. Doppler spectral waveforms are obtained from subclavian, brachial, radial, and ulnar arteries. The brachial and radial arteries are scanned for the presence of plaque and diameter  measurements are recorded.     +-----------------+-----------------+--------------+----------------+  -Right            -Cephalic Diameter-Cephalic Depth-Basilic Diameter-  +-----------------+-----------------+--------------+----------------+  -         Shoulder-           4.7 mm-       18.3 mm-                -  +-----------------+-----------------+--------------+----------------+  -   Prox upper arm-           5.2 mm-       17.1 mm-                -  +-----------------+-----------------+--------------+----------------+  -    Mid upper arm-           4.9 mm-       11.7 mm-3.5 mm          -  +-----------------+-----------------+--------------+----------------+  -   Dist upper arm-           5.7 mm-        8.0 mm-3.2 mm          -  +-----------------+-----------------+--------------+----------------+  -Antecubital fossa-           4.9 mm-        5.9 mm-2.2 mm          -  +-----------------+-----------------+--------------+----------------+  -     Prox forearm-           2.9 mm-       12.4 mm-                -  +-----------------+-----------------+--------------+----------------+  -      Mid forearm-                 -              -                -  +-----------------+-----------------+--------------+----------------+  -     Dist forearm-                 -              -                -  +-----------------+-----------------+--------------+----------------+  -            Wrist-                 -              -                -  +-----------------+-----------------+--------------+----------------+  Right Basilic Vein Length = 10.0 cm  Right Comment: Cephalic vein not visualized in mid-distal forearm due to a line.     +-----------------+----------------+--------------+----------------+  -Left             -Cephalic        -Cephalic Depth-Basilic Diameter-  -                 -Diameter        -              -                -  +-----------------+----------------+--------------+----------------+  -         Shoulder-          4.0 mm-  22.6 mm-                -  +-----------------+----------------+--------------+----------------+  -   Prox upper arm-          3.4 mm-       15.6 mm-                -  +-----------------+----------------+--------------+----------------+  -    Mid upper arm-          3.4 mm-       13.2 mm-                -  +-----------------+----------------+--------------+----------------+  -   Dist upper arm-          2.9 mm-       11.4 mm-4.1 mm          -  +-----------------+----------------+--------------+----------------+  -Antecubital fossa-          2.3 mm-       12.2 mm-3.5 mm          -  +-----------------+----------------+--------------+----------------+  -     Prox forearm-          1.3 mm-       11.4 mm-2.9 mm          -  +-----------------+----------------+--------------+----------------+  -      Mid forearm-          3.1 mm-       14.9 mm-1.5 mm          -  +-----------------+----------------+--------------+----------------+  -     Dist forearm-          2.2 mm-       10.3 mm-                -  +-----------------+----------------+--------------+----------------+  -            Wrist-          1.9 mm-       11.5 mm-                -  +-----------------+----------------+--------------+----------------+  Basilic Vein Length = 18.0 cm       Subjective:     Subjective :    Richard [name] is a 58 y.o. male with ESRD on hemodialysis. He has been referred for initial evaluation, regarding vascular access options. He is now catheter dependent since late last month. Fistula could not be successfully thrombectomized. I am asked to create new access. He has been started on Eliquis  due to catheter related thrombosis of the left jugular.     Surgical Access History: Right upper extremity: Arteriovenous fistula: -  (brachiocephalic fistula). Fistula was created in 2 stages given his body habitus. Had a 2nd stage elevation.     Catheter Placement History: The patient has recently undergone the tunneled catheter placement in the following location(s): left internal jugular vein.    Review of Past History / Family History / Problem List:    The following portions of the patient's history were reviewed and updated as appropriate: past history, family history, and problem list.    Review of Systems     Objective:     Objective :  BP 145/80 (BP Site: L Arm, BP Position: Sitting, BP Cuff Size: Medium)  - Pulse 60  - Temp 36.4 ??C (97.5 ??F) (Temporal)  - Ht 177.8 cm (5' 10)  - Wt (!) 119.8 kg (264 lb 1.6 oz)  - SpO2 97%  - BMI 37.89  kg/m??       Physical Exam  No upper extremity edema. Palpable radial and brachial pulses. Right upper arm fistula thrombosed. No other superficial veins noted bilaterally on inspection

## 2024-02-06 DIAGNOSIS — Z09 Encounter for follow-up examination after completed treatment for conditions other than malignant neoplasm: Principal | ICD-10-CM

## 2024-02-07 NOTE — Unmapped (Signed)
 Patient notified to hold eliquis  3 days prior to upcoming surgery with Dr. Marchelle.  He states he is aware and will do so.

## 2024-02-07 NOTE — Unmapped (Signed)
 Called patient to discuss questions/plan.    We discussed the role of doing a fibroscan because his biopsy showed no fibrosis and MRE was poor quality. We still don't know why he has PSS on imaging and HE.     We also discussed the plan to embolize the splenic artery pseudoaneurysm. For now, will plan to do that without splenic artery embo and work on optimizing lactulose  and dialysis to see if this controls HE.    If not, may need to revisit splenic artery embo/re-review in HB to see if any issues with vasculature that might explain this picture.     I spoke with patient and his sister about the above and they verbalized understanding.

## 2024-02-11 MED ORDER — MOUNJARO 15 MG/0.5 ML SUBCUTANEOUS PEN INJECTOR
SUBCUTANEOUS | 3 refills | 0.00000 days | Status: CP
Start: 2024-02-11 — End: ?
  Filled 2024-02-14: qty 2, 28d supply, fill #0

## 2024-02-12 ENCOUNTER — Ambulatory Visit
Admit: 2024-02-12 | Payer: BLUE CROSS/BLUE SHIELD | Attending: Clinical Cardiac Electrophysiology | Primary: Clinical Cardiac Electrophysiology

## 2024-02-12 NOTE — Unmapped (Unsigned)
 DIVISION OF CARDIOLOGY: Electrophysiology  [employer]  [name]        Date of Service: 02/12/2024      PCP: Referring Provider:   Jama Mayo, MD  [address] [name]  [place] [address]  Phone: [phone]  Fax: [phone] Dasie Comer Fuller, MD  [address]  [address]  [place],  [place] [address]  Phone: [phone]  Fax: [phone]     Assessment and Plan:     #Persistent atrial fibrillation    -apixaban     The patient was seen and discussed with Dr. Lorean.    No follow-ups on file.      Subjective:        Reason for Consultation: ***    History of Present Illness: [name] is a 58 y.o. 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 The patient is seen at the request of Comer Fuller Dasie for evaluation of ***.        Cardiovascular History:  ***    Cardiac Meds:  ***    Past medical history:  Problem List[1]    Medications:   Patient's Medications   New Prescriptions    No medications on file   Previous Medications    AMLODIPINE  (NORVASC ) 10 MG TABLET    Take 1 tablet (10 mg total) by mouth daily.    APIXABAN  (ELIQUIS ) 5 MG TAB    Take 1 tablet (5 mg total) by mouth two (2) times a day.    APIXABAN  (ELIQUIS ) 5 MG TAB    Take 1 tablet (5 mg total) by mouth two (2) times a day.    ATORVASTATIN  (LIPITOR ) 40 MG TABLET    Take 1 tablet (40 mg total) by mouth daily.    BLOOD-GLUCOSE METER,CONTINUOUS (DEXCOM G6 RECEIVER) MISC    1 each by Miscellaneous route in the morning. Dispense 1 receiver annually.  Sent to [facility].    BLOOD-GLUCOSE SENSOR (DEXCOM G7 SENSOR) DEVI    Use 1 sensor every 10 days.    BLOOD-GLUCOSE TRANSMITTER (DEXCOM G6 TRANSMITTER) DEVI    1 each by Miscellaneous route Every three (3) months. [facility]    BLOOD-GLUCOSE TRANSMITTER (DEXCOM G6 TRANSMITTER) DEVI    Use as directed to monitor blood glucose    CHOLECALCIFEROL, VITAMIN D3, (VITAMIN D3 ORAL)    Take 2,000 Units by mouth in the morning.    GABAPENTIN  (NEURONTIN ) 300 MG CAPSULE    Take 1 capsule (300 mg total) by mouth nightly.    HYDROXYZINE  (ATARAX ) 25 MG TABLET    Take 1 tablet (25 mg total) by mouth every six (6) hours as needed for anxiety, itching or allergies.    INSULIN  ASPART (NOVOLOG  FLEXPEN U-100 INSULIN ) 100 UNIT/ML (3 ML) INJECTION PEN    Inject 0.35 mL (35 Units total) under the skin Three (3) times a day before meals.    INSULIN  DEGLUDEC (TRESIBA  FLEXTOUCH U-200) 200 UNIT/ML (3 ML) INPN    Inject 0.3 mL (60 Units total) under the skin daily. Max dose of 100 units per day.    LACTULOSE  10 GRAM/15 ML SOLUTION    Take 30 mL (20 g total) by mouth Three (3) times a day.    LACTULOSE  20 GRAM/30 ML SOLN    Take 15 mL (10 g total) by mouth daily.  LANTHANUM  (FOSRENOL ) 1000 MG CHEWABLE TABLET    Chew 1 tablet (1,000 mg total) Three (3) times a day with a meal.    MULTIVITAMIN (TAB-A-VITE/THERAGRAN) PER TABLET    Take 1 tablet by mouth in the morning.    MYCOPHENOLATE  (CELLCEPT ) 250 MG CAPSULE    Take 1 capsule (250 mg total) by mouth two (2) times a day.    PANTOPRAZOLE  (PROTONIX ) 40 MG TABLET    Take 1 tablet (40 mg total) by mouth daily before breakfast.    PEN NEEDLE, DIABETIC (TRUEPLUS PEN NEEDLE) 32 GAUGE X 5/32 (4 MM) NDLE    Use with insulin  up to 4 times a day as needed.    PEN NEEDLE, DIABETIC 32 GAUGE X 5/32 (4 MM) NDLE    Use with insulin  up to 4 times/day as needed.    RIFAXIMIN  (XIFAXAN ) 550 MG TAB    Take 1 tablet (550 mg total) by mouth two (2) times a day.    SEVELAMER  (RENVELA ) 800 MG TABLET    Take 2 tablets (1,600 mg total) by mouth Three (3) times a day with a meal.    SEVELAMER  (RENVELA ) 800 MG TABLET    Take 1 tablet (800 mg total) by mouth Three (3) times a day with a meal.    TACROLIMUS  (PROGRAF ) 0.5 MG CAPSULE    Take 2 capsules (1 mg total) by mouth two (2) times a day.    TIRZEPATIDE  (MOUNJARO ) 12.5 MG/0.5 ML PNIJ    Inject 12.5 mg under the skin every seven (7) days.    TIRZEPATIDE  (MOUNJARO ) 15 MG/0.5 ML PNIJ    Inject 15 mg under the skin every seven (7) days.    URSODIOL  (ACTIGALL ) 300 MG CAPSULE    Take 2 capsules (600 mg total) by mouth two (2) times a day.   Modified Medications    No medications on file   Discontinued Medications    No medications on file       Allergies:  Allergies[2]    Social History:  He  reports that he has never smoked. He has been exposed to tobacco smoke. He has never used smokeless tobacco. He reports that he does not drink alcohol and does not use drugs.    Family History:  His family history includes Cancer in his maternal grandmother; Diabetes in his father and paternal grandfather; Liver disease in his maternal uncle.    Review of Systems  No fevers/chills, no n/v. 10 systems were reviewed and negative except as noted in HPI.      Objective:       Physical Exam  There were no vitals taken for this visit.   Wt Readings from Last 3 Encounters:   02/05/24 (!) 119.8 kg (264 lb 1.6 oz)   01/26/24 (!) 123.2 kg (271 lb 9.7 oz)   01/24/24 (!) 122.5 kg (270 lb)       General: Alert, NAD, sitting up comfortably in chair at side of exam table  HEENT: Sclera anicteric, ***wearing mask  Cardiac: normal rate, regular rhythm, no murmurs rubs or gallops. No carotid bruits. No JVD at 90 degrees with hepatojugular reflux. Radial and DP pulses 2+ b/l  Pulmonary: CTAB, no increased work of breathing  Abdomen: Soft, non-tender, non distended.   Extremities: No LE edema. Legs are warm  Neuro: Alert and oriented. No focal deficits         Most recent labs   Lab Results   Component Value Date    Sodium  139 02/03/2024    Sodium 138 10/06/2014    Potassium 3.6 02/03/2024    Potassium 4.2 10/06/2014    Chloride 102 02/03/2024    Chloride 97 (A) 08/09/2021    CO2 26.0 02/03/2024    CO2 24 10/06/2014    BUN 15 02/03/2024    BUN 38 (A) 08/09/2021    Creatinine 6.57 (H) 02/03/2024 Creatinine 1.33 10/06/2014    Magnesium  2.2 01/26/2024    Magnesium  1.5 10/06/2014     Lab Results   Component Value Date    HGB 11.3 (L) 01/31/2024    HGB 15.3 10/06/2014    Hgb, blood gas 8.3 (L) 03/03/2012    MCV 87.0 01/31/2024    MCV 81.7 10/06/2014    Platelet 203 01/31/2024    Platelet 179 10/06/2014     Lab Results   Component Value Date    Cholesterol, Total 211 (H) 04/24/2022    Cholesterol, Total 175 03/16/2014    Triglycerides 369 (H) 04/24/2022    Triglycerides 183 (H) 04/15/2012    HDL 37 (L) 03/16/2014    Cholesterol, HDL 33 (L) 04/24/2022    Cholesterol, Non-HDL, Calculated 178 (H) 04/24/2022    LDL Cholesterol, Calculated 85 04/15/2012    Cholesterol, LDL, Calculated 104 (H) 04/24/2022    Hemoglobin A1C 10.0 (A) 01/14/2024    Hemoglobin A1C 5.7 03/16/2014    TSH 3.193 01/26/2024    TSH 2.65 05/07/2012    PRO-BNP 17,137.0 (H) 01/26/2024    INR 1.63 02/03/2024    INR 1.0 05/23/2012         ECG:  *** - (personally reviewed)    Echo: ***    LHC: ***    Stress test: ***    Ambulatory Monitor: ***          [1]   Patient Active Problem List  Diagnosis    Hypertension    Acanthosis nigricans    Type 2 diabetes mellitus with hyperglycemia, with long-term current use of insulin         H/O gastroesophageal reflux (GERD)    Liver replaced by transplant        Class 3 obesity    Obstructive sleep apnea syndrome    Proteinuria due to type 2 diabetes mellitus        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)    ESRD (end stage renal disease) on dialysis        Type 2 MI (myocardial infarction)        (HFpEF) heart failure with preserved ejection fraction        Severe hyperglycemia due to diabetes mellitus        Cellulitis of right lower extremity    Acute kidney injury superimposed on chronic kidney disease    CAD (coronary artery disease)    Elevated liver enzymes    Hyperphosphatemia    Acute post-operative pain    Cirrhosis of transplanted liver        Hepatic encephalopathy        Thrombophlebitis of arm, right    Left arm swelling    Rigors    Chills    Chronic kidney disease   [2] No Known Allergies

## 2024-02-13 DIAGNOSIS — Z796 Long-term use of immunosuppressant medication: Principal | ICD-10-CM

## 2024-02-13 DIAGNOSIS — Z944 Liver transplant status: Principal | ICD-10-CM

## 2024-02-13 MED ORDER — RIFAXIMIN 550 MG TABLET
ORAL_TABLET | Freq: Two times a day (BID) | ORAL | 3 refills | 90.00000 days | Status: CP
Start: 2024-02-13 — End: 2025-02-12
  Filled 2024-04-07: qty 180, 90d supply, fill #0

## 2024-02-13 MED ORDER — TACROLIMUS 0.5 MG CAPSULE, IMMEDIATE-RELEASE
ORAL_CAPSULE | Freq: Two times a day (BID) | ORAL | 3 refills | 90.00000 days | Status: CP
Start: 2024-02-13 — End: ?
  Filled 2024-02-14: qty 120, 30d supply, fill #0

## 2024-02-13 MED ORDER — URSODIOL 300 MG CAPSULE
ORAL_CAPSULE | Freq: Two times a day (BID) | ORAL | 3 refills | 90.00000 days | Status: CP
Start: 2024-02-13 — End: ?
  Filled 2024-03-11: qty 360, 90d supply, fill #0

## 2024-02-13 MED ORDER — MYCOPHENOLATE MOFETIL 250 MG CAPSULE
ORAL_CAPSULE | Freq: Two times a day (BID) | ORAL | 3 refills | 90.00000 days | Status: CP
Start: 2024-02-13 — End: ?
  Filled 2024-02-14: qty 60, 30d supply, fill #0

## 2024-02-13 NOTE — Unmapped (Signed)
 [facility] Refill Coordination Note    Specialty Medication(s) to be Shipped:   Transplant: mycophenolate  mofetil 250 mg and Prograf  0.5 mg and Specialty Lite: Mounjaro     Other medication(s) to be shipped: Gabapentin     Specialty Medications not needed at this time: Specialty Lite: Xifaxan      Richard [name], DOB: 25-May-1966  Phone: There are no phone numbers on file.      All above HIPAA information was verified with patient.     Was a Nurse, learning disability used for this call? No    Completed refill call assessment today to schedule patient's medication shipment from the [facility]  [phone]).  All relevant notes have been reviewed.     Specialty medication(s) and dose(s) confirmed: Regimen is correct and unchanged.   Changes to medications: Richard [name] reports no changes at this time.  Changes to insurance: No  New side effects reported not previously addressed with a pharmacist or physician: None reported  Questions for the pharmacist: No    Confirmed patient received a Conservation officer, historic buildings and a Surveyor, mining with first shipment. The patient will receive a drug information handout for each medication shipped and additional FDA Medication Guides as required.       DISEASE/MEDICATION-SPECIFIC INFORMATION        N/A    SPECIALTY MEDICATION ADHERENCE     Medication Adherence    Patient reported X missed doses in the last month: 0  Specialty Medication: Mycophenolate  250mg   Patient is on additional specialty medications: Yes  Additional Specialty Medications: Prograf  0.5mg   Patient Reported Additional Medication X Missed Doses in the Last Month: 0  Patient is on more than two specialty medications: No  Informant: patient  Adherence tools used: patient uses a pill box to manage medications     Were doses missed due to medication being on hold? No    Mycophenolate  250 mg: 14 days of medicine on hand   Prograf  0.5 mg: 14 days of medicine on hand       REFERRAL TO PHARMACIST     Referral to the pharmacist: Not needed      [facility]     Shipping address confirmed in Epic.     Cost and Payment: Patient has a $0 copay, payment information is not required.    Delivery Scheduled: Yes, Expected medication delivery date: 02/15/24.     Medication will be delivered via UPS to the prescription address in Epic [place].    Richard [name]   [facility]  Specialty Technician

## 2024-02-14 MED FILL — GABAPENTIN 300 MG CAPSULE: ORAL | 30 days supply | Qty: 30 | Fill #3

## 2024-02-15 NOTE — Unmapped (Signed)
 Phone call to patient regarding use of GLP-1 medication.    Patient confirmed that they are actively taking Tirzepatide (Mounjaro, Zepbound) or have taken a GLP-1 medication in the last 30 days.    Patient advised that beginning at 9 am the day before surgery they should begin a clear liquid diet consisting of water, apple or white grape juice, sodas, sports drinks (except red/purple), or black coffee.  Patient aware that they may have these liquids until 2 hours prior to the arrival time.    Educated to:   avoid broths, jello, and dairy  clear liquids are encouraged until 2 hours prior to arrival time  continue taking their GLP-1 medication as prescribed    Patient was educated that this is to reduce the chance of a preventable complication and increase safety surrounding perioperative use of these medications.    All questions were answered and patient verbalized understanding.

## 2024-02-18 MED FILL — DEXCOM G7 SENSOR DEVICE: ORAL | 30 days supply | Qty: 3 | Fill #0

## 2024-02-20 ENCOUNTER — Inpatient Hospital Stay: Admit: 2024-02-20 | Discharge: 2024-02-20 | Payer: BLUE CROSS/BLUE SHIELD

## 2024-02-20 ENCOUNTER — Encounter: Admit: 2024-02-20 | Discharge: 2024-02-20 | Payer: BLUE CROSS/BLUE SHIELD

## 2024-02-20 LAB — POTASSIUM: POTASSIUM: 4 mmol/L (ref 3.4–4.8)

## 2024-02-20 MED ORDER — OXYCODONE 5 MG TABLET
ORAL_TABLET | Freq: Four times a day (QID) | ORAL | 0 refills | 2.00000 days | Status: CP | PRN
Start: 2024-02-20 — End: ?
  Filled 2024-02-20 – 2024-02-28 (×2): qty 5, 2d supply, fill #0

## 2024-02-20 MED ADMIN — heparin (porcine) 1000 unit/mL injection: INTRAVENOUS | @ 14:00:00 | Stop: 2024-02-20

## 2024-02-20 MED ADMIN — midazolam (VERSED) injection: INTRAVENOUS | @ 13:00:00 | Stop: 2024-02-20

## 2024-02-20 MED ADMIN — glycopyrrolate (ROBINUL) injection: INTRAVENOUS | @ 14:00:00 | Stop: 2024-02-20

## 2024-02-20 MED ADMIN — Propofol (DIPRIVAN) injection: INTRAVENOUS | @ 14:00:00 | Stop: 2024-02-20

## 2024-02-20 MED ADMIN — ROCuronium (ZEMURON) injection: INTRAVENOUS | @ 14:00:00 | Stop: 2024-02-20

## 2024-02-20 MED ADMIN — sugammadex (BRIDION) injection: INTRAVENOUS | @ 15:00:00 | Stop: 2024-02-20

## 2024-02-20 MED ADMIN — fentaNYL (PF) (SUBLIMAZE) injection: INTRAVENOUS | @ 15:00:00 | Stop: 2024-02-20

## 2024-02-20 MED ADMIN — dexmedeTOMIDine (Precedex) 200 mcg in sodium chloride (NS) 0.9 % 50 mL infusion: INTRAVENOUS | @ 14:00:00 | Stop: 2024-02-20

## 2024-02-20 MED ADMIN — ondansetron (ZOFRAN) injection: INTRAVENOUS | @ 15:00:00 | Stop: 2024-02-20

## 2024-02-20 MED ADMIN — phenylephrine 1 mg/10 mL (100 mcg/mL) injection Syrg: INTRAVENOUS | @ 14:00:00 | Stop: 2024-02-20

## 2024-02-20 MED ADMIN — fentaNYL (PF) (SUBLIMAZE) injection: INTRAVENOUS | @ 14:00:00 | Stop: 2024-02-20

## 2024-02-20 MED ADMIN — lidocaine (XYLOCAINE) 10 mg/mL (1 %) injection: INTRADERMAL | @ 14:00:00 | Stop: 2024-02-20

## 2024-02-20 MED ADMIN — heparin (porcine) 5,000 Units in sodium chloride bacteriostatic 0.9 % 500 mL OR irrigation: @ 14:00:00 | Stop: 2024-02-20

## 2024-02-20 MED ADMIN — phenylephrine 20 mg in sodium chloride 0.9% 250 mL (80 mcg/mL) infusion PMB: INTRAVENOUS | @ 14:00:00 | Stop: 2024-02-20

## 2024-02-20 MED ADMIN — sodium chloride (NS) 0.9 % infusion: INTRAVENOUS | @ 13:00:00 | Stop: 2024-02-20

## 2024-02-20 MED ADMIN — HYDROmorphone (PF) (DILAUDID) injection 0.5 mg: .5 mg | INTRAVENOUS | @ 16:00:00 | Stop: 2024-02-20

## 2024-02-20 MED ADMIN — ceFAZolin (ANCEF) 3 g in sodium chloride 0.9 % (NS) 100 mL IVPB-MBP: 3 g | INTRAVENOUS | @ 14:00:00 | Stop: 2024-02-20

## 2024-02-20 MED ADMIN — lidocaine (PF) (XYLOCAINE-MPF) 20 mg/mL (2 %) injection: INTRAVENOUS | @ 14:00:00 | Stop: 2024-02-20

## 2024-02-20 NOTE — Unmapped (Addendum)
 Arteriovenous Fistula Procedure Note     Indications: ESRD on hemodialysis. Previous right upper arm cephalic vein fistula is occluded    Pre-operative Diagnosis:    ESRD on hemodialysis     Post-operative Diagnosis: same.     Operation: Creation of right brachio-basilic arteriovenous fistula as the first stage of a planned 2 stage transposition procedure, using the VAS-Q device     Surgeon: Oliva Sprinkles, MD      Assistants: Rudolpho Galli, PA     Anesthesia: General Endotracheal     ASA Class: 4     Procedure Details   The patient was seen in the Holding Room. The risks, benefits, complications, treatment options, and expected outcomes were discussed with the patient. The patient concurred with the proposed plan, giving informed consent.  The site of surgery properly noted/marked. The procedure verified as AV Fistula creation of the right arm. A Time Out was held and the above information confirmed.     The patient was brought to the Operating Room. The right arm prepped and draped in a sterile fashion. The block was tested and had set up well. An initial skin incision was made Transversely at the antecubital fossa. A 3.6 mm antecubital tributary was identified, encircled, and mobilized. The brachial artery was then isolated in the brachial sheath. The artery is a large, 6 mm vessel.   5000 units of heparin  was administered intravenously by anesthesia. The vein was ligated distally and flushed easily with heparinized saline. It sounded to a 3.5 mm dilator. The VAS-Q sizer suggested that the 6 was the best size and it was opened on the field. It was placed over the mobilized vein. An end-to-side.anastomosis between the antecubital vein and the brachial artery was created using 7-0 Prolene sutures in a continuous running manner. After completion of the anastomosis, the clamps  were released. Hemostasis was secured. A brisk thrill was noted in the fistula. The VAS-Q device was then advanced over anastomosis and the artery and was secured by placing a 6-0 Prolene through the eyelets,  approximating them posteriorly, and tying down to secure.  The thrill persisted and the vein has a smooth curvilinear course.  The wound was made hemostatic and  the incision was then closed in two layers, deep dermal with  interrupted 3-0 Vicryl and skin with 4-0 Monocryl. Steri-Strips were applied.     Findings:   Thrill present at the end of the procedure     Estimate Blood Loss:  5 ml           Drains: none           Total IV Fluids: 100 ml           Specimens: None           Implants: none           Complications:  None; patient tolerated the procedure well.           Disposition: PACU - hemodynamically stable.           Condition: stable     Attending Attestation: I was present and scrubbed for the entire procedure, except skin closure

## 2024-02-21 DIAGNOSIS — N186 End stage renal disease: Principal | ICD-10-CM

## 2024-02-21 DIAGNOSIS — Z992 Dependence on renal dialysis: Principal | ICD-10-CM

## 2024-02-21 MED ORDER — OXYCODONE 5 MG TABLET
ORAL_TABLET | Freq: Four times a day (QID) | ORAL | 0 refills | 2.00000 days | Status: CP | PRN
Start: 2024-02-21 — End: ?

## 2024-02-21 NOTE — Unmapped (Signed)
 Patient called in today. He had surgery yesterday. He is complaining of pain and reporting that he is out of pain medication. He is requesting a refill.   He states that he has tried Tylenol  but that his arm is still killing him.    I spoke with Dr Marchelle who said that it was OK to send in refill X 1, but that this would be the last refill. Patient is aware of this and verbalizes understanding.

## 2024-02-23 DIAGNOSIS — I4819 Other persistent atrial fibrillation: Principal | ICD-10-CM

## 2024-02-23 MED ORDER — APIXABAN 5 MG TABLET
ORAL_TABLET | Freq: Two times a day (BID) | ORAL | 4 refills | 90.00000 days | Status: CP
Start: 2024-02-23 — End: 2025-05-18
  Filled 2024-03-19: qty 180, 90d supply, fill #0

## 2024-02-26 ENCOUNTER — Emergency Department
Admit: 2024-02-26 | Discharge: 2024-02-26 | Disposition: A | Discharge status: Home / Self Care | Payer: BLUE CROSS/BLUE SHIELD | Attending: Emergency Medicine

## 2024-02-26 DIAGNOSIS — G8918 Other acute postprocedural pain: Principal | ICD-10-CM

## 2024-02-26 LAB — CBC W/ AUTO DIFF
BASOPHILS ABSOLUTE COUNT: 0.1 10*9/L (ref 0.0–0.1)
BASOPHILS RELATIVE PERCENT: 1 %
EOSINOPHILS ABSOLUTE COUNT: 0.2 10*9/L (ref 0.0–0.5)
EOSINOPHILS RELATIVE PERCENT: 4.1 %
HEMATOCRIT: 37.7 % — ABNORMAL LOW (ref 39.0–48.0)
HEMOGLOBIN: 12.5 g/dL — ABNORMAL LOW (ref 12.9–16.5)
LYMPHOCYTES ABSOLUTE COUNT: 0.6 10*9/L — ABNORMAL LOW (ref 1.1–3.6)
LYMPHOCYTES RELATIVE PERCENT: 11.3 %
MEAN CORPUSCULAR HEMOGLOBIN CONC: 33.1 g/dL (ref 32.0–36.0)
MEAN CORPUSCULAR HEMOGLOBIN: 28.6 pg (ref 25.9–32.4)
MEAN CORPUSCULAR VOLUME: 86.4 fL (ref 77.6–95.7)
MEAN PLATELET VOLUME: 8.1 fL (ref 6.8–10.7)
MONOCYTES ABSOLUTE COUNT: 0.5 10*9/L (ref 0.3–0.8)
MONOCYTES RELATIVE PERCENT: 8 %
NEUTROPHILS ABSOLUTE COUNT: 4.3 10*9/L (ref 1.8–7.8)
NEUTROPHILS RELATIVE PERCENT: 75.6 %
NUCLEATED RED BLOOD CELLS: 0 /100{WBCs} (ref <=4)
PLATELET COUNT: 174 10*9/L (ref 150–450)
RED BLOOD CELL COUNT: 4.36 10*12/L (ref 4.26–5.60)
RED CELL DISTRIBUTION WIDTH: 13.9 % (ref 12.2–15.2)
WBC ADJUSTED: 5.7 10*9/L (ref 3.6–11.2)

## 2024-02-26 LAB — COMPREHENSIVE METABOLIC PANEL
ALBUMIN: 3.7 g/dL (ref 3.4–5.0)
ALKALINE PHOSPHATASE: 168 U/L — ABNORMAL HIGH (ref 46–116)
ALT (SGPT): <7 U/L — ABNORMAL LOW (ref 10–49)
ANION GAP: 17 mmol/L — ABNORMAL HIGH (ref 5–14)
AST (SGOT): 26 U/L (ref <=34)
BILIRUBIN TOTAL: 0.7 mg/dL (ref 0.3–1.2)
BLOOD UREA NITROGEN: 14 mg/dL (ref 9–23)
BUN / CREAT RATIO: 3
CALCIUM: 8.3 mg/dL — ABNORMAL LOW (ref 8.7–10.4)
CHLORIDE: 95 mmol/L — ABNORMAL LOW (ref 98–107)
CO2: 25.4 mmol/L (ref 20.0–31.0)
CREATININE: 5.24 mg/dL — ABNORMAL HIGH (ref 0.73–1.18)
EGFR CKD-EPI (2021) MALE: 12 mL/min/1.73m2 — ABNORMAL LOW (ref >=60)
GLUCOSE RANDOM: 261 mg/dL — ABNORMAL HIGH (ref 70–179)
POTASSIUM: 5.2 mmol/L — ABNORMAL HIGH (ref 3.4–4.8)
PROTEIN TOTAL: 7.6 g/dL (ref 5.7–8.2)
SODIUM: 137 mmol/L (ref 135–145)

## 2024-02-26 LAB — LACTATE, VENOUS, WHOLE BLOOD: LACTATE BLOOD VENOUS: 2.7 mmol/L — ABNORMAL HIGH (ref 0.5–1.8)

## 2024-02-26 MED ADMIN — morphine 4 mg/mL injection 4 mg: 4 mg | INTRAVENOUS | @ 19:00:00 | Stop: 2024-02-26

## 2024-02-26 NOTE — Unmapped (Signed)
 Patient in clinic today to have a consultation for splenic artery aneurysm [facility] Provider MD/APP: Dr Gretta           VSS. Alert and Oriented x4. Pain 8/10 RUE  Allergies and home medications reviewed and documented.   Consent available at bedside if patient chooses to go forward with procedure presented by [facility] provider MD/APP, consent will be verified and signed by RN, scanned to patient chart.   RN provided education and reading materials to patient. No further questions or concerns at this time. RN offered clinic number for patient contact.   AVS offered.

## 2024-02-26 NOTE — Unmapped (Signed)
 Patient arrived to VIR clinic for scheduled appointment. Reported redness and yellow smelly drainage with severe 8/10 pain from right upper extremity at surgical site. Of note, patient states he just had a RUE fistula creation on 8/27. The patient denies fever or chills. Patient was advised to go to the ER for evaluation.     Patient agreeable and heading directly to ER.

## 2024-02-26 NOTE — Unmapped (Signed)
 [facility]  Emergency Department Provider Note     ED Clinical Impression     Final diagnoses:   Pain at surgical site (Primary)      HPI, Medical Decision Making, ED Course     HPI: 58 y.o. 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 The patient reports progressively worsening right arm pain/swelling around the fistula site that acutely worsened this morning that is also malodorous in the setting of a recent fistula placement 6 days ago (02/20/2024). He also states the area is malodorous and has also been having generalized fatigue and intermittent chills. Of note, patient states he was at a doctor's appointment earlier today who saw the area and advised the patient to come to the ED. For pain management, he has taken Tylenol  and prescribed oxycodone  up until yesterday however notes he has had no relief. Additionally, patient states he last received dialysis earlier today via his port. He does not produce his own urine. Denies chest pain, shortness of breath, or fevers.    On exam, the patient is non-toxic appearing and in no acute distress. Vital signs are overall reassuring and within normal limits. Physical exam significant for swelling from the surgical site at the antecubital fossa of the right arm past the elbow and down to the fingertips. Decreased strength of the right arm with decreased grip strength. Core sensation distal to the elbow. Remainder of the forearm did not seem hard or firm. Normal pulses in the right arm.    DDx/MDM: This is a 58 year old male who is ESRD on dialysis who presents for severe pain in the right arm roughly 1 week after graft surgery.  Patient has good pulses and soft compartments on exam but does endorse paresthesias and severe pain.  Reassuringly his pain resolved significantly with morphine . Differential diagnosis includes pseudoaneurysm versus compartment syndrome versus wound site infection.  No signs of systemic infection including chills or fever.  Patient not taking any antipyretics today.  Patient did receive dialysis today and there is no indication of volume overload or other problem requiring acute dialysis.    Diagnostic workup as below. Will treat patient with morphine .    Orders Placed This Encounter   Procedures    Comprehensive Metabolic Panel    CBC w/ Differential    Lactic Acid, Venous, Whole Blood    ECG 12 Lead       ED Course  ED Course as of 02/26/24 1714   Tue Feb 26, 2024   1509 WBC: 5.7  Reassuring against systemic infection   1625 Lactate, Venous(!): 2.7  Likely 2/2 inability to clear lactate   1626 Patient was evaluated by Dr. Marchelle who performed the surgery and he feels as though the surgical site is healing well and does not require any further evaluation.         Independent Interpretation of Studies: If applicable, documented in ED course above.  I have reviewed recent and relevant previous record, including: Outpatient notes - 02/05/2024 General Surgery Office Visit Note for past medical history.    Social Determinants that significantly affected care: None Available        Additional History Elements     Chief Complaint  Chief Complaint   Patient presents with    Arm Pain    Post-op Problem     Additional Historian(s):  none available    Past Medical History[1]    Past Surgical History[2]    Allergies  Patient has no known allergies.    Family History  Family History[3]    Social History  Short Social History[4]     Physical Exam     VITAL SIGNS:      Vitals:    02/26/24 1432 02/26/24 1646   BP: 113/45 137/74   Pulse: 62 58   Resp: 18 19   Temp: 36.4 ??C (97.5 ??F) 36.6 ??C (97.8 ??F)   TempSrc: Oral Oral   SpO2: 98% 99%   Weight: (!) 118.8 kg (262 lb)    Height: 177.8 cm (5' 10)      Constitutional: Alert and oriented. No acute distress.  Eyes: Conjunctivae are normal.  HEENT: Normocephalic and atraumatic. Conjunctivae clear. No congestion. Moist mucous membranes.   Cardiovascular: Rate as above, regular rhythm. Normal and symmetric distal pulses. Brisk capillary refill. Normal skin turgor.  Respiratory: Normal respiratory effort. Breath sounds are normal. There are no wheezing or crackles heard.  Gastrointestinal: Soft, non-distended, non-tender.  Genitourinary: Deferred.  Musculoskeletal: Swelling from the surgical site at the antecubital fossa of the right arm past the elbow and down to the fingertips. Decreased strength of the right arm with normal grip strength. Core sensation distal to the elbow. Remainder of the forearm did not seem hard or firm. Normal pulses in the right arm.  Neurologic: Normal speech and language. No gross focal neurologic deficits are appreciated. Patient is moving all extremities equally, face is symmetric at rest and with speech.  Skin: Skin is warm, dry and intact. No rash noted.  Psychiatric: Mood and affect are normal. Speech and behavior are normal.          Radiology     No orders to display       Pertinent labs & imaging results that were available during my care of the patient were independently interpreted by me and considered in my medical decision making (see chart for details).    Portions of this record have been created using Scientist, clinical (histocompatibility and immunogenetics). Dictation errors have been sought, but may not have been identified and corrected.    Documentation assistance was provided by Almarie Crawley, Scribe, on February 26, 2024 at 3:24 PM for Rockey Ruddle, MD.    February 26, 2024 5:13 PM. Documentation assistance provided by the scribe. I was present during the time the encounter was recorded. The information recorded by the scribe was done at my direction and has been reviewed and validated by me.           [1]   Past Medical History:  Diagnosis Date    CHF (congestive heart failure)    (CMS-HCC)     COVID-19 07/18/2019    Diabetes mellitus    (CMS-HCC)     Family history of malignant neoplasm of prostate 06/23/2015    Heart disease     Heart murmur     Hepatic cirrhosis    (CMS-HCC) 06/23/2015    Overview:  Secondary to NASH; followed by Dr. Lindaann, GI.  Last Assessment & Plan:  Relevant Hx: Course: Daily Update: Today's Plan:     Hypertension     Hypogonadism in male 02/12/2014    Kidney stone     Liver cirrhosis secondary to NASH (nonalcoholic steatohepatitis)    (CMS-HCC)     s/p liver transplant 2013    Obesity    [2]   Past Surgical History:  Procedure Laterality Date    CHOLECYSTECTOMY      liver transpant      LIVER TRANSPLANTATION  06/27/2011    PR ANASTOMOSIS,AV,ANY SITE Right 02/20/2024    Procedure: ARTERIOVENOUS ANASTOMOSIS, OPEN; DIRECT, ANY SITE, UPPER EXTREMITY;  Surgeon: Marchelle Kitchens, MD;  [facility]: [facility];  Service: General Surgery    PR AV FIST REVISE GRFT,W THROMBECTOMY Right 01/15/2023    Procedure: REVISION, ARTERIOVENOUS FISTULA W/ THROMBECTOMY, AUTOGENOUS OR NONAUTOGENOUS DIALYSIS GRAFT (SEP. PROC), LOWER EXTREMITY;  Surgeon: Marchelle Kitchens, MD;  Location: [facility];  Service: General Surgery    PR COLONOSCOPY FLX DX W/COLLJ SPEC WHEN PFRMD N/A 01/24/2024    Procedure: COLONOSCOPY, FLEXIBLE, PROXIMAL TO SPLENIC FLEXURE; DIAGNOSTIC, W/WO COLLECTION SPECIMEN BY BRUSH OR WASH;  Surgeon: Erick Prentice HERO, MD;  Location: [facility];  Service: Gastroenterology    PR COLONOSCOPY W/BIOPSY SINGLE/MULTIPLE N/A 08/13/2018    Procedure: COLONOSCOPY, FLEXIBLE, PROXIMAL TO SPLENIC FLEXURE; WITH BIOPSY, SINGLE OR MULTIPLE;  Surgeon: Thedora Alm Plain, MD;  Location: GI [facility]URES [facility];  Service: Gastroenterology    PR COLSC FLX W/RMVL OF TUMOR POLYP LESION SNARE TQ N/A 08/13/2018    Procedure: COLONOSCOPY FLEX; W/REMOV TUMOR/LES BY SNARE;  Surgeon: Thedora Alm Plain, MD;  Location: GI [facility];  Service: Gastroenterology    PR CREAT AV FISTULA,NON-AUTOGENOUS GRAFT Right 11/29/2022    Procedure: CREATE AV FISTULA (SEPARATE PROC); NONAUTOGENOUS GRAFT (EG, BIOLOGICAL COLLAGEN, THERMOPLASTIC GRAFT), UPPER EXTREMITY;  Surgeon: Marchelle Kitchens, MD;  Location: [facility] OR [facility];  Service: General Surgery    PR UPPER GI ENDOSCOPY,BIOPSY N/A 07/13/2015    Procedure: UGI ENDOSCOPY; WITH BIOPSY, SINGLE OR MULTIPLE;  Surgeon: Elspeth Jerilynn Reek, MD;  Location: GI [facility];  Service: Gastroenterology    PR UPPER GI ENDOSCOPY,BIOPSY N/A 02/13/2023    Procedure: UGI ENDOSCOPY; WITH BIOPSY, SINGLE OR MULTIPLE;  Surgeon: Minnie Krystal Claude, MD;  Location: GI [facility]URES [facility];  Service: Gastroenterology    PR UPPER GI ENDOSCOPY,DIAGNOSIS N/A 01/24/2024    Procedure: UGI ENDO, INCLUDE ESOPHAGUS, STOMACH, & DUODENUM &/OR JEJUNUM; DX W/WO COLLECTION SPECIMN, BY BRUSH OR WASH;  Surgeon: Erick Prentice HERO, MD;  Location: GI [facility];  Service: Gastroenterology   [3]   Family History  Problem Relation Age of Onset    Diabetes Father     Diabetes Paternal Grandfather     Liver disease Maternal Uncle     Cancer Maternal Grandmother     Melanoma Neg Hx     Basal cell carcinoma Neg Hx     Squamous cell carcinoma Neg Hx     Kidney disease Neg Hx    [4]   Social History  Tobacco Use    Smoking status: Never     Passive exposure: Past    Smokeless tobacco: Never   Vaping Use    Vaping status: Never Used   Substance Use Topics    Alcohol use: No    Drug use: No        Honore Rockey LABOR, MD  Resident  02/26/24 [phone]

## 2024-02-26 NOTE — Unmapped (Addendum)
 Patient had fistula placed to right arm 1 week ago and is having gradually worsening pain/swelling. Reports foul odor to same and is concerned for infection.

## 2024-02-26 NOTE — Unmapped (Signed)
 Pt alert and oriented x 4. Pt was somewhat recently given morphine ; Pt was in this context told not to drive. Pt states he does not have a ride home, but that he would call an uber. It was belabored to the pt not to drive, pt verbalized understanding.    AVS reviewed pt with no further questions at this time.     IV removed by NA, vitals taken by NA

## 2024-03-10 NOTE — Unmapped (Addendum)
 [facility] Refill Coordination Note    Specialty Medication(s) to be Shipped:   Transplant: mycophenolate  mofetil 250 mg and tacrolimus  0.5 mg    Other medication(s) to be shipped: Ursidiol ,  Gabapentin   ,  Hydroxyzine       Specialty Medications not needed at this time: N/A     Richard [name], DOB: Jun 22, 1966  Phone: There are no phone numbers on file.      All above HIPAA information was verified with patient.     Was a Nurse, learning disability used for this call? No    Completed refill call assessment today to schedule patient's medication shipment from the [facility]  [phone]).  All relevant notes have been reviewed.     Specialty medication(s) and dose(s) confirmed: Regimen is correct and unchanged.   Changes to medications: Benjaman reports no changes at this time.  Changes to insurance: No  New side effects reported not previously addressed with a pharmacist or physician: None reported  Questions for the pharmacist: No    Confirmed patient received a Conservation officer, historic buildings and a Surveyor, mining with first shipment. The patient will receive a drug information handout for each medication shipped and additional FDA Medication Guides as required.       DISEASE/MEDICATION-SPECIFIC INFORMATION        N/A    SPECIALTY MEDICATION ADHERENCE     Medication Adherence    Patient reported X missed doses in the last month: 0  Specialty Medication: tacrolimus : PROGRAF  0.5 mg capsule  Patient is on additional specialty medications: Yes  Additional Specialty Medications: mycophenolate  250 mg capsule (CELLCEPT )  Patient Reported Additional Medication X Missed Doses in the Last Month: 0  Patient is on more than two specialty medications: No  Any gaps in refill history greater than 2 weeks in the last 3 months: no  Demonstrates understanding of importance of adherence: yes  Adherence tools used: patient uses a pill box to manage medications              Were doses missed due to medication being on hold? No      mycophenolate  250 mg capsule (CELLCEPT )  : 2 days of medicine on hand     tacrolimus : PROGRAF  0.5 mg capsule: 2 days of medicine on hand      rifAXIMin  550 mg Tab (XIFAXAN ) :  2 days of medicine on hand        REFERRAL TO PHARMACIST     Referral to the pharmacist: Not needed      SHIPPING     Shipping address confirmed in Epic.     Cost and Payment: Patient has a $0 copay, payment information is not required.    Delivery Scheduled: Yes, Expected medication delivery date: 03/12/24 .     Medication will be delivered via UPS to the prescription address in Epic WAM.    Tawni Daring   [facility]  Specialty Technician

## 2024-03-11 ENCOUNTER — Inpatient Hospital Stay: Admit: 2024-03-11 | Discharge: 2024-03-11 | Payer: BLUE CROSS/BLUE SHIELD

## 2024-03-11 MED FILL — PROGRAF 0.5 MG CAPSULE: ORAL | 30 days supply | Qty: 120 | Fill #1

## 2024-03-11 MED FILL — MYCOPHENOLATE MOFETIL 250 MG CAPSULE: ORAL | 30 days supply | Qty: 60 | Fill #1

## 2024-03-11 MED FILL — GABAPENTIN 300 MG CAPSULE: ORAL | 30 days supply | Qty: 30 | Fill #4

## 2024-03-11 NOTE — Unmapped (Addendum)
 [facility] ACUTE CARE AND GENERAL SURGERY FOLLOW-UP CLINIC NOTE    Patient Name: Richard [name]  Medical Record Number: [id number]  Date of Service: 03/11/2024    Reason for Visit: Follow up s/p right brachiobasilic arteriovenous fistula 02/20/24    HPI: Richard [name] is a 58 y.o. 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 He presented to the ED on POD 6 due to pain at the site with no concerning findings and was discharged after receiving pain medication.     Today patient is reporting continued significant pain with clear drainage from the site. He denies any fevers at home.     Reivew of Systems:  Negative except otherwise noted in the HPI.    Physical Exam:  Vitals:    03/11/24 1015   BP: 132/60   Pulse: 54   Temp: 36.6 ??C (97.9 ??F)   SpO2: 98%       General: In no acute distress.  Neuro: Awake and alert.  Lungs: Normal work of breathing on room air.  RUE: AVF site incision intact with no active drainage and mild skin maceration. Significant swelling. No pulsatile flow upon palpation of swollen area. Brisk, palpable thrill superior to site. Radial pulse palpable. Compartments soft.     Assessment and Plan: Richard [name] is a 58 y.o. male with ESRD s/p right brachiobasilic arteriovenous fistula 02/20/24 as part of a two-stage transposition. He is endorsing continued pain at the site. On exam there is marked swelling that is likely a seroma as opposed to a pseudoaneurysm as there is no pulsatility. An ultrasound was obtained which indicated a seroma. Needle aspiration of the site was performed, drawing out about 40 ml of crystal clear straw-colored serous fluid.    - Will schedule second stage of procedure  - Patient to call if any issues arise

## 2024-03-19 MED FILL — DEXCOM G7 SENSOR DEVICE: ORAL | 30 days supply | Qty: 3 | Fill #1

## 2024-03-26 NOTE — Unmapped (Signed)
 Phone call to patient regarding use of GLP-1 medication.    Patient confirmed that they are actively taking Tirzepatide (Mounjaro, Zepbound) or have taken a GLP-1 medication in the last 30 days.    Patient advised that beginning at 9 am the day before surgery they should begin a clear liquid diet consisting of water, apple or white grape juice, sodas, sports drinks (except red/purple), or black coffee.  Patient aware that they may have these liquids until 2 hours prior to the arrival time.    Educated to:   avoid broths, jello, and dairy  clear liquids are encouraged until 2 hours prior to arrival time  continue taking their GLP-1 medication as prescribed    Patient was educated that this is to reduce the chance of a preventable complication and increase safety surrounding perioperative use of these medications.    All questions were answered and patient verbalized understanding.

## 2024-03-27 NOTE — Unmapped (Unsigned)
 [facility]  04/01/2024    Reason for visit: {New/Return/Post:94740}    Assessment/Plan:    58 y.o. 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 No prior diagnosis of rejection or biliary issues.     #S/p OLT 2013 ([name] Cirrhosis): Transplant in 2013 for [name] cirrhosis. No history of rejection or biliary issues, however has developed evidence of portosystemic shunting with no fibrosis on biopsy and no elevated HVPG (see below). CMV D-/R-  - Currenly on tac 1mg  BID (goal 2-4) and MMF 250 BID  - Recent levels from hospital were variable, so would like to obtain a good trough prior to any dose changes  - Suspect elevated ALP could be bone-related or from chronic congestion, GGT today  - Can stop ursodiol , suspect ALP elevation is from congestion (features of this on biopsy), and not PBC (while AMA +, no prior history of PBC, and so do not think there is benefit here).   - Discuss importance of local PCP and monthly labs    #Portosystemic shunting w/episodes of HE (and truly elevated ammonia): Highly perplexing presentation with varices on EGD, multiple PSS, and true episodes of HE responsive to lactulose  (ammonia elevated during these episodes as well for what it is worth), but no fibrosis on liver biopsy 01/11/2024 and normal HVPG. Wonder about SFSS (graft appears small on imaging), and while this usually presents w/ascites, if developed extensive shunting this may not develop. On last hospitalization, I reviewed his imaging with VIR and shunt embolization was going to be difficult, so discussion around splenic artery embolization, but ultimately decided against it as the thought was this may be further optimized with more appropriate dialysis (mental status may have been impacted by uremia as well).   - Lactulose /rifaximin : goal 3-5 BM/day    #Portal Hypertension: Portal hypertension without evidence of cirrhosis on biopsy (see above re thoughts on PSS/portal HTN). As such, would recommend EV surveillance, but not HCC screening. Liver biopsy 12/2023 with e/o congestion, NO fibrosis, no e/o PBC, and no rejection.  - EV: EGD 12/2023 with G1 varices. Repeat in 1 year given uncertain underlying pathology, and cannot tolerate coreg ***    #ESRD: Was evaluated for kidney transplant, but evaluation was closed in s/o sub-optimally controlled diabetes and comorbidities (HFpEF as well), but may be able to revisit if optimizes. It does appear he's lost weight (BMI 37 from 40)    #Splenic Artery Pseudoaneurysm: has appointment with Dr. Gretta 04/01/2024 to discuss embolization    #Preventive Hepatology Care:  - Hepatitis A Vaccine: second dose after December 2025  - Hepatitis B Vaccine: Second dose heplisav today  - S/p PCV 21, shingrix , Tdap (all UTD)  - Flu shot today  - COVID ***  - Colonoscopy: 12/2023, due for repeat in 7 years  - Skin exams/skin cancer: **    No follow-ups on file.    Vena Lowers, MD  Transplant Hepatology  Assistant Professor of Medicine  [facility]    Subjective   History of Present Illness   {Accompanied [phone]    58 y.o. 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    History of Present Illness      ***Hospitalizations    ***Tobacco use    ***  Alcohol use    Objective   Physical Exam   Vital Signs: There were no vitals taken for this visit.  Constitutional: He is in no apparent distress  Eyes: Anicteric sclerae  Cardiovascular: No peripheral edema  Gastrointestinal: Soft, nontender abdomen without hepatosplenomegaly, hernias, or masses  Neurologic: Awake, alert, and oriented to person, place, and time with normal speech and no asterixis    {Labs/Imaging (Optional):104318}    {Optional documentation elements (Optional):94651}

## 2024-03-28 MED ORDER — FUROSEMIDE 20 MG TABLET
ORAL_TABLET | Freq: Every day | ORAL | 11 refills | 30.00000 days | Status: CN
Start: 2024-03-28 — End: 2025-03-28

## 2024-03-28 NOTE — Unmapped (Addendum)
 Call placed to patient in preparation for visit with Dr. Charlett next week.    Reviewed and updated medication list in EPIC - patient notes he is no longer taking amlodipine , lipitor , pantoprazole  and has been off of xifaxin and lactulose  for >1 month.      In regards to stopping lactulose  he notes he is now clear with no confusion after recent resolution of dialysis fistula.  He said he wasn't able to get xifaxin from pharmacy so stopped (confirmed he has script on file and PA approved for filling thru [facility]).  Confirmed will let provider discuss these meds further at next week's visit and encouraged him in the future to reach out to coordinator with any concerns about being unable to get ordered meds.      In regards to ursodiol  he originally said he was not taking but discussed 90 day supply shipped from [facility] on 9/16 - he will review meds and confirm with provider next week.    Does not have current PCP - encouraged him to establish this locally.    He reports he is clear as a tack now and no ongoing issues with confusion/fogginess he attributes to fistula interventions making dialysis more effective.    Discussed need for monthly labs - he has not had any completed since discharge - he will have these collected @ [facility] prior to visit next week and then once monthly @ [facility]      Confirms mediations are affordable.

## 2024-03-31 ENCOUNTER — Ambulatory Visit
Admission: RE | Admit: 2024-03-31 | Discharge: 2024-04-02 | Disposition: A | Discharge status: Home / Self Care | Payer: BLUE CROSS/BLUE SHIELD

## 2024-03-31 ENCOUNTER — Encounter
Admission: RE | Admit: 2024-03-31 | Discharge: 2024-04-02 | Disposition: A | Discharge status: Home / Self Care | Payer: BLUE CROSS/BLUE SHIELD | Attending: Anesthesiology | Primary: Anesthesiology

## 2024-03-31 ENCOUNTER — Inpatient Hospital Stay
Admission: RE | Admit: 2024-03-31 | Discharge: 2024-04-02 | Disposition: A | Discharge status: Home / Self Care | Payer: BLUE CROSS/BLUE SHIELD

## 2024-03-31 LAB — POTASSIUM: POTASSIUM: 4.3 mmol/L (ref 3.4–4.8)

## 2024-03-31 MED ORDER — OXYCODONE 5 MG TABLET
ORAL_TABLET | Freq: Four times a day (QID) | ORAL | 0 refills | 3.00000 days | Status: CP | PRN
Start: 2024-03-31 — End: 2024-04-05
  Filled 2024-04-02: qty 10, 3d supply, fill #0

## 2024-03-31 MED ADMIN — ondansetron (ZOFRAN) injection: INTRAVENOUS | @ 12:00:00 | Stop: 2024-03-31

## 2024-03-31 MED ADMIN — HYDROmorphone (PF) (DILAUDID) injection 0.2 mg: .2 mg | INTRAVENOUS | @ 16:00:00 | Stop: 2024-03-31

## 2024-03-31 MED ADMIN — dexmedeTOMIDine (Precedex) 200 mcg in sodium chloride (NS) 0.9 % 50 mL infusion: INTRAVENOUS | @ 13:00:00 | Stop: 2024-03-31

## 2024-03-31 MED ADMIN — mycophenolate (CELLCEPT) capsule 250 mg: 250 mg | ORAL

## 2024-03-31 MED ADMIN — Propofol (DIPRIVAN) injection: INTRAVENOUS | @ 12:00:00 | Stop: 2024-03-31

## 2024-03-31 MED ADMIN — oxyCODONE (ROXICODONE) immediate release tablet 5 mg: 5 mg | ORAL | @ 15:00:00 | Stop: 2024-03-31

## 2024-03-31 MED ADMIN — fentaNYL (PF) (SUBLIMAZE) injection 25 mcg: 25 ug | INTRAVENOUS | @ 15:00:00 | Stop: 2024-03-31

## 2024-03-31 MED ADMIN — fentaNYL (PF) (SUBLIMAZE) injection: INTRAVENOUS | @ 12:00:00 | Stop: 2024-03-31

## 2024-03-31 MED ADMIN — midazolam (VERSED) injection: INTRAVENOUS | @ 12:00:00 | Stop: 2024-03-31

## 2024-03-31 MED ADMIN — heparin (porcine) 50,000 Units in sodium chloride irrigation (NS) 500 mL OR irrigation: @ 12:00:00 | Stop: 2024-03-31

## 2024-03-31 MED ADMIN — dexAMETHasone (DECADRON) 4 mg/mL injection: INTRAVENOUS | @ 12:00:00 | Stop: 2024-03-31

## 2024-03-31 MED ADMIN — phenylephrine 1 mg/10 mL (100 mcg/mL) injection Syrg: INTRAVENOUS | @ 14:00:00 | Stop: 2024-03-31

## 2024-03-31 MED ADMIN — phenylephrine 1 mg/10 mL (100 mcg/mL) injection Syrg: INTRAVENOUS | @ 13:00:00 | Stop: 2024-03-31

## 2024-03-31 MED ADMIN — ceFAZolin (ANCEF) IVPB 2 g in 50 ml dextrose (premix): 2 g | INTRAVENOUS | @ 12:00:00 | Stop: 2024-03-31

## 2024-03-31 MED ADMIN — prochlorperazine (COMPAZINE) injection 5 mg: 5 mg | INTRAVENOUS | @ 16:00:00 | Stop: 2024-03-31

## 2024-03-31 MED ADMIN — fentaNYL (PF) (SUBLIMAZE) injection 25 mcg: 25 ug | INTRAVENOUS | @ 16:00:00 | Stop: 2024-03-31

## 2024-03-31 MED ADMIN — succinylcholine (ANECTINE) injection: INTRAVENOUS | @ 12:00:00 | Stop: 2024-03-31

## 2024-03-31 MED ADMIN — heparin (porcine) 5,000 unit/mL injection 5,000 Units: 5000 [IU] | SUBCUTANEOUS | @ 19:00:00

## 2024-03-31 MED ADMIN — phenylephrine 1 mg/10 mL (100 mcg/mL) injection Syrg: INTRAVENOUS | @ 12:00:00 | Stop: 2024-03-31

## 2024-03-31 MED ADMIN — insulin lispro (HumaLOG) injection 35 Units: 35 [IU] | SUBCUTANEOUS | @ 22:00:00

## 2024-03-31 MED ADMIN — fentaNYL (PF) (SUBLIMAZE) injection: INTRAVENOUS | @ 13:00:00 | Stop: 2024-03-31

## 2024-03-31 MED ADMIN — HYDROmorphone (PF) (DILAUDID) injection 1 mg: 1 mg | INTRAVENOUS | @ 22:00:00 | Stop: 2024-03-31

## 2024-03-31 MED ADMIN — ePHEDrine (PF) 25 mg/5 mL (5 mg/mL) in 0.9% sodium chloride syringe: INTRAVENOUS | @ 13:00:00 | Stop: 2024-03-31

## 2024-03-31 MED ADMIN — ketamine (KETALAR) injection: INTRAVENOUS | @ 12:00:00 | Stop: 2024-03-31

## 2024-03-31 MED ADMIN — insulin lispro (HumaLOG) injection CORRECTIONAL 0-20 Units: 0-20 [IU] | SUBCUTANEOUS | @ 21:00:00 | Stop: 2024-03-31

## 2024-03-31 MED ADMIN — oxyCODONE (ROXICODONE) immediate release tablet 10 mg: 10 mg | ORAL | Stop: 2024-04-07

## 2024-03-31 MED ADMIN — glycopyrrolate (ROBINUL) injection: INTRAVENOUS | @ 12:00:00 | Stop: 2024-03-31

## 2024-03-31 MED ADMIN — oxyCODONE (ROXICODONE) immediate release tablet 10 mg: 10 mg | ORAL | @ 19:00:00 | Stop: 2024-04-07

## 2024-03-31 MED ADMIN — sodium chloride (NS) 0.9 % infusion: INTRAVENOUS | @ 12:00:00 | Stop: 2024-03-31

## 2024-03-31 MED ADMIN — Propofol (DIPRIVAN) injection: INTRAVENOUS | @ 13:00:00 | Stop: 2024-03-31

## 2024-03-31 MED ADMIN — lidocaine (PF) (XYLOCAINE-MPF) 20 mg/mL (2 %) injection: INTRAVENOUS | @ 12:00:00 | Stop: 2024-03-31

## 2024-03-31 MED ADMIN — tacrolimus (PROGRAF) capsule 1 mg: 1 mg | ORAL

## 2024-03-31 NOTE — Unmapped (Signed)
 Case Management Brief Assessment      General:  Care Manager / Social Worker assessed the patient by : Medical record review, Discussion with Clinical Care team    Extended Emergency Contact Information  Primary Emergency Contact: [facility]  Address: [address]           [place], [place] [address] [place]  Work Phone: [phone]  Mobile Phone: [phone]  Relation: Sister  Secondary Emergency Contact: Mahlon Wilfred Boone  Relation: Spouse  Interpreter needed? Yes      Financial Information:  Need for financial assistance?: No         Discharge Needs:  Concerns to be Addressed: discharge planning    Clinical risk factors: Dialysis      Discharge Plan:  Screen findings are: Discharge planning needs identified or anticipated (Comment).    Estimated Discharge Date: 03/31/2024    Initial Assessment complete?: Yes        Additional Information:    HCDM (patient stated preference): [name] - Sister - [phone]    Social Drivers of Health     Food Insecurity: No Food Insecurity (01/18/2024)    Hunger Vital Sign     Worried About Running Out of Food in the Last Year: Never true     Ran Out of Food in the Last Year: Never true   Tobacco Use: Low Risk  (03/31/2024)    Patient History     Smoking Tobacco Use: Never     Smokeless Tobacco Use: Never     Passive Exposure: Past   Recent Concern: Tobacco Use - Medium Risk (01/15/2024)    Patient History     Smoking Tobacco Use: Former     Smokeless Tobacco Use: Never     Passive Exposure: Past   Transportation Needs: No Transportation Needs (01/18/2024)    PRAPARE - Therapist, art (Medical): No     Lack of Transportation (Non-Medical): No   Alcohol Use: Not At Risk (03/31/2024)    Alcohol Use     How often do you have a drink containing alcohol?: Never     How many drinks containing alcohol do you have on a typical day when you are drinking?: 1 - 2     How often do you have 5 or more drinks on one occasion?: Never Housing: Low Risk  (01/18/2024)    Housing     Within the past 12 months, have you ever stayed: outside, in a car, in a tent, in an overnight shelter, or temporarily in someone else's home (i.e. couch-surfing)?: No     Are you worried about losing your housing?: No   Physical Activity: Insufficiently Active (07/29/2020)    Received from [facility] visits prior to 08/26/2022.    Exercise Vital Sign     On average, how many days per week do you engage in moderate to strenuous exercise (like a brisk walk)?: 2 days     On average, how many minutes do you engage in exercise at this level?: 20 min   Utilities: Low Risk  (01/18/2024)    Utilities     Within the past 12 months, have you been unable to get utilities (heat, electricity) when it was really needed?: No   Stress: No Stress Concern Present (07/29/2020)    Received from [facility] visits prior to 08/26/2022.    [employer] of Occupational Health - Occupational Stress Questionnaire  Feeling of Stress : Not at all   Interpersonal Safety: Not At Risk (03/31/2024)    Interpersonal Safety     Unsafe Where You Currently Live: No     Physically Hurt by Anyone: No     Abused by Anyone: No   Substance Use: Low Risk  (03/31/2024)    Substance Use     In the past year, how often have you used prescription drugs for non-medical reasons?: Never     In the past year, how often have you used illegal drugs?: Never     In the past year, have you used any substance for non-medical reasons?: No   Intimate Partner Violence: Not At Risk (01/26/2024)    Humiliation, Afraid, Rape, and Kick questionnaire     Fear of Current or Ex-Partner: No     Emotionally Abused: No     Physically Abused: No     Sexually Abused: No   Social Connections: Socially Isolated (07/29/2020)    Received from [facility] visits prior to 08/26/2022.    Social Connection and Isolation Panel     In a typical week, how many times do you talk on the phone with family, friends, or neighbors?: More than three times a week     How often do you get together with friends or relatives?: Twice a week     How often do you attend church or religious services?: Never     Do you belong to any clubs or organizations such as church groups, unions, fraternal or athletic groups, or school groups?: No     How often do you attend meetings of the clubs or organizations you belong to?: Never     Are you married, widowed, divorced, separated, never married, or living with a partner?: Divorced   Physicist, medical Strain: Low Risk  (01/18/2024)    Overall Financial Resource Strain (CARDIA)     Difficulty of Paying Living Expenses: Not hard at all   Health Literacy: Low Risk  (11/09/2023)    Health Literacy     : Never   Internet Connectivity: Not on file       Predictive Model Details   No score data available for [facility] Risk of Unplanned Readmission

## 2024-03-31 NOTE — Unmapped (Signed)
 Shift Summary  Blood glucose was 476 mg/dL notified Dr. Nestor Rhein with new orders received and insulin  lispro was administered.   Pain escalated to severe levels in the afternoon and evening, requiring repeated administration of pain medications with some relief noted.   Drain output remained low and dressings stayed clean and intact, with no significant increase in drainage.   Fall prevention measures and scheduled toileting were consistently maintained, and patient remained in bed during hourly checks.   Patient experienced persistent pain and hyperglycemia, but safety interventions and wound care were     Absence of Bleeding: Drain output remained low and dressing on wound and closed/suction drain sites stayed clean, dry, and intact throughout the shift; suction drain continued on low continuous setting and drainage appeared bloody but did not increase significantly.     Optimal Pain Control and Function: Pain scores fluctuated and increased to severe levels later in the shift despite multiple administrations of fentaNYL  (PF), oxyCODONE , and HYDROmorphone  (PF) in the PACU Hbr and on the unit; pain was noted as gradually worsening by the end of the shift.     Absence of Fall and Fall-Related Injury: Fall reduction program and safety interventions were maintained, hourly visual checks confirmed patient remained in bed and awake for most of the shift, and scheduled toileting was consistently performed every two hours.       Problem: Surgery Nonspecified  Goal: Absence of Bleeding  Outcome: Shift Focus  Goal: Optimal Pain Control and Function  Outcome: Shift Focus     Problem: Fall Injury Risk  Goal: Absence of Fall and Fall-Related Injury  Outcome: Shift Focus

## 2024-03-31 NOTE — Unmapped (Signed)
 Tacrolimus  Therapeutic Monitoring Pharmacy Note    Richard [name] is a 58 y.o. male continuing tacrolimus .     Indication: Liver transplant     Date of Transplant: 02/2012      Prior Dosing Information: Home regimen tacrolimus  1 mg po bid     Source(s) of information used to determine prior to admission dosing: Clinic Note or Fill History    Goals:  Therapeutic Drug Levels  Tacrolimus  trough goal: 2-4 ng/ml    Additional Clinical Monitoring/Outcomes  Monitor renal function (SCr and urine output) and liver function (LFTs)  Monitor for signs/symptoms of adverse events (e.g., hyperglycemia, hyperkalemia, hypomagnesemia, hypertension, headache, tremor)    Previous Lab Values  Tacrolimus , Trough   Date/Time Value Ref Range Status   02/03/2024 04:50 AM 6.2 5.0 - 15.0 ng/mL Final   02/02/2024 08:34 AM 5.2 5.0 - 15.0 ng/mL Final   02/01/2024 04:19 AM 6.4 5.0 - 15.0 ng/mL Final   01/31/2024 05:26 AM 5.7 5.0 - 15.0 ng/mL Final   01/30/2024 03:43 AM 5.7 5.0 - 15.0 ng/mL Final   10/06/2014 09:00 AM 6.8 <=20.0 ng/mL Final   08/31/2014 08:34 AM 4.2  Final   08/03/2014 08:22 AM 4.7  Final   07/07/2014 08:37 AM 4.2  Final   06/09/2014 08:27 AM 3.5  Final       Result:  Tacrolimus  level from most recent outpatient draw was 6.2     Pharmacokinetic Considerations and Significant Drug Interactions:  Concurrent CYP3A4 substrates/inhibitors: None identified    Assessment/Plan:  Recommendedation(s)  Continue current regimen of tacrolimus  1 mg po bid    Follow-up  Next level to be determined by primary team.   A pharmacist will continue to monitor and recommend levels as appropriate    Please page service pharmacist with questions/clarifications.    Arland JINNY Motley, PharmD

## 2024-03-31 NOTE — Unmapped (Signed)
 Arteriovenous Fistula Transposition Procedure Note     Indications: ESRD on hemodialysis. Catheter dependent.     Pre-operative Diagnosis:  ESRD on hemodialysis. S/P brachiobasilic AVF creation     Post-operative Diagnosis: same.     Operation: Revision of arteriovenous fistula right arm with basilic vein transposition. Drainage of postoperative seroma.       Surgeon: Oliva Sprinkles, MD      Assistants: Nestor Rhein, Aidin, MD, R2     Anesthesia: general endotracheal     ASA Class: 4     Procedure Details   The patient was seen in the Holding Room. The risks, benefits, complications, treatment options, and expected outcomes were discussed with the patient. The patient concurred with the proposed plan, giving informed consent.  The site of surgery properly noted/marked. The procedure verified as Revision of arteriovenous fistula with transposition, right arm. A Time Out was held and the above information confirmed.     The patient was brought to the Operating Room. The right arm prepped and draped in a sterile fashion.  An initial skin incision was made longitudinally along the course of the basilic vein outflow on the medial aspect of the right upper arm. There was a small swelling beneath the previous transverse antecubital incision consistent with the known seroma. At first, the outflow vein was identified and unroofed for the length of the incision. The vein was then mobilized by dividing tributaries between silk ties and clips. The associated neural structures were careful protected from harm. As the vein was mobilized down near the antecubital level, the seroma cavity was entered and drained. I extended the incision to include the previous transverse portion. The region of the VasQ device was seen and appeared to be well-incorporated.   The thrill in the outflow vein was readily appreciated. The vein was dilated from 4 mm at the antecubital level to 8 mm at the axilla. The vein was clamped about 1 cm past the end of the VasQ device and divided. It was then brought through a subdermal curvilinear tunnel created from the antecubital level to the axilla. Care was taken to avoid rotation and the vein was repeatedly flushed to ensure this. Continuity was restored with an end-to-end anastomosis with a 2 suture technique of running 6-0 Prolene and beveling of both ends. When flow was restoired there was a brisk palpable thrill. A single bleeder was controlled with a simple suture of 7-0 Prolene. Hemostasis was made complete. I decided to leave a 10 round fluted Blake drain in the antecubital space and it was brought out on the anterior forearm. The wound was closed in layers with interrupted 3-0 Vicryl for deep dermal and running 4-0 Monocryl subcuticular for the skin. Dressing was applied.      Findings:   Thrill prtesent at the end of the procedure     Estimate Blood Loss:  Minimal           Drains: none           Total IV Fluids: 500 ml           Specimens: None           Implants: none           Complications:  None; patient tolerated the procedure well.           Disposition: PACU - hemodynamically stable.           Condition: stable     Attending Attestation: I was present and  scrubbed for the entire procedure except skin closure.

## 2024-03-31 NOTE — Unmapped (Unsigned)
 Patient Name: Richard [name]  Patient Age: 58 y.o.  Encounter Date: 03/31/24   Attending Interventional Radiologist: Dr. Gerard Gaskins  Resident Interventional Radiologist: Obadiah Norma, DO    Referring Physician: Gaskins Gerard Dines, MD  [address]  [facility]   [id number]  [place],  [place] [address]  Primary Care Provider: Jama Mayo, MD      SUBJECTIVE      Reason for Visit: Splenic artery embolization       History of Present Illness: Richard [name] is a 58 y.o. male who is seen in consultation at the request of Meghan Dines Gaskins for evaluation of splenic artery embolization.    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 CTAP showed small liver, large caliber portosystemic shunt b/w dilated IVC and venous collaterals. Per hepatology, patient was referred to IR for splenic artery pseudoaneurysm embolization for shunt seen and possible splenic artery embo to reduce portal HTN at same time to aid with hepatic encephalopathy symptoms. ***    {.QUESTIONNAIRE 2:103565}    Number and Complexity of Problems Addressed: {Number and Complexity of Problems Addressed:109604:s}      Past Medical History:  Past Medical History[1]    Past Surgical History:  Past Surgical History[2]    Family History:  Family History[3]     Allergies:  Allergies[4]     Anticoagulant/Antiplatelet Medications: {IR Anticoagulation:80477}    OBJECTIVE     Physical Exam:  There were no vitals filed for this visit.  There is no height or weight on file to calculate BMI.    {IR PHYSICAL ZKJF:19518}    Point-of-care US  performed: {VIR POCUS:80507}    Pertinent Laboratory Values:  {IR OJAD:19504}    03/31/2024, labs were reviewed by me personally. Interpretation: ***, and based on labs recommend {perdivisional guidelines:104037}.    Pertinent Imaging Studies: 03/31/2024, imaging was visualized and reviewed by me personally. Interpretation: ***, and based on imaging recommend ***.    Amount and/or Complexity of Data: {Amount and/or Complexity of Data to Be Reviewed and Analyzed:109609}    ASA Score: {ASA GRADE:110003}    Performance Status: {ecogps:28295}    ASSESSMENT/PLAN     Richard [name] is a 58 y.o. male with ***.    I discussed the various treatment options available. At this time, we feel that he would benefit most from {VIR2procedure:109602} The risks, benefits and alternatives were fully discussed including bleeding, infection, {RISK:104117}, and damage to adjacent structures/organs. The patient's questions were all answered to his satisfaction.     {.UJMZ:896465}    -- Procedure will be scheduled at the earliest mutually available date & time  -- Anticoagulant medication hold required: {VIR AC meds:105269}  -- Anticoagulation can be resumed: {Resume AC timeline:105268}  -- Antibiotics required: {.YES/NO:103532}  -- Discharge medications:{Yes2:103810} {.discharge medication:103531}  --Planned level of sedation: {IRlevelsedation2023:98360}  --Planned access: {right/left/bilateral:56652:x} {IRACCESSSITE:83441}, Positioning: {IRPOSITIONING:83503}  --Labs needed: {.yes2:103563}  --Pre-procedural medications required: {Yes2:103810} {.premedication:103536}  --Pre-US  needed: {Yes22:104148}  -- Spoke with {spoke with:105265}                                            -- Notes reviewed:{N/Y:103538}   --Follow up- {Follow up eojw:895853}  --{.uferisk:103533}      Informed consent obtained:   {.  wn:896462}    {VIR airway assessment:23727}    Patient seen and discussed with {IR Attendings 7977:19523}, who agrees with the above assessment and plan. Thank you for involving us  in the care of this patient.    Obadiah Norma, DO  03/31/24 8:33 PM          [1]   Past Medical History:  Diagnosis Date    CHF (congestive heart failure) (CMS-HCC)     COVID-19 07/18/2019    Diabetes mellitus (CMS-HCC)     Family history of malignant neoplasm of prostate 06/23/2015    Heart disease     Heart murmur     Hepatic cirrhosis    (CMS-HCC) 06/23/2015    Overview:  Secondary to NASH; followed by Dr. Lindaann, GI.  Last Assessment & Plan:  Relevant Hx: Course: Daily Update: Today's Plan:     Hypertension     Hypogonadism in male 02/12/2014    Kidney stone     Liver cirrhosis secondary to NASH (nonalcoholic steatohepatitis) (CMS-HCC)     s/p liver transplant 2013    Obesity    [2]   Past Surgical History:  Procedure Laterality Date    CHOLECYSTECTOMY      liver transpant      LIVER TRANSPLANTATION  06/27/2011    PR ANASTOMOSIS,AV,ANY SITE Right 02/20/2024    Procedure: ARTERIOVENOUS ANASTOMOSIS, OPEN; DIRECT, ANY SITE, UPPER EXTREMITY;  Surgeon: Marchelle Kitchens, MD;  Location[facility] OR [facility];  Service: General Surgery    PR AV FIST REVISE GRFT,W THROMBECTOMY Right 01/15/2023    Procedure: REVISION, ARTERIOVENOUS FISTULA W/ THROMBECTOMY, AUTOGENOUS OR NONAUTOGENOUS DIALYSIS GRAFT (SEP. PROC), LOWER EXTREMITY;  Surgeon: Marchelle Kitchens, MD;  Location: [facility] OR [facility];  Service: General Surgery    PR COLONOSCOPY FLX DX W/COLLJ SPEC WHEN PFRMD N/A 01/24/2024    Procedure: COLONOSCOPY, FLEXIBLE, PROXIMAL TO SPLENIC FLEXURE; DIAGNOSTIC, W/WO COLLECTION SPECIMEN BY BRUSH OR WASH;  Surgeon: Erick Prentice HERO, MD;  Location: [facility];  Service: Gastroenterology    PR COLONOSCOPY W/BIOPSY SINGLE/MULTIPLE N/A 08/13/2018    Procedure: COLONOSCOPY, FLEXIBLE, PROXIMAL TO SPLENIC FLEXURE; WITH BIOPSY, SINGLE OR MULTIPLE;  Surgeon: Thedora Alm Plain, MD;  Location: [facility];  Service: Gastroenterology    PR COLSC FLX W/RMVL OF TUMOR POLYP LESION SNARE TQ N/A 08/13/2018    Procedure: COLONOSCOPY FLEX; W/REMOV TUMOR/LES BY SNARE;  Surgeon: Thedora Alm Plain, MD;  Location: GI [facility];  Service: Gastroenterology    PR CREAT AV FISTULA,NON-AUTOGENOUS GRAFT Right 11/29/2022    Procedure: CREATE AV FISTULA (SEPARATE PROC); NONAUTOGENOUS GRAFT (EG, BIOLOGICAL COLLAGEN, THERMOPLASTIC GRAFT), UPPER EXTREMITY;  Surgeon: Marchelle Kitchens, MD;  Location: [facility] OR [facility];  Service: General Surgery    PR UPPER GI ENDOSCOPY,BIOPSY N/A 07/13/2015    Procedure: UGI ENDOSCOPY; WITH BIOPSY, SINGLE OR MULTIPLE;  Surgeon: Elspeth Jerilynn Reek, MD;  Location: GI [facility];  Service: Gastroenterology    PR UPPER GI ENDOSCOPY,BIOPSY N/A 02/13/2023    Procedure: UGI ENDOSCOPY; WITH BIOPSY, SINGLE OR MULTIPLE;  Surgeon: Minnie Krystal Claude, MD;  Location: GI [facility];  Service: Gastroenterology    PR UPPER GI ENDOSCOPY,DIAGNOSIS N/A 01/24/2024    Procedure: UGI ENDO, INCLUDE ESOPHAGUS, STOMACH, & DUODENUM &/OR JEJUNUM; DX W/WO COLLECTION SPECIMN, BY BRUSH OR WASH;  Surgeon: Erick Prentice HERO, MD;  Location: GI [facility];  Service: Gastroenterology   [3]   Family History  Problem Relation Age of Onset    Diabetes  Father     Diabetes Paternal Grandfather     Liver disease Maternal Uncle     Cancer Maternal Grandmother     Melanoma Neg Hx     Basal cell carcinoma Neg Hx     Squamous cell carcinoma Neg Hx     Kidney disease Neg Hx    [4] No Known Allergies

## 2024-04-01 LAB — COMPREHENSIVE METABOLIC PANEL
ALBUMIN: 2.9 g/dL — ABNORMAL LOW (ref 3.4–5.0)
ALKALINE PHOSPHATASE: 132 U/L — ABNORMAL HIGH (ref 46–116)
ALT (SGPT): <7 U/L — ABNORMAL LOW (ref 10–49)
ANION GAP: 18 mmol/L — ABNORMAL HIGH (ref 5–14)
AST (SGOT): 15 U/L (ref <=34)
BILIRUBIN TOTAL: 0.4 mg/dL (ref 0.3–1.2)
BLOOD UREA NITROGEN: 56 mg/dL — ABNORMAL HIGH (ref 9–23)
BUN / CREAT RATIO: 6
CALCIUM: 7.3 mg/dL — ABNORMAL LOW (ref 8.7–10.4)
CHLORIDE: 95 mmol/L — ABNORMAL LOW (ref 98–107)
CO2: 21.1 mmol/L (ref 20.0–31.0)
CREATININE: 10.15 mg/dL — ABNORMAL HIGH (ref 0.73–1.18)
EGFR CKD-EPI (2021) MALE: 5 mL/min/1.73m2 — ABNORMAL LOW (ref >=60)
GLUCOSE RANDOM: 376 mg/dL — ABNORMAL HIGH (ref 70–179)
POTASSIUM: 6 mmol/L — ABNORMAL HIGH (ref 3.5–5.1)
PROTEIN TOTAL: 6.5 g/dL (ref 5.7–8.2)
SODIUM: 134 mmol/L — ABNORMAL LOW (ref 135–145)

## 2024-04-01 LAB — CBC
HEMATOCRIT: 34.1 % — ABNORMAL LOW (ref 39.0–48.0)
HEMOGLOBIN: 11.5 g/dL — ABNORMAL LOW (ref 12.9–16.5)
MEAN CORPUSCULAR HEMOGLOBIN CONC: 33.6 g/dL (ref 32.0–36.0)
MEAN CORPUSCULAR HEMOGLOBIN: 28.6 pg (ref 25.9–32.4)
MEAN CORPUSCULAR VOLUME: 85.3 fL (ref 77.6–95.7)
MEAN PLATELET VOLUME: 7.9 fL (ref 6.8–10.7)
PLATELET COUNT: 194 10*9/L (ref 150–450)
RED BLOOD CELL COUNT: 4 10*12/L — ABNORMAL LOW (ref 4.26–5.60)
RED CELL DISTRIBUTION WIDTH: 14.1 % (ref 12.2–15.2)
WBC ADJUSTED: 8.7 10*9/L (ref 3.6–11.2)

## 2024-04-01 LAB — TACROLIMUS LEVEL, TROUGH: TACROLIMUS, TROUGH: 4.5 ng/mL — ABNORMAL LOW (ref 5.0–15.0)

## 2024-04-01 LAB — HEPATITIS B SURFACE ANTIGEN: HEPATITIS B SURFACE ANTIGEN: NONREACTIVE

## 2024-04-01 MED ADMIN — gabapentin (NEURONTIN) capsule 300 mg: 300 mg | ORAL

## 2024-04-01 MED ADMIN — calcitriol (ROCALTROL) capsule 0.25 mcg: .25 ug | ORAL | @ 18:00:00

## 2024-04-01 MED ADMIN — insulin lispro (HumaLOG) injection CORRECTIONAL 0-20 Units: 0-20 [IU] | SUBCUTANEOUS | @ 22:00:00

## 2024-04-01 MED ADMIN — oxyCODONE (ROXICODONE) immediate release tablet 10 mg: 10 mg | ORAL | @ 19:00:00 | Stop: 2024-04-01

## 2024-04-01 MED ADMIN — oxyCODONE (ROXICODONE) immediate release tablet 10 mg: 10 mg | ORAL | @ 12:00:00 | Stop: 2024-04-01

## 2024-04-01 MED ADMIN — tacrolimus (PROGRAF) capsule 1 mg: 1 mg | ORAL | @ 12:00:00

## 2024-04-01 MED ADMIN — insulin lispro (HumaLOG) injection 35 Units: 35 [IU] | SUBCUTANEOUS | @ 11:00:00 | Stop: 2024-04-01

## 2024-04-01 MED ADMIN — insulin glargine (LANTUS) injection BASAL 60 Units: 60 [IU] | SUBCUTANEOUS | @ 01:00:00

## 2024-04-01 MED ADMIN — insulin lispro (HumaLOG) injection CORRECTIONAL 0-20 Units: 0-20 [IU] | SUBCUTANEOUS | @ 01:00:00

## 2024-04-01 MED ADMIN — gentamicin-sodium citrate lock solution in NS: 2.5 mL | @ 18:00:00

## 2024-04-01 MED ADMIN — HYDROmorphone (PF) (DILAUDID) injection 0.5 mg: .5 mg | INTRAVENOUS | @ 06:00:00 | Stop: 2024-04-01

## 2024-04-01 MED ADMIN — methocarbamol (ROBAXIN) tablet 500 mg: 500 mg | ORAL | @ 22:00:00

## 2024-04-01 MED ADMIN — alteplase (ACTIVase) injection small catheter clearance 2 mg: 2 mg | @ 14:00:00 | Stop: 2024-04-01

## 2024-04-01 MED ADMIN — HYDROmorphone (PF) (DILAUDID) injection 0.5 mg: .5 mg | INTRAVENOUS | @ 11:00:00 | Stop: 2024-04-01

## 2024-04-01 MED ADMIN — HYDROmorphone (PF) (DILAUDID) injection 0.5 mg: .5 mg | INTRAVENOUS | @ 02:00:00 | Stop: 2024-04-01

## 2024-04-01 MED ADMIN — mycophenolate (CELLCEPT) capsule 250 mg: 250 mg | ORAL | @ 12:00:00

## 2024-04-01 MED ADMIN — heparin (porcine) 1000 unit/mL injection 1,000 Units: 1000 [IU] | @ 13:00:00

## 2024-04-01 MED ADMIN — insulin lispro (HumaLOG) injection 40 Units: 40 [IU] | SUBCUTANEOUS | @ 19:00:00

## 2024-04-01 MED ADMIN — oxyCODONE (ROXICODONE) immediate release tablet 10 mg: 10 mg | ORAL | @ 04:00:00 | Stop: 2024-04-01

## 2024-04-01 MED ADMIN — heparin (porcine) 1000 unit/mL injection 1,000 Units: 1000 [IU] | @ 15:00:00

## 2024-04-01 MED ADMIN — insulin lispro (HumaLOG) injection CORRECTIONAL 0-20 Units: 0-20 [IU] | SUBCUTANEOUS | @ 12:00:00 | Stop: 2024-04-01

## 2024-04-01 NOTE — Unmapped (Signed)
 [facility] Nephrology Hemodialysis Procedure Note     04/01/2024    Richard [name] was seen and examined on hemodialysis    CHIEF COMPLAINT: End Stage Renal Disease    INTERVAL HISTORY: Underwent revision of AVF with drain placement to seroma yesterday. Doing well today. Catheter not running well - only 250 BFR so dwelled TPA and doing better but art pressures still quite high. Asked team to put in consult for IR to replace.    DIALYSIS TREATMENT DATA:  Estimated Dry Weight (kg): 118.5 kg (261 lb 3.9 oz) Patient Goal Weight (kg): 4 kg (8 lb 13.1 oz)   Pre-Treatment Weight (kg): 126.6 kg (279 lb 1.6 oz)    Dialysis Bath  Bath: 3 K+ / 2.5 Ca+  Dialysate Na (mEq/L): 137 mEq/L  Dialysate HCO3 (mEq/L): 35 mEq/L Dialyzer: F-180 (98 mLs)   Blood Flow Rate (mL/min): 375 mL/min Dialysis Flow (mL/min): 800 mL/min   Machine Temperature (C): 36.5 ??C (97.7 ??F)      PHYSICAL EXAM:  Vitals:  Temp:  [35.6 ??C (96 ??F)-36.7 ??C (98.1 ??F)] 36.7 ??C (98.1 ??F)  Pulse:  [54-94] 56  SpO2 Pulse:  [72-75] 74  BP: (94-165)/(74-110) 165/78  MAP (mmHg):  [99-122] 120    General: in no acute distress, currently dialyzing in a Hemodialysis Recliner  Pulmonary: clear to auscultation  Cardiovascular: regular rate and rhythm  Extremities: trace  edema  Access: Left IJ tunneled catheter     LAB DATA:  Lab Results   Component Value Date    NA 134 (L) 04/01/2024    K 6.0 (H) 04/01/2024    CL 95 (L) 04/01/2024    CO2 21.1 04/01/2024    BUN 56 (H) 04/01/2024    CREATININE 10.15 (H) 04/01/2024    GLUCOSE 196 03/03/2012    CALCIUM  7.3 (L) 04/01/2024    MG 2.2 01/26/2024    PHOS 3.7 02/03/2024    ALBUMIN 2.9 (L) 04/01/2024      Lab Results   Component Value Date    HCT 34.1 (L) 04/01/2024    WBC 8.7 04/01/2024        ASSESSMENT/PLAN:  End Stage Renal Disease on Intermittent Hemodialysis:  UF goal: 4L as tolerated  Adjust medications for a GFR <10  Avoid nephrotoxic agents  Last HD Treatment:Started (04/01/24)     Bone Mineral Metabolism:  Lab Results   Component Value Date    CALCIUM  7.3 (L) 04/01/2024    CALCIUM  8.3 (L) 02/26/2024    Lab Results   Component Value Date    ALBUMIN 2.9 (L) 04/01/2024    ALBUMIN 3.7 02/26/2024      Lab Results   Component Value Date    PHOS 3.7 02/03/2024    PHOS 5.1 02/02/2024    Lab Results   Component Value Date    PTH 267.5 (H) 10/14/2021      Labs appropriate, no changes.    Anemia:   Lab Results   Component Value Date    HGB 11.5 (L) 04/01/2024    HGB 12.5 (L) 02/26/2024    HGB 11.3 (L) 01/31/2024    Iron Saturation (%)   Date Value Ref Range Status   11/09/2022 36 % Final   05/15/2012 39 20 - 50 % Final      Lab Results   Component Value Date    FERRITIN 169.8 11/09/2022       Anemia labs appropriate, no changes.    Vascular Access:  CVC not running well  despite TPA dwell - consult IR to exchange line.  Blood Flow Rate (mL/min): 375 mL/min    IV Antibiotics to be administered at discharge:  No    This procedure was fully reviewed with the patient and/or their decision-maker. The risks, benefits, and alternatives were discussed prior to the procedure. All questions were answered and written informed consent was obtained.    Richard Tweten MARLA Ku, MD  [facility] Division of Nephrology & Hypertension

## 2024-04-01 NOTE — Unmapped (Signed)
 Nephrology ESKD Consult Note    Requesting provider: Oliva Sprinkles, MD  Service requesting consult: General Surgery  Reason for consult: Evaluation for dialysis needs    Outpatient dialysis unit:   [facility]  [address]  [place] [address]     Assessment/Recommendations: Richard [name] is a 58 y.o. male with a past medical history notable for ESKD on in-center hemodialysis being evaluated at [facility] for drainage from AVF now s/p revision with drain to seroma.    # ESKD:   Outpatient HD Dialysis Rx and medications    Dialysis schedule: TTS   Time: 4  hrs Temp: 36.5C   Dialyzer: Optiflux 180 NRE Heparin : Receiving heparin  1000 unit bolus / 1000 unit mid-tx bolus   Dialysate Bath: 3 K, 2.5Ca, Na 137, Bicarbonate 35   EDW: 118.5 kg      - OP dialysis prescription reviewed and ordered  - Indication for acute dialysis?: No  - We will perform HD starting today and will otherwise attempt to continue pt's outpatient schedule if possible.   - Access: Right IJ tunneled catheter ; has been having difficulty running and has had to have exchanged twice over the last year. Will see how it runs today.   - Hepatitis status: Hepatitis B surface antigen negative on 02/06/24.    # Volume / Hypertension:  - Volume: Will attempt to achieve dry weight if tolerated  - Will adjust daily as needed    # Acid-Base / Electrolytes:  - Will evaluate acid-base status and electrolyte balance daily and adjust dialysate as needed.  Lab Results   Component Value Date    K 6.0 (H) 04/01/2024    CO2 21.1 04/01/2024       # Anemia of Chronic Kidney Disease:  - ESA: Patient not on ESA  Date of last ESA dose N/A  - IV Iron/ PO oral iron: none  - N/A  Lab Results   Component Value Date    HGB 11.5 (L) 04/01/2024       # Secondary Hyperparathyroidism/Hyperphosphatemia:  - Phosphate binders : sevelamer  1600mg  QAC  - Activated Vitamin D Analogs: Patient not on any activated Vitamin D analogs , Calcitriol  0.5mg  TIW  - Calcimimetics: 180mg  TIW  - Outpatient meds reviewed and ordered   Lab Results   Component Value Date    PHOS 3.7 02/03/2024    CALCIUM  7.3 (L) 04/01/2024     # HTN: BP fluctuates extensively, but generally above goal. This could be related to pain so would not change regimen at this time. Not on home antihypertensives.    # S/P OLT: Continue home IS regimen: MMF 250mg  BID, tacrolimus  1mg  BID.    # AVF Seroma   # T2D  - Evaluation and management per primary team  - No changes to management from a nephrology standpoint at this time    Richard MARLA Ku, MD  04/01/2024 11:17 AM   Medical decision-making for 04/01/24  Findings / Data     Patient has: []  acute illness w/systemic sxs  [mod]  []  two or more stable chronic illnesses [mod]  []  one chronic illness with acute exacerbation [mod]  []  acute complicated illness  [mod]  []  Undiagnosed new problem with uncertain prognosis  [mod] [x]  illness posing risk to life or bodily function (ex. AKI)  [high]  []  chronic illness with severe exacerbation/progression  [high]  []  chronic illness with severe side effects of treatment  [high] ESKD on RRT Probs  At least 2:  Probs, Data, Risk   I reviewed: []  primary team note  []  consultant note(s)  [x]  external records [x]  chemistry results  [x]  CBC results  []  blood gas results  []  Other []  procedure/op note(s)   []  radiology report(s)  []  micro result(s)  []  w/ independent historian(s) Outside dialysis prescription reviewed and ordered, K and Hb addressed above >=3 Data Review (2 of 3)    I independently interpreted: []  Urine Sediment  []  Renal US  []  CXR Images  []  CT Images  []  Other []  EKG Tracing   Any     I discussed: []  Pathology results w/ QHPs(s) from other specialties  []  Procedural findings w/ QHPs(s) from other specialties []  Imaging w/ QHP(s) from other specialties  [x]  Treatment plan w/ QHP(s) from other specialties Plan discussed with primary team Any     Mgm't requires: []  Prescription drug(s)  [mod]  []  Kidney biopsy [mod]  []  Central line placement  [mod] []  High risk medication use and/or intensive toxicity monitoring [high]  [x]  Renal replacement therapy [high]  []  High risk kidney biopsy  [high]  []  Escalation of care  [high]  []  High risk central line placement  [high] RRT: High risk of complications from RRT requiring intensive monitoring Risk        _____________________________________________________________________________________    History of Present Illness: Richard [name] is a 58 y.o. male with ESKD on dialysis as well as T2D, [name] cirrhosis s/p OLT 2013, being evaluated at [facility] for drainage from right brachiobasilic AVF placed 02/20/24 who is seen in consultation at the request of Oliva Sprinkles, MD and General Surgery. Nephrology has been consulted for evaluation of dialysis needs in the setting of end-stage kidney disease.    Underwent revision of AVF with drain placed in postop seroma. He is feeling well today. Denies CP, SOB, cough, N/V/D or edema. Dialyzes TTS at [facility] via [facility]. Last treatment Sat 03/29/24.     INPATIENT MEDICATIONS:Current Medications[1]    OUTPATIENT MEDICATIONS:  Prior to Admission medications   Medication Dose, Route, Frequency   apixaban  (ELIQUIS ) 5 mg Tab 5 mg, Oral, 2 times a day (standard)   blood-glucose meter,continuous (DEXCOM G6 RECEIVER) Misc 1 each, Miscellaneous, Daily, Dispense 1 receiver annually.  Sent to [facility]   blood-glucose sensor (DEXCOM G7 SENSOR) Devi Use to continuously monitor blood glucose.  Change sensor every 10 days.   blood-glucose transmitter (DEXCOM G6 TRANSMITTER) Devi 1 each, Miscellaneous, Every 3 months, [facility] pharmacy   blood-glucose transmitter (DEXCOM G6 TRANSMITTER) Devi Use as directed to monitor blood glucose   CHOLECALCIFEROL, VITAMIN D3, (VITAMIN D3 ORAL) 2,000 Units, Daily (standard)   gabapentin  (NEURONTIN ) 300 MG capsule 300 mg, Oral, Nightly   hydrOXYzine  (ATARAX ) 25 MG tablet 25 mg, Oral, Every 6 hours PRN   insulin  aspart (NOVOLOG  FLEXPEN U-100 INSULIN ) 100 unit/mL (3 mL) injection pen 35 Units, Subcutaneous, 3 times a day (AC)   insulin  degludec (TRESIBA  FLEXTOUCH U-200) 200 unit/mL (3 mL) InPn 60 Units, Subcutaneous, Daily (standard), Max dose of 100 units per day.   multivitamin (TAB-A-VITE/THERAGRAN) per tablet 1 tablet, Daily (standard)   mycophenolate  (CELLCEPT ) 250 mg capsule 250 mg, Oral, 2 times a day (standard)   oxyCODONE  (ROXICODONE ) 5 MG immediate release tablet 5 mg, Oral, Every 6 hours PRN   pen needle, diabetic (TRUEPLUS PEN NEEDLE) 32 gauge x 5/32 (4 mm) Ndle Use with insulin  up to 4 times a day as needed.   pen needle, diabetic  32 gauge x 5/32 (4 mm) Ndle Use with insulin  up to 4 times/day as needed.   rifAXIMin  (XIFAXAN ) 550 mg Tab 550 mg, Oral, 2 times a day (standard)   sevelamer  (RENAGEL ) 800 MG tablet 1,600 mg, 3 times a day (with meals)   tacrolimus  (PROGRAF ) 0.5 MG capsule 1 mg, Oral, 2 times a day   tirzepatide  (MOUNJARO ) 15 mg/0.5 mL PnIj 15 mg, Subcutaneous, Every 7 days   ursodiol  (ACTIGALL ) 300 mg capsule 600 mg, Oral, 2 times a day (standard)        ALLERGIES  Patient has no known allergies.    MEDICAL HISTORY  Past Medical History[2]    SOCIAL HISTORY  Social History     Social History Narrative    From [place]    He was in the [employer].    Now lives in [place]    He works in Engineer, petroleum        Married    1 child (age 32.5)      reports that he has never smoked. He has been exposed to tobacco smoke. He has never used smokeless tobacco. He reports that he does not drink alcohol and does not use drugs.     FAMILY HISTORY  Family History[3]     Physical Exam:  Vitals:    04/01/24 1100   BP: 165/78   Pulse: 56   Resp: 17   Temp:    SpO2:      I/O this shift:  In: 240 [P.O.:240]  Out: -     Intake/Output Summary (Last 24 hours) at 04/01/2024 1117  Last data filed at 04/01/2024 0816  Gross per 24 hour   Intake 576 ml   Output 190 ml   Net 386 ml       General: well-appearing, no acute distress  Heart: RRR, no m/r/g  Lungs: CTAB, normal wob  Abd: soft, non-tender, non-distended  Ext: trace edema  Access: Left IJ tunneled catheter  without drainage/erythema              [1]   Current Facility-Administered Medications:     acetaminophen  (TYLENOL ) tablet 500 mg, Oral, Q6H    albumin human 25 % 25 g, Intravenous, Each time in dialysis PRN    calcitriol  (ROCALTROL ) capsule 0.25 mcg, Oral, Each time in dialysis    dextrose  50 % in water (D50W) 50 % solution 12.5 g, Intravenous, Q15 Min PRN    gabapentin  (NEURONTIN ) capsule 300 mg, Oral, Nightly    gentamicin -sodium citrate  lock solution in NS, Intra-cannular, Each time in dialysis PRN    gentamicin -sodium citrate  lock solution in NS, Intra-cannular, Each time in dialysis PRN    glucagon  injection 1 mg, Intramuscular, Once PRN    glucose chewable tablet 16 g, Oral, Q10 Min PRN    heparin  (porcine) 1000 unit/mL injection 1,000 Units, Dialysis Circuit, Each time in dialysis    heparin  (porcine) 1000 unit/mL injection 1,000 Units, Dialysis Circuit, Each time in dialysis    heparin  (porcine) 5,000 unit/mL injection 5,000 Units, Subcutaneous, Q8H SCH    hydrOXYzine  (ATARAX ) tablet 25 mg, Oral, Q6H PRN    insulin  glargine (LANTUS ) injection BASAL 60 Units, Subcutaneous, Nightly    insulin  lispro (HumaLOG ) injection 35 Units, Subcutaneous, TID AC    insulin  lispro (HumaLOG ) injection CORRECTIONAL 0-20 Units, Subcutaneous, 5XD insulin     insulin  NPH (HumuLIN ,NovoLIN ) injection 10 Units, Subcutaneous, Once    methocarbamol (ROBAXIN) tablet 500 mg, Oral, QID    mycophenolate  (CELLCEPT ) capsule  250 mg, Oral, BID    ondansetron  (ZOFRAN -ODT) disintegrating tablet 4 mg, Oral, Q4H PRN **OR** ondansetron  (ZOFRAN ) injection 4 mg, Intravenous, Q4H PRN    oxyCODONE  (ROXICODONE ) immediate release tablet 5 mg, Oral, Q4H PRN **OR** oxyCODONE  (ROXICODONE ) immediate release tablet 10 mg, Oral, Q4H PRN    sodium chloride  0.9% (NS) bolus 500 mL, Intravenous, Each time in dialysis PRN    tacrolimus  (PROGRAF ) capsule 1 mg, Oral, BID  [2]   Past Medical History:  Diagnosis Date    CHF (congestive heart failure) (CMS-HCC)     COVID-19 07/18/2019    Diabetes mellitus (CMS-HCC)     Family history of malignant neoplasm of prostate 06/23/2015    Heart disease     Heart murmur     Hepatic cirrhosis    (CMS-HCC) 06/23/2015    Overview:  Secondary to NASH; followed by Dr. Lindaann, GI.  Last Assessment & Plan:  Relevant Hx: Course: Daily Update: Today's Plan:     Hypertension     Hypogonadism in male 02/12/2014    Kidney stone     Liver cirrhosis secondary to NASH (nonalcoholic steatohepatitis) (CMS-HCC)     s/p liver transplant 2013    Obesity    [3]   Family History  Problem Relation Age of Onset    Diabetes Father     Diabetes Paternal Grandfather     Liver disease Maternal Uncle     Cancer Maternal Grandmother     Melanoma Neg Hx     Basal cell carcinoma Neg Hx     Squamous cell carcinoma Neg Hx     Kidney disease Neg Hx

## 2024-04-01 NOTE — Unmapped (Signed)
 Shift Summary  Correctional insulin  was administered in response to a high blood glucose reading, and provider was notified as instructed.  Hepatitis B screening returned nonreactive.  SOFA and sepsis risk scores remained stable throughout the shift.  Acetaminophen  was offered but refused.  Overall, therapy delivery was maintained and no new hospital-acquired illness or injury was documented during the shift.    Absence of Hospital-Acquired Illness or Injury: Sepsis risk scores and SOFA scores remained unchanged throughout the shift, and hepatitis B surface antigen was nonreactive.    Safe, Effective Therapy Delivery: Blood glucose was elevated and correctional insulin  was administered as ordered, with provider notification per instructions.

## 2024-04-01 NOTE — Unmapped (Signed)
 Shift Summary  Blood glucose levels were elevated in the morning and decreased after correctional insulin  administration notifed MD with new orders received.   Oxycodone  was administered twice for pain management, with pain scores decreasing after hemodialysis but increasing again later in the shift.   Hemodialysis was completed with some blood flow rate fluctuations and alarms, but treatment was tolerated and catheter care was provided in Dialysis Hbr.   Bloody drainage was observed from the closed suction drain, but no further intervention was documented.   Overall, safety interventions and fall precautions were maintained, and no falls or injuries occurred during the shift.   Patient had 4 liters of fluid removed during dialysis today.    Absence of Bleeding: No active bleeding was observed during the shift; bleeding precautions and anticoagulant therapy were maintained, and bloody drainage from the closed suction drain was noted at 3:00 PM but no further intervention was documented.     Absence of Fall and Fall-Related Injury: Fall reduction strategies were consistently maintained throughout the shift, including low bed position, nonskid footwear, and scheduled toileting, with no falls or injuries reported.     Effective Tissue Perfusion: Hemodialysis was completed with fluctuating blood flow rates, including a period of reduced flow and alarms, but overall treatment was tolerated well and rinseback volume was documented.     Absence of Infection Signs and Symptoms: Temperature remained within a narrow range and sepsis risk scores fluctuated but decreased by the end of the shift; no documentation of infection symptoms was noted.       Problem: Surgery Nonspecified  Goal: Absence of Bleeding  Outcome: Shift Focus     Problem: Fall Injury Risk  Goal: Absence of Fall and Fall-Related Injury  Outcome: Shift Focus  Intervention: Promote Injury-Free Environment  Recent Flowsheet Documentation  Taken 04/01/2024 0816 by Sheppard Grooms I, RN  Safety Interventions:   fall reduction program maintained   low bed   nonskid shoes/slippers when out of bed  Taken 04/01/2024 0725 by Sheppard Grooms I, RN  Safety Interventions:   fall reduction program maintained   low bed   nonskid shoes/slippers when out of bed     Problem: Hemodialysis  Goal: Effective Tissue Perfusion  Outcome: Shift Focus  Goal: Absence of Infection Signs and Symptoms  Outcome: Shift Focus     Problem: Adult Inpatient Plan of Care  Goal: Absence of Hospital-Acquired Illness or Injury  Intervention: Identify and Manage Fall Risk  Recent Flowsheet Documentation  Taken 04/01/2024 0816 by Sheppard Grooms I, RN  Safety Interventions:   fall reduction program maintained   low bed   nonskid shoes/slippers when out of bed  Taken 04/01/2024 0725 by Sheppard Grooms I, RN  Safety Interventions:   fall reduction program maintained   low bed   nonskid shoes/slippers when out of bed  Intervention: Prevent Skin Injury  Recent Flowsheet Documentation  Taken 04/01/2024 1600 by Ellese Julius, Grooms I, RN  Positioning for Skin: (Sitting on edge of bed) Other (Comment)  Taken 04/01/2024 0816 by Sheppard Grooms I, RN  Positioning for Skin: (Sitting on edge of bed) Other (Comment)  Taken 04/01/2024 0725 by Sheppard Grooms I, RN  Positioning for Skin: (Sitting on edge of bed) Other (Comment)  Intervention: Prevent and Manage VTE (Venous Thromboembolism) Risk  Recent Flowsheet Documentation  Taken 04/01/2024 1600 by Dodi Leu I, RN  Anti-Embolism Device Type: SCD, Knee  Anti-Embolism Device Status: Refused  Anti-Embolism Device Location: BLE  Taken 04/01/2024 0816 by Sheppard Grooms I,  RN  VTE Prevention/Management:   ambulation promoted   anticoagulant therapy   bleeding precautions maintained   bleeding risk factors identified  Anti-Embolism Device Type: SCD, Knee  Anti-Embolism Device Status: Refused  Anti-Embolism Device Location: BLE  Taken 04/01/2024 0725 by Sheppard Grooms I, RN  Anti-Embolism Device Type: SCD, Knee  Anti-Embolism Device Status: Refused  Anti-Embolism Device Location: BLE     Problem: Surgery Nonspecified  Goal: Anesthesia/Sedation Recovery  Intervention: Optimize Anesthesia Recovery  Recent Flowsheet Documentation  Taken 04/01/2024 0816 by Sheppard Grooms I, RN  Safety Interventions:   fall reduction program maintained   low bed   nonskid shoes/slippers when out of bed  Taken 04/01/2024 0725 by Sheppard Grooms I, RN  Safety Interventions:   fall reduction program maintained   low bed   nonskid shoes/slippers when out of bed

## 2024-04-01 NOTE — Unmapped (Signed)
 Endocrine Team Diabetes New Consult Note     Consult information:  Requesting Attending Physician : Richard [name]  Service Requesting Consult : General Surgery  Primary Care Provider: Jama Mayo, [name]  Impression:  Richard [name] is a 58 y.o. male admitted for pain and drainage from new brachiobasilic arteriovenous fistula on 02/20/24 as part of a two stage transposition. Now s/p revision and drainage 10/6. We have been consulted at the request of Richard [name] to evaluate Richard [name] for hyperglycemia.     Medical Decision Making:  Diagnoses:  1.Type 2 Diabetes. Uncontrolled With severe hyperglycemia last 24 hours.  2. Nutrition: Complicating glycemic control. Increasing risk for both hypoglycemia and hyperglycemia.  3. End Stage Renal Disease. Complicating glycemic control and increasing risk for hypoglycemia.  4. Liver Transplant. Complicating glycemic control and increasing risk for hyperglycemia.  5. Heart Failure. Complicating glycemic control and increasing risk for hyperglycemia.  6. Steroids. Complicating glycemic control and increasing risk for hyperglycemia.      Studies reviewed 04/01/24:  Labs: CBC, CMP, POCT-BG, and HbA1C  Interpretation: WBCs normal. Anemia noted. Hyponatremia. Hyperkalemia. Elevated Cr with reduced GFR in setting of known ESRD. Hyperglycemia, some severe. A1C 11.8% indicates poor control outpatient. Elevated Alk phos.  Notes reviewed: Primary team and nursing notes      Overall impression based on above reviews and history:  Patient with poorly controlled T2DM vs post transplant diabetes c/b ESRD, now admitted for pain and drainage from new brachiobasilic arteriovenous fistula on 02/20/24. Noted to be severely hyperglycemic in the setting of dexamethasone  4 mg given in OR on 10/6. He was started on home basal/bolus last night by primary team. AM BG remains elevated at 354 mg/dL. He is receiving iHD today. Recommend giving a one time dose of NPH this AM to help cover steroid induced hyperglycemia. Will also increase meal coverage by 10 units per patient preference and intensify correction scale. Expect steroid effects to wear off tomorrow, 10/8. Insulin  recs reviewed with team and RN.     Recommendations:  - Give one time dose of NPH 10 units this AM  - Continue Lantus  60 units at bedtime  - Increase Lispro to 40 units qAC   - patient to request reduced dose if preferred based on meal size and pre-meal BG  - Increase Lispro correction to 1:30>140 5x/day  - Hypoglycemia protocol.  - POCT-BG 5 times a day.  - Ensure patient is on glucose precautions if patient taking nutrition by mouth.     Discharge planning:  In process. Will complete closer to discharge.Will discharge on home medications. Precise doses to be determined. Will also set patient up with outpatient endo follow-up in our [place] clinic.     Thank you for this consult. Discussed plan with primary team. We will continue to follow and make recommendations and place orders as appropriate.    Please page with questions or concerns: Richard [name], Richard [name]: [phone]  Endocrinology Diabetes Care Team on call from 6AM - 3PM on weekdays then endocrine fellow on call: [phone] from 3PM - 6AM on weekdays and on weekends and holidays.   If APP cannot be reached, please page the endocrine fellow on call.      Subjective:  Initial HPI:  Richard [name] is a 58 y.o. 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 Now s/p revision and drainage.     Patient seen in dialysis this afternoon. He reports feeling OK at time of rounds. Pain controlled. States he was able to eat breakfast this AM. Denies N/V. Plan for possible VIR procedure tomorrow, 10/8.     Diabetes History:  Patient has a history of Type 2 diabetes diagnosed around 8 years ago after liver transplant.  Diabetes is managed by: PCP. Patient requesting endocrinology follow-up outpatient.   Current home diabetes regimen: Trizepatide 15 mg weekly (last taken 3+ weeks ago), Tresiba  U-200 55-60 units every day, novolog  10-55 units qAC based on pre-meal BG.  Current home blood glucose monitoring: Dexcom G7.  Hypoglycemia awareness: Yes.  Complications related to diabetes: peripheral neuropathy and ESRD on dialysis      Current Nutrition:  Active Orders   Diet    Nutrition Therapy Regular/House       ROS: As per HPI.    Scheduled Medications[1]    Current Outpatient Medications   Medication Instructions    blood-glucose meter,continuous (DEXCOM G6 RECEIVER) Misc 1 each, Miscellaneous, Daily, Dispense 1 receiver annually.  Sent to [facility]    blood-glucose sensor (DEXCOM G7 SENSOR) Devi Use to continuously monitor blood glucose.  Change sensor every 10 days.    blood-glucose transmitter (DEXCOM G6 TRANSMITTER) Devi 1 each, Miscellaneous, Every 3 months, [facility] pharmacy    blood-glucose transmitter (DEXCOM G6 TRANSMITTER) Devi Use as directed to monitor blood glucose    CHOLECALCIFEROL, VITAMIN D3, (VITAMIN D3 ORAL) 2,000 Units, Daily (standard)    ELIQUIS  5 mg, Oral, 2 times a day (standard)    gabapentin  (NEURONTIN ) 300 mg, Oral, Nightly    hydrOXYzine  (ATARAX ) 25 mg, Oral, Every 6 hours PRN    MOUNJARO  15 mg, Subcutaneous, Every 7 days    multivitamin (TAB-A-VITE/THERAGRAN) per tablet 1 tablet, Daily (standard)    mycophenolate  (CELLCEPT ) 250 mg, Oral, 2 times a day (standard)    NovoLOG  Flexpen U-100 Insulin  35 Units, Subcutaneous, 3 times a day (AC)    oxyCODONE  (ROXICODONE ) 5 mg, Oral, Every 6 hours PRN    pen needle, diabetic (TRUEPLUS PEN NEEDLE) 32 gauge x 5/32 (4 mm) Ndle Use with insulin  up to 4 times a day as needed.    pen needle, diabetic 32 gauge x 5/32 (4 mm) Ndle Use with insulin  up to 4 times/day as needed.    PROGRAF  1 mg, Oral, 2 times a day    rifAXIMin  (XIFAXAN ) 550 mg, Oral, 2 times a day (standard)    sevelamer  (RENAGEL ) 1,600 mg, 3 times a day (with meals)    TRESIBA  FLEXTOUCH U-200 60 Units, Subcutaneous, Daily (standard), Max dose of 100 units per day.    ursodiol  (ACTIGALL ) 600 mg, Oral, 2 times a day (standard)           Past Medical History[2]    Past Surgical History[3]    Family History[4]    Short Social History[5]    OBJECTIVE:  BP 144/76  - Pulse 76  - Temp 36.5 ??C (97.7 ??F) (Temporal)  - Resp 15  - Ht 177.8 cm (5' 10)  - Wt (!) 123.9 kg (273 lb 2.4 oz)  - SpO2 99%  - BMI 39.19 kg/m??   Wt Readings from Last 12 Encounters:   03/31/24 (!) 123.9 kg (273 lb 2.4 oz)   03/11/24 (!) 118.1 kg (260 lb 4.8 oz)   02/26/24 (!) 118.8 kg (262 lb)  02/05/24 (!) 119.8 kg (264 lb 1.6 oz)   01/26/24 (!) 123.2 kg (271 lb 9.7 oz)   01/24/24 (!) 122.5 kg (270 lb)   01/17/24 (!) 122.5 kg (270 lb)   01/14/24 (!) 122.8 kg (270 lb 12.8 oz)   01/11/24 (!) 117.9 kg (260 lb)   12/24/23 (!) 120.3 kg (265 lb 4.8 oz)   11/09/23 (!) 127 kg (280 lb)   10/01/23 (!) 123.4 kg (272 lb)     Physical Exam  Vitals and nursing note reviewed.   Constitutional:       General: He is not in acute distress.     Appearance: He is ill-appearing (chronically).      Comments: Resting in dialysis chair   HENT:      Head: Normocephalic.      Mouth/Throat:      Mouth: Mucous membranes are moist.   Eyes:      Extraocular Movements: Extraocular movements intact.      Conjunctiva/sclera: Conjunctivae normal.   Pulmonary:      Effort: Pulmonary effort is normal. No respiratory distress.   Skin:     General: Skin is warm and dry.   Neurological:      Mental Status: He is alert and oriented to person, place, and time.   Psychiatric:         Behavior: Behavior normal.         Thought Content: Thought content normal.           BG/insulin  reviewed per EMR.   Glucose, POC   Date Value   04/01/2024 354 mg/dL (H)   89/93/7974 546 mg/dL (HH)   89/93/7974 523 mg/dL (HH)   89/93/7974 734 mg/dL (H)   89/93/7974 844 mg/dL   90/97/7974 727 mg/dL (H)   91/72/7974 848 mg/dL   91/72/7974 825 mg/dL   92/78/7974 752 mg/dL (A)   97/74/7984 893 mg/dL   97/74/7984 872 mg/dL   90/78/7986 878 MG/DL   90/78/7986 97 MG/DL   90/79/7986 883 MG/DL   90/79/7986 874 MG/DL   90/79/7986 883 MG/DL        Summary of labs:  Lab Results   Component Value Date    A1C 10.0 (A) 01/14/2024    A1C 11.8 08/28/2023    A1C 10.5 03/01/2023     Lab Results   Component Value Date    GFR 44.92 (L) 09/03/2012    CREATININE 5.24 (H) 02/26/2024     Lab Results   Component Value Date    WBC 5.7 02/26/2024    HGB 12.5 (L) 02/26/2024    HCT 37.7 (L) 02/26/2024    PLT 174 02/26/2024       Lab Results   Component Value Date    NA 137 02/26/2024    K 4.3 03/31/2024    CL 95 (L) 02/26/2024    CO2 25.4 02/26/2024    BUN 14 02/26/2024    CREATININE 5.24 (H) 02/26/2024    GLU 261 (H) 02/26/2024    CALCIUM  8.3 (L) 02/26/2024    MG 2.2 01/26/2024    PHOS 3.7 02/03/2024       Lab Results   Component Value Date    BILITOT 0.7 02/26/2024    BILIDIR 0.30 02/03/2024    PROT 7.6 02/26/2024    ALBUMIN 3.7 02/26/2024    ALT <7 (L) 02/26/2024    AST 26 02/26/2024    ALKPHOS 168 (H) 02/26/2024    GGT 123 (H) 01/30/2024                    [  1]    acetaminophen   650 mg Oral Q6H    gabapentin   300 mg Oral Nightly    heparin  (porcine) for subcutaneous use  5,000 Units Subcutaneous Q8H SCH    insulin  glargine  60 Units Subcutaneous Nightly    insulin  lispro  35 Units Subcutaneous TID AC    insulin  lispro  0-20 Units Subcutaneous 5XD insulin     mycophenolate   250 mg Oral BID    tacrolimus   1 mg Oral BID   [2]   Past Medical History:  Diagnosis Date    CHF (congestive heart failure) (CMS-HCC)     COVID-19 07/18/2019    Diabetes mellitus (CMS-HCC)     Family history of malignant neoplasm of prostate 06/23/2015    Heart disease     Heart murmur     Hepatic cirrhosis    (CMS-HCC) 06/23/2015    Overview:  Secondary to NASH; followed by Dr. Lindaann, GI.  Last Assessment & Plan:  Relevant Hx: Course: Daily Update: Today's Plan:     Hypertension     Hypogonadism in male 02/12/2014    Kidney stone     Liver cirrhosis secondary to NASH (nonalcoholic steatohepatitis) (CMS-HCC)     s/p liver transplant 2013    Obesity    [3]   Past Surgical History:  Procedure Laterality Date    CHOLECYSTECTOMY      liver transpant      LIVER TRANSPLANTATION  06/27/2011    PR ANASTOMOSIS,AV,ANY SITE Right 02/20/2024    Procedure: ARTERIOVENOUS ANASTOMOSIS, OPEN; DIRECT, ANY SITE, UPPER EXTREMITY;  Surgeon: Marchelle Kitchens, [name];  Location: [facility] OR [facility];  Service: General Surgery    PR AV FIST REVISE GRFT,W THROMBECTOMY Right 01/15/2023    Procedure: REVISION, ARTERIOVENOUS FISTULA W/ THROMBECTOMY, AUTOGENOUS OR NONAUTOGENOUS DIALYSIS GRAFT (SEP. PROC), LOWER EXTREMITY;  Surgeon: Marchelle Kitchens, [name];  Location: [facility];  Service: General Surgery    PR COLONOSCOPY FLX DX W/COLLJ SPEC WHEN PFRMD N/A 01/24/2024    Procedure: COLONOSCOPY, FLEXIBLE, PROXIMAL TO SPLENIC FLEXURE; DIAGNOSTIC, W/WO COLLECTION SPECIMEN BY BRUSH OR WASH;  Surgeon: Erick Prentice HERO, [name];  [facility]: [facility];  Service: Gastroenterology    PR COLONOSCOPY W/BIOPSY SINGLE/MULTIPLE N/A 08/13/2018    Procedure: COLONOSCOPY, FLEXIBLE, PROXIMAL TO SPLENIC FLEXURE; WITH BIOPSY, SINGLE OR MULTIPLE;  Surgeon: Thedora Alm Plain, [name];  [facility]: [facility];  Service: Gastroenterology    PR COLSC FLX W/RMVL OF TUMOR POLYP LESION SNARE TQ N/A 08/13/2018    Procedure: COLONOSCOPY FLEX; W/REMOV TUMOR/LES BY SNARE;  Surgeon: Thedora Alm Plain, [name];  Location: [facility];  Service: Gastroenterology    PR CREAT AV FISTULA,NON-AUTOGENOUS GRAFT Right 11/29/2022    Procedure: CREATE AV FISTULA (SEPARATE PROC); NONAUTOGENOUS GRAFT (EG, BIOLOGICAL COLLAGEN, THERMOPLASTIC GRAFT), UPPER EXTREMITY;  Surgeon: Marchelle Kitchens, [name];  Location: [facility] OR [facility];  Service: General Surgery    PR UPPER GI ENDOSCOPY,BIOPSY N/A 07/13/2015    Procedure: UGI ENDOSCOPY; WITH BIOPSY, SINGLE OR MULTIPLE;  Surgeon: Elspeth Jerilynn Reek, [name];  [facility]: [facility];  Service: Gastroenterology    PR UPPER GI ENDOSCOPY,BIOPSY N/A 02/13/2023    Procedure: UGI ENDOSCOPY; WITH BIOPSY, SINGLE OR MULTIPLE;  Surgeon: Minnie Krystal Claude, [name];  Location: GI [facility];  Service: Gastroenterology    PR UPPER GI ENDOSCOPY,DIAGNOSIS N/A 01/24/2024    Procedure: UGI ENDO, INCLUDE ESOPHAGUS, STOMACH, & DUODENUM &/OR JEJUNUM; DX W/WO COLLECTION SPECIMN, BY BRUSH OR WASH;  Surgeon: Erick Prentice  M, [name];  [facility]: GI [facility];  Service: Gastroenterology   [4]   Family History  Problem Relation Age of Onset    Diabetes Father     Diabetes Paternal Grandfather     Liver disease Maternal Uncle     Cancer Maternal Grandmother     Melanoma Neg Hx     Basal cell carcinoma Neg Hx     Squamous cell carcinoma Neg Hx     Kidney disease Neg Hx    [5]   Social History  Tobacco Use    Smoking status: Never     Passive exposure: Past    Smokeless tobacco: Never   Vaping Use    Vaping status: Never Used   Substance Use Topics    Alcohol use: No    Drug use: No

## 2024-04-01 NOTE — Unmapped (Signed)
 SURGERY PROGRESS NOTE    Service Date: 04/01/2024  Admit Date: 03/31/2024, Hospital Day: 2  Hospital Service: General Surgery  Attending: Oliva Sprinkles, MD    Assessment     Richard [name] is a 58 y.o. 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  He was admitted after surgery for drain management as well as pain control given he did not receive a block due to patient preference.    Plan     Neuro:   - Scheduled Tylenol  (2 g/day given history of liver transplant), Robaxin.  - As needed oxycodone  and hydromorphone     CV: HDS     Pulm: stable on room air. IS. OOB tid  *Hypoxia  - OOB, Incentive spirometry, and Encourage ambulation    FEN/GI: Regular diet. Medlocked.  *Nausea-Zofran  as needed  *BM: Having regular BM  *Electrolytes: Replete as needed    -History of liver transplant   - Restarted home CellCept  and tacrolimus    - Pharmacy consulted for tacrolimus  trough and dosing    GU: Patient has minimal urine output, he does hemodialysis Tuesday Thursday and Saturday  -Nephrology consulted, scheduled for dialysis    Heme/ID: Afebrile. No leukocytosis.SABRA Post-op Hb 11.5.     Endo: Type 2 diabetes  - Hyperglycemic to 400s overnight given dexamethasone  before surgery  - Restarted home nutritional and basal insulins  - Endocrine consulted, appreciate recommendations    Disposition: Floor status.     -----  [name], MD  General Surgery PGY2        Subjective   Uncontrolled pain overnight requiring IV Dilaudid  for control.  Severely hyperglycemic to 476 yesterday afternoon endocrine consulted and restarted home.  He has increased blood glucoses likely in the setting of dexamethasone  yesterday morning before surgery as well.  Drain is serosanguineous and had 190 cc output over last day.     Objective     Vitals:   Temp:  [35.6 ??C (96 ??F)-36.7 ??C (98.1 ??F)] 36.7 ??C (98.1 ??F)  Pulse:  [54-94] 77  SpO2 Pulse:  [70-75] 74  Resp:  [14-40] 18  BP: (94-164)/(74-110) 164/79  MAP (mmHg):  [99-122] 120  SpO2:  [92 %-100 %] 100 %  BMI (Calculated):  [39.19] 39.19    Intake/Output last 24 hours:  I/O last 3 completed shifts:  In: 836 [P.O.:436; I.V.:400]  Out: 240 [Drains:190; Blood:50]    Physical Exam:  -General:  Appropriate, uncomfortable and in no apparent distress.   -Neurological: Moves all 4 extremities spontaneously.   -Cardiovascular: Regular rate and rhythm.  -Pulmonary: Normal work of breathing.   -Abdomen: Soft, non-tender, non-distended. No rebound or guarding.   -Extremities: Warm, well perfused. Positive, strong thrill in RUE. Drain with SS output, 190cc over last day.     Labs:  All lab results last 24 hours:    Recent Results (from the past 24 hours)   POCT Glucose    Collection Time: 03/31/24 10:55 AM   Result Value Ref Range    Glucose, POC 265 (H) 70 - 179 mg/dL   POCT Glucose    Collection Time: 03/31/24  5:04 PM   Result Value Ref Range    Glucose, POC 476 (HH) 70 - 179 mg/dL   POCT Glucose    Collection Time: 03/31/24  8:42 PM   Result Value Ref  Range    Glucose, POC 453 (HH) 70 - 179 mg/dL   POCT Glucose    Collection Time: 04/01/24  4:27 AM   Result Value Ref Range    Glucose, POC 354 (H) 70 - 179 mg/dL   CBC    Collection Time: 04/01/24  6:35 AM   Result Value Ref Range    WBC 8.7 3.6 - 11.2 10*9/L    RBC 4.00 (L) 4.26 - 5.60 10*12/L    HGB 11.5 (L) 12.9 - 16.5 g/dL    HCT 65.8 (L) 60.9 - 48.0 %    MCV 85.3 77.6 - 95.7 fL    MCH 28.6 25.9 - 32.4 pg    MCHC 33.6 32.0 - 36.0 g/dL    RDW 85.8 87.7 - 84.7 %    MPV 7.9 6.8 - 10.7 fL    Platelet 194 150 - 450 10*9/L   Comprehensive Metabolic Panel    Collection Time: 04/01/24  6:35 AM   Result Value Ref Range    Sodium 134 (L) 135 - 145 mmol/L    Potassium 6.0 (H) 3.5 - 5.1 mmol/L    Chloride 95 (L) 98 - 107 mmol/L    CO2 21.1 20.0 - 31.0 mmol/L    Anion Gap 18 (H) 5 - 14 mmol/L BUN 56 (H) 9 - 23 mg/dL    Creatinine 89.84 (H) 0.73 - 1.18 mg/dL    BUN/Creatinine Ratio 6     eGFR CKD-EPI (2021) Male 5 (L) >=60 mL/min/1.83m2    Glucose 376 (H) 70 - 179 mg/dL    Calcium  7.3 (L) 8.7 - 10.4 mg/dL    Albumin 2.9 (L) 3.4 - 5.0 g/dL    Total Protein 6.5 5.7 - 8.2 g/dL    Total Bilirubin 0.4 0.3 - 1.2 mg/dL    AST 15 <=65 U/L    ALT <7 (L) 10 - 49 U/L    Alkaline Phosphatase 132 (H) 46 - 116 U/L   POCT Glucose    Collection Time: 04/01/24  6:35 AM   Result Value Ref Range    Glucose, POC 394 (H) 70 - 179 mg/dL   POCT Glucose    Collection Time: 04/01/24  7:32 AM   Result Value Ref Range    Glucose, POC 357 (H) 70 - 179 mg/dL       Imaging:  No results found.  Radiology studies were personally reviewed    Cultures:   Microbiology Results (last day)       ** No results found for the last 24 hours. **            Scheduled Meds:Scheduled Medications[1]    Continuous Infusions:Infusions Meds[2]    PRN Meds:PRN Medications[3]     [name], MD  General Surgery PGY2  Teaching Surgeon Attestation:  I, Oliva Sprinkles, M.D., saw and evaluated the patient separate from the resident. . I have reviewed and edited the above note, and I agree with the findings and the plan of care as documented in the Resident's note. He complains of incisional pain. He is asking for IV dilaudid  and was advised that we would increase the dose of oxycodone  instead. On exam, there is no swelling and the mild preop edema has resolved. The drain output is serosanguineous and surprisingly more volume than expected.  The incision is clean and dry, looks very good. There is a palpable thrill over the outflow. There is a palpable radial pulse at the wrist. He was dialyzed today. Anticipate  discharge home with the drain tomorrow.        [1]    acetaminophen   500 mg Oral Q6H    gabapentin   300 mg Oral Nightly    heparin  (porcine) for subcutaneous use  5,000 Units Subcutaneous Q8H SCH    insulin  glargine  60 Units Subcutaneous Nightly    insulin  lispro  35 Units Subcutaneous TID AC    insulin  lispro  0-20 Units Subcutaneous 5XD insulin     insulin  NPH  10 Units Subcutaneous Once    methocarbamol  500 mg Oral QID    mycophenolate   250 mg Oral BID    tacrolimus   1 mg Oral BID   [2] [3] albumin human 25 %, calcitriol , dextrose  in water, gentamicin -sodium citrate , gentamicin -sodium citrate , glucagon , glucose, heparin  (porcine), heparin  (porcine), hydrOXYzine , ondansetron  **OR** ondansetron , oxyCODONE  **OR** oxyCODONE , sodium chloride  0.9%

## 2024-04-01 NOTE — Unmapped (Signed)
 Tacrolimus  Therapeutic Monitoring Pharmacy Note    Richard [name] is a 58 y.o. male continuing tacrolimus .     Indication: Liver transplant     Date of Transplant: 02/2012      Prior Dosing Information: Home regimen 1 mg PO BID     Source(s) of information used to determine prior to admission dosing: Clinic Note    Goals:  Therapeutic Drug Levels  Tacrolimus  trough goal: 2-4 ng/mL    Additional Clinical Monitoring/Outcomes  Monitor renal function (SCr and urine output) and liver function (LFTs)  Monitor for signs/symptoms of adverse events (e.g., hyperglycemia, hyperkalemia, hypomagnesemia, hypertension, headache, tremor)    Previous Lab Values  Tacrolimus , Trough   Date/Time Value Ref Range Status   04/01/2024 06:35 AM 4.5 (L) 5.0 - 15.0 ng/mL Final   02/03/2024 04:50 AM 6.2 5.0 - 15.0 ng/mL Final   02/02/2024 08:34 AM 5.2 5.0 - 15.0 ng/mL Final   02/01/2024 04:19 AM 6.4 5.0 - 15.0 ng/mL Final   01/31/2024 05:26 AM 5.7 5.0 - 15.0 ng/mL Final   10/06/2014 09:00 AM 6.8 <=20.0 ng/mL Final   08/31/2014 08:34 AM 4.2  Final   08/03/2014 08:22 AM 4.7  Final   07/07/2014 08:37 AM 4.2  Final   06/09/2014 08:27 AM 3.5  Final       Result:  Tacrolimus  level from today was drawn appropriately     Pharmacokinetic Considerations and Significant Drug Interactions:  Concurrent CYP3A4 substrates/inhibitors: None identified    Assessment/Plan:  Recommendedation(s)  Continue current regimen of 1 mg BID    Follow-up  Next level to be determined by primary team.   A pharmacist will continue to monitor and recommend levels as appropriate    Please page service pharmacist with questions/clarifications.    Eva JONELLE Minerva, PharmD

## 2024-04-01 NOTE — Unmapped (Signed)
 HEMODIALYSIS NURSE PROCEDURE NOTE       Treatment Number:  1 Room / Station:  2    Procedure Date:  04/01/24 Device Name/Number: Elsa    Total Dialysis Treatment Time:  325 Min.    CONSENT:    Written consent was obtained prior to the procedure and is detailed in the medical record.  Prior to the start of the procedure, a time out was taken and the identity of the patient was confirmed via name, medical record number and date of birth.     WEIGHT:   Date/Time Pre-Treatment Weight (kg) Estimated Dry Weight (kg) Patient Goal Weight (kg) Total Goal Weight (kg)    04/01/24 0834 126.6 kg (279 lb 1.6 oz)  118.5 kg (261 lb 3.9 oz)  4 kg (8 lb 13.1 oz)  4.55 kg (10 lb 0.5 oz)        Date/Time Post-Treatment Weight (kg) Treatment Weight Change (kg)    04/01/24 1415 121.7 kg (268 lb 4.8 oz)  -4.91 kg      Active Dialysis Orders (168h ago, onward)       Start     Ordered    04/01/24 1135  Hemodialysis inpatient  Every Tue,Thu,Sat      Question Answer Comment   Patient HD Status: Chronic    New Start? No    K+ 2 meq/L    Ca++ 2.5 meq/L    Bicarb 35 meq/L    Na+ 137 meq/L    Na+ Modeling no    Dialyzer [id number]    Dialysate Temperature (C) 36.5    BFR-As tolerated to a maximum of: 400 mL/min    DFR 800 mL/min    Duration of treatment 4 Hr    Dry weight (kg) 118.5kg    Challenge dry weight (kg) no    Fluid removal (L) to EDW    Tubing Adult = 142 ml    Access Site Dialysis Catheter    Access Site Location Left    Keep SBP >:        04/01/24 1135    03/31/24 1817  Hemodialysis inpatient  Every Tue,Thu,Sat,   Status:  Canceled      Question Answer Comment   Patient HD Status: Chronic    New Start? No    K+ 3 meq/L    Ca++ 2.5 meq/L    Bicarb 35 meq/L    Na+ 137 meq/L    Na+ Modeling no    Dialyzer [id number]    Dialysate Temperature (C) 36.5    BFR-As tolerated to a maximum of: 400 mL/min    DFR 800 mL/min    Duration of treatment 4 Hr    Dry weight (kg) 118.5kg    Challenge dry weight (kg) no    Fluid removal (L) to EDW Tubing Adult = 142 ml    Access Site Dialysis Catheter    Access Site Location Left    Keep SBP >:        03/31/24 1816                  ASSESSMENT:  General appearance: alert  Neurologic: Grossly normal  Lungs: clear to auscultation bilaterally  Heart: regular rate and rhythm, S1, S2 normal, no murmur, click, rub or gallop      ACCESS SITE:       Hemodialysis Catheter 02/01/24 Venovenous catheter Left Internal jugular (Active)   Site Assessment Clean;Dry;Intact 04/01/24 1435   Proximal Lumen Status /  Patency Gentamicin  Citrate Locked 04/01/24 1435   Proximal Lumen Intervention Deaccessed 04/01/24 1435   Medial Lumen Status / Patency Gentamicin  Citrate Locked 04/01/24 1435   Medial Lumen Intervention Deaccessed 04/01/24 1435   Dressing Intervention No intervention needed 04/01/24 1435   Dressing Status      Clean;Dry;Intact/not removed 04/01/24 1435   Verification by X-ray Yes 04/01/24 0830   Site Condition No complications 04/01/24 1435   Dressing Type CHG gel;Occlusive 04/01/24 1435   Dressing Change Due 04/08/24 04/01/24 1435   Line Necessity Reviewed? Y 04/01/24 1435   Line Necessity Indications Yes - Hemodialysis 04/01/24 1435   Line Necessity Reviewed With Nephrology 04/01/24 1435           Catheter fill volumes:    Arterial: 2.5 mL Venous: 2.5 mL   Catheter filled with Gentamicin  citrate locks post procedure.     Patient Lines/Drains/Airways Status       Active Peripheral & Central Intravenous Access       Name Placement date Placement time Site Days    Peripheral IV 03/31/24 Left;Posterior Forearm 03/31/24  0656  Forearm  1    Hemodialysis Catheter 02/01/24 Venovenous catheter Left Internal jugular 02/01/24  1008  Internal jugular  60                   LAB RESULTS:  Lab Results   Component Value Date    NA 134 (L) 04/01/2024    K 6.0 (H) 04/01/2024    CL 95 (L) 04/01/2024    CO2 21.1 04/01/2024    BUN 56 (H) 04/01/2024    CREATININE 10.15 (H) 04/01/2024    GLU 376 (H) 04/01/2024    GLUF 138 10/06/2014    CALCIUM  7.3 (L) 04/01/2024    CAION 3.67 (L) 02/02/2024    ICAV 4.88 03/04/2012    PHOS 3.7 02/03/2024    MG 2.2 01/26/2024    PTH 267.5 (H) 10/14/2021    IRON 95 11/09/2022    LABIRON 36 11/09/2022    TRANSFERRIN 212.0 (L) 11/09/2022    FERRITIN 169.8 11/09/2022    TIBC 267.1 11/09/2022     Lab Results   Component Value Date    WBC 8.7 04/01/2024    HGB 11.5 (L) 04/01/2024    HCT 34.1 (L) 04/01/2024    PLT 194 04/01/2024    PHART 7.36 03/04/2012    PO2ART 88 03/04/2012    PCO2ART 43 03/04/2012    HCO3ART 23.4 03/04/2012    BEART -1.3 03/04/2012    O2SATART 98.1 03/04/2012    PTINR : 05/06/2012    APTT 290.0 (HH) 01/19/2024        VITAL SIGNS:    Date/Time Temp Temp src       04/01/24 1417 36.7 ??C (98 ??F)  Oral        Date/Time Pulse BP MAP (mmHg) Patient Position    04/01/24 1417 55  130/89  105  Sitting     04/01/24 1410 91  141/99  --  Sitting     04/01/24 1400 71  144/93  --  Sitting     04/01/24 1330 86  125/87  --  Sitting     04/01/24 1300 82  112/96  --  Sitting     04/01/24 1230 79  172/88  --  Sitting     04/01/24 1200 72  154/54  --  Sitting     04/01/24 1130 85  123/86  --  Sitting  04/01/24 1100 56  165/78  --  Sitting     04/01/24 1042 77  164/79  --  Sitting       Date/Time Blood Volume Change (%) HCT HGB Critline O2 SAT %    04/01/24 1417 -8.2 %  38.2  13  64.9     04/01/24 1410 -9.5 %  38.8  13  56.7     04/01/24 1400 -9.1 %  38.6  --  62.5     04/01/24 1330 -6.5 %  37.6  --  63.1     04/01/24 1300 -5.7 %  37.2  --  57.8     04/01/24 1230 -5.1 %  37  --  57.3     04/01/24 1200 -2.7 %  36.1  --  63.3     04/01/24 1130 -3.9 %  36.5  --  61     04/01/24 1100 -0.4 %  35.3  --  61.3     04/01/24 1042 7.3 %  32.7  --  55.9       Date/Time Resp SpO2 O2 Device O2 Flow Rate (L/min)    04/01/24 1417 17  --  None (Room air)  --     04/01/24 1410 17  --  None (Room air)  --     04/01/24 1400 18  --  None (Room air)  --     04/01/24 1330 18  --  None (Room air)  --     04/01/24 1300 17  --  None (Room air)  --     04/01/24 1230 18  --  None (Room air)  --     04/01/24 1200 17  --  None (Room air)  --     04/01/24 1130 18  --  None (Room air)  --     04/01/24 1100 17  --  None (Room air)  --     04/01/24 1042 18  --  None (Room air)  --         Date/Time Therapy Number Dialyzer Hemodialysis Line Type All Machine Alarms Passed    04/01/24 0834 1  F-180 (98 mLs)  Adult (142 m/s)  Yes       Date/Time Air Detector Saline Line Double Clampled Hemo-Safe Applied Dialysis Flow (mL/min)    04/01/24 0834 Engaged  --  --  800 mL/min       Date/Time Verify Priming Solution Priming Volume Hemodialysis Independent pH Hemodialysis Machine Conductivity (mS/cm)    04/01/24 0834 0.9% NS  300 mL  --  13.9 mS/cm       Date/Time Hemodialysis Independent Conductivity (mS/cm) Bicarb Conductivity Residual Bleach Negative Total Chlorine    04/01/24 0834 13.7 mS/cm  -- No (Comment)  0       Date/Time Pre-Hemodialysis Comments    04/01/24 0834 Alert, arrived in [facility]       Date/Time Blood Flow Rate (mL/min) Arterial Pressure (mmHg) Venous Pressure (mmHg) Transmembrane Pressure (mmHg)    04/01/24 1417 0 mL/min  50 mmHg  7 mmHg  17 mmHg     04/01/24 1410 150 mL/min  -226 mmHg  190 mmHg  -14 mmHg     04/01/24 1400 350 mL/min  -225 mmHg  188 mmHg  -10 mmHg     04/01/24 1330 350 mL/min  -214 mmHg  162 mmHg  16 mmHg     04/01/24 1300 350 mL/min  -208 mmHg  167 mmHg  20 mmHg     04/01/24  1230 350 mL/min  -213 mmHg  161 mmHg  16 mmHg     04/01/24 1200 350 mL/min  -212 mmHg  158 mmHg  15 mmHg     04/01/24 1130 350 mL/min  -262 mmHg  166 mmHg  11 mmHg     04/01/24 1100 375 mL/min  -254 mmHg  172 mmHg  5 mmHg     04/01/24 1042 400 mL/min  -248 mmHg  173 mmHg  1 mmHg     04/01/24 0921 250 mL/min  -218 mmHg  124 mmHg  34 mmHg     04/01/24 0900 350 mL/min   BFR reduced. Multiple red alarms  -236 mmHg  177 mmHg  13 mmHg     04/01/24 0845 400 mL/min  -260 mmHg  150 mmHg  40 mmHg     04/01/24 0841 200 mL/min  48 mmHg  58 mmHg  10 mmHg       Date/Time Ultrafiltration Rate (mL/hr) Ultrafiltrate Removed (mL) Dialysate Flow Rate (mL/min) KECN (Kecn)    04/01/24 1417 0 mL/hr  5060 mL  150 ml/min  --     04/01/24 1410 300 mL/hr  5051 mL  800 ml/min  --     04/01/24 1400 1310 mL/hr  4835 mL  800 ml/min  --     04/01/24 1330 1330 mL/hr  4192 mL  800 ml/min  --     04/01/24 1300 1330 mL/hr  3530 mL  800 ml/min  --     04/01/24 1230 1330 mL/hr  2874 mL  800 ml/min  --     04/01/24 1200 1330 mL/hr  2214 mL  800 ml/min  --     04/01/24 1130 1140 mL/hr  1569 mL  800 ml/min  --     04/01/24 1100 1140 mL/hr  1022 mL  800 ml/min  --     04/01/24 1042 1140 mL/hr  690 mL  800 ml/min  --     04/01/24 0921 1140 mL/hr  633 mL  800 ml/min  --     04/01/24 0900 1140 mL/hr  247 mL  800 ml/min  --     04/01/24 0845 20 mL/hr  0 mL  800 ml/min  --     04/01/24 0841 0 mL/hr  0 mL  800 ml/min  --       Date/Time Intra-Hemodialysis Comments    04/01/24 1417 post HD vitals     04/01/24 1410 treatment terminated, rinse back given     04/01/24 1400 pt stable     04/01/24 1330 pt tolerating tx     04/01/24 1300 pt stable     04/01/24 1230 pt tolerating tx     04/01/24 1200 pt talking on the phone, NAD     04/01/24 1130 pt stable     04/01/24 1100 pt tolerating tx, still getting high Ap alarms, 350 bfr     04/01/24 1042 TPA, minimal blood return of arterial, treatment resumed     04/01/24 0921 seen by Dr Lanice, high Ap alarms and 250 bfr, ordered to tpa lines for 1 hour, tpa dwelling     04/01/24 0900 HD cont     04/01/24 0845 HD started  Lines reversed on start.       Date/Time Rinseback Volume (mL) On Line Clearance: spKt/V Total Liters Processed (L/min) Dialyzer Clearance    04/01/24 1415 300 mL  0.75 spKt/V  76.8 L/min  Moderately streaked       Date/Time Post-Hemodialysis Comments  04/01/24 1415 Alert and stable       Date/Time Total Hemodialysis Replacement Volume (mL) Total Ultrafiltrate Output (mL)    04/01/24 1415 --  4000 mL        3H05-3H05-01 - Medicaitons Given During Treatment (last 5 hrs)           Nandana Krolikowski, RN         Medication Name Action Time Action Route Rate Dose User     calcitriol  (ROCALTROL ) capsule 0.25 mcg 04/01/24 1422 Given Oral  0.25 mcg Jeannie Patches, RN     gentamicin -sodium citrate  lock solution in NS 04/01/24 1422 Given Intra-cannular  2.5 mL Jeannie Patches, RN     gentamicin -sodium citrate  lock solution in NS 04/01/24 1421 Given Intra-cannular  2.5 mL Jeannie Patches, RN     heparin  (porcine) 1000 unit/mL injection 1,000 Units 04/01/24 1100 Given Dialysis Circuit  1,000 Units Jeannie Patches, RN                      Patient tolerated treatment in a  Dialysis Recliner.

## 2024-04-01 NOTE — Unmapped (Signed)
 4 hours Hd today with uf goal of 4 liters as patient tolerates  Problem: Hemodialysis  Goal: Safe, Effective Therapy Delivery  Outcome: Ongoing - Unchanged  Goal: Effective Tissue Perfusion  Outcome: Ongoing - Unchanged  Goal: Absence of Infection Signs and Symptoms  Outcome: Ongoing - Unchanged

## 2024-04-01 NOTE — Unmapped (Addendum)
 Patient is seen in consultation at the request of Oliva Sprinkles, MD with General Surgery for possible exchange of a central venous access device.    Assessment & Recommendations     Richard [name] is a 58 y.o. 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  He was admitted after surgery for drain management as well as pain control given he did not receive a block due to patient preference. VIR consulted for HD line malfunction with low flows in HD today; consideration of tunneled line exchange.     Left IJV tunneled HD line initially placed 01/18/2024 following failed declot of right AVF declot, last exchanged on 02/01/2024 by VIR (no other intervention performed)-14.5 French x 28 cm palindrome. This exchange was done with GA due to altered mental status.     - VIR recommends venogram with possible intervention and exchange of a LIJV tunneled hemodialysis catheter.  - Anticipated procedure date: 10/8, pending VIR schedule  - Sedation plan: Moderate sedation. Richard [name] and this provider had a long discussion regarding moderate sedation VS general anesthesia. His line insertion and exchanges have all been done with GA, however he did have a liver biopsy done with moderate sedation and said that was tolerable. He is incredibly nervous about moderate sedation with this procedure, but is willing to try given the need to transfer to [facility] main to have it with GA, and the fact that he does not want to be intubated. Discussed utiliizing pre-meds for anxiety with patient and his primary team. Will plan for 10 mg valium  ~1 hour prior to procedure 10/8 ~1000.   - NPO at midnight the night prior to procedure.  - This is a low bleeding risk procedure.  INR <= 3.0  Platelets >= 20  Anticoagulation does not need to be held.  - Please obtain CMP to assess potassium level. Per [facility] hyperkalemia policy, K needs to be <5.5 prior to intervention. If elevated, K will need to be corrected prior to intervention.  - Iodinated contrast allergy: No.  - Antibiotics needed: Ancef  2 g IV (body weight 80-120 kg)    Pending blood cultures: No. Per [facility] policy, blood cultures must be negative for 48 hours prior to placing a tunneled central venous catheter.    This plan was discussed with Dr. Gloria Salazar.    Informed Consent  Yes; this procedure has been fully reviewed with the patient. This included a discussion of the risks, benefits, and alternatives including but not limited to bleeding, infection, and/or damage to surrounding structures. All questions were answered to their satisfaction, and they would like to proceed with the procedure.   --The patient will accept blood products in an emergent situation.  --The patient does not have a Do Not Resuscitate order in effect.    Consent obtained by Rocky Reeve, ACNP, witnessed, and scanned into patient's chart (see media tab).    Thank you for involving us  in the care of this patient. Please page the VIR consult pager [phone]) with further questions, concerns, or if new issues arise.     Subjective     History of Present Illness:  Richard [name] is a 58 y.o. 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  He was admitted after surgery for drain management as well as pain control given he did not receive a block due to patient preference. VIR consulted for HD line malfunction with low flows in HD today; consideration of tunneled line exchange.     Left IJV tunneled HD line initially placed 01/18/2024 following failed declot of right AVF declot, last exchanged on 02/01/2024 by VIR (no other intervention performed).  This exchange was done with GA due to altered mental status.     Afebrile. No pending blood cx. No leukocytosis  Normal PLT. On treatmen heparin  subcutaneous.   Potassium 6 today       Review of Systems: Pertinent items are noted in HPI.    Past Medical History:  Past Medical History[1]    Past Surgical History:  Past Surgical History[2]    Allergies:  Allergies[3]     Objective     Physical Exam:  Vitals:    04/01/24 1417   BP: 130/89   Pulse: 55   Resp: 17   Temp: 36.7 ??C (98 ??F)   SpO2:         General: alert, anxious  Respiratory: breathing comfortably on room air  Neurologic: grossly normal, alert & oriented x3    Airway assessment: Class 2 - Can visualize soft palate and fauces, tip of uvula is obscured    ASA 3 - Patient with moderate systemic disease with functional limitations    Pertinent Labs:  Recent Labs     04/01/24  0635   WBC 8.7   HGB 11.5*   HCT 34.1*   PLT 194       Recent Labs     03/31/24  0553 04/01/24  0635   NA  --  134*   K 4.3 6.0*   CL  --  95*   BUN  --  56*   CREATININE  --  10.15*   GLU  --  376*   Estimated Creatinine Clearance: 10.7 mL/min (A) (based on SCr of 10.15 mg/dL (H)).     No results for input(s): INR, APTT, PTINR, FIBRINOGEN in the last 72 hours.    Recent Labs     04/01/24  0635   PROT 6.5   ALBUMIN 2.9*   AST 15   ALT <7*   ALKPHOS 132*   BILITOT 0.4       Lab Results   Component Value Date    LABBLOO No Growth at 5 days 01/26/2024    LABBLOO No Growth at 5 days 01/26/2024    LABBLOO No Growth at 5 days 01/16/2024    LABBLOO No Growth at 5 days 01/16/2024       Pertinent Imaging: No relevant imaging was available for review.           [1]   Past Medical History:  Diagnosis Date    CHF (congestive heart failure) (CMS-HCC)     COVID-19 07/18/2019    Diabetes mellitus (CMS-HCC)     Family history of malignant neoplasm of prostate 06/23/2015    Heart disease     Heart murmur     Hepatic cirrhosis    (CMS-HCC) 06/23/2015    Overview:  Secondary to NASH; followed by Dr. Lindaann, GI. Last Assessment & Plan:  Relevant Hx: Course: Daily Update: Today's Plan:     Hypertension     Hypogonadism in male 02/12/2014    Kidney stone  Liver cirrhosis secondary to NASH (nonalcoholic steatohepatitis) (CMS-HCC)     s/p liver transplant 2013    Obesity    [2]   Past Surgical History:  Procedure Laterality Date    CHOLECYSTECTOMY      liver transpant      LIVER TRANSPLANTATION  06/27/2011    PR ANASTOMOSIS,AV,ANY SITE Right 02/20/2024    Procedure: ARTERIOVENOUS ANASTOMOSIS, OPEN; DIRECT, ANY SITE, UPPER EXTREMITY;  Surgeon: Marchelle Kitchens, MD;  Location[facility];  Service: General Surgery    PR AV ANAST,UP ARM BASILIC VEIN TRANSPOSIT Right 03/31/2024    Procedure: ARTERIOVENOUS ANASTOMOSIS, OPEN; BY UPPER ARM BASILIC VEIN TRANSPOSITION;  Surgeon: Marchelle Kitchens, MD;  Location: [facility] OR [facility];  Service: General Surgery    PR AV FIST REVISE GRFT,W THROMBECTOMY Right 01/15/2023    Procedure: REVISION, ARTERIOVENOUS FISTULA W/ THROMBECTOMY, AUTOGENOUS OR NONAUTOGENOUS DIALYSIS GRAFT (SEP. PROC), LOWER EXTREMITY;  Surgeon: Marchelle Kitchens, MD;  Location: [facility] OR [facility];  Service: General Surgery    PR COLONOSCOPY FLX DX W/COLLJ SPEC WHEN PFRMD N/A 01/24/2024    Procedure: COLONOSCOPY, FLEXIBLE, PROXIMAL TO SPLENIC FLEXURE; DIAGNOSTIC, W/WO COLLECTION SPECIMEN BY BRUSH OR WASH;  Surgeon: Erick Prentice HERO, MD;  Location: [facility];  Service: Gastroenterology    PR COLONOSCOPY W/BIOPSY SINGLE/MULTIPLE N/A 08/13/2018    Procedure: COLONOSCOPY, FLEXIBLE, PROXIMAL TO SPLENIC FLEXURE; WITH BIOPSY, SINGLE OR MULTIPLE;  Surgeon: Thedora Alm Plain, MD;  Location: [facility];  Service: Gastroenterology    PR COLSC FLX W/RMVL OF TUMOR POLYP LESION SNARE TQ N/A 08/13/2018    Procedure: COLONOSCOPY FLEX; W/REMOV TUMOR/LES BY SNARE;  Surgeon: Thedora Alm Plain, MD;  [facility]: [facility];  Service: Gastroenterology    PR CREAT AV FISTULA,NON-AUTOGENOUS GRAFT Right 11/29/2022    Procedure: CREATE AV FISTULA (SEPARATE PROC); NONAUTOGENOUS GRAFT (EG, BIOLOGICAL COLLAGEN, THERMOPLASTIC GRAFT), UPPER EXTREMITY;  Surgeon: Marchelle Kitchens, MD;  Location: [facility] OR [facility];  Service: General Surgery    PR UPPER GI ENDOSCOPY,BIOPSY N/A 07/13/2015    Procedure: UGI ENDOSCOPY; WITH BIOPSY, SINGLE OR MULTIPLE;  Surgeon: Elspeth Jerilynn Reek, MD;  Location: [facility];  Service: Gastroenterology    PR UPPER GI ENDOSCOPY,BIOPSY N/A 02/13/2023    Procedure: UGI ENDOSCOPY; WITH BIOPSY, SINGLE OR MULTIPLE;  Surgeon: Minnie Krystal Claude, MD;  Location: [facility];  Service: Gastroenterology    PR UPPER GI ENDOSCOPY,DIAGNOSIS N/A 01/24/2024    Procedure: UGI ENDO, INCLUDE ESOPHAGUS, STOMACH, & DUODENUM &/OR JEJUNUM; DX W/WO COLLECTION SPECIMN, BY BRUSH OR WASH;  Surgeon: Erick Prentice HERO, MD;  Location: GI [facility];  Service: Gastroenterology   [3] No Known Allergies

## 2024-04-02 LAB — CBC
HEMATOCRIT: 34.7 % — ABNORMAL LOW (ref 39.0–48.0)
HEMOGLOBIN: 11.5 g/dL — ABNORMAL LOW (ref 12.9–16.5)
MEAN CORPUSCULAR HEMOGLOBIN CONC: 33.1 g/dL (ref 32.0–36.0)
MEAN CORPUSCULAR HEMOGLOBIN: 28.2 pg (ref 25.9–32.4)
MEAN CORPUSCULAR VOLUME: 85.2 fL (ref 77.6–95.7)
MEAN PLATELET VOLUME: 8 fL (ref 6.8–10.7)
PLATELET COUNT: 155 10*9/L (ref 150–450)
RED BLOOD CELL COUNT: 4.07 10*12/L — ABNORMAL LOW (ref 4.26–5.60)
RED CELL DISTRIBUTION WIDTH: 13.9 % (ref 12.2–15.2)
WBC ADJUSTED: 8.6 10*9/L (ref 3.6–11.2)

## 2024-04-02 LAB — COMPREHENSIVE METABOLIC PANEL
ALBUMIN: 3.1 g/dL — ABNORMAL LOW (ref 3.4–5.0)
ALKALINE PHOSPHATASE: 149 U/L — ABNORMAL HIGH (ref 46–116)
ALT (SGPT): <7 U/L — ABNORMAL LOW (ref 10–49)
ANION GAP: 17 mmol/L — ABNORMAL HIGH (ref 5–14)
AST (SGOT): 20 U/L (ref <=34)
BILIRUBIN TOTAL: 0.4 mg/dL (ref 0.3–1.2)
BLOOD UREA NITROGEN: 27 mg/dL — ABNORMAL HIGH (ref 9–23)
BUN / CREAT RATIO: 4
CALCIUM: 7.4 mg/dL — ABNORMAL LOW (ref 8.7–10.4)
CHLORIDE: 98 mmol/L (ref 98–107)
CO2: 22.4 mmol/L (ref 20.0–31.0)
CREATININE: 7.48 mg/dL — ABNORMAL HIGH (ref 0.73–1.18)
EGFR CKD-EPI (2021) MALE: 8 mL/min/1.73m2 — ABNORMAL LOW (ref >=60)
GLUCOSE RANDOM: 188 mg/dL — ABNORMAL HIGH (ref 70–179)
POTASSIUM: 5 mmol/L — ABNORMAL HIGH (ref 3.4–4.8)
PROTEIN TOTAL: 6.9 g/dL (ref 5.7–8.2)
SODIUM: 137 mmol/L (ref 135–145)

## 2024-04-02 MED ADMIN — menthol (COUGH DROPS) lozenge 1 lozenge: 1 | ORAL | @ 02:00:00

## 2024-04-02 MED ADMIN — methocarbamol (ROBAXIN) tablet 500 mg: 500 mg | ORAL

## 2024-04-02 MED ADMIN — methocarbamol (ROBAXIN) tablet 500 mg: 500 mg | ORAL | @ 21:00:00 | Stop: 2024-04-02

## 2024-04-02 MED ADMIN — tacrolimus (PROGRAF) capsule 1 mg: 1 mg | ORAL | @ 12:00:00 | Stop: 2024-04-02

## 2024-04-02 MED ADMIN — mycophenolate (CELLCEPT) capsule 250 mg: 250 mg | ORAL

## 2024-04-02 MED ADMIN — oxyCODONE (ROXICODONE) immediate release tablet 15 mg: 15 mg | ORAL | @ 07:00:00 | Stop: 2024-04-02

## 2024-04-02 MED ADMIN — heparin (porcine) 1000 unit/mL injection: INTRAVENOUS | @ 16:00:00 | Stop: 2024-04-02

## 2024-04-02 MED ADMIN — gentamicin-sodium citrate lock solution in NS: 2.1 mL | @ 20:00:00 | Stop: 2024-04-02

## 2024-04-02 MED ADMIN — oxyCODONE (ROXICODONE) immediate release tablet 15 mg: 15 mg | ORAL | @ 02:00:00 | Stop: 2024-04-06

## 2024-04-02 MED ADMIN — insulin lispro (HumaLOG) injection CORRECTIONAL 0-20 Units: 0-20 [IU] | SUBCUTANEOUS | @ 12:00:00 | Stop: 2024-04-02

## 2024-04-02 MED ADMIN — ceFAZolin (ANCEF) 3 g in sodium chloride 0.9 % (NS) 100 mL IVPB-MBP: 3 g | INTRAVENOUS | @ 16:00:00 | Stop: 2024-04-02

## 2024-04-02 MED ADMIN — insulin lispro (HumaLOG) injection CORRECTIONAL 0-20 Units: 0-20 [IU] | SUBCUTANEOUS | @ 07:00:00 | Stop: 2024-04-02

## 2024-04-02 MED ADMIN — diazePAM (VALIUM) tablet 10 mg: 10 mg | ORAL | @ 15:00:00 | Stop: 2024-04-02

## 2024-04-02 MED ADMIN — oxyCODONE (ROXICODONE) immediate release tablet 15 mg: 15 mg | ORAL | @ 14:00:00 | Stop: 2024-04-02

## 2024-04-02 MED ADMIN — iohexol (OMNIPAQUE) 300 mg iodine/mL solution 10 mL: 10 mL | @ 17:00:00 | Stop: 2024-04-02

## 2024-04-02 MED ADMIN — heparin (porcine) 1000 unit/mL injection 1,000 Units: 1000 [IU] | @ 19:00:00 | Stop: 2024-04-02

## 2024-04-02 MED ADMIN — heparin (porcine) 5,000 unit/mL injection 5,000 Units: 5000 [IU] | SUBCUTANEOUS | @ 17:00:00 | Stop: 2024-04-02

## 2024-04-02 MED ADMIN — insulin lispro (HumaLOG) inj PERCENTAGE MEAL EATEN 15 Units: 15 [IU] | SUBCUTANEOUS | Stop: 2024-04-01

## 2024-04-02 MED ADMIN — lidocaine (XYLOCAINE) 10 mg/mL (1 %) injection: SUBCUTANEOUS | @ 16:00:00 | Stop: 2024-04-02

## 2024-04-02 MED ADMIN — insulin lispro (HumaLOG) injection CORRECTIONAL 0-20 Units: 0-20 [IU] | SUBCUTANEOUS | @ 22:00:00 | Stop: 2024-04-02

## 2024-04-02 MED ADMIN — midazolam (VERSED) injection: INTRAVENOUS | @ 16:00:00 | Stop: 2024-04-02

## 2024-04-02 MED ADMIN — tacrolimus (PROGRAF) capsule 1 mg: 1 mg | ORAL

## 2024-04-02 MED ADMIN — insulin glargine (LANTUS) injection BASAL 60 Units: 60 [IU] | SUBCUTANEOUS

## 2024-04-02 MED ADMIN — insulin lispro (HumaLOG) injection 40 Units: 40 [IU] | SUBCUTANEOUS | @ 18:00:00 | Stop: 2024-04-02

## 2024-04-02 MED ADMIN — mycophenolate (CELLCEPT) capsule 250 mg: 250 mg | ORAL | @ 12:00:00 | Stop: 2024-04-02

## 2024-04-02 MED ADMIN — midazolam (VERSED) injection 2 mg: 2 mg | INTRAVENOUS | @ 16:00:00 | Stop: 2024-04-02

## 2024-04-02 MED ADMIN — gabapentin (NEURONTIN) capsule 300 mg: 300 mg | ORAL

## 2024-04-02 MED ADMIN — insulin lispro (HumaLOG) injection CORRECTIONAL 0-20 Units: 0-20 [IU] | SUBCUTANEOUS

## 2024-04-02 NOTE — Unmapped (Addendum)
 58 year old male with past medical history of [name] cirrhosis status post transplant (03/15/2012), type 2 diabetes mellitus, HFpEF, end-stage renal disease due to CNI toxicity and hypertension (on hemodialysis Tuesday/Thursday/Saturday), hypertension, dyslipidemia who presented to the [facility] ED on 03/31/2024 for elective surgery.  He underwent revision of arteriovenous fistula right arm with basilic vein transposition with drainage of postoperative seroma with 10 round fluted Blake drain was placed on 03/31/2024 with Dr. Marchelle.    He was admitted afterwards for pain control and drain care. He did not receive a pre-operative block due to patient preference.    On hospital day #2, endocrinology was consulted for hyperglycemia and poorly controlled diabetes. Home nutritional and basal insulins were restarted.  Nephrology was consulted. Dialysis was performed via right IJ tunneled catheter.  There was some difficulty running.  VIR was consulted for evaluation of line malfunction.     On hospital day #3, he underwent exchange of a left internal jugular tunneled HD catheter with fibrin sheath angioplasty with VIR.  He underwent dialysis today in the [facility] was functioning well.    Recommend follow-up in clinic in 1 week for drain removal.

## 2024-04-02 NOTE — Unmapped (Signed)
 [facility] Nephrology Hemodialysis Procedure Note     04/02/2024    Richard [name] was seen and examined on hemodialysis    CHIEF COMPLAINT: End Stage Renal Disease    INTERVAL HISTORY: Had CVC exchanged this morning - functioning well. Brought to HD today for 2hr DUF session given he is so far above his EDW.    DIALYSIS TREATMENT DATA:  Estimated Dry Weight (kg): 118.5 kg (261 lb 3.9 oz) Patient Goal Weight (kg): 2 kg (4 lb 6.6 oz) (2-3 kg as tol)   Pre-Treatment Weight (kg): 123 kg (271 lb 2.7 oz)    Dialysis Bath  Bath:  (duf)  Dialysate Na (mEq/L):  (duf)  Dialysate HCO3 (mEq/L):  (duf) Dialyzer: F-180 (98 mLs)   Blood Flow Rate (mL/min): 400 mL/min Dialysis Flow (mL/min): 800 mL/min   Machine Temperature (C): 36.5 ??C (97.7 ??F)      PHYSICAL EXAM:  Vitals:  Temp:  [36.3 ??C (97.3 ??F)-36.9 ??C (98.5 ??F)] 36.3 ??C (97.3 ??F)  Pulse:  [63-85] 70  BP: (128-162)/(77-107) 134/80  MAP (mmHg):  [93-113] 113    General: in no acute distress, currently dialyzing in a Hemodialysis Recliner  Pulmonary: normal respiratory effort  Cardiovascular: regular rate and rhythm  Extremities: 1+  edema  Access: Left IJ tunneled catheter     LAB DATA:  Lab Results   Component Value Date    NA 137 04/02/2024    K 5.0 (H) 04/02/2024    CL 98 04/02/2024    CO2 22.4 04/02/2024    BUN 27 (H) 04/02/2024    CREATININE 7.48 (H) 04/02/2024    GLUCOSE 196 03/03/2012    CALCIUM  7.4 (L) 04/02/2024    MG 2.2 01/26/2024    PHOS 3.7 02/03/2024    ALBUMIN 3.1 (L) 04/02/2024      Lab Results   Component Value Date    HCT 34.7 (L) 04/02/2024    WBC 8.6 04/02/2024        ASSESSMENT/PLAN:  End Stage Renal Disease on Intermittent Hemodialysis:  UF goal: 3L as tolerated  Adjust medications for a GFR <10  Avoid nephrotoxic agents  Last HD Treatment:Started (04/02/24)     Bone Mineral Metabolism:  Lab Results   Component Value Date    CALCIUM  7.4 (L) 04/02/2024    CALCIUM  7.3 (L) 04/01/2024    Lab Results   Component Value Date    ALBUMIN 3.1 (L) 04/02/2024    ALBUMIN 2.9 (L) 04/01/2024      Lab Results   Component Value Date    PHOS 3.7 02/03/2024    PHOS 5.1 02/02/2024    Lab Results   Component Value Date    PTH 267.5 (H) 10/14/2021      Labs appropriate, no changes.    Anemia:   Lab Results   Component Value Date    HGB 11.5 (L) 04/02/2024    HGB 11.5 (L) 04/01/2024    HGB 12.5 (L) 02/26/2024    Iron Saturation (%)   Date Value Ref Range Status   11/09/2022 36 % Final   05/15/2012 39 20 - 50 % Final      Lab Results   Component Value Date    FERRITIN 169.8 11/09/2022       Anemia labs appropriate, no changes.    Vascular Access:  Vascular Access functioning well - no need for intervention  Blood Flow Rate (mL/min): 400 mL/min    IV Antibiotics to be administered at discharge:  No  This procedure was fully reviewed with the patient and/or their decision-maker. The risks, benefits, and alternatives were discussed prior to the procedure. All questions were answered and written informed consent was obtained.    [name] MARLA Ku, MD  [place] [facility]

## 2024-04-02 NOTE — Unmapped (Signed)
 Daily Progress Note    Subjective:    No acute events overnight.  Reports swelling in his arm has gone down.  Pain has improved from yesterday.  Denies fever or chills.  Denies nausea and vomiting.  He went to dialysis yesterday without issues.    Objective:    Vital signs in last 24 hours:  Temp:  [36.4 ??C (97.6 ??F)-36.9 ??C (98.5 ??F)] 36.6 ??C (97.9 ??F)  Pulse:  [55-91] 73  Resp:  [16-18] 18  BP: (94-172)/(54-105) 128/77  MAP (mmHg):  [93-120] 93  SpO2:  [100 %] 100 %    Intake/Output last 3 shifts:  I/O last 3 completed shifts:  In: 956 [P.O.:956]  Out: 4285 [Drains:285; Other:4000]  Intake/Output this shift:  No intake/output data recorded.    Physical Exam:  -General:  Appropriate, uncomfortable and in no apparent distress.   -Neurological: Moves all 4 extremities spontaneously.   -Cardiovascular: Regular rate and rhythm.  -Pulmonary: Normal work of breathing.   -Abdomen: Soft, non-tender, non-distended. No rebound or guarding.   -Extremities: Warm, well perfused. Positive, strong thrill in RUE. Drain with SS output, 165cc over last day.     Assessment/Plan:    Richard [name] is a 58 y.o. 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  He was admitted after surgery for drain management as well as pain control given he did not receive a block and due to patient preference. I had placed a drain due to a small seroma at the site of previous surgery. It is draining significant volume, so I will plan to leave in at discharge and remove in clinic next week.     He is doing well post-operatively with much better pain control. No pain now and does not feel need for prescription at discharge.  He is NPO for VIR exchange of tunneled line catheter. Possible discharge later today.    Neuro:   - Scheduled Tylenol  (2 g/day given history of liver transplant), Robaxin.  - As needed oxycodone       CV: HDS      Pulm: stable on room air. IS. OOB tid  *Hypoxia  - OOB, Incentive spirometry, and Encourage ambulation     FEN/GI: Regular diet. Medlocked.  *Nausea-Zofran  as needed  *BM: Having regular BM  *Electrolytes: Replete as needed     -History of liver transplant              - Restarted home CellCept  and tacrolimus               - Pharmacy consulted for tacrolimus  trough and dosing     GU: Patient has minimal urine output, he does hemodialysis Tuesday Thursday and Saturday  -Nephrology consulted, scheduled for dialysis     Heme/ID: Afebrile. No leukocytosis.SABRA Post-op Hb 11.5.      Endo: Type 2 diabetes  - Hyperglycemia improved to 204  - Restarted home nutritional and basal insulins  - Endocrine consulted, appreciate recommendations     Disposition: Floor status.     Michelina Esterellas PA-C  04/02/2024  7:47 AM

## 2024-04-02 NOTE — Unmapped (Addendum)
 [facility] INTERVENTIONAL RADIOLOGY - Operative Note     VIR Post-Procedure Note    Procedure Name: exchange of a left internal jugular tunneled HD catheter with fibrin sheath angioplasty.     Pre-Op Diagnosis: ESRD    Post-Op Diagnosis: Same as pre-operative diagnosis    VIR Providers    Attending Physician: Barton LITTIE Tedra Omega, MD    Time out: Prior to the procedure, a time out was performed with all team members present. During the time out, the patient, procedure and procedure site when applicable were verbally verified by the team members and Barton LITTIE Tedra Omega, MD.    Description of procedure: Successful exchange of a left internal jugular tunneled HD catheter with fibrin sheath angioplasty with an 8mm balloon.     Sedation: Single agent Versed .     Estimated Blood Loss: approximately <5 mL  Complications: None    See detailed procedure note with images in PACS [facility]).    The patient tolerated the procedure well without incident or complication and left the room in stable condition.    Barton LITTIE Tedra Omega, MD  04/02/2024 12:27 PM

## 2024-04-02 NOTE — Unmapped (Addendum)
 Pt verbalized understanding of his dc instructions including medications, JP drain care, ( emptying, site care) , dialysis cath, incision care, and follow up appointments. Pt will stop by pharmacy for his Oxycodone . Significant other has his ID. Sterile gauze package, Tegaderm, cups were provided.

## 2024-04-02 NOTE — Unmapped (Signed)
 Shift Summary  Right-sided acute pain was managed effectively with oxyCODONE , resulting in a reduction to zero pain by midday.  ceFAZolin  (ANCEF ) was administered in the Img Vir Hbr and current department as part of procedural care, and no new concerns were noted at catheter or IV sites.  Blood glucose was monitored and correctional insulin  was given, resulting in improved glucose control over the shift.  Safety interventions were maintained, and no falls or adverse events occurred during the shift.  The patient remained stable with no significant changes in infection risk or hospital-acquired complications during the shift.    Absence of Infection Signs and Symptoms: Temperature remained stable throughout the shift and sepsis risk scores were consistently low, with no significant changes in site assessments or dressings to suggest new concerns; ceFAZolin  (ANCEF ) was administered in the Img Vir Hbr and current department as a precaution during procedures.    Optimal Pain Control and Function: Right-sided acute pain decreased steadily from 8 to 0 on the pain scale after administration of oxyCODONE  and non-pharmacologic interventions, with the patient declining further medication and reporting no pain for the remainder of the shift.    Absence of Hospital-Acquired Illness or Injury: No falls or safety events occurred, and all safety interventions such as bed position, call light access, and fall reduction measures were maintained throughout the shift.    Blood Glucose Level Within Targeted Range: Blood glucose levels decreased from 164 mg/dL to 857 mg/dL during the shift, with correctional insulin  administered as indicated by the protocol.

## 2024-04-02 NOTE — Unmapped (Signed)
 HEMODIALYSIS NURSE PROCEDURE NOTE       Treatment Number:  2 Room / Station:  3    Procedure Date:  04/02/24 Device Name/Number: Charlie    Total Dialysis Treatment Time:  111 Min.    CONSENT:    Written consent was obtained prior to the procedure and is detailed in the medical record.  Prior to the start of the procedure, a time out was taken and the identity of the patient was confirmed via name, medical record number and date of birth.     WEIGHT:   Date/Time Pre-Treatment Weight (kg) Estimated Dry Weight (kg) Patient Goal Weight (kg) Total Goal Weight (kg)    04/02/24 1415 123 kg (271 lb 2.7 oz)  118.5 kg (261 lb 3.9 oz)  2 kg (4 lb 6.6 oz)  2-3 kg as tol  2.55 kg (5 lb 10 oz)        Date/Time Post-Treatment Weight (kg) Treatment Weight Change (kg)    04/02/24 1627 120.7 kg (266 lb 1.5 oz)  CAlculated  -2.3 kg      Active Dialysis Orders (168h ago, onward)       Start     Ordered    04/02/24 1416  Hemodialysis inpatient  (Dialysis ONCE)  Once        Question Answer Comment   Patient HD Status: Chronic    New Start? No    K+ Other (please specify)    Ca++ Other (please specify)    Bicarb Other (please specify)    Na+ Other (please specify)    Na+ Modeling no    Dialyzer [id number]    Dialysate Temperature (C) 36.5    BFR-As tolerated to a maximum of: 400 mL/min    DFR 800 mL/min    Duration of treatment 2 Hr    Dry weight (kg) 118.5kg    Challenge dry weight (kg) no    Fluid removal (L) 2-3L gauged by BP and crit line    Tubing Adult = 142 ml    Access Site Dialysis Catheter    Access Site Location Left    Keep SBP >:        04/02/24 1415    04/02/24 1150  Dialysis Schedule Order  (Dialysis ONCE)  Once         04/02/24 1150    04/01/24 1135  Hemodialysis inpatient  Every Tue,Thu,Sat      Question Answer Comment   Patient HD Status: Chronic    New Start? No    K+ 2 meq/L    Ca++ 2.5 meq/L    Bicarb 35 meq/L    Na+ 137 meq/L    Na+ Modeling no    Dialyzer [id number]    Dialysate Temperature (C) 36.5    BFR-As tolerated to a maximum of: 400 mL/min    DFR 800 mL/min    Duration of treatment 4 Hr    Dry weight (kg) 118.5kg    Challenge dry weight (kg) no    Fluid removal (L) to EDW    Tubing Adult = 142 ml    Access Site Dialysis Catheter    Access Site Location Left    Keep SBP >:        04/01/24 1135    03/31/24 1817  Hemodialysis inpatient  Every Tue,Thu,Sat,   Status:  Canceled      Question Answer Comment   Patient HD Status: Chronic    New Start? No    K+  3 meq/L    Ca++ 2.5 meq/L    Bicarb 35 meq/L    Na+ 137 meq/L    Na+ Modeling no    Dialyzer [id number]    Dialysate Temperature (C) 36.5    BFR-As tolerated to a maximum of: 400 mL/min    DFR 800 mL/min    Duration of treatment 4 Hr    Dry weight (kg) 118.5kg    Challenge dry weight (kg) no    Fluid removal (L) to EDW    Tubing Adult = 142 ml    Access Site Dialysis Catheter    Access Site Location Left    Keep SBP >:        03/31/24 1816                  ASSESSMENT:  General appearance: alert  Neurologic: Grossly normal  Lungs: clear to auscultation bilaterally  Heart: regular rate and rhythm, S1, S2 normal, no murmur, click, rub or gallop  Abdomen: soft, non-tender; bowel sounds normal; no masses,  no organomegaly        ACCESS SITE:       Hemodialysis Catheter 04/02/24 Left Internal jugular 2.1 mL 2.1 mL (Active)   Site Assessment Clean;Dry;Intact 04/02/24 1625   Proximal Lumen Status / Patency Gentamicin  Citrate Locked 04/02/24 1625   Proximal Lumen Intervention Deaccessed 04/02/24 1625   Medial Lumen Status / Patency Gentamicin  Citrate Locked 04/02/24 1625   Medial Lumen Intervention Deaccessed 04/02/24 1625   Dressing Intervention No intervention needed 04/02/24 1625   Dressing Status      Intact/not removed 04/02/24 1625   Verification by X-ray Yes 04/02/24 1625   Site Condition Bleeding 04/02/24 1625   Dressing Type CHG gel;Occlusive;Transparent 04/02/24 1625   Dressing Drainage Description Sanguineous 04/02/24 1625   Dressing Change Due 04/04/24 04/02/24 1625   Line Necessity Reviewed? Y 04/02/24 1625   Line Necessity Indications Yes - Hemodialysis 04/02/24 1625           Catheter fill volumes:    Arterial: 2.1 mL Venous: 2.1 mL   Catheter filled with 1 mg Gentamicin  Citrate post procedure.     Patient Lines/Drains/Airways Status       Active Peripheral & Central Intravenous Access       Name Placement date Placement time Site Days    Peripheral IV 03/31/24 Left;Posterior Forearm 03/31/24  0656  Forearm  2    Hemodialysis Catheter 04/02/24 Left Internal jugular 2.1 mL 2.1 mL 04/02/24  1214  Internal jugular  less than 1                   LAB RESULTS:  Lab Results   Component Value Date    NA 137 04/02/2024    K 5.0 (H) 04/02/2024    CL 98 04/02/2024    CO2 22.4 04/02/2024    BUN 27 (H) 04/02/2024    CREATININE 7.48 (H) 04/02/2024    GLU 188 (H) 04/02/2024    GLUF 138 10/06/2014    CALCIUM  7.4 (L) 04/02/2024    CAION 3.67 (L) 02/02/2024    ICAV 4.88 03/04/2012    PHOS 3.7 02/03/2024    MG 2.2 01/26/2024    PTH 267.5 (H) 10/14/2021    IRON 95 11/09/2022    LABIRON 36 11/09/2022    TRANSFERRIN 212.0 (L) 11/09/2022    FERRITIN 169.8 11/09/2022    TIBC 267.1 11/09/2022     Lab Results   Component Value Date  WBC 8.6 04/02/2024    HGB 11.5 (L) 04/02/2024    HCT 34.7 (L) 04/02/2024    PLT 155 04/02/2024    PHART 7.36 03/04/2012    PO2ART 88 03/04/2012    PCO2ART 43 03/04/2012    HCO3ART 23.4 03/04/2012    BEART -1.3 03/04/2012    O2SATART 98.1 03/04/2012    PTINR : 05/06/2012    APTT 290.0 (HH) 01/19/2024        VITAL SIGNS:    Date/Time Temp Temp src       04/02/24 1630 36.3 ??C (97.4 ??F)  Temporal      04/02/24 1428 36.3 ??C (97.3 ??F)  pre hd  --        Date/Time Pulse BP MAP (mmHg) Patient Position    04/02/24 1630 63  129/88  104  Sitting     04/02/24 1624 64  130/90  --  Sitting     04/02/24 1620 49  85/60  --  --     04/02/24 1615 80  103/74  --  --     04/02/24 1600 77  112/81  --  --     04/02/24 1530 73  118/81  --  --     04/02/24 1500 70 134/80  --  --     04/02/24 1445 73  146/103  --  --     04/02/24 1433 70  162/89  --  Sitting     04/02/24 1428 76  147/105  --  --     04/02/24 1258 70  156/93  113  Sitting     04/02/24 12:30:47 68  150/84  --  --     04/02/24 12:25:08 69  143/89  --  --     04/02/24 12:20:39 68  146/85  --  --     04/02/24 12:15:21 72  138/86  --  --     04/02/24 12:10:16 71  147/80  --  --     04/02/24 12:05:06 71  132/84  --  --     04/02/24 1200 69  140/92  --  --     04/02/24 1155 74  131/107  --  --     04/02/24 1150 66  139/84  --  --     04/02/24 11:45:35 68  139/94  --  --       Date/Time Blood Volume Change (%) HCT HGB Critline O2 SAT %    04/02/24 1624 100 %  0.9  0.3  49     04/02/24 1615 -9.7 %  38.1  13  63.2     04/02/24 1600 -9.2 %  37.9  12.9  65.8     04/02/24 1530 -7.7 %  37.3  12.7  62.9     04/02/24 1500 -5 %  36.2  12.3  67.1     04/02/24 1445 -3.2 %  35.6  12.1  63.5     04/02/24 1433 --  34.4  11.7  67.3       Date/Time Resp SpO2 O2 Device O2 Flow Rate (L/min)    04/02/24 1630 17  --  --  --     04/02/24 1624 18  --  --  --     04/02/24 1600 18  --  --  --     04/02/24 1530 18  --  --  --     04/02/24 1500 18  --  --  --     04/02/24  1445 18  --  --  --     04/02/24 1433 18  --  None (Room air)  --     04/02/24 1428 18  --  None (Room air)  --         Date/Time Therapy Number Dialyzer Hemodialysis Line Type All Machine Alarms Passed    04/02/24 1415 2  F-180 (98 mLs)  Adult (142 m/s)  Yes       Date/Time Air Detector Saline Line Double Clampled Hemo-Safe Applied Dialysis Flow (mL/min)    04/02/24 1415 Engaged  --  --  800 mL/min       Date/Time Verify Priming Solution Priming Volume Hemodialysis Independent pH Hemodialysis Machine Conductivity (mS/cm)    04/02/24 1415 0.9% NS  300 mL  --  13.8 mS/cm       Date/Time Hemodialysis Independent Conductivity (mS/cm) Bicarb Conductivity Residual Bleach Negative Total Chlorine    04/02/24 1415 13.8 mS/cm  -- Yes  0       Date/Time Pre-Hemodialysis Comments 04/02/24 1415 alert in [facility]       Date/Time Blood Flow Rate (mL/min) Arterial Pressure (mmHg) Venous Pressure (mmHg) Transmembrane Pressure (mmHg)    04/02/24 1624 170 mL/min  -1 mmHg  36 mmHg  6 mmHg     04/02/24 1615 300 mL/min  -268 mmHg  165 mmHg  20 mmHg     04/02/24 1600 300 mL/min  -166 mmHg  164 mmHg  19 mmHg     04/02/24 1530 400 mL/min  -239 mmHg  195 mmHg  6 mmHg     04/02/24 1500 400 mL/min  -233 mmHg  190 mmHg  5 mmHg     04/02/24 1445 400 mL/min  -218 mmHg  179 mmHg  4 mmHg     04/02/24 1433 400 mL/min  -210 mmHg  190 mmHg  0 mmHg       Date/Time Ultrafiltration Rate (mL/hr) Ultrafiltrate Removed (mL) Dialysate Flow Rate (mL/min) KECN (Kecn)    04/02/24 1624 0 mL/hr  2883 mL  0 ml/min  --     04/02/24 1615 1000 mL/hr  2866 mL  0 ml/min  --     04/02/24 1600 1830 mL/hr  2525 mL  0 ml/min  --     04/02/24 1530 1820 mL/hr  1443 mL  0 ml/min  --     04/02/24 1500 1820 mL/hr  810 mL  0 ml/min  --     04/02/24 1445 1530 mL/hr  168 mL  0 ml/min  --     04/02/24 1433 1530 mL/hr  0 mL  0 ml/min  seq  --       Date/Time Intra-Hemodialysis Comments    04/02/24 1624 DUF Tx aborted d/t systematic hypotension     04/02/24 1620 UF off     04/02/24 1615 pt feeling a little off, reduced Fluid removal goal a bit     04/02/24 1600 asleep     04/02/24 1530 DR Mottl rounding     04/02/24 1500 increased goal, alert     04/02/24 1445 DR Mottl rounding     04/02/24 1433 started per protocol       Date/Time Rinseback Volume (mL) On Line Clearance: spKt/V Total Liters Processed (L/min) Dialyzer Clearance    04/02/24 1627 300 mL  0 spKt/V  DUF  0 L/min  DUF  Moderately streaked       Date/Time Post-Hemodialysis Comments    04/02/24 1627 VSS upon exit  Date/Time Total Hemodialysis Replacement Volume (mL) Total Ultrafiltrate Output (mL)    04/02/24 1627 --  2333 mL        3H05-3H05-01 - Medicaitons Given During Treatment  (last 3 hrs)           BROWN, JASON T, RN         Medication Name Action Time Action Route Rate Dose User gentamicin -sodium citrate  lock solution in NS 04/02/24 1624 Given Intra-cannular  2.1 mL Delores Selinda DASEN, RN     gentamicin -sodium citrate  lock solution in NS 04/02/24 1624 Given Intra-cannular  2.1 mL Delores Selinda DASEN, RN            GALANG, ROWENA G, RN         Medication Name Action Time Action Route Rate Dose User     insulin  lispro (HumaLOG ) injection 40 Units 04/02/24 1406 Given Subcutaneous  10 Units Galang, Rowena G, RN            Hart Haas H, RN         Medication Name Action Time Action Route Rate Dose User     heparin  (porcine) 1000 unit/mL injection 1,000 Units 04/02/24 1438 Given Dialysis Circuit  1,000 Units Luella Mliss DEL, RN                      Patient tolerated treatment in a  Dialysis Recliner.

## 2024-04-02 NOTE — Unmapped (Signed)
 Hemodialysis DUF treatment 2.3 liters re,oved , BP dipped last 20 minutes of treatment and rinsed back early.  VSS post tx  Problem: Hemodialysis  Goal: Safe, Effective Therapy Delivery  Outcome: Ongoing - Unchanged  Goal: Effective Tissue Perfusion  Outcome: Ongoing - Unchanged  Goal: Absence of Infection Signs and Symptoms  Outcome: Ongoing - Unchanged

## 2024-04-02 NOTE — Unmapped (Signed)
 Endocrine Team Diabetes Follow Up Consult Note     Consult information:  Requesting Attending Physician : Oliva Sprinkles, MD  Service Requesting Consult : General Surgery  Primary Care Provider: Jama Mayo, MD  Impression:  Richard [name] is a 58 y.o. male admitted for pain and drainage from new brachiobasilic arteriovenous fistula on 02/20/24 as part of a two stage transposition. Now s/p revision and drainage 10/6. We have been consulted at the request of Oliva Sprinkles, MD to evaluate [place] for hyperglycemia.     Medical Decision Making:  Diagnoses:  1.Type 2 Diabetes. Uncontrolled With severe hyperglycemia last 24 hours.  2. Nutrition: Complicating glycemic control. Increasing risk for both hypoglycemia and hyperglycemia.  3. End Stage Renal Disease. Complicating glycemic control and increasing risk for hypoglycemia.  4. Liver Transplant. Complicating glycemic control and increasing risk for hyperglycemia.  5. Heart Failure. Complicating glycemic control and increasing risk for hyperglycemia.  6. Steroids. Complicating glycemic control and increasing risk for hyperglycemia.      Studies reviewed 04/02/24:  Labs: CBC, CMP, and POCT-BG  Interpretation: WBCs normal. Anemia stable. Normal sodium. Hyperkalemia improved. Elevated Cr with reduced GFR in setting of known ESRD. Hyperglycemia, some severe, now improving.   Notes reviewed: Primary team and nursing notes      Overall impression based on above reviews and history:  Patient with poorly controlled T2DM vs post transplant diabetes c/b ESRD, now admitted for pain and drainage from new brachiobasilic arteriovenous fistula on 02/20/24. Noted to be severely hyperglycemic in the setting of dexamethasone  4 mg given in OR on 10/6. Nutritional insulin  and correction scale increased yesterday. Blood sugars improved, now trending within goal range. Expect steroid effects to wear off today. Per discussion with patient, will continue with the current insulin  regimen as listed below. Insulin  recs reviewed with team and RN.     Recommendations:  -  Lantus  60 units at bedtime  -  Lispro 40 units qAC   - patient to request reduced dose if preferred based on meal size and pre-meal BG  - Lispro correction 1:30>140 5x/day  - Hypoglycemia protocol.  - POCT-BG 5 times a day.  - Ensure patient is on glucose precautions if patient taking nutrition by mouth.     Discharge planning:  Will plan to discharge on home diabetes regimen. He is scheduled for outpatient endo follow-up in our [facility] clinic with Dr. Osie on October 17th.    Thank you for this consult. Discussed plan with primary team. We will continue to follow and make recommendations and place orders as appropriate.    Please page with questions or concerns: Consuelo Marsa Bellman, PA: [phone]  Endocrinology Diabetes Care Team on call from 6AM - 3PM on weekdays      Subjective:  Interval History:  Patient seen s/p VIR procedure this AM. He reports feeling well at time of rounds. Pain controlled. Denies N/V. Plan for iHD this afternoon and possible dispo home tomorrow, 10/9.     Initial HPI:  Richard [name] is a 58 y.o. 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 Now s/p revision and drainage.     Diabetes History:  Patient has a history of Type 2 diabetes diagnosed around 8 years ago after liver transplant.  Diabetes is managed by:  PCP. Patient requesting endocrinology follow-up outpatient.   Current home diabetes regimen: Trizepatide 15 mg weekly (last taken 3+ weeks ago for surgery), Tresiba  U-200 55-60 units every day, novolog  10-55 units qAC based on pre-meal BG. Reports blood sugars are well controlled typically in the 100 to low 200s while on Trizepatide.   Current home blood glucose monitoring: Dexcom G7.  Hypoglycemia awareness: Yes.  Complications related to diabetes: peripheral neuropathy and ESRD on dialysis      Current Nutrition:  Active Orders   Diet    NPO Sips with meds; Procedure/Test       ROS: As per HPI.    Scheduled Medications[1]    Current Outpatient Medications   Medication Instructions    blood-glucose meter,continuous (DEXCOM G6 RECEIVER) Misc 1 each, Miscellaneous, Daily, Dispense 1 receiver annually.  Sent to [facility]    blood-glucose sensor (DEXCOM G7 SENSOR) Devi Use to continuously monitor blood glucose.  Change sensor every 10 days.    blood-glucose transmitter (DEXCOM G6 TRANSMITTER) Devi 1 each, Miscellaneous, Every 3 months, [facility] pharmacy    blood-glucose transmitter (DEXCOM G6 TRANSMITTER) Devi Use as directed to monitor blood glucose    CHOLECALCIFEROL, VITAMIN D3, (VITAMIN D3 ORAL) 2,000 Units, Daily (standard)    ELIQUIS  5 mg, Oral, 2 times a day (standard)    gabapentin  (NEURONTIN ) 300 mg, Oral, Nightly    hydrOXYzine  (ATARAX ) 25 mg, Oral, Every 6 hours PRN    MOUNJARO  15 mg, Subcutaneous, Every 7 days    multivitamin (TAB-A-VITE/THERAGRAN) per tablet 1 tablet, Daily (standard)    mycophenolate  (CELLCEPT ) 250 mg, Oral, 2 times a day (standard)    NovoLOG  Flexpen U-100 Insulin  35 Units, Subcutaneous, 3 times a day (AC)    oxyCODONE  (ROXICODONE ) 5 mg, Oral, Every 6 hours PRN    pen needle, diabetic (TRUEPLUS PEN NEEDLE) 32 gauge x 5/32 (4 mm) Ndle Use with insulin  up to 4 times a day as needed.    pen needle, diabetic 32 gauge x 5/32 (4 mm) Ndle Use with insulin  up to 4 times/day as needed.    PROGRAF  1 mg, Oral, 2 times a day    rifAXIMin  (XIFAXAN ) 550 mg, Oral, 2 times a day (standard)    sevelamer  (RENAGEL ) 1,600 mg, 3 times a day (with meals)    TRESIBA  FLEXTOUCH U-200 60 Units, Subcutaneous, Daily (standard), Max dose of 100 units per day.    ursodiol  (ACTIGALL ) 600 mg, Oral, 2 times a day (standard)           Past Medical History[2]    Past Surgical History[3]    Family History[4]    Short Social History[5]    OBJECTIVE:  BP 157/94  - Pulse 85  - Temp 36.4 ??C (97.6 ??F)  - Resp 18  - Ht 177.8 cm (5' 10)  - Wt (!) 123.9 kg (273 lb 2.4 oz)  - SpO2 100%  - BMI 39.19 kg/m??   Wt Readings from Last 12 Encounters:   03/31/24 (!) 123.9 kg (273 lb 2.4 oz)   03/11/24 (!) 118.1 kg (260 lb 4.8 oz)   02/26/24 (!) 118.8 kg (262 lb)   02/05/24 (!) 119.8 kg (264 lb 1.6 oz)   01/26/24 (!) 123.2 kg (271 lb 9.7 oz)   01/24/24 (!) 122.5 kg (270 lb)   01/17/24 (!) 122.5 kg (270 lb)   01/14/24 (!) 122.8 kg (270 lb 12.8 oz)   01/11/24 (!) 117.9 kg (260 lb)   12/24/23 (!) 120.3 kg (265 lb 4.8 oz)  11/09/23 (!) 127 kg (280 lb)   10/01/23 (!) 123.4 kg (272 lb)     Physical Exam  Vitals and nursing note reviewed.   Constitutional:       General: He is not in acute distress.     Appearance: He is obese. He is ill-appearing (chronically).      Comments: Sitting up in bed   HENT:      Head: Normocephalic.      Mouth/Throat:      Mouth: Mucous membranes are moist.   Eyes:      Extraocular Movements: Extraocular movements intact.      Conjunctiva/sclera: Conjunctivae normal.   Pulmonary:      Effort: Pulmonary effort is normal. No respiratory distress.   Skin:     General: Skin is warm and dry.   Neurological:      Mental Status: He is alert and oriented to person, place, and time.   Psychiatric:         Behavior: Behavior normal.         Thought Content: Thought content normal.           BG/insulin  reviewed per EMR.   Glucose, POC   Date Value   04/02/2024 204 mg/dL (H)   89/91/7974 688 mg/dL (H)   89/92/7974 554 mg/dL (HH)   89/92/7974 588 mg/dL (HH)   89/92/7974 777 mg/dL (H)   89/92/7974 642 mg/dL (H)   89/92/7974 605 mg/dL (H)   89/92/7974 645 mg/dL (H)   92/78/7974 752 mg/dL (A)   97/74/7984 893 mg/dL   97/74/7984 872 mg/dL   90/78/7986 878 MG/DL   90/78/7986 97 MG/DL   90/79/7986 883 MG/DL   90/79/7986 874 MG/DL   90/79/7986 883 MG/DL        Summary of labs:  Lab Results Component Value Date    A1C 10.0 (A) 01/14/2024    A1C 11.8 08/28/2023    A1C 10.5 03/01/2023     Lab Results   Component Value Date    GFR 44.92 (L) 09/03/2012    CREATININE 10.15 (H) 04/01/2024     Lab Results   Component Value Date    WBC 8.7 04/01/2024    HGB 11.5 (L) 04/01/2024    HCT 34.1 (L) 04/01/2024    PLT 194 04/01/2024       Lab Results   Component Value Date    NA 134 (L) 04/01/2024    K 6.0 (H) 04/01/2024    CL 95 (L) 04/01/2024    CO2 21.1 04/01/2024    BUN 56 (H) 04/01/2024    CREATININE 10.15 (H) 04/01/2024    GLU 376 (H) 04/01/2024    CALCIUM  7.3 (L) 04/01/2024    MG 2.2 01/26/2024    PHOS 3.7 02/03/2024       Lab Results   Component Value Date    BILITOT 0.4 04/01/2024    BILIDIR 0.30 02/03/2024    PROT 6.5 04/01/2024    ALBUMIN 2.9 (L) 04/01/2024    ALT <7 (L) 04/01/2024    AST 15 04/01/2024    ALKPHOS 132 (H) 04/01/2024    GGT 123 (H) 01/30/2024                      [1]    acetaminophen   500 mg Oral Q6H    gabapentin   300 mg Oral Nightly    heparin  (porcine) for subcutaneous use  5,000 Units Subcutaneous Q8H SCH    insulin  glargine  60 Units Subcutaneous Nightly    insulin  lispro  40 Units Subcutaneous TID AC    insulin  lispro  0-20 Units Subcutaneous 5XD insulin     insulin  NPH  10 Units Subcutaneous Once    methocarbamol  500 mg Oral QID    mycophenolate   250 mg Oral BID    tacrolimus   1 mg Oral BID   [2]   Past Medical History:  Diagnosis Date    CHF (congestive heart failure) (CMS-HCC)     COVID-19 07/18/2019    Diabetes mellitus (CMS-HCC)     Family history of malignant neoplasm of prostate 06/23/2015    Heart disease     Heart murmur     Hepatic cirrhosis    (CMS-HCC) 06/23/2015    Overview:  Secondary to NASH; followed by Dr. Lindaann, GI.  Last Assessment & Plan:  Relevant Hx: Course: Daily Update: Today's Plan:     Hypertension     Hypogonadism in male 02/12/2014    Kidney stone     Liver cirrhosis secondary to NASH (nonalcoholic steatohepatitis) (CMS-HCC)     s/p liver transplant 2013 Obesity    [3]   Past Surgical History:  Procedure Laterality Date    CHOLECYSTECTOMY      liver transpant      LIVER TRANSPLANTATION  06/27/2011    PR ANASTOMOSIS,AV,ANY SITE Right 02/20/2024    Procedure: ARTERIOVENOUS ANASTOMOSIS, OPEN; DIRECT, ANY SITE, UPPER EXTREMITY;  Surgeon: Marchelle Kitchens, MD;  Location: [facility];  Service: General Surgery    PR AV ANAST,UP ARM BASILIC VEIN TRANSPOSIT Right 03/31/2024    Procedure: ARTERIOVENOUS ANASTOMOSIS, OPEN; BY UPPER ARM BASILIC VEIN TRANSPOSITION;  Surgeon: Marchelle Kitchens, MD;  Location: [facility] OR [facility];  Service: General Surgery    PR AV FIST REVISE GRFT,W THROMBECTOMY Right 01/15/2023    Procedure: REVISION, ARTERIOVENOUS FISTULA W/ THROMBECTOMY, AUTOGENOUS OR NONAUTOGENOUS DIALYSIS GRAFT (SEP. PROC), LOWER EXTREMITY;  Surgeon: Marchelle Kitchens, MD;  Location[facility];  Service: General Surgery    PR COLONOSCOPY FLX DX W/COLLJ SPEC WHEN PFRMD N/A 01/24/2024    Procedure: COLONOSCOPY, FLEXIBLE, PROXIMAL TO SPLENIC FLEXURE; DIAGNOSTIC, W/WO COLLECTION SPECIMEN BY BRUSH OR WASH;  Surgeon: Erick Prentice HERO, MD;  [facility];  Service: Gastroenterology    PR COLONOSCOPY W/BIOPSY SINGLE/MULTIPLE N/A 08/13/2018    Procedure: COLONOSCOPY, FLEXIBLE, PROXIMAL TO SPLENIC FLEXURE; WITH BIOPSY, SINGLE OR MULTIPLE;  Surgeon: Thedora Alm Plain, MD;  [facility];  Service: Gastroenterology    PR COLSC FLX W/RMVL OF TUMOR POLYP LESION SNARE TQ N/A 08/13/2018    Procedure: COLONOSCOPY FLEX; W/REMOV TUMOR/LES BY SNARE;  Surgeon: Thedora Alm Plain, MD;  [facility]: [facility];  Service: Gastroenterology    PR CREAT AV FISTULA,NON-AUTOGENOUS GRAFT Right 11/29/2022    Procedure: CREATE AV FISTULA (SEPARATE PROC); NONAUTOGENOUS GRAFT (EG, BIOLOGICAL COLLAGEN, THERMOPLASTIC GRAFT), UPPER EXTREMITY;  Surgeon: Marchelle Kitchens, MD;  Location: [facility] OR [facility];  Service: General Surgery    PR UPPER GI ENDOSCOPY,BIOPSY N/A 07/13/2015    Procedure: UGI ENDOSCOPY; WITH BIOPSY, SINGLE OR MULTIPLE;  Surgeon: Elspeth Jerilynn Reek, MD;  [facility]: [facility];  Service: Gastroenterology    PR UPPER GI ENDOSCOPY,BIOPSY N/A 02/13/2023    Procedure: UGI ENDOSCOPY; WITH BIOPSY, SINGLE OR MULTIPLE;  Surgeon: Minnie Krystal Claude, MD;  [facility]: [facility];  Service: Gastroenterology    PR UPPER GI ENDOSCOPY,DIAGNOSIS N/A 01/24/2024    Procedure: UGI ENDO, INCLUDE ESOPHAGUS,  STOMACH, & DUODENUM &/OR JEJUNUM; DX W/WO COLLECTION SPECIMN, BY BRUSH OR WASH;  Surgeon: Erick Prentice HERO, MD;  [facility]: [facility];  Service: Gastroenterology   [4]   Family History  Problem Relation Age of Onset    Diabetes Father     Diabetes Paternal Grandfather     Liver disease Maternal Uncle     Cancer Maternal Grandmother     Melanoma Neg Hx     Basal cell carcinoma Neg Hx     Squamous cell carcinoma Neg Hx     Kidney disease Neg Hx    [5]   Social History  Tobacco Use    Smoking status: Never     Passive exposure: Past    Smokeless tobacco: Never   Vaping Use    Vaping status: Never Used   Substance Use Topics    Alcohol use: No    Drug use: No

## 2024-04-02 NOTE — Unmapped (Signed)
 Discharge Summary    Admit date: 03/31/2024    Discharge date and time: 04/02/24 4:30 pm    Discharge to:  Home    Discharge Service: General Surgery    Discharge Attending Physician: Oliva Sprinkles, MD    Discharge  Diagnoses: ESRD on hemodialysis. Catheter dependent.     Secondary Diagnosis: Principal Problem:    Arteriovenous fistula (POA: Unknown)  Resolved Problems:    * No resolved hospital problems. *      OR Procedures:    Right - ARTERIOVENOUS ANASTOMOSIS, OPEN; BY UPPER ARM BASILIC VEIN TRANSPOSITION  Date  03/31/2024  -------------------     Ancillary Procedures: no procedures    Discharge Day Services: None    Subjective   No acute events overnight. Reports swelling in his arm has gone down. Pain has improved from yesterday. Denies fever or chills. Denies nausea and vomiting. He went to dialysis yesterday without issues.     Objective   Patient Vitals for the past 8 hrs:   BP Temp Temp src Pulse Resp SpO2   04/02/24 1630 129/88 36.3 ??C (97.4 ??F) Temporal 63 17 --   04/02/24 1624 130/90 -- -- 64 -- --   04/02/24 1615 103/74 -- -- 80 -- --   04/02/24 1600 112/81 -- -- 77 18 --   04/02/24 1530 118/81 -- -- 73 18 --   04/02/24 1500 134/80 -- -- 70 18 --   04/02/24 1445 146/103 -- -- 73 18 --   04/02/24 1433 162/89 -- -- 70 18 --   04/02/24 1428 147/105 36.3 ??C (97.3 ??F) -- 76 18 --   04/02/24 1258 156/93 -- -- 70 20 97 %   04/02/24 1230 150/84 -- -- 68 12 97 %   04/02/24 1225 143/89 -- -- 69 19 93 %   04/02/24 1220 146/85 -- -- 68 15 99 %   04/02/24 1215 138/86 -- -- 72 15 98 %   04/02/24 1210 147/80 -- -- 71 17 97 %   04/02/24 1205 132/84 -- -- 71 15 93 %   04/02/24 1200 140/92 -- -- 69 18 99 %   04/02/24 1155 131/107 -- -- 74 21 99 %   04/02/24 1150 139/84 -- -- 66 15 99 %   04/02/24 1145 139/94 -- -- 68 18 95 %   04/02/24 1141 142/88 -- -- 63 18 98 %     I/O this shift:  In: 240 [P.O.:240]  Out: 2353 [Drains:20; Other:2333]    -General:  Appropriate, uncomfortable and in no apparent distress.   -Neurological: Moves all 4 extremities spontaneously.   -Cardiovascular: Regular rate and rhythm.  -Pulmonary: Normal work of breathing.   -Abdomen: Soft, non-tender, non-distended. No rebound or guarding.   -Extremities: Warm, well perfused. Positive, strong thrill in RUE. Drain with SS output, 165cc over last day.     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  He underwent revision of arteriovenous fistula right arm with basilic vein transposition with drainage of postoperative seroma with 10 round fluted Blake drain was placed on 03/31/2024 with Dr. Sprinkles.    He was admitted afterwards for pain control and drain care. He did not receive a pre-operative block due to patient preference.    On hospital day #  2, endocrinology was consulted for hyperglycemia and poorly controlled diabetes. Home nutritional and basal insulins were restarted.  Nephrology was consulted. Dialysis was performed via right IJ tunneled catheter.  There was some difficulty running.  VIR was consulted for evaluation of line malfunction.     On hospital day #3, he underwent exchange of a left internal jugular tunneled HD catheter with fibrin sheath angioplasty with VIR.  He underwent dialysis today in the [facility] was functioning well.    Recommend follow-up in clinic in 1 week for drain removal.    Condition at Discharge: Improved  Discharge Medications:      Medication List      CHANGE how you take these medications     NovoLOG  Flexpen U-100 Insulin  100 unit/mL (3 mL) injection pen; Generic   drug: insulin  aspart; Inject 0.35 mL (35 Units total) under the skin Three   (3) times a day before meals.; What changed: how much to take     CONTINUE taking these medications     DEXCOM G6 RECEIVER Misc; Generic drug: blood-glucose,receiver,cont; 1   each by Miscellaneous route in the morning. Dispense 1 receiver annually.    Sent to [facility].   * DEXCOM G6 TRANSMITTER Devi; Generic drug: blood-glucose transmitter; 1   each by Miscellaneous route Every three (3) months. [facility]   * DEXCOM G6 TRANSMITTER Devi; Generic drug: blood-glucose transmitter;   Use as directed to monitor blood glucose   DEXCOM G7 SENSOR Devi; Generic drug: blood-glucose sensor; Use to   continuously monitor blood glucose.  Change sensor every 10 days.   ELIQUIS  5 mg Tab; Generic drug: apixaban ; Take 1 tablet (5 mg total) by   mouth two (2) times a day.   famotidine  10 MG tablet; Commonly known as: PEPCID    gabapentin  300 MG capsule; Commonly known as: NEURONTIN ; Take 1 capsule   (300 mg total) by mouth nightly.   hydrOXYzine  25 MG tablet; Commonly known as: ATARAX ; Take 1 tablet (25   mg total) by mouth every six (6) hours as needed for anxiety, itching or   allergies.   MOUNJARO  15 mg/0.5 mL Pnij; Generic drug: tirzepatide ; Inject 15 mg   under the skin every seven (7) days.   multivitamin per tablet; Commonly known as: TAB-A-VITE/THERAGRAN   mycophenolate  250 mg capsule; Commonly known as: CELLCEPT ; Take 1   capsule (250 mg total) by mouth two (2) times a day.   oxyCODONE  5 MG immediate release tablet; Commonly known as: ROXICODONE ;   Take 1 tablet (5 mg total) by mouth every six (6) hours as needed for pain   for up to 5 days.   pantoprazole  40 MG tablet; Commonly known as: Protonix    PROGRAF  0.5 mg capsule; Generic drug: tacrolimus ; Take 2 capsules (1 mg   total) by mouth two (2) times a day.   rifAXIMin  550 mg Tab; Commonly known as: XIFAXAN ; Take 1 tablet (550 mg   total) by mouth two (2) times a day.   sevelamer  800 MG tablet; Commonly known as: RENAGEL    TRESIBA  FLEXTOUCH U-200 200 unit/mL (3 mL) Inpn; Generic drug: insulin    degludec; Inject 0.3 mL (60 Units total) under the skin daily. Max dose of   100 units per day.   * TRUEPLUS PEN NEEDLE 32 gauge x 5/32 (4 mm) Ndle; Generic drug: pen   needle, diabetic; Use with insulin  up to 4 times/day as needed.   * TRUEPLUS PEN NEEDLE 32 gauge x 5/32 (4 mm) Ndle; Generic  drug: pen   needle, diabetic; Use with insulin  up to 4 times a day as needed.   ursodiol  300 mg capsule; Commonly known as: ACTIGALL ; Take 2 capsules   (600 mg total) by mouth two (2) times a day.   VITAMIN D3 ORAL  * This list has 4 medication(s) that are the same as other medications   prescribed for you. Read the directions carefully, and ask your doctor or   other care provider to review them with you.       Pending Test Results:     Discharge Instructions:  Activity:   Activity Instructions       Lifting restrictions (specify)      Please avoid lifting over 15lbs, pushing, or pulling over 15lbs, or overhead activity until we can see you in follow up.    Lifting restrictions (specify)      Weight restriction of 15 lbs.    OK to shower (no bath)     Additional Activity Instructions:    Instructions after Anesthesia:    Do not drink alcoholic beverages, sign legal documents, or operate heavy machinery for the next 24 hours or while taking narcotic pain medications.  You may feel light headed and/or dizzy. Get up slowly and rest for the next 24 hours even if you are feeling well.    If you experience the following please refer to the number provided by the surgeon or contact the doctor on call at [phone]:    If you are unable to urinate when your bladder feels full or if you have not urinated in 8 hours.  If you are unable to tolerate fluids within 8 hours of discharge.           Diet:  Diet Instructions       Discharge diet (specify)      Discharge Nutrition Therapy: Renal    Recommend bland diet for the first 24 hours, advance as tolerated.        Discharge diet (specify)      Discharge Nutrition Therapy: Regular      Nausea:   Occasionally patients experience nausea (upset stomach) following surgery which may be due to anesthesia or the pain medication you are taking.  Eating small meals and drinking lots of fluids can help decrease nausea.  It is important NOT to take your pain medication on an empty stomach as this can make your nausea worse.   If you experience persistent nausea or vomiting please contact your surgeon's office or the physician on call after hours.     Diet:   Resume your normal diet.   Gradually drink 8-10 glasses of non-caffeinated liquid per day.            Other Instructions:  Other Instructions       Call MD for:  difficulty breathing, headache or visual disturbances      Call MD for:  persistent nausea or vomiting      Call MD for:  persistent nausea or vomiting      Call MD for:  redness, tenderness, or signs of infection (pain, swelling, redness, odor or green/yellow discharge around incision site)      Call MD for:  redness, tenderness, or signs of infection (pain, swelling, redness, odor or green/yellow discharge around incision site)      Call MD for:  severe uncontrolled pain      Call MD for:  severe uncontrolled pain      Call MD  for: Temperature > 38.5 Celsius ( > 101.3 Fahrenheit)      Call MD for: Temperature > 38.5 Celsius ( > 101.3 Fahrenheit)      Discharge instructions      ARTERIOVENOUS FISTULA (AV FISTULA) TRANSPOSITION     You had a procedure called an arteriovenous fistula transposition in your arm. This was done through one incision which was closed with dissolvable stitches. It is covered with multiple adhesive strips, and wrapped with a bulky dressing. You can remove the bulky wrap in 24 hours, however do not remove the adhesive strips underneath.   You may shower with the adhesive strips in 48 hours, and it is ok if the arm get a little wet from the spray of the shower. However, PLEASE AVOID submersing your fistula arm in water, so do NOT take a bath, swim in a pool, wash dishes, etc. while this is healing.      You have been given Oxycodone  for pain. Please do not take this medication and work, or drive as it can make you drowsy.  The Oxycodone  can make you constipated, especially after surgery.  You can use Colace as directed if needed for constipation.     You may have swelling at this fistula arm for the first 2-3 weeks.      Please avoid lifting over 15lbs, pushing, or pulling over 15lbs, or overhead activity until we can see you in follow up.    * Do not place any pressure such as belts, braces, or tight fabric around your fistula arm. Avoid wearing rings on your fistula arm at this time. Do not keep your fistula arm bent for any extended period of time.  * Do not allow your blood pressure to be checked at this fistula arm as well.  * Please require all blood work to be taken from your non-fistula arm at this time.  * Do not pick any scabs on your fistula arm. This may cause infection.  * Avoid sleeping on your fistula arm.     You must know how to look after your fistula at home.  You should check your fistula daily to make sure that it is working.  This is done by placing your fingers on the skin over your fistula. You should feel a vibration ('thrill').     Exercising your arm in which the fistula is created can help it to develop more quickly. You may do this by taking a spongy ball in your hand and squeeze the ball repeatedly for five to 10 minutes. Repeat this two to three times a day. If your arm becomes tired or painful during the exercises, stop and rest it.     We would like to see you in our clinic in about 2 weeks.  Please schedule your follow-up appointment by calling the phone numbers below if you have not heard of your general surgery follow up appointment:    Scheduling: [phone]  RN / Clinic: [phone] Ext. [phone]    Please contact the general surgery clinic office at [phone] if:    * Your incision becomes red, warm, or drains pus  * You develop a temperature higher than 101F  * Your bandage is soaked with blood; it is normal to experience some bleeding after surgery.  * The 'thrill' of the fistula goes away.  * Your fingers become numb, painful, and/or blue.     If you have a non-emergency regarding your surgery, please call our clinic RN at  [phone] ext. [phone].   If you have an emergency during regular business hours regarding your surgery, please go to your nearest Emergency Department AND you may call our clinic RN at [phone] ext. [phone] OR call the hospital operator at [phone] and ask for the [facility] General Surgeon On-Call: Attending Consultant. The On-Call Surgeon will be paged and will call you back. If you have an emergency outside of regular business hours regarding your surgery, please go to your nearest Emergency Department.    Discharge instructions      ARTERIOVENOUS FISTULA (AV FISTULA) TRANSPOSITION     You may restart your Eliquis , and take as directed.     Please continue your insulin  as previously directed, and follow up with Endocrine later this month as scheduled.  Someone should reach out to you to schedule an appt.     You had a procedure called an arteriovenous fistula transposition in your arm. This was done through one incision which was closed with dissolvable stitches. It is covered with multiple adhesive strips, and wrapped with a bulky dressing. You can leave the dressing on until we see you in the clinic.  Please keep the arm/dressing clean, and dry.  Do not submerge in water.      You have been given Oxycodone  for pain. Please do not take this medication and work, or drive as it can make you drowsy.  The Oxycodone  can make you constipated, especially after surgery.  You can use Colace as directed if needed for constipation.     You may have swelling at this fistula arm for the first 2-3 weeks.      Please avoid lifting over 15lbs, pushing, or pulling over 15lbs, or overhead activity until we can see you in follow up.    Do not place any pressure such as belts, braces, or tight fabric around your fistula arm. Avoid wearing rings on your fistula arm at this time. Do not keep your fistula arm bent for any extended period of time.    Do not allow your blood pressure to be checked at this fistula arm as well.    Please require all blood work to be taken from your non-fistula arm at this time.    Do not pick any scabs on your fistula arm. This may cause infection.    Avoid sleeping on your fistula arm.       You must know how to look after your fistula at home.  You should check your fistula daily to make sure that it is working.  This is done by placing your fingers on the skin over your fistula. You should feel a vibration ('thrill').       Exercising your arm in which the fistula is created can help it to develop more quickly. You may do this by taking a spongy ball in your hand and squeeze the ball repeatedly for five to 10 minutes. Repeat this two to three times a day. If your arm becomes tired or painful during the exercises, stop and rest it.    You have a follow up appt with Dr Marchelle in the surgery clinic on 10/14 at 9:30 AM.     Scheduling: [id number]    RN / Clinic: [phone]      Please contact the general surgery clinic office at [phone] if:    -Your incision becomes red, warm, or drains pus    -You develop a temperature higher than 101F    -Your bandage  is soaked with blood; it is normal to experience some bleeding after surgery.    -The 'thrill' of the fistula goes away.    -Your fingers become numb, painful, and/or blue.       If you have a non-emergency regarding your surgery, please call our clinic RN at [phone]. If you have an emergency during regular business hours regarding your surgery, please go to your nearest Emergency Department AND you may call our clinic RN at [phone] OR call the hospital operator at [phone] and ask for the [facility] General Surgeon On-Call: Attending Consultant. The On-Call Surgeon will be paged and will call you back. If you have an emergency outside of regular business hours regarding your surgery, please go to your nearest Emergency Department.          CARING FOR YOUR JP Drain    Patient Information    WHAT IS A JACKSON-PRATT (JP) DRAIN?   A bulb like drain with tubing placed inside your body to help the extra fluid around your surgery site(s) drain down into the bulb   The drain is stitched in place to the skin and connected to a bulb outside of your body   The bulb will fill with fluid from your surgical site(s)     WHY DO I NEED A JP DRAIN?   The drain helps with healing after surgery by removing extra fluid from the surgery site(s)   The removal of the extra fluid will help the surgical site(s) to heal     CARING FOR YOUR JP DRAIN   1. Empty drain EVERY morning, EVERY night, AND if bulb is halfway full   2. Gather your supplies:   3. Measuring cup   4. Mild soap ex: Dove??   5. Wash your hands with soap and warm water for at least 20 seconds every time you care for the drain!   6. Remove stopper plug from on top of bulb to empty   7. Do not touch drain port   8. Turn bulb upside down and squeeze fluid out into measuring cup   9. Measure drain fluid output into log   10. Discard drain fluid into toilet EMPTYING YOUR JP DRAIN STOPPER PLUG   11.After fluid is drained completely, wipe the drain opening with an alcohol swab   12.Rinse measuring cup   13.Prepare bulb for suction or GRAVITY    PLACING DRAIN TO SUCTION OR GRAVITY After emptying bulb   1. Squeeze the bulb with your hand to remove most of the air   2. Place stopper plug into drain opening while squeezing the bulb   3. Fluid will be gently pulled into bulb     CLEANING YOUR DRAIN SITE   1. Clean and check site DAILY   2. Gather your supplies: Mild soap ex: Dove?? Tap water   3. Wash your hands with soap and warm water for at least 20 seconds every time you care for the drain!   4. If there is a dressing, remove old dressing and dispose in regular trash   5. Clean site with a sterile gauze (2x2,4x4) or cotton swab DO NOT SCRUB   6. Begin cleaning from where drain exits the body   7. ONLY clean way from drain insertion site   8. Throw gauze way after ONE motion outward   9. Use NEW gauze to repeat the cleaning process   10.Check skin around drain for redness, tenderness, swelling, or drainage     *  Bulb can flatten this is normal Hold tube close to body below the suture site Use other hand to slide punched fingers down the tube   DO NOT let bulb hang freely or let the bulb drag on the floor   ALWAYS keep bulb attached to clothing with a safety pin to prevent drain being pulled out   ALWAYS make sure tubing is secured to prevent drain being pulled out       WHEN TO SEEK HELP   If your drain/stitches fall out or are constantly leaking   If bulb fluid changes from light pink to dark red, if yellow/green drainage with foul smell drainage is coming from the site   Increased redness, pain, warmth or unusual swelling around the drain site or incisions   If you have shaking/chills or temperature >100.4??F   If you cannot compress the bulb   If you have non-stop nausea or vomiting     TROUBLE SHOOTING   Bulb will not compress ?? Check if the bulb needs to be emptied ?? Check for kinks in the tubing ?? Check to see if the plug is in the drainage ?? Attempt stripping the drain tube Leaking from the bulb ?? Check that the stopper plug is firmly in drainage port Leaking at the insertion site ?? Check to make sure drain is not pulling and is secured in place     Supply Checklist for home care: ? Measuring cup ? Alcohol swabs ? Safety pins ? Mild soap ex: Dove?? ? Sterile gauze (2x2,4x4) or cotton swab ? If there is a dressing: new dressings    How To Empty Your JP drain  Open bottom of pouch, pour contents into measuring container   Wipe end of pouch with damp paper towel to remove stool   Close pouch. If using Velcro style, be sure you feel the seal  Record the date, time, and amount of output on this sheet.  Empty measuring container contents into toilet and rinse for re-use   Wash hands thoroughly  Measure output daily and bring this sheet to your next follow up appointment      Measure and Record Your Bulb Output  Empty the bulb when it's 1/3 to ?? full.  Avoid pulling on the tubing while emptying              Date Time Output  Date Time Output    Leave dressing on      Until your follow up appt    Patient to schedule follow-up appointment with surgeon (specify time frame):      2 weeks    Remove dressing in 24 hours            Labs and Other Follow-ups after Discharge:  Follow Up instructions and Outpatient Referrals     Call MD for:  difficulty breathing, headache or visual disturbances      Call MD for:  persistent nausea or vomiting      Call MD for:  persistent nausea or vomiting      Call MD for:  redness, tenderness, or signs of infection (pain, swelling,   redness, odor or green/yellow discharge around incision site)      Call MD for:  redness, tenderness, or signs of infection (pain, swelling,   redness, odor or green/yellow discharge around incision site)      Call MD for:  severe uncontrolled pain      Call MD for:  severe uncontrolled pain  Call MD for: Temperature > 38.5 Celsius ( > 101.3 Fahrenheit)      Call MD for: Temperature > 38.5 Celsius ( > 101.3 Fahrenheit)      Discharge instructions      Discharge instructions          Future Appointments:  Appointments which have been scheduled for you      Apr 08, 2024 9:30 AM  (Arrive by 9:10 AM)  POST OP with Oliva Sprinkles, MD  [facility] GENERAL AND BARIATRIC SURGERY [facility] REGION) [address]  [address]  [place] [address]  [phone]        Apr 08, 2024 11:30 AM  (Arrive by 11:15 AM)  RETURN HEPATOLOGY with Vena Lowers, MD  [facility] GI MEDICINE [place] [facility] REGION) [address]  [facility] [address]  [place] [address]  [phone]        Apr 11, 2024 9:40 AM  (Arrive by 9:25 AM)  RETURN DIABETES with Toribio ELINORE Batch, MD  [facility] DIABETES AND ENDOCRINOLOGY [place] [facility] REGION) [address]  [facility] [address]  [place] [address]  [phone]           Teaching Surgeon Attestation:  I, Oliva Sprinkles, M.D., saw and evaluated the patient separate from the PA. I have reviewed and edited the above note, and I agree with the findings and the plan of care as documented in the PA's note. He will return to clinic to see me on Tuesday 10/14.

## 2024-04-03 DIAGNOSIS — E1165 Type 2 diabetes mellitus with hyperglycemia: Principal | ICD-10-CM

## 2024-04-03 DIAGNOSIS — Z794 Long term (current) use of insulin: Principal | ICD-10-CM

## 2024-04-03 MED ORDER — INSULIN ASPART (U-100) 100 UNIT/ML (3 ML) SUBCUTANEOUS PEN
Freq: Three times a day (TID) | SUBCUTANEOUS | 0 refills | 57.00000 days
Start: 2024-04-03 — End: ?

## 2024-04-03 NOTE — Unmapped (Signed)
 [facility] Refill Coordination Note    Specialty Medication(s) to be Shipped:   Transplant: mycophenolate  mofetil 250mg  and Prograf  0.5mg     Other medication(s) to be shipped: Novolog , Xifaxan , Tresiba , Mounjaro , gabapentin     Specialty Medications not needed at this time: N/A     RICK CARRUTHERS, DOB: [birth year]  Phone: There are no phone numbers on file.      All above HIPAA information was verified with patient.     Was a Nurse, learning disability used for this call? No    Completed refill call assessment today to schedule patient's medication shipment from the [facility]  [phone]).  All relevant notes have been reviewed.     Specialty medication(s) and dose(s) confirmed: Regimen is correct and unchanged.   Changes to medications: Irene reports no changes at this time.  Changes to insurance: No  New side effects reported not previously addressed with a pharmacist or physician: None reported  Questions for the pharmacist: No    Confirmed patient received a Conservation officer, historic buildings and a Surveyor, mining with first shipment. The patient will receive a drug information handout for each medication shipped and additional FDA Medication Guides as required.       DISEASE/MEDICATION-SPECIFIC INFORMATION        N/A    SPECIALTY MEDICATION ADHERENCE     Medication Adherence    Patient reported X missed doses in the last month: 0  Specialty Medication: mycophenolate  250 mg capsule (CELLCEPT )  Patient is on additional specialty medications: Yes  Additional Specialty Medications: PROGRAF  0.5 mg capsule (tacrolimus )  Patient Reported Additional Medication X Missed Doses in the Last Month: 0  Patient is on more than two specialty medications: No  Any gaps in refill history greater than 2 weeks in the last 3 months: no  Demonstrates understanding of importance of adherence: yes  Informant: patient  Adherence tools used: patient uses a pill box to manage medications  Confirmed plan for next specialty medication refill: delivery by pharmacy  Refills needed for supportive medications: not needed          Refill Coordination    Has the Patients' Contact Information Changed: No  Is the Shipping Address Different: No         Were doses missed due to medication being on hold? No    PROGRAF  0.5   mg: 7 days of medicine on hand   mycophenolate  250   mg: 7 days of medicine on hand       REFERRAL TO PHARMACIST     Referral to the pharmacist: Not needed      [facility]     Shipping address confirmed in Epic.     Cost and Payment: Patient has a $0 copay, payment information is not required.    Delivery Scheduled: Yes, Expected medication delivery date: 04/08/24.     Medication will be delivered via UPS to the temporary address in Epic WAM.    [name]   [facility]  Specialty Technician

## 2024-04-05 NOTE — Unmapped (Signed)
 [facility]  04/08/2024    Reason for visit: Follow up for Elevated AST (SGOT) [R74.01]    Assessment/Plan:    58 y.o. 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 No prior diagnosis of rejection or biliary issues.     #S/p OLT 2013 ([name] Cirrhosis): Transplant in 2013 for [name] cirrhosis. No history of rejection or biliary issues, however has developed evidence of portosystemic shunting with no fibrosis on biopsy and no elevated HVPG (see below). Had some episodes of HE, which have no resolved s/p effective iHD. CMV D-/R-  - Currenly on tac 1mg  BID, decrease to 0.5mg  BID, (goal 2-4) and MMF 250 BID  - Discussed importance of local PCP and monthly labs  - ALP elevated as is AST, liver biopsy 12/2023 with congestive hepatopathy, and similar liver enzyme pattern as today, sos suspect this is still the case. Will consider MRCP if ongoing issues (bx did not show large duct obstruction, however).   - Recheck tac trough on Monday, with labs.   - Can restart ursodiol  if tolerating this, but unlikely to have PBC based on biopsy. Will consider stopping this in the future.     #Portosystemic shunting w/episodes of HE (and truly elevated ammonia): Highly perplexing presentation with varices on EGD, multiple PSS, and true episodes of HE responsive to lactulose  (ammonia elevated during these episodes as well for what it is worth), but no fibrosis on liver biopsy 01/11/2024 and normal HVPG. Wonder about SFSS (graft appears small on imaging), and while this usually presents w/ascites, if developed extensive shunting this may not develop. On last hospitalization, I reviewed his imaging with VIR and shunt embolization was going to be difficult, so discussion around splenic artery embolization, but ultimately decided against it as the thought was this may be further optimized with more appropriate dialysis (mental status may have been impacted by uremia as well).   - Since dialysis improved, HE has improved remarkably. No issues w/HE since hospitalization.   - Patient stopped taking lactulose /rifaximin  with no further HE.     #Portal Hypertension: Portal hypertension without evidence of cirrhosis on biopsy (see above re thoughts on PSS/portal HTN). As such, would recommend EV surveillance, but not HCC screening. Liver biopsy 12/2023 with e/o congestion, NO fibrosis, no e/o PBC, and no rejection.  - EV: EGD 12/2023 with G1 varices. Repeat in 1 year given uncertain underlying pathology, and cannot tolerate coreg     #ESRD: Was evaluated for kidney transplant, but evaluation was closed in s/o sub-optimally controlled diabetes and comorbidities (HFpEF as well), but may be able to revisit if optimizes. It does appear he's lost weight (BMI 37 from 40)    #Splenic Artery Pseudoaneurysm: missed appointment with Dr. Gretta 04/01/2024 to discuss embolization  - Will message to get patient back in with Dr. Gretta    #Preventive Hepatology Care:  - Hepatitis A Vaccine: second dose after December 2025  - Hepatitis B Vaccine: Second dose heplisav today  - S/p PCV 21, shingrix , Tdap (all UTD)  - Flu shot: got it at dialysis  - COVID: Declined covid  - Colonoscopy: 12/2023, due for repeat in 7 years  - Skin exams/skin cancer: follows with dermatology, last seen in 2023. Recommended for 1 year follow up.    No follow-ups on file.    Richard Lowers, MD  Transplant Hepatology  Assistant Professor of Medicine  [employer]    Subjective   History of Present Illness   Accompanied by: N/A (unaccompanied)    58 y.o. 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    History of Present Illness  Richard [name] is a 58 year old male with hepatic encephalopathy and dialysis-dependent renal failure who presents for follow-up after recent fistula surgery.    He experiences mild pain in his arm following recent surgery on Monday to address issues with his dialysis fistula. A drain is currently in place, and stitches are scheduled for removal on Friday.    He has a history of hepatic encephalopathy and discontinued lactulose  and rifaximin  over two months ago due to lack of bowel movements with lactulose . Despite stopping these medications, he has not experienced any episodes of hepatic encephalopathy in the past two months. He attributes this improvement to effective dialysis, noting that the dialysis machine consistently runs 'green,' indicating good cleaning and believes this impacts his ammonia levels and overall condition.    He had missed doses of ursodiol  for a few weeks but restarted it last night. He has a history of elevated alkaline phosphatase levels.    He has missed several appointments due to hospitalizations but emphasizes that these absences were not intentional.    He is up to date on most vaccines, including the flu shot received at the dialysis center, but has declined the COVID-19 vaccine. He last saw a dermatologist two years ago and uses sunscreen when outdoors.      Objective   Physical Exam   Vital Signs: BP 111/66 (BP Site: L Arm, BP Position: Sitting, BP Cuff Size: Medium)  - Pulse 78  - Temp 36.2 ??C (97.2 ??F) (Temporal)  - Ht 177.8 cm (5' 10)  - Wt (!) 119 kg (262 lb 6.4 oz)  - SpO2 98%  - BMI 37.65 kg/m??   Constitutional: He is in no apparent distress  Eyes: Anicteric sclerae  Cardiovascular: No peripheral edema  Gastrointestinal: Soft, nontender abdomen without hepatosplenomegaly, hernias, or masses  Neurologic: Awake, alert, and oriented to person, place, and time with normal speech and no asterixis

## 2024-04-07 MED FILL — TRESIBA FLEXTOUCH U-200 INSULIN 200 UNIT/ML (3 ML) SUBCUTANEOUS PEN: SUBCUTANEOUS | 54 days supply | Qty: 27 | Fill #0

## 2024-04-07 MED FILL — MYCOPHENOLATE MOFETIL 250 MG CAPSULE: ORAL | 30 days supply | Qty: 60 | Fill #2

## 2024-04-07 MED FILL — MOUNJARO 15 MG/0.5 ML SUBCUTANEOUS PEN INJECTOR: SUBCUTANEOUS | 28 days supply | Qty: 2 | Fill #1

## 2024-04-07 MED FILL — GABAPENTIN 300 MG CAPSULE: ORAL | 30 days supply | Qty: 30 | Fill #5

## 2024-04-08 ENCOUNTER — Ambulatory Visit: Admit: 2024-04-08 | Discharge: 2024-04-08 | Payer: BLUE CROSS/BLUE SHIELD

## 2024-04-08 DIAGNOSIS — R7401 Elevated AST (SGOT): Principal | ICD-10-CM

## 2024-04-08 DIAGNOSIS — Z944 Liver transplant status: Principal | ICD-10-CM

## 2024-04-08 DIAGNOSIS — Z796 Long-term use of immunosuppressant medication: Principal | ICD-10-CM

## 2024-04-08 LAB — CBC W/ AUTO DIFF
BASOPHILS ABSOLUTE COUNT: 0.1 10*9/L (ref 0.0–0.1)
BASOPHILS RELATIVE PERCENT: 1.3 %
EOSINOPHILS ABSOLUTE COUNT: 0.4 10*9/L (ref 0.0–0.5)
EOSINOPHILS RELATIVE PERCENT: 6.3 %
HEMATOCRIT: 38.4 % — ABNORMAL LOW (ref 39.0–48.0)
HEMOGLOBIN: 12.7 g/dL — ABNORMAL LOW (ref 12.9–16.5)
LYMPHOCYTES ABSOLUTE COUNT: 1.4 10*9/L (ref 1.1–3.6)
LYMPHOCYTES RELATIVE PERCENT: 25.1 %
MEAN CORPUSCULAR HEMOGLOBIN CONC: 33.2 g/dL (ref 32.0–36.0)
MEAN CORPUSCULAR HEMOGLOBIN: 28.4 pg (ref 25.9–32.4)
MEAN CORPUSCULAR VOLUME: 85.5 fL (ref 77.6–95.7)
MEAN PLATELET VOLUME: 7.8 fL (ref 6.8–10.7)
MONOCYTES ABSOLUTE COUNT: 0.6 10*9/L (ref 0.3–0.8)
MONOCYTES RELATIVE PERCENT: 11.1 %
NEUTROPHILS ABSOLUTE COUNT: 3.2 10*9/L (ref 1.8–7.8)
NEUTROPHILS RELATIVE PERCENT: 56.2 %
NUCLEATED RED BLOOD CELLS: 0 /100{WBCs} (ref <=4)
PLATELET COUNT: 211 10*9/L (ref 150–450)
RED BLOOD CELL COUNT: 4.49 10*12/L (ref 4.26–5.60)
RED CELL DISTRIBUTION WIDTH: 14.4 % (ref 12.2–15.2)
WBC ADJUSTED: 5.8 10*9/L (ref 3.6–11.2)

## 2024-04-08 LAB — COMPREHENSIVE METABOLIC PANEL
ALBUMIN: 3.2 g/dL — ABNORMAL LOW (ref 3.4–5.0)
ALKALINE PHOSPHATASE: 379 U/L — ABNORMAL HIGH (ref 46–116)
ALT (SGPT): <7 U/L — ABNORMAL LOW (ref 10–49)
ANION GAP: 18 mmol/L — ABNORMAL HIGH (ref 5–14)
AST (SGOT): 51 U/L — ABNORMAL HIGH (ref <=34)
BILIRUBIN TOTAL: 0.8 mg/dL (ref 0.3–1.2)
BLOOD UREA NITROGEN: 33 mg/dL — ABNORMAL HIGH (ref 9–23)
BUN / CREAT RATIO: 3
CALCIUM: 8.1 mg/dL — ABNORMAL LOW (ref 8.7–10.4)
CHLORIDE: 93 mmol/L — ABNORMAL LOW (ref 98–107)
CO2: 26 mmol/L (ref 20.0–31.0)
CREATININE: 10.87 mg/dL — ABNORMAL HIGH (ref 0.73–1.18)
EGFR CKD-EPI (2021) MALE: 5 mL/min/1.73m2 — ABNORMAL LOW (ref >=60)
GLUCOSE RANDOM: 350 mg/dL — ABNORMAL HIGH (ref 70–179)
POTASSIUM: 4.8 mmol/L (ref 3.4–4.8)
PROTEIN TOTAL: 7.3 g/dL (ref 5.7–8.2)
SODIUM: 137 mmol/L (ref 135–145)

## 2024-04-08 LAB — GAMMA GT: GAMMA GLUTAMYL TRANSFERASE: 374 U/L — ABNORMAL HIGH (ref 0–73)

## 2024-04-08 LAB — CK: CREATINE KINASE TOTAL: 83 U/L (ref 46.0–171.0)

## 2024-04-08 LAB — MAGNESIUM: MAGNESIUM: 2.5 mg/dL (ref 1.6–2.6)

## 2024-04-08 LAB — BILIRUBIN, DIRECT: BILIRUBIN DIRECT: 0.4 mg/dL — ABNORMAL HIGH (ref 0.00–0.30)

## 2024-04-08 LAB — PHOSPHORUS: PHOSPHORUS: 7.5 mg/dL — ABNORMAL HIGH (ref 2.4–5.1)

## 2024-04-08 LAB — TACROLIMUS LEVEL: TACROLIMUS BLOOD: 6.8 ng/mL

## 2024-04-08 MED ORDER — TACROLIMUS 0.5 MG CAPSULE, IMMEDIATE-RELEASE
ORAL_CAPSULE | Freq: Two times a day (BID) | ORAL | 3 refills | 180.00000 days | Status: CP
Start: 2024-04-08 — End: ?
  Filled 2024-04-07: qty 120, 30d supply, fill #2
  Filled 2024-05-07: qty 60, 30d supply, fill #0

## 2024-04-08 NOTE — Unmapped (Signed)
 Per Dr. Charlett patient to reduce tacrolimus  dosing from 1mg  in the AM and 0.5mg  in the PM to 0.5mg  BID - call placed to patient to relay this he verbalized understanding.  Discussed provider interest for patient to repeat labs on Monday - confirmed standing lab order on file for [facility] collection.    Alk phos and ggt mildly elevated today - provider advised patient to resume ursodiol , no other changes at this time.      Referral placed to [facility] per patient request (hasn't been seen by derm for >2 years and desired referral @ [facility]).  Discussed need to reschedule appt with [facility]IR with Dr. Gerard Gaskins - he confirms he will call today to reschedule this and declines needing coordinator assistance to secure this reschedule.

## 2024-04-09 MED ORDER — INSULIN ASPART (U-100) 100 UNIT/ML (3 ML) SUBCUTANEOUS PEN
Freq: Three times a day (TID) | SUBCUTANEOUS | 0 refills | 57.00000 days | Status: CP
Start: 2024-04-09 — End: ?
  Filled 2024-04-14: qty 60, 57d supply, fill #0

## 2024-04-10 NOTE — Unmapped (Signed)
 1st Call Attempt 04/10/24,Unable to reach patient at this time. LVM to return call to clinic,to schedule appt.    From: Gretta Gerard Dines, MD   Sent: 04/09/2024   5:39 PM EDT   To: Dianah SAUNDERS Dameron, RN   Subject: RE: Clinic visit                                 Hi! Yes, he needs to be seen in clinic prior to the embolization. Once he is able to come to a clinic appointment we can schedule his embolization.

## 2024-04-10 NOTE — Unmapped (Signed)
 He is s/p revision and transposition of right upper arm basilic vein fistula on 10/6  A seroma was drained as well and I placed a closed suction drain at the time of surgery  I have brought him back today for an early postoperative check and possibly remove the drain  He has been keeping good records of the output and it appears that he is still draining about 60-70 ml/day of clear straw colored fluid, but the volume appears to be decreasing each day  The incision is healing well with no signs of infection  There is a brisk thrill over the outflow  There is no edema   He is pleased with his progress  He will return in another 3 days to be checked and possibly have the drain removed.

## 2024-04-10 NOTE — Unmapped (Unsigned)
 Endocrinology Clinic Follow-up Visit Note    ASSESSMENT AND PLAN:     Richard [name] is a 58 y.o. male with a PMHx of ESRD (due to CNI toxicity) on HD TTS, [name] cirrhosis s/p OLT 2013, T2DM, HFpEF, HLD, HTN who is seen today for follow-up regarding diabetes.    Type 2 diabetes mellitus, uncontrolled, complicated by neuropathy, ESRD, cirrhosis with long-term current use of insulin    Lab Results   Component Value Date    A1C 10.0 (A) 01/14/2024    A1C 11.8 08/28/2023    A1C 10.5 03/01/2023    A1C 8.3 (H) 08/04/2022    A1C 10.5 (H) 04/24/2022     Goal A1c < 7% without hypoglycemia  CGM Dexcom G7  ***  PLAN  Follow with [name] or [name]?***  Tresiba  U-200 55-60 units daily  Novolog  35 units TID AC***  Mounjaro  15 mg weekly  Discussed hypoglycemia plan and treatment  Patient prescribed glucagon ***    Diabetes-Related Health Maintenance and Screening  Hypertension: ***  Hyperlipidemia: LDL ***  Retinopathy: ***  Ophthalmologist/optometrist: ***  Last visit: ***  Neuropathy: Last foot exam ***  Nephropathy: Alb/Cr ratio ***  MASLD/[name]:   FIB-4 Calculation: 1.46 at 01/31/2024  5:26 AM  Calculated from:  SGOT/AST: 15 U/L at 01/31/2024  5:26 AM  SGPT/ALT: 8 U/L at 01/21/2024  4:49 AM  Platelets: 203 10*9/L at 01/31/2024  5:26 AM  Age: 30 years  Reproductive: ***  Autoimmune workup:   TSH: ***  Celiac: no sx  Other autoimmune diseases: n/a    BP Readings from Last 10 Encounters:   04/08/24 111/66   04/08/24 134/80   04/02/24 129/88   03/11/24 132/60   02/26/24 137/74   02/26/24 132/71   02/20/24 136/80   02/05/24 145/80   02/03/24 130/68   01/24/24 117/62       Lab Results   Component Value Date    LDL Cholesterol, Calculated 85 04/15/2012    Cholesterol, LDL, Calculated 104 (H) 04/24/2022    Cholesterol, LDL, Calculated 95 08/29/2021    Cholesterol, LDL, Calculated 100 (H) 05/11/2020       Lab Results   Component Value Date    Microalbumin/Creatinine Ratio 160.6 (H) 04/13/2014    Albumin/Creatinine Ratio 1,316.3 (H) 06/06/2022    Albumin/Creatinine Ratio 1,024.2 (H) 09/16/2021     Lab Results   Component Value Date    Creatinine 10.87 (H) 04/08/2024    Creatinine 7.48 (H) 04/02/2024    Creatinine 10.15 (H) 04/01/2024    Creatinine 5.24 (H) 02/26/2024    Creatinine 6.57 (H) 02/03/2024    Creatinine 1.33 10/06/2014    Creatinine 1.47 (H) 03/16/2014    Creatinine 1.84 (H) 08/20/2013    Creatinine 1.52 (H) 03/17/2013    Creatinine 1.29 03/10/2013    Creatinine 1.29 03/10/2013       Lab Results   Component Value Date    TSH 3.193 01/26/2024    TSH 1.401 01/15/2024    TSH 2.539 11/09/2022    TSH 2.65 05/07/2012    TSH 1.11 12/19/2011    TSH 2.04 09/27/2011       There are no Patient Instructions on file for this visit.    No follow-ups on file.    Patient was staffed with Dr. FERNAND    Toribio Batch, MD  Endocrinology Fellow PGY-5    SUBJECTIVE:     Today:  History of Present Illness      ***      Diabetes  History:  Patient has a history of Type 2 diabetes diagnosed around 8 years ago after liver transplant.  Current home blood glucose monitoring: Dexcom G7.  Hypoglycemia awareness: Yes.  Complications related to diabetes: peripheral neuropathy and ESRD on dialysis    Medications   Current Medications[1]           OBJECTIVE:     Physical Exam:  There were no vitals taken for this visit.    Wt Readings from Last 10 Encounters:   04/08/24 (!) 119 kg (262 lb 6.4 oz)   04/08/24 (!) 119.3 kg (263 lb)   03/31/24 (!) 123.9 kg (273 lb 2.4 oz)   03/11/24 (!) 118.1 kg (260 lb 4.8 oz)   02/26/24 (!) 118.8 kg (262 lb)   02/05/24 (!) 119.8 kg (264 lb 1.6 oz)   01/26/24 (!) 123.2 kg (271 lb 9.7 oz)   01/24/24 (!) 122.5 kg (270 lb)   01/17/24 (!) 122.5 kg (270 lb)   01/14/24 (!) 122.8 kg (270 lb 12.8 oz)       General: male in no apparent distress  HEENT: EOMI, sclera anicteric, no thyromegaly***, no exophthalmos, no cushingoid features  Respiratory: No increased work of breathing on RA  MSK: Foot exam: normal monofilament testing at 8 out of 8 sites bilaterally, no wounds or lesions, normal dorsalis pedis pulses***  Neuro: Awake, alert, and oriented; no tremors***  Psych: Normal mood and affect  Skin: No abnormal skin pigmentation      Labs:  I personally reviewed labs available in Epic prior to the start of today's visit.      Lab Results   Component Value Date    A1C 10.0 (A) 01/14/2024    A1C 11.8 08/28/2023    A1C 10.5 03/01/2023    A1C 8.3 (H) 08/04/2022    A1C 10.5 (H) 04/24/2022     Lab Results   Component Value Date    TRIG 369 (H) 04/24/2022    CHOL 211 (H) 04/24/2022    HDL 33 (L) 04/24/2022    LDL 104 (H) 04/24/2022     Lab Results   Component Value Date    TSH 3.193 01/26/2024    FREET4 1.15 05/07/2012     Lab Results   Component Value Date    CREATININE 10.87 (H) 04/08/2024    ALBCRERAT 1,316.3 (H) 06/06/2022     Lab Results   Component Value Date    MALBCRERAT 160.6 (H) 04/13/2014     No results found for: CPEPTIDE  Appointment on 04/08/2024   Component Date Value    Sodium 04/08/2024 137     Potassium 04/08/2024 4.8     Chloride 04/08/2024 93 (L)     CO2 04/08/2024 26.0     Anion Gap 04/08/2024 18 (H)     BUN 04/08/2024 33 (H)     Creatinine 04/08/2024 10.87 (H)     BUN/Creatinine Ratio 04/08/2024 3     eGFR CKD-EPI (2021) Male 04/08/2024 5 (L)     Glucose 04/08/2024 350 (H)     Calcium  04/08/2024 8.1 (L)     Albumin 04/08/2024 3.2 (L)     Total Protein 04/08/2024 7.3     Total Bilirubin 04/08/2024 0.8     AST 04/08/2024 51 (H)     ALT 04/08/2024 <7 (L)     Alkaline Phosphatase 04/08/2024 379 (H)     Bilirubin, Direct 04/08/2024 0.40 (H)     GGT 04/08/2024 374 (H)     Magnesium  04/08/2024  2.5     Phosphorus 04/08/2024 7.5 (H)     Tacrolimus , Timed 04/08/2024 6.8     WBC 04/08/2024 5.8     RBC 04/08/2024 4.49     HGB 04/08/2024 12.7 (L)     HCT 04/08/2024 38.4 (L)     MCV 04/08/2024 85.5     MCH 04/08/2024 28.4     MCHC 04/08/2024 33.2     RDW 04/08/2024 14.4     MPV 04/08/2024 7.8     Platelet 04/08/2024 211     nRBC 04/08/2024 0 Neutrophils % 04/08/2024 56.2     Lymphocytes % 04/08/2024 25.1     Monocytes % 04/08/2024 11.1     Eosinophils % 04/08/2024 6.3     Basophils % 04/08/2024 1.3     Absolute Neutrophils 04/08/2024 3.2     Absolute Lymphocytes 04/08/2024 1.4     Absolute Monocytes 04/08/2024 0.6     Absolute Eosinophils 04/08/2024 0.4     Absolute Basophils 04/08/2024 0.1     Creatine Kinase, Total 04/08/2024 83.0    Admission on 03/31/2024, Discharged on 04/02/2024   Component Date Value    Potassium 03/31/2024 4.3     Glucose, POC 03/31/2024 155     Glucose, POC 03/31/2024 265 (H)     Glucose, POC 03/31/2024 476 (HH)     Glucose, POC 03/31/2024 453 (HH)     Tacrolimus , Trough 04/01/2024 4.5 (L)     Glucose, POC 04/01/2024 354 (H)     WBC 04/01/2024 8.7     RBC 04/01/2024 4.00 (L)     HGB 04/01/2024 11.5 (L)     HCT 04/01/2024 34.1 (L)     MCV 04/01/2024 85.3     MCH 04/01/2024 28.6     MCHC 04/01/2024 33.6     RDW 04/01/2024 14.1     MPV 04/01/2024 7.9     Platelet 04/01/2024 194     Sodium 04/01/2024 134 (L)     Potassium 04/01/2024 6.0 (H)     Chloride 04/01/2024 95 (L)     CO2 04/01/2024 21.1     Anion Gap 04/01/2024 18 (H)     BUN 04/01/2024 56 (H)     Creatinine 04/01/2024 10.15 (H)     BUN/Creatinine Ratio 04/01/2024 6     eGFR CKD-EPI (2021) Male 04/01/2024 5 (L)     Glucose 04/01/2024 376 (H)     Calcium  04/01/2024 7.3 (L)     Albumin 04/01/2024 2.9 (L)     Total Protein 04/01/2024 6.5     Total Bilirubin 04/01/2024 0.4     AST 04/01/2024 15     ALT 04/01/2024 <7 (L)     Alkaline Phosphatase 04/01/2024 132 (H)     Glucose, POC 04/01/2024 394 (H)     Glucose, POC 04/01/2024 357 (H)     Hep B Surface Ag 04/01/2024 Nonreactive     Glucose, POC 04/01/2024 222 (H)     Glucose, POC 04/01/2024 411 (HH)     Glucose, POC 04/01/2024 445 (HH)     WBC 04/02/2024 8.6     RBC 04/02/2024 4.07 (L)     HGB 04/02/2024 11.5 (L)     HCT 04/02/2024 34.7 (L)     MCV 04/02/2024 85.2     MCH 04/02/2024 28.2     MCHC 04/02/2024 33.1     RDW 04/02/2024 13.9     MPV 04/02/2024 8.0     Platelet 04/02/2024 155     Sodium 04/02/2024  137     Potassium 04/02/2024 5.0 (H)     Chloride 04/02/2024 98     CO2 04/02/2024 22.4     Anion Gap 04/02/2024 17 (H)     BUN 04/02/2024 27 (H)     Creatinine 04/02/2024 7.48 (H)     BUN/Creatinine Ratio 04/02/2024 4     eGFR CKD-EPI (2021) Male 04/02/2024 8 (L)     Glucose 04/02/2024 188 (H)     Calcium  04/02/2024 7.4 (L)     Albumin 04/02/2024 3.1 (L)     Total Protein 04/02/2024 6.9     Total Bilirubin 04/02/2024 0.4     AST 04/02/2024 20     ALT 04/02/2024 <7 (L)     Alkaline Phosphatase 04/02/2024 149 (H)     Glucose, POC 04/02/2024 311 (H)     Glucose, POC 04/02/2024 204 (H)     Glucose, POC 04/02/2024 164     Glucose, POC 04/02/2024 142    Admission on 02/26/2024, Discharged on 02/26/2024   Component Date Value    Sodium 02/26/2024 137     Potassium 02/26/2024 5.2 (H)     Chloride 02/26/2024 95 (L)     CO2 02/26/2024 25.4     Anion Gap 02/26/2024 17 (H)     BUN 02/26/2024 14     Creatinine 02/26/2024 5.24 (H)     BUN/Creatinine Ratio 02/26/2024 3     eGFR CKD-EPI (2021) Male 02/26/2024 12 (L)     Glucose 02/26/2024 261 (H)     Calcium  02/26/2024 8.3 (L)     Albumin 02/26/2024 3.7     Total Protein 02/26/2024 7.6     Total Bilirubin 02/26/2024 0.7     AST 02/26/2024 26     ALT 02/26/2024 <7 (L)     Alkaline Phosphatase 02/26/2024 168 (H)     WBC 02/26/2024 5.7     RBC 02/26/2024 4.36     HGB 02/26/2024 12.5 (L)     HCT 02/26/2024 37.7 (L)     MCV 02/26/2024 86.4     MCH 02/26/2024 28.6     MCHC 02/26/2024 33.1     RDW 02/26/2024 13.9     MPV 02/26/2024 8.1     Platelet 02/26/2024 174     nRBC 02/26/2024 0     Neutrophils % 02/26/2024 75.6     Lymphocytes % 02/26/2024 11.3     Monocytes % 02/26/2024 8.0     Eosinophils % 02/26/2024 4.1     Basophils % 02/26/2024 1.0     Absolute Neutrophils 02/26/2024 4.3     Absolute Lymphocytes 02/26/2024 0.6 (L)     Absolute Monocytes 02/26/2024 0.5     Absolute Eosinophils 02/26/2024 0.2     Absolute Basophils 02/26/2024 0.1     Lactate, Venous 02/26/2024 2.7 (H)     EKG Ventricular Rate 02/26/2024 62     EKG Atrial Rate 02/26/2024 62     EKG P-R Interval 02/26/2024 350     EKG QRS Duration 02/26/2024 138     EKG Q-T Interval 02/26/2024 540     EKG QTC Calculation 02/26/2024 548     EKG Calculated P Axis 02/26/2024 48     EKG Calculated R Axis 02/26/2024 -87     EKG Calculated T Axis 02/26/2024 0     QTC Fredericia 02/26/2024 546     Glucose, POC 02/26/2024 272 (H)    Admission on 02/20/2024, Discharged on 02/20/2024   Component Date Value  Potassium 02/20/2024 4.0     Glucose, POC 02/20/2024 174     Glucose, POC 02/20/2024 151    No results displayed because visit has over 200 results.      No results displayed because visit has over 200 results.      Office Visit on 01/14/2024   Component Date Value    Glucose, POC 01/14/2024 247 (A)     Glucose Strip Lot Num 01/14/2024 [phone]     Glucose Strip Exp 01/14/2024 [phone]     Hemoglobin A1C 01/14/2024 10.0 (A)     A1C LOT NBR 01/14/2024 890     A1C STRIP EXP 01/14/2024 4/2,027    Admission on 01/11/2024, Discharged on 01/11/2024   Component Date Value    Diagnosis 01/11/2024                      Value:A: Liver allograft, 11 years and 10 months post orthotopic liver transplant (transplanted on 03/02/2012), core needle biopsy  - Negative for T-cell mediated (acute cellular) rejection, RAI = 0/9  - No ductopenia identified  - Moderate zone 3 sinusoidal dilatation and mild macrovesicular steatosis (5%)  - Increased hepatocellular iron deposition, grade 1-2  - See comment     This electronic signature is attestation that the pathologist personally reviewed the submitted material(s) and the final diagnosis reflects that evaluation.      Diagnosis Comment 01/11/2024                      Value:Scopic examination demonstrates 2 cores of hepatic parenchyma with 10-12 portal tracts present for evaluation.  No significant portal inflammation is seen. No bile duct damage or endothelialitis is appreciated.  There is scant macrovesicular steatosis involving less than 5% of hepatic parenchyma without evidence of ballooning degeneration.  Lobules are remarkable for scattered ceroid laden macrophages and mild sinusoidal dilation without evidence of significant congestion.  No granulomas nor significant cholestasis are appreciated.  A trichrome stain shows usual portal fibrosis.  An iron stain shows mildly increased hepatocyte iron (grade 1-2) and more pronounced iron deposition in Kupffer cells.    The clinical concerns for graft cirrhosis and primary biliary cholangitis (PBC) are noted. There is no significant fibrosis in the sampled liver, and there is no ductular inflammation to suggest PBC. The sinusoidal dilatation is suggestive of congestive hepatopathy. The mild iron overload                           is located mainly within Kupffer cells, indicative of secondary iron overload. Secondary iron overload can be seen in a number of conditions, including parenteral iron supplementation, anemia of chronic disease, and longstanding dialysis.       Clinical History 01/11/2024                      Value:58 year old with history of [name] cirrhosis, s/p liver transplant in 2013. Now with elevated LFTs and positive AMA. Additional medical history includes poorly controlled diabetes, end stage renal disease on dialysis, and heart failure with preserved ejection fraction.      Latest Reference Range & Units 12/24/23 10:23 01/11/24 09:43   Total Protein 5.7 - 8.2 g/dL 7.4 7.4   Total Bilirubin 0.3 - 1.2 mg/dL 1.9 (H) 1.4 (H)   Bilirubin, Direct 0.00 - 0.30 mg/dL 9.09 (H) 9.49 (H)   SGOT (AST) <=34 U/L 182 (H) 23  ALT 10 - 49 U/L 113 (H) 27   Alkaline Phosphatase 46 - 116 U/L 387 (H) 226 (H)   GGT 0 - 73 U/L 502 (H) 192 (H)   (H): Data is abnormally high      Gross Description 01/11/2024                      Value:A.   Label: Liver  Size: 11 x 2 x 1 mm  Appearance: Aggregate of 2 tan core biopsy fragments  Block Summary: A1, NTR    (Currier)      Microscopic Description 01/11/2024                      Value:Microscopic examination substantiates the above diagnosis.      Disclaimer 01/11/2024                      Value:Unless otherwise specified, specimens are preserved using 10% neutral buffered formalin. For cases in which immunohistochemical and/or in-situ hybridization stains are performed, the following statement applies: Appropriate controls for each stain (positive controls with or without negative controls) have been evaluated and stain as expected. These stains have not been separately validated for use on decalcified specimens and should be interpreted with caution in that setting. Some of the reagents used for these stains may be classified as analyte specific reagents (ASR). Tests using ASRs were developed, and their performance characteristics were determined, by the Anatomic Pathology Department [facility]). They have not been cleared or approved by the [place]  Food and Drug Administration (FDA). The FDA does not require these tests to go through premarket FDA review. These tests are used for clinical purposes. They should not be regarded as investigational or for                           research. This laboratory is certified under the Clinical Laboratory Improvement Amendments (CLIA) as qualified to perform high complexity clinical laboratory testing.      Potassium, Bld 01/11/2024 3.5     Sodium 01/11/2024 137     Potassium 01/11/2024 3.6     Chloride 01/11/2024 97 (L)     CO2 01/11/2024 27.0     Anion Gap 01/11/2024 13     BUN 01/11/2024 17     Creatinine 01/11/2024 6.64 (H)     BUN/Creatinine Ratio 01/11/2024 3     eGFR CKD-EPI (2021) Male 01/11/2024 9 (L)     Glucose 01/11/2024 258 (H)     Calcium  01/11/2024 9.6     Albumin 01/11/2024 3.5     Total Protein 01/11/2024 7.4     Total Bilirubin 01/11/2024 1.4 (H)     AST 01/11/2024 23     ALT 01/11/2024 27     Alkaline Phosphatase 01/11/2024 226 (H)     Bilirubin, Direct 01/11/2024 0.50 (H)     GGT 01/11/2024 192 (H)     Magnesium  01/11/2024 2.1     Phosphorus 01/11/2024 3.1     Tacrolimus , Timed 01/11/2024 8.9     WBC 01/11/2024 5.3     RBC 01/11/2024 4.93     HGB 01/11/2024 14.1     HCT 01/11/2024 43.5     MCV 01/11/2024 88.2     MCH 01/11/2024 28.6     MCHC 01/11/2024 32.4     RDW 01/11/2024 13.7     MPV 01/11/2024 8.9  Platelet 01/11/2024 210     Neutrophils % 01/11/2024 74.4     Lymphocytes % 01/11/2024 13.6     Monocytes % 01/11/2024 8.3     Eosinophils % 01/11/2024 3.0     Basophils % 01/11/2024 0.7     Absolute Neutrophils 01/11/2024 3.9     Absolute Lymphocytes 01/11/2024 0.7 (L)     Absolute Monocytes 01/11/2024 0.4     Absolute Eosinophils 01/11/2024 0.2     Absolute Basophils 01/11/2024 0.0     Glucose, POC 01/11/2024 259 (H)     Glucose, POC 01/11/2024 194 (H)    Appointment on 12/24/2023   Component Date Value    AFP-Tumor Marker 12/24/2023 <2     Sodium 12/24/2023 134 (L)     Potassium 12/24/2023 4.3     Chloride 12/24/2023 96 (L)     CO2 12/24/2023 22.0     Anion Gap 12/24/2023 16 (H)     BUN 12/24/2023 34 (H)     Creatinine 12/24/2023 10.15 (H)     BUN/Creatinine Ratio 12/24/2023 3     eGFR CKD-EPI (2021) Male 12/24/2023 6 (L)     Glucose 12/24/2023 227 (H)     Calcium  12/24/2023 10.0     Albumin 12/24/2023 3.7     Total Protein 12/24/2023 7.4     Total Bilirubin 12/24/2023 1.9 (H)     AST 12/24/2023 182 (H)     ALT 12/24/2023 113 (H)     Alkaline Phosphatase 12/24/2023 387 (H)     Bilirubin, Direct 12/24/2023 0.90 (H)     GGT 12/24/2023 502 (H)     Magnesium  12/24/2023 2.4     Phosphorus 12/24/2023 5.0     Tacrolimus , Timed 12/24/2023 18.1     PT 12/24/2023 12.8 (H)     INR 12/24/2023 1.12     WBC 12/24/2023 6.9     RBC 12/24/2023 4.77     HGB 12/24/2023 14.1     HCT 12/24/2023 42.8     MCV 12/24/2023 89.8     MCH 12/24/2023 29.6     MCHC 12/24/2023 33.0     RDW 12/24/2023 15.0 MPV 12/24/2023 8.7     Platelet 12/24/2023 277     Neutrophils % 12/24/2023 74.8     Lymphocytes % 12/24/2023 11.8     Monocytes % 12/24/2023 9.2     Eosinophils % 12/24/2023 3.7     Basophils % 12/24/2023 0.5     Absolute Neutrophils 12/24/2023 5.1     Absolute Lymphocytes 12/24/2023 0.8 (L)     Absolute Monocytes 12/24/2023 0.6     Absolute Eosinophils 12/24/2023 0.3     Absolute Basophils 12/24/2023 0.0     IgM 12/24/2023 142     Anti Mitochondrial Ab 12/24/2023 Positive (A)     Anti-Mitochondrial Level 12/24/2023 34.8    Appointment on 12/04/2023   Component Date Value    Sodium 12/04/2023 141     Potassium 12/04/2023 3.8     Chloride 12/04/2023 96 (L)     CO2 12/04/2023 30.7     Anion Gap 12/04/2023 14     BUN 12/04/2023 11     Creatinine 12/04/2023 4.13 (H)     BUN/Creatinine Ratio 12/04/2023 3     eGFR CKD-EPI (2021) Male 12/04/2023 16 (L)     Glucose 12/04/2023 223 (H)     Calcium  12/04/2023 9.5     Albumin 12/04/2023 3.6     Total Protein 12/04/2023 7.6     Total  Bilirubin 12/04/2023 0.9     AST 12/04/2023 29     ALT 12/04/2023 31     Alkaline Phosphatase 12/04/2023 168 (H)     Bilirubin, Direct 12/04/2023 0.30     Phosphorus 12/04/2023 3.2     Magnesium  12/04/2023 2.1     GGT 12/04/2023 92 (H)     Tacrolimus , Timed 12/04/2023 3.6     WBC 12/04/2023 7.1     RBC 12/04/2023 4.63     HGB 12/04/2023 13.5     HCT 12/04/2023 39.9     MCV 12/04/2023 86.2     MCH 12/04/2023 29.1     MCHC 12/04/2023 33.8     RDW 12/04/2023 14.7     MPV 12/04/2023 7.7     Platelet 12/04/2023 197     nRBC 12/04/2023 0     Neutrophils % 12/04/2023 73.6     Lymphocytes % 12/04/2023 15.8     Monocytes % 12/04/2023 7.2     Eosinophils % 12/04/2023 2.9     Basophils % 12/04/2023 0.5     Absolute Neutrophils 12/04/2023 5.2     Absolute Lymphocytes 12/04/2023 1.1     Absolute Monocytes 12/04/2023 0.5     Absolute Eosinophils 12/04/2023 0.2     Absolute Basophils 12/04/2023 0.0    There may be more visits with results that are not included. Lab Results   Component Value Date    NA 137 04/08/2024    K 4.8 04/08/2024    CL 93 (L) 04/08/2024    CO2 26.0 04/08/2024    BUN 33 (H) 04/08/2024    CREATININE 10.87 (H) 04/08/2024    GLUCOSE 196 03/03/2012    GFR 44.92 (L) 09/03/2012    AST 51 (H) 04/08/2024    PROT 7.3 04/08/2024    ALBUMIN 3.2 (L) 04/08/2024     No results found for: FSH, LH  No results found for: ESTRADIOL  No results found for: TESTOSTERONE   No results found for: PROLACTIN  No components found for: ILGF1  No results found for: CORTISOL  No components found for: ALDOSTERON  Lab Results   Component Value Date    PTH 267.5 (H) 10/14/2021     No results found for: CA  Lab Results   Component Value Date    ALBUMIN 3.2 (L) 04/08/2024    ALBUMIN 3.1 (L) 04/02/2024    ALBUMIN 2.9 (L) 04/01/2024     Lab Results   Component Value Date    PHOS 7.5 (H) 04/08/2024    PHOS 3.7 02/03/2024    PHOS 5.1 02/02/2024     No components found for: VITD26    Imaging:  I personally reviewed imaging available in Epic prior to the start of today's visit.    I reviewed and summarized (above) records in preparation for today's visit all pertinent notes in Epic/Media and CareEverywhere as well as any sent records.        [1]   Current Outpatient Medications:     apixaban  (ELIQUIS ) 5 mg Tab, Take 1 tablet (5 mg total) by mouth two (2) times a day., Disp: 180 tablet, Rfl: 4    blood-glucose meter,continuous (DEXCOM G6 RECEIVER) Misc, 1 each by Miscellaneous route in the morning. Dispense 1 receiver annually.  Sent to [facility]., Disp: 1 each, Rfl: 1    blood-glucose sensor (DEXCOM G7 SENSOR) Devi, Use to continuously monitor blood glucose.  Change sensor every 10 days., Disp: 3 each, Rfl: 11    CHOLECALCIFEROL, VITAMIN D3, (  VITAMIN D3 ORAL), Take 2,000 Units by mouth in the morning., Disp: , Rfl:     famotidine  (PEPCID ) 10 MG tablet, Take 1 tablet (10 mg total) by mouth daily as needed for heartburn., Disp: , Rfl:     gabapentin  (NEURONTIN ) 300 MG capsule, Take 1 capsule (300 mg total) by mouth nightly., Disp: 30 capsule, Rfl: 12    hydrOXYzine  (ATARAX ) 25 MG tablet, Take 1 tablet (25 mg total) by mouth every six (6) hours as needed for anxiety, itching or allergies., Disp: 30 tablet, Rfl: 0    insulin  aspart (NOVOLOG  FLEXPEN U-100 INSULIN ) 100 unit/mL (3 mL) injection pen, Inject 0.35 mL (35 Units total) under the skin Three (3) times a day before meals., Disp: 60 mL, Rfl: 0    insulin  degludec (TRESIBA  FLEXTOUCH U-200) 200 unit/mL (3 mL) InPn, Inject 0.3 mL (60 Units total) under the skin daily. Max dose of 100 units per day., Disp: 27 mL, Rfl: 12    multivitamin (TAB-A-VITE/THERAGRAN) per tablet, Take 1 tablet by mouth in the morning., Disp: , Rfl:     mycophenolate  (CELLCEPT ) 250 mg capsule, Take 1 capsule (250 mg total) by mouth two (2) times a day., Disp: 180 capsule, Rfl: 3    pantoprazole  (PROTONIX ) 40 MG tablet, Take 1 tablet (40 mg total) by mouth daily as needed., Disp: , Rfl:     pen needle, diabetic (TRUEPLUS PEN NEEDLE) 32 gauge x 5/32 (4 mm) Ndle, Use with insulin  up to 4 times a day as needed., Disp: 100 each, Rfl: 0    rifAXIMin  (XIFAXAN ) 550 mg Tab, Take 1 tablet (550 mg total) by mouth two (2) times a day., Disp: 180 tablet, Rfl: 3    sevelamer  (RENAGEL ) 800 MG tablet, Take 1 tablet (800 mg total) by mouth Three (3) times a day with a meal., Disp: , Rfl:     tacrolimus  (PROGRAF ) 0.5 MG capsule, Take 1 capsule (0.5 mg total) by mouth two (2) times a day., Disp: 360 capsule, Rfl: 3    tirzepatide  (MOUNJARO ) 15 mg/0.5 mL PnIj, Inject 15 mg under the skin every seven (7) days., Disp: 2 mL, Rfl: 3    ursodiol  (ACTIGALL ) 300 mg capsule, Take 2 capsules (600 mg total) by mouth two (2) times a day., Disp: 360 capsule, Rfl: 3

## 2024-04-11 ENCOUNTER — Ambulatory Visit
Admit: 2024-04-11 | Payer: BLUE CROSS/BLUE SHIELD | Attending: Student in an Organized Health Care Education/Training Program | Primary: Student in an Organized Health Care Education/Training Program

## 2024-04-13 NOTE — Unmapped (Signed)
 General Surgery Post-Operative Note    HPI: Richard [name] is a 58 y.o. old male, who is status post revision of AV fistula right arm with basilic vein transposition and drainage of post-operative seroma on 03/31/2024. He presents to the clinic today for a drain removal.     He was admitted from 03/31/2024 - 04/02/2024. He wad admitted post-operatively for pain control and drain care. During his hospitalization, endocrinology was consulted to assist with DM management. He was undergoing dialysis. There was difficulty with his tunneled catheter. He underwent exchange of catheter by VIR.     He saw Dr. Marchelle on Tuesday, at that time, the drain output was still a little to much for drain removal. He was instructed to follow up today.     Reports he is having some pain near the incision.   Denies any nausea or vomiting.  Denies any fevers or chills.    Denies any concerns with his incision.      He has a drain log with him, less than 50cc in 24 hours. Reports it has been the same color since discharge (serosanguineous).     Surgical History:  Revision of arteriovenous fistula right arm with basilic vein transposition. Drainage of postoperative seroma 03/31/2024 Dr. Marchelle    Physical Examination:   BP 150/90 (BP Site: L Arm, BP Position: Sitting)  - Pulse 81  - Wt (!) 119.3 kg (263 lb)  - SpO2 96%  - BMI 37.74 kg/m??   GEN: awake, alert, no acute distress  CARDIO: regular rate  PULM: non-labored breathing  SKIN: incision in right forearm with Nylon sutures in place, surrounding erythema and swelling, palpable thrill, palpable radial pulse; incision with skin glue in upper arm healing with less surrounding erythema, no warmth or drainage      Drain with serosanguineous output    Assessment/Plan:    Richard [name] is a 58 y.o. old male status post revision of arteriovenous fistula right arm with basilic vein transposition and drainage of postoperative seroma.    Thrill present today.   The drain has had less than 50cc/day and was thus removed in the clinic. The drain site was covered with antibiotic ointment followed by gauze and Tegaderm. Discussed he may still have some drainage from the exit site until the tract closes. Continue to monitor.     There was some mild erythema along the incision. Dr. Norman was available in the clinic at this time, I asked him to come evaluate the wound as well. Richard [name] was instructed to use antibiotic ointment over the incision daily. An outline was placed with a skin marker, encouraged him to contact the office if the erythema spreads outside of the borders.     Activity restrictions discussed.     All questions were answered. Patient knows to call with any questions or concerns. Follow-up next week for re-examination.        Edsel Oak, PA-C  General Surgery   04/11/2024

## 2024-04-15 DIAGNOSIS — Z9889 Other specified postprocedural states: Principal | ICD-10-CM

## 2024-04-15 NOTE — Unmapped (Signed)
 POST-OPERATIVE FOLLOW-UP APPOINTMENT    04/15/2024    Patient: KYROS SALZWEDEL  DOB: Jan 20, 1966  Primary Care Provider:  Jama Mayo, MD      Subjective:  s/p revision of arteriovenous fistula right arm with basilic vein transposition on 03/31/2024    MARLEE TRENTMAN is a 58 y.o. old male who returns to clinic today for routine follow up.  He was last seen in the clinic on 10/17. Started on topical antibiotics to use around the incision, which he is no longer using.  His drain was removed at the last visit. There is no recurrent seroma. . There is no erythema or induration.       Vitals:  BP 135/81 (BP Site: L Arm, BP Position: Sitting, BP Cuff Size: Large)  - Pulse 79  - Temp 36.5 ??C (97.7 ??F) (Temporal)  - SpO2 99%     Physical Exam  Musculoskeletal:      Comments: Hand is warm and well perfused.  He can range the arm.  +thrill.    Skin:     Comments: Tegaderm removed and skin beneath is fragile/peeling. Post op site is well healing with no drainage, or warmth.                 Assessment & Plan:    JUNIUS FAUCETT is a 58 y.o. old male  s/p revision of arteriovenous fistula right arm with basilic vein transposition on 03/31/2024.  He is slowly recovering.  There are no signs of infection.  Swelling is improving.  Every other suture was removed today, 3 sutures remaining.  He will return in 1 week for recheck.

## 2024-04-15 NOTE — Unmapped (Signed)
 Patient returns for follow up visit to recheck right arm. He is s/p revision of arteriovenous fistula right arm with basilic vein transposition on 03/31/2024. He still complains of pain of the right arm- 5/10.

## 2024-04-16 ENCOUNTER — Inpatient Hospital Stay
Admission: EM | Admit: 2024-04-16 | Discharge: 2024-04-18 | Disposition: A | Discharge status: Home / Self Care | Payer: BLUE CROSS/BLUE SHIELD

## 2024-04-16 ENCOUNTER — Ambulatory Visit
Admission: EM | Admit: 2024-04-16 | Discharge: 2024-04-18 | Disposition: A | Discharge status: Home / Self Care | Payer: BLUE CROSS/BLUE SHIELD

## 2024-04-16 LAB — CBC W/ AUTO DIFF
BASOPHILS ABSOLUTE COUNT: 0 10*9/L (ref 0.0–0.1)
BASOPHILS RELATIVE PERCENT: 0.5 %
EOSINOPHILS ABSOLUTE COUNT: 0.2 10*9/L (ref 0.0–0.5)
EOSINOPHILS RELATIVE PERCENT: 4.5 %
HEMATOCRIT: 33.8 % — ABNORMAL LOW (ref 39.0–48.0)
HEMOGLOBIN: 11.1 g/dL — ABNORMAL LOW (ref 12.9–16.5)
LYMPHOCYTES ABSOLUTE COUNT: 0.6 10*9/L — ABNORMAL LOW (ref 1.1–3.6)
LYMPHOCYTES RELATIVE PERCENT: 15.8 %
MEAN CORPUSCULAR HEMOGLOBIN CONC: 32.9 g/dL (ref 32.0–36.0)
MEAN CORPUSCULAR HEMOGLOBIN: 28.2 pg (ref 25.9–32.4)
MEAN CORPUSCULAR VOLUME: 85.8 fL (ref 77.6–95.7)
MEAN PLATELET VOLUME: 8.2 fL (ref 6.8–10.7)
MONOCYTES ABSOLUTE COUNT: 0.4 10*9/L (ref 0.3–0.8)
MONOCYTES RELATIVE PERCENT: 8.9 %
NEUTROPHILS ABSOLUTE COUNT: 2.9 10*9/L (ref 1.8–7.8)
NEUTROPHILS RELATIVE PERCENT: 70.3 %
NUCLEATED RED BLOOD CELLS: 0 /100{WBCs} (ref <=4)
PLATELET COUNT: 217 10*9/L (ref 150–450)
RED BLOOD CELL COUNT: 3.94 10*12/L — ABNORMAL LOW (ref 4.26–5.60)
RED CELL DISTRIBUTION WIDTH: 13.8 % (ref 12.2–15.2)
WBC ADJUSTED: 4.1 10*9/L (ref 3.6–11.2)

## 2024-04-16 LAB — COMPREHENSIVE METABOLIC PANEL
ALBUMIN: 3 g/dL — ABNORMAL LOW (ref 3.4–5.0)
ALKALINE PHOSPHATASE: 236 U/L — ABNORMAL HIGH (ref 46–116)
ALT (SGPT): <7 U/L — ABNORMAL LOW (ref 10–49)
ANION GAP: 15 mmol/L — ABNORMAL HIGH (ref 5–14)
AST (SGOT): 13 U/L (ref <=34)
BILIRUBIN TOTAL: 0.6 mg/dL (ref 0.3–1.2)
BLOOD UREA NITROGEN: 23 mg/dL (ref 9–23)
BUN / CREAT RATIO: 3
CALCIUM: 8.2 mg/dL — ABNORMAL LOW (ref 8.7–10.4)
CHLORIDE: 93 mmol/L — ABNORMAL LOW (ref 98–107)
CO2: 21.8 mmol/L (ref 20.0–31.0)
CREATININE: 8.29 mg/dL — ABNORMAL HIGH (ref 0.73–1.18)
EGFR CKD-EPI (2021) MALE: 7 mL/min/1.73m2 — ABNORMAL LOW (ref >=60)
GLUCOSE RANDOM: 658 mg/dL (ref 70–179)
POTASSIUM: 5.6 mmol/L — ABNORMAL HIGH (ref 3.4–4.8)
PROTEIN TOTAL: 7 g/dL (ref 5.7–8.2)
SODIUM: 130 mmol/L — ABNORMAL LOW (ref 135–145)

## 2024-04-16 LAB — BLOOD GAS, VENOUS
BASE EXCESS VENOUS: -3.7 — ABNORMAL LOW (ref -2.0–2.0)
HCO3 VENOUS: 21 mmol/L — ABNORMAL LOW (ref 22–27)
O2 SATURATION VENOUS: 55 % (ref 40.0–85.0)
PCO2 VENOUS: 45 mmHg (ref 40–60)
PH VENOUS: 7.31 — ABNORMAL LOW (ref 7.32–7.43)
PO2 VENOUS: 32 mmHg — ABNORMAL LOW (ref 35–40)

## 2024-04-16 LAB — MAGNESIUM: MAGNESIUM: 2.1 mg/dL (ref 1.6–2.6)

## 2024-04-16 LAB — PHOSPHORUS: PHOSPHORUS: 4.6 mg/dL (ref 2.4–5.1)

## 2024-04-16 LAB — BETA HYDROXYBUTYRATE: BETA-HYDROXYBUTYRATE: 0.09 mmol/L (ref 0.02–0.27)

## 2024-04-16 LAB — TSH: THYROID STIMULATING HORMONE: 2.042 u[IU]/mL (ref 0.550–4.780)

## 2024-04-16 MED ADMIN — HYDROmorphone (PF) (DILAUDID) injection 1 mg: 1 mg | INTRAVENOUS | Stop: 2024-04-16

## 2024-04-16 MED ADMIN — sodium chloride 0.9% (NS) bolus 1,000 mL: 1000 mL | INTRAVENOUS | @ 22:00:00 | Stop: 2024-04-16

## 2024-04-16 MED ADMIN — insulin lispro (HumaLOG) injection 12.5 Units: .1 [IU]/kg | SUBCUTANEOUS | @ 23:00:00 | Stop: 2024-04-16

## 2024-04-16 MED ADMIN — morphine injection 8 mg: 8 mg | INTRAVENOUS | @ 23:00:00 | Stop: 2024-04-16

## 2024-04-16 MED ADMIN — iohexol (OMNIPAQUE) 350 mg iodine/mL solution 100 mL: 100 mL | INTRAVENOUS | @ 22:00:00 | Stop: 2024-04-16

## 2024-04-16 NOTE — Unmapped (Signed)
 Received call from [facility] dialysis labs that blood sugar (drawn yesterday in dialysis)>700 and Na level is 123. I called him today, and he reports that he does not have a way to do a POC glucose. He does not recall if he injected himself with insulin  yesterday, but did today with 40 units of tresiba , but CGM continues to read as high. Unable to register exact glucose if BG is greater than 400.  He has not been taking glp1a. He also reports feeling funny and off. I advised reporting to ED as he may be glucotoxic and need IV insulin . He is scheduled for dialysis with us  tomorrow morning in [facility].

## 2024-04-16 NOTE — Unmapped (Signed)
 Med M History & Physical    Assessment & Plan:   Richard [name] is a 58 y.o. 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     Principal Problem:    Hyperglycemia due to diabetes mellitus (CMS-HCC)  Active Problems:    Type 2 diabetes mellitus with hyperglycemia, with long-term current use of insulin  (CMS-HCC)    Liver replaced by transplant (CMS-HCC)    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)    ESRD (end stage renal disease) on dialysis    (CMS-HCC)    (HFpEF) heart failure with preserved ejection fraction (CMS-HCC)        Active Problems    Symptomatic Hyperglycemia  Long-standing T2DM diagnosed post-liver transplant in 2015. Established with [facility] Endocrinology, current home regimen Tresiba  60u nightly and Novolog  35u with meals, Mounjaro  weekly. Presenting with severe hyperglycemia for few days prior to admission symptomatic with drowsiness, decreased appetite, dry mouth. He is anuric iso ESRD. Reports home CGM reading >400 since Monday, found to have BG >700 on weekly dialysis labs on Tuesday. He denies any recent illness or new symptoms aside from LLQ pain discussed further below. Denies missing medications at home though altered mental status may have impaired adherence. Serum BHB negative and VBG reassuring against DKA, will check serum osmolality. Given minimal PO intake today, will resume basal at reduced dose with SSI and increase to home regimen pending PO tolerance.   - s/p 1L IVF, will defer further fluids given ESRD and labs reassuring against DKA   - q4hr FSBS overnight  - glargline 30u nightly, increase to home 60u nightly when increased PO intake   - start SSI  - holding home lispro 35u TID AC pending PO intake     LLQ Pain   Reports few days of LLQ pain, abdominal exam benign. CT Ab/P notes small fat-containing umbilical hernia with adjacent intra-abdominal fat stranding, which could be contributing to pain. Additionally has known splenic artery pseudoaneurysm for which he is undergoing evaluation for embolization, though stable appearing on imaging. No bowel obstruction. Has received extensive pain regimen prior to admission, will be cautious with further IV opioids iso ESRD. Reassuringly afebrile and HDS though will obtain CRP and lactate to further assess for potential ischemia or strangulation related to hernia.   - serial abdominal exams  - bowel regimen   - CRP, lactate  - daily CBC    ESRD on iHD - Hyperkalemia   Etiology of ESRD 2/2 CNI toxicity and HTN. Now on Tues Thurs Sat HD. Last HD session 10/21, well-tolerated. Recently underwent right brachiobasilic arteriovenous fistula creation on 02/20/24 as part of a two-stage transposition. Endorsing constant pain to RUE, no overt signs of infection and palpable thrill. Likely seroma per surgery evaluation.   - sevelemer 800mg  TID   - nephrology consult in AM for HD   - daily BMP, Mg, Phos  - monitor RUE swelling and pain     Hyponatremia   Low at 130 on presentation. Normal on correction for hyperglycemia.   - daily BMP     [name] Cirrhosis s/p OLT (02/2012)  Established with [facility] Hepatology (Dr. Lennette, LOV 04/08/24). No history of rejection or biliary issues, however has developed evidence of portosystemic shunting with no fibrosis on biopsy and no elevated HVPG (see below). Multiple prior admissions for hepatic encephalopathy in the setting  of portosystemic shunting and elevated pneumonia, however more recently has not had issues with HE with effective HD. States he is no longer taking lactulose  regularly though is taking Rifaximin .   - Hepatology consult in AM   - daily tac level  - continue tacrolimus  0.5mg  BID (goal 2-4)   - continue cellcept  250mg  BID  - continue ursodiol  600mg  BID   - continue Rifaximin  BID       Chronic Problems  Hx DVT: Prior DVT in RLE and more recently left internal jugular vein. Continue Eliquis  5mg  BID  Hx of OSA: No longer uses CPAP.   CAD: NM spect in 2024 with moderate sized, mild in severity, fixed perfusion defect mid anterior, apical anterior and apical segments.     The patient's presentation is complicated by the following clinically significant conditions requiring additional evaluation and treatment: - Disorders of electrolytes, volume status, and acid/base status: - Hyperkalemia requiring further investigation, treatment, or monitoring  - overweight/obese/morbidly obese: obese POA affecting nursing needs, DME, and attention to weight based medication dosing -- Body mass index is 39.19 kg/m??.   - Hypercoagulable state requiring additional attention to DVT prophylaxis and treatment or chronic anticoagulation secondary to Prior PE/DVT  - Chronic kidney disease POA requiring further investigation, treatment, or monitoring   - Chronic heart failure POA requiring further investigation, treatment, or monitoring  - Coronary Artery Disease POA requiring further investigation, treatment, or monitoring         Issues Impacting Complexity of Management:  -The patient is at high risk for the development of complications of volume overload due to the need to provide IV hydration for suspected hypovolemia in the setting of: stage IV or greater CKD        Checklist:  Diet: Regular Diet  DVT PPx: Patient Already on Full Anticoagulation with eliquis   Code Status: Full Code  Dispo: Patient appropriate for Observation based on expectation at time of admission that period of observation will last less than two midnights    Team Contact Information:   Primary Team: MED M  Primary Resident: [name], MD  Resident's Pager: MED M Senior Resident    Chief Concern:   Hyperglycemia due to diabetes mellitus (CMS-HCC)      Subjective:   Richard [name] is a 58 y.o. 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         History obtained from patient.       HPI:  History of Present Illness  Richard [name] is a 58 year old male with diabetes and hepatic encephalopathy who presents with elevated glucose and left lower abdominal pain.    He presents with an elevated glucose level of 700, as noted by his nephrology team during dialysis. He also has left lower abdominal pain accompanied by nausea, shortness of breath, and fatigue. He describes feeling excessively sleepy since Sunday, to the point of falling asleep in the car. He describes feeling excessively sleepy since Sunday, to the point of falling asleep in the car, and is unsure of the exact cause. Denies recent illness or change in medications. No recent changes in his insulin  regimen and reports consistent medication adherence.    He underwent a revision of his right arm AV fistula on October 6th and had a follow-up on October 21st with no acute concerns or signs of infection  at the site. No use of pain medications post-procedure.    He has a history of hepatic encephalopathy but notes a reduction in episodes since starting effective dialysis. He is no longer taking lactulose , but continues to take rifaximin , which were previously both part of his treatment regimen.    He reports a decrease in appetite and an increase in fluid intake, primarily non-water beverages like soda. No changes in urination, stating he does not urinate at all. Regular bowel movements once a day without any blood in the stool.    He experiences nausea occasionally, which he associates with his medication, Mounjaro , which he injects into his abdomen. He describes the abdominal pain as a strong, dull ache that affects him throughout the day, particularly when moving.        Pertinent Surgical Hx  Past Surgical History[1]      Pertinent Family Hx  Family History[2]      Pertinent Social Hx          Allergies  Patient has no known allergies.    I was NOT able to review the Medication List with the patient or a representative. Further medication reconciliation is needed.  Prior to Admission medications   Medication Dose, Route, Frequency   apixaban  (ELIQUIS ) 5 mg Tab 5 mg, Oral, 2 times a day (standard)   blood-glucose meter,continuous (DEXCOM G6 RECEIVER) Misc 1 each, Miscellaneous, Daily, Dispense 1 receiver annually.  Sent to [facility]   blood-glucose sensor (DEXCOM G7 SENSOR) Devi Use to continuously monitor blood glucose.  Change sensor every 10 days.   CHOLECALCIFEROL, VITAMIN D3, (VITAMIN D3 ORAL) 2,000 Units, Daily (standard)   famotidine  (PEPCID ) 10 MG tablet 10 mg, Daily PRN   gabapentin  (NEURONTIN ) 300 MG capsule 300 mg, Oral, Nightly   hydrOXYzine  (ATARAX ) 25 MG tablet 25 mg, Oral, Every 6 hours PRN   insulin  aspart (NOVOLOG  FLEXPEN U-100 INSULIN ) 100 unit/mL (3 mL) injection pen 35 Units, Subcutaneous, 3 times a day (AC)   insulin  degludec (TRESIBA  FLEXTOUCH U-200) 200 unit/mL (3 mL) InPn 60 Units, Subcutaneous, Daily (standard), Max dose of 100 units per day.   multivitamin (TAB-A-VITE/THERAGRAN) per tablet 1 tablet, Daily (standard)   mycophenolate  (CELLCEPT ) 250 mg capsule 250 mg, Oral, 2 times a day (standard)   pantoprazole  (PROTONIX ) 40 MG tablet 40 mg, Daily PRN   pen needle, diabetic (TRUEPLUS PEN NEEDLE) 32 gauge x 5/32 (4 mm) Ndle Use with insulin  up to 4 times a day as needed.   rifAXIMin  (XIFAXAN ) 550 mg Tab 550 mg, Oral, 2 times a day (standard)   sevelamer  (RENAGEL ) 800 MG tablet 800 mg, 3 times a day (with meals)   tacrolimus  (PROGRAF ) 0.5 MG capsule 0.5 mg, Oral, 2 times a day   tirzepatide  (MOUNJARO ) 15 mg/0.5 mL PnIj 15 mg, Subcutaneous, Every 7 days   ursodiol  (ACTIGALL ) 300 mg capsule 600 mg, Oral, 2 times a day (standard)       Librarian, academic:  Mr. Broxterman currently has decisional capacity for healthcare decision-making and is able to designate a surrogate healthcare decision maker. Mr. Gottsch's designated healthcare decision maker(s) is/are [name] (Sister) the patient's adult sibling) as denoted by stated patient preference.    Objective:   Physical Exam:  Temp:  [36.1 ??C (96.9 ??F)-36.8 ??C (98.2 ??F)] 36.1 ??C (96.9 ??F)  Pulse:  [69-77] 77  SpO2 Pulse:  [69-77] 74  Resp:  [16-25] 20  BP: (129-171)/(78-96) 168/88  SpO2:  [94 %-100 %] 97 %  Gen: NAD, converses   Eyes: Sclera anicteric, EOMI grossly normal   HENT: Atraumatic, normocephalic; dry MM   Neck: Trachea midline  Heart: RRR  Lungs: CTAB, RLL crackles; normal WOB on room air   Abdomen: Soft, obese; minimally ttp on LLQ, no rebound or guarding   Extremities: No edema, thrill palpable on RUE  Neuro: Grossly symmetric, non-focal    Skin:  No rashes, lesions on clothed exam  Psych: Alert, oriented          [1]   Past Surgical History:  Procedure Laterality Date    CHOLECYSTECTOMY      liver transpant      LIVER TRANSPLANTATION  06/27/2011    PR ANASTOMOSIS,AV,ANY SITE Right 02/20/2024    Procedure: ARTERIOVENOUS ANASTOMOSIS, OPEN; DIRECT, ANY SITE, UPPER EXTREMITY;  Surgeon: Marchelle Kitchens, MD;  [facility];  Service: General Surgery    PR AV ANAST,UP ARM BASILIC VEIN TRANSPOSIT Right 03/31/2024    Procedure: ARTERIOVENOUS ANASTOMOSIS, OPEN; BY UPPER ARM BASILIC VEIN TRANSPOSITION;  Surgeon: Marchelle Kitchens, MD;  Location: [facility] OR [facility];  Service: General Surgery    PR AV FIST REVISE GRFT,W THROMBECTOMY Right 01/15/2023    Procedure: REVISION, ARTERIOVENOUS FISTULA W/ THROMBECTOMY, AUTOGENOUS OR NONAUTOGENOUS DIALYSIS GRAFT (SEP. PROC), LOWER EXTREMITY;  Surgeon: Marchelle Kitchens, MD;  Location: [facility] OR [facility];  Service: General Surgery    PR COLONOSCOPY FLX DX W/COLLJ SPEC WHEN PFRMD N/A 01/24/2024    Procedure: COLONOSCOPY, FLEXIBLE, PROXIMAL TO SPLENIC FLEXURE; DIAGNOSTIC, W/WO COLLECTION SPECIMEN BY BRUSH OR WASH;  Surgeon: Erick Prentice HERO, MD;  [facility]: [facility];  Service: Gastroenterology    PR COLONOSCOPY W/BIOPSY SINGLE/MULTIPLE N/A 08/13/2018    Procedure: COLONOSCOPY, FLEXIBLE, PROXIMAL TO SPLENIC FLEXURE; WITH BIOPSY, SINGLE OR MULTIPLE;  Surgeon: Thedora Alm Plain, MD;  Location: [facility];  Service: Gastroenterology    PR COLSC FLX W/RMVL OF TUMOR POLYP LESION SNARE TQ N/A 08/13/2018    Procedure: COLONOSCOPY FLEX; W/REMOV TUMOR/LES BY SNARE;  Surgeon: Thedora Alm Plain, MD;  Location: [facility];  Service: Gastroenterology    PR CREAT AV FISTULA,NON-AUTOGENOUS GRAFT Right 11/29/2022    Procedure: CREATE AV FISTULA (SEPARATE PROC); NONAUTOGENOUS GRAFT (EG, BIOLOGICAL COLLAGEN, THERMOPLASTIC GRAFT), UPPER EXTREMITY;  Surgeon: Marchelle Kitchens, MD;  Location: [facility] OR [facility];  Service: General Surgery    PR UPPER GI ENDOSCOPY,BIOPSY N/A 07/13/2015    Procedure: UGI ENDOSCOPY; WITH BIOPSY, SINGLE OR MULTIPLE;  Surgeon: Elspeth Jerilynn Reek, MD;  Location: [facility];  Service: Gastroenterology    PR UPPER GI ENDOSCOPY,BIOPSY N/A 02/13/2023    Procedure: UGI ENDOSCOPY; WITH BIOPSY, SINGLE OR MULTIPLE;  Surgeon: Minnie Krystal Claude, MD;  [facility]: [facility];  Service: Gastroenterology    PR UPPER GI ENDOSCOPY,DIAGNOSIS N/A 01/24/2024    Procedure: UGI ENDO, INCLUDE ESOPHAGUS, STOMACH, & DUODENUM &/OR JEJUNUM; DX W/WO COLLECTION SPECIMN, BY BRUSH OR WASH;  Surgeon: Erick Prentice HERO, MD;  [facility]: [facility];  Service: Gastroenterology   [2]   Family History  Problem Relation Age of Onset    Diabetes Father     Diabetes Paternal Grandfather     Liver disease Maternal Uncle     Cancer Maternal Grandmother     Melanoma Neg Hx     Basal cell carcinoma Neg Hx     Squamous cell carcinoma Neg Hx     Kidney disease Neg Hx

## 2024-04-16 NOTE — Hospital Course (Addendum)
 Symptomatic Hyperglycemia  Long-standing T2DM diagnosed post-liver transplant in 2015. Established with [facility] Endocrinology, current home regimen Tresiba  60u nightly and Novolog  SSI, Mounjaro  weekly. A1C 10.0 in July. Patient states he does not take scheduled mealtime insulin , only uses correctional and basal. Was also drinking sodas prior to admission, so adherence and nutrition thought to drive hyperglycemia on presentation. Symptomatic with drowsiness, decreased appetite, dry mouth. He is anuric iso ESRD. Reports home CGM reading >400 since Monday, found to have BG >700 on weekly dialysis labs on Tuesday. Serum BHB negative and VBG reassuring against DKA, serum osmolality elevated to 302. BG has downtrended into 200s since admission, PO intake decreased but improving. Discharged on increased basal insulin , 70 units nightly. Aspart 30u TID AC. Patient instructed to stop his correctional insulin  and start the scheduled mealtime insulin  instead (was often only using 10-15 units of short acting insulin  at home). Will need close followup with PCP to continue titrating insulin  regimen.      LLQ Pain   Reports few days of LLQ pain, abdominal exam benign. CT Ab/P notes small fat-containing umbilical hernia with adjacent intra-abdominal fat stranding, which could be contributing to pain. Clinical exam was not consistent with this. Additionally has known splenic artery pseudoaneurysm for which he is undergoing evaluation for embolization, though stable appearing on imaging. No bowel obstruction. Reassuringly afebrile and HDS. CRP 8.4, lactate 1.8.  GI cocktail ordered but patient declined. Pain improved on repeat exam with no visible discomfort on exam. Per chart review, has had fairly extensive work up for abdominal pain in the last 1-2 years without clear etiology.     ESRD on iHD - Hyperkalemia   Etiology of ESRD 2/2 CNI toxicity and HTN. Now on Tues Thurs Sat HD. Last HD session 10/21, well-tolerated. Recently underwent right brachiobasilic arteriovenous fistula creation on 02/20/24 as part of a two-stage transposition. Endorsing constant pain to RUE, no overt signs of infection and palpable thrill. Likely seroma per surgery evaluation. Received dialysis while hospitalized.     Hyponatremia   Low at 130 on presentation. Normal on correction for hyperglycemia. Has since normalized with decreased in blood glucose.     [name] Cirrhosis s/p OLT (02/2012)  Established with [facility] Hepatology (Dr. Lennette, LOV 04/08/24). No history of rejection or biliary issues, however has developed evidence of portosystemic shunting with no fibrosis on biopsy and no elevated HVPG (see below). Multiple prior admissions for hepatic encephalopathy in the setting of portosystemic shunting and elevated pneumonia, however more recently has not had issues with HE with effective HD. States he is no longer taking lactulose  regularly though is taking Rifaximin . Tac trough level: 3.2  tacrolimus  0.5mg  BID (goal 2-4) , cellcept  250mg  BID, ursodiol  600mg  BID, and Rifaximin  BID continued while inpatient.     Hx DVT: Prior DVT in RLE and more recently left internal jugular vein. Eliquis  5mg  BID continued.     Hx of OSA: No longer uses CPAP.     CAD: NM spect in 2024 with moderate sized, mild in severity, fixed perfusion defect mid anterior, apical anterior and apical segments.

## 2024-04-16 NOTE — Unmapped (Addendum)
 Pt arrives with C/O elevated blood glucose - Nephrology called pt today stating that dialysis labs from yesterday showed glucose of over >700 and Na level of 123. Told pt to come to ED pt also reporting right flank pain

## 2024-04-16 NOTE — Unmapped (Signed)
 [facility]  Emergency Department Provider Note     HPI     Richard [name] is a very pleasant 58 y.o. male with past medical history of T2DM, HFpEF, ESRD (on HD TTS), HTN, [name] cirrhosis (s/p transplant 2013) who presents with hyperglycemia. Patient reports he was called by his nephrologist earlier today because his labs showed BG > 700. Patient endorses 1 day of left lower abdominal pain with associated nausea, shortness of breath, and fatigue. He states he has had similar pain in the past. Of note, he reports having one episode of DKA many years ago requiring ICU admission. He most recently had dialysis on Tuesday. Denies fevers, chills, chest pain, or cough.      MDM     BP 148/89  - Pulse 73  - Temp 36.8 ??C (98.2 ??F) (Oral)  - Resp 18  - Wt (!) 124.8 kg (275 lb 3.2 oz)  - SpO2 94%  - BMI 39.49 kg/m??      Hemodynamically stable. On exam, patient is overall well-appearing in no acute distress. LLQ abdominal pain with mildly increased work of breathing. Regular rate and rhythm. 2+ pulses in distal extremities, no leg swelling. Ddx: Patient presenting with left lower quadrant abdominal pain, and hyperglycemia, with elevated anion gap, concerning for DKA, clear the etiology, in the setting of abdominal pain could consider diverticulitis versus small bowel obstruction, appendicitis, or complication with his liver transplant.  I have a lower suspicion of acute complication with his liver transplant as he is been taking his medications appropriately and does not have any right upper quadrant pain.  Additionally lower suspicion of acute sepsis or bacteremia as a cause for his presentation today.  Plan for imaging and IV fluids labs.  Diagnostic workup as below. Disposition pending further work-up. See progress notes below.     Orders Placed This Encounter   Procedures    Respiratory Pathogen Panel with COVID (Nasopharyngeal)    CT Abdomen Pelvis W IV Contrast    XR Chest Portable    Comprehensive Metabolic Panel CBC w/ Differential    Beta Hydroxybutyrate    Urinalysis with Microscopy with Culture Reflex    Magnesium     Phosphorus    TSH    Blood Gas, Venous    Monitor    Oxygen sat - continuous monitoring    Special Airborne/Contact Isolation Status    ECG 12 Lead    ED Admit Decision       ED Course as of 04/16/24 2246   Wed Apr 16, 2024   1754 HGB(!): 11.1   1754 WBC: 4.1   1754 Glucose, POC(!!): 594   1819 pH, Venous(!): 7.31   1825 Magnesium : 2.1   1826 Phosphorus: 4.6   1826 TSH: 2.042   1832 Anion Gap(!): 15   1832 Creatinine(!): 8.29   1832 Potassium(!): 5.6   1832 Sodium(!): 130   1907 Respiratory swab is negative   1955 Glucose, POC(!!): 538   2040 CT Abdomen Pelvis W IV Contrast     IMPRESSION     1.  No evidence of intra-abdominal acute pathology.     2.  Small fat-containing umbilical hernia with adjacent intra-abdominal fat stranding, which may be an etiology of pain.     3.  Status post liver transplantation with residual sequela of portal hypertension including extensive abdominal varices     4.  Unchanged 2.6 cm partially thrombosed splenic artery aneurysm.     2246 Was paged out to  the [facility], I have discussed with admitting team, they will come to evaluate for admission.       The case was discussed with the attending physician who is in agreement with the above assessment and plan.    - Any discussion of this patient's case/presentation between myself and consultants, admitting teams, or other team members has been documented above.  - Imaging and other studies: N/A  - External records reviewed: Outpatient progress note from 04/15/2024 for additional past medical history.  - Consideration of admission, observation, transfer, or escalation of care: Admission     ED Clinical Impression     Final diagnoses:   Diabetic ketoacidosis without coma associated with type 2 diabetes mellitus (CMS-HCC) (Primary)        Physical Exam     Vitals:    04/16/24 1716 04/16/24 1723 04/16/24 1900 04/16/24 2100   BP:  157/96 171/91 148/89   Pulse:  72 69 73   Resp:  16 20 18    Temp:  36.8 ??C (98.2 ??F)     TempSrc:  Oral     SpO2:  100% 99% 94%   Weight: (!) 124.8 kg (275 lb 3.2 oz)           Constitutional: In no acute distress.  Eyes: Conjunctivae are normal. EOMI.   HEENT: Normocephalic and atraumatic. Mucous membranes are moist.   Neck: Full active range of motion  Cardiovascular: See heart rate listed above. Normal skin perfusion.  2+ pulses in distal extremities, no lower extremity edema.  Respiratory: See respiratory rate listed above. Mildly increased work of breathing.  Gastrointestinal: Soft, non-distended,  LLQ abdominal pain.  No guarding.  Musculoskeletal: No long bone deformities.   Neurologic: Normal speech and language. No gross focal neurologic deficits are appreciated.   Skin: Skin is warm, dry and intact.  Psychiatric: Mood and affect are normal.     Past History     PAST MEDICAL HISTORY/PAST SURGICAL HISTORY:   Past Medical History[1]    Past Surgical History[2]    MEDICATIONS:     Current Facility-Administered Medications:     apixaban  (ELIQUIS ) tablet 5 mg, 5 mg, Oral, BID, Keshawna Dix Peter Sam, DO, 5 mg at 04/16/24 2214    mycophenolate  (CELLCEPT ) capsule 250 mg, 250 mg, Oral, BID, Tywan Siever Peter Sam, DO, 250 mg at 04/16/24 2214    rifAXIMin  (XIFAXAN ) tablet 550 mg, 550 mg, Oral, 3xd Meals, Danelly Hassinger Peter Sam, DO, 550 mg at 04/16/24 2214    sevelamer  (RENVELA ) tablet 800 mg, 800 mg, Oral, 3xd Meals, Niv Darley Peter Sam, DO, 800 mg at 04/16/24 2214    tacrolimus  (PROGRAF ) capsule 0.5 mg, 0.5 mg, Oral, BID, Khyler Urda Peter Sam, DO    ursodiol  (ACTIGALL ) capsule 600 mg, 600 mg, Oral, BID, Telissa Palmisano Peter Sam, DO, 600 mg at 04/16/24 2214    Current Outpatient Medications:     apixaban  (ELIQUIS ) 5 mg Tab, Take 1 tablet (5 mg total) by mouth two (2) times a day., Disp: 180 tablet, Rfl: 4    blood-glucose meter,continuous (DEXCOM G6 RECEIVER) Misc, 1 each by Miscellaneous route in the morning. Dispense 1 receiver annually.  Sent to [facility]., Disp: 1 each, Rfl: 1    blood-glucose sensor (DEXCOM G7 SENSOR) Devi, Use to continuously monitor blood glucose.  Change sensor every 10 days., Disp: 3 each, Rfl: 11    CHOLECALCIFEROL, VITAMIN D3, (VITAMIN D3 ORAL), Take 2,000 Units by mouth in the morning., Disp: , Rfl:  famotidine  (PEPCID ) 10 MG tablet, Take 1 tablet (10 mg total) by mouth daily as needed for heartburn., Disp: , Rfl:     gabapentin  (NEURONTIN ) 300 MG capsule, Take 1 capsule (300 mg total) by mouth nightly., Disp: 30 capsule, Rfl: 12    hydrOXYzine  (ATARAX ) 25 MG tablet, Take 1 tablet (25 mg total) by mouth every six (6) hours as needed for anxiety, itching or allergies., Disp: 30 tablet, Rfl: 0    insulin  aspart (NOVOLOG  FLEXPEN U-100 INSULIN ) 100 unit/mL (3 mL) injection pen, Inject 0.35 mL (35 Units total) under the skin Three (3) times a day before meals., Disp: 60 mL, Rfl: 0    insulin  degludec (TRESIBA  FLEXTOUCH U-200) 200 unit/mL (3 mL) InPn, Inject 0.3 mL (60 Units total) under the skin daily. Max dose of 100 units per day., Disp: 27 mL, Rfl: 12    multivitamin (TAB-A-VITE/THERAGRAN) per tablet, Take 1 tablet by mouth in the morning., Disp: , Rfl:     mycophenolate  (CELLCEPT ) 250 mg capsule, Take 1 capsule (250 mg total) by mouth two (2) times a day., Disp: 180 capsule, Rfl: 3    pantoprazole  (PROTONIX ) 40 MG tablet, Take 1 tablet (40 mg total) by mouth daily as needed., Disp: , Rfl:     pen needle, diabetic (TRUEPLUS PEN NEEDLE) 32 gauge x 5/32 (4 mm) Ndle, Use with insulin  up to 4 times a day as needed., Disp: 100 each, Rfl: 0    rifAXIMin  (XIFAXAN ) 550 mg Tab, Take 1 tablet (550 mg total) by mouth two (2) times a day., Disp: 180 tablet, Rfl: 3    sevelamer  (RENAGEL ) 800 MG tablet, Take 1 tablet (800 mg total) by mouth Three (3) times a day with a meal., Disp: , Rfl:     tacrolimus  (PROGRAF ) 0.5 MG capsule, Take 1 capsule (0.5 mg total) by mouth two (2) times a day., Disp: 360 capsule, Rfl: 3    tirzepatide  (MOUNJARO ) 15 mg/0.5 mL PnIj, Inject 15 mg under the skin every seven (7) days., Disp: 2 mL, Rfl: 3    ursodiol  (ACTIGALL ) 300 mg capsule, Take 2 capsules (600 mg total) by mouth two (2) times a day., Disp: 360 capsule, Rfl: 3    ALLERGIES:   Patient has no known allergies.    SOCIAL HISTORY:   Social History     Tobacco Use    Smoking status: Never     Passive exposure: Past    Smokeless tobacco: Never   Substance Use Topics    Alcohol use: No       FAMILY HISTORY:  Family History[3]      Radiology     XR Chest Portable   Final Result      Clear lungs.      CT Abdomen Pelvis W IV Contrast   Final Result           Laboratory Data     Lab Results   Component Value Date    WBC 4.1 04/16/2024    HGB 11.1 (L) 04/16/2024    HCT 33.8 (L) 04/16/2024    PLT 217 04/16/2024       Lab Results   Component Value Date    NA 130 (L) 04/16/2024    K 5.6 (H) 04/16/2024    CL 93 (L) 04/16/2024    CO2 21.8 04/16/2024    BUN 23 04/16/2024    CREATININE 8.29 (H) 04/16/2024    GLU 658 (HH) 04/16/2024    CALCIUM  8.2 (L) 04/16/2024  MG 2.1 04/16/2024    PHOS 4.6 04/16/2024       Lab Results   Component Value Date    BILITOT 0.6 04/16/2024    BILIDIR 0.40 (H) 04/08/2024    PROT 7.0 04/16/2024    ALBUMIN 3.0 (L) 04/16/2024    ALT <7 (L) 04/16/2024    AST 13 04/16/2024    ALKPHOS 236 (H) 04/16/2024    GGT 374 (H) 04/08/2024       Lab Results   Component Value Date    LABPROT 26.6 01/22/2012    INR 1.63 02/03/2024    APTT 290.0 (HH) 01/19/2024       Morene Ates, DO  PGY3 EM    Portions of this record have been created using Dragon dictation software. Dictation errors have been sought, but may not have been identified and corrected.    Documentation assistance was provided by Max Caza, Scribe on April 16, 2024 at 5:57 PM for Morene Ates, DO.    Documentation assistance was provided by the scribe in my presence.  The documentation recorded by the scribe has been reviewed by me and accurately reflects the services I personally performed. Edits were made as necessary.    Morene Ates, DO         [1]   Past Medical History:  Diagnosis Date    CHF (congestive heart failure) (CMS-HCC)     COVID-19 07/18/2019    Diabetes mellitus (CMS-HCC)     Family history of malignant neoplasm of prostate 06/23/2015    Heart disease     Heart murmur     Hepatic cirrhosis    (CMS-HCC) 06/23/2015    Overview:  Secondary to NASH; followed by Dr. Lindaann, GI.  Last Assessment & Plan:  Relevant Hx: Course: Daily Update: Today's Plan:     Hypertension     Hypogonadism in male 02/12/2014    Kidney stone     Liver cirrhosis secondary to NASH (nonalcoholic steatohepatitis) (CMS-HCC)     s/p liver transplant 2013    Obesity    [2]   Past Surgical History:  Procedure Laterality Date    CHOLECYSTECTOMY      liver transpant      LIVER TRANSPLANTATION  06/27/2011    PR ANASTOMOSIS,AV,ANY SITE Right 02/20/2024    Procedure: ARTERIOVENOUS ANASTOMOSIS, OPEN; DIRECT, ANY SITE, UPPER EXTREMITY;  Surgeon: Marchelle Kitchens, MD;  Location: [facility];  Service: General Surgery    PR AV ANAST,UP ARM BASILIC VEIN TRANSPOSIT Right 03/31/2024    Procedure: ARTERIOVENOUS ANASTOMOSIS, OPEN; BY UPPER ARM BASILIC VEIN TRANSPOSITION;  Surgeon: Marchelle Kitchens, MD;  Location: [facility];  Service: General Surgery    PR AV FIST REVISE GRFT,W THROMBECTOMY Right 01/15/2023    Procedure: REVISION, ARTERIOVENOUS FISTULA W/ THROMBECTOMY, AUTOGENOUS OR NONAUTOGENOUS DIALYSIS GRAFT (SEP. PROC), LOWER EXTREMITY;  Surgeon: Marchelle Kitchens, MD;  Location: [facility] OR [facility];  Service: General Surgery    PR COLONOSCOPY FLX DX W/COLLJ SPEC WHEN PFRMD N/A 01/24/2024    Procedure: COLONOSCOPY, FLEXIBLE, PROXIMAL TO SPLENIC FLEXURE; DIAGNOSTIC, W/WO COLLECTION SPECIMEN BY BRUSH OR WASH;  Surgeon: Erick Prentice HERO, MD;  Location: [facility];  Service: Gastroenterology    PR COLONOSCOPY W/BIOPSY SINGLE/MULTIPLE N/A 08/13/2018    Procedure: COLONOSCOPY, FLEXIBLE, PROXIMAL TO SPLENIC FLEXURE; WITH BIOPSY, SINGLE OR MULTIPLE;  Surgeon: Thedora Alm Plain, MD;  Location: [facility];  Service: Gastroenterology    PR COLSC FLX W/RMVL OF TUMOR POLYP LESION SNARE  TQ N/A 08/13/2018    Procedure: COLONOSCOPY FLEX; W/REMOV TUMOR/LES BY SNARE;  Surgeon: Thedora Alm Plain, MD;  Location: [facility];  Service: Gastroenterology    PR CREAT AV FISTULA,NON-AUTOGENOUS GRAFT Right 11/29/2022    Procedure: CREATE AV FISTULA (SEPARATE PROC); NONAUTOGENOUS GRAFT (EG, BIOLOGICAL COLLAGEN, THERMOPLASTIC GRAFT), UPPER EXTREMITY;  Surgeon: Marchelle Kitchens, MD;  [facility]: [facility] OR [facility];  Service: General Surgery    PR UPPER GI ENDOSCOPY,BIOPSY N/A 07/13/2015    Procedure: UGI ENDOSCOPY; WITH BIOPSY, SINGLE OR MULTIPLE;  Surgeon: Elspeth Jerilynn Reek, MD;  Location: [facility];  Service: Gastroenterology    PR UPPER GI ENDOSCOPY,BIOPSY N/A 02/13/2023    Procedure: UGI ENDOSCOPY; WITH BIOPSY, SINGLE OR MULTIPLE;  Surgeon: Minnie Krystal Claude, MD;  [facility]: [facility];  Service: Gastroenterology    PR UPPER GI ENDOSCOPY,DIAGNOSIS N/A 01/24/2024    Procedure: UGI ENDO, INCLUDE ESOPHAGUS, STOMACH, & DUODENUM &/OR JEJUNUM; DX W/WO COLLECTION SPECIMN, BY BRUSH OR WASH;  Surgeon: Erick Prentice HERO, MD;  Location: [facility];  Service: Gastroenterology   [3]   Family History  Problem Relation Age of Onset    Diabetes Father     Diabetes Paternal Grandfather     Liver disease Maternal Uncle     Cancer Maternal Grandmother     Melanoma Neg Hx     Basal cell carcinoma Neg Hx     Squamous cell carcinoma Neg Hx     Kidney disease Neg Hx         Alain Morene Maude Sheppard, DO  Resident  04/16/24 2246

## 2024-04-17 LAB — BASIC METABOLIC PANEL
ANION GAP: 15 mmol/L — ABNORMAL HIGH (ref 5–14)
BLOOD UREA NITROGEN: 26 mg/dL — ABNORMAL HIGH (ref 9–23)
BUN / CREAT RATIO: 3
CALCIUM: 8.2 mg/dL — ABNORMAL LOW (ref 8.7–10.4)
CHLORIDE: 98 mmol/L (ref 98–107)
CO2: 22.7 mmol/L (ref 20.0–31.0)
CREATININE: 9.22 mg/dL — ABNORMAL HIGH (ref 0.73–1.18)
EGFR CKD-EPI (2021) MALE: 6 mL/min/1.73m2 — ABNORMAL LOW (ref >=60)
GLUCOSE RANDOM: 352 mg/dL — ABNORMAL HIGH (ref 70–179)
POTASSIUM: 5.4 mmol/L — ABNORMAL HIGH (ref 3.4–4.8)
SODIUM: 136 mmol/L (ref 135–145)

## 2024-04-17 LAB — CBC
HEMATOCRIT: 32.6 % — ABNORMAL LOW (ref 39.0–48.0)
HEMOGLOBIN: 10.9 g/dL — ABNORMAL LOW (ref 12.9–16.5)
MEAN CORPUSCULAR HEMOGLOBIN CONC: 33.4 g/dL (ref 32.0–36.0)
MEAN CORPUSCULAR HEMOGLOBIN: 28.2 pg (ref 25.9–32.4)
MEAN CORPUSCULAR VOLUME: 84.5 fL (ref 77.6–95.7)
MEAN PLATELET VOLUME: 8 fL (ref 6.8–10.7)
PLATELET COUNT: 203 10*9/L (ref 150–450)
RED BLOOD CELL COUNT: 3.86 10*12/L — ABNORMAL LOW (ref 4.26–5.60)
RED CELL DISTRIBUTION WIDTH: 13.9 % (ref 12.2–15.2)
WBC ADJUSTED: 5.1 10*9/L (ref 3.6–11.2)

## 2024-04-17 LAB — OSMOLALITY, SERUM: OSMOLALITY MEASURED: 303 mosm/kg — ABNORMAL HIGH (ref 275–295)

## 2024-04-17 LAB — C-REACTIVE PROTEIN: C-REACTIVE PROTEIN: 8.4 mg/L (ref <=10.0)

## 2024-04-17 LAB — LACTATE, VENOUS, WHOLE BLOOD: LACTATE BLOOD VENOUS: 1.8 mmol/L (ref 0.5–1.8)

## 2024-04-17 LAB — MAGNESIUM: MAGNESIUM: 2.1 mg/dL (ref 1.6–2.6)

## 2024-04-17 LAB — TACROLIMUS LEVEL, TROUGH: TACROLIMUS, TROUGH: 3.2 ng/mL — ABNORMAL LOW (ref 5.0–15.0)

## 2024-04-17 LAB — PHOSPHORUS: PHOSPHORUS: 5.7 mg/dL — ABNORMAL HIGH (ref 2.4–5.1)

## 2024-04-17 MED ADMIN — ursodiol (ACTIGALL) capsule 600 mg: 600 mg | ORAL | @ 14:00:00

## 2024-04-17 MED ADMIN — HYDROmorphone (PF) (DILAUDID) injection 1 mg: 1 mg | INTRAVENOUS | @ 04:00:00 | Stop: 2024-04-16

## 2024-04-17 MED ADMIN — HYDROmorphone (PF) (DILAUDID) injection 1 mg: 1 mg | INTRAVENOUS | @ 01:00:00 | Stop: 2024-04-16

## 2024-04-17 MED ADMIN — rifAXIMin (XIFAXAN) tablet 550 mg: 550 mg | ORAL | @ 02:00:00 | Stop: 2024-04-19

## 2024-04-17 MED ADMIN — apixaban (ELIQUIS) tablet 5 mg: 5 mg | ORAL | @ 14:00:00

## 2024-04-17 MED ADMIN — gabapentin (NEURONTIN) capsule 300 mg: 300 mg | ORAL | @ 04:00:00

## 2024-04-17 MED ADMIN — cholecalciferol (vitamin D3 25 mcg (1,000 units)) tablet 50 mcg: 50 ug | ORAL | @ 14:00:00

## 2024-04-17 MED ADMIN — tacrolimus (PROGRAF) capsule 0.5 mg: .5 mg | ORAL | @ 04:00:00

## 2024-04-17 MED ADMIN — gentamicin-sodium citrate lock solution in NS: 2.1 mL | @ 22:00:00

## 2024-04-17 MED ADMIN — calcitriol (ROCALTROL) capsule 0.25 mcg: .25 ug | ORAL | @ 17:00:00

## 2024-04-17 MED ADMIN — sevelamer (RENVELA) tablet 800 mg: 800 mg | ORAL | @ 12:00:00

## 2024-04-17 MED ADMIN — heparin (porcine) 1000 unit/mL injection 4,000 Units: 4000 [IU] | @ 17:00:00

## 2024-04-17 MED ADMIN — rifAXIMin (XIFAXAN) tablet 550 mg: 550 mg | ORAL | @ 16:00:00 | Stop: 2025-04-17

## 2024-04-17 MED ADMIN — insulin lispro (HumaLOG) injection CORRECTIONAL 0-20 Units: 0-20 [IU] | SUBCUTANEOUS | @ 16:00:00

## 2024-04-17 MED ADMIN — insulin NPH (HumuLIN,NovoLIN) injection 15 Units: 15 [IU] | SUBCUTANEOUS | @ 16:00:00 | Stop: 2024-04-17

## 2024-04-17 MED ADMIN — apixaban (ELIQUIS) tablet 5 mg: 5 mg | ORAL | @ 02:00:00

## 2024-04-17 MED ADMIN — oxyCODONE (ROXICODONE) immediate release tablet 5 mg: 5 mg | ORAL | @ 06:00:00 | Stop: 2024-04-17

## 2024-04-17 MED ADMIN — insulin lispro (HumaLOG) injection CORRECTIONAL 0-20 Units: 0-20 [IU] | SUBCUTANEOUS | @ 12:00:00

## 2024-04-17 MED ADMIN — insulin glargine (LANTUS) injection BASAL 30 Units: 30 [IU] | SUBCUTANEOUS | @ 06:00:00 | Stop: 2024-04-17

## 2024-04-17 MED ADMIN — mycophenolate (CELLCEPT) capsule 250 mg: 250 mg | ORAL | @ 14:00:00

## 2024-04-17 MED ADMIN — melatonin tablet 3 mg: 3 mg | ORAL | @ 10:00:00

## 2024-04-17 MED ADMIN — hydrOXYzine (ATARAX) tablet 25 mg: 25 mg | ORAL | @ 09:00:00 | Stop: 2024-04-17

## 2024-04-17 MED ADMIN — ursodiol (ACTIGALL) capsule 600 mg: 600 mg | ORAL | @ 02:00:00

## 2024-04-17 MED ADMIN — insulin lispro (HumaLOG) injection 30 Units: 30 [IU] | SUBCUTANEOUS | @ 23:00:00

## 2024-04-17 MED ADMIN — sevelamer (RENVELA) tablet 800 mg: 800 mg | ORAL | @ 02:00:00

## 2024-04-17 MED ADMIN — tacrolimus (PROGRAF) capsule 0.5 mg: .5 mg | ORAL | @ 12:00:00

## 2024-04-17 MED ADMIN — heparin (porcine) 1000 unit/mL injection 4,000 Units: 4000 [IU] | @ 20:00:00

## 2024-04-17 MED ADMIN — sevelamer (RENVELA) tablet 800 mg: 800 mg | ORAL | @ 22:00:00

## 2024-04-17 MED ADMIN — mycophenolate (CELLCEPT) capsule 250 mg: 250 mg | ORAL | @ 02:00:00

## 2024-04-17 MED ADMIN — multivitamins, therapeutic with minerals tablet 1 tablet: 1 | ORAL | @ 14:00:00

## 2024-04-17 MED ADMIN — insulin lispro (HumaLOG) injection CORRECTIONAL 0-20 Units: 0-20 [IU] | SUBCUTANEOUS | @ 22:00:00

## 2024-04-17 MED ADMIN — HYDROmorphone (PF) (DILAUDID) injection 0.25 mg: .25 mg | INTRAVENOUS | @ 09:00:00 | Stop: 2024-04-17

## 2024-04-17 NOTE — Unmapped (Signed)
 Shift Summary  Correctional insulin  administered and provider notified twice for persistent hyperglycemia.  Hemodialysis performed with adjustments in blood flow rate and monitoring of blood volume and pressures throughout the session.  Calcitriol  and heparin  (porcine) administered in Dialysis Hbr during dialysis to support treatment and line patency.  Pain decreased from 8 to 0 early in the shift and remained at 0 for the remainder of the day.  Remained alert and oriented, with stable hematocrit and no documented acute complications during the shift.  Pt currently still in HD    Optimal Coping: Remained alert and oriented throughout the shift, with psychosocial status documented as within defined limits early in the day.    Fluid and Electrolyte Balance with Absence of Ketosis: Blood glucose remained elevated throughout the shift, with correctional insulin  administered and provider notified as directed; no documentation of ketosis present in available data.    Electrolyte Balance: Hemodialysis was performed with dialysate containing 2 K+ and 2.5 Ca+, and blood flow rates were adjusted during the session; no acute electrolyte disturbances documented in available data.    Fluid Balance: Blood volume change and venous/arterial pressures were monitored during dialysis, with gradual adjustments noted; fluid intake and meal consumption were documented as adequate early in the shift.    Absence of Anemia Signs and Symptoms: Hematocrit values increased slightly during dialysis and remained stable, with no documentation of anemia symptoms in available data.    Problem: Diabetic Ketoacidosis  Goal: Optimal Coping  04/17/2024 1712 by Susie Lukes, RN  Outcome: Shift Focus  04/17/2024 1711 by Susie Lukes, RN  Outcome: Shift Focus  Goal: Fluid and Electrolyte Balance with Absence of Ketosis  04/17/2024 1712 by Susie Lukes, RN  Outcome: Shift Focus  04/17/2024 1711 by Susie Lukes, RN  Outcome: Shift Focus Problem: Chronic Kidney Disease  Goal: Electrolyte Balance  Outcome: Shift Focus  Goal: Fluid Balance  Outcome: Shift Focus  Goal: Absence of Anemia Signs and Symptoms  Outcome: Shift Focus

## 2024-04-17 NOTE — Progress Notes (Signed)
 Internal Medicine (MEDM) Progress Note    Assessment & Plan:   Richard [name] is a 58 y.o. male who is presenting to [facility] with Hyperglycemia due to diabetes mellitus (CMS-HCC), in the setting of the following pertinent/contributing co-morbidities: [name] cirrhosis s/p transplant (02/2012), T2DM, HFpEF, ESRD due to CNI toxicity and HTN (T/Th/Sat iHD), hypertension, and dyslipidemia  .    Principal Problem:    Hyperglycemia due to diabetes mellitus (CMS-HCC)  Active Problems:    Type 2 diabetes mellitus with hyperglycemia, with long-term current use of insulin  (CMS-HCC)    Liver replaced by transplant (CMS-HCC)    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)    ESRD (end stage renal disease) on dialysis    (CMS-HCC)    (HFpEF) heart failure with preserved ejection fraction (CMS-HCC)        Active Problems    Symptomatic Hyperglycemia  Long-standing T2DM diagnosed post-liver transplant in 2015. Established with [facility]ology, current home regimen Tresiba  60u nightly and Novolog  SSI, Mounjaro  weekly. Patient states he does not take scheduled mealtime insulin , only uses correctional and basal. Presenting with severe hyperglycemia for few days prior to admission symptomatic with drowsiness, decreased appetite, dry mouth. He is anuric iso ESRD. Reports home CGM reading >400 since Monday, found to have BG >700 on weekly dialysis labs on Tuesday. Serum BHB negative and VBG reassuring against DKA, serum osmolality elevated to 302. BG has downtrended into 300s since admission, PO intake minimal.   - increase to home 60u glargine nightly    - SSI  - lispro 30u TID AC     LLQ Pain   Reports few days of LLQ pain, abdominal exam benign. CT Ab/P notes small fat-containing umbilical hernia with adjacent intra-abdominal fat stranding, which could be contributing to pain. Additionally has known splenic artery pseudoaneurysm for which he is undergoing evaluation for embolization, though stable appearing on imaging. No bowel obstruction. Has received extensive pain regimen prior to admission, will be cautious with further IV opioids iso ESRD. Reassuringly afebrile and HDS. CRP 8.4, lactate 1.8. Will   - GI cocktail ordered   - serial abdominal exams  - senna PRN, patient does not want scheduled  - daily CBC     ESRD on iHD - Hyperkalemia   Etiology of ESRD 2/2 CNI toxicity and HTN. Now on Tues Thurs Sat HD. Last HD session 10/21, well-tolerated. Recently underwent right brachiobasilic arteriovenous fistula creation on 02/20/24 as part of a two-stage transposition. Endorsing constant pain to RUE, no overt signs of infection and palpable thrill. Likely seroma per surgery evaluation.   - sevelemer 800mg  TID   - nephrology consulted for HD -> patient in HD now  - daily BMP, Mg, Phos  - monitor RUE swelling and pain      Hyponatremia   Low at 130 on presentation. Normal on correction for hyperglycemia. Has since normalized with decreased in blood glucose.  - daily BMP      [name] Cirrhosis s/p OLT (02/2012)  Established with [facility]ology (Dr. Lennette, LOV 04/08/24). No history of rejection or biliary issues, however has developed evidence of portosystemic shunting with no fibrosis on biopsy and no elevated HVPG (see below). Multiple prior admissions for hepatic encephalopathy in the setting of portosystemic shunting and elevated pneumonia, however more recently has not had issues with HE with effective HD. States he is no longer taking lactulose  regularly though is taking Rifaximin .   - tac trough level: 3.2  - continue tacrolimus  0.5mg  BID (  goal 2-4)   - continue cellcept  250mg  BID  - continue ursodiol  600mg  BID   - continue Rifaximin  BID         The patient's presentation is complicated by the following clinically significant conditions requiring additional evaluation and treatment: - overweight/obese/morbidly obese: obese POA affecting nursing needs, DME, and attention to weight based medication dosing -- Body mass index is 39.19 kg/m??.   - Hypercoagulable state requiring additional attention to DVT prophylaxis and treatment or chronic anticoagulation secondary to Prior PE/DVT        Chronic Problems    Hx DVT: Prior DVT in RLE and more recently left internal jugular vein. Continue Eliquis  5mg  BID  Hx of OSA: No longer uses CPAP.   CAD: NM spect in 2024 with moderate sized, mild in severity, fixed perfusion defect mid anterior, apical anterior and apical segments.         Daily Checklist:  Diet: Regular Diet  DVT PPx: home eliquis   Electrolytes: No Repletion Needed  Code Status: Full Code  Dispo: home, EDD 10/24    Team Contact Information:   Primary Team: Internal Medicine (MEDM)  Primary Resident: Chiquita Mace, MD, MD  Resident's Pager: [phone] [facility] Med[facility] Intern)    Interval History:   No acute events overnight. Patient has LUQ pain this AM, says it was not helped by administration of oxy 5 or 0.25 dilaudid  that he receieved overnight. He has decreased appetite, saying that over the past few days he has not been eating but has been consuming beverages such as regular soda. He was able to eat part of a breakfast sandwich this AM. No emesis. Denies CP, SOB, N/V, C/D, fevers, chills, HA.     Per patient, he has not missed any insulin  doses in the recent past prior to presenting. He denies recent diarrhea, stomach cramping, cough, rhinorrhea, sneezing.     Says he knows he is above his dry weight and is due for dialysis today.    All other systems were reviewed and are negative except as noted in the HPI    Objective:   Temp:  [36.1 ??C (96.9 ??F)-36.9 ??C (98.5 ??F)] 36.6 ??C (97.8 ??F)  Pulse:  [65-86] 75  SpO2 Pulse:  [69-77] 74  Resp:  [16-25] 17  BP: (107-190)/(56-119) 129/73  SpO2:  [94 %-99 %] 97 %,   Intake/Output Summary (Last 24 hours) at 04/17/2024 1731  Last data filed at 04/17/2024 1715  Gross per 24 hour   Intake 720 ml   Output 4920 ml   Net -4200 ml   ,   Wt Readings from Last 3 Encounters:   04/17/24 (!) 123.9 kg (273 lb 1.6 oz)   04/11/24 (!) 119.3 kg (263 lb)   04/08/24 (!) 119 kg (262 lb 6.4 oz)       Gen: NAD, converses   HENT: atraumatic, normocephalic  Heart: RRR  Lungs: CTAB, no crackles or wheezes, no increased WOB  Abdomen: soft, TTP in LUQ, patient pushed my hand away while palpating. Nontender to palpation in LLQ and R side of abdomen.   Extremities: No edema

## 2024-04-17 NOTE — Unmapped (Signed)
 Shift Summary  Severe pain persisted throughout the shift despite multiple pharmacologic and non-pharmacologic interventions, with no sustained reduction in pain scores.  Patient received Atarax  and melatonin during shift.   Multiple safety and fall prevention measures were maintained, and no new injuries or complications were documented.  Skin integrity was preserved with frequent repositioning and appropriate dressing care, and no skin breakdown was noted.  Overall, the shift was marked by persistent pain, stable skin and safety status, and successful handoff for continued care.    Absence of Hospital-Acquired Illness or Injury: No new hospital-acquired injuries or illnesses documented during the shift, and safety interventions such as fall reduction and bed positioning were consistently maintained.    Optimal Comfort and Wellbeing: Pain remained severe and persistent throughout the shift despite multiple interventions including rest, emotional support, environmental modifications, and administration of oxyCODONE  and HYDROmorphone ; pain scores fluctuated but did not show sustained improvement.    Readiness for Transition of Care: Report was given to the floor and patient was prepared for transfer, with discharge planning information updated and support systems identified.    Skin Health and Integrity: Skin remained within defined limits and dressing was clean, dry, and intact; frequent weight shifts and supine positioning were encouraged to support skin integrity, and Braden score was stable at 17.

## 2024-04-17 NOTE — Unmapped (Signed)
 Nephrology ESKD Consult Note    Requesting provider: [name], MD  Service requesting consult: [facility] ([facility])  Reason for consult: Evaluation for dialysis needs    Outpatient dialysis unit:   [facility]  [address]  [place] [address]     Assessment/Recommendations: Richard [name] is a 58 y.o. male with a past medical history notable for ESKD on in-center hemodialysis being evaluated at [facility] for Hyperglycemia.    # ESKD:   Outpatient HD Dialysis Rx and medications    Dialysis schedule: TTS   Time: 4  hrs Temp: 36.5C   Dialyzer: Optiflux 180 NRE Heparin : Receiving heparin  4000 unit bolus and mid-treatment 4000 unit bolus   Dialysate Bath: 3 K, 2.5Ca, Na 137, Bicarbonate 35   EDW: 116 kg      - OP dialysis prescription reviewed and ordered  - Indication for acute dialysis?: Yes, hyperkalemia  - We will perform HD starting today and will otherwise attempt to continue pt's outpatient schedule if possible.   - Access: Right IJ tunneled catheter ; Vascular Access functioning well- no indication for intervention.  - Hepatitis status: Hepatitis B surface antigen negative on 04/15/24.    # Volume / Hypertension:  - Volume: Will attempt to achieve dry weight if tolerated  - Will adjust daily as needed    # Acid-Base / Electrolytes:  - Will evaluate acid-base status and electrolyte balance daily and adjust dialysate as needed.  Lab Results   Component Value Date    K 5.4 (H) 04/17/2024    CO2 22.7 04/17/2024       # Anemia of Chronic Kidney Disease:  - ESA: Patient not on ESA    - IV Iron/ PO oral iron: not on Iron  - Outpatient meds reviewed and ordered   Lab Results   Component Value Date    HGB 10.9 (L) 04/17/2024       # Secondary Hyperparathyroidism/Hyperphosphatemia:  - Phosphate binders : Renvella 800mg  TID w Meals  - Activated Vitamin D Analogs:  , Calcitriol  0.25 with HD  - Calcimimetics: none  - Outpatient meds reviewed and ordered   Lab Results   Component Value Date    PHOS 5.7 (H) 04/17/2024    CALCIUM  8.2 (L) 04/17/2024       # Hyperglycemia   - Evaluation and management per primary team  - No changes to management from a nephrology standpoint at this time    Richard JONETTA Cramp, MD  04/17/2024 3:17 PM   Medical decision-making for 04/17/24  Findings / Data     Patient has: []  acute illness w/systemic sxs  [mod]  []  two or more stable chronic illnesses [mod]  []  one chronic illness with acute exacerbation [mod]  []  acute complicated illness  [mod]  []  Undiagnosed new problem with uncertain prognosis  [mod] [x]  illness posing risk to life or bodily function (ex. AKI)  [high]  []  chronic illness with severe exacerbation/progression  [high]  []  chronic illness with severe side effects of treatment  [high] ESKD on RRT Probs At least 2:  Probs, Data, Risk   I reviewed: []  primary team note  []  consultant note(s)  [x]  external records [x]  chemistry results  [x]  CBC results  []  blood gas results  []  Other []  procedure/op note(s)   []  radiology report(s)  []  micro result(s)  []  w/ independent historian(s) Outside dialysis prescription reviewed , K and Hb addressed above >=3 Data Review (2 of  3)    I independently interpreted: []  Urine Sediment  []  Renal US  []  CXR Images  []  CT Images  []  Other []  EKG Tracing  Any     I discussed: []  Pathology results w/ QHPs(s) from other specialties  []  Procedural findings w/ QHPs(s) from other specialties []  Imaging w/ QHP(s) from other specialties  [x]  Treatment plan w/ QHP(s) from other specialties Plan discussed with primary team Any     Mgm't requires: []  Prescription drug(s)  [mod]  []  Kidney biopsy  [mod]  []  Central line placement  [mod] []  High risk medication use and/or intensive toxicity monitoring [high]  [x]  Renal replacement therapy [high]  []  High risk kidney biopsy  [high]  []  Escalation of care  [high]  []  High risk central line placement  [high] RRT: High risk of complications from RRT requiring intensive monitoring Risk _____________________________________________________________________________________    History of Present Illness: Richard [name] is a 58 y.o. male with ESKD on dialysis as well as DM HTN being evaluated at [facility] for Hyperglycemia who is seen in consultation at the request of [name], MD and [facility] Teaching (MDM). Nephrology has been consulted for evaluation of dialysis needs in the setting of end-stage kidney disease.    Patient presenting to the ED for Hyperglycemia. Patient does not know how he became hyperglycemic was symptomatic with drowsiness. Blood sugars were in the 700s during dialysis. Today is without symptoms doing better with blood sugars. Denies any issues with HD in Unit but has been having difficulty with keeping the fluid off.     INPATIENT MEDICATIONS:Current Medications[1]    OUTPATIENT MEDICATIONS:  Prior to Admission medications   Medication Dose, Route, Frequency   apixaban  (ELIQUIS ) 5 mg Tab 5 mg, Oral, 2 times a day (standard)   blood-glucose meter,continuous (DEXCOM G6 RECEIVER) Misc 1 each, Miscellaneous, Daily, Dispense 1 receiver annually.  Sent to [facility]   blood-glucose sensor (DEXCOM G7 SENSOR) Devi Use to continuously monitor blood glucose.  Change sensor every 10 days.   CHOLECALCIFEROL, VITAMIN D3, (VITAMIN D3 ORAL) 2,000 Units, Daily (standard)   famotidine  (PEPCID ) 10 MG tablet 10 mg, Daily PRN   gabapentin  (NEURONTIN ) 300 MG capsule 300 mg, Oral, Nightly   hydrOXYzine  (ATARAX ) 25 MG tablet 25 mg, Oral, Every 6 hours PRN   insulin  aspart (NOVOLOG  FLEXPEN U-100 INSULIN ) 100 unit/mL (3 mL) injection pen 35 Units, Subcutaneous, 3 times a day (AC)   insulin  degludec (TRESIBA  FLEXTOUCH U-200) 200 unit/mL (3 mL) InPn 60 Units, Subcutaneous, Daily (standard), Max dose of 100 units per day.   multivitamin (TAB-A-VITE/THERAGRAN) per tablet 1 tablet, Daily (standard)   mycophenolate  (CELLCEPT ) 250 mg capsule 250 mg, Oral, 2 times a day (standard)   pantoprazole  (PROTONIX ) 40 MG tablet 40 mg, Daily PRN   pen needle, diabetic (TRUEPLUS PEN NEEDLE) 32 gauge x 5/32 (4 mm) Ndle Use with insulin  up to 4 times a day as needed.   sevelamer  (RENAGEL ) 800 MG tablet 800 mg, 3 times a day (with meals)   tacrolimus  (PROGRAF ) 0.5 MG capsule 0.5 mg, Oral, 2 times a day   tirzepatide  (MOUNJARO ) 15 mg/0.5 mL PnIj 15 mg, Subcutaneous, Every 7 days        ALLERGIES  Patient has no known allergies.    MEDICAL HISTORY  Past Medical History[2]    SOCIAL HISTORY  Social History     Social History Narrative    From [place]    He was in the [employer].  Now lives in [place]    He works in Engineer, petroleum        Married    1 child (age 13.5)      reports that he has never smoked. He has been exposed to tobacco smoke. He has never used smokeless tobacco. He reports that he does not drink alcohol and does not use drugs.     FAMILY HISTORY  Family History[3]     Physical Exam:  Vitals:    04/17/24 1500   BP: 137/74   Pulse: 74   Resp: 16   Temp:    SpO2:      I/O this shift:  In: 240 [P.O.:240]  Out: -     Intake/Output Summary (Last 24 hours) at 04/17/2024 1517  Last data filed at 04/17/2024 0756  Gross per 24 hour   Intake 720 ml   Output --   Net 720 ml       General: well-appearing, no acute distress  Heart: RRR, no m/r/g  Lungs: CTAB, normal wob  Abd: soft, non-tender, non-distended  Ext: 1+ edema  Access: Left IJ tunneled catheter  Right AVF + Thrill and bruis            [1]   Current Facility-Administered Medications:     acetaminophen  (TYLENOL ) tablet 650 mg, Oral, Q6H PRN    albumin human 25 % 25 g, Intravenous, Each time in dialysis PRN    sucralfate  (CARAFATE ) oral suspension, Oral, Once **AND** aluminum -magnesium  hydroxide-simethicone  (MAALOX MAX) 80-80-8 mg/mL oral suspension, Oral, Once    apixaban  (ELIQUIS ) tablet 5 mg, Oral, BID    calcitriol  (ROCALTROL ) capsule 0.25 mcg, Oral, Each time in dialysis    cholecalciferol (vitamin D3 25 mcg (1,000 units)) tablet 50 mcg, Oral, Daily    dextrose  50 % in water (D50W) 50 % solution 12.5 g, Intravenous, Q15 Min PRN    famotidine  (PEPCID ) tablet 10 mg, Oral, Daily PRN    gabapentin  (NEURONTIN ) capsule 300 mg, Oral, Nightly    glucagon  injection 1 mg, Intramuscular, Once PRN    glucose chewable tablet 16 g, Oral, Q10 Min PRN    glucose chewable tablet 16 g, Oral, Q10 Min PRN    heparin  (porcine) 1000 unit/mL injection 4,000 Units, Dialysis Circuit, Each time in dialysis    heparin  (porcine) 1000 unit/mL injection 4,000 Units, Dialysis Circuit, Each time in dialysis    insulin  glargine (LANTUS ) injection BASAL 60 Units, Subcutaneous, Nightly    insulin  lispro (HumaLOG ) injection 30 Units, Subcutaneous, TID AC    insulin  lispro (HumaLOG ) injection CORRECTIONAL 0-20 Units, Subcutaneous, ACHS    melatonin tablet 3 mg, Oral, Nightly PRN    multivitamins, therapeutic with minerals tablet 1 tablet, Oral, Daily    mycophenolate  (CELLCEPT ) capsule 250 mg, Oral, BID    rifAXIMin  (XIFAXAN ) tablet 550 mg, Oral, BID    senna (SENOKOT) tablet 2 tablet, Oral, Nightly PRN    sevelamer  (RENVELA ) tablet 800 mg, Oral, 3xd Meals    sodium chloride  0.9% (NS) bolus 250 mL, Intravenous, Each time in dialysis PRN    tacrolimus  (PROGRAF ) capsule 0.5 mg, Oral, BID    ursodiol  (ACTIGALL ) capsule 600 mg, Oral, BID  [2]   Past Medical History:  Diagnosis Date    CHF (congestive heart failure) (CMS-HCC)     COVID-19 07/18/2019    Diabetes mellitus (CMS-HCC)     Family history of malignant neoplasm of prostate 06/23/2015    Heart disease     Heart murmur  Hepatic cirrhosis    (CMS-HCC) 06/23/2015    Overview:  Secondary to NASH; followed by Dr. Lindaann, GI.  Last Assessment & Plan:  Relevant Hx: Course: Daily Update: Today's Plan:     Hypertension     Hypogonadism in male 02/12/2014    Kidney stone     Liver cirrhosis secondary to NASH (nonalcoholic steatohepatitis) (CMS-HCC)     s/p liver transplant 2013    Obesity    [3]   Family History  Problem Relation Age of Onset    Diabetes Father Diabetes Paternal Grandfather     Liver disease Maternal Uncle     Cancer Maternal Grandmother     Melanoma Neg Hx     Basal cell carcinoma Neg Hx     Squamous cell carcinoma Neg Hx     Kidney disease Neg Hx

## 2024-04-17 NOTE — Unmapped (Signed)
 HEMODIALYSIS NURSE PROCEDURE NOTE       Treatment Number:  1 Room / Station:  3    Procedure Date:  04/17/24 Device Name/Number: amber    Total Dialysis Treatment Time:  268 Min.    CONSENT:    Written consent was obtained prior to the procedure and is detailed in the medical record.  Prior to the start of the procedure, a time out was taken and the identity of the patient was confirmed via name, medical record number and date of birth.     WEIGHT:   Date/Time Pre-Treatment Weight (kg) Estimated Dry Weight (kg) Patient Goal Weight (kg) Total Goal Weight (kg)    04/17/24 1145 123.4 kg (272 lb 0.8 oz)  116 kg (255 lb 11.7 oz)  5 kg (11 lb 0.4 oz)  5.55 kg (12 lb 3.8 oz)        Date/Time Post-Treatment Weight (kg) Treatment Weight Change (kg)    04/17/24 1715 118.5 kg (261 lb 3.9 oz)  -4.91 kg      Active Dialysis Orders (168h ago, onward)       Start     Ordered    04/19/24 0700  Hemodialysis inpatient  Every Tue,Thu,Sat      Comments: 1Hr DUF 2L  4hr HD 3-4L with HD   Question Answer Comment   Patient HD Status: Chronic    New Start? No    K+ 2 meq/L    Ca++ 2.5 meq/L    Bicarb 35 meq/L    Na+ 137 meq/L    Na+ Modeling none    Dialyzer [id number]    Dialysate Temperature (C) 36.5    BFR-As tolerated to a maximum of: 500 mL/min    DFR 800 mL/min    Duration of treatment Other (Specify)    Dry weight (kg) 116    Challenge dry weight (kg) no    Fluid removal (L) to EDW    Tubing Adult = 142 ml    Access Site Dialysis Catheter    Access Site Location Right        04/17/24 1412                  ASSESSMENT:  General appearance: alert  Neurologic: Grossly normal  Lungs: diminished  Heart: wnl  Abdomen: obese    ACCESS SITE:       Hemodialysis Catheter 04/02/24 Left Internal jugular 2.1 mL 2.1 mL (Active)   Site Assessment Clean;Dry;Intact 04/17/24 1715   Proximal Lumen Status / Patency Gentamicin  Citrate Locked 04/17/24 1715   Proximal Lumen Intervention Deaccessed 04/17/24 1715   Medial Lumen Status / Patency Gentamicin  Citrate Locked 04/17/24 1715   Medial Lumen Intervention Deaccessed 04/17/24 1715   Dressing Intervention Dressing changed 04/17/24 1715   Dressing Status      Changed 04/17/24 1715   Verification by X-ray Yes 04/17/24 1715   Site Condition No complications 04/17/24 1715   Dressing Type CHG gel;Occlusive;Transparent 04/17/24 1715   Dressing Drainage Description Sanguineous 04/02/24 1625   Dressing Change Due 04/24/24 04/17/24 1715   Line Necessity Reviewed? Y 04/17/24 1715   Line Necessity Indications Yes - Hemodialysis 04/17/24 1715   Line Necessity Reviewed With nephro 04/17/24 1715           Catheter fill volumes:    Arterial: 2.1 mL Venous: 2.1 mL   Catheter filled with gen citrate post procedure.     Patient Lines/Drains/Airways Status       Active Peripheral &  Central Intravenous Access       Name Placement date Placement time Site Days    Peripheral IV 04/16/24 Left Antecubital 04/16/24  1858  Antecubital  less than 1    Hemodialysis Catheter 04/02/24 Left Internal jugular 2.1 mL 2.1 mL 04/02/24  1214  Internal jugular  15                   LAB RESULTS:  Lab Results   Component Value Date    NA 136 04/17/2024    K 5.4 (H) 04/17/2024    CL 98 04/17/2024    CO2 22.7 04/17/2024    BUN 26 (H) 04/17/2024    CREATININE 9.22 (H) 04/17/2024    GLU 352 (H) 04/17/2024    GLUF 138 10/06/2014    CALCIUM  8.2 (L) 04/17/2024    CAION 3.67 (L) 02/02/2024    ICAV 4.88 03/04/2012    PHOS 5.7 (H) 04/17/2024    MG 2.1 04/17/2024    PTH 267.5 (H) 10/14/2021    IRON 95 11/09/2022    LABIRON 36 11/09/2022    TRANSFERRIN 212.0 (L) 11/09/2022    FERRITIN 169.8 11/09/2022    TIBC 267.1 11/09/2022     Lab Results   Component Value Date    WBC 5.1 04/17/2024    HGB 10.9 (L) 04/17/2024    HCT 32.6 (L) 04/17/2024    PLT 203 04/17/2024    PHART 7.36 03/04/2012    PO2ART 88 03/04/2012    PCO2ART 43 03/04/2012    HCO3ART 23.4 03/04/2012    BEART -1.3 03/04/2012    O2SATART 98.1 03/04/2012    PTINR : 05/06/2012    APTT 290.0 (HH) 01/19/2024 VITAL SIGNS:    Date/Time Temp Temp src       04/17/24 1720 36.6 ??C (97.8 ??F)  Oral        Date/Time Pulse BP MAP (mmHg) Patient Position    04/17/24 1720 75  129/73  --  Sitting     04/17/24 1708 77  114/68  --  Sitting     04/17/24 1700 65  114/70  --  Sitting     04/17/24 1630 77  119/65  --  Sitting     04/17/24 1600 81  121/66  --  Sitting     04/17/24 1530 80  120/71  --  Sitting     04/17/24 1500 74  137/74  --  Sitting     04/17/24 1430 85  137/78  --  Sitting     04/17/24 1400 77  107/56  --  Sitting     04/17/24 1340 74  131/84  --  Sitting     04/17/24 1330 82  128/85  --  Sitting     04/17/24 1300 86  151/119  --  Sitting     04/17/24 1240 70  165/107  --  Sitting       Date/Time Blood Volume Change (%) HCT HGB Critline O2 SAT %    04/17/24 1708 -14.8 %  35.9  12.2  57     04/17/24 1700 -14.9 %  35.9  12.2  58.9     04/17/24 1630 -13.6 %  35.4  12  58.6     04/17/24 1600 -13.1 %  35.2  11.9  63.2     04/17/24 1530 -11.6 %  34.3  11.7  68.2     04/17/24 1500 -10.4 %  34.1  11.6  67.9  04/17/24 1430 -12.5 %  34.9  11.9  57.3     04/17/24 1400 -11.8 %  34.7  10.4  58.8     04/17/24 1340 -13.1 %  35.2  13.1  63.1       Date/Time Resp SpO2 O2 Device O2 Flow Rate (L/min)    04/17/24 1720 17  --  --  --    04/17/24 1708 18  --  --  --    04/17/24 1700 18  --  --  --    04/17/24 1630 20  --  --  --    04/17/24 1530 17  --  --  --    04/17/24 1500 16  --  --  --    04/17/24 1430 17  --  --  --    04/17/24 1400 16  --  --  --    04/17/24 1340 16  --  --  --    04/17/24 1330 17  --  --  --    04/17/24 1300 18  --  --  --    04/17/24 1240 19  --  --  --        Date/Time Therapy Number Dialyzer Hemodialysis Line Type All Machine Alarms Passed    04/17/24 1145 1  F-180 (98 mLs)  Adult (142 m/s)  Yes       Date/Time Air Detector Saline Line Double Clampled Hemo-Safe Applied Dialysis Flow (mL/min)    04/17/24 1145 Engaged  --  --  800 mL/min       Date/Time Verify Priming Solution Priming Volume Hemodialysis Independent pH Hemodialysis Machine Conductivity (mS/cm)    04/17/24 1145 0.9% NS  300 mL  --  13.7 mS/cm       Date/Time Hemodialysis Independent Conductivity (mS/cm) Bicarb Conductivity Residual Bleach Negative Total Chlorine    04/17/24 1145 13.6 mS/cm  -- Yes  0       Date/Time Pre-Hemodialysis Comments    04/17/24 1145 alert and oriented       Date/Time Blood Flow Rate (mL/min) Arterial Pressure (mmHg) Venous Pressure (mmHg) Transmembrane Pressure (mmHg)    04/17/24 1708 300 mL/min  -180 mmHg  140 mmHg  30 mmHg     04/17/24 1700 300 mL/min  -190 mmHg  140 mmHg  20 mmHg     04/17/24 1630 300 mL/min  -190 mmHg  130 mmHg  20 mmHg     04/17/24 1600 300 mL/min  -180 mmHg  120 mmHg  10 mmHg     04/17/24 1530 300 mL/min  -180 mmHg  120 mmHg  10 mmHg     04/17/24 1500 300 mL/min  -180 mmHg  120 mmHg  10 mmHg     04/17/24 1430 350 mL/min  -220 mmHg  150 mmHg  10 mmHg     04/17/24 1400 350 mL/min  -220 mmHg  160 mmHg  10 mmHg     04/17/24 1340 400 mL/min  -220 mmHg  160 mmHg  10 mmHg     04/17/24 1330 400 mL/min  -220 mmHg  160 mmHg  10 mmHg     04/17/24 1300 400 mL/min  -220 mmHg  160 mmHg  20 mmHg     04/17/24 1240 400 mL/min  -230 mmHg  160 mmHg  30 mmHg       Date/Time Ultrafiltration Rate (mL/hr) Ultrafiltrate Removed (mL) Dialysate Flow Rate (mL/min) KECN (Kecn)    04/17/24 1708 880 mL/hr  5220 mL  800 ml/min  --  04/17/24 1700 880 mL/hr  5172 mL  800 ml/min  --     04/17/24 1630 880 mL/hr  4737 mL  800 ml/min  --     04/17/24 1600 880 mL/hr  4235 mL  800 ml/min  --     04/17/24 1530 880 mL/hr  3740 mL  800 ml/min  --     04/17/24 1500 880 mL/hr  3565 mL  800 ml/min  --     04/17/24 1430 720 mL/hr  3027 mL  800 ml/min  --     04/17/24 1400 1000 mL/hr  2654 mL  800 ml/min  --     04/17/24 1340 1000 mL/hr  2300 mL  800 ml/min  --     04/17/24 1330 0 mL/hr  2090 mL  0 ml/min  --     04/17/24 1300 0 mL/hr  1040 mL  0 ml/min  --     04/17/24 1240 --  DUF  0 mL  --  DUF  --       Date/Time Intra-Hemodialysis Comments 04/17/24 1708 Pt requesting     04/17/24 1700 Pt tallking on the phone without issue     04/17/24 1630 Pt denies needs     04/17/24 1600 Pt cont watching TV     04/17/24 1530 PT denies needs     04/17/24 1500 Pt cont watching TV     04/17/24 1430 Pt denies needs     04/17/24 1400 pt watching tv     04/17/24 1340 DUF complete/HD started     04/17/24 1330 DUF cont     04/17/24 1300 DUF continues     04/17/24 1240 1 hr DUF + 3hr HD tx       Date/Time Rinseback Volume (mL) On Line Clearance: spKt/V Total Liters Processed (L/min) Dialyzer Clearance    04/17/24 1715 300 mL  0.84 spKt/V  57.7 L/min  Moderately streaked       Date/Time Post-Hemodialysis Comments    04/17/24 1715 VSS       Date/Time Total Hemodialysis Replacement Volume (mL) Total Ultrafiltrate Output (mL)    04/17/24 1715 --  4920 mL        [id number] - Medicaitons Given During Treatment  (last 5 hrs)           Antonia Jicha T, RN         Medication Name Action Time Action Route Rate Dose User     calcitriol  (ROCALTROL ) capsule 0.25 mcg 04/17/24 1249 Given Oral  0.25 mcg Delores Mayo T, RN     gentamicin -sodium citrate  lock solution in NS 04/17/24 1731 Given Intra-cannular  2.1 mL Delores Mayo DASEN, RN     gentamicin -sodium citrate  lock solution in NS 04/17/24 1731 Given Intra-cannular  2.1 mL Delores Mayo DASEN, RN     heparin  (porcine) 1000 unit/mL injection 4,000 Units 04/17/24 1538 Given Dialysis Circuit  4,000 Units Delores Mayo T, RN     heparin  (porcine) 1000 unit/mL injection 4,000 Units 04/17/24 1250 Given Dialysis Circuit  4,000 Units Delores Mayo T, RN                      Patient tolerated treatment in a  Dialysis Recliner.

## 2024-04-17 NOTE — Unmapped (Signed)
 1hr DUF + 4hr HD = 5Hr tx with UF as tol.

## 2024-04-17 NOTE — Unmapped (Signed)
 [facility] Nephrology Hemodialysis Procedure Note     04/17/2024    Richard [name] was seen and examined on hemodialysis    CHIEF COMPLAINT: End Stage Renal Disease    INTERVAL HISTORY: Pateint doing well starting he feels like he is volume up willing to stay longer to have fluid removed.     DIALYSIS TREATMENT DATA:  Estimated Dry Weight (kg): 116 kg (255 lb 11.7 oz) Patient Goal Weight (kg): 5 kg (11 lb 0.4 oz)   Pre-Treatment Weight (kg): 123.4 kg (272 lb 0.8 oz)    Dialysis Bath  Bath: 2 K+ / 2.5 Ca+  Dialysate Na (mEq/L): 137 mEq/L  Dialysate HCO3 (mEq/L): 35 mEq/L Dialyzer: F-180 (98 mLs)   Blood Flow Rate (mL/min): 300 mL/min Dialysis Flow (mL/min): 800 mL/min   Machine Temperature (C): 36.5 ??C (97.7 ??F)      PHYSICAL EXAM:  Vitals:  Temp:  [36.1 ??C (96.9 ??F)-36.9 ??C (98.5 ??F)] 36.9 ??C (98.5 ??F)  Pulse:  [69-86] 74  SpO2 Pulse:  [69-77] 74  BP: (107-190)/(56-119) 137/74  MAP (mmHg):  [93-121] 102    General: in no acute distress, currently dialyzing in a Hemodialysis Recliner  Pulmonary: normal respiratory effort  Cardiovascular: regular rate and rhythm  Extremities: 1+  edema  Access: Left IJ tunneled catheter     LAB DATA:  Lab Results   Component Value Date    NA 136 04/17/2024    K 5.4 (H) 04/17/2024    CL 98 04/17/2024    CO2 22.7 04/17/2024    BUN 26 (H) 04/17/2024    CREATININE 9.22 (H) 04/17/2024    GLUCOSE 196 03/03/2012    CALCIUM  8.2 (L) 04/17/2024    MG 2.1 04/17/2024    PHOS 5.7 (H) 04/17/2024    ALBUMIN 3.0 (L) 04/16/2024      Lab Results   Component Value Date    HCT 32.6 (L) 04/17/2024    WBC 5.1 04/17/2024        ASSESSMENT/PLAN:  End Stage Renal Disease on Intermittent Hemodialysis:  UF goal: 4-5L as tolerated  Adjust medications for a GFR <10  Avoid nephrotoxic agents  Last HD Treatment:Started (04/17/24)     Bone Mineral Metabolism:  Lab Results   Component Value Date    CALCIUM  8.2 (L) 04/17/2024    CALCIUM  8.2 (L) 04/16/2024    Lab Results   Component Value Date    ALBUMIN 3.0 (L) 04/16/2024 ALBUMIN 3.2 (L) 04/08/2024      Lab Results   Component Value Date    PHOS 5.7 (H) 04/17/2024    PHOS 4.6 04/16/2024    Lab Results   Component Value Date    PTH 267.5 (H) 10/14/2021      Labs appropriate, no changes.    Anemia:   Lab Results   Component Value Date    HGB 10.9 (L) 04/17/2024    HGB 11.1 (L) 04/16/2024    HGB 12.7 (L) 04/08/2024    Iron Saturation (%)   Date Value Ref Range Status   11/09/2022 36 % Final   05/15/2012 39 20 - 50 % Final      Lab Results   Component Value Date    FERRITIN 169.8 11/09/2022       Anemia labs appropriate, no changes.    Vascular Access:  Vascular Access functioning well - no need for intervention  Blood Flow Rate (mL/min): 300 mL/min    IV Antibiotics to be administered at discharge:  No  This procedure was fully reviewed with the patient and/or their decision-maker. The risks, benefits, and alternatives were discussed prior to the procedure. All questions were answered and written informed consent was obtained.    [name], MD  [facility] Division of Nephrology & Hypertension

## 2024-04-18 LAB — CBC
HEMATOCRIT: 33 % — ABNORMAL LOW (ref 39.0–48.0)
HEMOGLOBIN: 11 g/dL — ABNORMAL LOW (ref 12.9–16.5)
MEAN CORPUSCULAR HEMOGLOBIN CONC: 33.4 g/dL (ref 32.0–36.0)
MEAN CORPUSCULAR HEMOGLOBIN: 28.2 pg (ref 25.9–32.4)
MEAN CORPUSCULAR VOLUME: 84.6 fL (ref 77.6–95.7)
MEAN PLATELET VOLUME: 8.2 fL (ref 6.8–10.7)
PLATELET COUNT: 179 10*9/L (ref 150–450)
RED BLOOD CELL COUNT: 3.9 10*12/L — ABNORMAL LOW (ref 4.26–5.60)
RED CELL DISTRIBUTION WIDTH: 13.9 % (ref 12.2–15.2)
WBC ADJUSTED: 4.7 10*9/L (ref 3.6–11.2)

## 2024-04-18 LAB — BASIC METABOLIC PANEL
ANION GAP: 15 mmol/L — ABNORMAL HIGH (ref 5–14)
BLOOD UREA NITROGEN: 20 mg/dL (ref 9–23)
BUN / CREAT RATIO: 3
CALCIUM: 8.3 mg/dL — ABNORMAL LOW (ref 8.7–10.4)
CHLORIDE: 97 mmol/L — ABNORMAL LOW (ref 98–107)
CO2: 25.3 mmol/L (ref 20.0–31.0)
CREATININE: 7.13 mg/dL — ABNORMAL HIGH (ref 0.73–1.18)
EGFR CKD-EPI (2021) MALE: 8 mL/min/1.73m2 — ABNORMAL LOW (ref >=60)
GLUCOSE RANDOM: 290 mg/dL — ABNORMAL HIGH (ref 70–179)
POTASSIUM: 4.8 mmol/L (ref 3.4–4.8)
SODIUM: 137 mmol/L (ref 135–145)

## 2024-04-18 LAB — MAGNESIUM: MAGNESIUM: 2.1 mg/dL (ref 1.6–2.6)

## 2024-04-18 LAB — PHOSPHORUS: PHOSPHORUS: 5.1 mg/dL (ref 2.4–5.1)

## 2024-04-18 MED ORDER — BLOOD-GLUCOSE METER KIT WRAPPER
ORAL | 11 refills | 0.00000 days | Status: CP
Start: 2024-04-18 — End: 2025-04-18

## 2024-04-18 MED ORDER — LANCETS
0 refills | 0.00000 days | Status: CP
Start: 2024-04-18 — End: 2024-04-18
  Filled 2024-04-18: qty 100, 25d supply, fill #0

## 2024-04-18 MED ORDER — PEN NEEDLE, DIABETIC 32 GAUGE X 5/32" (4 MM)
0 refills | 0.00000 days
Start: 2024-04-18 — End: 2024-04-18

## 2024-04-18 MED ORDER — BLOOD GLUCOSE TEST STRIPS
ORAL_STRIP | ORAL | 0 refills | 0.00000 days | Status: CP
Start: 2024-04-18 — End: 2024-04-18
  Filled 2024-04-18: qty 1, 1d supply, fill #0

## 2024-04-18 MED ORDER — INSULIN ASPART (U-100) 100 UNIT/ML (3 ML) SUBCUTANEOUS PEN
Freq: Three times a day (TID) | SUBCUTANEOUS | 2 refills | 34.00000 days | Status: CP
Start: 2024-04-18 — End: 2024-07-17

## 2024-04-18 MED ORDER — INSULIN DEGLUDEC (U-200) 200 UNIT/ML (3 ML) SUBCUTANEOUS PEN
Freq: Every day | SUBCUTANEOUS | 2 refills | 25.00000 days | Status: CP
Start: 2024-04-18 — End: 2024-07-17

## 2024-04-18 MED ADMIN — apixaban (ELIQUIS) tablet 5 mg: 5 mg | ORAL | @ 01:00:00

## 2024-04-18 MED ADMIN — rifAXIMin (XIFAXAN) tablet 550 mg: 550 mg | ORAL | @ 01:00:00 | Stop: 2025-04-17

## 2024-04-18 MED ADMIN — ursodiol (ACTIGALL) capsule 600 mg: 600 mg | ORAL | @ 01:00:00

## 2024-04-18 MED ADMIN — mycophenolate (CELLCEPT) capsule 250 mg: 250 mg | ORAL | @ 01:00:00

## 2024-04-18 MED ADMIN — insulin glargine (LANTUS) injection BASAL 60 Units: 60 [IU] | SUBCUTANEOUS | @ 01:00:00

## 2024-04-18 MED ADMIN — tacrolimus (PROGRAF) capsule 0.5 mg: .5 mg | ORAL | @ 01:00:00

## 2024-04-18 MED ADMIN — gabapentin (NEURONTIN) capsule 300 mg: 300 mg | ORAL | @ 01:00:00

## 2024-04-18 MED ADMIN — insulin lispro (HumaLOG) injection CORRECTIONAL 0-20 Units: 0-20 [IU] | SUBCUTANEOUS | @ 04:00:00

## 2024-04-18 MED ADMIN — ursodiol (ACTIGALL) capsule 600 mg: 600 mg | ORAL | @ 13:00:00 | Stop: 2024-04-18

## 2024-04-18 MED ADMIN — insulin lispro (HumaLOG) injection CORRECTIONAL 0-20 Units: 0-20 [IU] | SUBCUTANEOUS | @ 14:00:00 | Stop: 2024-04-18

## 2024-04-18 MED ADMIN — insulin lispro (HumaLOG) injection 30 Units: 30 [IU] | SUBCUTANEOUS | @ 18:00:00 | Stop: 2024-04-18

## 2024-04-18 MED ADMIN — insulin lispro (HumaLOG) injection 30 Units: 30 [IU] | SUBCUTANEOUS | @ 14:00:00 | Stop: 2024-04-18

## 2024-04-18 MED ADMIN — mycophenolate (CELLCEPT) capsule 250 mg: 250 mg | ORAL | @ 13:00:00 | Stop: 2024-04-18

## 2024-04-18 MED ADMIN — apixaban (ELIQUIS) tablet 5 mg: 5 mg | ORAL | @ 13:00:00 | Stop: 2024-04-18

## 2024-04-18 MED ADMIN — tacrolimus (PROGRAF) capsule 0.5 mg: .5 mg | ORAL | @ 13:00:00 | Stop: 2024-04-18

## 2024-04-18 MED ADMIN — cholecalciferol (vitamin D3 25 mcg (1,000 units)) tablet 50 mcg: 50 ug | ORAL | @ 13:00:00 | Stop: 2024-04-18

## 2024-04-18 MED ADMIN — sevelamer (RENVELA) tablet 800 mg: 800 mg | ORAL | @ 13:00:00 | Stop: 2024-04-18

## 2024-04-18 MED ADMIN — rifAXIMin (XIFAXAN) tablet 550 mg: 550 mg | ORAL | @ 13:00:00 | Stop: 2024-04-18

## 2024-04-18 MED ADMIN — multivitamins, therapeutic with minerals tablet 1 tablet: 1 | ORAL | @ 13:00:00 | Stop: 2024-04-18

## 2024-04-18 MED FILL — CONTOUR NEXT TEST STRIPS: 25 days supply | Qty: 100 | Fill #0

## 2024-04-18 NOTE — Discharge Summary (Signed)
 Physician Discharge Summary HBR  3 BT1 HBR  [address]  [place] [address]  Dept: [phone]  Loc: [phone]     Identifying Information:   TYREN DUGAR  13-Sep-1965  [id number]    Primary Care Physician: Jama Mayo, MD     Code Status: Full Code    Admit Date: 04/16/2024    Discharge Date: 04/18/2024     Discharge To: Home    Discharge Service: [facility] - General Medicine Floor Team (MED [facility]LLA - Randeen)     Discharge Attending Physician: Rollo BIRCH. Myer, MD    Discharge Diagnoses:   Principal Problem:    Hyperglycemia due to diabetes mellitus (CMS-HCC) (POA: Yes)  Active Problems:    Type 2 diabetes mellitus with hyperglycemia, with long-term current use of insulin  (CMS-HCC) (POA: Not Applicable)    Liver replaced by transplant (CMS-HCC) (POA: Not Applicable)    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) (POA: Not Applicable)    ESRD (end stage renal disease) on dialysis    (CMS-HCC) (POA: Not Applicable)    (HFpEF) heart failure with preserved ejection fraction (CMS-HCC) (POA: Yes)  Resolved Problems:    * No resolved hospital problems. *      Hospital Course:   RAHEEM KOLBE is a 58 y.o. 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       Symptomatic Hyperglycemia  Long-standing T2DM diagnosed post-liver transplant in 2015. Established with [facility] Endocrinology, current home regimen Tresiba  60u nightly and Novolog  SSI, Mounjaro  weekly. A1C 10.0 in July. Patient states he does not take scheduled mealtime insulin , only uses correctional and basal. Was also drinking sodas prior to admission, so adherence and nutrition thought to drive hyperglycemia on presentation. Symptomatic with drowsiness, decreased appetite, dry mouth. He is anuric iso ESRD. Reports home CGM reading >400 since Monday, found to have BG >700 on weekly dialysis labs on Tuesday. Serum BHB negative and VBG reassuring against DKA, serum osmolality elevated to 302. BG has downtrended into 200s since admission, PO intake decreased but improving. Discharged on increased basal insulin , 70 units nightly. Aspart 30u TID AC. Patient instructed to stop his correctional insulin  and start the scheduled mealtime insulin  instead (was often only using 10-15 units of short acting insulin  at home). Will need close followup with PCP to continue titrating insulin  regimen.      LLQ Pain   Reports few days of LLQ pain, abdominal exam benign. CT Ab/P notes small fat-containing umbilical hernia with adjacent intra-abdominal fat stranding, which could be contributing to pain. Clinical exam was not consistent with this. Additionally has known splenic artery pseudoaneurysm for which he is undergoing evaluation for embolization, though stable appearing on imaging. No bowel obstruction. Reassuringly afebrile and HDS. CRP 8.4, lactate 1.8.  GI cocktail ordered but patient declined. Pain improved on repeat exam with no visible discomfort on exam. Per chart review, has had fairly extensive work up for abdominal pain in the last 1-2 years without clear etiology.     ESRD on iHD - Hyperkalemia   Etiology of ESRD 2/2 CNI toxicity and HTN. Now on Tues Thurs Sat HD. Last HD session 10/21, well-tolerated. Recently underwent right brachiobasilic arteriovenous fistula creation on 02/20/24 as part of a two-stage transposition. Endorsing constant pain to RUE, no overt signs of infection and palpable thrill. Likely seroma per surgery evaluation. Received dialysis while hospitalized.     Hyponatremia  Low at 130 on presentation. Normal on correction for hyperglycemia. Has since normalized with decreased in blood glucose.     [name] Cirrhosis s/p OLT (02/2012)  Established with [facility] Hepatology (Dr. Lennette, LOV 04/08/24). No history of rejection or biliary issues, however has developed evidence of portosystemic shunting with no fibrosis on biopsy and no elevated HVPG (see below). Multiple prior admissions for hepatic encephalopathy in the setting of portosystemic shunting and elevated pneumonia, however more recently has not had issues with HE with effective HD. States he is no longer taking lactulose  regularly though is taking Rifaximin . Tac trough level: 3.2  tacrolimus  0.5mg  BID (goal 2-4) , cellcept  250mg  BID, ursodiol  600mg  BID, and Rifaximin  BID continued while inpatient.     Hx DVT: Prior DVT in RLE and more recently left internal jugular vein. Eliquis  5mg  BID continued.     Hx of OSA: No longer uses CPAP.     CAD: NM spect in 2024 with moderate sized, mild in severity, fixed perfusion defect mid anterior, apical anterior and apical segments.       Outpatient Provider Follow Up Issues:   [ ] titrating insulin  regimen.   [ ] intermittent abdominal pain, unremarkable workup inpatient.     Touchbase with Outpatient Provider:  Warm Handoff: Completed on 04/18/24 by Olen (other: Resident Assistant) via Phone    Procedures:  None  ______________________________________________________________________  Discharge Medications:      Your Medication List        STOP taking these medications      oxyCODONE  5 MG immediate release tablet  Commonly known as: ROXICODONE             START taking these medications      blood-glucose meter kit  Use as directed to check blood sugars     CONTOUR NEXT EZ METER Misc  Generic drug: blood-glucose meter  Use as instructed     CONTOUR NEXT TEST STRIPS Strp  Generic drug: blood sugar diagnostic  Use to check blood sugar as directed with insulin  3 times a day and for symptoms of high or low blood sugar.     MICROLET LANCET Misc  Generic drug: lancets  Use to check blood sugar as directed with insulin  3 times a day and for symptoms of high or low blood sugar.            CHANGE how you take these medications      insulin  aspart 100 unit/mL (3 mL) injection pen  Commonly known as: NovoLOG  Flexpen U-100 Insulin   Inject 0.3 mL (30 Units total) under the skin Three (3) times a day before meals.  What changed: how much to take     insulin  degludec 200 unit/mL (3 mL) Inpn  Commonly known as: TRESIBA  FLEXTOUCH U-200  Inject 0.35 mL (70 Units total) under the skin daily. Max dose of 100 units per day.     If fasting glucose is greater than 180, increase basal insulin  by 5 units. Recheck fasting glucose the following morning and continue to increase by 5 units daily until fasting sugar less than 180.  What changed:   how much to take  additional instructions     TRUEPLUS PEN NEEDLE 32 gauge x 5/32 (4 mm) Ndle  Generic drug: pen needle, diabetic  Use with insulin  up to 4 times a day as needed.  What changed: Another medication with the same name was added. Make sure you understand how and when to take each.     pen needle,  diabetic 32 gauge x 5/32 (4 mm) Ndle  Use with insulin  up to 4 times daily as needed.  What changed: You were already taking a medication with the same name, and this prescription was added. Make sure you understand how and when to take each.            CONTINUE taking these medications      DEXCOM G6 RECEIVER Misc  Generic drug: blood-glucose,receiver,cont  1 each by Miscellaneous route in the morning. Dispense 1 receiver annually.  Sent to [facility].     DEXCOM G7 SENSOR Devi  Generic drug: blood-glucose sensor  Use to continuously monitor blood glucose.  Change sensor every 10 days.     ELIQUIS  5 mg Tab  Generic drug: apixaban   Take 1 tablet (5 mg total) by mouth two (2) times a day.     famotidine  10 MG tablet  Commonly known as: PEPCID   Take 1 tablet (10 mg total) by mouth daily as needed for heartburn.     gabapentin  300 MG capsule  Commonly known as: NEURONTIN   Take 1 capsule (300 mg total) by mouth nightly.     hydrOXYzine  25 MG tablet  Commonly known as: ATARAX   Take 1 tablet (25 mg total) by mouth every six (6) hours as needed for anxiety, itching or allergies.     MOUNJARO  15 mg/0.5 mL Pnij  Generic drug: tirzepatide   Inject 15 mg under the skin every seven (7) days.     multivitamin per tablet  Commonly known as: TAB-A-VITE/THERAGRAN  Take 1 tablet by mouth in the morning.     mycophenolate  250 mg capsule  Commonly known as: CELLCEPT   Take 1 capsule (250 mg total) by mouth two (2) times a day.     pantoprazole  40 MG tablet  Commonly known as: Protonix   Take 1 tablet (40 mg total) by mouth daily as needed.     PROGRAF  0.5 mg capsule  Generic drug: tacrolimus   Take 1 capsule (0.5 mg total) by mouth two (2) times a day.     sevelamer  800 MG tablet  Commonly known as: RENAGEL   Take 1 tablet (800 mg total) by mouth Three (3) times a day with a meal.     VITAMIN D3 ORAL  Take 2,000 Units by mouth in the morning.              Allergies:  Patient has no known allergies.  ______________________________________________________________________  Pending Test Results:      Most Recent Labs:  All lab results last 24 hours -   Recent Results (from the past 24 hours)   POCT Glucose    Collection Time: 04/17/24 11:37 PM   Result Value Ref Range    Glucose, POC 381 (H) 70 - 179 mg/dL   Phosphorus Level    Collection Time: 04/18/24  6:59 AM   Result Value Ref Range    Phosphorus 5.1 2.4 - 5.1 mg/dL   Magnesium  Level    Collection Time: 04/18/24  6:59 AM   Result Value Ref Range    Magnesium  2.1 1.6 - 2.6 mg/dL   Basic Metabolic Panel    Collection Time: 04/18/24  6:59 AM   Result Value Ref Range    Sodium 137 135 - 145 mmol/L    Potassium 4.8 3.4 - 4.8 mmol/L    Chloride 97 (L) 98 - 107 mmol/L    CO2 25.3 20.0 - 31.0 mmol/L    Anion Gap 15 (H) 5 - 14 mmol/L  BUN 20 9 - 23 mg/dL    Creatinine 2.86 (H) 0.73 - 1.18 mg/dL    BUN/Creatinine Ratio 3     eGFR CKD-EPI (2021) Male 8 (L) >=60 mL/min/1.75m2    Glucose 290 (H) 70 - 179 mg/dL    Calcium  8.3 (L) 8.7 - 10.4 mg/dL   CBC    Collection Time: 04/18/24  6:59 AM   Result Value Ref Range    WBC 4.7 3.6 - 11.2 10*9/L    RBC 3.90 (L) 4.26 - 5.60 10*12/L    HGB 11.0 (L) 12.9 - 16.5 g/dL HCT 66.9 (L) 60.9 - 51.9 %    MCV 84.6 77.6 - 95.7 fL    MCH 28.2 25.9 - 32.4 pg    MCHC 33.4 32.0 - 36.0 g/dL    RDW 86.0 87.7 - 84.7 %    MPV 8.2 6.8 - 10.7 fL    Platelet 179 150 - 450 10*9/L   POCT Glucose    Collection Time: 04/18/24  8:41 AM   Result Value Ref Range    Glucose, POC 289 (H) 70 - 179 mg/dL   POCT Glucose    Collection Time: 04/18/24 11:54 AM   Result Value Ref Range    Glucose, POC 355 (H) 70 - 179 mg/dL     CBC - Results in Past 30 Days  Result Component Current Result Ref Range Previous Result Ref Range   HCT 33.0 (L) (04/18/2024) 39.0 - 48.0 % 32.6 (L) (04/17/2024) 39.0 - 48.0 %   HGB 11.0 (L) (04/18/2024) 12.9 - 16.5 g/dL 89.0 (L) (89/76/7974) 87.0 - 16.5 g/dL   MCH 71.7 (89/75/7974) 25.9 - 32.4 pg 28.2 (04/17/2024) 25.9 - 32.4 pg   MCHC 33.4 (04/18/2024) 32.0 - 36.0 g/dL 66.5 (89/76/7974) 67.9 - 36.0 g/dL   MCV 15.3 (89/75/7974) 77.6 - 95.7 fL 84.5 (04/17/2024) 77.6 - 95.7 fL   MPV 8.2 (04/18/2024) 6.8 - 10.7 fL 8.0 (04/17/2024) 6.8 - 10.7 fL   Platelet 179 (04/18/2024) 150 - 450 10*9/L 203 (04/17/2024) 150 - 450 10*9/L   RBC 3.90 (L) (04/18/2024) 4.26 - 5.60 10*12/L 3.86 (L) (04/17/2024) 4.26 - 5.60 10*12/L   WBC 4.7 (04/18/2024) 3.6 - 11.2 10*9/L 5.1 (04/17/2024) 3.6 - 11.2 10*9/L     BMP - Results in Past 30 Days  Result Component Current Result Ref Range Previous Result Ref Range   BUN 20 (04/18/2024) 9 - 23 mg/dL 26 (H) (89/76/7974) 9 - 23 mg/dL   Chloride 97 (L) (89/75/7974) 98 - 107 mmol/L 98 (04/17/2024) 98 - 107 mmol/L   CO2 25.3 (04/18/2024) 20.0 - 31.0 mmol/L 22.7 (04/17/2024) 20.0 - 31.0 mmol/L   Creatinine 7.13 (H) (04/18/2024) 0.73 - 1.18 mg/dL 0.77 (H) (89/76/7974) 9.26 - 1.18 mg/dL   Glucose 709 (H) (89/75/7974) 70 - 179 mg/dL 647 (H) (89/76/7974) 70 - 179 mg/dL   Potassium 4.8 (89/75/7974) 3.4 - 4.8 mmol/L 5.4 (H) (04/17/2024) 3.4 - 4.8 mmol/L   Sodium 137 (04/18/2024) 135 - 145 mmol/L 136 (04/17/2024) 135 - 145 mmol/L       Relevant Studies/Radiology:  ECG 12 Lead  Result Date: 04/16/2024  SINUS RHYTHM WITH 1ST DEGREE AV BLOCK LEFT AXIS DEVIATION RIGHT BUNDLE BRANCH BLOCK POSSIBLE LATERAL INFARCT  , AGE UNDETERMINED INFERIOR INFARCT , AGE UNDETERMINED ABNORMAL ECG WHEN COMPARED WITH ECG OF 26-Feb-2024 16:04, INFERIOR INFARCT IS NOW PRESENT QT HAS SHORTENED Confirmed by Von Shawl ([id number]) on 04/16/2024 8:59:05 PM    CT Abdomen Pelvis W IV Contrast  Result Date: 04/16/2024  EXAM: CT ABDOMEN PELVIS W CONTRAST ACCESSION: [id number] UN REPORT DATE: 04/16/2024 7:35 PM CLINICAL INDICATION: 58 years old with LLQ pain  COMPARISON: CT abdomen 01/02/2024. TECHNIQUE: A helical CT scan of the abdomen and pelvis was obtained following IV contrast from the lung bases through the pubic symphysis. Images were reconstructed in the axial plane. Coronal and sagittal reformatted images were also provided for further evaluation. FINDINGS: LOWER CHEST: Granulomas in the right lower lobe, left lower lobe and lingula. LIVER: Sequelae of liver transplantation. Normal hepatic contour. No focal liver lesions. 1 BILIARY: The gallbladder is surgically absent. No biliary ductal dilatation.  SPLEEN: Normal in size and contour. Prominent splenic vascular calcifications. PANCREAS: Diffuse fatty atrophy of the pancreas. No focal lesions.  No ductal dilation. ADRENAL GLANDS: Normal appearance of the adrenal glands. KIDNEYS/URETERS: Bilateral atrophic kidneys with severe cortical thinning. Symmetric renal enhancement. Nonobstructive left renal calculus measuring 0.7 cm. Bilateral subcentimeter hypodensities, too small to characterize on CT. No hydronephrosis.  No solid renal mass. BLADDER: Under-distended bladder with fatty infiltration of the bladder wall. REPRODUCTIVE ORGANS: Unremarkable. GI TRACT: Unremarkable stomach and GE junction. The duodenum is normal in course and caliber. No findings of bowel obstruction or acute inflammation.  Normal appendix. PERITONEUM, RETROPERITONEUM AND MESENTERY: No free air.  No ascites.  No fluid collection. LYMPH NODES: No adenopathy. VESSELS: Hepatic and portal veins are patent. Sequela of portal hypertension with extensive abdominal varices related to portosystemic shunting. Unchanged partially thrombosed 2.6 cm distal splenic artery aneurysm which resides along the splenic hilum. Normal caliber aorta with scattered calcifications.  BONES and SOFT TISSUES: No aggressive osseous lesions. Small fat and fluid containing umbilical hernia with adjacent fat stranding along the anterior abdominal wall soft tissues (2:100), which could reflect vascular strangulation within the hernia. Mild diffuse body wall edema. IMPRESSION 1.  No evidence of intra-abdominal acute pathology. 2.  Small fat-containing umbilical hernia with adjacent intra-abdominal fat stranding, which may be an etiology of pain. 3.  Status post liver transplantation with residual sequela of portal hypertension including extensive abdominal varices 4.  Unchanged 2.6 cm partially thrombosed splenic artery aneurysm.     XR Chest Portable  Result Date: 04/16/2024  EXAM: XR CHEST PORTABLE ACCESSION: [id number] UN REPORT DATE: 04/16/2024 7:47 PM CLINICAL INDICATION: SHORTNESS OF BREATH  TECHNIQUE: Single View AP Chest Radiograph. COMPARISON: 01/26/2024 chest radiograph FINDINGS: Left IJ approach CVC with its tip in proximal SVC, unchanged. Lungs hypoinflated. Otherwise, clear lungs. No significant pleural effusions. No pneumothorax. Stable enlarged cardiomediastinal silhouette:     Clear lungs.    ______________________________________________________________________  Discharge Instructions:   Activity Instructions       Activity as tolerated                       Follow Up instructions and Outpatient Referrals     Call MD for:  persistent nausea or vomiting      Call MD for:  severe uncontrolled pain      Call MD for: Temperature > 38.5 Celsius ( > 101.3 Fahrenheit)      Discharge instructions          Appointments which have been scheduled for you      Apr 23, 2024 2:00 PM  (Arrive by 1:45 PM)  OFFICE VISIT with [facility] FOLLOW-UP  [facility] SAME DAY CLINIC [place] [facility] REGION) [address]  [facility] [address]  [place] [address]  [phone]  Apr 29, 2024 10:00 AM  (Arrive by 9:30 AM)  NEW  GENERAL with Gerard Levorn Gaskins, MD  [facility] VIR CLINIC [place] [address] [place] [facility] REGION) [address]  [place] [address]  [phone]        Apr 29, 2024 3:15 PM  (Arrive by 2:55 PM)  RETURN GENERAL with Oliva Sprinkles, MD  [facility] GENERAL AND BARIATRIC SURGERY [facility] REGION) [address]  [address]  [place] [address]  [phone]        May 16, 2024 3:00 PM  (Arrive by 2:45 PM)  RETURN ENDOCRINE with Ponce Arnulfo Kid, MD  [facility] REGION) [address] [place] [address]  [place] [address]  [phone]             ______________________________________________________________________  Discharge Day Services:  BP 145/76  - Pulse 77  - Temp 36.3 ??C (97.3 ??F) (Oral)  - Resp 16  - Ht 177.8 cm (5' 10)  - Wt (!) 123.9 kg (273 lb 1.6 oz)  - SpO2 96%  - BMI 39.19 kg/m??     Pt seen on the day of discharge and determined appropriate for discharge.    Condition at Discharge: fair    Length of Discharge: I spent less than 30 mins in the discharge of this patient.

## 2024-04-18 NOTE — Plan of Care (Signed)
 Shift Summary  Correctional insulin  was administered after a high blood glucose reading of 381 mg/dL, and the provider was notified.   Skin integrity was supported with frequent repositioning and use of positioning devices, though scattered bruising was observed.   Fall prevention strategies were maintained throughout the shift, including hourly checks and bed safety measures.   CHG wipes and infection prevention protocols were consistently followed.   Overall, the patient remained alert, oriented, and cooperative, with no pain reported during the shift.     Absence of Hospital-Acquired Illness or Injury: Aseptic technique and cohorting were maintained throughout the shift, and CHG wipes were used regularly; no new hospital-acquired issues were documented.     Skin Health and Integrity: Braden Scale scores remained high and stable, and frequent repositioning with supports was performed; scattered bruising was noted but skin turgor was non-tenting.     Absence of Fall and Fall-Related Injury: Fall reduction interventions, hourly visual checks, and bed safety measures were consistently implemented, with no falls or injuries reported.     Optimal Coping: Mood and psychosocial status were calm, cooperative, and within defined limits during the shift.     Optimal Pain Control and Function: Pain was consistently assessed as 0, and no pain interventions were required.

## 2024-04-18 NOTE — Consults (Signed)
 Tacrolimus  Therapeutic Monitoring Pharmacy Note    Richard [name] is a 58 y.o. male continuing tacrolimus .     Indication: Liver transplant     Date of Transplant: 2013      Prior Dosing Information: Home regimen 0.5mg  BID     Source(s) of information used to determine prior to admission dosing: Home Medication List    Goals:  Therapeutic Drug Levels  Tacrolimus  trough goal: 2-4    Additional Clinical Monitoring/Outcomes  Monitor renal function (SCr and urine output) and liver function (LFTs)  Monitor for signs/symptoms of adverse events (e.g., hyperglycemia, hyperkalemia, hypomagnesemia, hypertension, headache, tremor)    Previous Lab Values  Tacrolimus , Trough   Date/Time Value Ref Range Status   04/17/2024 06:13 AM 3.2 (L) 5.0 - 15.0 ng/mL Final   04/01/2024 06:35 AM 4.5 (L) 5.0 - 15.0 ng/mL Final   02/03/2024 04:50 AM 6.2 5.0 - 15.0 ng/mL Final   02/02/2024 08:34 AM 5.2 5.0 - 15.0 ng/mL Final   02/01/2024 04:19 AM 6.4 5.0 - 15.0 ng/mL Final   10/06/2014 09:00 AM 6.8 <=20.0 ng/mL Final   08/31/2014 08:34 AM 4.2  Final   08/03/2014 08:22 AM 4.7  Final   07/07/2014 08:37 AM 4.2  Final   06/09/2014 08:27 AM 3.5  Final     Tacrolimus , Timed   Date/Time Value Ref Range Status   04/08/2024 08:28 AM 6.8 ng/mL Final       Result:  Tacrolimus  level from today was drawn appropriately     Pharmacokinetic Considerations and Significant Drug Interactions:  Concurrent CYP3A4 substrates/inhibitors: None identified    Assessment/Plan:  Recommendedation(s)  Continue current regimen of 0.5mg  BID    Follow-up  No further levels indicated at this time.   A pharmacist will continue to monitor and recommend levels as appropriate    Please page service pharmacist with questions/clarifications.    Richard [name] K Arnie Clingenpeel, PharmD

## 2024-04-18 NOTE — Consults (Signed)
 Nephrology ESKD Follow-up Consult Note    Assessment/Recommendations: Richard [name] is a 58 y.o. male with a past medical history notable for ESKD on in-center hemodialysis admitted with Hyperglycemia.     # ESKD:  - No indications for urgent/emergent HD today based upon volume status, electrolytes or acidemia. We will continue hemodialysis thrice weekly. Please see orders for most up to date prescription.  - Volume: Will attempt to achieve dry weight if tolerated  - Medication list currently appropriate  - Vascular Access functioning well- no indication for intervention.  Current dialysis Rx:   EDW:116 kg (255 lb 11.7 oz)     Dialyzer:F-180 (98 mLs)   Bath:2 K+ / 2.5 Ca+  ;  Na:137 mEq/L  ;  CO2:35 mEq/L   Recent labs:  Na/K/Cl/CO2:137/4.8/97*/25.3 (10/24 9340)    # Anemia of Chronic Kidney Disease:   - Will continue outpatient management with ESA and adjust as indicated. No current need for transfusion.  Lab Results   Component Value Date    HGB 11.0 (L) 04/18/2024    HGB 10.9 (L) 04/17/2024    HGB 11.1 (L) 04/16/2024     Lab Results   Component Value Date    LABIRON 36 11/09/2022    TIBC 267.1 11/09/2022    FERRITIN 169.8 11/09/2022         # Hyperglycemia   - Reviewed primary team's daily progress note, DC today  - Evaluation and management per primary team  - No changes to management from a nephrology standpoint at this time    Discussed recommendations with primary team.  Thanks for this consult, we will continue to follow along and provide recommendations as needed.    *Please contact Nephrology PRIOR to hospital discharge so we can assist the primary team and ensure appropriate dialysis related follow-up (ie scheduling, antibiotics, changes in EDW and dialysis access, etc.).Richard [name] Steffan JONETTA Luke, MD  04/18/2024 1:53 PM   Medical decision-making for 04/18/24  Findings / Data     Patient has: []  acute illness w/systemic sxs  [mod]  []  two or more stable chronic illnesses [mod]  []  one chronic illness with acute exacerbation [mod]  []  acute complicated illness  [mod]  []  Undiagnosed new problem with uncertain prognosis  [mod] [x]  illness posing risk to life or bodily function (ex. AKI)  [high]  []  chronic illness with severe exacerbation/progression  [high]  []  chronic illness with severe side effects of treatment  [high] ESKD on RRT Probs At least 2:  Probs, Data, Risk   I reviewed: [x]  primary team note  []  consultant note(s)  []  external records [x]  chemistry results  [x]  CBC results  []  blood gas results  []  Other []  procedure/op note(s)   []  radiology report(s)  []  micro result(s)  []  w/ independent historian(s) Hb addressed above, No hyperK req RRT, per primary DC today >=3 Data Review (2 of 3)    I independently interpreted: []  Urine Sediment  []  Renal US  []  CXR Images  []  CT Images  []  Other []  EKG Tracing  Any     I discussed: []  Pathology results w/ QHPs(s) from other specialties  []  Procedural findings w/ QHPs(s) from other specialties []  Imaging w/ QHP(s) from other specialties  [x]  Treatment plan w/ QHP(s) from other specialties Plan discussed with primary team Any     Mgm't requires: []  Prescription drug(s)  [mod]  []  Kidney biopsy  [mod]  []  Central line placement  [mod] []  High  risk medication use and/or intensive toxicity monitoring [high]  [x]  Renal replacement therapy [high]  []  High risk kidney biopsy  [high]  []  Escalation of care  [high]  []  High risk central line placement  [high] RRT: High risk of complications from RRT requiring intensive monitoring Risk      _____________________________________________________________________________________    Subjective/Interval Events:  Patient doing well tired after HD yesterday otherwise no complaints    Physical Exam:  Vitals:    04/17/24 1708 04/17/24 1720 04/17/24 2339 04/18/24 0838   BP: 114/68 129/73 131/68 132/70   Pulse: 77 75 77 75   Resp: 18 17 18 16    Temp:  36.6 ??C (97.8 ??F) 36.3 ??C (97.4 ??F) 36.6 ??C (97.8 ??F)   TempSrc:  Oral Temporal Temporal   SpO2:   96% 97%   Weight:       Height:         No intake/output data recorded.    Intake/Output Summary (Last 24 hours) at 04/18/2024 1353  Last data filed at 04/17/2024 2030  Gross per 24 hour   Intake 240 ml   Output 4920 ml   Net -4680 ml     General: well-appearing, no acute distress  CV: RR  Lungs: normal work of breathing  Ext: no edema

## 2024-04-25 MED FILL — DEXCOM G7 SENSOR DEVICE: ORAL | 30 days supply | Qty: 3 | Fill #2

## 2024-04-29 ENCOUNTER — Ambulatory Visit
Admit: 2024-04-29 | Discharge: 2024-04-30 | Payer: BLUE CROSS/BLUE SHIELD | Attending: Student in an Organized Health Care Education/Training Program | Primary: Student in an Organized Health Care Education/Training Program

## 2024-04-29 NOTE — Progress Notes (Signed)
 Letter sent to [employer] via epic fax. Fax # [phone].

## 2024-04-29 NOTE — Progress Notes (Signed)
 Patient Name: Richard [name]  Patient Age: 58 y.o.  Encounter Date: 04/29/24   Attending Interventional Radiologist: Dr. Gerard Gaskins  Resident Interventional Radiologist: Gerard Levorn Gaskins, MD    Referring Physician: Gaskins Gerard Levorn, MD  [address]  [facility]   [id number]  [place],  [place] [address]  Primary Care Provider: Jama Mayo, MD      SUBJECTIVE      Reason for Visit: Splenic artery embolization       History of Present Illness: Richard [name] is a 58 y.o. male who is seen in consultation at the request of Dr. Vena Deutsch-Link for evaluation of splenic artery embolization. 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 He was hospitalized in July 2025 for encephalopathy. CT abdomen pelvis during that admission demonstrated a large portosystemic shunt draining via the right adrenal vein as well as a 2.6 cm partially thrombosed hilar splenic artery aneurysm. He has stabilized from an encephalopathy standpoint (thought to be multifactorial and largely related to uremia) and is no longer requiring lactulose . He presents today for evaluation for embolization of his hilar splenic artery aneurysm.     He endorses occasional epigastric pain, which is remote form his splenic hilar aneurysm and likely unrelated. Denies left upper quadrant pain. His aneurysm has been stable in size since 2017. He presents today in his usual state of health - no nausea, vomiting, fever, chills, chest pain, or shortness of breath.       Number and Complexity of Problems Addressed: 1 or more chronic illnesses with exacerbation, progression, or side effects of treatment      Past Medical History:  Past Medical History[1]    Past Surgical History:  Past Surgical History[2]    Family History:  Family History[3]     Allergies:  Allergies[4]     Anticoagulant/Antiplatelet Medications: Eliquis  (apixaban )    OBJECTIVE     Physical Exam:  Vitals:    04/29/24 0941   BP: 160/96   Pulse: 71   Resp: 14   Temp: 37 ??C (98.6 ??F)   SpO2: 95%     Body mass index is 39.17 kg/m??.    General: alert, in no acute distress  HEENT: normocephalic, atraumatic, anicteric sclera  Respiratory: breathing comfortably on room air  Cardiovascular: regular rate and rhythm  GI: soft, nontender to palpation  Skin: no visible rash, no jaundice  Neurologic: alert & oriented x3, no asterixis     Point-of-care US  performed: Not performed    Pertinent Laboratory Values:  Lab Results   Component Value Date    WBC 4.7 04/18/2024    HGB 11.0 (L) 04/18/2024    HCT 33.0 (L) 04/18/2024    PLT 179 04/18/2024     Lab Results   Component Value Date    NA 137 04/18/2024    K 4.8 04/18/2024    PHOS 5.1 04/18/2024    MG 2.1 04/18/2024     Lab Results   Component Value Date    BUN 20 04/18/2024    CREATININE 7.13 (H) 04/18/2024     Lab Results   Component Value Date    ALBUMIN 3.0 (L) 04/16/2024     Lab Results   Component Value Date    BILIDIR 0.40 (H) 04/08/2024     Lab Results   Component Value Date  AST 13 04/16/2024    ALT <7 (L) 04/16/2024    ALKPHOS 236 (H) 04/16/2024     Lab Results   Component Value Date    INR 1.63 02/03/2024       04/29/2024, labs were reviewed by me personally.  based on labs recommend New INR, CMP and CBC, within 30 days of procedure, per divisional guidelines.    Pertinent Imaging Studies: 04/29/2024, imaging was visualized and reviewed by me personally. Interpretation: partially thrombosed aneurysm at the splenic hilum, and based on imaging recommend embolization.    Calcified and partially thrombosed aneurysm at the splenic hilum measuring 20 x 26 mm, which is grossly stable in size compared to outside CT from 2017.         Amount and/or Complexity of Data: High : (Must meet at all three listed below)    ASA Score: ASA 2 - Patient with mild systemic disease with no functional limitations    Performance Status: 0 = Fully active, able to carry on all pre-disease performance without restriction    ASSESSMENT/PLAN     Richard [name] is a 58 y.o. 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 Although the aneurysm has been stable in size since at least 2017, given his history of liver transplant and known portal hypertension, this should be embolized due to the risk of spontaneous rupture.     We briefly discussed his large gastrocaval shunt, however his encephalopathy is well controlled at this time. In the event that he develops refractory encephalopathy in the future, we can revisit embolizing the shunt.     I discussed the various treatment options available. At this time, we feel that he would benefit most from splenic artery embolization The risks, benefits and alternatives were fully discussed including bleeding, infection, non-target embolization , access site bleeding, post-operative pain , post embolization syndrome , necrosis , splenic infarction, splenic abscess, and damage to adjacent structures/organs. The patient's questions were all answered to his satisfaction.       -- Procedure will be scheduled at the earliest mutually available date & time  -- Anticoagulant medication hold required: Eliquis  - withhold 4 doses (CrCl>70mL/min) or 6 doses (CrCl<30-53mL/min), per SIR guidelines.  -- Anticoagulation can be resumed: 24 hours following procedure  -- Antibiotics required: Yes- surgical prophylaxis    -- Discharge medications:Yes- amoxicillin/clavulanate 500mg  BID  --Planned level of sedation: General anesthesia - reason: comorbidities and patient preference  --Planned access: right groin (arterial), Positioning: supine  --Labs needed: Yes-per divisional guidelines, the patient needs new INR, CMP and CBC, 30 days before the procedure  --Pre-procedural medications required: Yes-  weight based ancef   --Pre-US  needed: Yes-                                          -- Notes reviewed:Yes- progress note of Dr. Charlett stating, his encephalopathy is well controlled at this time    --Follow up- [facility] clinic visit in 2 months, with Dr. Gretta with multiphase CT.  --      Informed consent obtained:   Yes---This procedure has been fully reviewed with the patient/patient's authorized representative. The risks, benefits and alternatives have been explained, and the patient/patient's authorized representative  has consented to the procedure.   --The patient will accept blood products in an emergent situation.   --The patient does not have a Do Not Resuscitate order in effect.        Gerard Levorn Gaskins, MD  04/29/24 9:54 AM              [1]   Past Medical History:  Diagnosis Date    CHF (congestive heart failure) (CMS-HCC)     COVID-19 07/18/2019    Diabetes mellitus (CMS-HCC)     Family history of malignant neoplasm of prostate 06/23/2015    Heart disease     Heart murmur     Hepatic cirrhosis    (CMS-HCC) 06/23/2015    Overview:  Secondary to NASH; followed by Dr. Lindaann, GI.  Last Assessment & Plan:  Relevant Hx: Course: Daily Update: Today's Plan:     Hypertension     Hypogonadism in male 02/12/2014    Kidney stone     Liver cirrhosis secondary to NASH (nonalcoholic steatohepatitis) (CMS-HCC)     s/p liver transplant 2013    Obesity    [2]   Past Surgical History:  Procedure Laterality Date    CHOLECYSTECTOMY      liver transpant      LIVER TRANSPLANTATION  06/27/2011    PR ANASTOMOSIS,AV,ANY SITE Right 02/20/2024    Procedure: ARTERIOVENOUS ANASTOMOSIS, OPEN; DIRECT, ANY SITE, UPPER EXTREMITY;  Surgeon: Marchelle Kitchens, MD;  [facility];  Service: General Surgery    PR AV ANAST,UP ARM BASILIC VEIN TRANSPOSIT Right 03/31/2024    Procedure: ARTERIOVENOUS ANASTOMOSIS, OPEN; BY UPPER ARM BASILIC VEIN TRANSPOSITION;  Surgeon: Marchelle Kitchens, MD;  Location: [facility];  Service: General Surgery    PR AV FIST REVISE GRFT,W THROMBECTOMY Right 01/15/2023    Procedure: REVISION, ARTERIOVENOUS FISTULA W/ THROMBECTOMY, AUTOGENOUS OR NONAUTOGENOUS DIALYSIS GRAFT (SEP. PROC), LOWER EXTREMITY;  Surgeon: Marchelle Kitchens, MD;  Location: [facility] OR [facility];  Service: General Surgery    PR COLONOSCOPY FLX DX W/COLLJ SPEC WHEN PFRMD N/A 01/24/2024    Procedure: COLONOSCOPY, FLEXIBLE, PROXIMAL TO SPLENIC FLEXURE; DIAGNOSTIC, W/WO COLLECTION SPECIMEN BY BRUSH OR WASH;  Surgeon: Erick Prentice HERO, MD;  Location: [facility];  Service: Gastroenterology    PR COLONOSCOPY W/BIOPSY SINGLE/MULTIPLE N/A 08/13/2018    Procedure: COLONOSCOPY, FLEXIBLE, PROXIMAL TO SPLENIC FLEXURE; WITH BIOPSY, SINGLE OR MULTIPLE;  Surgeon: Thedora Alm Plain, MD;  Location: [facility];  Service: Gastroenterology    PR COLSC FLX W/RMVL OF TUMOR POLYP LESION SNARE TQ N/A 08/13/2018    Procedure: COLONOSCOPY FLEX; W/REMOV TUMOR/LES BY SNARE;  Surgeon: Thedora Alm Plain, MD;  Location: [facility];  Service: Gastroenterology    PR CREAT AV FISTULA,NON-AUTOGENOUS GRAFT Right 11/29/2022    Procedure: CREATE AV FISTULA (SEPARATE PROC); NONAUTOGENOUS GRAFT (EG, BIOLOGICAL COLLAGEN, THERMOPLASTIC GRAFT), UPPER EXTREMITY;  Surgeon: Marchelle Kitchens, MD;  Location: [facility] OR [facility];  Service: General Surgery    PR UPPER GI ENDOSCOPY,BIOPSY N/A 07/13/2015    Procedure: UGI ENDOSCOPY; WITH BIOPSY, SINGLE OR MULTIPLE;  Surgeon: Elspeth Jerilynn Reek, MD;  Location: [facility];  Service: Gastroenterology    PR UPPER GI ENDOSCOPY,BIOPSY N/A 02/13/2023    Procedure: UGI ENDOSCOPY; WITH BIOPSY, SINGLE OR MULTIPLE;  Surgeon: Minnie Krystal Claude, MD;  Location: [facility];  Service: Gastroenterology    PR UPPER GI ENDOSCOPY,DIAGNOSIS N/A 01/24/2024    Procedure: UGI ENDO, INCLUDE ESOPHAGUS, STOMACH, & DUODENUM &/  OR JEJUNUM; DX W/WO COLLECTION SPECIMN, BY BRUSH OR WASH;  Surgeon: Erick Prentice HERO, MD;  Location: [facility];  Service: Gastroenterology   [3]   Family History  Problem Relation Age of Onset    Diabetes Father     Diabetes Paternal Grandfather     Liver disease Maternal Uncle     Cancer Maternal Grandmother     Melanoma Neg Hx     Basal cell carcinoma Neg Hx     Squamous cell carcinoma Neg Hx     Kidney disease Neg Hx    [4] No Known Allergies

## 2024-04-29 NOTE — Progress Notes (Signed)
 Patient arrived at the clinic today for splenic artery aneurysm consultation with the VIR provider Dr. Gretta, (MD). Upon assessment, the patient's vital signs were stable (VSS), and the patient was alert and oriented x4. Allergies and home medications were reviewed and documented accurately. A consent form is available at the bedside. If the patient decides to proceed with the procedure recommended by the VIR provider, the RN will verify and sign the consent, which will then be scanned into the patient's chart. The RN provided the patient with education and reading materials related to the procedure to ensure the patient is informed and prepared. All questions addressed accordingly. The clinic contact number was offered to the patient for any future inquiries. An After Visit Summary (AVS) was printed and offered to the patient for their reference.

## 2024-04-29 NOTE — Progress Notes (Signed)
 Patient returns for 2 week follow up visit.  He is s/p revision of arteriovenous fistula right arm with basilic vein transposition on 03/31/2024. He complains of pain of the right arm which he rates at 7/10. He is also numb in some places on his arm.

## 2024-05-02 NOTE — Progress Notes (Signed)
 [facility] Refill Coordination Note    Specialty Medication(s) to be Shipped:   Transplant: mycophenolate  mofetil 250mg  and Prograf  0.5mg     Other medication(s) to be shipped: gabapentin  and Mounjaro     Specialty Medications not needed at this time: N/A     Richard [name], DOB: 11/18/1965  Phone: There are no phone numbers on file.      All above HIPAA information was verified with patient.     Was a nurse, learning disability used for this call? No    Completed refill call assessment today to schedule patient's medication shipment from the [name]  [phone]).  All relevant notes have been reviewed.     Specialty medication(s) and dose(s) confirmed: Regimen is correct and unchanged.   Changes to medications: Richard [name] reports no changes at this time.  Changes to insurance: No  New side effects reported not previously addressed with a pharmacist or physician: None reported  Questions for the pharmacist: No    Confirmed patient received a Conservation Officer, Historic Buildings and a Surveyor, Mining with first shipment. The patient will receive a drug information handout for each medication shipped and additional FDA Medication Guides as required.       DISEASE/MEDICATION-SPECIFIC INFORMATION        N/A    SPECIALTY MEDICATION ADHERENCE     Medication Adherence    Patient reported X missed doses in the last month: 0  Specialty Medication: mycophenolate  250 mg capsule (CELLCEPT )  Patient is on additional specialty medications: Yes  Additional Specialty Medications: tacrolimus : PROGRAF  0.5 mg capsule  Patient Reported Additional Medication X Missed Doses in the Last Month: 0  Patient is on more than two specialty medications: No  Any gaps in refill history greater than 2 weeks in the last 3 months: no  Demonstrates understanding of importance of adherence: yes  Informant: patient  Adherence tools used: patient uses a pill box to manage medications  Confirmed plan for next specialty medication refill: delivery by pharmacy  Refills needed for supportive medications: not needed          Refill Coordination    Has the Patients' Contact Information Changed: No  Is the Shipping Address Different: No         Were doses missed due to medication being on hold? No    mycophenolate  250  mg: 5 days of medicine on hand   tacrolimus : PROGRAF  0.5 mg: 5 days of medicine on hand       REFERRAL TO PHARMACIST     Referral to the pharmacist: Not needed      Richard [name]     Shipping address confirmed in Epic.     Cost and Payment: Patient has a $0 copay, payment information is not required.    Delivery Scheduled: Yes, Expected medication delivery date: 05/08/24.     Medication will be delivered via UPS to the prescription address in Epic WAM.    Richard [name]   [name]  Specialty Technician

## 2024-05-05 NOTE — Progress Notes (Signed)
 He is s/p basilic vein transposition of the right arm  He is doing well other than some residual complaints of pain   There is no distal ischemia and edema has resolved  The incision is fully healed and I removed the remaining sutures  There is no recurrence of the seroma  There is a brisk thrill over the fistula  It is still a bit early to cannulate the new fistula  I have given instructions about timing of first cannulation and suggest using 1 needle before progressing to 2  He will return prn

## 2024-05-07 MED FILL — MOUNJARO 15 MG/0.5 ML SUBCUTANEOUS PEN INJECTOR: SUBCUTANEOUS | 28 days supply | Qty: 2 | Fill #2

## 2024-05-07 MED FILL — GABAPENTIN 300 MG CAPSULE: ORAL | 30 days supply | Qty: 30 | Fill #6

## 2024-05-07 MED FILL — MYCOPHENOLATE MOFETIL 250 MG CAPSULE: ORAL | 30 days supply | Qty: 60 | Fill #3

## 2024-05-13 DIAGNOSIS — N186 End stage renal disease: Principal | ICD-10-CM

## 2024-05-13 NOTE — Telephone Encounter (Signed)
 Phone call to schedule patient  Procedure:DIALYSIS CVC exchange- low flows with GA    Date: 05/20/24  Time: 1220pm         check In:1120am  Location:[facility] [address] Surgical building    NPO 8 hours prior of food and/ or tube feeds. Clear liquids 2 hours prior  Must have adult driver come and stay to drive them home  Date, time, location, and insurance confirmed.

## 2024-05-15 NOTE — Telephone Encounter (Signed)
 VIR pre call attempt.  LM for call back.

## 2024-05-15 NOTE — Telephone Encounter (Signed)
 VIR pre HD cath exchange prep call completed. Reviewed to register at 1120 on ground floor of [facility] then [facility].  Informed of no show/late cancellation policy. NPO guidelines reviewed. Pt OK to take sips of clear liquids with all AM meds.  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 Made aware of visitation policy.  Pt verbalized understanding. All questions answered.     Diabetic medication guidelines reviewed.  Currently taking Mounjaro .

## 2024-05-20 ENCOUNTER — Encounter: Admit: 2024-05-20 | Discharge: 2024-05-24 | Payer: BLUE CROSS/BLUE SHIELD

## 2024-05-20 ENCOUNTER — Inpatient Hospital Stay: Admit: 2024-05-20 | Discharge: 2024-05-24 | Payer: BLUE CROSS/BLUE SHIELD

## 2024-05-20 LAB — COMPREHENSIVE METABOLIC PANEL
ALBUMIN: 3.2 g/dL — ABNORMAL LOW (ref 3.4–5.0)
ALKALINE PHOSPHATASE: 186 U/L — ABNORMAL HIGH (ref 46–116)
ALT (SGPT): 39 U/L (ref 10–49)
ANION GAP: 18 mmol/L — ABNORMAL HIGH (ref 5–14)
AST (SGOT): 88 U/L — ABNORMAL HIGH (ref <=34)
BILIRUBIN TOTAL: 0.5 mg/dL (ref 0.3–1.2)
BLOOD UREA NITROGEN: 38 mg/dL — ABNORMAL HIGH (ref 9–23)
BUN / CREAT RATIO: 3
CALCIUM: 9 mg/dL (ref 8.7–10.4)
CHLORIDE: 97 mmol/L — ABNORMAL LOW (ref 98–107)
CO2: 22 mmol/L (ref 20.0–31.0)
CREATININE: 12 mg/dL — ABNORMAL HIGH (ref 0.73–1.18)
EGFR CKD-EPI (2021) MALE: 4 mL/min/1.73m2 — ABNORMAL LOW (ref >=60)
GLUCOSE RANDOM: 219 mg/dL — ABNORMAL HIGH (ref 70–179)
POTASSIUM: 5 mmol/L — ABNORMAL HIGH (ref 3.4–4.8)
PROTEIN TOTAL: 7 g/dL (ref 5.7–8.2)
SODIUM: 137 mmol/L (ref 135–145)

## 2024-05-20 MED ADMIN — phenylephrine 1 mg/10 mL (100 mcg/mL) injection Syrg: INTRAVENOUS | @ 20:00:00 | Stop: 2024-05-20

## 2024-05-20 MED ADMIN — phenylephrine 1 mg/10 mL (100 mcg/mL) injection Syrg: INTRAVENOUS | @ 21:00:00 | Stop: 2024-05-20

## 2024-05-20 MED ADMIN — ePHEDrine (PF) 25 mg/5 mL (5 mg/mL) in 0.9% sodium chloride syringe: INTRAVENOUS | @ 21:00:00 | Stop: 2024-05-20

## 2024-05-20 MED ADMIN — ePHEDrine (PF) 25 mg/5 mL (5 mg/mL) in 0.9% sodium chloride syringe: INTRAVENOUS | @ 20:00:00 | Stop: 2024-05-20

## 2024-05-20 MED ADMIN — dexmedeTOMIDine (Precedex) injection: INTRAVENOUS | @ 20:00:00 | Stop: 2024-05-20

## 2024-05-20 MED ADMIN — dexmedeTOMIDine (Precedex) injection: INTRAVENOUS | @ 21:00:00 | Stop: 2024-05-20

## 2024-05-20 MED ADMIN — fentaNYL (PF) (SUBLIMAZE) injection 25 mcg: 25 ug | INTRAVENOUS | @ 23:00:00 | Stop: 2024-05-20

## 2024-05-20 MED ADMIN — fentaNYL (PF) (SUBLIMAZE) injection 25 mcg: 25 ug | INTRAVENOUS | @ 22:00:00 | Stop: 2024-05-20

## 2024-05-20 MED ADMIN — heparin (porcine) 1000 unit/mL injection: INTRAVENOUS | @ 21:00:00 | Stop: 2024-05-20

## 2024-05-20 MED ADMIN — Propofol (DIPRIVAN) injection: INTRAVENOUS | @ 20:00:00 | Stop: 2024-05-20

## 2024-05-20 MED ADMIN — propofol (DIPRIVAN) infusion 10 mg/mL: INTRAVENOUS | @ 20:00:00 | Stop: 2024-05-20

## 2024-05-20 MED ADMIN — iohexol (OMNIPAQUE) 300 mg iodine/mL solution 30 mL: 30 mL | INTRAVENOUS | @ 22:00:00 | Stop: 2024-05-20

## 2024-05-20 MED ADMIN — ondansetron (ZOFRAN) injection: INTRAVENOUS | @ 21:00:00 | Stop: 2024-05-20

## 2024-05-20 MED ADMIN — ceFAZolin (ANCEF) injection: INTRAVENOUS | @ 21:00:00 | Stop: 2024-05-20

## 2024-05-20 MED ADMIN — dexAMETHasone (DECADRON) 4 mg/mL injection: INTRAVENOUS | @ 21:00:00 | Stop: 2024-05-20

## 2024-05-20 MED ADMIN — lactated Ringers infusion: INTRAVENOUS | @ 20:00:00 | Stop: 2024-05-20

## 2024-05-20 MED ADMIN — midazolam (VERSED) injection: INTRAVENOUS | @ 20:00:00 | Stop: 2024-05-20

## 2024-05-20 MED ADMIN — lidocaine (PF) (XYLOCAINE-MPF) 20 mg/mL (2 %) injection: INTRAVENOUS | @ 20:00:00 | Stop: 2024-05-20

## 2024-05-20 NOTE — H&P (Signed)
 Assessment/Plan:    [name] is a 58 y.o. male who will undergo tunneled central venous catheter exchange and possible fibrin sheath disruption in Interventional Radiology.    --This procedure has been fully reviewed with the patient/patient???s authorized representative. The risks, benefits and alternatives have been explained, and the patient/patient???s authorized representative has consented to the procedure.  --The patient will accept blood products in an emergent situation.  --The patient does not have a Do Not Resuscitate order in effect.    HPI: [name] is a 58 y.o. male with ESRD on iHD via LIJ tunneled hemodialysis catheter. He has been having low flows during dialysis and presents for catheter evaluation and exchange with possible fibrin sheath disruption. Patient last had catheter exchange and fibrin sheath disruption on 04/02/2024. Patient had AVF revision with basilic vein transposition in early October and should hopefully be able to use his fistula soon.    Allergies: Allergies[1]    Medications:  No relevant medications, please see full medication list in Epic.    PSH: Past Surgical History[2]    PMH: Past Medical History[3]    PE:    Vitals:    05/20/24 1054   BP: 149/93   Pulse: 79   Temp: 36.6 ??C (97.9 ??F)   SpO2: 95%     General: WD, WN male in NAD.   HEENT: Normocephalic, atraumatic.   Lungs: Respirations nonlabored    Arleta Render, MD   05/20/2024, 2:47 PM              [1] No Known Allergies  [2]   Past Surgical History:  Procedure Laterality Date    CHOLECYSTECTOMY      liver transpant      LIVER TRANSPLANTATION  06/27/2011    PR ANASTOMOSIS,AV,ANY SITE Right 02/20/2024    Procedure: ARTERIOVENOUS ANASTOMOSIS, OPEN; DIRECT, ANY SITE, UPPER EXTREMITY;  Surgeon: Marchelle Kitchens, MD;  [facility]: [facility] OR [facility];  Service: General Surgery    PR AV ANAST,UP ARM BASILIC VEIN TRANSPOSIT Right 03/31/2024    Procedure: ARTERIOVENOUS ANASTOMOSIS, OPEN; BY UPPER ARM BASILIC VEIN TRANSPOSITION;  Surgeon: Marchelle Kitchens, MD;  Location: [facility] OR [facility];  Service: General Surgery    PR AV FIST REVISE GRFT,W THROMBECTOMY Right 01/15/2023    Procedure: REVISION, ARTERIOVENOUS FISTULA W/ THROMBECTOMY, AUTOGENOUS OR NONAUTOGENOUS DIALYSIS GRAFT (SEP. PROC), LOWER EXTREMITY;  Surgeon: Marchelle Kitchens, MD;  Location: [facility] OR [facility];  Service: General Surgery    PR COLONOSCOPY FLX DX W/COLLJ SPEC WHEN PFRMD N/A 01/24/2024    Procedure: COLONOSCOPY, FLEXIBLE, PROXIMAL TO SPLENIC FLEXURE; DIAGNOSTIC, W/WO COLLECTION SPECIMEN BY BRUSH OR WASH;  Surgeon: Erick Prentice HERO, MD;  [facility]: [facility];  Service: Gastroenterology    PR COLONOSCOPY W/BIOPSY SINGLE/MULTIPLE N/A 08/13/2018    Procedure: COLONOSCOPY, FLEXIBLE, PROXIMAL TO SPLENIC FLEXURE; WITH BIOPSY, SINGLE OR MULTIPLE;  Surgeon: Thedora Alm Plain, MD;  Location: [facility];  Service: Gastroenterology    PR COLSC FLX W/RMVL OF TUMOR POLYP LESION SNARE TQ N/A 08/13/2018    Procedure: COLONOSCOPY FLEX; W/REMOV TUMOR/LES BY SNARE;  Surgeon: Thedora Alm Plain, MD;  Location: [facility];  Service: Gastroenterology    PR CREAT AV FISTULA,NON-AUTOGENOUS GRAFT Right 11/29/2022    Procedure: CREATE AV FISTULA (SEPARATE PROC); NONAUTOGENOUS GRAFT (EG, BIOLOGICAL COLLAGEN, THERMOPLASTIC GRAFT), UPPER EXTREMITY;  Surgeon: Marchelle Kitchens, MD;  Location: [facility] OR [facility];  Service: General Surgery    PR UPPER GI ENDOSCOPY,BIOPSY N/A 07/13/2015    Procedure:  UGI ENDOSCOPY; WITH BIOPSY, SINGLE OR MULTIPLE;  Surgeon: Elspeth Jerilynn Reek, MD;  Location: [facility];  Service: Gastroenterology    PR UPPER GI ENDOSCOPY,BIOPSY N/A 02/13/2023    Procedure: UGI ENDOSCOPY; WITH BIOPSY, SINGLE OR MULTIPLE;  Surgeon: Minnie Krystal Claude, MD;  Location: [facility];  Service: Gastroenterology    PR UPPER GI ENDOSCOPY,DIAGNOSIS N/A 01/24/2024    Procedure: UGI ENDO, INCLUDE ESOPHAGUS, STOMACH, & DUODENUM &/OR JEJUNUM; DX W/WO COLLECTION SPECIMN, BY BRUSH OR WASH;  Surgeon: Erick Prentice HERO, MD;  Location: [facility];  Service: Gastroenterology   [3]   Past Medical History:  Diagnosis Date    CHF (congestive heart failure) (CMS-HCC)     COVID-19 07/18/2019    Diabetes mellitus (CMS-HCC)     Family history of malignant neoplasm of prostate 06/23/2015    Heart disease     Heart murmur     Hepatic cirrhosis    (CMS-HCC) 06/23/2015    Overview:  Secondary to NASH; followed by Dr. Lindaann, GI.  Last Assessment & Plan:  Relevant Hx: Course: Daily Update: Today's Plan:     Hypertension     Hypogonadism in male 02/12/2014    Kidney stone     Liver cirrhosis secondary to NASH (nonalcoholic steatohepatitis) (CMS-HCC)     s/p liver transplant 2013    Obesity

## 2024-05-20 NOTE — Op Note (Cosign Needed)
 [facility] INTERVENTIONAL RADIOLOGY - Operative Note     VIR Post-Procedure Note    Procedure Name: Central venogram, balloon angioplasty, catheter exchange    Pre-Op Diagnosis: Low flows with dialysis    Post-Op Diagnosis: Same as pre-operative diagnosis    VIR Providers    Attending Physician: Arleta Render, MD    Fellow/Resident: Buena ONEIDA Pouch, MD    Time out: Prior to the procedure, a time out was performed with all team members present. During the time out, the patient, procedure and procedure site when applicable were verbally verified by the team members, Arleta Render, MD and Buena ONEIDA Pouch, MD.    Description of procedure:   Initial evaluation demonstrated a slightly retracted catheter. Pullback venogram demonstrated no discrete fibrin sheath. Out of abudance of caution, central balloon angiogplasty was performed to 12mm. The catheter was exchanged over the wire and positioned deeper within the right atrium than the prior catheter. It aspirated and flushed freely and was locked with 2.1cc of 1000u heparin  in each lumen.     Sedation:General Anesthesia    Estimated Blood Loss: approximately 30 mL  Complications: None    See detailed procedure note with images in [facility]).    The patient tolerated the procedure well without incident or complication and left the room in stable condition.    Plan:  --line ready to use    Buena ONEIDA Pouch, MD  05/20/2024 4:56 PM

## 2024-05-21 NOTE — Telephone Encounter (Signed)
 Post procedure phone call completed. Patient states he is doing well. Advised to call with any questions or concerns.

## 2024-05-25 ENCOUNTER — Inpatient Hospital Stay
Admission: EM | Admit: 2024-05-25 | Discharge: 2024-05-28 | Disposition: A | Discharge status: Home / Self Care | Payer: BLUE CROSS/BLUE SHIELD | Admitting: Internal Medicine

## 2024-05-25 ENCOUNTER — Ambulatory Visit
Admission: EM | Admit: 2024-05-25 | Discharge: 2024-05-28 | Disposition: A | Discharge status: Home / Self Care | Payer: BLUE CROSS/BLUE SHIELD | Admitting: Internal Medicine

## 2024-05-25 LAB — BLOOD GAS, VENOUS
BASE EXCESS VENOUS: -5.3 — ABNORMAL LOW (ref -2.0–2.0)
CARBOXYHEMOGLOBIN, VENOUS: 1.6 % — ABNORMAL HIGH (ref <1.2)
HCO3 VENOUS: 19 mmol/L — ABNORMAL LOW (ref 22–27)
METHEMOGLOBIN, VENOUS: <1 % (ref <1.5)
O2 SATURATION VENOUS: 84 % (ref 40.0–85.0)
OXYHEMOGLOBIN, VENOUS: 82.3 % (ref 40.0–85.0)
PCO2 VENOUS: 32 mmHg — ABNORMAL LOW (ref 40–60)
PH VENOUS: 7.39 (ref 7.32–7.43)
PO2 VENOUS: 54 mmHg (ref 30–55)

## 2024-05-25 LAB — BASIC METABOLIC PANEL
ANION GAP: 25 mmol/L — ABNORMAL HIGH (ref 5–14)
BLOOD UREA NITROGEN: 57 mg/dL — ABNORMAL HIGH (ref 9–23)
BUN / CREAT RATIO: 4
CALCIUM: 9.1 mg/dL (ref 8.7–10.4)
CHLORIDE: 95 mmol/L — ABNORMAL LOW (ref 98–107)
CO2: 19 mmol/L — ABNORMAL LOW (ref 20.0–31.0)
CREATININE: 14.53 mg/dL — ABNORMAL HIGH (ref 0.73–1.18)
EGFR CKD-EPI (2021) MALE: 4 mL/min/1.73m2 — ABNORMAL LOW (ref >=60)
GLUCOSE RANDOM: 196 mg/dL — ABNORMAL HIGH (ref 70–179)
POTASSIUM: 5.7 mmol/L — ABNORMAL HIGH (ref 3.4–4.8)
SODIUM: 139 mmol/L (ref 135–145)

## 2024-05-25 LAB — HEPATIC FUNCTION PANEL
ALBUMIN: 3.6 g/dL (ref 3.4–5.0)
ALKALINE PHOSPHATASE: 162 U/L — ABNORMAL HIGH (ref 46–116)
ALT (SGPT): <7 U/L — ABNORMAL LOW (ref 10–49)
AST (SGOT): 25 U/L (ref <=34)
BILIRUBIN DIRECT: 0.4 mg/dL — ABNORMAL HIGH (ref 0.00–0.30)
BILIRUBIN TOTAL: 1.2 mg/dL (ref 0.3–1.2)
PROTEIN TOTAL: 7.5 g/dL (ref 5.7–8.2)

## 2024-05-25 LAB — CBC W/ AUTO DIFF
BASOPHILS ABSOLUTE COUNT: 0 10*9/L (ref 0.0–0.1)
BASOPHILS RELATIVE PERCENT: 0.5 %
EOSINOPHILS ABSOLUTE COUNT: 0.1 10*9/L (ref 0.0–0.5)
EOSINOPHILS RELATIVE PERCENT: 1.2 %
HEMATOCRIT: 34.1 % — ABNORMAL LOW (ref 39.0–48.0)
HEMOGLOBIN: 11.6 g/dL — ABNORMAL LOW (ref 12.9–16.5)
LYMPHOCYTES ABSOLUTE COUNT: 0.6 10*9/L — ABNORMAL LOW (ref 1.1–3.6)
LYMPHOCYTES RELATIVE PERCENT: 9.5 %
MEAN CORPUSCULAR HEMOGLOBIN CONC: 34 g/dL (ref 32.0–36.0)
MEAN CORPUSCULAR HEMOGLOBIN: 28.6 pg (ref 25.9–32.4)
MEAN CORPUSCULAR VOLUME: 84 fL (ref 77.6–95.7)
MEAN PLATELET VOLUME: 8.1 fL (ref 6.8–10.7)
MONOCYTES ABSOLUTE COUNT: 0.3 10*9/L (ref 0.3–0.8)
MONOCYTES RELATIVE PERCENT: 4.6 %
NEUTROPHILS ABSOLUTE COUNT: 5.6 10*9/L (ref 1.8–7.8)
NEUTROPHILS RELATIVE PERCENT: 84.2 %
PLATELET COUNT: 210 10*9/L (ref 150–450)
RED BLOOD CELL COUNT: 4.06 10*12/L — ABNORMAL LOW (ref 4.26–5.60)
RED CELL DISTRIBUTION WIDTH: 14.3 % (ref 12.2–15.2)
WBC ADJUSTED: 6.6 10*9/L (ref 3.6–11.2)

## 2024-05-25 LAB — TSH: THYROID STIMULATING HORMONE: 1.696 u[IU]/mL (ref 0.550–4.780)

## 2024-05-25 LAB — PROTIME-INR
INR: 1.09
PROTIME: 12.4 s (ref 9.9–12.6)

## 2024-05-25 LAB — AMMONIA: AMMONIA: 89 umol/L — ABNORMAL HIGH (ref 11–32)

## 2024-05-25 LAB — MAGNESIUM: MAGNESIUM: 2.4 mg/dL (ref 1.6–2.6)

## 2024-05-25 LAB — PHOSPHORUS: PHOSPHORUS: 10.5 mg/dL — ABNORMAL HIGH (ref 2.4–5.1)

## 2024-05-25 NOTE — ED Notes (Signed)
.  Patient from triage escorted to room 22. Pt paced on cardiac monitor, continuous pulse oximeter and continuous blood pressure monitoring.      Pt awaiting to see the doctor.

## 2024-05-25 NOTE — ED Provider Notes (Addendum)
 Emergency Department Provider Note        ED Clinical Impression     Final diagnoses:   None       ED Assessment/Plan     58 y.o. 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  Here, he is quite altered, unable to answer questions and staring straight ahead.  He apparently missed dialysis yesterday, which is when his altered mental status started.  Reportedly he is adherent to his lactulose .  Assessment & Plan  Altered mental status in the setting of missed hemodialysis and cirrhosis   He exhibits altered mental status since missing dialysis yesterday, with symptoms of confusion, inattentiveness, and staring into space. Differential diagnosis includes hyperammonemia, CNS etiology, possible UTI, and DKA. Hepatic encephalopathy is a concern due to his liver failure. Labs have been ordered to assess for hyperammonemia and other metabolic causes. There is concern for hepatic encephalopathy contributing to his altered mental status. A bedside ultrasound was performed to rule out ascites / spontaneous bacterial peritonitis (SBP).    Hyperkalemia in the setting of ESRD on HD, missed HD yesterday  He has ESRD due to CNI toxicity, managed with intermittent hemodialysis. Missed dialysis session yesterday, now with K 5.7, pH wnl, phos 10.5.  EKG shows no T wave changes.  Will give lokelma  and contact nephrology, but do not feel that he needs emergent dialysis.      Update 7:57 PM  -- Ammonia 89. Suspect AMS in the setting of HE.  Will give lactulose .  -- Spoke with nephrology consultant who agrees that patient can be dialyzed tomorrow.  No shifting agents.  -- No pocket of ascites to perform diagnostic arthrocentesis. Will give empiric ceftriaxone   -- MAO paged at this time for admission for persistent altered mental status, likely from hepatic encephalopathy.    Despite initial evaluation, the etiology of altered mental status remains undetermined, and the patient is not yet back to baseline. Given ongoing altered mentation, need for close monitoring, and potential for serious underlying pathology, the patient will require hospital admission for further evaluation and management.        History     Chief Complaint   Patient presents with    Altered Mental Status     HPI  History of Present Illness  Richard [name] is a 58 y.o. 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 History is obtained from friend at the bedside.    On my interview, patient is quite disoriented and inattentive, staring in space and unable to answer any questions that I ask him    He has experienced altered mental status since missing his scheduled dialysis session yesterday. He has been described as 'super out of it and confused,' with a lack of awareness of his surroundings since last night. He was reportedly fine two days ago before missing dialysis.    He takes lactulose , which he last took on Friday, but did not take it yesterday or today. Despite this, he had a bowel movement today, which was similar to his usual pattern.    He has type 2 diabetes and takes medications such as Ozempic  or Mounjaro . He has not eaten or drunk anything today  or yesterday and has been awake for only about three hours during this period.    He reports urinating a little, but not a lot. He does not report pain or cough.            Past Medical History[1]    Past Surgical History[2]    Family History[3]    Social History[4]    Review of Systems    Physical Exam     BP 182/129  - Pulse 102  - Temp 37.2 ??C (99 ??F) (Oral)  - Resp 21  - SpO2 98%     Physical Exam  Vitals and nursing note reviewed. Constitutional:       General: He is not in acute distress.     Appearance: Normal appearance. He is well-developed. He is not toxic-appearing.   HENT:      Head: Normocephalic and atraumatic.   Eyes:      Extraocular Movements: Extraocular movements intact.   Pulmonary:      Effort: Pulmonary effort is normal.   Abdominal:      General: There is distension.      Palpations: Abdomen is soft.      Comments: Cirrhotic abdomen   Musculoskeletal:         General: Normal range of motion.      Cervical back: Normal range of motion.   Skin:     General: Skin is dry.      Coloration: Skin is not jaundiced or pale.      Comments: Mild jaundice   Neurological:      General: No focal deficit present.      Mental Status: He is disoriented and confused.      Motor: No abnormal muscle tone.      Coordination: Coordination normal.      Comments: Inattentive, disoriented, will stare into space and not answer my questions   Psychiatric:         Behavior: Behavior normal.         ED Course            Medical Decision Making  Amount and/or Complexity of Data Reviewed  Labs: ordered.  Radiology: ordered.  ECG/medicine tests: ordered.    Risk  Prescription drug management.  Decision regarding hospitalization.           ===== CONSOLIDATED BILLING DOCUMENTATION =====       DATA REVIEWED AND ANALYZED    Tests, Orders, and External Documentation:     I reviewed the patient's prior external notes from the following unique sources:    Discharge summary dated 10/24, in order to understand the patient's past medical history and provide context for today's presentation to the ED.       Unique labs Reviewed:   CBC w/ Differential: Mild anemia (Hgb 11.6), otherwise normal indices; no leukocytosis or thrombocytopenia.  Hepatic Function Panel: Mildly low albumin (3.6), mild hyperbilirubinemia (total 1.2), elevated alkaline phosphatase (162), and markedly low ALT (<7); consistent with cirrhosis and cholestasis.  Ammonia: Elevated (89), supporting diagnosis of hepatic encephalopathy.  Basic Metabolic Panel: Severe renal dysfunction (creatinine 14.53, eGFR 4, BUN 57), hyperkalemia (5.7), metabolic acidosis (CO2 19, anion gap 25), and hyperglycemia (glucose 196).  Phosphorus: Markedly elevated (10.5), reflecting renal failure.  Magnesium : Mildly elevated (2.4).  PT/INR: Normal (INR 1.09), no overt coagulopathy.  TSH: Normal (1.696).  Blood Gas, Venous: Mild acidemia (pH 7.39), low bicarbonate (HCO3 19), base deficit (-5.3), consistent with mild metabolic acidosis.  POCT Glucose:  Elevated (185), consistent with hyperglycemia on BMP.     My assessment required discussion with an independent historian, and is included in the HPI above:   Friend/family member at the bedside, who supplemented the history due to altered mental status.      Independent Interpretation of Tests:     I independently interpreted the EKG: Afib, rate 92, no T wave tenting. There are no new ischemic changes or arrhythmias noted.     I independently interpreted the following Radiology Imaging studies:   CXR shows bilateral opacities, but patient and family member deny cough.  CT head: No acute intracranial hemorrhage, mass effect, midline shift, territorial infarct, or skull fracture. Ventricles normal for age. Findings do not show acute intracranial injury.      Discussion with Other Providers:    I discussed the management of this patient with the:     Consultant(s): Nephrology consultant given hyperkalemia in the setting of missed dialysis.         RISK OF COMPLICATIONS / DISPOSITION     Disposition Decision  ? Admitted.  Patient remains altered and admission required due to risk of rapid deterioration. .     Patient/Family/Caregiver Discussions:   Engaged in shared decision-making with patient/family regarding need for admission. Reviewed risks of outpatient management, including potential for rapid deterioration. After discussion, patient/family agreed that inpatient monitoring is safest. This directly influenced admission plan.        Critical Care    Performed by: Rojelio Kayla ORN, MD  Authorized by: Rojelio Kayla ORN, MD    Critical care provider statement:     Critical care time (minutes):  33    Critical care time was exclusive of:  Separately billable procedures and treating other patients and teaching time    Critical care was necessary to treat or prevent imminent or life-threatening deterioration of the following conditions:  Renal failure and hepatic failure (hyperkalemia warranting nephrology consultation and reversal agent, hepatic encephlopathy, altered mental status)    Critical care was time spent personally by me on the following activities:  Development of treatment plan with patient or surrogate, blood draw for specimens, ordering and performing treatments and interventions, ordering and review of laboratory studies, ordering and review of radiographic studies, examination of patient, evaluation of patient's response to treatment, pulse oximetry, re-evaluation of patient's condition, review of old charts, obtaining history from patient or surrogate and discussions with consultants             [1]   Past Medical History:  Diagnosis Date    CHF (congestive heart failure) (CMS-HCC)     COVID-19 07/18/2019    Diabetes mellitus (CMS-HCC)     Family history of malignant neoplasm of prostate 06/23/2015    Heart disease     Heart murmur     Hepatic cirrhosis    (CMS-HCC) 06/23/2015    Overview:  Secondary to NASH; followed by Dr. Lindaann, GI.  Last Assessment & Plan:  Relevant Hx: Course: Daily Update: Today's Plan:     Hypertension     Hypogonadism in male 02/12/2014    Kidney stone     Liver cirrhosis secondary to NASH (nonalcoholic steatohepatitis) (CMS-HCC)     s/p liver transplant 2013    Obesity    [2]   Past Surgical History:  Procedure Laterality Date    CHOLECYSTECTOMY      liver transpant      LIVER TRANSPLANTATION  06/27/2011    PR ANASTOMOSIS,AV,ANY SITE Right 02/20/2024  Procedure: ARTERIOVENOUS ANASTOMOSIS, OPEN; DIRECT, ANY SITE, UPPER EXTREMITY;  Surgeon: Marchelle Kitchens, MD;  Location: [facility] OR [facility];  Service: General Surgery    PR AV ANAST,UP ARM BASILIC VEIN TRANSPOSIT Right 03/31/2024    Procedure: ARTERIOVENOUS ANASTOMOSIS, OPEN; BY UPPER ARM BASILIC VEIN TRANSPOSITION;  Surgeon: Marchelle Kitchens, MD;  Location: [facility];  Service: General Surgery    PR AV FIST REVISE GRFT,W THROMBECTOMY Right 01/15/2023    Procedure: REVISION, ARTERIOVENOUS FISTULA W/ THROMBECTOMY, AUTOGENOUS OR NONAUTOGENOUS DIALYSIS GRAFT (SEP. PROC), LOWER EXTREMITY;  Surgeon: Marchelle Kitchens, MD;  Location: [facility] OR [facility];  Service: General Surgery    PR COLONOSCOPY FLX DX W/COLLJ SPEC WHEN PFRMD N/A 01/24/2024    Procedure: COLONOSCOPY, FLEXIBLE, PROXIMAL TO SPLENIC FLEXURE; DIAGNOSTIC, W/WO COLLECTION SPECIMEN BY BRUSH OR WASH;  Surgeon: Erick Prentice HERO, MD;  Location: [facility];  Service: Gastroenterology    PR COLONOSCOPY W/BIOPSY SINGLE/MULTIPLE N/A 08/13/2018    Procedure: COLONOSCOPY, FLEXIBLE, PROXIMAL TO SPLENIC FLEXURE; WITH BIOPSY, SINGLE OR MULTIPLE;  Surgeon: Thedora Alm Plain, MD;  [facility]: [facility];  Service: Gastroenterology    PR COLSC FLX W/RMVL OF TUMOR POLYP LESION SNARE TQ N/A 08/13/2018    Procedure: COLONOSCOPY FLEX; W/REMOV TUMOR/LES BY SNARE;  Surgeon: Thedora Alm Plain, MD;  Location: [facility];  Service: Gastroenterology    PR CREAT AV FISTULA,NON-AUTOGENOUS GRAFT Right 11/29/2022    Procedure: CREATE AV FISTULA (SEPARATE PROC); NONAUTOGENOUS GRAFT (EG, BIOLOGICAL COLLAGEN, THERMOPLASTIC GRAFT), UPPER EXTREMITY;  Surgeon: Marchelle Kitchens, MD;  Location: [facility] OR [facility];  Service: General Surgery    PR UPPER GI ENDOSCOPY,BIOPSY N/A 07/13/2015    Procedure: UGI ENDOSCOPY; WITH BIOPSY, SINGLE OR MULTIPLE;  Surgeon: Elspeth Jerilynn Reek, MD;  Location: [facility];  Service: Gastroenterology    PR UPPER GI ENDOSCOPY,BIOPSY N/A 02/13/2023    Procedure: UGI ENDOSCOPY; WITH BIOPSY, SINGLE OR MULTIPLE;  Surgeon: Minnie Krystal Claude, MD;  Location: [facility];  Service: Gastroenterology    PR UPPER GI ENDOSCOPY,DIAGNOSIS N/A 01/24/2024    Procedure: UGI ENDO, INCLUDE ESOPHAGUS, STOMACH, & DUODENUM &/OR JEJUNUM; DX W/WO COLLECTION SPECIMN, BY BRUSH OR WASH;  Surgeon: Erick Prentice HERO, MD;  Location: [facility];  Service: Gastroenterology   [3]   Family History  Problem Relation Age of Onset    Diabetes Father     Diabetes Paternal Grandfather     Liver disease Maternal Uncle     Cancer Maternal Grandmother     Melanoma Neg Hx     Basal cell carcinoma Neg Hx     Squamous cell carcinoma Neg Hx     Kidney disease Neg Hx    [4]   Social History  Socioeconomic History    Marital status: Legally Separated     Spouse name: Tulia    Number of children: 2   Occupational History    Occupation: medically retired   Tobacco Use    Smoking status: Never     Passive exposure: Past    Smokeless tobacco: Never   Vaping Use    Vaping status: Never Used   Substance and Sexual Activity    Alcohol use: No    Drug use: No    Sexual activity: Yes     Partners: Female     Birth control/protection: None   Other Topics Concern    Do you use sunscreen? No    Tanning bed use? No  Are you easily burned? No    Excessive sun exposure? No    Blistering sunburns? No   Social History Narrative    From [place]    He was in the [employer].    Now lives in [place]    He works in Engineer, [employer]        Married    1 child (age 81.5)     Social Drivers of Health     Food Insecurity: No Food Insecurity (01/18/2024)    Hunger Vital Sign     Worried About Running Out of Food in the Last Year: Never true     Ran Out of Food in the Last Year: Never true   Tobacco Use: Low Risk (05/20/2024)    Patient History     Smoking Tobacco Use: Never     Smokeless Tobacco Use: Never     Passive Exposure: Past Transportation Needs: No Transportation Needs (01/18/2024)    PRAPARE - Transportation     Lack of Transportation (Medical): No     Lack of Transportation (Non-Medical): No   Alcohol Use: Not At Risk (03/31/2024)    Alcohol Use     How often do you have a drink containing alcohol?: Never     How many drinks containing alcohol do you have on a typical day when you are drinking?: 1 - 2     How often do you have 5 or more drinks on one occasion?: Never   Housing: Low Risk (01/18/2024)    Housing     Within the past 12 months, have you ever stayed: outside, in a car, in a tent, in an overnight shelter, or temporarily in someone else's home (i.e. couch-surfing)?: No     Are you worried about losing your housing?: No   Utilities: Low Risk (01/18/2024)    Utilities     Within the past 12 months, have you been unable to get utilities (heat, electricity) when it was really needed?: No   Interpersonal Safety: Not At Risk (04/29/2024)    Interpersonal Safety     Unsafe Where You Currently Live: No     Physically Hurt by Anyone: No     Abused by Anyone: No   Substance Use: Low Risk (03/31/2024)    Substance Use     In the past year, how often have you used prescription drugs for non-medical reasons?: Never     In the past year, how often have you used illegal drugs?: Never     In the past year, have you used any substance for non-medical reasons?: No   Financial Resource Strain: Low Risk (01/18/2024)    Overall Financial Resource Strain (CARDIA)     Difficulty of Paying Living Expenses: Not hard at all   Health Literacy: Low Risk (11/09/2023)    Health Literacy     : Never        Rojelio Kayla ORN, MD  05/25/24 2059

## 2024-05-25 NOTE — ED Triage Note (Signed)
 [name] cirrhosis s/p transplant (02/2012), T2DM, HFpEF, ESRD (T/Th/Sat iHD) BIB daughter with AMS since yesterday. Daughter reports LKN was 10/28. Says sx started yesterday with difficulty word finding. Pt intermittently able to answer questions in triage. Slow to respond. Taken to B side room.

## 2024-05-25 NOTE — ED Triage Note (Signed)
 Patient presents with AMS. Missed HD yesterday. Hx of ciirhossis and ESRD.

## 2024-05-26 LAB — COMPREHENSIVE METABOLIC PANEL
ALBUMIN: 3.1 g/dL — ABNORMAL LOW (ref 3.4–5.0)
ALKALINE PHOSPHATASE: 142 U/L — ABNORMAL HIGH (ref 46–116)
ALT (SGPT): <7 U/L — ABNORMAL LOW (ref 10–49)
ANION GAP: 25 mmol/L — ABNORMAL HIGH (ref 5–14)
AST (SGOT): 31 U/L (ref <=34)
BILIRUBIN TOTAL: 1 mg/dL (ref 0.3–1.2)
BLOOD UREA NITROGEN: 54 mg/dL — ABNORMAL HIGH (ref 9–23)
BUN / CREAT RATIO: 3
CALCIUM: 8.7 mg/dL (ref 8.7–10.4)
CHLORIDE: 97 mmol/L — ABNORMAL LOW (ref 98–107)
CO2: 20 mmol/L (ref 20.0–31.0)
CREATININE: 15.45 mg/dL — ABNORMAL HIGH (ref 0.73–1.18)
EGFR CKD-EPI (2021) MALE: 3 mL/min/1.73m2 — ABNORMAL LOW (ref >=60)
GLUCOSE RANDOM: 144 mg/dL (ref 70–179)
POTASSIUM: 4.8 mmol/L (ref 3.4–4.8)
PROTEIN TOTAL: 6.6 g/dL (ref 5.7–8.2)
SODIUM: 142 mmol/L (ref 135–145)

## 2024-05-26 LAB — BASIC METABOLIC PANEL
ANION GAP: 24 mmol/L — ABNORMAL HIGH (ref 5–14)
BLOOD UREA NITROGEN: 55 mg/dL — ABNORMAL HIGH (ref 9–23)
BUN / CREAT RATIO: 4
CALCIUM: 8.9 mg/dL (ref 8.7–10.4)
CHLORIDE: 96 mmol/L — ABNORMAL LOW (ref 98–107)
CO2: 20 mmol/L (ref 20.0–31.0)
CREATININE: 15.16 mg/dL — ABNORMAL HIGH (ref 0.73–1.18)
EGFR CKD-EPI (2021) MALE: 3 mL/min/1.73m2 — ABNORMAL LOW (ref >=60)
GLUCOSE RANDOM: 167 mg/dL (ref 70–179)
POTASSIUM: 4.9 mmol/L — ABNORMAL HIGH (ref 3.4–4.8)
SODIUM: 140 mmol/L (ref 135–145)

## 2024-05-26 LAB — BLOOD GAS, VENOUS
BASE EXCESS VENOUS: -2.2 — ABNORMAL LOW (ref -2.0–2.0)
CARBOXYHEMOGLOBIN, VENOUS: 1.5 % — ABNORMAL HIGH (ref <1.2)
HCO3 VENOUS: 22 mmol/L (ref 22–27)
METHEMOGLOBIN, VENOUS: <1 % (ref <1.5)
O2 SATURATION VENOUS: 88.6 % — ABNORMAL HIGH (ref 40.0–85.0)
OXYHEMOGLOBIN, VENOUS: 86.9 % — ABNORMAL HIGH (ref 40.0–85.0)
PCO2 VENOUS: 35 mmHg — ABNORMAL LOW (ref 40–60)
PH VENOUS: 7.41 (ref 7.32–7.43)
PO2 VENOUS: 60 mmHg — ABNORMAL HIGH (ref 30–55)

## 2024-05-26 LAB — CBC
HEMATOCRIT: 33.2 % — ABNORMAL LOW (ref 39.0–48.0)
HEMOGLOBIN: 10.9 g/dL — ABNORMAL LOW (ref 12.9–16.5)
MEAN CORPUSCULAR HEMOGLOBIN CONC: 32.8 g/dL (ref 32.0–36.0)
MEAN CORPUSCULAR HEMOGLOBIN: 28.1 pg (ref 25.9–32.4)
MEAN CORPUSCULAR VOLUME: 85.6 fL (ref 77.6–95.7)
MEAN PLATELET VOLUME: 8.4 fL (ref 6.8–10.7)
PLATELET COUNT: 215 10*9/L (ref 150–450)
RED BLOOD CELL COUNT: 3.88 10*12/L — ABNORMAL LOW (ref 4.26–5.60)
RED CELL DISTRIBUTION WIDTH: 14.5 % (ref 12.2–15.2)
WBC ADJUSTED: 5.2 10*9/L (ref 3.6–11.2)

## 2024-05-26 LAB — PHOSPHORUS: PHOSPHORUS: 11.5 mg/dL — ABNORMAL HIGH (ref 2.4–5.1)

## 2024-05-26 LAB — HEMOGLOBIN A1C
ESTIMATED AVERAGE GLUCOSE: 258 mg/dL
HEMOGLOBIN A1C: 10.6 % — ABNORMAL HIGH (ref 4.8–5.6)

## 2024-05-26 LAB — MAGNESIUM: MAGNESIUM: 2.5 mg/dL (ref 1.6–2.6)

## 2024-05-26 LAB — PARATHYROID HORMONE (PTH): PARATHYROID HORMONE INTACT: 943.9 pg/mL — ABNORMAL HIGH (ref 18.4–80.1)

## 2024-05-26 LAB — TACROLIMUS LEVEL, TROUGH: TACROLIMUS, TROUGH: 1.7 ng/mL — ABNORMAL LOW (ref 5.0–15.0)

## 2024-05-26 LAB — LACTATE, VENOUS, WHOLE BLOOD: LACTATE BLOOD VENOUS: 2.1 mmol/L — ABNORMAL HIGH (ref 0.5–1.8)

## 2024-05-26 MED ADMIN — mycophenolate (CELLCEPT) capsule 250 mg: 250 mg | ORAL | @ 15:00:00

## 2024-05-26 MED ADMIN — gentamicin-sodium citrate lock solution in NS: 2.1 mL

## 2024-05-26 MED ADMIN — tacrolimus (PROGRAF) capsule 0.5 mg: .5 mg | ORAL | @ 15:00:00

## 2024-05-26 MED ADMIN — ursodiol (ACTIGALL) capsule 600 mg: 600 mg | ORAL | @ 15:00:00

## 2024-05-26 MED ADMIN — lactulose (CEPHULAC) packet 20 g: 20 g | ORAL | @ 09:00:00 | Stop: 2024-05-26

## 2024-05-26 MED ADMIN — lactulose (CEPHULAC) packet 20 g: 20 g | ORAL | @ 15:00:00 | Stop: 2024-05-26

## 2024-05-26 MED ADMIN — lactulose (CEPHULAC) packet 20 g: 20 g | ORAL | @ 11:00:00 | Stop: 2024-05-26

## 2024-05-26 MED ADMIN — lactulose (CEPHULAC) packet 20 g: 20 g | ORAL | @ 02:00:00 | Stop: 2024-05-25

## 2024-05-26 MED ADMIN — lactulose (CEPHULAC) packet 20 g: 20 g | ORAL | @ 06:00:00 | Stop: 2024-05-26

## 2024-05-26 MED ADMIN — apixaban (ELIQUIS) tablet 5 mg: 5 mg | ORAL | @ 15:00:00

## 2024-05-26 MED ADMIN — lactulose (CEPHULAC) packet 20 g: 20 g | ORAL | @ 17:00:00

## 2024-05-26 MED ADMIN — cefTRIAXone (ROCEPHIN) 1 g in sodium chloride 0.9 % (NS) 100 mL IVPB-MBP: 1 g | INTRAVENOUS | @ 03:00:00 | Stop: 2024-05-25

## 2024-05-26 MED ADMIN — heparin (porcine) 1000 unit/mL injection 4,000 Units: 4000 [IU] | @ 21:00:00

## 2024-05-26 MED ADMIN — heparin (porcine) 1000 unit/mL injection 4,000 Units: 4000 [IU] | @ 20:00:00

## 2024-05-26 MED ADMIN — sodium zirconium cyclosilicate (LOKELMA) packet 10 g: 10 g | ORAL | @ 02:00:00 | Stop: 2024-05-25

## 2024-05-26 MED ADMIN — calcitriol (ROCALTROL) capsule 0.5 mcg: .5 ug | ORAL | @ 22:00:00

## 2024-05-26 NOTE — Plan of Care (Signed)
 Hemodialysis for 4 hrs., UF goal 3.5 liters as tolerated.

## 2024-05-26 NOTE — Procedures (Signed)
 [facility] Nephrology Hemodialysis Procedure Note     05/26/2024    MITHCELL SCHUMPERT was seen and examined on hemodialysis    CHIEF COMPLAINT: End Stage Renal Disease    INTERVAL HISTORY: Admitted with encephalopathy. Today doing well without major issues. Denies lightheadedness, dizziness, cramping, dyspnea.    DIALYSIS TREATMENT DATA:  Estimated Dry Weight (kg): 115.6 kg (254 lb 13.6 oz) Patient Goal Weight (kg): 3 kg (6 lb 9.8 oz)   Pre-Treatment Weight (kg): 124.5 kg (274 lb 7.6 oz)    Dialysis Bath  Bath: 2 K+ / 2.5 Ca+  Dialysate Na (mEq/L): 137 mEq/L  Dialysate HCO3 (mEq/L): 35 mEq/L Dialyzer: F-180 (98 mLs)   Blood Flow Rate (mL/min): 400 mL/min Dialysis Flow (mL/min): 800 mL/min   Machine Temperature (C): 36.5 ??C (97.7 ??F)      PHYSICAL EXAM:  Vitals:  Temp:  [36.4 ??C (97.6 ??F)-36.9 ??C (98.4 ??F)] 36.6 ??C (97.9 ??F)  Pulse:  [58-85] 68  SpO2 Pulse:  [74-88] 80  BP: (105-193)/(68-133) 166/93  MAP (mmHg):  [84-144] 118    General: in no acute distress, currently dialyzing in a Hemodialysis Recliner  Pulmonary: normal respiratory effort  Cardiovascular: extr wwp  Extremities: trace  edema  Access: Left IJ tunneled catheter     LAB DATA:  Lab Results   Component Value Date    NA 142 05/26/2024    K 4.8 05/26/2024    CL 97 (L) 05/26/2024    CO2 20.0 05/26/2024    BUN 54 (H) 05/26/2024    CREATININE 15.45 (H) 05/26/2024    GLUCOSE 196 03/03/2012    CALCIUM  8.7 05/26/2024    MG 2.5 05/26/2024    PHOS 11.5 (H) 05/26/2024    ALBUMIN 3.1 (L) 05/26/2024      Lab Results   Component Value Date    HCT 33.2 (L) 05/26/2024    WBC 5.2 05/26/2024        ASSESSMENT/PLAN:  End Stage Renal Disease on Intermittent Hemodialysis:  UF goal: 3.7 L as tolerated. Crit line prof B here.  Adjust medications for a GFR <10  Avoid nephrotoxic agents  Last HD Treatment:Completed (05/26/24)     Bone Mineral Metabolism:  Lab Results   Component Value Date    CALCIUM  8.7 05/26/2024    CALCIUM  8.9 05/26/2024    Lab Results   Component Value Date ALBUMIN 3.1 (L) 05/26/2024    ALBUMIN 3.6 05/25/2024      Lab Results   Component Value Date    PHOS 11.5 (H) 05/26/2024    PHOS 10.5 (H) 05/25/2024    Lab Results   Component Value Date    PTH 943.9 (H) 05/26/2024    PTH 267.5 (H) 10/14/2021      Phos quite high- Incr Sevelamer  to 2400 TIDAC, likely etiology for PTH increase.     Anemia:   Lab Results   Component Value Date    HGB 10.9 (L) 05/26/2024    HGB 11.6 (L) 05/25/2024    HGB 11.0 (L) 04/18/2024    Iron Saturation (%)   Date Value Ref Range Status   11/09/2022 36 % Final   05/15/2012 39 20 - 50 % Final      Lab Results   Component Value Date    FERRITIN 169.8 11/09/2022       Anemia labs appropriate, no changes.    Vascular Access:  Vascular Access functioning well - no need for intervention  Blood Flow Rate (mL/min): 400 mL/min  IV Antibiotics to be administered at discharge:  TBD    This procedure was fully reviewed with the patient and/or their decision-maker. The risks, benefits, and alternatives were discussed prior to the procedure. All questions were answered and written informed consent was obtained.    Bernardino LELON Factor, MD  [facility] Division of Nephrology & Hypertension

## 2024-05-26 NOTE — Treatment Plan (Addendum)
 Nephrology ESKD Consult Note    Requesting provider: Ezella Earnie Guillaume, MD  Service requesting consult: Emergency Medicine  Reason for consult: Evaluation for dialysis needs    Outpatient dialysis unit:   [facility]  [address]  [place] [address]     Assessment/Recommendations: Richard [name] is a 58 y.o. male with a past medical history notable for ESKD on in-center hemodialysis, cirrhosis of liver transplant with recurrent encephalopathy being evaluated at [facility] for encephalopathy, last known normal was 11/28, may have missed HD on Saturday. He was found to be on room air, CXR with cephalization. Notable labs include hyperkalemia 5.7 and bicarb of 19. CT head norma. Started on lactulose  due to concern for hepatic encephalopathy.     # ESKD:   Outpatient HD Dialysis Rx and medications    Dialysis schedule: TTS   Time: 4  hrs Temp: 36.5C   Dialyzer: Optiflux 180 NRE Heparin : Receiving heparin  4000 unit bolus and mid-treatment 4000 unit bolus   Dialysate Bath: 2 K, 2.5Ca, Na 137, Bicarbonate 35   EDW: 115.9 kg      - OP dialysis prescription reviewed and ordered  - Indication for acute dialysis?: Yes, volume overload and Yes, hyperkalemia  - We will perform HD starting tomorrow and will otherwise attempt to continue pt's outpatient schedule if possible.   - Access: Left IJ tunneled catheter ; Vascular access functioning well- no indication for intervention.  - Hepatitis status: Hepatitis B surface antigen negative on 02/06/24     # Volume / Hypertension:  - Volume: Will attempt to achieve dry weight if tolerated  - Will adjust daily as needed- CXR noted with pulmonary edema .    # Acid-Base / Electrolytes:  - Give Lokelma  10 g for hyperkalemia if able to tolerate PO   - Avoid shifting agents unless concern for EKg changes, as patient will have HD in AM and to avoid risk of hypoglycemia   - Will evaluate acid-base status and electrolyte balance daily and adjust dialysate as needed.  Lab Results   Component Value Date    K 5.7 (H) 05/25/2024    CO2 19.0 (L) 05/25/2024       # Anemia of Chronic Kidney Disease:  - ESA: Patient not on ESA  Date of last ESA dose  n/a  - IV Iron/ PO oral iron: n/a  -   Lab Results   Component Value Date    HGB 11.6 (L) 05/25/2024       # Secondary Hyperparathyroidism/Hyperphosphatemia:  - Phosphate binders : 1600 mg TID   -  Calcitriol  0.5 mcg with each treatment   - Calcimimetics: 180 mg tree times weekly   - Outpatient meds reviewed and ordered   Lab Results   Component Value Date    PHOS 10.5 (H) 05/25/2024    CALCIUM  9.1 05/25/2024       # Encephalopathy    - Evaluation and management per primary team  - No changes to management from a nephrology standpoint at this time    Kosisochukwu Burningham A Stephanny Tsutsui, DO  05/26/2024 12:52 AM

## 2024-05-26 NOTE — Consults (Signed)
 Nephrology ESKD Consult Note    Requesting provider: Nelwyn Lynwood Hutching, MD  Service requesting consult: Med General Welt (MDW)  Reason for consult: Evaluation for dialysis needs    Outpatient dialysis unit:   [facility]  [address]  [place] [address]     Assessment/Recommendations: Richard [name] is a 58 y.o. male with a past medical history notable for ESKD on in-center hemodialysis being evaluated at [facility] for encephalopathy.    # ESKD:  - Indication for acute dialysis?: No  - We will perform HD starting today and will otherwise attempt to continue pt's outpatient schedule if possible.   - Access: Left IJ tunneled catheter ; Vascular access functioning well- no indication for intervention.  - Hepatitis status: Hepatitis B surface antigen negative on 04/29/2024.    # Volume / Hypertension:  - Volume: Will attempt to achieve dry weight if tolerated  - Will adjust daily as needed    # Acid-Base / Electrolytes:  - Will evaluate acid-base status and electrolyte balance daily and adjust dialysate as needed.  Lab Results   Component Value Date    K 4.8 05/26/2024    CO2 20.0 05/26/2024       # Anemia of Chronic Kidney Disease:  - ESA: Patient not on ESA    Lab Results   Component Value Date    HGB 10.9 (L) 05/26/2024       # Secondary Hyperparathyroidism/Hyperphosphatemia:  - Phosphate binders : Sevelamer  1600 TIDAC outpatient  - Activated Vitamin D Analogs: Calcitriol  0.5 mcg TIW  Lab Results   Component Value Date    PHOS 11.5 (H) 05/26/2024    CALCIUM  8.7 05/26/2024       # Encephalopathy: Low suspicion that uremia due to missed dialysis (2 in last week) driving his encephalopathy. Last month's adequacy was low but I'm unable to see whether or not this was adequately assessed.  - Will review records    - Evaluation and management per primary team  - No changes to management from a nephrology standpoint at this time    Bernardino LELON Factor, MD  05/26/2024 7:58 PM   Medical decision-making for 05/26/24  Findings / Data     Patient has: []  acute illness w/systemic sxs  [mod]  []  two or more stable chronic illnesses [mod]  []  one chronic illness with acute exacerbation [mod]  []  acute complicated illness  [mod]  []  Undiagnosed new problem with uncertain prognosis  [mod] [x]  illness posing risk to life or bodily function (ex. AKI)  [high]  []  chronic illness with severe exacerbation/progression  [high]  []  chronic illness with severe side effects of treatment  [high] ESKD on RRT Probs At least 2:  Probs, Data, Risk   Mgm't requires: []  Prescription drug(s)  [mod]  []  Kidney biopsy  [mod]  []  Central line placement  [mod] []  High risk medication use and/or intensive toxicity monitoring [high]  [x]  Renal replacement therapy [high]  []  High risk kidney biopsy  [high]  []  Escalation of care  [high]  []  High risk central line placement  [high] RRT: High risk of complications from RRT requiring intensive monitoring Risk        _____________________________________________________________________________________    History of Present Illness: Richard [name] is a 58 y.o. male with ESKD on dialysis being evaluated at [facility] for encephalopathy who is seen in consultation at the request of Nelwyn Lynwood Hutching, MD and Med Diedre Cal (MDW). Nephrology has been consulted  for evaluation of dialysis needs in the setting of end-stage kidney disease. On my evaluation he's feeling OK and breathing comfortably. Last HD was 11/26 based on available records, with missed sessions recently in context of encephalopathy based on chart review. His objective data includes K of 5.7 and bicarb of 19 with O2 Sats 99% on RA and CXR noting bilateral opacities, edema vs. Infection. Additional history includes [name] cirrhosis with OLT 2013 with recent admissions for encepathlopathy, HF pEF, DVT, HTN, HLD, CAD.     INPATIENT MEDICATIONS:Current Medications[1]    Physical Exam:  Vitals:    05/26/24 1850   BP: 166/93   Pulse: 68   Resp: Temp:    SpO2:      No intake/output data recorded.    Intake/Output Summary (Last 24 hours) at 05/26/2024 1958  Last data filed at 05/26/2024 1845  Gross per 24 hour   Intake 320 ml   Output 3500 ml   Net -3180 ml     General: well-appearing, no acute distress  Heart: extr WWP, trace LE edema  Lungs: CTAB, normal wob  Abd: soft, non-tender, non-distended  Access: Left IJ tunneled catheter               [1]   Current Facility-Administered Medications:     acetaminophen  (TYLENOL ) tablet 650 mg, Oral, Q6H PRN    apixaban  (ELIQUIS ) tablet 5 mg, Oral, BID    calcitriol  (ROCALTROL ) capsule 0.5 mcg, Oral, Each time in dialysis    [Provider Hold] cholecalciferol  (vitamin D3 25 mcg (1,000 units)) tablet 50 mcg, Oral, Daily    dextrose  50 % in water (D50W) 50 % solution 12.5 g, Intravenous, Q15 Min PRN    gentamicin -sodium citrate  lock solution in NS, Intra-cannular, Each time in dialysis PRN    gentamicin -sodium citrate  lock solution in NS, Intra-cannular, Each time in dialysis PRN    glucagon  injection 1 mg, Intramuscular, Once PRN    glucose chewable tablet 16 g, Oral, Q10 Min PRN    heparin  (porcine) 1000 unit/mL injection 4,000 Units, Dialysis Circuit, Each time in dialysis    heparin  (porcine) 1000 unit/mL injection 4,000 Units, Dialysis Circuit, Each time in dialysis    insulin  glargine (LANTUS ) injection BASAL 70 Units, Subcutaneous, Nightly    insulin  lispro (HumaLOG ) injection CORRECTIONAL 0-20 Units, Subcutaneous, ACHS    lactulose  (CEPHULAC ) packet 20 g, Oral, Q6H SCH    melatonin tablet 3 mg, Oral, Nightly PRN    mycophenolate  (CELLCEPT ) capsule 250 mg, Oral, BID    rifAXIMin  (XIFAXAN ) tablet 550 mg, Oral, BID    senna (SENOKOT) tablet 2 tablet, Oral, Nightly PRN    [Provider Hold] sevelamer  (RENVELA ) tablet 2,400 mg, Oral, 3xd Meals    tacrolimus  (PROGRAF ) capsule 0.5 mg, Oral, BID    ursodiol  (ACTIGALL ) capsule 600 mg, Oral, BID

## 2024-05-26 NOTE — Treatment Plan (Signed)
 Internal Medicine (MEDW) Treatment Plan    Assessment & Plan:   Richard [name] is a 58 y.o. 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    Principal Problem:    Encephalopathy  Active Problems:    Hypertension    Type 2 diabetes mellitus with hyperglycemia, with long-term current use of insulin  (CMS-HCC)    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)    ESRD (end stage renal disease) on dialysis    (CMS-HCC)    (HFpEF) heart failure with preserved ejection fraction (CMS-HCC)    CAD (coronary artery disease)      Active Problems    Encephalopathy  Onset of AMS 11/29 in the setting of missed HD session. On presentation to ED patient was somnolent, oriented to self, and intermittently following commands. CT head without acute process. Neuro exam without gross focal deficits. Lab work-up notable for BUN 55 from baseline 20-30. He has had recurrent admissions for hepatic encephalopathy in the setting of a large portosystemic shunt, however it has been recently well controlled off lactulose . Leading differential for encephalopathy likely HE given medical hx/recurrence. Possible role of uremia given elevation of BUN from baseline, though prior records indicate BUN in 50s without encephalopathy and uremic encephalopathy not typically expected until BUN 100s. Furthermore, Pt has not missed any dialysis appointments except on 11/29, and AMS appears to have occurred prior to missing dialysis. Low concern for infection given he is afebrile without localizing source, however he is immunocompromised. No ascites per ED POCUS. CXR overall unremarkable. Attempted dialysis this AM, however unable to complete given Pt inability to follow commands. Pt was unable to explain happened, but is amenable to try again later today. Answering most questions appropriately but disoriented (unable to give date or year) and confused. Plan to monitor closely off antibiotic with low threshold to resume for clinical worsening.   - Nephrology consulted               - HD 12/1  - Lactulose  20mg  Q6h  - Home rifaximin  as below  - KUB     ESRD on iHD TTS - Hyperkalemia   Etiology of ESRD 2/2 CNI toxicity and HTN. He had issues tolerating HD due to catheter complications that were resolved (per chart review s/p catheter exchange and balloon angioplasty 11/25). Last HD session ~11/27, missed 11/29 HD session. K 5.7. CXR with cephalization, he is sating well on RA. No acute indication for HD. LIJ in place with surrounding ecchymosis on exam.  - Nephrology consulted  - HD 12/1 as above  - Continue sevelemer 800mg  TID   - Continue vitamin D supplement   - Daily BMP, Mg, Phos     [name] Cirrhosis s/p OLT (02/2012)  Established with [facility] Hepatology (Dr. Lennette). No history of rejection or biliary issues. however has had multiple prior admissions for hepatic encephalopathy in the setting of large portosystemic shunt demonstrated on 04/16/2024 CTAP. HE has recently been well controlled off lactulose . 01/2024 MR elastography with stage 3 fibrosis. Per chart review, VIR to consider embolization of gastrocaval shunt for refractory HE.  - Hepatology consulted, appreciate recs  - Daily tac level  - Continue tacrolimus  0.5mg  BID (goal 2-4)   - Continue cellcept  250mg  BID  - Continue ursodiol  600mg  BID   - Continue rifaximin  BID, additional  HE management as above     T2DM  A1c 10.6% on admission. Suspected home regimen: tresiba  70u daily, aspart 30u TID, mounjaro  15mg  weekly. Hyperglycemia to 200s on presentation. VBG with normal pH. Has reportedly not eaten in the last couple days.   - resume home basal insulin  regimen  - SSI  - POC ACHS  - Hypoglycemia protocol       Chronic Problems     Splenic artery aneurysm: Scheduled embolization with VIR 05/2024 given risk of rupture.   Hx DVT: Prior DVT in RLE and more recently LIJ. Resume home eliquis  5mg  BID  Hx of OSA: No longer uses CPAP.   CAD: NM spect in 2024 with moderate sized, mild in severity, fixed perfusion defect mid anterior, apical anterior and apical segments     The patient's presentation is complicated by the following clinically significant conditions requiring additional evaluation and treatment: - Disorders of electrolytes, volume status, and acid/base status: - Hyperkalemia and - Volume overload requiring further investigation, treatment, or monitoring  - Chronic kidney disease POA requiring further investigation, treatment, or monitoring   - Anemia requiring at least daily CBC for further monitoring   Issues Impacting Complexity of Management:  -The patient is at high risk of complications from encephalopathy, missed HD session        Daily Checklist:  Diet: Regular Diet phos and K restricted  DVT PPx: Patient Already on Full Anticoagulation with Eliquis  5mg  BID  Code Status: Full Code  Dispo: Admit to 7 west    Team Contact Information:   Primary Team: Internal Medicine (MEDW)  Primary Resident: Mercer Phi, MD  Resident's Pager: [phone] (Gen MedW Intern - Tower)    Interval History:   No acute events overnight. Appears alert and interactive this AM however disoriented. Attempted dialysis but could not complete as Pt was unable to follow commands. Appears amenable to trying again. Appears to understand and answer questions appropriately but with lapses in memory (unable to remember what happened at dialysis). Agrees he has been feeling more confused lately     All other systems were reviewed and are negative except as noted in the HPI    Objective:   Temp:  [36.4 ??C (97.6 ??F)-37.2 ??C (99 ??F)] 36.9 ??C (98.4 ??F)  Pulse:  [76-102] 80  SpO2 Pulse:  [74-102] 80  Resp:  [12-30] 22  BP: (105-225)/(68-133) 174/94  SpO2:  [92 %-99 %] 99 %    Gen: NAD, converses   HENT: atraumatic, normocephalic  Heart: RRR  Lungs: CTAB, no crackles or wheezes  Abdomen: soft, NTND  Extremities: mild BLE Edema  Neuro: Orientated to self, states it is 2026 when offered multiple choice, unable to state month    I attest that I have reviewed the medical student note and that the components of the history of the present illness, the physical exam, and the assessment and plan documented were performed by me or were performed in my presence by the student where I verified the documentation and performed (or re-performed) the exam and medical decision making.     Alyce Bend, MD  Internal Medicine, PGY-1

## 2024-05-26 NOTE — Consults (Signed)
 Hepatology Consult Service   Initial Consultation         Assessment and Recommendations:   Richard [name] is a 58 y.o. 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 The patient is seen in consultation at the request of Nelwyn Lynwood Hutching, MD (Med General Welt (MDW)) for hepatic encephalopathy.    HE 2/2 portosystemic shunting - S/p OLT 2013 ([name] cirrhosis)  Richard [name] has had recurrent admissions for hepatic encephalopathy though to be related to portosystemic shunting in the absence of portal hypertension. He presented with altered mental status leading him to miss dialysis. CT head without acute findings. No infectious etiologies or other precipitants identified thus far. Suspect that HE and uremia are both contributing to altered mentation and agree with initiation of lactulose  and dialysis today. The cause of his portosystemic shunting is unclear. He had no fibrosis on liver biopsy 12/2023 and normal HVPG. Question if this could be related to SFSS. There have been discussions about shunt embolization, though given technical difficulties, splenic artery embolization was considered instead. We prefer to manage with lactulose  and rifaximin  if able.     Recommendations:  -- Full infectious workup with U/A and blood cultures   -- Scheduled lactulose  20 grams TID   -- Continue rifaximin  550 mg BID   -- Continue tacrolimus  0.5 mg twice daily (goal trough 2-4) and mycophenolate  to 50 mg twice daily  -- Agree with plan for dialysis today     Issues Impacting Complexity of Management:  -None    Recommendations discussed with the patient's primary team. We will continue to follow along with you.    Subjective:   Patient presented yesterday with altered mental status.  At baseline, he is alert and oriented and works as an designer, jewellery.  Per family members, his altered mental status began 11/29 and he was only oriented to self.  For this reason, he missed hemodialysis.  He has had recurrent admissions for encephalopathy thought to be related to HE and uremia.      Currently, patient is AOX1 (person). He would respond, though is intermittently falling asleep and repeating the same phrases during the encounter.    Since admission, patient has been afebrile and hemodynamically stable.  CBC stable.  CMP notable for BUN 54, creatinine 15, phosphorus 11.5.  T. bili 1.0, AST 31, ALT 7, alk phos 142.  INR 1.09.  Ammonia 89. CT head with advanced global cerebral volume loss though no acute abnormalities.  Chest x-ray with heterogeneous bilateral opacities, possibly concerning for pulmonary edema versus infection.    He is currently receiving lactulose  20 g every 2 hours and rifaximin  550 twice daily.  He is receiving mycophenolate  250 mg twice daily and tacrolimus  0.5 mg twice daily.  No bowel movements have been documented thus far.     Objective:   Temp:  [36.4 ??C (97.6 ??F)-37.2 ??C (99 ??F)] 36.9 ??C (98.4 ??F)  Pulse:  [76-102] 80  SpO2 Pulse:  [74-102] 80  Resp:  [12-30] 18  BP: (105-225)/(68-133) 157/96  SpO2:  [92 %-99 %] 95 %    Gen: WDWN male in NAD, answers questions appropriately  Abdomen: Soft, NTND, no rebound/guarding, no hepatosplenomegaly  Extremities: No edema in the BLEs  Neuro: AOX1 (person), asterixis present     Pertinent Labs & Studies:  -I  have reviewed the patient's labs from 05/26/24 which show stable Hgb, worsening renal function (SCr), and stable LFTs

## 2024-05-26 NOTE — Progress Notes (Signed)
 Tacrolimus  Therapeutic Monitoring Pharmacy Note    Rue Tinnel is a 58 y.o. male continuing tacrolimus .     Indication: Liver transplant     Date of Transplant: 03/02/2022      Prior Dosing Information: Home regimen Tacrolimas 0.5 mg twice daily     Source(s) of information used to determine prior to admission dosing: Home Medication List or Clinic Note    Goals:  Therapeutic Drug Levels  Tacrolimus  trough goal: 2-4 ng/ml    Additional Clinical Monitoring/Outcomes  Monitor renal function (SCr and urine output) and liver function (LFTs)  Monitor for signs/symptoms of adverse events (e.g., hyperglycemia, hyperkalemia, hypomagnesemia, hypertension, headache, tremor)    Previous Lab Values  Tacrolimus , Trough   Date/Time Value Ref Range Status   04/17/2024 06:13 AM 3.2 (L) 5.0 - 15.0 ng/mL Final   04/01/2024 06:35 AM 4.5 (L) 5.0 - 15.0 ng/mL Final   02/03/2024 04:50 AM 6.2 5.0 - 15.0 ng/mL Final   02/02/2024 08:34 AM 5.2 5.0 - 15.0 ng/mL Final   02/01/2024 04:19 AM 6.4 5.0 - 15.0 ng/mL Final   10/06/2014 09:00 AM 6.8 <=20.0 ng/mL Final   08/31/2014 08:34 AM 4.2  Final   08/03/2014 08:22 AM 4.7  Final   07/07/2014 08:37 AM 4.2  Final   06/09/2014 08:27 AM 3.5  Final     Tacrolimus , Timed   Date/Time Value Ref Range Status   04/08/2024 08:28 AM 6.8 ng/mL Final     Assessment/Plan:  Recommendedation(s)  Continue current regimen of tacrolimus  0.5 mg po twice a day.,    Follow-up  Next level has been ordered on 05/26/24 at 0600.   A pharmacist will continue to monitor and recommend levels as appropriate    Please page service pharmacist with questions/clarifications.    Hallie KANDICE Mages, PharmD

## 2024-05-26 NOTE — Hospital Course (Addendum)
 Hospital Course:  Richard [name] is a 58 y.o. 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       Encephalopathy  Patient presented ~1 day after the onset of AMS that began on 11/29. He had missed his HD appointment on the day of symptom onset, though collateral from patient's sister suggests AMS preceeded (and caused) missed dialysis. Notably, he has had recurrent admissions for hepatic encephalopathy in the setting of a large portosystemic shunt prior to this admission, however it had been recently well controlled off lactulose . On presentation to ED patient was somnolent, oriented to self, and intermittently following commands. CT head without acute process. Neuro exam without gross focal deficits. Lab work-up notable for BUN 55 from baseline 20-30. No ascites per ED POCUS. CXR overall unremarkable. He was started briefly on empiric antibiotic treatment for SBP, however given the lack of ascites and infectious symptoms, antibiotics were discontinued. Lactulose  was initiated on admission and titrated as appropriate. His home regimen of rifaximin  was continued as below. Nephrology was consulted and continued to provide recommendations for care. Patient received first inpatient hemodialysis on 12/1, with plan to continue per outpatient dialysis schedule, though patient was ultimately discharged prior to subsequent session. KUB obtained 12/1 and was overall unremarkable. Significant improvement in mentation noted with continued medical management. Overall highest clinical suspicion remained for hepatic encephalopathy, though no overt precipitating factor identified given negative infectious workup and stable labs. Per nephrology, low suspicion for uremic component causing encephalopathy.       ESRD on iHD TTS - Hyperkalemia   Etiology of ESRD due to CNI toxicity and HTN. He had issues tolerating HD due to catheter complications that were resolved (per chart review, he underwent catheter exchange and balloon angioplasty 11/25). Last HD session prior to admission was ~11/27, missed 11/29 HD session. Potassium elevated to 5.7 on admission. LIJ in place with surrounding ecchymosis on exam. Per nephrology recommendations, he received dialysis inpatient as above. His home regimen of sevelemer and vitamin D supplement were continued. Electrolyte levels were monitored closely with daily labs. Continued regular outpatient dialysis schedule outpatient.        [name] Cirrhosis s/p OLT (02/2012)  Patient is established with [facility] Hepatology (Dr. Lennette). No history of rejection or biliary issues, however has had multiple prior admissions for hepatic encephalopathy in the setting of large portosystemic shunt demonstrated on 04/16/2024 CTAP. HE well controlled off lactulose  prior to this admission. 01/2024 MR elastography with stage 3 fibrosis. Per chart review, VIR to consider embolization of gastrocaval shunt for refractory HE. Home regimen tacrolimus , cellcept , ursodiol , and rifaximin  continued. Hepatology was consulted and recommended continued medical management with lactulose  and rifaximin  as above.      T2DM  Hyperglycemia to 200s with A1c 10.6% on admission. VBG with normal pH. Reportedly had not eaten in the last couple days prior to admission. Suspected home regimen: tresiba  70u daily, aspart 30u TID, mounjaro  15mg  weekly. Home basal insulin  regimen and sliding scale insulin  resumed inpatient, with careful monitoring of blood glucose levels.    Splenic artery aneurysm: Scheduled embolization with VIR 05/2024 given risk of rupture.   Hx DVT: Prior DVT in RLE and more recently LIJ. Home eliquis  resumed on hospital day 2  Hx of OSA: No longer uses CPAP.   CAD: NM spect in  2024 with moderate sized, mild in severity, fixed perfusion defect mid anterior, apical anterior and apical segments

## 2024-05-26 NOTE — H&P (Signed)
 Internal Medicine (MEDW) History & Physical    Assessment & Plan:   [name] is a 58 y.o. 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    Principal Problem:    Encephalopathy  Active Problems:    Hypertension    Type 2 diabetes mellitus with hyperglycemia, with long-term current use of insulin  (CMS-HCC)    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)    ESRD (end stage renal disease) on dialysis    (CMS-HCC)    (HFpEF) heart failure with preserved ejection fraction (CMS-HCC)    CAD (coronary artery disease)    Active Problems    Encephalopathy  Onset of AMS 11/29 in the setting of missed HD session. Patient is somnolent, oriented to self, and intermittently following commands. CT head without acute process. Neuro exam without gross focal deficits. Lab work-up notable for BUN 55 from baseline 20-30. He has had recurrent admissions for hepatic encephalopathy in the setting of a large portosystemic shunt, however it has been recently well controlled off lactulose . Differential likely multifactorial due to both HE and uremia. Will address accordingly. Low concern for infection given he is afebrile without localizing source, however he is immunocompromised. No ascites per ED POCUS. CXR likely cephalization given clinical picture. Plan to monitor closely off antibiotic with low threshold to resume for clinical worsening.   - Nephrology consulted    - HD 12/1  - Lactulose    - Home rifaximin  as below  - KUB    ESRD on iHD TTS - Hyperkalemia   Etiology of ESRD 2/2 CNI toxicity and HTN. He had issues tolerating HD due to catheter complications that were resolved (per chart review s/p catheter exchange and balloon angioplasty 11/25). Last HD session ~11/27, missed 11/29 HD session. K 5.7. CXR with cephalization, he is sating well on RA. No acute indication for HD. LIJ in place with surrounding ecchymosis on exam.  - Nephrology consulted  - HD 12/1 as above  - Continue sevelemer 800mg  TID   - Continue vitamin D supplement   - Daily BMP, Mg, Phos    [name] Cirrhosis s/p OLT (02/2012)  Established with [facility] Hepatology (Dr. Lennette). No history of rejection or biliary issues. however has had multiple prior admissions for hepatic encephalopathy in the setting of large portosystemic shunt demonstrated on 04/16/2024 CTAP. HE has recently been well controlled off lactulose . 01/2024 MR elastography with stage 3 fibrosis. Per chart review, VIR to consider embolization of gastrocaval shunt for refractory HE.  - Hepatology consult in AM   - Daily tac level  - Continue tacrolimus  0.5mg  BID (goal 2-4)   - Continue cellcept  250mg  BID  - Continue ursodiol  600mg  BID   - Continue rifaximin  BID, additional HE management as above    T2DM  Suspected home regimen: tresiba  70u daily, aspart 30u TID, mounjaro  15mg  weekly. Hyperglycemia to 200s on presentation. VBG with normal pH. Has reportedly not eaten in the last couple days.   - Hold home insulin , resume as able  - SSI  - POC ACHS  - Hypoglycemia protocol  - A1c    Chronic Problems    Splenic artery aneurysm: Scheduled embolization with VIR 05/2024 given risk of rupture.   Hx DVT: Prior DVT in RLE and more recently LIJ. Hold eliquis  5mg  BID pending  clinical course.  Hx of OSA: No longer uses CPAP.   CAD: NM spect in 2024 with moderate sized, mild in severity, fixed perfusion defect mid anterior, apical anterior and apical segments    The patient's presentation is complicated by the following clinically significant conditions requiring additional evaluation and treatment: - Disorders of electrolytes, volume status, and acid/base status: - Hyperkalemia and - Volume overload requiring further investigation, treatment, or monitoring  - Chronic kidney disease POA requiring further investigation, treatment, or monitoring   - Anemia requiring at least daily CBC for further monitoring   Issues Impacting Complexity of Management:  -The patient is at high risk of complications from encephalopathy, missed HD session    Medical Decision Making: Reviewed records from the following unique sources  ED notes.    Checklist:  Diet: NPO  DVT PPx: Patient Already on Full Anticoagulation with Eliquis   Code Status: Full Code  Dispo: Patient appropriate for Inpatient based on expectation of ongoing need for hospitalization greater than two midnights based on severity of presentation/services including encephalopathy    Team Contact Information:   Primary Team: Internal Medicine (MEDW)  Primary Resident: Schuyler JAYSON Breeding, MD  Resident's Pager: [phone] [facility]ident)    Chief Concern:   Encephalopathy    Subjective:   Richard [name] is a 58 y.o. 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    History obtained by family and chart review.     HPI:    Spoke with sister and HCDM over phone. Patient is typically alert and oriented at baseline and works as an designer, jewellery. Onset of altered mental status 11/29 after which he was only oriented to self. Per sister, the encephalopathy most likely caused him to miss HD as he would not have otherwise missed a session. He had prior issues tolerating HD due to catheter complications that were resolved (per chart review s/p catheter exchange and balloon angioplasty 05/20/2024). He has had recurrent admissions for encephalopathy thought to be multifactorial due to hepatic encephalopathy and uremia, however encephalopathy has recently been well controlled off lactulose . Of note, 04/16/2024 CTAP demonstrated a large portosystemic shunt.      On my interview, patient is somnolent but arouses to voice. Oriented to self. Frequently falling asleep during encounter. Intermittently following commands. Denies pain.     Per chart review, he lives with girlfriend Powell in [place] [facility]) in a 1 level home. Will need to be confirmed in AM.    In the ED:  Vitals: Temp 37.2, HR 102, BP 182/129, RR 21, SpO2 of 98% on RA  Labs:   CBC-Hgb 11.6  CMP-K 5.7, Cl 95, bicarb 19, AG 25, P 10.5, AP 162  INR 1.09  Ammonia 89  TSH WNL  VBG 7.39/32  Cultures: N/A  EKG: AF, RBBB  Imaging: Chest x-ray with bilateral opacities concerning for pulmonary edema versus infection.  CT head with age-advanced global cerebral volume loss, no AIP.   Interventions:   Ceftriaxone  1 g 2155  Lactulose  20 g 2107  Lokelma  10 g 2.7    Pertinent Surgical Hx  Past Surgical History[1]     Pertinent Family Hx  Family History[2]     Pertinent Social Hx   Social History[3]     Allergies  Patient has no known allergies.    I reviewed the Medication  List. There is Uncertainty about the medications the patient is currently taking.    Prior to Admission medications   Medication Dose, Route, Frequency   apixaban  (ELIQUIS ) 5 mg Tab 5 mg, Oral, 2 times a day (standard)  Patient not taking: Reported on 04/29/2024   blood sugar diagnostic (GLUCOSE BLOOD) Strp Use to check blood sugar as directed with insulin  3 times a day and for symptoms of high or low blood sugar.   blood-glucose meter kit Use as directed to check blood sugars   blood-glucose meter kit Use as instructed   blood-glucose meter,continuous (DEXCOM G6 RECEIVER) Misc 1 each, Miscellaneous, Daily, Dispense 1 receiver annually.  Sent to [facility]   blood-glucose sensor (DEXCOM G7 SENSOR) Devi Use to continuously monitor blood glucose.  Change sensor every 10 days.   CHOLECALCIFEROL , VITAMIN D3, (VITAMIN D3 ORAL) 2,000 Units, Daily (standard)   famotidine  (PEPCID ) 10 MG tablet 10 mg, Daily PRN   gabapentin  (NEURONTIN ) 300 MG capsule 300 mg, Oral, Nightly   hydrOXYzine  (ATARAX ) 25 MG tablet 25 mg, Oral, Every 6 hours PRN  Patient not taking: Reported on 05/20/2024   insulin  aspart (NOVOLOG  FLEXPEN U-100 INSULIN ) 100 unit/mL (3 mL) injection pen 30 Units, Subcutaneous, 3 times a day (AC)   insulin  degludec (TRESIBA  FLEXTOUCH U-200) 200 unit/mL (3 mL) InPn 70 Units, Subcutaneous, Daily (standard), Max dose of 100 units per day.     If fasting glucose is greater than 180, increase basal insulin  by 5 units. Recheck fasting glucose the following morning and continue to increase by 5 units daily until fasting sugar less than 180.   lancets Misc Use to check blood sugar as directed with insulin  3 times a day and for symptoms of high or low blood sugar.   multivitamin (TAB-A-VITE/THERAGRAN) per tablet 1 tablet, Daily (standard)   mycophenolate  (CELLCEPT ) 250 mg capsule 250 mg, Oral, 2 times a day (standard)   pantoprazole  (PROTONIX ) 40 MG tablet 40 mg, Daily PRN   pen needle, diabetic (TRUEPLUS PEN NEEDLE) 32 gauge x 5/32 (4 mm) Ndle Use with insulin  up to 4 times a day as needed.   pen needle, diabetic 32 gauge x 5/32 (4 mm) Ndle Use with insulin  up to 4 times daily as needed.   sevelamer  (RENAGEL ) 800 MG tablet 800 mg, 3 times a day (with meals)   tacrolimus  (PROGRAF ) 0.5 MG capsule 0.5 mg, Oral, 2 times a day   tirzepatide  (MOUNJARO ) 15 mg/0.5 mL PnIj 15 mg, Subcutaneous, Every 7 days     Librarian, Academic:  Mr. Milich currently has decisional capacity for healthcare decision-making and is able to designate a surrogate healthcare decision maker. Mr. Gunderson designated healthcare decision maker(s) is/are [name] (the patient's adult sibling) as denoted by previously stated patient preference.    Objective:   Physical Exam:  Temp:  [37.2 ??C (99 ??F)] 37.2 ??C (99 ??F)  Pulse:  [76-102] 84  SpO2 Pulse:  [75-102] 87  Resp:  [18-30] 30  BP: (146-225)/(92-133) 152/95  SpO2:  [94 %-98 %] 94 %    Gen: NAD, converses   Eyes: Sclera anicteric, EOMI grossly normal   HENT: Atraumatic, normocephalic  Neck: Trachea midline  Heart: Normal rate  Lungs: CTAB, no crackles or wheezes  Abdomen: Soft, NTND  Extremities: Trace pitting edema in lower extremities  Neuro: Grossly symmetric, non-focal    Skin:  Tunneled LIJ in place with surrounding ecchymosis, no surrounding erythema or purulence; No rashes, lesions on clothed exam  Psych: Alert,  oriented         [1]   Past Surgical History:  Procedure Laterality Date    CHOLECYSTECTOMY      liver transpant      LIVER TRANSPLANTATION  06/27/2011    PR ANASTOMOSIS,AV,ANY SITE Right 02/20/2024    Procedure: ARTERIOVENOUS ANASTOMOSIS, OPEN; DIRECT, ANY SITE, UPPER EXTREMITY;  Surgeon: Marchelle Kitchens, MD;  [facility];  Service: General Surgery    PR AV ANAST,UP ARM BASILIC VEIN TRANSPOSIT Right 03/31/2024    Procedure: ARTERIOVENOUS ANASTOMOSIS, OPEN; BY UPPER ARM BASILIC VEIN TRANSPOSITION;  Surgeon: Marchelle Kitchens, MD;  Location: [facility] OR [facility];  Service: General Surgery    PR AV FIST REVISE GRFT,W THROMBECTOMY Right 01/15/2023    Procedure: REVISION, ARTERIOVENOUS FISTULA W/ THROMBECTOMY, AUTOGENOUS OR NONAUTOGENOUS DIALYSIS GRAFT (SEP. PROC), LOWER EXTREMITY;  Surgeon: Marchelle Kitchens, MD;  [facility];  Service: General Surgery    PR COLONOSCOPY FLX DX W/COLLJ SPEC WHEN PFRMD N/A 01/24/2024    Procedure: COLONOSCOPY, FLEXIBLE, PROXIMAL TO SPLENIC FLEXURE; DIAGNOSTIC, W/WO COLLECTION SPECIMEN BY BRUSH OR WASH;  Surgeon: Erick Prentice HERO, MD;  [facility];  Service: Gastroenterology    PR COLONOSCOPY W/BIOPSY SINGLE/MULTIPLE N/A 08/13/2018    Procedure: COLONOSCOPY, FLEXIBLE, PROXIMAL TO SPLENIC FLEXURE; WITH BIOPSY, SINGLE OR MULTIPLE;  Surgeon: Thedora Alm Plain, MD;  [facility]: [facility];  Service: Gastroenterology    PR COLSC FLX W/RMVL OF TUMOR POLYP LESION SNARE TQ N/A 08/13/2018    Procedure: COLONOSCOPY FLEX; W/REMOV TUMOR/LES BY SNARE;  Surgeon: Thedora Alm Plain, MD;  [facility]: [facility];  Service: Gastroenterology    PR CREAT AV FISTULA,NON-AUTOGENOUS GRAFT Right 11/29/2022    Procedure: CREATE AV FISTULA (SEPARATE PROC); NONAUTOGENOUS GRAFT (EG, BIOLOGICAL COLLAGEN, THERMOPLASTIC GRAFT), UPPER EXTREMITY;  Surgeon: Marchelle Kitchens, MD;  Location: [facility] OR [facility];  Service: General Surgery    PR UPPER GI ENDOSCOPY,BIOPSY N/A 07/13/2015    Procedure: UGI ENDOSCOPY; WITH BIOPSY, SINGLE OR MULTIPLE;  Surgeon: Elspeth Jerilynn Reek, MD;  Location: [facility];  Service: Gastroenterology    PR UPPER GI ENDOSCOPY,BIOPSY N/A 02/13/2023    Procedure: UGI ENDOSCOPY; WITH BIOPSY, SINGLE OR MULTIPLE;  Surgeon: Minnie Krystal Claude, MD;  Location: [facility];  Service: Gastroenterology    PR UPPER GI ENDOSCOPY,DIAGNOSIS N/A 01/24/2024    Procedure: UGI ENDO, INCLUDE ESOPHAGUS, STOMACH, & DUODENUM &/OR JEJUNUM; DX W/WO COLLECTION SPECIMN, BY BRUSH OR WASH;  Surgeon: Erick Prentice HERO, MD;  Location: [facility];  Service: Gastroenterology   [2]   Family History  Problem Relation Age of Onset    Diabetes Father     Diabetes Paternal Grandfather     Liver disease Maternal Uncle     Cancer Maternal Grandmother     Melanoma Neg Hx     Basal cell carcinoma Neg Hx     Squamous cell carcinoma Neg Hx     Kidney disease Neg Hx    [3]   Social History  Socioeconomic History    Marital status: Legally Separated     Spouse name: Tulia    Number of children: 2   Occupational History    Occupation: medically retired   Tobacco Use    Smoking status: Never     Passive exposure: Past    Smokeless tobacco: Never   Vaping Use    Vaping status: Never Used   Substance and Sexual Activity    Alcohol  use: No    Drug use: No    Sexual activity: Yes     Partners: Female     Birth control/protection: None   Other Topics Concern    Do you use sunscreen? No    Tanning bed use? No    Are you easily burned? No    Excessive sun exposure? No    Blistering sunburns? No Social History Narrative    From [place]    He was in the [employer].    Now lives in [place]    He works in Engineer, Petroleum        Married    1 child (age 29.5)     Social Drivers of Health     Food Insecurity: No Food Insecurity (01/18/2024)    Hunger Vital Sign     Worried About Running Out of Food in the Last Year: Never true     Ran Out of Food in the Last Year: Never true   Tobacco Use: Low Risk (05/20/2024)    Patient History     Smoking Tobacco Use: Never     Smokeless Tobacco Use: Never     Passive Exposure: Past   Transportation Needs: No Transportation Needs (01/18/2024)    PRAPARE - Transportation     Lack of Transportation (Medical): No     Lack of Transportation (Non-Medical): No   Alcohol Use: Not At Risk (03/31/2024)    Alcohol Use     How often do you have a drink containing alcohol?: Never     How many drinks containing alcohol do you have on a typical day when you are drinking?: 1 - 2     How often do you have 5 or more drinks on one occasion?: Never   Housing: Low Risk (01/18/2024)    Housing     Within the past 12 months, have you ever stayed: outside, in a car, in a tent, in an overnight shelter, or temporarily in someone else's home (i.e. couch-surfing)?: No     Are you worried about losing your housing?: No   Utilities: Low Risk (01/18/2024)    Utilities     Within the past 12 months, have you been unable to get utilities (heat, electricity) when it was really needed?: No   Interpersonal Safety: Not At Risk (04/29/2024)    Interpersonal Safety     Unsafe Where You Currently Live: No     Physically Hurt by Anyone: No     Abused by Anyone: No   Substance Use: Low Risk (03/31/2024)    Substance Use     In the past year, how often have you used prescription drugs for non-medical reasons?: Never     In the past year, how often have you used illegal drugs?: Never     In the past year, have you used any substance for non-medical reasons?: No   Financial Resource Strain: Low Risk (01/18/2024)    Overall Financial Resource Strain (CARDIA)     Difficulty of Paying Living Expenses: Not hard at all   Health Literacy: Low Risk (11/09/2023)    Health Literacy     : Never

## 2024-05-26 NOTE — Procedures (Signed)
 HEMODIALYSIS NURSE PROCEDURE NOTE       Treatment Number:  1 Room / Station:  6    Procedure Date:  05/26/24 Device Name/Number: Edsel    Total Dialysis Treatment Time:  244 Min.    CONSENT:    Written consent was obtained prior to the procedure and is detailed in the medical record.  Prior to the start of the procedure, a time out was taken and the identity of the patient was confirmed via name, medical record number and date of birth.     WEIGHT:   Date/Time Pre-Treatment Weight (kg) Estimated Dry Weight (kg) Patient Goal Weight (kg) Total Goal Weight (kg)    05/26/24 1435 124.5 kg (274 lb 7.6 oz)  115.6 kg (254 lb 13.6 oz)  3 kg (6 lb 9.8 oz)  3.55 kg (7 lb 13.2 oz)     05/26/24 0745 124.5 kg (274 lb 7.6 oz)  115.6 kg (254 lb 13.6 oz)  2 kg (4 lb 6.6 oz)  2.55 kg (5 lb 10 oz)        Date/Time Post-Treatment Weight (kg) Treatment Weight Change (kg)    05/26/24 1845 --  [facility], pt drowsy. on stretcher  --      Active Dialysis Orders (168h ago, onward)       Start     Ordered    05/26/24 0110  Hemodialysis inpatient  Every Tue,Thu,Sat      Question Answer Comment   Patient HD Status: Chronic    New Start? No    K+ 2 meq/L    Ca++ 2.5 meq/L    Bicarb 35 meq/L    Na+ 137 meq/L    Na+ Modeling no    Dialyzer [id number]    Dialysate Temperature (C) 36.5    BFR-As tolerated to a maximum of: 500 mL/min    DFR 800 mL/min    Duration of treatment 4 Hr    Dry weight (kg) 115.6    Challenge dry weight (kg) no    Fluid removal (L) to EDW as tolerated by bp and crit line    Tubing Adult = 142 ml    Access Site Dialysis Catheter    Access Site Location Left        05/26/24 0109    05/26/24 0108  Dialysis Schedule Order  (Dialysis ONCE)  Once         05/26/24 0109    05/26/24 0108  Hemodialysis inpatient  (Dialysis ONCE)  Once        Question Answer Comment   Patient HD Status: Chronic    New Start? No    K+ 2 meq/L    Ca++ 2.5 meq/L    Bicarb 35 meq/L    Na+ 137 meq/L    Na+ Modeling no    Dialyzer [id number]    BFR-As tolerated to a maximum of: 500 mL/min    DFR 800 mL/min    Duration of treatment 4 Hr    Dry weight (kg) 115.6    Challenge dry weight (kg) no    Fluid removal (L) to EDW as tolerated by bp and crit line    Tubing Adult = 142 ml    Access Site Dialysis Catheter    Access Site Location Left        05/26/24 0109                  ASSESSMENT:  General appearance: drowsy  Neurologic: follow commands  Lungs: clear, diminished  Heart: S1S2  Abdomen: rounded  Pulses:   Skin: warm and dry    ACCESS SITE:       Hemodialysis Catheter 05/20/24 Left Internal jugular 2.1 mL 2.1 mL (Active)   Site Assessment Clean;Dry;Intact 05/26/24 1850   Proximal Lumen Status / Patency Gentamicin  Citrate Locked 05/26/24 1850   Proximal Lumen Intervention Deaccessed 05/26/24 1850   Medial Lumen Status / Patency Gentamicin  Citrate Locked 05/26/24 1850   Medial Lumen Intervention Deaccessed 05/26/24 1850   Dressing Intervention New dressing 05/26/24 1850   Dressing Status      Clean;Dry;Intact/not removed 05/26/24 1850   Verification by X-ray Yes 05/20/24 1644   Site Condition Other (Comment) 05/26/24 1850   Dressing Type CHG gel;Occlusive;Transparent 05/26/24 1850   Dressing Change Due 06/02/24 05/26/24 1850   Line Necessity Reviewed? Y 05/26/24 1850   Line Necessity Indications Yes - Hemodialysis 05/26/24 1850           Catheter fill volumes:    Arterial: 2.1 mL Venous: 2.1 mL   Catheter filled with gentamicin  mg Gentamicin  Citrate post procedure.     Patient Lines/Drains/Airways Status       Active Peripheral & Central Intravenous Access       Name Placement date Placement time Site Days    Peripheral IV 05/25/24 Anterior;Left;Proximal Forearm 05/25/24  1831  Forearm  1    Hemodialysis Catheter 05/20/24 Left Internal jugular 2.1 mL 2.1 mL 05/20/24  1611  Internal jugular  6                   LAB RESULTS:  Lab Results   Component Value Date    NA 142 05/26/2024    K 4.8 05/26/2024    CL 97 (L) 05/26/2024    CO2 20.0 05/26/2024    BUN 54 (H) 05/26/2024 CREATININE 15.45 (H) 05/26/2024    GLU 144 05/26/2024    GLUF 138 10/06/2014    CALCIUM  8.7 05/26/2024    CAION 3.67 (L) 02/02/2024    ICAV 4.88 03/04/2012    PHOS 11.5 (H) 05/26/2024    MG 2.5 05/26/2024    PTH 943.9 (H) 05/26/2024    IRON 95 11/09/2022    LABIRON 36 11/09/2022    TRANSFERRIN 212.0 (L) 11/09/2022    FERRITIN 169.8 11/09/2022    TIBC 267.1 11/09/2022     Lab Results   Component Value Date    WBC 5.2 05/26/2024    HGB 10.9 (L) 05/26/2024    HCT 33.2 (L) 05/26/2024    PLT 215 05/26/2024    PHART 7.36 03/04/2012    PO2ART 88 03/04/2012    PCO2ART 43 03/04/2012    HCO3ART 23.4 03/04/2012    BEART -1.3 03/04/2012    O2SATART 98.1 03/04/2012    PTINR : 05/06/2012    APTT 290.0 (HH) 01/19/2024        VITAL SIGNS:    Date/Time Temp Temp src       05/26/24 1845 36.6 ??C (97.9 ??F)  Oral      05/26/24 1437 36.8 ??C (98.2 ??F)  Oral        Date/Time Pulse BP MAP (mmHg) Patient Position    05/26/24 1850 68  166/93  --  Sitting     05/26/24 1845 73  153/96  --  Sitting     05/26/24 1830 81  122/82  --  Sitting     05/26/24 1800 70  153/87  --  Sitting     05/26/24 1730  66  141/82  --  Sitting     05/26/24 1715 66  162/101  --  Sitting     05/26/24 1700 58  137/83  --  Sitting     05/26/24 1645 69  169/112  --  Sitting     05/26/24 1630 75  179/108  --  Sitting     05/26/24 1615 66  153/97  --  Sitting     05/26/24 1600 75  155/87  --  Sitting     05/26/24 1545 75  157/105  --  Sitting     05/26/24 1530 65  178/111  --  Sitting     05/26/24 1515 78  184/111  --  Sitting     05/26/24 1441 73  187/117  --  Sitting     05/26/24 1437 77  185/108  --  Sitting       Date/Time Blood Volume Change (%) HCT HGB Critline O2 SAT %    05/26/24 1830 -14.1 %  36.6  12.5  69     05/26/24 1800 -12.4 %  35.9  12.2  68.7     05/26/24 1730 -11.5 %  35.6  12.1  73.3     05/26/24 1700 -9 %  34.6  11.8  75.4     05/26/24 1630 -7.7 %  34.1  11.6  74.3     05/26/24 1615 -6.6 %  33.7  11.5  74.3     05/26/24 1600 -6 %  33.5  11.4  75.7 05/26/24 1545 -5 %  33.1  11.3  76.4     05/26/24 1530 -3.6 %  32.7  11.1  75.2     05/26/24 1515 -3.1 %  32.5  11.1  74.7       Date/Time Resp SpO2 O2 Device O2 Flow Rate (L/min)    05/26/24 1850 --  --  None (Room air)  --    05/26/24 1845 20  --  None (Room air)  --    05/26/24 1830 20  --  None (Room air)  --    05/26/24 1800 20  --  None (Room air)  --    05/26/24 1730 20  --  None (Room air)  --    05/26/24 1715 --  --  None (Room air)  --    05/26/24 1700 20  --  None (Room air)  --    05/26/24 1630 20  --  None (Room air)  --    05/26/24 1615 --  --  None (Room air)  --    05/26/24 1600 18  --  None (Room air)  --    05/26/24 1545 --  --  None (Room air)  --    05/26/24 1530 18  --  None (Room air)  --    05/26/24 1515 18  --  None (Room air)  --    05/26/24 1441 18  --  None (Room air)  --    05/26/24 1437 18  --  None (Room air)  --        Date/Time Therapy Number Dialyzer Hemodialysis Line Type All Machine Alarms Passed    05/26/24 1435 --  F-180 (98 mLs)  Adult (142 m/s)  Yes     05/26/24 0745 1  F-180 (98 mLs)  Adult (142 m/s)  Yes       Date/Time Air Detector Saline Line Double Clampled Hemo-Safe Applied Dialysis Flow (mL/min)    05/26/24  1435 Engaged  --  --  800 mL/min     05/26/24 0745 Engaged  --  --  800 mL/min       Date/Time Verify Priming Solution Priming Volume Hemodialysis Independent pH Hemodialysis Machine Conductivity (mS/cm)    05/26/24 1435 0.9% NS  300 mL  --  136 mS/cm     05/26/24 0745 0.9% NS  300 mL  --  13.7 mS/cm       Date/Time Hemodialysis Independent Conductivity (mS/cm) Bicarb Conductivity Residual Bleach Negative Total Chlorine    05/26/24 1435 135 mS/cm  -- Yes  0     05/26/24 0745 13.7 mS/cm  -- Yes  0       Date/Time Pre-Hemodialysis Comments    05/26/24 1435 Alert, delayed response       Date/Time Blood Flow Rate (mL/min) Arterial Pressure (mmHg) Venous Pressure (mmHg) Transmembrane Pressure (mmHg)    05/26/24 1845 --  --  --  --     05/26/24 1830 400 mL/min  -226 mmHg 169 mmHg  30 mmHg     05/26/24 1800 400 mL/min  -223 mmHg  166 mmHg  34 mmHg     05/26/24 1730 400 mL/min  -219 mmHg  163 mmHg  30 mmHg     05/26/24 1700 400 mL/min  -216 mmHg  156 mmHg  37 mmHg     05/26/24 1630 400 mL/min  -209 mmHg  160 mmHg  33 mmHg     05/26/24 1615 400 mL/min  -206 mmHg  157 mmHg  31 mmHg     05/26/24 1600 400 mL/min  -203 mmHg  154 mmHg  30 mmHg     05/26/24 1545 400 mL/min  -201 mmHg  152 mmHg  30 mmHg     05/26/24 1530 400 mL/min  -200 mmHg  150 mmHg  30 mmHg     05/26/24 1515 400 mL/min  -190 mmHg  180 mmHg  10 mmHg     05/26/24 1441 400 mL/min  -180 mmHg  150 mmHg  20 mmHg       Date/Time Ultrafiltration Rate (mL/hr) Ultrafiltrate Removed (mL) Dialysate Flow Rate (mL/min) KECN (Kecn)    05/26/24 1845 --  4050 mL  --  --     05/26/24 1830 1260 mL/hr  3688 mL  800 ml/min  --     05/26/24 1800 1260 mL/hr  3064 mL  800 ml/min  --     05/26/24 1730 1270 mL/hr  2445 mL  800 ml/min  --     05/26/24 1700 1270 mL/hr  1816 mL  800 ml/min  --     05/26/24 1630 1270 mL/hr  1190 mL  800 ml/min  --     05/26/24 1615 1060 mL/hr  887 mL  800 ml/min  --     05/26/24 1600 1060 mL/hr  628 mL  800 ml/min  --     05/26/24 1545 900 mL/hr  378 mL  800 ml/min  --     05/26/24 1530 900 mL/hr  190 mL  800 ml/min  --     05/26/24 1515 430 mL/hr  90 mL  800 ml/min  --     05/26/24 1441 140 mL/hr  0 mL  800 ml/min  --       Date/Time Intra-Hemodialysis Comments    05/26/24 1850 post HD     05/26/24 1845 Tx completed, blood returned     05/26/24 1830 resting     05/26/24 1800 vs stable  05/26/24 1730 vs stable     05/26/24 1700 resting, vs stable     05/26/24 1630 resting, VS stable     05/26/24 1620 Dr. [name] on rounds     05/26/24 1615 resting, UF goal increased to 3.5 liters as tolerated     05/26/24 1600 resting, vs stable     05/26/24 1545 resting, UF goal increased to 3 liters as tolerated.     05/26/24 1530 vs stable, no complains. UF goal increased to 2.5 liters as tolerated     05/26/24 1515 resting, UF goal increased to 1 liter as tolerated     05/26/24 1441 Pt confused, Tx started       Date/Time Rinseback Volume (mL) On Line Clearance: spKt/V Total Liters Processed (L/min) Dialyzer Clearance    05/26/24 1845 300 mL  1.01 spKt/V  91.3 L/min  Moderately streaked       Date/Time Post-Hemodialysis Comments    05/26/24 1845 VSS post HD       Date/Time Total Hemodialysis Replacement Volume (mL) Total Ultrafiltrate Output (mL)    05/26/24 1845 --  3500 mL        79-D-79-D - Medicaitons Given During Treatment  (last 5 hrs)           AHEARN, OLIVIA C, MD         Medication Name Action Time Action Route Rate Dose User     [Provider Hold] sevelamer  (RENVELA ) tablet 800 mg On hold since today at 0212 until manually unheld; held by Darleen Schuyler BROCKS, MDHold Reason: Other (See comments): 05/26/24 1700 Provider Hold Dose Oral   Ahearn, Olivia C, MD            FOUST, LINWOOD, RN BSN         Medication Name Action Time Action Route Rate Dose User     insulin  lispro (HumaLOG ) injection CORRECTIONAL 0-20 Units 05/26/24 1744 Not Given Subcutaneous   Foust, Linwood, RN BSN            Galileah Piggee G, RN         Medication Name Action Time Action Route Rate Dose User     calcitriol  (ROCALTROL ) capsule 0.5 mcg 05/26/24 1721 Given Oral  0.5 mcg Tayvia Faughnan G, RN     gentamicin -sodium citrate  lock solution in NS 05/26/24 1830 Given Intra-cannular  2.1 mL Jadier Rockers G, RN     gentamicin -sodium citrate  lock solution in NS 05/26/24 1830 Given Intra-cannular  2.1 mL Lethia Erle MATSU, RN     heparin  (porcine) 1000 unit/mL injection 4,000 Units 05/26/24 1501 Given Dialysis Circuit  4,000 Units Zeffie Bickert G, RN     heparin  (porcine) 1000 unit/mL injection 4,000 Units 05/26/24 1622 Given Dialysis Circuit  4,000 Units Jehieli Brassell G, RN                      Patient tolerated treatment in a  Doctor, General Practice.

## 2024-05-27 LAB — CBC
HEMATOCRIT: 32.6 % — ABNORMAL LOW (ref 39.0–48.0)
HEMOGLOBIN: 10.6 g/dL — ABNORMAL LOW (ref 12.9–16.5)
MEAN CORPUSCULAR HEMOGLOBIN CONC: 32.6 g/dL (ref 32.0–36.0)
MEAN CORPUSCULAR HEMOGLOBIN: 27.6 pg (ref 25.9–32.4)
MEAN CORPUSCULAR VOLUME: 84.7 fL (ref 77.6–95.7)
MEAN PLATELET VOLUME: 8.3 fL (ref 6.8–10.7)
PLATELET COUNT: 215 10*9/L (ref 150–450)
RED BLOOD CELL COUNT: 3.84 10*12/L — ABNORMAL LOW (ref 4.26–5.60)
RED CELL DISTRIBUTION WIDTH: 14.4 % (ref 12.2–15.2)
WBC ADJUSTED: 5.5 10*9/L (ref 3.6–11.2)

## 2024-05-27 LAB — COMPREHENSIVE METABOLIC PANEL
ALBUMIN: 3.3 g/dL — ABNORMAL LOW (ref 3.4–5.0)
ALKALINE PHOSPHATASE: 145 U/L — ABNORMAL HIGH (ref 46–116)
ALT (SGPT): <7 U/L — ABNORMAL LOW (ref 10–49)
ANION GAP: 18 mmol/L — ABNORMAL HIGH (ref 5–14)
AST (SGOT): 22 U/L (ref <=34)
BILIRUBIN TOTAL: 0.8 mg/dL (ref 0.3–1.2)
BLOOD UREA NITROGEN: 29 mg/dL — ABNORMAL HIGH (ref 9–23)
BUN / CREAT RATIO: 3
CALCIUM: 9 mg/dL (ref 8.7–10.4)
CHLORIDE: 95 mmol/L — ABNORMAL LOW (ref 98–107)
CO2: 26 mmol/L (ref 20.0–31.0)
CREATININE: 8.44 mg/dL — ABNORMAL HIGH (ref 0.73–1.18)
EGFR CKD-EPI (2021) MALE: 7 mL/min/1.73m2 — ABNORMAL LOW (ref >=60)
GLUCOSE RANDOM: 93 mg/dL (ref 70–179)
POTASSIUM: 3.5 mmol/L (ref 3.4–4.8)
PROTEIN TOTAL: 6.7 g/dL (ref 5.7–8.2)
SODIUM: 139 mmol/L (ref 135–145)

## 2024-05-27 LAB — HEPATITIS B SURFACE ANTIBODY
HEPATITIS B SURFACE ANTIBODY QUANT: <8 m[IU]/mL (ref <8.00)
HEPATITIS B SURFACE ANTIBODY: NONREACTIVE

## 2024-05-27 LAB — HEPATITIS B SURFACE ANTIGEN: HEPATITIS B SURFACE ANTIGEN: NONREACTIVE

## 2024-05-27 LAB — PHOSPHORUS: PHOSPHORUS: 7.1 mg/dL — ABNORMAL HIGH (ref 2.4–5.1)

## 2024-05-27 LAB — MAGNESIUM: MAGNESIUM: 2.1 mg/dL (ref 1.6–2.6)

## 2024-05-27 MED ADMIN — mycophenolate (CELLCEPT) capsule 250 mg: 250 mg | ORAL | @ 14:00:00

## 2024-05-27 MED ADMIN — mycophenolate (CELLCEPT) capsule 250 mg: 250 mg | ORAL | @ 03:00:00

## 2024-05-27 MED ADMIN — tacrolimus (PROGRAF) capsule 0.5 mg: .5 mg | ORAL | @ 03:00:00

## 2024-05-27 MED ADMIN — tacrolimus (PROGRAF) capsule 0.5 mg: .5 mg | ORAL | @ 14:00:00

## 2024-05-27 MED ADMIN — ursodiol (ACTIGALL) capsule 600 mg: 600 mg | ORAL | @ 14:00:00

## 2024-05-27 MED ADMIN — ursodiol (ACTIGALL) capsule 600 mg: 600 mg | ORAL | @ 03:00:00

## 2024-05-27 MED ADMIN — apixaban (ELIQUIS) tablet 5 mg: 5 mg | ORAL | @ 03:00:00

## 2024-05-27 MED ADMIN — apixaban (ELIQUIS) tablet 5 mg: 5 mg | ORAL | @ 14:00:00

## 2024-05-27 MED ADMIN — rifAXIMin (XIFAXAN) tablet 550 mg: 550 mg | ORAL | @ 03:00:00 | Stop: 2024-06-24

## 2024-05-27 MED ADMIN — rifAXIMin (XIFAXAN) tablet 550 mg: 550 mg | ORAL | @ 14:00:00 | Stop: 2024-06-24

## 2024-05-27 MED ADMIN — lactulose (CEPHULAC) packet 20 g: 20 g | ORAL | @ 18:00:00

## 2024-05-27 MED ADMIN — lactulose (CEPHULAC) packet 20 g: 20 g | ORAL | @ 04:00:00

## 2024-05-27 MED ADMIN — lactulose (CEPHULAC) packet 20 g: 20 g | ORAL | @ 10:00:00

## 2024-05-27 MED ADMIN — insulin glargine (LANTUS) injection BASAL 70 Units: 70 [IU] | SUBCUTANEOUS | @ 04:00:00

## 2024-05-27 NOTE — Plan of Care (Signed)
 The patient was seen being wheeled by transport via wheelchair being accompanied by a [employer]. He is confused. Patient was able to tolerate taking his medications whole. Vital signs taken and recorded.  Blood sugar monitored. Not in any distress during this time. Needs attended. Plan of care ongoing.     Shift Summary  rifAXIMin  was administered during the shift for encephalopathy management.  Frequent repositioning and pressure reduction interventions were performed to support skin integrity and comfort.  Fall prevention strategies, including bed alarm and sitter, were maintained with no reported falls.  Moderate assistance was provided for most ADLs, with feeding remaining independent.  Overall, the shift was stable with consistent safety and comfort interventions in place.    Optimal Comfort and Wellbeing: Positioning was changed frequently and pressure reduction techniques were consistently used, with a pressure-redistributing mattress and incontinence pads or limited adhesive for skin protection throughout the shift; Braden score remained stable at 19 and skin was within defined limits.    Absence of Fall and Fall-Related Injury: Bed alarm remained activated, sitter was present at intervals, and fall reduction interventions were maintained with hourly visual checks and environmental modifications; no falls occurred during the shift.    Optimal Pain Control and Function: Pain was consistently reported as 0 throughout the shift, and no pain interventions were required.    Improved Ability to Complete Activities of Daily Living: Moderate assistance was required for hygiene, dressing, grooming, and bathing, while feeding remained independent; no significant changes in assistance needs were observed during the shift.

## 2024-05-27 NOTE — Progress Notes (Signed)
 Internal Medicine (MEDW) Progress Note    Assessment & Plan:   Richard [name] is a 58 y.o. 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    Principal Problem:    Encephalopathy  Active Problems:    Hypertension    Type 2 diabetes mellitus with hyperglycemia, with long-term current use of insulin  (CMS-HCC)    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)    ESRD (end stage renal disease) on dialysis    (CMS-HCC)    (HFpEF) heart failure with preserved ejection fraction (CMS-HCC)    CAD (coronary artery disease)      Active Problems    Encephalopathy  Onset of AMS 11/29 in the setting of missed HD session. On presentation to ED patient was somnolent, oriented to self, and intermittently following commands. CT head without acute process. Neuro exam without gross focal deficits. No ascites on ED POCUS and CXR benign. Lab work-up notable for BUN 55 from baseline 20-30. He has had recurrent admissions for hepatic encephalopathy in the setting of a large portosystemic shunt, however it had been recently well controlled off lactulose . Leading differential for encephalopathy likely HE given medical hx/recurrence. Low suspicion of uremic encephalopathy per nephrology. Prior records also indicate BUN in 50s without encephalopathy and Pt has not missed any dialysis appointments except on 11/29 which was preceeded by AMS. Overall clinically improving, with notable improvement noted in mentation today. A&O x4 this AM, Pt asking appropriate questions regarding his care and demonstrates good insight into confusion from day prior. Potential role of delirium in confusion given waxing/waning status (per overnight report was markedly confused) and that Pt seems to have attention deficit. Low concern for infection given he is afebrile without localizing source and improvement without abx, however given immunocompromised state and unclear precipitating factor for HE, cannot rule out. Had 3 BM yesterday. KUB without obstruction or heavy stool burden.   - Nephrology consulted, dialysis according to outpatient schedule  - Lactulose  20mg  q6h  - Home rifaximin  as below     ESRD on iHD TTS - Hyperkalemia   Etiology of ESRD 2/2 CNI toxicity and HTN. He had issues tolerating HD due to catheter complications that were resolved (per chart review s/p catheter exchange and balloon angioplasty 11/25). Last outpatient HD session ~11/27, missed 11/29 HD session. K 5.7 on admission. CXR with cephalization, he is sating well on RA. No acute indication for HD. LIJ in place with surrounding ecchymosis on exam. Tolerated HD on 12/1 well with appropriate drop in K, BUN, Cr.   - Nephrology consulted, dialysis according to outpatient schedule  - Continue sevelamer  800mg  TID   - Continue vitamin D supplement   - Daily BMP, Mg, Phos     [name] Cirrhosis s/p OLT (02/2012)  Established with [facility] (Dr. Lennette). No history of rejection or biliary issues. however has had multiple prior admissions for hepatic encephalopathy in the setting of large portosystemic shunt demonstrated on 04/16/2024 CTAP. HE had recently been well controlled off lactulose . 01/2024 MR elastography with stage 3 fibrosis. Per chart review, VIR to consider embolization of gastrocaval shunt for refractory HE. Hepatology consulted, recommend continued medical management with lactulose  + rifaximin  and home immunosuppressants, consider splenic artery embolization if unfavorable response.   - Hepatology consulted, appreciate recs  - Daily tac level  - Continue  tacrolimus  0.5mg  BID (goal 2-4)   - Continue cellcept  250mg  BID  - Continue ursodiol  600mg  BID   - Continue rifaximin  BID, additional HE management as above     T2DM  A1c 10.6% on admission. Suspected home regimen: tresiba  70u daily, aspart 30u TID, mounjaro  15mg  weekly. Hyperglycemia to 200s on presentation. VBG with normal pH. Has reportedly not eaten in the last couple days prior to admission.   - resume home basal insulin  regimen  - SSI  - POC ACHS  - Hypoglycemia protocol       Chronic Problems     Splenic artery aneurysm: Scheduled embolization with VIR 05/2024 given risk of rupture.   Hx DVT: Prior DVT in RLE and more recently LIJ. Resume home eliquis  5mg  BID  Hx of OSA: No longer uses CPAP.   CAD: NM spect in 2024 with moderate sized, mild in severity, fixed perfusion defect mid anterior, apical anterior and apical segments     The patient's presentation is complicated by the following clinically significant conditions requiring additional evaluation and treatment: - Disorders of electrolytes, volume status, and acid/base status: - Hyperkalemia and - Volume overload requiring further investigation, treatment, or monitoring  - Chronic kidney disease POA requiring further investigation, treatment, or monitoring   - Anemia requiring at least daily CBC for further monitoring   Issues Impacting Complexity of Management:  -The patient is at high risk of complications from encephalopathy, missed HD session        Daily Checklist:  Diet: Regular Diet phos and K restricted  DVT PPx: Patient Already on Full Anticoagulation with Eliquis  5mg  BID  Code Status: Full Code  Dispo: Admit to [facility]    Team Contact Information:   Primary Team: Internal Medicine (MEDW)  Primary Resident: Mercer Phi, MD  Resident's Pager: [phone] (Gen MedW Intern - Tower)    Interval History:   No acute events overnight.     A&O x4 this AM, interactive and pleasant. Expresses desire to remove fall risk/sitter. Asking appropriate questions about post discharge care (how long to take lactulose , etc). Has good insight into confused state from day prior. Denies CP, abd pain, SOB, n/v.     All other systems were reviewed and are negative except as noted in the HPI    Objective:   Temp:  [36.4 ??C (97.5 ??F)-37.6 ??C (99.7 ??F)] 36.4 ??C (97.5 ??F)  Pulse:  [58-81] 61  Resp:  [18-20] 18  BP: (122-187)/(79-117) 136/87  SpO2:  [97 %-99 %] 98 %    Gen: NAD, converses   HENT: atraumatic, normocephalic  Heart: RRR  Lungs: CTAB, no crackles or wheezes  Abdomen: soft, NTND  Extremities: mild BLE Edema  Neuro: A&O x4    Resident attestation: I saw and evaluated the patient, participating in the key portions of the service. I discussed the findings, assessment and plan with the resident and agree with that which is documented in the resident's note.     VEAR Roslynn Diamond, MD  May 27, 2024 4:34 PM

## 2024-05-27 NOTE — Progress Notes (Signed)
 Hepatology Consult Service   Progress Note         Assessment and Recommendations:   Richard [name] is a 58 y.o. 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 The patient is seen in consultation at the request of Nelwyn Lynwood Hutching, MD ([facility] (MD[facility])) for hepatic encephalopathy.    HE 2/2 portosystemic shunting - S/p OLT 2013 ([name] cirrhosis)  Richard [name] has had recurrent admissions for hepatic encephalopathy though to be related to portosystemic shunting in the absence of portal hypertension. He presented with altered mental status leading him to miss dialysis. CT head without acute findings. No infectious etiologies or other precipitants identified. Suspect that HE and uremia were both contributing to altered mentation as he is significantly improved with dialysis and lactulose .  We recommend discharging on lactulose , at least until follow-up with his hepatologist.    Recommendations:  -- Follow up infectious workup  -- Discharge on lactulose  20 grams TID at least until follow up with his Hepatologist   -- Continue rifaximin  550 mg BID   -- Continue tacrolimus  0.5 mg twice daily (goal trough 2-4) and mycophenolate  to 50 mg twice daily  -- I have communicated with Dr. Cecille to coordinate follow up     Issues Impacting Complexity of Management:  -None    Recommendations discussed with the patient's primary team. We will sign-off at this time, please re-contact if additional questions or a new need for consultation arises.    Subjective:   Patient went to dialysis yesterday afternoon. NOE. Patient has remained afebrile and HD stable. He's had 3 documented BM in the past 24 hours after 6 doses of lactulose . Labs stable. Blood cultures pending.  Patient's mental status has significantly improved.  No asterixis on today's exam.    Objective: Temp:  [36.4 ??C (97.5 ??F)-37.6 ??C (99.7 ??F)] 36.4 ??C (97.5 ??F)  Pulse:  [58-81] 61  SpO2 Pulse:  [80] 80  Resp:  [18-22] 18  BP: (122-187)/(79-117) 136/87  SpO2:  [96 %-99 %] 98 %    Gen: WDWN male in NAD, answers questions appropriately  Abdomen: Soft, NTND, no rebound/guarding, no hepatosplenomegaly  Extremities: No edema in the BLEs  Neuro: Alert, oriented, speaking in full sentences, no asterixis    Pertinent Labs & Studies:  -I have reviewed the patient's labs from 05/27/24 which show stable Hgb, stable renal function (SCr, electrolytes), and stable LFTs

## 2024-05-27 NOTE — Consults (Signed)
 Care Management  Initial Transition Planning Assessment    Patient lives with girlfriend ETTER Moats) in [place] in a single level home with one guardrail 3 steps to enter. At baseline, patient has been independent with ADLS. Patient denies use of home health services. DME - glucometer. Patient is currently on dialysis ( T,TH,SAT-[id number]) - established with outpatient chair with [facility] at [facility]  location ). Patient's girlfriend - Moats will provide transportation and will assist pt with basic care at home. Patient has sister Olam - who lives out of state. Pt is legally married, but currently separated. HCPOA needs to be updated. New form provided.              General  Care Manager / Social Worker assessed the patient by : In person interview with patient, In person interview with family (spoke with sister Olam by phone also followed up with pt at bedside to confirm)  Orientation Level: Disoriented to situation  Functional level prior to admission: Independent  Reason for referral: Discharge Planning, Home Health    Contact/Decision Maker  Extended Emergency Contact Information  Primary Emergency Contact: [facility]  Address: [address]           [place], [place] [address] [place]  Work Phone: [phone]  Mobile Phone: [phone]  Relation: Sister  Type of Residence: Mailing Address:  [address]  [place] [address]  Contacts: Accompanied by: Family member  Patient Phone Number:   Telephone Information:   Mobile [phone]            Medical Provider(s): Jama Mayo, MD  Reason for Admission: Admitting Diagnosis:  Hepatic encephalopathy    (CMS-HCC) [K76.82]  Hyperkalemia [E87.5]  Altered mental status, unspecified altered mental status type [R41.82]  Past Medical History:   has a past medical history of CHF (congestive heart failure) (CMS-HCC), COVID-19 (07/18/2019), Diabetes mellitus (CMS-HCC), Family history of malignant neoplasm of prostate (06/23/2015), Heart disease, Heart murmur, Hepatic cirrhosis    (CMS-HCC) (06/23/2015), Hypertension, Hypogonadism in male (02/12/2014), Kidney stone, Liver cirrhosis secondary to NASH (nonalcoholic steatohepatitis) (CMS-HCC), and Obesity.  Past Surgical History:   has a past surgical history that includes liver transpant; Liver transplantation (06/27/2011); pr upper gi endoscopy,biopsy (N/A, 07/13/2015); pr colsc flx w/rmvl of tumor polyp lesion snare tq (N/A, 08/13/2018); pr colonoscopy w/biopsy single/multiple (N/A, 08/13/2018); pr creat av fistula,non-autogenous graft (Right, 11/29/2022); pr av fist revise grft,w thrombectomy (Right, 01/15/2023); pr upper gi endoscopy,biopsy (N/A, 02/13/2023); Cholecystectomy; pr colonoscopy flx dx w/collj spec when pfrmd (N/A, 01/24/2024); pr upper gi endoscopy,diagnosis (N/A, 01/24/2024); pr anastomosis,av,any site (Right, 02/20/2024); and pr av anast,up arm basilic vein transposit (Right, 03/31/2024).   Previous admit date: 04/01/2024    Primary Insurance- Payor: BCBS / Plan: ELECTR[employer] ENGINE[employer] WITH [employer] / Product Type: *No Product type* /   Secondary Insurance - Secondary Insurance  [place] M[employer] CAID AMER[employer]*  Prescription Coverage - BCBS- primary/ [place] medicaid ( secondary)  Preferred Pharmacy - [facility]    Transportation home: Hydrograph[employer] Surveyor Next of Kin / Guardian / POA / Advance Directives     HCDM (patient stated preference): Zonia Olam - Sister - [phone]    Advance Directive (Medical Treatment)  Does patient have an advance directive covering medical treatment?: Patient has advance directive covering medical treatment, copy in chart. (Sister/ pt requesting new hcpoa ( as pt is still legally married ,but would  request to remove soon to be ex wife ) -new hcpoa form provided to pt)  Reason patient does not have an advance directive covering medical treatment:: Patient needs follow-up to complete one.    Health Care Decision Maker [HCDM] (Medical & Mental Health Treatment)  Healthcare Decision Maker: HCDM documented in the HCDM/Contact Info section.  Information offered on HCDM, Medical & Mental Health advance directives:: Patient given information.    Advance Directive (Mental Health Treatment)  Does patient have an advance directive covering mental health treatment?: Patient does not have advance directive covering mental health treatment.  Reason patient does not have an advance directive covering mental health treatment:: HCDM documented in the HCDM/Contact Info section.    Readmission Information    Have you been hospitalized in the last 30 days?: Yes (04/2524-05/24/24)  Name of Hospital: [facility]           Number of Days between previous discharge and readmission date: 4-7 days    Type of Readmission: Related to Previous Admission      Did the following happen with your discharge? N/a        Patient Information  Lives with: Other (Comment) (legally married ( separated ) - lives alone has girlfriend- Research Scientist (physical Sciences))    Type of Residence: Private residence        Location/Detail: [address] [place] [address]    Support Systems/Concerns: Family Members    Responsibilities/Dependents at home?: No    Home Care services in place prior to admission?: No       Equipment Currently Used at Home: glucometer       Currently receiving outpatient dialysis?: Yes  Facility providing dialysis (Name/Contact Info): [facility] [address] [place] [address] ( phone [phone])    Financial Information       Social Drivers of Health  Social Drivers of Health were addressed in provider documentation.  Please refer to patient history.    Complex Discharge Information    Is patient identified as a difficult/complex discharge?: No       Interventions: Initial assessment is complete        Discharge Needs Assessment  Concerns to be Addressed: discharge planning    Clinical Risk Factors: Readmission < 30 Days, Functional Limitations, Multiple Diagnoses (Chronic), Dialysis    Barriers to taking medications: No    Prior overnight hospital stay or ED visit in last 90 days: Yes         Patient's Choice of Community Agency(s): Patient is agreeable to home health services if recommended, no preference of agency but would like to look at a list of agencies if [facility] is recommended.  No preference stated.    Anticipated Changes Related to Illness:  (Likely will need time to recover to resume prior level of functioning.)    Equipment Needed After Discharge:  (CM will follow for DME needs.)    Discharge Facility/Level of Care Needs:  (CM will follow for d/ cneeds)    Readmission  Risk of Unplanned Readmission Score: UNPLANNED READMISSION SCORE: 33.84%  Predictive Model Details          34% (High)  Factor Value    Calculated 05/27/2024 08:05 19% Number of ED visits in last six months 5    [place] Risk of Unplanned Readmission Model 18% Number of active inpatient medication orders 37     10% Number of hospitalizations in last year 3     8% Charlson Comorbidity Index 10  7% ECG/EKG order present in last 6 months     6% Latest BUN high (29 mg/dL)     5% Diagnosis of electrolyte disorder present     5% Imaging order present in last 6 months     4% Latest hemoglobin low (10.6 g/dL)     4% Phosphorous result present     3% Age 58     3% Active anticoagulant inpatient medication order present     3% Latest creatinine high (8.44 mg/dL)     3% Diagnosis of renal failure present     1% Future appointment scheduled     1% Current length of stay 1.285 days     1% Active ulcer inpatient medication order present      Readmitted Within the Last 30 Days? (No if blank)   Patient at risk for readmission?: Yes    Discharge Plan  Screen findings are: Discharge planning needs identified or anticipated (Comment). (CM to follow)    Expected Discharge Date: 05/28/2024    Expected Transfer from Critical Care:      Quality data for continuing care services shared with patient and/or representative?: No  Patient and/or family were provided with choice of facilities / services that are available and appropriate to meet post hospital care needs?: No       Initial Assessment complete?: Yes    Darice Pyo, RN, MSN  Case Manager  [phone]

## 2024-05-27 NOTE — Progress Notes (Signed)
 Nephrology ESKD Follow-up Consult Note    Assessment/Recommendations: Richard [name] is a 58 y.o. male with a past medical history notable for ESKD on in-center hemodialysis admitted with encephalopathy.     # ESKD:  - No indications for urgent/emergent HD today based upon volume status, electrolytes or acidemia. We will continue hemodialysis thrice weekly. Please see orders for most up to date prescription.  - Volume: Will attempt to achieve dry weight if tolerated  - Medication list currently appropriate  - Vascular access functioning well- no indication for intervention.  Current dialysis Rx:   EDW:115.6 kg (254 lb 13.6 oz)     Dialyzer:F-180 (98 mLs)   Bath:2 K+ / 2.5 Ca+  ;  Na:137 mEq/L  ;  CO2:35 mEq/L   Recent labs:  Na/K/Cl/CO2:139/3.5/95*/26.0 (12/02 0346)    # Anemia of Chronic Kidney Disease:   - Will continue outpatient management with ESA and adjust as indicated. No current need for transfusion.  Lab Results   Component Value Date    HGB 10.6 (L) 05/27/2024    HGB 10.9 (L) 05/26/2024    HGB 11.6 (L) 05/25/2024     Lab Results   Component Value Date    LABIRON 36 11/09/2022    TIBC 267.1 11/09/2022    FERRITIN 169.8 11/09/2022     Discussed recommendations with primary team.  Thanks for this consult, we will continue to follow along and provide recommendations as needed.    *Please contact Nephrology PRIOR to hospital discharge so we can assist the primary team and ensure appropriate dialysis related follow-up (ie scheduling, antibiotics, changes in EDW and dialysis access, etc.).DEWAINE Bernardino LELON [name], MD  05/27/2024 1:47 PM   Medical decision-making for 05/27/24  Findings / Data     Patient has: []  acute illness w/systemic sxs  [mod]  []  two or more stable chronic illnesses [mod]  []  one chronic illness with acute exacerbation [mod]  []  acute complicated illness  [mod]  []  Undiagnosed new problem with uncertain prognosis  [mod] [x]  illness posing risk to life or bodily function (ex. AKI)  [high]  []  chronic illness with severe exacerbation/progression  [high]  []  chronic illness with severe side effects of treatment  [high] ESKD on RRT Probs At least 2:  Probs, Data, Risk   Mgm't requires: []  Prescription drug(s)  [mod]  []  Kidney biopsy  [mod]  []  Central line placement  [mod] []  High risk medication use and/or intensive toxicity monitoring [high]  [x]  Renal replacement therapy [high]  []  High risk kidney biopsy  [high]  []  Escalation of care  [high]  []  High risk central line placement  [high] RRT: High risk of complications from RRT requiring intensive monitoring Risk      _____________________________________________________________________________________    Subjective/Interval Events:  Doing well, no issues, feeling comfortable and not confused.    Physical Exam:  Vitals:    05/26/24 2044 05/26/24 2227 05/27/24 0033 05/27/24 0731   BP: 174/87 174/87 140/79 136/87   Pulse: 60 60 72 61   Resp: 18 18 20 18    Temp: 36.7 ??C (98.1 ??F) 36.7 ??C (98.1 ??F) 37.6 ??C (99.7 ??F) 36.4 ??C (97.5 ??F)   TempSrc: Oral  Oral Oral   SpO2: 99% 99% 97% 98%   Weight:  (!) 120 kg (264 lb 8.8 oz)     Height:  177.8 cm (5' 10)       No intake/output data recorded.    Intake/Output Summary (Last 24 hours)  at 05/27/2024 1347  Last data filed at 05/26/2024 1845  Gross per 24 hour   Intake --   Output 3500 ml   Net -3500 ml     General: well-appearing, no acute distress  CV: extr warm  Lungs: normal work of breathing  Ext: trace-1+ edema bilat LE

## 2024-05-27 NOTE — Plan of Care (Signed)
 Shift Summary  Patient was repositioned frequently and provided with comfort and skin protection measures throughout the shift, with no reported pain and stable psychosocial status.  Fall prevention interventions were maintained, though the patient refused the bed alarm later in the shift; no falls or injuries occurred.  Temperature and sepsis risk scores remained stable, and no new infection-related concerns were documented.  Discharge planning was initiated, with the patient agreeable to home health services if recommended and advance directive documentation updated.  Patient remained stable throughout the shift with consistent assistance needs and no acute changes in condition.    Optimal Comfort and Wellbeing: Comfort measures such as frequent repositioning, use of incontinence pads, and pressure-redistributing mattress were maintained throughout the shift; patient required minimal to standby assistance for hygiene and mobility, and was able to express needs and thoughts. No pain was reported and psychosocial needs were addressed, though some uncooperative behavior was noted early in the shift.    Absence of Fall and Fall-Related Injury: Fall prevention strategies including bed alarm, frequent toileting, and environmental modifications were consistently implemented, though the patient refused the bed alarm later in the shift; hourly visual checks and standby assistance were maintained.    Absence of Infection Signs and Symptoms: Temperature remained stable and within normal range, and sepsis risk scores were unchanged throughout the shift.    Optimal Pain Control and Function: No pain was reported at any time during the shift.    Improved Ability to Complete Activities of Daily Living: Patient required minimal to standby assistance for hygiene, bathing, and oral care throughout the shift, with no change in level of assistance needed.

## 2024-05-28 LAB — CBC
HEMATOCRIT: 32.3 % — ABNORMAL LOW (ref 39.0–48.0)
HEMOGLOBIN: 10.7 g/dL — ABNORMAL LOW (ref 12.9–16.5)
MEAN CORPUSCULAR HEMOGLOBIN CONC: 33.1 g/dL (ref 32.0–36.0)
MEAN CORPUSCULAR HEMOGLOBIN: 28.1 pg (ref 25.9–32.4)
MEAN CORPUSCULAR VOLUME: 84.7 fL (ref 77.6–95.7)
MEAN PLATELET VOLUME: 8.3 fL (ref 6.8–10.7)
PLATELET COUNT: 209 10*9/L (ref 150–450)
RED BLOOD CELL COUNT: 3.82 10*12/L — ABNORMAL LOW (ref 4.26–5.60)
RED CELL DISTRIBUTION WIDTH: 14.5 % (ref 12.2–15.2)
WBC ADJUSTED: 4.6 10*9/L (ref 3.6–11.2)

## 2024-05-28 LAB — COMPREHENSIVE METABOLIC PANEL
ALBUMIN: 3.3 g/dL — ABNORMAL LOW (ref 3.4–5.0)
ALKALINE PHOSPHATASE: 142 U/L — ABNORMAL HIGH (ref 46–116)
ALT (SGPT): <7 U/L — ABNORMAL LOW (ref 10–49)
ANION GAP: 19 mmol/L — ABNORMAL HIGH (ref 5–14)
AST (SGOT): 22 U/L (ref <=34)
BILIRUBIN TOTAL: 0.6 mg/dL (ref 0.3–1.2)
BLOOD UREA NITROGEN: 36 mg/dL — ABNORMAL HIGH (ref 9–23)
BUN / CREAT RATIO: 3
CALCIUM: 9.4 mg/dL (ref 8.7–10.4)
CHLORIDE: 96 mmol/L — ABNORMAL LOW (ref 98–107)
CO2: 24 mmol/L (ref 20.0–31.0)
CREATININE: 10.48 mg/dL — ABNORMAL HIGH (ref 0.73–1.18)
EGFR CKD-EPI (2021) MALE: 5 mL/min/1.73m2 — ABNORMAL LOW (ref >=60)
GLUCOSE RANDOM: 152 mg/dL (ref 70–179)
POTASSIUM: 3.4 mmol/L (ref 3.4–4.8)
PROTEIN TOTAL: 6.8 g/dL (ref 5.7–8.2)
SODIUM: 139 mmol/L (ref 135–145)

## 2024-05-28 LAB — PHOSPHORUS: PHOSPHORUS: 8.6 mg/dL — ABNORMAL HIGH (ref 2.4–5.1)

## 2024-05-28 LAB — MAGNESIUM: MAGNESIUM: 2.4 mg/dL (ref 1.6–2.6)

## 2024-05-28 MED ORDER — LACTULOSE 20 GRAM ORAL PACKET
PACK | Freq: Three times a day (TID) | ORAL | 2 refills | 30.00000 days | Status: CP
Start: 2024-05-28 — End: 2024-08-26
  Filled 2024-05-28: qty 30, 10d supply, fill #0

## 2024-05-28 MED ORDER — CALCITRIOL 0.5 MCG CAPSULE
ORAL_CAPSULE | Freq: Every day | ORAL | 11 refills | 30.00000 days | Status: CN
Start: 2024-05-28 — End: 2025-05-28

## 2024-05-28 MED ORDER — SEVELAMER CARBONATE 800 MG TABLET
ORAL_TABLET | Freq: Three times a day (TID) | ORAL | 2 refills | 30.00000 days | Status: CP
Start: 2024-05-28 — End: 2024-08-26
  Filled 2024-05-28: qty 270, 30d supply, fill #0

## 2024-05-28 MED ADMIN — mycophenolate (CELLCEPT) capsule 250 mg: 250 mg | ORAL | @ 02:00:00

## 2024-05-28 MED ADMIN — mycophenolate (CELLCEPT) capsule 250 mg: 250 mg | ORAL | @ 14:00:00 | Stop: 2024-05-28

## 2024-05-28 MED ADMIN — tacrolimus (PROGRAF) capsule 0.5 mg: .5 mg | ORAL | @ 01:00:00

## 2024-05-28 MED ADMIN — tacrolimus (PROGRAF) capsule 0.5 mg: .5 mg | ORAL | @ 14:00:00 | Stop: 2024-05-28

## 2024-05-28 MED ADMIN — ursodiol (ACTIGALL) capsule 600 mg: 600 mg | ORAL | @ 14:00:00 | Stop: 2024-05-28

## 2024-05-28 MED ADMIN — ursodiol (ACTIGALL) capsule 600 mg: 600 mg | ORAL | @ 01:00:00

## 2024-05-28 MED ADMIN — sevelamer (RENVELA) tablet 2,400 mg: 2400 mg | ORAL | @ 18:00:00 | Stop: 2024-05-28

## 2024-05-28 MED ADMIN — apixaban (ELIQUIS) tablet 5 mg: 5 mg | ORAL | @ 14:00:00 | Stop: 2024-05-28

## 2024-05-28 MED ADMIN — apixaban (ELIQUIS) tablet 5 mg: 5 mg | ORAL | @ 01:00:00

## 2024-05-28 MED ADMIN — rifAXIMin (XIFAXAN) tablet 550 mg: 550 mg | ORAL | @ 01:00:00 | Stop: 2024-06-24

## 2024-05-28 MED ADMIN — rifAXIMin (XIFAXAN) tablet 550 mg: 550 mg | ORAL | @ 14:00:00 | Stop: 2024-05-28

## 2024-05-28 MED ADMIN — cholecalciferol (vitamin D3 25 mcg (1,000 units)) tablet 50 mcg: 50 ug | ORAL | @ 18:00:00 | Stop: 2024-05-28

## 2024-05-28 MED ADMIN — lactulose (CEPHULAC) packet 20 g: 20 g | ORAL | @ 04:00:00

## 2024-05-28 MED ADMIN — lactulose (CEPHULAC) packet 20 g: 20 g | ORAL | @ 01:00:00

## 2024-05-28 MED ADMIN — insulin glargine (LANTUS) injection BASAL 70 Units: 70 [IU] | SUBCUTANEOUS | @ 02:00:00

## 2024-05-28 MED ADMIN — insulin lispro (HumaLOG) injection CORRECTIONAL 0-20 Units: 0-20 [IU] | SUBCUTANEOUS | @ 01:00:00

## 2024-05-28 NOTE — Plan of Care (Signed)
 Shift Summary  Insulin  lispro was administered in response to elevated blood glucose, with no complications noted.  Peripheral IV site remained clean and intact, requiring no intervention.  Fall reduction and safety interventions were maintained, and no falls or injuries occurred.  Skin protection and regular repositioning were performed throughout the shift.  Overall, therapy delivery and safety measures were maintained, and no new hospital-acquired issues were documented during the shift.    Safe, Effective Therapy Delivery: Insulin  lispro was administered after a high blood glucose reading, and the calculated dose was delivered as ordered; peripheral IV site remained clean, dry, and intact with no interventions required during the shift. Therapy delivery was consistent with orders and no complications were documented.    Absence of Hospital-Acquired Illness or Injury: Temperature remained within normal limits and skin protection measures were maintained throughout the shift; no new hospital-acquired issues were documented.    Absence of Fall and Fall-Related Injury: Fall reduction program and safety interventions were consistently maintained, with regular toileting and hourly visual checks performed; no falls or injuries were documented during the shift.    Improved Ability to Complete Activities of Daily Living: Minimal assistance was required for general hygiene and bathing, while oral care was performed independently; mobility remained slightly limited but independent, and pain was consistently rated at zero.

## 2024-05-28 NOTE — Plan of Care (Signed)
 Shift Summary  Sevelamer  and cholecalciferol  were administered, while lactulose  was refused during the shift.  Discharge medication reconciliation was completed in the afternoon.  Pain remained at 0 and no pain interventions were needed.  Fall prevention and safety interventions were consistently maintained with no falls documented.  The patient remained stable with no significant changes in comfort, safety, or ability to complete activities of daily living during the shift.    Safe, Effective Therapy Delivery: Sevelamer  and cholecalciferol  were administered as ordered, but lactulose  was refused; discharge medication reconciliation was completed.    Optimal Comfort and Wellbeing: Pain was consistently reported as 0 throughout the shift, and no interventions for pain were required.    Absence of Fall and Fall-Related Injury: Fall prevention interventions such as low bed position, lighting adjustments, and scheduled toileting were maintained throughout the shift, and hourly visual checks documented the patient awake and in bed with no falls reported.    Improved Ability to Complete Activities of Daily Living: Mobility remained slightly limited, requiring minimal assistance for hygiene and assistance with bathing, but oral care was performed independently and the patient was independent with activity and mobility.

## 2024-05-28 NOTE — Procedures (Signed)
 Nephrology ESKD Follow-up Consult Note    Assessment/Recommendations: Richard [name] is a 58 y.o. male with a past medical history notable for ESKD on in-center hemodialysis admitted with HE.     # ESKD:  - [name] indications for urgent/emergent HD today based upon volume status, electrolytes or acidemia. We will continue hemodialysis thrice weekly. Please see orders for most up to date prescription. He anticipates DC today and will dialyze tomorrow if so.  - Volume: Will attempt to achieve dry weight if tolerated  - Medication list currently appropriate  - Vascular access functioning well- [name] indication for intervention.  Current dialysis Rx:   EDW:115.6 kg (254 lb 13.6 oz)     Dialyzer:F-180 (98 mLs)   Bath:2 K+ / 2.5 Ca+  ;  Na:137 mEq/L  ;  CO2:35 mEq/L   Recent labs:  Na/K/Cl/CO2:139/3.4/96*/24.0 (12/03 0352)    # Anemia of Chronic Kidney Disease:   - Will continue outpatient management with ESA and adjust as indicated.  Lab Results   Component Value Date    HGB 10.7 (L) 05/28/2024    HGB 10.6 (L) 05/27/2024    HGB 10.9 (L) 05/26/2024     Lab Results   Component Value Date    LABIRON 36 11/09/2022    TIBC 267.1 11/09/2022    FERRITIN 169.8 11/09/2022       Discussed recommendations with primary team.  Thanks for this consult, we will continue to follow along and provide recommendations as needed.    *Please contact Nephrology PRIOR to hospital discharge so we can assist the primary team and ensure appropriate dialysis related follow-up (ie scheduling, antibiotics, changes in EDW and dialysis access, etc.).Richard [name], Richard [name]  05/28/2024 1:50 PM   Medical decision-making for 05/28/24  Findings / Data     Patient has: []  acute illness w/systemic sxs  [mod]  []  two or more stable chronic illnesses [mod]  []  one chronic illness with acute exacerbation [mod]  []  acute complicated illness  [mod]  []  Undiagnosed new problem with uncertain prognosis  [mod] [x]  illness posing risk to life or bodily function (ex. AKI)  [high]  []  chronic illness with severe exacerbation/progression  [high]  []  chronic illness with severe side effects of treatment  [high] ESKD on RRT Probs At least 2:  Probs, Data, Risk   Mgm't requires: []  Prescription drug(s)  [mod]  []  Kidney biopsy  [mod]  []  Central line placement  [mod] []  High risk medication use and/or intensive toxicity monitoring [high]  [x]  Renal replacement therapy [high]  []  High risk kidney biopsy  [high]  []  Escalation of care  [high]  []  High risk central line placement  [high] RRT: High risk of complications from RRT requiring intensive monitoring Risk      _____________________________________________________________________________________    Subjective/Interval Events:  Feeling great - looking forward to potential DC today.    Physical Exam:  Vitals:    05/27/24 0731 05/27/24 1611 05/27/24 2352 05/28/24 0754   BP: 136/87 169/99 152/89 156/81   Pulse: 61 78 86 78   Resp: 18 18 16 18    Temp: 36.4 ??C (97.5 ??F) 36.7 ??C (98.1 ??F) 37 ??C (98.6 ??F) 36.8 ??C (98.2 ??F)   TempSrc: Oral Oral Oral Oral   SpO2: 98% 98% 96% 94%   Weight:       Height:         [name] intake/output data recorded.  [name] intake or output data in the 24 hours  ending 05/28/24 1350  General: well-appearing, [name] acute distress. [name] asterixis.   CV: warm extr  Lungs: normal work of breathing  Ext: minimal LE edema

## 2024-05-28 NOTE — Discharge Summary (Signed)
 Physician Discharge Summary [facility]  1 [facility] OBSERVATION [facility]  [address]  [place] [address]  Dept: [phone]  Loc: [phone]     Identifying Information:   Richard [name]  07/08/1965  [id number]    Primary Care Physician: Jama Mayo, MD     Code Status: Full Code    Admit Date: 05/25/2024    Discharge Date: 05/28/2024     Discharge To: Home    Discharge Service: [facility] - General Medicine Floor Team (MED LELON GLENWOOD Balls)     Discharge Attending Physician:  Ashok Hutching    Discharge Diagnoses:   Principal Problem (Resolved):    Encephalopathy (POA: Yes)  Active Problems:    Hypertension (POA: Yes)    Type 2 diabetes mellitus with hyperglycemia, with long-term current use of insulin  (CMS-HCC) (POA: Not Applicable)    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) (POA: Not Applicable)    ESRD (end stage renal disease) on dialysis    (CMS-HCC) (POA: Not Applicable)    (HFpEF) heart failure with preserved ejection fraction (CMS-HCC) (POA: Yes)    CAD (coronary artery disease) (POA: Yes)    Outpatient Provider Follow Up Issues:   [ ]  Outpatient hepatology to follow-up lactulose  regimen, patient prefers packets  [ ]  VIR splenic artery embolization    Hospital Course:         Hospital Course:  Richard [name] is a 58 y.o. 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       Encephalopathy  Patient presented ~1 day after the onset of AMS that began on 11/29. He had missed his HD appointment on the day of symptom onset, though collateral from patient's sister suggests AMS preceeded (and caused) missed dialysis. Notably, he has had recurrent admissions for hepatic encephalopathy in the setting of a large portosystemic shunt prior to this admission, however it had been recently well controlled off lactulose . On presentation to ED patient was somnolent, oriented to self, and intermittently following commands. CT head without acute process. Neuro exam without gross focal deficits. Lab work-up notable for BUN 55 from baseline 20-30. No ascites per ED POCUS. CXR overall unremarkable. He was started briefly on empiric antibiotic treatment for SBP, however given the lack of ascites and infectious symptoms, antibiotics were discontinued. Lactulose  was initiated on admission and titrated as appropriate. His home regimen of rifaximin  was continued as below. Nephrology was consulted and continued to provide recommendations for care. Patient received first inpatient hemodialysis on 12/1, with plan to continue per outpatient dialysis schedule, though patient was ultimately discharged prior to subsequent session. KUB obtained 12/1 and was overall unremarkable. Significant improvement in mentation noted with continued medical management. Overall highest clinical suspicion remained for hepatic encephalopathy, though no overt precipitating factor identified given negative infectious workup and stable labs. Per nephrology, low suspicion for uremic component causing encephalopathy.       ESRD on iHD TTS - Hyperkalemia   Etiology of ESRD due to CNI toxicity and HTN. He had issues tolerating HD due to catheter complications that were resolved (per chart review, he underwent catheter exchange and balloon angioplasty 11/25). Last HD session prior to admission was ~11/27, missed 11/29 HD session. Potassium elevated to 5.7 on admission. LIJ in place with surrounding ecchymosis on exam. Per nephrology recommendations, he received  dialysis inpatient as above. His home regimen of sevelemer and vitamin D supplement were continued. Electrolyte levels were monitored closely with daily labs. Continued regular outpatient dialysis schedule outpatient.        [name] Cirrhosis s/p OLT (02/2012)  Patient is established with [facility] (Dr. Lennette). No history of rejection or biliary issues, however has had multiple prior admissions for hepatic encephalopathy in the setting of large portosystemic shunt demonstrated on 04/16/2024 CTAP. HE well controlled off lactulose  prior to this admission. 01/2024 MR elastography with stage 3 fibrosis. Per chart review, VIR to consider embolization of gastrocaval shunt for refractory HE. Home regimen tacrolimus , cellcept , ursodiol , and rifaximin  continued. Hepatology was consulted and recommended continued medical management with lactulose  and rifaximin  as above.      T2DM  Hyperglycemia to 200s with A1c 10.6% on admission. VBG with normal pH. Reportedly had not eaten in the last couple days prior to admission. Suspected home regimen: tresiba  70u daily, aspart 30u TID, mounjaro  15mg  weekly. Home basal insulin  regimen and sliding scale insulin  resumed inpatient, with careful monitoring of blood glucose levels.    Splenic artery aneurysm: Scheduled embolization with VIR 05/2024 given risk of rupture.   Hx DVT: Prior DVT in RLE and more recently LIJ. Home eliquis  resumed on hospital day 2  Hx of OSA: No longer uses CPAP.   CAD: NM spect in 2024 with moderate sized, mild in severity, fixed perfusion defect mid anterior, apical anterior and apical segments  The patient's hospital stay has been complicated by the following clinically significant conditions requiring additional evaluation and treatment or having a significant effect of this patient's care: - Anemia POA requiring further investigation or monitoring  - Chronic kidney disease POA requiring further investigation, treatment, or monitoring  - Hyperkalemia POA requiring further investigation, treatment, or monitoring       Touchbase with Outpatient Provider:  Warm Handoff: Completed on 05/28/24 by Alyce Bend, MD  (Intern) via [facility] Message    Procedures:  dialysis  ______________________________________________________________________  Discharge Medications:     Your Medication List        STOP taking these medications      sevelamer  800 MG tablet  Commonly known as: RENAGEL   Replaced by: sevelamer  800 mg tablet            START taking these medications      lactulose  20 gram packet  Commonly known as: CEPHULAC   Take 1 packet (20 g total) by mouth Three (3) times a day. Titrate to 3-5 bowel movements per day.     sevelamer  800 mg tablet  Commonly known as: RENVELA   Take 3 tablets (2,400 mg total) by mouth Three (3) times a day with a meal.  You may have different instructions for this medication elsewhere on this list. Ask your doctor how you should be taking this medication.  Replaces: sevelamer  800 MG tablet            CONTINUE taking these medications      blood-glucose meter kit  Use as directed to check blood sugars     CONTOUR NEXT EZ METER Misc  Generic drug: blood-glucose meter  Use as instructed     CONTOUR NEXT TEST STRIPS Strp  Generic drug: blood sugar diagnostic  Use to check blood sugar as directed with insulin  3 times a day and for symptoms of high or low blood sugar.     DEXCOM G6 RECEIVER Misc  Generic drug: blood-glucose,receiver,cont  1 each by Miscellaneous route  in the morning. Dispense 1 receiver annually.  Sent to [facility].     DEXCOM G7 SENSOR Devi  Generic drug: blood-glucose sensor  Use to continuously monitor blood glucose.  Change sensor every 10 days.     ELIQUIS  5 mg Tab  Generic drug: apixaban   Take 1 tablet (5 mg total) by mouth two (2) times a day.     gabapentin  300 MG capsule  Commonly known as: NEURONTIN   Take 1 capsule (300 mg total) by mouth nightly.     insulin  aspart 100 unit/mL (3 mL) injection pen  Commonly known as: NovoLOG  Flexpen U-100 Insulin   Inject 0.3 mL (30 Units total) under the skin Three (3) times a day before meals.     insulin  degludec 200 unit/mL (3 mL) Inpn  Commonly known as: TRESIBA  FLEXTOUCH U-200  Inject 0.35 mL (70 Units total) under the skin daily. Max dose of 100 units per day.     If fasting glucose is greater than 180, increase basal insulin  by 5 units. Recheck fasting glucose the following morning and continue to increase by 5 units daily until fasting sugar less than 180.     MICROLET LANCET Misc  Generic drug: lancets  Use to check blood sugar as directed with insulin  3 times a day and for symptoms of high or low blood sugar.     MOUNJARO  15 mg/0.5 mL Pnij  Generic drug: tirzepatide   Inject 15 mg under the skin every seven (7) days.     mycophenolate  250 mg capsule  Commonly known as: CELLCEPT   Take 1 capsule (250 mg total) by mouth two (2) times a day.     pantoprazole  40 MG tablet  Commonly known as: Protonix   Take 1 tablet (40 mg total) by mouth daily as needed.     PROGRAF  0.5 mg capsule  Generic drug: tacrolimus   Take 1 capsule (0.5 mg total) by mouth two (2) times a day.     rifAXIMin  550 mg Tab  Commonly known as: XIFAXAN   Take 1 tablet (550 mg total) by mouth two (2) times a day.     TRUEPLUS PEN NEEDLE 32 gauge x 5/32 (4 mm) Ndle  Generic drug: pen needle, diabetic  Use with insulin  up to 4 times a day as needed.     pen needle, diabetic 32 gauge x 5/32 (4 mm) Ndle  Use with insulin  up to 4 times daily as needed.     ursodiol  300 mg capsule  Commonly known as: ACTIGALL   Take 2 capsules (600 mg total) by mouth two (2) times a day.     VITAMIN D3 ORAL  Take 2,000 Units by mouth in the morning.              Allergies:  Patient has no known allergies.  ______________________________________________________________________  Pending Test Results:  Pending Labs       Order Current Status    Blood Culture #1 Preliminary result    Blood Culture #2 Preliminary result            Most Recent Labs:  All lab results last 24 hours -   Recent Results (from the past 24 hours)   POCT Glucose    Collection Time: 05/27/24  5:18 PM   Result Value Ref Range    Glucose, POC 151 70 - 179 mg/dL   POCT Glucose    Collection Time: 05/27/24  8:02 PM   Result Value Ref Range    Glucose, POC 278 (H) 70 -  179 mg/dL   CBC    Collection Time: 05/28/24  3:52 AM   Result Value Ref Range    WBC 4.6 3.6 - 11.2 10*9/L    RBC 3.82 (L) 4.26 - 5.60 10*12/L    HGB 10.7 (L) 12.9 - 16.5 g/dL    HCT 67.6 (L) 60.9 - 48.0 %    MCV 84.7 77.6 - 95.7 fL    MCH 28.1 25.9 - 32.4 pg    MCHC 33.1 32.0 - 36.0 g/dL    RDW 85.4 87.7 - 84.7 %    MPV 8.3 6.8 - 10.7 fL    Platelet 209 150 - 450 10*9/L   Comprehensive Metabolic Panel    Collection Time: 05/28/24  3:52 AM   Result Value Ref Range    Sodium 139 135 - 145 mmol/L    Potassium 3.4 3.4 - 4.8 mmol/L    Chloride 96 (L) 98 - 107 mmol/L    CO2 24.0 20.0 - 31.0 mmol/L    Anion Gap 19 (H) 5 - 14 mmol/L    BUN 36 (H) 9 - 23 mg/dL    Creatinine 89.51 (H) 0.73 - 1.18 mg/dL    BUN/Creatinine Ratio 3     eGFR CKD-EPI (2021) Male 5 (L) >=60 mL/min/1.4m2    Glucose 152 70 - 179 mg/dL    Calcium  9.4 8.7 - 10.4 mg/dL    Albumin 3.3 (L) 3.4 - 5.0 g/dL    Total Protein 6.8 5.7 - 8.2 g/dL    Total Bilirubin 0.6 0.3 - 1.2 mg/dL    AST 22 <=65 U/L    ALT <7 (L) 10 - 49 U/L    Alkaline Phosphatase 142 (H) 46 - 116 U/L   Magnesium  Level    Collection Time: 05/28/24  3:52 AM   Result Value Ref Range    Magnesium  2.4 1.6 - 2.6 mg/dL   Phosphorus Level    Collection Time: 05/28/24  3:52 AM   Result Value Ref Range    Phosphorus 8.6 (H) 2.4 - 5.1 mg/dL   POCT Glucose    Collection Time: 05/28/24  7:55 AM   Result Value Ref Range    Glucose, POC 171 70 - 179 mg/dL   POCT Glucose    Collection Time: 05/28/24 12:00 PM   Result Value Ref Range    Glucose, POC 158 70 - 179 mg/dL       Relevant Studies/Radiology:  XR Abdomen 1 View  Result Date: 05/27/2024  EXAM: XR ABDOMEN 1 VIEW ACCESSION: [id number] UN REPORT DATE: 05/27/2024 10:00 AM CLINICAL INDICATION: 58 years old with CONSTIPATION  COMPARISON: CT abdomen pelvis 04/16/2024 TECHNIQUE: Supine view of the abdomen, 2 image(s) FINDINGS: Round hyperdensity projecting over the right lower quadrant, likely external to the patient. The left lower quadrant is partially occluded from field-of-view. Nonobstructed bowel gas pattern with a minimal amount of stool in the colon. No visualized dilated loops of proximal bowel. No acute osseous abnormality. Limited evaluation of the lung bases due to patient positioning.     Nonobstructed bowel gas pattern with a minimal amount of stool in the colon.    ECG 12 Lead  Result Date: 05/26/2024  ATRIAL TACHYCARDIA TRANSITIONING TO SINUS RHYTHM WITH FIRST DEGREE AV BLOCK RIGHT BUNDLE BRANCH BLOCK POSSIBLE LATERAL INFARCT  (CITED ON OR BEFORE 16-Apr-2024) ABNORMAL ECG WHEN COMPARED WITH ECG OF 16-Apr-2024 18:26, ATRIAL TACHYCARDIA IS PRESENT CRITERIA FOR INFERIOR INFARCT ARE NO LONGER PRESENT QT HAS LENGTHENED Confirmed by Leni Mings ([id number]) on 05/26/2024  7:46:56 PM    CT Head Wo Contrast  Result Date: 05/25/2024  EXAM: Computed tomography, head or brain without contrast material. ACCESSION: [id number] UN CLINICAL INDICATION: 58 years old Male with altered mental status  COMPARISON: None TECHNIQUE: Axial CT images of the head from skull base to vertex without contrast. FINDINGS: Age-advanced global cerebral volume loss without lobar predilection with ex vacuo ventricular dilatation. There are scattered hypodense foci within the periventricular and deep white matter.  These are nonspecific but commonly associated with small vessel ischemic changes. There is no midline shift. No mass lesion. There is no evidence of acute infarct.  The sinuses are pneumatized. Carotid siphon calcifications. No intracranial hemorrhage or skull fractures.     No acute intracranial abnormality. Age-advanced global cerebral volume loss without lobar predilection.     XR Chest Portable  Result Date: 05/25/2024  EXAM: XR CHEST PORTABLE ACCESSION: [id number] UN REPORT DATE: 05/25/2024 6:58 PM CLINICAL INDICATION: ALTERED MENTAL STATUS  TECHNIQUE: Single View AP Chest Radiograph. COMPARISON: Chest radiograph 04/16/2024 FINDINGS: Unchanged left internal jugular approach central venous hemodialysis catheter with tip overlying the right atrium. Low lung volumes. New heterogeneous bilateral opacities. No pleural effusion or pneumothorax. Suggestion of cardiomegaly, which is unchanged. Unchanged contours.     New heterogeneous bilateral opacities, which may represent pulmonary edema or infection.     ______________________________________________________________________  Discharge Instructions:   Activity Instructions       Activity as tolerated                  Follow Up instructions and Outpatient Referrals     Call MD for:  persistent nausea or vomiting      Call MD for:  severe uncontrolled pain      Call MD for: Temperature > 38.5 Celsius ( > 101.3 Fahrenheit)          Appointments which have been scheduled for you      Jun 23, 2024 7:40 AM  (Arrive by 6:40 AM)  IR Embolization of an artery (other than for hemorrhage) with [facility] IR 6  IM[facility] VASCULAR INTERVENTIONAL [facility]) [address]  [place] [address]  [phone]   On appointment date:  Come with adult to accompany patient home  Bring recent lab work  Bring any meds you take  Take meds with small sip of water  Check with physician about current meds  Check with physician if diabetic  Check with physician if patient takes blood thinners  Arrive 1 hour early    On appointment date do not:  Consume solids after midnight  Consume anything 2 hours    Let us  know if patient:  Pregnant  Allergic to iodine  or contrast dyes  Prior arterial or vascular graft operations  History of unstable angina           Aug 19, 2024 11:00 AM  (Arrive by 10:45 AM)  RETURN HEPATOLOGY with Vena Lowers, MD  [facility] MEDICINE [place] [facility]) [address]  [place] [address]  [place] [address]  [phone]             ______________________________________________________________________  Discharge Day Services:  BP 156/81  - Pulse 78  - Temp 36.8 ??C (98.2 ??F) (Oral)  - Resp 18  - Ht 177.8 cm (5' 10)  - Wt (!) 120 kg (264 lb 8.8 oz)  - SpO2 94%  - BMI 37.96 kg/m??     Pt seen  on the day of discharge and determined appropriate for discharge.    Condition at Discharge: good    Length of Discharge: I spent less than 30 mins in the discharge of this patient.

## 2024-05-29 DIAGNOSIS — Z09 Encounter for follow-up examination after completed treatment for conditions other than malignant neoplasm: Principal | ICD-10-CM

## 2024-05-29 MED FILL — DEXCOM G7 SENSOR DEVICE: ORAL | 30 days supply | Qty: 3 | Fill #3

## 2024-05-29 MED FILL — MOUNJARO 15 MG/0.5 ML SUBCUTANEOUS PEN INJECTOR: SUBCUTANEOUS | 28 days supply | Qty: 2 | Fill #3

## 2024-06-06 DIAGNOSIS — Z944 Liver transplant status: Principal | ICD-10-CM

## 2024-06-06 DIAGNOSIS — Z796 Long-term use of immunosuppressant medication: Principal | ICD-10-CM

## 2024-06-06 MED ORDER — TACROLIMUS 0.5 MG CAPSULE, IMMEDIATE-RELEASE
ORAL_CAPSULE | Freq: Two times a day (BID) | ORAL | 3 refills | 180.00000 days
Start: 2024-06-06 — End: ?

## 2024-06-06 MED FILL — LACTULOSE 20 GRAM ORAL PACKET: ORAL | 20 days supply | Qty: 60 | Fill #0

## 2024-06-06 MED FILL — GABAPENTIN 300 MG CAPSULE: ORAL | 30 days supply | Qty: 30 | Fill #7

## 2024-06-06 NOTE — Progress Notes (Signed)
 [facility] Refill Coordination Note    Specialty Medication(s) to be Shipped:   Transplant: mycophenolate  mofetil 250 mg and Prograf  0.5 mg    Other medication(s) to be shipped: No additional medications requested for fill at this time    Specialty Medications not needed at this time: N/A     Richard [name], DOB: March 29, 1966  Phone: There are no phone numbers on file.      All above HIPAA information was verified with patient.     Was a nurse, learning disability used for this call? No    Completed refill call assessment today to schedule patient's medication shipment from the [facility]  [phone]).  All relevant notes have been reviewed.     Specialty medication(s) and dose(s) confirmed: Patient reports changes to the regimen as follows: tacrolimus  2 qam and 1 at bedtime    Changes to medications: Kanin reports no changes at this time.  Changes to insurance: No  New side effects reported not previously addressed with a pharmacist or physician: None reported  Questions for the pharmacist: No    Confirmed patient received a Conservation Officer, Historic Buildings and a Surveyor, Mining with first shipment. The patient will receive a drug information handout for each medication shipped and additional FDA Medication Guides as required.       DISEASE/MEDICATION-SPECIFIC INFORMATION        N/A    SPECIALTY MEDICATION ADHERENCE     Medication Adherence    Patient reported X missed doses in the last month: 0  Specialty Medication: tacrolimus : PROGRAF  0.5 mg capsule  Patient is on additional specialty medications: Yes  Additional Specialty Medications: mycophenolate  250 mg capsule (CELLCEPT )  Patient Reported Additional Medication X Missed Doses in the Last Month: 0  Patient is on more than two specialty medications: No  Adherence tools used: patient uses a pill box to manage medications              Were doses missed due to medication being on hold? No    mycophenolate  250 mg capsule (CELLCEPT )  5 days of medicine on hand   tacrolimus : PROGRAF  0.5 mg capsule  5 days of medicine on hand       Specialty medication is an injection or given on a cycle: No    REFERRAL TO PHARMACIST     Referral to the pharmacist: Not needed      [facility]     Shipping address confirmed in Epic.     Cost and Payment: Patient has a $0 copay, payment information is not required.    Delivery Scheduled: Yes, Expected medication delivery date: 06/11/2024.     Medication will be delivered via UPS to the prescription address in Epic WAM.    Nelida Winfred [facility]  Specialty Technician

## 2024-06-09 MED ORDER — TACROLIMUS 0.5 MG CAPSULE, IMMEDIATE-RELEASE
ORAL_CAPSULE | Freq: Two times a day (BID) | ORAL | 3 refills | 180.00000 days | Status: CP
Start: 2024-06-09 — End: ?
  Filled 2024-06-10: qty 60, 30d supply, fill #0

## 2024-06-10 MED FILL — MYCOPHENOLATE MOFETIL 250 MG CAPSULE: ORAL | 30 days supply | Qty: 60 | Fill #4

## 2024-06-16 NOTE — Telephone Encounter (Signed)
 VIR pre embo prep call completed. Reviewed to register at [phone] on ground floor of [facility] hospital then proceed [facility].  Informed of no show/late cancellation policy. NPO guidelines reviewed. Pt OK to take sips of clear liquids with all AM meds.  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 Made aware of visitation policy.  Pt verbalized understanding. All questions answered.     Diabetic medication guidelines reviewed.

## 2024-06-23 ENCOUNTER — Inpatient Hospital Stay: Admit: 2024-06-23 | Discharge: 2024-06-24 | Payer: BLUE CROSS/BLUE SHIELD

## 2024-06-23 ENCOUNTER — Ambulatory Visit: Admit: 2024-06-23 | Discharge: 2024-06-24 | Payer: BLUE CROSS/BLUE SHIELD

## 2024-06-23 ENCOUNTER — Encounter
Admit: 2024-06-23 | Discharge: 2024-06-24 | Payer: BLUE CROSS/BLUE SHIELD | Attending: Anesthesiology | Primary: Anesthesiology

## 2024-06-23 LAB — POTASSIUM: POTASSIUM: 4.1 mmol/L (ref 3.4–4.8)

## 2024-06-23 LAB — PROTIME-INR
INR: 0.98
PROTIME: 11.2 s (ref 9.9–12.6)

## 2024-06-23 MED ADMIN — ePHEDrine (PF) 25 mg/5 mL (5 mg/mL) in 0.9% sodium chloride syringe: INTRAVENOUS | @ 13:00:00 | Stop: 2024-06-23

## 2024-06-23 MED ADMIN — ePHEDrine (PF) 25 mg/5 mL (5 mg/mL) in 0.9% sodium chloride syringe: INTRAVENOUS | @ 15:00:00 | Stop: 2024-06-23

## 2024-06-23 MED ADMIN — sugammadex (BRIDION) injection: INTRAVENOUS | @ 19:00:00 | Stop: 2024-06-23

## 2024-06-23 MED ADMIN — fentaNYL (PF) (SUBLIMAZE) injection 25 mcg: 25 ug | INTRAVENOUS | @ 20:00:00 | Stop: 2024-06-23

## 2024-06-23 MED ADMIN — ROCuronium (ZEMURON) injection: INTRAVENOUS | @ 13:00:00 | Stop: 2024-06-23

## 2024-06-23 MED ADMIN — ROCuronium (ZEMURON) injection: INTRAVENOUS | @ 15:00:00 | Stop: 2024-06-23

## 2024-06-23 MED ADMIN — ROCuronium (ZEMURON) injection: INTRAVENOUS | @ 18:00:00 | Stop: 2024-06-23

## 2024-06-23 MED ADMIN — ROCuronium (ZEMURON) injection: INTRAVENOUS | @ 16:00:00 | Stop: 2024-06-23

## 2024-06-23 MED ADMIN — ROCuronium (ZEMURON) injection: INTRAVENOUS | @ 17:00:00 | Stop: 2024-06-23

## 2024-06-23 MED ADMIN — Propofol (DIPRIVAN) injection: INTRAVENOUS | @ 13:00:00 | Stop: 2024-06-23

## 2024-06-23 MED ADMIN — iohexol (OMNIPAQUE) 300 mg iodine/mL solution 234 mL: 234 mL | INTRA_ARTERIAL | @ 19:00:00 | Stop: 2024-06-23

## 2024-06-23 MED ADMIN — ceFAZolin (ANCEF) injection: INTRAVENOUS | @ 14:00:00 | Stop: 2024-06-23

## 2024-06-23 MED ADMIN — ceFAZolin (ANCEF) injection: INTRAVENOUS | @ 18:00:00 | Stop: 2024-06-23

## 2024-06-23 MED ADMIN — phenylephrine 1 mg/10 mL (100 mcg/mL) injection Syrg: INTRAVENOUS | @ 14:00:00 | Stop: 2024-06-23

## 2024-06-23 MED ADMIN — heparin (porcine) 1000 unit/mL injection: INTRAVENOUS | @ 18:00:00 | Stop: 2024-06-23

## 2024-06-23 MED ADMIN — heparin (porcine) 1000 unit/mL injection: INTRAVENOUS | @ 15:00:00 | Stop: 2024-06-23

## 2024-06-23 MED ADMIN — heparin (porcine) 1000 unit/mL injection: INTRAVENOUS | @ 17:00:00 | Stop: 2024-06-23

## 2024-06-23 MED ADMIN — dexAMETHasone (DECADRON) 4 mg/mL injection: INTRAVENOUS | @ 13:00:00 | Stop: 2024-06-23

## 2024-06-23 MED ADMIN — propofol (DIPRIVAN) infusion 10 mg/mL: INTRAVENOUS | @ 13:00:00 | Stop: 2024-06-23

## 2024-06-23 MED ADMIN — lactated Ringers infusion: INTRAVENOUS | @ 13:00:00 | Stop: 2024-06-23

## 2024-06-23 MED ADMIN — HYDROmorphone (PF) (DILAUDID) injection 0.2 mg: .2 mg | INTRAVENOUS | @ 22:00:00 | Stop: 2024-06-23

## 2024-06-23 MED ADMIN — lidocaine (PF) (XYLOCAINE-MPF) 20 mg/mL (2 %) injection: INTRAVENOUS | @ 13:00:00 | Stop: 2024-06-23

## 2024-06-23 MED ADMIN — protamine injection: INTRAVENOUS | @ 19:00:00 | Stop: 2024-06-23

## 2024-06-23 MED ADMIN — midazolam (VERSED) injection: INTRAVENOUS | @ 13:00:00 | Stop: 2024-06-23

## 2024-06-23 MED ADMIN — acetaminophen (OFIRMEV) 10 mg/mL injection: INTRAVENOUS | @ 19:00:00 | Stop: 2024-06-23

## 2024-06-23 MED ADMIN — ondansetron (ZOFRAN) injection: INTRAVENOUS | @ 19:00:00 | Stop: 2024-06-23

## 2024-06-23 MED ADMIN — succinylcholine chloride (ANECTINE) injection: INTRAVENOUS | @ 13:00:00 | Stop: 2024-06-23

## 2024-06-23 NOTE — Op Note (Cosign Needed)
 [facility] - Operative Note     VIR Post-Procedure Note    Procedure Name: Splenic arteriogram, stenting of splenic artery aneurysm    Pre-Op Diagnosis: Splenic artery aneurysm in setting of liver transplant    Post-Op Diagnosis: Same as pre-operative diagnosis    VIR Providers    Attending Physician: Gerard Gaskins, MD    Fellow/Resident: Buena ONEIDA Pouch, MD    Time out: Prior to the procedure, a time out was performed with all team members present. During the time out, the patient, procedure and procedure site when applicable were verbally verified by the team members, Gerard Gaskins, MD and Buena ONEIDA Pouch, MD.    Description of procedure:   Right CFA access, splenic arteriogram demonstrated wide neck aneurysm of the upper pole branch of the splenic artery. Anticoagulated with heparin  for ACT >230. Very challenging/tortuous splenic artery. Eventually, was able to place a 6mm x 2.5cm Viabhan centered over the aneurysm with preservation of the other dominant branches of the spleen. 10mg  Protamine for reversal of heparin . The right CFA was closed with a Perclose ProStyle with hemostasis.     Sedation:General Anesthesia    Estimated Blood Loss: approximately 20 mL  Complications: None    See detailed procedure note with images in PACS [facility]).    The patient tolerated the procedure well without incident or complication and left the room in stable condition.    Plan:  --2 hours flat  --3 month CTA   --can resume eliquis  tomorrow morning    Buena ONEIDA Pouch, MD  06/23/2024 2:41 PM

## 2024-06-23 NOTE — H&P (Signed)
 [facility] Medicine   History and Physical       Assessment and Plan     Richard [name] is a 58 y.o. 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    Present on Admission:   Splenic artery aneurysm   Hypertension   Cirrhosis of transplanted liver    (CMS-HCC)   Hypertensive heart and chronic kidney disease with heart failure and with stage 5 chronic kidney disease, or end stage renal disease (CMS-HCC)   DVT (deep venous thrombosis) (CMS-HCC)   Paroxysmal atrial fibrillation    (CMS-HCC)   Diabetes mellitus (CMS-HCC)   Periumbilical abdominal pain      Splenic artery aneurysm   Patient presented for scheduled splenic artery aneurysm embolization with VIR on 12/29.  Patient successfully underwent stenting of tortuous clinic artery.  No embolization procedure necessary.  Postprocedure patient was complaining of severe abdominal pain and generally feeling unwell and did not want to go home.  Admitted overnight to medicine team for observation.  Received 0.2 mcg Dilaudid  for pain  - VIR managing, recs appreciated   - 1mg  IV dilaudid  for pain x 1 (can consider additional doses as needed for PO pain meds)  - oxycodone  5mg -10mg  panel for post op pain   - Lay flat 2 hours post procedure  - 78-month follow-up CTA  - Resume Eliquis  5 mg twice daily tomorrow morning (clarify if patient was taking)     Abdominal Pain   Umbilical Hernia  Prior to presenting for splenic artery stenting, patient reported ongoing periumbilical abdominal pain.  He denies association with eating.  Pain comes and goes.  Reports regular bowel movements at least 3 times a day. Last CT A/P 03/2024 showed sequela of portal hypertension with extensive abdominal varices with partially thrombosed 2.6 cm distal splenic artery aneurysm which was stented with VIR on 12/29.  Also noted to have a small fat-containing umbilical hernia with adjacent intra-abdominal fat stranding.   - Consider referral to surgery outpatient for evaluation of surgical management of umbilical hernia    [name] Cirrhosis s/p OLT (02/2012)  Established with [facility] (Dr. Lennette). No history of rejection or biliary issues. however has had multiple prior admissions for hepatic encephalopathy in the setting of large portosystemic shunt demonstrated on 04/16/2024 CTAP Last admission for HE 11/30 -12/3. Reports having 3 BM per day. Has not taken lactulose  in over 1 month. 01/2024 MR elastography with stage 3 fibrosis. Per chart review, VIR to consider embolization of gastrocaval shunt for refractory HE.   - Hepatology consult in AM   - Continue tacrolimus  0.5mg  BID (goal 2-4)   - Follow up tac level in the AM   - Continue cellcept  250mg  BID  - Continue ursodiol  600mg  BID   - Continue rifaximin  BID, additional HE management as above    Secondary/Additional Active Problems:  ESRD on iHD TTS - Hyperkalemia   Etiology of ESRD 2/2 CNI toxicity and HTN. He had issues tolerating HD due to catheter complications that were resolved (per chart review s/p catheter exchange and balloon angioplasty 11/25). Last HD session 12/27. Left tunnel internal jugular  iHD line.   - Consult Nephrology in the AM for HD   - Continue sevelemer 800mg  TID   - Continue vitamin D supplement   - Daily BMP,  Mg, Phos    T2DM  Patient reports sliding scale for both his long acting and meal time insulin . Reports tresiba  20u- 70u nightly based on BG, aspart ~30U TID, mounjaro  15mg  weekly.   - Titrate as needed   - Basal: 20U lantus    - Nutritional: 10U lispro   - Correctional: SSI   - POC ACHS  - Hypoglycemia protocol    Hx unprovoked DVT: Prior DVT in [facility] and more recently [facility]. Patient denies taking Eliquis  at home. Unsure if he was confused post op given he had held the Eliquis  iso surgery.   -Restart Eliquis  5mg  BID tomorrow per VIR recs   Hx of OSA: No longer uses CPAP.   CAD: NM spect in 2024 with moderate sized, mild in severity, fixed perfusion defect mid anterior, apical anterior and apical segments    Prophylaxis  -SCD    Diet  -Nutrition Therapy Regular/House    Code Status / HCDM  -Full Code, Discussed with patient at the time of admission   -  HCDM (patient stated preference): [name] - Sister - [phone]    Anticipated Medically Ready for Discharge: Anticipated Tomorrow    Significant Comorbid Conditions:     -Anemia POA requiring further investigation or monitoring  -Chronic kidney disease POA requiring further investigation, treatment, or monitoring    Issues Impacting Complexity of Management:  -Parenteral controlled medications: IV Dilaudid   -Intensive monitoring of drug toxicity from Tacrolimus  with scheduled levels and Mycophenolate  mofetil (Cellcept ) with scheduled levels  -High risk of complications from pain and/or analgesia likely to result in delirium    Medical Decision Making: Reviewed records from the following unique sources reviewed records from previous hospitalization in early December for hepatic encephalopathy.  Reviewed VIR admission H&P.  Patient presenting for admission of abdominal pain post splenic artery stenting.  Plan to control pain with p.o. regimen and IV pushes as needed.    HPI      Richard [name] is a 58 y.o. male who is presenting to [facility] with Splenic artery aneurysm.    Patient presented today for a scheduled splenic artery aneurysm embolization procedure with VIR.  Previous abdominal CT from October showed 2.6 cm hilar splenic artery aneurysm.  Patient underwent successful placement of a 6 mm x 2.5 cm clinic artery stent stent on 12/29.  Patient reported significant abdominal pain postprocedure warranting admission for overnight observation.     Patient noted that even prior to stent placement he was having abdominal pain.  He reports the pain localizes to the periumbilical region.  He reports the pain is worse after dialysis.  CT A/P in October did demonstrate periumbilical fat hernia which is likely contributing to his symptoms.  He reports having 3 bowel movements daily however he has not been taking his lactulose  for the past month.  Reports compliance with antirejection medication and rifaximin .  Reports he has not been taking his Eliquis  at home.       Med Rec Confidence   I reviewed the Medication List. There is Uncertainty about the medications the patient is currently taking. Unclear if he has been taking Eliquis  at home for hx of unprovoked DVT.     Physical Exam   Temp:  [36.2 ??C (97.2 ??F)-37.1 ??C (98.7 ??F)] 36.5 ??C (97.7 ??F)  Pulse:  [61-96] 96  SpO2 Pulse:  [59-70] 62  Resp:  [16-29] 22  BP: (121-147)/(57-87) 125/79  SpO2:  [93 %-100 %] 96 %  Body mass index is 37.31 kg/m??.  General appearance: sleepy on exam, arouses to voice and answers questions, morbidly obese   Head: Normocephalic, without obvious abnormality, atraumatic  Lungs: clear to auscultation bilaterally  Heart: regular rate and rhythm, S1, S2 normal, no murmur, click, rub or gallop  Abdomen: tender to palpation in the LUQ and periumbilical area, no rigidity, soft   Extremities: extremities normal, atraumatic, no cyanosis or edema  Skin: Skin color, texture, turgor normal. No rashes or lesions  Neurologic: Grossly normal, no asterixis on exam    Lucie EMERSON Oar, MD, PhD  Cardiovascular Disease Fellow, PGY-3  [facility]   [phone]

## 2024-06-23 NOTE — H&P (Signed)
 Assessment/Plan:    Richard [name] is a 58 y.o. male who will undergo splenic artery aneurysm embolization in Interventional Radiology.    --This procedure has been fully reviewed with the patient/patient???s authorized representative. The risks, benefits and alternatives have been explained, and the patient/patient???s authorized representative has consented to the procedure.  --The patient will accept blood products in an emergent situation.  --The patient does not have a Do Not Resuscitate order in effect.    HPI: Richard [name] is a 58 y.o. male with history of prior liver transplant, ESRD on HD, on eliquis , found to have 2.6cm hilar splenic artery aneurysm. Informed consent was obtained by Dr. Gretta from the patient in clinic and placed in the patient's chart.    Allergies: Allergies[1]    Medications: Eliquis     PSH: Past Surgical History[2]    PMH: Past Medical History[3]    PE:    Vitals:    06/23/24 0642   BP: 147/87   Pulse: 63   Resp: 18   Temp: 37.1 ??C (98.7 ??F)   SpO2: 97%     General: WD, WN male in NAD.   HEENT: Normocephalic, atraumatic.   Lungs: Respirations nonlabored      Buena ONEIDA Pouch, MD,   06/23/2024, 7:41 AM             [1] No Known Allergies  [2]   Past Surgical History:  Procedure Laterality Date    CHOLECYSTECTOMY      liver transpant      LIVER TRANSPLANTATION  06/27/2011    PR ANASTOMOSIS,AV,ANY SITE Right 02/20/2024    Procedure: ARTERIOVENOUS ANASTOMOSIS, OPEN; DIRECT, ANY SITE, UPPER EXTREMITY;  Surgeon: Marchelle Kitchens, MD;  [facility]: [facility];  Service: General Surgery    PR AV ANAST,UP ARM BASILIC VEIN TRANSPOSIT Right 03/31/2024    Procedure: ARTERIOVENOUS ANASTOMOSIS, OPEN; BY UPPER ARM BASILIC VEIN TRANSPOSITION;  Surgeon: Marchelle Kitchens, MD;  [facility];  Service: General Surgery    PR AV FIST REVISE GRFT,W THROMBECTOMY Right 01/15/2023    Procedure: REVISION, ARTERIOVENOUS FISTULA W/ THROMBECTOMY, AUTOGENOUS OR NONAUTOGENOUS DIALYSIS GRAFT (SEP. PROC), LOWER EXTREMITY;  Surgeon: Marchelle Kitchens, MD;  [facility];  Service: General Surgery    PR COLONOSCOPY FLX DX W/COLLJ SPEC WHEN PFRMD N/A 01/24/2024    Procedure: COLONOSCOPY, FLEXIBLE, PROXIMAL TO SPLENIC FLEXURE; DIAGNOSTIC, W/WO COLLECTION SPECIMEN BY BRUSH OR WASH;  Surgeon: Erick Prentice HERO, MD;  [facility];  Service: Gastroenterology    PR COLONOSCOPY W/BIOPSY SINGLE/MULTIPLE N/A 08/13/2018    Procedure: COLONOSCOPY, FLEXIBLE, PROXIMAL TO SPLENIC FLEXURE; WITH BIOPSY, SINGLE OR MULTIPLE;  Surgeon: Thedora Alm Plain, MD;  [facility]: [facility];  Service: Gastroenterology    PR COLSC FLX W/RMVL OF TUMOR POLYP LESION SNARE TQ N/A 08/13/2018    Procedure: COLONOSCOPY FLEX; W/REMOV TUMOR/LES BY SNARE;  Surgeon: Thedora Alm Plain, MD;  [facility]: [facility];  Service: Gastroenterology    PR CREAT AV FISTULA,NON-AUTOGENOUS GRAFT Right 11/29/2022    Procedure: CREATE AV FISTULA (SEPARATE PROC); NONAUTOGENOUS GRAFT (EG, BIOLOGICAL COLLAGEN, THERMOPLASTIC GRAFT), UPPER EXTREMITY;  Surgeon: Marchelle Kitchens, MD;  Location: [facility];  Service: General Surgery    PR UPPER GI ENDOSCOPY,BIOPSY N/A 07/13/2015    Procedure: UGI ENDOSCOPY; WITH BIOPSY, SINGLE OR MULTIPLE;  Surgeon: Elspeth Jerilynn Reek, MD;  [facility];  Service: Gastroenterology    PR UPPER GI ENDOSCOPY,BIOPSY N/A 02/13/2023  Procedure: UGI ENDOSCOPY; WITH BIOPSY, SINGLE OR MULTIPLE;  Surgeon: Minnie Krystal Claude, MD;  Location: [facility];  Service: Gastroenterology    PR UPPER GI ENDOSCOPY,DIAGNOSIS N/A 01/24/2024    Procedure: UGI ENDO, INCLUDE ESOPHAGUS, STOMACH, & DUODENUM &/OR JEJUNUM; DX W/WO COLLECTION SPECIMN, BY BRUSH OR WASH;  Surgeon: Erick Prentice HERO, MD;  [facility];  Service: Gastroenterology   [3]   Past Medical History:  Diagnosis Date    CHF (congestive heart failure) (CMS-HCC)     COVID-19 07/18/2019    Diabetes mellitus (CMS-HCC)     Family history of malignant neoplasm of prostate 06/23/2015    Heart disease     Heart murmur     Hepatic cirrhosis    (CMS-HCC) 06/23/2015    Overview:  Secondary to NASH; followed by Dr. Lindaann, GI.  Last Assessment & Plan:  Relevant Hx: Course: Daily Update: Today's Plan:     Hypertension     Hypogonadism in male 02/12/2014    Kidney stone     Liver cirrhosis secondary to NASH (nonalcoholic steatohepatitis) (CMS-HCC)     s/p liver transplant 2013    Obesity

## 2024-06-23 NOTE — Consults (Addendum)
 Tacrolimus  Therapeutic Monitoring Pharmacy Note    Richard [name] is a 58 y.o. male continuing tacrolimus .     Indication: Liver transplant     Date of Transplant: 02/2012      Prior Dosing Information: Home regimen tacrolimus  PO 0.5 mg BID     Source(s) of information used to determine prior to admission dosing: Patient/Caregiver or Home Medication List    Goals:  Therapeutic Drug Levels  Tacrolimus  trough goal: 2-4 ng/mL    Additional Clinical Monitoring/Outcomes  Monitor renal function (SCr and urine output) and liver function (LFTs)  Monitor for signs/symptoms of adverse events (e.g., hyperglycemia, hyperkalemia, hypomagnesemia, hypertension, headache, tremor)    Previous Lab Values  Tacrolimus , Trough   Date/Time Value Ref Range Status   05/26/2024 06:27 AM 1.7 (L) 5.0 - 15.0 ng/mL Final   04/17/2024 06:13 AM 3.2 (L) 5.0 - 15.0 ng/mL Final   04/01/2024 06:35 AM 4.5 (L) 5.0 - 15.0 ng/mL Final   02/03/2024 04:50 AM 6.2 5.0 - 15.0 ng/mL Final   02/02/2024 08:34 AM 5.2 5.0 - 15.0 ng/mL Final   10/06/2014 09:00 AM 6.8 <=20.0 ng/mL Final   08/31/2014 08:34 AM 4.2  Final   08/03/2014 08:22 AM 4.7  Final   07/07/2014 08:37 AM 4.2  Final   06/09/2014 08:27 AM 3.5  Final     Tacrolimus , Timed   Date/Time Value Ref Range Status   04/08/2024 08:28 AM 6.8 ng/mL Final       Result:  Tacrolimus  level from most recent outpatient draw was drawn appropriately     Pharmacokinetic Considerations and Significant Drug Interactions:  Concurrent CYP3A4 substrates/inhibitors: None identified    Assessment/Plan:  Recommendedation(s)  Continue current regimen of tacrolimus  PO 0.5 mg BID. Defer monitoring and adjustments to hepatology consult    Follow-up  No further levels indicated at this time.     Tennie Saddler, PharmD

## 2024-06-23 NOTE — Plan of Care (Signed)
 Pt seen per a Virtual Admissions request placed by the primary RN.  The admission was completed without family at the bedside.     A password was declined at this time and a HCDM was confirmed during the admissions questionnaire.     Pt endorsed having a previous pneumonia vaccine. Pt endorsed having a flu vaccine this season.     An education assessment and initial education were completed.   Pt advised to use the call bell if they have questions, requests or need to get out of bed.  Falls education was added to their discharge AVS.     Pt denies having home medications at bedside.     Pt's expected discharge date is currently documented as 06/19/24. Patient is opted-in on [facility] to [facility] for discharge medications ordered.        The most recent VS recorded for the Pt are noted below:  BP 144/72  - Pulse 62  - Temp 36.2 ??C (97.2 ??F) (Temporal)  - Resp 19  - Ht 177.8 cm (5' 10)  - Wt (!) 117.9 kg (260 lb)  - SpO2 93%  - BMI 37.31 kg/m??     The Pt is rating pain as 12/10 located in their ABD and groin.  They are requesting pain meds.    The primary RN was advised via chat that a 2 Person Skin Check is pending to complete the admission.

## 2024-06-24 LAB — CBC W/ AUTO DIFF
BASOPHILS ABSOLUTE COUNT: 0 10*9/L (ref 0.0–0.1)
BASOPHILS RELATIVE PERCENT: 0.1 %
EOSINOPHILS ABSOLUTE COUNT: 0 10*9/L (ref 0.0–0.5)
EOSINOPHILS RELATIVE PERCENT: 0 %
HEMATOCRIT: 32.9 % — ABNORMAL LOW (ref 39.0–48.0)
HEMOGLOBIN: 10.7 g/dL — ABNORMAL LOW (ref 12.9–16.5)
LYMPHOCYTES ABSOLUTE COUNT: 0.3 10*9/L — ABNORMAL LOW (ref 1.1–3.6)
LYMPHOCYTES RELATIVE PERCENT: 5.1 %
MEAN CORPUSCULAR HEMOGLOBIN CONC: 32.4 g/dL (ref 32.0–36.0)
MEAN CORPUSCULAR HEMOGLOBIN: 27.9 pg (ref 25.9–32.4)
MEAN CORPUSCULAR VOLUME: 86 fL (ref 77.6–95.7)
MEAN PLATELET VOLUME: 8.9 fL (ref 6.8–10.7)
MONOCYTES ABSOLUTE COUNT: 0.5 10*9/L (ref 0.3–0.8)
MONOCYTES RELATIVE PERCENT: 6.7 %
NEUTROPHILS ABSOLUTE COUNT: 6 10*9/L (ref 1.8–7.8)
NEUTROPHILS RELATIVE PERCENT: 88.1 %
PLATELET COUNT: 192 10*9/L (ref 150–450)
RED BLOOD CELL COUNT: 3.83 10*12/L — ABNORMAL LOW (ref 4.26–5.60)
RED CELL DISTRIBUTION WIDTH: 13.9 % (ref 12.2–15.2)
WBC ADJUSTED: 6.8 10*9/L (ref 3.6–11.2)

## 2024-06-24 LAB — BASIC METABOLIC PANEL
ANION GAP: 19 mmol/L — ABNORMAL HIGH (ref 5–14)
BLOOD UREA NITROGEN: 32 mg/dL — ABNORMAL HIGH (ref 9–23)
BUN / CREAT RATIO: 3
CALCIUM: 8.2 mg/dL — ABNORMAL LOW (ref 8.7–10.4)
CHLORIDE: 94 mmol/L — ABNORMAL LOW (ref 98–107)
CO2: 20 mmol/L (ref 20.0–31.0)
CREATININE: 11.93 mg/dL — ABNORMAL HIGH (ref 0.73–1.18)
EGFR CKD-EPI (2021) MALE: 5 mL/min/1.73m2 — ABNORMAL LOW (ref >=60)
GLUCOSE RANDOM: 446 mg/dL (ref 70–179)
POTASSIUM: 5 mmol/L — ABNORMAL HIGH (ref 3.4–4.8)
SODIUM: 133 mmol/L — ABNORMAL LOW (ref 135–145)

## 2024-06-24 LAB — PHOSPHORUS: PHOSPHORUS: 4 mg/dL (ref 2.4–5.1)

## 2024-06-24 LAB — TACROLIMUS LEVEL, TROUGH: TACROLIMUS, TROUGH: 3.2 ng/mL — ABNORMAL LOW (ref 5.0–15.0)

## 2024-06-24 LAB — MAGNESIUM: MAGNESIUM: 2.3 mg/dL (ref 1.6–2.6)

## 2024-06-24 LAB — HEPATITIS B SURFACE ANTIGEN: HEPATITIS B SURFACE ANTIGEN: NONREACTIVE

## 2024-06-24 MED ORDER — APIXABAN 5 MG TABLET
ORAL_TABLET | Freq: Two times a day (BID) | ORAL | 0 refills | 90.00000 days | Status: CP
Start: 2024-06-24 — End: ?
  Filled 2024-07-07: qty 180, 90d supply, fill #1

## 2024-06-24 MED ORDER — OXYCODONE 10 MG TABLET
ORAL_TABLET | ORAL | 0 refills | 4.00000 days | Status: CP | PRN
Start: 2024-06-24 — End: 2024-06-24

## 2024-06-24 MED ORDER — CALCITRIOL 0.5 MCG CAPSULE
ORAL_CAPSULE | ORAL | 0 refills | 36.00000 days | Status: CP
Start: 2024-06-24 — End: 2024-09-22

## 2024-06-24 MED ORDER — OXYCODONE 5 MG TABLET
ORAL_TABLET | ORAL | 0 refills | 2.00000 days | Status: CP | PRN
Start: 2024-06-24 — End: 2024-06-29

## 2024-06-24 MED ORDER — ASPIRIN 81 MG CHEWABLE TABLET
ORAL_TABLET | Freq: Every day | ORAL | 0 refills | 30.00000 days | Status: CP
Start: 2024-06-24 — End: 2024-07-24

## 2024-06-24 MED ADMIN — HYDROmorphone (PF) (DILAUDID) injection 1 mg: 1 mg | INTRAVENOUS | @ 14:00:00 | Stop: 2024-06-24

## 2024-06-24 MED ADMIN — HYDROmorphone (PF) (DILAUDID) injection 1 mg: 1 mg | INTRAVENOUS | @ 09:00:00 | Stop: 2024-06-24

## 2024-06-24 MED ADMIN — HYDROmorphone (PF) (DILAUDID) injection 1 mg: 1 mg | INTRAVENOUS | @ 19:00:00 | Stop: 2024-06-24

## 2024-06-24 MED ADMIN — sevelamer (RENVELA) tablet 2,400 mg: 2400 mg | ORAL | @ 23:00:00 | Stop: 2024-06-24

## 2024-06-24 MED ADMIN — sevelamer (RENVELA) tablet 2,400 mg: 2400 mg | ORAL | @ 13:00:00 | Stop: 2024-06-24

## 2024-06-24 MED ADMIN — sevelamer (RENVELA) tablet 2,400 mg: 2400 mg | ORAL | @ 02:00:00

## 2024-06-24 MED ADMIN — mycophenolate (CELLCEPT) capsule 250 mg: 250 mg | ORAL | @ 02:00:00

## 2024-06-24 MED ADMIN — mycophenolate (CELLCEPT) capsule 250 mg: 250 mg | ORAL | @ 13:00:00 | Stop: 2024-06-24

## 2024-06-24 MED ADMIN — insulin lispro (HumaLOG) injection CORRECTIONAL 0-9 Units: 0-9 [IU] | SUBCUTANEOUS | @ 23:00:00 | Stop: 2024-06-24

## 2024-06-24 MED ADMIN — insulin lispro (HumaLOG) injection CORRECTIONAL 0-9 Units: 0-9 [IU] | SUBCUTANEOUS | @ 03:00:00

## 2024-06-24 MED ADMIN — insulin lispro (HumaLOG) injection CORRECTIONAL 0-9 Units: 0-9 [IU] | SUBCUTANEOUS | @ 13:00:00 | Stop: 2024-06-24

## 2024-06-24 MED ADMIN — ursodiol (ACTIGALL) capsule 600 mg: 600 mg | ORAL | @ 02:00:00

## 2024-06-24 MED ADMIN — ursodiol (ACTIGALL) capsule 600 mg: 600 mg | ORAL | @ 13:00:00 | Stop: 2024-06-24

## 2024-06-24 MED ADMIN — oxyCODONE (ROXICODONE) immediate release tablet 10 mg: 10 mg | ORAL | @ 01:00:00 | Stop: 2024-06-30

## 2024-06-24 MED ADMIN — oxyCODONE (ROXICODONE) immediate release tablet 10 mg: 10 mg | ORAL | @ 04:00:00 | Stop: 2024-06-30

## 2024-06-24 MED ADMIN — oxyCODONE (ROXICODONE) immediate release tablet 10 mg: 10 mg | ORAL | @ 09:00:00 | Stop: 2024-06-24

## 2024-06-24 MED ADMIN — HYDROmorphone (PF) (DILAUDID) injection 1 mg: 1 mg | INTRAVENOUS | @ 02:00:00 | Stop: 2024-06-23

## 2024-06-24 MED ADMIN — calcium carbonate (TUMS) chewable tablet 200 mg elem calcium: 200 mg | ORAL | @ 19:00:00 | Stop: 2024-06-24

## 2024-06-24 MED ADMIN — gentamicin-sodium citrate lock solution in NS: 2.1 mL | @ 21:00:00 | Stop: 2024-06-24

## 2024-06-24 MED ADMIN — insulin glargine (LANTUS) injection BASAL 20 Units: 20 [IU] | SUBCUTANEOUS | @ 02:00:00

## 2024-06-24 MED ADMIN — lactulose (CEPHULAC) packet 20 g: 20 g | ORAL | @ 02:00:00

## 2024-06-24 MED ADMIN — rifAXIMin (XIFAXAN) tablet 550 mg: 550 mg | ORAL | @ 13:00:00 | Stop: 2024-06-24

## 2024-06-24 MED ADMIN — rifAXIMin (XIFAXAN) tablet 550 mg: 550 mg | ORAL | @ 02:00:00 | Stop: 2024-07-23

## 2024-06-24 MED ADMIN — HYDROmorphone (PF) (DILAUDID) injection 1 mg: 1 mg | INTRAVENOUS | @ 05:00:00 | Stop: 2024-06-23

## 2024-06-24 MED ADMIN — tacrolimus (PROGRAF) capsule 0.5 mg: .5 mg | ORAL | @ 02:00:00

## 2024-06-24 MED ADMIN — tacrolimus (PROGRAF) capsule 0.5 mg: .5 mg | ORAL | @ 13:00:00 | Stop: 2024-06-24

## 2024-06-24 MED ADMIN — calcitriol (ROCALTROL) capsule 0.5 mcg: .5 ug | ORAL | @ 19:00:00 | Stop: 2024-06-24

## 2024-06-24 MED ADMIN — insulin lispro (HumaLOG) inj PERCENTAGE MEAL EATEN 10 Units: 10 [IU] | SUBCUTANEOUS | @ 02:00:00

## 2024-06-24 MED ADMIN — insulin lispro (HumaLOG) inj PERCENTAGE MEAL EATEN 10 Units: 10 [IU] | SUBCUTANEOUS | @ 13:00:00 | Stop: 2024-06-24

## 2024-06-24 MED ADMIN — insulin lispro (HumaLOG) inj PERCENTAGE MEAL EATEN 20 Units: 20 [IU] | SUBCUTANEOUS | @ 23:00:00 | Stop: 2024-06-24

## 2024-06-24 MED ADMIN — HYDROmorphone (PF) (DILAUDID) injection 1 mg: 1 mg | INTRAVENOUS | Stop: 2024-06-23

## 2024-06-24 NOTE — Procedures (Signed)
 [facility] Nephrology Hemodialysis Procedure Note     06/24/2024    Richard [name] was seen and examined on hemodialysis    CHIEF COMPLAINT: End Stage Renal Disease    INTERVAL HISTORY:   -- Unable to fully cannulate access - running with 1 needle in access and 1 port via tunneled catheter   -- Having pain in his stomach, requests IV pain medication   -- BP currently stable 136/84    DIALYSIS TREATMENT DATA:  Estimated Dry Weight (kg): 116 kg (255 lb 11.7 oz) Patient Goal Weight (kg): 4.5 kg (9 lb 14.7 oz)   Pre-Treatment Weight (kg): 122.3 kg (269 lb 10 oz)    Dialysis Bath  Bath: 2 K+ / 2.5 Ca+  Dialysate Na (mEq/L): 137 mEq/L  Dialysate HCO3 (mEq/L): 35 mEq/L Dialyzer: F-180 (98 mLs)   Blood Flow Rate (mL/min): 375 mL/min Dialysis Flow (mL/min): 800 mL/min   Machine Temperature (C): 36.5 ??C (97.7 ??F)      PHYSICAL EXAM:  Vitals:  Temp:  [36.2 ??C (97.2 ??F)-36.8 ??C (98.2 ??F)] 36.7 ??C (98.1 ??F)  Pulse:  [61-103] 80  SpO2 Pulse:  [59-70] 62  BP: (104-174)/(57-100) 136/84  MAP (mmHg):  [81-125] 125    General: in no acute distress, currently dialyzing in a Hemodialysis Recliner  Pulmonary: normal respiratory effort  Cardiovascular: extr wwp  Extremities: trace  edema  Access: Left IJ tunneled catheter     LAB DATA:  Lab Results   Component Value Date    NA 133 (L) 06/24/2024    K 5.0 (H) 06/24/2024    CL 94 (L) 06/24/2024    CO2 20.0 06/24/2024    BUN 32 (H) 06/24/2024    CREATININE 11.93 (H) 06/24/2024    GLUCOSE 196 03/03/2012    CALCIUM  8.2 (L) 06/24/2024    MG 2.3 06/24/2024    PHOS 4.0 06/24/2024    ALBUMIN 3.3 (L) 05/28/2024      Lab Results   Component Value Date    HCT 32.9 (L) 06/24/2024    WBC 6.8 06/24/2024        ASSESSMENT/PLAN:  End Stage Renal Disease on Intermittent Hemodialysis:  UF goal: 4-4.5L as tolerated  Adjust medications for a GFR <10  Avoid nephrotoxic agents  Last HD Treatment:Started (06/24/24)     Bone Mineral Metabolism:  Lab Results   Component Value Date    CALCIUM  8.2 (L) 06/24/2024 CALCIUM  9.4 05/28/2024    Lab Results   Component Value Date    ALBUMIN 3.3 (L) 05/28/2024    ALBUMIN 3.3 (L) 05/27/2024      Lab Results   Component Value Date    PHOS 4.0 06/24/2024    PHOS 8.6 (H) 05/28/2024    Lab Results   Component Value Date    PTH 943.9 (H) 05/26/2024    PTH 267.5 (H) 10/14/2021       Sevelamer  to 2400 TIDAC, likely etiology for PTH increase.     Anemia:   Lab Results   Component Value Date    HGB 10.7 (L) 06/24/2024    HGB 10.7 (L) 05/28/2024    HGB 10.6 (L) 05/27/2024    Iron Saturation (%)   Date Value Ref Range Status   11/09/2022 36 % Final   05/15/2012 39 20 - 50 % Final      Lab Results   Component Value Date    FERRITIN 169.8 11/09/2022       Anemia labs appropriate, no changes.  Vascular Access:  Vascular Access functioning well - no need for intervention  Blood Flow Rate (mL/min): 375 mL/min    IV Antibiotics to be administered at discharge:  TBD    This procedure was fully reviewed with the patient and/or their decision-maker. The risks, benefits, and alternatives were discussed prior to the procedure. All questions were answered and written informed consent was obtained.    Odella LITTIE Hock, MD  [place] Division of Nephrology & Hypertension

## 2024-06-24 NOTE — Hospital Course (Addendum)
 Richard [name] is a 58 year old male with a history of liver transplant for NASH cirrhosis, end-stage renal disease (ESRD) on hemodialysis, heart failure with preserved ejection fraction, coronary artery disease, hypertension, type 2 diabetes mellitus, and prior deep venous thrombosis, who was admitted for management of a splenic artery aneurysm.    Splenic Artery Aneurysm (SAA)  Richard [name] was admitted for scheduled management of a 2.6 cm hilar splenic artery aneurysm iso of prior liver transplant and portal HTN. He underwent successful stenting of the aneurysm via IR on 06/23/2024 without intra-procedural complications and with preservation of splenic branches. Post-procedure, he experienced significant abdominal pain, prompting overnight admission for observation and pain control. He was managed with IV and oral analgesics, and his pain improved by 06/24/2024. He was cleared for discharge from the interventional radiology perspective, with recommendations to resume apixaban  and initiate low-dose aspirin  for stent patency, and to follow up with a CTA.    Abdominal Pain / Umbilical Hernia  He reported ongoing periumbilical abdominal pain prior to admission, which was exacerbated post-procedure. Imaging from 03/2024 demonstrated a small fat-containing umbilical hernia with adjacent fat stranding, likely contributing to his symptoms. The pain was noted to be worse after iHD. Surgical referral for outpatient evaluation of the hernia was considered. On exam, his abdomen was tender in the LUQ and periumbilical region without rigidity or peritonitis, and improved by discharge.    ESRD on Hemodialysis  He has ESRD secondary to calcineurin inhibitor toxicity and hypertension, managed with in-center hemodialysis via left upper extremity AVF and left IJ tunneled catheter. Continued dialysis inpatient to remain on TuThSat schedule. Nephrology was consulted and recommended continuation of his outpatient dialysis regimen.    Immunosuppression / Liver Transplant / Cirrhosis  He is s/p orthotopic liver transplant (OLT) in 2013 for NASH cirrhosis, with ongoing immunosuppression. He is followed by hepatology and has a history of recurrent hepatic encephalopathy, most recently admitted in early 05/2024. He has not taken lactulose  for over a month but continues rifaximin . His tacrolimus  trough goal is 2-4 ng/mL, and levels were monitored during admission, slightly low, discussed with Hepatology outpatient on obtaining labs while outpatient. No evidence of acute rejection or biliary complications noted.    Anticoagulation / DVT / Paroxysmal Atrial Fibrillation  He has a history of unprovoked DVT and paroxysmal atrial fibrillation. Apixaban  was held peri-procedurally and resumed post-procedure per interventional radiology recommendations. Regarding his home anticoagulation adherence, he denied taking apixaban  prior to admission - states he just stopped taking it - discussed risk and benefit of stopping medication given hx of clots. Amenable to resume, started back on BID eliquis  06/24/2024.  He was also started on low-dose aspirin  for stent patency.    Type 2 Diabetes Mellitus with Hyperglycemia  Home regimen to be continued on discharge. Severe post-prandial hyperglycemia iso of peri-procedure dexamethasone . Likely to see stabilization of glucoses in the next 24 hours given long-acting nature of dexamethasone .    Hypertension / Hypertensive Heart and Chronic Kidney Disease with Heart Failure  He has a history of HTN, HFpEF, CKD. No acute concerns noted. Follow-up with outpatient providers per routine follow-up.

## 2024-06-24 NOTE — Plan of Care (Signed)
 Shift Summary  Multiple pain medications (HYDROmorphone  and oxyCODONE ) were administered throughout the shift, with pain scores briefly decreasing but returning to high levels.   Insulin  lispro and glargine were administered in response to elevated blood glucose, and provider was notified as instructed.   Fall prevention strategies were maintained, including environmental modifications, scheduled toileting, and hourly visual checks.   MAP and SpO2 values remained within acceptable ranges, and no oxygen supplementation was required.   Overall, patient required assistance with some ADLs, experienced persistent pain and anxiety, and safety interventions were consistently implemented.     Unobstructed Breathing During Sleep: Respiratory rate decreased from 22 to 16 overnight, and SpO2 remained stable at 96-98% on room air, with no oxygen device required throughout the shift.     Absence of Fall and Fall-Related Injury: Fall prevention interventions such as low bed, environmental modifications, and scheduled toileting were consistently maintained, and hourly visual checks confirmed patient remained in bed and awake or resting.     Improved Ability to Complete Activities of Daily Living: Assistance was required for dressing and bathing due to recent changes, while independence was maintained in feeding, toileting, transferring, and mobility.     Anxiety Reduction or Resolution: Anxiety was noted during the shift, but patient was able to express feelings and thoughts and participated in rest/sleep interventions.     Effective Tissue Perfusion: MAP values fluctuated but remained within a range of 81 to 116 mmHg, and moderate right dorsalis pedis pulse was documented.

## 2024-06-24 NOTE — Plan of Care (Signed)
 4 hr HD treatment, 4500 ml fluid removal as tolerated, keep SBP > 100.  Right arm AVF +bruit/+thrill. Arterial cannulated without difficulty, not able to cannulate venous after 3 attempts between 2 RN's  Venous lumen of HD catheter used for treatment.

## 2024-06-24 NOTE — Consults (Signed)
 Nephrology ESKD Consult Note    Requesting provider: Lucie CHRISTELLA Oar, MD  Service requesting consult: [facility])  Reason for consult: Evaluation for dialysis needs    Outpatient dialysis unit:   [facility]  [address]  [place] [address]     Assessment/Recommendations: Richard [name] is a 58 y.o. male with a past medical history notable for ESKD on in-center hemodialysis being evaluated at [facility] for splenic artery aneurysm stent placement     # ESKD:   - Indication for acute dialysis?: No  - We will perform HD starting today and will otherwise attempt to continue pt's outpatient schedule if possible.   - Access: LUE AV fistula and Left IJ tunneled catheter ; Will use L AVF during HD. Vascular access functioning well- no indication for intervention.  - Hepatitis status: Hepatitis B surface antigen negative on 05/26/24.    # Volume / Hypertension:  - Volume: Will attempt to achieve dry weight if tolerated  - Will adjust daily as needed    # Acid-Base / Electrolytes:  - Will evaluate acid-base status and electrolyte balance daily and adjust dialysate as needed.  Lab Results   Component Value Date    K 4.1 06/23/2024    CO2 24.0 05/28/2024       # Anemia of Chronic Kidney Disease:  - ESA: Patient not on ESA , Last dose -  - IV Iron: nor on IV iron per last HD notes   Lab Results   Component Value Date    HGB 10.7 (L) 05/28/2024       # Secondary Hyperparathyroidism/Hyperphosphatemia:  - Phosphate Binders: sevelemer 800 mg TID   - Activated Vit D: 0.5 mcg with each treatment   - Calcimimetics: no  Lab Results   Component Value Date    PHOS 8.6 (H) 05/28/2024    CALCIUM  9.4 05/28/2024       Rayfield DELENA Christ, DO  06/24/2024 6:01 AM   Medical decision-making for 06/24/2024  Findings / Data     Patient has: []  acute illness w/systemic sxs  [mod]  []  two or more stable chronic illnesses [mod]  []  one chronic illness with acute exacerbation [mod]  []  acute complicated illness [mod]  []  Undiagnosed new problem with uncertain prognosis  [mod] [x]  illness posing risk to life or bodily function (ex. AKI)  [high]  []  chronic illness with severe exacerbation/progression  [high]  []  chronic illness with severe side effects of treatment  [high] ESKD on RRT Probs At least 2:  Probs, Data, Risk   Mgm't requires: []  Prescription drug(s)  [mod]  []  Kidney biopsy  [mod]  []  Central line placement  [mod] []  High risk medication use and/or intensive toxicity monitoring [high]  [x]  Renal replacement therapy [high]  []  High risk kidney biopsy  [high]  []  Escalation of care  [high]  []  High risk central line placement  [high] RRT: High risk of complications from RRT requiring intensive monitoring Risk        _____________________________________________________________________________________    History of Present Illness: Richard BOCOCK is a 58 y.o. male with ESKD on dialysis as well as cirrhosis s/p transplant, ESRD secondary to CNI toxicity,, HFpEF, CAD, HTN, HLD, type 2 diabetes being evaluated at [facility] for splenic artery aneurysm embolization, however unable to embolized but stent was placed  who is seen in consultation at the request of Lucie CHRISTELLA Oar, MD and [facility]). Nephrology has been consulted  for evaluation of dialysis needs in the setting of end-stage kidney disease.    He is concerned that he has uremia though BUN is 32, he says he has been coming off early from treatments due to abdominal pain that has been chronic.   He recently got his AVF revised and cannulated two needles successfully last treatment. He has not missed any recent sessions of HD .   Physical Exam:  Vitals:    06/24/24 0504   BP: (!) 156/99   Pulse: 93   Resp:    Temp:    SpO2:      I/O this shift:  In: 310 [P.O.:300; I.V.:10]  Out: -     Intake/Output Summary (Last 24 hours) at 06/24/2024 0601  Last data filed at 06/23/2024 2000  Gross per 24 hour   Intake 1010 ml   Output --   Net 1010 ml       General: well-appearing, no acute distress  Heart: RRR, no m/r/g  Lungs: CTAB, normal wob  Abd: soft, non-tender, non-distended  Ext: no edema  Access: LUE AV fistula good thrill and bruit, R TDC

## 2024-06-24 NOTE — Consults (Signed)
 [facility]   Care Management     Patient is a 58 y.o. admitted on 06/23/2024 for Splenic artery aneurysm [I72.8]. Per review of the medical record and discussion with the treating team, the patient does not meet indicators for a full assessment at this time. CM will continue to assess for discharge needs and follow up, as indicated.    [name], RN June 24, 2024 9:08 AM      Food Insecurity: No Food Insecurity (01/18/2024)    Hunger Vital Sign     Worried About Running Out of Food in the Last Year: Never true     Ran Out of Food in the Last Year: Never true      Financial Resource Strain: Low Risk (01/18/2024)    Overall Financial Resource Strain (CARDIA)     Difficulty of Paying Living Expenses: Not hard at all     Housing: Low Risk (01/18/2024)     Transportation Needs: No Transportation Needs (01/18/2024)    PRAPARE - Therapist, Art (Medical): No     Lack of Transportation (Non-Medical): No

## 2024-06-24 NOTE — Procedures (Signed)
 HEMODIALYSIS NURSE PROCEDURE NOTE       Treatment Number:  1 Room / Station:  8    Procedure Date:  06/24/2024 Device Name/Number: Miquel    Total Dialysis Treatment Time:  257 Min.    CONSENT:    Written consent was obtained prior to the procedure and is detailed in the medical record.  Prior to the start of the procedure, a time out was taken and the identity of the patient was confirmed via name, medical record number and date of birth.     WEIGHT:   Date/Time Pre-Treatment Weight (kg) Estimated Dry Weight (kg) Patient Goal Weight (kg) Total Goal Weight (kg)    06/24/24 1130 122.3 kg (269 lb 10 oz)  116 kg (255 lb 11.7 oz)  4.5 kg (9 lb 14.7 oz)  5.05 kg (11 lb 2.1 oz)        Date/Time Post-Treatment Weight (kg) Treatment Weight Change (kg)    06/24/24 1711 118.6 kg (261 lb 7.5 oz)  -3.71 kg      Active Dialysis Orders (168h ago, onward)       Start     Ordered    06/24/24 0845  Hemodialysis inpatient  Every Tue,Thu,Sat      Question Answer Comment   Patient HD Status: Chronic    New Start? No    K+ 2 meq/L    Ca++ 2.5 meq/L    Bicarb 35 meq/L    Na+ 137 meq/L    Na+ Modeling no    Dialyzer [id number]    Dialysate Temperature (C) 36.5    BFR-As tolerated to a maximum of: 500 mL/min    DFR 800 mL/min    Duration of treatment 4 Hr    Dry weight (kg) 116    Challenge dry weight (kg) no    Fluid removal (L) to edw as tolerate by BP    Tubing Adult = 142 ml    Access Site AVF    Access Site Location Right    Keep SBP >: 100        06/24/24 0844                  ASSESSMENT:  General appearance: alert  Neurologic: Mental status: Alert, oriented, thought content appropriate  Lungs: clear to auscultation bilaterally  Heart: S1, S2 normal  Abdomen: normal findings: soft, non-tender      ACCESS SITE:       Hemodialysis Catheter 05/20/24 Left Internal jugular 2.1 mL 2.1 mL (Active)   Site Assessment Clean;Dry;Intact 06/24/24 1714   Proximal Lumen Status / Patency Capped;Gentamicin  Citrate Locked 06/24/24 1714   Proximal Lumen Intervention Deaccessed 06/24/24 1714   Medial Lumen Status / Patency Capped;Gentamicin  Citrate Locked 06/24/24 1714   Medial Lumen Intervention Deaccessed 06/24/24 1714   Dressing Intervention Dressing changed 06/24/24 1714   Dressing Status      Changed 06/24/24 1714   Verification by X-ray Yes 05/20/24 1644   Site Condition No complications 06/24/24 1714   Dressing Type CHG gel;Occlusive;Transparent 06/24/24 1714   Dressing Change Due 07/01/24 06/24/24 1714   Line Necessity Reviewed? Y 06/24/24 1714   Line Necessity Indications Yes - Hemodialysis 06/24/24 1714   Line Necessity Reviewed With nephrology 06/24/24 1220        Arteriovenous Fistula - Vein Graft  Access Arteriovenous fistula Right;Upper Arm (Active)   Site Assessment Clean;Dry;Intact 06/24/24 1714   AV Fistula Thrill Present;Bruit Present 06/24/24 1714   Status Deaccessed 06/24/24 1714  Dressing Intervention New dressing 06/24/24 1714   Dressing Status      Clean;Dry;Intact/not removed 06/24/24 1714   Site Condition No complications 06/24/24 1714   Dressing Gauze 06/24/24 1714   Dressing To Be Removed (Date/Time) 12/30 after 2300 06/24/24 1714     Right arm AVF 15g needle for arterial access, venous access via left chest HD catheter.      Patient Lines/Drains/Airways Status       Active Peripheral & Central Intravenous Access       Name Placement date Placement time Site Days    Peripheral IV 05/25/24 Anterior;Left;Proximal Forearm 05/25/24  1831  Forearm  29    Hemodialysis Catheter 05/20/24 Left Internal jugular 2.1 mL 2.1 mL 05/20/24  1611  Internal jugular  35                   LAB RESULTS:  Lab Results   Component Value Date    NA 133 (L) 06/24/2024    K 5.0 (H) 06/24/2024    CL 94 (L) 06/24/2024    CO2 20.0 06/24/2024    BUN 32 (H) 06/24/2024    CREATININE 11.93 (H) 06/24/2024    GLU 446 (HH) 06/24/2024    GLUF 138 10/06/2014    CALCIUM  8.2 (L) 06/24/2024    CAION 3.67 (L) 02/02/2024    ICAV 4.88 03/04/2012    PHOS 4.0 06/24/2024    MG 2.3 06/24/2024    PTH 943.9 (H) 05/26/2024    IRON 95 11/09/2022    LABIRON 36 11/09/2022    TRANSFERRIN 212.0 (L) 11/09/2022    FERRITIN 169.8 11/09/2022    TIBC 267.1 11/09/2022     Lab Results   Component Value Date    WBC 6.8 06/24/2024    HGB 10.7 (L) 06/24/2024    HCT 32.9 (L) 06/24/2024    PLT 192 06/24/2024    PHART 7.36 03/04/2012    PO2ART 88 03/04/2012    PCO2ART 43 03/04/2012    HCO3ART 23.4 03/04/2012    BEART -1.3 03/04/2012    O2SATART 98.1 03/04/2012    PTINR : 05/06/2012    APTT 290.0 (HH) 01/19/2024        VITAL SIGNS:    Date/Time Temp Temp src       06/24/24 1640 36.5 ??C (97.7 ??F)  Oral        Date/Time Pulse BP MAP (mmHg) Patient Position    06/24/24 1640 68  143/87  --  Sitting     06/24/24 1636 57  126/71  --  Sitting     06/24/24 1630 72  123/87  --  Sitting     06/24/24 1600 69  135/83  --  Sitting     06/24/24 1530 75  145/78  --  Sitting     06/24/24 1500 65  167/73  --  Sitting     06/24/24 1430 81  120/74  --  Sitting     06/24/24 1400 80  136/84  --  Sitting     06/24/24 1345 65  104/88  --  Sitting     06/24/24 1330 82  136/87  --  Sitting     06/24/24 1300 82  150/92  --  Sitting     06/24/24 1245 80  161/93  --  Sitting     06/24/24 1230 77  174/97  --  Sitting     06/24/24 1219 75  170/95  --  Sitting  Date/Time Blood Volume Change (%) HCT HGB Critline O2 SAT %    06/24/24 1630 -15.6 %  36  12.2  54.6     06/24/24 1600 -13.2 %  35  11.9  61.3     06/24/24 1530 -14.3 %  35.4  12  54.7     06/24/24 1500 -16.6 %  36.4  12.4  95.8     06/24/24 1430 -15.3 %  35.9  12.2  96.1     06/24/24 1400 -15.8 %  36.1  12.3  96.4     06/24/24 1345 -15.6 %  36  12.2  96.9     06/24/24 1330 -14.1 %  35.4  12  97       Date/Time Resp SpO2 O2 Device O2 Flow Rate (L/min)    06/24/24 1640 18  --  None (Room air)  --     06/24/24 1636 18  --  None (Room air)  --     06/24/24 1630 18  --  None (Room air)  --     06/24/24 1600 18  --  None (Room air)  --     06/24/24 1530 18  --  None (Room air)  -- 06/24/24 1500 18  --  None (Room air)  --     06/24/24 1430 18  --  None (Room air)  --     06/24/24 1400 18  --  None (Room air)  --     06/24/24 1345 18  --  None (Room air)  --     06/24/24 1330 18  --  None (Room air)  --     06/24/24 1300 18  --  None (Room air)  --     06/24/24 1245 18  --  None (Room air)  --     06/24/24 1230 18  --  --  --     06/24/24 1219 18  --  None (Room air)  --         Date/Time Therapy Number Dialyzer Hemodialysis Line Type All Machine Alarms Passed    06/24/24 1130 1  F-180 (98 mLs)  Adult (142 m/s)  Yes       Date/Time Air Detector Saline Line Double Clampled Hemo-Safe Applied Dialysis Flow (mL/min)    06/24/24 1130 Engaged  Yes  Yes  800 mL/min       Date/Time Verify Priming Solution Priming Volume Hemodialysis Independent pH Hemodialysis Machine Conductivity (mS/cm)    06/24/24 1130 0.9% NS  300 mL  --  --       Date/Time Hemodialysis Independent Conductivity (mS/cm) Bicarb Conductivity Residual Bleach Negative Total Chlorine    06/24/24 1130 15.1 mS/cm  -- Yes  0       Date/Time Pre-Hemodialysis Comments    06/24/24 1130 alert, NAD       Date/Time Blood Flow Rate (mL/min) Arterial Pressure (mmHg) Venous Pressure (mmHg) Transmembrane Pressure (mmHg)    06/24/24 1636 --  --  --  --     06/24/24 1630 125 mL/min  -202 mmHg  56 mmHg  86 mmHg     06/24/24 1600 200 mL/min  -218 mmHg  69 mmHg  101 mmHg     06/24/24 1530 300 mL/min  -238 mmHg  124 mmHg  87 mmHg     06/24/24 1500 250 mL/min  -250 mmHg  111 mmHg  96 mmHg     06/24/24 1430 375 mL/min  -244 mmHg  172 mmHg  78 mmHg     06/24/24 1400 375 mL/min  -202 mmHg  168 mmHg  76 mmHg     06/24/24 1345 400 mL/min  -226 mmHg  181 mmHg  79 mmHg     06/24/24 1330 400 mL/min  -219 mmHg  178 mmHg  81 mmHg     06/24/24 1300 400 mL/min  -194 mmHg  180 mmHg  80 mmHg     06/24/24 1245 400 mL/min  -195 mmHg  177 mmHg  78 mmHg     06/24/24 1230 400 mL/min  -200 mmHg  166 mmHg  78 mmHg     06/24/24 1219 200 mL/min  -70 mmHg  90 mmHg  40 mmHg Date/Time Ultrafiltration Rate (mL/hr) Ultrafiltrate Removed (mL) Dialysate Flow Rate (mL/min) KECN Marna)    06/24/24 1636 --  4850 mL  --  --     06/24/24 1630 1310 mL/hr  4712 mL  0 ml/min  --     06/24/24 1600 1300 mL/hr  4094 mL  800 ml/min  --     06/24/24 1530 1170 mL/hr  3575 mL  800 ml/min  --     06/24/24 1500 1170 mL/hr  3039 mL  800 ml/min  --     06/24/24 1430 1170 mL/hr  2524 mL  800 ml/min  --     06/24/24 1400 1080 mL/hr  1983 mL  800 ml/min  --     06/24/24 1345 1280 mL/hr  1716 mL  800 ml/min  --     06/24/24 1330 1280 mL/hr  1398 mL  800 ml/min  --     06/24/24 1300 1280 mL/hr  785 mL  800 ml/min  --     06/24/24 1245 1140 mL/hr  488 mL  800 ml/min  --     06/24/24 1230 1140 mL/hr  213 mL  800 ml/min  --     06/24/24 1219 1140 mL/hr  0 mL  800 ml/min  --       Date/Time Intra-Hemodialysis Comments    06/24/24 1636 HD treatment ended     06/24/24 1630 arterial access becoming sluggish     06/24/24 1600 alert, stable     06/24/24 1530 alert     06/24/24 1500 arterial needle deaccessed and switched to catheter.  Pt frequently moving and needle moved.     06/24/24 1430 alert     06/24/24 1400 UF goal increased to 4750, critline profile A, Dr. Germaine at bedside     06/24/24 1345 UF goal decreased to 4550 d/t decreased BP     06/24/24 1330 c/o heartburn, medication given     06/24/24 1300 on the phone     06/24/24 1245 UF goal increased to 5050, critline profile A     06/24/24 1230 watching TV     06/24/24 1219 HD treatment initiated       Date/Time Rinseback Volume (mL) On Line Clearance: spKt/V Total Liters Processed (L/min) Dialyzer Clearance    06/24/24 1711 300 mL  0.74 spKt/V  70.5 L/min  Moderately streaked       Date/Time Post-Hemodialysis Comments    06/24/24 1711 alert,VSS       Date/Time Total Hemodialysis Replacement Volume (mL) Total Ultrafiltrate Output (mL)    06/24/24 1711 --  4300 mL        1205-1205-01 - Medicaitons Given During Treatment  (last 5 hrs)           Aziah Brostrom, RN Medication Name Action Time  Action Route Rate Dose User     HYDROmorphone  (PF) (DILAUDID ) injection 1 mg 06/24/24 1411 Given Intravenous  1 mg Alessia Gonsalez, RN     calcitriol  (ROCALTROL ) capsule 0.5 mcg 06/24/24 1334 Given Oral  0.5 mcg Nannette Zill, RN     calcium  carbonate (TUMS) chewable tablet 200 mg elem calcium  06/24/24 1332 Given Oral  200 mg elem calcium  Khing Belcher, RN     gentamicin -sodium citrate  lock solution in NS 06/24/24 1625 Given Intra-cannular  2.1 mL Davisha Linthicum, RN     gentamicin -sodium citrate  lock solution in NS 06/24/24 1625 Given Intra-cannular  2.1 mL Elsworth Ledin, RN                      Patient tolerated treatment in a  Dialysis Recliner.

## 2024-06-24 NOTE — Discharge Summary (Signed)
 Physician Discharge Summary [facility]  1 [facility] OBSERVATION [facility]  [address]  [place] [address]  Dept: [phone]  Loc: [phone]     Identifying Information:   Richard [name]  [birth year]  [id number]    Primary Care Physician: Jama Mayo, MD   Code Status: Full Code    Admit Date: 06/23/2024    Discharge Date: 06/24/2024     Discharge To: Home    Discharge Service: [facility] - Hospitalist [place] APP     Discharge Attending Physician: Jenne Jenkins Kennel, FNP    Discharge Diagnoses:  Principal Problem:    Splenic artery aneurysm (POA: Yes)  Active Problems:    Hypertension (POA: Yes)    Type 2 diabetes mellitus with hyperglycemia, with long-term current use of insulin  (CMS-HCC) (POA: Not Applicable)    Liver replaced by transplant (CMS-HCC) (POA: Not Applicable)    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) (POA: Not Applicable)    ESRD (end stage renal disease) on dialysis    (CMS-HCC) (POA: Not Applicable)    Cirrhosis of transplanted liver    (CMS-HCC) (POA: Yes)    Hypertensive heart and chronic kidney disease with heart failure and with stage 5 chronic kidney disease, or end stage renal disease (CMS-HCC) (POA: Yes)    Diabetes mellitus (CMS-HCC) (POA: Yes)    DVT (deep venous thrombosis) (CMS-HCC) (POA: Yes)    Paroxysmal atrial fibrillation    (CMS-HCC) (POA: Yes)    Periumbilical abdominal pain (POA: Yes)  Resolved Problems:    * No resolved hospital problems. *      Outpatient Provider Follow Up Issues:   [ ]  Encourage and reinforce continuation of Eliquis  given hx of unprovoked DVTs + paroxysmal afib  [ ]  Continue ASA 81 mg for 3 months for stent patency per IR  [ ]  CTA abdomen follow-up scheduled - 08/26/2024  [ ]  Podiatry appt 08/14/2024  [ ]  Hepatology follow-up appt. 08/19/2024  [ ]  Hepatology will also follow-up re: blood draw to re-check tac levels prior to appt in February  [ ]  Recommend further discussion with nephrology regarding abdominal pain potentially associated with dialysis  [ ]  Follow-up re: hernia in outpatient setting - placed referral    Hospital Course:   Richard [name] is a 58 year old male with a history of liver transplant for NASH cirrhosis, end-stage renal disease (ESRD) on hemodialysis, heart failure with preserved ejection fraction, coronary artery disease, hypertension, type 2 diabetes mellitus, and prior deep venous thrombosis, who was admitted for management of a splenic artery aneurysm.    Splenic Artery Aneurysm (SAA)  Richard [name] was admitted for scheduled management of a 2.6 cm hilar splenic artery aneurysm iso of prior liver transplant and portal HTN. He underwent successful stenting of the aneurysm via IR on 06/23/2024 without intra-procedural complications and with preservation of splenic branches. Post-procedure, he experienced significant abdominal pain, prompting overnight admission for observation and pain control. He was managed with IV and oral analgesics, and his pain improved by 06/24/2024. He was cleared for discharge from the interventional radiology perspective, with recommendations to resume apixaban  and initiate low-dose aspirin  for stent patency, and to follow up with a CTA.    Abdominal Pain / Umbilical Hernia  He reported ongoing periumbilical abdominal pain prior to admission, which was exacerbated post-procedure. Imaging from 03/2024 demonstrated a small fat-containing umbilical hernia with adjacent fat stranding, likely contributing to his symptoms. The pain was noted to be worse after iHD. Surgical referral for outpatient evaluation of  the hernia was considered. On exam, his abdomen was tender in the LUQ and periumbilical region without rigidity or peritonitis, and improved by discharge.    ESRD on Hemodialysis  He has ESRD secondary to calcineurin inhibitor toxicity and hypertension, managed with in-center hemodialysis via left upper extremity AVF and left IJ tunneled catheter. Continued dialysis inpatient to remain on TuThSat schedule. Nephrology was consulted and recommended continuation of his outpatient dialysis regimen.    Immunosuppression / Liver Transplant / Cirrhosis  He is s/p orthotopic liver transplant (OLT) in 2013 for NASH cirrhosis, with ongoing immunosuppression. He is followed by hepatology and has a history of recurrent hepatic encephalopathy, most recently admitted in early 05/2024. He has not taken lactulose  for over a month but continues rifaximin . His tacrolimus  trough goal is 2-4 ng/mL, and levels were monitored during admission, slightly low, discussed with Hepatology outpatient on obtaining labs while outpatient. No evidence of acute rejection or biliary complications noted.    Anticoagulation / DVT / Paroxysmal Atrial Fibrillation  He has a history of unprovoked DVT and paroxysmal atrial fibrillation. Apixaban  was held peri-procedurally and resumed post-procedure per interventional radiology recommendations. Regarding his home anticoagulation adherence, he denied taking apixaban  prior to admission - states he just stopped taking it - discussed risk and benefit of stopping medication given hx of clots. Amenable to resume, started back on BID eliquis  06/24/2024.  He was also started on low-dose aspirin  for stent patency.    Type 2 Diabetes Mellitus with Hyperglycemia  Home regimen to be continued on discharge. Severe post-prandial hyperglycemia iso of peri-procedure dexamethasone . Likely to see stabilization of glucoses in the next 24 hours given long-acting nature of dexamethasone .    Hypertension / Hypertensive Heart and Chronic Kidney Disease with Heart Failure  He has a history of HTN, HFpEF, CKD. No acute concerns noted. Follow-up with outpatient providers per routine follow-up.    Procedures:  dialysis  IR EMBOLIZATION ARTERIAL OTHER THAN HEMORRHAGE [[id number]]  ______________________________________________________________________  Discharge Medications:     Your Medication List        START taking these medications      aspirin  81 MG chewable tablet  Chew 1 tablet (81 mg total) daily. Stop taking after 30 days (when you finish this bottle).     calcitriol  0.5 MCG capsule  Commonly known as: ROCALTROL   Take 1 capsule (0.5 mcg total) by mouth 3 (three) times a week in dialysis (for use in dialysis).     oxyCODONE  5 MG immediate release tablet  Commonly known as: ROXICODONE   Take 1 tablet (5 mg total) by mouth every four (4) hours as needed for up to 5 days.            CHANGE how you take these medications      ELIQUIS  5 mg Tab  Generic drug: apixaban   Take 1 tablet (5 mg total) by mouth two (2) times a day.  What changed: Another medication with the same name was added. Make sure you understand how and when to take each.     apixaban  5 mg Tab  Commonly known as: ELIQUIS   Take 1 tablet (5 mg total) by mouth two (2) times a day.  What changed: You were already taking a medication with the same name, and this prescription was added. Make sure you understand how and when to take each.            CONTINUE taking these medications      blood-glucose meter kit  Use as  directed to check blood sugars     CONTOUR NEXT EZ METER Misc  Generic drug: blood-glucose meter  Use as instructed     CONTOUR NEXT TEST STRIPS Strp  Generic drug: blood sugar diagnostic  Use to check blood sugar as directed with insulin  3 times a day and for symptoms of high or low blood sugar.     DEXCOM G6 RECEIVER Misc  Generic drug: blood-glucose,receiver,cont  1 each by Miscellaneous route in the morning. Dispense 1 receiver annually.  Sent to [facility].     DEXCOM G7 SENSOR Devi  Generic drug: blood-glucose sensor  Use to continuously monitor blood glucose.  Change sensor every 10 days.     gabapentin  300 MG capsule  Commonly known as: NEURONTIN   Take 1 capsule (300 mg total) by mouth nightly.     insulin  aspart 100 unit/mL (3 mL) injection pen  Commonly known as: NovoLOG  Flexpen U-100 Insulin   Inject 0.3 mL (30 Units total) under the skin Three (3) times a day before meals.     insulin  degludec 200 unit/mL (3 mL) Inpn  Commonly known as: TRESIBA  FLEXTOUCH U-200  Inject 0.35 mL (70 Units total) under the skin daily. Max dose of 100 units per day.     If fasting glucose is greater than 180, increase basal insulin  by 5 units. Recheck fasting glucose the following morning and continue to increase by 5 units daily until fasting sugar less than 180.     lactulose  20 gram packet  Commonly known as: CEPHULAC   Take 1 packet (20 g total) by mouth Three (3) times a day. Titrate to 3-5 bowel movements per day.     MICROLET LANCET Misc  Generic drug: lancets  Use to check blood sugar as directed with insulin  3 times a day and for symptoms of high or low blood sugar.     MOUNJARO  15 mg/0.5 mL Pnij  Generic drug: tirzepatide   Inject 15 mg under the skin every seven (7) days.     mycophenolate  250 mg capsule  Commonly known as: CELLCEPT   Take 1 capsule (250 mg total) by mouth two (2) times a day.     PROGRAF  0.5 mg capsule  Generic drug: tacrolimus   Take 1 capsule (0.5 mg total) by mouth two (2) times a day.     rifAXIMin  550 mg Tab  Commonly known as: XIFAXAN   Take 1 tablet (550 mg total) by mouth two (2) times a day.     sevelamer  800 mg tablet  Commonly known as: RENVELA   Take 3 tablets (2,400 mg total) by mouth Three (3) times a day with a meal.     TRUEPLUS PEN NEEDLE 32 gauge x 5/32 (4 mm) Ndle  Generic drug: pen needle, diabetic  Use with insulin  up to 4 times a day as needed.     pen needle, diabetic 32 gauge x 5/32 (4 mm) Ndle  Use with insulin  up to 4 times daily as needed.     ursodiol  300 mg capsule  Commonly known as: ACTIGALL   Take 2 capsules (600 mg total) by mouth two (2) times a day.     VITAMIN D3 ORAL  Take 2,000 Units by mouth in the morning.            ASK your doctor about these medications      pantoprazole  40 MG tablet  Commonly known as: Protonix   Take 1 tablet (40 mg total) by mouth daily as needed.  Allergies:  Patient has no known allergies.  ______________________________________________________________________  Pending Test Results (if blank, then none):  Pending Labs       Order Current Status    Hepatitis B Surface Antigen In process            Most Recent Labs:  All lab results last 24 hours -   Recent Results (from the past 24 hours)   POCT Glucose    Collection Time: 06/23/24  9:10 PM   Result Value Ref Range    Glucose, POC 316 (H) 70 - 179 mg/dL   POCT Glucose    Collection Time: 06/24/24  7:03 AM   Result Value Ref Range    Glucose, POC 395 (H) 70 - 179 mg/dL   Tacrolimus  Level, Trough    Collection Time: 06/24/24  7:10 AM   Result Value Ref Range    Tacrolimus , Trough 3.2 (L) 5.0 - 15.0 ng/mL   Basic Metabolic Panel    Collection Time: 06/24/24  7:10 AM   Result Value Ref Range    Sodium 133 (L) 135 - 145 mmol/L    Potassium 5.0 (H) 3.4 - 4.8 mmol/L    Chloride 94 (L) 98 - 107 mmol/L    CO2 20.0 20.0 - 31.0 mmol/L    Anion Gap 19 (H) 5 - 14 mmol/L    BUN 32 (H) 9 - 23 mg/dL    Creatinine 88.06 (H) 0.73 - 1.18 mg/dL    BUN/Creatinine Ratio 3     eGFR CKD-EPI (2021) Male 5 (L) >=60 mL/min/1.66m2    Glucose 446 (HH) 70 - 179 mg/dL    Calcium  8.2 (L) 8.7 - 10.4 mg/dL   Magnesium  Level    Collection Time: 06/24/24  7:10 AM   Result Value Ref Range    Magnesium  2.3 1.6 - 2.6 mg/dL   Phosphorus Level    Collection Time: 06/24/24  7:10 AM   Result Value Ref Range    Phosphorus 4.0 2.4 - 5.1 mg/dL   CBC w/ Differential    Collection Time: 06/24/24  7:10 AM   Result Value Ref Range    WBC 6.8 3.6 - 11.2 10*9/L    RBC 3.83 (L) 4.26 - 5.60 10*12/L    HGB 10.7 (L) 12.9 - 16.5 g/dL    HCT 67.0 (L) 60.9 - 48.0 %    MCV 86.0 77.6 - 95.7 fL    MCH 27.9 25.9 - 32.4 pg    MCHC 32.4 32.0 - 36.0 g/dL    RDW 86.0 87.7 - 84.7 %    MPV 8.9 6.8 - 10.7 fL    Platelet 192 150 - 450 10*9/L    Neutrophils % 88.1 %    Lymphocytes % 5.1 %    Monocytes % 6.7 %    Eosinophils % 0.0 %    Basophils % 0.1 %    Absolute Neutrophils 6.0 1.8 - 7.8 10*9/L    Absolute Lymphocytes 0.3 (L) 1.1 - 3.6 10*9/L    Absolute Monocytes 0.5 0.3 - 0.8 10*9/L    Absolute Eosinophils 0.0 0.0 - 0.5 10*9/L    Absolute Basophils 0.0 0.0 - 0.1 10*9/L   POCT Glucose    Collection Time: 06/24/24  8:11 AM   Result Value Ref Range    Glucose, POC 457 (HH) 70 - 179 mg/dL       CBC - Results in Past 30 Days  Result Component Current Result Ref Range Previous Result Ref Range   HCT  32.9 (L) (06/24/2024) 39.0 - 48.0 % 32.3 (L) (05/28/2024) 39.0 - 48.0 %   HGB 10.7 (L) (06/24/2024) 12.9 - 16.5 g/dL 89.2 (L) (87/11/7972) 87.0 - 16.5 g/dL   MCH 72.0 (87/69/7974) 25.9 - 32.4 pg 28.1 (05/28/2024) 25.9 - 32.4 pg   MCHC 32.4 (06/24/2024) 32.0 - 36.0 g/dL 66.8 (87/11/7972) 67.9 - 36.0 g/dL   MCV 13.9 (87/69/7974) 77.6 - 95.7 fL 84.7 (05/28/2024) 77.6 - 95.7 fL   MPV 8.9 (06/24/2024) 6.8 - 10.7 fL 8.3 (05/28/2024) 6.8 - 10.7 fL   Platelet 192 (06/24/2024) 150 - 450 10*9/L 209 (05/28/2024) 150 - 450 10*9/L   RBC 3.83 (L) (06/24/2024) 4.26 - 5.60 10*12/L 3.82 (L) (05/28/2024) 4.26 - 5.60 10*12/L   WBC 6.8 (06/24/2024) 3.6 - 11.2 10*9/L 4.6 (05/28/2024) 3.6 - 11.2 10*9/L     BMP - Results in Past 30 Days  Result Component Current Result Ref Range Previous Result Ref Range   BUN 32 (H) (06/24/2024) 9 - 23 mg/dL 36 (H) (87/11/7972) 9 - 23 mg/dL   Chloride 94 (L) (87/69/7974) 98 - 107 mmol/L 96 (L) (05/28/2024) 98 - 107 mmol/L   CO2 20.0 (06/24/2024) 20.0 - 31.0 mmol/L 24.0 (05/28/2024) 20.0 - 31.0 mmol/L   Creatinine 11.93 (H) (06/24/2024) 0.73 - 1.18 mg/dL 89.51 (H) (87/11/7972) 9.26 - 1.18 mg/dL   Glucose 553 (HH) (87/69/7974) 70 - 179 mg/dL 847 (87/11/7972) 70 - 820 mg/dL   Potassium 5.0 (H) (87/69/7974) 3.4 - 4.8 mmol/L 4.1 (06/23/2024) 3.4 - 4.8 mmol/L   Sodium 133 (L) (06/24/2024) 135 - 145 mmol/L 139 (05/28/2024) 135 - 145 mmol/L     Coagulation - Results in Past 30 Days  Result Component Current Result Ref Range Previous Result Ref Range   INR 0.98 (06/23/2024)  1.09 (05/25/2024)    PT 11.2 (06/23/2024) 9.9 - 12.6 sec 12.4 (05/25/2024) 9.9 - 12.6 sec       LFT's - Results in Past 30 Days  Result Component Current Result Ref Range Previous Result Ref Range   Albumin 3.3 (L) (05/28/2024) 3.4 - 5.0 g/dL 3.3 (L) (87/12/7972) 3.4 - 5.0 g/dL   Alkaline Phosphatase 142 (H) (05/28/2024) 46 - 116 U/L 145 (H) (05/27/2024) 46 - 116 U/L   ALT <7 (L) (05/28/2024) 10 - 49 U/L <7 (L) (05/27/2024) 10 - 49 U/L   AST 22 (05/28/2024) <=34 U/L 22 (05/27/2024) <=34 U/L   Bilirubin, Direct 0.40 (H) (05/25/2024) 0.00 - 0.30 mg/dL Not in Time Range    Total Bilirubin 0.6 (05/28/2024) 0.3 - 1.2 mg/dL 0.8 (87/12/7972) 0.3 - 1.2 mg/dL     Relevant Studies/Radiology (if blank, then none):  IR Embolization - Arterial Non-Hemorrhage  Result Date: 06/24/2024  PROCEDURE: Splenic angiography and interventions EXAM: IR EMBOLIZATION - ARTERIAL NON-HEMORRHAGE ACCESSION: [id number] UN REPORT DATE: 06/24/2024 10:21 AM Procedural Personnel: - Attending physician(s): Dr. Gerard Gaskins - Fellow physician(s): Dr. Buena Pouch - Resident physician(s): None - Advanced practice provider(s): None - Pre-procedure diagnosis: Splenic artery aneurysm in the setting of liver transplant - Post-procedure diagnosis: Same - Indication: Other-splenic artery aneurysm - Additional clinical history: None _______________________________________________________________ PROCEDURE SUMMARY: - Arterial access: with ultrasound guidance - Selective mesenteric angiography: Celiac angiography - Superselective mesenteric angiography: Performed as described below -Splenic arterial interventions as described below - Additional procedure(s): None PROCEDURE DETAILS: Pre-procedure Consent: Informed consent for the procedure including risks, benefits and alternatives was obtained and time-out was performed prior to the procedure. Preparation: The site was prepared and draped using maximal sterile barrier technique including cutaneous antisepsis. Anesthesia/sedation -  Anesthesia/sedation: General anesthesia - Moderate sedation time (minutes): 0 Access - Local anesthesia was administered: Ultrasound was utilized to evaluate for potential sites of access and determine patency. Real time ultrasound was used to visualize needle entry into the vessel, and a permanent image was stored. - Sheath placed: 7 French Ansell - Laterality: Right - Vessel accessed: Common femoral artery - Access technique: Micropuncture set with 21 gauge needle - Vascular findings: patent, no significant calcifications, adequate size for closure device. Aortography - Vessel catheterized: Not performed - Findings: Not applicable Mesenteric angiography and interventions - The mesenteric arterial system was catheterized using: A combination of a 4 French C2, 5 French C2, 4 French Navicross, ultimately a 7 French Ansell sheath, 0.035 inch Glidewire, 0.018 inch Nitrex wire and 0.018 inch V18 wire.. - Variant anatomy: None - Selective mesenteric vessel catheterized: Celiac artery - Selective mesenteric vessel findings: Normal in caliber with tortuous splenic artery - Superselective mesenteric vessel(s) catheterized: Splenic artery - Superselective mesenteric vessel(s) findings: Markedly tortuous splenic artery with aneurysm of the splenic artery originating from the upper pole branch. The patent portion of the aneurysm measures approximately 1 cm x 1 cm. The native vessel at the site of aneurysm measures approximately 4.5 mm. - Additional vessel(s) catheterized: None - Additional vessel findings: Not applicable Stent - Stent location: Upper pole branch of the splenic artery at the level of the aneurysm - Stent type: Self-expanding covered stent - Stent: Viabahn - Stent length (mm): 25 - Stent diameter (mm): 6 Angioplasty The newly placed Viabahn stent was dilated to 5 mm with a Sterling balloon. - Post-intervention angiography: Completion angiography demonstrated widely patent stent with no further opacification of the aneurysm sac Closure - The sheath: was removed and hemostasis was achieved. - Access site angiography performed: Yes - Findings: Patent vessel with appropriate access level - Arterial closure technique: Perclose prostyle - Hemostasis achieved from closure technique: Yes - Duration of manual compression (minutes): 5 Contrast - Contrast agent: Iohexol  300 - Contrast volume (mL): 230 Radiation Dose - Fluoroscopy time (minutes): 88.1 - Reference air kerma (mGy): 4401 - Kerma area product (Gy-cm2): NA Additional Details - Registry event: V/3/g - Registry event device manufacturer: None - Registry event device name: None - Unique Device Identifiers (UDIs) for all devices used during the procedure: Not available - Specimens removed: None - Estimated blood loss (mL): 11-50 - Standardized report: [email or web address] Signer name: Gerard Gaskins I attest that I was present for the entire procedure. I reviewed the stored images and agree with the report as written. - Complications: No immediate complications.     - Angiography of the splenic artery demonstrates partially patent aneurysm of the upper pole branch of the splenic artery with patent portion measuring 1 cm x 1 cm. Deployment of covered Viabahn stent over the aneurysm with no further opacification of the aneurysm on completion imaging. Plan: Follow-up in 1 month with CT at that time. Will start on aspirin  85 daily. Resume Eliquis  postop day 1.     ______________________________________________________________________  Discharge Instructions:   Activity Instructions       Activity as tolerated     Additional Activity Instructions:    Because you received sedation today:    Rest for the next 24 hours  Do not drive a car, operate heavy machinery, drink alcohol or sign legal documents for 24 hours.  Avoid strenuous activity and heavy lifting (greater than 10 pounds) for 1 week.    Site care:    Dressing stays intact  for 2 days.  After two days, remove dressing and gently wash site. Dry and apply new clean dressing. Wash site and change dressing daily until healed.  Do not submerge site until healed. No tub baths, swimming pools until healed.      You may return to your usual diet unless otherwise directed.     Please call [facility] VIR clinic at [phone] and choose option 3 to speak to a nurse for any problems or questions following discharge including:    Bleeding that saturates bandage  If site bleeds, hold pressure to site.   Pain at site not controlled by regular pain medicines  Signs of infection at site: redness, swelling, pus draining, fever above 101.5    Calls received during normal business hours (Monday through Friday from 8:00 am to 4:30 pm) will be returned within one business day. Calls received after normal business hours OR on weekends will be returned during the next business day.     If this is an after-hours urgent matter, you can call the Hospital front desk at [phone] and ask to speak with the Interventional Radiology (VIR) doctor on call.    If you are experiencing a medical emergency, please call 911 or go to your nearest emergency room.               Diet Instructions       Discharge diet (specify)      Discharge Nutrition Therapy: Consistent Carb    Consistent Carb Level: Consistent Carb 75/75/75 (5/5/5)    Please control carb and sugar intake for the next 2 days since you received steroid during your GI procedure.                Other Instructions       Call MD for:  difficulty breathing, headache or visual disturbances      Call MD for:  extreme fatigue      Call MD for:  hives      Call MD for:  persistent dizziness or light-headedness      Call MD for:  persistent nausea or vomiting      Call MD for:  redness, tenderness, or signs of infection (pain, swelling, redness, odor or green/yellow discharge around incision site)      Call MD for:  severe uncontrolled pain      Call MD for: Temperature > 38.5 Celsius ( > 101.3 Fahrenheit)      Discharge instructions      - You were seen by our Interventional Radiologist for your splenic artery aneurysm for which a stent was placed. You were cleared by them for discharge.  - Resume your medications that were prescribed to you by both your Hepatologist and your Transplant Team (lactulose , rifaximin , ursodiol )  - Please follow-up with your Hepatologist and Transplant Team outpatient  - You will need follow-up with surgery re: hernia that was found, this can be done in the outpatient setting  - Take Eliquis  given your hx of DVTs - do not stop taking unless directed by a provider  - To help with bleeding after dialysis, see if the dialysis RNs can do quick clot to help stop the bleeding  - GI recommended the following:  ~ start on daily aspirin  85 mg for 3 months to make sure your stent remains patent  ~ CTA of abdomen scheduled for you in 1 month   ~ Discuss with your Nephrologist regarding abdominal pain potentially associated with dialysis  ~  Remove right groin dressing tomorrow, 12/31    Remove dressing in 48 hours              Follow Up instructions and Outpatient Referrals     Ambulatory Referral to General Surgery      Reason for referral: umbilical hernia    Call MD for:  difficulty breathing, headache or visual disturbances      Call MD for:  extreme fatigue      Call MD for:  hives      Call MD for:  persistent dizziness or light-headedness      Call MD for:  persistent nausea or vomiting      Call MD for:  redness, tenderness, or signs of infection (pain, swelling,   redness, odor or green/yellow discharge around incision site)      Call MD for:  severe uncontrolled pain      Call MD for: Temperature > 38.5 Celsius ( > 101.3 Fahrenheit)      Discharge instructions          Appointments which have been scheduled for you      Aug 14, 2024 1:00 PM  (Arrive by 12:45 PM)  NEW PODITARY with Kayla Artist Setters, DPM  [facility]ODIATRY [place] [facility]) [address]  [address]  [place] [address]  [phone]        Aug 19, 2024 11:00 AM  (Arrive by 10:45 AM)  RETURN HEPATOLOGY with Vena Lowers, MD  [facility] GI MEDICINE [place] [facility] REGION) [address]  [facility] 1 through 4  [place] [place] [id number]  [phone]        Aug 26, 2024 12:30 PM  (Arrive by 12:15 PM)  CTA ABDOMEN W WO CONTRAST with HBR CT RM 1  IMG CT HBR [facility]) [address]  [place] [address]  [phone]   On appt date:  Drink lots of water 24 hrs  Bring recent lab work  Take meds as usual  [employer] service fast streamer of current meds  Bring snack if diabetic    Let us  know if pt:  Allergic to contrast dyes  Diabetic  Pregnant or nursing  Claustrophobic    (Title:CTWCNTRST)            ______________________________________________________________________  Discharge Day Services:  BP (!) 167/73  - Pulse 65  - Temp 36.7 ??C (98.1 ??F) (Oral)  - Resp 18  - Ht 177.8 cm (5' 10)  - Wt (!) 117.9 kg (260 lb)  - SpO2 91%  - BMI 37.31 kg/m??   Pt seen on the day of discharge and determined appropriate for discharge.    Condition at Discharge: stable    Length of Discharge: I spent greater than 30 mins in the discharge of this patient.

## 2024-06-24 NOTE — Progress Notes (Signed)
 VIR Inpatient Progress Note    Service Date: 06/24/2024  Admit Date: 06/23/2024, Hospital Day: 2  Hospital Service: [facility])  Attending: Jenne Jenkins Kennel, FNP    Assessment     Richard [name] is a 58 y.o. 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 On ESRD via RUE fistula.  Has had worsening abdominal pain of uncertain etiology recently, patient feels it is associated with dialysis.     Patient underwent stenting of the hilar splenic artery aneurysm on 06/23/2024.    Interval Events/Subjective   No acute events overnight. Pain better controlled, though has still has some IV pain medication requirements. Groin feels much better per patient.       Plan   --ok for discharge from VIR perspective   --resume home eliquis  today  --please start on daily ASA 85mg  for at least 3 months for stent patency  --patient will follow up in 1 month with CTA of the abdomen at that time  --recommend further discussion with nephrology regarding abdominal pain potentially associated with dialysis  --can remove right groin dressing in 48 hours      Objective     Vitals:   Temp:  [36.2 ??C (97.2 ??F)-36.8 ??C (98.2 ??F)] 36.6 ??C (97.9 ??F)  Pulse:  [61-103] 82  SpO2 Pulse:  [59-70] 62  Resp:  [16-29] 17  BP: (110-156)/(57-100) 148/100  MAP (mmHg):  [81-116] 114  SpO2:  [91 %-100 %] 91 %    Lab Results   Component Value Date    WBC 6.8 06/24/2024    HGB 10.7 (L) 06/24/2024    HCT 32.9 (L) 06/24/2024    PLT 192 06/24/2024       Lab Results   Component Value Date    NA 133 (L) 06/24/2024    K 5.0 (H) 06/24/2024    CL 94 (L) 06/24/2024    CO2 20.0 06/24/2024    BUN 32 (H) 06/24/2024    CREATININE 11.93 (H) 06/24/2024    GLU 446 (HH) 06/24/2024    CALCIUM  8.2 (L) 06/24/2024    MG 2.3 06/24/2024    PHOS 4.0 06/24/2024       Lab Results   Component Value Date    BILITOT 0.6 05/28/2024    BILIDIR 0.40 (H) 05/25/2024    PROT 6.8 05/28/2024    ALBUMIN 3.3 (L) 05/28/2024    ALT <7 (L) 05/28/2024 AST 22 05/28/2024    ALKPHOS 142 (H) 05/28/2024    GGT 374 (H) 04/08/2024       Lab Results   Component Value Date    PT 11.2 06/23/2024    INR 0.98 06/23/2024    APTT 290.0 (HH) 01/19/2024         Physical Exam:  -General:  Appropriate, comfortable and in no apparent distress.   -Neurological: Moves all 4 extremities spontaneously.   -Pulmonary: Normal work of breathing.   -Abdomen: Soft, non-tender, non-distended. No rebound or guarding.  -Right groin access site soft, dressing with minimal blood, some bruising    This patient was discussed with Dr. Gerard Gaskins who is in agreement with the above assessment and plan.     Buena Pouch, MD  [facility] Interventional Radiology  PGY-6

## 2024-06-30 NOTE — Progress Notes (Signed)
 Dermatology Note     Assessment and Plan:      Nummular eczema, chronic, flaring, not at patient goal  - Diagnosis, treatment options, prognosis, risk/ benefit, and side effects of treatment were discussed with the patient.   - Restart triamcinolone  (KENALOG ) 0.1 % cream; Apply topically Two (2) times a day. Apply to itchy rash until skin is smooth, then stop. Restart as needed    Benign Lesions/ Findings:   Angioma(s)  Lentigo/Lentigines  Nevus/Nevi-Benign Appearing  Seborrheic Keratosis(es) - no irritation noted  - Reassurance provided regarding the benign appearance of lesions noted on exam today; no treatment is indicated in the absence of symptoms/changes.  - Reinforced importance of photoprotective strategies including liberal and frequent sunscreen use of a broad-spectrum SPF 30 or greater, use of protective clothing, and sun avoidance for prevention of cutaneous malignancy and photoaging.  Counseled patient on the importance of regular self-skin monitoring as well as routine clinical skin examinations as scheduled.     The patient was advised to call for an appointment should any new, changing, or symptomatic lesions develop.     RTC: Return in about 1 year (around 07/01/2025). or sooner as needed   _________________________________________________________________      Chief Complaint     Chief Complaint   Patient presents with    Onychomycosis     Pt coming in for follow up visit        HPI     Richard [name] is a 59 y.o. male who presents as a returning patient (last seen 10/19/2021) to Dermatology for a full body skin exam. Patient reports no specific lesions or rash of concern.     History of Present Illness  Richard [name] is a 59 year old male who presents for a dermatology follow-up visit.    He has dry skin on his hands and experiences pruritus in various spots on his body. He does not recall using triamcinolone  ointment in the past.    He is one week post-surgery and reports doing well.        The patient denies any other new or changing lesions or areas of concern.     Pertinent Past Medical History     No history of skin cancer  S/p liver transplant (2013)    Problem List    None        Family History:   Negative for melanoma    Past Medical History, Family History, Social History, Medication List, Allergies, and Problem List were reviewed in the rooming section of Epic.     ROS: Other than symptoms mentioned in the HPI, no fevers, chills, or other skin complaints    Physical Examination     GENERAL: Well-appearing male in no acute distress, resting comfortably.  NEURO: Alert and oriented, answers questions appropriately  PSYCH: Normal mood and affect  SKIN (Full Skin Exam): Examination of the face, eyelids, lips, nose, ears, neck, chest, abdomen, back, arms, legs, hands, feet, palms, soles, nails was performed, including scalp, including buttocks  - Angioma(s): Scattered red vascular papule(s) on the scattered diffusely  - Lentigo/lentigines: Scattered pigmented macules that are tan to brown in color and are somewhat non-uniform in shape and concentrated in the sun-exposed areas of the face and upper extremities  - Nevus/nevi: Scattered well-demarcated, regular, pigmented macule(s) and/or papule(s) on the scattered diffusely  - Seborrheic Keratosis(es): Stuck-on appearing keratotic papule(s) on the scattered diffusely, none irritated with redness, crusting, edema, and/or partial avulsion  -  eczematous patches of the lower extremities    All areas not commented on are within normal limits or unremarkable.           (Approved Template 03/08/2020)

## 2024-07-01 DIAGNOSIS — Z794 Long term (current) use of insulin: Principal | ICD-10-CM

## 2024-07-01 DIAGNOSIS — L814 Other melanin hyperpigmentation: Principal | ICD-10-CM

## 2024-07-01 DIAGNOSIS — L821 Other seborrheic keratosis: Principal | ICD-10-CM

## 2024-07-01 DIAGNOSIS — E1165 Type 2 diabetes mellitus with hyperglycemia: Principal | ICD-10-CM

## 2024-07-01 DIAGNOSIS — D1801 Hemangioma of skin and subcutaneous tissue: Principal | ICD-10-CM

## 2024-07-01 DIAGNOSIS — L309 Dermatitis, unspecified: Principal | ICD-10-CM

## 2024-07-01 DIAGNOSIS — D229 Melanocytic nevi, unspecified: Principal | ICD-10-CM

## 2024-07-01 MED ORDER — TRIAMCINOLONE ACETONIDE 0.1 % TOPICAL OINTMENT
Freq: Two times a day (BID) | TOPICAL | 1 refills | 0.00000 days | Status: CP
Start: 2024-07-01 — End: 2025-07-01

## 2024-07-01 MED ORDER — MOUNJARO 15 MG/0.5 ML SUBCUTANEOUS PEN INJECTOR
SUBCUTANEOUS | 3 refills | 28.00000 days | Status: CP
Start: 2024-07-01 — End: ?
  Filled 2024-07-07: qty 2, 28d supply, fill #0

## 2024-07-01 NOTE — Patient Instructions (Addendum)
 Skin Cancer Prevention: Care Instructions  Your Care Instructions     Skin cancer is the abnormal growth of cells in the skin. It usually appears as a growth that changes in color, shape, or size. This can be a sore that does not heal or a change in a wart or a mole. Skin cancer is almost always curable when found early and treated. So it is important to see your doctor if you have any of these changes in your skin.  Skin cancer is the most common type of cancer. It often appears on areas of the body that have been exposed to the sun, such as the head, face, neck, back, chest, or shoulders.  Follow-up care is a key part of your treatment and safety. Be sure to make and go to all appointments, and call your doctor if you are having problems. It's also a good idea to know your test results and keep a list of the medicines you take.  How can you care for yourself at home?  Wear a wide-brimmed hat and long sleeves and pants if you are going to be outdoors for a long time.  Avoid the sun between 10 a.m. and 4 p.m., which is the peak time for UV rays.  Wear sunscreen on exposed skin. Make sure to use a broad-spectrum sunscreen that has a sun protection factor (SPF) of 30 or higher. Use it every day, even when it is cloudy.  Do not use tanning booths or sunlamps.  Use lip balm or cream that has sun protection factor (SPF) to protect your lips from getting sunburned.  Wear sunglasses that block UV rays.  When should you call for help?   Call your doctor now or seek immediate medical care if:    You have signs of infection, such as:  Increased pain, swelling, warmth, or redness.  Red streaks leading from the area.  Pus draining from the area.  A fever.   Watch closely for changes in your health, and be sure to contact your doctor if:    You see a change in your skin, such as a growth or mole that:  Grows bigger. This may happen very slowly.  Changes color.  Changes shape.  Starts to bleed easily.     You have swollen glands in your armpits, groin, or neck.     You do not get better as expected.   Where can you learn more?  Go to [facility] at https://myuncchart.org  Select Patient Education under [employer]. Enter P392 in the search box to learn more about Skin Cancer Prevention: Care Instructions.  Current as of: June 12, 2019               Content Version: 12.8  ?? 2006-2021 [employer].   Care instructions adapted under license by [facility]. If you have questions about a medical condition or this instruction, always ask your healthcare professional. [employer] disclaims any warranty or liability for your use of this information.       Meet your team:     Your intake nurse is: Bloette    Please remember to fill out the survey you will receive after your visit. Your comments help us  continue to improve our care.      Thanks in advance!      [facility] Dermatology Clinical Staff

## 2024-07-01 NOTE — Progress Notes (Signed)
 He is s/p creation of AVF in the right upper arm  This was done in 2 stages with a basilic vein transposition  He returns due to difficulties with cannulation  This has been the experience of the inpatient as well as outpatient staff  I believe they are having difficulty locating the fistula as there are multiple scars.   To my exam, the fistula has a brisk thrill and is fairly easy to delineate, however it is quite medial on the upper arm, more so than usual.  I drew lines with a heavy marker to outline the fistula and anticipated cannulation zone  I have ordered a PVL study to ensure that the size, flow, and depth are reasonable for the fistula   If there are still problems, will consider revising, possibly with an interposition graft as there will not be a great deal of vein length to do anything else.

## 2024-07-01 NOTE — Telephone Encounter (Addendum)
 Spoke with Richard [name].  Appt scheduled for 1345 today with Dr. Marchelle.        ----- Message from Beryle Glyn Greaves, FNP sent at 07/01/2024  8:33 AM EST -----  Regarding: Appointment request  Good morning,    Patient was inpatient and when attempting to cannulate inpatient x 3 techs attempted to accees AV fistula and were unsuccessful. Also here at [facility] in the outpatient setting, experienced dialysis techs also struggle with cannulation. All techs say that the access is too deep. Patient would like to schedule an appointment. His current dialysis schedule is [facility]THS 0540 to 0940. He prefers appointments after dialysis on dialysis days. Gracias!    Best number to reach Richard [name] at is PH# [phone].        Beryle Glyn Greaves, FNP  [facility] Nephrology [facility] Hypertension   [address]  [address]  [place], [place]. [address]  Ph#  [phone]  Fax# [phone]

## 2024-07-02 NOTE — Progress Notes (Signed)
 New Patient Clinic Note    Referring Physician :  Chiquita Sine Coletti    History of Present Illness:    Richard [name] is a 59 y.o. male with history of CHF, T2DM, ESRD, Hepatic cirrhosis s/p liver transplant, HTN, who presents for new patient evaluation of polyneuropathy.     Today patient reports, symptoms began gradually 1 year ago. Patient reports being on dialysis on Tuesday, Thursday, and Saturday. Endorses 8/10 pain daily. He states it affects his walking. Patient has taken Gabapentin  however, it does not provide relief. He kicked his bed 2 days ago.     Last reported A1C 10.6 on 05/25/2024.     Review of Systems:     Musculoskeletal:  Denies back pain or joint pain   Integument:  Denies rash   Neurologic:  Denies headache, focal weakness or sensory changes     BP 112/69  - Pulse 91  - Temp 36.8 ??C (98.2 ??F)     Allergies:  Allergies[1]    Medications:   Encounter Medications[2]    Medical History:  Past Medical History[3]    Surgical History:  Past Surgical History[4]    Social History:  Social History[5]    Family History:  Family History[6]      Physical Exam:   Consitutional: No acute distress, alert and oriented.    Vascular:   Right DP 2/4 PT difficult to palpate due to edema. PT and DP triphasic on handheld doppler.   Nonpalpable DP and PT on left food. Triphasic PT and no signal DP on handheld doppler. DP possibly occluded.   Temperature gradient is increased   Capillary refill brisk x 10  No varicosities noted bilaterally    Neurologic:    Right Left   10 gram filament []  Present  [x]  Not present  []  Intermittent []  Present  [x]  Not present  []  Intermittent   Positional sense great toe joint [x]  Present  []  Not present []  Present  [x]  Not present   128 cps vibratory sense  Loss of sensation on right  [x]  Diminished  []  Intact [x]  Diminished  []  Intact   Deep tendon reflexes 2/5 [x]  2/5 normal  []  Abnormal [x]  2/5 normal  []  Abnormal   Babinski sign []  Present  [x]  Not present []  Present  [x]  Not present        Orthopedic:   Average arch morphology. Slightly high bilaterally.   No evidence of Charcot    Mild hammertoe on 5th and 2nd toe of left foot  Mild hammertoe on 3rd, 4th, and 5th toe on the right   Distal 5th toe of right foot and forefoot with pain.       Dermatologic:   Xerosis on foot and lower leg   No callus development   No interdigital fissures or maceration bilateral feet   Dried blood Distal tip 5th toe of right foot     Test Results  None    Imaging:   XR right foot 3 or more views- 07/03/2024  Impression   1.  No displaced fractures. Extensive diffuse osteopenia noted which does limit evaluation for nondisplaced fractures.     Results have been independently reviewed and interpreted.     PROCEDURE:   None    Assessment/Recommendations:    Plan:     Uncontrolled type 2 diabetes with increased hyperglycemia with recent hospitalization.   Painful peripheral neuropathy-  Patient was given instructions on diabetic foot care, including not going barefoot,  checking water temperature with elbow, and frequent monitoring of feet for open sores or wounds. Patient is on gabapentin  with no relief. Patient is on dialysis for this reason we would recommend other options. Patient was recommended TENS unit with instructions provided in AVS. We also discussed use of Capsin. I was in contact with patients dialysis center so they can help with pain management as he has failed Gabapentin . I personally spoke with  with Dr. Teresia who is the patients Nephrologist. Plan of care agreed due to complexity and hepatic encephalopathy referral to pain clinic as well and not to titrate gabapentin  any higher.    Contusion of left foot- X ray was ordered. Patient was told to return to clinic after he obtains imaging. Results dispensed today- no facture. X- ray results were discussed with the patient. At this time there is no evidence of fracture an no further treatment was recommended. Patient was recommended ice. ______________________________________________________________________    Documentation assistance was provided by [name] Muzzy, Scribe, on July 03, 2024 at 12:51 PM for Kayla Setters, DPM.    ----------------------------------------------------------------------------------------------------------------------  July 03, 2024 2:15 PM. Documentation assistance provided by the Scribe. I was present during the time the encounter was recorded. The information recorded by the Scribe was done at my direction and has been reviewed and validated by me.  ----------------------------------------------------------------------------------------------------------------------         [1] No Known Allergies  [2]   Outpatient Encounter Medications as of 07/03/2024   Medication Sig Dispense Refill    apixaban  (ELIQUIS ) 5 mg Tab Take 1 tablet (5 mg total) by mouth two (2) times a day. 180 tablet 4    apixaban  (ELIQUIS ) 5 mg Tab Take 1 tablet (5 mg total) by mouth two (2) times a day. 180 tablet 0    aspirin  81 MG chewable tablet Chew 1 tablet (81 mg total) daily. Stop taking after 30 days (when you finish this bottle). 30 tablet 0    blood sugar diagnostic (GLUCOSE BLOOD) Strp Use to check blood sugar as directed with insulin  3 times a day and for symptoms of high or low blood sugar. 100 strip 0    blood-glucose meter kit Use as directed to check blood sugars 1 each 11    blood-glucose meter kit Use as instructed 1 each 0    blood-glucose sensor (DEXCOM G7 SENSOR) Devi Use to continuously monitor blood glucose.  Change sensor every 10 days. 3 each 11    calcitriol  (ROCALTROL ) 0.5 MCG capsule Take 1 capsule (0.5 mcg total) by mouth 3 (three) times a week in dialysis (for use in dialysis). 36 capsule 0    CHOLECALCIFEROL , VITAMIN D3, (VITAMIN D3 ORAL) Take 2,000 Units by mouth in the morning.      gabapentin  (NEURONTIN ) 300 MG capsule Take 1 capsule (300 mg total) by mouth nightly. 30 capsule 12    insulin  aspart (NOVOLOG  FLEXPEN U-100 INSULIN ) 100 unit/mL (3 mL) injection pen Inject 0.3 mL (30 Units total) under the skin Three (3) times a day before meals. 30 mL 2    insulin  degludec (TRESIBA  FLEXTOUCH U-200) 200 unit/mL (3 mL) InPn Inject 0.35 mL (70 Units total) under the skin daily. Max dose of 100 units per day.     If fasting glucose is greater than 180, increase basal insulin  by 5 units. Recheck fasting glucose the following morning and continue to increase by 5 units daily until fasting sugar less than 180. (Patient taking differently: Inject 0.35 mL (70 Units total) under  the skin daily. Max dose of 100 units per day. Less at times per patient    If fasting glucose is greater than 180, increase basal insulin  by 5 units. Recheck fasting glucose the following morning and continue to increase by 5 units daily until fasting sugar less than 180.) 9 mL 2    lactulose  (CEPHULAC ) 20 gram packet Take 1 packet (20 g total) by mouth Three (3) times a day. Titrate to 3-5 bowel movements per day. 90 packet 2    lancets Misc Use to check blood sugar as directed with insulin  3 times a day and for symptoms of high or low blood sugar. 100 each 0    mycophenolate  (CELLCEPT ) 250 mg capsule Take 1 capsule (250 mg total) by mouth two (2) times a day. 180 capsule 3    pantoprazole  (PROTONIX ) 40 MG tablet Take 1 tablet (40 mg total) by mouth daily as needed.      pen needle, diabetic (TRUEPLUS PEN NEEDLE) 32 gauge x 5/32 (4 mm) Ndle Use with insulin  up to 4 times a day as needed. 100 each 0    pen needle, diabetic 32 gauge x 5/32 (4 mm) Ndle Use with insulin  up to 4 times daily as needed. 100 each 0    rifAXIMin  (XIFAXAN ) 550 mg Tab Take 1 tablet (550 mg total) by mouth two (2) times a day.      sevelamer  (RENVELA ) 800 mg tablet Take 3 tablets (2,400 mg total) by mouth Three (3) times a day with a meal. 270 tablet 2    tacrolimus  (PROGRAF ) 0.5 MG capsule Take 1 capsule (0.5 mg total) by mouth two (2) times a day. 360 capsule 3    triamcinolone  (KENALOG ) 0.1 % ointment Apply topically two (2) times a day. Apply to affected area twice daily until improved or for up to 2 weeks, whichever is sooner. Break for 1-2 weeks. Restart as needed. 80 g 1    ursodiol  (ACTIGALL ) 300 mg capsule Take 2 capsules (600 mg total) by mouth two (2) times a day.      blood-glucose meter,continuous (DEXCOM G6 RECEIVER) Misc 1 each by Miscellaneous route in the morning. Dispense 1 receiver annually.  Sent to [facility]. 1 each 1    tirzepatide  (MOUNJARO ) 15 mg/0.5 mL PnIj Inject 15 mg under the skin every seven (7) days. 2 mL 3    [DISCONTINUED] oxyCODONE  (ROXICODONE ) 5 MG immediate release tablet Take 1 tablet (5 mg total) by mouth every four (4) hours as needed for up to 5 days. (Patient not taking: Reported on 07/03/2024) 10 tablet 0     No facility-administered encounter medications on file as of 07/03/2024.   [3]   Past Medical History:  Diagnosis Date    CHF (congestive heart failure) (CMS-HCC)     COVID-19 07/18/2019    Diabetes mellitus (CMS-HCC)     Family history of malignant neoplasm of prostate 06/23/2015    Heart disease     Heart murmur     Hepatic cirrhosis    (CMS-HCC) 06/23/2015    Overview:  Secondary to NASH; followed by Dr. Lindaann, GI.  Last Assessment & Plan:  Relevant Hx: Course: Daily Update: Today's Plan:     Hypertension     Hypogonadism in male 02/12/2014    Kidney stone     Liver cirrhosis secondary to NASH (nonalcoholic steatohepatitis) (CMS-HCC)     s/p liver transplant 2013    Obesity    [4]   Past Surgical History:  Procedure Laterality Date    CHOLECYSTECTOMY      liver transpant      LIVER TRANSPLANTATION  06/27/2011    PR ANASTOMOSIS,AV,ANY SITE Right 02/20/2024    Procedure: ARTERIOVENOUS ANASTOMOSIS, OPEN; DIRECT, ANY SITE, UPPER EXTREMITY;  Surgeon: Marchelle Kitchens, MD;  [facility];  Service: General Surgery    PR AV ANAST,UP ARM BASILIC VEIN TRANSPOSIT Right 03/31/2024    Procedure: ARTERIOVENOUS ANASTOMOSIS, OPEN; BY UPPER ARM BASILIC VEIN TRANSPOSITION;  Surgeon: Marchelle Kitchens, MD;  Location: [facility] OR [facility];  Service: General Surgery    PR AV FIST REVISE GRFT,W THROMBECTOMY Right 01/15/2023    Procedure: REVISION, ARTERIOVENOUS FISTULA W/ THROMBECTOMY, AUTOGENOUS OR NONAUTOGENOUS DIALYSIS GRAFT (SEP. PROC), LOWER EXTREMITY;  Surgeon: Marchelle Kitchens, MD;  Location: [facility] OR [facility];  Service: General Surgery    PR COLONOSCOPY FLX DX W/COLLJ SPEC WHEN PFRMD N/A 01/24/2024    Procedure: COLONOSCOPY, FLEXIBLE, PROXIMAL TO SPLENIC FLEXURE; DIAGNOSTIC, W/WO COLLECTION SPECIMEN BY BRUSH OR WASH;  Surgeon: Erick Prentice HERO, MD;  Location: [facility];  Service: Gastroenterology    PR COLONOSCOPY W/BIOPSY SINGLE/MULTIPLE N/A 08/13/2018    Procedure: COLONOSCOPY, FLEXIBLE, PROXIMAL TO SPLENIC FLEXURE; WITH BIOPSY, SINGLE OR MULTIPLE;  Surgeon: Thedora Alm Plain, MD;  [facility]: [facility];  Service: Gastroenterology    PR COLSC FLX W/RMVL OF TUMOR POLYP LESION SNARE TQ N/A 08/13/2018    Procedure: COLONOSCOPY FLEX; W/REMOV TUMOR/LES BY SNARE;  Surgeon: Thedora Alm Plain, MD;  Location: [facility];  Service: Gastroenterology    PR CREAT AV FISTULA,NON-AUTOGENOUS GRAFT Right 11/29/2022    Procedure: CREATE AV FISTULA (SEPARATE PROC); NONAUTOGENOUS GRAFT (EG, BIOLOGICAL COLLAGEN, THERMOPLASTIC GRAFT), UPPER EXTREMITY;  Surgeon: Marchelle Kitchens, MD;  Location: [facility] OR [facility];  Service: General Surgery    PR UPPER GI ENDOSCOPY,BIOPSY N/A 07/13/2015    Procedure: UGI ENDOSCOPY; WITH BIOPSY, SINGLE OR MULTIPLE;  Surgeon: Elspeth Jerilynn Reek, MD;  Location: GI [facility];  Service: Gastroenterology    PR UPPER GI ENDOSCOPY,BIOPSY N/A 02/13/2023    Procedure: UGI ENDOSCOPY; WITH BIOPSY, SINGLE OR MULTIPLE;  Surgeon: Minnie Krystal Claude, MD;  Location: GI [facility];  Service: Gastroenterology    PR UPPER GI ENDOSCOPY,DIAGNOSIS N/A 01/24/2024    Procedure: UGI ENDO, INCLUDE ESOPHAGUS, STOMACH, & DUODENUM &/OR JEJUNUM; DX W/WO COLLECTION SPECIMN, BY BRUSH OR WASH;  Surgeon: Erick Prentice HERO, MD;  Location: [facility];  Service: Gastroenterology   [5]   Social History  Socioeconomic History    Marital status: Divorced     Spouse name: Tulia    Number of children: 1    Years of education: 12    Highest education level: 12th grade   Occupational History    Occupation: medically retired    Occupation: Former Community Education Officer   Tobacco Use    Smoking status: Never     Passive exposure: Past    Smokeless tobacco: Never   Vaping Use    Vaping status: Never Used   Substance and Sexual Activity    Alcohol use: Never    Drug use: Never    Sexual activity: Yes     Partners: Female     Birth control/protection: None   Other Topics Concern    Do you use sunscreen? No    Tanning bed use? No    Are you easily burned? No    Excessive sun exposure? No    Blistering sunburns? No  Social History Narrative    From [place]    He was in the [employer].    Now lives in [place]    He works in Engineer, [employer]    Married    1 child (age 6.5)            As of 06/19/24 2025pm:    Divorced    Lives alone in a rented room.    Unemployed; former community education officer    2 children and step child (one in jail)     Social Drivers of Health     Food Insecurity: No Food Insecurity (01/18/2024)    Hunger Vital Sign     Worried About Running Out of Food in the Last Year: Never true     Ran Out of Food in the Last Year: Never true   Tobacco Use: Low Risk (07/03/2024)    Patient History     Smoking Tobacco Use: Never     Smokeless Tobacco Use: Never     Passive Exposure: Past   Transportation Needs: No Transportation Needs (01/18/2024)    PRAPARE - Transportation     Lack of Transportation (Medical): No     Lack of Transportation (Non-Medical): No   Alcohol Use: Not At Risk (06/23/2024)    Alcohol Use     How often do you have a drink containing alcohol?: Never     How many drinks containing alcohol do you have on a typical day when you are drinking?: 1 - 2     How often do you have 5 or more drinks on one occasion?: Never   Housing: Low Risk (01/18/2024)    Housing     Within the past 12 months, have you ever stayed: outside, in a car, in a tent, in an overnight shelter, or temporarily in someone else's home (i.e. couch-surfing)?: No     Are you worried about losing your housing?: No   Utilities: Low Risk (01/18/2024)    Utilities     Within the past 12 months, have you been unable to get utilities (heat, electricity) when it was really needed?: No   Interpersonal Safety: Not At Risk (06/23/2024)    Interpersonal Safety     Unsafe Where You Currently Live: No     Physically Hurt by Anyone: No     Abused by Anyone: No   Substance Use: Low Risk (06/23/2024)    Substance Use     In the past year, how often have you used prescription drugs for non-medical reasons?: Never     In the past year, how often have you used illegal drugs?: Never     In the past year, have you used any substance for non-medical reasons?: No   Financial Resource Strain: Low Risk (01/18/2024)    Overall Financial Resource Strain (CARDIA)     Difficulty of Paying Living Expenses: Not hard at all   Health Literacy: Low Risk (11/09/2023)    Health Literacy     : Never   [6]   Family History  Problem Relation Age of Onset    Diabetes Father     Diabetes Paternal Grandfather     Liver disease Maternal Uncle     Cancer Maternal Grandmother     Melanoma Neg Hx     Basal cell carcinoma Neg Hx     Squamous cell carcinoma Neg Hx     Kidney disease Neg Hx

## 2024-07-02 NOTE — Telephone Encounter (Signed)
 This primary coordinator called Richard [name] to check in after his recent procedure and request updated labs as soon as he can get them.     Pt states he is feeling better than we was the first few days after the VIR procedure - IR Embo of artery. He states he was extremely sore afterwards but is now in higher spirits.       No NVD or fevers. No issues with encephalopathy since his last admission. Getting his scheduled hemodialysis via fistula.     This coordinator will call him with any results from his labs that he plans to get drawn tomorrow at [facility].

## 2024-07-03 ENCOUNTER — Ambulatory Visit: Admit: 2024-07-03 | Discharge: 2024-07-03 | Payer: BLUE CROSS/BLUE SHIELD

## 2024-07-03 ENCOUNTER — Ambulatory Visit: Admit: 2024-07-03 | Discharge: 2024-07-03 | Payer: BLUE CROSS/BLUE SHIELD | Attending: Podiatrist | Primary: Podiatrist

## 2024-07-03 ENCOUNTER — Inpatient Hospital Stay: Admit: 2024-07-03 | Discharge: 2024-07-03 | Payer: BLUE CROSS/BLUE SHIELD

## 2024-07-03 DIAGNOSIS — S9031XA Contusion of right foot, initial encounter: Principal | ICD-10-CM

## 2024-07-03 DIAGNOSIS — E1142 Type 2 diabetes mellitus with diabetic polyneuropathy: Principal | ICD-10-CM

## 2024-07-03 DIAGNOSIS — M79671 Pain in right foot: Principal | ICD-10-CM

## 2024-07-03 DIAGNOSIS — M79672 Pain in left foot: Principal | ICD-10-CM

## 2024-07-03 LAB — CBC W/ AUTO DIFF
BASOPHILS ABSOLUTE COUNT: 0.1 10*9/L (ref 0.0–0.1)
BASOPHILS RELATIVE PERCENT: 1 %
EOSINOPHILS ABSOLUTE COUNT: 0.1 10*9/L (ref 0.0–0.5)
EOSINOPHILS RELATIVE PERCENT: 2.5 %
HEMATOCRIT: 38 % — ABNORMAL LOW (ref 39.0–48.0)
HEMOGLOBIN: 12.2 g/dL — ABNORMAL LOW (ref 12.9–16.5)
LYMPHOCYTES ABSOLUTE COUNT: 0.8 10*9/L — ABNORMAL LOW (ref 1.1–3.6)
LYMPHOCYTES RELATIVE PERCENT: 13.9 %
MEAN CORPUSCULAR HEMOGLOBIN CONC: 32.1 g/dL (ref 32.0–36.0)
MEAN CORPUSCULAR HEMOGLOBIN: 27.4 pg (ref 25.9–32.4)
MEAN CORPUSCULAR VOLUME: 85.2 fL (ref 77.6–95.7)
MEAN PLATELET VOLUME: 9 fL (ref 6.8–10.7)
MONOCYTES ABSOLUTE COUNT: 0.6 10*9/L (ref 0.3–0.8)
MONOCYTES RELATIVE PERCENT: 10.8 %
NEUTROPHILS ABSOLUTE COUNT: 4.1 10*9/L (ref 1.8–7.8)
NEUTROPHILS RELATIVE PERCENT: 71.8 %
NUCLEATED RED BLOOD CELLS: 0 /100{WBCs} (ref <=4)
PLATELET COUNT: 183 10*9/L (ref 150–450)
RED BLOOD CELL COUNT: 4.46 10*12/L (ref 4.26–5.60)
RED CELL DISTRIBUTION WIDTH: 14.7 % (ref 12.2–15.2)
WBC ADJUSTED: 5.7 10*9/L (ref 3.6–11.2)

## 2024-07-03 LAB — COMPREHENSIVE METABOLIC PANEL
ALBUMIN: 3.5 g/dL (ref 3.4–5.0)
ALKALINE PHOSPHATASE: 210 U/L — ABNORMAL HIGH (ref 46–116)
ALT (SGPT): <7 U/L — ABNORMAL LOW (ref 10–49)
ANION GAP: 17 mmol/L — ABNORMAL HIGH (ref 5–14)
AST (SGOT): 18 U/L (ref <=34)
BILIRUBIN TOTAL: 1 mg/dL (ref 0.3–1.2)
BLOOD UREA NITROGEN: 16 mg/dL (ref 9–23)
BUN / CREAT RATIO: 3
CALCIUM: 9.5 mg/dL (ref 8.7–10.4)
CHLORIDE: 94 mmol/L — ABNORMAL LOW (ref 98–107)
CO2: 28.5 mmol/L (ref 20.0–31.0)
CREATININE: 6.28 mg/dL — ABNORMAL HIGH (ref 0.73–1.18)
EGFR CKD-EPI (2021) MALE: 10 mL/min/1.73m2 — ABNORMAL LOW (ref >=60)
GLUCOSE RANDOM: 239 mg/dL — ABNORMAL HIGH (ref 70–179)
POTASSIUM: 5.2 mmol/L — ABNORMAL HIGH (ref 3.5–5.1)
PROTEIN TOTAL: 7.6 g/dL (ref 5.7–8.2)
SODIUM: 139 mmol/L (ref 135–145)

## 2024-07-03 LAB — PHOSPHORUS: PHOSPHORUS: 3.9 mg/dL (ref 2.4–5.1)

## 2024-07-03 LAB — VITAMIN B12: VITAMIN B-12: 694 pg/mL (ref 211–911)

## 2024-07-03 LAB — MAGNESIUM: MAGNESIUM: 2.1 mg/dL (ref 1.6–2.6)

## 2024-07-03 LAB — GAMMA GT: GAMMA GLUTAMYL TRANSFERASE: 129 U/L — ABNORMAL HIGH (ref 0–73)

## 2024-07-03 LAB — BILIRUBIN, DIRECT: BILIRUBIN DIRECT: 0.4 mg/dL — ABNORMAL HIGH (ref 0.00–0.30)

## 2024-07-03 NOTE — Patient Instructions (Addendum)
 During your visit today the Doctor has ordered for you:  Xrays of     You can get the X-rays at any [facility] Radiology Department.   Here is the closest one to this Clinic:    [facility]  Address: [address], [place], [place] [address]      Patient Education        Diabetes Foot Health: Care Instructions  When you have diabetes, your feet need extra care. Diabetes can damage nerves and blood vessels in your feet. You may not feel a blister, callus, or other injury. It could become a larger sore (ulcer), or it could lead to a serious infection. Taking care of your feet can help prevent these problems.  How can you care for your feet?  Caring for your feet can be quick and easy. You may want to do your daily foot care when you???re bathing or getting ready for bed.   Check your feet daily for blisters or sores. Use a mirror or have someone help you, if needed.    Wash your feet every day. Use warm (not hot) water. Pat your feet dry. Don???t rub. And dry well between your toes.    Put a thin layer of lotion on your feet. Don???t put lotion between your toes. Stop using any lotion that causes a rash.    Talk to your doctor about trimming your own toenails. Have them trimmed at your doctor???s office or at a foot clinic (podiatrist), if needed. This is especially important if you have neuropathy or vision loss, or if your toenails are thick and yellow.    If you trim your own toenails, trim them straight across. Trim them after you bathe. Use a nail file to smooth sharp edges.    Use proper footwear. Don???t wear sandals or shoes with very thin soles, and don???t go barefoot.    Tell your doctor if you have a foot problem. Get treatment for a foot problem right away, even if it???s something small like a blister.    Track your blood sugar to keep it in your target range. Follow your meal plan, and try to be active each day. Take medicines as prescribed.    If you smoke, quit or cut back as much as you can. Talk to your doctor if you need help quitting.   How can you choose shoes?    Wear shoes that fit well. Look for shoes that have plenty of space around the toes. Always wear socks with your shoes.   Break in new shoes by wearing them for no more than an hour a day for several days. Choose shoes made of cloth or leather, not plastic.   When should you call for help?    Call your doctor now or seek immediate medical care if:  You have a foot sore, an ulcer or break in the skin, bleeding corns or calluses, or an ingrown toenail.  You have blue or black areas, which can mean bruising or blood flow problems.  You have peeling skin or tiny blisters between your toes or cracking or oozing of the skin.  You have a fever for more than 24 hours and a foot sore.  You have new numbness or tingling in your feet that does not go away after you move your feet or change positions.  You have unexplained or unusual swelling of the foot or ankle.  Watch closely for changes in your health,  and be sure to contact your doctor if:  You cannot do proper foot care.  Follow-up care is a key part of your treatment and safety. Be sure to make and go to all appointments, and call your doctor if you are having problems. It's also a good idea to know your test results and keep a list of the medicines you take.  Where can you learn more?  Go to [facility] at [email or web address] in the Menu. Enter A739 in the search box to learn more about Diabetes Foot Health: Care Instructions.  Current as of: December 31, 2023  Content Version: 14.6  ?? 2024-2025 [place], [place].   Care instructions adapted under license by [facility]. If you have questions about a medical condition or this instruction, always ask your healthcare professional. Romayne Alderman, [facility], disclaims any warranty or liability for your use of this information.

## 2024-07-04 LAB — TACROLIMUS LEVEL: TACROLIMUS BLOOD: 4.2 ng/mL

## 2024-07-04 NOTE — Telephone Encounter (Signed)
 Called patient to determine if his tac level was a true trough.    Yes true trough.     Keeping tac at current level per Dr. Cecille.    Has follow up for general surgery for shunt on 07/10/24

## 2024-07-07 MED FILL — LACTULOSE 20 GRAM ORAL PACKET: ORAL | 20 days supply | Qty: 60 | Fill #1

## 2024-07-07 MED FILL — GABAPENTIN 300 MG CAPSULE: ORAL | 30 days supply | Qty: 30 | Fill #8

## 2024-07-07 MED FILL — DEXCOM G7 SENSOR DEVICE: ORAL | 30 days supply | Qty: 3 | Fill #4

## 2024-07-07 MED FILL — TRUEPLUS PEN NEEDLE 32 GAUGE X 5/32" (4 MM): ORAL | 25 days supply | Qty: 100 | Fill #0

## 2024-07-07 NOTE — Progress Notes (Signed)
 Richard [name] has been contacted in regards to their refill of Prograf  and mycophenolate . At this time, they have declined refill due to patient stating he had enough on hand. Refill assessment call date has been updated per the patient's request.

## 2024-07-08 ENCOUNTER — Ambulatory Visit
Admission: EM | Admit: 2024-07-08 | Discharge: 2024-07-11 | Disposition: A | Discharge status: Home / Self Care | Payer: BLUE CROSS/BLUE SHIELD | Source: Intra-hospital

## 2024-07-08 ENCOUNTER — Inpatient Hospital Stay: Admit: 2024-07-08 | Discharge: 2024-07-09 | Payer: BLUE CROSS/BLUE SHIELD

## 2024-07-08 ENCOUNTER — Inpatient Hospital Stay
Admission: EM | Admit: 2024-07-08 | Discharge: 2024-07-11 | Disposition: A | Discharge status: Home / Self Care | Payer: BLUE CROSS/BLUE SHIELD | Source: Intra-hospital

## 2024-07-08 ENCOUNTER — Encounter
Admission: EM | Admit: 2024-07-08 | Discharge: 2024-07-11 | Disposition: A | Discharge status: Home / Self Care | Payer: BLUE CROSS/BLUE SHIELD | Source: Intra-hospital

## 2024-07-08 LAB — CBC W/ AUTO DIFF
BASOPHILS ABSOLUTE COUNT: 0.1 10*9/L (ref 0.0–0.1)
BASOPHILS RELATIVE PERCENT: 1.1 %
EOSINOPHILS ABSOLUTE COUNT: 0.1 10*9/L (ref 0.0–0.5)
EOSINOPHILS RELATIVE PERCENT: 2.1 %
HEMATOCRIT: 36.2 % — ABNORMAL LOW (ref 39.0–48.0)
HEMOGLOBIN: 11.9 g/dL — ABNORMAL LOW (ref 12.9–16.5)
LYMPHOCYTES ABSOLUTE COUNT: 0.5 10*9/L — ABNORMAL LOW (ref 1.1–3.6)
LYMPHOCYTES RELATIVE PERCENT: 9.2 %
MEAN CORPUSCULAR HEMOGLOBIN CONC: 32.9 g/dL (ref 32.0–36.0)
MEAN CORPUSCULAR HEMOGLOBIN: 28.3 pg (ref 25.9–32.4)
MEAN CORPUSCULAR VOLUME: 86 fL (ref 77.6–95.7)
MEAN PLATELET VOLUME: 9.2 fL (ref 6.8–10.7)
MONOCYTES ABSOLUTE COUNT: 0.5 10*9/L (ref 0.3–0.8)
MONOCYTES RELATIVE PERCENT: 9.4 %
NEUTROPHILS ABSOLUTE COUNT: 4.4 10*9/L (ref 1.8–7.8)
NEUTROPHILS RELATIVE PERCENT: 78.2 %
NUCLEATED RED BLOOD CELLS: 0 /100{WBCs} (ref <=4)
PLATELET COUNT: 203 10*9/L (ref 150–450)
RED BLOOD CELL COUNT: 4.21 10*12/L — ABNORMAL LOW (ref 4.26–5.60)
RED CELL DISTRIBUTION WIDTH: 14.8 % (ref 12.2–15.2)
WBC ADJUSTED: 5.6 10*9/L (ref 3.6–11.2)

## 2024-07-08 LAB — COMPREHENSIVE METABOLIC PANEL
ALBUMIN: 3.2 g/dL — ABNORMAL LOW (ref 3.4–5.0)
ALKALINE PHOSPHATASE: 149 U/L — ABNORMAL HIGH (ref 46–116)
ALT (SGPT): <7 U/L — ABNORMAL LOW (ref 10–49)
ANION GAP: 18 mmol/L — ABNORMAL HIGH (ref 5–14)
AST (SGOT): 18 U/L (ref <=34)
BILIRUBIN TOTAL: 0.7 mg/dL (ref 0.3–1.2)
BLOOD UREA NITROGEN: 8 mg/dL — ABNORMAL LOW (ref 9–23)
BUN / CREAT RATIO: 1
CALCIUM: 8.7 mg/dL (ref 8.7–10.4)
CHLORIDE: 98 mmol/L (ref 98–107)
CO2: 23.2 mmol/L (ref 20.0–31.0)
CREATININE: 5.83 mg/dL — ABNORMAL HIGH (ref 0.73–1.18)
EGFR CKD-EPI (2021) MALE: 11 mL/min/1.73m2 — ABNORMAL LOW (ref >=60)
GLUCOSE RANDOM: 200 mg/dL — ABNORMAL HIGH (ref 70–179)
POTASSIUM: 4.8 mmol/L (ref 3.4–4.8)
PROTEIN TOTAL: 6.8 g/dL (ref 5.7–8.2)
SODIUM: 139 mmol/L (ref 135–145)

## 2024-07-08 LAB — HIGH SENSITIVITY TROPONIN I - SINGLE: HIGH SENSITIVITY TROPONIN I: 39 ng/L (ref <=53)

## 2024-07-08 LAB — BLOOD GAS, VENOUS
BASE EXCESS VENOUS: 2.3 — ABNORMAL HIGH (ref -2.0–2.0)
HCO3 VENOUS: 28 mmol/L — ABNORMAL HIGH (ref 22–27)
O2 SATURATION VENOUS: 66.5 % (ref 40.0–85.0)
PCO2 VENOUS: 45 mmHg (ref 40–60)
PH VENOUS: 7.39 (ref 7.32–7.43)
PO2 VENOUS: 36 mmHg (ref 35–40)

## 2024-07-08 LAB — HIGH SENSITIVITY TROPONIN I - 2 HOUR SERIAL
HIGH SENSITIVITY TROPONIN - DELTA (0-2H): 0 ng/L (ref <=7)
HIGH-SENSITIVITY TROPONIN I - 2 HOUR: 39 ng/L (ref <=53)

## 2024-07-08 LAB — LIPASE: LIPASE: 29 U/L (ref 12–53)

## 2024-07-08 LAB — HIGH SENSITIVITY TROPONIN I - SERIAL: HIGH SENSITIVITY TROPONIN I: 39 ng/L (ref <=53)

## 2024-07-08 LAB — HIGH SENSITIVITY TROPONIN I - 6 HOUR SERIAL
HIGH SENSITIVITY TROPONIN - DELTA (2-6H): 6 ng/L (ref <=7)
HIGH-SENSITIVITY TROPONIN I - 6 HOUR: 33 ng/L (ref <=53)

## 2024-07-08 LAB — LACTATE, VENOUS, WHOLE BLOOD
LACTATE BLOOD VENOUS: 3.3 mmol/L — ABNORMAL HIGH (ref 0.5–1.8)
LACTATE BLOOD VENOUS: 2.2 mmol/L — ABNORMAL HIGH (ref 0.5–1.8)

## 2024-07-08 LAB — MAGNESIUM: MAGNESIUM: 2.1 mg/dL (ref 1.6–2.6)

## 2024-07-08 LAB — TSH: THYROID STIMULATING HORMONE: 1.572 u[IU]/mL (ref 0.550–4.780)

## 2024-07-08 LAB — PRO-BNP: PRO-BNP: 6328 pg/mL — ABNORMAL HIGH (ref <=300.0)

## 2024-07-08 MED ADMIN — sodium chloride 0.9% (NS) bolus 500 mL: 500 mL | INTRAVENOUS | @ 23:00:00 | Stop: 2024-07-08

## 2024-07-08 MED ADMIN — morphine 4 mg/mL injection 4 mg: 4 mg | INTRAVENOUS | @ 22:00:00 | Stop: 2024-07-08

## 2024-07-08 MED ADMIN — ondansetron (ZOFRAN) injection 4 mg: 4 mg | INTRAVENOUS | @ 21:00:00 | Stop: 2024-07-08

## 2024-07-08 MED ADMIN — iohexol (OMNIPAQUE) 350 mg iodine/mL solution 100 mL: 100 mL | INTRAVENOUS | @ 23:00:00 | Stop: 2024-07-08

## 2024-07-08 MED ADMIN — morphine 4 mg/mL injection 4 mg: 4 mg | INTRAVENOUS | Stop: 2024-07-08

## 2024-07-08 MED ADMIN — morphine 4 mg/mL injection 4 mg: 4 mg | INTRAVENOUS | @ 21:00:00 | Stop: 2024-07-08

## 2024-07-08 NOTE — ED Provider Notes (Signed)
 [facility]  Emergency Department Provider Note     ED Clinical Impression     Final diagnoses:   Generalized weakness (Primary)   Abdominal pain, unspecified abdominal location   Elevated brain natriuretic peptide (BNP) level   Syncope, unspecified syncope type   Umbilical hernia without obstruction and without gangrene      Impression, Medical Decision Making, ED Course     Impression: 59 y.o. male with past medical history of CHF (congestive heart failure) (CMS-HCC), COVID-19 (07/18/2019), Diabetes mellitus (CMS-HCC), Family history of malignant neoplasm of prostate (06/23/2015), Heart disease, Heart murmur, Hepatic cirrhosis    (CMS-HCC) (06/23/2015), Hypertension, Hypogonadism in male (02/12/2014), Kidney stone, Liver cirrhosis secondary to NASH (nonalcoholic steatohepatitis) (CMS-HCC), and Obesity who presents with lightheadedness as described below.     Diagnostic workup as below.     ED Course as of 07/10/24 1840   Tue Jul 08, 2024   1510 Upon initial evaluation, patient is reporting abdominal pain and lightheadedness. Initial vital signs are significant for hypertension. Will order laboratory studies, CT abdomen/pelvis, CT head, CXR, bedside cardiac POCUS, zofran , morphine , and reassess.   1516 CBC demonstrates normocytic anemia with hemoglobin 11.9, otherwise unremarkable.  Hemoglobin is mildly decreased over the last 5 days.   1516 EKG demonstrates possible ectopic atrial rhythm, PVCs, RBBB, rate 98, left axis deviation, without TWI or ST elevation/depression.  EKG appears unchanged from 05/25/2024.   1532 Had risk versus benefit discussion with patient regarding CT abdomen/pelvis with contrast.  Patient is comfortable undergoing CT abdomen/pelvis with contrast at this time.   1627 Mg, lipase, TSH WNL.  Initial troponin WNL.  BNP elevated at 6300. BNP appears improved from 5 months ago.   1631 Upon reassessment, patient continues to be in pain. Will redose morphine  and reassess.    1640 Bedside cardiac POCUS with normal-appearing systolic function, without obvious pericardial effusion, unable to obtain IVC views due to patient discomfort.   1712 Initial lactate elevated at 3.3. Will hold off on fluid bolus at this time given elevated BNP.  Will reassess lactate, pending CT read.   1728 XR Chest 1 view Portable  IMPRESSION:  No acute cardiopulmonary process. Stable exam.   1743 CT head WO contrast  IMPRESSION:  No acute intracranial abnormality.   1830 CMP demonstrates elevated anion gap of 18, elevated creatinine of 5.83, elevated glucose of 200, elevated alkaline phosphatase of 149.  Repeat troponin WNL.  Will add on VBG to evaluate for acidosis.   1835 On reassessment, patient continues to have pain.  Will redose morphine  and reassess.   1926 CT Abdomen Pelvis W IV Contrast  IMPRESSION:  *  No acute abnormality in the abdomen or pelvis.  *  Chronic and incidental findings as described in the report.   2006 Repeat lactate improved to 2.2.   2014 Upon reassessment, patient continues to have pain.  Will order GI cocktail in addition to protonix .   2116 Final CT abdomen/pelvis read:    Persistent small fat-containing umbilical hernia with adjacent inflammatory stranding.     Nonobstructing subcentimeter left renal stone, mildly increased in size since the prior exam. No adverse interval change otherwise. Additional incidental ancillary findings, as above.    Will discuss case with general surgery.   2124 6 hour troponin WNL.   2132 Discussed case with general surgery who recommends holding Eliquis  and will consult on the patient.  No plans for immediate surgical intervention.   2201 Discussed case with the hospitalist who will  see the patient.   2245 Admission orders placed.      MDM    Upon review of records, patient was discharged from the hospital on 06/24/2024 (after being hospitalized for management of splenic artery aneurysm).  Discharge summary also discusses management abdominal pain and umbilical hernia, ESRD on HD, immunosuppressed that is given history of cirrhosis and liver transplant, anticoagulation for history of DVT and paroxysmal atrial fibrillation, type 2 diabetes, HTN, and CHF. Most recent echo on file from 11/13/2022 demonstrates EF >55%.  Most recent CT abdomen/pelvis from 04/16/2024 demonstrates small fat-containing umbilical hernia, s/p liver transplantation with portal hypertension, and unchanged partially thrombosed splenic artery aneurysm at 2.6 cm.    Differential diagnosis includes but is not limited to vasovagal episode, orthostatic hypotension, anemia, hypoglycemia, electrolyte abnormality, seizure, arrhythmia, aortic dissection, pulmonary embolism, intracranial hemorrhage. Physical exam is significant for was dilated at 6 mm and diffuse abdominal tenderness throughout all quadrants. Initial vital signs are significant for hypertension.     Lower suspicion for anemia as the cause of his symptoms at this time given stable hemoglobin on CBC. Lower suspicion for hypoglycemia or electrolyte abnormality at this time given CMP findings. Lower suspicion for seizure at this time given lack of seizure history or witnessed seizure-like activity. Lower suspicion for aortic dissection at this time given stable vital signs, normal symmetric pulses on exam, and lack of mediastinal widening on CXR. Lower suspicion for pulmonary embolism at this time given lack of tachycardia, hypoxia, tachypnea, hemoptysis, or history of DVT, PE, malignancy, or hormone use. Lower suspicion for intracranial hemorrhage at this time given reassuring CT findings and reassuring neurological exam.      The cause of his symptoms are currently uncertain, however, may be attributed to vasovagal episode versus orthostatic hypotension versus arrhythmia. While in the ED, patient was given morphine , protonix , zofran , and GI cocktail with some improvement of symptoms. Case was discussed with general surgery who provided recommendations. Plan is for admission to medicine for further evaluation of his syncopal event and abdominal pain.     MDM Elements  Discussion of Management with other Physicians, QHP or Appropriate Source: see above  Independent Interpretation of Studies: see above  I have reviewed recent and relevant previous record, including: prior progress notes (discussing PMH and management)  Diagnostic tests considered but not performed: CT PE study (lower suspicion for pulmonary embolism at this time), CT dissection study (lower suspicion for aortic dissection at this time)  ____________________________________________    The case was discussed with the attending physician, Dr. Cleotilde, who is in agreement with the above assessment and plan.      History     Chief Complaint: Lightheadedness    HPI   Richard [name] is a 59 y.o. male with past medical history as stated below who presents with lightheadedness.     Patient reports a pre-syncopal episode while getting a cardiac ultrasound today in addition to subsequent intense diffuse abdominal pain. His presyncopal event was accompanied by shortness of breath, lightheadedness, and nausea. The patient underwent hemodialysis this morning but his session was stopped short, he is unsure why exactly. Upon arriving to the hospital, he began to feel lightheaded and presyncopal. No recent missed HD sessions. He does not make urine at baseline. History of similar diffuse abdominal pain, though he is unsure of the cause. He has an upcoming appointment with GI on Thursday. History of EGD/colonscopy, unsure of results. Takes eliquis . No history of PE. Patient denies fever, chills, cough,  hemoptysis, sore throat, vomiting, hematemesis, diarrhea, hematochezia, melena, constipation, dysuria, back pain, extremity pain or swelling, chest pain, headache, urinary/bowel incontinence or retention.      On chart review, the patient was called as a code blue at 1344 while having a PVL of his fistula performed. POC glucose showed 240. He remained AxO and was able to answer questions. He was then brought down to the ED for evaluation.    Past Medical History[1]    Past Surgical History[2]      Current Facility-Administered Medications:     acetaminophen  (TYLENOL ) tablet 650 mg, 650 mg, Oral, Q4H PRN, Akolbire, Dunstan Atiayarne, MD, 650 mg at 07/09/24 9188    apixaban  (ELIQUIS ) tablet 5 mg, 5 mg, Oral, BID, [place], Keva Doreen, [place], 5 mg at 07/10/24 9190    aspirin  chewable tablet 81 mg, 81 mg, Oral, Daily, Akolbire, Dunstan Atiayarne, MD, 81 mg at 07/10/24 9190    calcitriol  (ROCALTROL ) capsule 0.5 mcg, 0.5 mcg, Oral, 3x weekly in dialysis, Akolbire, Dunstan Atiayarne, MD, 0.5 mcg at 07/10/24 1003    cholecalciferol  (vitamin D3 25 mcg (1,000 units)) tablet 50 mcg, 50 mcg, Oral, Daily, Akolbire, Dunstan Atiayarne, MD, 50 mcg at 07/10/24 0809    cinacalcet (SENSIPAR) tablet 30 mg, 30 mg, Oral, Each time in dialysis, Manivannan, Surya, MD, 30 mg at 07/10/24 1003    cyclobenzaprine (FLEXERIL) tablet 10 mg, 10 mg, Oral, TID, [place], Keva Doreen, [place], 10 mg at 07/10/24 1449    dextrose  50 % in water (D50W) 50 % solution 12.5 g, 12.5 g, Intravenous, Q15 Min PRN, Akolbire, Dunstan Atiayarne, MD    gabapentin  (NEURONTIN ) capsule 300 mg, 300 mg, Oral, Nightly, Akolbire, Dunstan Atiayarne, MD, 300 mg at 07/09/24 2106    gentamicin -sodium citrate  lock solution in NS, 2.1 mL, Intra-cannular, Each time in dialysis PRN, Manivannan, Surya, MD, 2.1 mL at 07/10/24 1340    gentamicin -sodium citrate  lock solution in NS, 2.1 mL, Intra-cannular, Each time in dialysis PRN, Manivannan, Surya, MD, 2.1 mL at 07/10/24 1340    glucagon  injection 1 mg, 1 mg, Intramuscular, Once PRN, Akolbire, Dunstan Atiayarne, MD    glucose chewable tablet 16 g, 16 g, Oral, Q10 Min PRN, Akolbire, Dunstan Atiayarne, MD    heparin  (porcine) 1000 unit/mL injection 4,000 Units, 4,000 Units, Dialysis Circuit, Each time in dialysis, [place], Surya, MD, 4,000 Units at 07/10/24 0950    heparin  (porcine) 1000 unit/mL injection 4,000 Units, 4,000 Units, Dialysis Circuit, Each time in dialysis, [place], Surya, MD, 4,000 Units at 07/10/24 1148    HYDROmorphone  (DILAUDID ) injection Syrg 0.5 mg, 0.5 mg, Intravenous, Q4H PRN, Southwell, Keva Doreen, PA    HYDROmorphone  (DILAUDID ) tablet 2 mg, 2 mg, Oral, Q4H PRN, [place]well, Keva Doreen, [place]    insulin  glargine (LANTUS ) injection BASAL 30 Units, 30 Units, Subcutaneous, Nightly, Akolbire, Dunstan Atiayarne, MD, 30 Units at 07/09/24 2106    insulin  lispro (HumaLOG ) injection CORRECTIONAL 0-20 Units, 0-20 Units, Subcutaneous, ACHS, Janjua, Ejaz Arshad, DO, 1 Units at 07/09/24 2122    mycophenolate  (CELLCEPT ) capsule 250 mg, 250 mg, Oral, BID, Akolbire, Dunstan Atiayarne, MD, 250 mg at 07/10/24 9189    pantoprazole  (Protonix ) EC tablet 40 mg, 40 mg, Oral, Daily before breakfast, Akolbire, Dunstan Atiayarne, MD, 40 mg at 07/10/24 0809    prochlorperazine  (COMPAZINE ) injection 5 mg, 5 mg, Intravenous, Q6H PRN, Akolbire, Dunstan Atiayarne, MD    rifAXIMin  (XIFAXAN ) tablet 550 mg, 550 mg, Oral, BID, Akolbire, Dunstan Atiayarne, MD, 550 mg at 07/10/24 0810    sevelamer  (  RENVELA ) tablet 2,400 mg, 2,400 mg, Oral, 3xd Meals, Akolbire, Dunstan Atiayarne, MD, 2,400 mg at 07/10/24 1453    tacrolimus  (PROGRAF ) capsule 0.5 mg, 0.5 mg, Oral, BID, Akolbire, Dunstan Atiayarne, MD, 0.5 mg at 07/10/24 0810    ursodiol  (ACTIGALL ) capsule 600 mg, 600 mg, Oral, BID, Akolbire, Dunstan Atiayarne, MD, 600 mg at 07/10/24 9190    Allergies  Patient has no known allergies.    Family History  Family History[3]    Social History  Short Social History[4]     Physical Exam     VITAL SIGNS:      Vitals:    07/10/24 1230 07/10/24 1300 07/10/24 1330 07/10/24 1348   BP: (!) 154/96 (!) 153/87 (!) 159/71 (!) 160/92   Pulse: 77 75 76 72   Resp:  17 18 18    Temp:    36.4 ??C (97.5 ??F)   TempSrc:    Oral   SpO2:       Weight:       Height:           Vital signs reviewed. Constitutional: Alert and oriented. No acute distress.  HEENT: Normocephalic and atraumatic. Conjunctivae normal. EOMI, PERRL. Pupils dilated at 6 mm bilaterally. Moist mucous membranes. No oropharyngeal erythema or edema. Neck supple without meningismus, non-tender, no significant cervical lymphadenopathy, no masses.   Cardiovascular: Regular rate, regular rhythm. No murmurs, no rubs, no gallops. Normal and symmetric distal pulses. Brisk capillary refill.   Respiratory: Normal respiratory effort. No wheezes or crackles.   Gastrointestinal: Soft, non-distended. Diffuse abdominal tenderness throughout all quadrants. No rebound tenderness or guarding. Normal bowel sounds. No palpable organomegaly or masses.  Musculoskeletal: Non-tender with normal range of motion in all extremities. No lower extremity swelling. No bony deformity.   Neurologic: Cranial nerves grossly intact. No gross focal neurologic deficits appreciated. Moving all extremities equally. Motor and sensory function intact.   Skin: warm, dry. No rash. No abrasion or laceration.   Psychiatric: Mood and affect are normal. Speech and behavior are normal.     Radiology     CTA Abdomen Pelvis W Wo Contrast   Final Result   1.  No evidence of mesenteric ischemia. No acute intra-abdominal pathology.   2.  Status post liver transplant with sequelae of portal hypertension and large portosystemic shunt in the upper abdomen.   3.  Unchanged fat-containing umbilical/periumbilical hernia with associated stranding, likely reactive in nature.      PVL Hemodialysis Graft/Fistula Duplex   Final Result      Echocardiogram W Colorflow Spectral Doppler With Contrast   Final Result      CT Abdomen Pelvis W IV Contrast   Final Result         Persistent small fat-containing umbilical hernia with adjacent inflammatory stranding.      Nonobstructing subcentimeter left renal stone, mildly increased in size since the prior exam. No adverse interval change otherwise. Additional incidental ancillary findings, as above.          I, Adine Caraway, MD, have personally reviewed the images and concur with the resident preliminary report above. This report now represents the final report for this patient.               CT head WO contrast   Final Result   No acute intracranial abnormality.            XR Chest 1 view Portable   Final Result   No acute cardiopulmonary process. Stable exam.  ED POCUS             Pertinent labs & imaging results that were available during my care of the patient were independently interpreted by me and considered in my medical decision making (see chart for details).    Portions of this record have been created using Scientist, clinical (histocompatibility and immunogenetics). Dictation errors have been sought, but may not have been identified and corrected.      Documentation assistance was provided by Lamarr Edison, Scribe, on July 08, 2024 at 3:29 PM for Gladies Brewster, MD.    July 08, 2024 3:53 PM. Documentation assistance provided by the scribe. I was present during the time the encounter was recorded. The information recorded by the scribe was done at my direction and has been reviewed and validated by me.           [1]   Past Medical History:  Diagnosis Date    CHF (congestive heart failure) (CMS-HCC)     COVID-19 07/18/2019    Diabetes mellitus (CMS-HCC)     Family history of malignant neoplasm of prostate 06/23/2015    Heart disease     Heart murmur     Hepatic cirrhosis    (CMS-HCC) 06/23/2015    Overview:  Secondary to NASH; followed by Dr. Lindaann, GI.  Last Assessment & Plan:  Relevant Hx: Course: Daily Update: Today's Plan:     Hypertension     Hypogonadism in male 02/12/2014    Kidney stone     Liver cirrhosis secondary to NASH (nonalcoholic steatohepatitis) (CMS-HCC)     s/p liver transplant 2013    Obesity    [2]   Past Surgical History:  Procedure Laterality Date    CHOLECYSTECTOMY      liver transpant      LIVER TRANSPLANTATION  06/27/2011    PR ANASTOMOSIS,AV,ANY SITE Right 02/20/2024    Procedure: ARTERIOVENOUS ANASTOMOSIS, OPEN; DIRECT, ANY SITE, UPPER EXTREMITY;  Surgeon: Marchelle Kitchens, MD;  [facility]: [facility];  Service: General Surgery    PR AV ANAST,UP ARM BASILIC VEIN TRANSPOSIT Right 03/31/2024    Procedure: ARTERIOVENOUS ANASTOMOSIS, OPEN; BY UPPER ARM BASILIC VEIN TRANSPOSITION;  Surgeon: Marchelle Kitchens, MD;  Location: [facility] OR [facility];  Service: General Surgery    PR AV FIST REVISE GRFT,W THROMBECTOMY Right 01/15/2023    Procedure: REVISION, ARTERIOVENOUS FISTULA W/ THROMBECTOMY, AUTOGENOUS OR NONAUTOGENOUS DIALYSIS GRAFT (SEP. PROC), LOWER EXTREMITY;  Surgeon: Marchelle Kitchens, MD;  Location: [facility] OR [facility];  Service: General Surgery    PR COLONOSCOPY FLX DX W/COLLJ SPEC WHEN PFRMD N/A 01/24/2024    Procedure: COLONOSCOPY, FLEXIBLE, PROXIMAL TO SPLENIC FLEXURE; DIAGNOSTIC, W/WO COLLECTION SPECIMEN BY BRUSH OR WASH;  Surgeon: Erick Prentice HERO, MD;  [facility];  Service: Gastroenterology    PR COLONOSCOPY W/BIOPSY SINGLE/MULTIPLE N/A 08/13/2018    Procedure: COLONOSCOPY, FLEXIBLE, PROXIMAL TO SPLENIC FLEXURE; WITH BIOPSY, SINGLE OR MULTIPLE;  Surgeon: Thedora Alm Plain, MD;  [facility]: [facility];  Service: Gastroenterology    PR COLSC FLX W/RMVL OF TUMOR POLYP LESION SNARE TQ N/A 08/13/2018    Procedure: COLONOSCOPY FLEX; W/REMOV TUMOR/LES BY SNARE;  Surgeon: Thedora Alm Plain, MD;  [facility]: [facility];  Service: Gastroenterology    PR CREAT AV FISTULA,NON-AUTOGENOUS GRAFT Right 11/29/2022    Procedure: CREATE AV FISTULA (SEPARATE PROC); NONAUTOGENOUS GRAFT (EG, BIOLOGICAL COLLAGEN, THERMOPLASTIC GRAFT), UPPER EXTREMITY;  Surgeon: Marchelle Kitchens, MD;  Location: [facility] OR [facility];  Service: General Surgery  PR UPPER GI ENDOSCOPY,BIOPSY N/A 07/13/2015    Procedure: UGI ENDOSCOPY; WITH BIOPSY, SINGLE OR MULTIPLE;  Surgeon: Elspeth Jerilynn Reek, MD;  Location: [facility]; Service: Gastroenterology    PR UPPER GI ENDOSCOPY,BIOPSY N/A 02/13/2023    Procedure: UGI ENDOSCOPY; WITH BIOPSY, SINGLE OR MULTIPLE;  Surgeon: Minnie Krystal Claude, MD;  [facility]: [facility];  Service: Gastroenterology    PR UPPER GI ENDOSCOPY,DIAGNOSIS N/A 01/24/2024    Procedure: UGI ENDO, INCLUDE ESOPHAGUS, STOMACH, & DUODENUM &/OR JEJUNUM; DX W/WO COLLECTION SPECIMN, BY BRUSH OR WASH;  Surgeon: Erick Prentice HERO, MD;  Location: GI [facility];  Service: Gastroenterology   [3]   Family History  Problem Relation Age of Onset    Diabetes Father     Diabetes Paternal Grandfather     Liver disease Maternal Uncle     Cancer Maternal Grandmother     Melanoma Neg Hx     Basal cell carcinoma Neg Hx     Squamous cell carcinoma Neg Hx     Kidney disease Neg Hx    [4]   Social History  Tobacco Use    Smoking status: Never     Passive exposure: Past    Smokeless tobacco: Never   Vaping Use    Vaping status: Never Used   Substance Use Topics    Alcohol use: Never    Drug use: Never        Jesper Stirewalt A, MD  Resident  07/10/24 [phone]

## 2024-07-08 NOTE — ED Triage Note (Signed)
 Code Blue from imaging  Pt here for an cardiac ultrasound. Prior to ultrasound, tech sts pt c/o not having generalized weakness and near syncope. Pt also reports having abd pain.   Pt had dialysis this morning. Sts he have these symptoms sometimes after having dialysis.

## 2024-07-08 NOTE — ED Notes (Signed)
 Bed: 07  Expected date:   Expected time:   Means of arrival:   Comments:

## 2024-07-08 NOTE — H&P (Signed)
 Richard [name]   History and Physical       Assessment and Plan     Richard [name] is a 59 y.o. male who is presenting to [facility] with Pre-syncope, in the setting of the following pertinent/contributing co-morbidities: liver cirrhosis s/p liver transplant, ESRD, CAD, HTN, HFpEF.      # Pre-syncope   Illness that poses a threat to life or bodily function without appropriate treatment  The patient presented with dizziness and a near syncopal episode.  EKG in the ED showed an ectopic atrial rhythm with prolonged QTc, troponins negative. Last ECHO in 10/2022 was fairly unremarkable with no significant valvular disease. Patient likely had vasovagal episode but is high risk based on hx.  Obtain orthostatics  Obtain Echo  Telemetry monitoring  Fall precautions    Secondary/Additional Active Problems:    # Abdominal pain possibly incarcerated umbilical hernia  The patient had severe central abdominal pain in the ED with no significant relief following IV morphine  4 mg. CT abd/pelvis showing fat containing umbilical hernia with adjacent inflammatory stranding. ED consulted general surgery and patient will be evaluated in the AM.  NPO after midnight  Hold apixaban  in case of surgery  IV Dilaudid  0.5mg , oxycodone  po prn  IV Compazine  prn  Monitor electrolytes  Follow up general surgery consult recommendations     # Type 2 diabetes mellitus with hyperglycemia   Last A1c 10.6% on 05/25/2024.  Accucheks q4h while NPO  Insulin  sliding scale  Lantus  30u at bedtime (patient takes 70u at home)  Hypoglycemia protocol     # Ectopic atrial rhythm   # Bifascicular block   # Prolonged QTc  New ectopic atrial rhythm with possible premature ventricular beats or fusion complexes noted on ECG. QT interval was lengthened to 506 ms (QTC 645 ms, QTC Fredericia 596 ms). There was also right bundle branch block and left anterior fascicular block, consistent with bifascicular block.  Telemetry monitoring.  Avoid QT prolonging medications  Serial EKG     # Anemia of chronic kidney disease   Hemoglobin 11.9 g/dL, hematocrit 63.7%, and RBC 4.21 10*12/L on 07/08/2024, consistent with chronic anemia likely secondary to ESRD.  Monitor CBC.     # Lymphocytopenia   Absolute lymphocytes 0.5 10*9/L on 07/08/2024. Patient is on immunosuppressants (tacrolimus , mycophenolate  mofetil) post-liver transplant.  Monitor CBC.     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 Liver biopsy 01/11/2024 showed no acute rejection but sinusoidal dilatation and iron deposition. Patient is on tacrolimus , mycophenolate  mofetil, rifaximin , and ursodeoxycholate. He does not take lactulose .  Continue tacrolimus  0.5mg  bid  Continue mycophenolate  mofetil 250mg  po bid   Continue rifaximin  po bid  Continue ursodeoxycholate po bid.   Outpatient hepatology follow-up.     # End-stage renal disease on hemodialysis TTS  Dialysis staff have had difficulty cannulating his fistula and patient has been dialyzed via tunneled left IJ catheter for last few months pending duplex of fistula.   Obtain right arm PVL to assess fistula  Continue hemodialysis routinely.  Continue calcitriol , sevelamer .  Consult nephrology for resumption of dialysis if still in-house on Thursday     # Essential hypertension   Patient no longer on antihypertensives at home  Monitor blood pressure.     # Chronic compensated  diastolic heart failure with preserved ejection fraction   History of chronic diastolic heart failure with preserved ejection fraction (LVEF >55% on 11/17/2022,).  Continue routine dialysis for fluid removal  Ensure adequate BP control  Daily weight  Low salt diet  Outpatient follow-up.     # Coronary artery disease   Continue aspirin  po daily.     # Aneurysm of splenic artery, status post stenting   History of 2.6 cm splenic artery aneurysm s/p stenting on 06/23/2024. Follow-up CT recommended in 1 month. Patient is on aspirin .  Continue aspirin .   Follow-up imaging as recommended.     # Secondary hypercoagulable state due to history of deep venous thrombosis and atrial fibrillation   History of deep vein thrombosis (left internal jugular vein DVT 01/28/2024) and paroxysmal atrial fibrillation, managed with apixaban .  Hold apixaban  for possible surgery.     # Gastroesophageal reflux disease   History of GERD, with severe reflux.  Continue pantoprazole  po daily.     # Obesity class 2  History of obesity, current BMI 36.61 kg/m2. On tirzepatide  at home.  Continue weight management strategies.  Resume tirzepatide  on discharge.     # Diabetic polyneuropathy   Continue gabapentin  po at bedtime.     # First degree atrioventricular block   Chronic finding on ECGs, most recently 07/08/2024.  Continue telemetry monitoring.       Prophylaxis  -SCD    Diet  -Nutrition Therapy Renal    Code Status / HCDM  -Full Code,   -  HCDM (patient stated preference): Zonia Olam JASMINE Sister - [phone]    Anticipated Medically Ready for Discharge: Anticipated Tomorrow    Significant Comorbid Conditions:    -Chronic kidney disease POA requiring further investigation, treatment, or monitoring    Issues Impacting Complexity of Management:  -Parenteral controlled medications: IV Dilaudid     Medical Decision Making: Discussed the patient's management and/or test interpretation with ED Provider as summarized within this note    I personally spent greater than 75 minutes face-to-face and non-face-to-face in the care of this patient, which includes all pre, intra, and post visit time on the date of service.  All documented time was specific to the E/M visit and does not include any procedures that may have been performed.    HPI      Richard [name] is a 59 y.o. male who is presenting to [facility] with Pre-syncope.    History of Present Illness  The patient is a 59 year old man with a complex medical history including CHF (HFpEF), HTN, T2DM, hepatic cirrhosis secondary to NASH s/p liver transplant in 2013, ESRD and coronary artery disease.     He presented to the ED today after experiencing dizziness with a near-syncopal episode while in the hospital for a right arm fistula ultrasound. Patient was standing when it happened and was caught by staff hence did not fall. He denied LOC and was able to recount events leading up to presentation. There was no chest pain, SOB, profuse sweating, lower extremity swelling, vomiting, diarrhea, fever and chills. He had completed dialysis without issue earlier in the day.    He has been on dialysis for about a year and half but over the past two weeks he has developed significant discomfort during the last hour of his dialysis sessions, with stomach pain and feeling very ill. Dialysis staff have had to stop fluid removal early.    In the ED, he complained of severe, sharp mid-abdominal pain rated 8/10,  decreasing to about 6/10 with IV Morphine  but with inconsistent relief. Pain is in the middle of his abdomen and radiates to his back.     ER course was notable for CT abd/pelvis showing fat containing umbilical hernia with adjacent inflammatory stranding. ECG also showed an unusual P axis, possible ectopic atrial rhythm with premature ventricular beats or fusion complexes, right bundle branch block, left anterior fascicular block, and bifascicular block.      Med Rec Confidence   I reviewed the Medication List. The current list is Accurate    Physical Exam   Pulse:  [67-83] 68  SpO2 Pulse:  [76-79] 76  Resp:  [17-21] 21  BP: (123-149)/(67-90) 123/67  SpO2:  [93 %-100 %] 100 %  There is no height or weight on file to calculate BMI.  General appearance: alert, cooperative, no distress, and moderately obese  Lungs: clear to auscultation bilaterally  Heart: regular rate and rhythm, S1, S2 normal, no murmur, click, rub or gallop  Abdomen: soft, diffuse tenderness; bowel sounds normal; no masses,  no organomegaly  Extremities: extremities normal, atraumatic, no cyanosis or edema  Neurologic: Grossly normal

## 2024-07-08 NOTE — Progress Notes (Unsigned)
 07/10/24 clinic    Referred by Cherie Somera for periumbilical pain and umbilical hernia    Height - 5'10  Weight- 257  BMI- 36.88    PMH  history of liver transplant for NASH cirrhosis, end-stage renal disease (ESRD) on hemodialysis, heart failure with preserved ejection fraction, coronary artery disease, hypertension, type 2 diabetes mellitus, and prior deep venous thrombosis, who was admitted for management of a splenic artery aneurysm 12/25    [facility]  Liver transplant 2013  Cath exchange    Tirzepatide   Eliquis   Aspirin   insulin 

## 2024-07-08 NOTE — Procedures (Signed)
 ULTRASOUND PIV PROCEDURE NOTE    Indications:   Poor venous access.    Ultrasound guidance was necessary to obtain access.     Procedure Details:  Identity of the patient was confirmed via name, medical record number and date of birth. The availability of the correct equipment was verified.    The vein was identified and measured for ultrasound catheter insertion.       Vein measurement (without tourniquet):   0.24 cm   A(n) 20 gauge 1.88 catheter was selected based on the recommendations below:    [facility] Catheter/Vein Ratio Guidelines   Chart to determine PIV catheter size/length to use based on vein diameter and depth   Catheter Gauge Size (g)  22g 20g 18g   Catheter length (inches)  1.75 1.75 1.75   Catheter diameter measurement (mm) 0.9 mm 1.1 mm 1.3 mm          Minimum required vein diameter       Sonosite (cm)  0.18 cm 0.22 cm 0.26 cm                  (mm)  1.8 mm  2.2 mm  2.6 mm   Maximum vein depth  1.25 cm 1.25 cm 1.25 cm          The field was prepared with necessary supplies and equipment.  Probe cover and sterile gel utilized. Insertion site was prepped with chlorhexidineand allowed to dry.  The catheter extension was primed with normal saline.  The US  PIV was placed in the L Forearm with 1attempt(s). See LDA for additional details.    Catheter aspirated, 3 mL blood return present. The catheter was then flushed with 10 mL of normal saline. Insertion site cleansed, and dressing applied per manufacturer guidelines. The catheter was inserted without difficulty by Adine Negri, RN.    Thank you,     Adine Negri, RN    Ultrasound Resource Nurse    Workup / Procedure Time:  30 minutes

## 2024-07-09 LAB — TACROLIMUS LEVEL, TROUGH: TACROLIMUS, TROUGH: 3.9 ng/mL — ABNORMAL LOW (ref 5.0–15.0)

## 2024-07-09 LAB — SEDIMENTATION RATE: ERYTHROCYTE SEDIMENTATION RATE: 22 mm/h — ABNORMAL HIGH (ref 0–20)

## 2024-07-09 MED ADMIN — aspirin chewable tablet 81 mg: 81 mg | ORAL | @ 13:00:00

## 2024-07-09 MED ADMIN — tacrolimus (PROGRAF) capsule 0.5 mg: .5 mg | ORAL | @ 06:00:00

## 2024-07-09 MED ADMIN — tacrolimus (PROGRAF) capsule 0.5 mg: .5 mg | ORAL | @ 13:00:00

## 2024-07-09 MED ADMIN — sevelamer (RENVELA) tablet 2,400 mg: 2400 mg | ORAL | @ 13:00:00

## 2024-07-09 MED ADMIN — sevelamer (RENVELA) tablet 2,400 mg: 2400 mg | ORAL | @ 18:00:00

## 2024-07-09 MED ADMIN — sevelamer (RENVELA) tablet 2,400 mg: 2400 mg | ORAL | @ 23:00:00

## 2024-07-09 MED ADMIN — sevelamer (RENVELA) tablet 2,400 mg: 2400 mg | ORAL | @ 05:00:00

## 2024-07-09 MED ADMIN — acetaminophen (TYLENOL) tablet 650 mg: 650 mg | ORAL | @ 13:00:00

## 2024-07-09 MED ADMIN — cholecalciferol (vitamin D3 25 mcg (1,000 units)) tablet 50 mcg: 50 ug | ORAL | @ 13:00:00

## 2024-07-09 MED ADMIN — rifAXIMin (XIFAXAN) tablet 550 mg: 550 mg | ORAL | @ 13:00:00 | Stop: 2024-07-13

## 2024-07-09 MED ADMIN — rifAXIMin (XIFAXAN) tablet 550 mg: 550 mg | ORAL | @ 05:00:00 | Stop: 2024-07-13

## 2024-07-09 MED ADMIN — ursodiol (ACTIGALL) capsule 600 mg: 600 mg | ORAL | @ 13:00:00

## 2024-07-09 MED ADMIN — ursodiol (ACTIGALL) capsule 600 mg: 600 mg | ORAL | @ 05:00:00

## 2024-07-09 MED ADMIN — gabapentin (NEURONTIN) capsule 300 mg: 300 mg | ORAL | @ 06:00:00

## 2024-07-09 MED ADMIN — HYDROmorphone (DILAUDID) tablet 1 mg: 1 mg | ORAL | @ 18:00:00 | Stop: 2024-07-16

## 2024-07-09 MED ADMIN — mycophenolate (CELLCEPT) capsule 250 mg: 250 mg | ORAL | @ 13:00:00

## 2024-07-09 MED ADMIN — mycophenolate (CELLCEPT) capsule 250 mg: 250 mg | ORAL | @ 05:00:00

## 2024-07-09 MED ADMIN — HYDROmorphone (DILAUDID) injection Syrg 0.5 mg: .5 mg | INTRAVENOUS | @ 23:00:00 | Stop: 2024-07-15

## 2024-07-09 MED ADMIN — cyclobenzaprine (FLEXERIL) tablet 10 mg: 10 mg | ORAL | @ 20:00:00

## 2024-07-09 MED ADMIN — iohexol (OMNIPAQUE) 350 mg iodine/mL solution 100 mL: 100 mL | INTRAVENOUS | @ 23:00:00 | Stop: 2024-07-09

## 2024-07-09 MED ADMIN — pantoprazole (Protonix) EC tablet 40 mg: 40 mg | ORAL | @ 13:00:00

## 2024-07-09 MED ADMIN — apixaban (ELIQUIS) tablet 5 mg: 5 mg | ORAL | @ 18:00:00

## 2024-07-09 MED ADMIN — HYDROmorphone (DILAUDID) injection Syrg 0.5 mg: .5 mg | INTRAVENOUS | @ 13:00:00 | Stop: 2024-07-09

## 2024-07-09 MED ADMIN — HYDROmorphone (DILAUDID) injection Syrg 0.5 mg: .5 mg | INTRAVENOUS | @ 04:00:00 | Stop: 2024-07-15

## 2024-07-09 MED ADMIN — HYDROmorphone (DILAUDID) injection Syrg 0.5 mg: .5 mg | INTRAVENOUS | @ 09:00:00 | Stop: 2024-07-09

## 2024-07-09 MED ADMIN — insulin glargine (LANTUS) injection BASAL 30 Units: 30 [IU] | SUBCUTANEOUS | @ 05:00:00

## 2024-07-09 MED ADMIN — pantoprazole (Protonix) injection 40 mg: 40 mg | INTRAVENOUS | @ 02:00:00 | Stop: 2024-07-08

## 2024-07-09 MED ADMIN — perflutren lipid microspheres (DEFINITY) injection 3 mL: 3 mL | INTRAVENOUS | @ 12:00:00 | Stop: 2024-07-09

## 2024-07-09 MED ADMIN — morphine injection 2 mg: 2 mg | INTRAVENOUS | @ 02:00:00 | Stop: 2024-07-08

## 2024-07-09 NOTE — Plan of Care (Addendum)
 Pt A&O x4 with c/o pain and discomfort that medication helps when given as ordered. Pt refusing to wear tele box, MD's all notified. All care explained, all questions addressed. Call bell in reach.  Shift Summary  Pain was reduced from 7/10 to 0/10 after administration of acetaminophen  and HYDROmorphone , and pain relief was sustained for the remainder of the shift.   No syncopal symptoms or changes in consciousness were documented, and orientation remained intact.   Oral intake and meal consumption were adequate, with increased fluid intake and full meal completion.   Multiple medications were administered, including pain control and anticoagulation, with no adverse events noted.   Overall, comfort and safety interventions were maintained, and the patient remained alert and cooperative.     Optimal Comfort and Wellbeing: Pain was initially reported as 7/10 with acute discomfort, but after administration of acetaminophen  and HYDROmorphone , pain decreased to 0/10 and remained at that level for the rest of the shift; mood was anxious but cooperative, and sleep/rest interventions were maintained throughout the day.     Absence of Syncopal Symptoms: No syncopal episodes were documented during the shift, and level of consciousness remained alert and oriented throughout.     Optimal Pain Control and Function: Pain in the morning was described as acute and intermittent, but after administration of pain medications, pain scores dropped to 0 and remained unchanged, allowing for improved comfort and function.     Improved Oral Intake: Oral intake increased from 350 mL in the morning to 400 mL in the afternoon, and 100% of meals were consumed at midday.     Optimal Pain Control and Function: Pain was effectively managed with PRN acetaminophen  and HYDROmorphone , resulting in sustained pain relief throughout the shift.

## 2024-07-09 NOTE — Progress Notes (Signed)
 Hospital Medicine Daily Progress Note    Assessment/Plan:    Principal Problem:    Pre-syncope        Wound 05/20/24 chg gel and transparent dressing in place before PACU arrival (Active)   Dressing Status      Edges lifted 05/20/24 1730   Site Assessment Bleeding 05/20/24 1730   Dressing Other (Comment) 05/20/24 1720       Wound 06/23/24 Surgical Catheter Entry Leg Anterior;Proximal;Right;Upper (Active)   Dressing Status      Clean;Dry;Intact/not removed 06/23/24 2200   Peri-wound Assessment      Soft 06/23/24 1700   Dressing Transparent film dressing;Dry gauze 06/23/24 2200   Dressing Changed Reinforced 06/23/24 2200           Richard [name] is a 59 y.o. male that presented to [facility] with Pre-syncope.    # Pre-syncope   Illness that poses a threat to life or bodily function without appropriate treatment  The patient presented with dizziness and a near syncopal episode.  EKG in the ED showed an ectopic atrial rhythm with prolonged QTc, troponins negative. Last ECHO in 10/2022 was fairly unremarkable with no significant valvular disease. Patient likely had vasovagal episode vs orthostasis following HD. He reports it usually occurs after HD  and self resolves over the following day. Orthostatics negative this AM. ECHO with G1DD, otherwise unremarkable. Refusing tele   -CTM   -Fall precautions     Secondary/Additional Active Problems:     # Abdominal pain   possibly incarcerated umbilical hernia  The patient had severe central abdominal pain in the ED with no significant relief following IV morphine  4 mg. CT abd/pelvis showing fat containing umbilical hernia with adjacent inflammatory stranding. This is not however the location of the pt's pain, which is above the umbilicus, in the epigastrium and RUQ, ending just above the umbilicus.   Surgery was consulted, no surgical needs. Appreciate consult.   This is actually a chronic issue for pt, he reports 2 weeks of sx, but chart review shows this is an ongoing issue. It starts at the end of HD, and self resolves over the next day. It also occurs with walking and resolves with rest. He has not been evaluated for mesenteric ischemia, which he has significant risk for. Per last admission note, concern he was not taking eliquis . Was admitted for aneurism and thrombosis of splenic artery. This is unchanged on imaging.   Discussed with nephrology, pt's outpatient provider currently on service. His abdo complaints after HD are longstanding, thought to be due to large fluid shifts causing abdominal cramping. Pt somewhat reluctant to consider this, says it's not cramps, it's real pain. I explained that cramping can cause real and severe pain.   -IV Dilaudid  0.5mg , PO dilaudid  1 mg PRN   -IV Compazine  prn  -Monitor electrolytes  -trial cyclobenzaprine  -obtain mesenteric CTA, okay'd by nephrology and pt.       # Type 2 diabetes mellitus with hyperglycemia   Last A1c 10.6% on 05/25/2024.  Insulin  sliding scale  Lantus  30u at bedtime (patient takes 70u at home)  Hypoglycemia protocol     # Ectopic atrial rhythm   # Bifascicular block   # Prolonged QTc  New ectopic atrial rhythm with possible premature ventricular beats or fusion complexes noted on ECG. QT interval was lengthened to 506 ms (QTC 645 ms, QTC Fredericia 596 ms). There was also right bundle branch block and left anterior fascicular block, consistent with bifascicular block.  Telemetry monitoring.  Avoid QT prolonging medications  Serial EKG     # Anemia of chronic kidney disease   Hemoglobin 11.9 g/dL, hematocrit 63.7%, and RBC 4.21 10*12/L on 07/08/2024, consistent with chronic anemia likely secondary to ESRD.  Monitor CBC.     # Lymphocytopenia   Absolute lymphocytes 0.5 10*9/L on 07/08/2024. Patient is on immunosuppressants (tacrolimus , mycophenolate  mofetil) post-liver transplant.  Monitor CBC.     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 Liver biopsy 01/11/2024 showed no acute rejection but sinusoidal dilatation and iron deposition. Patient is on tacrolimus , mycophenolate  mofetil, rifaximin , and ursodeoxycholate. He does not take lactulose .  Continue tacrolimus  0.5mg  bid  Continue mycophenolate  mofetil 250mg  po bid   Continue rifaximin  po bid  Continue ursodeoxycholate po bid.   Outpatient hepatology follow-up.     # End-stage renal disease on hemodialysis TTS  Dialysis staff have had difficulty cannulating his fistula and patient has been dialyzed via tunneled left IJ catheter for last few months pending duplex of fistula.   Obtain right arm PVL to assess fistula  Continue hemodialysis routinely.  Continue calcitriol , sevelamer .  Consult nephrology for resumption of dialysis if still in-house on Thursday     # Essential hypertension   Patient no longer on antihypertensives at home  Monitor blood pressure.     # Chronic compensated diastolic heart failure with preserved ejection fraction   History of chronic diastolic heart failure with preserved ejection fraction (LVEF >55% on 11/17/2022,).  Continue routine dialysis for fluid removal  Ensure adequate BP control  Daily weight  Low salt diet  Outpatient follow-up.     # Coronary artery disease   Continue aspirin  po daily.     # Aneurysm of splenic artery, status post stenting   History of 2.6 cm splenic artery aneurysm s/p stenting on 06/23/2024. Follow-up CT recommended in 1 month. Patient is on aspirin .  Continue aspirin .   Follow-up imaging as recommended.     # Secondary hypercoagulable state due to history of deep venous thrombosis and atrial fibrillation   History of deep vein thrombosis (left internal jugular vein DVT 01/28/2024) and paroxysmal atrial fibrillation, managed with apixaban .  -resume apixaban       # Gastroesophageal reflux disease   History of GERD, with severe reflux.  Continue pantoprazole  po daily.     # Obesity class 2  History of obesity, current BMI 36.61 kg/m2. On tirzepatide  at home.  Continue weight management strategies.  Resume tirzepatide  on discharge.     # Diabetic polyneuropathy   Continue gabapentin  po at bedtime.     # First degree atrioventricular block   Chronic finding on ECGs, most recently 07/08/2024.  Continue telemetry monitoring.        Prophylaxis  -SCD     Diet  -Nutrition Therapy Renal     Code Status / HCDM  -Full Code,   -  HCDM (patient stated preference): [name] - Sister - [phone]     Anticipated Medically Ready for Discharge: Anticipated Tomorrow    I personally spent 35 minutes face-to-face and non-face-to-face in the care of this patient, which includes all pre, intra, and post visit time on the date of service.  All documented time was specific to the E/M visit and does not include any procedures  that may have been performed.    ___________________________________________________________________    Subjective:  Very concerned about pain, starts in the last hour of HD, then gradually improves over the next day. Usually resolved by the next HD session. Unaffected by eating. Also occurs while walking sometimes, resolves with rest. Lightheadedness is also related to HD sessions.      General ROS: negative     Labs/Studies:  Labs and Studies from the last 24hrs per EMR and Reviewed    Objective:  Temp:  [36.6 ??C (97.9 ??F)-36.9 ??C (98.4 ??F)] 36.6 ??C (97.9 ??F)  Pulse:  [67-83] 72  SpO2 Pulse:  [76-79] 76  Resp:  [17-21] 20  BP: (122-149)/(67-90) 126/81  SpO2:  [93 %-100 %] 97 %    GEN: NAD, lying in bed, tearful at times  EYES: EOMI  ENT: MMM  CV: RRR  PULM: CTA B  ABD: soft, TTP epigastrium and abdo midline until about 2 cm above the umbilicus, and slight TTP RUQ,. +BS  EXT: No edema

## 2024-07-09 NOTE — Consults (Signed)
 [facility] CONSULT NOTE    Patient Name: Richard [name]  Medical Record Number: [id number]  Referring Provider:  Lanette Jacklyn Boy, MD        Assessment and Plan: Richard [name] is a 59 y.o. 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 CT abd/pelvis revealed a persistent small fat-containing umbilical hernia with adjacent inflammatory stranding. On exam, no palpable hernia was felt. It is unclear if his abdominal symptoms are related to hernia. We are not currently recommending surgical intervention given small size of hernia, lack of bowel involvement,  and also his comorbid conditions increase his risk of surgical complications.     Recommendations  -No surgical intervention this admission  -Okay to resume Eliquis   -Rest of care per primary    History of Present Illness: Richard [name] is a 59 y.o. 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 General surgery is being consulted due to a fat-containing umbilical hernia. Patient reports that his periumbilical abdominal pain has been ongoing for some time now, usually occurring at the end of a dialysis treatment- with intermittent episodes of severity that is worsened with walking and other physical activity. Reports associated emesis with pain. Denies seeing a bulge in area. Reports he has had several BM a day that he describes as soft serve. Denies fever/chills.  Has also been having problems related to dialysis access. He has a right upper arm transposed fistula in place. The fistula has a brisk thrill and is easily palpated. However, there have been repeated problems with cannulation. He was supposed to obtain a PVL evaluation of the existing access yesterday. He experienced the syncopal episode as he was checking in for the ultrasound.     Reports SOB with activity. Denies chest pain.     Of note, he is currently on Eliquis .    Past Medical History:  Past Medical History[1]    Past Surgical History:   Past Surgical History[2]    Social History:  Lives in [place] by himself.    Tobacco use: denies   Alcohol use: denies  Drug use: denies    Family History:   The patient's family history includes Cancer in his maternal grandmother; Diabetes in his father and paternal grandfather; Liver disease in his maternal uncle..    Review of systems:   A 12 system review of systems was negative except as noted in HPI    Medication:  Current Medications[3]    Allergies:  Patient has no known allergies.    BP 126/81  - Pulse 72  - Temp 36.6 ??C (97.9 ??F) (Oral)  - Resp 20  - Ht 177.8 cm (5' 10)  - Wt (!) 115.4 kg (254 lb 8 oz)  - SpO2 97%  - BMI 36.52 kg/m??  Body mass index is 36.52 kg/m??.    Physical exam:  Consitutional: NAD  Eyes: anicteric  CV: Regular rate  Resp: Unlabored breathing on RA  Abd: Soft, tender to palpation of periumbilical region, no hernia felt  Skin: Warm and dry  Psychiatric: Answering questions appropriately   Extremities: Has a brisk thrill over the right upper arm transposed fistula.  There is no hematoma or swelling to suggest infiltration. The fistula is not being cannulated due to previous problems cannulating.   Imaging:  (personally reviewed)    Michelina Nettle PA-C          [1]   Past Medical History:  Diagnosis Date    CHF (congestive heart failure) (CMS-HCC)     COVID-19 07/18/2019    Diabetes mellitus (CMS-HCC)     Family history of malignant neoplasm of prostate 06/23/2015    Heart disease     Heart murmur     Hepatic cirrhosis    (CMS-HCC) 06/23/2015    Overview:  Secondary to NASH; followed by Dr. Lindaann, GI.  Last Assessment & Plan:  Relevant Hx: Course: Daily Update: Today's Plan:     Hypertension     Hypogonadism in male 02/12/2014    Kidney stone     Liver cirrhosis secondary to NASH (nonalcoholic steatohepatitis) (CMS-HCC)     s/p liver transplant 2013    Obesity    [2]   Past Surgical History:  Procedure Laterality Date    CHOLECYSTECTOMY      liver transpant      LIVER TRANSPLANTATION  06/27/2011    PR ANASTOMOSIS,AV,ANY SITE Right 02/20/2024    Procedure: ARTERIOVENOUS ANASTOMOSIS, OPEN; DIRECT, ANY SITE, UPPER EXTREMITY;  Surgeon: Marchelle Kitchens, MD;  [facility];  Service: General Surgery    PR AV ANAST,UP ARM BASILIC VEIN TRANSPOSIT Right 03/31/2024    Procedure: ARTERIOVENOUS ANASTOMOSIS, OPEN; BY UPPER ARM BASILIC VEIN TRANSPOSITION;  Surgeon: Marchelle Kitchens, MD;  Location: [facility] OR [facility];  Service: General Surgery    PR AV FIST REVISE GRFT,W THROMBECTOMY Right 01/15/2023    Procedure: REVISION, ARTERIOVENOUS FISTULA W/ THROMBECTOMY, AUTOGENOUS OR NONAUTOGENOUS DIALYSIS GRAFT (SEP. PROC), LOWER EXTREMITY;  Surgeon: Marchelle Kitchens, MD;  Location: [facility] OR [facility];  Service: General Surgery    PR COLONOSCOPY FLX DX W/COLLJ SPEC WHEN PFRMD N/A 01/24/2024    Procedure: COLONOSCOPY, FLEXIBLE, PROXIMAL TO SPLENIC FLEXURE; DIAGNOSTIC, W/WO COLLECTION SPECIMEN BY BRUSH OR WASH;  Surgeon: Erick Prentice HERO, MD;  Location: [facility];  Service: Gastroenterology    PR COLONOSCOPY W/BIOPSY SINGLE/MULTIPLE N/A 08/13/2018    Procedure: COLONOSCOPY, FLEXIBLE, PROXIMAL TO SPLENIC FLEXURE; WITH BIOPSY, SINGLE OR MULTIPLE;  Surgeon: Thedora Alm Plain, MD;  Location: [facility];  Service: Gastroenterology    PR COLSC FLX W/RMVL OF TUMOR POLYP LESION SNARE TQ N/A 08/13/2018    Procedure: COLONOSCOPY FLEX; W/REMOV TUMOR/LES BY SNARE;  Surgeon: Thedora Alm Plain, MD;  Location: [facility];  Service: Gastroenterology    PR CREAT AV FISTULA,NON-AUTOGENOUS GRAFT Right 11/29/2022    Procedure: CREATE AV FISTULA (SEPARATE PROC); NONAUTOGENOUS GRAFT (EG, BIOLOGICAL COLLAGEN, THERMOPLASTIC GRAFT), UPPER EXTREMITY;  Surgeon: Marchelle Kitchens, MD;  Location: [facility] OR [facility];  Service: General Surgery    PR UPPER GI ENDOSCOPY,BIOPSY N/A 07/13/2015    Procedure: UGI ENDOSCOPY; WITH BIOPSY, SINGLE OR MULTIPLE;  Surgeon: Elspeth Jerilynn Reek, MD;  Location: [facility];  Service: Gastroenterology    PR UPPER GI ENDOSCOPY,BIOPSY N/A 02/13/2023    Procedure: UGI ENDOSCOPY; WITH BIOPSY, SINGLE OR MULTIPLE;  Surgeon: Minnie Krystal Claude, MD;  Location: GI [facility]URES [facility];  Service: Gastroenterology    PR UPPER GI ENDOSCOPY,DIAGNOSIS N/A 01/24/2024    Procedure: UGI ENDO, INCLUDE ESOPHAGUS, STOMACH, & DUODENUM &/OR JEJUNUM; DX W/WO COLLECTION SPECIMN, BY BRUSH OR WASH;  Surgeon: Erick Prentice HERO, MD;  Location:  [facility];  Service: Gastroenterology   [3]   Current Facility-Administered Medications   Medication Dose Route Frequency Provider Last Rate Last Admin    acetaminophen  (TYLENOL ) tablet 650 mg  650 mg Oral Q4H PRN Akolbire, Dunstan Atiayarne, MD   650 mg at 07/09/24 0811    apixaban  (ELIQUIS ) tablet 5 mg  5 mg Oral BID Southwell, Keva Doreen, PA        aspirin  chewable tablet 81 mg  81 mg Oral Daily Akolbire, Dunstan Atiayarne, MD   81 mg at 07/09/24 9188    calcitriol  (ROCALTROL ) capsule 0.5 mcg  0.5 mcg Oral 3x weekly in dialysis Akolbire, Dunstan Atiayarne, MD        cholecalciferol  (vitamin D3 25 mcg (1,000 units)) tablet 50 mcg  50 mcg Oral Daily Akolbire, Dunstan Atiayarne, MD   50 mcg at 07/09/24 9188    cyclobenzaprine (FLEXERIL) tablet 10 mg  10 mg Oral TID Southwell, Keva Doreen, PA        dextrose  50 % in water (D50W) 50 % solution 12.5 g  12.5 g Intravenous Q15 Min PRN Akolbire, Dunstan Atiayarne, MD        gabapentin  (NEURONTIN ) capsule 300 mg  300 mg Oral Nightly Akolbire, Dunstan Atiayarne, MD   300 mg at 07/09/24 0037 glucagon  injection 1 mg  1 mg Intramuscular Once PRN Akolbire, Dunstan Atiayarne, MD        glucose chewable tablet 16 g  16 g Oral Q10 Min PRN Akolbire, Dunstan Atiayarne, MD        HYDROmorphone  (DILAUDID ) injection Syrg 0.5 mg  0.5 mg Intravenous Q4H PRN Southwell, Keva Doreen, PA        HYDROmorphone  (DILAUDID ) tablet 1 mg  1 mg Oral Q4H PRN Southwell, Keva Doreen, [place]        insulin  glargine (LANTUS ) injection BASAL 30 Units  30 Units Subcutaneous Nightly Akolbire, Dunstan Atiayarne, MD   30 Units at 07/09/24 0026    insulin  lispro (HumaLOG ) injection CORRECTIONAL 0-20 Units  0-20 Units Subcutaneous Q4H SCH Akolbire, Dunstan Atiayarne, MD        mycophenolate  (CELLCEPT ) capsule 250 mg  250 mg Oral BID Akolbire, Dunstan Atiayarne, MD   250 mg at 07/09/24 9188    pantoprazole  (Protonix ) EC tablet 40 mg  40 mg Oral Daily before breakfast Akolbire, Dunstan Atiayarne, MD   40 mg at 07/09/24 9188    prochlorperazine  (COMPAZINE ) injection 5 mg  5 mg Intravenous Q6H PRN Akolbire, Dunstan Atiayarne, MD        rifAXIMin  (XIFAXAN ) tablet 550 mg  550 mg Oral BID Akolbire, Dunstan Atiayarne, MD   550 mg at 07/09/24 9188    sevelamer  (RENVELA ) tablet 2,400 mg  2,400 mg Oral 3xd Meals Akolbire, Dunstan Atiayarne, MD   2,400 mg at 07/09/24 9188    tacrolimus  (PROGRAF ) capsule 0.5 mg  0.5 mg Oral BID Akolbire, Dunstan Atiayarne, MD   0.5 mg at 07/09/24 9188    ursodiol  (ACTIGALL ) capsule 600 mg  600 mg Oral BID Akolbire, Dunstan Atiayarne, MD   600 mg at 07/09/24 [phone]

## 2024-07-09 NOTE — Consults (Signed)
 Nephrology ESKD Consult Note    Requesting provider: Lestine Raeann Haws, PA  Service requesting consult: Med Undesignated (MDX)  Reason for consult: Evaluation for dialysis needs    Outpatient dialysis unit:   [facility]  [address]  [place] [address]     Assessment/Recommendations: Richard [name] is a 59 y.o. male with a past medical history notable for ESKD on in-center hemodialysis being evaluated at [facility] for abdominal pain.    # ESKD:   - Indication for acute dialysis?: No  - We will perform HD starting tomorrow and will otherwise attempt to continue pt's outpatient schedule if possible.   - Access: Left IJ tunneled catheter ; Vascular access functioning well- no indication for intervention. Has RUE AVF, needs staging surgery. Got PVL today and will follow up with Marchelle   - Hepatitis status: Hepatitis B surface antigen negative on 07/01/24.     # Volume / Hypertension:  - Volume: Will be cautious with fluid removal in the setting of abdominal pain, will increase TW to ~117 kg  - Will adjust daily as needed    # Acid-Base / Electrolytes:  - Will evaluate acid-base status and electrolyte balance daily and adjust dialysate as needed.  Lab Results   Component Value Date    K 4.8 07/08/2024    CO2 23.2 07/08/2024       # Anemia of Chronic Kidney Disease:  - ESA: Patient not on ESA ,    Lab Results   Component Value Date    HGB 11.9 (L) 07/08/2024       # Secondary Hyperparathyroidism/Hyperphosphatemia:  - Phosphate Binders: sevelamer  2-3 tablets with meals  - Activated Vit D: calcitriol  0.5 3 x a week  - Calcimimetics: sensipar 30 3 x a week  Lab Results   Component Value Date    PHOS 3.9 07/03/2024    CALCIUM  8.7 07/08/2024       Selina Pons, MD  07/09/2024 5:06 PM   Medical decision-making for 07/09/2024  Findings / Data     Patient has: []  acute illness w/systemic sxs  [mod]  []  two or more stable chronic illnesses [mod]  []  one chronic illness with acute exacerbation [mod]  []  acute complicated illness  [mod]  []  Undiagnosed new problem with uncertain prognosis  [mod] [x]  illness posing risk to life or bodily function (ex. AKI)  [high]  []  chronic illness with severe exacerbation/progression  [high]  []  chronic illness with severe side effects of treatment  [high] ESKD on RRT Probs At least 2:  Probs, Data, Risk   Mgm't requires: []  Prescription drug(s)  [mod]  []  Kidney biopsy  [mod]  []  Central line placement  [mod] []  High risk medication use and/or intensive toxicity monitoring [high]  [x]  Renal replacement therapy [high]  []  High risk kidney biopsy  [high]  []  Escalation of care  [high]  []  High risk central line placement  [high] RRT: High risk of complications from RRT requiring intensive monitoring Risk        _____________________________________________________________________________________    History of Present Illness: Richard [name] is a 59 y.o. male with ESKD on dialysis as well as liver transplant, HTN, DMT2, HF, GERD, obesity, being evaluated at [facility] for abdominal pain who is seen in consultation at the request of Keva Raeann Haws, PA and Med Undesignated (MDX). Nephrology has been consulted for evaluation of dialysis needs in the setting of end-stage kidney disease.    Reports that abdominal  pain has been in last hour of dialysis, abdominal pain is upper abdomen. Gets better when he is not on dialysis.  Reports that blood sugars fluid gains have improved significantly since going back on Mounjaro  15 mg weekly. He was due for dose yesterday.   He requires 4 hours of dialysis to meet adequacy (used to require 4.5 hr). He has been doing well with fluid gains while being on mounjaro .   He got PVL done of his access. Dr. Marchelle will use this to plan for a staging surgery of his access.   Planning for CTA of abdomen today to look for ischemic colitis.     He has perumbilical hernia, but has no pain in this area.     Physical Exam:  Vitals:    07/09/24 1200 BP: 126/81   Pulse: 72   Resp: 20   Temp: 36.6 ??C (97.9 ??F)   SpO2: 97%     I/O this shift:  In: 750 [P.O.:750]  Out: -     Intake/Output Summary (Last 24 hours) at 07/09/2024 1706  Last data filed at 07/09/2024 1615  Gross per 24 hour   Intake 750 ml   Output --   Net 750 ml       General: well-appearing, no acute distress  Heart: RRR, no m/r/g  Lungs: CTAB, normal wob  Abd: soft, non-tender, non-distended  Ext: no edema  Access: Left IJ tunneled catheter  , RUE AVF - good thrill

## 2024-07-10 MED ADMIN — aspirin chewable tablet 81 mg: 81 mg | ORAL | @ 13:00:00

## 2024-07-10 MED ADMIN — gentamicin-sodium citrate lock solution in NS: 2.1 mL | @ 19:00:00

## 2024-07-10 MED ADMIN — tacrolimus (PROGRAF) capsule 0.5 mg: .5 mg | ORAL | @ 02:00:00

## 2024-07-10 MED ADMIN — tacrolimus (PROGRAF) capsule 0.5 mg: .5 mg | ORAL | @ 13:00:00

## 2024-07-10 MED ADMIN — sevelamer (RENVELA) tablet 2,400 mg: 2400 mg | ORAL | @ 20:00:00

## 2024-07-10 MED ADMIN — sevelamer (RENVELA) tablet 2,400 mg: 2400 mg | ORAL

## 2024-07-10 MED ADMIN — HYDROmorphone (DILAUDID) injection Syrg 0.5 mg: .5 mg | INTRAVENOUS | Stop: 2024-07-15

## 2024-07-10 MED ADMIN — cholecalciferol (vitamin D3 25 mcg (1,000 units)) tablet 50 mcg: 50 ug | ORAL | @ 13:00:00

## 2024-07-10 MED ADMIN — heparin (porcine) 1000 unit/mL injection 4,000 Units: 4000 [IU] | @ 15:00:00

## 2024-07-10 MED ADMIN — rifAXIMin (XIFAXAN) tablet 550 mg: 550 mg | ORAL | @ 13:00:00 | Stop: 2024-07-13

## 2024-07-10 MED ADMIN — rifAXIMin (XIFAXAN) tablet 550 mg: 550 mg | ORAL | @ 02:00:00 | Stop: 2024-07-13

## 2024-07-10 MED ADMIN — ursodiol (ACTIGALL) capsule 600 mg: 600 mg | ORAL | @ 02:00:00

## 2024-07-10 MED ADMIN — ursodiol (ACTIGALL) capsule 600 mg: 600 mg | ORAL | @ 13:00:00

## 2024-07-10 MED ADMIN — gabapentin (NEURONTIN) capsule 300 mg: 300 mg | ORAL | @ 02:00:00

## 2024-07-10 MED ADMIN — heparin (porcine) 1000 unit/mL injection 4,000 Units: 4000 [IU] | @ 17:00:00

## 2024-07-10 MED ADMIN — mycophenolate (CELLCEPT) capsule 250 mg: 250 mg | ORAL | @ 02:00:00

## 2024-07-10 MED ADMIN — mycophenolate (CELLCEPT) capsule 250 mg: 250 mg | ORAL | @ 13:00:00

## 2024-07-10 MED ADMIN — HYDROmorphone (DILAUDID) injection Syrg 0.5 mg: .5 mg | INTRAVENOUS | @ 09:00:00 | Stop: 2024-07-10

## 2024-07-10 MED ADMIN — HYDROmorphone (DILAUDID) injection Syrg 0.5 mg: .5 mg | INTRAVENOUS | @ 02:00:00 | Stop: 2024-07-15

## 2024-07-10 MED ADMIN — HYDROmorphone (DILAUDID) injection Syrg 0.5 mg: .5 mg | INTRAVENOUS | @ 13:00:00 | Stop: 2024-07-10

## 2024-07-10 MED ADMIN — insulin lispro (HumaLOG) injection CORRECTIONAL 0-20 Units: 0-20 [IU] | SUBCUTANEOUS | @ 02:00:00

## 2024-07-10 MED ADMIN — cyclobenzaprine (FLEXERIL) tablet 10 mg: 10 mg | ORAL | @ 02:00:00

## 2024-07-10 MED ADMIN — cyclobenzaprine (FLEXERIL) tablet 10 mg: 10 mg | ORAL | @ 13:00:00

## 2024-07-10 MED ADMIN — cyclobenzaprine (FLEXERIL) tablet 10 mg: 10 mg | ORAL | @ 20:00:00

## 2024-07-10 MED ADMIN — pantoprazole (Protonix) EC tablet 40 mg: 40 mg | ORAL | @ 13:00:00

## 2024-07-10 MED ADMIN — calcitriol (ROCALTROL) capsule 0.5 mcg: .5 ug | ORAL | @ 15:00:00

## 2024-07-10 MED ADMIN — apixaban (ELIQUIS) tablet 5 mg: 5 mg | ORAL | @ 02:00:00

## 2024-07-10 MED ADMIN — apixaban (ELIQUIS) tablet 5 mg: 5 mg | ORAL | @ 13:00:00

## 2024-07-10 MED ADMIN — insulin glargine (LANTUS) injection BASAL 30 Units: 30 [IU] | SUBCUTANEOUS | @ 02:00:00

## 2024-07-10 MED ADMIN — HYDROmorphone (DILAUDID) injection Syrg 0.5 mg: .5 mg | INTRAVENOUS | @ 05:00:00 | Stop: 2024-07-09

## 2024-07-10 MED ADMIN — HYDROmorphone (DILAUDID) injection Syrg 0.5 mg: .5 mg | INTRAVENOUS | @ 20:00:00 | Stop: 2024-07-10

## 2024-07-10 MED ADMIN — cinacalcet (SENSIPAR) tablet 30 mg: 30 mg | ORAL | @ 15:00:00

## 2024-07-10 NOTE — Consults (Signed)
 Palliative Care Consult Note      Consultation from Requesting Attending Physician:  Lestine Raeann Haws, PA  Service Requesting Consult:  Med Undesignated (MDX)  Reason for Consult Request from Attending Physician:  Evaluation of Symptoms  Primary Care Provider:  Jama Mayo, MD    Assessment/Plan:      SUMMARY:  This 59 y.o. patient is seriously and acutely ill due to ESRD due to CNI toxicity and HTN (on hemodialysis T/Th/Sat), complicated by co-morbid acute and chronic conditions including [name] cirrhosis s/p transplant (02/2012), T2DM, HFpEF, hypertension, and dyslipidemia.  Palliative care was consulted for assistance with pain control.    Symptom Assessment and Recommendations:      Chronic intermittent abdominal pain  Of unknown origin.  Described as strong, heavy pain that occurs with standing, walking, exertion, and during HD sessions.  Has been going on for a long time.  Resolves completely when not engaged in any of those activities.  Has an umbilical hernia on imaging, but not evident on physical exam; evaluated by surgery and not thought to necessarily be driver of sx.  Primary team obtained CTA to assess for mesenteric ischemia which was normal.  Overall very unclear what is driving his pain.  - Acknowledged uncertainty about driver of pain and validated pt's frustration  - Agree with tylenol  PRN for mild pain  - Recommend 2 mg PO HM q4 hours PRN 1st line moderate-severe pain  - Continue 0.5 mg IV HM q4 hours PRN 2nd line moderate-severe pain    Goals of Care and Decision Making Assessment and Recommendations:     Healthcare Decision Maker if lacks capacity:    HCDM (patient stated preference): [name] - Sister [phone]    Advance Directive: no    Code status:   Code Status: Full Code       Practical, Emotional, Spiritual Assessment and Recommendations:  None.      Recommendations discussed with primary team via Epic chat      Thank you for this consult. Please contact Darryle Dustman MD or page Palliative Care if there are any questions.  Palliative Care plans to visit the patient again on 07/11/23.    Subjective:     HPI:  obtained from pt.    Fard reports his pain is located in his abdomen, particularly the lower aspect.  Comes on if he is getting dialysis.  On with walking or exertion of any kind.  He also notices that he is not exerting himself but is just standing for prolonged period, such as when he is shaving in the bathroom.  He describes it as a very heavy, strong pain.  It occasionally radiates to his sides, lower back, but mostly stays in his lower abdomen.  It is not associated with numbness or tingling.  Sometimes when he is really strong pain episodes, they are associated with him feeling very hot, dizzy, and nauseous with vomiting.  At home, he tried taking 25 mg of oxycodone  (5 of the 5 mg pills he had at home) which did not help much.  In the hospital, he has received 1 mg oral hydromorphone  which did not, as well as Flexeril-did not really help either.  What has helped is 0.5 mg IV HM, although it only knocks the pain down from about an 8 to a 6 this is manageable enough for him to function.    Denies other sx at this time.    Symptom Severity, Assessment and Current Medication / Treatment:  Pain:  Significant, see above  Shortness of breath:  None  Nausea:  Sometimes with pain episodes  Constipation:  None    Living situation:   Lives in [place]  Support system / caregivers:  Wife    Objective:       Function:  70% - Ambulation: Reduced / unable to do normal work, some evidence of disease / Self-Care: Full / Intake: Normal or reduced / Level of Conscious: Full    Temp:  [36.6 ??C (97.9 ??F)-36.8 ??C (98.2 ??F)] 36.6 ??C (97.9 ??F)  Pulse:  [70-80] 75  Resp:  [17-18] 17  BP: (118-170)/(81-98) 153/87  SpO2:  [98 %-100 %] 100 %    Physical Exam:  General:  Well appearing middle aged male in NAD sitting up in HD chair  HEENT:  Normocephalic  Eyes:  EOM intact  Cardiovascular: RRR  Pulmonary:  CTAB, normal WOB on RA  Gastrointestinal:  Soft, moderate TTP at and slightly below umbilicus  Skin:  No rashes on clothed exam  Psych:  Good mood and normal affect    Testing reviewed and interpreted:   Reviewed and interpreted test results for CTA without evidence of mesenteric ischemia. And No acute intra-abdominal pathology. affecting assessment of underlying illness severity and prognosis    I personally spent 60 minutes face-to-face and non-face-to-face in the care of this patient, which includes all pre, intra, and post visit time on the date of service.  All documented time was specific to the E/M visit and does not include any procedures that may have been performed.     See ACP Note from today for additional billable service:  No.    Darryle Dustman MD  HPM Fellow

## 2024-07-10 NOTE — Procedures (Signed)
 HEMODIALYSIS NURSE PROCEDURE NOTE       Treatment Number:  1 Room / Station:  1    Procedure Date:  07/10/2024 Device Name/Number: Daisy    Total Dialysis Treatment Time:    Min.    CONSENT:    Written consent was obtained prior to the procedure and is detailed in the medical record.  Prior to the start of the procedure, a time out was taken and the identity of the patient was confirmed via name, medical record number and date of birth.     WEIGHT:   Date/Time Pre-Treatment Weight (kg) Estimated Dry Weight (kg) Patient Goal Weight (kg) Total Goal Weight (kg)    07/10/24 0922 116.5 kg (256 lb 13.4 oz)  117 kg (257 lb 15 oz)  1 kg (2 lb 3.3 oz)  1.55 kg (3 lb 6.7 oz)        Date/Time Post-Treatment Weight (kg) Treatment Weight Change (kg)    07/10/24 1340 115.2 kg (253 lb 15.5 oz)  -1.3 kg      Active Dialysis Orders (168h ago, onward)       Start     Ordered    07/12/24 0700  Hemodialysis inpatient  Every Tue,Thu,Sat,   Status:  Canceled      Question Answer Comment   Patient HD Status: Chronic    New Start? No    K+ 3 meq/L    Ca++ 2.5 meq/L    Bicarb 35 meq/L    Na+ 137 meq/L    Na+ Modeling none    Dialyzer [id number]    Dialysate Temperature (C) 36.5    BFR-As tolerated to a maximum of: 400 mL/min    DFR 800 mL/min    Duration of treatment 4 Hr    Dry weight (kg) 117 kg    Challenge dry weight (kg) no    Fluid removal (L) edw    Tubing Adult = 142 ml    Access Site Other (please specify) 1 needle in arterial side of AVF   Access Site Location Left    Keep SBP >: 90        07/10/24 0846    07/10/24 1125  Hemodialysis inpatient  Every Tue,Thu,Sat      Question Answer Comment   Patient HD Status: Chronic    New Start? No    K+ 3 meq/L    Ca++ 2.5 meq/L    Bicarb 35 meq/L    Na+ 137 meq/L    Na+ Modeling none    Dialyzer [id number]    Dialysate Temperature (C) 36.5    BFR-As tolerated to a maximum of: 400 mL/min    DFR 800 mL/min    Duration of treatment 4 Hr    Dry weight (kg) TBD    Challenge dry weight (kg) yes    Fluid removal (L) 1.5 L 1/15    Tubing Adult = 142 ml    Access Site Other (please specify) 1 needle in arterial side of AVF   Access Site Location Left    Keep SBP >: 90        07/10/24 1124                  ASSESSMENT:  General appearance: Alert  Neurologic: A/Ox3  Lungs: diminished  Heart: S1S2  Abdomen: no complaints    ACCESS SITE:       Hemodialysis Catheter 05/20/24 Left Internal jugular 2.1 mL 2.1 mL (Active)   Site Assessment Clean;Dry;Intact 07/10/24  1340   Proximal Lumen Status / Patency Gentamicin  Citrate Locked 07/10/24 1340   Proximal Lumen Intervention Deaccessed 07/10/24 1340   Medial Lumen Status / Patency Gentamicin  Citrate Locked 07/10/24 1340   Medial Lumen Intervention Deaccessed 07/10/24 1340   Dressing Intervention No intervention needed 07/10/24 1340   Dressing Status      Clean;Dry;Intact/not removed 07/10/24 1340   Verification by X-ray Yes 05/20/24 1644   Site Condition No complications 07/10/24 1340   Dressing Type CHG gel;Occlusive;Transparent 07/10/24 1340   Dressing Change Due 07/16/24 07/10/24 1340   Line Necessity Reviewed? Y 07/10/24 1340   Line Necessity Indications Yes - Hemodialysis 07/10/24 1340   Line Necessity Reviewed With Nephrology 07/10/24 1340        Arteriovenous Fistula - Vein Graft  Access Arteriovenous fistula Right;Upper Arm (Active)   Site Assessment Clean;Dry;Intact 07/10/24 1340   AV Fistula Thrill Present;Bruit Present 07/10/24 1340   Status Other (Comment) 07/10/24 1340   Dressing Intervention No intervention needed 07/10/24 0945   Dressing Status      Clean;Dry;Intact/not removed 07/10/24 0945   Site Condition No complications 07/10/24 0945   Dressing Gauze 07/10/24 0945   Dressing To Be Removed (Date/Time) @1500  07/10/24 07/10/24 0945     Catheter fill volumes:    Arterial: 2.0 mL Venous: 2.1 mL   Catheter filled with Gentamicin  citrate post procedure.     Patient Lines/Drains/Airways Status       Active Peripheral & Central Intravenous Access       Name Placement date Placement time Site Days    Peripheral IV 07/08/24 Anterior;Left;Proximal Forearm 07/08/24  1500  Forearm  1    Hemodialysis Catheter 05/20/24 Left Internal jugular 2.1 mL 2.1 mL 05/20/24  1611  Internal jugular  50                   LAB RESULTS:  Lab Results   Component Value Date    NA 139 07/08/2024    K 4.8 07/08/2024    CL 98 07/08/2024    CO2 23.2 07/08/2024    BUN 8 (L) 07/08/2024    CREATININE 5.83 (H) 07/08/2024    GLU 200 (H) 07/08/2024    GLUF 138 10/06/2014    CALCIUM  8.7 07/08/2024    CAION 3.67 (L) 02/02/2024    ICAV 4.88 03/04/2012    PHOS 3.9 07/03/2024    MG 2.1 07/08/2024    PTH 943.9 (H) 05/26/2024    IRON 95 11/09/2022    LABIRON 36 11/09/2022    TRANSFERRIN 212.0 (L) 11/09/2022    FERRITIN 169.8 11/09/2022    TIBC 267.1 11/09/2022     Lab Results   Component Value Date    WBC 5.6 07/08/2024    HGB 11.9 (L) 07/08/2024    HCT 36.2 (L) 07/08/2024    PLT 203 07/08/2024    PHART 7.36 03/04/2012    PO2ART 88 03/04/2012    PCO2ART 43 03/04/2012    HCO3ART 23.4 03/04/2012    BEART -1.3 03/04/2012    O2SATART 98.1 03/04/2012    PTINR : 05/06/2012    APTT 290.0 (HH) 01/19/2024        VITAL SIGNS:    Date/Time Temp Temp src       07/10/24 1348 36.4 ??C (97.5 ??F)  --        Date/Time Pulse BP MAP (mmHg) Patient Position    07/10/24 1348 72  160/92  115  Sitting     07/10/24 1340 --  --  --  Sitting     07/10/24 1330 76  159/71  --  Sitting     07/10/24 1300 75  153/87  --  Sitting     07/10/24 1230 77  154/96  --  Sitting     07/10/24 1200 78  132/98  --  Sitting     07/10/24 1130 74  149/88  --  Sitting     07/10/24 1115 --  --  --  Sitting     07/10/24 1100 78  170/82  --  Sitting     07/10/24 1030 80  149/95  --  Sitting     07/10/24 1000 72  149/87  --  Lying       Date/Time Blood Volume Change (%) HCT HGB Critline O2 SAT %    07/10/24 1340 -9.5 %  38.7  13.2  65.3     07/10/24 1330 -9.1 %  38.6  13.1  61     07/10/24 1300 -7 %  37.7  12.8  59.2     07/10/24 1230 -6.2 %  37.4  12.7  61.6 07/10/24 1200 -6.1 %  37.3  12.7  65     07/10/24 1130 -5.3 %  37  12.6  62.2     07/10/24 1115 -4.7 %  36.8  12.5  65.2     07/10/24 1100 -4.6 %  36.7  12.5  64.2     07/10/24 1030 -4 %  36.5  12.4  63.5     07/10/24 1000 -2.3 %  35.9  12.2  66       Date/Time Resp SpO2 O2 Device FiO2 (%) O2 Flow Rate (L/min)    07/10/24 1348 18  --  None (Room air)  -- --     07/10/24 1340 --  --  None (Room air)  -- --     07/10/24 1330 18  --  None (Room air)  -- --     07/10/24 1300 17  --  None (Room air)  -- --     07/10/24 1230 --  --  None (Room air)  -- --     07/10/24 1200 17  --  None (Room air)  -- --     07/10/24 1130 17  --  None (Room air)  -- --     07/10/24 1115 --  --  None (Room air)  -- --     07/10/24 1100 18  --  None (Room air)  -- --     07/10/24 1030 17  --  None (Room air)  -- --     07/10/24 1000 17  --  None (Room air)  -- --         Date/Time Therapy Number Dialyzer Hemodialysis Line Type All Machine Alarms Passed    07/10/24 0922 1  F-180 (98 mLs)  Adult (142 m/s)  Yes       Date/Time Air Detector Saline Line Double Clampled Hemo-Safe Applied Dialysis Flow (mL/min)    07/10/24 0922 Engaged  --  --  800 mL/min       Date/Time Verify Priming Solution Priming Volume Hemodialysis Independent pH Hemodialysis Machine Conductivity (mS/cm)    07/10/24 0922 0.9% NS  300 mL  --  13.9 mS/cm       Date/Time Hemodialysis Independent Conductivity (mS/cm) Bicarb Conductivity Residual Bleach Negative Total Chlorine    07/10/24 0922 13.9 mS/cm  -- Yes  0       Date/Time Pre-Hemodialysis  Comments    07/10/24 0922 alert, arrived in [facility]       Date/Time Blood Flow Rate (mL/min) Arterial Pressure (mmHg) Venous Pressure (mmHg) Transmembrane Pressure (mmHg)    07/10/24 1340 150 mL/min  -252 mmHg  154 mmHg  36 mmHg     07/10/24 1330 400 mL/min  -254 mmHg  161 mmHg  13 mmHg     07/10/24 1300 400 mL/min  -246 mmHg  150 mmHg  37 mmHg     07/10/24 1230 400 mL/min  -250 mmHg  155 mmHg  20 mmHg     07/10/24 1200 400 mL/min  -242 mmHg 150 mmHg  16 mmHg     07/10/24 1130 400 mL/min  -235 mmHg  157 mmHg  19 mmHg     07/10/24 1115 400 mL/min  -245 mmHg  150 mmHg  22 mmHg     07/10/24 1100 400 mL/min  -262 mmHg  142 mmHg  23 mmHg     07/10/24 1030 400 mL/min  -246 mmHg  152 mmHg  16 mmHg     07/10/24 1000 400 mL/min  -238 mmHg  151 mmHg  17 mmHg     07/10/24 0940 400 mL/min  -220 mmHg  150 mmHg  20 mmHg       Date/Time Ultrafiltration Rate (mL/hr) Ultrafiltrate Removed (mL) Dialysate Flow Rate (mL/min) KECN (Kecn)    07/10/24 1340 830 mL/hr  1550 mL  800 ml/min  --     07/10/24 1330 830 mL/hr  1356 mL  800 ml/min  --     07/10/24 1300 830 mL/hr  947 mL  800 ml/min  --     07/10/24 1230 260 mL/hr  722 mL  800 ml/min  --     07/10/24 1200 260 mL/hr  593 mL  800 ml/min  --     07/10/24 1130 260 mL/hr  465 mL  800 ml/min  --     07/10/24 1115 260 mL/hr  400 mL  800 ml/min  --     07/10/24 1100 260 mL/hr  336 mL  800 ml/min  --     07/10/24 1030 260 mL/hr  208 mL  800 ml/min  --     07/10/24 1000 260 mL/hr  79 mL  800 ml/min  --     07/10/24 0940 260 mL/hr  --  800 ml/min  --       Date/Time Intra-Hemodialysis Comments    07/10/24 1340 TX terminated, rinse back given     07/10/24 1330 pt tolerating tx     07/10/24 1300 pt stable     07/10/24 1230 Pt on the phone     07/10/24 1200 pt stable     07/10/24 1130 pt tolerating tx     07/10/24 1115 Nephrology rounding, UF goal increased to 1l     07/10/24 1100 pt tolerating tx     07/10/24 1030 pt stable     07/10/24 1000 pt tolerating tx     07/10/24 0940 treatment initiated via cvc, attempted one arterial fistula needle but unsuccesful     07/10/24 0921 pre HD vitals       Date/Time Rinseback Volume (mL) On Line Clearance: spKt/V Total Liters Processed (L/min) Dialyzer Clearance    07/10/24 1340 300 mL  0.92 spKt/V  86.5 L/min  Moderately streaked       Date/Time Post-Hemodialysis Comments    07/10/24 1340 Alert and stable       Date/Time Total Hemodialysis Replacement Volume (  mL) Total Ultrafiltrate Output (mL)    07/10/24 1340 --  1000 mL        [id number] - Medicaitons Given During Treatment  (last 4 hrs)           NGAFI, GIWEH, RN         Medication Name Action Time Action Route Rate Dose User     calcitriol  (ROCALTROL ) capsule 0.5 mcg 07/10/24 1003 Given Oral  0.5 mcg Jeannie Patches, RN     cinacalcet [facility]) tablet 30 mg 07/10/24 1003 Given Oral  30 mg Ngafi, Giweh, RN     heparin  (porcine) 1000 unit/mL injection 4,000 Units 07/10/24 1148 Given Dialysis Circuit  4,000 Units Jeannie Patches, RN            Clorissa Gruenberg, RN         Medication Name Action Time Action Route Rate Dose User     gentamicin -sodium citrate  lock solution in NS 07/10/24 1340 Given Intra-cannular  2.1 mL El Corrigan, RN     gentamicin -sodium citrate  lock solution in NS 07/10/24 1340 Given Intra-cannular  2.1 mL El Corrigan, RN                      Patient tolerated treatment in a  Dialysis Recliner.

## 2024-07-10 NOTE — Plan of Care (Signed)
 4 hours HD today with uf goal of 1 liter as patient tolerates  Problem: Hemodialysis  Goal: Safe, Effective Therapy Delivery  Outcome: Shift Focus  Goal: Effective Tissue Perfusion  Outcome: Shift Focus  Goal: Absence of Infection Signs and Symptoms  Outcome: Shift Focus

## 2024-07-10 NOTE — Consults (Signed)
 Tacrolimus  Therapeutic Monitoring Pharmacy Note    Richard [name] is a 59 y.o. male continuing tacrolimus .     Indication: Liver transplant     Date of Transplant: 03/02/2012      Prior Dosing Information: Current regimen 0.5mg  twice a day.  Note: Patient reports home regimen was 1mg  am, 0.5mg  PM.   Last discharge from hospital   06/24/2024 listed tacrolimus  dose of 0.5mg  twice a day.  Last clinic note that lists a tacrolimus  dose (04/08/2024) also says 0.5mg  twice a day (reduced from 1mg  am 0.5mg  PM)       Source(s) of information used to determine prior to admission dosing: Patient/Caregiver, Home Medication List, or Clinic Note    Goals:  Therapeutic Drug Levels  Tacrolimus  trough goal: 2-4 ng/ml    Additional Clinical Monitoring/Outcomes  Monitor renal function (SCr and urine output) and liver function (LFTs)  Monitor for signs/symptoms of adverse events (e.g., hyperglycemia, hyperkalemia, hypomagnesemia, hypertension, headache, tremor)    Previous Lab Values  Tacrolimus , Trough   Date/Time Value Ref Range Status   07/09/2024 07:25 AM 3.9 (L) 5.0 - 15.0 ng/mL Final   06/24/2024 07:10 AM 3.2 (L) 5.0 - 15.0 ng/mL Final   05/26/2024 06:27 AM 1.7 (L) 5.0 - 15.0 ng/mL Final   04/17/2024 06:13 AM 3.2 (L) 5.0 - 15.0 ng/mL Final   04/01/2024 06:35 AM 4.5 (L) 5.0 - 15.0 ng/mL Final   10/06/2014 09:00 AM 6.8 <=20.0 ng/mL Final   08/31/2014 08:34 AM 4.2  Final   08/03/2014 08:22 AM 4.7  Final   07/07/2014 08:37 AM 4.2  Final   06/09/2014 08:27 AM 3.5  Final     Tacrolimus , Timed   Date/Time Value Ref Range Status   07/03/2024 01:21 PM 4.2 ng/mL Final   04/08/2024 08:28 AM 6.8 ng/mL Final       Result:  Tacrolimus  level from Yesterday was drawn 7 hours after dose     Pharmacokinetic Considerations and Significant Drug Interactions:  Concurrent CYP3A4 substrates/inhibitors: None identified    Assessment/Plan:  Recommendedation(s)  Continue current regimen of 0.5mg  twice a day  Recheck tacrolimus  level on 07/11/24    Follow-up  Next level should be ordered on 07/11/24 at 0600.   A pharmacist will continue to monitor and recommend levels as appropriate    Please page service pharmacist with questions/clarifications.    DOROTHA Tanda Moats, Pharm D, BCPS, BCGP

## 2024-07-10 NOTE — Consults (Signed)
 [facility] FOLLOW UP NOTE    Patient Name: Richard [name]  Medical Record Number: [id number]  Referring Provider:  Lanette Jacklyn Boy, MD    Assessment and Plan: Richard [name] is a 59 y.o. 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 CT abd/pelvis revealed a persistent small fat-containing umbilical hernia with adjacent inflammatory stranding. On exam, no palpable hernia was felt. It is unclear if his abdominal symptoms are related to hernia. We are not currently recommending surgical intervention given small size of hernia, lack of bowel involvement,  and also his comorbid conditions increase his risk of surgical complications. CTA A/P negative for mesenteric ischemia as well.     He also is having continued issues with access of his fistula during HD. PVL fistula duplex patent. Thrill is strong and superficial.     Recommendations  -No surgical intervention this admission for umbilical hernia   -Okay to resume Eliquis   -Allow time for swelling around fistula site to resolve from previous infiltration before re-cannulation   -Rest of care per primary      BP (!) 160/92  - Pulse 72  - Temp 36.4 ??C (97.5 ??F) (Oral)  - Resp 18  - Ht 177.8 cm (5' 10)  - Wt (!) 116.5 kg (256 lb 12.8 oz)  - SpO2 100%  - BMI 36.85 kg/m??  Body mass index is 36.85 kg/m??.    Physical exam:  Consitutional: NAD  Eyes: anicteric  CV: Regular rate  Resp: Unlabored breathing on RA  Abd: Soft, tender to palpation of periumbilical region, no hernia felt  Skin: Warm and dry  Psychiatric: Answering questions appropriately   Extremities: Has a brisk thrill over the right upper arm transposed fistula. Mild ecchymosis at the site. Redundant skin.       Armin Negri, MD  General Surgery, PGY-2  Pager: [phone]  Teaching Surgeon Attestation:  I, Oliva Sprinkles, M.D., saw and evaluated the patient separate from the Resident. I have reviewed and edited the above note, and I agree with the findings and the plan of care as documented in the Resident's note. I have discussed management with Dr. Teresia. There are continued problems with cannulation, even 1 needle. Exam and PVL study suggest a functional fistula. There was some thought that edema is a problem. Will give it some more time for now and try again. The only revision that could be accomplished surgically with this fistula would be to place an interposition graft, which is less than ideal in this particular person.

## 2024-07-10 NOTE — Plan of Care (Signed)
 Pt A&O x4 with c/o pain and discomfort throughout shift. IV prn medications given as ordered. Pt tolerated hemodialysis well, but c/o pain in lower right arm on return to shift, MD notified and came to bedside, medication relieved pain. All care explained, all questions addressed. Call bell in reach.  Shift Summary  HYDROmorphone  was administered twice during the shift, with pain scores improving after each dose.    Hemodialysis was completed and tolerated well, with all necessary medications administered for therapy and catheter care.    Fall prevention strategies were maintained throughout the shift, and no falls or injuries were documented.    No syncopal symptoms or changes in consciousness were noted, and cognition remained stable.    Overall, the shift was uneventful with stable status and no discharge needs identified.     Optimal Comfort and Wellbeing: Mood was anxious and hyper-verbal at the start of the shift, but no further mood documentation was provided; sleep/rest interventions were maintained throughout the shift to promote rest and minimize awakenings.     Absence of Syncopal Symptoms: No syncopal episodes or pre-syncope symptoms were documented during the shift, and level of consciousness remained alert with stable cognition.     Optimal Pain Control and Function: Pain scores remained at 0 throughout the shift, and HYDROmorphone  was administered twice, with no increase in pain reported after administration.     Absence of Fall and Fall-Related Injury: Fall reduction interventions were consistently maintained, including low bed, nonskid footwear, and regular toileting; no falls or injuries occurred.     Safe, Effective Therapy Delivery: Hemodialysis was tolerated well, with appropriate administration of heparin , calcitriol , cinacalcet, and gentamicin -sodium citrate , and no complications documented.

## 2024-07-10 NOTE — Procedures (Signed)
 [facility] Nephrology Hemodialysis Procedure Note     07/10/2024    Curtistine LITTIE Fried was seen and examined on hemodialysis    CHIEF COMPLAINT: End Stage Kidney Disease    INTERVAL HISTORY: Attempted to cannulate access with 1 needle, but too challenging (unable to advance needle, overlying edema from prior cannulation may be cause). Discussed with Dr. Marchelle, will rest it. CTA showed no ischemic bowel. Cautiously removing fluid to prevent abdominal pain (in case cramping is cause of abdominal pain).    DIALYSIS TREATMENT DATA:        Pre-Treatment Weight (kg): 116.5 kg (256 lb 13.4 oz)    Dialysis Bath  Bath: 3 K+ / 2.5 Ca+  Dialysate Na (mEq/L): 137 mEq/L  Dialysate HCO3 (mEq/L): 35 mEq/L Dialyzer: F-180 (98 mLs)   Blood Flow Rate (mL/min): 400 mL/min Dialysis Flow (mL/min): 800 mL/min   Machine Temperature (C): 36.5 ??C (97.7 ??F)      PHYSICAL EXAM:  Vitals:  Temp:  [36.6 ??C (97.9 ??F)-36.8 ??C (98.2 ??F)] 36.6 ??C (97.9 ??F)  Pulse:  [70-73] 72  BP: (118-150)/(81-97) 149/87  MAP (mmHg):  [94-107] 107    General: in no acute distress, currently dialyzing in a Hemodialysis Recliner  Pulmonary: normal respiratory effort  Cardiovascular: regular rate and rhythm  Extremities: no significant  edema  Access: Right IJ tunneled catheter     LAB DATA:  Lab Results   Component Value Date    NA 139 07/08/2024    K 4.8 07/08/2024    CL 98 07/08/2024    CO2 23.2 07/08/2024    BUN 8 (L) 07/08/2024    CREATININE 5.83 (H) 07/08/2024    GLUCOSE 196 03/03/2012    CALCIUM  8.7 07/08/2024    MG 2.1 07/08/2024    PHOS 3.9 07/03/2024    ALBUMIN 3.2 (L) 07/08/2024      Lab Results   Component Value Date    HCT 36.2 (L) 07/08/2024    WBC 5.6 07/08/2024        ASSESSMENT/PLAN:  End Stage Renal Disease on Intermittent Hemodialysis:  UF goal: 1.5 L as tolerated. EDW has changed. New EDW is 115kg.  Adjust medications for a GFR <10  Avoid nephrotoxic agents  Last HD Treatment:Started (07/10/24)     Bone Mineral Metabolism:  Lab Results   Component Value Date    CALCIUM  8.7 07/08/2024    CALCIUM  9.5 07/03/2024    Lab Results   Component Value Date    ALBUMIN 3.2 (L) 07/08/2024    ALBUMIN 3.5 07/03/2024      Lab Results   Component Value Date    PHOS 3.9 07/03/2024    PHOS 4.0 06/24/2024    Lab Results   Component Value Date    PTH 943.9 (H) 05/26/2024    PTH 267.5 (H) 10/14/2021      Labs appropriate, no changes.    Anemia:   Lab Results   Component Value Date    HGB 11.9 (L) 07/08/2024    HGB 12.2 (L) 07/03/2024    HGB 10.7 (L) 06/24/2024    Iron Saturation (%)   Date Value Ref Range Status   11/09/2022 36 % Final   05/15/2012 39 20 - 50 % Final      Lab Results   Component Value Date    FERRITIN 169.8 11/09/2022       Anemia labs appropriate, no changes.    Vascular Access:  Vascular Access functioning well - no need for intervention- regarding  catheter  RE: RUE AVF, will follow up outpatient  Blood Flow Rate (mL/min): 400 mL/min    IV Antibiotics to be administered at discharge:  No    This procedure was fully reviewed with the patient and/or their decision-maker. The risks, benefits, and alternatives were discussed prior to the procedure. All questions were answered and written informed consent was obtained.    Selina Pons, MD  [facility] Division of Nephrology & Hypertension

## 2024-07-10 NOTE — Progress Notes (Addendum)
 Hospital Medicine Daily Progress Note    Assessment/Plan:    Principal Problem:    Pre-syncope        Wound 05/20/24 chg gel and transparent dressing in place before PACU arrival (Active)   Dressing Status      Edges lifted 05/20/24 1730   Site Assessment Bleeding 05/20/24 1730   Dressing Other (Comment) 05/20/24 1720       Wound 06/23/24 Surgical Catheter Entry Leg Anterior;Proximal;Right;Upper (Active)   Dressing Status      Clean;Dry;Intact/not removed 06/23/24 2200   Peri-wound Assessment      Soft 06/23/24 1700   Dressing Transparent film dressing;Dry gauze 06/23/24 2200   Dressing Changed Reinforced 06/23/24 2200           JAMESROBERT OHANESIAN is a 59 y.o. male that presented to [facility] with Pre-syncope.    # Post HD Pre-syncope   Illness that poses a threat to life or bodily function without appropriate treatment  The patient presented with dizziness and a near syncopal episode. It occurs after dialysis, and self resolves over the following day.   EKG in the ED showed an ectopic atrial rhythm with prolonged QTc, troponins negative. Last ECHO in 10/2022 was fairly unremarkable with no significant valvular disease. Patient likely had vasovagal episode vs orthostasis following HD.  Orthostatics negative this AM. ECHO with G1DD, otherwise unremarkable. Refusing tele.  Discussed with nephro, pt has lost weight but team wasn't made aware, so they'd been running based on old wt. They may have been removing too much fluid as a result.   -CTM   -Adjust HD per nephro   -Fall precaution     # Chronic HD related Abdominal pain   # chronic umbilical hernia   CT abd/pelvis showing fat containing umbilical hernia with adjacent inflammatory stranding. This is not however the location of the pt's pain, which is above the umbilicus, in the epigastrium and RUQ, ending just above the umbilicus.   Surgery was consulted, no surgical needs. Appreciate consult.   This is actually a chronic issue for pt, he reports 2 weeks of sx, but chart review shows this is an ongoing issue. It starts at the end of HD, and self resolves over the next day. It also occurs with walking and resolves with rest. He has not been evaluated for mesenteric ischemia, which he has significant risk for. Per last admission note, concern he was not taking eliquis . Was admitted for aneurism and thrombosis of splenic artery. This is unchanged on imaging.   Discussed with nephrology, pt's outpatient provider currently on service. His abdo complaints after HD are longstanding, thought to be due to large fluid shifts causing abdominal cramping. Pt somewhat reluctant to consider this, says it's not cramps, it's real pain. I explained that cramping can cause real and severe pain. Pt remains resistant to the idea and changes to HD regimen may help.   In an abundance of caution, obtained mesenteric CTA, which was unremarkable and without explanation for pt's pain. There is no identifiable acute cause of pain at this time. The temporal relationship with HD makes HD related fluid shifts likely the cause of sx.   -IV Dilaudid  0.5mg  refusing to use PO   -IV Compazine  prn  -Monitor electrolytes  -continue cyclobenzaprine  -Consult palliative, would benefit from developing relationship early (not eligible for liver transplant, though he hopes to appeal)   -Chronic pain referral at dc.          Secondary/Additional Active Problems:      #  Type 2 diabetes mellitus with hyperglycemia   Last A1c 10.6% on 05/25/2024.  Insulin  sliding scale  Lantus  30u at bedtime (patient takes 70u at home)  Hypoglycemia protocol     # Ectopic atrial rhythm   # Bifascicular block   # Prolonged QTc  New ectopic atrial rhythm with possible premature ventricular beats or fusion complexes noted on ECG. QT interval was lengthened to 506 ms (QTC 645 ms, QTC Fredericia 596 ms). There was also right bundle branch block and left anterior fascicular block, consistent with bifascicular block.  Pt refusing telemetry monitoring.  Avoid QT prolonging medications  Repeat EKG this afternoon      # Anemia of chronic kidney disease   Hemoglobin 11.9 g/dL, hematocrit 63.7%, and RBC 4.21 10*12/L on 07/08/2024, consistent with chronic anemia likely secondary to ESRD.  Monitor CBC.     # Lymphocytopenia   Absolute lymphocytes 0.5 10*9/L on 07/08/2024. Patient is on immunosuppressants (tacrolimus , mycophenolate  mofetil) post-liver transplant.  Monitor CBC.     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 Liver biopsy 01/11/2024 showed no acute rejection but sinusoidal dilatation and iron deposition. Patient is on tacrolimus , mycophenolate  mofetil, rifaximin , and ursodeoxycholate. He does not take lactulose .  Continue tacrolimus  0.5mg  bid  Continue mycophenolate  mofetil 250mg  po bid   Continue rifaximin  po bid  Continue ursodeoxycholate po bid.   Outpatient hepatology follow-up.     # End-stage renal disease on hemodialysis TTS  Dialysis staff have had difficulty cannulating his fistula and patient has been dialyzed via tunneled left IJ catheter for last few months pending duplex of fistula.   Obtain right arm PVL to assess fistula  Continue hemodialysis routinely.  Continue calcitriol , sevelamer .  Consult nephrology for resumption of dialysis if still in-house on Thursday     # Essential hypertension   Patient no longer on antihypertensives at home  Monitor blood pressure.     # Chronic compensated diastolic heart failure with preserved ejection fraction   History of chronic diastolic heart failure with preserved ejection fraction (LVEF >55% on 11/17/2022,).  Continue routine dialysis for fluid removal  Ensure adequate BP control  Daily weight  Low salt diet  Outpatient follow-up. # Coronary artery disease   Continue aspirin  po daily.     # Aneurysm of splenic artery, status post stenting   History of 2.6 cm splenic artery aneurysm s/p stenting on 06/23/2024. Follow-up CT recommended in 1 month. Patient is on aspirin .  Continue aspirin .   Follow-up imaging as recommended.     # Secondary hypercoagulable state due to history of deep venous thrombosis and atrial fibrillation   History of deep vein thrombosis (left internal jugular vein DVT 01/28/2024) and paroxysmal atrial fibrillation, managed with apixaban .  -resume apixaban       # Gastroesophageal reflux disease   History of GERD, with severe reflux.  Continue pantoprazole  po daily.     # Obesity class 2  History of obesity, current BMI 36.61 kg/m2. On tirzepatide  at home.  Continue weight management strategies.  Resume tirzepatide  on discharge.     # Diabetic polyneuropathy   Continue gabapentin  po at bedtime.     # First degree atrioventricular block   Chronic finding on ECGs, most recently 07/08/2024.  Continue telemetry monitoring.  Prophylaxis  -SCD     Diet  -Nutrition Therapy Renal     Code Status / HCDM  -Full Code,   -  HCDM (patient stated preference): [name] - Sister - [phone]     Anticipated Medically Ready for Discharge: Anticipated Tomorrow    I personally spent 35 minutes face-to-face and non-face-to-face in the care of this patient, which includes all pre, intra, and post visit time on the date of service.  All documented time was specific to the E/M visit and does not include any procedures that may have been performed.    ___________________________________________________________________    Subjective:  Reviewed CTA results, lack of acute findings to explain pain. Discussed that temporal relationship to HD makes me certain it is related to HD. Reviewed the idea that they may need to slow HD or remove less fluid to help, that his nephrologist thought that too much fluid off might be contributing. He disagrees. Nonetheless, explained that we have ruled out serious causes and that further eval will be needed in the outpatient setting over time, working with nephro and HD team to reduce sx where possible, etc.   Discussed dc today, pt very alarmed by this, as he anticipates worsening pain through this evening as he is dialyzing now.  He has refused oral pain meds, so dc with PO is not appropriate. He's asking to stay to tomorrow to have ongoing access to IV medications overnight. While I am hesitant to do this, I do agree that it's likely he will return to the ED this evening were he to be dc'd.      General ROS: negative     Labs/Studies:  Labs and Studies from the last 24hrs per EMR and Reviewed    Objective:  Temp:  [36.6 ??C (97.9 ??F)-36.8 ??C (98.2 ??F)] 36.6 ??C (97.9 ??F)  Pulse:  [70-73] 72  Resp:  [17-20] 17  BP: (118-150)/(81-97) 149/87  SpO2:  [97 %-100 %] 100 %    GEN: NAD, seen at HD   EYES: EOMI  ENT: MMM  CV: WWP  PULM: NWOB on RA  EXT: No edema

## 2024-07-10 NOTE — Hospital Course (Addendum)
 Refer to primary care      Richard [name] is a 59 year old male with a history of end-stage renal disease on hemodialysis, liver cirrhosis secondary to NASH status post liver transplant, coronary artery disease, hypertension, heart failure with preserved ejection fraction, and type 2 diabetes mellitus, who was admitted for evaluation of pre-syncope following a near-syncopal episode after dialysis.    PROBLEM-ORIENTED SUMMARY    Pre-syncope and Chronic Cardiac Conduction Abnormalities   He presented with dizziness and a near-syncopal episode after hemodialysis, without loss of consciousness or injury. EKG in the ED showed an ectopic atrial rhythm, bifascicular block (right bundle branch block and left anterior fascicular block), and prolonged QTc (QT interval 506 ms, QTC 645 ms, QTC Fredericia 596 ms). Troponins were negative and echocardiogram was unremarkable. Orthostatic vitals were negative during admission. He reported similar episodes after dialysis that self-resolve. Telemetry monitoring was recommended but he refused ongoing monitoring. Serial EKGs were performed and QT-prolonging medications were avoided.  Symptoms occurring after HD, likely related to fluid shifts     Hemodialysis-Related Abdominal Pain   He experienced severe, intermittent abdominal pain, typically in the last hour of dialysis and with exertion, which resolved with rest and between sessions. Pain was described as strong and heavy, occasionally radiating to the back and associated with nausea and emesis. CT abdomen/pelvis showed a small fat-containing umbilical hernia with adjacent inflammatory stranding, but pain location was above the umbilicus, in the epigastrium and RUQ. General surgery was consulted and did not recommend intervention due to lack of bowel involvement and high surgical risk. Mesenteric CTA was negative for ischemia. Nephrology and palliative care were consulted; pain was managed with IV hydromorphone  and acetaminophen , with partial relief. Hemodialysis regimen was adjusted to reduce fluid removal, and outpatient chronic pain referral was recommended.    End-Stage Renal Disease and Vascular Access Issues   He is on chronic hemodialysis via a left internal jugular tunneled catheter due to difficulty cannulating his right upper arm AV fistula. PVL duplex showed a patent fistula with strong thrill, but cannulation remained challenging due to overlying edema. Nephrology recommended cautious fluid removal and continued outpatient follow-up for vascular access management. No acute intervention was required for the catheter or fistula during admission.    Type 2 Diabetes Mellitus with Hyperglycemia   He has poorly controlled diabetes (last A1c 10.6% on 05/25/2024). During admission, he received correctional insulin  and a reduced dose of insulin  glargine (30 units at bedtime, compared to 70 units at home). Blood glucose was monitored and managed per protocol.    Chronic Cardiac Disease   He has a history of heart failure with preserved ejection fraction (LVEF >55% on 10/2022) and coronary artery disease. No acute decompensation was noted. Daily weights and blood pressure monitoring were performed, and routine dialysis for fluid management was continued.    Chronic Liver Disease and Immunosuppression   He is status post liver transplant for NASH cirrhosis (transplant 02/2012), with history of portal hypertension and hepatic encephalopathy. He remains on immunosuppressive therapy (tacrolimus , mycophenolate  mofetil) and rifaximin , with therapeutic drug monitoring for tacrolimus . No evidence of acute rejection or hepatic decompensation during admission. Outpatient hepatology follow-up was recommended.    Anemia of Chronic Kidney Disease and Lymphocytopenia   He has chronic anemia (Hgb 11.9 g/dL) and lymphocytopenia (absolute lymphocytes 0.5 x 10^9/L), likely secondary to ESRD and immunosuppression. CBC was monitored; no acute intervention was required.    Umbilical Hernia   Imaging revealed a small fat-containing umbilical hernia with adjacent  inflammatory stranding. General surgery determined no surgical intervention was needed during this admission due to lack of bowel involvement and high operative risk. The hernia did not correlate with the location of his pain.    Secondary Hypercoagulable State   He has a history of deep vein thrombosis and paroxysmal atrial fibrillation, managed with apixaban . Apixaban  was held initially for possible surgery, then resumed after surgical evaluation determined no intervention was needed.    Other Chronic Conditions   He has class 2 obesity (BMI ~36.5 kg/m^2), GERD, diabetic polyneuropathy, and first-degree AV block. These were managed per outpatient regimen and did not require acute intervention during hospitalization.    Pain Management and Palliative Care   Palliative care was consulted for chronic abdominal pain. Pain was managed with acetaminophen  and IV hydromorphone , with recommendations for oral hydromorphone  as first-line for moderate-severe pain. Chronic pain referral was recommended for outpatient management.    Hospital Course   During admission, he remained alert and oriented, tolerated hemodialysis, and had no further syncopal episodes. Pain was intermittently severe but responded to IV analgesia. No acute hospital-acquired complications occurred. He was medically ready for discharge with recommendations for outpatient follow-up with nephrology, hepatology, general surgery, and chronic pain management.

## 2024-07-11 LAB — TACROLIMUS LEVEL, TROUGH: TACROLIMUS, TROUGH: 3.2 ng/mL — ABNORMAL LOW (ref 5.0–15.0)

## 2024-07-11 MED ADMIN — aspirin chewable tablet 81 mg: 81 mg | ORAL | @ 14:00:00 | Stop: 2024-07-11

## 2024-07-11 MED ADMIN — tacrolimus (PROGRAF) capsule 0.5 mg: .5 mg | ORAL | @ 14:00:00 | Stop: 2024-07-11

## 2024-07-11 MED ADMIN — tacrolimus (PROGRAF) capsule 0.5 mg: .5 mg | ORAL | @ 02:00:00

## 2024-07-11 MED ADMIN — sevelamer (RENVELA) tablet 2,400 mg: 2400 mg | ORAL | @ 14:00:00 | Stop: 2024-07-11

## 2024-07-11 MED ADMIN — HYDROmorphone (DILAUDID) injection Syrg 0.5 mg: .5 mg | INTRAVENOUS | @ 11:00:00 | Stop: 2024-07-11

## 2024-07-11 MED ADMIN — HYDROmorphone (DILAUDID) injection Syrg 0.5 mg: .5 mg | INTRAVENOUS | @ 03:00:00 | Stop: 2024-07-15

## 2024-07-11 MED ADMIN — HYDROmorphone (DILAUDID) injection Syrg 0.5 mg: .5 mg | INTRAVENOUS | @ 08:00:00 | Stop: 2024-07-11

## 2024-07-11 MED ADMIN — cholecalciferol (vitamin D3 25 mcg (1,000 units)) tablet 50 mcg: 50 ug | ORAL | @ 14:00:00 | Stop: 2024-07-11

## 2024-07-11 MED ADMIN — rifAXIMin (XIFAXAN) tablet 550 mg: 550 mg | ORAL | @ 14:00:00 | Stop: 2024-07-11

## 2024-07-11 MED ADMIN — rifAXIMin (XIFAXAN) tablet 550 mg: 550 mg | ORAL | @ 02:00:00 | Stop: 2024-07-13

## 2024-07-11 MED ADMIN — ursodiol (ACTIGALL) capsule 600 mg: 600 mg | ORAL | @ 02:00:00

## 2024-07-11 MED ADMIN — ursodiol (ACTIGALL) capsule 600 mg: 600 mg | ORAL | @ 14:00:00 | Stop: 2024-07-11

## 2024-07-11 MED ADMIN — gabapentin (NEURONTIN) capsule 300 mg: 300 mg | ORAL | @ 02:00:00

## 2024-07-11 MED ADMIN — mycophenolate (CELLCEPT) capsule 250 mg: 250 mg | ORAL | @ 02:00:00

## 2024-07-11 MED ADMIN — mycophenolate (CELLCEPT) capsule 250 mg: 250 mg | ORAL | @ 14:00:00 | Stop: 2024-07-11

## 2024-07-11 MED ADMIN — insulin lispro (HumaLOG) injection CORRECTIONAL 0-20 Units: 0-20 [IU] | SUBCUTANEOUS | @ 18:00:00 | Stop: 2024-07-11

## 2024-07-11 MED ADMIN — insulin lispro (HumaLOG) injection CORRECTIONAL 0-20 Units: 0-20 [IU] | SUBCUTANEOUS | @ 02:00:00

## 2024-07-11 MED ADMIN — cyclobenzaprine (FLEXERIL) tablet 10 mg: 10 mg | ORAL | @ 02:00:00

## 2024-07-11 MED ADMIN — cyclobenzaprine (FLEXERIL) tablet 10 mg: 10 mg | ORAL | @ 14:00:00 | Stop: 2024-07-11

## 2024-07-11 MED ADMIN — pantoprazole (Protonix) EC tablet 40 mg: 40 mg | ORAL | @ 14:00:00 | Stop: 2024-07-11

## 2024-07-11 MED ADMIN — apixaban (ELIQUIS) tablet 5 mg: 5 mg | ORAL | @ 02:00:00

## 2024-07-11 MED ADMIN — apixaban (ELIQUIS) tablet 5 mg: 5 mg | ORAL | @ 14:00:00 | Stop: 2024-07-11

## 2024-07-11 MED ADMIN — insulin glargine (LANTUS) injection BASAL 30 Units: 30 [IU] | SUBCUTANEOUS | @ 02:00:00

## 2024-07-11 MED ADMIN — melatonin tablet 3 mg: 3 mg | ORAL | @ 06:00:00 | Stop: 2024-07-11

## 2024-07-11 NOTE — Plan of Care (Signed)
 Shift Summary  High blood glucose was recorded at midday, but no correctional insulin  was administered as the calculated dose was zero.   Pain resolved by mid-morning without further complaints, and independence in self-care was promoted.   Infection prevention and skin protection interventions were consistently applied, with no hospital-acquired injuries documented.   Family was updated and visited during the shift.   Overall, comfort and safety were maintained, and readiness for transition of care remained stable.     Absence of Hospital-Acquired Illness or Injury: Aseptic technique was maintained and infection prevention measures were consistently applied throughout the shift; no neuro symptoms or activity-related symptoms were documented. Skin protection interventions and absorbent pad changes were performed, and no hospital-acquired injuries were noted.     Optimal Comfort and Wellbeing: Pain was minimal at the start of the shift and resolved by mid-morning, with no further complaints; independence in self-care was encouraged.     Readiness for Transition of Care: SOFA and IRF Candidacy scores remained stable throughout the shift, and LTACH Candidacy score did not change.     Rounds/Family Conference: Family visited early in the shift and was updated on the patient's status.     Absence of Syncopal Symptoms: No syncopal symptoms or neuro symptoms were documented, and activity was performed without noted symptoms.

## 2024-07-11 NOTE — Discharge Summary (Signed)
 Physician Discharge Summary [facility]  4 BT[facility]  [address]  [place] [address]  Dept: [phone]  Loc: [phone]     Identifying Information:   [name]  1965-08-17  [id number]    Primary Care Physician: Jama Mayo, MD   Code Status: Full Code    Admit Date: 07/08/2024    Discharge Date: 07/11/2024     Discharge To: Home    Discharge Service: [facility]BR - HBR: Hospitalist [facility] APP     Discharge Attending Physician: Lestine Raeann Haws, PA    Discharge Diagnoses:  Principal Problem:    Pre-syncope (POA: Yes)  Resolved Problems:    * No resolved hospital problems. *      Outpatient Provider Follow Up Issues:   PCP: Needs to establish with primary care, has been seeing nephrology only, several issues are just outside their scope. Admitted with subacute abdo pain he relates to end of HD sessions and walking. No acute cause found.     Pain: Chronic pain in HD pt. Pain is of unclear etiology as noted below. As it occurs with HD and he will forever be on HD, would benefit from plan for chronic pain. Currently visiting ED for IV dilaudid      Hospital Course:   Refer to primary care      Richard [name] is a 59 year old male with a history of end-stage renal disease on hemodialysis, liver cirrhosis secondary to NASH status post liver transplant, coronary artery disease, hypertension, heart failure with preserved ejection fraction, and type 2 diabetes mellitus, who was admitted for evaluation of pre-syncope following a near-syncopal episode after dialysis.    PROBLEM-ORIENTED SUMMARY    Pre-syncope and Chronic Cardiac Conduction Abnormalities   He presented with dizziness and a near-syncopal episode after hemodialysis, without loss of consciousness or injury. EKG in the ED showed an ectopic atrial rhythm, bifascicular block (right bundle branch block and left anterior fascicular block), and prolonged QTc (QT interval 506 ms, QTC 645 ms, QTC Fredericia 596 ms). Troponins were negative and echocardiogram was unremarkable. Orthostatic vitals were negative during admission. He reported similar episodes after dialysis that self-resolve. Telemetry monitoring was recommended but he refused ongoing monitoring. Serial EKGs were performed and QT-prolonging medications were avoided.  Symptoms occurring after HD, likely related to fluid shifts     Hemodialysis-Related Abdominal Pain   He experienced severe, intermittent abdominal pain, typically in the last hour of dialysis and with exertion, which resolved with rest and between sessions. Pain was described as strong and heavy, occasionally radiating to the back and associated with nausea and emesis. CT abdomen/pelvis showed a small fat-containing umbilical hernia with adjacent inflammatory stranding, but pain location was above the umbilicus, in the epigastrium and RUQ. General surgery was consulted and did not recommend intervention due to lack of bowel involvement and high surgical risk. Mesenteric CTA was negative for ischemia. Nephrology and palliative care were consulted; pain was managed with IV hydromorphone  and acetaminophen , with partial relief. Hemodialysis regimen was adjusted to reduce fluid removal, and outpatient chronic pain referral was recommended.    End-Stage Renal Disease and Vascular Access Issues   He is on chronic hemodialysis via a left internal jugular tunneled catheter due to difficulty cannulating his right upper arm AV fistula. PVL duplex showed a patent fistula with strong thrill, but cannulation remained challenging due to overlying edema. Nephrology recommended cautious fluid removal and continued outpatient follow-up for vascular access management. No acute intervention was required for the catheter or  fistula during admission.    Type 2 Diabetes Mellitus with Hyperglycemia   He has poorly controlled diabetes (last A1c 10.6% on 05/25/2024). During admission, he received correctional insulin  and a reduced dose of insulin  glargine (30 units at bedtime, compared to 70 units at home). Blood glucose was monitored and managed per protocol.    Chronic Cardiac Disease   He has a history of heart failure with preserved ejection fraction (LVEF >55% on 10/2022) and coronary artery disease. No acute decompensation was noted. Daily weights and blood pressure monitoring were performed, and routine dialysis for fluid management was continued.    Chronic Liver Disease and Immunosuppression   He is status post liver transplant for NASH cirrhosis (transplant 02/2012), with history of portal hypertension and hepatic encephalopathy. He remains on immunosuppressive therapy (tacrolimus , mycophenolate  mofetil) and rifaximin , with therapeutic drug monitoring for tacrolimus . No evidence of acute rejection or hepatic decompensation during admission. Outpatient hepatology follow-up was recommended.    Anemia of Chronic Kidney Disease and Lymphocytopenia   He has chronic anemia (Hgb 11.9 g/dL) and lymphocytopenia (absolute lymphocytes 0.5 x 10^9/L), likely secondary to ESRD and immunosuppression. CBC was monitored; no acute intervention was required.    Umbilical Hernia   Imaging revealed a small fat-containing umbilical hernia with adjacent inflammatory stranding. General surgery determined no surgical intervention was needed during this admission due to lack of bowel involvement and high operative risk. The hernia did not correlate with the location of his pain.    Secondary Hypercoagulable State   He has a history of deep vein thrombosis and paroxysmal atrial fibrillation, managed with apixaban . Apixaban  was held initially for possible surgery, then resumed after surgical evaluation determined no intervention was needed.    Other Chronic Conditions   He has class 2 obesity (BMI ~36.5 kg/m^2), GERD, diabetic polyneuropathy, and first-degree AV block. These were managed per outpatient regimen and did not require acute intervention during hospitalization.    Pain Management and Palliative Care   Palliative care was consulted for chronic abdominal pain. Pain was managed with acetaminophen  and IV hydromorphone , with recommendations for oral hydromorphone  as first-line for moderate-severe pain. Chronic pain referral was recommended for outpatient management.    Hospital Course   During admission, he remained alert and oriented, tolerated hemodialysis, and had no further syncopal episodes. Pain was intermittently severe but responded to IV analgesia. No acute hospital-acquired complications occurred. He was medically ready for discharge with recommendations for outpatient follow-up with nephrology, hepatology, general surgery, and chronic pain management.     Procedures:     No admission procedures for hospital encounter.  ______________________________________________________________________  Discharge Medications:     Your Medication List        STOP taking these medications      lactulose  20 gram packet  Commonly known as: CEPHULAC             CONTINUE taking these medications      aspirin  81 MG chewable tablet  Chew 1 tablet (81 mg total) daily. Stop taking after 30 days (when you finish this bottle).     blood-glucose meter kit  Use as directed to check blood sugars     CONTOUR NEXT EZ METER Misc  Generic drug: blood-glucose meter  Use as instructed     calcitriol  0.5 MCG capsule  Commonly known as: ROCALTROL   Take 1 capsule (0.5 mcg total) by mouth 3 (three) times a week in dialysis (for use in dialysis).     CONTOUR NEXT TEST STRIPS Strp  Generic drug: blood sugar diagnostic  Use to check blood sugar as directed with insulin  3 times a day and for symptoms of high or low blood sugar.     DEXCOM G6 RECEIVER Misc  Generic drug: blood-glucose,receiver,cont  1 each by Miscellaneous route in the morning. Dispense 1 receiver annually.  Sent to [facility].     DEXCOM G7 SENSOR Devi  Generic drug: blood-glucose sensor  Use to continuously monitor blood glucose.  Change sensor every 10 days.     ELIQUIS  5 mg Tab  Generic drug: apixaban   Take 1 tablet (5 mg total) by mouth two (2) times a day.     apixaban  5 mg Tab  Commonly known as: ELIQUIS   Take 1 tablet (5 mg total) by mouth two (2) times a day.     gabapentin  300 MG capsule  Commonly known as: NEURONTIN   Take 1 capsule (300 mg total) by mouth nightly.     insulin  aspart 100 unit/mL (3 mL) injection pen  Commonly known as: NovoLOG  Flexpen U-100 Insulin   Inject 0.3 mL (30 Units total) under the skin Three (3) times a day before meals.     insulin  degludec 200 unit/mL (3 mL) Inpn  Commonly known as: TRESIBA  FLEXTOUCH U-200  Inject 0.35 mL (70 Units total) under the skin daily. Max dose of 100 units per day.     If fasting glucose is greater than 180, increase basal insulin  by 5 units. Recheck fasting glucose the following morning and continue to increase by 5 units daily until fasting sugar less than 180.     MICROLET LANCET Misc  Generic drug: lancets  Use to check blood sugar as directed with insulin  3 times a day and for symptoms of high or low blood sugar.     MOUNJARO  15 mg/0.5 mL Pnij  Generic drug: tirzepatide   Inject 15 mg under the skin every seven (7) days.     mycophenolate  250 mg capsule  Commonly known as: CELLCEPT   Take 1 capsule (250 mg total) by mouth two (2) times a day.     pantoprazole  40 MG tablet  Commonly known as: Protonix   Take 1 tablet (40 mg total) by mouth daily as needed.     PROGRAF  0.5 mg capsule  Generic drug: tacrolimus   Take 1 capsule (0.5 mg total) by mouth two (2) times a day.     rifAXIMin  550 mg Tab  Commonly known as: XIFAXAN   Take 1 tablet (550 mg total) by mouth two (2) times a day.     sevelamer  800 mg tablet  Commonly known as: RENVELA   Take 3 tablets (2,400 mg total) by mouth Three (3) times a day with a meal.     triamcinolone  0.1 % ointment  Commonly known as: KENALOG   Apply topically two (2) times a day. Apply to affected area twice daily until improved or for up to 2 weeks, whichever is sooner. Break for 1-2 weeks. Restart as needed.     TRUEPLUS PEN NEEDLE 32 gauge x 5/32 (4 mm) Ndle  Generic drug: pen needle, diabetic  Use with insulin  up to 4 times a day as needed.     TRUEPLUS PEN NEEDLE 32 gauge x 5/32 (4 mm) Ndle  Generic drug: pen needle, diabetic  Use with insulin  up to 4 times daily as needed.     ursodiol  300 mg capsule  Commonly known as: ACTIGALL   Take 2 capsules (600 mg total) by mouth two (2) times a day.  VITAMIN D3 ORAL  Take 2,000 Units by mouth in the morning.              Allergies:  Patient has no known allergies.  ______________________________________________________________________  Pending Test Results (if blank, then none):      Most Recent Labs:  All lab results last 24 hours -   Recent Results (from the past 24 hours)   POCT Glucose    Collection Time: 07/10/24  2:48 PM   Result Value Ref Range    Glucose, POC 173 70 - 179 mg/dL   ECG 12 Lead    Collection Time: 07/10/24  3:09 PM   Result Value Ref Range    EKG Systolic BP  mmHg    EKG Diastolic BP  mmHg    EKG Ventricular Rate 79 BPM    EKG Atrial Rate 79 BPM    EKG P-R Interval 248 ms    EKG QRS Duration 154 ms    EKG Q-T Interval 508 ms    EKG QTC Calculation 582 ms    EKG Calculated P Axis -44 degrees    EKG Calculated R Axis -82 degrees    EKG Calculated T Axis 2 degrees    QTC Fredericia 557 ms   POCT Glucose    Collection Time: 07/10/24  7:37 PM   Result Value Ref Range    Glucose, POC 250 (H) 70 - 179 mg/dL   POCT Glucose    Collection Time: 07/10/24  8:28 PM   Result Value Ref Range    Glucose, POC 237 (H) 70 - 179 mg/dL   Tacrolimus  Level, Trough    Collection Time: 07/11/24  6:22 AM   Result Value Ref Range    Tacrolimus , Trough 3.2 (L) 5.0 - 15.0 ng/mL   POCT Glucose    Collection Time: 07/11/24  6:56 AM   Result Value Ref Range    Glucose, POC 137 70 - 179 mg/dL   POCT Glucose    Collection Time: 07/11/24 12:12 PM   Result Value Ref Range    Glucose, POC 212 (H) 70 - 179 mg/dL       Relevant Studies/Radiology (if blank, then none):    ______________________________________________________________________  Discharge Instructions:         Follow Up instructions and Outpatient Referrals     Ambulatory Referral to Pain Clinic      Reason for referral: chronic abdominal pain related to HD    Requested follow up plan: You would evaluate and manage.            Appointments which have been scheduled for you      Jul 29, 2024 9:40 AM  (Arrive by 9:25 AM)  CTA ABDOMEN W WO CONTRAST with IC CT RM 2  RAD [facility] [address] [facility]) [address] [facility] [address]  [address] Floor  [place] [address]  [phone]   On appt date:  Drink lots of water 24 hrs  Bring recent lab work  Take meds as usual  [employer] service fast streamer of current meds  Bring snack if diabetic    Let us  know if pt:  Allergic to contrast dyes  Diabetic  Pregnant or nursing  Claustrophobic         Jul 29, 2024 10:20 AM  (Arrive by 9:50 AM)  RETURN GENERAL with Gerard Levorn Gaskins, MD  [facility] VIR CLINIC [address] [place]) [address]  [place]  [place] [address]  [phone]        Aug 19, 2024 11:00 AM  (Arrive by 10:45 AM)  RETURN HEPATOLOGY with Vena Lowers, MD  [facility] GI MEDICINE [place] [facility]) [address]  [facility] [address]  [place]  [address]  [phone]        Sep 25, 2024 2:15 PM  (Arrive by 2:00 PM)  NEW GENERAL PCP with Juliene Ceil Rogue, MD  [place] [facility]) [address]  [address]  [place] [address]  [phone]        Oct 09, 2024 12:00 PM  (Arrive by 11:30 AM)  NEW  GENERAL SURGERY with GEN AND ACUTE ATTENDING  [facility] GENERAL AND ACUTE CARE SURGERY [place] [facility]) [address]  [place] [address]  [phone]             ______________________________________________________________________  Discharge Day Services:  BP 134/83  - Pulse 74  - Temp 36.8 ??C (98.2 ??F) (Oral)  - Resp 18  - Ht 177.8 cm (5' 10)  - Wt (!) 115.6 kg (254 lb 14.4 oz)  - SpO2 98%  - BMI 36.57 kg/m??   Pt seen on the day of discharge and determined appropriate for discharge.    Condition at Discharge: stable    Length of Discharge: I spent 55 mins in the discharge of this patient.

## 2024-07-11 NOTE — Progress Notes (Signed)
 Pt's DC instructions provided, pt IV removed. Belongings returned. Pt's verbalized no further questions. Transportation is requested to transport pt out. Pt

## 2024-07-11 NOTE — Plan of Care (Signed)
 Shift Summary  Correctional insulin  was administered after persistently elevated blood glucose readings.   HYDROmorphone  was given twice for acute pain, resulting in temporary pain relief.   Fall reduction and bed safety protocols were maintained throughout the shift.   Skin protection measures, including absorbent pads and limited adhesive use, were consistently applied.   Overall, comfort and safety interventions were maintained, and no new hospital-acquired complications were documented.     Absence of Hospital-Acquired Illness or Injury: Aseptic technique was maintained throughout the shift and environmental surveillance was performed; fall reduction program and bed safety protocols were consistently applied, and absorbent pads and limited adhesive use supported skin protection. No new hospital-acquired injuries or illnesses were documented during the shift.     Optimal Comfort and Wellbeing: Pain in the lower/mid region fluctuated, peaking at 9 and decreasing to 3 after HYDROmorphone  administration, but later returned to 9; patient declined further intervention and was sleeping at the end of the shift.

## 2024-07-11 NOTE — Consults (Signed)
 Tacrolimus  Therapeutic Monitoring Pharmacy Note    Richard [name] is a 59 y.o. male continuing tacrolimus .     Indication: Liver transplant     Date of Transplant: 03/02/2012      Prior Dosing Information: Current regimen 0.5mg  Twice a day    Source(s) of information used to determine prior to admission dosing: MAR    Goals:  Therapeutic Drug Levels  Tacrolimus  trough goal: 2-4 ng/ml    Additional Clinical Monitoring/Outcomes  Monitor renal function (SCr and urine output) and liver function (LFTs)  Monitor for signs/symptoms of adverse events (e.g., hyperglycemia, hyperkalemia, hypomagnesemia, hypertension, headache, tremor)    Previous Lab Values  Tacrolimus , Trough   Date/Time Value Ref Range Status   07/11/2024 06:22 AM 3.2 (L) 5.0 - 15.0 ng/mL Final   07/09/2024 07:25 AM 3.9 (L) 5.0 - 15.0 ng/mL Final   06/24/2024 07:10 AM 3.2 (L) 5.0 - 15.0 ng/mL Final   05/26/2024 06:27 AM 1.7 (L) 5.0 - 15.0 ng/mL Final   04/17/2024 06:13 AM 3.2 (L) 5.0 - 15.0 ng/mL Final   10/06/2014 09:00 AM 6.8 <=20.0 ng/mL Final   08/31/2014 08:34 AM 4.2  Final   08/03/2014 08:22 AM 4.7  Final   07/07/2014 08:37 AM 4.2  Final   06/09/2014 08:27 AM 3.5  Final     Tacrolimus , Timed   Date/Time Value Ref Range Status   07/03/2024 01:21 PM 4.2 ng/mL Final       Result:  Tacrolimus  level from today was drawn 10 hours after last dose.      Pharmacokinetic Considerations and Significant Drug Interactions:  Concurrent CYP3A4 substrates/inhibitors: None identified    Assessment/Plan:  Recommendedation(s)  Continue current regimen of 0.5mg  twice a day      Follow-up  Next level to be determined by primary team.   A pharmacist will continue to monitor and recommend levels as appropriate    Please page service pharmacist with questions/clarifications.    DOROTHA Tanda Moats, Pharm D, BCPS, BCGP

## 2024-07-15 DIAGNOSIS — Z09 Encounter for follow-up examination after completed treatment for conditions other than malignant neoplasm: Principal | ICD-10-CM

## 2024-07-17 ENCOUNTER — Ambulatory Visit: Admit: 2024-07-17 | Discharge: 2024-07-19 | Payer: BLUE CROSS/BLUE SHIELD

## 2024-07-17 ENCOUNTER — Encounter
Admit: 2024-07-17 | Discharge: 2024-07-19 | Payer: BLUE CROSS/BLUE SHIELD | Attending: Emergency Medicine | Primary: Emergency Medicine

## 2024-07-17 ENCOUNTER — Inpatient Hospital Stay: Admit: 2024-07-17 | Discharge: 2024-07-19 | Payer: BLUE CROSS/BLUE SHIELD

## 2024-07-17 LAB — COMPREHENSIVE METABOLIC PANEL
ALBUMIN: 3.3 g/dL — ABNORMAL LOW (ref 3.4–5.0)
ALKALINE PHOSPHATASE: 146 U/L — ABNORMAL HIGH (ref 46–116)
ALT (SGPT): <7 U/L — ABNORMAL LOW (ref 10–49)
ANION GAP: 18 mmol/L — ABNORMAL HIGH (ref 5–14)
AST (SGOT): 23 U/L (ref <=34)
BILIRUBIN TOTAL: 0.6 mg/dL (ref 0.3–1.2)
BLOOD UREA NITROGEN: 20 mg/dL (ref 9–23)
BUN / CREAT RATIO: 2
CALCIUM: 9 mg/dL (ref 8.7–10.4)
CHLORIDE: 96 mmol/L — ABNORMAL LOW (ref 98–107)
CO2: 22.3 mmol/L (ref 20.0–31.0)
CREATININE: 9.09 mg/dL — ABNORMAL HIGH (ref 0.73–1.18)
EGFR CKD-EPI (2021) MALE: 6 mL/min/1.73m2 — ABNORMAL LOW (ref >=60)
GLUCOSE RANDOM: 261 mg/dL — ABNORMAL HIGH (ref 70–179)
POTASSIUM: 5.1 mmol/L — ABNORMAL HIGH (ref 3.4–4.8)
PROTEIN TOTAL: 7.1 g/dL (ref 5.7–8.2)
SODIUM: 136 mmol/L (ref 135–145)

## 2024-07-17 LAB — CBC W/ AUTO DIFF
BASOPHILS ABSOLUTE COUNT: 0 10*9/L (ref 0.0–0.1)
BASOPHILS RELATIVE PERCENT: 0.9 %
EOSINOPHILS ABSOLUTE COUNT: 0.2 10*9/L (ref 0.0–0.5)
EOSINOPHILS RELATIVE PERCENT: 3.6 %
HEMATOCRIT: 38 % — ABNORMAL LOW (ref 39.0–48.0)
HEMOGLOBIN: 12.6 g/dL — ABNORMAL LOW (ref 12.9–16.5)
LYMPHOCYTES ABSOLUTE COUNT: 1.1 10*9/L (ref 1.1–3.6)
LYMPHOCYTES RELATIVE PERCENT: 21.8 %
MEAN CORPUSCULAR HEMOGLOBIN CONC: 33.2 g/dL (ref 32.0–36.0)
MEAN CORPUSCULAR HEMOGLOBIN: 28.3 pg (ref 25.9–32.4)
MEAN CORPUSCULAR VOLUME: 85.2 fL (ref 77.6–95.7)
MEAN PLATELET VOLUME: 8.4 fL (ref 6.8–10.7)
MONOCYTES ABSOLUTE COUNT: 0.6 10*9/L (ref 0.3–0.8)
MONOCYTES RELATIVE PERCENT: 11.8 %
NEUTROPHILS ABSOLUTE COUNT: 3.1 10*9/L (ref 1.8–7.8)
NEUTROPHILS RELATIVE PERCENT: 61.9 %
NUCLEATED RED BLOOD CELLS: 0 /100{WBCs} (ref <=4)
PLATELET COUNT: 223 10*9/L (ref 150–450)
RED BLOOD CELL COUNT: 4.46 10*12/L (ref 4.26–5.60)
RED CELL DISTRIBUTION WIDTH: 15.1 % (ref 12.2–15.2)
WBC ADJUSTED: 4.9 10*9/L (ref 3.6–11.2)

## 2024-07-17 LAB — PRO-BNP: PRO-BNP: 4924 pg/mL — ABNORMAL HIGH (ref <=300.0)

## 2024-07-17 LAB — LIPID PANEL
CHOLESTEROL: 123 mg/dL (ref <200)
HDL CHOLESTEROL: 27 mg/dL — ABNORMAL LOW (ref >40)
LDL CHOLESTEROL CALCULATED: 67 mg/dL (ref <100)
NON-HDL CHOLESTEROL: 96 mg/dL (ref <130)
TRIGLYCERIDES: 202 mg/dL — ABNORMAL HIGH (ref <150)

## 2024-07-17 LAB — HIGH SENSITIVITY TROPONIN I - 2 HOUR SERIAL
HIGH SENSITIVITY TROPONIN - DELTA (0-2H): 5 ng/L (ref <=7)
HIGH-SENSITIVITY TROPONIN I - 2 HOUR: 54 ng/L (ref <=53)

## 2024-07-17 LAB — HIGH SENSITIVITY TROPONIN I - SINGLE: HIGH SENSITIVITY TROPONIN I: 55 ng/L (ref <=53)

## 2024-07-17 LAB — LACTATE, VENOUS, WHOLE BLOOD
LACTATE BLOOD VENOUS: 3.6 mmol/L — ABNORMAL HIGH (ref 0.5–1.8)
LACTATE BLOOD VENOUS: 4 mmol/L — ABNORMAL HIGH (ref 0.5–1.8)

## 2024-07-17 LAB — APTT
APTT: 30 s (ref 24.8–38.4)
HEPARIN CORRELATION: 0.2

## 2024-07-17 LAB — AMMONIA: AMMONIA: 51 umol/L — ABNORMAL HIGH (ref 11–32)

## 2024-07-17 LAB — PHOSPHORUS: PHOSPHORUS: 3.7 mg/dL (ref 2.4–5.1)

## 2024-07-17 LAB — HIGH SENSITIVITY TROPONIN I - 6 HOUR SERIAL
HIGH SENSITIVITY TROPONIN - DELTA (2-6H): 3 ng/L (ref <=7)
HIGH-SENSITIVITY TROPONIN I - 6 HOUR: 51 ng/L (ref <=53)

## 2024-07-17 LAB — MAGNESIUM: MAGNESIUM: 2.1 mg/dL (ref 1.6–2.6)

## 2024-07-17 LAB — PROTIME-INR
INR: 1.22
PROTIME: 13.9 s — ABNORMAL HIGH (ref 9.9–12.6)

## 2024-07-17 LAB — HIGH SENSITIVITY TROPONIN I - SERIAL: HIGH SENSITIVITY TROPONIN I: 59 ng/L (ref <=53)

## 2024-07-17 LAB — LIPASE: LIPASE: 22 U/L (ref 12–53)

## 2024-07-17 MED ADMIN — HYDROmorphone (PF) (DILAUDID) injection 1 mg: 1 mg | INTRAVENOUS | @ 21:00:00 | Stop: 2024-07-24

## 2024-07-17 MED ADMIN — aluminum-magnesium hydroxide-simethicone (MAALOX MAX) 80-80-8 mg/mL oral suspension: 30 mL | ORAL | @ 13:00:00 | Stop: 2024-07-17

## 2024-07-17 MED ADMIN — iohexol (OMNIPAQUE) 350 mg iodine/mL solution 100 mL: 100 mL | INTRAVENOUS | Stop: 2024-07-17

## 2024-07-17 MED ADMIN — calcitriol (ROCALTROL) capsule 0.5 mcg: .5 ug | ORAL

## 2024-07-17 MED ADMIN — sevelamer (RENVELA) tablet 2,400 mg: 2400 mg | ORAL

## 2024-07-17 MED ADMIN — gentamicin-sodium citrate lock solution in NS: 2.1 mL | @ 20:00:00

## 2024-07-17 MED ADMIN — tacrolimus (PROGRAF) capsule 0.5 mg: .5 mg | ORAL

## 2024-07-17 MED ADMIN — cholecalciferol (vitamin D3 25 mcg (1,000 units)) tablet 50 mcg: 50 ug | ORAL

## 2024-07-17 MED ADMIN — heparin (porcine) 1000 unit/mL injection 2,000 Units: 2000 [IU] | @ 16:00:00

## 2024-07-17 MED ADMIN — aspirin chewable tablet 81 mg: 81 mg | ORAL

## 2024-07-17 MED ADMIN — morphine injection 2 mg: 2 mg | INTRAVENOUS | @ 13:00:00 | Stop: 2024-07-17

## 2024-07-17 MED ADMIN — HYDROmorphone (DILAUDID) injection Syrg 0.5 mg: .5 mg | INTRAVENOUS | @ 17:00:00 | Stop: 2024-07-24

## 2024-07-17 MED ADMIN — sucralfate (CARAFATE) oral suspension: 1 g | ORAL | @ 13:00:00 | Stop: 2024-07-17

## 2024-07-17 MED ADMIN — morphine 4 mg/mL injection 4 mg: 4 mg | INTRAVENOUS | @ 13:00:00 | Stop: 2024-07-17

## 2024-07-17 MED ADMIN — ondansetron (ZOFRAN) injection 4 mg: 4 mg | INTRAVENOUS | @ 13:00:00 | Stop: 2024-07-17

## 2024-07-17 NOTE — H&P (Addendum)
 [facility] Medicine   History and Physical       Assessment and Plan     Richard [name] is a 59 y.o. 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     Chest pain, mild troponin elevation, episodes of lightheadedness-patient had an episode of chest pain.  He is on anticoagulation at baseline and stable on room air making pulmonary embolism unlikely.  Description of pain does not sound consistent with aortic dissection.  It is possible that combination of hypertension and volume overload is precipitating episode this is not make urine.  Did deny being significantly above dry weight though.  Did consult cardiology considering risk factors for significant CAD and somewhat equivocal stress test in May 2024.  Cardiology discussed with the patient and decided for medical management for consideration of cath if symptoms are ongoing.  In setting of recent echocardiogram last hospitalization, will not order repeat at this time.  Addendum: Episode of severe chest pain after HD.  I have reached out to cardiology to let them know.  If persistent request n.p.o. for possible heart cath in the morning.  -Obtain stat CT aortic dissection protocol with recurrent chest pain now radiating to the back  - Cardiology consulted appreciate recommendations and to follow-up outpatient and consider cath if symptoms ongoing, they will request outpatient follow-up  - Monitor on telemetry  -Start carvedilol  3.125 per cardiology's recommendation, would uptitrate if BP tolerates  -Continue aspirin  and apixaban   - Restart atorvastatin  40 mg nightly (unclear why stopped)    Abdominal pain exacerbated by exertion, fluid shifts umbilical hernia-received extensive evaluation last hospitalization for this.  Evaluation has included several CTs of the abdomen and pelvis, CT mesenteric ischemia protocol, EGD in late July, surgery evaluation.  Surgery saw him last admission and did not recommend surgery for hernia considering small size, lack of bowel involvement and comorbid conditions.  So far, there is not a clear etiology of the symptoms.  Does report symptoms are exacerbated by stress.  They question if it could be a anginal equivalent see discussion above about further cardiac testing.  The fact that the pain is not worsened by food intake makes mesenteric ischemia less likely. SIBO felt less likely since patient is on chronic rifaximin .  Overall,since symptoms are stable and improving this admission recently received an extensive evaluation last admissionand  will not pursue further abdominal imaging at this time as long as remains clinically stable without changes in abd exam.  -Addendum: Lactate came back elevated at 3.6.  Per review has had some chronic lactate elevations in the past.  In the setting of severe chest pain with concurrent abdominal pain and elevated lactate will obtain CTA aortic dissection protocol to ensure no significant chest or abdominal abnormality present.  - Continue supportive care for this  - Pain control with scheduled Tylenol , as needed oxycodone  , hydromorphone  breakthrough pain, palliative care was consulted last admission  - Will try to encourage limiting opioid pain medications in the setting of chronic pain    Splenic artery aneurysm, possible stent occlusion-underwent recent stenting of 2.6 cm hilar splenic artery aneurysm on 12/29.  Last CT scan on 113 showed lack of opacification of the stent  concerning for possible stent occlusion.  -CTN aspirin /apixiban   - Will reach out to [facility] to discuss     Lactic acidosis-after admission lactate came back elevated at 3.6 after HD.  Per review had some mildly elevated lactate last hospitalization (2.1-3.3).  Wonder if this is a response of the body with poor tolerance to HD where the shunting of fluids during HD causes relative hypoperfusion to other tissues leading to this phenomenon.  -Although lower suspicion, will send blood cultures, will not plan to start antibiotics at this time unless clinically decompensates  -Will recheck lactate level in 2 hours  -Will give-IV thiamine  in case has relative thiamine  deficiency in the setting of GLP-1 and decreased intake  - Obtaining CT as above to ensure no acute abdominal pathology present driving elevated lactic acidosis     [name] Cirrhosis s/p OLT (02/2012)  Established with [facility] (Dr. Lennette). No history of rejection or biliary issues. however has had multiple prior admissions for hepatic encephalopathy 01/2024 MR elastography with stage 3 fibrosis.  -Would discuss with GI if decompensation occurs   - Continue tacrolimus  0.5mg  BID (goal 2-4)   - Follow up tac level in the AM   - Continue cellcept  250mg  BID  - Continue ursodiol  600mg  BID   - Continue rifaximin  BID    ESRD on iHD TTS - Hyperkalemia   Etiology of ESRD 2/2 CNI toxicity and HTN.  Is currently dialyzing through left tunnel internal jugular  iHD line.  Has AV fistula but increased swelling  around site after infiltration events and now dialyzing through tunneled line. PVL duplex earlier in January showed patent fistula but cannulation remain challenging due to edema last hospitalization.   -Repeat duplex of the arm with increased swelling around fistula site  - Nephrology consulted for HD management  - Continue sevelemer 800mg  TID   - Continue vitamin D supplement   -Planned      T2DM  Patient reports sliding scale for both his long acting and meal time insulin .  Not adequately controlled at baseline with last hemoglobin A1c of 10.6 (04/2024) Reports more recently he takes about 30 units of long-acting insulin , between 10 to 30 units with meals based on blood sugars, mounjaro  15mg  weekly.   - Basal: 20U lantus    - Nutritional: 10U lispro   - Correctional: SSI   - POC ACHS    Prolonged QTc-in the setting of bundle branch block  - Minimize QTc prolonging agents as able    Prophylaxis  -Apixiban     Diet  -Nutrition Therapy Regular/House; Fluid 2000 ml    Code Status / HCDM  -Full Code,    -  HCDM (patient stated preference): [name] - Sister - [phone]    Anticipated Medically Ready for Discharge: Anticipated in 2-4 Days    Significant Comorbid Conditions:  -Anemia POA requiring further investigation or monitoring  -Chronic kidney disease POA requiring further investigation, treatment, or monitoring  -Fatty Liver POA requiring dietary guidance and education  -Hyperkalemia POA requiring further investigation, treatment, or monitoring    Issues Impacting Complexity of Management:  -High risk of complications from pain and/or analgesia likely to result in delirium    I personally spent greater than 75 minutes face-to-face and non-face-to-face in the care of this patient, which includes all pre, intra, and post visit time on the date of service.  All documented time was specific to the E/M visit and does not include any procedures that may have been performed.  HPI      Richard [name] is a 59 y.o. 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     The patient was last discharged on January 16 for dizziness and near syncopal episode after HD with EKG showing bifascicular block and prolonged QTc.  At that time troponins was negative and EKG was unremarkable.  This was felt to be related to fluid shifts from dialysis.    On this admission he reports that he noted discomfort in his heart and difficulty breathing which brought him in.  He reports that he woke up around 3 AM with chest discomfort in his left upper chest.  He has a hard time describing it but maybe it felt like pressure.  This lasted until about an hour ago when he received pain medications.  He also noted that he had numbness in his hands and just felt off and did not feel right.  He denies shoulder or back pain or tearing or ripping sensation.  He reports compliance with dialysis and last received it Tuesday.  He also noted a little bit if shortness of breath.  He went to dialysis and reported symptoms and was redirected to the emergency department.  At baseline, he denies chest pain or discomfort with movement.  He has been told a couple of times he has a high blood pressure prior to HD that he reports that he will often get lower in HD.  He reports that he is taking a blood pressure medication at home although does not have recent blood pressures dispense.  He is not sure the name.  He does say that he does not take it prior to dialysis.  He reports at baseline he could walk about a lap around a grocery store.  He has noted a few more episodes of feeling lightheaded like he did when he was hospitalized for the presyncopal episodes.  He often has to get a wheelchair due to the abdominal pain he gets with exertion.  Does not generally get shortness of breath if he walks around too much.    He has also been noted t abdominal pain which last a few hours after dialysis and resolves with rest in between sessions.  Please see past discussions for extensive evaluation of this.  During last admission, CT abdomen pelvis showed suspect containing umbilical hernia with inflammatory stranding. Intervention was not recommended by general surgery. Also received CT mesenteric ischemia protocol that did not show signs of acute ischemia.  There was concern that the stent in the splenic artery was not opacified with contrast.  During last admission palliative care saw him due to uncontrolled pain.  Further history of this pain he reports that he often will get it when he is walking or just when he is standing up for a long period of time such as shaving.  Sometimes he will associate vomiting with the pain and it feels better with not moving and lying down.  He also will get  the pain after HD.  He reports that prior to today had not had the abdominal pain since last time he was hospitalized.  He got the abdominal pain again after the chest pain started but improved with pain medications.  He he reports regular bowel movements without blood  in his bowel movements.  His last EGD was July 31 which showed small varices diminutive in size and large amount of food in the gastric body.  He denies his abdominal symptoms worsening after eating food.  He also denies worsening after starting the GLP-1.    No fevers, diarrhea, night sweats, other infectious symptoms noted.    In the emergency department: Mildly hypertensive, high-sensitivity troponins mildly elevated at 59 and then down trended to 50, For lipase normal proBNP 4924, decreased from prior levels labs showing mild potassium elevation of 5.1, glucose of 261 normal WBC, hemoglobin of 12.6, normal platelets,   EKG RBB normal ventricular rate and regular   Received morphine  4mg  and 2mg  and zofran , carafate    Seen in HD and comfortable      Med Rec Confidence   I reviewed the Medication List. The current list is Accurate  Patient reports taking a blood pressure medication but is not sure of the name.  He reports that he has been out of his home aspirin  for 1 week but was taking it prior to this.  Reports compliance with other medications.    Physical Exam   Temp:  [35.9 ??C (96.6 ??F)-36.4 ??C (97.5 ??F)] 36.1 ??C (97 ??F)  Pulse:  [69-114] 104  SpO2 Pulse:  [83-105] 104  Resp:  [17-24] 17  BP: (105-169)/(65-116) 105/65  SpO2:  [99 %-100 %] 100 %  Body mass index is 36.57 kg/m??.  GEN: NAD,  sitting in HD care chair  EYES: EOMI, No conjunctival icterus or injectionCV: RRR, no murmurs appreciated, getting dialysis through tunneled line in chest, no significant erythema around line  PULM: CTA B, normal work of breathing  ABD: soft, prior surgical scar, no external hernia seen on exam, mid abdominal tenderness to palpation without peritonitis, bowel sounds active  EXT: Fistula site on right arm with thrill and bruit present, patient notes worsening swelling around site, extremities are perfused, tattoos on skin  NEURO: No focal deficits  PSYCH: Intermittently anxious  GU: No CVA tenderness

## 2024-07-17 NOTE — Consults (Signed)
 Cardiology Consult Note    PCP: Jama Mayo, MD  Requesting MD: Clem Jenkins Jansky, MD   Reason for consult: Chest Pain    Cardiology Assessment & Plan:    52M PMHx CAD (nuclear stress test 2024), paroxysmal AF on apixaban , HTN, HFpEF, obesity, [name] cirrhosis s/p liver transplant 2013, ESRD 2/2 CNI toxicity on HD, recent splenic artery aneurysm stenting p/w CP and SOB.     #Chest Pain  #Shortness of Breath  #CAD  #HFpEF  Symptoms were nonexertional and may be due to volume overload prior to dialysis session though cannot rule out angina from known CAD. Low suspicion for ACS with troponin being mildly elevated and stable.   - On ASA 81mg  daily for recent splenic artery aneurysm stent though not needed for CAD since he is on [facility] for AF  - Start high intensity statin  - Start low dose carvedilol  for anti-anginal effect  - If patient has recurring symptoms after HD or as an outpatient, will consider cath for ischemic evaluation, will arrange follow up at [facility] at [facility]    #AF  EKG with ectopic atrial rhythm  - Apixaban  5mg  q12      History of Present Illness:     52M PMHx CAD (nuclear stress test 2024), paroxysmal AF on apixaban , HTN, HFpEF, obesity, [name] cirrhosis s/p liver transplant 2013, ESRD 2/2 CNI toxicity on HD, recent splenic artery aneurysm stenting p/w CP and SOB.     Patient had chest pain overnight and it resolved in the morning. He went to HD and felt SOB so he was sent to the hospital. Patient has not had CP or SOB recently. He does have chronic intermittent severe abdominal pain which was previously attributed to umbilical hernia. Patient had recent splenic artery aneurysm stenting and is on aspirin . He is mostly sedentary.    SPECT MPI 05/2023 showed moderate sized, mild in severity, fixed defect in the anterior and apical segments which may represent artifact or scar. Coronary artery calcification was noted. TTE 07/09/24 showed LVEF 65-70% without significant valve pathology. EKG 07/17/24 showed ectopic atrial rhythm, RBBB, LAFB. Trop 59 -> 54.     Allergies[1]    Medications:   Prior to Admission medications   Medication Sig Start Date End Date Taking? Authorizing Provider   apixaban  (ELIQUIS ) 5 mg Tab Take 1 tablet (5 mg total) by mouth two (2) times a day. 02/23/24 05/18/25  Baez Marinez, Lazarina D, FNP   apixaban  (ELIQUIS ) 5 mg Tab Take 1 tablet (5 mg total) by mouth two (2) times a day. 06/24/24   Somera, Cherie Ann, FNP   aspirin  81 MG chewable tablet Chew 1 tablet (81 mg total) daily. Stop taking after 30 days (when you finish this bottle). 06/24/24 07/24/24  Somera, Cherie Ann, FNP   blood sugar diagnostic (GLUCOSE BLOOD) Strp Use to check blood sugar as directed with insulin  3 times a day and for symptoms of high or low blood sugar. 04/18/24   Leonce Lacks, MD   blood-glucose meter kit Use as directed to check blood sugars 04/18/24 04/18/25  Leonce Lacks, MD   blood-glucose meter kit Use as instructed 04/18/24 04/18/25  Leonce Lacks, MD   blood-glucose meter,continuous (DEXCOM G6 RECEIVER) Misc 1 each by Miscellaneous route in the morning. Dispense 1 receiver annually.  Sent to [facility]. 04/03/23   Duggan, Patrick, MD   blood-glucose sensor (DEXCOM G7 SENSOR) Devi Use to continuously monitor blood glucose.  Change sensor every 10 days. 01/14/24  Iglesia, Delfin Abano, MD   calcitriol  (ROCALTROL ) 0.5 MCG capsule Take 1 capsule (0.5 mcg total) by mouth 3 (three) times a week in dialysis (for use in dialysis). 06/24/24 09/22/24  Somera, Cherie Ann, FNP   CHOLECALCIFEROL , VITAMIN D3, (VITAMIN D3 ORAL) Take 2,000 Units by mouth in the morning.    [provider]   gabapentin  (NEURONTIN ) 300 MG capsule Take 1 capsule (300 mg total) by mouth nightly. 10/16/23 11/09/24  Baez Marinez, Lazarina D, FNP   insulin  aspart (NOVOLOG  FLEXPEN U-100 INSULIN ) 100 unit/mL (3 mL) injection pen Inject 0.3 mL (30 Units total) under the skin Three (3) times a day before meals. 04/18/24 07/17/24  Leonce Lacks, MD   insulin  degludec (TRESIBA  FLEXTOUCH U-200) 200 unit/mL (3 mL) InPn Inject 0.35 mL (70 Units total) under the skin daily. Max dose of 100 units per day.     If fasting glucose is greater than 180, increase basal insulin  by 5 units. Recheck fasting glucose the following morning and continue to increase by 5 units daily until fasting sugar less than 180.  Patient taking differently: Inject 0.35 mL (70 Units total) under the skin daily. Max dose of 100 units per day. Less at times per patient    If fasting glucose is greater than 180, increase basal insulin  by 5 units. Recheck fasting glucose the following morning and continue to increase by 5 units daily until fasting sugar less than 180. 04/18/24 07/17/24  Leonce Lacks, MD   lancets Misc Use to check blood sugar as directed with insulin  3 times a day and for symptoms of high or low blood sugar. 04/18/24   Leonce Lacks, MD   mycophenolate  (CELLCEPT ) 250 mg capsule Take 1 capsule (250 mg total) by mouth two (2) times a day. 02/13/24   Deutsch-Link, Vena, MD   pantoprazole  (PROTONIX ) 40 MG tablet Take 1 tablet (40 mg total) by mouth daily as needed.    [provider]   pen needle, diabetic (TRUEPLUS PEN NEEDLE) 32 gauge x 5/32 (4 mm) Ndle Use with insulin  up to 4 times a day as needed. 11/21/23   Duggan, Patrick, MD   pen needle, diabetic 32 gauge x 5/32 (4 mm) Ndle Use with insulin  up to 4 times daily as needed. 04/18/24   Leonce Lacks, MD   rifAXIMin  (XIFAXAN ) 550 mg Tab Take 1 tablet (550 mg total) by mouth two (2) times a day.    [provider]   sevelamer  (RENVELA ) 800 mg tablet Take 3 tablets (2,400 mg total) by mouth Three (3) times a day with a meal. 05/28/24 08/26/24  Raguel Mallick, MD   tacrolimus  (PROGRAF ) 0.5 MG capsule Take 1 capsule (0.5 mg total) by mouth two (2) times a day. 06/09/24   Deutsch-Link, Vena, MD   tirzepatide  (MOUNJARO ) 15 mg/0.5 mL PnIj Inject 15 mg under the skin every seven (7) days. 07/01/24   Jama Mayo, MD   ursodiol  (ACTIGALL ) 300 mg capsule Take 2 capsules (600 mg total) by mouth two (2) times a day.    [provider]       Past Medical History[2]    Past Surgical History[3]    Social History:  Tobacco Use History[4]  Social History     Substance and Sexual Activity   Alcohol Use Never     Social History     Substance and Sexual Activity   Drug Use Never       Family History[5]    Code Status:  Full  Code    Review of Systems: All positive and pertinent negatives are noted in the HPI; otherwise all other systems are negative.    Objective:     Physical Exam:  Temp:  [35.9 ??C (96.6 ??F)-36.4 ??C (97.5 ??F)] 36.1 ??C (97 ??F)  Pulse:  [69-114] 104  SpO2 Pulse:  [83-105] 104  Resp:  [17-24] 17  BP: (105-169)/(65-116) 105/65  SpO2:  [99 %-100 %] 100 %  General:  Resting comfortably in NAD  Neuro:  Alert & oriented X 3.  Follows commands  Chest:  Resp even and nonlabored.  Lungs sounds clear throughout  CV:  S1S2, RRR, no murmurs, gallops or rubs  Ext:  No clubbing, cyanosis, trace edema  MSK:  Moving all extremities.  No obvious limitations or significant deficits    Labs:   Lab Results   Component Value Date    CKTOTAL 83.0 04/08/2024    CKTOTAL 264.0 (H) 08/29/2021    TROPONINI 54 (HH) 07/17/2024    TROPONINI 59 (HH) 07/17/2024    TROPONINI 33 07/08/2024     Recent Labs     Units 07/17/24  0652   WBC 10*9/L 4.9   RBC 10*12/L 4.46   HGB g/dL 87.3*   HCT % 61.9*   MCV fL 85.2   MCH pg 28.3   MCHC g/dL 66.7   RDW % 84.8   PLT 10*9/L 223   MPV fL 8.4   .  Recent Labs     Units 07/17/24  0652   NA mmol/L 136   K mmol/L 5.1*   CL mmol/L 96*   BUN mg/dL 20   CREATININE mg/dL 0.90*   GLU mg/dL 738*     Lab Results   Component Value Date    LDL Cholesterol, Calculated 85 04/15/2012    Cholesterol, LDL, Calculated 67 07/17/2024    Cholesterol, Non-HDL, Calculated 96 07/17/2024    HDL 37 (L) 03/16/2014    Cholesterol, HDL 27 (L) 07/17/2024    INR 1.22 07/17/2024    INR 1.0 05/23/2012    BNP 603 (H) 11/16/2022 [1] No Known Allergies  [2]   Past Medical History:  Diagnosis Date    CHF (congestive heart failure) (CMS-HCC)     COVID-19 07/18/2019    Diabetes mellitus (CMS-HCC)     Family history of malignant neoplasm of prostate 06/23/2015    Heart disease     Heart murmur     Hepatic cirrhosis    (CMS-HCC) 06/23/2015    Overview:  Secondary to NASH; followed by Dr. Lindaann, GI.  Last Assessment & Plan:  Relevant Hx: Course: Daily Update: Today's Plan:     Hypertension     Hypogonadism in male 02/12/2014    Kidney stone     Liver cirrhosis secondary to NASH (nonalcoholic steatohepatitis) (CMS-HCC)     s/p liver transplant 2013    Obesity    [3]   Past Surgical History:  Procedure Laterality Date    CHOLECYSTECTOMY      liver transpant      LIVER TRANSPLANTATION  06/27/2011    PR ANASTOMOSIS,AV,ANY SITE Right 02/20/2024    Procedure: ARTERIOVENOUS ANASTOMOSIS, OPEN; DIRECT, ANY SITE, UPPER EXTREMITY;  Surgeon: Marchelle Kitchens, MD;  Location: [facility] OR [facility];  Service: General Surgery    PR AV ANAST,UP ARM BASILIC VEIN TRANSPOSIT Right 03/31/2024    Procedure: ARTERIOVENOUS ANASTOMOSIS, OPEN; BY UPPER ARM BASILIC VEIN TRANSPOSITION;  Surgeon: Marchelle Kitchens, MD;  Location: [facility];  Service: General Surgery    PR AV FIST REVISE GRFT,W THROMBECTOMY Right 01/15/2023    Procedure: REVISION, ARTERIOVENOUS FISTULA W/ THROMBECTOMY, AUTOGENOUS OR NONAUTOGENOUS DIALYSIS GRAFT (SEP. PROC), LOWER EXTREMITY;  Surgeon: Marchelle Kitchens, MD;  Location: [facility] OR [facility];  Service: General Surgery    PR COLONOSCOPY FLX DX W/COLLJ SPEC WHEN PFRMD N/A 01/24/2024    Procedure: COLONOSCOPY, FLEXIBLE, PROXIMAL TO SPLENIC FLEXURE; DIAGNOSTIC, W/WO COLLECTION SPECIMEN BY BRUSH OR WASH;  Surgeon: Erick Prentice HERO, MD;  Location: [facility];  Service: Gastroenterology    PR COLONOSCOPY W/BIOPSY SINGLE/MULTIPLE N/A 08/13/2018    Procedure: COLONOSCOPY, FLEXIBLE, PROXIMAL TO SPLENIC FLEXURE; WITH BIOPSY, SINGLE OR MULTIPLE;  Surgeon: Thedora Alm Plain, MD;  Location: GI [facility];  Service: Gastroenterology    PR COLSC FLX W/RMVL OF TUMOR POLYP LESION SNARE TQ N/A 08/13/2018    Procedure: COLONOSCOPY FLEX; W/REMOV TUMOR/LES BY SNARE;  Surgeon: Thedora Alm Plain, MD;  Location: GI [facility]  Service: Gastroenterology    PR CREAT AV FISTULA,NON-AUTOGENOUS GRAFT Right 11/29/2022    Procedure: CREATE AV FISTULA (SEPARATE PROC); NONAUTOGENOUS GRAFT (EG, BIOLOGICAL COLLAGEN, THERMOPLASTIC GRAFT), UPPER EXTREMITY;  Surgeon: Marchelle Kitchens, MD;  Location: [facility] OR [facility];  Service: General Surgery    PR UPPER GI ENDOSCOPY,BIOPSY N/A 07/13/2015    Procedure: UGI ENDOSCOPY; WITH BIOPSY, SINGLE OR MULTIPLE;  Surgeon: Elspeth Jerilynn Reek, MD;  [facility]: [facility];  Service: Gastroenterology    PR UPPER GI ENDOSCOPY,BIOPSY N/A 02/13/2023    Procedure: UGI ENDOSCOPY; WITH BIOPSY, SINGLE OR MULTIPLE;  Surgeon: Minnie Krystal Claude, MD;  Location: GI [facility];  Service: Gastroenterology    PR UPPER GI ENDOSCOPY,DIAGNOSIS N/A 01/24/2024    Procedure: UGI ENDO, INCLUDE ESOPHAGUS, STOMACH, & DUODENUM &/OR JEJUNUM; DX W/WO COLLECTION SPECIMN, BY BRUSH OR WASH;  Surgeon: Erick Prentice HERO, MD;  Location: GI [facility];  Service: Gastroenterology   [4]   Social History  Tobacco Use   Smoking Status Never    Passive exposure: Past   Smokeless Tobacco Never   [5]   Family History  Problem Relation Age of Onset    Diabetes Father     Diabetes Paternal Grandfather     Liver disease Maternal Uncle     Cancer Maternal Grandmother     Melanoma Neg Hx     Basal cell carcinoma Neg Hx     Squamous cell carcinoma Neg Hx     Kidney disease Neg Hx

## 2024-07-17 NOTE — Procedures (Signed)
 ULTRASOUND PIV PROCEDURE NOTE    Indications:   Poor venous access.    Ultrasound guidance was necessary to obtain access.     Procedure Details:  Identity of the patient was confirmed via name, medical record number and date of birth. The availability of the correct equipment was verified.    The vein was identified and measured for ultrasound catheter insertion.       Vein measurement (without tourniquet):   0.23 cm   A(n) 20 gauge 1.88 catheter was selected based on the recommendations below:    [facility] Catheter/Vein Ratio Guidelines   Chart to determine PIV catheter size/length to use based on vein diameter and depth   Catheter Gauge Size (g)  22g 20g 18g   Catheter length (inches)  1.75 1.75 1.75   Catheter diameter measurement (mm) 0.9 mm 1.1 mm 1.3 mm          Minimum required vein diameter       Sonosite (cm)  0.18 cm 0.22 cm 0.26 cm                  (mm)  1.8 mm  2.2 mm  2.6 mm   Maximum vein depth  1.25 cm 1.25 cm 1.25 cm          The field was prepared with necessary supplies and equipment.  Probe cover and sterile gel utilized. Insertion site was prepped with chlorhexidineand allowed to dry.  The catheter extension was primed with normal saline.  The US  PIV was placed in the L Upper Arm with 1attempt(s). See LDA for additional details.    Catheter aspirated, 10 mL blood return present. The catheter was then flushed with 10 mL of normal saline. Insertion site cleansed, and dressing applied per manufacturer guidelines. The catheter was inserted with difficulty due to poor vasculature by Icelyn Navarrete K Marcoantonio Legault, RN.    Thank you,     Markiyah Gahm K Jakolby Sedivy, RN    Ultrasound Resource Nurse    Workup / Procedure Time:  30 minutes

## 2024-07-17 NOTE — Consults (Signed)
 Tacrolimus  Therapeutic Monitoring Pharmacy Note    Richard [name] is a 59 y.o. male continuing tacrolimus .     Indication: Liver transplant     Date of Transplant: 03/02/2012      Prior Dosing Information: Current regimen tacrolimus  (PROGRAF ) capsule 0.5 mg PO twice daily      Source(s) of information used to determine prior to admission dosing: Fill History or MAR    Goals:  Therapeutic Drug Levels  Tacrolimus  trough goal: 2-4 ng/ml    Additional Clinical Monitoring/Outcomes  Monitor renal function (SCr and urine output) and liver function (LFTs)  Monitor for signs/symptoms of adverse events (e.g., hyperglycemia, hyperkalemia, hypomagnesemia, hypertension, headache, tremor)    Previous Lab Values  Tacrolimus , Trough   Date/Time Value Ref Range Status   07/11/2024 06:22 AM 3.2 (L) 5.0 - 15.0 ng/mL Final   07/09/2024 07:25 AM 3.9 (L) 5.0 - 15.0 ng/mL Final   06/24/2024 07:10 AM 3.2 (L) 5.0 - 15.0 ng/mL Final   05/26/2024 06:27 AM 1.7 (L) 5.0 - 15.0 ng/mL Final   04/17/2024 06:13 AM 3.2 (L) 5.0 - 15.0 ng/mL Final   10/06/2014 09:00 AM 6.8 <=20.0 ng/mL Final   08/31/2014 08:34 AM 4.2  Final   08/03/2014 08:22 AM 4.7  Final   07/07/2014 08:37 AM 4.2  Final   06/09/2014 08:27 AM 3.5  Final     Tacrolimus , Timed   Date/Time Value Ref Range Status   07/03/2024 01:21 PM 4.2 ng/mL Final       Result:  Tacrolimus  level from 07/11/24 was 3.2 ng/mL     Pharmacokinetic Considerations and Significant Drug Interactions:  Concurrent CYP3A4 substrates/inhibitors: None identified    Assessment/Plan:  Recommendedation(s)  Continue current regimen of tacrolimus  (PROGRAF ) capsule 0.5 mg PO twice daily      Follow-up  Next level has been ordered on 07/18/24 at 0700 am.   A pharmacist will continue to monitor and recommend levels as appropriate    Please page service pharmacist with questions/clarifications.    Camellia Ester, RPh

## 2024-07-17 NOTE — Plan of Care (Signed)
 4 hours Hd today with uf goal of 2-3 liters  Problem: Hemodialysis  Goal: Safe, Effective Therapy Delivery  Outcome: Shift Focus  Goal: Effective Tissue Perfusion  Outcome: Shift Focus  Goal: Absence of Infection Signs and Symptoms  Outcome: Shift Focus

## 2024-07-17 NOTE — Procedures (Signed)
 HEMODIALYSIS NURSE PROCEDURE NOTE       Treatment Number:  1 Room / Station:  1    Procedure Date:  07/17/2024 Device Name/Number: Daisy    Total Dialysis Treatment Time:  242 Min.    CONSENT:    Written consent was obtained prior to the procedure and is detailed in the medical record.  Prior to the start of the procedure, a time out was taken and the identity of the patient was confirmed via name, medical record number and date of birth.     WEIGHT:   Date/Time Pre-Treatment Weight (kg) Estimated Dry Weight (kg) Patient Goal Weight (kg) Total Goal Weight (kg)    07/17/24 1113 115.2 kg (253 lb 15.5 oz)  115 kg (253 lb 8.5 oz)  2.5 kg (5 lb 8.2 oz)  3.05 kg (6 lb 11.6 oz)        Date/Time Post-Treatment Weight (kg) Treatment Weight Change (kg)    07/17/24 1523 112.4 kg (247 lb 12.8 oz)  -2.81 kg      Active Dialysis Orders (168h ago, onward)       Start     Ordered    07/17/24 1012  Hemodialysis inpatient  Every Tue,Thu,Sat      Question Answer Comment   Patient HD Status: Chronic    New Start? No    K+ 2 meq/L    Ca++ 2.5 meq/L    Bicarb 35 meq/L    Na+ 137 meq/L    Na+ Modeling no    Dialyzer [id number]    Dialysate Temperature (C) 36.5    BFR-As tolerated to a maximum of: 400 mL/min    DFR 600 mL/min    Duration of treatment 4 Hr    Dry weight (kg) 115    Challenge dry weight (kg) yes    Fluid removal (L) 2-3L    Tubing Adult = 142 ml    Access Site Dialysis Catheter    Access Site Location Right    Needle size 15    Button hole (for needle size #15 ONLY) No        07/17/24 1011                  ASSESSMENT:  General appearance: alert  Neurologic: Grossly normal  Lungs: clear to auscultation bilaterally  Heart: regular rate and rhythm, S1, S2 normal, no murmur, click, rub or gallop    ACCESS SITE:       Hemodialysis Catheter 05/20/24 Left Internal jugular 2.1 mL 2.1 mL (Active)   Site Assessment Clean;Dry;Intact 07/17/24 1536   Proximal Lumen Status / Patency Gentamicin  Citrate Locked 07/17/24 1536   Proximal Lumen Intervention Deaccessed 07/17/24 1536   Medial Lumen Status / Patency Gentamicin  Citrate Locked 07/17/24 1536   Medial Lumen Intervention Deaccessed 07/17/24 1536   Dressing Intervention No intervention needed 07/17/24 1536   Dressing Status      Clean;Dry;Intact/not removed 07/17/24 1536   Verification by X-ray Yes 07/17/24 1536   Site Condition No complications 07/17/24 1536   Dressing Type CHG gel;Occlusive 07/17/24 1536   Dressing Change Due 07/24/24 07/17/24 1536   Line Necessity Reviewed? Y 07/17/24 1536   Line Necessity Indications Yes - Hemodialysis 07/17/24 1536   Line Necessity Reviewed With Nephrology 07/17/24 1536        Arteriovenous Fistula - Vein Graft  Access Arteriovenous fistula Right;Upper Arm (Active)   Site Assessment Dry;Intact 07/11/24 1015   AV Fistula Thrill Present 07/11/24 1015   Status  Other (Comment) 07/11/24 1015   Dressing Intervention No intervention needed 07/11/24 1015   Dressing Status      Clean;Dry;Intact/not removed 07/11/24 1015   Site Condition No complications 07/11/24 1015   Dressing Gauze 07/11/24 1015   Dressing To Be Removed (Date/Time) @1500  07/10/24 07/10/24 0945     Catheter fill volumes:    Arterial: 2.1 mL Venous: 2.1 mL   Catheter filled with Gentamicin  citrate locks post procedure.     Patient Lines/Drains/Airways Status       Active Peripheral & Central Intravenous Access       Name Placement date Placement time Site Days    Peripheral IV 07/08/24 Anterior;Left;Proximal Forearm 07/08/24  1500  Forearm  9    Peripheral IV 07/17/24 Anterior;Left;Upper Arm 07/17/24  0644  Arm  less than 1    Hemodialysis Catheter 05/20/24 Left Internal jugular 2.1 mL 2.1 mL 05/20/24  1611  Internal jugular  57                   LAB RESULTS:  Lab Results   Component Value Date    NA 136 07/17/2024    K 5.1 (H) 07/17/2024    CL 96 (L) 07/17/2024    CO2 22.3 07/17/2024    BUN 20 07/17/2024    CREATININE 9.09 (H) 07/17/2024    GLU 261 (H) 07/17/2024    GLUF 138 10/06/2014    CALCIUM  9.0 07/17/2024    CAION 3.67 (L) 02/02/2024    ICAV 4.88 03/04/2012    PHOS 3.7 07/17/2024    MG 2.1 07/17/2024    PTH 943.9 (H) 05/26/2024    IRON 95 11/09/2022    LABIRON 36 11/09/2022    TRANSFERRIN 212.0 (L) 11/09/2022    FERRITIN 169.8 11/09/2022    TIBC 267.1 11/09/2022     Lab Results   Component Value Date    WBC 4.9 07/17/2024    HGB 12.6 (L) 07/17/2024    HCT 38.0 (L) 07/17/2024    PLT 223 07/17/2024    PHART 7.36 03/04/2012    PO2ART 88 03/04/2012    PCO2ART 43 03/04/2012    HCO3ART 23.4 03/04/2012    BEART -1.3 03/04/2012    O2SATART 98.1 03/04/2012    PTINR : 05/06/2012    APTT 30.0 07/17/2024        VITAL SIGNS:    Date/Time Temp Temp src       07/17/24 1524 36.1 ??C (97 ??F)  Temporal      07/17/24 1111 35.9 ??C (96.6 ??F)  Temporal        Date/Time Pulse BP MAP (mmHg) Patient Position    07/17/24 1524 --  --  --  Sitting     07/17/24 1519 104  105/65  --  Sitting     07/17/24 1500 97  129/84  --  Sitting     07/17/24 1430 103  122/92  --  Sitting     07/17/24 1400 79  112/82  --  Sitting     07/17/24 1330 100  129/102  --  Sitting     07/17/24 1300 69  135/104  --  Sitting     07/17/24 1230 114  145/112  --  --     07/17/24 1215 106  151/94  --  --     07/17/24 1200 99  135/101  --  --     07/17/24 1130 108  157/106  --  --     07/17/24  1117 100  167/116  --  --     07/17/24 1111 97  160/113  --  --     07/17/24 1101 85  147/92  --  --       Date/Time Blood Volume Change (%) HCT HGB Critline O2 SAT %    07/17/24 1519 -12.3 %  45.7  15.5  58.1     07/17/24 1500 -11.4 %  45.2  15.4  65.2     07/17/24 1430 -10.4 %  44.8  15.2  61.8     07/17/24 1400 -10.5 %  44.8  15.2  56.1     07/17/24 1330 -5.3 %  42.3  14.4  69.6     07/17/24 1300 -7.6 %  43.4  14.7  53.3     07/17/24 1230 -8.5 %  43.8  14.9  63.1     07/17/24 1215 -7.1 %  43.1  14.7  64.5     07/17/24 1200 -6.7 %  43  14.6  63.3       Date/Time Resp SpO2 O2 Device O2 Flow Rate (L/min)    07/17/24 1524 17  --  --  --     07/17/24 1519 18  --  None (Room air)  --     07/17/24 1500 18  --  None (Room air)  --     07/17/24 1430 17  --  None (Room air)  --     07/17/24 1400 18  --  None (Room air)  --     07/17/24 1330 18  --  None (Room air)  --     07/17/24 1300 17  --  None (Room air)  --     07/17/24 1230 18  --  None (Room air)  --     07/17/24 1215 17  --  None (Room air)  --     07/17/24 1200 19  --  None (Room air)  --     07/17/24 1130 18  --  None (Room air)  --     07/17/24 1117 19  --  None (Room air)  --     07/17/24 1111 18  --  None (Room air)  --     07/17/24 1101 20  100 %  --  --         Date/Time Therapy Number Dialyzer Hemodialysis Line Type All Machine Alarms Passed    07/17/24 1113 1  F-180 (98 mLs)  Adult (142 m/s)  Yes       Date/Time Air Detector Saline Line Double Clampled Hemo-Safe Applied Dialysis Flow (mL/min)    07/17/24 1113 Engaged  --  --  600 mL/min       Date/Time Verify Priming Solution Priming Volume Hemodialysis Independent pH Hemodialysis Machine Conductivity (mS/cm)    07/17/24 1113 0.9% NS  300 mL  --  13.6 mS/cm       Date/Time Hemodialysis Independent Conductivity (mS/cm) Bicarb Conductivity Residual Bleach Negative Total Chlorine    07/17/24 1113 13.6 mS/cm  -- Yes  0       Date/Time Pre-Hemodialysis Comments    07/17/24 1113 Alert, arrived in [facility] from ED       Date/Time Blood Flow Rate (mL/min) Arterial Pressure (mmHg) Venous Pressure (mmHg) Transmembrane Pressure (mmHg)    07/17/24 1519 350 mL/min  -224 mmHg  163 mmHg  -1 mmHg     07/17/24 1515 --  --  --  --  07/17/24 1500 350 mL/min  -220 mmHg  158 mmHg  5 mmHg     07/17/24 1430 350 mL/min  -216 mmHg  164 mmHg  2 mmHg     07/17/24 1400 350 mL/min  -212 mmHg  160 mmHg  9 mmHg     07/17/24 1330 350 mL/min  -198 mmHg  139 mmHg  18 mmHg     07/17/24 1300 350 mL/min  -206 mmHg  147 mmHg  -6 mmHg     07/17/24 1230 400 mL/min  -206 mmHg  154 mmHg  4 mmHg     07/17/24 1215 400 mL/min  -253 mmHg  173 mmHg  -5 mmHg     07/17/24 1200 400 mL/min  -238 mmHg  174 mmHg  -5 mmHg 07/17/24 1130 400 mL/min  -222 mmHg  168 mmHg  1 mmHg     07/17/24 1117 400 mL/min  -210 mmHg  160 mmHg  33 mmHg     07/17/24 1111 450 mL/min  38 mmHg  71 mmHg  40 mmHg       Date/Time Ultrafiltration Rate (mL/hr) Ultrafiltrate Removed (mL) Dialysate Flow Rate (mL/min) KECN (Kecn)    07/17/24 1519 970 mL/hr  3050 mL  600 ml/min  --     07/17/24 1515 --  --  600 ml/min (P)   --     07/17/24 1500 970 mL/hr  2704 mL  600 ml/min  --     07/17/24 1430 970 mL/hr  2224 mL  600 ml/min  --     07/17/24 1400 970 mL/hr  1743 mL  600 ml/min  --     07/17/24 1330 970 mL/hr  1262 mL  600 ml/min  --     07/17/24 1300 0 mL/hr  1222 mL  600 ml/min  --     07/17/24 1230 760 mL/hr  880 mL  600 ml/min  --     07/17/24 1215 760 mL/hr  697 mL  600 ml/min  --     07/17/24 1200 760 mL/hr  509 mL  600 ml/min  --     07/17/24 1130 760 mL/hr  144 mL  600 ml/min  --     07/17/24 1117 0 mL/hr  100 mL  600 ml/min  --     07/17/24 1111 0 mL/hr  95 mL  500 ml/min  --       Date/Time Intra-Hemodialysis Comments    07/17/24 1519 treatment termianted, rinse back given     07/17/24 1500 pt sleeping, VSS     07/17/24 1430 pt stable , on phone     07/17/24 1400 pt tolerating tx, cardiologist at chair side talking with pt     07/17/24 1330 uf resumed per pts request, pt talking on the phone now     07/17/24 1300 pt request for uf to be paused, uf paused     07/17/24 1230 350 bfr due to high Ap alarms     07/17/24 1200 pt sleeping     07/17/24 1130 pt tolerating tx     07/17/24 1117 tratmnt initiated via cvc       Date/Time Rinseback Volume (mL) On Line Clearance: spKt/V Total Liters Processed (L/min) Dialyzer Clearance    07/17/24 1523 300 mL  0.76 spKt/V  78.8 L/min  Moderately streaked       Date/Time Post-Hemodialysis Comments    07/17/24 1523 Alert anbd stable       Date/Time Total Hemodialysis Replacement Volume (mL) Total Ultrafiltrate Output (  mL)    07/17/24 1523 --  2500 mL        OTF-OTF - Medicaitons Given During Treatment  (last 5 hrs) Colin Norment, RN         Medication Name Action Time Action Route Rate Dose User     HYDROmorphone  (DILAUDID ) injection Syrg 0.5 mg (Or Linked Group #1) 07/17/24 1218 Given Intravenous  0.5 mg Maxine Huynh, RN     HYDROmorphone  (PF) (DILAUDID ) injection 1 mg (Or Linked Group #1) 07/17/24 1218 See Alternative Intravenous   Thuy Atilano, RN     gentamicin -sodium citrate  lock solution in NS 07/17/24 1519 Given Intra-cannular  2.1 mL Jeannie Patches, RN     gentamicin -sodium citrate  lock solution in NS 07/17/24 1519 Given Intra-cannular  2.1 mL Jeannie Patches, RN     heparin  (porcine) 1000 unit/mL injection 2,000 Units 07/17/24 1126 Given Dialysis Circuit  2,000 Units Jeannie Patches, RN                      Patient tolerated treatment in a  Dialysis Recliner.

## 2024-07-17 NOTE — ED Triage Note (Signed)
 Left sided chest pain and discomfort and SOB for the last two hours. TTS dialysis, he went this am and they sent him to the ER> Patient is pale.

## 2024-07-17 NOTE — ED Provider Notes (Signed)
 [facility]  Emergency Department Provider Note      ED Clinical Impression       Diagnosis ICD-10-CM Associated Orders   1. Chest pain in adult  R07.9       2. Abdominal pain, unspecified abdominal location  R10.9       3. Shortness of breath  R06.02                Impression, Medical Decision Making, Progress Notes and Critical Care      Impression, Differential Diagnosis and Plan of Care    Richard [name] is a 59 y.o. male with a PMH     Medical Decision Making  59 year old male with history of heart failure with preserved ejection fraction, type 2 diabetes, end-stage renal disease on hemodialysis (TTS; last session Tuesday), and prior liver transplant (2013, on immunosuppressive therapy) presented with acute onset left-sided chest pain radiating to the back and peri-umbilical/left lower quadrant abdominal pain, which began a few hours prior to arrival and woke him from sleep. He was sent from dialysis clinic to ED and missed his session this AM. On exam, he was uncomfortable, hypertensive, with clear lungs and focal abdominal tenderness, and EKG showed a chronic right bundle branch block. Laboratory studies revealed elevated BNP and mildly elevated troponin.    Differential diagnosis includes, but is not limited to:  - Acute Coronary Syndrome: Mildly elevated troponin and chest pain raise concern for cardiac ischemia, though the pain is not exertional and EKG shows no new changes  - Volume Overload due to Missed Dialysis: Elevated BNP and missed dialysis suggest volume overload as a contributor to symptoms, particularly in the context of heart failure and end-stage renal disease.  - Gastrointestinal or Transplant-Related Abdominal Pain: Abdominal pain appears relatively non-focal without rebound tenderness or guarding. Patient denies emesis, diarrhea, or fever, making infectious gastroenteritis less likely. CTA 1/14 reassuring against mesenteric ischemia or acute intra-abdominal pathology. Did demonstrate umbilical hernia but fat-containing only, as well as non obstructing nephrolithiasis bilaterally without hydronephrosis. No epigastric pain c/f pancreatitis. CT abdomen pelvis 1/13 demonstrating partially thrombosed distal splenic artery aneurysm, which may be contributory to current symptoms given left-sided abdominal pain    Chest pain and abdominal pain in a patient with heart failure, end-stage renal disease, and prior liver transplant  - Administered morphine , zofran  for comfort and nausea.  - Ordered EKG, chest x-ray  - Ordered laboratory studies including serial troponin, CBC, CMP, Mg, phos, BNP, coag studies, lipase, ammonia, and RPP    End-stage renal disease with missed dialysis  - Arrange for dialysis session as soon as possible.  - Monitor electrolytes and fluid status.      ED Course as of 07/18/24 1129   Thu Jul 17, 2024   0730 ECG 12 Lead  EKG with regular rate and rhythm without easily discernable p-waves. RBBB demonstrated on prior EKG along with ST depressions in III, aVF, I, and avL also demonstrated on prior   0754 Potassium(!): 5.1  Last dialysis session Tues 15 Jul 752 PRO-BNP(!): 4,924.0  6328 on prior admission 07/08/24   0754 hsTroponin I(!!): 59   0755 Ammonia(!): 51  No asterixis on exam. Patient endorses past hx of encephalopathy but appears to be mentating well currently   0757 Influenza/ RSV/COVID PCR:    SARS-CoV-2 PCR Negative   Influenza A Negative   Influenza B Negative   RSV Negative   0824 XR Chest 2 views    Impression:  No acute cardiopulmonary abnormalities.     0848 hsTroponin I(!!): 54   [id number] Hello, requesting admission for [id number] [name], Ramus 59 y/o M PMH T2DM, NASH s/p liver transplant, ESRD on HD TTS, CAD, splenic artery aneurysm p/f L chest pain, periumbilical and left sided abdominal pain. 2x troponin in 50s. Thanks Whitney Hillegass [phone]   1000 Nephrology at bedside; will plan for urgent dialysis given missed session this AM   1054 Inpatient team assumed care of patient. Patient taken off floor to receive dialysis.       Portions of this record have been created using Scientist, clinical (histocompatibility and immunogenetics). Dictation errors have been sought, but may not have been identified and corrected.    See chart and nursing documentation for additional ED course details.    ____________________________________________         History        Reason for Visit  Chest Pain      HPI   Richard [name] is a 59 y.o. male     History of Present Illness  Richard [name] is a 59 year old male with a history of liver transplant and dialysis who presents with acute chest and abdominal pain.    He awoke this morning with acute left-sided chest pressure radiating to his back. The pain started at rest, is not worse with exertion, and began around the same time as new peri-umbilical and left-sided abdominal pain, which he describes as very painful.    He has had no vomiting, diarrhea, or change in bowel movements. At dialysis this morning he developed shortness of breath, had elevated blood pressure, and missed his full dialysis session. He is currently on oxygen for dyspnea.    He has had elevated ammonia with encephalopathy twice in the past year and is worried this is recurring. He reports taking his liver transplant immunosuppressants (Prograf  and Myfortic ) consistently since his 2013 transplant.    He also feels numb and nauseated, with dry mouth. He denies fever, chills, or vomiting.      Past Medical History[1]    Past Surgical History[2]      Current Facility-Administered Medications:     acetaminophen  (TYLENOL ) tablet 1,000 mg, 1,000 mg, Oral, TID, Kumfer, Jenkins Jansky, MD    apixaban  (ELIQUIS ) tablet 5 mg, 5 mg, Oral, BID, Kumfer, Jenkins Jansky, MD, 5 mg at 07/18/24 9173    aspirin  chewable tablet 81 mg, 81 mg, Oral, Daily, Kumfer, Ann Marie, MD, 81 mg at 07/18/24 9173    calcitriol  (ROCALTROL ) capsule 0.5 mcg, 0.5 mcg, Oral, Tue-Thur-Sat, Kumfer, Ann Marie, MD, 0.5 mcg at 07/17/24 1858 calcium  carbonate (TUMS) chewable tablet 400 mg elem calcium , 400 mg elem calcium , Oral, Daily PRN, Kumfer, Jenkins Jansky, MD    carvedilol  (COREG ) tablet 3.125 mg, 3.125 mg, Oral, BID, Kumfer, Jenkins Jansky, MD, 3.125 mg at 07/18/24 9171    cholecalciferol  (vitamin D3 25 mcg (1,000 units)) tablet 50 mcg, 50 mcg, Oral, Daily, Kumfer, Jenkins Jansky, MD, 50 mcg at 07/18/24 9173    dextrose  50 % in water (D50W) 50 % solution 12.5 g, 12.5 g, Intravenous, Q15 Min PRN, Kumfer, Jenkins Jansky, MD    gabapentin  (NEURONTIN ) capsule 300 mg, 300 mg, Oral, Nightly, Kumfer, Jenkins Jansky, MD, 300 mg at 07/17/24 2142    gentamicin -sodium citrate  lock solution in NS, 2.1 mL, Intra-cannular, Each time in dialysis PRN, Villegas Kastner, Hadassah Pap, MD, 2.1 mL at 07/17/24 1519    gentamicin -sodium citrate  lock solution in NS, 2.1 mL, Intra-cannular, Each  time in dialysis PRN, Villegas Kastner, Hadassah Pap, MD, 2.1 mL at 07/17/24 1519    glucagon  injection 1 mg, 1 mg, Intramuscular, Once PRN, Kumfer, Jenkins Jansky, MD    glucose chewable tablet 16 g, 16 g, Oral, Q10 Min PRN, Kumfer, Jenkins Jansky, MD    guaiFENesin  (ROBITUSSIN) oral syrup, 200 mg, Oral, Q4H PRN, Kumfer, Jenkins Jansky, MD    heparin  (porcine) 1000 unit/mL injection 2,000 Units, 2,000 Units, Dialysis Circuit, Each time in dialysis, Demaris Pepper, Hadassah Pap, MD, 2,000 Units at 07/17/24 1126    HYDROmorphone  (DILAUDID ) injection Syrg 0.5 mg, 0.5 mg, Intravenous, Q2H PRN, 0.5 mg at 07/17/24 1218 **OR** HYDROmorphone  (PF) (DILAUDID ) injection 1 mg, 1 mg, Intravenous, Q2H PRN, Kumfer, Ann Marie, MD, 1 mg at 07/18/24 1011    insulin  glargine (LANTUS ) injection BASAL 20 Units, 20 Units, Subcutaneous, Nightly, Kumfer, Jenkins Jansky, MD, 20 Units at 07/17/24 2212    insulin  lispro (HumaLOG ) injection 10 Units, 10 Units, Subcutaneous, TID AC, Kumfer, Ann Marie, MD, 10 Units at 07/18/24 9287    insulin  lispro (HumaLOG ) injection CORRECTIONAL 0-20 Units, 0-20 Units, Subcutaneous, ACHS, Kumfer, Jenkins Jansky, MD, 1 Units at 07/18/24 9287    melatonin tablet 3 mg, 3 mg, Oral, Nightly PRN, Kumfer, Ann Marie, MD, 3 mg at 07/18/24 9666    mycophenolate  (CELLCEPT ) capsule 250 mg, 250 mg, Oral, BID, Kumfer, Ann Marie, MD, 250 mg at 07/18/24 9171    oxyCODONE  (ROXICODONE ) immediate release tablet 5 mg, 5 mg, Oral, Q6H PRN, Kumfer, Jenkins Jansky, MD    pantoprazole  (Protonix ) EC tablet 40 mg, 40 mg, Oral, Daily before breakfast, Kumfer, Jenkins Jansky, MD, 40 mg at 07/18/24 9286    polyethylene glycol (MIRALAX ) packet 17 g, 17 g, Oral, Daily, Kumfer, Jenkins Jansky, MD    promethazine (PHENERGAN) tablet 12.5 mg, 12.5 mg, Oral, Q6H PRN **OR** promethazine (PHENERGAN) tablet 25 mg, 25 mg, Oral, Q6H PRN, Kumfer, Jenkins Jansky, MD    rifAXIMin  (XIFAXAN ) tablet 550 mg, 550 mg, Oral, BID, Kumfer, Jenkins Jansky, MD, 550 mg at 07/18/24 0825    sevelamer  (RENVELA ) tablet 2,400 mg, 2,400 mg, Oral, 3xd Meals, Clem Jenkins Jansky, MD, 2,400 mg at 07/18/24 9286    tacrolimus  (PROGRAF ) capsule 0.5 mg, 0.5 mg, Oral, Nightly, Adem, Mukhtar, MD    tacrolimus  (PROGRAF ) capsule 0.5 mg, 0.5 mg, Oral, Once, Adem, Mukhtar, MD    [START ON 07/19/2024] tacrolimus  (PROGRAF ) capsule 1 mg, 1 mg, Oral, Daily, Adem, Mukhtar, MD    thiamine  (B-1) injection 200 mg, 200 mg, Intravenous, Daily, Kumfer, Jenkins Jansky, MD, 200 mg at 07/17/24 2142    ursodiol  (ACTIGALL ) capsule 600 mg, 600 mg, Oral, BID, Kumfer, Ann Marie, MD, 600 mg at 07/18/24 0827    Allergies  Patient has no known allergies.    Family History[3]    Short Social History[4]       Physical Exam     ED Triage Vitals   Enc Vitals Group      BP 07/17/24 0714 (!) 169/106      Pulse 07/17/24 0621 84      SpO2 Pulse 07/17/24 0623 85      Resp 07/17/24 0621 (!) 24      Temp 07/17/24 0621 36.4 ??C (97.5 ??F)      Temp Source 07/17/24 0621 Temporal      SpO2 07/17/24 0621 100 %      Weight 07/17/24 0621 (!) 115.6 kg (254 lb 13.6 oz)      Height --  Head Circumference --       Peak Flow --       Pain Score --       Pain Loc -- Pain Education --       Exclude from Growth Chart --        Physical Exam  GENERAL: Alert and oriented, cooperative, uncomfortable-appearing  HEENT: Normocephalic, normal oropharynx, moist mucous membranes  CHEST: Clear to auscultation bilaterally, no wheezes, rhonchi, or crackles  CARDIOVASCULAR: Normal heart rate and rhythm, S1 and S2 normal without murmurs  ABDOMEN: Tender to palpation peri-umbilical and left lower quadrant, soft, non-distended  EXTREMITIES: No cyanosis or edema  NEUROLOGICAL: Cranial nerves grossly intact, moves all extremities without gross motor or sensory deficit         Radiology     PVL Hemodialysis Graft/Fistula Duplex   Final Result      CTA Aortic Dissection   Final Result      No evidence of acute aortic syndrome.      Ectasia of the ascending thoracic aorta to 3.8 cm. Consider interval imaging follow-up to ensure stability in 6 months.      XR Chest 2 views   Final Result      No acute cardiopulmonary abnormalities.                           [1]   Past Medical History:  Diagnosis Date    CHF (congestive heart failure) (CMS-HCC)     COVID-19 07/18/2019    Diabetes mellitus (CMS-HCC)     Family history of malignant neoplasm of prostate 06/23/2015    Heart disease     Heart murmur     Hepatic cirrhosis    (CMS-HCC) 06/23/2015    Overview:  Secondary to NASH; followed by Dr. Lindaann, GI.  Last Assessment & Plan:  Relevant Hx: Course: Daily Update: Today's Plan:     Hypertension     Hypogonadism in male 02/12/2014    Kidney stone     Liver cirrhosis secondary to NASH (nonalcoholic steatohepatitis) (CMS-HCC)     s/p liver transplant 2013    Obesity    [2]   Past Surgical History:  Procedure Laterality Date    CHOLECYSTECTOMY      liver transpant      LIVER TRANSPLANTATION  06/27/2011    PR ANASTOMOSIS,AV,ANY SITE Right 02/20/2024    Procedure: ARTERIOVENOUS ANASTOMOSIS, OPEN; DIRECT, ANY SITE, UPPER EXTREMITY;  Surgeon: Marchelle Kitchens, MD;  Location: [facility];  Service: General Surgery    PR AV ANAST,UP ARM BASILIC VEIN TRANSPOSIT Right 03/31/2024    Procedure: ARTERIOVENOUS ANASTOMOSIS, OPEN; BY UPPER ARM BASILIC VEIN TRANSPOSITION;  Surgeon: Marchelle Kitchens, MD;  Location: [facility] OR [facility];  Service: General Surgery    PR AV FIST REVISE GRFT,W THROMBECTOMY Right 01/15/2023    Procedure: REVISION, ARTERIOVENOUS FISTULA W/ THROMBECTOMY, AUTOGENOUS OR NONAUTOGENOUS DIALYSIS GRAFT (SEP. PROC), LOWER EXTREMITY;  Surgeon: Marchelle Kitchens, MD;  Location: [facility] OR [facility];  Service: General Surgery    PR COLONOSCOPY FLX DX W/COLLJ SPEC WHEN PFRMD N/A 01/24/2024    Procedure: COLONOSCOPY, FLEXIBLE, PROXIMAL TO SPLENIC FLEXURE; DIAGNOSTIC, W/WO COLLECTION SPECIMEN BY BRUSH OR WASH;  Surgeon: Erick Prentice HERO, MD;  Location: [facility];  Service: Gastroenterology    PR COLONOSCOPY W/BIOPSY SINGLE/MULTIPLE N/A 08/13/2018    Procedure: COLONOSCOPY, FLEXIBLE, PROXIMAL TO SPLENIC FLEXURE; WITH BIOPSY, SINGLE OR MULTIPLE;  Surgeon: Thedora Alm Plain, MD;  Location: [facility];  Service: Gastroenterology    PR COLSC FLX W/RMVL OF TUMOR POLYP LESION SNARE TQ N/A 08/13/2018    Procedure: COLONOSCOPY FLEX; W/REMOV TUMOR/LES BY SNARE;  Surgeon: Thedora Alm Plain, MD;  [facility]: [facility];  Service: Gastroenterology    PR CREAT AV FISTULA,NON-AUTOGENOUS GRAFT Right 11/29/2022    Procedure: CREATE AV FISTULA (SEPARATE PROC); NONAUTOGENOUS GRAFT (EG, BIOLOGICAL COLLAGEN, THERMOPLASTIC GRAFT), UPPER EXTREMITY;  Surgeon: Marchelle Kitchens, MD;  Location: [facility] OR [facility];  Service: General Surgery    PR UPPER GI ENDOSCOPY,BIOPSY N/A 07/13/2015    Procedure: UGI ENDOSCOPY; WITH BIOPSY, SINGLE OR MULTIPLE;  Surgeon: Elspeth Jerilynn Reek, MD;  [facility]: [facility];  Service: Gastroenterology    PR UPPER GI ENDOSCOPY,BIOPSY N/A 02/13/2023    Procedure: UGI ENDOSCOPY; WITH BIOPSY, SINGLE OR MULTIPLE;  Surgeon: Minnie Krystal Claude, MD; Location: [facility];  Service: Gastroenterology    PR UPPER GI ENDOSCOPY,DIAGNOSIS N/A 01/24/2024    Procedure: UGI ENDO, INCLUDE ESOPHAGUS, STOMACH, & DUODENUM &/OR JEJUNUM; DX W/WO COLLECTION SPECIMN, BY BRUSH OR WASH;  Surgeon: Erick Prentice HERO, MD;  [facility]: [facility];  Service: Gastroenterology   [3]   Family History  Problem Relation Age of Onset    Diabetes Father     Diabetes Paternal Grandfather     Liver disease Maternal Uncle     Cancer Maternal Grandmother     Melanoma Neg Hx     Basal cell carcinoma Neg Hx     Squamous cell carcinoma Neg Hx     Kidney disease Neg Hx    [4]   Social History  Tobacco Use    Smoking status: Never     Passive exposure: Past    Smokeless tobacco: Never   Vaping Use    Vaping status: Never Used   Substance Use Topics    Alcohol use: Never    Drug use: Never        Lafonda Patron L, MD  Resident  07/18/24 1131

## 2024-07-17 NOTE — Hospital Course (Addendum)
 Richard [name] is a 59 y.o. 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      Chest pain, mild troponin elevation, episodes of lightheadedness: patient had an episode of chest pain.  He is on anticoagulation at baseline and stable on room air making pulmonary embolism unlikely.  Description of pain does not sound consistent with aortic dissection.  It is possible that combination of hypertension and volume overload is precipitating episode.  He does not make urine.  Did consult cardiology considering risk factors for significant CAD and somewhat equivocal stress test in May 2024.  Cardiology discussed with the patient and decided for medical management for consideration of cath if symptoms are ongoing.  In setting of recent echocardiogram last hospitalization, will not order repeat at this time. Post dialysis pt had an episode of severe chest pain.  CT aortic dissection protocol negative.  He had no acute changes on ECG.  Mild elevation in trop in setting of ESRD which normalized.  We discussed with cardiology consult and appreciate recommendations and to follow-up outpatient and consider cath if symptoms ongoing.  He was monitored on telemetry and there was no evidence of arrhthymias or pauses.  He was started on a trial of carvedilol  3.125 per cardiology's recommendation but was discontinued due to hypotension post dialysis.  He will continue aspirin  and apixaban  and he was restarted on atorvastatin  40 mg nightly (unclear why stopped)    Nuclear stress May 2024   Low risk study  - No significant ischemia noted  - There is a moderate sized, mild in severity, fixed perfusion defect involving the mid anterior, apical anterior and apical segments. This is consistent with possible artifact (motion and misregistration of attenuation CT, attenuation) but, cannot rule out subtle scar.  - Left ventricular systolic function is normal. Post stress the ejection fraction is > 60%.  - Mild coronary calcifications are noted     Abdominal pain exacerbated by exertion, fluid shifts umbilical hernia-received extensive evaluation last hospitalization for this.  Evaluation has included several CTs of the abdomen and pelvis, CT mesenteric ischemia protocol, EGD in late July, surgery evaluation.  Surgery saw him last admission and did not recommend surgery for hernia considering small size, lack of bowel involvement and comorbid conditions.  So far, there is not a clear etiology of the symptoms.  Does report symptoms are exacerbated by stress.  The fact that the pain is not worsened by food intake makes mesenteric ischemia less likely. SIBO felt less likely since patient is on chronic rifaximin .  Overall,since symptoms are stable and improving this admission recently received an extensive evaluation last admissionand  will not pursue further abdominal imaging at this time as long as remains clinically stable without changes in abd exam. His Lactate came back elevated at 3.6 on admission. Per review has had some chronic lactate elevations in the past.  Lactate normalized without intervention. In the setting of severe chest pain with concurrent abdominal pain and elevated lactate  CTA aortic dissection protocol obtained and was negative this admission.  His pain was controlled with IV hydromorphone  per his request as it is the only thing that work for him.  We encouraged limiting opioid pain medications in  the setting of chronic pain     Splenic artery aneurysm, possible stent occlusion  - underwent recent stenting of 2.6 cm hilar splenic artery aneurysm on 12/29.  Last CT scan on 113 showed lack of opacification of the stent concerning for possible stent occlusion.  Discussed with radiology and per radiology no need for intervention since there is still adequate perfusion of the spleen and the psuedoaneurysm appears completely thrombosed and excluded   - continue aspirin /apixiban      Lactic acidosis-after admission lactate came back elevated at 3.6 after HD.  Per review had some mildly elevated lactate last hospitalization (2.1-3.3).  Wonder if this is a response of the body with poor tolerance to HD where the shunting of fluids during HD causes relative hypoperfusion to other tissues leading to this phenomenon.  - blood cultures were negative  - lactate normalized  - CTA aortic dissection negative     [name] Cirrhosis s/p OLT (02/2012)  Established with [facility] (Dr. Lennette). No history of rejection or biliary issues. however has had multiple prior admissions for hepatic encephalopathy 01/2024 MR elastography with stage 3 fibrosis.  - Continue tacrolimus  0.5mg  at bedtime and 1 mg qam (goal 2-4)   - Continue cellcept  250mg  BID  - Continue ursodiol  600mg  BID   - Continue rifaximin  BID     ESRD on iHD TTS - Hyperkalemia   Etiology of ESRD 2/2 CNI toxicity and HTN.  Is currently dialyzing through left tunnel internal jugular  iHD line.  Has AV fistula but increased swelling  around site after infiltration events and now dialyzing through tunneled line. PVL duplex earlier in January showed patent fistula but cannulation remain challenging due to edema last hospitalization. Had repeat duplex of the arm given hx of increased swelling around fistula site and the US  showed Patent AVF with velocity elevations at the area of narrowing.  There was no fluid collection noted at the patient area of concern.  Nephrology consulted for HD management and pt was dialyzed on Friday and Sat  - continue sevelemer 800mg  TID   - continue vitamin D supplement      T2DM  Patient reports sliding scale for both his long acting and meal time insulin .  Not adequately controlled at baseline with last hemoglobin A1c of 10.6 (04/2024) Reports more recently he takes about 30 units of long-acting insulin , between 10 to 30 units with meals based on blood sugars, mounjaro  15mg  weekly.  He was treated with Basal: 20U lantus  and  10U lispro with meals in addition to correctional sliding scale     - Incidentally noted on the attenuation CT scan are several small calcified nodules likely representing sequelae of previous granulomatous disease

## 2024-07-17 NOTE — Consults (Signed)
 Nephrology ESKD Consult Note    Requesting provider: Rosette Jann Dais, MD  Service requesting consult: Emergency Medicine  Reason for consult: Evaluation for dialysis needs    Outpatient dialysis unit:   [facility]  [address]  [place] [address]     Assessment/Recommendations: Richard [name] is a 59 y.o. male with a past medical history notable for ESKD on in-center hemodialysis being evaluated at [facility] for  new onset shortness of breath and chest discomfort.    # ESKD:   - Indication for acute dialysis?: Yes, shortness of Breath, Yes, volume overload, and Yes, hyperkalemia  - We will perform HD starting today and will otherwise attempt to continue pt's outpatient schedule if possible. Due for HD today  - Access: Right IJ tunneled catheter ; Vascular access functioning well- no indication for intervention.  - Hepatitis status: Hepatitis B surface antigen negative on 06/24/2024.    # Volume / Hypertension:  - Volume: Will plan to remove 2-3L as tolerated  - losing weight. DW seems to be closer to 115kg.   - Will adjust daily as needed    # Acid-Base / Electrolytes:  - Will evaluate acid-base status and electrolyte balance daily and adjust dialysate as needed.  Lab Results   Component Value Date    K 5.1 (H) 07/17/2024    CO2 22.3 07/17/2024       # Anemia of Chronic Kidney Disease:  - ESA: Patient not on ESA , Last dose NA  - IV Iron: not indicated   Lab Results   Component Value Date    HGB 12.6 (L) 07/17/2024       # Secondary Hyperparathyroidism/Hyperphosphatemia:  - Phosphate Binders:  sevelamer  1600 mg TID with meals   - Activated Vit D: 0.5 mcg TTS  - Calcimimetics: no  Lab Results   Component Value Date    PHOS 3.7 07/17/2024    CALCIUM  9.0 07/17/2024       Richard Marit Demaris Virgia, MD  07/17/2024 11:37 AM   Medical decision-making for 07/17/2024  Findings / Data     Patient has: []  acute illness w/systemic sxs  [mod]  []  two or more stable chronic illnesses [mod]  []  one chronic illness with acute exacerbation [mod]  []  acute complicated illness  [mod]  []  Undiagnosed new problem with uncertain prognosis  [mod] [x]  illness posing risk to life or bodily function (ex. AKI)  [high]  []  chronic illness with severe exacerbation/progression  [high]  []  chronic illness with severe side effects of treatment  [high] ESKD on RRT Probs At least 2:  Probs, Data, Risk   Mgm't requires: []  Prescription drug(s)  [mod]  []  Kidney biopsy  [mod]  []  Central line placement  [mod] []  High risk medication use and/or intensive toxicity monitoring [high]  [x]  Renal replacement therapy [high]  []  High risk kidney biopsy  [high]  []  Escalation of care  [high]  []  High risk central line placement  [high] RRT: High risk of complications from RRT requiring intensive monitoring Risk        _____________________________________________________________________________________    History of Present Illness: Richard [name] is a 59 y.o. male with ESKD on dialysis, as well as, liver cirrhosis secondary to NASH status post liver transplant, coronary artery disease, hypertension, heart failure with preserved ejection fraction, and type 2 diabetes mellitus, being evaluated at [facility] for shortness of breath and chest pain  who is seen in consultation at the request of  Rosette Jann Dais, MD and Emergency Medicine. Nephrology has been consulted for evaluation of dialysis needs in the setting of end-stage kidney disease.    Patient was evaluated at the bedside in the emergency department, resting comfortably. He presented to routine hemodialysis this morning but experienced sudden onset of shortness of breath accompanied by chest discomfort that began overnight around 3:00 AM and prevented him from resting. He was unable to characterize the pain; however, both the discomfort and shortness of breath have since improved. The patient reported adherence to dialysis sessions, with the last session on Tuesday and scheduled for hemodialysis today. He denies any additional complaints.    Labs reviewed, abnormal urea and creatinine as expected on end-stage renal disease.  Mild hyperkalemia.  proBNP elevated but not different from before.  Troponin is mildly elevated but trending down.    Physical Exam:  Vitals:    07/17/24 1117   BP: (!) 167/116   Pulse: 100   Resp: 19   Temp:    SpO2:      No intake/output data recorded.  No intake or output data in the 24 hours ending 07/17/24 1137    General: well-appearing, no acute distress  Heart: RRR, no m/r/g  Lungs: CTAB, normal wob  Abd: soft, non-tender, non-distended  Ext: minimal edema  Access: Right IJ tunneled catheter   and Right AVF but not functioning properly

## 2024-07-17 NOTE — Plan of Care (Signed)
 Shift Summary  Hemodialysis was completed with appropriate anticoagulation and catheter care.   Pain management included both pharmacological and non-pharmacological interventions, with improvement in pain scores by the end of the shift.   No infection symptoms or hospital-acquired injuries were documented.   No falls occurred, and fall risk remained low.   Overall, therapy delivery and patient safety goals were maintained throughout the shift.     Safe, Effective Therapy Delivery: Hemodialysis was completed with consistent use of the Hemosafe clip and administration of heparin  and gentamicin -sodium citrate  for catheter care; blood volume changes fluctuated but remained within a manageable range throughout the session.     Absence of Infection Signs and Symptoms: No new symptoms of infection were documented, and gentamicin -sodium citrate  was administered for catheter care.     Absence of Hospital-Acquired Illness or Injury: No hospital-acquired injuries or illnesses were documented during the shift.     Optimal Comfort and Wellbeing: Abdominal pain fluctuated throughout the shift, with pain scores ranging from 8 to 0; HYDROmorphone  was administered twice, and non-pharmacological interventions were used, resulting in eventual pain reduction by the end of the shift.     Absence of Fall and Fall-Related Injury: No falls or fall-related injuries occurred, and the fall risk score remained at zero.

## 2024-07-17 NOTE — Procedures (Signed)
 [facility] Nephrology Hemodialysis Procedure Note     07/17/2024    KEYAAN LEDERMAN was seen and examined on hemodialysis    CHIEF COMPLAINT: End Stage Kidney Disease    INTERVAL HISTORY: Seen at the dialysis unit.  Sitting comfortably in recliner.  Denies any acute complaints.  Tolerating treatment well.  Blood pressure slightly elevated, 167/80.  UF 2.5 L    DIALYSIS TREATMENT DATA:  Estimated Dry Weight (kg): 115 kg (253 lb 8.5 oz) Patient Goal Weight (kg): 2.5 kg (5 lb 8.2 oz)   Pre-Treatment Weight (kg): 115.2 kg (253 lb 15.5 oz)    Dialysis Bath  Bath: 2 K+ / 2.5 Ca+  Dialysate Na (mEq/L): 137 mEq/L  Dialysate HCO3 (mEq/L): 35 mEq/L Dialyzer: F-180 (98 mLs)   Blood Flow Rate (mL/min): 400 mL/min Dialysis Flow (mL/min): 800 mL/min   Machine Temperature (C): 36.5 ??C (97.7 ??F)      PHYSICAL EXAM:  Vitals:  Temp:  [35.9 ??C (96.6 ??F)-36.4 ??C (97.5 ??F)] 35.9 ??C (96.6 ??F)  Pulse:  [84-108] 108  SpO2 Pulse:  [83-105] 104  BP: (147-169)/(92-116) 157/106    General: in no acute distress, currently dialyzing in a Hemodialysis Recliner  Pulmonary: normal respiratory effort  Cardiovascular: regular rate and rhythm  Extremities: trace  edema  Access: Right IJ tunneled catheter     LAB DATA:  Lab Results   Component Value Date    NA 136 07/17/2024    K 5.1 (H) 07/17/2024    CL 96 (L) 07/17/2024    CO2 22.3 07/17/2024    BUN 20 07/17/2024    CREATININE 9.09 (H) 07/17/2024    GLUCOSE 196 03/03/2012    CALCIUM  9.0 07/17/2024    MG 2.1 07/17/2024    PHOS 3.7 07/17/2024    ALBUMIN 3.3 (L) 07/17/2024      Lab Results   Component Value Date    HCT 38.0 (L) 07/17/2024    WBC 4.9 07/17/2024        ASSESSMENT/PLAN:  End Stage Renal Disease on Intermittent Hemodialysis:  UF goal: 2-3L as tolerated. EDW unchanged since admission.  Estimated dry weight at the facility is 116 kg however patient seems like has been losing weight.  On Ozempic .  Currently down to 115.6 kg.  Per outpatient nephrologist, he has been challenged recently.  Is unclear if symptoms are related to fluid overload.  Adjust medications for a GFR <10  Avoid nephrotoxic agents  Last HD Treatment:Started (07/17/24)     Bone Mineral Metabolism:  Lab Results   Component Value Date    CALCIUM  9.0 07/17/2024    CALCIUM  8.7 07/08/2024    Lab Results   Component Value Date    ALBUMIN 3.3 (L) 07/17/2024    ALBUMIN 3.2 (L) 07/08/2024      Lab Results   Component Value Date    PHOS 3.7 07/17/2024    PHOS 3.9 07/03/2024    Lab Results   Component Value Date    PTH 943.9 (H) 05/26/2024    PTH 267.5 (H) 10/14/2021      Continue phosphorus binder and dietary counseling.  continue Renvela  1600mg  with meals for hyperphosphatemia.    Anemia:   Lab Results   Component Value Date    HGB 12.6 (L) 07/17/2024    HGB 11.9 (L) 07/08/2024    HGB 12.2 (L) 07/03/2024    Iron Saturation (%)   Date Value Ref Range Status   11/09/2022 36 % Final   05/15/2012 39 20 -  50 % Final      Lab Results   Component Value Date    FERRITIN 169.8 11/09/2022       Anemia labs appropriate, no changes.    Vascular Access:  Vascular Access functioning well - no need for intervention  Blood Flow Rate (mL/min): 400 mL/min    IV Antibiotics to be administered at discharge:  No    This procedure was fully reviewed with the patient and/or their decision-maker. The risks, benefits, and alternatives were discussed prior to the procedure. All questions were answered and written informed consent was obtained.    Hadassah Marit Demaris Virgia, MD  [facility] Division of Nephrology & Hypertension

## 2024-07-18 LAB — CBC
HEMATOCRIT: 38.9 % — ABNORMAL LOW (ref 39.0–48.0)
HEMOGLOBIN: 13.1 g/dL (ref 12.9–16.5)
MEAN CORPUSCULAR HEMOGLOBIN CONC: 33.6 g/dL (ref 32.0–36.0)
MEAN CORPUSCULAR HEMOGLOBIN: 28.4 pg (ref 25.9–32.4)
MEAN CORPUSCULAR VOLUME: 84.6 fL (ref 77.6–95.7)
MEAN PLATELET VOLUME: 8.6 fL (ref 6.8–10.7)
PLATELET COUNT: 197 10*9/L (ref 150–450)
RED BLOOD CELL COUNT: 4.6 10*12/L (ref 4.26–5.60)
RED CELL DISTRIBUTION WIDTH: 15.3 % — ABNORMAL HIGH (ref 12.2–15.2)
WBC ADJUSTED: 4.9 10*9/L (ref 3.6–11.2)

## 2024-07-18 LAB — COMPREHENSIVE METABOLIC PANEL
ALBUMIN: 3.3 g/dL — ABNORMAL LOW (ref 3.4–5.0)
ALKALINE PHOSPHATASE: 193 U/L — ABNORMAL HIGH (ref 46–116)
ALT (SGPT): 7 U/L — ABNORMAL LOW (ref 10–49)
ANION GAP: 20 mmol/L — ABNORMAL HIGH (ref 5–14)
AST (SGOT): 20 U/L (ref <=34)
BILIRUBIN TOTAL: 0.6 mg/dL (ref 0.3–1.2)
BLOOD UREA NITROGEN: 19 mg/dL (ref 9–23)
BUN / CREAT RATIO: 3
CALCIUM: 9.1 mg/dL (ref 8.7–10.4)
CHLORIDE: 94 mmol/L — ABNORMAL LOW (ref 98–107)
CO2: 22.3 mmol/L (ref 20.0–31.0)
CREATININE: 6.35 mg/dL — ABNORMAL HIGH (ref 0.73–1.18)
EGFR CKD-EPI (2021) MALE: 10 mL/min/{1.73_m2} — ABNORMAL LOW (ref >=60)
GLUCOSE RANDOM: 188 mg/dL — ABNORMAL HIGH (ref 70–179)
POTASSIUM: 4.9 mmol/L (ref 3.5–5.1)
PROTEIN TOTAL: 7 g/dL (ref 5.7–8.2)
SODIUM: 136 mmol/L (ref 135–145)

## 2024-07-18 LAB — MAGNESIUM: MAGNESIUM: 2.2 mg/dL (ref 1.6–2.6)

## 2024-07-18 LAB — TACROLIMUS LEVEL, TROUGH
TACROLIMUS, TROUGH: 5.4 ng/mL (ref 5.0–15.0)
TACROLIMUS, TROUGH: 5.5 ng/mL (ref 5.0–15.0)

## 2024-07-18 LAB — PHOSPHORUS: PHOSPHORUS: 3.6 mg/dL (ref 2.4–5.1)

## 2024-07-18 LAB — HIGH SENSITIVITY TROPONIN I - SINGLE: HIGH SENSITIVITY TROPONIN I: 53 ng/L (ref <=53)

## 2024-07-18 MED ADMIN — insulin lispro (HumaLOG) injection CORRECTIONAL 0-20 Units: 0-20 [IU] | SUBCUTANEOUS | @ 18:00:00

## 2024-07-18 MED ADMIN — insulin lispro (HumaLOG) injection CORRECTIONAL 0-20 Units: 0-20 [IU] | SUBCUTANEOUS | @ 12:00:00

## 2024-07-18 MED ADMIN — insulin lispro (HumaLOG) injection CORRECTIONAL 0-20 Units: 0-20 [IU] | SUBCUTANEOUS | @ 03:00:00

## 2024-07-18 MED ADMIN — HYDROmorphone (PF) (DILAUDID) injection 1 mg: 1 mg | INTRAVENOUS | @ 15:00:00 | Stop: 2024-07-18

## 2024-07-18 MED ADMIN — HYDROmorphone (PF) (DILAUDID) injection 1 mg: 1 mg | INTRAVENOUS | @ 03:00:00 | Stop: 2024-07-24

## 2024-07-18 MED ADMIN — HYDROmorphone (PF) (DILAUDID) injection 1 mg: 1 mg | INTRAVENOUS | @ 11:00:00 | Stop: 2024-07-18

## 2024-07-18 MED ADMIN — HYDROmorphone (PF) (DILAUDID) injection 1 mg: 1 mg | INTRAVENOUS | @ 07:00:00 | Stop: 2024-07-18

## 2024-07-18 MED ADMIN — HYDROmorphone (PF) (DILAUDID) injection 1 mg: 1 mg | INTRAVENOUS | @ 18:00:00 | Stop: 2024-07-18

## 2024-07-18 MED ADMIN — mycophenolate (CELLCEPT) capsule 250 mg: 250 mg | ORAL | @ 13:00:00

## 2024-07-18 MED ADMIN — mycophenolate (CELLCEPT) capsule 250 mg: 250 mg | ORAL | @ 03:00:00

## 2024-07-18 MED ADMIN — gabapentin (NEURONTIN) capsule 300 mg: 300 mg | ORAL | @ 03:00:00

## 2024-07-18 MED ADMIN — insulin lispro (HumaLOG) injection 10 Units: 10 [IU] | SUBCUTANEOUS | @ 12:00:00

## 2024-07-18 MED ADMIN — insulin lispro (HumaLOG) injection 10 Units: 10 [IU] | SUBCUTANEOUS | @ 18:00:00

## 2024-07-18 MED ADMIN — apixaban (ELIQUIS) tablet 5 mg: 5 mg | ORAL | @ 13:00:00

## 2024-07-18 MED ADMIN — apixaban (ELIQUIS) tablet 5 mg: 5 mg | ORAL | @ 03:00:00

## 2024-07-18 MED ADMIN — sevelamer (RENVELA) tablet 2,400 mg: 2400 mg | ORAL | @ 12:00:00

## 2024-07-18 MED ADMIN — sevelamer (RENVELA) tablet 2,400 mg: 2400 mg | ORAL

## 2024-07-18 MED ADMIN — gentamicin-sodium citrate lock solution in NS: 2.1 mL

## 2024-07-18 MED ADMIN — pantoprazole (Protonix) EC tablet 40 mg: 40 mg | ORAL | @ 12:00:00

## 2024-07-18 MED ADMIN — tacrolimus (PROGRAF) capsule 0.5 mg: .5 mg | ORAL | @ 12:00:00 | Stop: 2024-07-18

## 2024-07-18 MED ADMIN — cholecalciferol (vitamin D3 25 mcg (1,000 units)) tablet 50 mcg: 50 ug | ORAL | @ 13:00:00

## 2024-07-18 MED ADMIN — rifAXIMin (XIFAXAN) tablet 550 mg: 550 mg | ORAL | @ 03:00:00 | Stop: 2024-10-24

## 2024-07-18 MED ADMIN — rifAXIMin (XIFAXAN) tablet 550 mg: 550 mg | ORAL | @ 13:00:00 | Stop: 2024-10-24

## 2024-07-18 MED ADMIN — melatonin tablet 3 mg: 3 mg | ORAL | @ 09:00:00

## 2024-07-18 MED ADMIN — carvedilol (COREG) tablet 3.125 mg: 3.125 mg | ORAL | @ 13:00:00

## 2024-07-18 MED ADMIN — carvedilol (COREG) tablet 3.125 mg: 3.125 mg | ORAL | @ 03:00:00

## 2024-07-18 MED ADMIN — tacrolimus (PROGRAF) capsule 0.5 mg: .5 mg | ORAL | @ 18:00:00 | Stop: 2024-07-18

## 2024-07-18 MED ADMIN — ursodiol (ACTIGALL) capsule 600 mg: 600 mg | ORAL | @ 03:00:00

## 2024-07-18 MED ADMIN — ursodiol (ACTIGALL) capsule 600 mg: 600 mg | ORAL | @ 13:00:00

## 2024-07-18 MED ADMIN — heparin (porcine) 1000 unit/mL injection 2,000 Units: 2000 [IU] | INTRAVENOUS_CENTRAL | @ 20:00:00

## 2024-07-18 MED ADMIN — insulin glargine (LANTUS) injection BASAL 20 Units: 20 [IU] | SUBCUTANEOUS | @ 03:00:00

## 2024-07-18 MED ADMIN — thiamine (B-1) injection 200 mg: 200 mg | INTRAVENOUS | @ 03:00:00 | Stop: 2024-07-21

## 2024-07-18 MED ADMIN — aspirin chewable tablet 81 mg: 81 mg | ORAL | @ 13:00:00

## 2024-07-18 NOTE — Plan of Care (Signed)
 Shift Summary  Correctional insulin  was administered for hyperglycemia, and blood glucose remained elevated throughout the shift.  Two PRN doses of HYDROmorphone  were given for persistent severe pain, with no reduction in pain score documented.  Cardiac monitoring and ECG were performed, with no significant change from previous ECG noted.  Fall prevention and safety interventions were consistently maintained, including scheduled toileting and bed safety measures.  The patient remained anxious and reported severe pain, but was able to express needs and received support for sleep; overall, therapy and safety interventions were delivered as ordered during the shift.    Safe, Effective Therapy Delivery: Correctional insulin  and carvedilol  were administered as ordered, and blood glucose was monitored with persistently elevated values; cardiac monitoring and ECG were performed, with no significant change compared to prior ECG. Pain management included two PRN doses of HYDROmorphone , and melatonin was given for sleep.    Absence of Hospital-Acquired Illness or Injury: Fall reduction program and bed safety interventions were maintained throughout the shift, with regular repositioning and scheduled toileting every two hours; no alarms were present and the environment remained safe.    Optimal Comfort and Wellbeing: Pain remained severe (9/10) throughout the shift despite two PRN doses of HYDROmorphone , and anxiety was noted; melatonin was administered to support sleep, and the patient was able to express needs and thoughts.

## 2024-07-18 NOTE — Consults (Signed)
 Tacrolimus  Therapeutic Monitoring Pharmacy Note    Richard [name] is a 58 y.o. male continuing tacrolimus .     Indication: Liver transplant     Date of Transplant: 03/02/2012      Prior Dosing Information: Current regimen 0.5 mg/0.5 mg  daily    Source(s) of information used to determine prior to admission dosing: Fill History    Goals:  Therapeutic Drug Levels  Tacrolimus  trough goal: 2-4 ng/mL    Additional Clinical Monitoring/Outcomes  Monitor renal function (SCr and urine output) and liver function (LFTs)  Monitor for signs/symptoms of adverse events (e.g., hyperglycemia, hyperkalemia, hypomagnesemia, hypertension, headache, tremor)    Previous Lab Values  Tacrolimus , Trough   Date/Time Value Ref Range Status   07/18/2024 06:47 AM 5.5 5.0 - 15.0 ng/mL Final   07/17/2024 06:52 AM 5.4 5.0 - 15.0 ng/mL Final   07/11/2024 06:22 AM 3.2 (L) 5.0 - 15.0 ng/mL Final   07/09/2024 07:25 AM 3.9 (L) 5.0 - 15.0 ng/mL Final   06/24/2024 07:10 AM 3.2 (L) 5.0 - 15.0 ng/mL Final   10/06/2014 09:00 AM 6.8 <=20.0 ng/mL Final   08/31/2014 08:34 AM 4.2  Final   08/03/2014 08:22 AM 4.7  Final   07/07/2014 08:37 AM 4.2  Final   06/09/2014 08:27 AM 3.5  Final     Tacrolimus , Timed   Date/Time Value Ref Range Status   07/03/2024 01:21 PM 4.2 ng/mL Final       Result:  Tacrolimus  level from today was 5.5, drawn appropriately     Pharmacokinetic Considerations and Significant Drug Interactions:  Concurrent CYP3A4 substrates/inhibitors: None identified    Assessment/Plan:  Recommendedation(s)  Ensure patient is on 0.5 mg/0.5 mg while hear. This level may be affected by patient being on a regimen of 1mg  / 0.5 mg as reported at home    Follow-up  Next level has been ordered on 07/19/24 at 0700.   A pharmacist will continue to monitor and recommend levels as appropriate    Please page service pharmacist with questions/clarifications.    Eva JONELLE Minerva, PharmD,PharmD

## 2024-07-18 NOTE — Plan of Care (Signed)
 4 hours HD today with uf goal of 2-3 liters as patient tolerates  Problem: Hemodialysis  Goal: Safe, Effective Therapy Delivery  Outcome: Shift Focus  Goal: Effective Tissue Perfusion  Outcome: Shift Focus  Goal: Absence of Infection Signs and Symptoms  Outcome: Shift Focus

## 2024-07-18 NOTE — Procedures (Signed)
 HEMODIALYSIS NURSE PROCEDURE NOTE       Treatment Number:  2 Room / Station:  2    Procedure Date:  07/18/2024 Device Name/Number: Charlie    Total Dialysis Treatment Time:  230 Min.    CONSENT:    Written consent was obtained prior to the procedure and is detailed in the medical record.  Prior to the start of the procedure, a time out was taken and the identity of the patient was confirmed via name, medical record number and date of birth.     WEIGHT:   Date/Time Pre-Treatment Weight (kg) Estimated Dry Weight (kg) Patient Goal Weight (kg) Total Goal Weight (kg)    07/18/24 1426 114 kg (251 lb 5.2 oz)  115 kg (253 lb 8.5 oz)  2 kg (4 lb 6.6 oz)  2.55 kg (5 lb 10 oz)        Date/Time Post-Treatment Weight (kg) Treatment Weight Change (kg)    07/18/24 1830 112.4 kg (247 lb 12.8 oz)  -1.6 kg      Active Dialysis Orders (168h ago, onward)       Start     Ordered    07/17/24 1012  Hemodialysis inpatient  Every Tue,Thu,Sat      Question Answer Comment   Patient HD Status: Chronic    New Start? No    K+ 2 meq/L    Ca++ 2.5 meq/L    Bicarb 35 meq/L    Na+ 137 meq/L    Na+ Modeling no    Dialyzer [id number]    Dialysate Temperature (C) 36.5    BFR-As tolerated to a maximum of: 400 mL/min    DFR 600 mL/min    Duration of treatment 4 Hr    Dry weight (kg) 115    Challenge dry weight (kg) yes    Fluid removal (L) 2-3L    Tubing Adult = 142 ml    Access Site Dialysis Catheter    Access Site Location Right    Needle size 15    Button hole (for needle size #15 ONLY) No        07/17/24 1011                  ASSESSMENT:  General appearance: Alert  Neurologic: A/Ox3  Lungs: diminished  Heart: S1S2  Abdomen: no complaints      ACCESS SITE:       Hemodialysis Catheter 05/20/24 Left Internal jugular 2.1 mL 2.1 mL (Active)   Site Assessment Clean;Dry;Intact 07/18/24 1830   Proximal Lumen Status / Patency Gentamicin  Citrate Locked 07/18/24 1830   Proximal Lumen Intervention Deaccessed 07/18/24 1830   Medial Lumen Status / Patency Gentamicin  Citrate Locked 07/18/24 1830   Medial Lumen Intervention Deaccessed 07/18/24 1830   Dressing Intervention No intervention needed 07/18/24 1830   Dressing Status      Clean;Dry;Intact/not removed 07/18/24 1830   Verification by X-ray Yes 07/17/24 1536   Site Condition No complications 07/18/24 1830   Dressing Type CHG gel;Occlusive;Transparent 07/18/24 1830   Dressing Change Due 07/24/24 07/18/24 1830   Line Necessity Reviewed? Y 07/18/24 1830   Line Necessity Indications Yes - Hemodialysis 07/18/24 1830   Line Necessity Reviewed With Nephrology 07/18/24 1830        Arteriovenous Fistula - Vein Graft  Access Arteriovenous fistula Right;Upper Arm (Active)   Site Assessment Dry;Intact 07/11/24 1015   AV Fistula Thrill Present 07/11/24 1015   Status Other (Comment) 07/11/24 1015   Dressing Intervention No intervention  needed 07/11/24 1015   Dressing Status      Clean;Dry;Intact/not removed 07/11/24 1015   Site Condition No complications 07/11/24 1015   Dressing Gauze 07/11/24 1015   Dressing To Be Removed (Date/Time) @1500  07/10/24 07/10/24 0945     Catheter fill volumes:    Arterial: 2.1 mL Venous: 2.1 mL   Catheter filled with Gentamicin  citrate post procedure.     Patient Lines/Drains/Airways Status       Active Peripheral & Central Intravenous Access       Name Placement date Placement time Site Days    Peripheral IV 07/17/24 Posterior;Right Wrist 07/17/24  2123  Wrist  less than 1    Hemodialysis Catheter 05/20/24 Left Internal jugular 2.1 mL 2.1 mL 05/20/24  1611  Internal jugular  59                   LAB RESULTS:  Lab Results   Component Value Date    NA 136 07/18/2024    K 4.9 07/18/2024    CL 94 (L) 07/18/2024    CO2 22.3 07/18/2024    BUN 19 07/18/2024    CREATININE 6.35 (H) 07/18/2024    GLU 188 (H) 07/18/2024    GLUF 138 10/06/2014    CALCIUM  9.1 07/18/2024    CAION 3.67 (L) 02/02/2024    ICAV 4.88 03/04/2012    PHOS 3.6 07/18/2024    MG 2.2 07/18/2024    PTH 943.9 (H) 05/26/2024    IRON 95 11/09/2022 LABIRON 36 11/09/2022    TRANSFERRIN 212.0 (L) 11/09/2022    FERRITIN 169.8 11/09/2022    TIBC 267.1 11/09/2022     Lab Results   Component Value Date    WBC 4.9 07/18/2024    HGB 13.1 07/18/2024    HCT 38.9 (L) 07/18/2024    PLT 197 07/18/2024    PHART 7.36 03/04/2012    PO2ART 88 03/04/2012    PCO2ART 43 03/04/2012    HCO3ART 23.4 03/04/2012    BEART -1.3 03/04/2012    O2SATART 98.1 03/04/2012    PTINR : 05/06/2012    APTT 30.0 07/17/2024        VITAL SIGNS:    Date/Time Temp Temp src       07/18/24 1835 36.6 ??C (97.9 ??F)  Axillary        Date/Time Pulse BP MAP (mmHg) Patient Position    07/18/24 1850 61  --  pt could not stand whole time  --  Standing     07/18/24 1849 88  133/89  99  Sitting     07/18/24 1847 88  131/100  110  Lying     07/18/24 1835 67  102/78  --  Sitting     07/18/24 1830 76  115/85  --  Sitting     07/18/24 1827 76  107/71  --  Sitting     07/18/24 1821 92  88/63  --  Sitting     07/18/24 1800 88  108/73  --  Sitting     07/18/24 1730 81  117/86  --  Sitting     07/18/24 1700 86  117/73  --  Sitting     07/18/24 1645 80  115/65  --  --     07/18/24 1630 80  114/75  --  Sitting     07/18/24 1600 84  115/73  --  Sitting     07/18/24 1530 80  92/73  --  Sitting  Date/Time Blood Volume Change (%) HCT HGB Critline O2 SAT %    07/18/24 1830 -4.3 %  38.6  13.1  59.2     07/18/24 1827 -5.2 %  38.9  13.2  62.7     07/18/24 1821 -9.7 %  40.9  13.9  50.8     07/18/24 1800 -10 %  41  13.9  56.5     07/18/24 1730 -8.2 %  40.2  13.7  58.4     07/18/24 1700 -5.6 %  39.1  13.3  60.2     07/18/24 1645 -4.7 %  38.7  13.2  59.8     07/18/24 1630 -5.1 %  38.9  13.2  61.2     07/18/24 1600 -4.7 %  38.7  13.2  56.7     07/18/24 1530 -3.4 %  38.2  13  56.7       Date/Time Resp SpO2 O2 Device FiO2 (%) O2 Flow Rate (L/min)    07/18/24 1850 21  100 %  None (Room air)  -- --     07/18/24 1849 18  100 %  None (Room air)  -- --     07/18/24 1847 18  100 %  None (Room air)  -- --     07/18/24 1835 17  --  None (Room air)  -- --     07/18/24 1830 17  --  None (Room air)  -- --     07/18/24 1827 17  --  --  -- --     07/18/24 1730 17  --  None (Room air)  -- --     07/18/24 1700 18  --  None (Room air)  -- --     07/18/24 1630 17  --  None (Room air)  -- --     07/18/24 1600 --  --  None (Room air)  -- --     07/18/24 1530 18  --  None (Room air)  -- --         Date/Time Therapy Number Dialyzer Hemodialysis Line Type All Machine Alarms Passed    07/18/24 1426 2  F-180 (98 mLs)  Adult (142 m/s)  Yes       Date/Time Air Detector Saline Line Double Clampled Hemo-Safe Applied Dialysis Flow (mL/min)    07/18/24 1426 Engaged  --  --  600 mL/min       Date/Time Verify Priming Solution Priming Volume Hemodialysis Independent pH Hemodialysis Machine Conductivity (mS/cm)    07/18/24 1426 0.9% NS  300 mL  --  13.4 mS/cm       Date/Time Hemodialysis Independent Conductivity (mS/cm) Bicarb Conductivity Residual Bleach Negative Total Chlorine    07/18/24 1426 13.4 mS/cm  -- Yes  0       Date/Time Pre-Hemodialysis Comments    07/18/24 1426 Alert, arrived in [facility]       Date/Time Blood Flow Rate (mL/min) Arterial Pressure (mmHg) Venous Pressure (mmHg) Transmembrane Pressure (mmHg)    07/18/24 1835 0 mL/min  54 mmHg  4 mmHg  35 mmHg     07/18/24 1830 150 mL/min  -232 mmHg  154 mmHg  1 mmHg     07/18/24 1827 400 mL/min  -232 mmHg  160 mmHg  -1 mmHg     07/18/24 1821 400 mL/min  -252 mmHg  193 mmHg  17 mmHg     07/18/24 1800 400 mL/min  -247 mmHg  185 mmHg  15 mmHg     07/18/24  1730 400 mL/min  -236 mmHg  185 mmHg  9 mmHg     07/18/24 1700 400 mL/min  -228 mmHg  175 mmHg  9 mmHg     07/18/24 1645 400 mL/min  -224 mmHg  170 mmHg  5 mmHg     07/18/24 1630 400 mL/min  -226 mmHg  176 mmHg  4 mmHg     07/18/24 1600 400 mL/min  -230 mmHg  177 mmHg  -2 mmHg     07/18/24 1530 400 mL/min  -223 mmHg  179 mmHg  -1 mmHg     07/18/24 1500 400 mL/min  -224 mmHg  176 mmHg  6 mmHg     07/18/24 1445 400 mL/min  -219 mmHg  174 mmHg  12 mmHg     07/18/24 1440 400 mL/min -220 mmHg  180 mmHg  20 mmHg     07/18/24 1435 400 mL/min  48 mmHg  89 mmHg  20 mmHg       Date/Time Ultrafiltration Rate (mL/hr) Ultrafiltrate Removed (mL) Dialysate Flow Rate (mL/min) KECN Marna)    07/18/24 1835 0 mL/hr  1992 mL  600 ml/min  --     07/18/24 1830 0 mL/hr  1992 mL  600 ml/min  --     07/18/24 1827 0 mL/hr  1992 mL  600 ml/min  --     07/18/24 1821 0 mL/hr   1976 mL  600 ml/min  --     07/18/24 1800 1320 mL/hr  1506 mL  600 ml/min  --     07/18/24 1730 890 mL/hr  912 mL  600 ml/min  --     07/18/24 1700 590 mL/hr  517 mL  600 ml/min  --     07/18/24 1645 330 mL/hr  392 mL  600 ml/min  --     07/18/24 1630 330 mL/hr  310 mL  600 ml/min  --     07/18/24 1600 140 mL/hr  175 mL  600 ml/min  --     07/18/24 1530 140 mL/hr  106 mL  600 ml/min  --     07/18/24 1500 140 mL/hr  37 mL  0 ml/min  --     07/18/24 1445 140 mL/hr  2 mL  600 ml/min  --     07/18/24 1440 0 mL/hr  0 mL  800 ml/min  --     07/18/24 1435 0 mL/hr  0 mL  500 ml/min  --       Date/Time Intra-Hemodialysis Comments    07/18/24 1830 Pt requested to come off the machine  TX terminated, rinse back given     07/18/24 1827 Pt recovered, BP normal. UF remains paused     07/18/24 1821 Pt feeling impending sense of doom. BP checkeD hypotensive. UF paused. Saline given.      07/18/24 1800 HD cont     07/18/24 1730 pt stable     07/18/24 1700 bp improving goal increased to 1.5 liters     07/18/24 1645 uf goal increased to 1 liter due to improved bp and flat critline     07/18/24 1630 VSS     07/18/24 1600 bp improved, uf goal increased to , will continue to monitor     07/18/24 1530 pt talking on phone, bps soft bpsno fluid remival     07/18/24 1500 Denies needs     07/18/24 1445 HD cont     07/18/24 1440 treatment initiated     07/18/24 1435  pre HD vitals       Date/Time Rinseback Volume (mL) On Line Clearance: spKt/V Total Liters Processed (L/min) Dialyzer Clearance    07/18/24 1830 300 mL  0.83 spKt/V  63.3 L/min  Moderately streaked Date/Time Post-Hemodialysis Comments    07/18/24 1830 Alert and stable       Date/Time Total Hemodialysis Replacement Volume (mL) Total Ultrafiltrate Output (mL)    07/18/24 1830 --  1200 mL        [id number] - Medicaitons Given During Treatment  (last 4 hrs)           NGAFI, GIWEH, RN         Medication Name Action Time Action Route Rate Dose User     gentamicin -sodium citrate  lock solution in NS 07/18/24 1832 Given Intra-cannular  2.1 mL Jeannie Patches, RN     gentamicin -sodium citrate  lock solution in NS 07/18/24 1832 Given Intra-cannular  2.1 mL Jeannie Patches, RN            POULSEN, VIVIEN R, RN         Medication Name Action Time Action Route Rate Dose User     insulin  lispro (HumaLOG ) injection 10 Units 07/18/24 1854 Refused Subcutaneous  10 Units Poulsen, Vivien R, RN     insulin  lispro (HumaLOG ) injection CORRECTIONAL 0-20 Units 07/18/24 1853 Not Given Subcutaneous   Poulsen, Vivien R, RN     sevelamer  (RENVELA ) tablet 2,400 mg 07/18/24 1853 Given Oral  2,400 mg Poulsen, Vivien R, RN                      Patient tolerated treatment in a  Dialysis Recliner.

## 2024-07-18 NOTE — Progress Notes (Signed)
 Daily Progress Note    Assessment/Plan:    Active Problems:    * No active hospital problems. *        Wound 05/20/24 chg gel and transparent dressing in place before PACU arrival (Active)   Dressing Status      Edges lifted 05/20/24 1730   Site Assessment Bleeding 05/20/24 1730   Dressing Other (Comment) 05/20/24 1720       Wound 06/23/24 Surgical Catheter Entry Leg Anterior;Proximal;Right;Upper (Active)   Dressing Status      Clean;Dry;Intact/not removed 07/11/24 0915   Peri-wound Assessment      Soft 06/23/24 1700   Dressing Transparent film dressing;Dry gauze 06/23/24 2200   Dressing Changed Reinforced 06/23/24 2200           Richard [name] is a 59 y.o. 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     Chest pain, mild troponin elevation, episodes of lightheadedness  - CT aortic dissection protocol negative.  - no acute changes on ECG.  Mild elevation in trop  - cardiology consulted appreciate recommendations and to follow-up outpatient and consider cath if symptoms ongoing  - monitor on telemetry  - continue carvedilol  3.125 per cardiology's recommendation, would uptitrate if BP tolerates  - continue aspirin  and apixaban   - restarted atorvastatin  40 mg nightly (unclear why stopped)     Abdominal pain exacerbated by exertion, fluid shifts umbilical hernia-received extensive evaluation last hospitalization for this.  Evaluation has included several CTs of the abdomen and pelvis, CT mesenteric ischemia protocol, EGD in late July, surgery evaluation.  Surgery saw him last admission and did not recommend surgery for hernia considering small size, lack of bowel involvement and comorbid conditions.  So far, there is not a clear etiology of the symptoms.  Does report symptoms are exacerbated by stress.  The fact that the pain is not worsened by food intake makes mesenteric ischemia less likely. SIBO felt less likely since patient is on chronic rifaximin .  Overall,since symptoms are stable and improving this admission recently received an extensive evaluation last admissionand  will not pursue further abdominal imaging at this time as long as remains clinically stable without changes in abd exam.  - Lactate came back elevated at 3.6 on admission  Per review has had some chronic lactate elevations in the past.  Lactate normalized without intervention. In the setting of severe chest pain with concurrent abdominal pain and elevated lactate  CTA aortic dissection protocol obtained and was negative this admission.    - continue supportive care for this  - pain control with scheduled Tylenol , as needed oxycodone  , hydromorphone  breakthrough pain, palliative care was consulted last admission  - will try to encourage limiting opioid pain medications in the setting of chronic pain     Splenic artery aneurysm, possible stent occlusion  - underwent recent stenting of 2.6 cm hilar splenic artery aneurysm on 12/29.  Last CT scan on 113 showed lack of opacification of the stent concerning for possible stent occlusion.  Discussed with radiology and per radiology no need for intervention since there is still adequate perfusion of the spleen and the psuedoaneurysm appears completely thrombosed and excluded   - continue aspirin /apixiban  Lactic acidosis-after admission lactate came back elevated at 3.6 after HD.  Per review had some mildly elevated lactate last hospitalization (2.1-3.3).  Wonder if this is a response of the body with poor tolerance to HD where the shunting of fluids during HD causes relative hypoperfusion to other tissues leading to this phenomenon.  - blood cultures pending  - lactate normalized  - CTA aortic dissection negative     [name] Cirrhosis s/p OLT (02/2012)  Established with [facility] (Dr. Lennette). No history of rejection or biliary issues. however has had multiple prior admissions for hepatic encephalopathy 01/2024 MR elastography with stage 3 fibrosis.  -Would discuss with GI if decompensation occurs   - Continue tacrolimus  0.5mg  at bedtime and 1 mg qam (goal 2-4)   - Continue cellcept  250mg  BID  - Continue ursodiol  600mg  BID   - Continue rifaximin  BID     ESRD on iHD TTS - Hyperkalemia   Etiology of ESRD 2/2 CNI toxicity and HTN.  Is currently dialyzing through left tunnel internal jugular  iHD line.  Has AV fistula but increased swelling  around site after infiltration events and now dialyzing through tunneled line. PVL duplex earlier in January showed patent fistula but cannulation remain challenging due to edema last hospitalization.   - repeat duplex of the arm with increased swelling around fistula site showed Patent AVF with velocity elevations at the area of narrowing.  There was no fluid collection noted at the patient area of concern  - Nephrology consulted for HD management  - continue sevelemer 800mg  TID   - continue vitamin D supplement      T2DM  Patient reports sliding scale for both his long acting and meal time insulin .  Not adequately controlled at baseline with last hemoglobin A1c of 10.6 (04/2024) Reports more recently he takes about 30 units of long-acting insulin , between 10 to 30 units with meals based on blood sugars, mounjaro  15mg  weekly.   - Basal: 20U lantus    - Nutritional: 10U lispro   - Correctional: SSI   - POC ACHS     Prolonged QTc-in the setting of bundle branch block  - Minimize QTc prolonging agents as able     Prophylaxis  -Apixiban      Diet  -Nutrition Therapy Regular/House; Fluid 2000 ml     Code Status / HCDM  -Full Code,    -  HCDM (patient stated preference): Richard [name] - Sister - [phone]    I personally spent 55 minutes face-to-face and non-face-to-face in the care of this patient, which includes all pre, intra, and post visit E/M time on the date of service. All documented time was specific to the E/M visit and does not include any pre, intra, post procedure related time.  ___________________________________________________________________    Subjective:  No acute events overnight.  Pt reports no cp this am.  Wants to go to dialysis this am and asking for a letter for his lawyer due to court appearance.  Tolerating po.  Discuss case with cards and nephrology team    Labs/Studies:  Recent Results (from the past 24 hours)   POCT Glucose    Collection Time: 07/17/24  4:13 PM   Result Value Ref Range    Glucose, POC 263 (H) 70 - 179 mg/dL   hsTroponin I - 6 Hour    Collection Time: 07/17/24  4:23 PM   Result Value Ref Range    hsTroponin I 51 <=53 ng/L    delta hsTroponin I 3 <=  7 ng/L   Lactate, Venous, Whole Blood    Collection Time: 07/17/24  4:23 PM   Result Value Ref Range    Lactate, Venous 3.6 (H) 0.5 - 1.8 mmol/L   ECG 12 Lead    Collection Time: 07/17/24  4:35 PM   Result Value Ref Range    EKG Systolic BP  mmHg    EKG Diastolic BP  mmHg    EKG Ventricular Rate 87 BPM    EKG Atrial Rate 87 BPM    EKG P-R Interval 240 ms    EKG QRS Duration 146 ms    EKG Q-T Interval 454 ms    EKG QTC Calculation 546 ms    EKG Calculated P Axis -63 degrees    EKG Calculated R Axis 253 degrees    EKG Calculated T Axis 1 degrees    QTC Fredericia 513 ms   POCT Glucose    Collection Time: 07/17/24  6:48 PM   Result Value Ref Range    Glucose, POC 196 (H) 70 - 179 mg/dL   POCT Glucose    Collection Time: 07/17/24  8:57 PM   Result Value Ref Range    Glucose, POC 268 (H) 70 - 179 mg/dL   hsTroponin I (single, no delta)    Collection Time: 07/17/24  9:52 PM   Result Value Ref Range    hsTroponin I 55 (HH) <=53 ng/L   Lactate, Venous, Whole Blood    Collection Time: 07/17/24  9:52 PM   Result Value Ref Range    Lactate, Venous 4.0 (H) 0.5 - 1.8 mmol/L POCT Glucose    Collection Time: 07/17/24 10:03 PM   Result Value Ref Range    Glucose, POC 293 (H) 70 - 179 mg/dL   CBC    Collection Time: 07/18/24  6:42 AM   Result Value Ref Range    WBC 4.9 3.6 - 11.2 10*9/L    RBC 4.60 4.26 - 5.60 10*12/L    HGB 13.1 12.9 - 16.5 g/dL    HCT 61.0 (L) 60.9 - 48.0 %    MCV 84.6 77.6 - 95.7 fL    MCH 28.4 25.9 - 32.4 pg    MCHC 33.6 32.0 - 36.0 g/dL    RDW 84.6 (H) 87.7 - 15.2 %    MPV 8.6 6.8 - 10.7 fL    Platelet 197 150 - 450 10*9/L   Comprehensive metabolic panel    Collection Time: 07/18/24  6:42 AM   Result Value Ref Range    Sodium 136 135 - 145 mmol/L    Potassium 4.9 3.5 - 5.1 mmol/L    Chloride 94 (L) 98 - 107 mmol/L    CO2 22.3 20.0 - 31.0 mmol/L    Anion Gap 20 (H) 5 - 14 mmol/L    BUN 19 9 - 23 mg/dL    Creatinine 3.64 (H) 0.73 - 1.18 mg/dL    BUN/Creatinine Ratio 3     eGFR CKD-EPI (2021) Male 10 (L) >=60 mL/min/1.17m2    Glucose 188 (H) 70 - 179 mg/dL    Calcium  9.1 8.7 - 10.4 mg/dL    Albumin 3.3 (L) 3.4 - 5.0 g/dL    Total Protein 7.0 5.7 - 8.2 g/dL    Total Bilirubin 0.6 0.3 - 1.2 mg/dL    AST 20 <=65 U/L    ALT 7 (L) 10 - 49 U/L    Alkaline Phosphatase 193 (H) 46 - 116 U/L   Magnesium  Level  Collection Time: 07/18/24  6:42 AM   Result Value Ref Range    Magnesium  2.2 1.6 - 2.6 mg/dL   Phosphorus Level    Collection Time: 07/18/24  6:42 AM   Result Value Ref Range    Phosphorus 3.6 2.4 - 5.1 mg/dL   hsTroponin I (single, no delta)    Collection Time: 07/18/24  6:42 AM   Result Value Ref Range    hsTroponin I 53 <=53 ng/L   Tacrolimus  Level, Trough    Collection Time: 07/18/24  6:47 AM   Result Value Ref Range    Tacrolimus , Trough 5.5 5.0 - 15.0 ng/mL   POCT Glucose    Collection Time: 07/18/24  7:03 AM   Result Value Ref Range    Glucose, POC 213 (H) 70 - 179 mg/dL   POCT Glucose    Collection Time: 07/18/24 12:41 PM   Result Value Ref Range    Glucose, POC 219 (H) 70 - 179 mg/dL     Objective:  Temp:  [36.1 ??C (97 ??F)-37.1 ??C (98.8 ??F)] 36.8 ??C (98.2 ??F)  Pulse:  [69-105] 86  Resp:  [17-20] 18  BP: (105-135)/(65-116) 112/72  SpO2:  [98 %-100 %] 98 %    GEN: disheveled male with central obesity in NAD, lying in bed  HEENT: EOMI MMM  CV: RRR. Dialysis cath  PULM: CTA B  ABD: soft, NT/ND, +BS  EXT: No edema. Right arm fistula with thrill.  NEURO: alert and oriented x 3.  No focal neuro deficits

## 2024-07-18 NOTE — Progress Notes (Signed)
 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      #Chest Pain  #Shortness of Breath  #CAD  #HFpEF  Symptoms are nonexertional and last sometimes seconds and other times hours. Troponin has been mildly elevated 50's but stable. EKG does not show acute ischemic changes. Unlikely to be ACS though cannot rule out angina contributing to symptoms.   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      Patient had chest pain overnight and it resolved in the morning. He went to HD and felt SOB so he was sent to the hospital. Patient has not had CP or SOB recently. He does have chronic intermittent severe abdominal pain which was previously attributed to umbilical hernia. Patient had recent splenic artery aneurysm stenting and is on aspirin . He is mostly sedentary.     SPECT MPI 05/2023 showed moderate sized, mild in severity, fixed defect in the anterior and apical segments which may represent artifact or scar. Coronary artery calcification was noted. TTE 07/09/24 showed LVEF 65-70% without significant valve pathology. EKG 07/17/24 showed ectopic atrial rhythm, RBBB, LAFB. Trop 59 -> 54.     Allergies[1]    Medications:   Prior to Admission medications   Medication Sig Start Date End Date Taking? Authorizing Provider   apixaban  (ELIQUIS ) 5 mg Tab Take 1 tablet (5 mg total) by mouth two (2) times a day. 02/23/24 05/18/25  Baez Marinez, Lazarina D, FNP   apixaban  (ELIQUIS ) 5 mg Tab Take 1 tablet (5 mg total) by mouth two (2) times a day. 06/24/24   Lindon Jenne Jenkins, FNP   aspirin  81 MG chewable tablet Chew 1 tablet (81 mg total) daily. Stop taking after 30 days (when you finish this bottle). 06/24/24 07/24/24  Somera, Cherie Ann, FNP   blood sugar diagnostic (GLUCOSE BLOOD) Strp Use to check blood sugar as directed with insulin  3 times a day and for symptoms of high or low blood sugar. 04/18/24   Leonce Lacks, MD   blood-glucose meter kit Use as directed to check blood sugars 04/18/24 04/18/25  Leonce Lacks, MD   blood-glucose meter kit Use as instructed 04/18/24 04/18/25  Leonce Lacks, MD   blood-glucose meter,continuous (DEXCOM G6 RECEIVER) Misc 1 each by Miscellaneous route in the morning. Dispense 1 receiver annually.  Sent to [facility]. 04/03/23   Duggan, Patrick, MD   blood-glucose sensor (DEXCOM G7 SENSOR) Devi Use to continuously monitor blood glucose.  Change sensor every  10 days. 01/14/24   Iglesia, Delfin Abano, MD   calcitriol  (ROCALTROL ) 0.5 MCG capsule Take 1 capsule (0.5 mcg total) by mouth 3 (three) times a week in dialysis (for use in dialysis). 06/24/24 09/22/24  Somera, Cherie Ann, FNP   CHOLECALCIFEROL , VITAMIN D3, (VITAMIN D3 ORAL) Take 2,000 Units by mouth in the morning.    [provider]   gabapentin  (NEURONTIN ) 300 MG capsule Take 1 capsule (300 mg total) by mouth nightly. 10/16/23 11/09/24  Baez Marinez, Lazarina D, FNP   insulin  aspart (NOVOLOG  FLEXPEN U-100 INSULIN ) 100 unit/mL (3 mL) injection pen Inject 0.3 mL (30 Units total) under the skin Three (3) times a day before meals. 04/18/24 07/17/24 Leonce Lacks, MD   insulin  degludec (TRESIBA  FLEXTOUCH U-200) 200 unit/mL (3 mL) InPn Inject 0.35 mL (70 Units total) under the skin daily. Max dose of 100 units per day.     If fasting glucose is greater than 180, increase basal insulin  by 5 units. Recheck fasting glucose the following morning and continue to increase by 5 units daily until fasting sugar less than 180.  Patient taking differently: Inject 0.35 mL (70 Units total) under the skin daily. Max dose of 100 units per day. Less at times per patient    If fasting glucose is greater than 180, increase basal insulin  by 5 units. Recheck fasting glucose the following morning and continue to increase by 5 units daily until fasting sugar less than 180. 04/18/24 07/17/24  Leonce Lacks, MD   lancets Misc Use to check blood sugar as directed with insulin  3 times a day and for symptoms of high or low blood sugar. 04/18/24   Leonce Lacks, MD   mycophenolate  (CELLCEPT ) 250 mg capsule Take 1 capsule (250 mg total) by mouth two (2) times a day. 02/13/24   Deutsch-Link, Vena, MD   pantoprazole  (PROTONIX ) 40 MG tablet Take 1 tablet (40 mg total) by mouth daily as needed.    [provider]   pen needle, diabetic (TRUEPLUS PEN NEEDLE) 32 gauge x 5/32 (4 mm) Ndle Use with insulin  up to 4 times a day as needed. 11/21/23   Duggan, Patrick, MD   pen needle, diabetic 32 gauge x 5/32 (4 mm) Ndle Use with insulin  up to 4 times daily as needed. 04/18/24   Leonce Lacks, MD   rifAXIMin  (XIFAXAN ) 550 mg Tab Take 1 tablet (550 mg total) by mouth two (2) times a day.    [provider]   sevelamer  (RENVELA ) 800 mg tablet Take 3 tablets (2,400 mg total) by mouth Three (3) times a day with a meal. 05/28/24 08/26/24  Raguel Mallick, MD   tacrolimus  (PROGRAF ) 0.5 MG capsule Take 1 capsule (0.5 mg total) by mouth two (2) times a day. 06/09/24   Deutsch-Link, Vena, MD   tirzepatide  (MOUNJARO ) 15 mg/0.5 mL PnIj Inject 15 mg under the skin every seven (7) days. 07/01/24 Jama Mayo, MD   ursodiol  (ACTIGALL ) 300 mg capsule Take 2 capsules (600 mg total) by mouth two (2) times a day.    [provider]     Past Medical History[2]    Past Surgical History[3]    Social History:  Tobacco Use History[4]  Social History     Substance and Sexual Activity   Alcohol Use Never     Social History     Substance and Sexual Activity   Drug Use Never     Family History[5]    Code Status:  Full Code  Review of Systems: All positive and pertinent negatives are noted in the HPI; otherwise all other systems are negative.    Objective:     Physical Exam:  Temp:  [35.9 ??C (96.6 ??F)-37.1 ??C (98.8 ??F)] 36.8 ??C (98.2 ??F)  Pulse:  [69-114] 87  SpO2 Pulse:  [94-105] 104  Resp:  [17-20] 18  BP: (105-167)/(65-116) 107/76  SpO2:  [98 %-100 %] 98 %  General:  Resting comfortably in NAD  Neuro:  Alert & oriented X 3.  Follows commands  Chest:  Resp even and nonlabored.  Lungs sounds clear throughout  CV:  S1S2, RRR, no murmurs, gallops or rubs, chest wall non-tender  Ext:  Warm, no edema  MSK:  Moving all extremities.  No obvious limitations or significant deficits    Labs:   Lab Results   Component Value Date    CKTOTAL 83.0 04/08/2024    CKTOTAL 264.0 (H) 08/29/2021    TROPONINI 53 07/18/2024    TROPONINI 55 (HH) 07/17/2024    TROPONINI 51 07/17/2024     Recent Labs     Units 07/18/24  0642   WBC 10*9/L 4.9   RBC 10*12/L 4.60   HGB g/dL 86.8   HCT % 61.0*   MCV fL 84.6   MCH pg 28.4   MCHC g/dL 66.3   RDW % 84.6*   PLT 10*9/L 197   MPV fL 8.6   .  Recent Labs     Units 07/18/24  0642   NA mmol/L 136   K mmol/L 4.9   CL mmol/L 94*   BUN mg/dL 19   CREATININE mg/dL 3.64*   GLU mg/dL 811*     Lab Results   Component Value Date    LDL Cholesterol, Calculated 85 04/15/2012    Cholesterol, LDL, Calculated 67 07/17/2024    Cholesterol, Non-HDL, Calculated 96 07/17/2024    HDL 37 (L) 03/16/2014    Cholesterol, HDL 27 (L) 07/17/2024    INR 1.22 07/17/2024    INR 1.0 05/23/2012    BNP 603 (H) 11/16/2022 [1] No Known Allergies  [2]   Past Medical History:  Diagnosis Date    CHF (congestive heart failure) (CMS-HCC)     COVID-19 07/18/2019    Diabetes mellitus (CMS-HCC)     Family history of malignant neoplasm of prostate 06/23/2015    Heart disease     Heart murmur     Hepatic cirrhosis    (CMS-HCC) 06/23/2015    Overview:  Secondary to NASH; followed by Dr. Lindaann, GI.  Last Assessment & Plan:  Relevant Hx: Course: Daily Update: Today's Plan:     Hypertension     Hypogonadism in male 02/12/2014    Kidney stone     Liver cirrhosis secondary to NASH (nonalcoholic steatohepatitis) (CMS-HCC)     s/p liver transplant 2013    Obesity    [3]   Past Surgical History:  Procedure Laterality Date    CHOLECYSTECTOMY      liver transpant      LIVER TRANSPLANTATION  06/27/2011    PR ANASTOMOSIS,AV,ANY SITE Right 02/20/2024    Procedure: ARTERIOVENOUS ANASTOMOSIS, OPEN; DIRECT, ANY SITE, UPPER EXTREMITY;  Surgeon: Marchelle Kitchens, MD;  Location: [facility] OR [facility];  Service: General Surgery    PR AV ANAST,UP ARM BASILIC VEIN TRANSPOSIT Right 03/31/2024    Procedure: ARTERIOVENOUS ANASTOMOSIS, OPEN; BY UPPER ARM BASILIC VEIN TRANSPOSITION;  Surgeon: Marchelle Kitchens, MD;  Location: [facility] OR [facility];  Service: General Surgery  PR AV FIST REVISE GRFT,W THROMBECTOMY Right 01/15/2023    Procedure: REVISION, ARTERIOVENOUS FISTULA W/ THROMBECTOMY, AUTOGENOUS OR NONAUTOGENOUS DIALYSIS GRAFT (SEP. PROC), LOWER EXTREMITY;  Surgeon: Marchelle Kitchens, MD;  Location: [facility] OR [facility];  Service: General Surgery    PR COLONOSCOPY FLX DX W/COLLJ SPEC WHEN PFRMD N/A 01/24/2024    Procedure: COLONOSCOPY, FLEXIBLE, PROXIMAL TO SPLENIC FLEXURE; DIAGNOSTIC, W/WO COLLECTION SPECIMEN BY BRUSH OR WASH;  Surgeon: Erick Prentice HERO, MD;  Location: [facility];  Service: Gastroenterology    PR COLONOSCOPY W/BIOPSY SINGLE/MULTIPLE N/A 08/13/2018    Procedure: COLONOSCOPY, FLEXIBLE, PROXIMAL TO SPLENIC FLEXURE; WITH BIOPSY, SINGLE OR MULTIPLE;  Surgeon: Thedora Alm Plain, MD;  Location: GI [facility];  Service: Gastroenterology    PR COLSC FLX W/RMVL OF TUMOR POLYP LESION SNARE TQ N/A 08/13/2018    Procedure: COLONOSCOPY FLEX; W/REMOV TUMOR/LES BY SNARE;  Surgeon: Thedora Alm Plain, MD;  Location: GI [facility];  Service: Gastroenterology    PR CREAT AV FISTULA,NON-AUTOGENOUS GRAFT Right 11/29/2022    Procedure: CREATE AV FISTULA (SEPARATE PROC); NONAUTOGENOUS GRAFT (EG, BIOLOGICAL COLLAGEN, THERMOPLASTIC GRAFT), UPPER EXTREMITY;  Surgeon: Marchelle Kitchens, MD;  Location: [facility] OR [facility];  Service: General Surgery    PR UPPER GI ENDOSCOPY,BIOPSY N/A 07/13/2015    Procedure: UGI ENDOSCOPY; WITH BIOPSY, SINGLE OR MULTIPLE;  Surgeon: Elspeth Jerilynn Reek, MD;  Location: GI [facility]URES [facility];  Service: Gastroenterology    PR UPPER GI ENDOSCOPY,BIOPSY N/A 02/13/2023    Procedure: UGI ENDOSCOPY; WITH BIOPSY, SINGLE OR MULTIPLE;  Surgeon: Minnie Krystal Claude, MD;  Location: GI [facility];  Service: Gastroenterology    PR UPPER GI ENDOSCOPY,DIAGNOSIS N/A 01/24/2024    Procedure: UGI ENDO, INCLUDE ESOPHAGUS, STOMACH, & DUODENUM &/OR JEJUNUM; DX W/WO COLLECTION SPECIMN, BY BRUSH OR WASH;  Surgeon: Erick Prentice HERO, MD;  Location: GI [facility];  Service: Gastroenterology   [4]   Social History  Tobacco Use   Smoking Status Never    Passive exposure: Past   Smokeless Tobacco Never   [5]   Family History  Problem Relation Age of Onset    Diabetes Father     Diabetes Paternal Grandfather     Liver disease Maternal Uncle     Cancer Maternal Grandmother     Melanoma Neg Hx     Basal cell carcinoma Neg Hx     Squamous cell carcinoma Neg Hx     Kidney disease Neg Hx

## 2024-07-18 NOTE — Progress Notes (Signed)
 [facility] Nephrology Hemodialysis Procedure Note     07/18/2024    Richard [name] was seen and examined on hemodialysis    CHIEF COMPLAINT: End Stage Kidney Disease    INTERVAL HISTORY: Seen at the dialysis unit.  Sitting comfortably in recliner.  Concerned about his abdominal pain, which seems intermittent, and moderate in intensity.  As per patient, ongoing pain management has not improved it.  Otherwise feeling good, tolerating dialysis well.  Blood pressure on the low side but asymptomatic.    DIALYSIS TREATMENT DATA:  Estimated Dry Weight (kg): 115 kg (253 lb 8.5 oz) Patient Goal Weight (kg): 2.5 kg (5 lb 8.2 oz)   Pre-Treatment Weight (kg): 114 kg (251 lb 5.2 oz)    Dialysis Bath  Bath: 2 K+ / 2.5 Ca+  Dialysate Na (mEq/L): 137 mEq/L  Dialysate HCO3 (mEq/L): 35 mEq/L Dialyzer: F-180 (98 mLs)   Blood Flow Rate (mL/min): 400 mL/min Dialysis Flow (mL/min): 600 mL/min   Machine Temperature (C): 36.5 ??C (97.7 ??F)      PHYSICAL EXAM:  Vitals:  Temp:  [36.7 ??C (98.1 ??F)-37.1 ??C (98.8 ??F)] 36.7 ??C (98.1 ??F)  Pulse:  [60-104] 84  BP: (81-132)/(58-116) 115/73  MAP (mmHg):  [66-123] 66    General: in no acute distress, currently dialyzing in a Hemodialysis Recliner  Pulmonary: normal respiratory effort  Cardiovascular: regular rate and rhythm  Extremities: trace  edema  Access: Right IJ tunneled catheter     LAB DATA:  Lab Results   Component Value Date    NA 136 07/18/2024    K 4.9 07/18/2024    CL 94 (L) 07/18/2024    CO2 22.3 07/18/2024    BUN 19 07/18/2024    CREATININE 6.35 (H) 07/18/2024    GLUCOSE 196 03/03/2012    CALCIUM  9.1 07/18/2024    MG 2.2 07/18/2024    PHOS 3.6 07/18/2024    ALBUMIN 3.3 (L) 07/18/2024      Lab Results   Component Value Date    HCT 38.9 (L) 07/18/2024    WBC 4.9 07/18/2024        ASSESSMENT/PLAN:  End Stage Renal Disease on Intermittent Hemodialysis:  UF goal:1-23L as tolerated. EDW has changed. New EDW is 115kg.  Estimated dry weight at the facility is 116 kg however patient seems like has been losing weight.  On Ozempic .  Currently underweight, down to 114 kg  Per outpatient nephrologist, he has been challenged recently.  Is unclear if symptoms are related to fluid overload.  Adjust medications for a GFR <10  Avoid nephrotoxic agents  Last HD Treatment:Started (07/18/24)     Bone Mineral Metabolism:  Lab Results   Component Value Date    CALCIUM  9.1 07/18/2024    CALCIUM  9.0 07/17/2024    Lab Results   Component Value Date    ALBUMIN 3.3 (L) 07/18/2024    ALBUMIN 3.3 (L) 07/17/2024      Lab Results   Component Value Date    PHOS 3.6 07/18/2024    PHOS 3.7 07/17/2024    Lab Results   Component Value Date    PTH 943.9 (H) 05/26/2024    PTH 267.5 (H) 10/14/2021      Continue phosphorus binder and dietary counseling.  continue Renvela  1600mg  with meals for hyperphosphatemia.    Anemia:   Lab Results   Component Value Date    HGB 13.1 07/18/2024    HGB 12.6 (L) 07/17/2024    HGB 11.9 (L) 07/08/2024  Iron Saturation (%)   Date Value Ref Range Status   11/09/2022 36 % Final   05/15/2012 39 20 - 50 % Final      Lab Results   Component Value Date    FERRITIN 169.8 11/09/2022       Anemia labs appropriate, no changes.    Vascular Access:  Vascular Access functioning well - no need for intervention  Blood Flow Rate (mL/min): 400 mL/min    IV Antibiotics to be administered at discharge:  No    This procedure was fully reviewed with the patient and/or their decision-maker. The risks, benefits, and alternatives were discussed prior to the procedure. All questions were answered and written informed consent was obtained.    Hadassah Marit Demaris Virgia, MD  [facility] Division of Nephrology & Hypertension

## 2024-07-19 LAB — TACROLIMUS LEVEL, TROUGH: TACROLIMUS, TROUGH: 5.3 ng/mL (ref 5.0–15.0)

## 2024-07-19 MED ORDER — HYDROMORPHONE 2 MG TABLET
ORAL_TABLET | Freq: Three times a day (TID) | ORAL | 0 refills | 4.00000 days | Status: CP | PRN
Start: 2024-07-19 — End: 2024-07-24
  Filled 2024-07-19: qty 12, 4d supply, fill #0

## 2024-07-19 MED ADMIN — insulin lispro (HumaLOG) injection CORRECTIONAL 0-20 Units: 0-20 [IU] | SUBCUTANEOUS | @ 02:00:00

## 2024-07-19 MED ADMIN — mycophenolate (CELLCEPT) capsule 250 mg: 250 mg | ORAL | @ 02:00:00

## 2024-07-19 MED ADMIN — mycophenolate (CELLCEPT) capsule 250 mg: 250 mg | ORAL | @ 16:00:00 | Stop: 2024-07-19

## 2024-07-19 MED ADMIN — gabapentin (NEURONTIN) capsule 300 mg: 300 mg | ORAL | @ 02:00:00

## 2024-07-19 MED ADMIN — calcitriol (ROCALTROL) capsule 0.5 mcg: .5 ug | ORAL | @ 14:00:00 | Stop: 2024-07-19

## 2024-07-19 MED ADMIN — apixaban (ELIQUIS) tablet 5 mg: 5 mg | ORAL | @ 02:00:00

## 2024-07-19 MED ADMIN — apixaban (ELIQUIS) tablet 5 mg: 5 mg | ORAL | @ 14:00:00 | Stop: 2024-07-19

## 2024-07-19 MED ADMIN — cholecalciferol (vitamin D3 25 mcg (1,000 units)) tablet 50 mcg: 50 ug | ORAL | @ 14:00:00 | Stop: 2024-07-19

## 2024-07-19 MED ADMIN — rifAXIMin (XIFAXAN) tablet 550 mg: 550 mg | ORAL | @ 02:00:00 | Stop: 2024-10-24

## 2024-07-19 MED ADMIN — rifAXIMin (XIFAXAN) tablet 550 mg: 550 mg | ORAL | @ 14:00:00 | Stop: 2024-07-19

## 2024-07-19 MED ADMIN — melatonin tablet 3 mg: 3 mg | ORAL | @ 05:00:00 | Stop: 2024-07-19

## 2024-07-19 MED ADMIN — carvedilol (COREG) tablet 3.125 mg: 3.125 mg | ORAL | @ 14:00:00 | Stop: 2024-07-19

## 2024-07-19 MED ADMIN — HYDROmorphone (DILAUDID) injection Syrg 0.5 mg: .5 mg | INTRAVENOUS | @ 07:00:00 | Stop: 2024-07-19

## 2024-07-19 MED ADMIN — HYDROmorphone (DILAUDID) injection Syrg 0.5 mg: .5 mg | INTRAVENOUS | @ 04:00:00 | Stop: 2024-07-24

## 2024-07-19 MED ADMIN — HYDROmorphone (DILAUDID) injection Syrg 0.5 mg: .5 mg | INTRAVENOUS | @ 10:00:00 | Stop: 2024-07-19

## 2024-07-19 MED ADMIN — HYDROmorphone (DILAUDID) injection Syrg 0.5 mg: .5 mg | INTRAVENOUS | @ 13:00:00 | Stop: 2024-07-19

## 2024-07-19 MED ADMIN — HYDROmorphone (DILAUDID) injection Syrg 0.5 mg: .5 mg | INTRAVENOUS | @ 17:00:00 | Stop: 2024-07-19

## 2024-07-19 MED ADMIN — ursodiol (ACTIGALL) capsule 600 mg: 600 mg | ORAL | @ 14:00:00 | Stop: 2024-07-19

## 2024-07-19 MED ADMIN — ursodiol (ACTIGALL) capsule 600 mg: 600 mg | ORAL | @ 02:00:00

## 2024-07-19 MED ADMIN — tacrolimus (PROGRAF) capsule 0.5 mg: .5 mg | ORAL | @ 02:00:00

## 2024-07-19 MED ADMIN — sodium chloride 0.9% (NS) bolus 250 mL: 250 mL | INTRAVENOUS | @ 07:00:00 | Stop: 2024-07-19

## 2024-07-19 MED ADMIN — insulin glargine (LANTUS) injection BASAL 20 Units: 20 [IU] | SUBCUTANEOUS | @ 04:00:00

## 2024-07-19 MED ADMIN — thiamine (B-1) injection 200 mg: 200 mg | INTRAVENOUS | @ 14:00:00 | Stop: 2024-07-19

## 2024-07-19 MED ADMIN — aspirin chewable tablet 81 mg: 81 mg | ORAL | @ 14:00:00 | Stop: 2024-07-19

## 2024-07-19 MED ADMIN — tacrolimus (PROGRAF) capsule 1 mg: 1 mg | ORAL | @ 14:00:00 | Stop: 2024-07-19

## 2024-07-19 MED ADMIN — HYDROmorphone (DILAUDID) injection Syrg 0.5 mg: .5 mg | INTRAVENOUS | @ 01:00:00 | Stop: 2024-07-24

## 2024-07-19 NOTE — Discharge Summary (Addendum)
 Physician Discharge Summary [facility]  4 BT[facility]  [address]  [place] [address]  Dept: [phone]  Loc: [phone]     Identifying Information:   Richard [name]  09/25/1965  [id number]    Primary Care Physician: Jama Mayo, MD     Code Status: Full Code    Admit Date: 07/17/2024    Discharge Date: 07/19/2024     Discharge To: Home    Discharge Service: [facility]BR - [facility]: Hospitalist [facility]     Discharge Attending Physician: Marga Skeens, MD    Discharge Diagnoses:  Active Problems:    * No active hospital problems. *  Resolved Problems:    * No resolved hospital problems. *      Outpatient Provider Follow Up Issues:   Follow up with HD as outpatient    Hospital Course:   Richard [name] is a 59 y.o. 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      Chest pain, mild troponin elevation, episodes of lightheadedness: patient had an episode of chest pain.  He is on anticoagulation at baseline and stable on room air making pulmonary embolism unlikely.  Description of pain does not sound consistent with aortic dissection.  It is possible that combination of hypertension and volume overload is precipitating episode.  He does not make urine.  Did consult cardiology considering risk factors for significant CAD and somewhat equivocal stress test in May 2024.  Cardiology discussed with the patient and decided for medical management for consideration of cath if symptoms are ongoing.  In setting of recent echocardiogram last hospitalization, will not order repeat at this time. Post dialysis pt had an episode of severe chest pain.  CT aortic dissection protocol negative.  He had no acute changes on ECG.  Mild elevation in trop in setting of ESRD which normalized.  We discussed with cardiology consult and appreciate recommendations and to follow-up outpatient and consider cath if symptoms ongoing.  He was monitored on telemetry and there was no evidence of arrhthymias or pauses.  He was started on a trial of carvedilol  3.125 per cardiology's recommendation but was discontinued due to hypotension post dialysis.  He will continue aspirin  and apixaban  and he was restarted on atorvastatin  40 mg nightly (unclear why stopped)    Nuclear stress May 2024   Low risk study  - No significant ischemia noted  - There is a moderate sized, mild in severity, fixed perfusion defect involving the mid anterior, apical anterior and apical segments. This is consistent with possible artifact (motion and misregistration of attenuation CT, attenuation) but, cannot rule out subtle scar.  - Left ventricular systolic function is normal. Post stress the ejection fraction is > 60%.  - Mild coronary calcifications are noted     Abdominal pain exacerbated by exertion, fluid shifts umbilical hernia-received extensive evaluation last hospitalization for this.  Evaluation has included several CTs of the abdomen and pelvis, CT mesenteric ischemia protocol, EGD in late July, surgery evaluation.  Surgery saw him last admission and did not recommend surgery for hernia considering small size, lack of bowel involvement and comorbid conditions.  So far, there is not a clear etiology of the symptoms.  Does report symptoms  are exacerbated by stress.  The fact that the pain is not worsened by food intake makes mesenteric ischemia less likely. SIBO felt less likely since patient is on chronic rifaximin .  Overall,since symptoms are stable and improving this admission recently received an extensive evaluation last admissionand  will not pursue further abdominal imaging at this time as long as remains clinically stable without changes in abd exam. His Lactate came back elevated at 3.6 on admission. Per review has had some chronic lactate elevations in the past.  Lactate normalized without intervention. In the setting of severe chest pain with concurrent abdominal pain and elevated lactate  CTA aortic dissection protocol obtained and was negative this admission.  His pain was controlled with IV hydromorphone  per his request as it is the only thing that work for him.  We encouraged limiting opioid pain medications in the setting of chronic pain     Splenic artery aneurysm, possible stent occlusion  - underwent recent stenting of 2.6 cm hilar splenic artery aneurysm on 12/29.  Last CT scan on 113 showed lack of opacification of the stent concerning for possible stent occlusion.  Discussed with radiology and per radiology no need for intervention since there is still adequate perfusion of the spleen and the psuedoaneurysm appears completely thrombosed and excluded   - continue aspirin /apixiban      Lactic acidosis-after admission lactate came back elevated at 3.6 after HD.  Per review had some mildly elevated lactate last hospitalization (2.1-3.3).  Wonder if this is a response of the body with poor tolerance to HD where the shunting of fluids during HD causes relative hypoperfusion to other tissues leading to this phenomenon.  - blood cultures were negative  - lactate normalized  - CTA aortic dissection negative     [name] Cirrhosis s/p OLT (02/2012)  Established with [facility] (Dr. Lennette). No history of rejection or biliary issues. however has had multiple prior admissions for hepatic encephalopathy 01/2024 MR elastography with stage 3 fibrosis.  - Continue tacrolimus  0.5mg  at bedtime and 1 mg qam (goal 2-4)   - Continue cellcept  250mg  BID  - Continue ursodiol  600mg  BID   - Continue rifaximin  BID     ESRD on iHD TTS - Hyperkalemia   Etiology of ESRD 2/2 CNI toxicity and HTN.  Is currently dialyzing through left tunnel internal jugular  iHD line.  Has AV fistula but increased swelling  around site after infiltration events and now dialyzing through tunneled line. PVL duplex earlier in January showed patent fistula but cannulation remain challenging due to edema last hospitalization. Had repeat duplex of the arm given hx of increased swelling around fistula site and the US  showed Patent AVF with velocity elevations at the area of narrowing.  There was no fluid collection noted at the patient area of concern.  Nephrology consulted for HD management and pt was dialyzed on Friday and Sat  - continue sevelemer 800mg  TID   - continue vitamin D supplement      T2DM  Patient reports sliding scale for both his long acting and meal time insulin .  Not adequately controlled at baseline with last hemoglobin A1c of 10.6 (04/2024) Reports more recently he takes about 30 units of long-acting insulin , between 10 to 30 units with meals based on blood sugars, mounjaro  15mg  weekly.  He was treated with Basal: 20U lantus  and  10U lispro with meals in addition to correctional sliding scale     - Incidentally noted on the attenuation CT scan are  several small calcified nodules likely representing sequelae of previous granulomatous disease     Procedures:  No admission procedures for hospital encounter.  ______________________________________________________________________  Discharge Medications:     Your Medication List        START taking these medications      HYDROmorphone  2 MG tablet  Commonly known as: DILAUDID   Take 1 tablet (2 mg total) by mouth every eight (8) hours as needed for moderate pain for up to 5 days.            CONTINUE taking these medications      apixaban  5 mg Tab  Commonly known as: ELIQUIS   Take 1 tablet (5 mg total) by mouth two (2) times a day.     aspirin  81 MG chewable tablet  Chew 1 tablet (81 mg total) daily. Stop taking after 30 days (when you finish this bottle).     blood-glucose meter kit  Use as directed to check blood sugars     CONTOUR NEXT EZ METER Misc  Generic drug: blood-glucose meter  Use as instructed     calcitriol  0.5 MCG capsule  Commonly known as: ROCALTROL   Take 1 capsule (0.5 mcg total) by mouth 3 (three) times a week in dialysis (for use in dialysis).     CONTOUR NEXT TEST STRIPS Strp  Generic drug: blood sugar diagnostic  Use to check blood sugar as directed with insulin  3 times a day and for symptoms of high or low blood sugar.     DEXCOM G6 RECEIVER Misc  Generic drug: blood-glucose,receiver,cont  1 each by Miscellaneous route in the morning. Dispense 1 receiver annually.  Sent to [facility].     DEXCOM G7 SENSOR Devi  Generic drug: blood-glucose sensor  Use to continuously monitor blood glucose.  Change sensor every 10 days.     gabapentin  300 MG capsule  Commonly known as: NEURONTIN   Take 1 capsule (300 mg total) by mouth nightly.     MICROLET LANCET Misc  Generic drug: lancets  Use to check blood sugar as directed with insulin  3 times a day and for symptoms of high or low blood sugar.     MOUNJARO  15 mg/0.5 mL Pnij  Generic drug: tirzepatide   Inject 15 mg under the skin every seven (7) days.     mycophenolate  250 mg capsule  Commonly known as: CELLCEPT   Take 1 capsule (250 mg total) by mouth two (2) times a day.     NovoLOG  Flexpen U-100 Insulin  100 unit/mL (3 mL) injection pen  Generic drug: insulin  aspart  Inject 0.1-0.15 mL (10-15 Units total) under the skin Three (3) times a day before meals.     pantoprazole  40 MG tablet  Commonly known as: Protonix   Take 1 tablet (40 mg total) by mouth daily as needed.     PROGRAF  0.5 mg capsule  Generic drug: tacrolimus   Take 1 capsule (0.5 mg total) by mouth two (2) times a day.     rifAXIMin  550 mg Tab  Commonly known as: XIFAXAN   Take 1 tablet (550 mg total) by mouth two (2) times a day.     sevelamer  800 mg tablet  Commonly known as: RENVELA   Take 3 tablets (2,400 mg total) by mouth Three (3) times a day with a meal.     TRUEPLUS PEN NEEDLE 32 gauge x 5/32 (4 mm) Ndle  Generic drug: pen needle, diabetic  Use with insulin  up to 4 times a day as needed.  TRUEPLUS PEN NEEDLE 32 gauge x 5/32 (4 mm) Ndle  Generic drug: pen needle, diabetic  Use with insulin  up to 4 times daily as needed.     ursodiol  300 mg capsule  Commonly known as: ACTIGALL   Take 2 capsules (600 mg total) by mouth two (2) times a day.     VITAMIN D3 ORAL  Take 2,000 Units by mouth 3 (three) times a week in dialysis.            ASK your doctor about these medications      insulin  degludec 200 unit/mL (3 mL) Inpn  Commonly known as: TRESIBA  FLEXTOUCH U-200  Inject 0.35 mL (70 Units total) under the skin daily. Max dose of 100 units per day.     If fasting glucose is greater than 180, increase basal insulin  by 5 units. Recheck fasting glucose the following morning and continue to increase by 5 units daily until fasting sugar less than 180.  Ask about: Should I take this medication?              Allergies:  Patient has no known allergies.  ______________________________________________________________________  Pending Test Results (if blank, then none):  Pending Labs       Order Current Status    Tacrolimus  Level, Trough In process    Blood Culture Preliminary result    Blood Culture, Adult Preliminary result            Most Recent Labs:  All lab results last 24 hours -   Recent Results (from the past 24 hours)   POCT Glucose    Collection Time: 07/18/24  6:53 PM   Result Value Ref Range    Glucose, POC 171 70 - 179 mg/dL   POCT Glucose    Collection Time: 07/18/24 10:18 PM   Result Value Ref Range    Glucose, POC 208 (H) 70 - 179 mg/dL   POCT Glucose    Collection Time: 07/18/24 11:01 PM   Result Value Ref Range    Glucose, POC 199 (H) 70 - 179 mg/dL   POCT Glucose    Collection Time: 07/19/24  6:50 AM   Result Value Ref Range    Glucose, POC 186 (H) 70 - 179 mg/dL   POCT Glucose    Collection Time: 07/19/24 11:28 AM   Result Value Ref Range    Glucose, POC 241 (H) 70 - 179 mg/dL       Relevant Studies/Radiology (if blank, then none):  PVL Hemodialysis Graft/Fistula Duplex  Result Date: 07/18/2024    Peripheral Vascular Lab     [address]   [place], [place] [address]  PVL HEMODIALYSIS GRAFT-FISTULA DUPLEX Patient Demographics Pt. Name: Richard [name] Location: [facility] MRN:      [id number]         Sex:      M DOB:      04-23-1966         Age:      59 years  Study Information Authorizing         [id number] ANN MARIE      Performed Time       07/18/2024 Provider Name       KUMFER                                     8:27:06 AM Ordering Physician  ANN MARIE KUMFER  Patient Location     [facility] Accession Number    [id number] UN        Technologist         Vernell Gainer RVT,                                                                RDMS Diagnosis:                                Assisting                                           Technologist Ordered Reason For Exam: swelling around fistula site  Reason For Exam = Focal swelling proximal forearm to [facility] fossa/area of AVF anastomosis. Access site = Right Upper Extremity. Access Type = basilic vein transposition. Hx of failed right brachiocephalic AVF; right brachiobasilic AVF creation with VasQ device 02/20/2024; right brachiobasilic AVF revision 03/31/2024.  Final Interpretation Patent AVF with velocity elevations at the area of narrowing _ no fluid collection noted at the patient area of concern - See details below.  Electronically signed by [id number] Almeda Dolores MD on 07/18/2024 at 9:47:31 AM.  Examination Protocol: The dialysis access is interrogated using Doppler ultrasound. B-mode imaging and color Doppler are used to evaluate for diameters, depths, velocities, flow volumes, and abnormalities.When arterial steal is suspected by ordering physician, PW Doppler signals are evaluated at the wrist and PPG tracings are obtained from the digits. In the presence of retrograde flow at the wrist or nonpulsatile PPG tracings, dialysis access may be momentarily occluded to evaluate hemodynamic changes. In the presence of inadaquate fistula maturation, the afferant arteries and efferent veins are evaluated for obstruction. In the presence of suspected venous hypertension, the central veins are evaluated for stenosis.  Findings: +----------------------------+--------+-------+--------+-----------+ -Brachio-cephalic/basilic AVF-Diameter-Depth  -PSV     -Flow Volume- +----------------------------+--------+-------+--------+-----------+ -Distal Brachial Artery      -7.6 mm  -9.8 mm -104 cm/s-1274 ml/min- +----------------------------+--------+-------+--------+-----------+ -AVF anastomosis             -5.6 mm  -14.7 mm-144 cm/s-           - +----------------------------+--------+-------+--------+-----------+ -AVF distal upper arm        -2.1 mm  -2.9 mm -684 cm/s-652 ml/min - +----------------------------+--------+-------+--------+-----------+ -AVF mid upper arm           -6.5 mm  -2.3 mm -188 cm/s-597 ml/min - +----------------------------+--------+-------+--------+-----------+ -AVF prox upper arm          -7.9 mm  -3.3 mm -94 cm/s -642 ml/min - +----------------------------+--------+-------+--------+-----------+  The right brachiobasilic AVF appears patent. As noted on prior examination 07/09/2024, the outflow basilic vein in the distal upper arm appears small in caliber with an AP diameter of approximately 2.15mm. Velocity elevation of 684cm/s with PST and a VR of 2.7 are noted at the vessel narrowing. See above for measurements and flow volumes. No evidence of a focal collection noted at patient area of concern.   Final     ECG 12 Lead  Result Date: 07/18/2024  UNUSUAL P AXIS, POSSIBLE ECTOPIC ATRIAL RHYTHM RIGHT BUNDLE BRANCH BLOCK  SEPTAL INFARCT  , AGE UNDETERMINED ABNORMAL ECG WHEN COMPARED WITH ECG OF 17-Jul-2024 09:04, NO SIGNIFICANT CHANGE WAS FOUND Confirmed by Sheng, Siyuan [phone]) on 07/18/2024 8:59:28 AM    CTA Aortic Dissection  Result Date: 07/18/2024  EXAM: CTA Chest, Abdomen, Pelvis for Aortic Dissection DATE: 07/17/2024 6:42 PM ACCESSION: [id number] UN DICTATED: 07/17/2024 7:11 PM INTERPRETATION LOCATION: MAIN CAMPUS CLINICAL INDICATION: 59 years old Male with chest pain severe  COMPARISON: CT abdomen pelvis 07/09/2024, 08/29/2021 TECHNIQUE: A helical CT of the chest was obtained without IV contrast from the thoracic inlet through the hemidiaphragms. Images were reconstructed in the axial plane. Next, a spiral CTA  of the chest, abdomen and pelvis was obtained with IV contrast from the thoracic inlet through the aortic bifurcation. Images were reconstructed in the axial plane.  Multiplanar reformatted and MIP images are provided for further evaluation of the aorta. FINDINGS: CHEST: AORTA: Ectasia of the ascending thoracic aorta at the level of the pulmonary arteries measuring 3.8 cm (MPR). Aortic valve calcifications. No thoracic aortic intramural hematoma.  No aortic dissection. Aberrant right subclavian artery is noted. HEART/VASCULATURE: Cardiac chambers are normal in size. No pericardial effusion. Main pulmonary artery is normal in size. LUNGS/AIRWAYS/PLEURA: Trachea and large airways are patent. Lingular, left lower lobe, and right upper lobe calcified granulomas. No pleural effusion or pneumothorax. MEDIASTINUM/THORACIC INLET: No enlarged thoracic lymph nodes. No mediastinal hematoma. No thyroid abnormality. ABDOMEN and PELVIS: HEPATOBILIARY: Liver transplantation with unchanged underlaying contour along the anterior aspect of the liver. No focal hepatic lesions. The gallbladder is surgically absent. Common bile duct dilation to 1.5 cm which favored to be in the postcholecystectomy state and similar to prior examinations. SPLEEN: Normal in size and contour. PANCREAS: Normal pancreatic contour. No focal masses. No ductal dilation. ADRENALS: Normal in size and appearance. KIDNEYS/URETERS: Atrophic kidneys bilaterally. No hydronephrosis. No solid renal mass. Nonobstructive left nephrolithiasis. BLADDER: Unremarkable. PELVIC/REPRODUCTIVE ORGANS: Unremarkable. GI TRACT: No dilated or thick walled loops of bowel. The appendix is normal. Colonic diverticulosis. PERITONEUM/RETROPERITONEUM AND MESENTERY: No free air or fluid. 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 There is also a porta hepatis lymph node conglomerate (6:282 measuring up to 1.6 cm in short axis. This conglomerates have been stable in size and appearance to back to 2023. BONES: Mild multilevel degenerative changes of the spine. No aggressive ossesous lesions. SOFT TISSUES: Fat-containing of umbilical hernia with adjacent stranding, similar to prior examinations. VASCULAR: There is calcification and thrombosis noted within the distal aspect of the splenic artery with distal reconstitution just proximal to the splenic hilum (6:302). Fusiform ectasia of the left common iliac artery to 1.9 cm (10:68).     No evidence of acute aortic syndrome. Ectasia of the ascending thoracic aorta to 3.8 cm. Consider interval imaging follow-up to ensure stability in 6 months.    ECG 12 Lead  Result Date: 07/17/2024  PROBABLE SINUS RHYTHM WITH 1ST DEGREE AV BLOCK RIGHT BUNDLE BRANCH BLOCK ABNORMAL ECG WHEN COMPARED WITH ECG OF 17-Jul-2024 06:16, NO SIGNIFICANT CHANGE WAS FOUND Confirmed by Lennie Cough [phone]) on 07/17/2024 10:32:33 PM    ECG 12 Lead  Result Date: 07/17/2024  PROBABLE SINUS RHYTHM WITH 1ST DEGREE AV BLOCK LEFT AXIS DEVIATION RIGHT BUNDLE BRANCH BLOCK POSSIBLE LATERAL INFARCT ABNORMAL ECG WHEN COMPARED WITH ECG OF 10-Jul-2024 15:09, NO SIGNIFICANT CHANGE WAS FOUND Confirmed by Lennie Cough [phone]) on 07/17/2024 4:41:47 PM    XR  Chest 2 views  Result Date: 07/17/2024  EXAM: XR CHEST 2 VIEWS ACCESSION: [id number] UN REPORT DATE: 07/17/2024 7:36 AM CLINICAL INDICATION: CHEST PAIN  TECHNIQUE: PA and Lateral Chest Radiographs COMPARISON: Chest radiograph 07/08/2024 FINDINGS: Left internal jugular approach central venous catheter with tip projecting over the right atrium. Pulmonary vascular congestion without overt pulmonary edema. Lungs are clear.  No pleural effusion or pneumothorax. Cardiac silhouette is normal in size. Thoracic aorta with calcifications.     No acute cardiopulmonary abnormalities.    ______________________________________________________________________  Discharge Instructions:   Activity Instructions       Activity as tolerated                  Other Instructions       Call MD for:  difficulty breathing, headache or visual disturbances      Call MD for:  persistent dizziness or light-headedness      Call MD for:  persistent nausea or vomiting      Call MD for:  severe uncontrolled pain      Call MD for: Temperature > 38.5 Celsius ( > 101.3 Fahrenheit)      Discharge instructions      Discharge Instructions for Chest Pain        You have had an evaluation of your chest pain. Tests including EKG (heart tracings), chest x-ray, blood tests and potentially an inpatient stress test indicate that you are at low risk of having a heart problem in the near future.      Common causes of pain like this include pain from the muscles of the chest, soreness of the chest wall, anxiety and stress-related pain. It can also be linked to discomfort from the stomach, gall bladder or other abdominal problem.      Evaluation at this time is preliminary and follow up with your physician is essential.      Stress tests are excellent screening tools for blockages in the arteries to the heart. There is no test that is perfect for detecting heart disease. It is crucial that your doctor see you in follow-up to decide if any further testing is needed.      It is important that you return to the Emergency Department if you develop any one of the following:          Increased or recurring chest pain or pain that radiates to the arm, neck or abdomen Chest pain with sweating          Severe dizziness or weakness          Shortness of breath or coughing up blood          Severe back or abdominal pain or vomiting          Blood in your stool or black stools          Fever      After your visit, get plenty of rest and avoid any activity that brings on the pain. Please do not smoke or drink alcohol until your symptoms are completely better. Make an appointment to see your doctor within 1-2 weeks of discharge.      You can decrease your chance of developing heart disease in the future if you:          Stop smoking. For help in stopping smoking, the Tobacco Quit Line at 1-877-270-STOP.          Control your cholesterol.          Control your  blood pressure.          Get regular physical activity, if approved by your doctor.          Maintain a healthy weight. Patients who are overweight can decrease their chance of developing diabetes and high blood pressure by losing weight, also.      Your doctor should be consulted for help with these recommendations            Follow Up instructions and Outpatient Referrals     Call MD for:  difficulty breathing, headache or visual disturbances      Call MD for:  persistent dizziness or light-headedness      Call MD for:  persistent nausea or vomiting      Call MD for:  severe uncontrolled pain      Call MD for: Temperature > 38.5 Celsius ( > 101.3 Fahrenheit)      Discharge instructions          Appointments which have been scheduled for you      Jul 29, 2024 9:40 AM  (Arrive by 9:25 AM)  CTA ABDOMEN W WO CONTRAST with IC CT RM 2  RAD [facility]) [address] [facility] [address]  [address]st Floor  [place] [address]  [phone]   On appt date:  Drink lots of water 24 hrs  Bring recent lab work  Take meds as usual  [employer] service fast streamer of current meds  Bring snack if diabetic    Let us  know if pt:  Allergic to contrast dyes  Diabetic  Pregnant or nursing  Claustrophobic         Jul 29, 2024 10:20 AM  (Arrive by 9:50 AM)  RETURN GENERAL with Gerard Levorn Gaskins, MD  [facility] V[facility] CLIN[facility] [address] [facility]) [address] [place] [address]  [place] [address]  [phone]        Aug 08, 2024 1:00 PM  (Arrive by 12:45 PM)  RETURN CARDIOLOGY with Earla Maude Currier, MD  [facility] [address] [facility] REGION) [address]  [address]  [place] [address]  [phone]        Aug 19, 2024 11:00 AM  (Arrive by 10:45 AM)  RETURN HEPATOLOGY with Vena Lowers, MD  [facility] [place] [facility] REGION) [address]  [facility] [address]  [place] [address]  [phone]        Sep 25, 2024 2:15 PM  (Arrive by 2:00 PM)  NEW GENERAL PCP with Juliene Ceil Rogue, MD  [facility] REGION) [address]  [address]  [place] [address]  [phone]        Oct 09, 2024 12:00 PM  (Arrive by 11:30 AM)  NEW  GENERAL SURGERY with GEN AND ACUTE ATTENDING  [facility] GENERAL AND ACUTE CARE SURGERY [facility] REGION) [address]  [place] [address]  [phone]             ______________________________________________________________________  Discharge Day Services:  BP 112/89  - Pulse 100  - Temp 35.8 ??C (96.5 ??F) (Temporal)  - Resp 18  - Ht 177.8 cm (5' 10)  - Wt (!) 112.4 kg (247 lb 12.8 oz)  - SpO2 100%  - BMI 35.56 kg/m??   Pt seen on the day of discharge and determined appropriate for discharge.  Condition at Discharge: good    Length of Discharge: I spent greater than 30 mins in the discharge of this patient.

## 2024-07-19 NOTE — Plan of Care (Incomplete)
 Patient was discharged home per MD order. PIV removed, catheter intact.  Discharge instructions explained, patient verbalized understanding. Patient was transported to lobby via wheelchair with all personal belongings.    Problem: Hemodialysis  Goal: Safe, Effective Therapy Delivery  Outcome: Discharged to Home  Goal: Effective Tissue Perfusion  Outcome: Discharged to Home  Goal: Absence of Infection Signs and Symptoms  Outcome: Discharged to Home     Problem: Adult Inpatient Plan of Care  Goal: Absence of Hospital-Acquired Illness or Injury  Outcome: Discharged to Home  Intervention: Identify and Manage Fall Risk  Recent Flowsheet Documentation  Taken 07/19/2024 0730 by Synthia Elda SAUNDERS, RN  Safety Interventions:   fall reduction program maintained   low bed   nonskid shoes/slippers when out of bed  Intervention: Prevent Skin Injury  Recent Flowsheet Documentation  Taken 07/19/2024 1200 by Synthia Elda SAUNDERS, RN  Positioning for Skin: Supine/Back  Taken 07/19/2024 1000 by Synthia Elda SAUNDERS, RN  Positioning for Skin: Supine/Back  Taken 07/19/2024 0730 by Synthia Elda SAUNDERS, RN  Positioning for Skin: Right  Goal: Optimal Comfort and Wellbeing  Outcome: Discharged to Home  Goal: Readiness for Transition of Care  Outcome: Discharged to Home  Goal: Rounds/Family Conference  Outcome: Discharged to Home     Problem: Fall Injury Risk  Goal: Absence of Fall and Fall-Related Injury  Outcome: Discharged to Home  Intervention: Promote Injury-Free Environment  Recent Flowsheet Documentation  Taken 07/19/2024 0730 by Synthia Elda SAUNDERS, RN  Safety Interventions:   fall reduction program maintained   low bed   nonskid shoes/slippers when out of bed     Problem: Wound  Goal: Optimal Coping  Outcome: Discharged to Home  Goal: Optimal Functional Ability  Outcome: Discharged to Home  Intervention: Optimize Functional Ability  Recent Flowsheet Documentation  Taken 07/19/2024 1200 by Synthia Elda SAUNDERS, RN  Activity Management: dangle, side of bed  Taken 07/19/2024 1000 by Synthia Elda SAUNDERS, RN  Activity Management: dangle, side of bed  Taken 07/19/2024 0730 by Synthia Elda SAUNDERS, RN  Activity Management: in bed  Goal: Absence of Infection Signs and Symptoms  Outcome: Discharged to Home  Goal: Improved Oral Intake  Outcome: Discharged to Home  Goal: Optimal Pain Control and Function  Outcome: Discharged to Home  Goal: Skin Health and Integrity  Outcome: Discharged to Home  Intervention: Optimize Skin Protection  Recent Flowsheet Documentation  Taken 07/19/2024 1200 by Synthia Elda SAUNDERS, RN  Activity Management: dangle, side of bed  Pressure Reduction Techniques: frequent weight shift encouraged  Taken 07/19/2024 1000 by Synthia Elda SAUNDERS, RN  Activity Management: dangle, side of bed  Pressure Reduction Techniques: frequent weight shift encouraged  Taken 07/19/2024 0730 by Georgina Krist R, RN  Activity Management: in bed  Pressure Reduction Techniques: frequent weight shift encouraged  Goal: Optimal Wound Healing  Outcome: Discharged to Home

## 2024-07-19 NOTE — Plan of Care (Signed)
 Patient reported persistent pain throughout the shift, unrelieved by prescribed pain medication. Patient also experienced low blood pressure; the physician was notified and ordered an IV fluid bolus. Repeat blood pressure readings were within normal limits. Patient reported not having slept for the past two days, with only intermittent sleep achieved between administration of pain medication and melatonin.  Shift Summary  HYDROmorphone  was administered PRN several times for pain management, but pain scores increased to 10 later in the shift.   Melatonin was given PRN to support sleep, and patient was observed sleeping during the night.   Carvedilol  was refused due to borderline blood pressure, and Prograf  was administered as scheduled.   Frequent weight shifts, positioning supports, and fall reduction interventions were maintained, with no falls or transfers reported.   Patient remained in bed throughout the shift, with ongoing pain and scattered bruising noted.     Optimal Comfort and Wellbeing: Non-pharmacologic interventions such as music, emotional support, and environmental modifications were provided throughout the shift, and melatonin was administered for sleep; patient was noted to be sleeping at 3:18 AM.     Absence of Fall and Fall-Related Injury: Fall reduction program was maintained, hourly visual checks were performed, and patient remained in bed with no transfers; no fall events occurred during the shift.     Optimal Pain Control and Function: Pain was chronic and constant throughout the shift, with pain scores fluctuating between 5 and 10; HYDROmorphone  was administered PRN multiple times, and non-pharmacologic interventions were used, but pain increased to 10 by 2:18 AM.     Skin Health and Integrity: Braden score remained high at 21, frequent weight shifts and positioning supports were utilized, and skin assessment noted scattered bruising with exceptions to WDL.

## 2024-07-21 DIAGNOSIS — Z09 Encounter for follow-up examination after completed treatment for conditions other than malignant neoplasm: Principal | ICD-10-CM

## 2024-07-28 NOTE — Progress Notes (Signed)
 Curtistine LITTIE Fried has been contacted in regards to their refill of mycophenolate  250 mg capsule (CELLCEPT ) and tacrolimus : PROGRAF  0.5 mg capsule. At this time, they have declined refill due to still having medication on hand. I asked the patient when would be a good time to call back to schedule the refill and he said that he will call us  when he needs it. Refill assessment call date has been updated per the patient's request.

## 2024-07-29 ENCOUNTER — Inpatient Hospital Stay: Admit: 2024-07-29 | Discharge: 2024-07-29 | Payer: BLUE CROSS/BLUE SHIELD

## 2024-07-29 MED ADMIN — iohexol (OMNIPAQUE) 350 mg iodine/mL solution 100 mL: 100 mL | INTRAVENOUS | @ 21:00:00 | Stop: 2024-07-29

## 2024-07-29 NOTE — Procedures (Signed)
 The Venous Access Team (VAT) received a request for PIV access.  Access obtained by staff.      Thank you,     Idonna Heeren Leonce, RN Venous Access Team
